# Patient Record
Sex: Female | Born: 1950
Health system: Southern US, Community
[De-identification: ages and names within clinical notes are randomized; demographics above are authoritative.]

## PROBLEM LIST (undated history)

## (undated) DIAGNOSIS — G4730 Sleep apnea, unspecified: Secondary | ICD-10-CM

## (undated) DIAGNOSIS — M1990 Unspecified osteoarthritis, unspecified site: Secondary | ICD-10-CM

## (undated) DIAGNOSIS — K760 Fatty (change of) liver, not elsewhere classified: Secondary | ICD-10-CM

## (undated) DIAGNOSIS — T1491XA Suicide attempt, initial encounter: Secondary | ICD-10-CM

## (undated) DIAGNOSIS — R55 Syncope and collapse: Secondary | ICD-10-CM

## (undated) DIAGNOSIS — E669 Obesity, unspecified: Secondary | ICD-10-CM

## (undated) DIAGNOSIS — M549 Dorsalgia, unspecified: Secondary | ICD-10-CM

## (undated) DIAGNOSIS — I10 Essential (primary) hypertension: Secondary | ICD-10-CM

## (undated) DIAGNOSIS — E119 Type 2 diabetes mellitus without complications: Secondary | ICD-10-CM

## (undated) DIAGNOSIS — C50919 Malignant neoplasm of unspecified site of unspecified female breast: Secondary | ICD-10-CM

## (undated) DIAGNOSIS — J449 Chronic obstructive pulmonary disease, unspecified: Secondary | ICD-10-CM

## (undated) DIAGNOSIS — J984 Other disorders of lung: Secondary | ICD-10-CM

## (undated) DIAGNOSIS — R7309 Other abnormal glucose: Secondary | ICD-10-CM

## (undated) DIAGNOSIS — H9209 Otalgia, unspecified ear: Secondary | ICD-10-CM

## (undated) DIAGNOSIS — C7800 Secondary malignant neoplasm of unspecified lung: Secondary | ICD-10-CM

## (undated) HISTORY — DX: Essential (primary) hypertension: I10

## (undated) HISTORY — PX: CARDIAC CATHETERIZATION: SHX172

## (undated) HISTORY — PX: CHOLECYSTECTOMY: SHX55

## (undated) HISTORY — DX: Malignant neoplasm of unspecified site of unspecified female breast: C50.919

## (undated) HISTORY — PX: TUBAL LIGATION: SHX77

## (undated) HISTORY — DX: Other abnormal glucose: R73.09

## (undated) HISTORY — DX: Suicide attempt, initial encounter: T14.91XA

## (undated) HISTORY — DX: Dorsalgia, unspecified: M54.9

## (undated) HISTORY — DX: Sleep apnea, unspecified: G47.30

## (undated) HISTORY — DX: Obesity, unspecified: E66.9

## (undated) HISTORY — DX: Other disorders of lung: J98.4

## (undated) HISTORY — DX: Fatty (change of) liver, not elsewhere classified: K76.0

## (undated) HISTORY — DX: Syncope and collapse: R55

## (undated) HISTORY — DX: Type 2 diabetes mellitus without complications: E11.9

## (undated) HISTORY — DX: Unspecified osteoarthritis, unspecified site: M19.90

---

## 1994-08-18 DIAGNOSIS — T1491XA Suicide attempt, initial encounter: Secondary | ICD-10-CM

## 1994-08-18 HISTORY — DX: Suicide attempt, initial encounter: T14.91XA

## 1997-11-16 ENCOUNTER — Encounter: Admission: RE | Admit: 1997-11-16 | Discharge: 1997-11-16 | Payer: Self-pay | Admitting: Family Medicine

## 1997-12-13 ENCOUNTER — Encounter: Admission: RE | Admit: 1997-12-13 | Discharge: 1997-12-13 | Payer: Self-pay | Admitting: Family Medicine

## 1998-01-25 ENCOUNTER — Encounter: Admission: RE | Admit: 1998-01-25 | Discharge: 1998-01-25 | Payer: Self-pay | Admitting: Sports Medicine

## 1998-02-09 ENCOUNTER — Encounter: Admission: RE | Admit: 1998-02-09 | Discharge: 1998-02-09 | Payer: Self-pay | Admitting: Family Medicine

## 1998-03-15 ENCOUNTER — Encounter: Admission: RE | Admit: 1998-03-15 | Discharge: 1998-03-15 | Payer: Self-pay | Admitting: Family Medicine

## 1998-03-16 ENCOUNTER — Other Ambulatory Visit: Admission: RE | Admit: 1998-03-16 | Discharge: 1998-03-16 | Payer: Self-pay | Admitting: *Deleted

## 1998-03-16 ENCOUNTER — Encounter: Admission: RE | Admit: 1998-03-16 | Discharge: 1998-03-16 | Payer: Self-pay | Admitting: Family Medicine

## 1998-03-30 ENCOUNTER — Encounter: Admission: RE | Admit: 1998-03-30 | Discharge: 1998-03-30 | Payer: Self-pay | Admitting: Family Medicine

## 1998-04-06 ENCOUNTER — Encounter: Admission: RE | Admit: 1998-04-06 | Discharge: 1998-04-06 | Payer: Self-pay | Admitting: Family Medicine

## 1998-04-16 ENCOUNTER — Encounter: Admission: RE | Admit: 1998-04-16 | Discharge: 1998-04-16 | Payer: Self-pay | Admitting: Family Medicine

## 1998-04-25 ENCOUNTER — Encounter: Admission: RE | Admit: 1998-04-25 | Discharge: 1998-04-25 | Payer: Self-pay | Admitting: Family Medicine

## 1998-07-11 ENCOUNTER — Encounter: Admission: RE | Admit: 1998-07-11 | Discharge: 1998-07-11 | Payer: Self-pay | Admitting: Family Medicine

## 1998-07-17 ENCOUNTER — Encounter: Admission: RE | Admit: 1998-07-17 | Discharge: 1998-07-17 | Payer: Self-pay | Admitting: Sports Medicine

## 1998-07-27 ENCOUNTER — Encounter: Admission: RE | Admit: 1998-07-27 | Discharge: 1998-07-27 | Payer: Self-pay | Admitting: Family Medicine

## 1998-11-05 ENCOUNTER — Encounter: Admission: RE | Admit: 1998-11-05 | Discharge: 1998-11-05 | Payer: Self-pay | Admitting: Sports Medicine

## 1998-11-19 ENCOUNTER — Encounter: Admission: RE | Admit: 1998-11-19 | Discharge: 1998-11-19 | Payer: Self-pay | Admitting: Family Medicine

## 1998-12-26 ENCOUNTER — Emergency Department (HOSPITAL_COMMUNITY): Admission: EM | Admit: 1998-12-26 | Discharge: 1998-12-26 | Payer: Self-pay

## 1999-03-26 ENCOUNTER — Encounter: Admission: RE | Admit: 1999-03-26 | Discharge: 1999-03-26 | Payer: Self-pay | Admitting: Family Medicine

## 1999-04-03 ENCOUNTER — Ambulatory Visit (HOSPITAL_COMMUNITY): Admission: RE | Admit: 1999-04-03 | Discharge: 1999-04-03 | Payer: Self-pay | Admitting: *Deleted

## 1999-05-03 ENCOUNTER — Emergency Department (HOSPITAL_COMMUNITY): Admission: EM | Admit: 1999-05-03 | Discharge: 1999-05-03 | Payer: Self-pay | Admitting: Emergency Medicine

## 2000-05-28 ENCOUNTER — Encounter: Payer: Self-pay | Admitting: Internal Medicine

## 2000-05-28 ENCOUNTER — Encounter: Admission: RE | Admit: 2000-05-28 | Discharge: 2000-05-28 | Payer: Self-pay | Admitting: Internal Medicine

## 2000-06-23 ENCOUNTER — Encounter: Payer: Self-pay | Admitting: Neurosurgery

## 2000-06-23 ENCOUNTER — Ambulatory Visit (HOSPITAL_COMMUNITY): Admission: RE | Admit: 2000-06-23 | Discharge: 2000-06-23 | Payer: Self-pay | Admitting: Neurosurgery

## 2000-07-30 ENCOUNTER — Ambulatory Visit (HOSPITAL_COMMUNITY): Admission: RE | Admit: 2000-07-30 | Discharge: 2000-07-30 | Payer: Self-pay | Admitting: Neurosurgery

## 2000-12-02 ENCOUNTER — Encounter: Admission: RE | Admit: 2000-12-02 | Discharge: 2001-03-02 | Payer: Self-pay | Admitting: Neurology

## 2000-12-19 ENCOUNTER — Emergency Department (HOSPITAL_COMMUNITY): Admission: EM | Admit: 2000-12-19 | Discharge: 2000-12-19 | Payer: Self-pay | Admitting: Emergency Medicine

## 2001-03-08 ENCOUNTER — Encounter: Admission: RE | Admit: 2001-03-08 | Discharge: 2001-06-06 | Payer: Self-pay | Admitting: Neurology

## 2001-03-11 ENCOUNTER — Inpatient Hospital Stay (HOSPITAL_COMMUNITY): Admission: AD | Admit: 2001-03-11 | Discharge: 2001-03-11 | Payer: Self-pay | Admitting: Obstetrics & Gynecology

## 2001-03-15 ENCOUNTER — Encounter: Payer: Self-pay | Admitting: Obstetrics & Gynecology

## 2001-03-15 ENCOUNTER — Encounter: Admission: RE | Admit: 2001-03-15 | Discharge: 2001-03-15 | Payer: Self-pay | Admitting: Obstetrics & Gynecology

## 2001-03-19 ENCOUNTER — Encounter: Admission: RE | Admit: 2001-03-19 | Discharge: 2001-03-19 | Payer: Self-pay | Admitting: General Surgery

## 2001-03-19 ENCOUNTER — Encounter: Payer: Self-pay | Admitting: General Surgery

## 2001-03-23 ENCOUNTER — Ambulatory Visit (HOSPITAL_BASED_OUTPATIENT_CLINIC_OR_DEPARTMENT_OTHER): Admission: RE | Admit: 2001-03-23 | Discharge: 2001-03-23 | Payer: Self-pay | Admitting: General Surgery

## 2001-03-23 ENCOUNTER — Encounter (INDEPENDENT_AMBULATORY_CARE_PROVIDER_SITE_OTHER): Payer: Self-pay | Admitting: *Deleted

## 2001-06-04 ENCOUNTER — Encounter: Payer: Self-pay | Admitting: Emergency Medicine

## 2001-06-04 ENCOUNTER — Emergency Department (HOSPITAL_COMMUNITY): Admission: EM | Admit: 2001-06-04 | Discharge: 2001-06-04 | Payer: Self-pay | Admitting: Emergency Medicine

## 2001-06-28 ENCOUNTER — Other Ambulatory Visit: Admission: RE | Admit: 2001-06-28 | Discharge: 2001-06-28 | Payer: Self-pay | Admitting: *Deleted

## 2001-10-05 ENCOUNTER — Emergency Department (HOSPITAL_COMMUNITY): Admission: EM | Admit: 2001-10-05 | Discharge: 2001-10-05 | Payer: Self-pay | Admitting: *Deleted

## 2001-10-05 ENCOUNTER — Encounter: Payer: Self-pay | Admitting: *Deleted

## 2001-10-11 ENCOUNTER — Encounter: Payer: Self-pay | Admitting: Internal Medicine

## 2001-10-12 ENCOUNTER — Observation Stay (HOSPITAL_COMMUNITY): Admission: RE | Admit: 2001-10-12 | Discharge: 2001-10-12 | Payer: Self-pay | Admitting: Internal Medicine

## 2001-10-14 ENCOUNTER — Inpatient Hospital Stay (HOSPITAL_COMMUNITY): Admission: EM | Admit: 2001-10-14 | Discharge: 2001-10-16 | Payer: Self-pay | Admitting: Emergency Medicine

## 2001-10-14 ENCOUNTER — Encounter: Payer: Self-pay | Admitting: Emergency Medicine

## 2001-10-21 ENCOUNTER — Encounter: Admission: RE | Admit: 2001-10-21 | Discharge: 2001-10-21 | Payer: Self-pay

## 2001-10-25 ENCOUNTER — Encounter: Admission: RE | Admit: 2001-10-25 | Discharge: 2001-10-25 | Payer: Self-pay | Admitting: Cardiology

## 2001-10-27 ENCOUNTER — Ambulatory Visit (HOSPITAL_COMMUNITY): Admission: RE | Admit: 2001-10-27 | Discharge: 2001-10-27 | Payer: Self-pay

## 2001-10-28 ENCOUNTER — Emergency Department (HOSPITAL_COMMUNITY): Admission: EM | Admit: 2001-10-28 | Discharge: 2001-10-28 | Payer: Self-pay | Admitting: Emergency Medicine

## 2001-10-29 ENCOUNTER — Ambulatory Visit (HOSPITAL_COMMUNITY): Admission: RE | Admit: 2001-10-29 | Discharge: 2001-10-29 | Payer: Self-pay | Admitting: Internal Medicine

## 2001-10-29 ENCOUNTER — Encounter: Payer: Self-pay | Admitting: Internal Medicine

## 2001-10-29 ENCOUNTER — Encounter: Admission: RE | Admit: 2001-10-29 | Discharge: 2001-10-29 | Payer: Self-pay | Admitting: Internal Medicine

## 2001-11-01 ENCOUNTER — Ambulatory Visit (HOSPITAL_BASED_OUTPATIENT_CLINIC_OR_DEPARTMENT_OTHER): Admission: RE | Admit: 2001-11-01 | Discharge: 2001-11-01 | Payer: Self-pay | Admitting: Internal Medicine

## 2001-11-03 ENCOUNTER — Encounter: Admission: RE | Admit: 2001-11-03 | Discharge: 2001-11-03 | Payer: Self-pay | Admitting: Internal Medicine

## 2001-11-08 ENCOUNTER — Encounter: Admission: RE | Admit: 2001-11-08 | Discharge: 2001-11-08 | Payer: Self-pay | Admitting: Internal Medicine

## 2001-11-15 ENCOUNTER — Encounter: Payer: Self-pay | Admitting: Emergency Medicine

## 2001-11-15 ENCOUNTER — Emergency Department (HOSPITAL_COMMUNITY): Admission: EM | Admit: 2001-11-15 | Discharge: 2001-11-16 | Payer: Self-pay | Admitting: Emergency Medicine

## 2001-11-23 ENCOUNTER — Ambulatory Visit (HOSPITAL_COMMUNITY): Admission: RE | Admit: 2001-11-23 | Discharge: 2001-11-23 | Payer: Self-pay | Admitting: Otolaryngology

## 2001-11-23 ENCOUNTER — Encounter: Payer: Self-pay | Admitting: Otolaryngology

## 2001-11-24 ENCOUNTER — Encounter: Admission: RE | Admit: 2001-11-24 | Discharge: 2001-11-24 | Payer: Self-pay

## 2001-11-29 ENCOUNTER — Encounter: Admission: RE | Admit: 2001-11-29 | Discharge: 2001-11-29 | Payer: Self-pay | Admitting: *Deleted

## 2001-12-14 ENCOUNTER — Encounter: Admission: RE | Admit: 2001-12-14 | Discharge: 2001-12-14 | Payer: Self-pay

## 2001-12-16 ENCOUNTER — Encounter: Payer: Self-pay | Admitting: Internal Medicine

## 2001-12-16 ENCOUNTER — Ambulatory Visit (HOSPITAL_COMMUNITY): Admission: RE | Admit: 2001-12-16 | Discharge: 2001-12-16 | Payer: Self-pay | Admitting: Internal Medicine

## 2002-01-16 DIAGNOSIS — K760 Fatty (change of) liver, not elsewhere classified: Secondary | ICD-10-CM

## 2002-01-16 HISTORY — DX: Fatty (change of) liver, not elsewhere classified: K76.0

## 2002-01-26 ENCOUNTER — Encounter: Admission: RE | Admit: 2002-01-26 | Discharge: 2002-01-26 | Payer: Self-pay | Admitting: Internal Medicine

## 2002-01-28 ENCOUNTER — Encounter: Payer: Self-pay | Admitting: Internal Medicine

## 2002-01-28 ENCOUNTER — Ambulatory Visit (HOSPITAL_COMMUNITY): Admission: RE | Admit: 2002-01-28 | Discharge: 2002-01-28 | Payer: Self-pay | Admitting: Internal Medicine

## 2002-02-11 ENCOUNTER — Encounter: Admission: RE | Admit: 2002-02-11 | Discharge: 2002-02-11 | Payer: Self-pay | Admitting: Family Medicine

## 2002-02-28 ENCOUNTER — Encounter: Admission: RE | Admit: 2002-02-28 | Discharge: 2002-02-28 | Payer: Self-pay | Admitting: Family Medicine

## 2002-03-17 ENCOUNTER — Encounter: Admission: RE | Admit: 2002-03-17 | Discharge: 2002-03-17 | Payer: Self-pay | Admitting: Family Medicine

## 2002-03-22 ENCOUNTER — Encounter: Admission: RE | Admit: 2002-03-22 | Discharge: 2002-03-22 | Payer: Self-pay | Admitting: Family Medicine

## 2002-04-05 ENCOUNTER — Encounter: Admission: RE | Admit: 2002-04-05 | Discharge: 2002-04-05 | Payer: Self-pay | Admitting: Family Medicine

## 2002-04-24 ENCOUNTER — Emergency Department (HOSPITAL_COMMUNITY): Admission: EM | Admit: 2002-04-24 | Discharge: 2002-04-24 | Payer: Self-pay | Admitting: Emergency Medicine

## 2002-04-26 ENCOUNTER — Encounter: Admission: RE | Admit: 2002-04-26 | Discharge: 2002-04-26 | Payer: Self-pay | Admitting: *Deleted

## 2002-04-27 ENCOUNTER — Encounter: Admission: RE | Admit: 2002-04-27 | Discharge: 2002-04-27 | Payer: Self-pay | Admitting: Family Medicine

## 2002-05-09 ENCOUNTER — Encounter (INDEPENDENT_AMBULATORY_CARE_PROVIDER_SITE_OTHER): Payer: Self-pay | Admitting: Specialist

## 2002-05-09 ENCOUNTER — Ambulatory Visit (HOSPITAL_COMMUNITY): Admission: RE | Admit: 2002-05-09 | Discharge: 2002-05-09 | Payer: Self-pay | Admitting: Obstetrics and Gynecology

## 2002-05-13 ENCOUNTER — Encounter: Admission: RE | Admit: 2002-05-13 | Discharge: 2002-05-13 | Payer: Self-pay | Admitting: Family Medicine

## 2002-05-24 ENCOUNTER — Encounter: Admission: RE | Admit: 2002-05-24 | Discharge: 2002-05-24 | Payer: Self-pay | Admitting: Sports Medicine

## 2002-05-26 ENCOUNTER — Encounter: Admission: RE | Admit: 2002-05-26 | Discharge: 2002-05-26 | Payer: Self-pay | Admitting: Obstetrics and Gynecology

## 2002-06-03 ENCOUNTER — Encounter: Admission: RE | Admit: 2002-06-03 | Discharge: 2002-06-03 | Payer: Self-pay | Admitting: Family Medicine

## 2002-07-04 ENCOUNTER — Encounter: Admission: RE | Admit: 2002-07-04 | Discharge: 2002-07-04 | Payer: Self-pay | Admitting: Family Medicine

## 2002-07-25 ENCOUNTER — Encounter: Admission: RE | Admit: 2002-07-25 | Discharge: 2002-10-23 | Payer: Self-pay

## 2002-07-31 ENCOUNTER — Encounter: Admission: RE | Admit: 2002-07-31 | Discharge: 2002-07-31 | Payer: Self-pay | Admitting: Family Medicine

## 2002-08-22 ENCOUNTER — Encounter: Admission: RE | Admit: 2002-08-22 | Discharge: 2002-08-22 | Payer: Self-pay | Admitting: Family Medicine

## 2002-10-13 ENCOUNTER — Encounter: Admission: RE | Admit: 2002-10-13 | Discharge: 2002-10-13 | Payer: Self-pay | Admitting: Family Medicine

## 2002-11-09 ENCOUNTER — Ambulatory Visit (HOSPITAL_BASED_OUTPATIENT_CLINIC_OR_DEPARTMENT_OTHER): Admission: RE | Admit: 2002-11-09 | Discharge: 2002-11-09 | Payer: Self-pay | Admitting: *Deleted

## 2002-12-07 ENCOUNTER — Encounter: Admission: RE | Admit: 2002-12-07 | Discharge: 2002-12-07 | Payer: Self-pay | Admitting: Family Medicine

## 2002-12-08 ENCOUNTER — Encounter: Admission: RE | Admit: 2002-12-08 | Discharge: 2002-12-08 | Payer: Self-pay | Admitting: Family Medicine

## 2003-01-06 ENCOUNTER — Encounter: Admission: RE | Admit: 2003-01-06 | Discharge: 2003-01-06 | Payer: Self-pay | Admitting: Family Medicine

## 2003-01-30 ENCOUNTER — Encounter: Admission: RE | Admit: 2003-01-30 | Discharge: 2003-01-30 | Payer: Self-pay | Admitting: Family Medicine

## 2003-03-02 ENCOUNTER — Encounter: Admission: RE | Admit: 2003-03-02 | Discharge: 2003-03-02 | Payer: Self-pay | Admitting: Sports Medicine

## 2003-03-02 ENCOUNTER — Encounter: Admission: RE | Admit: 2003-03-02 | Discharge: 2003-03-02 | Payer: Self-pay | Admitting: Family Medicine

## 2003-03-02 ENCOUNTER — Encounter: Payer: Self-pay | Admitting: Sports Medicine

## 2003-03-18 ENCOUNTER — Emergency Department (HOSPITAL_COMMUNITY): Admission: EM | Admit: 2003-03-18 | Discharge: 2003-03-18 | Payer: Self-pay | Admitting: Emergency Medicine

## 2003-03-30 ENCOUNTER — Encounter: Admission: RE | Admit: 2003-03-30 | Discharge: 2003-03-30 | Payer: Self-pay | Admitting: Family Medicine

## 2003-03-31 ENCOUNTER — Encounter (INDEPENDENT_AMBULATORY_CARE_PROVIDER_SITE_OTHER): Payer: Self-pay

## 2003-03-31 ENCOUNTER — Encounter (INDEPENDENT_AMBULATORY_CARE_PROVIDER_SITE_OTHER): Payer: Self-pay | Admitting: *Deleted

## 2003-03-31 ENCOUNTER — Other Ambulatory Visit: Admission: RE | Admit: 2003-03-31 | Discharge: 2003-03-31 | Payer: Self-pay | Admitting: Family Medicine

## 2003-03-31 ENCOUNTER — Encounter: Admission: RE | Admit: 2003-03-31 | Discharge: 2003-03-31 | Payer: Self-pay | Admitting: Family Medicine

## 2003-04-27 ENCOUNTER — Encounter: Admission: RE | Admit: 2003-04-27 | Discharge: 2003-04-27 | Payer: Self-pay | Admitting: Sports Medicine

## 2003-05-04 ENCOUNTER — Encounter: Admission: RE | Admit: 2003-05-04 | Discharge: 2003-05-04 | Payer: Self-pay | Admitting: Family Medicine

## 2003-06-01 ENCOUNTER — Encounter: Admission: RE | Admit: 2003-06-01 | Discharge: 2003-06-01 | Payer: Self-pay | Admitting: Sports Medicine

## 2003-06-19 ENCOUNTER — Encounter: Admission: RE | Admit: 2003-06-19 | Discharge: 2003-06-19 | Payer: Self-pay | Admitting: Family Medicine

## 2003-07-06 ENCOUNTER — Encounter: Admission: RE | Admit: 2003-07-06 | Discharge: 2003-07-06 | Payer: Self-pay | Admitting: Family Medicine

## 2003-09-02 ENCOUNTER — Emergency Department (HOSPITAL_COMMUNITY): Admission: EM | Admit: 2003-09-02 | Discharge: 2003-09-02 | Payer: Self-pay | Admitting: Emergency Medicine

## 2003-09-11 ENCOUNTER — Emergency Department (HOSPITAL_COMMUNITY): Admission: EM | Admit: 2003-09-11 | Discharge: 2003-09-12 | Payer: Self-pay | Admitting: Emergency Medicine

## 2003-09-28 ENCOUNTER — Encounter: Admission: RE | Admit: 2003-09-28 | Discharge: 2003-09-28 | Payer: Self-pay | Admitting: Sports Medicine

## 2003-10-26 ENCOUNTER — Encounter: Admission: RE | Admit: 2003-10-26 | Discharge: 2003-10-26 | Payer: Self-pay | Admitting: Family Medicine

## 2003-11-23 ENCOUNTER — Encounter: Admission: RE | Admit: 2003-11-23 | Discharge: 2003-11-23 | Payer: Self-pay | Admitting: Family Medicine

## 2003-12-28 ENCOUNTER — Encounter: Admission: RE | Admit: 2003-12-28 | Discharge: 2003-12-28 | Payer: Self-pay | Admitting: Sports Medicine

## 2004-01-29 ENCOUNTER — Emergency Department (HOSPITAL_COMMUNITY): Admission: EM | Admit: 2004-01-29 | Discharge: 2004-01-30 | Payer: Self-pay

## 2004-02-28 ENCOUNTER — Encounter: Admission: RE | Admit: 2004-02-28 | Discharge: 2004-02-28 | Payer: Self-pay | Admitting: Family Medicine

## 2004-02-28 ENCOUNTER — Ambulatory Visit (HOSPITAL_COMMUNITY): Admission: RE | Admit: 2004-02-28 | Discharge: 2004-02-28 | Payer: Self-pay | Admitting: Family Medicine

## 2004-04-12 ENCOUNTER — Encounter: Payer: Self-pay | Admitting: Cardiology

## 2004-04-18 ENCOUNTER — Ambulatory Visit: Payer: Self-pay | Admitting: Family Medicine

## 2004-06-27 ENCOUNTER — Ambulatory Visit: Payer: Self-pay | Admitting: Family Medicine

## 2004-09-26 ENCOUNTER — Ambulatory Visit: Payer: Self-pay | Admitting: Family Medicine

## 2004-10-16 ENCOUNTER — Encounter (INDEPENDENT_AMBULATORY_CARE_PROVIDER_SITE_OTHER): Payer: Self-pay | Admitting: *Deleted

## 2004-10-16 LAB — CONVERTED CEMR LAB

## 2004-10-18 ENCOUNTER — Encounter: Admission: RE | Admit: 2004-10-18 | Discharge: 2004-10-18 | Payer: Self-pay | Admitting: Family Medicine

## 2004-11-15 ENCOUNTER — Ambulatory Visit: Payer: Self-pay | Admitting: Family Medicine

## 2004-11-15 ENCOUNTER — Encounter (INDEPENDENT_AMBULATORY_CARE_PROVIDER_SITE_OTHER): Payer: Self-pay | Admitting: *Deleted

## 2004-11-21 ENCOUNTER — Ambulatory Visit: Payer: Self-pay | Admitting: Family Medicine

## 2004-11-21 ENCOUNTER — Encounter: Admission: RE | Admit: 2004-11-21 | Discharge: 2004-11-21 | Payer: Self-pay | Admitting: Family Medicine

## 2004-12-12 ENCOUNTER — Ambulatory Visit: Payer: Self-pay | Admitting: Family Medicine

## 2005-01-09 ENCOUNTER — Ambulatory Visit: Payer: Self-pay | Admitting: Family Medicine

## 2005-02-20 ENCOUNTER — Ambulatory Visit: Payer: Self-pay | Admitting: Family Medicine

## 2005-03-06 ENCOUNTER — Ambulatory Visit: Payer: Self-pay | Admitting: Family Medicine

## 2005-03-27 ENCOUNTER — Ambulatory Visit: Payer: Self-pay | Admitting: Family Medicine

## 2005-04-17 ENCOUNTER — Ambulatory Visit: Payer: Self-pay | Admitting: Family Medicine

## 2005-05-08 ENCOUNTER — Ambulatory Visit: Payer: Self-pay | Admitting: Family Medicine

## 2005-05-22 ENCOUNTER — Ambulatory Visit: Payer: Self-pay | Admitting: Family Medicine

## 2005-05-29 ENCOUNTER — Ambulatory Visit: Payer: Self-pay | Admitting: Family Medicine

## 2005-07-20 ENCOUNTER — Emergency Department (HOSPITAL_COMMUNITY): Admission: AD | Admit: 2005-07-20 | Discharge: 2005-07-20 | Payer: Self-pay | Admitting: Emergency Medicine

## 2005-09-04 ENCOUNTER — Ambulatory Visit: Payer: Self-pay | Admitting: Family Medicine

## 2005-10-16 ENCOUNTER — Ambulatory Visit: Payer: Self-pay | Admitting: Family Medicine

## 2005-11-06 ENCOUNTER — Ambulatory Visit: Payer: Self-pay | Admitting: Family Medicine

## 2005-12-02 ENCOUNTER — Ambulatory Visit: Payer: Self-pay | Admitting: Cardiology

## 2005-12-03 ENCOUNTER — Ambulatory Visit: Payer: Self-pay | Admitting: Family Medicine

## 2005-12-03 ENCOUNTER — Inpatient Hospital Stay (HOSPITAL_COMMUNITY): Admission: EM | Admit: 2005-12-03 | Discharge: 2005-12-04 | Payer: Self-pay | Admitting: Emergency Medicine

## 2005-12-03 ENCOUNTER — Encounter: Payer: Self-pay | Admitting: Cardiovascular Disease

## 2005-12-18 ENCOUNTER — Ambulatory Visit: Payer: Self-pay | Admitting: Family Medicine

## 2006-01-22 ENCOUNTER — Ambulatory Visit: Payer: Self-pay | Admitting: Family Medicine

## 2006-03-12 ENCOUNTER — Ambulatory Visit: Payer: Self-pay | Admitting: Family Medicine

## 2006-05-14 ENCOUNTER — Ambulatory Visit: Payer: Self-pay | Admitting: Family Medicine

## 2006-05-27 ENCOUNTER — Encounter: Admission: RE | Admit: 2006-05-27 | Discharge: 2006-05-27 | Payer: Self-pay | Admitting: Sports Medicine

## 2006-07-16 ENCOUNTER — Ambulatory Visit: Payer: Self-pay | Admitting: Family Medicine

## 2006-09-10 ENCOUNTER — Ambulatory Visit: Payer: Self-pay | Admitting: Family Medicine

## 2006-09-10 LAB — CONVERTED CEMR LAB
ALT: 68 units/L — ABNORMAL HIGH (ref 0–35)
AST: 33 units/L (ref 0–37)
Albumin: 4.2 g/dL (ref 3.5–5.2)
Alkaline Phosphatase: 87 units/L (ref 39–117)
BUN: 15 mg/dL (ref 6–23)
CO2: 27 meq/L (ref 19–32)
Calcium: 9.6 mg/dL (ref 8.4–10.5)
Chloride: 102 meq/L (ref 96–112)
Creatinine, Ser: 0.86 mg/dL (ref 0.40–1.20)
Glucose, Bld: 106 mg/dL — ABNORMAL HIGH (ref 70–99)
Potassium: 4.3 meq/L (ref 3.5–5.3)
Sodium: 141 meq/L (ref 135–145)
Total Bilirubin: 0.3 mg/dL (ref 0.3–1.2)
Total Protein: 7.3 g/dL (ref 6.0–8.3)

## 2006-10-15 ENCOUNTER — Ambulatory Visit: Payer: Self-pay | Admitting: Family Medicine

## 2006-10-15 DIAGNOSIS — G4730 Sleep apnea, unspecified: Secondary | ICD-10-CM | POA: Insufficient documentation

## 2006-10-15 DIAGNOSIS — F329 Major depressive disorder, single episode, unspecified: Secondary | ICD-10-CM | POA: Insufficient documentation

## 2006-10-15 DIAGNOSIS — F603 Borderline personality disorder: Secondary | ICD-10-CM | POA: Insufficient documentation

## 2006-10-15 DIAGNOSIS — M48061 Spinal stenosis, lumbar region without neurogenic claudication: Secondary | ICD-10-CM | POA: Insufficient documentation

## 2006-10-15 DIAGNOSIS — F411 Generalized anxiety disorder: Secondary | ICD-10-CM | POA: Insufficient documentation

## 2006-10-15 DIAGNOSIS — I10 Essential (primary) hypertension: Secondary | ICD-10-CM | POA: Insufficient documentation

## 2006-10-15 DIAGNOSIS — M479 Spondylosis, unspecified: Secondary | ICD-10-CM | POA: Insufficient documentation

## 2006-10-15 DIAGNOSIS — K219 Gastro-esophageal reflux disease without esophagitis: Secondary | ICD-10-CM | POA: Insufficient documentation

## 2006-10-16 ENCOUNTER — Encounter (INDEPENDENT_AMBULATORY_CARE_PROVIDER_SITE_OTHER): Payer: Self-pay | Admitting: *Deleted

## 2006-11-11 ENCOUNTER — Telehealth: Payer: Self-pay | Admitting: *Deleted

## 2006-11-16 ENCOUNTER — Telehealth (INDEPENDENT_AMBULATORY_CARE_PROVIDER_SITE_OTHER): Payer: Self-pay | Admitting: *Deleted

## 2006-12-24 ENCOUNTER — Ambulatory Visit: Payer: Self-pay | Admitting: Family Medicine

## 2006-12-24 DIAGNOSIS — R7401 Elevation of levels of liver transaminase levels: Secondary | ICD-10-CM | POA: Insufficient documentation

## 2006-12-24 DIAGNOSIS — R740 Nonspecific elevation of levels of transaminase and lactic acid dehydrogenase [LDH]: Secondary | ICD-10-CM

## 2006-12-24 DIAGNOSIS — R7402 Elevation of levels of lactic acid dehydrogenase (LDH): Secondary | ICD-10-CM | POA: Insufficient documentation

## 2007-02-04 ENCOUNTER — Encounter: Payer: Self-pay | Admitting: Family Medicine

## 2007-02-18 ENCOUNTER — Ambulatory Visit: Payer: Self-pay | Admitting: Family Medicine

## 2007-02-18 LAB — CONVERTED CEMR LAB
ALT: 47 units/L — ABNORMAL HIGH (ref 0–35)
AST: 26 units/L (ref 0–37)
Albumin: 4.4 g/dL (ref 3.5–5.2)
Alkaline Phosphatase: 96 units/L (ref 39–117)
BUN: 20 mg/dL (ref 6–23)
CO2: 27 meq/L (ref 19–32)
Calcium: 9.9 mg/dL (ref 8.4–10.5)
Chloride: 104 meq/L (ref 96–112)
Creatinine, Ser: 0.97 mg/dL (ref 0.40–1.20)
Glucose, Bld: 100 mg/dL — ABNORMAL HIGH (ref 70–99)
HCT: 46 % (ref 36.0–46.0)
Hemoglobin: 15.2 g/dL — ABNORMAL HIGH (ref 12.0–15.0)
MCHC: 33 g/dL (ref 30.0–36.0)
MCV: 88.8 fL (ref 78.0–100.0)
Platelets: 354 10*3/uL (ref 150–400)
Potassium: 4.2 meq/L (ref 3.5–5.3)
RBC: 5.18 M/uL — ABNORMAL HIGH (ref 3.87–5.11)
RDW: 14.8 % — ABNORMAL HIGH (ref 11.5–14.0)
Sodium: 139 meq/L (ref 135–145)
Total Bilirubin: 0.4 mg/dL (ref 0.3–1.2)
Total Protein: 7.7 g/dL (ref 6.0–8.3)
WBC: 7.1 10*3/uL (ref 4.0–10.5)

## 2007-03-24 ENCOUNTER — Telehealth: Payer: Self-pay | Admitting: Family Medicine

## 2007-03-25 ENCOUNTER — Telehealth: Payer: Self-pay | Admitting: *Deleted

## 2007-04-22 ENCOUNTER — Encounter: Payer: Self-pay | Admitting: Family Medicine

## 2007-04-27 ENCOUNTER — Ambulatory Visit: Payer: Self-pay | Admitting: Family Medicine

## 2007-04-28 ENCOUNTER — Encounter: Payer: Self-pay | Admitting: Family Medicine

## 2007-04-29 ENCOUNTER — Telehealth: Payer: Self-pay | Admitting: *Deleted

## 2007-05-05 ENCOUNTER — Telehealth: Payer: Self-pay | Admitting: Family Medicine

## 2007-05-27 ENCOUNTER — Ambulatory Visit: Payer: Self-pay | Admitting: Family Medicine

## 2007-05-27 LAB — CONVERTED CEMR LAB
ALT: 53 units/L — ABNORMAL HIGH (ref 0–35)
AST: 26 units/L (ref 0–37)
Albumin: 4.2 g/dL (ref 3.5–5.2)
Alkaline Phosphatase: 79 units/L (ref 39–117)
BUN: 20 mg/dL (ref 6–23)
CO2: 28 meq/L (ref 19–32)
Calcium: 9.6 mg/dL (ref 8.4–10.5)
Chloride: 102 meq/L (ref 96–112)
Creatinine, Ser: 0.83 mg/dL (ref 0.40–1.20)
Glucose, Bld: 114 mg/dL — ABNORMAL HIGH (ref 70–99)
Potassium: 4.4 meq/L (ref 3.5–5.3)
Sodium: 142 meq/L (ref 135–145)
Total Bilirubin: 0.4 mg/dL (ref 0.3–1.2)
Total Protein: 7.5 g/dL (ref 6.0–8.3)

## 2007-06-24 ENCOUNTER — Ambulatory Visit: Payer: Self-pay | Admitting: Family Medicine

## 2007-07-22 ENCOUNTER — Ambulatory Visit: Payer: Self-pay | Admitting: Family Medicine

## 2007-07-22 DIAGNOSIS — E669 Obesity, unspecified: Secondary | ICD-10-CM | POA: Insufficient documentation

## 2007-08-26 ENCOUNTER — Ambulatory Visit: Payer: Self-pay | Admitting: Family Medicine

## 2007-09-29 ENCOUNTER — Ambulatory Visit (HOSPITAL_COMMUNITY): Admission: RE | Admit: 2007-09-29 | Discharge: 2007-09-29 | Payer: Self-pay | Admitting: Family Medicine

## 2007-09-30 ENCOUNTER — Ambulatory Visit: Payer: Self-pay | Admitting: Family Medicine

## 2007-09-30 DIAGNOSIS — R55 Syncope and collapse: Secondary | ICD-10-CM | POA: Insufficient documentation

## 2007-09-30 LAB — CONVERTED CEMR LAB
ALT: 48 units/L — ABNORMAL HIGH (ref 0–35)
AST: 26 units/L (ref 0–37)
Albumin: 4.4 g/dL (ref 3.5–5.2)
Alkaline Phosphatase: 86 units/L (ref 39–117)
BUN: 16 mg/dL (ref 6–23)
CO2: 25 meq/L (ref 19–32)
Calcium: 9.6 mg/dL (ref 8.4–10.5)
Chloride: 100 meq/L (ref 96–112)
Creatinine, Ser: 0.87 mg/dL (ref 0.40–1.20)
Glucose, Bld: 104 mg/dL — ABNORMAL HIGH (ref 70–99)
HCT: 43.7 % (ref 36.0–46.0)
Hemoglobin: 14.6 g/dL (ref 12.0–15.0)
MCHC: 33.4 g/dL (ref 30.0–36.0)
MCV: 86.9 fL (ref 78.0–100.0)
Platelets: 320 10*3/uL (ref 150–400)
Potassium: 4.2 meq/L (ref 3.5–5.3)
RBC: 5.03 M/uL (ref 3.87–5.11)
RDW: 14.2 % (ref 11.5–15.5)
Sodium: 139 meq/L (ref 135–145)
Total Bilirubin: 0.5 mg/dL (ref 0.3–1.2)
Total Protein: 7.5 g/dL (ref 6.0–8.3)
WBC: 5.8 10*3/uL (ref 4.0–10.5)

## 2007-10-04 ENCOUNTER — Encounter: Payer: Self-pay | Admitting: Family Medicine

## 2007-10-06 ENCOUNTER — Encounter: Payer: Self-pay | Admitting: Family Medicine

## 2007-10-11 ENCOUNTER — Emergency Department (HOSPITAL_COMMUNITY): Admission: EM | Admit: 2007-10-11 | Discharge: 2007-10-11 | Payer: Self-pay | Admitting: Emergency Medicine

## 2007-10-28 ENCOUNTER — Ambulatory Visit: Payer: Self-pay | Admitting: Family Medicine

## 2007-10-28 LAB — CONVERTED CEMR LAB
Cholesterol: 155 mg/dL (ref 0–200)
HDL: 62 mg/dL (ref >39)
LDL Cholesterol: 72 mg/dL (ref 0–99)
Total CHOL/HDL Ratio: 2.5
Triglycerides: 104 mg/dL (ref <150)
VLDL: 21 mg/dL (ref 0–40)

## 2007-11-25 ENCOUNTER — Ambulatory Visit: Payer: Self-pay | Admitting: Family Medicine

## 2007-11-25 DIAGNOSIS — H9209 Otalgia, unspecified ear: Secondary | ICD-10-CM | POA: Insufficient documentation

## 2007-12-28 ENCOUNTER — Telehealth: Payer: Self-pay | Admitting: *Deleted

## 2007-12-30 ENCOUNTER — Encounter: Payer: Self-pay | Admitting: *Deleted

## 2008-10-30 ENCOUNTER — Encounter: Admission: RE | Admit: 2008-10-30 | Discharge: 2008-10-30 | Payer: Self-pay | Admitting: Internal Medicine

## 2008-11-24 ENCOUNTER — Observation Stay (HOSPITAL_COMMUNITY): Admission: EM | Admit: 2008-11-24 | Discharge: 2008-11-25 | Payer: Self-pay | Admitting: Emergency Medicine

## 2008-11-24 ENCOUNTER — Ambulatory Visit: Payer: Self-pay | Admitting: Cardiology

## 2009-01-29 ENCOUNTER — Encounter
Admission: RE | Admit: 2009-01-29 | Discharge: 2009-01-29 | Payer: Self-pay | Admitting: Physical Medicine & Rehabilitation

## 2009-03-05 ENCOUNTER — Ambulatory Visit: Payer: Self-pay | Admitting: Cardiology

## 2009-03-05 ENCOUNTER — Emergency Department (HOSPITAL_COMMUNITY): Admission: EM | Admit: 2009-03-05 | Discharge: 2009-03-05 | Payer: Self-pay | Admitting: Emergency Medicine

## 2010-09-17 NOTE — Miscellaneous (Signed)
Summary: dnka/ts  Clinical Lists Changes 

## 2010-09-17 NOTE — Assessment & Plan Note (Signed)
Summary: routine visit/el   Vital Signs:  Patient Profile:   60 Years Old Female Height:     64 inches (162.56 cm) Weight:      278 pounds Temp:     98.7 degrees F Pulse rate:   71 / minute BP sitting:   149 / 91  (left arm)  Pt. in pain?   yes    Location:   back and right leg    Intensity:   9    Type:       aching and sharp  Vitals Entered ByJacki Cones RN (October 28, 2007 9:49 AM)                 Last Flex Sig:  Done. (08/19/1995 12:00:00 AM) Flex Sig Next Due:  Not Indicated Last Hemoccult Result: Done. (04/18/2002 12:00:00 AM) Hemoccult Next Due:  Not Indicated   History of Present Illness: Was in ER a week or so ago for back pain.  Better now did not get any by mouth narcotics  OBESITY (ICD-278.00) continues to slowly loose weight.  Has stopped sodas and drinking water and propel.  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  No incontinence or significant change in sensation.  Using morphine two times a day to help with mobility  HYPERTENSION Disease Monitoring   Blood pressure range:Was low when went to ER for back pain last month       Chest pain: N     Dyspnea:N Medications   Compliance: stopped her enalapril because thinks makes her feel chilled.  Is better since she stopped   Lightheadedness: N     Edema:N  ROS - as above PMH - Medications reviewed and updated in medication list.  Smoking Status noted in VS form       Current Allergies: AMOXICILLIN (AMOXICILLIN) ASPIRIN (ASPIRIN) MEPERIDINE HCL (MEPERIDINE HCL)   Social History:    Has boyfriend; adopted granddaughter, [name] (born 8/02); Sisters Douglas and leigh help with angelica; Has daughter Lattie Corns ([name]'s mom-drug abuser); has daughter Engineer, manufacturing (granddaughter Shadow); Daughter Melony killed  1996.  No tobacco, no etoh   Risk Factors:  Tobacco use:  quit    Year quit:  38 years ago    Physical Exam  General:     Well-developed,well-nourished,in no acute distress; alert,appropriate and cooperative throughout examination    Impression & Recommendations:  Problem # 1:  OBESITY (ICD-278.00) Encouraged weight loss with diet as main approach.  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  Discussed weight loss is the main treatment for this.  Will improve as she carries less weight.  No evidence of nerve compromise Orders: [facility]- Est  Level 4 (99214)   Problem # 3:  HYPERTENSION, BENIGN SYSTEMIC (ICD-401.1) Will monitor her BP off enalapril.  Will likely need restart or additon of another agent Her updated medication list for this problem includes:    Hydrochlorothiazide 25 Mg Tabs (Hydrochlorothiazide) .Marland Kitchen... Take 1 tablet by mouth every morning    Enalapril Maleate 10 Mg Tabs (Enalapril maleate) ..... Not taking for now  Orders: [facility]- Est  Level 4 (09811)   Problem # 4:  Preventive Health Care (ICD-V70.0) Discussed need for screening test particularly  colonoscopy since she has rectal bleeding she understands she could have cancer  Complete Medication List: 1)  Hydrochlorothiazide 25 Mg Tabs (Hydrochlorothiazide) .... Take 1 tablet by mouth every morning 2)  Imipramine Hcl 25 Mg Tabs (Imipramine hcl) .... 3 by mouth at bedtime 3)  Omeprazole 20 Mg Cpdr (Omeprazole) .... Take 1 capsule by mouth twice a day 4)  Triamcinolone Acetonide 0.025 % Oint (Triamcinolone acetonide) .... Apply a small amount to affected area twice a day 5)  Enalapril Maleate 10 Mg Tabs (Enalapril maleate) .... Not taking for now 6)  Oramorph Sr 15 Mg Tb12 (Morphine sulfate) .Marland Kitchen.. 1 by mouth two times a day.  should last until 11/28/07  Other Orders: Lip[facility] ([id number])   Patient  Instructions: 1)  Please schedule a follow-up appointment in 1 month. 2)  ***Check your BP 3-4 times a week and bring in the readings 3)  #######You need a Mammogram a Pap Smear and a colonoscopy you could have breast cervical or colon cancer    Prescriptions: ORAMORPH SR 15 MG  TB12 (MORPHINE SULFATE) 1 by mouth two times a day.  Should last until 11/28/07  #60 x 0   Entered and Authorized by:   Pearlean Brownie MD   Signed by:   Pearlean Brownie MD on 10/28/2007   Method used:   Print then Give to Patient   RxID:   [id number]  ]

## 2010-09-17 NOTE — Progress Notes (Signed)
Summary: requesting alternate rx   Phone Note Call from Patient Call back at Home Phone [phone]   Reason for Call: Talk to Nurse Summary of Call: pt is calling re: her prior authorization for oxycotin. Explained to pt this has been sent to [facility] and it sometimes can take a few days for medicaid to approve and send back. Pt wants to know if something else can be called in? She sts shes in a lot of pain and right now shes taking tylenol but she sts its not good for her to take tylenol Initial call taken by: ERIN LEVAN,  May 05, 2007 9:25 AM    New/Updated Medications: ORAMORPH SR 15 MG  TB12 (MORPHINE SULFATE) 1 by mouth three times a day   Prescriptions: ORAMORPH SR 15 MG  TB12 (MORPHINE SULFATE) 1 by mouth three times a day  #90 x 0   Entered and Authorized by:   Pearlean Brownie MD   Signed by:   Pearlean Brownie MD on 05/05/2007   Method used:   Print then Give to Patient   RxID:   [id number]

## 2010-09-17 NOTE — Assessment & Plan Note (Signed)
Summary: fu wp    History of Present Illness: Osteoarthritis CO worse pain especially in R leg.  Will give out on her sometimes when pain is too bad.  If has pain medication will not fall.  Did not injury anything when gave out on her and she fell 2 weeks ago.  No swelling or redness or focal pain States can't exercise to lose weight because of the pain.   Has not seen Dr Jillyn Hidden her orthopedist in several years  Hypertension No cp  or shortness of breath. Does feel lightheaded when stands quickly.  Taking HCTZ without problems  Reviewed the patient's medications.  They are updated and current.   Current Allergies: AMOXICILLIN (AMOXICILLIN) ASPIRIN (ASPIRIN) MEPERIDINE HCL (MEPERIDINE HCL)      Physical Exam  General:     Well-developed,well-nourished,in no acute distress; alert,appropriate and cooperative throughout examination. Msk:     Able to get up from chair and up on exam table without apparent pain and no assistance.  FROM of both hips with mild pain on R.  No effusion either knee.  Non inflamed varicose veins biltat L > R.  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  Will ask her to go back to her orthopedist Did not loose weight as per our agreement.  Will allow one more opportunity to follow up as per patient instructions.  If not will decrease her dosing to two times a day.   Orders: [facility]- Est  Level 4 (62694)   Problem # 2:  HYPERTENSION, BENIGN SYSTEMIC (ICD-401.1) Assessment: Unchanged  Her updated medication list for this problem includes:    Hydrochlorothiazide 25 Mg Tabs (Hydrochlorothiazide) .Marland Kitchen... Take 1 tablet by mouth every morning  Orders: [facility]- Est  Level 4 (85462)   Problem # 3:  Preventive Health Care (ICD-V70.0) Told to  patient and her daughter that she needs a colonoscpy and could have a cancer that would kill her but that we could catch it in time if she had this test.  She will consider it  Complete Medication List: 1)  Hydrochlorothiazide 25 Mg Tabs (Hydrochlorothiazide) .... Take 1 tablet by mouth every morning 2)  Imipramine Hcl 25 Mg Tabs (Imipramine hcl) .... 3 by mouth at bedtime 3)  Omeprazole 20 Mg Cpdr (Omeprazole) .... Take 1 capsule by mouth twice a day 4)  Oxycontin 10 Mg Tb12 (Oxycodone hcl) .... Take 1 tablet by mouth three times a day 5)  Triamcinolone Acetonide 0.025 % Oint (Triamcinolone acetonide) .... Apply a small amount to affected area twice a day   Patient Instructions: 1)  Please schedule a follow-up appointment in 1 month. 2)  See Dr Jillyn Hidden to see if any further treatment for pain 3)  Make appointment for a Pap Smear 4)  Need to have lost 5 lbs by next visit 5)  Youu need a colonoscopy    Prescriptions: OXYCONTIN 10 MG TB12 (OXYCODONE HCL) Take 1 tablet by mouth three times a day  #90 x 0   Entered and Authorized by:   Pearlean Brownie MD   Signed by:   Pearlean Brownie MD on 04/27/2007   Method used:   Print then Give to Patient   RxID:   [id number]  ]

## 2010-09-17 NOTE — Assessment & Plan Note (Signed)
Summary: 1 month fu/el   Vital Signs:  Patient Profile:   60 Years Old Female Height:     64 inches (162.56 cm) Weight:      290.1 pounds Temp:     98.4 degrees F Pulse rate:   62 / minute BP sitting:   149 / 86  Pt. in pain?   yes    Location:   lower back, right leg, H/A    Intensity:   8  Vitals Entered By: Altamese Dilling CMA, (May 27, 2007 8:47 AM)                  Chief Complaint:  1 mo f/u.  History of Present Illness: Osteoarthritis Still pain in hips and back.  No change.  No swelling or redness or focal pain States can't exercise to lose weight because of the pain.  Has not made an appt with Dr Jillyn Hidden her orthopedist   Hypertension No cp  or shortness of breath. Does feel lightheaded when stands quickly.  Taking HCTZ without problems.    Mental Health Not seeing any psychiatrist or counselor.  Did not get along with the last one.  Feels down but no suicidal ideation.  Mostly stress with her family situation and pain.  Is planning to find another MH provider  Reviewed the patient's medications.  They are updated and current.   Current Allergies: AMOXICILLIN (AMOXICILLIN) ASPIRIN (ASPIRIN) MEPERIDINE HCL (MEPERIDINE HCL)      Physical Exam  General:     Well-developed,well-nourished,in no acute distress; alert,appropriate and cooperative throughout examination    Impression & Recommendations:  Problem # 1:  OSTEOARTHRITIS OF SPINE, NOS (ICD-721.90) No change in situation.  Is not losing weight.  Will decrease her morphine to two times a day.  Emphasized weight loss is the key to improving her pain and mobility Orders: [facility]- Est Level  3 (36644)   Problem # 2:  HYPERTENSION, BENIGN SYSTEMIC (ICD-401.1) controlled Her updated medication list for this problem includes:    Hydrochlorothiazide 25 Mg Tabs (Hydrochlorothiazide) .Marland Kitchen... Take 1 tablet by mouth every morning  Orders: [facility] ([id number]) [facility]- Est Level  3  ([id number])   Problem # 3:  DEPRESSIVE DISORDER, NOS (ICD-311) Chronic but relatively stable.  Emphasized needs a regular mental health provider.  Continue imipramine for depression and pain Her updated medication list for this problem includes:    Imipramine Hcl 25 Mg Tabs (Imipramine hcl) .Marland KitchenMarland KitchenMarland KitchenMarland Kitchen 3 by mouth at bedtime   Complete Medication List: 1)  Hydrochlorothiazide 25 Mg Tabs (Hydrochlorothiazide) .... Take 1 tablet by mouth every morning 2)  Imipramine Hcl 25 Mg Tabs (Imipramine hcl) .... 3 by mouth at bedtime 3)  Omeprazole 20 Mg Cpdr (Omeprazole) .... Take 1 capsule by mouth twice a day 4)  Triamcinolone Acetonide 0.025 % Oint (Triamcinolone acetonide) .... Apply a small amount to affected area twice a day 5)  Oramorph Sr 15 Mg Tb12 (Morphine sulfate) .Marland Kitchen.. 1 by mouth two times a day   Patient Instructions: 1)  Please schedule a follow-up appointment in 1 month. 2)  Need follow up with Dr Jillyn Hidden 3)  Find a mental health counselor 4)  I will call you if your lab is abnormal otherwise I will discuss the results during our next visit    Prescriptions: ORAMORPH SR 15 MG  TB12 (MORPHINE SULFATE) 1 by mouth two times a day  #60 x 0   Entered and Authorized by:   Pearlean Brownie MD  Signed by:   Pearlean Brownie MD on 05/27/2007   Method used:   Handwritten   RxID:   [id number]  ]

## 2010-09-17 NOTE — Miscellaneous (Signed)
  Clinical Lists Changes Imipramine refill req was for 25 mg capsules 3 by mouth at bedtime.  Medications: Changed medication from IMIPRAMINE PAMOATE 75 MG CAPS (IMIPRAMINE PAMOATE) Take 1 capsule by mouth every night to IMIPRAMINE HCL 25 MG TABS (IMIPRAMINE HCL) 3 by mouth at bedtime

## 2010-09-17 NOTE — Progress Notes (Signed)
Summary: Refill/appt  Phone Note Call from Patient Call back at Home Phone [phone]   Summary of Call: Pt had to cancel appt for this week stating she has to fly to [place] to be with her daughter (states something is going on)...but she needs a refill on morphine. Initial call taken by: Haydee Salter,  Dec 28, 2007 11:31 AM  Follow-up for Phone Call        Will forward to MD Follow-up by: ASHA BENTON LPN,  Dec 28, 2007 11:47 AM  Additional Follow-up for Phone Call Additional follow up Details #1::        Script placed up front, pt. notified, pt. states that she will be leaving in the morning and that she doesnt know if she will be back by June 12 b/c her daughter was in a car accident and she has to take care of her granddaughter. But if she ends up staying longer she will just go to the ER in there. Additional Follow-up by: ASHA BENTON LPN,  Dec 28, 2007 1:49 PM    New/Updated Medications: ORAMORPH SR 15 MG  TB12 (MORPHINE SULFATE) 1 by mouth two times a day.  Should last until 01/28/08   Prescriptions: ORAMORPH SR 15 MG  TB12 (MORPHINE SULFATE) 1 by mouth two times a day.  Should last until 01/28/08  #60 x 0   Entered and Authorized by:   Pearlean Brownie MD   Signed by:   Pearlean Brownie MD on 12/28/2007   Method used:   Handwritten   RxID:   [id number]

## 2010-09-17 NOTE — Medication Information (Signed)
Summary: Prior Authorization  Prior Authorization   Imported By: Haydee Salter 05/13/2007 15:43:14  _____________________________________________________________________  External Attachment:    Type:   Image     Comment:   External Document

## 2010-09-17 NOTE — Consult Note (Signed)
[facility]   Imported By: Haydee Salter 02/10/2007 13:58:57  _____________________________________________________________________  External Attachment:    Type:   Image     Comment:   External Document

## 2010-09-17 NOTE — Assessment & Plan Note (Signed)
Summary: [name]   Vital Signs:  Patient Profile:   60 Years Old Female Height:     64 inches (162.56 cm) Weight:      296 pounds (134.55 kg) BMI:     50.99 Temp:     98 degrees F (36.67 degrees C) Pulse rate:   68 / minute BP sitting:   144 / 80  (right arm)  Vitals Entered By: Tomasa Rand (June 24, 2007 10:57 AM)                 Chief Complaint:  F/U and meds refills.  History of Present Illness: Osteoarthritis No change in her status still pain in hips and back.  No change.  No swelling or redness or focal pain States can't exercise to lose weight because of the pain.  Has not made an appt with Dr Jillyn Hidden her orthopedist   Obesity Trying to lose weight but hard.  Blames todays weight gain on being off her hctz.  Trying to eat mostly soups and to exercise  Hypertension No cp  or shortness of breath. Did not take her hctz for 2 days - ran out.  Mental Health Not seeing any psychiatrist or counselor.  Did not get along with the last one.  Feels down but no suicidal ideation.  Mostly stress with her family situation and pain.  Has not found another counselor  Reviewed the patient's medications.  They are updated and current.   Current Allergies: AMOXICILLIN (AMOXICILLIN) ASPIRIN (ASPIRIN) MEPERIDINE HCL (MEPERIDINE HCL)      Physical Exam  General:     Well-developed,well-nourished,in no acute distress; alert,appropriate and cooperative throughout examination Msk:     Able to get up and down from exam table easily    Impression & Recommendations:  Problem # 1:  ELEVATION, TRANSAMINASE/LDH LEVELS (ICD-790.4) Assessment: Improved  Problem # 2:  GASTROESOPHAGEAL REFLUX, NO ESOPHAGITIS (ICD-530.81) recommended that she take her PPI daily  Her updated medication list for this problem includes:    Omeprazole 20 Mg Cpdr (Omeprazole) .Marland Kitchen... 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  Will follow Her updated medication list for this problem includes:    Hydrochlorothiazide 25 Mg Tabs (Hydrochlorothiazide) .Marland Kitchen... Take 1 tablet by mouth every morning  Orders: [facility]- Est  Level 4 (60454)   Complete Medication List: 1)  Hydrochlorothiazide 25 Mg Tabs (Hydrochlorothiazide) .... Take 1 tablet by mouth every morning 2)  Imipramine Hcl 25 Mg Tabs (Imipramine hcl) .... 3 by mouth at bedtime 3)  Omeprazole 20 Mg Cpdr (Omeprazole) .... Take 1 capsule by mouth twice a day 4)  Triamcinolone Acetonide 0.025 % Oint (Triamcinolone acetonide) .... Apply a small amount to affected area twice a day 5)  Oramorph Sr 15 Mg Tb12 (Morphine sulfate) .Marland Kitchen.. 1 by mouth two times a day fill after 11/8   Patient Instructions: 1)  Please schedule a follow-up appointment in 1 month. 2)  If have not lost 5 lbs by next visit will decrease your morphine 3)  Get your pap smear and mammogram    Prescriptions: ORAMORPH SR 15 MG  TB12 (MORPHINE SULFATE) 1 by mouth two times a day Fill after 11/8  #60 x 0   Entered and  Authorized by:   Pearlean Brownie MD   Signed by:   Pearlean Brownie MD on 06/24/2007   Method used:   Print then Give to Patient   RxID:   [id number]  ]

## 2010-09-17 NOTE — Assessment & Plan Note (Signed)
Summary: HTN/FU [facility]   Vital Signs:  Patient Profile:   60 Years Old Female Height:     64 inches (162.56 cm) Weight:      282 pounds (128.18 kg) BMI:     48.58 Temp:     98 degrees F (36.67 degrees C) Pulse rate:   73 / minute BP sitting:   154 / 78  (right arm)  Pt. in pain?   no  Vitals Entered By: Tomasa Rand (August 26, 2007 9:55 AM)                  Chief Complaint:  B/P f/u.  History of Present Illness: Osteoarthritis No change in her status still pain in hips and back.  No swelling or redness or focal pain.    Obesity Eating frozen veggies and soups which is helping her lose wt.   Hypertension Taking HCTZ  and now enalapril regularly.  Not measuring at home No chest pain  or shortness of breath.  No lip swelling  Reviewed the patient's medications.  They are updated and current.    Serial Vital Signs/Assessments:  Time      Position  BP       Pulse  Resp  Temp     By                     138/80                         MARSHALL CHAMBLISS MD  Current Allergies: AMOXICILLIN (AMOXICILLIN) ASPIRIN (ASPIRIN) MEPERIDINE HCL (MEPERIDINE HCL)      Physical Exam  General:     Obese ,in no acute distress; alert,appropriate and cooperative throughout examination.  Noted 3 lb weight loss.  Able to get up and down from exam table without problems or apparent pain.    Impression & Recommendations:  Problem # 1:  OSTEOARTHRITIS OF SPINE, NOS (ICD-721.90) Stable on lowered dose of narcotics Orders: [facility]- Est Level  3 (47829)   Problem # 2:  OBESITY (ICD-278.00) slowly losing weight encouraged to continue her approach Orders: [facility]- Est Level  3 (56213)   Problem # 3:  HYPERTENSION, BENIGN SYSTEMIC (ICD-401.1) Assessment: Improved  Her updated medication list for this problem includes:    Hydrochlorothiazide 25 Mg Tabs (Hydrochlorothiazide) .Marland Kitchen... Take 1 tablet by mouth every morning    Enalapril Maleate 10 Mg Tabs (Enalapril maleate) .Marland Kitchen... Take 1  tab by mouth daily  Orders: [facility]- Est Level  3 ([id number])   Complete Medication List: 1)  Hydrochlorothiazide 25 Mg Tabs (Hydrochlorothiazide) .... Take 1 tablet by mouth every morning 2)  Imipramine Hcl 25 Mg Tabs (Imipramine hcl) .... 3 by mouth at bedtime 3)  Omeprazole 20 Mg Cpdr (Omeprazole) .... Take 1 capsule by mouth twice a day 4)  Triamcinolone Acetonide 0.025 % Oint (Triamcinolone acetonide) .... Apply a small amount to affected area twice a day 5)  Enalapril Maleate 10 Mg Tabs (Enalapril maleate) .... Take 1 tab by mouth daily 6)  Oramorph Sr 15 Mg Tb12 (Morphine sulfate) .Marland Kitchen.. 1 by mouth two times a day   Patient Instructions: 1)  Please schedule a follow-up appointment in 1 month. 2)  Check BP whenever have chance  3)  Continue to work on weight loss doing very well.  Aim for 5-10 lbs next visit 4)  Mammogram and Pap smear and Colonscopy - You need all of these to make sure you  don't have cancer    Prescriptions: ORAMORPH SR 15 MG  TB12 (MORPHINE SULFATE) 1 by mouth two times a day  #60 x 0   Entered and Authorized by:   Pearlean Brownie MD   Signed by:   Pearlean Brownie MD on 08/26/2007   Method used:   Print then Give to Patient   RxID:   [id number] ENALAPRIL MALEATE 10 MG  TABS (ENALAPRIL MALEATE) Take 1 tab by mouth daily  #30 x 6   Entered and Authorized by:   Pearlean Brownie MD   Signed by:   Pearlean Brownie MD on 08/26/2007   Method used:   Electronically sent to ...       [facility]  [address] [id number]       [address]       [place], [place]  [address]       Ph: [phone] or [phone]       Fax: [phone]   RxID:   [id number]  ]

## 2010-09-17 NOTE — Assessment & Plan Note (Signed)
Summary: 2 MONTH FU WK   Vital Signs:  Patient Profile:   60 Years Old Female Height:     64 inches (162.56 cm) Weight:      285 pounds (129.55 kg) BMI:     49.10 Pulse rate:   92 / minute BP sitting:   175 / 95  Pt. in pain?   no  Vitals Entered By: Tomasa Rand (Dec 24, 2006 9:02 AM)                Chief Complaint:  F/U visit.  History of Present Illness: Osteoarthritis Continued pain in back and legs.  Is working on diet and trying to exercise.   No jt swelling or redness  Hypertension No cp  or shortness of breath. Does feel lightheaded when stands quickly.  Taking HCTZ without problems  Elevated LFTS  No RUQ pain or jaundice.  Was started on Neurontin by her psychiatrist   Reviewed the patient's medications in Dr Tiajuana Amass.  They are updated and current.     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 Alert and cooperative with normal attention span and concentration. No apparent delusions, illusions, hallucinations    Impression & Recommendations:  Problem # 1:  OSTEOARTHRITIS OF SPINE, NOS (ICD-721.90) Assessment: Unchanged Continue to promote weight loss and exercise.  Reviewed that we are using the oxycontin to promote these and if not loosing weight will decrease dose.  Dr Allyne Gee psychiatry added neurontin.   [facility]- Est  Level 4 (99214)   Problem # 2:  HYPERTENSION,  BENIGN SYSTEMIC (ICD-401.1) Assessment: Deteriorated Seems to fluctuate.  She can't take BP at drug stores because can only take in left arm that is tender.  Will follow.  If still elevated will need to check labs and add another medication Orders: [facility]- Est  Level 4 (99214)   Problem # 3:  ELEVATION, TRANSAMINASE/LDH LEVELS (ICD-790.4) Assessment: Unchanged Will recheck next visit. Not clinically active Orders: [facility]- Est  Level 4 (16109)   Problem # 4:  FAMILY SITUATION (ICD-V61.9) Grand dtr [name] doing well in [place].  Her dtr is pregnant at age 11 is not using drugs any longer.    Problem # 5:  Preventive Health Care (ICD-V70.0) Discussed cancer risks of not getting her screening tests.  Problem # 6:  DEPRESSIVE DISORDER, NOS (ICD-311) Seeing Dr Allyne Gee at [facility] q 2 months   Patient Instructions: 1)  Please schedule a follow-up appointment in 2  months. 2)  Goal is to loose 1-2 lbs a week.  If do not loose 8 lbs by next visit will decrease Oxycontin to three times a day  3)  Remember Mammogram, Pap smear, colonscopy  4)  If have chance check and write down BP

## 2010-09-17 NOTE — Progress Notes (Signed)
Summary: Requesting to speak with Dr Deirdre Priest  Phone Note Call from Patient   Summary of Call: pt is requesting to speak with Dr Deirdre Priest about oxycontin - pt is upset about rx Initial call taken by: Haydee Salter,  March 25, 2007 11:48 AM  Follow-up for Phone Call        pt is calling back, she wants [place] to [facility] to verify with them that they had the rx and that they shredded the rx. She wants to speak with MD Follow-up by: Denny Peon LEVAN,  March 25, 2007 4:31 PM  Additional Follow-up for Phone Call Additional follow up Details #1::        I spoke with Rodman [facility] at [facility] they have the prescription for oxycontin but it is very soiled.    I will write another Rx and patient may pickup tomorrow Additional Follow-up by: Pearlean Brownie MD,  March 25, 2007 5:13 PM    Additional Follow-up for Phone Call Additional follow up Details #2::    Rx has been picked up Follow-up by: Sarah Bush [name] CMA,  March 26, 2007 10:14 AM  New/Updated Medications: OXYCONTIN 10 MG TB12 (OXYCODONE HCL) Take 1 tablet by mouth three times a day

## 2010-09-17 NOTE — Assessment & Plan Note (Signed)
Summary: multiple issues wp   Vital Signs:  Patient Profile:   60 Years Old Female Height:     64 inches (162.56 cm) Weight:      280.8 pounds BMI:     48.37 Temp:     98.4 degrees F oral Pulse rate:   76 / minute BP sitting:   155 / 98  (left arm)  Pt. in pain?   yes    Location:   rt. leg    Intensity:   8  Vitals Entered By: Garen Grams LPN (September 30, 2007 9:53 AM)                  Chief Complaint:  Pain in lower back and rt. leg.  History of Present Illness: Syncope Had episode standing in line at a store.  Felt lightheadedness and had pain in her leg prior to episode.  Was awake and alert in a few moments.  Has had before.  Had cath in 2007 normal.  No increase in chest pain or shortness of breath.  No change in her meds or activity.  No tongue biting or incontinence.  Leg Pain radiates from back continues as usual. Comes and goes.  No specific weakness.  No change in bowel or bladder control.    HYPERTENSION Disease Monitoring   Blood pressure range:  does not take     Chest pain: N     Dyspnea:N Medications   Compliance: taking regularly   Lightheadedness: N     Edema:N Prevention   Exercise: trying to but hard with her pain    Salt restriction:Y  Obesity Lost 2 lbs   ROS - as above PMH - Medications reviewed and updated in medication list.  Smoking Status noted in VS form        Current Allergies: AMOXICILLIN (AMOXICILLIN) ASPIRIN (ASPIRIN) MEPERIDINE HCL (MEPERIDINE HCL)  Past Medical History:    h/o suicide attempt `96,     Mild Fatty liver on U/S 6/03,     Mild Restrictive Lung Disease on PFTs 4/03,     Nl ABIs 8/03,    EGD 8/03 : normal -      Sleep study 4/04 No Apnea -     Echocardiogram 12-03-05 Normal    Cardiac Catheritization 12/04/2005 Dr Juanda Chance - Normal      Physical Exam  General:     Well-developed,well-nourished,in no acute distress; alert,appropriate and cooperative throughout examination Neck:     No deformities,  masses, or tenderness noted. Lungs:     Normal respiratory effort, chest expands symmetrically. Lungs are clear to auscultation, no crackles or wheezes. Heart:     Normal rate and regular rhythm. S1 and S2 normal without gallop, murmur, click, rub or other extra sounds. Abdomen:     Obese.  Mild epig tenderness.  No gaurding or rebound  Neurologic:     No cranial nerve deficits noted. Station and gait are normal. . Sensory, motor and coordinative functions appear intact.    Impression & Recommendations:  Problem # 1:  SYNCOPE (ICD-780.2) Assessment: New No evidence of CV disease.  Likely vasovagal with standing and pain.  Had normal cardiac evaluation in 4/07  Will observe and check labs  Orders: EKG- [facility] (EKG) [facility]- Est  Level 4 (99214) Comp Met-[facility] (14782-95621) CBC-[facility] (30865)   Problem # 2:  HYPERTENSION, BENIGN SYSTEMIC (ICD-401.1) Assessment: Deteriorated Not optimal but will hold off changing medications until blood tests and monitoring  Her updated medication list for  this problem includes:    Hydrochlorothiazide 25 Mg Tabs (Hydrochlorothiazide) .Marland Kitchen... Take 1 tablet by mouth every morning    Enalapril Maleate 10 Mg Tabs (Enalapril maleate) .Marland Kitchen... Take 1 tab by mouth daily   Problem # 3:  OBESITY (ICD-278.00) Will not lower narcotics today but discussed will in the future if weight loss does not continue  Complete Medication List: 1)  Hydrochlorothiazide 25 Mg Tabs (Hydrochlorothiazide) .... Take 1 tablet by mouth every morning 2)  Imipramine Hcl 25 Mg Tabs (Imipramine hcl) .... 3 by mouth at bedtime 3)  Omeprazole 20 Mg Cpdr (Omeprazole) .... Take 1 capsule by mouth twice a day 4)  Triamcinolone Acetonide 0.025 % Oint (Triamcinolone acetonide) .... Apply a small amount to affected area twice a day 5)  Enalapril Maleate 10 Mg Tabs (Enalapril maleate) .... Take 1 tab by mouth daily 6)  Oramorph Sr 15 Mg Tb12 (Morphine sulfate) .Marland Kitchen.. 1 by mouth two times a day.  should last  until 10/28/07   Patient Instructions: 1)  Please schedule a follow-up appointment in 1 month. 2)  If you have chest pain or severe shortness of breath go to the ER. 3)  Check your BP 3-4 times a week and bring in the readings 4)  I will call you if your lab is abnormal otherwise I will discuss the results during our next visit 5)  NO ONE SHOULD SMOKE IN YOUR HOUSE    Prescriptions: ORAMORPH SR 15 MG  TB12 (MORPHINE SULFATE) 1 by mouth two times a day.  Should last until 10/28/07  #60 x 0   Entered and Authorized by:   Pearlean Brownie MD   Signed by:   Pearlean Brownie MD on 09/30/2007   Method used:   Print then Give to Patient   RxID:   [id number]  ]

## 2010-09-17 NOTE — Assessment & Plan Note (Signed)
Summary: FU/KH   Vital Signs:  Patient Profile:   60 Years Old Female Height:     64 inches (162.56 cm) Weight:      285.50 pounds BMI:     49.18 Temp:     99.1 degrees F Pulse rate:   74 / minute BP sitting:   164 / 79  Pt. in pain?   yes    Location:   legs and back    Intensity:   4  Vitals Entered By: Dennison Nancy RN (July 22, 2007 8:46 AM)                  Chief Complaint:  Weight and BP check.  History of Present Illness: Osteoarthritis No change in her status still pain in hips and back.  No swelling or redness or focal pain.  Pain meds are helping "some"  Obesity Eating soups which is helping her loose wt.   Hypertension Taking HCTZ regularly.  Not measuring at home No chest pain  or shortness of breath.   Mental Health Using techniques she has learned relaxation etc but does not want to see counselor.  Her daughter Melene Muller and granddtr are moving and her other daughter may move soon.  This will help with her stress.  The imipramine is helping with sleep  Reviewed the patient's medications.  They are updated and current.   Current Allergies: AMOXICILLIN (AMOXICILLIN) ASPIRIN (ASPIRIN) MEPERIDINE HCL (MEPERIDINE HCL)      Physical Exam  General:     Well-developed,well-nourished,in no acute distress; alert,appropriate and cooperative throughout examination.  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  Check Bmet next visit Her updated medication list for this problem includes:    Hydrochlorothiazide 25 Mg Tabs (Hydrochlorothiazide) .Marland Kitchen... Take 1 tablet by mouth every morning    Enalapril Maleate 5 Mg Tabs (Enalapril maleate) .Marland Kitchen... 1 by mouth once  daily  Orders: [facility]- Est  Level 4 (99214)   Problem # 3:  APNEA, SLEEP (ICD-780.57) Continue weight loss  Problem # 4:  OBESITY (ICD-278.00) Praised on weight loss  Problem # 5:  ANXIETY (ICD-300.00) Assessment: Improved Continue medication and behavioral techniques Her updated medication list for this problem includes:    Imipramine Hcl 25 Mg Tabs (Imipramine hcl) .Marland KitchenMarland KitchenMarland KitchenMarland Kitchen 3 by mouth at bedtime  Orders: [facility]- Est  Level 4 (25427)   Complete Medication List: 1)  Hydrochlorothiazide 25 Mg Tabs (Hydrochlorothiazide) .... Take 1 tablet by mouth every morning 2)  Imipramine Hcl 25 Mg Tabs (Imipramine hcl) .... 3 by mouth at bedtime 3)  Omeprazole 20 Mg Cpdr (Omeprazole) .... Take 1 capsule by mouth twice a day 4)  Triamcinolone Acetonide 0.025 % Oint (Triamcinolone acetonide) .... Apply a small amount to affected area twice a day 5)  Oramorph Sr 15 Mg Tb12 (Morphine sulfate) .Marland Kitchen.. 1 by mouth two times a day fill after 12/7 6)  Enalapril Maleate 5 Mg Tabs (Enalapril maleate) .Marland Kitchen.. 1 by mouth once daily   Patient Instructions: 1)  Please schedule a follow-up appointment in 1 month. 2)  Try NuSalt - salt substitute with potassium 3)  Start enalapril 5 mg a day.   4)  You need a Pap Smear and a Mammogram 5)  Congratulations  on thw weight  loss!!!1    Prescriptions: ORAMORPH SR 15 MG  TB12 (MORPHINE SULFATE) 1 by mouth two times a day Fill after 12/7  #60 x 0   Entered and Authorized by:   Pearlean Brownie MD   Signed by:   Pearlean Brownie MD on 07/22/2007   Method used:   Print then Give to Patient   RxID:   [id number] ENALAPRIL MALEATE 5 MG TABS (ENALAPRIL MALEATE) 1 by mouth once daily  #30 x 3   Entered and Authorized by:   Pearlean Brownie MD   Signed by:   Pearlean Brownie MD on 07/22/2007   Method used:   Print then Give to Patient   RxID:   [id number]  ]

## 2010-09-17 NOTE — Progress Notes (Signed)
Summary: Medication  Phone Note Call from Patient Call back at Home Phone [phone]   Summary of Call: pt is wanting to speak with someone about the rx she received Friday for oxycotin Initial call taken by: Haydee Salter,  November 16, 2006 9:33 AM  Follow-up for Phone Call        Spoke with pt states RX for pain med written for 10 mg two times a day instead of 10 mg QID, states unable to deal with pain unless full dose is taken, rec'd 60 pills from pharmacy for one month.  Will consult with PCP Follow-up by: Tomasa Rand,  November 16, 2006 9:56 AM  Additional Follow-up for Phone Call Additional follow up Details #1::        [facility] O[facility]contin 10 mg needed will f/u with Dr. Deirdre Priest ( see copy of previous Rx in chart) Additional Follow-up by: Tomasa Rand,  November 16, 2006 10:52 AM   Additional Follow-up for Phone Call Additional follow up Details #2::    new Rx writen by Dr. Deirdre Priest, copy in chart. Original front desk for pt to pick up Pt aware. Follow-up by: Tomasa Rand,  November 16, 2006 11:02 AM

## 2010-09-17 NOTE — Assessment & Plan Note (Signed)
Summary: check weight & blood pressure/el   Vital Signs:  Patient Profile:   60 Years Old Female Height:     64 inches (162.56 cm) Weight:      282 pounds Temp:     97.5 degrees F Pulse rate:   70 / minute BP sitting:   145 / 89  Pt. in pain?   yes    Location:   back    Intensity:   6  Vitals Entered By: Jone Baseman CMA (February 18, 2007 9:23 AM)                Chief Complaint:  F/U wt and BP.  History of Present Illness: Osteoarthritis No change in her symptoms.   Continued pain in back and legs.  Is working on diet and trying to exercise.   No joint swelling or redness.  working on diet but lots of stress  Hypertension No cp  or shortness of breath. Does feel lightheaded when stands quickly.  Taking HCTZ without problems  Elevated LFTS  Had episode of right sided abd back chest pain yesterday associated with nausea.  Now back to normal.  No persistent discomfort or jaundice   Reviewed the patient's medications in Dr Tiajuana Amass.  They are updated and current.         Physical Exam  General:     Well-developed,well-nourished,in no acute distress; alert,appropriate and cooperative throughout examination Obese Abdomen:     Bowel sounds positive,abdomen soft and without masses, organomegaly or hernias noted. No CVAT Mild epigastric tenderness No gding or rbd  Msk:     Able to get up and down from exam table rapidly without problems    Impression & Recommendations:  Problem # 1:  ELEVATION, TRANSAMINASE/LDH LEVELS (ICD-790.4) Unsure if this recent pain was related.  check CMET today  Orders: [facility]- Est  Level 4 (16109)   Problem # 2:  OSTEOARTHRITIS OF SPINE, NOS (ICD-721.90) As per our agreement will decrease her oxycontin to three times a day.   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   Shadow other grnddtr in custody battle  Her dtr is pregnant at age 4 is not using drugs any longer will have a boy    Patient Instructions: 1)  Please schedule a follow-up appointment in 2 month. 2)  Need to continue to loose weight.   Will need to have lost 6 lbs by next visit in 2 months or will decrease oxycontine to two times a day  3)  Remember you need a colonscopy and a mammogram and a PAP smear.  You could have a cancer that we are missing  4)  Use ointment twice a day Triamcinolone 0.25% two times a day on arm 5)  I will call you if your lab is abnormal otherwise I will send you a letter within 2 weeks.

## 2010-09-17 NOTE — Assessment & Plan Note (Signed)
Summary: bp wp   Vital Signs:  Patient Profile:   60 Years Old Female Height:     64 inches (162.56 cm) Weight:      277 pounds Temp:     99.0 degrees F Pulse rate:   86 / minute BP sitting:   141 / 86  (left arm)  Pt. in pain?   no  Vitals Entered By: Jacki Cones RN (November 25, 2007 11:08 AM)                  Chief Complaint:  f/u weight and bp.  History of Present Illness: EAR PAIN Left ear aches and feels full for last 3 days.  No fever or allergy symptoms of sneezing or runny nose.  Her grand daughter had strep infection  OBESITY (ICD-278.00) continues to slowly loose weight.  Working with her dtr in a contest.  Upset that her home scales showed more wt loss than ours here.   Money is an issue with food choices.   Still problems with exercise due to pain in her legs.  No abdominal pain  HYPERTENSION Disease Monitoring   Blood pressure range: Has not been able to take has old injury to left arm and that is the one that all store machines check.  No money to buy her own    Chest pain: N     Dyspnea:N Medications   Compliance: taking only HCTZ.   Lightheadedness: N     Edema:N  ROS - as above PMH - Medications reviewed and updated in medication list.  Smoking Status noted in VS form       Current Allergies: AMOXICILLIN (AMOXICILLIN) ASPIRIN (ASPIRIN) MEPERIDINE HCL (MEPERIDINE HCL)      Physical Exam  Ears:     External ear exam shows no significant lesions or deformities.  Otoscopic examination reveals clear canals, tympanic membranes are intact bilaterally without discharge or redness mild retraction on Left TM . Hearing is grossly normal bilaterally. Lungs:     Normal respiratory effort, chest expands symmetrically. Lungs are clear to auscultation, no crackles or wheezes. Heart:     Normal rate and regular rhythm. S1 and S2 normal without gallop, murmur, click, rub or other extra sounds.    Impression & Recommendations:  Problem # 1:  EAR PAIN, LEFT  (ICD-388.70) Assessment: New eustachian tube dysfunciton.  Recommended afrin nasal spray for 3 days only Orders: [facility]- Est  Level 4 (60454)   Problem # 2:  OBESITY (ICD-278.00) Assessment: Unchanged Discussed how this is the main fixable problem to address her pain issues Orders: [facility]- Est  Level 4 (09811)   Problem # 3:  HYPERTENSION, BENIGN SYSTEMIC (ICD-401.1) Assessment: Improved Continue to hold enalapril as long as BP remains controlled Her updated medication list for this problem includes:    Hydrochlorothiazide 25 Mg Tabs (Hydrochlorothiazide) .Marland Kitchen... Take 1 tablet by mouth every morning    Enalapril Maleate 10 Mg Tabs (Enalapril maleate) ..... Not taking for now  Orders: [facility]- Est  Level 4 (91478)   Complete Medication List: 1)  Hydrochlorothiazide 25 Mg Tabs (Hydrochlorothiazide) .... Take 1 tablet by mouth every morning 2)  Imipramine Hcl 25 Mg Tabs (Imipramine hcl) .... 3 by mouth at bedtime 3)  Omeprazole 20 Mg Cpdr (Omeprazole) .... Take 1 capsule by mouth twice a day 4)  Triamcinolone Acetonide 0.025 % Oint (Triamcinolone acetonide) .... Apply a small amount to affected area twice a day 5)  Enalapril Maleate 10 Mg Tabs (Enalapril maleate) .Marland KitchenMarland KitchenMarland Kitchen  Not taking for now 6)  Oramorph Sr 15 Mg Tb12 (Morphine sulfate) .Marland Kitchen.. 1 by mouth two times a day.  should last until 12/28/07   Patient Instructions: 1)  Please schedule a follow-up appointment in 1 month. 2)  Work on weight loss 3)  Try to have blood pressure checked as often as can 4)  Can come by our office and weigh and check blood pressure 5)  Need a mammogram pap smear and colonscopy    Prescriptions: ORAMORPH SR 15 MG  TB12 (MORPHINE SULFATE) 1 by mouth two times a day.  Should last until 12/28/07  #60 x 0   Entered and Authorized by:   Pearlean Brownie MD   Signed by:   Pearlean Brownie MD on 11/25/2007   Method used:   Print then Give to Patient   RxID:   [id number]  ]

## 2010-09-17 NOTE — Progress Notes (Signed)
Summary: prior authorization  Phone Note Call from Patient Call back at Home Phone [phone]   Reason for Call: Talk to Nurse Summary of Call: pt is requesting to speak with rn, she sts her pharmacy needs a prior authorization for her rx for oxycotin, [facility] Initial call taken by: ERIN LEVAN,  April 29, 2007 10:56 AM  Follow-up for Phone Call        Pt informed that we will fill out the form and fax it today.  Pt agreeable.  Additional Follow-up for Phone Call Additional follow up Details #1::        medicaid PA form in md chart box Additional Follow-up by: Golden Circle RN,  April 29, 2007 11:18 AM         Appended Document: prior authorization Checking status of oxycontin - Pt thinks she may need to go to the ER. [phone]

## 2010-09-17 NOTE — Progress Notes (Signed)
Summary: Medication  Phone Note Call from Patient Call back at Home Phone [phone]   Reason for Call: Talk to Doctor Summary of Call: pt is requesting a refill on her oxycontin Initial call taken by: ERIN LEVAN,  November 11, 2006 1:52 PM   left message that rx would be available Fri am

## 2010-09-17 NOTE — Progress Notes (Signed)
Summary: Oxycodone  Medications Added HYDROCHLOROTHIAZIDE 25 MG TABS (HYDROCHLOROTHIAZIDE) Take 1 tablet by mouth every morning IMIPRAMINE PAMOATE 75 MG CAPS (IMIPRAMINE PAMOATE) Take 1 capsule by mouth every night OMEPRAZOLE 20 MG CPDR (OMEPRAZOLE) Take 1 capsule by mouth twice a day OXYCONTIN 10 MG TB12 (OXYCODONE HCL) Take 1 tablet by mouth four times a day TRIAMCINOLONE ACETONIDE 0.025 % OINT (TRIAMCINOLONE ACETONIDE) Apply a small amount to affected area twice a day      Allergies Added: AMOXICILLIN (AMOXICILLIN) ASPIRIN (ASPIRIN) MEPERIDINE HCL (MEPERIDINE HCL)  Phone Note From Pharmacy   Caller: [facility] Summary of Call: PT BROUGHT IN SCRIPT FOR OXYCODONE 10 MG THAT HAD BEEN URINATED ON- WRITTEN7/08 PHARMACY REQUESTING NEW RX BE WRITTEN AND PT WILL COME BY OFFICE AND PICKUP Initial call taken by: Dedra Skeens CMA,,  March 24, 2007 10:37 AM  Follow-up for Phone Call        pt checking status - needs medication today Follow-up by: Haydee Salter,  March 25, 2007 9:24 AM  Additional Follow-up for Phone Call Additional follow up Details #1::        [facility] they have a note to this effect but do not have the written RX.  I will need to see the written soilded Rx or have proof the pharmacy has it before can rewrite it.  The original was written to be filled after 03/25/07.   Pls notify patient  Thanks Additional Follow-up by: Pearlean Brownie MD,  March 25, 2007 10:24 AM   New Allergies: AMOXICILLIN (AMOXICILLIN) ASPIRIN (ASPIRIN) MEPERIDINE HCL (MEPERIDINE HCL) Additional Follow-up for Phone Call Additional follow up Details #2::    Pt sts that coffee was spilled on it.  Pt sts we told them to shred it.  Advised I would talk to Dr. Deirdre Priest. Follow-up by: Jone Baseman CMA,  March 25, 2007 10:31 AM  Additional Follow-up for Phone Call Additional follow up Details #3:: Details for Additional Follow-up Action Taken: Spoke with Dr. Deirdre Priest, according to  policy we can't refill.  Called to inform pt, she was very upset and promptly hung up the phone.  MD informed Additional Follow-up by: Jone Baseman CMA,  March 25, 2007 10:34 AM  New/Updated Medications: HYDROCHLOROTHIAZIDE 25 MG TABS (HYDROCHLOROTHIAZIDE) Take 1 tablet by mouth every morning IMIPRAMINE PAMOATE 75 MG CAPS (IMIPRAMINE PAMOATE) Take 1 capsule by mouth every night OMEPRAZOLE 20 MG CPDR (OMEPRAZOLE) Take 1 capsule by mouth twice a day OXYCONTIN 10 MG TB12 (OXYCODONE HCL) Take 1 tablet by mouth four times a day TRIAMCINOLONE ACETONIDE 0.025 % OINT (TRIAMCINOLONE ACETONIDE) Apply a small amount to affected area twice a day New Allergies: AMOXICILLIN (AMOXICILLIN) ASPIRIN (ASPIRIN) MEPERIDINE HCL (MEPERIDINE HCL)

## 2010-11-24 LAB — BASIC METABOLIC PANEL
BUN: 17 mg/dL (ref 6–23)
CO2: 27 mEq/L (ref 19–32)
Calcium: 9.6 mg/dL (ref 8.4–10.5)
Chloride: 102 mEq/L (ref 96–112)
Creatinine, Ser: 0.77 mg/dL (ref 0.4–1.2)
GFR calc Af Amer: >60 mL/min (ref >60)
GFR calc non Af Amer: >60 mL/min (ref >60)
Glucose, Bld: 174 mg/dL — ABNORMAL HIGH (ref 70–99)
Potassium: 4.7 mEq/L (ref 3.5–5.1)
Sodium: 139 mEq/L (ref 135–145)

## 2010-11-24 LAB — DIFFERENTIAL
Basophils Absolute: 0 10*3/uL (ref 0.0–0.1)
Basophils Relative: 0 % (ref 0–1)
Eosinophils Absolute: 0 10*3/uL (ref 0.0–0.7)
Eosinophils Relative: 0 % (ref 0–5)
Lymphocytes Relative: 20 % (ref 12–46)
Lymphs Abs: 1.4 10*3/uL (ref 0.7–4.0)
Monocytes Absolute: 0.3 10*3/uL (ref 0.1–1.0)
Monocytes Relative: 4 % (ref 3–12)
Neutro Abs: 5.4 10*3/uL (ref 1.7–7.7)
Neutrophils Relative %: 75 % (ref 43–77)

## 2010-11-24 LAB — POCT CARDIAC MARKERS
CKMB, poc: 1 ng/mL (ref 1.0–8.0)
CKMB, poc: 1.3 ng/mL (ref 1.0–8.0)
Myoglobin, poc: 136 ng/mL (ref 12–200)
Myoglobin, poc: 174 ng/mL (ref 12–200)
Troponin i, poc: <0.05 ng/mL (ref 0.00–0.09)
Troponin i, poc: <0.05 ng/mL (ref 0.00–0.09)

## 2010-11-24 LAB — CBC
HCT: 44.7 % (ref 36.0–46.0)
Hemoglobin: 15.4 g/dL — ABNORMAL HIGH (ref 12.0–15.0)
MCHC: 34.4 g/dL (ref 30.0–36.0)
MCV: 87.5 fL (ref 78.0–100.0)
Platelets: 282 10*3/uL (ref 150–400)
RBC: 5.1 MIL/uL (ref 3.87–5.11)
RDW: 14.2 % (ref 11.5–15.5)
WBC: 7.2 10*3/uL (ref 4.0–10.5)

## 2010-11-24 LAB — URINALYSIS, ROUTINE W REFLEX MICROSCOPIC
Bilirubin Urine: NEGATIVE
Glucose, UA: NEGATIVE mg/dL
Hgb urine dipstick: NEGATIVE
Ketones, ur: NEGATIVE mg/dL
Leukocytes, UA: NEGATIVE
Nitrite: NEGATIVE
Protein, ur: 30 mg/dL — AB
Specific Gravity, Urine: 1.027 (ref 1.005–1.030)
Urobilinogen, UA: 0.2 mg/dL (ref 0.0–1.0)
pH: 6 (ref 5.0–8.0)

## 2010-11-24 LAB — URINE MICROSCOPIC-ADD ON

## 2010-11-24 LAB — APTT: aPTT: 33 seconds (ref 24–37)

## 2010-11-24 LAB — GLUCOSE, CAPILLARY: Glucose-Capillary: 173 mg/dL — ABNORMAL HIGH (ref 70–99)

## 2010-11-24 LAB — PROTIME-INR
INR: 0.9 (ref 0.00–1.49)
Prothrombin Time: 12.6 seconds (ref 11.6–15.2)

## 2010-11-27 LAB — LIPID PANEL
Cholesterol: 133 mg/dL (ref 0–200)
HDL: 50 mg/dL (ref >39)
LDL Cholesterol: 58 mg/dL (ref 0–99)
Total CHOL/HDL Ratio: 2.7 RATIO
Triglycerides: 125 mg/dL (ref <150)
VLDL: 25 mg/dL (ref 0–40)

## 2010-11-27 LAB — DIFFERENTIAL
Basophils Absolute: 0 10*3/uL (ref 0.0–0.1)
Basophils Relative: 1 % (ref 0–1)
Eosinophils Absolute: 0 10*3/uL (ref 0.0–0.7)
Eosinophils Relative: 0 % (ref 0–5)
Lymphocytes Relative: 24 % (ref 12–46)
Lymphs Abs: 1.6 10*3/uL (ref 0.7–4.0)
Monocytes Absolute: 0.4 10*3/uL (ref 0.1–1.0)
Monocytes Relative: 6 % (ref 3–12)
Neutro Abs: 4.7 10*3/uL (ref 1.7–7.7)
Neutrophils Relative %: 70 % (ref 43–77)

## 2010-11-27 LAB — POCT I-STAT, CHEM 8
BUN: 22 mg/dL (ref 6–23)
Calcium, Ion: 1.07 mmol/L — ABNORMAL LOW (ref 1.12–1.32)
Chloride: 104 mEq/L (ref 96–112)
Creatinine, Ser: 0.8 mg/dL (ref 0.4–1.2)
Glucose, Bld: 141 mg/dL — ABNORMAL HIGH (ref 70–99)
HCT: 46 % (ref 36.0–46.0)
Hemoglobin: 15.6 g/dL — ABNORMAL HIGH (ref 12.0–15.0)
Potassium: 3.9 mEq/L (ref 3.5–5.1)
Sodium: 138 mEq/L (ref 135–145)
TCO2: 26 mmol/L (ref 0–100)

## 2010-11-27 LAB — CBC
HCT: 43.1 % (ref 36.0–46.0)
Hemoglobin: 14.9 g/dL (ref 12.0–15.0)
MCHC: 34.6 g/dL (ref 30.0–36.0)
MCV: 85.7 fL (ref 78.0–100.0)
Platelets: 257 10*3/uL (ref 150–400)
RBC: 5.03 MIL/uL (ref 3.87–5.11)
RDW: 14 % (ref 11.5–15.5)
WBC: 6.8 10*3/uL (ref 4.0–10.5)

## 2010-11-27 LAB — HEMOGLOBIN A1C
Hgb A1c MFr Bld: 6.3 % — ABNORMAL HIGH (ref 4.6–6.1)
Mean Plasma Glucose: 134 mg/dL

## 2010-11-27 LAB — CARDIAC PANEL(CRET KIN+CKTOT+MB+TROPI)
CK, MB: 2.3 ng/mL (ref 0.3–4.0)
Relative Index: 1.7 (ref 0.0–2.5)
Total CK: 136 U/L (ref 7–177)
Troponin I: <0.01 ng/mL (ref 0.00–0.06)

## 2010-11-27 LAB — POCT CARDIAC MARKERS
CKMB, poc: <1 ng/mL — ABNORMAL LOW (ref 1.0–8.0)
Myoglobin, poc: 129 ng/mL (ref 12–200)
Troponin i, poc: <0.05 ng/mL (ref 0.00–0.09)

## 2010-11-27 LAB — TROPONIN I: Troponin I: <0.01 ng/mL (ref 0.00–0.06)

## 2010-11-27 LAB — CK TOTAL AND CKMB (NOT AT ARMC)
CK, MB: 2.2 ng/mL (ref 0.3–4.0)
Relative Index: 1.9 (ref 0.0–2.5)
Total CK: 116 U/L (ref 7–177)

## 2010-12-31 NOTE — Consult Note (Signed)
NAMEWHITTLEY, CARANDANG                  ACCOUNT NO.:  [id number]   MEDICAL RECORD NO.:  [id number]          PATIENT TYPE:  EMS   LOCATION:  MAJO                         FACILITY:  [facility]   PHYSICIAN:  Luis Abed, MD, FACCDATE OF BIRTH:  July 08, 1951   DATE OF CONSULTATION:  03/05/2009  DATE OF DISCHARGE:  03/05/2009                                 CONSULTATION   PRIMARY CARDIOLOGIST:  Everardo Beals. Juanda Chance, MD, [facility]   PRIMARY CARE PHYSICIAN:  Pearlean Brownie, MD, however, has been  recently changed to Dr. Toni Arthurs.   REASON FOR CONSULTATION:  Chest pain.   HISTORY OF PRESENT ILLNESS:  This is a 60 year old morbidly obese  Caucasian female admitted with recurrent chest pain, dizziness, and  headache.  The patient is supposed to put a CPAP at night, but does not  have one and states that no one ever told her she needed one, however,  she awoke in the middle the night with sharp chest discomfort,  midsternal with near syncope and shortness of breath.  She also did have  some vomiting around 7 a.m., but has been awake since 3 a.m. with a  substernal chest pain, which has been constant.  She came to primary  care, Dr. Toni Arthurs, [facility] and was seen in her office.  The  patient's doctor said she was pale and continues to have chest pain.  Therefore, she decided she would send her to the emergency room for  further evaluation.  The patient has been given 1 sublingual  nitroglycerin and has been given some morphine for pain and has gotten  complete relief.   REVIEW OF SYSTEMS:  Positive for nausea, vomiting, chest pain, shortness  of breath, dyspnea on exertion, palpitations, and presyncope.  All other  systems have been reviewed and found to be negative other than those who  had been listed.   PAST MEDICAL HISTORY:  1. Morbid obesity.  2. Hypertension.  3. Bipolar disorder.  4. Anxiety.  5. Lumbar spine stenosis.  6. GERD.  7. Sleep apnea, but the patient does not use CPAP at home.  8. Borderline diabetes.   PAST CARDIAC WORKUP:  She had normal coronary arteries per  catheterization in 2006 with a followup in admission in April 2010 for  recurrent chest pain, revealed normal cardiac enzymes.  She was to  follow up with [place] to have the patient's stress Myoview, but she did not  show.   PAST SURGICAL HISTORY:  Bilateral tubal ligation and cholecystectomy.   SOCIAL HISTORY:  She lives a [place] with her son and daughter.  She does not smoke, does not drink, does not use drugs.   FAMILY HISTORY:  Mother deceased from cancer.  Father had an MI at age  52.  Sister with a pacemaker and CABG, one sister with diabetes and one  brother with a CABG.   CURRENT MEDICATIONS AT HOME:  1. Hydrochlorothiazide 25 mg daily.  2. Lisinopril 20 mg daily.  3. Prilosec 20 mg daily.  4. Toprol-XL 25 mg daily.   ALLERGIES:  PENICILLIN, ASPIRIN, and DEMEROL.  CURRENT LABORATORY DATA:  Sodium 139, potassium 4.7, chloride 102, CO2  27, BUN 17, creatinine 0.77, and glucose 174.  Hemoglobin 15.4,  hematocrit 44.7, white blood cells 7.2, and platelets 282.  Troponin  less than 0.05.  PTT 33, PT 12.6, and INR 0.9.  Chest x-ray revealing  mild cardiomegaly, no CHF or active disease.  EKG revealing sinus  rhythm, first-degree AV block with poor R-wave progression.   PHYSICAL EXAMINATION:  VITAL SIGNS:  Blood pressure 139/76, pulse 55,  respirations 19, temperature 97.6, and O2 sat 98% on room air.  GENERAL:  She is awake, alert, and oriented in no acute distress.  HEENT:  Head is normocephalic and atraumatic.  Eyes, PERRLA.  Mucous  membranes and mouth pink and moist.  Tongue is negative.  NECK:  Supple.  There is no JVD.  No carotid bruit.  Her neck is obese.  CARDIOVASCULAR:  Distant heart sounds.  Regular rhythm.  Pulses are 2+  and equal without bruits.  LUNGS:  Clear to auscultation.  Diminished bibasilar with poor  inspiratory effort.  ABDOMEN:  Obese, nontender, 2+ bowel  sounds.  EXTREMITIES:  Without clubbing, cyanosis, or edema.  NEUROLOGIC:  Cranial nerves II through XII are grossly intact.   IMPRESSION:  1. Atypical chest pain, sharp, midsternal, lasting hours.  2. Dyspnea, probable sleep apnea, but not on continuous positive      airway pressure, would recommend this.  3. Normal coronaries per cardiac catheterization in 2006 and has been      ruled out for myocardial infarction in April 2010.  4. Morbid obesity.   PLAN:  This is a 60 year old morbidly obese Caucasian female sent from  primary care office with a sharp chest pain, dyspnea, and presyncope.  The pain has been seen and examined by myself and Dr. Willa Rough.  The  pain is noncardiac, okay to go home with outpatient followup with Dr.  Juanda Chance and with Dr. Toni Arthurs, would recommend CPAP at night.  Pulmonary  consult could be done prior to this.  The patient will need to follow up  with Dr. Juanda Chance.  We will recommend highly that she remains compliant with her medications  and followup appointment.  Dr. Myrtis Ser has left a message with Dr. Toni Arthurs  on her answering machine at the clinic.  He called [phone] as there  was no answer.      Bettey Mare. Lyman Bishop, NP      Luis Abed, MD, [facility]  Electronically Signed    KML/MEDQ  D:  03/05/2009  T:  03/06/2009  Job:  [id number]   cc:   Dr. Toni Arthurs

## 2010-12-31 NOTE — H&P (Signed)
[name], [name]                  ACCOUNT NO.:  [id number]   MEDICAL RECORD NO.:  [id number]          PATIENT TYPE:  INP   LOCATION:  2005                         FACILITY:  [facility]   PHYSICIAN:  Maisie Fus C. Wall, MD, FACCDATE OF BIRTH:  07-08-1951   DATE OF ADMISSION:  11/24/2008  DATE OF DISCHARGE:                              HISTORY & PHYSICAL   PRIMARY CARDIOLOGIST:  Has been seen in the past by Bruce R. Juanda Chance, MD,  [facility] for cardiac catheterization only.   PRIMARY CARE PHYSICIAN:  Dr. Toni Arthurs in [place], but formerly seen  by Pearlean Brownie, MD at [facility].   HISTORY OF PRESENT ILLNESS:  This is a 60 year old morbidly obese  Caucasian female who presents to the emergency room with complaints of  chest pain.  The patient had a normal cath in 2007 with a history of  hypertension, osteoarthritis, and spinal stenosis.  The patient states  the pain started yesterday after having increased blood pressure.  The  patient was started on lisinopril by Dr. Toni Arthurs, 20 mg yesterday, but  she has not had it filled yet secondary to increased blood pressure over  the last couple of weeks.  The patient has normally taken  hydrochlorothiazide 25 mg daily and this was not controlling it.  The  patient had chest pain while sitting, watching television with  associated diaphoresis and nausea and flushing.  The pain lasted  approximately 6 hours, it was constant, described as heaviness, and she  played some relaxation tapes and eventually fell asleep.  The patient  this a.m. woke up feeling dizzy with near syncope.  The chest pain  returned, but not as severe or as bad.  The patient also had complaints  of nausea and vomiting.  The patient's only medications which she took  today were hydrochlorothiazide at 25 mg.  The patient was seen in [facility] Emergency Room, and given sublingual nitroglycerin and Zofran and  has had complete relief of discomfort.   REVIEW OF  SYSTEMS:  Positive for diaphoresis, chest pain, shortness of  breath, palpitations, nausea, and vomiting.   PAST MEDICAL HISTORY:  1. Bipolar disorder.  2. Hypertension.  3. Osteoarthritis of the spine.  4. Lumbar spinal stenosis.  5. Sleep apnea, but does not use CPAP.  6. Anxiety.  7. GERD.  8. Morbid obesity.  9. Borderline diabetes.  10.She is post menopausal.   PAST CARDIAC WORKUP:  The patient did have a cardiac catheterization  performed by Dr. Charlies Constable in June 2006 revealing normal coronary  angiography and left ventricular wall motion.  The patient was continued  on medications.   PAST SURGICAL HISTORY:  BTL and cholecystectomy.   SOCIAL HISTORY:  She lives in [place] with a former boyfriend.  She  is disabled.  They have 1 daughter together.  Does not smoke.  Does not  drink.  Does not use illicit drugs.   FAMILY HISTORY:  Mother deceased with breast cancer.  Father deceased  with an MI at age 57.  She has a brother who had an MI,  a sister with a  coronary artery bypass grafting and another sister with diabetes.   CURRENT MEDICATIONS:  1. Hydrochlorothiazide 25 mg daily.  2. OxyContin 20 mg b.i.d.  3. Valium 5 mg t.i.d.   ALLERGIES:  PENICILLIN, ASPIRIN, and DEMEROL.   Hemoglobin 14.9, hematocrit 43.1, white blood cells 6.8, and platelets  257.  Sodium 138, potassium 3.9, chloride 104, CO2 26, BUN 22,  creatinine 0.8, and glucose 141.  Troponin less than 0.05.  Chest x-ray  revealing no acute cardiopulmonary abnormality.  EKG revealing sinus  rhythm, ventricular rate of 65 beats per minute.   PHYSICAL EXAMINATION:  VITAL SIGNS:  Blood pressure 135/61, pulse 68,  respirations 18, temperature 97.7, O2 sat 98% on 2 L, and weight greater  than 250 pounds  HEENT:  Head is normocephalic and atraumatic.  Eyes:  PERRLA.  Mucous  membranes and mouth are pink and moist.  Tongue is midline.  NECK:  Supple.  No JVD.  No carotid bruits appreciated.   CARDIOVASCULAR:  Regular rate and rhythm without murmurs, rubs, or  gallops.  Pulses 2+ and equal without bruits.  LUNGS:  Clear to auscultation without wheezes, rales, or rhonchi.  ABDOMEN:  Soft, nontender, 2+ bowel sounds, obese.  EXTREMITIES:  Without clubbing, cyanosis, or edema.  NEUROLOGIC:  Cranial nerves II through XII are grossly intact.   IMPRESSION:  1. Chest pain with normal catheterization in 2007.  Negative cardiac      enzymes and negative EKG.  Appears musculoskeletal, but      nitroglycerin did relieve in the ER, question esophageal spasm.  2. History of hypertension.  Difficult to control at home with recent      medication adjustment.  3. Spinal stenosis and lumbar spinal stenosis.  4. Nausea and vomiting, rule out gastritis, hiatal hernia, and      esophageal spasms.  5. Morbid obesity.   PLAN:  This is a 60 year old morbidly obese Caucasian female who  presented to [facility] Emergency Room secondary to chest pain, which  lasted 6 hours yesterday and returned today though not as severe with  associated nausea, vomiting, and dizziness.  The patient had a normal  cath in 2007.  Negative EKG and negative cardiac enzymes.   The patient was seen and examined by Dr. Valera Castle in the emergency  room.  The patient will be admitted to rule out myocardial infarction.  She has cardiovascular risk factors despite the normal cath.  It is  probably GERD or esophageal spasms; however, we will keep overnight for  observation.  If cardiac enzymes are negative, return to primary care  physician for GI work.      Bettey Mare. Lyman Bishop, NP      Jesse Sans. Daleen Squibb, MD, [facility]  Electronically Signed    KML/MEDQ  D:  11/24/2008  T:  11/25/2008  Job:  [id number]   cc:   Dr. Toni Arthurs

## 2010-12-31 NOTE — Discharge Summary (Signed)
NAMEJENIA, KLEPPER                  ACCOUNT NO.:  [id number]   MEDICAL RECORD NO.:  [id number]          PATIENT TYPE:  INP   LOCATION:  2005                         FACILITY:  [facility]   PHYSICIAN:  Jonelle Sidle, MD DATE OF BIRTH:  06-05-1951   DATE OF ADMISSION:  11/24/2008  DATE OF DISCHARGE:  11/25/2008                               DISCHARGE SUMMARY   PRIMARY CARDIOLOGIST:  Everardo Beals. Juanda Chance, MD, [facility]   PRIMARY CARE [place]:  Pearlean Brownie, MD   DISCHARGE DIAGNOSIS:  Chest pain.   SECONDARY DIAGNOSES:  1. Morbid obesity.  2. Hypertension.  3. Bipolar disorder.  4. Anxiety.  5. Lumbar spinal stenosis.  6. Gastroesophageal reflux disease.  7. Sleep apnea with continuous positive airway pressure noncompliance.  8. Osteoarthritis of spine.  9. Postmenopausal.  10.Borderline diabetes.   ALLERGIES:  1. PENICILLIN.  2. ASPIRIN.  3. DEMEROL.   PROCEDURES:  None.   HISTORY OF PRESENT ILLNESS:  A 59 year old morbidly obese female with  above problem list.  She presented to the [facility] ED on November 24, 2008, following a 1-day history of chest discomfort.  She was able to  fall asleep the night prior to admission, but when she has on the  morning of admission, she felt dizzy with near syncope and recurrent  chest discomfort.  In the ED, ECG showed no acute changes and cardiac  markers were negative x1.  She was admitted for further evaluation.   HOSPITAL COURSE:  The patient ruled out for MI.  She has had no  recurrent discomfort.  We will plan to discharge her home today in good  condition.  We will arrange for her to have a 2-day Lexiscan Myoview in  our office with subsequent follow up with Dr. Juanda Chance.  The patient is  being discharged home today in good condition.   DISCHARGE LABORATORIES:  Hemoglobin 14.9, hematocrit 43.1, WBC 6.8, and  platelets 257.  Sodium 138, potassium 3.9, chloride 104, BUN 22,  creatinine 0.8, glucose 141, hemoglobin A1c 6.3, CK 136, MB  2.3,  troponin-I less than 0.01, total cholesterol 132, triglycerides 125, HDL  50, and LDL 58.   DISPOSITION:  The patient is being discharged home today in good  condition.   FOLLOWUP PLANS AND APPOINTMENTS:  We will arrange for outpatient  Lexiscan Myoview this coming week.  She is to follow up with Dr. Juanda Chance  approximately 2 weeks after that.  She also has to follow up Dr.  Deirdre Priest as previously scheduled.   DISCHARGE MEDICATIONS:  1. Hydrochlorothiazide 25 mg daily.  2. Lisinopril 20 mg daily.  3. Prilosec OTC 20 mg daily.   OUTSTANDING LABORATORY STUDIES:  None.   DURATION OF DISCHARGE ENCOUNTER:  Thirty-five minutes including  physician time.      Nicolasa Ducking, ANP      Jonelle Sidle, MD  Electronically Signed    CB/MEDQ  D:  11/25/2008  T:  11/26/2008  Job:  [id number]   cc:   Pearlean Brownie, M.D.

## 2011-01-03 NOTE — Op Note (Signed)
[place]  Patient:    [name], [name]                         MRN: [id number] Proc. Date: 03/23/01 Adm. Date:  [id number] Attending:  Henrene Dodge CC:         Dr. Jess Barters   Operative Report  PREOPERATIVE DIAGNOSIS:  Left breast mass, probably inflammatory.  POSTOPERATIVE DIAGNOSIS:  Left breast mass, inflammatory, with a fat necrosis versus fibrocystic disease.  PROCEDURE:  Left subareolar breast biopsy.  ANESTHESIA:  General anesthesia.  SURGEON:  Anselm Pancoast. Zachery Dakins, M.D.  HISTORY:  [name] is a 60 year old overweight Caucasian female, who I saw recently with a left breast mass.  She had had an evaluation at the breast center.  Dr. Doloris Hall was thinking that this area was probably inflammatory. The patient has a positive family history of breast cancer and wanted the area removed and on physical exam, there was an obviously kind of a marble-sized firm mass in the immediate subareolar area that was slightly tender.  There was some surrounding swelling of the breast tissue, but whether it was related to the mammogram or her menstrual cycle, as she was kind of premenstrual, I could not be sure.  I recommended that we excise it with general anesthesia, thinking that most likely this would be benign.  The patient was in agreement, and preoperatively she was noted to have a low hemoglobin of 7.2, but this was an iron deficiency that has been a chronic problem, and she is scheduled for gynecologic appointment later this week.  She wanted to proceed on the surgery and Dr. Abbey Chatters approved, since this is chronic, she has been as low as 6 in the past, that we could proceed.  DESCRIPTION OF PROCEDURE:  Preoperatively she given a dose of Cipro intravenously and induction with general anesthesia with endotracheal tube, since she has a history of reflux and she is overweight.  The left breast was prepped with Betadine  surgical scrub and solution and draped in a sterile manner.  With pulling down on the skin, you could show the areola, which is a fairly large areola, and I made the incision about halfway between the areolar edge and the nipple.  I made a little curved incision and then dissected the underlying breast tissue and the central portion of the duct system over in that quadrant from the surrounding breast tissue.  There was a firm, obviously different area and plus, there were some little microcysts in the breast tissue where we were dissecting.  A couple of areas required suturing with 4-0 Vicryl, and in the smaller vessels hemostasis was determined with cautery.  We then basically, the area completely excised, was sent for pathology to examine.  Dr. _____ reviewed it and feels that it is obviously inflammatory, whether it is fat necrosis or fibrocystic disease he is not sure, but he does not see any bacteria.  The defect in the deeper area was closed with a couple of sutures of 4-0 Vicryl, then a couple of 5-0 Vicryl subcuticular, and then a few simple 5-0 nylon sutures in the skin incision.  The patient tolerated the procedure nicely.  I had anesthetized the surrounding skin with Marcaine at the completion, and she will be released after a short stay in the recovery room.  I will see her back in the office in approximately a week for suture removal. DD:  03/23/01 TD:  03/23/01 Job: [id number]

## 2011-01-03 NOTE — H&P (Signed)
[name], [name]                  ACCOUNT NO.:  [id number]   MEDICAL RECORD NO.:  [id number]          PATIENT TYPE:  INP   LOCATION:  1830                         FACILITY:  [facility]   PHYSICIAN:  Wayne A. Sheffield Slider, M.D.    DATE OF BIRTH:  [birth year]   DATE OF ADMISSION:  12/02/2005  DATE OF DISCHARGE:                                HISTORY & PHYSICAL   PRIMARY CARE PHYSICIAN:  Dr. Pearlean Brownie at the [facility].   CHIEF COMPLAINT:  Chest pain, shortness of breath.   The patient is a 60 year old female with history of hypertension who  presents with a three to four-day history of sharp left-sided chest pain  brought on and worsened with exertion and relieved with rest.  Chest pain is  associated with shortness of breath, nausea and lightheadedness and will  occasionally radiate up to her neck.  Over the past day, the chest pain has  worsened.  She is currently unable to ambulate more than a few steps without  developing the chest pain or shortness of breath.  She also states she has  been under a lot of stress lately trying to lose weight as well as dealing  with many family issues.  She denies any recent illnesses.  She does note  occasional palpitations with her chest pain.   REVIEW OF SYSTEMS:  No fevers or chills, no cough, no diarrhea, no  constipation, no changes to her hair, no heartburn symptoms, no dysuria, no  rashes.   PAST MEDICAL HISTORY:  1.  Bipolar disorder.  2.  Hypertension.  3.  Osteoarthritis of the spine.  4.  Sleep apnea.  5.  Anxiety.  6.  Lumbar spinal stenosis.  7.  GERD.  8.  Liver failure with history of fatty liver on ultrasound.   PAST SURGICAL HISTORY:  1.  Tubal ligation.  2.  Cholecystectomy.   MEDICATIONS:  1.  Hydrochlorothiazide 25 mg p.o. daily.  2.  OxyContin 20 mg in the morning and before bed and 10 mg with dinner.  3.  Toprol XL 50 mg p.o. daily.  4.  Valium 5 mg p.o. t.i.d.   ALLERGIES:  1.   PENICILLIN causes angioedema.  2.  ASPIRIN causes severe nausea and vomiting.  3.  DEMEROL causes angioedema.   FAMILY HISTORY:  Mom died of breast cancer at age 49.  Father with MI at age  48.  He underwent multiple bypasses and eventually died at age 23 of heart  disease.  Brother currently age 63, had a MI at age 4.  She also has  another sister who underwent bypass in her 85s and another sister with  diabetes.   SOCIAL HISTORY:  Lives in her house with her daughter and adopted  granddaughter.  Currently, involved in a monogamous relationship with her  boyfriend.  Denies any alcohol, tobacco or other drug use.   PHYSICAL EXAMINATION:  VITAL SIGNS:  Temperature 98.1, pulse ranges 71 to 82  in the ER, blood pressure 144-156/74-83 in the ER.  Respiratory rate 18, 98%  on room air.  GENERAL:  Well-appearing, in no acute distress, alert and oriented x3.  HEENT:  Moist mucus membranes.  Nostrils are mildly boggy.  Throat is  without erythema or exudate.  Pupils are equal, round, and reactive to light  and accommodation.  Tympanic membranes are clear bilaterally.  NECK:  No carotid bruits.  No thyromegaly or thyroid nodules.  CHEST:  Chest pain nonreproducible.  LUNGS:  Clear to auscultation bilaterally.  There are no wheezes, rales or  rhonchi.  HEART:  Regular rate and rhythm.  No murmurs, gallops, or rubs.  ABDOMEN:  Obese, positive bowel sounds.  Soft, nontender, and nondistended.  No hepatosplenomegaly.  EXTREMITIES:  No clubbing, cyanosis, or edema.  PULSES:  2+ dorsalis pedis and radial pulses bilaterally.  NEUROLOGICAL:  Cranial nerves II through XII are grossly intact.  The  patient with 5/5 upper and lower extremity strength.   LABORATORY DATA:  Chest x-ray showed borderline heart size but no acute  cardiopulmonary disease.  EKG was normal sinus rhythm at 65 beats per minute  with questionable first degree AV block.  Point of care enzymes were  negative x1.  Sodium 140,  potassium 3.9, chloride 108, bicarb 26.3, BUN 14,  creatinine 0.8, glucose 83.  White blood cell count 9.0, hemoglobin 14.6,  hematocrit 42.8, platelet count 266,000.   ASSESSMENT:  A 60 year old female with typical anginal chest pain.   PLAN:  1.  Chest pain:  The patient with symptoms consistent with unstable angina.      Other possibilities in the differential diagnoses include anxiety, GERD,      costochondritis, and PE.  Given the patient's strong family history of      heart disease and concern for coronary artery disease, we will admit the      patient to telemetry.  We will cycle cardiac enzymes.  We will repeat an      EKG in the morning.  We will also risk stratify the patient by checking      fasting lipid panels as well as a fasting CBG.  We will also check a      TSH.  We will consult cardiology in the morning for possible inpatient      ischemic workup.  We will continue the patient on Toprol XL.  If enzymes      increase, we will start heparin and Plavix as the patient has a history      of allergy to aspirin.  We will use nitroglycerin as needed for pain.      The patient is currently painfree and does not require nitroglycerin      drip.  Pulmonary embolus is in her differential, although at this point      thought to be less likely as the patient is with normal oxygen      saturation, not tachypneic or tachycardic and doubt risk factors.  2.  GI:  GERD is possible etiology and we will start Protonix.  3.  Hypertension:  We will continue HCTZ and Toprol XL and adjust doses as      necessary with blood pressure.  4.  History of anxiety disorder/bipolar:  The patient is currently stable on      Valium 5 mg p.o. t.i.d. and we will      continue this medication.  5.  Chronic back pain:  We will continue OxyContin.  6.  DVT prophylaxis:  We will start Lovenox.      Benn Moulder,  M.D.    ______________________________ Arnette Norris. Sheffield Slider, M.D.    MR/MEDQ  D:  12/03/2005   T:  12/03/2005  Job:  [id number]

## 2011-01-03 NOTE — Group Therapy Note (Signed)
[name], [name]                  ACCOUNT NO.:  [id number]   MEDICAL RECORD NO.:  [id number]          PATIENT TYPE:  WOC   LOCATION:  [facility]                   FACILITY:  [facility]   PHYSICIAN:  Tinnie Gens, MD        DATE OF BIRTH:  [birth year]   DATE OF SERVICE:  11/15/2004                                    CLINIC NOTE   CHIEF COMPLAINT:  Yearly exam.   HISTORY OF PRESENT ILLNESS:  The patient is a 60 year old gravida 4 para 3  who has previously been seen here for abnormal vaginal bleeding. She had  undergone Pap smear and endometrial biopsy in August 2004. Her biopsy was  normal. Her Pap smear was ASCUS with high-risk HPV nondetected at that time.  Since that time she has not had a period and has undergone what she believes  is menopause secondary to the amount of hot flashes she had.   The patient had also complained today of loss of urine with urge, small  voids, and not being capable of emptying her bladder well, as well as loss  of urine with coughing or sneezing or general Valsalva.   PAST MEDICAL HISTORY:  Significant for arthritis, peptic ulcer disease,  hypertension, pneumonia.   PAST SURGICAL HISTORY:  Cholecystectomy, breast surgery, tubal ligation.   MEDICATIONS:  Neurontin, diazepam, OxyContin, and hydrochlorothiazide.   ALLERGIES:  DEMEROL, PENICILLIN, ASPIRIN.   OBSTETRICAL HISTORY:  G4 P3. Three vaginal deliveries.   GYNECOLOGICAL HISTORY:  Menarche at age 52. Menopause at age 66. History of  multiple abnormal Paps. The patient's last mammogram was 1 month ago.   FAMILY HISTORY:  Diabetes, coronary artery disease, hypertension, breast and  uterine cancer.   SOCIAL HISTORY:  No tobacco, alcohol, or drug use.   REVIEW OF SYMPTOMS:  A 14-point review of systems was reviewed and is  negative except as in the HPI.   PHYSICAL EXAMINATION TODAY:  VITAL SIGNS:  The patient is morbidly obese at  292 pounds. Blood pressure is 148/84, pulse is 79.  ABDOMEN:   Soft, nontender, nondistended.  GENITOURINARY:  She has normal external female genitalia. The vagina is  pale. There appears to be good support. The uterus is markedly retroverted.  The cervix is very anterior and above the pubic symphysis. The rest of the  bimanual exam was significantly limited by body habitus.   IMPRESSION:  1.  Yearly examination with Pap smear.  2.  Urinary incontinence, probable mixed picture.   PLAN:  1.  Pap smear today.  2.  Urology referral for possible urodynamic testing and classification. The      patient may benefit from Ditropan. Pelvic organs feel like they have      good support and I do not feel a significant cystocele or rectocele even      with Valsalva.      TP/MEDQ  D:  11/15/2004  T:  11/15/2004  Job:  [id number]

## 2011-01-03 NOTE — Consult Note (Signed)
[name], [name]                            ACCOUNT NO.:  [id number]   MEDICAL RECORD NO.:  [id number]                   PATIENT TYPE:  REC   LOCATION:  [facility]                                  FACILITY:  [facility]   PHYSICIAN:  Zachary George, DO                      DATE OF BIRTH:  11-Sep-1950   DATE OF CONSULTATION:  07/29/2002  DATE OF DISCHARGE:                                   CONSULTATION   REASON FOR CONSULTATION:  The patient returns to clinic today for a trial of  lumbar epidural steroid injections for degenerative joint disease of the  lumbar spine at L5-S1 with chronic low back pain and right lower extremity  radicular symptoms in a L5-S1 distribution.  The patient was initially seen  on 07/27/02.  She does not complain of any new neurologic symptoms.  I  reviewed health and history form and 14 point review of systems.  The  patient's pain is a 9/10 on a subjective scale today.  Function and quality  of life indices remain declined.  She continues on OxyContin 20 mg b.i.d.,  and again expresses her desire to ultimately come off of the OxyContin.  She  also continues on Neurontin 3600 mg q.d.  I reviewed health and history form  and 14 point review of systems.   PHYSICAL EXAMINATION:  VITAL SIGNS:  Blood pressure 142/72, pulse 63,  respirations 14 and regular, O2 saturation is 985 on room air.   IMPRESSION:  Degenerative disk disease of the lumbar spine with right lower  extremity radicular symptoms in a L5-S1 distribution.   PLAN:  1. Lumbar epidural steroid injection.  2. Continue current medications for now.  Consider eventually weaning from     OxyContin which can be done by her primary care physician, and I could     assist with guidance in this regard, otherwise, would consider opiate     detoxification at [facility].  3. The patient is to return to clinic in two to three weeks for re-     evaluation and possible repeat lumbar epidural steroid injection as  predicated upon the patient's response and symptoms.   PROCEDURE:  Lumbar epidural steroid injection.   DESCRIPTION OF PROCEDURE:  The procedure was described to the patient in  detail, including risks, benefits, limitations, and alternatives.  Risks  include, but are not limited to bleeding, infection, allergic reaction to  medication, spinal headaches, nerve injury.  The patient wishes to proceed.  Informed consent was obtained.  The patient was brought back to the  fluoroscopy suite and placed on the table in the prone position.  Skin was  prepped and draped in the usual sterile fashion.  Skin and subcutaneous  tissues were anesthetized with 3 cc of preservative-free 1% lidocaine.  Under direct fluoroscopic guidance, an 18 gauge 3.5 inch Hustead needle was  advanced into the right paramedian L5-S1 epidural space with loss of  resistance technique.  No CSF, heme, or paresthesias were noted.  This was  then followed by the injection of 1 cc of Kenalog, 40 mg/cc, plus 3 cc of  normal saline with needle flush.  No complications.  The patient tolerated  the procedure well.  Discharge instructions given.  The patient is released  in stable condition.   The patient was educated on the above findings and recommendations and  understands.  There were no barriers to communication.                                               Zachary George, DO    JW/MEDQ  D:  07/29/2002  T:  07/30/2002  Job:  [id number]
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
## 2011-01-03 NOTE — Consult Note (Signed)
[name], [name]                  ACCOUNT NO.:  [id number]   MEDICAL RECORD NO.:  [id number]          PATIENT TYPE:  INP   LOCATION:  2017                         FACILITY:  [facility]   PHYSICIAN:  Jonelle Sidle, M.D. LHCDATE OF BIRTH:  1951/01/16   DATE OF CONSULTATION:  12/03/2005  DATE OF DISCHARGE:                                   CONSULTATION   REFERRING PHYSICIAN:  Dr. Raford Pitcher.   PRIMARY CARDIOLOGIST:  Jesse Sans. Wall, M.D.   PRIMARY CARE PHYSICIAN:  Pearlean Brownie, M.D.   REASON FOR CONSULTATION:  Recent onset of shortness of breath and chest  pain.   HISTORY OF PRESENT ILLNESS:  [name] is a morbidly obese 60 year old woman  with a history of reportedly mild obstructive sleep apnea, hypertension,  gastroesophageal reflux disease, and no clearly documented history of  coronary artery disease based on available information.  She has been  evaluated in the past with noninvasive stress testing and was seen by Dr.  Daleen Squibb in August 2005.  At that time, she had evidence of soft tissue  attenuation but no ischemia by Myoview study, and an ejection fraction of  75%.  She is now admitted to the hospital describing a 2-week history of  progressive dyspnea on exertion with mild to moderate levels of activity  which is apparently new.  She has experienced chest discomfort over the last  3 to 4 days that is typically mild but intermittent and progressively more  intense with exertion.  She describes it as fairly sharp in the left side of  her chest with some radiation into the neck and, at least on one occasion,  was associated with an episode of nausea and emesis.  On interview now, she  is denying any active chest pain.  At this point, she has ruled out for  myocardial infarction with serial cardiac markers showing a peak troponin I  level of 0.02 and a BNP level of 81.  Her chest x-ray reports borderline  heart size with no acute cardiopulmonary disease pattern, and her  electrocardiogram shows normal sinus rhythm with a borderline Q wave in lead  III and nonspecific ST-T wave changes. We have been asked to evaluate her  further.   ALLERGIES:  1.  PENICILLIN.  2.  ASPIRIN intolerance.  3.  DEMEROL.  4.  ANTI-INFLAMMATORY MEDICATIONS.   CURRENT MEDICATIONS:  1.  Hydrochlorothiazide 25 mg p.o. daily.  2.  Toprol XL 50 mg p.o. daily.  3.  Oxycodone as written.  4.  Valium 5 mg p.o. 3 times a day.  5.  Protonix 40 mg p.o. daily.  6.  Heparin infusion.   PAST MEDICAL HISTORY:  1.  Hypertension.  2.  Mild obstructive sleep apnea, presently not on CPAP therapy.  3.  Gastroesophageal reflux disease.  4.  History of migraine headaches.  5.  Degenerative joint disease.  6.  Reported borderline personality disorder with previous suicide attempt      in 1996.  7.  Status post cholecystectomy and tubal ligation.   SOCIAL HISTORY:  The patient lives in  [place].  She is divorced and  presently unemployed.  She has a prior history of tobacco use but not at  present and no significant alcohol use.   FAMILY HISTORY:  Significant for premature cardiovascular disease.  The  patient states that her father died at age 55 with myocardial infarction,  and she has recently had a sister undergo bypass surgery at age 56.   REVIEW OF SYSTEMS:  As in History of Present Illness.  She has occasional  feeling of dizziness but no palpitations or syncope.  She has had increased  stress.  She cares for a 37-year-old that she adopted and does all of her  activities of daily living.  She experiences anxiety and some postmenopausal  symptoms.   PHYSICAL EXAMINATION:  VITAL SIGNS: Temperature 97.7 degrees, heart rate 66,  respiratory rate 18, blood pressure 156/86, O2 saturation 96% on room air.  Weight is 288 pounds.  GENERAL:  This is a morbidly obese woman in no acute distress, denying any  active chest pain.  NECK: No elevated jugular venous pressure, without  carotid bruits.  No  thyromegaly is noted.  LUNGS:  Clear without labored breathing at rest.  CARDIAC: Regular rate and rhythm without loud murmur, pericardial rub, or S3  gallop.  ABDOMEN:  Obese.  There is no obvious hepatomegaly, although this was  difficult to appreciate.  No bruits noted.  Bowel sounds are present.  EXTREMITIES:  Venous stasis changes, pitting edema.   LABORATORY DATA:  Sodium 139, potassium 3.4, chloride 105, bicarb 27,  glucose 165, BUN 15, creatinine 1.  Liver function tests slightly abnormal  with AST of 48 but normal ALT of 35, total bilirubin 0.5, alkaline  phosphatase 67, albumin 3.5.  Peak troponin I level at this point is 0.02.  Peak CK 112, peak CK-MB 1.  TSH 4.4.  BNP less than 30.   IMPRESSION:  1.  Two-week history of progressive dyspnea on exertion with decreasing      levels of activity as well as a 3 to 4-day history of sharp exertional      chest pain.  Electrocardiogram at this point is nonspecific, and cardiac      markers are reassuring, although this is noted in the setting of a      morbidly obese 60 year old woman with hypertension and family history of      premature cardiovascular disease.  She has undergone previous      noninvasive testing as recently as August 2005 without evidence of      ischemic heart disease and normal ejection fraction.  Followup      echocardiography done this hospital stay reveals continued normal left      ventricular function with no focal wall motion abnormalities.  2.  Uncertain lipid status.  3.  History of psychiatric illness as noted above, apparently stable as of      late.  4.  Reportedly mild obstructive sleep apnea, presently not on CPAP.  5.  History of gastroesophageal reflux disease.   RECOMMENDATIONS:  1.  I discussed this situation in detail wit the patient.  We reviewed the     indications, risks, and benefits of noninvasive versus invasive testing      to evaluate for ischemic heart disease.   After further discussion, our      plan is to proceed with diagnostic coronary angiography tomorrow to      clearly outline the coronary anatomy given the  patient's recent onset of symptoms with progressive severity.  She is in      agreement to proceed.  2.  Will discuss with her the reported ASPIRIN allergy/intolerance in more      detail to better flesh this out.  3.  Further plans to follow.           ______________________________  Jonelle Sidle, M.D. [facility]     SGM/MEDQ  D:  12/03/2005  T:  12/03/2005  Job:  [id number]   cc:   Dr. Christena Deem C. Wall, M.D.  [address]  [address]  [place]  [place] [address]   Pearlean Brownie, M.D.  Fax: [phone]

## 2011-01-03 NOTE — Discharge Summary (Signed)
[name], [name]                  ACCOUNT NO.:  [id number]   MEDICAL RECORD NO.:  [id number]          PATIENT TYPE:  INP   LOCATION:  2017                         FACILITY:  [facility]   PHYSICIAN:  Wayne A. Sheffield Slider, M.D.    DATE OF BIRTH:  27-Jan-1951   DATE OF ADMISSION:  12/02/2005  DATE OF DISCHARGE:  12/04/2005                                 DISCHARGE SUMMARY   PRIMARY CARE PHYSICIAN:  Dr. Deirdre Priest   CONSULTS:  Cardiology, Dr. Diona Browner   DISCHARGE DIAGNOSES:  1.  Chest pain.  2.  Hypertension.  3.  Bipolar disorder.  4.  Chronic pain.  5.  Obesity.   PROCEDURES:  1.  2-D echocardiogram December 03, 2005 showed normal left ventricular      systolic function, no ventricular regional wall abnormalities with      aortic valve mildly calcified.  2.  Chest x-ray on December 03, 2005 showed borderline cardiomegaly, but no      acute cardiopulmonary disease.  3.  EKG was normal sinus rhythm at 63 beats per minute.  4.  Cardiac catheterization on December 04, 2005 was normal with no signs of      coronary artery disease with an ejection fraction of 60%.   LABORATORY DATA:  On admission white blood cell count 9, hemoglobin 14.6,  hematocrit 42.8, platelet count 266.  Sodium 140, potassium 3.9, chloride  108, bicarbonate 26.3, BUN 14, creatinine 0.8, glucose 83.  BNP less than  30.  Thyroid stimulating hormone was within normal limits.  Total  cholesterol 167, triglycerides 91, HDL 60, LDL 89.  Hemoglobin A1c 5.2.  Total bilirubin 0.5, AST 35, ALT 48, alkaline phosphatase 47.  Cardiac  enzymes were negative x3.   DISCHARGE MEDICATIONS:  1.  Hydrochlorothiazide 25 mg p.o. daily.  2.  Toprol XL 50 mg p.o. daily.  3.  Valium 5 mg p.o. t.i.d.  4.  Protonix 40 mg p.o. daily.  5.  OxyContin 20 mg by mouth b.i.d. with 10 mg by mouth with dinner.   BRIEF HISTORY OF PRESENT ILLNESS:  Patient is a 60 year old female with past  medical history of hypertension as well as significant family history  for  coronary artery disease who presented with a one-week history of increasing  shortness of breath with exertion as well as left-sided chest pain with  exertion that is relieved with rest.  Please see admission history and  physical for full details of the admission.  Patient was admitted to the  family practice teaching service for the diagnosis of angina and to rule out  MI.   #1 - CHEST PAIN:  Patient was admitted to telemetry.  There were no acute  events or arrhythmias on telemetry.  Repeat EKG was within normal limits.  Cardiac enzymes were negative x3.  Patient was started on heparin drip.  She  underwent a 2-D echocardiogram to evaluate heart function and patient had a  normal ejection fraction with no LV wall abnormalities.  Patient underwent  cardiac catheterization on December 04, 2005 which showed normal coronary  artery anatomy with  an ejection fraction of 60%.  Patient's chest pain and  dyspnea improved during her hospital course.  Patient's vital signs were  within normal limits throughout her hospital course.  She was not  tachycardic and was with normal oxygen saturations.  Patient tolerated  cardiac catheterization well and was stable for discharge after the  procedure.  Patient's risk factors were stratified with her fasting blood  sugars normal as well as with cholesterol panel as above.  Patient also has  a hemoglobin A1c of 5.2.   #2 - HYPERTENSION:  Patient was continued on hydrochlorothiazide 25 mg by  mouth once daily and Protonix 40 mg by mouth once daily.  Patient's blood  pressure on the day of discharge was 123/69.  I would continue these  medications as an outpatient.   #3 - CHRONIC PAIN:  Patient's pain was well controlled on her home regimen  of OxyContin.   #4 - OBESITY:  Discussed at length both on admission and at discharge the  importance of weight loss for the patient's overall health.  Discussed  strategies such as eating small, frequent meals.   I would recommend  continued follow-up with __________ at the family practice center to help  with weight loss.   #5 - BIPOLAR DISORDER:  Patient stable on Valium 5 mg by mouth three times  daily.  Anxiety certainly is a strong possibility for etiology of the chest  pain.   #6 - GI:  Patient started on Protonix while in the hospital as GERD is  possibility for patient's chest pain.  Patient will continue Protonix as an  outpatient.   FOLLOW-UP APPOINTMENTS:  Dr. Deirdre Priest, phone number [phone] on December 09, 2005 as previously scheduled.      Benn Moulder, M.D.    ______________________________  Arnette Norris. Sheffield Slider, M.D.    MR/MEDQ  D:  12/04/2005  T:  12/05/2005  Job:  [id number]   cc:   Jonelle Sidle, M.D. [facility]  [address]  [place]  [place] [address]   Pearlean Brownie, M.D.  Fax: [phone]

## 2011-01-03 NOTE — Op Note (Signed)
NAME:  [name], [name]                            ACCOUNT NO.:  [id number]   MEDICAL RECORD NO.:  [id number]                   PATIENT TYPE:  AMB   LOCATION:  SDC                                  [facility]   PHYSICIAN:  Phil D. Okey Dupre, M.D.                  DATE OF BIRTH:  May 26, 1951   DATE OF PROCEDURE:  05/09/2002  DATE OF DISCHARGE:                                 OPERATIVE REPORT   PROCEDURE:  Dilatation, curettage, and hysteroscopic examination.   PREOPERATIVE DIAGNOSES:  1. Thickened endometrium by ultrasound.  2. Menometrorrhagia causing secondary anemia.  3. Strong family history of endometrial cancer.   POSTOPERATIVE DIAGNOSES:  1. Thickened endometrium by ultrasound.  2. Menometrorrhagia causing secondary anemia.  3. Strong family history of endometrial cancer.  4. Uterine fibroids.  5. Pending pathology report.   PROCEDURE AS FOLLOWS:  Under satisfactory general anesthesia with the  patient in a dorsal lithotomy position, the perineum and vagina are prepped  and draped in the usual sterile manner.  Bimanual pelvic examination under  anesthesia revealed a uterus in second degree retroversion, smooth in  configuration, but could not be well outlined because of the habitus in the  patient.  Weighted speculum was placed in the posterior fourchette of the  vagina through a marital introitus.  BUS was within normal limits.  The  vagina had collapsing walls because of the obesity of the patient but it was  clean and well rugated.  The anterior lip of a scarred up hypertrophied  cervix was grasped with a single tooth tenaculum.  The cervix came down very  poorly just above mid vagina.  Could not be put down any further and would  make a vaginal hysterectomy very difficult in this patient.  Uterine cavity  was sounded to 11 cm and the cervical os dilated to a number 6 Hegar  dilator.  A hysteroscope using saline as dilating medium was inserted into  the uterine cavity and  there were multiple submucous leiomyomata noted in  the walls of the uterus, especially on the lower right side.  The uterine  cavity was then curetted with a small serrated curette followed by curettage  of the larger sharp curette and tissue sent for pathological diagnosis.  There was a minimal blood loss during the procedure.  Tolerated well.  Tenaculum and speculum removed from the vagina.  The patient transferred to  recovery room in satisfactory condition.  Once again, I reiterate that I  think vaginal hysterectomy, although could be accomplished, will be  extremely difficult in this patient and probably will have to undergo  abdominal hysterectomy.  Phil D. Okey Dupre, M.D.    PDR/MEDQ  D:  05/09/2002  T:  05/09/2002  Job:  [id number]

## 2011-01-03 NOTE — Cardiovascular Report (Signed)
[name], [name]                  ACCOUNT NO.:  [id number]   MEDICAL RECORD NO.:  [id number]          PATIENT TYPE:  INP   LOCATION:  2017                         FACILITY:  [facility]   PHYSICIAN:  Charlies Constable, M.D. [facility] DATE OF BIRTH:  13-Jun-1951   DATE OF PROCEDURE:  12/04/2005  DATE OF DISCHARGE:  12/04/2005                              CARDIAC CATHETERIZATION   CLINICAL HISTORY:  [name] is 60 years old and sees Dr. Pearlean Brownie with [facility]. She has a history of  hypertension, obstructive sleep apnea, and gastroesophageal reflux disease.  She was recently admitted to the hospital with shortness of breath and chest  discomfort. She was seen in consultation by Dr. Diona Browner and he recommended  evaluation with catheterization.   PROCEDURES:  The procedure was performed via the right femoral artery using  an arterial sheath and 6 French pre-form coronary catheters. A front wall  anterior puncture was performed and Omnipaque contrast was used. After  distal aortogram was performed to rule out  renovascular causes for  hypertension. The patient tolerated the procedure well and left the  laboratory in satisfactory condition.   RESULTS:  LEFT MAIN CORONARY ARTERY:  The left main coronary artery was free  of significant disease.   LEFT ANTERIOR DESCENDING ARTERY:  The left anterior descending artery gave  rise to 2 diagonal branches and 4 septal perforators. These and the LA  popliteal were free of significant disease.   CIRCUMFLEX ARTERY:  The circumflex artery gave rise to an atrial branch, a  marginal branch, second marginal branch, and a posterolateral branch. These  vessels were free of significant disease.   RIGHT CORONARY ARTERY:  The right coronary artery was a moderate size vessel  that gave rise to a conus branch, 2 right ventricular branches, posterior  descending branch, and 2 posterolateral branches. These vessels were free of  significant  disease.   LEFT VENTRICULOGRAM:  The left ventriculogram was performed in the RAO  projection and showed good wall motion with no evidence of hypokinesis. The  estimated ejection fraction was 60%.   A distal aortogramwas performed, which showed patent renal arteries and no  significant aortic obstruction.   CONCLUSION:  The aortic pressure was 130/74 with a mean of 97.  Left ventricular pressure was 130/27.   IMPRESSION:  Normal coronary angiography and left ventricular wall motion.   RECOMMENDATIONS:  Reassurance.           ______________________________  Charlies Constable, M.D. [facility]     BB/MEDQ  D:  12/04/2005  T:  12/05/2005  Job:  [id number]   cc:   Thomas C. Wall, M.D.  [address]  [address]  [place]  [place] [address]   Pearlean Brownie, M.D.  Fax: [phone]   Cardiopulmonary Laboratory

## 2011-01-03 NOTE — Group Therapy Note (Signed)
[name], [name]                            ACCOUNT NO.:  [id number]   MEDICAL RECORD NO.:  [id number]                   PATIENT TYPE:  OUT   LOCATION:  [facility]                           FACILITY:  [facility]   PHYSICIAN:  Tinnie Gens, MD                     DATE OF BIRTH:  05-25-1951   DATE OF SERVICE:  03/31/2003                                    CLINIC NOTE   CHIEF COMPLAINT:  Postmenopausal bleeding.   HISTORY OF PRESENT ILLNESS:  The patient is a 60 year old white female who  apparently has had bleeding since she started going through the change.  She  notes she has been going through the change secondary to hot flashes and  night sweats.  She reports this has been going on for several years.  She  has never been a full year without a period.  She previously had a D&C,  hysteroscopy which was noted to have endometrial polyps as well as  submucosal fibroids in September of last year.  She does have a very strong  family history of uterine cancer in her grandmother and sister.  Her mother  also died of breast cancer.  She is having increased bleeding with pain and  bleeding monthly with blood clots.  She has had secondary anemia secondary  to her heavy vaginal bleeding.  She is here today for question of  hysterectomy.   PHYSICAL EXAMINATION:  VITAL SIGNS:  Blood pressure 134/89, weight 267,  pulse 67.  GENERAL:  She is an obese white female in no acute distress.  PELVIC:  She has normal external female genitalia.  The cervix is rugated  with clear discharge.  Cervix is difficult to visualize, but is very parous  and mildly large.  A Pap smear is obtained.  The cervix is then cleaned with  Betadine and a Pipelle was used to sound the uterus to 10 cm and endometrial  biopsy is obtained.  On bimanual examination the uterus feels somewhat  enlarged, but not terribly.  The adnexa are benign.   IMPRESSION:  1. Dysfunctional uterine bleeding, question related to menopause.  2.  Strong family history of uterine cancer.  3. Obesity.  4. Chronic pain syndrome.   PLAN:  Pap smear, endometrial biopsy today.  She will return in two weeks to  discuss further management and plans to include hysterectomy.  This patient  probably would be a good candidate for vaginal hysterectomy in that she has  had four previous vaginal deliveries, although the uterus did not feel very  mobile on examination.  That can be assessed better at time of procedure.  Tinnie Gens, MD    TP/MEDQ  D:  03/31/2003  T:  03/31/2003  Job:  [id number]

## 2011-05-09 LAB — URINALYSIS, ROUTINE W REFLEX MICROSCOPIC
Bilirubin Urine: NEGATIVE
Glucose, UA: NEGATIVE
Hgb urine dipstick: NEGATIVE
Ketones, ur: NEGATIVE
Nitrite: NEGATIVE
Protein, ur: NEGATIVE
Specific Gravity, Urine: >1.046 — ABNORMAL HIGH
Urobilinogen, UA: 1
pH: 6

## 2011-12-10 ENCOUNTER — Ambulatory Visit (HOSPITAL_COMMUNITY)
Admission: RE | Admit: 2011-12-10 | Discharge: 2011-12-10 | Disposition: A | Discharge status: Home / Self Care | Payer: Medicare Other | Source: Ambulatory Visit | Attending: Family Medicine | Admitting: Family Medicine

## 2011-12-10 ENCOUNTER — Encounter: Payer: Self-pay | Admitting: Family Medicine

## 2011-12-10 ENCOUNTER — Ambulatory Visit (INDEPENDENT_AMBULATORY_CARE_PROVIDER_SITE_OTHER): Payer: Medicare Other | Admitting: Family Medicine

## 2011-12-10 VITALS — BP 191/91 | HR 84 | Temp 98.7°F | Ht 64.0 in | Wt 329.0 lb

## 2011-12-10 DIAGNOSIS — R55 Syncope and collapse: Secondary | ICD-10-CM | POA: Insufficient documentation

## 2011-12-10 DIAGNOSIS — R413 Other amnesia: Secondary | ICD-10-CM | POA: Insufficient documentation

## 2011-12-10 DIAGNOSIS — I10 Essential (primary) hypertension: Secondary | ICD-10-CM

## 2011-12-10 LAB — COMPREHENSIVE METABOLIC PANEL
ALT: 47 U/L — ABNORMAL HIGH (ref 0–35)
AST: 29 U/L (ref 0–37)
Albumin: 4.3 g/dL (ref 3.5–5.2)
Alkaline Phosphatase: 90 U/L (ref 39–117)
BUN: 21 mg/dL (ref 6–23)
CO2: 26 mEq/L (ref 19–32)
Calcium: 9.4 mg/dL (ref 8.4–10.5)
Chloride: 104 mEq/L (ref 96–112)
Creat: 0.77 mg/dL (ref 0.50–1.10)
Glucose, Bld: 133 mg/dL — ABNORMAL HIGH (ref 70–99)
Potassium: 4.3 mEq/L (ref 3.5–5.3)
Sodium: 142 mEq/L (ref 135–145)
Total Bilirubin: 0.4 mg/dL (ref 0.3–1.2)
Total Protein: 7.2 g/dL (ref 6.0–8.3)

## 2011-12-10 LAB — LIPID PANEL
Cholesterol: 163 mg/dL (ref 0–200)
HDL: 78 mg/dL (ref >39)
LDL Cholesterol: 63 mg/dL (ref 0–99)
Total CHOL/HDL Ratio: 2.1 Ratio
Triglycerides: 112 mg/dL (ref <150)
VLDL: 22 mg/dL (ref 0–40)

## 2011-12-10 LAB — CBC
HCT: 42.5 % (ref 36.0–46.0)
Hemoglobin: 14 g/dL (ref 12.0–15.0)
MCH: 28.6 pg (ref 26.0–34.0)
MCHC: 32.9 g/dL (ref 30.0–36.0)
MCV: 86.7 fL (ref 78.0–100.0)
Platelets: 303 10*3/uL (ref 150–400)
RBC: 4.9 MIL/uL (ref 3.87–5.11)
RDW: 14.5 % (ref 11.5–15.5)
WBC: 8.5 10*3/uL (ref 4.0–10.5)

## 2011-12-10 LAB — TSH: TSH: 1.34 u[IU]/mL (ref 0.350–4.500)

## 2011-12-10 LAB — AMMONIA: Ammonia: 33 umol/L (ref 16–53)

## 2011-12-10 MED ORDER — CARVEDILOL 6.25 MG PO TABS: 6.2500 mg | ORAL_TABLET | Freq: Two times a day (BID) | ORAL | Status: DC

## 2011-12-10 NOTE — Assessment & Plan Note (Signed)
Not well controlled.  Will start low dose beta blocker and follow closely.  She is allergic to asa

## 2011-12-10 NOTE — Assessment & Plan Note (Signed)
New problem but has listed on problem list from the past.  Unsure of cause.   No signs of focal neurologic disorder or acute coronary artery disease.   Will check labs.   Give her risk factors strongly urged her to follow up with cardiology for workup as was scheduled 2 years ago.

## 2011-12-10 NOTE — Assessment & Plan Note (Signed)
New problem.  Many possibilities.   Of note she left without giving a urine sample for drug use  (she was aware this was ordered)  Will check labs and close follow up

## 2011-12-10 NOTE — Patient Instructions (Addendum)
You need to see Cardiology - we will set up an appointment  If you have sudden chest pain or shortness of breath or either is getting steadily worse then you need to go to the ER  I will call you if your lab tests are not normal.  Otherwise we will discuss them at your next visit.  Take one Losartan/HCTZ tablet a day and the Carvedilol twice daily   Keep checking your blood pressure and pulse write down the readings bring in next visit  Do not take any other medications

## 2011-12-10 NOTE — Progress Notes (Signed)
  Subjective:    Patient ID: [name], female    DOB: 12/17/1950, 61 y.o.   MRN: [id number]  HPI  Here to reestablish care since last visit in 2009.   Has seen Tomi Bamberger NP last 1 year ago Numerous complaints as follows  Syncope Believes she passed out about 3 weeks ago but is hazy on details.  Often feels lightheadness when standing or walking.  Some nearly constant chest pain with and without exertion.   No change in chest pain walking into today to office.  Has not had any work up for this episode.  Memory Trouble remembering details for months at least.  Family feels she is distracted.  Sometimes has trouble speaking.  No focal weakness or visual changes.    HYPERTENSION Disease Monitoring Home BP Monitoring 140-200/81-105 on various bp over last week Chest pain- no, not currently has had intermittent chest pain for years     Dyspnea-  yes, with exertion  Medications Compliance: taking 2 losartan hctz pills a day since her blood pressure has been up. Lightheadedness-  yes  Edema-  no   Pain Chronic constant pain in back and knees.     ROS - See HPI  [facility] Lab Review   Potassium  Date Value Range Status  03/05/2009 4.7 MARKED HEMOLYSIS  3.5-5.1 (mEq/L) Final     Sodium  Date Value Range Status  03/05/2009 139  135-145 (mEq/L) Final     Was admitted in 2010 for chest pain scheduled for outpatient work up but never went or followed up here or with cardiology  [facility] - denies any drug use.  Takes valium every now and then that she had left over from previous providers       Review of Systems     Objective:   Physical Exam Alert no acute distress Morbidly obese Heart - Regular rate and rhythm.  No murmurs, gallops or rubs.    Lungs:  Normal respiratory effort, chest expands symmetrically. Lungs are clear to auscultation, no crackles or wheezes. Extremities:  No cyanosis, edema, or deformity noted with good range of motion of all major joints significant lipidedema.     Neurologic exam : Cn 2-7 intact Strength equal & normal in upper & lower extremities Able to walk on heels and toes.   Balance normal  Romberg normal, finger to nose  Oriented x 3  EC - SR without signs of ischemia      Assessment & Plan:

## 2011-12-11 LAB — RPR

## 2011-12-17 ENCOUNTER — Ambulatory Visit (INDEPENDENT_AMBULATORY_CARE_PROVIDER_SITE_OTHER): Payer: Medicare Other | Admitting: Family Medicine

## 2011-12-17 VITALS — BP 174/99 | HR 65 | Temp 98.3°F | Ht 64.0 in | Wt 327.8 lb

## 2011-12-17 DIAGNOSIS — R739 Hyperglycemia, unspecified: Secondary | ICD-10-CM

## 2011-12-17 DIAGNOSIS — G4730 Sleep apnea, unspecified: Secondary | ICD-10-CM

## 2011-12-17 DIAGNOSIS — E669 Obesity, unspecified: Secondary | ICD-10-CM

## 2011-12-17 DIAGNOSIS — R55 Syncope and collapse: Secondary | ICD-10-CM

## 2011-12-17 DIAGNOSIS — I10 Essential (primary) hypertension: Secondary | ICD-10-CM

## 2011-12-17 DIAGNOSIS — R7309 Other abnormal glucose: Secondary | ICD-10-CM

## 2011-12-17 LAB — POCT GLYCOSYLATED HEMOGLOBIN (HGB A1C): Hemoglobin A1C: 6.4

## 2011-12-17 MED ORDER — HYDROCHLOROTHIAZIDE 12.5 MG PO CAPS: 12.5000 mg | ORAL_CAPSULE | Freq: Every day | ORAL | Status: DC

## 2011-12-17 NOTE — Patient Instructions (Signed)
Keep your appointment with Cardiology  If you have any severe or new or persistent chest pain go to the ER  We will check on your prior Sleep study and see if we need to repeat  IN the AM take one losartan/hctz tablet + one HCTZ 12.5 + Coreg tablet With dinner take one Coreg tablet  Losing weight by eating less is the key to your health

## 2011-12-17 NOTE — Assessment & Plan Note (Signed)
High risk for diabetes.  Will check A1c - again discussed need for weight control

## 2011-12-17 NOTE — Assessment & Plan Note (Signed)
Carries this diagnosis can not find copy of sleep study. Was told needed to use CPAP but never follow up.   Very likely has significant disease given her habitus.  If cardiology evaluation goes well will set up for repeat sleep study as treating this may help with many of her symptosm

## 2011-12-17 NOTE — Assessment & Plan Note (Signed)
No recurrence.  Has cardiology eval scheduled.

## 2011-12-17 NOTE — Progress Notes (Signed)
  Subjective:    Patient ID: [name], female    DOB: May 08, 1951, 61 y.o.   MRN: [id number]  HPI  Chest Pain Continues unchanged almost constant perhaps worse when she tries to lie down which makes her feel more pressure.  No change with exertion.   Has cardiology appointment   Syncope No further episodes  Sleep Apnea Was diagnosed years ago she can't remember when or where.  Thinks was told to wear mask but never followed up.  Does awake multiple times a night, cant lie flat has to sit up and feels tired most of the day with freq episodes of falling asleep  HYPERTENSION Disease Monitoring Home BP Monitoring brings in list of tid bp readings.  Diastolic 90-100s Systolic 150-180 pulse 60-88 Chest pain- yes, see above     Dyspnea-  yes, constant but worens with exertion  Medications Compliance: was taking coreg at lunch and dinner about 5 hours apart. Lightheadedness-  no  Edema-  no   ROS - See HPI  PMH Lab Review   Potassium  Date Value Range Status  12/10/2011 4.3  3.5-5.3 (mEq/L) Final     Sodium  Date Value Range Status  12/10/2011 142  135-145 (mEq/L) Final      Review of Symptoms - see HPI  PMH - Smoking status noted.       Review of Systems     Objective:   Physical Exam  Heart - Regular rate and rhythm.  No murmurs, gallops or rubs.    Lungs:  Normal respiratory effort, chest expands symmetrically. Lungs are clear to auscultation, no crackles or wheezes. Obese with thickend neck Extremities:  No cyanosis, edema, or deformity noted with good range of motion of all major joints.         Assessment & Plan:

## 2011-12-17 NOTE — Assessment & Plan Note (Signed)
Not controlled.  Will add addnl 12,5 mg of HCTZ.  Her HR is low enough not to increase Coreg.  Addressing sleep apnea may help.

## 2011-12-17 NOTE — Assessment & Plan Note (Signed)
Unchanged.  She focuses on inability to exercise as reason.  Discussed weight control is the key ot her health problems.  Addressing sleep apnea may help

## 2012-01-01 ENCOUNTER — Encounter: Payer: Self-pay | Admitting: Cardiology

## 2012-01-01 ENCOUNTER — Ambulatory Visit (INDEPENDENT_AMBULATORY_CARE_PROVIDER_SITE_OTHER): Payer: Medicare Other | Admitting: Cardiology

## 2012-01-01 VITALS — BP 150/100 | HR 85 | Ht 64.0 in | Wt 326.1 lb

## 2012-01-01 DIAGNOSIS — E669 Obesity, unspecified: Secondary | ICD-10-CM

## 2012-01-01 DIAGNOSIS — R0789 Other chest pain: Secondary | ICD-10-CM | POA: Insufficient documentation

## 2012-01-01 DIAGNOSIS — R072 Precordial pain: Secondary | ICD-10-CM

## 2012-01-01 DIAGNOSIS — I10 Essential (primary) hypertension: Secondary | ICD-10-CM

## 2012-01-01 DIAGNOSIS — R0600 Dyspnea, unspecified: Secondary | ICD-10-CM

## 2012-01-01 MED ORDER — CARVEDILOL 12.5 MG PO TABS: 12.5000 mg | ORAL_TABLET | Freq: Two times a day (BID) | ORAL | Status: DC

## 2012-01-01 MED ORDER — AMLODIPINE BESYLATE 5 MG PO TABS: 5.0000 mg | ORAL_TABLET | Freq: Every day | ORAL | Status: DC

## 2012-01-01 NOTE — Assessment & Plan Note (Signed)
This is a severe and life limiting problem. Continue to educate about diet and exercise.

## 2012-01-01 NOTE — Assessment & Plan Note (Signed)
This is somewhat atypical but she does have risk factors. She has a strong family history. She would need a stress test but would not be a little walk. Therefore, she will have a [employer].

## 2012-01-01 NOTE — Assessment & Plan Note (Signed)
Her blood pressure is not well controlled. I would suspect sleep apnea. Diagnosis and treatment of this will be important as will be weight loss. I would not use a combination of ACE inhibitor and ARB. I will. The enalapril start Norvasc 5 mg daily and increase her carvedilol.

## 2012-01-01 NOTE — Assessment & Plan Note (Signed)
I will check an echocardiogram. This is probably multifactorial.

## 2012-01-01 NOTE — Progress Notes (Signed)
HPI The patient presents for evaluation of chest discomfort, dyspnea and difficult to control hypertension. She reports her blood pressures been poorly controlled for some time. Her past history does include a cardiac catheterization which I was able to review along with an echocardiogram in 2007. She had no coronary disease and well-preserved ejection fraction. She does have dyspnea. This has been slowly progressive. She gets dyspneic with activities such as walking on level ground. She does sleep in a recliner to improve her breathing. She was told she had sleep apnea in the past but it was mild and not treated. She gets chest discomfort. Some of this is constant. Some of it is positional with activity such as bending over. Other times she gets chest discomfort with activities. This has been for the last couple of months. She describes a midsternal discomfort. She doesn't describe radiation or associated symptoms. She did have a syncopal episode about 3 weeks ago while walking in her yard with her grandchild. She did not seek medical treatment for this and has not since had any syncope. She will occasionally get dizzy and describes some orthostatic symptoms  Allergies  Allergen Reactions  . Amoxicillin     REACTION: unspecified  . Aspirin     REACTION: unspecified  . Meperidine Hcl     REACTION: unspecified    Current Outpatient Prescriptions  Medication Sig Dispense Refill  . carvedilol (COREG) 6.25 MG tablet Take 1 tablet (6.25 mg total) by mouth 2 (two) times daily with a meal.  60 tablet  0  . diazepam (VALIUM) 5 MG tablet Take 5 mg by mouth 2 (two) times daily.      . hydrochlorothiazide (MICROZIDE) 12.5 MG capsule Take 1 capsule (12.5 mg total) by mouth daily. Take with losartan tablet every AM.  30 capsule  0  . losartan-hydrochlorothiazide (HYZAAR) 100-12.5 MG per tablet Take 1 tablet by mouth daily.      . enalapril (VASOTEC) 10 MG tablet Take 10 mg by mouth daily.      Marland Kitchen  imipramine (TOFRANIL) 25 MG tablet Take 25 mg by mouth at bedtime.      Marland Kitchen morphine (MS CONTIN) 15 MG 12 hr tablet Take 15 mg by mouth 2 (two) times daily.      Marland Kitchen omeprazole (PRILOSEC) 20 MG capsule Take 20 mg by mouth daily.        Past Medical History  Diagnosis Date  . Hypertension   . Other abnormal glucose   . Obesity, unspecified   . Unspecified sleep apnea   . Syncope and collapse   . Chest pain   . Suicide attempt 1996  . Fatty liver 6/03  . Lung disease     Past Surgical History  Procedure Date  . Cardiac catheterization   . Cholecystectomy   . Tubal ligation     No family history on file.  History   Social History  . Marital Status: Divorced    Spouse Name: N/A    Number of Children: N/A  . Years of Education: N/A   Occupational History  . Not on file.   Social History Main Topics  . Smoking status: Former Smoker    Quit date: 01/01/1992  . Smokeless tobacco: Not on file  . Alcohol Use: No  . Drug Use: Not on file  . Sexually Active: Not on file   Other Topics Concern  . Not on file   Social History Narrative  . No narrative on file  ROS:  Positive for headaches, dizziness, nausea, leg pain and numbness, asthma. Otherwise as stated in the history of present illness and negative for all other systems.  PHYSICAL EXAM BP 150/100  Pulse 85  Ht 5\' 4"  (1.626 m)  Wt 326 lb 1.9 oz (147.927 kg)  BMI 55.98 kg/m2 GENERAL:  Well appearing HEENT:  Pupils equal round and reactive, fundi not visualized, oral mucosa unremarkable, dentures NECK:  No jugular venous distention, waveform within normal limits, carotid upstroke brisk and symmetric, no bruits, no thyromegaly LYMPHATICS:  No cervical, inguinal adenopathy LUNGS:  Clear to auscultation bilaterally BACK:  No CVA tenderness CHEST:  Unremarkable HEART:  PMI not displaced or sustained,S1 and S2 within normal limits, no S3, no S4, no clicks, no rubs, no murmurs ABD:  Flat, positive bowel sounds normal  in frequency in pitch, no bruits, no rebound, no guarding, no midline pulsatile mass, no hepatomegaly, no splenomegaly, morbidly obese EXT:  2 plus pulses throughout, no edema, no cyanosis no clubbing SKIN:  No rashes no nodules NEURO:  Cranial nerves II through XII grossly intact, motor grossly intact throughout PSYCH:  Cognitively intact, oriented to person place and time  EKG:  Sinus rhythm, rate 79, axis within normal limits, intervals within normal limits, no acute ST-T wave changes. 12/10/11  ASSESSMENT AND PLAN

## 2012-01-01 NOTE — Patient Instructions (Signed)
Please stop your Enalapril Start Norvasc (amlodipine) 5 mg a day Increase you Carvedilol to 12.5 mg twice a day  Your physician has requested that you have an echocardiogram. Echocardiography is a painless test that uses sound waves to create images of your heart. It provides your doctor with information about the size and shape of your heart and how well your heart's chambers and valves are working. This procedure takes approximately one hour. There are no restrictions for this procedure.  Your physician has requested that you have a lexiscan myoview. For further information please visit [email or web address] Please follow instruction sheet, as given.  Follow up with Dr Antoine Poche after testing.

## 2012-01-08 ENCOUNTER — Ambulatory Visit (HOSPITAL_COMMUNITY): Payer: Medicare Other | Attending: Cardiology | Admitting: Radiology

## 2012-01-08 VITALS — BP 165/77 | HR 58 | Ht 64.0 in | Wt 326.0 lb

## 2012-01-08 DIAGNOSIS — R072 Precordial pain: Secondary | ICD-10-CM | POA: Insufficient documentation

## 2012-01-08 DIAGNOSIS — R0602 Shortness of breath: Secondary | ICD-10-CM

## 2012-01-08 DIAGNOSIS — R079 Chest pain, unspecified: Secondary | ICD-10-CM

## 2012-01-08 DIAGNOSIS — R55 Syncope and collapse: Secondary | ICD-10-CM

## 2012-01-08 MED ORDER — TECHNETIUM TC 99M TETROFOSMIN IV KIT
33.0000 | PACK | Freq: Once | INTRAVENOUS | Status: AC | PRN
  Administered 2012-01-08: 33 via INTRAVENOUS

## 2012-01-08 MED ORDER — REGADENOSON 0.4 MG/5ML IV SOLN
0.4000 mg | Freq: Once | INTRAVENOUS | Status: AC
  Administered 2012-01-08: 0.4 mg via INTRAVENOUS

## 2012-01-08 NOTE — Progress Notes (Signed)
[facility] SITE 3 NUCLEAR MED [address] [place] [address] [phone]  Cardiology Nuclear Med Study  [name] is a 61 y.o. female     MRN : [id number]     DOB: [birth year]  Procedure Date: 01/08/2012  Nuclear Med Background Indication for Stress Test:  Evaluation for Ischemia History:  '05 MPS:No ischemia, EF=75%; '07 Echo:Normal LVF; '07 Cath:Normal Coronaries, EF=60%; Cardiac Risk Factors: Family History - CAD, History of Smoking, Hypertension, NIDDM and Obesity  Symptoms:  Chest Pain/Pressure with and without Exertion (last episode of chest discomfort: now, 4/10), Diaphoresis, Dizziness, DOE, Nausea, Near Syncope/Syncope (last episode of syncope was 3-weeks ago) and Rapid HR    Nuclear Pre-Procedure Caffeine/Decaff Intake:  None NPO After: 7:00pm   Lungs:  Clear. O2 Sat: 97% on room air. IV 0.9% NS with Angio Cath:  22g  IV Site: R Hand  IV Started by:  Stanton Kidney, EMT-P  Chest Size (in):  60 Cup Size: DDD  Height: 5\' 4"  (1.626 m)  Weight:  326 lb (147.873 kg)  BMI:  Body mass index is 55.96 kg/(m^2). Tech Comments:  Meds were held this am, per patient.    Nuclear Med Study 1 or 2 day study: 2 day  Stress Test Type:  Lexiscan  Reading MD: Willa Rough, MD  Order Authorizing Provider:  Rollene Rotunda, MD  Resting Radionuclide: Technetium 1m Tetrofosmin  Resting Radionuclide Dose: 33.0 mCi  01/13/12  Stress Radionuclide:  Technetium 5m Tetrofosmin  Stress Radionuclide Dose: 33.0 mCi  01/08/12          Stress Protocol Rest HR: 58 Stress HR: 88  Rest BP: 165/77 Stress BP: 175/66  Exercise Time (min): n/a METS: n/a   Predicted Max HR: 159 bpm % Max HR: 55.35 bpm Rate Pressure Product: 21308   Dose of Adenosine (mg):  n/a Dose of Lexiscan: 0.4 mg  Dose of Atropine (mg): n/a Dose of Dobutamine: n/a mcg/kg/min (at max HR)  Stress Test Technologist: Smiley Houseman, CMA-N  Nuclear Technologist:  Domenic Polite, CNMT     Rest Procedure:   Myocardial perfusion imaging was performed at rest 45 minutes following the intravenous administration of Technetium 63m Tetrofosmin.  Rest ECG: No acute changes  Stress Procedure:  The patient received IV Lexiscan 0.4 mg over 15-seconds.  Technetium 6m Tetrofosmin injected at 30-seconds.  There were no significant changes with Lexiscan.  Quantitative spect images were obtained after a 45 minute delay.  Stress ECG: No significant change from baseline ECG  QPS Raw Data Images:  Normal; no motion artifact; normal heart/lung ratio. Stress Images:  Normal homogeneous uptake in all areas of the myocardium. Rest Images:  Normal homogeneous uptake in all areas of the myocardium. Subtraction (SDS):  No evidence of ischemia. Transient Ischemic Dilatation (Normal <1.22):  0.88 Lung/Heart Ratio (Normal <0.45):  0.47  Quantitative Gated Spect Images QGS EDV:  105 ml QGS ESV:  37 ml  Impression Exercise Capacity:  Lexiscan with no exercise. BP Response:  Normal blood pressure response. Clinical Symptoms:  Chest pain during and after stress. ECG Impression:  No significant ST segment change suggestive of ischemia. Comparison with Prior Nuclear Study: No images to compare  Overall Impression:  Normal stress nuclear study.  LV Ejection Fraction: 65%.  LV Wall Motion:  NL LV Function; NL Wall Motion  [employer]

## 2012-01-09 ENCOUNTER — Ambulatory Visit (HOSPITAL_COMMUNITY): Payer: Medicare Other | Attending: Cardiology

## 2012-01-09 ENCOUNTER — Other Ambulatory Visit: Payer: Self-pay

## 2012-01-09 DIAGNOSIS — R0602 Shortness of breath: Secondary | ICD-10-CM

## 2012-01-09 DIAGNOSIS — I517 Cardiomegaly: Secondary | ICD-10-CM | POA: Insufficient documentation

## 2012-01-09 DIAGNOSIS — R072 Precordial pain: Secondary | ICD-10-CM

## 2012-01-09 DIAGNOSIS — R0609 Other forms of dyspnea: Secondary | ICD-10-CM | POA: Insufficient documentation

## 2012-01-09 DIAGNOSIS — I10 Essential (primary) hypertension: Secondary | ICD-10-CM | POA: Insufficient documentation

## 2012-01-09 DIAGNOSIS — R55 Syncope and collapse: Secondary | ICD-10-CM | POA: Insufficient documentation

## 2012-01-09 DIAGNOSIS — R0989 Other specified symptoms and signs involving the circulatory and respiratory systems: Secondary | ICD-10-CM | POA: Insufficient documentation

## 2012-01-09 DIAGNOSIS — I519 Heart disease, unspecified: Secondary | ICD-10-CM | POA: Insufficient documentation

## 2012-01-13 ENCOUNTER — Ambulatory Visit (HOSPITAL_COMMUNITY): Payer: Medicare Other | Attending: Cardiology | Admitting: Radiology

## 2012-01-13 DIAGNOSIS — R0989 Other specified symptoms and signs involving the circulatory and respiratory systems: Secondary | ICD-10-CM

## 2012-01-13 MED ORDER — TECHNETIUM TC 99M TETROFOSMIN IV KIT
33.0000 | PACK | Freq: Once | INTRAVENOUS | Status: AC | PRN
  Administered 2012-01-13: 33 via INTRAVENOUS

## 2012-01-14 ENCOUNTER — Encounter: Payer: Self-pay | Admitting: Family Medicine

## 2012-01-14 ENCOUNTER — Ambulatory Visit (INDEPENDENT_AMBULATORY_CARE_PROVIDER_SITE_OTHER): Payer: Medicare Other | Admitting: Family Medicine

## 2012-01-14 VITALS — BP 172/93 | HR 72 | Temp 98.3°F | Ht 64.0 in | Wt 326.0 lb

## 2012-01-14 DIAGNOSIS — E669 Obesity, unspecified: Secondary | ICD-10-CM

## 2012-01-14 DIAGNOSIS — R55 Syncope and collapse: Secondary | ICD-10-CM

## 2012-01-14 DIAGNOSIS — G4730 Sleep apnea, unspecified: Secondary | ICD-10-CM

## 2012-01-14 DIAGNOSIS — R7309 Other abnormal glucose: Secondary | ICD-10-CM

## 2012-01-14 DIAGNOSIS — R413 Other amnesia: Secondary | ICD-10-CM

## 2012-01-14 DIAGNOSIS — I10 Essential (primary) hypertension: Secondary | ICD-10-CM

## 2012-01-14 DIAGNOSIS — R739 Hyperglycemia, unspecified: Secondary | ICD-10-CM

## 2012-01-14 MED ORDER — TRIAMCINOLONE ACETONIDE 0.1 % EX OINT: TOPICAL_OINTMENT | Freq: Two times a day (BID) | CUTANEOUS | Status: DC

## 2012-01-14 NOTE — Assessment & Plan Note (Signed)
Very likely.  Will order sleep study

## 2012-01-14 NOTE — Assessment & Plan Note (Addendum)
Not well controlled.  She was confused about her medications and was not taking hctz/lorsartan.  Asked her to start this and use a pill box.  Will follow

## 2012-01-14 NOTE — Patient Instructions (Signed)
Bring in every medication you have at home - all old bottles and pills - to see me and when you see Dr Antoine Poche  For your blood pressure you should be taking 1. Amlodipine 5 mg at night 2. Carvedilol 12.5 mg twice daily at 8 AM and 8 PM 3. Combination Losartan and HCTZ one at 8 AM  Get a pill box and load it once a week   Make an appointment to see our Health Coach -Rosalita Chessman   We will set you up an appointment for a sleep study

## 2012-01-14 NOTE — Assessment & Plan Note (Addendum)
Unchanged No abnormalities on lab testing.  No signs of CNS diease.  Likely sleep apnea.   If does not improve with treatment of this may need neuro imaging

## 2012-01-14 NOTE — Assessment & Plan Note (Signed)
Borderline A1c and is at very high risk due to weight and fhx.  Hopefully can lose weight

## 2012-01-14 NOTE — Progress Notes (Signed)
  Subjective:    Patient ID: [name], female    DOB: 1950/09/21, 61 y.o.   MRN: [id number]  HPI HYPERTENSION Disease Monitoring Home BP Monitoring 150-70s/70-90s HR 50-80 Chest pain- yes, her usual     Dyspnea-  yes, her usual mainly with exertions  Medications Compliance: only taking carvedilol and amlodipine did not think she was supposed to take lorsartan/hctz. Lightheadedness-  yes, intermittent espeically when rolls over at night  Edema-  no   ROS - See HPI  PMH Lab Review   Potassium  Date Value Range Status  12/10/2011 4.3  3.5-5.3 (mEq/L) Final     Sodium  Date Value Range Status  12/10/2011 142  135-145 (mEq/L) Final    PMH Reviewed recent echo and Myoview Stress test - no signs of coronary artery disease mild LVH and diastolic dysfunction   Sleep Apnea Has day time sleepiness, significant fatigue, forgetfulness restless sleep and snoring for years.  Definite risk factors of obesity      Review of Systems     Objective:   Physical Exam  Weight is unchanged  Heart - Regular rate and rhythm.  No murmurs, gallops or rubs.    Lungs:  Normal respiratory effort, chest expands symmetrically. Lungs are clear to auscultation, no crackles or wheezes. Extremities:  No cyanosis, edema, or deformity noted with good range of motion of all major joints.          Assessment & Plan:

## 2012-01-14 NOTE — Assessment & Plan Note (Signed)
No further episodes.  No evidence of coronary artery disease.  No signs symptoms of focal neurological disease.  Will monitor

## 2012-01-14 NOTE — Assessment & Plan Note (Signed)
Unchanged.  Test and hopefully treat for sleep apnea.  Appointment with our health coach for behavior change.  This is the key to her health

## 2012-01-19 ENCOUNTER — Ambulatory Visit (INDEPENDENT_AMBULATORY_CARE_PROVIDER_SITE_OTHER): Payer: Medicare Other | Admitting: Home Health Services

## 2012-01-19 ENCOUNTER — Encounter: Payer: Self-pay | Admitting: Home Health Services

## 2012-01-19 VITALS — BP 123/77 | HR 62 | Temp 98.5°F | Ht 64.0 in | Wt 325.4 lb

## 2012-01-19 DIAGNOSIS — Z0000 Encounter for general adult medical examination without abnormal findings: Secondary | ICD-10-CM

## 2012-01-19 NOTE — Patient Instructions (Signed)
1. Continue to work on cutting back portions to half. 2. Continue to walk around your home after every meal. 3. Consider getting your mammogram and  pap smear. 4. Continue weekly telephone calls with Rosalita Chessman, [facility], for weight loss.

## 2012-01-19 NOTE — Progress Notes (Signed)
Patient here for annual wellness visit, patient reports: Risk Factors/Conditions needing evaluation or treatment: Pt does not have any risk factors that need evaluation. Home Safety: Pt lives with friend, daughter, and 2 grandchildren in 1 story home.  Pt reports having smoke detectors and does not have adaptive equipment Other Information: Corrective lens: Pt reports not needing corrective lens.  Pt [facility] eye dr annually at [facility] Dentures: Pt has full dentures. Memory: Pt reports some memory problems. Patient's Mini Mental Score (recorded in doc. flowsheet): 28 Pt reports no hearing problems.   Balance/Gait: Pt reports pain in hips and back, which prevent her from exercising . Pt reports weakness in legs and some dizziness. Balance Abnormal Patient value  Sitting balance    Sit to stand x Uses arms  Attempts to arise    Immediate standing balance x Dizzy longer then 10 seconds  Standing balance    Nudge    Eyes closed- Romberg x dizzy  Tandem stance    Back lean x Not much range of motion  Neck Rotation x   360 degree turn x dizzy  Sitting down x Uses arms   Gait Abnormal Patient value  Initiation of gait    Step length-left    Step length-right    Step height-left    Step height-right    Step symmetry    Step continuity    Path deviation    Trunk movement    Walking stance x Wide base      Annual Wellness Visit Requirements Recorded Today In  Medical, family, social history Past Medical, Family, Social History Section  Current providers Care team  Current medications Medications  Wt, BP, Ht, BMI Vital signs  Hearing assessment (welcome visit) Notes  Tobacco, alcohol, illicit drug use History  ADL Nurse Assessment  Depression Screening Nurse Assessment  Cognitive impairment Nurse Assessment  Mini Mental Status Document Flowsheet  Fall Risk Nurse Assessment  Home Safety Progress Note  End of Life Planning (welcome visit) Social Documentation  Medicare  preventative services Progress Note  Risk factors/conditions needing evaluation/treatment Progress Note  Personalized health advice Patient Instructions, goals, letter  Diet & Exercise Social Documentation  Emergency Contact Social Documentation  Seat Belts Social Documentation  Sun exposure/protection Social Documentation    Prevention Plan:   Recommended Medicare Prevention Screenings Women over 65 Test For Frequency Date of Last- BOLD if needed  Breast Cancer 1-2 yrs discussed- pt not interested  Cervical Cancer 1-3 yrs Dicussed- pt not interested  Colorectal Cancer 1-10 yrs Dicussed-pt not interested  Osteoporosis once dicussed  Cholesterol 5 yrs 4/13  Diabetes yearly 4/13  HIV yearly declined  Influenza Shot yearly Pt declined  Pneumonia Shot once   Zostavax Shot once    I have reviewed this visit and discussed with Arlys John and agree with her documentation. CHAMBLISS,MARSHALL L

## 2012-01-22 ENCOUNTER — Encounter: Payer: Self-pay | Admitting: Home Health Services

## 2012-02-03 ENCOUNTER — Encounter (HOSPITAL_BASED_OUTPATIENT_CLINIC_OR_DEPARTMENT_OTHER): Payer: Medicare Other

## 2012-02-05 ENCOUNTER — Ambulatory Visit: Payer: Medicare Other | Admitting: Cardiology

## 2012-02-08 ENCOUNTER — Ambulatory Visit (HOSPITAL_BASED_OUTPATIENT_CLINIC_OR_DEPARTMENT_OTHER): Payer: Medicare Other | Attending: Family Medicine | Admitting: General Practice

## 2012-02-08 VITALS — Ht 64.0 in | Wt 325.0 lb

## 2012-02-08 DIAGNOSIS — R0609 Other forms of dyspnea: Secondary | ICD-10-CM | POA: Insufficient documentation

## 2012-02-08 DIAGNOSIS — R0989 Other specified symptoms and signs involving the circulatory and respiratory systems: Secondary | ICD-10-CM | POA: Insufficient documentation

## 2012-02-08 DIAGNOSIS — G4730 Sleep apnea, unspecified: Secondary | ICD-10-CM

## 2012-02-08 DIAGNOSIS — G4733 Obstructive sleep apnea (adult) (pediatric): Secondary | ICD-10-CM

## 2012-02-14 DIAGNOSIS — R0609 Other forms of dyspnea: Secondary | ICD-10-CM

## 2012-02-14 DIAGNOSIS — G4733 Obstructive sleep apnea (adult) (pediatric): Secondary | ICD-10-CM

## 2012-02-14 DIAGNOSIS — R0989 Other specified symptoms and signs involving the circulatory and respiratory systems: Secondary | ICD-10-CM

## 2012-02-14 NOTE — Procedures (Signed)
NAMEFLONNIE, [name]                  ACCOUNT NO.:  [id number]  MEDICAL RECORD NO.:  [id number]          PATIENT TYPE:  OUT  LOCATION:  SLEEP CENTER                 FACILITY:  [facility]  PHYSICIAN:  Emmilia Sowder D. Maple Hudson, MD, FCCP, FACPDATE OF BIRTH:  12/26/1950  DATE OF STUDY:  02/08/2012                           NOCTURNAL POLYSOMNOGRAM  REFERRING PHYSICIAN:  Pearlean Brownie, M.D.  INDICATION FOR STUDY:  Hypersomnia with sleep apnea.  EPWORTH SLEEPINESS SCORE:  16/24.  BMI 55.8.  Weight 325 pounds.  Height 64 inches.  Neck 18 inches.  MEDICATIONS:  Home medications are charted and reviewed.  SLEEP ARCHITECTURE:  Total sleep time 294 minutes with sleep efficiency 79.9%.  Stage I was 14.8%, stage II 46.3%, stage III 21.3%, REM 17.7% of total sleep time.  Sleep latency 1 minute.  REM latency 81 minutes. Awake after sleep onset 73 minutes.  Arousal index 6.3.  BEDTIME MEDICATION:  None.  RESPIRATORY DATA:  Apnea-hypopnea index (AHI) 6.5 per hour.  A total of 32 events was scored including 3 obstructive apneas and 29 hypopneas. Events were not positional.  REM AHI 33.5 per hour.  There were insufficient numbers of events to permit application of split protocol, CPAP titration on this study night.  OXYGEN DATA:  Moderate snoring with oxygen desaturation to a nadir of 76% and mean oxygen saturation through the study of 92.7% on room air.  CARDIAC DATA:  Sinus rhythm with rare PVC.  MOVEMENT-PARASOMNIA:  A total of 12 limb jerks were counted, of which 1 was associated with arousal or awakening for periodic limb movement with arousal index of 0.2 per hour which is of doubtful significance. Bathroom x1.  IMPRESSIONS-RECOMMENDATIONS: 1. Mild obstructive sleep apnea/hypopnea syndrome, AHI 6.5 per hour.     Non-positional events.  Moderate snoring with oxygen desaturation     to a nadir of 76% and mean oxygen saturation through the study of     92.7% on room air.  During the total recording  time, 17.6 minutes     were recorded with saturation less than 90% and 12.5 minutes     recorded with saturation less than 88% on room air.  Consider if     evaluation for home oxygen during sleep would be clinically     appropriate. 2. Scores in this range are not usually addressed with continuous     positive airway pressure unless weight loss and other conservative     management is unsuccessful.     Makinzy Cleere D. Maple Hudson, MD, [facility], FACP Diplomate, American Board of Sleep Medicine    CDY/MEDQ  D:  02/14/2012 08:41:23  T:  02/14/2012 11:04:10  Job:  [id number]

## 2012-02-24 ENCOUNTER — Encounter (HOSPITAL_BASED_OUTPATIENT_CLINIC_OR_DEPARTMENT_OTHER): Payer: Medicare Other

## 2012-03-04 ENCOUNTER — Telehealth: Payer: Self-pay | Admitting: Family Medicine

## 2012-03-04 NOTE — Telephone Encounter (Signed)
Its under the notes tab.  If this is still not what your are looking for let me know and I will call the sleep center. Fleeger, [name]

## 2012-03-04 NOTE — Telephone Encounter (Signed)
Please call Ms. Short back with the results from her sleep study.  Have not heard since her appt last month.

## 2012-03-04 NOTE — Telephone Encounter (Signed)
Sleep Study in Epic under Procedures does not have an interpretation (that I see).  Please call Sleep Center and ask them to add or tell us the interpretaion  Thanks  LC

## 2012-03-05 NOTE — Telephone Encounter (Signed)
LMOVM for pt to call back.  Fleeger, Jessica Dawn  

## 2012-03-05 NOTE — Telephone Encounter (Signed)
Pt informed. Fleeger, Jessica Dawn  

## 2012-03-05 NOTE — Telephone Encounter (Signed)
Thanks - you are very valuable!!  Please let her know it showed she did not need CPAP but did need to continue to work on weight loss to improve her breathing   LC

## 2012-04-09 ENCOUNTER — Other Ambulatory Visit: Payer: Self-pay | Admitting: Family Medicine

## 2012-04-13 ENCOUNTER — Other Ambulatory Visit: Payer: Self-pay | Admitting: Family Medicine

## 2012-04-13 DIAGNOSIS — I10 Essential (primary) hypertension: Secondary | ICD-10-CM

## 2012-04-13 MED ORDER — LOSARTAN POTASSIUM-HCTZ 100-12.5 MG PO TABS: 1.0000 | ORAL_TABLET | Freq: Every day | ORAL | Status: DC

## 2012-04-13 NOTE — Telephone Encounter (Signed)
Pt was taking    losartan-hydrochlorothiazide (HYZAAR) 100-12.5 MG   And is now out - she is asking for refill from Dr Deirdre Priest [facility] [address]  pls let patient know

## 2012-04-13 NOTE — Telephone Encounter (Signed)
Left message for patient to check with her pharmacy for med refill.  Gaylene Brooks, RN

## 2012-04-13 NOTE — Telephone Encounter (Signed)
Will  forward this message to preceptor Dr. Earnest Bailey to make sure OK to refill now.

## 2012-04-13 NOTE — Telephone Encounter (Signed)
Patient states Dr. Tomi Bamberger initially Rx'd Hyzaar, but has moved to [facility].  Patient is out of med and needs a refill.  Will route request to preceptor (Dr. Earnest Bailey) since Dr. Deirdre Priest is out of office this week.  Gaylene Brooks, RN

## 2012-04-13 NOTE — Telephone Encounter (Signed)
I have sent in refill to her pharmacy.  Please let patient know.

## 2012-05-23 ENCOUNTER — Encounter (HOSPITAL_COMMUNITY): Payer: Self-pay | Admitting: Emergency Medicine

## 2012-05-23 ENCOUNTER — Emergency Department (HOSPITAL_COMMUNITY)
Admission: EM | Admit: 2012-05-23 | Discharge: 2012-05-23 | Disposition: A | Discharge status: Home / Self Care | Payer: Medicare Other | Source: Home / Self Care | Attending: Emergency Medicine | Admitting: Emergency Medicine

## 2012-05-23 DIAGNOSIS — N390 Urinary tract infection, site not specified: Secondary | ICD-10-CM

## 2012-05-23 LAB — POCT URINALYSIS DIP (DEVICE)
Glucose, UA: 100 mg/dL — AB
Nitrite: POSITIVE — AB
Protein, ur: >=300 mg/dL — AB
Specific Gravity, Urine: 1.015 (ref 1.005–1.030)
Urobilinogen, UA: 2 mg/dL — ABNORMAL HIGH (ref 0.0–1.0)
pH: 5 (ref 5.0–8.0)

## 2012-05-23 MED ORDER — CIPROFLOXACIN HCL 250 MG PO TABS: 250.0000 mg | ORAL_TABLET | Freq: Two times a day (BID) | ORAL | Status: DC

## 2012-05-23 MED ORDER — OXYCODONE HCL 10 MG PO TABS: 10.0000 mg | ORAL_TABLET | Freq: Four times a day (QID) | ORAL | Status: DC | PRN

## 2012-05-23 NOTE — ED Provider Notes (Signed)
Chief Complaint  Patient presents with  . Urinary Frequency    History of Present Illness:   [name] is a 61 year old female with a 2 month history of urinary symptoms. They been getting worse over the past several days. She describes dysuria, frequency, urgency, bladder pressure and pain, and today she has right CVA pain. She describes chills and feeling feverish. She's felt nauseated but has not vomited. She has had urinary tract infections in the past. Most recently about 5 months ago. She denies any GYN complaints.  Review of Systems:  Other than noted above, the patient denies any of the following symptoms: General:  No fevers, chills, sweats, aches, or fatigue. GI:  No abdominal pain, back pain, nausea, vomiting, diarrhea, or constipation. GU:  No dysuria, frequency, urgency, hematuria, or incontinence. GYN:  No discharge, itching, vulvar pain or lesions, pelvic pain, or abnormal vaginal bleeding.  PMFSH:  Past medical history, family history, social history, meds, and allergies were reviewed.  Physical Exam:   Vital signs:  BP 191/76  Pulse 86  Temp 98.1 F (36.7 C) (Oral)  Resp 18  SpO2 97% Gen:  Alert, oriented, in no distress. Lungs:  Clear to auscultation, no wheezes, rales or rhonchi. Heart:  Regular rhythm, no gallop or murmer. Abdomen:  Flat and soft. There was slight suprapubic pain to palpation.  No guarding, or rebound.  No hepato-splenomegaly or mass.  Bowel sounds were normally active.  No hernia. Back:  There is CVA tenderness on the right than on the left.  Skin:  Clear, warm and dry.  Labs:   Results for orders placed during the hospital encounter of 05/23/12  POCT URINALYSIS DIP (DEVICE)      Component Value Range   Glucose, UA 100 (*) NEGATIVE mg/dL   Bilirubin Urine SMALL (*) NEGATIVE   Ketones, ur TRACE (*) NEGATIVE mg/dL   Specific Gravity, Urine 1.015  1.005 - 1.030   Hgb urine dipstick LARGE (*) NEGATIVE   pH 5.0  5.0 - 8.0   Protein, ur >=300  (*) NEGATIVE mg/dL   Urobilinogen, UA 2.0 (*) 0.0 - 1.0 mg/dL   Nitrite POSITIVE (*) NEGATIVE   Leukocytes, UA LARGE (*) NEGATIVE     Other Labs Obtained at Urgent Care Center:  A urine culture was obtained.  Results are pending at this time and we will call about any positive results.  Assessment: The encounter diagnosis was UTI (lower urinary tract infection).   She may have early acute pyelonephritis. If she has difficulty keeping the Cipro on her stomach, I suggested she return to the emergency department immediately.  Plan:   1.  The following meds were prescribed:   New Prescriptions   CIPROFLOXACIN (CIPRO) 250 MG TABLET    Take 1 tablet (250 mg total) by mouth every 12 (twelve) hours.   OXYCODONE HCL 10 MG TABS    Take 1 tablet (10 mg total) by mouth every 6 (six) hours as needed.   2.  The patient was instructed in symptomatic care and handouts were given. 3.  The patient was told to return if becoming worse in any way, if no better in 3 or 4 days, and given some red flag symptoms that would indicate earlier return. 4.  The patient was told to avoid intercourse for 10 days, get extra fluids, and return for a follow up with her primary care doctor at the completion of treatment for a repeat UA and culture.     Dineen Kid  Lorenz Coaster, MD 05/23/12 1930

## 2012-05-26 LAB — URINE CULTURE
Colony Count: >=100000
Special Requests: NORMAL

## 2012-05-27 NOTE — ED Notes (Signed)
Urine culture: >100,000 colonies Proteus Mirabilis.  Pt. adequately treated with Cipro. [name] 05/27/2012

## 2012-08-14 ENCOUNTER — Other Ambulatory Visit: Payer: Self-pay | Admitting: Family Medicine

## 2012-10-27 ENCOUNTER — Emergency Department (HOSPITAL_COMMUNITY): Payer: Medicare Other

## 2012-10-27 ENCOUNTER — Inpatient Hospital Stay (HOSPITAL_COMMUNITY): Payer: Medicare Other

## 2012-10-27 ENCOUNTER — Observation Stay (HOSPITAL_COMMUNITY)
Admission: EM | Admit: 2012-10-27 | Discharge: 2012-10-29 | DRG: 300 | Disposition: A | Discharge status: Home Health | Payer: Medicare Other | Attending: Family Medicine | Admitting: Family Medicine

## 2012-10-27 ENCOUNTER — Encounter (HOSPITAL_COMMUNITY): Payer: Self-pay | Admitting: Emergency Medicine

## 2012-10-27 DIAGNOSIS — R0789 Other chest pain: Secondary | ICD-10-CM | POA: Diagnosis present

## 2012-10-27 DIAGNOSIS — G4730 Sleep apnea, unspecified: Secondary | ICD-10-CM | POA: Diagnosis present

## 2012-10-27 DIAGNOSIS — M79609 Pain in unspecified limb: Secondary | ICD-10-CM

## 2012-10-27 DIAGNOSIS — R739 Hyperglycemia, unspecified: Secondary | ICD-10-CM

## 2012-10-27 DIAGNOSIS — I517 Cardiomegaly: Secondary | ICD-10-CM

## 2012-10-27 DIAGNOSIS — I10 Essential (primary) hypertension: Secondary | ICD-10-CM | POA: Diagnosis present

## 2012-10-27 DIAGNOSIS — M7989 Other specified soft tissue disorders: Secondary | ICD-10-CM | POA: Insufficient documentation

## 2012-10-27 DIAGNOSIS — L03114 Cellulitis of left upper limb: Secondary | ICD-10-CM

## 2012-10-27 DIAGNOSIS — Z6841 Body Mass Index (BMI) 40.0 and over, adult: Secondary | ICD-10-CM | POA: Insufficient documentation

## 2012-10-27 DIAGNOSIS — I4510 Unspecified right bundle-branch block: Secondary | ICD-10-CM | POA: Insufficient documentation

## 2012-10-27 DIAGNOSIS — R0989 Other specified symptoms and signs involving the circulatory and respiratory systems: Secondary | ICD-10-CM

## 2012-10-27 DIAGNOSIS — R0609 Other forms of dyspnea: Secondary | ICD-10-CM

## 2012-10-27 DIAGNOSIS — I82629 Acute embolism and thrombosis of deep veins of unspecified upper extremity: Principal | ICD-10-CM

## 2012-10-27 DIAGNOSIS — E669 Obesity, unspecified: Secondary | ICD-10-CM

## 2012-10-27 DIAGNOSIS — E119 Type 2 diabetes mellitus without complications: Secondary | ICD-10-CM

## 2012-10-27 DIAGNOSIS — I82622 Acute embolism and thrombosis of deep veins of left upper extremity: Secondary | ICD-10-CM

## 2012-10-27 DIAGNOSIS — H9209 Otalgia, unspecified ear: Secondary | ICD-10-CM | POA: Insufficient documentation

## 2012-10-27 DIAGNOSIS — R928 Other abnormal and inconclusive findings on diagnostic imaging of breast: Secondary | ICD-10-CM | POA: Insufficient documentation

## 2012-10-27 DIAGNOSIS — Z79899 Other long term (current) drug therapy: Secondary | ICD-10-CM | POA: Insufficient documentation

## 2012-10-27 DIAGNOSIS — R918 Other nonspecific abnormal finding of lung field: Secondary | ICD-10-CM

## 2012-10-27 DIAGNOSIS — N63 Unspecified lump in unspecified breast: Secondary | ICD-10-CM

## 2012-10-27 DIAGNOSIS — R079 Chest pain, unspecified: Secondary | ICD-10-CM

## 2012-10-27 DIAGNOSIS — H9319 Tinnitus, unspecified ear: Secondary | ICD-10-CM | POA: Insufficient documentation

## 2012-10-27 DIAGNOSIS — R0600 Dyspnea, unspecified: Secondary | ICD-10-CM

## 2012-10-27 DIAGNOSIS — G4733 Obstructive sleep apnea (adult) (pediatric): Secondary | ICD-10-CM | POA: Insufficient documentation

## 2012-10-27 LAB — POCT I-STAT, CHEM 8
BUN: 20 mg/dL (ref 6–23)
Calcium, Ion: 1.17 mmol/L (ref 1.13–1.30)
Chloride: 103 mEq/L (ref 96–112)
Creatinine, Ser: 0.8 mg/dL (ref 0.50–1.10)
Glucose, Bld: 176 mg/dL — ABNORMAL HIGH (ref 70–99)
HCT: 43 % (ref 36.0–46.0)
Hemoglobin: 14.6 g/dL (ref 12.0–15.0)
Potassium: 4.1 mEq/L (ref 3.5–5.1)
Sodium: 140 mEq/L (ref 135–145)
TCO2: 32 mmol/L (ref 0–100)

## 2012-10-27 LAB — CBC
HCT: 42.3 % (ref 36.0–46.0)
HCT: 38.7 % (ref 36.0–46.0)
Hemoglobin: 14 g/dL (ref 12.0–15.0)
Hemoglobin: 13 g/dL (ref 12.0–15.0)
MCH: 28.4 pg (ref 26.0–34.0)
MCH: 28.3 pg (ref 26.0–34.0)
MCHC: 33.1 g/dL (ref 30.0–36.0)
MCHC: 33.6 g/dL (ref 30.0–36.0)
MCV: 85.8 fL (ref 78.0–100.0)
MCV: 84.1 fL (ref 78.0–100.0)
Platelets: 302 10*3/uL (ref 150–400)
Platelets: 248 10*3/uL (ref 150–400)
RBC: 4.93 MIL/uL (ref 3.87–5.11)
RBC: 4.6 MIL/uL (ref 3.87–5.11)
RDW: 13.9 % (ref 11.5–15.5)
RDW: 14.3 % (ref 11.5–15.5)
WBC: 9.7 10*3/uL (ref 4.0–10.5)
WBC: 8.4 10*3/uL (ref 4.0–10.5)

## 2012-10-27 LAB — TROPONIN I
Troponin I: <0.3 ng/mL (ref <0.30)
Troponin I: <0.3 ng/mL (ref <0.30)
Troponin I: <0.3 ng/mL (ref <0.30)

## 2012-10-27 LAB — LIPID PANEL
Cholesterol: 140 mg/dL (ref 0–200)
HDL: 56 mg/dL (ref >39)
LDL Cholesterol: 54 mg/dL (ref 0–99)
Total CHOL/HDL Ratio: 2.5 RATIO
Triglycerides: 149 mg/dL (ref <150)
VLDL: 30 mg/dL (ref 0–40)

## 2012-10-27 LAB — CREATININE, SERUM
Creatinine, Ser: 0.75 mg/dL (ref 0.50–1.10)
GFR calc Af Amer: >90 mL/min (ref >90)
GFR calc non Af Amer: 89 mL/min — ABNORMAL LOW (ref >90)

## 2012-10-27 LAB — HEMOGLOBIN A1C
Hgb A1c MFr Bld: 7.3 % — ABNORMAL HIGH (ref <5.7)
Mean Plasma Glucose: 163 mg/dL — ABNORMAL HIGH (ref <117)

## 2012-10-27 LAB — PRO B NATRIURETIC PEPTIDE: Pro B Natriuretic peptide (BNP): 10 pg/mL (ref 0–125)

## 2012-10-27 LAB — TSH: TSH: 0.994 u[IU]/mL (ref 0.350–4.500)

## 2012-10-27 LAB — D-DIMER, QUANTITATIVE: D-Dimer, Quant: 0.72 ug/mL-FEU — ABNORMAL HIGH (ref 0.00–0.48)

## 2012-10-27 LAB — POCT I-STAT TROPONIN I: Troponin i, poc: 0.01 ng/mL (ref 0.00–0.08)

## 2012-10-27 MED ORDER — FENTANYL CITRATE 0.05 MG/ML IJ SOLN
25.0000 ug | Freq: Once | INTRAMUSCULAR | Status: AC
  Administered 2012-10-27: 25 ug via INTRAVENOUS
  Filled 2012-10-27: qty 2

## 2012-10-27 MED ORDER — MORPHINE SULFATE 2 MG/ML IJ SOLN
2.0000 mg | INTRAMUSCULAR | Status: DC | PRN
  Administered 2012-10-27 (×2): 2 mg via INTRAVENOUS
  Filled 2012-10-27 (×3): qty 1

## 2012-10-27 MED ORDER — VANCOMYCIN HCL IN DEXTROSE 1-5 GM/200ML-% IV SOLN
1000.0000 mg | Freq: Once | INTRAVENOUS | Status: AC
  Administered 2012-10-27: 1000 mg via INTRAVENOUS
  Filled 2012-10-27: qty 200

## 2012-10-27 MED ORDER — ACETAMINOPHEN 500 MG PO TABS
1000.0000 mg | ORAL_TABLET | Freq: Once | ORAL | Status: AC
  Administered 2012-10-27: 1000 mg via ORAL
  Filled 2012-10-27 (×2): qty 2

## 2012-10-27 MED ORDER — NITROGLYCERIN 0.4 MG SL SUBL: 0.4000 mg | SUBLINGUAL_TABLET | SUBLINGUAL | Status: DC | PRN

## 2012-10-27 MED ORDER — VANCOMYCIN HCL 10 G IV SOLR
1500.0000 mg | Freq: Once | INTRAVENOUS | Status: AC
  Administered 2012-10-27: 1500 mg via INTRAVENOUS
  Filled 2012-10-27: qty 1500

## 2012-10-27 MED ORDER — ENOXAPARIN SODIUM 40 MG/0.4ML ~~LOC~~ SOLN
40.0000 mg | SUBCUTANEOUS | Status: DC
  Administered 2012-10-27: 40 mg via SUBCUTANEOUS
  Filled 2012-10-27: qty 0.4

## 2012-10-27 MED ORDER — LOSARTAN POTASSIUM-HCTZ 100-12.5 MG PO TABS: 1.0000 | ORAL_TABLET | Freq: Every day | ORAL | Status: DC

## 2012-10-27 MED ORDER — SODIUM CHLORIDE 0.9 % IV SOLN: INTRAVENOUS | Status: DC

## 2012-10-27 MED ORDER — SODIUM CHLORIDE 0.9 % IJ SOLN
3.0000 mL | Freq: Two times a day (BID) | INTRAMUSCULAR | Status: DC
  Administered 2012-10-27 – 2012-10-28 (×3): 3 mL via INTRAVENOUS

## 2012-10-27 MED ORDER — ASPIRIN EC 325 MG PO TBEC
325.0000 mg | DELAYED_RELEASE_TABLET | Freq: Every day | ORAL | Status: DC
  Administered 2012-10-27: 325 mg via ORAL
  Filled 2012-10-27 (×3): qty 1

## 2012-10-27 MED ORDER — HYDROMORPHONE HCL PF 1 MG/ML IJ SOLN: 1.0000 mg | INTRAMUSCULAR | Status: DC | PRN

## 2012-10-27 MED ORDER — NITROGLYCERIN 0.4 MG SL SUBL
0.4000 mg | SUBLINGUAL_TABLET | Freq: Once | SUBLINGUAL | Status: AC
  Administered 2012-10-27: 0.4 mg via SUBLINGUAL
  Filled 2012-10-27: qty 25

## 2012-10-27 MED ORDER — SODIUM CHLORIDE 0.9 % IV SOLN
INTRAVENOUS | Status: DC
  Administered 2012-10-27: 03:00:00 via INTRAVENOUS

## 2012-10-27 MED ORDER — ONDANSETRON 4 MG PO TBDP
8.0000 mg | ORAL_TABLET | Freq: Once | ORAL | Status: AC
  Administered 2012-10-27: 8 mg via ORAL
  Filled 2012-10-27 (×2): qty 2

## 2012-10-27 MED ORDER — AMLODIPINE BESYLATE 5 MG PO TABS
5.0000 mg | ORAL_TABLET | Freq: Every day | ORAL | Status: DC
  Administered 2012-10-27 – 2012-10-29 (×3): 5 mg via ORAL
  Filled 2012-10-27 (×3): qty 1

## 2012-10-27 MED ORDER — ONDANSETRON HCL 4 MG/2ML IJ SOLN
4.0000 mg | Freq: Once | INTRAMUSCULAR | Status: AC
  Administered 2012-10-27: 4 mg via INTRAVENOUS
  Filled 2012-10-27: qty 2

## 2012-10-27 MED ORDER — HYDROCHLOROTHIAZIDE 12.5 MG PO CAPS
12.5000 mg | ORAL_CAPSULE | Freq: Every day | ORAL | Status: DC
  Administered 2012-10-27 – 2012-10-29 (×3): 12.5 mg via ORAL
  Filled 2012-10-27 (×3): qty 1

## 2012-10-27 MED ORDER — ONDANSETRON HCL 4 MG/2ML IJ SOLN
4.0000 mg | Freq: Three times a day (TID) | INTRAMUSCULAR | Status: DC | PRN
  Filled 2012-10-27: qty 2

## 2012-10-27 MED ORDER — CARVEDILOL 12.5 MG PO TABS
12.5000 mg | ORAL_TABLET | Freq: Two times a day (BID) | ORAL | Status: DC
  Administered 2012-10-27 – 2012-10-29 (×4): 12.5 mg via ORAL
  Filled 2012-10-27 (×6): qty 1

## 2012-10-27 MED ORDER — HYDROMORPHONE HCL PF 1 MG/ML IJ SOLN
INTRAMUSCULAR | Status: AC
  Filled 2012-10-27: qty 1

## 2012-10-27 MED ORDER — LOSARTAN POTASSIUM 50 MG PO TABS
100.0000 mg | ORAL_TABLET | Freq: Every day | ORAL | Status: DC
  Administered 2012-10-27 – 2012-10-29 (×3): 100 mg via ORAL
  Filled 2012-10-27 (×3): qty 2

## 2012-10-27 MED ORDER — SODIUM CHLORIDE 0.9 % IV SOLN
1250.0000 mg | Freq: Two times a day (BID) | INTRAVENOUS | Status: DC
  Administered 2012-10-28 (×2): 1250 mg via INTRAVENOUS
  Filled 2012-10-27 (×3): qty 1250

## 2012-10-27 MED ORDER — FUROSEMIDE 10 MG/ML IJ SOLN
40.0000 mg | Freq: Once | INTRAMUSCULAR | Status: AC
  Administered 2012-10-27: 17:00:00 via INTRAVENOUS
  Filled 2012-10-27: qty 4

## 2012-10-27 MED ORDER — ONDANSETRON HCL 4 MG/2ML IJ SOLN
INTRAMUSCULAR | Status: AC
  Filled 2012-10-27: qty 2

## 2012-10-27 MED ORDER — ENOXAPARIN SODIUM 150 MG/ML ~~LOC~~ SOLN
1.0000 mg/kg | Freq: Two times a day (BID) | SUBCUTANEOUS | Status: DC
  Filled 2012-10-27 (×2): qty 1

## 2012-10-27 MED ORDER — RIVAROXABAN 15 MG PO TABS
15.0000 mg | ORAL_TABLET | Freq: Two times a day (BID) | ORAL | Status: DC
  Administered 2012-10-27: 15 mg via ORAL
  Filled 2012-10-27 (×3): qty 1
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
## 2012-10-27 NOTE — Progress Notes (Signed)
*  Preliminary Results* Left upper extremity venous duplex completed. Left upper extremity is positive for deep vein thrombosis involving the left ulnar veins. Preliminary results discussed with Doree Fudge, RN.  10/27/2012 4:26 PM Gertie Fey, RDMS, RDCS

## 2012-10-27 NOTE — Progress Notes (Signed)
IV team couldn't start another IV on pt. For CT proposes.MD. Has been notified

## 2012-10-27 NOTE — ED Provider Notes (Signed)
History     CSN: [id number]  Arrival date & time 10/27/12  0010   First MD Initiated Contact with Patient 10/27/12 0017      Chief Complaint  Patient presents with  . Arm Pain    (Consider location/radiation/quality/duration/timing/severity/associated sxs/prior treatment) HPI Hx per PT - Left arm pain, hurts to move, feels swollen and red.  Has associated CP no SOB, no F/C, Has remote h/o skin infection and PT worried about this as well. No h/o DVT or PE.  Symptoms MOD to severe. No known alleviating factors Past Medical History  Diagnosis Date  . Hypertension   . Other abnormal glucose   . Obesity, unspecified   . Unspecified sleep apnea   . Syncope and collapse   . Chest pain   . Suicide attempt 1996  . Fatty liver 6/03  . Lung disease     Past Surgical History  Procedure Laterality Date  . Cardiac catheterization      2007  . Cholecystectomy    . Tubal ligation      Family History  Problem Relation Age of Onset  . Coronary artery disease Father 74  . Diabetes Father   . Heart disease Father   . Breast cancer Mother 83  . Cancer Mother     breast  . Coronary artery disease Sister 86  . Coronary artery disease Brother 63    History  Substance Use Topics  . Smoking status: Former Smoker    Quit date: 01/01/1992  . Smokeless tobacco: Not on file  . Alcohol Use: No    OB History   Grav Para Term Preterm Abortions TAB SAB Ect Mult Living                  Review of Systems  Constitutional: Negative for fever and chills.  HENT: Negative for neck pain and neck stiffness.   Eyes: Negative for pain.  Respiratory: Negative for shortness of breath.   Cardiovascular: Positive for chest pain.  Gastrointestinal: Negative for vomiting and abdominal pain.  Genitourinary: Negative for dysuria.  Musculoskeletal: Negative for back pain.  Skin: Negative for wound.  Neurological: Negative for headaches.  All other systems reviewed and are  negative.    Allergies  Amoxicillin; Aspirin; Meperidine hcl; Penicillins; and Percocet  Home Medications   Current Outpatient Rx  Name  Route  Sig  Dispense  Refill  . amLODipine (NORVASC) 5 MG tablet   Oral   Take 1 tablet (5 mg total) by mouth daily.   30 tablet   11   . carvedilol (COREG) 12.5 MG tablet   Oral   Take 1 tablet (12.5 mg total) by mouth 2 (two) times daily with a meal.   60 tablet   11   . losartan-hydrochlorothiazide (HYZAAR) 100-12.5 MG per tablet      TAKE 1 TABLET BY MOUTH DAILY.   30 tablet   2   . triamcinolone ointment (KENALOG) 0.1 %   Topical   Apply topically 2 (two) times daily.   30 g   0     BP 121/72  Pulse 81  Temp(Src) 98.1 F (36.7 C) (Oral)  Resp 16  SpO2 94%  Physical Exam  Constitutional: She is oriented to person, place, and time. She appears well-developed and well-nourished.  HENT:  Head: Normocephalic and atraumatic.  Eyes: EOM are normal. Pupils are equal, round, and reactive to light.  Neck: Neck supple.  Cardiovascular: Normal rate, regular rhythm and intact distal  pulses.   Pulmonary/Chest: Effort normal and breath sounds normal. No respiratory distress. She has no wheezes. She has no rales. She exhibits no tenderness.  Abdominal: Soft. Bowel sounds are normal. She exhibits no distension. There is no tenderness.  Musculoskeletal:  LUE: localizes pain mostly to bicep region  - is tender throughout with mild inc warmth to touch - no crepitus or erythema. Distal n/v intact, dec ROM secondary to pain.   Neurological: She is alert and oriented to person, place, and time.  Skin: Skin is warm and dry.    ED Course  Procedures (including critical care time)  Results for orders placed during the hospital encounter of 10/27/12  CBC      Result Value Range   WBC 9.7  4.0 - 10.5 K/uL   RBC 4.93  3.87 - 5.11 MIL/uL   Hemoglobin 14.0  12.0 - 15.0 g/dL   HCT 16.1  09.6 - 04.5 %   MCV 85.8  78.0 - 100.0 fL   MCH 28.4   26.0 - 34.0 pg   MCHC 33.1  30.0 - 36.0 g/dL   RDW 40.9  81.1 - 91.4 %   Platelets 302  150 - 400 K/uL  POCT I-STAT, CHEM 8      Result Value Range   Sodium 140  135 - 145 mEq/L   Potassium 4.1  3.5 - 5.1 mEq/L   Chloride 103  96 - 112 mEq/L   BUN 20  6 - 23 mg/dL   Creatinine, Ser 7.82  0.50 - 1.10 mg/dL   Glucose, Bld 956 (*) 70 - 99 mg/dL   Calcium, Ion 2.13  0.86 - 1.30 mmol/L   TCO2 32  0 - 100 mmol/L   Hemoglobin 14.6  12.0 - 15.0 g/dL   HCT 57.8  46.9 - 62.9 %  POCT I-STAT TROPONIN I      Result Value Range   Troponin i, poc 0.01  0.00 - 0.08 ng/mL   Comment 3            Dg Chest Portable 1 View  10/27/2012  *RADIOLOGY REPORT*  Clinical Data: Chest pain  PORTABLE CHEST - 1 VIEW  Comparison: 03/05/2009  Findings: Hypoaeration.  Interstitial and vascular crowding. Allowing for this, there is reticulonodular interstitial prominence.  Lung bases are obscured by the hemidiaphragms. Heart size mildly enlarged.  Mild aortic tortuosity.  No pneumothorax. Small effusions not excluded.  Multilevel degenerative change.  IMPRESSION: Interstitial prominence may reflect interstitial edema or atypical infection.  Consider PA and lateral radiographs when the patient can tolerate for better characterization.   Original Report Authenticated By: Jearld Lesch, M.D.    Dg Shoulder Left  10/27/2012  *RADIOLOGY REPORT*  Clinical Data: Chest and left shoulder pain for 24 hours, shortness of breath  LEFT SHOULDER - 2+ VIEW  Comparison: None  Findings: Examination limited by body habitus. Mild bony demineralization. AC joint alignment grossly normal. No definite acute fracture, dislocation, or bone destruction identified. Visualized left ribs appear grossly intact.  IMPRESSION: No definite acute left shoulder abnormalities.   Original Report Authenticated By: Ulyses Southward, M.D.        Date: 10/27/2012  Rate: 83  Rhythm: normal sinus rhythm  QRS Axis: left  Intervals: PR prolonged  ST/T Wave  abnormalities: nonspecific ST/T changes  Conduction Disutrbances:first-degree A-V block  and right bundle branch block  Narrative Interpretation: Sinus rhythm with new first degree AV block and new right bundle branch block versus previous EKG  dated 12/09/2011  Old EKG Reviewed: changes noted   Medicine consult. Discussed with family practice resident - plan transfer admission to [facility] for left arm pain, increased warmth and swelling.  MDM  Arm pain and swelling with associated CP  ECG, CXR, labs  IV Dilaudid, Vanc        Sunnie Nielsen, MD 10/27/12 [phone]

## 2012-10-27 NOTE — Progress Notes (Signed)
MD. Was notify of the vascular lab result.

## 2012-10-27 NOTE — H&P (Signed)
FMTS Attending Admission Note: Renold Don MD Personal pager:  [phone] FPTS Service Pager:  [phone]  I  have seen and examined this patient, reviewed their chart. I have discussed this patient with the resident. I agree with the resident's findings, assessment and care plan.  Please see my separate admit note for details.
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
## 2012-10-27 NOTE — Progress Notes (Signed)
ANTIBIOTIC CONSULT NOTE - INITIAL  Pharmacy Consult for Vancomycin Indication: LUE skin cellulitis  Allergies  Allergen Reactions  . Amoxicillin     REACTION: unspecified  . Aspirin     REACTION: unspecified  . Meperidine Hcl     REACTION: unspecified  . Penicillins   . Percocet (Oxycodone-Acetaminophen)     Patient Measurements: Height: 5\' 5"  (165.1 cm) Weight: 315 lb (142.883 kg) IBW/kg (Calculated) : 57  Vital Signs: Temp: 98.6 F (37 C) (03/12 0700) Temp src: Oral (03/12 0700) BP: 145/79 mmHg (03/12 0700) Pulse Rate: 89 (03/12 0700) Intake/Output from previous day:   Intake/Output from this shift:    Labs:  Recent Labs  10/27/12 0300 10/27/12 0323  WBC 9.7  --   HGB 14.0 14.6  PLT 302  --   CREATININE  --  0.80   Estimated Creatinine Clearance: 105.2 ml/min (by C-G formula based on Cr of 0.8). No results found for this basename: VANCOTROUGH, VANCOPEAK, VANCORANDOM, GENTTROUGH, GENTPEAK, GENTRANDOM, TOBRATROUGH, TOBRAPEAK, TOBRARND, AMIKACINPEAK, AMIKACINTROU, AMIKACIN,  in the last 72 hours   Microbiology: No results found for this or any previous visit (from the past 720 hour(s)).  Medical History: Past Medical History  Diagnosis Date  . Hypertension   . Other abnormal glucose   . Obesity, unspecified   . Unspecified sleep apnea   . Syncope and collapse   . Chest pain   . Suicide attempt 1996  . Fatty liver 6/03  . Lung disease     Assessment: 62 y.o. F who presented to the [facility] on 3/12 complaining of left arm pain which was found to be swollen and tender. Pharmacy was consulted to start Vancomycin for empiric cellulitis coverage. The patient received Vancomycin 1g in the [facility] around 0500 this a.m -- will complete load and start regular maintenance doses. SCr 0.8, CrCl~65-70 ml/min (normalized).   Goal of Therapy:  Vancomycin trough level 10-15 mcg/ml  Plan:  1. Vancomycin 1500 mg IV x 1 dose now (to make total LD of 2500 mg) 2. Start  Vancomycin 1250 mg IV every 12 hours this evening 3. Will continue to follow renal function, culture results, LOT, and antibiotic de-escalation plans   Georgina Pillion, PharmD, BCPS Clinical Pharmacist Pager: [phone] 10/27/2012 8:54 AM
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
## 2012-10-27 NOTE — Progress Notes (Signed)
Utilization review completed.  

## 2012-10-27 NOTE — Progress Notes (Signed)
  Echocardiogram 2D Echocardiogram has been performed.  [name], [name] 10/27/2012, 6:41 PM

## 2012-10-28 ENCOUNTER — Inpatient Hospital Stay (HOSPITAL_COMMUNITY): Payer: Medicare Other

## 2012-10-28 DIAGNOSIS — IMO0002 Reserved for concepts with insufficient information to code with codable children: Secondary | ICD-10-CM

## 2012-10-28 DIAGNOSIS — I82629 Acute embolism and thrombosis of deep veins of unspecified upper extremity: Secondary | ICD-10-CM

## 2012-10-28 DIAGNOSIS — R0609 Other forms of dyspnea: Secondary | ICD-10-CM

## 2012-10-28 DIAGNOSIS — R0989 Other specified symptoms and signs involving the circulatory and respiratory systems: Secondary | ICD-10-CM

## 2012-10-28 LAB — BASIC METABOLIC PANEL
BUN: 26 mg/dL — ABNORMAL HIGH (ref 6–23)
CO2: 33 mEq/L — ABNORMAL HIGH (ref 19–32)
Calcium: 9.2 mg/dL (ref 8.4–10.5)
Chloride: 100 mEq/L (ref 96–112)
Creatinine, Ser: 0.86 mg/dL (ref 0.50–1.10)
GFR calc Af Amer: 82 mL/min — ABNORMAL LOW (ref >90)
GFR calc non Af Amer: 71 mL/min — ABNORMAL LOW (ref >90)
Glucose, Bld: 195 mg/dL — ABNORMAL HIGH (ref 70–99)
Potassium: 3.8 mEq/L (ref 3.5–5.1)
Sodium: 141 mEq/L (ref 135–145)

## 2012-10-28 LAB — GLUCOSE, CAPILLARY
Glucose-Capillary: 121 mg/dL — ABNORMAL HIGH (ref 70–99)
Glucose-Capillary: 156 mg/dL — ABNORMAL HIGH (ref 70–99)

## 2012-10-28 LAB — CBC
HCT: 39.9 % (ref 36.0–46.0)
Hemoglobin: 13.3 g/dL (ref 12.0–15.0)
MCH: 28.9 pg (ref 26.0–34.0)
MCHC: 33.3 g/dL (ref 30.0–36.0)
MCV: 86.7 fL (ref 78.0–100.0)
Platelets: 257 10*3/uL (ref 150–400)
RBC: 4.6 MIL/uL (ref 3.87–5.11)
RDW: 14.4 % (ref 11.5–15.5)
WBC: 8.9 10*3/uL (ref 4.0–10.5)

## 2012-10-28 MED ORDER — ASPIRIN EC 81 MG PO TBEC: 81.0000 mg | DELAYED_RELEASE_TABLET | Freq: Every day | ORAL | Status: DC

## 2012-10-28 MED ORDER — ONDANSETRON 8 MG PO TBDP
8.0000 mg | ORAL_TABLET | Freq: Every day | ORAL | Status: DC
  Administered 2012-10-28 – 2012-10-29 (×2): 8 mg via ORAL
  Filled 2012-10-28 (×2): qty 1

## 2012-10-28 MED ORDER — RIVAROXABAN 15 MG PO TABS
15.0000 mg | ORAL_TABLET | Freq: Two times a day (BID) | ORAL | Status: DC
  Administered 2012-10-28 – 2012-10-29 (×3): 15 mg via ORAL
  Filled 2012-10-28 (×6): qty 1

## 2012-10-28 MED ORDER — ACETAMINOPHEN 325 MG PO TABS
650.0000 mg | ORAL_TABLET | Freq: Four times a day (QID) | ORAL | Status: DC | PRN
  Administered 2012-10-28 (×2): 650 mg via ORAL
  Filled 2012-10-28 (×2): qty 2

## 2012-10-28 MED ORDER — IOHEXOL 350 MG/ML SOLN
100.0000 mL | Freq: Once | INTRAVENOUS | Status: AC | PRN
  Administered 2012-10-28: 100 mL via INTRAVENOUS

## 2012-10-28 MED ORDER — INSULIN ASPART 100 UNIT/ML ~~LOC~~ SOLN
0.0000 [IU] | Freq: Three times a day (TID) | SUBCUTANEOUS | Status: DC
  Administered 2012-10-28 – 2012-10-29 (×2): 1 [IU] via SUBCUTANEOUS

## 2012-10-28 MED ORDER — LIVING WELL WITH DIABETES BOOK
Freq: Once | Status: AC
  Administered 2012-10-28: 14:00:00
  Filled 2012-10-28: qty 1

## 2012-10-28 MED ORDER — ONDANSETRON 8 MG PO TBDP
8.0000 mg | ORAL_TABLET | Freq: Once | ORAL | Status: DC
  Filled 2012-10-28: qty 1

## 2012-10-28 MED ORDER — ASPIRIN EC 81 MG PO TBEC
81.0000 mg | DELAYED_RELEASE_TABLET | Freq: Every day | ORAL | Status: DC
  Administered 2012-10-28 – 2012-10-29 (×2): 81 mg via ORAL
  Filled 2012-10-28 (×2): qty 1

## 2012-10-28 MED ORDER — MECLIZINE HCL 25 MG PO TABS
25.0000 mg | ORAL_TABLET | Freq: Three times a day (TID) | ORAL | Status: DC | PRN
  Administered 2012-10-28: 25 mg via ORAL
  Filled 2012-10-28: qty 1

## 2012-10-28 MED ORDER — ASPIRIN EC 81 MG PO TBEC
81.0000 mg | DELAYED_RELEASE_TABLET | Freq: Every day | ORAL | Status: DC
  Filled 2012-10-28: qty 1

## 2012-10-28 NOTE — Progress Notes (Signed)
   SUBJECTIVE:  No further arm pain.  No chest pain.     PHYSICAL EXAM Filed Vitals:   10/28/12 0439 10/28/12 0815 10/28/12 1058 10/28/12 1325  BP: 142/64 135/59 145/61 135/64  Pulse: 65 61  64  Temp: 98.1 F (36.7 C) 98.1 F (36.7 C)  98.4 F (36.9 C)  TempSrc: Oral Oral  Oral  Resp: 20 20  20   Height:      Weight:      SpO2: 95% 96%  97%   General:  No distress Lungs:  Clear Heart:  RRR Abdomen:  Positive bowel sounds, no rebound no guarding Extremities:  No edema  LABS: Lab Results  Component Value Date   CKTOTAL 136 11/25/2008   CKMB 2.3 11/25/2008   TROPONINI <0.30 10/27/2012   Results for orders placed during the hospital encounter of 10/27/12 (from the past 24 hour(s))  TROPONIN I     Status: None   Collection Time    10/27/12  8:54 PM      Result Value Range   Troponin I <0.30  <0.30 ng/mL  CBC     Status: None   Collection Time    10/28/12  4:50 AM      Result Value Range   WBC 8.9  4.0 - 10.5 K/uL   RBC 4.60  3.87 - 5.11 MIL/uL   Hemoglobin 13.3  12.0 - 15.0 g/dL   HCT 40.9  81.1 - 91.4 %   MCV 86.7  78.0 - 100.0 fL   MCH 28.9  26.0 - 34.0 pg   MCHC 33.3  30.0 - 36.0 g/dL   RDW 78.2  95.6 - 21.3 %   Platelets 257  150 - 400 K/uL  BASIC METABOLIC PANEL     Status: Abnormal   Collection Time    10/28/12  4:50 AM      Result Value Range   Sodium 141  135 - 145 mEq/L   Potassium 3.8  3.5 - 5.1 mEq/L   Chloride 100  96 - 112 mEq/L   CO2 33 (*) 19 - 32 mEq/L   Glucose, Bld 195 (*) 70 - 99 mg/dL   BUN 26 (*) 6 - 23 mg/dL   Creatinine, Ser 0.86  0.50 - 1.10 mg/dL   Calcium 9.2  8.4 - 57.8 mg/dL   GFR calc non Af Amer 71 (*) >90 mL/min   GFR calc Af Amer 82 (*) >90 mL/min    Intake/Output Summary (Last 24 hours) at 10/28/12 1755 Last data filed at 10/28/12 1400  Gross per 24 hour  Intake    120 ml  Output      1 ml  Net    119 ml    EKG:  NSR, rate 56, RBBB.  No acute ST T wave changes.   10/28/2012   ASSESSMENT AND PLAN:  Chest pain:   Atypical.  No objective evidence of ischemia.  Echo with poor windows but no evidence of LV or RV dysfunction.  No further work up.  Please call with further questions.  2  Rollene Rotunda 10/28/2012 5:55 PM

## 2012-10-28 NOTE — Evaluation (Signed)
Physical Therapy Evaluation Patient Details Name: [name] MRN: [id number] DOB: Apr 16, 1951 Today's Date: 10/28/2012 Time: 5621-3086 PT Time Calculation (min): 39 min  PT Assessment / Plan / Recommendation Clinical Impression  62 y/o female adm. for LUE DVT. Presents to PT with below impairments affecting her independence and quality of life. Will benefit physical therapy in the acute setting to maximize functional independence and activity tolerance for safe d/c home. Rec. HHPT with progression to OPPT for strengthening, conditioning and pain control. Patient educated on HEP to continue while here.     PT Assessment  Patient needs continued PT services    Follow Up Recommendations  Home health PT    Does the patient have the potential to tolerate intense rehabilitation      Barriers to Discharge        Equipment Recommendations   (rollator)    Recommendations for Other Services     Frequency Min 3X/week    Precautions / Restrictions Precautions Precautions: Fall Restrictions Weight Bearing Restrictions: No   Pertinent Vitals/Pain Chronic lower back pain, patient also with complaints of a headache and ear ache which is associated with vestibular type symptoms, reports this happens only when she gets an ear ache and that after she take ear drops it clears up      Mobility  Bed Mobility Bed Mobility: Rolling Right;Right Sidelying to Sit Rolling Right: 6: Modified independent (Device/Increase time) Right Sidelying to Sit: 6: Modified independent (Device/Increase time) Transfers Transfers: Sit to Stand;Stand to Sit;Stand Pivot Transfers Sit to Stand: 5: Supervision;With upper extremity assist Stand to Sit: 5: Supervision;With upper extremity assist Stand Pivot Transfers: 5: Supervision;With armrests (bed<>recliner) Details for Transfer Assistance: definitely needs upper extremity support to stand Ambulation/Gait Ambulation/Gait Assistance: 5: Supervision Ambulation  Distance (Feet): 40 Feet Assistive device:  (pushing IV pole) Ambulation/Gait Assistance Details: limited distance by back pain and increasing SOB, as she fatigues she tends to flex forward more; educated pt on possiblity of using rollator vs RW for back support as well as somewhere to sit when she gets fatigued and painful Gait Pattern: Step-through pattern;Trunk flexed;Step-to pattern    Exercises General Exercises - Lower Extremity Long Arc Quad: AROM;Both;10 reps;Seated Toe Raises: AROM;Both;10 reps;Seated Heel Raises: AROM;Both;10 reps;Seated   PT Diagnosis: Difficulty walking;Abnormality of gait;Generalized weakness;Acute pain  PT Problem List: Decreased strength;Decreased activity tolerance;Decreased balance;Decreased range of motion;Decreased mobility;Obesity;Pain;Cardiopulmonary status limiting activity;Decreased knowledge of use of DME PT Treatment Interventions: DME instruction   PT Goals Acute Rehab PT Goals PT Goal Formulation: With patient Time For Goal Achievement: 11/04/12 Potential to Achieve Goals: Good Pt will go Sit to Stand: with modified independence PT Goal: Sit to Stand - Progress: Goal set today Pt will go Stand to Sit: with modified independence PT Goal: Stand to Sit - Progress: Goal set today Pt will Transfer Bed to Chair/Chair to Bed: with modified independence PT Transfer Goal: Bed to Chair/Chair to Bed - Progress: Goal set today Pt will Ambulate: 51 - 150 feet;with least restrictive assistive device;with modified independence PT Goal: Ambulate - Progress: Goal set today Pt will Go Up / Down Stairs: 1-2 stairs;with modified independence;with rail(s) PT Goal: Up/Down Stairs - Progress: Goal set today Pt will Perform Home Exercise Program: Independently PT Goal: Perform Home Exercise Program - Progress: Goal set today  Visit Information  Last PT Received On: 10/28/12 Assistance Needed: +1    Subjective Data  Subjective: Im gonna call DSS on my  daughter. Patient Stated Goal: to walk without pain  and getting too short of breath   Prior Functioning  Home Living Lives With: Significant other;Family Available Help at Discharge: Family;Available PRN/intermittently Type of Home: House Home Access: Stairs to enter [employer] of Steps: 1 Entrance Stairs-Rails: Right;Left Home Layout: One level Bathroom Shower/Tub: Engineer, manufacturing systems chair with back Additional Comments: has difficulty stepping into her tub Prior Function Level of Independence: Needs assistance Needs Assistance: Meal Prep;Light Housekeeping Meal Prep: Maximal Light Housekeeping: Maximal Able to Take Stairs?:  (minimal stairs) Driving: Yes Vocation: On disability Comments: goes out in the community but cannot walk longer than 3 minutes before her back starts hurting or she gets out of breath; uses motorized carts at the grocery store Communication Communication: No difficulties    Cognition  Cognition Overall Cognitive Status: Appears within functional limits for tasks assessed/performed Arousal/Alertness: Awake/alert Orientation Level: Appears intact for tasks assessed Behavior During Session: [facility] for tasks performed    Extremity/Trunk Assessment Right Upper Extremity Assessment RUE ROM/Strength/Tone: [facility] for tasks assessed RUE Sensation: WFL - Light Touch Left Upper Extremity Assessment LUE ROM/Strength/Tone: Deficits LUE ROM/Strength/Tone Deficits: reports old rc injury, difficulty lifting above shoulder height, this is also the arm she has a DVT so I didn't fully test this LUE Sensation: WFL - Light Touch Right Lower Extremity Assessment RLE ROM/Strength/Tone: Deficits RLE ROM/Strength/Tone Deficits: generally weak, grossly 4/5; limited mostly by endurance Left Lower Extremity Assessment LLE ROM/Strength/Tone: Deficits LLE ROM/Strength/Tone Deficits: generally weak grossly 4/5; limited mostly by  endurance Trunk Assessment Trunk Assessment: Kyphotic;Other exceptions Trunk Exceptions: obese with heavy mid section    Balance    End of Session PT - End of Session Equipment Utilized During Treatment: Gait belt Activity Tolerance: Patient tolerated treatment well Patient left: in chair;with call bell/phone within reach Nurse Communication: Mobility status  GP     [facility] HELEN 10/28/2012, 4:04 PM

## 2012-10-28 NOTE — Progress Notes (Signed)
FMTS Attending Note  Patient seen and examined by me, discussed with resident team and I agree with the assessment and plan in Dr Patsey Berthold note for today.  Patient reports that her right arm is much improved with regard to swelling and discomfort.  She also reports marked improvement in her chest pressure today.  She has a new complaint of headache and left ear pain; reports prior diagnoses of "ear infections" and feels like she is beginning with this again today.  CT angio unable to be performed due to loss of iv access for dye infusion.  In reviewing cancer screening history and family history for cancer, patient reports that she is not up to date on breast or colon cancer screening. Mother with breast cancer history, maternal aunt also with breast cancer, maternal grandmother with ovarian cancer.  Patient anticoagulated on Xarelto.  Results of ECHO reviewed, not suggestive of R heart strain.  Paula Compton, MD

## 2012-10-28 NOTE — Progress Notes (Signed)
Family Medicine Teaching Service Daily Progress Note Service Page: [phone]  Subjective:  Feeling okay and is eager to go home. Does report pain associated with infiltration of iv (R shoulder)  Objective: Temp:  [97.6 F (36.4 C)-98.6 F (37 C)] 98.1 F (36.7 C) (03/13 0439) Pulse Rate:  [59-84] 65 (03/13 0439) Resp:  [20] 20 (03/13 0439) BP: (120-142)/(53-64) 142/64 mmHg (03/13 0439) SpO2:  [95 %-99 %] 95 % (03/13 0439) Weight:  [329 lb 12.9 oz (149.6 kg)] 329 lb 12.9 oz (149.6 kg) (03/12 2256)  Exam: General: sitting up in chair, NAD. Cardiovascular: RRR. No murmurs, rubs, or gallops. Respiratory: CTAB. No rales, rhonchi, or wheeze appreciated. Abdomen: obese, soft, nontender. Extremities: warm, well perfused. Trace LE edema.  Left antecubital fossa erythematous, likely cellulitis. LUE -  No edema appreciated, but difficult to assess due to morbid obesity.   CBC BMET   Recent Labs Lab 10/27/12 0300 10/27/12 0323 10/27/12 1552 10/28/12 0450  WBC 9.7  --  8.4 8.9  HGB 14.0 14.6 13.0 13.3  HCT 42.3 43.0 38.7 39.9  PLT 302  --  248 257    Recent Labs Lab 10/27/12 0323 10/27/12 1552 10/28/12 0450  NA 140  --  141  K 4.1  --  3.8  CL 103  --  100  CO2  --   --  33*  BUN 20  --  26*  CREATININE 0.80 0.75 0.86  GLUCOSE 176*  --  195*  CALCIUM  --   --  9.2     Cardiac Panel (last 3 results)  Recent Labs  10/27/12 0928 10/27/12 1552 10/27/12 2054  TROPONINI <0.30 <0.30 <0.30   BNP    Component Value Date/Time   PROBNP 10.0 10/27/2012 4540   Imaging/Diagnostic Tests:  Echo: Study Conclusions  - Left ventricle: The cavity size was normal. Wall thickness was increased in a pattern of mild LVH. Systolic function was normal. The estimated ejection fraction was in the range of 60% to 65%. Although no diagnostic regional wall motion abnormality was identified, this possibility cannot be completely excluded on the basis of this study. Doppler parameters  are consistent with abnormal left ventricular relaxation (grade 1 diastolic dysfunction). - Aortic valve: There was no stenosis. - Mitral valve: No significant regurgitation. - Right ventricle: The cavity size was normal. Systolic function was normal. - Pulmonary arteries: No complete TR doppler jet so unable to estimate PA systolic pressure. - Inferior vena cava: The vessel was normal in size; the respirophasic diameter changes were in the normal range (= 50%); findings are consistent with normal central venous pressure. Impressions: - Normal LV size with mild LV hypertrophy. EF 60-65%. Normal RV size and systolic function. No significant valvular abnormalities.  Dg Chest 2 View 10/27/2012 IMPRESSION: Low volume chest.  Diffuse interstitial prominence is present which could be due to the low lung volumes, interstitial pulmonary edema or atypical infection such as mycoplasma.    Dg Chest Portable 1 View 10/27/2012  IMPRESSION: Interstitial prominence may reflect interstitial edema or atypical infection.  Consider PA and lateral radiographs when the patient can tolerate for better characterization.   Dg Shoulder Left 10/27/2012  IMPRESSION: No definite acute left shoulder abnormalities.    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  # Chest pain, atypical - EKG revealed new RBBB concerning for ischemia. Pro BNP of 10. - Cardiology consulted and is now following. We greatly appreciate their help. - ACS ruled out - troponin negative x 3. - Likely OSA and HTN contributing to EKG abnormalities - Echo revealed Normal LV size with mild LV hypertrophy. EF 60-65%. Normal RV size and systolic function. No significant valvular abnormalities.  # L upper  extremity DVT and cellulitis - DVT likely secondary to immobility and morbid obesity.  Patient denies prior malignancy, but does report a significant Family history of cancer (mother had breast CA, and MGM had Ovarian cancer). - D dimer elevated.  CTA was not able to be obtained today as IV blew and infiltrated. Will continue Xarelto.   - Will continue empiric Vancomycin at this time for cellulitis as it has not significantly improved.  # HTN - Continue Coreg 12.5 BID, Amlodipine 5 mg, Losartan 100 mg daily, and HCTZ 12.5 mg daily  # Type 2 Diabetes, new diagnosis - A1C obtained and was elevated at 7.3. - CBG's TID AC and QHS. SSI during admission.  FEN/GI: KVO fluids. Heart Healthy diet. PPx: Xarelto Dispo: Pending clinical improvement. PT and OT ordered as patient has significant mobility issues secondary to pain and morbid obesity. Code Status: Full  Everlene Other, DO 10/28/2012, 8:19 AM

## 2012-10-28 NOTE — Progress Notes (Signed)
PGY-2 Progress Note:  Pt seen and examined with Dr. Mauricio Po. Overall, she states her arm is feeling better. She continues to complain of left ear pain, ringing in ears, mildly decreased hearing and dizziness. She states it causes her to have a headache. She has had symptoms like this in the past. Could be consistent with Meniere's Disease.   Plan: - Continue Tylenol for headache, add Meclizine for dizziness - D/c Vanc and skin does not appear consistent with cellulitis at this time. Monitor for worsening of pain or redness of antibiotics - Continue Xarelto. Patient informed how important it is to take medication - Order Case Management for HHPT   Anticipate d/c in next 24 hours if patient continues to do well. Patient updated at bedside on plan.  Amber M. Hairford, M.D. 10/28/2012 5:50 PM

## 2012-10-28 NOTE — Progress Notes (Signed)
This patient has received 64 ml's of IV omni350  contrast extravasation into rt shoulder during a cta chest exam.  The exam was performed on 10/28/2012  Site / affected area assessed by Dr.Blietz

## 2012-10-28 NOTE — Plan of Care (Signed)
Problem: Food- and Nutrition-Related Knowledge Deficit (NB-1.1) Goal: Nutrition education Formal process to instruct or train a patient/client in a skill or to impart knowledge to help patients/clients voluntarily manage or modify food choices and eating behavior to maintain or improve health. Outcome: Completed/Met Date Met:  10/28/12  RD consulted for nutrition education regarding diabetes.     Lab Results  Component Value Date    HGBA1C 7.3* 10/27/2012    Upon RD visitation, patient questioned "Oh, do I have diabetes?"  RN notified.  RD provided "Carbohydrate Counting for People with Diabetes" handout from the Academy of Nutrition and Dietetics. Discussed different food groups and their effects on blood sugar, emphasizing carbohydrate-containing foods. Provided list of carbohydrates and recommended serving sizes of common foods.  Discussed importance of controlled and consistent carbohydrate intake throughout the day. Provided examples of ways to balance meals/snacks and encouraged intake of high-fiber, whole grain complex carbohydrates. Teach back method used.  Expect fair compliance.  Body mass index is 54.88 kg/(m^2). Pt meets criteria for Obesity Class II based on current BMI.  Current diet order is Heart Healthy, patient is consuming approximately 100% of meals at this time. Labs and medications reviewed. No further nutrition interventions warranted at this time. If additional nutrition issues arise, please re-consult RD.  Maureen Chatters, RD, LDN Pager #: [phone] After-Hours Pager #: [phone]

## 2012-10-29 ENCOUNTER — Other Ambulatory Visit: Payer: Self-pay | Admitting: Family Medicine

## 2012-10-29 DIAGNOSIS — I82629 Acute embolism and thrombosis of deep veins of unspecified upper extremity: Secondary | ICD-10-CM

## 2012-10-29 DIAGNOSIS — N6320 Unspecified lump in the left breast, unspecified quadrant: Secondary | ICD-10-CM

## 2012-10-29 DIAGNOSIS — IMO0002 Reserved for concepts with insufficient information to code with codable children: Secondary | ICD-10-CM

## 2012-10-29 DIAGNOSIS — N63 Unspecified lump in unspecified breast: Secondary | ICD-10-CM

## 2012-10-29 DIAGNOSIS — R918 Other nonspecific abnormal finding of lung field: Secondary | ICD-10-CM

## 2012-10-29 LAB — GLUCOSE, CAPILLARY
Glucose-Capillary: 145 mg/dL — ABNORMAL HIGH (ref 70–99)
Glucose-Capillary: 116 mg/dL — ABNORMAL HIGH (ref 70–99)

## 2012-10-29 MED ORDER — MECLIZINE HCL 25 MG PO TABS: 25.0000 mg | ORAL_TABLET | Freq: Three times a day (TID) | ORAL | Status: DC | PRN

## 2012-10-29 MED ORDER — RIVAROXABAN 15 MG PO TABS: 15.0000 mg | ORAL_TABLET | Freq: Two times a day (BID) | ORAL | Status: DC

## 2012-10-29 MED ORDER — ASPIRIN 81 MG PO TBEC: 81.0000 mg | DELAYED_RELEASE_TABLET | Freq: Every day | ORAL | Status: DC

## 2012-10-29 MED ORDER — RIVAROXABAN 20 MG PO TABS: 20.0000 mg | ORAL_TABLET | Freq: Every day | ORAL | Status: DC

## 2012-10-29 MED ORDER — LORAZEPAM 0.5 MG PO TABS: 0.5000 mg | ORAL_TABLET | ORAL | Status: DC | PRN

## 2012-10-29 MED ORDER — ONDANSETRON 8 MG PO TBDP: 8.0000 mg | ORAL_TABLET | Freq: Every day | ORAL | Status: DC

## 2012-10-29 NOTE — Progress Notes (Signed)
Family Medicine Teaching Service Hospital Discharge Summary  Patient name: [name] Medical record number: [id number] Date of birth: 05/15/1951 Age: 62 y.o. 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  Patient also later reported substernal chest pain.  1) DVT of Upper Extremity - Left - Given acute swelling and pain and limited mobility secondary to morbid obesity, there was concern for DVT - US was obtained and confirmed DVT of the ulnar vein - Patient was started on Xarelto during admission.  2) Atypical chest pain - EKG was obtained and revealed new RBBB concerning for ischemia. - Given EKG findings, cardiology was consulted. - Troponin was negative x 3 and Echo revealed EF of 60-65% with LVH.  No wall motion abnormalities were appreciated. - Given negative troponin, echo findings, and atypical nature of chest pain no further work up was recommended by cardiology.  3) Breast nodule and multiple pulmonary nodules - CT findings suggestive of metastatic breast cancer - Given DVT and chest pain, there was concern for PE. - CTA was ordered.  Patient went for study and IV infiltrated preventing complete evaluation.  CT chest, however, was obtained. - CT chest revealed numerous noncalcified pulmonary nodules throughout both lungs  and 14 mm diameter nodular density left breast suggestive of metastatic disease. - Findings were discussed with patient and patient was setup [facility] for diagnostic mammogram, US, and likely biopsy.  4) Sleep apnea - Patient was placed on CPAP during admission.  5) HTN - Continued Coreg 12.5 BID, Amlodipine 5 mg, Losartan 100 mg daily, and HCTZ 12.5 mg daily.  Significant Labs and Imaging:   CBC BMET   Recent Labs Lab 10/27/12 0300 10/27/12 0323 10/27/12 1552 10/28/12 0450  WBC 9.7  --  8.4 8.9  HGB 14.0 14.6 13.0 13.3  HCT 42.3 43.0 38.7 39.9  PLT 302  --  248 257    Recent Labs Lab 10/27/12 0323 10/27/12 1552 10/28/12 0450  NA 140  --  141  K 4.1  --  3.8  CL 103  --  100  CO2  --   --  33*  BUN 20  --  26*  CREATININE 0.80 0.75 0.86  GLUCOSE 176*  --  195*  CALCIUM  --   --  9.2     Cardiac Panel (last 3 results)  Recent Labs  10/27/12 0928 10/27/12 1552 10/27/12 2054  TROPONINI <0.30 <0.30 <0.30   Ct Chest Wo Contrast  10/28/2012 IMPRESSION: Unable to perform contrast enhanced exam due to IV infiltration and lack of access. Minimal infiltration of fat adjacent to the left subclavian vein, nonspecific, can be seen with inflammatory processes, edema and DVT. Numerous noncalcified pulmonary nodules throughout both lungs, up to 11 mm in greatest size. Primary consideration is metastatic disease. 14   mm diameter nodular density left breast, recommend correlation with physical exam and mammography to exclude breast cancer.   Upper extremity Duplex (Left) - Summary: Findings consistent with acute vein thrombosis involving the left ulnar vein.  Echo:  Study Conclusions  - Left ventricle: The cavity size was normal. Wall thickness was increased in a pattern of mild LVH. Systolic function was normal. The estimated ejection fraction was in the range of 60% to 65%. Although no diagnostic regional wall motion abnormality was identified,  this possibility cannot be completely excluded on the basis of this study. Doppler parameters are consistent with abnormal left ventricular relaxation (grade 1 diastolic dysfunction). - Aortic valve: There was no stenosis. - Mitral valve: No significant regurgitation. - Right ventricle: The cavity size was normal. Systolic function was normal. - Pulmonary arteries: No complete TR doppler jet so unable to estimate PA systolic pressure. - Inferior vena cava: The vessel was normal in size; the respirophasic diameter changes were in the normal range (= 50%); findings are consistent with normal central venous pressure. Impressions: - Normal LV size with mild LV hypertrophy. EF 60-65%. Normal RV size and systolic function. No significant valvular abnormalities.  Dg Chest 2 View  10/27/2012 IMPRESSION: Low volume chest. Diffuse interstitial prominence is present which could be due to the low lung volumes, interstitial pulmonary edema or atypical infection such as mycoplasma.   Dg Chest Portable 1 View  10/27/2012 IMPRESSION: Interstitial prominence may reflect interstitial edema or atypical infection. Consider PA and lateral radiographs when the patient can tolerate for better characterization.   Dg Shoulder Left  10/27/2012 IMPRESSION: No definite acute left shoulder abnormalities.   Procedures: None  Consultations: Cardiology, Dr. Brackbill  Discharge Medications:    Medication List    TAKE these medications       amLODipine 5 MG tablet  Commonly known as:  NORVASC  Take 1 tablet (5 mg total) by mouth daily.     aspirin 81 MG EC tablet  Take 1 tablet (81 mg total) by mouth daily.     carvedilol 12.5 MG tablet  Commonly known as:  COREG  Take 1 tablet (12.5 mg total) by mouth 2 (two) times daily with a meal.     losartan-hydrochlorothiazide 100-12.5 MG per tablet  Commonly known as:  HYZAAR  TAKE 1 TABLET BY MOUTH DAILY.     meclizine 25 MG tablet  Commonly known as:  ANTIVERT   Take 1 tablet (25 mg total) by mouth 3 (three) times daily as needed for dizziness or nausea.     ondansetron 8 MG disintegrating tablet  Commonly known as:  ZOFRAN-ODT  Take 1 tablet (8 mg total) by mouth daily. Administration with aspirin given N/V associated with aspirin     Rivaroxaban 15 MG Tabs tablet  Commonly known as:  XARELTO  Take 1 tablet (15 mg total) by mouth 2 (two) times daily with a meal.  Start taking on:  11/09/2012     Rivaroxaban 20 MG Tabs  Commonly known as:  XARELTO  Take 1 tablet (20 mg total) by mouth daily. Patient needs to start 20 mg daily on 11/19/12.  Start taking on:  11/19/2012     triamcinolone ointment 0.1 %  Commonly known as:  KENALOG  Apply topically 2 (two) times daily.       Issues for Follow Up:  1) Patient diagnosed with diabetes during admission. Recommend starting oral agent for diabetes given A1C. 2) Follow up results of breast mammography, US, and   biopsy 3) Ensure compliance with Xarelto. Patient to start Xarelto 20 mg daily on April 4th. 4) Recommend follow up regarding ear pain and associated hearing loss (patient reported this on the day prior to discharge and was started on meclizine.) 5) Patient needs CPAP  Outstanding Results: None  Discharge Instructions: Patient was counseled important signs and symptoms that should prompt return to medical care, changes in medications, dietary instructions, activity restrictions, and follow up appointments.       Follow-up Information   Follow up with CHAMBLISS,MARSHALL L, MD On 11/03/2012. (830am)    Contact information:   [address] [place] [address] [phone]       [facility] with [facility] On 11/01/2012. (8:00 am)    Contact information:   [address], [place], [place] [address] [phone]      Discharge Condition: Stable. Discharged home.  Cook, Jayce, DO 10/29/2012, 1:51 PM 
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
## 2012-10-29 NOTE — Care Management Note (Signed)
CM has a card for a 10 day free trial of Xarelto if needed for this pt. This requires the MD to write a prescription for 10 days and a second prescription for ongoing needs. Please notify case manager if this is appropriate.  Johny Shock RN MPH Case Manager [phone]
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
## 2012-11-01 ENCOUNTER — Ambulatory Visit
Admit: 2012-11-01 | Discharge: 2012-11-01 | Disposition: A | Discharge status: Home / Self Care | Payer: Medicare Other | Attending: Family Medicine | Admitting: Family Medicine

## 2012-11-01 ENCOUNTER — Telehealth: Payer: Self-pay | Admitting: *Deleted

## 2012-11-01 ENCOUNTER — Other Ambulatory Visit: Payer: Self-pay | Admitting: Family Medicine

## 2012-11-01 DIAGNOSIS — N63 Unspecified lump in unspecified breast: Secondary | ICD-10-CM

## 2012-11-01 MED ORDER — ONDANSETRON 8 MG PO TBDP: 8.0000 mg | ORAL_TABLET | Freq: Every day | ORAL | Status: DC

## 2012-11-01 MED ORDER — ONDANSETRON HCL 4 MG PO TABS: 4.0000 mg | ORAL_TABLET | Freq: Three times a day (TID) | ORAL | Status: DC | PRN

## 2012-11-01 NOTE — Telephone Encounter (Signed)
Message left on voicemail  about this message regarding  medication.

## 2012-11-01 NOTE — Telephone Encounter (Signed)
Does not qualityf for the disintegrating tablets until has tried and failed regular zofran.  I sent in an eRx for that Please notify her  Thanks LC

## 2012-11-01 NOTE — Telephone Encounter (Signed)
PA required for ondansetron.  Form given to Dr. Deirdre Priest.

## 2012-11-03 ENCOUNTER — Encounter: Payer: Self-pay | Admitting: Family Medicine

## 2012-11-03 ENCOUNTER — Ambulatory Visit (INDEPENDENT_AMBULATORY_CARE_PROVIDER_SITE_OTHER): Payer: Medicare Other | Admitting: Family Medicine

## 2012-11-03 VITALS — BP 164/94 | HR 76 | Temp 97.9°F | Ht 65.0 in | Wt 330.0 lb

## 2012-11-03 DIAGNOSIS — N63 Unspecified lump in unspecified breast: Secondary | ICD-10-CM

## 2012-11-03 DIAGNOSIS — I82629 Acute embolism and thrombosis of deep veins of unspecified upper extremity: Secondary | ICD-10-CM

## 2012-11-03 DIAGNOSIS — R928 Other abnormal and inconclusive findings on diagnostic imaging of breast: Secondary | ICD-10-CM

## 2012-11-03 DIAGNOSIS — I82622 Acute embolism and thrombosis of deep veins of left upper extremity: Secondary | ICD-10-CM

## 2012-11-03 DIAGNOSIS — R7309 Other abnormal glucose: Secondary | ICD-10-CM

## 2012-11-03 DIAGNOSIS — R739 Hyperglycemia, unspecified: Secondary | ICD-10-CM

## 2012-11-03 MED ORDER — METFORMIN HCL 500 MG PO TABS: 500.0000 mg | ORAL_TABLET | Freq: Two times a day (BID) | ORAL | Status: DC

## 2012-11-03 NOTE — Progress Notes (Signed)
  Subjective:    Patient ID: [name], female    DOB: [birth year], 62 y.o.   MRN: [id number]  HPI  Upper Ext DVT She feels this is improving - less swelling and pain.  No shortness of breath or bleeding.  Taking xarelo twice daily  Breast and Lung Lesions Had [place] and visit at breast center.  Bx scheduled for Friday.  No pain or shortness of breath  Diabetes Diagnosed in hospital.  A1c 7.  Not on any medications.  No polyuria or dipsia.  Would like to lose weight but perhaps now not best time.  No feet problems  Review of Symptoms - see HPI  PMH - Smoking status noted.     Review of Systems     Objective:   Physical Exam Alert no acute distress left Arm - diffuse swelling compared to the right.  Palpable radial pulse.  No skin breakdown but chronic excematous changes both on left and right        Assessment & Plan:

## 2012-11-03 NOTE — Assessment & Plan Note (Signed)
Had [place] and bx for Friday.  Strong fhx of breast cancer.  She seems to be dealing with it well

## 2012-11-03 NOTE — Patient Instructions (Addendum)
I will check about if insurance will pay for your CPAP   Bring in ALL your Medication BOTTLES  Take your blood pressure medications at night NOT in the AM  I will let you know about your biopsy if I get the results  Take the new diabetes medicine Metformin 1/2 tablet once a day for 2 days then 1 tablet once a day for 2 days then one tablet twice daily   Come back in 1 month to check the diabetes and blood work

## 2012-11-03 NOTE — Assessment & Plan Note (Signed)
Seems to be improving by her account.  Continue anticoagulation.  Very likely due to possible metastatic breast cancer.  Await bx results

## 2012-11-03 NOTE — Assessment & Plan Note (Signed)
Likely diabetes.  Do not want to run risk of hypoglycemia but want to avoid very high blood sugars.  Will start low dose metformin and monitor

## 2012-11-05 ENCOUNTER — Ambulatory Visit
Admission: RE | Admit: 2012-11-05 | Discharge: 2012-11-05 | Disposition: A | Discharge status: Home / Self Care | Payer: Medicare Other | Source: Ambulatory Visit | Attending: Family Medicine | Admitting: Family Medicine

## 2012-11-05 ENCOUNTER — Other Ambulatory Visit: Payer: Self-pay | Admitting: Family Medicine

## 2012-11-05 DIAGNOSIS — N6320 Unspecified lump in the left breast, unspecified quadrant: Secondary | ICD-10-CM

## 2012-11-08 ENCOUNTER — Other Ambulatory Visit: Payer: Self-pay | Admitting: Family Medicine

## 2012-11-08 DIAGNOSIS — C50912 Malignant neoplasm of unspecified site of left female breast: Secondary | ICD-10-CM

## 2012-11-11 ENCOUNTER — Telehealth: Payer: Self-pay | Admitting: *Deleted

## 2012-11-11 DIAGNOSIS — C50212 Malignant neoplasm of upper-inner quadrant of left female breast: Secondary | ICD-10-CM

## 2012-11-11 DIAGNOSIS — C50219 Malignant neoplasm of upper-inner quadrant of unspecified female breast: Secondary | ICD-10-CM | POA: Insufficient documentation

## 2012-11-11 NOTE — Telephone Encounter (Signed)
Confirmed [facility] for 11/17/12 at 0800.  Instructions and contact information given.

## 2012-11-13 ENCOUNTER — Other Ambulatory Visit: Payer: Medicare Other

## 2012-11-17 ENCOUNTER — Ambulatory Visit (HOSPITAL_BASED_OUTPATIENT_CLINIC_OR_DEPARTMENT_OTHER): Payer: Medicare Other | Admitting: Surgery

## 2012-11-17 ENCOUNTER — Encounter: Payer: Self-pay | Admitting: *Deleted

## 2012-11-17 ENCOUNTER — Encounter (INDEPENDENT_AMBULATORY_CARE_PROVIDER_SITE_OTHER): Payer: Self-pay | Admitting: Surgery

## 2012-11-17 ENCOUNTER — Telehealth: Payer: Self-pay | Admitting: *Deleted

## 2012-11-17 ENCOUNTER — Ambulatory Visit
Admission: RE | Admit: 2012-11-17 | Discharge: 2012-11-17 | Disposition: A | Discharge status: Home / Self Care | Payer: Medicare Other | Source: Ambulatory Visit | Attending: Radiation Oncology | Admitting: Radiation Oncology

## 2012-11-17 ENCOUNTER — Ambulatory Visit (HOSPITAL_BASED_OUTPATIENT_CLINIC_OR_DEPARTMENT_OTHER): Payer: Medicare Other | Admitting: Oncology

## 2012-11-17 ENCOUNTER — Ambulatory Visit: Payer: Medicare Other

## 2012-11-17 ENCOUNTER — Ambulatory Visit (HOSPITAL_BASED_OUTPATIENT_CLINIC_OR_DEPARTMENT_OTHER): Payer: Medicare Other | Admitting: Lab

## 2012-11-17 ENCOUNTER — Encounter: Payer: Self-pay | Admitting: Oncology

## 2012-11-17 VITALS — BP 163/78 | HR 63 | Temp 97.8°F | Resp 20 | Ht 65.0 in | Wt 332.7 lb

## 2012-11-17 DIAGNOSIS — C50919 Malignant neoplasm of unspecified site of unspecified female breast: Secondary | ICD-10-CM

## 2012-11-17 DIAGNOSIS — C50212 Malignant neoplasm of upper-inner quadrant of left female breast: Secondary | ICD-10-CM

## 2012-11-17 DIAGNOSIS — C50219 Malignant neoplasm of upper-inner quadrant of unspecified female breast: Secondary | ICD-10-CM

## 2012-11-17 DIAGNOSIS — C50912 Malignant neoplasm of unspecified site of left female breast: Secondary | ICD-10-CM

## 2012-11-17 DIAGNOSIS — I82629 Acute embolism and thrombosis of deep veins of unspecified upper extremity: Secondary | ICD-10-CM

## 2012-11-17 DIAGNOSIS — Z171 Estrogen receptor negative status [ER-]: Secondary | ICD-10-CM

## 2012-11-17 DIAGNOSIS — R222 Localized swelling, mass and lump, trunk: Secondary | ICD-10-CM

## 2012-11-17 LAB — CBC WITH DIFFERENTIAL/PLATELET
BASO%: 0.6 % (ref 0.0–2.0)
Basophils Absolute: 0 10*3/uL (ref 0.0–0.1)
EOS%: 3.4 % (ref 0.0–7.0)
Eosinophils Absolute: 0.2 10*3/uL (ref 0.0–0.5)
HCT: 42.5 % (ref 34.8–46.6)
HGB: 14.2 g/dL (ref 11.6–15.9)
LYMPH%: 30.6 % (ref 14.0–49.7)
MCH: 28.4 pg (ref 25.1–34.0)
MCHC: 33.4 g/dL (ref 31.5–36.0)
MCV: 85 fL (ref 79.5–101.0)
MONO#: 0.4 10*3/uL (ref 0.1–0.9)
MONO%: 6.2 % (ref 0.0–14.0)
NEUT#: 4.2 10*3/uL (ref 1.5–6.5)
NEUT%: 59.2 % (ref 38.4–76.8)
Platelets: 279 10*3/uL (ref 145–400)
RBC: 5 10*6/uL (ref 3.70–5.45)
RDW: 14.3 % (ref 11.2–14.5)
WBC: 7.1 10*3/uL (ref 3.9–10.3)
lymph#: 2.2 10*3/uL (ref 0.9–3.3)
nRBC: 0 % (ref 0–0)

## 2012-11-17 LAB — COMPREHENSIVE METABOLIC PANEL (CC13)
ALT: 48 U/L (ref 0–55)
AST: 22 U/L (ref 5–34)
Albumin: 3.1 g/dL — ABNORMAL LOW (ref 3.5–5.0)
Alkaline Phosphatase: 117 U/L (ref 40–150)
BUN: 22.5 mg/dL (ref 7.0–26.0)
CO2: 28 mEq/L (ref 22–29)
Calcium: 9.3 mg/dL (ref 8.4–10.4)
Chloride: 103 mEq/L (ref 98–107)
Creatinine: 0.8 mg/dL (ref 0.6–1.1)
Glucose: 141 mg/dl — ABNORMAL HIGH (ref 70–99)
Potassium: 4.2 mEq/L (ref 3.5–5.1)
Sodium: 140 mEq/L (ref 136–145)
Total Bilirubin: 0.29 mg/dL (ref 0.20–1.20)
Total Protein: 7.1 g/dL (ref 6.4–8.3)

## 2012-11-17 NOTE — Progress Notes (Signed)
Patient ID: [name], female   DOB: [birth year], 62 y.o.   MRN: [id number]  Chief Complaint  Patient presents with  . Other    left breast cancer    HPI [name] is a 62 y.o. female.   HPI This is a pleasant female referred by Dr. Deirdre Priest.  She was found to have a left breast mass with possible metastatic disease after a CAT scan was performed following a DVT in the left arm. She has had no previous cough regarding her breast. She has an extensive past history and has sleep apnea. Past Medical History  Diagnosis Date  . Hypertension   . Other abnormal glucose   . Obesity, unspecified   . Unspecified sleep apnea   . Syncope and collapse   . Chest pain   . Suicide attempt 1996  . Fatty liver 6/03  . Lung disease   . Breast cancer   . Diabetes mellitus without complication   . Arthritis   . Back pain     Past Surgical History  Procedure Laterality Date  . Cardiac catheterization      2007  . Cholecystectomy    . Tubal ligation      Family History  Problem Relation Age of Onset  . Coronary artery disease Father 82  . Diabetes Father   . Heart disease Father   . Breast cancer Mother 71  . Cancer Mother     breast  . Coronary artery disease Sister 52  . Coronary artery disease Brother 8    Social History History  Substance Use Topics  . Smoking status: Former Smoker    Quit date: 01/01/1992  . Smokeless tobacco: Not on file  . Alcohol Use: No    Allergies  Allergen Reactions  . Amoxicillin     REACTION: unspecified  . Aspirin Nausea And Vomiting    REACTION: unspecified  . Meperidine Hcl     REACTION: unspecified  . Penicillins   . Percocet (Oxycodone-Acetaminophen)     Current Outpatient Prescriptions  Medication Sig Dispense Refill  . amLODipine (NORVASC) 5 MG tablet Take 1 tablet (5 mg total) by mouth daily.  30 tablet  11  . aspirin EC 81 MG EC tablet Take 1 tablet (81 mg total) by mouth daily.  30 tablet  3  . carvedilol (COREG) 12.5 MG  tablet Take 1 tablet (12.5 mg total) by mouth 2 (two) times daily with a meal.  60 tablet  11  . losartan-hydrochlorothiazide (HYZAAR) 100-12.5 MG per tablet TAKE 1 TABLET BY MOUTH DAILY.  30 tablet  2  . meclizine (ANTIVERT) 25 MG tablet Take 1 tablet (25 mg total) by mouth 3 (three) times daily as needed for dizziness or nausea.  90 tablet  1  . metFORMIN (GLUCOPHAGE) 500 MG tablet Take 1 tablet (500 mg total) by mouth 2 (two) times daily with a meal.  60 tablet  1  . ondansetron (ZOFRAN) 4 MG tablet Take 1 tablet (4 mg total) by mouth every 8 (eight) hours as needed for nausea.  20 tablet  0  . Rivaroxaban (XARELTO) 15 MG TABS tablet Take 1 tablet (15 mg total) by mouth 2 (two) times daily with a meal.  20 tablet  0  . [START ON 11/19/2012] Rivaroxaban (XARELTO) 20 MG TABS Take 1 tablet (20 mg total) by mouth daily. Patient needs to start 20 mg daily on 11/19/12.  30 tablet  3  . triamcinolone ointment (KENALOG) 0.1 % Apply  topically 2 (two) times daily.  30 g  0   No current facility-administered medications for this visit.    Review of Systems Review of Systems  Constitutional: Positive for fatigue. Negative for fever, chills and unexpected weight change.  HENT: Negative for hearing loss, congestion, sore throat, trouble swallowing and voice change.   Eyes: Negative for visual disturbance.  Respiratory: Positive for shortness of breath. Negative for cough and wheezing.   Cardiovascular: Positive for palpitations. Negative for chest pain and leg swelling.  Gastrointestinal: Negative for nausea, vomiting, abdominal pain, diarrhea, constipation, blood in stool, abdominal distention and anal bleeding.  Genitourinary: Negative for hematuria, vaginal bleeding and difficulty urinating.  Musculoskeletal: Negative for arthralgias.  Skin: Negative for rash and wound.  Neurological: Negative for seizures, syncope and headaches.  Hematological: Negative for adenopathy. Does not bruise/bleed easily.   Psychiatric/Behavioral: Negative for confusion.    There were no vitals taken for this visit.  Physical Exam Physical Exam  Constitutional: She is oriented to person, place, and time.  Morbidly obese female in no acute distress  HENT:  Head: Normocephalic and atraumatic.  Right Ear: External ear normal.  Left Ear: External ear normal.  Nose: Nose normal.  Mouth/Throat: Oropharynx is clear and moist.  Eyes: Conjunctivae are normal. Pupils are equal, round, and reactive to light.  Neck: Normal range of motion. Neck supple. No tracheal deviation present. No thyromegaly present.  Cardiovascular: Normal rate, regular rhythm, normal heart sounds and intact distal pulses.   No murmur heard. Pulmonary/Chest: Effort normal and breath sounds normal. No respiratory distress. She has no wheezes.  Neurological: She is alert and oriented to person, place, and time.  Psychiatric: Her behavior is normal. Judgment normal.    Data Reviewed I reviewed her x-ray data biopsy results  Assessment    Left breast cancer with possible metastatic disease to the lungs     Plan    She is being scheduled for biopsy of the lung lesions. I will be scheduling her for Port-A-Cath insertion for intravenous chemotherapy. She will need to stop her anticoagulation medication preoperatively. I will schedule this after lung biopsy. I discussed the risks of surgery which includes but is not limited to bleeding, infection, pneumothorax, etc. She is in understanding.        Kyrielle Urbanski A 11/17/2012, 10:35 AM

## 2012-11-17 NOTE — Progress Notes (Signed)
Checked in new pt with financial concerns.  Pt has ins but is living on disability raising an 63 year old child.  I gave her an EPP application and will process it when she returns it with the needed info.
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
## 2012-11-17 NOTE — Telephone Encounter (Signed)
appt made and printed 

## 2012-11-18 ENCOUNTER — Encounter: Payer: Self-pay | Admitting: *Deleted

## 2012-11-18 NOTE — Progress Notes (Signed)
[facility] Psychosocial Distress Screening Clinical Social Work  Patient completed distress screening protocol and scored a 3 on the Psychosocial Distress Thermometer which indicates mild distress. Clinical Child psychotherapist met with pt and pt's family in [facility] to assess for distress and other psychosocial needs.  Pt reported some anger and fear associated with her diagnosis, but stated she felt "better" after speaking with the healthcare team.  CSW and pt explored her anger and the importance of emotional support.  CSW informed pt of the support team and support services at [facility], but pt did not express much interest; stating "i am a private person and like to keep things to myself."  CSw and pt also discussed financial stressors and resources available.  Pt is also raising her 65 and 27 year old grandchildren.  CSW and pt discussed [facility], and pt plans to get her grandchildren involved.  CSW offered pt additional support and encouraged her to call with any questions or concerns.  Tamala Julian, MSW, LCSW Clinical Social Worker [facility] [phone]

## 2012-11-22 ENCOUNTER — Ambulatory Visit (HOSPITAL_BASED_OUTPATIENT_CLINIC_OR_DEPARTMENT_OTHER): Payer: Medicare Other | Admitting: Genetic Counselor

## 2012-11-22 ENCOUNTER — Other Ambulatory Visit: Payer: Medicare Other | Admitting: Lab

## 2012-11-22 DIAGNOSIS — C7800 Secondary malignant neoplasm of unspecified lung: Secondary | ICD-10-CM

## 2012-11-22 DIAGNOSIS — C50919 Malignant neoplasm of unspecified site of unspecified female breast: Secondary | ICD-10-CM

## 2012-11-22 DIAGNOSIS — Z809 Family history of malignant neoplasm, unspecified: Secondary | ICD-10-CM

## 2012-11-22 DIAGNOSIS — C50219 Malignant neoplasm of upper-inner quadrant of unspecified female breast: Secondary | ICD-10-CM

## 2012-11-22 DIAGNOSIS — IMO0002 Reserved for concepts with insufficient information to code with codable children: Secondary | ICD-10-CM

## 2012-11-22 NOTE — Progress Notes (Signed)
Dr. Darnelle Catalan requested a consultation for genetic counseling and risk assessment for [name], a 62 y.o. female, for discussion of her personal and family history of cancer. She presents to clinic today to discuss the possibility of a genetic predisposition to cancer, and to further clarify her risks, as well as her family members' risks for cancer.   HISTORY OF PRESENT ILLNESS: In 2014, at the age of 62, [name] was diagnosed with left breast cancer. Treatment decisions are pending per her report.     Past Medical History  Diagnosis Date   Hypertension    Other abnormal glucose    Obesity, unspecified    Unspecified sleep apnea    Syncope and collapse    Chest pain    Suicide attempt 1996   Fatty liver 6/03   Lung disease    Breast cancer    Diabetes mellitus without complication    Arthritis    Back pain     Past Surgical History  Procedure Laterality Date   Cardiac catheterization      2007   Cholecystectomy     Tubal ligation      History  Substance Use Topics   Smoking status: Former Smoker    Quit date: 01/01/1992   Smokeless tobacco: Not on file   Alcohol Use: No    REPRODUCTIVE HISTORY AND PERSONAL RISK ASSESSMENT FACTORS: Menarche was at age 60.   Postmenopausal Uterus Intact: no Ovaries Intact: yes [id number] , She not previously undergone treatment for infertility.        FAMILY HISTORY:  We obtained a detailed, 4-generation family history.  Significant diagnoses are listed below: Family History  Problem Relation Age of Onset   Coronary artery disease Father 51   Diabetes Father    Heart disease Father    Breast cancer Mother 77   Cancer Mother     breast   Coronary artery disease Sister 17   Coronary artery disease Brother 25    Patient's ancestors are of [place] descent. There is no reported Ashkenazi Jewish ancestry. There is no  known consanguinity.   GENETIC COUNSELING RISK ASSESSMENT,  DISCUSSION, AND SUGGESTED FOLLOW UP:  We reviewed the natural history and genetic etiology of sporadic, familial and hereditary cancer syndromes.   The patient's personal and family history of cancer is suggestive of the following possible diagnosis: Hereditary Breast and Ovarian Cancer syndrome.   We discussed that identification of a hereditary cancer syndrome may help her care providers tailor the patients medical management. If a mutation indicating the above condition is detected in this case, the [employer] recommendations would include following management recommendations for this condition. If a mutation is detected, the patient will be referred back to the referring provider and to any additional appropriate care providers to discuss the relevant options.   If a mutation is not found in the patient, cancer surveillance options would be discussed for the patient according to the appropriate standard National Comprehensive Cancer Network and American Cancer Society guidelines, with consideration of their personal and family history risk factors. In this case, the patient will be referred back to their care providers for discussions of management.   After considering the risks, benefits, and limitations, the patient provided informed consent for the following testing:  BRCA1 and BRCA2 gene testing with reflex testing to the Comprehensive Gene Panel through [facility]. We have asked the lab to rush her BRCA test so that we may  have a result for these two genes in order for her to make treatment decisions. If BRCA testing is negative, the reflex panel results will take approximately 8-12 weeks  and we will call her at that time.   Per the patient's request, we will contact her by telephone to discuss these results. A follow up genetic counseling visit will be scheduled if indicated.   The patient was seen for a total of 60 minutes, greater than 50% of which was spent  face-to-face counseling. This plan is being carried out per Dr. Milta Deiters recommendations. This note will also be sent to the referring provider via the electronic medical record. The patient will be supplied with a summary of this genetic counseling discussion as well as educational information on the discussed hereditary cancer syndromes following the conclusion of their visit.   Patient was discussed with Dr. Drue Second.   Patient was seen for 50 minutes.     EPIC CC: Dr. Darnelle Catalan     _______________________________________________________________________ For Office Staff:  Number of people involved in session: 3 Was an Intern/ student involved with case: yes }

## 2012-11-23 ENCOUNTER — Telehealth: Payer: Self-pay | Admitting: *Deleted

## 2012-11-23 ENCOUNTER — Encounter (HOSPITAL_COMMUNITY): Payer: Self-pay | Admitting: Pharmacy Technician

## 2012-11-23 ENCOUNTER — Other Ambulatory Visit: Payer: Self-pay | Admitting: *Deleted

## 2012-11-23 ENCOUNTER — Encounter: Payer: Self-pay | Admitting: *Deleted

## 2012-11-23 DIAGNOSIS — C50212 Malignant neoplasm of upper-inner quadrant of left female breast: Secondary | ICD-10-CM

## 2012-11-23 NOTE — Telephone Encounter (Signed)
Spoke to pt concerning [facility] on 11/17/12.  Pt denies questions or concerns regarding dx or treatment care plan.  D/T Dr. Magnus Ivan out of town we will have IR place port.  Pt made aware.  Confirmed PET/CT chest and f/u with Dr. Darnelle Catalan.  Informed pt that we will schedule lung bx based on results on PET scan on 4/9.  Received verbal understanding.  Pt denies further needs at this time.  Encourage pt to call with any needs, questions or concerns.  Contact information given.

## 2012-11-24 ENCOUNTER — Telehealth: Payer: Self-pay | Admitting: *Deleted

## 2012-11-24 ENCOUNTER — Other Ambulatory Visit: Payer: Self-pay | Admitting: Radiology

## 2012-11-24 ENCOUNTER — Encounter (HOSPITAL_COMMUNITY)
Admission: RE | Admit: 2012-11-24 | Discharge: 2012-11-24 | Disposition: A | Discharge status: Home / Self Care | Payer: Medicare Other | Source: Ambulatory Visit | Attending: Oncology | Admitting: Oncology

## 2012-11-24 ENCOUNTER — Encounter (HOSPITAL_COMMUNITY): Payer: Self-pay

## 2012-11-24 ENCOUNTER — Ambulatory Visit (HOSPITAL_COMMUNITY)
Admission: RE | Admit: 2012-11-24 | Discharge: 2012-11-24 | Disposition: A | Discharge status: Home / Self Care | Payer: Medicare Other | Source: Ambulatory Visit | Attending: Interventional Radiology | Admitting: Interventional Radiology

## 2012-11-24 DIAGNOSIS — C50919 Malignant neoplasm of unspecified site of unspecified female breast: Secondary | ICD-10-CM | POA: Insufficient documentation

## 2012-11-24 DIAGNOSIS — Q519 Congenital malformation of uterus and cervix, unspecified: Secondary | ICD-10-CM | POA: Insufficient documentation

## 2012-11-24 DIAGNOSIS — R634 Abnormal weight loss: Secondary | ICD-10-CM | POA: Insufficient documentation

## 2012-11-24 DIAGNOSIS — Z9089 Acquired absence of other organs: Secondary | ICD-10-CM | POA: Insufficient documentation

## 2012-11-24 DIAGNOSIS — C7800 Secondary malignant neoplasm of unspecified lung: Secondary | ICD-10-CM | POA: Insufficient documentation

## 2012-11-24 DIAGNOSIS — K5900 Constipation, unspecified: Secondary | ICD-10-CM | POA: Insufficient documentation

## 2012-11-24 DIAGNOSIS — C50212 Malignant neoplasm of upper-inner quadrant of left female breast: Secondary | ICD-10-CM

## 2012-11-24 DIAGNOSIS — K7689 Other specified diseases of liver: Secondary | ICD-10-CM | POA: Insufficient documentation

## 2012-11-24 DIAGNOSIS — N852 Hypertrophy of uterus: Secondary | ICD-10-CM | POA: Insufficient documentation

## 2012-11-24 DIAGNOSIS — R630 Anorexia: Secondary | ICD-10-CM | POA: Insufficient documentation

## 2012-11-24 DIAGNOSIS — D259 Leiomyoma of uterus, unspecified: Secondary | ICD-10-CM | POA: Insufficient documentation

## 2012-11-24 LAB — GLUCOSE, CAPILLARY: Glucose-Capillary: 130 mg/dL — ABNORMAL HIGH (ref 70–99)

## 2012-11-24 MED ORDER — FLUDEOXYGLUCOSE F - 18 (FDG) INJECTION
16.2000 | Freq: Once | INTRAVENOUS | Status: AC | PRN
  Administered 2012-11-24: 16.2 via INTRAVENOUS

## 2012-11-24 MED ORDER — IOHEXOL 300 MG/ML  SOLN
125.0000 mL | Freq: Once | INTRAMUSCULAR | Status: AC | PRN
  Administered 2012-11-24: 125 mL via INTRAVENOUS

## 2012-11-24 NOTE — Telephone Encounter (Signed)
Lm gv appt for a chemo edu class. Also made pt aware that i will mail her this appt as well.

## 2012-11-25 ENCOUNTER — Other Ambulatory Visit: Payer: Self-pay | Admitting: *Deleted

## 2012-11-26 ENCOUNTER — Ambulatory Visit (INDEPENDENT_AMBULATORY_CARE_PROVIDER_SITE_OTHER): Payer: Medicare Other | Admitting: Family Medicine

## 2012-11-26 ENCOUNTER — Encounter: Payer: Self-pay | Admitting: Family Medicine

## 2012-11-26 VITALS — BP 120/70 | HR 74 | Temp 98.6°F | Ht 65.0 in | Wt 328.0 lb

## 2012-11-26 DIAGNOSIS — J209 Acute bronchitis, unspecified: Secondary | ICD-10-CM | POA: Insufficient documentation

## 2012-11-26 MED ORDER — BENZONATATE 200 MG PO CAPS: 200.0000 mg | ORAL_CAPSULE | Freq: Three times a day (TID) | ORAL | Status: DC | PRN

## 2012-11-26 MED ORDER — AZITHROMYCIN 500 MG PO TABS: 500.0000 mg | ORAL_TABLET | Freq: Every day | ORAL | Status: DC

## 2012-11-26 MED ORDER — ALBUTEROL SULFATE HFA 108 (90 BASE) MCG/ACT IN AERS: 2.0000 | INHALATION_SPRAY | Freq: Four times a day (QID) | RESPIRATORY_TRACT | Status: DC | PRN

## 2012-11-26 NOTE — Assessment & Plan Note (Signed)
Wheezing, cough, SOB x 4 days likely secondary to acute bronchitis.  Azithromycin, Tessalon perles, and Albuterol inhaler sent to pharmacy.  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  She stopped taking Xarelto 5 days ago prior to upcoming biopsy.  Discussed patient's high risk for pulmonary embolism.  She had a PET scan 2 days ago that did not specifically comment on PE, but did appreciate pulmonary mets which could be contributing to SOB.  Breathing treatments done in clinic with minimal relief.  Pulse ox 93%.  Pulse 74.  Even though I believe she has bronchitis, I advised patient to go to ER to rule out PE.  Patient would like to defer at this time since she just had tests done 2 days ago.  I reviewed red flags with patient and she agreed to go ER if symptoms worsen.  Follow up with me in 1-2 weeks.

## 2012-11-26 NOTE — Patient Instructions (Addendum)
For bronchitis, take Azithromycin x 5 days and Tessalon perles as needed for cough and Albuterol inhaler as needed for wheezing or SOB. You are at higher risk for developing a clot in your lungs.   IF YOU DEVELOP WORSENING SHORTNESS OF BREATH, CHEST PAIN, INCREASE IN HEART RATE, PLEASE TO GO ER. Otherwise, schedule a follow up appointment with me in 1-2 weeks to make sure you are doing better.

## 2012-11-26 NOTE — Progress Notes (Signed)
  Subjective:    Patient ID: [name], female    DOB: 17-Aug-1951, 62 y.o.   MRN: [id number]  HPI  Patient presents to same day clinic for symptoms of URI.   Symptoms include cough (productive - green), SOB, wheezing worse with exertion, and sore throat. She does endorse CP after coughing spells, but denies assoicated any N/V or diaphoresis. Symptoms started 4 days, gradually improving.  Denies any fever or muscle aches. Former smoker - remote hx of asthma as a child.  Of note, she was recently diagnosed with breast cancer. Additionally, she was diagnosed with DVT of LT arm on 3/12.  She was on Xarelto until 5 days ago to prepare for upcoming biopsy.  Review of Systems Per HPI    Objective:   Physical Exam  Vitals reviewed. Constitutional: No distress.  Non-toxic appearing  Cardiovascular: Normal rate and regular rhythm.   No murmur heard. Pulmonary/Chest:  Appears to be working hard to breathe with conversation, but no use of accessory muscles; scattered wheezing worse RLQ; BS diminished diffusely due to body habitus      Assessment & Plan:

## 2012-11-29 ENCOUNTER — Other Ambulatory Visit: Payer: Self-pay | Admitting: Radiology

## 2012-11-29 ENCOUNTER — Encounter (HOSPITAL_COMMUNITY): Payer: Self-pay

## 2012-11-29 ENCOUNTER — Ambulatory Visit (HOSPITAL_COMMUNITY)
Admission: RE | Admit: 2012-11-29 | Discharge: 2012-11-29 | Disposition: A | Discharge status: Home / Self Care | Payer: Medicare Other | Source: Ambulatory Visit | Attending: Oncology | Admitting: Oncology

## 2012-11-29 ENCOUNTER — Other Ambulatory Visit: Payer: Self-pay | Admitting: Oncology

## 2012-11-29 DIAGNOSIS — I10 Essential (primary) hypertension: Secondary | ICD-10-CM | POA: Insufficient documentation

## 2012-11-29 DIAGNOSIS — E119 Type 2 diabetes mellitus without complications: Secondary | ICD-10-CM | POA: Insufficient documentation

## 2012-11-29 DIAGNOSIS — C50212 Malignant neoplasm of upper-inner quadrant of left female breast: Secondary | ICD-10-CM

## 2012-11-29 DIAGNOSIS — Z79899 Other long term (current) drug therapy: Secondary | ICD-10-CM | POA: Insufficient documentation

## 2012-11-29 DIAGNOSIS — C50919 Malignant neoplasm of unspecified site of unspecified female breast: Secondary | ICD-10-CM | POA: Insufficient documentation

## 2012-11-29 DIAGNOSIS — E669 Obesity, unspecified: Secondary | ICD-10-CM | POA: Insufficient documentation

## 2012-11-29 DIAGNOSIS — R918 Other nonspecific abnormal finding of lung field: Secondary | ICD-10-CM | POA: Insufficient documentation

## 2012-11-29 DIAGNOSIS — G4730 Sleep apnea, unspecified: Secondary | ICD-10-CM | POA: Insufficient documentation

## 2012-11-29 DIAGNOSIS — Z7901 Long term (current) use of anticoagulants: Secondary | ICD-10-CM | POA: Insufficient documentation

## 2012-11-29 LAB — BASIC METABOLIC PANEL
BUN: 15 mg/dL (ref 6–23)
CO2: 30 mEq/L (ref 19–32)
Calcium: 9.2 mg/dL (ref 8.4–10.5)
Chloride: 98 mEq/L (ref 96–112)
Creatinine, Ser: 0.75 mg/dL (ref 0.50–1.10)
GFR calc Af Amer: >90 mL/min (ref >90)
GFR calc non Af Amer: 89 mL/min — ABNORMAL LOW (ref >90)
Glucose, Bld: 169 mg/dL — ABNORMAL HIGH (ref 70–99)
Potassium: 3.5 mEq/L (ref 3.5–5.1)
Sodium: 139 mEq/L (ref 135–145)

## 2012-11-29 LAB — PROTIME-INR
INR: 0.98 (ref 0.00–1.49)
Prothrombin Time: 12.9 seconds (ref 11.6–15.2)

## 2012-11-29 LAB — CBC
HCT: 45 % (ref 36.0–46.0)
Hemoglobin: 15.1 g/dL — ABNORMAL HIGH (ref 12.0–15.0)
MCH: 28.4 pg (ref 26.0–34.0)
MCHC: 33.6 g/dL (ref 30.0–36.0)
MCV: 84.7 fL (ref 78.0–100.0)
Platelets: 299 10*3/uL (ref 150–400)
RBC: 5.31 MIL/uL — ABNORMAL HIGH (ref 3.87–5.11)
RDW: 13.9 % (ref 11.5–15.5)
WBC: 6.6 10*3/uL (ref 4.0–10.5)

## 2012-11-29 LAB — APTT: aPTT: 33 seconds (ref 24–37)

## 2012-11-29 MED ORDER — HEPARIN SOD (PORK) LOCK FLUSH 100 UNIT/ML IV SOLN
500.0000 [IU] | Freq: Once | INTRAVENOUS | Status: AC
  Administered 2012-11-29: 500 [IU] via INTRAVENOUS

## 2012-11-29 MED ORDER — MIDAZOLAM HCL 2 MG/2ML IJ SOLN
INTRAMUSCULAR | Status: AC
  Filled 2012-11-29: qty 6

## 2012-11-29 MED ORDER — LIDOCAINE HCL 1 % IJ SOLN
INTRAMUSCULAR | Status: AC
  Filled 2012-11-29: qty 20

## 2012-11-29 MED ORDER — SODIUM CHLORIDE 0.9 % IV SOLN
Freq: Once | INTRAVENOUS | Status: AC
  Administered 2012-11-29: 08:00:00 via INTRAVENOUS

## 2012-11-29 MED ORDER — FENTANYL CITRATE 0.05 MG/ML IJ SOLN
INTRAMUSCULAR | Status: AC | PRN
  Administered 2012-11-29: 100 ug via INTRAVENOUS

## 2012-11-29 MED ORDER — MIDAZOLAM HCL 2 MG/2ML IJ SOLN
INTRAMUSCULAR | Status: AC | PRN
  Administered 2012-11-29: 2 mg via INTRAVENOUS

## 2012-11-29 MED ORDER — FENTANYL CITRATE 0.05 MG/ML IJ SOLN
INTRAMUSCULAR | Status: AC
  Filled 2012-11-29: qty 6

## 2012-11-29 MED ORDER — VANCOMYCIN HCL 10 G IV SOLR
1500.0000 mg | Freq: Once | INTRAVENOUS | Status: AC
  Administered 2012-11-29: 1500 mg via INTRAVENOUS
  Filled 2012-11-29: qty 1500

## 2012-11-29 NOTE — H&P (Signed)
Chief Complaint: "I'm here for a port" Referring Physician:Magrinat HPI: [name] is an 62 y.o. female with newly diagnosed breast cancer. She is to begin chemotherapy soon and needs portacath. PMHx and meds reviewed. Pt has been on Xarelto for (L)UE DVT, but she has held it for the past several days. She is also scheduled for lung biopsy later this week. She otherwise feels well, no recent illness or fevers.  Past Medical History:  Past Medical History  Diagnosis Date  . Hypertension   . Other abnormal glucose   . Obesity, unspecified   . Unspecified sleep apnea   . Syncope and collapse   . Chest pain   . Suicide attempt 1996  . Fatty liver 6/03  . Lung disease   . Diabetes mellitus without complication   . Arthritis   . Back pain   . Breast cancer dx'd 11/2012    left    Past Surgical History:  Past Surgical History  Procedure Laterality Date  . Cardiac catheterization      2007  . Cholecystectomy    . Tubal ligation      Family History:  Family History  Problem Relation Age of Onset  . Coronary artery disease Father 84  . Diabetes Father   . Heart disease Father   . Breast cancer Mother 13  . Cancer Mother 47    breast  . Coronary artery disease Sister 10  . Coronary artery disease Brother 51  . Cancer Maternal Aunt 40    ovarian  . Cancer Maternal Grandmother 55    ovarian  . Cancer Paternal Aunt 33    ovarian/breast/breast    Social History:  reports that she quit smoking about 20 years ago. She does not have any smokeless tobacco history on file. She reports that she does not drink alcohol or use illicit drugs.  Allergies:  Allergies  Allergen Reactions  . Amoxicillin     REACTION: unspecified  . Aspirin Nausea And Vomiting    REACTION: unspecified  . Meperidine Hcl     REACTION: unspecified  . Penicillins   . Percocet (Oxycodone-Acetaminophen)     Medications: amLODipine (NORVASC) 5 MG tablet Sig - Route: Take 5 mg by mouth every  morning. - Oral Class: Historical Med Number of times this order has been changed since signing: 1 Order [address] aspirin EC 81 MG EC tablet 30 tablet 3 10/29/2012 Sig - Route: Take 1 tablet (81 mg total) by mouth daily. - Oral Number of times this order has been changed since signing: 1 Order [address] carvedilol (COREG) 12.5 MG tablet 60 tablet 11 01/01/2012 12/31/2012 Sig - Route: Take 1 tablet (12.5 mg total) by mouth 2 (two) times daily with a meal. - Oral Number of times this order has been changed since signing: 1 Order [address] losartan-hydrochlorothiazide (HYZAAR) 100-12.5 MG per tablet Sig - Route: Take 1 tablet by mouth every morning. - Oral Class: Historical Med Number of times this order has been changed since signing: 1 Order [address] metFORMIN (GLUCOPHAGE) 500 MG tablet 60 tablet 1 11/03/2012 Sig - Route: Take 1 tablet (500 mg total) by mouth 2 (two) times daily with a meal. - Oral Number of times this order has been changed since signing: 1 Order [address] ondansetron (ZOFRAN) 4 MG tablet 20 tablet 0 11/01/2012 Sig - Route: Take 1 tablet (4 mg total) by mouth every 8 (eight) hours as needed for nausea. - Oral Number of times this order  has been changed since signing: 1 Order [address] Rivaroxaban (XARELTO) 15 MG TABS tablet 20 tablet 0 11/09/2012 11/23/2012 Sig - Route: Take 1 tablet (15 mg total) by mouth 2 (two) times daily with a meal. - Oral Comment: Patient received free 10 days of Xarelto (via hospital). Patient to continue xarelto 15 mg BID until 4/3 at which time dose increases. Number of times this order has been changed since signing: 2 Order [address] Rivaroxaban (XARELTO) 20 MG TABS 30 tablet 3 11/19/2012 Sig - Route: Take 1 tablet (20 mg total) by mouth daily. Patient needs to start 20 mg daily on 11/19/12. - Oral Number of times this order has been changed since signing: 1 Order [address] triamcinolone ointment (KENALOG) 0.1 % Sig - Route: Apply 1 application topically 2 (two)  times daily as needed (for itching   Please HPI for pertinent positives, otherwise complete 10 system ROS negative.  Physical Exam: Blood pressure 154/66, pulse 74, temperature 98.4 F (36.9 C), temperature source Oral, resp. rate 18, height 5\' 3"  (1.6 m), weight 328 lb (148.78 kg), SpO2 96.00%. Body mass index is 58.12 kg/(m^2).   General Appearance:  Alert, cooperative, no distress, appears stated age  Head:  Normocephalic, without obvious abnormality, atraumatic  ENT: Unremarkable  Neck: Supple, symmetrical, trachea midline  Lungs:   Clear to auscultation bilaterally, no w/r/r, respirations unlabored without use of accessory muscles.  Chest Wall:  No tenderness or deformity  Heart:  Regular rate and rhythm, S1, S2 normal, no murmur, rub or gallop.   Neurologic: Normal affect, no gross deficits.   Results for orders placed during the hospital encounter of 11/29/12 (from the past 48 hour(s))  APTT     Status: None   Collection Time    11/29/12  8:00 AM      Result Value Range   aPTT 33  24 - 37 seconds  BASIC METABOLIC PANEL     Status: Abnormal   Collection Time    11/29/12  8:00 AM      Result Value Range   Sodium 139  135 - 145 mEq/L   Potassium 3.5  3.5 - 5.1 mEq/L   Chloride 98  96 - 112 mEq/L   CO2 30  19 - 32 mEq/L   Glucose, Bld 169 (*) 70 - 99 mg/dL   BUN 15  6 - 23 mg/dL   Creatinine, Ser 1.61  0.50 - 1.10 mg/dL   Calcium 9.2  8.4 - 09.6 mg/dL   GFR calc non Af Amer 89 (*) >90 mL/min   GFR calc Af Amer >90  >90 mL/min   Comment:            The eGFR has been calculated     using the CKD EPI equation.     This calculation has not been     validated in all clinical     situations.     eGFR's persistently     <90 mL/min signify     possible Chronic Kidney Disease.  CBC     Status: Abnormal   Collection Time    11/29/12  8:00 AM      Result Value Range   WBC 6.6  4.0 - 10.5 K/uL   RBC 5.31 (*) 3.87 - 5.11 MIL/uL   Hemoglobin 15.1 (*) 12.0 - 15.0 g/dL   HCT  04.5  40.9 - 81.1 %   MCV 84.7  78.0 - 100.0 fL   MCH 28.4  26.0 -  34.0 pg   MCHC 33.6  30.0 - 36.0 g/dL   RDW 78.2  95.6 - 21.3 %   Platelets 299  150 - 400 K/uL  PROTIME-INR     Status: None   Collection Time    11/29/12  8:00 AM      Result Value Range   Prothrombin Time 12.9  11.6 - 15.2 seconds   INR 0.98  0.00 - 1.49   No results found.  Assessment/Plan Breast cancer with possible met to lung. For port placement today, discussed procedure, risks, complications, use of sedation. Labs reviewed, ok. Consent signed in chart  Brayton El PA-C 11/29/2012, 8:42 AM

## 2012-11-29 NOTE — Procedures (Signed)
Interventional Radiology Procedure Note  Procedure: Placement of a right IJ approach single lumen PowerPort.  Tip is positioned at the superior cavoatrial junction and catheter is ready for immediate use.  Complications: No immediate Recommendations:  - Ok to shower tomorrow - Do not submerge for 7 days - Routine line care   Signed,  Sharif Rendell K. [place] Monds, MD Vascular & Interventional Radiologist [facility]  

## 2012-11-29 NOTE — H&P (Signed)
Agree with PA note.  Will proceed with placement of a RIJ approach portacatheter.   Signed,  Sterling Big, MD Vascular & Interventional Radiologist [facility]

## 2012-11-30 ENCOUNTER — Other Ambulatory Visit: Payer: Self-pay | Admitting: Radiology

## 2012-11-30 ENCOUNTER — Telehealth: Payer: Self-pay | Admitting: Oncology

## 2012-11-30 ENCOUNTER — Telehealth: Payer: Self-pay | Admitting: *Deleted

## 2012-11-30 ENCOUNTER — Other Ambulatory Visit: Payer: Medicare Other

## 2012-11-30 ENCOUNTER — Encounter: Payer: Self-pay | Admitting: *Deleted

## 2012-11-30 ENCOUNTER — Ambulatory Visit (HOSPITAL_BASED_OUTPATIENT_CLINIC_OR_DEPARTMENT_OTHER): Payer: Medicare Other | Admitting: Oncology

## 2012-11-30 VITALS — BP 177/73 | HR 87 | Temp 98.4°F | Resp 20 | Ht 63.0 in | Wt 330.3 lb

## 2012-11-30 DIAGNOSIS — R911 Solitary pulmonary nodule: Secondary | ICD-10-CM

## 2012-11-30 DIAGNOSIS — Z171 Estrogen receptor negative status [ER-]: Secondary | ICD-10-CM

## 2012-11-30 DIAGNOSIS — C50219 Malignant neoplasm of upper-inner quadrant of unspecified female breast: Secondary | ICD-10-CM

## 2012-11-30 DIAGNOSIS — C50212 Malignant neoplasm of upper-inner quadrant of left female breast: Secondary | ICD-10-CM

## 2012-11-30 DIAGNOSIS — I82629 Acute embolism and thrombosis of deep veins of unspecified upper extremity: Secondary | ICD-10-CM

## 2012-11-30 LAB — GLUCOSE, CAPILLARY: Glucose-Capillary: 155 mg/dL — ABNORMAL HIGH (ref 70–99)

## 2012-11-30 MED ORDER — DEXAMETHASONE 4 MG PO TABS: ORAL_TABLET | ORAL | Status: DC

## 2012-11-30 MED ORDER — ONDANSETRON HCL 8 MG PO TABS: ORAL_TABLET | ORAL | Status: DC

## 2012-11-30 MED ORDER — PROCHLORPERAZINE MALEATE 10 MG PO TABS: 10.0000 mg | ORAL_TABLET | Freq: Four times a day (QID) | ORAL | Status: DC | PRN

## 2012-11-30 NOTE — Telephone Encounter (Signed)
Per staff phone call and POF I have schedueld appts.  JMW  
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
## 2012-12-01 ENCOUNTER — Ambulatory Visit: Payer: Medicare Other | Admitting: Family Medicine

## 2012-12-01 ENCOUNTER — Encounter: Payer: Self-pay | Admitting: Oncology

## 2012-12-01 ENCOUNTER — Encounter (HOSPITAL_COMMUNITY): Payer: Self-pay

## 2012-12-01 ENCOUNTER — Ambulatory Visit (HOSPITAL_COMMUNITY)
Admission: RE | Admit: 2012-12-01 | Discharge: 2012-12-01 | Disposition: A | Discharge status: Home / Self Care | Payer: Medicare Other | Source: Ambulatory Visit | Attending: Oncology | Admitting: Oncology

## 2012-12-01 DIAGNOSIS — C50212 Malignant neoplasm of upper-inner quadrant of left female breast: Secondary | ICD-10-CM

## 2012-12-01 DIAGNOSIS — R918 Other nonspecific abnormal finding of lung field: Secondary | ICD-10-CM | POA: Insufficient documentation

## 2012-12-01 DIAGNOSIS — C50919 Malignant neoplasm of unspecified site of unspecified female breast: Secondary | ICD-10-CM | POA: Insufficient documentation

## 2012-12-01 DIAGNOSIS — C7800 Secondary malignant neoplasm of unspecified lung: Secondary | ICD-10-CM | POA: Insufficient documentation

## 2012-12-01 LAB — GLUCOSE, CAPILLARY: Glucose-Capillary: 135 mg/dL — ABNORMAL HIGH (ref 70–99)

## 2012-12-01 MED ORDER — ALBUTEROL SULFATE (5 MG/ML) 0.5% IN NEBU
2.5000 mg | INHALATION_SOLUTION | RESPIRATORY_TRACT | Status: DC | PRN
  Administered 2012-12-01: 2.5 mg via RESPIRATORY_TRACT
  Filled 2012-12-01: qty 0.5

## 2012-12-01 MED ORDER — HEPARIN SOD (PORK) LOCK FLUSH 100 UNIT/ML IV SOLN: 500.0000 [IU] | INTRAVENOUS | Status: DC | PRN

## 2012-12-01 MED ORDER — MIDAZOLAM HCL 2 MG/2ML IJ SOLN
INTRAMUSCULAR | Status: AC | PRN
  Administered 2012-12-01: 1 mg via INTRAVENOUS
  Administered 2012-12-01: 0.5 mg via INTRAVENOUS

## 2012-12-01 MED ORDER — SODIUM CHLORIDE 0.9 % IV SOLN
INTRAVENOUS | Status: DC
  Administered 2012-12-01: 12:00:00 via INTRAVENOUS

## 2012-12-01 MED ORDER — GUAIFENESIN-CODEINE 100-10 MG/5ML PO SOLN
10.0000 mL | ORAL | Status: DC | PRN
  Administered 2012-12-01: 10 mL via ORAL
  Filled 2012-12-01 (×2): qty 10

## 2012-12-01 MED ORDER — ONDANSETRON HCL 4 MG/2ML IJ SOLN
4.0000 mg | Freq: Four times a day (QID) | INTRAMUSCULAR | Status: DC | PRN
  Administered 2012-12-01: 4 mg via INTRAVENOUS
  Filled 2012-12-01 (×2): qty 2

## 2012-12-01 MED ORDER — HEPARIN SOD (PORK) LOCK FLUSH 100 UNIT/ML IV SOLN: 500.0000 [IU] | INTRAVENOUS | Status: DC

## 2012-12-01 MED ORDER — FENTANYL CITRATE 0.05 MG/ML IJ SOLN
INTRAMUSCULAR | Status: AC | PRN
  Administered 2012-12-01: 100 ug via INTRAVENOUS
  Administered 2012-12-01: 50 ug via INTRAVENOUS

## 2012-12-01 MED ORDER — MIDAZOLAM HCL 2 MG/2ML IJ SOLN
INTRAMUSCULAR | Status: AC
  Filled 2012-12-01: qty 6

## 2012-12-01 MED ORDER — HEPARIN SOD (PORK) LOCK FLUSH 100 UNIT/ML IV SOLN
500.0000 [IU] | INTRAVENOUS | Status: AC | PRN
  Administered 2012-12-01: 500 [IU]
  Filled 2012-12-01: qty 5

## 2012-12-01 MED ORDER — FENTANYL CITRATE 0.05 MG/ML IJ SOLN
INTRAMUSCULAR | Status: AC
  Filled 2012-12-01: qty 6

## 2012-12-01 NOTE — H&P (Signed)
For lung biopsy today.

## 2012-12-01 NOTE — H&P (Signed)
Chief Complaint: "I'm here for a lung biopsy" Referring Physician:Magrinat HPI: [name] is an 62 y.o. female with newly diagnosed breast cancer. She is to begin chemotherapy soon and had a port placed 2 days ago. She is doing well from that. PMHx and meds reviewed. Pt has been on Xarelto for (L)UE DVT, but she has held it for the past several days. She is also scheduled for lung biopsy today. She otherwise feels well, no recent illness or fevers.  Past Medical History:  Past Medical History  Diagnosis Date  . Hypertension   . Other abnormal glucose   . Obesity, unspecified   . Unspecified sleep apnea   . Syncope and collapse   . Chest pain   . Suicide attempt 1996  . Fatty liver 6/03  . Lung disease   . Diabetes mellitus without complication   . Arthritis   . Back pain   . Breast cancer dx'd 11/2012    left    Past Surgical History:  Past Surgical History  Procedure Laterality Date  . Cardiac catheterization      2007  . Cholecystectomy    . Tubal ligation      Family History:  Family History  Problem Relation Age of Onset  . Coronary artery disease Father 52  . Diabetes Father   . Heart disease Father   . Breast cancer Mother 9  . Cancer Mother 71    breast  . Coronary artery disease Sister 67  . Coronary artery disease Brother 27  . Cancer Maternal Aunt 40    ovarian  . Cancer Maternal Grandmother 55    ovarian  . Cancer Paternal Aunt 62    ovarian/breast/breast    Social History:  reports that she quit smoking about 20 years ago. She does not have any smokeless tobacco history on file. She reports that she does not drink alcohol or use illicit drugs.  Allergies:  Allergies  Allergen Reactions  . Amoxicillin     REACTION: unspecified  . Aspirin Nausea And Vomiting    REACTION: unspecified  . Meperidine Hcl     REACTION: unspecified  . Penicillins   . Percocet (Oxycodone-Acetaminophen)     Medications: amLODipine (NORVASC) 5 MG tablet Sig  - Route: Take 5 mg by mouth every morning. - Oral Class: Historical Med Number of times this order has been changed since signing: 1 Order [address] aspirin EC 81 MG EC tablet 30 tablet 3 10/29/2012 Sig - Route: Take 1 tablet (81 mg total) by mouth daily. - Oral Number of times this order has been changed since signing: 1 Order [address] carvedilol (COREG) 12.5 MG tablet 60 tablet 11 01/01/2012 12/31/2012 Sig - Route: Take 1 tablet (12.5 mg total) by mouth 2 (two) times daily with a meal. - Oral Number of times this order has been changed since signing: 1 Order [address] losartan-hydrochlorothiazide (HYZAAR) 100-12.5 MG per tablet Sig - Route: Take 1 tablet by mouth every morning. - Oral Class: Historical Med Number of times this order has been changed since signing: 1 Order [address] metFORMIN (GLUCOPHAGE) 500 MG tablet 60 tablet 1 11/03/2012 Sig - Route: Take 1 tablet (500 mg total) by mouth 2 (two) times daily with a meal. - Oral Number of times this order has been changed since signing: 1 Order [address] ondansetron (ZOFRAN) 4 MG tablet 20 tablet 0 11/01/2012 Sig - Route: Take 1 tablet (4 mg total) by mouth every 8 (eight) hours as  needed for nausea. - Oral Number of times this order has been changed since signing: 1 Order [address] Rivaroxaban (XARELTO) 15 MG TABS tablet 20 tablet 0 11/09/2012 11/23/2012 Sig - Route: Take 1 tablet (15 mg total) by mouth 2 (two) times daily with a meal. - Oral Comment: Patient received free 10 days of Xarelto (via hospital). Patient to continue xarelto 15 mg BID until 4/3 at which time dose increases. Number of times this order has been changed since signing: 2 Order [address] Rivaroxaban (XARELTO) 20 MG TABS 30 tablet 3 11/19/2012 Sig - Route: Take 1 tablet (20 mg total) by mouth daily. Patient needs to start 20 mg daily on 11/19/12. - Oral Number of times this order has been changed since signing: 1 Order [address] triamcinolone ointment (KENALOG) 0.1 % Sig - Route: Apply  1 application topically 2 (two) times daily as needed (for itching   Please HPI for pertinent positives, otherwise complete 10 system ROS negative.  Physical Exam: Temp: 98.1, HR: 66, RR: 18, BP: 157/79, O2: 96%   General Appearance:  Alert, cooperative, no distress, appears stated age  Head:  Normocephalic, without obvious abnormality, atraumatic  ENT: Unremarkable  Neck: Supple, symmetrical, trachea midline  Lungs:   Clear to auscultation bilaterally, no w/r/r, respirations unlabored without use of accessory muscles.  Chest Wall:  Rt port site clean, no hematoma. Mildly tender.  Heart:  Regular rate and rhythm, S1, S2 normal, no murmur, rub or gallop.   Neurologic: Normal affect, no gross deficits.   CBC    Component Value Date/Time   WBC 6.6 11/29/2012 0800   WBC 7.1 11/17/2012 0819   RBC 5.31* 11/29/2012 0800   RBC 5.00 11/17/2012 0819   HGB 15.1* 11/29/2012 0800   HGB 14.2 11/17/2012 0819   HCT 45.0 11/29/2012 0800   HCT 42.5 11/17/2012 0819   PLT 299 11/29/2012 0800    Prothrombin Time/INR 12.9/0.98  Assessment/Plan Breast cancer with possible met to lung. For CT guided lung lesion biopsy today.  Discussed procedure, risks, complications, use of sedation. Labs reviewed, ok. Consent signed in chart  Brayton El PA-C 12/01/2012, 12:04 PM

## 2012-12-01 NOTE — Procedures (Signed)
Procedure:  CT guided core biopsy of lung 10 mm LLL lung nodule sampled via 17 G needle with 18 G cores x 3.   Minimal hemorrhage.  No PTX.

## 2012-12-02 ENCOUNTER — Encounter: Payer: Self-pay | Admitting: Family Medicine

## 2012-12-02 ENCOUNTER — Other Ambulatory Visit: Payer: Self-pay | Admitting: Oncology

## 2012-12-02 ENCOUNTER — Other Ambulatory Visit: Payer: Self-pay | Admitting: *Deleted

## 2012-12-02 ENCOUNTER — Other Ambulatory Visit (HOSPITAL_COMMUNITY): Payer: Medicare Other

## 2012-12-02 ENCOUNTER — Other Ambulatory Visit: Payer: Self-pay | Admitting: Family Medicine

## 2012-12-02 MED ORDER — LIDOCAINE-PRILOCAINE 2.5-2.5 % EX CREA: TOPICAL_CREAM | CUTANEOUS | Status: DC | PRN

## 2012-12-03 ENCOUNTER — Telehealth: Payer: Self-pay | Admitting: *Deleted

## 2012-12-03 NOTE — Telephone Encounter (Signed)
Discussed application of EMLA cream and when to place.  Received verbal understanding.  Pt denies further needs at this time.

## 2012-12-06 ENCOUNTER — Other Ambulatory Visit: Payer: Self-pay | Admitting: Oncology

## 2012-12-06 ENCOUNTER — Other Ambulatory Visit (HOSPITAL_BASED_OUTPATIENT_CLINIC_OR_DEPARTMENT_OTHER): Payer: Medicare Other | Admitting: Lab

## 2012-12-06 ENCOUNTER — Encounter: Payer: Self-pay | Admitting: Oncology

## 2012-12-06 ENCOUNTER — Other Ambulatory Visit: Payer: Self-pay | Admitting: *Deleted

## 2012-12-06 ENCOUNTER — Ambulatory Visit (HOSPITAL_BASED_OUTPATIENT_CLINIC_OR_DEPARTMENT_OTHER): Payer: Medicare Other

## 2012-12-06 VITALS — BP 110/65 | HR 92 | Temp 98.6°F | Resp 20

## 2012-12-06 DIAGNOSIS — C50219 Malignant neoplasm of upper-inner quadrant of unspecified female breast: Secondary | ICD-10-CM

## 2012-12-06 DIAGNOSIS — C50212 Malignant neoplasm of upper-inner quadrant of left female breast: Secondary | ICD-10-CM

## 2012-12-06 DIAGNOSIS — Z5111 Encounter for antineoplastic chemotherapy: Secondary | ICD-10-CM

## 2012-12-06 DIAGNOSIS — Z5112 Encounter for antineoplastic immunotherapy: Secondary | ICD-10-CM

## 2012-12-06 LAB — CBC WITH DIFFERENTIAL/PLATELET
BASO%: 0.4 % (ref 0.0–2.0)
Basophils Absolute: 0 10*3/uL (ref 0.0–0.1)
EOS%: 2.6 % (ref 0.0–7.0)
Eosinophils Absolute: 0.1 10*3/uL (ref 0.0–0.5)
HCT: 44 % (ref 34.8–46.6)
HGB: 14.5 g/dL (ref 11.6–15.9)
LYMPH%: 25.5 % (ref 14.0–49.7)
MCH: 28.2 pg (ref 25.1–34.0)
MCHC: 33 g/dL (ref 31.5–36.0)
MCV: 85.6 fL (ref 79.5–101.0)
MONO#: 0.4 10*3/uL (ref 0.1–0.9)
MONO%: 6.8 % (ref 0.0–14.0)
NEUT#: 3.4 10*3/uL (ref 1.5–6.5)
NEUT%: 64.7 % (ref 38.4–76.8)
Platelets: 272 10*3/uL (ref 145–400)
RBC: 5.14 10*6/uL (ref 3.70–5.45)
RDW: 14.2 % (ref 11.2–14.5)
WBC: 5.3 10*3/uL (ref 3.9–10.3)
lymph#: 1.4 10*3/uL (ref 0.9–3.3)

## 2012-12-06 MED ORDER — SODIUM CHLORIDE 0.9 % IV SOLN
Freq: Once | INTRAVENOUS | Status: AC
  Administered 2012-12-06: 11:00:00 via INTRAVENOUS

## 2012-12-06 MED ORDER — DIPHENHYDRAMINE HCL 25 MG PO CAPS
25.0000 mg | ORAL_CAPSULE | Freq: Once | ORAL | Status: AC
  Administered 2012-12-06: 25 mg via ORAL

## 2012-12-06 MED ORDER — DEXAMETHASONE SODIUM PHOSPHATE 10 MG/ML IJ SOLN
10.0000 mg | Freq: Once | INTRAMUSCULAR | Status: AC
  Administered 2012-12-06: 10 mg via INTRAVENOUS

## 2012-12-06 MED ORDER — SODIUM CHLORIDE 0.9 % IV SOLN
8.0000 mg/kg | Freq: Once | INTRAVENOUS | Status: AC
  Administered 2012-12-06: 1197 mg via INTRAVENOUS
  Filled 2012-12-06: qty 57

## 2012-12-06 MED ORDER — ACETAMINOPHEN 325 MG PO TABS
650.0000 mg | ORAL_TABLET | Freq: Once | ORAL | Status: AC
  Administered 2012-12-06: 650 mg via ORAL

## 2012-12-06 MED ORDER — ONDANSETRON 8 MG/50ML IVPB (CHCC)
8.0000 mg | Freq: Once | INTRAVENOUS | Status: AC
  Administered 2012-12-06: 8 mg via INTRAVENOUS

## 2012-12-06 MED ORDER — SODIUM CHLORIDE 0.9 % IJ SOLN
10.0000 mL | INTRAMUSCULAR | Status: DC | PRN
  Administered 2012-12-06: 10 mL
  Filled 2012-12-06: qty 10

## 2012-12-06 MED ORDER — DOCETAXEL CHEMO INJECTION 160 MG/16ML
75.0000 mg/m2 | Freq: Once | INTRAVENOUS | Status: AC
  Administered 2012-12-06: 190 mg via INTRAVENOUS
  Filled 2012-12-06: qty 19

## 2012-12-06 MED ORDER — HEPARIN SOD (PORK) LOCK FLUSH 100 UNIT/ML IV SOLN
500.0000 [IU] | Freq: Once | INTRAVENOUS | Status: AC | PRN
  Administered 2012-12-06: 500 [IU]
  Filled 2012-12-06: qty 5

## 2012-12-06 MED ORDER — SODIUM CHLORIDE 0.9 % IV SOLN
840.0000 mg | Freq: Once | INTRAVENOUS | Status: AC
  Administered 2012-12-06: 840 mg via INTRAVENOUS
  Filled 2012-12-06: qty 28

## 2012-12-06 NOTE — Progress Notes (Signed)
100%ind 12/06/12-06/07/13. I will send the card and letter to the patient.

## 2012-12-06 NOTE — Patient Instructions (Signed)
[facility] Discharge Instructions for Patients Receiving Chemotherapy  Today you received the following chemotherapy agents Herceptin, Perjeta, Taxotere  To help prevent nausea and vomiting after your treatment, we encourage you to take your nausea medication Zofran Begin taking it at 9:00pm and take it as often as prescribed for the next 24-36 hours.   If you develop nausea and vomiting that is not controlled by your nausea medication, call the clinic. If it is after clinic hours your family physician or the after hours number for the clinic or go to the Emergency Department.   BELOW ARE SYMPTOMS THAT SHOULD BE REPORTED IMMEDIATELY:  *FEVER GREATER THAN 100.5 F  *CHILLS WITH OR WITHOUT FEVER  NAUSEA AND VOMITING THAT IS NOT CONTROLLED WITH YOUR NAUSEA MEDICATION  *UNUSUAL SHORTNESS OF BREATH  *UNUSUAL BRUISING OR BLEEDING  TENDERNESS IN MOUTH AND THROAT WITH OR WITHOUT PRESENCE OF ULCERS  *URINARY PROBLEMS  *BOWEL PROBLEMS  UNUSUAL RASH Items with * indicate a potential emergency and should be followed up as soon as possible.  One of the nurses will contact you 24 hours after your treatment. Please let the nurse know about any problems that you may have experienced. Feel free to call the clinic you have any questions or concerns. The clinic phone number is [phone].   I have been informed and understand all the instructions given to me. I know to contact the clinic, my physician, or go to the Emergency Department if any problems should occur. I do not have any questions at this time, but understand that I may call the clinic during office hours   should I have any questions or need assistance in obtaining follow up care.   Prochlorperazine tablets (Compazine) What is this medicine? PROCHLORPERAZINE (proe klor PER a zeen) helps to control severe nausea and vomiting. This medicine is also used to treat schizophrenia. It can also help patients who experience  anxiety that is not due to psychological illness. This medicine may be used for other purposes; ask your health care provider or pharmacist if you have questions. What should I tell my health care provider before I take this medicine? They need to know if you have any of these conditions: -blood disorders or disease -dementia -liver disease or jaundice -Parkinson's disease -uncontrollable movement disorder -an unusual or allergic reaction to prochlorperazine, other medicines, foods, dyes, or preservatives -pregnant or trying to get pregnant -breast-feeding How should I use this medicine? Take this medicine by mouth with a glass of water. Follow the directions on the prescription label. Take your doses at regular intervals. Do not take your medicine more often than directed. Do not stop taking this medicine suddenly. This can cause nausea, vomiting, and dizziness. Ask your doctor or health care professional for advice. Talk to your pediatrician regarding the use of this medicine in children. Special care may be needed. While this drug may be prescribed for children as young as 2 years for selected conditions, precautions do apply. Overdosage: If you think you have taken too much of this medicine contact a poison control center or emergency room at once. NOTE: This medicine is only for you. Do not share this medicine with others. What if I miss a dose? If you miss a dose, take it as soon as you can. If it is almost time for your next dose, take only that dose. Do not take double or extra doses. What may interact with this medicine? Do not take this medicine with any  of the following medications: -amoxapine -antidepressants like citalopram, escitalopram, fluoxetine, paroxetine, and sertraline -deferoxamine -dofetilide -maprotiline -tricyclic antidepressants like amitriptyline, clomipramine, imipramine, nortiptyline and others This medicine may also interact with the following  medications: -lithium -medicines for pain -phenytoin -propranolol -warfarin This list may not describe all possible interactions. Give your health care provider a list of all the medicines, herbs, non-prescription drugs, or dietary supplements you use. Also tell them if you smoke, drink alcohol, or use illegal drugs. Some items may interact with your medicine. What should I watch for while using this medicine? Visit your doctor or health care professional for regular checks on your progress. You may get drowsy or dizzy. Do not drive, use machinery, or do anything that needs mental alertness until you know how this medicine affects you. Do not stand or sit up quickly, especially if you are an older patient. This reduces the risk of dizzy or fainting spells. Alcohol may interfere with the effect of this medicine. Avoid alcoholic drinks. This medicine can reduce the response of your body to heat or cold. Try not to get overheated. Avoid temperature extremes, such as saunas, hot tubs, or very hot or cold baths or showers. Dress warmly in cold weather. This medicine can make you more sensitive to the sun. Keep out of the sun. If you cannot avoid being in the sun, wear protective clothing and use sunscreen. Do not use sun lamps or tanning beds/booths. Your mouth may get dry. Chewing sugarless gum or sucking hard candy, and drinking plenty of water may help. Contact your doctor if the problem does not go away or is severe. What side effects may I notice from receiving this medicine? Side effects that you should report to your doctor or health care professional as soon as possible: -blurred vision -breast enlargement in men or women -breast milk in women who are not breast-feeding -chest pain, fast or irregular heartbeat -confusion, restlessness -dark yellow or brown urine -difficulty breathing or swallowing -dizziness or fainting spells -drooling, shaking, movement difficulty (shuffling walk) or  rigidity -fever, chills, sore throat -involuntary or uncontrollable movements of the eyes, mouth, head, arms, and legs -seizures -stomach area pain -unusually weak or tired -unusual bleeding or bruising -yellowing of skin or eyes Side effects that usually do not require medical attention (report to your doctor or health care professional if they continue or are bothersome): -difficulty passing urine -difficulty sleeping -headache -sexual dysfunction -skin rash, or itching This list may not describe all possible side effects. Call your doctor for medical advice about side effects. You may report side effects to FDA at 1-800-FDA-1088. Where should I keep my medicine? Keep out of the reach of children. Store at room temperature between 15 and 30 degrees C (59 and 86 degrees F). Protect from light. Throw away any unused medicine after the expiration date. NOTE: This sheet is a summary. It may not cover all possible information. If you have questions about this medicine, talk to your doctor, pharmacist, or health care provider.  2012, [employer] Standard. (03/06/2008 4:26:50 PM)   Ondansetron injection (Zofran) What is this medicine? ONDANSETRON (on DAN se tron) is used to treat nausea and vomiting caused by chemotherapy. It is also used to prevent or treat nausea and vomiting after surgery. This medicine may be used for other purposes; ask your health care provider or pharmacist if you have questions. What should I tell my health care provider before I take this medicine? They need to know if you have any  of these conditions: -heart disease -history of irregular heartbeat -liver disease -low levels of magnesium or potassium in the blood -an unusual or allergic reaction to ondansetron, granisetron, other medicines, foods, dyes, or preservatives -pregnant or trying to get pregnant -breast-feeding How should I use this medicine? This medicine is for infusion into a vein. It is given by  a health care professional in a hospital or clinic setting. Talk to your pediatrician regarding the use of this medicine in children. Special care may be needed. Overdosage: If you think you have taken too much of this medicine contact a poison control center or emergency room at once. NOTE: This medicine is only for you. Do not share this medicine with others. What if I miss a dose? This does not apply. What may interact with this medicine? Do not take this medicine with any of the following medications: -apomorphine  This medicine may also interact with the following medications: -carbamazepine -phenytoin -rifampicin -tramadol This list may not describe all possible interactions. Give your health care provider a list of all the medicines, herbs, non-prescription drugs, or dietary supplements you use. Also tell them if you smoke, drink alcohol, or use illegal drugs. Some items may interact with your medicine. What should I watch for while using this medicine? Your condition will be monitored carefully while you are receiving this medicine. What side effects may I notice from receiving this medicine? Side effects that you should report to your doctor or health care professional as soon as possible: -allergic reactions like skin rash, itching or hives, swelling of the face, lips, or tongue -breathing problems -dizziness -fast or irregular heartbeat -feeling faint or lightheaded, falls -fever and chills -swelling of the hands and feet -tightness in the chest Side effects that usually do not require medical attention (report to your doctor or health care professional if they continue or are bothersome): -constipation or diarrhea -headache This list may not describe all possible side effects. Call your doctor for medical advice about side effects. You may report side effects to FDA at 1-800-FDA-1088. Where should I keep my medicine? This drug is given in a hospital or clinic and will not  be stored at home. NOTE: This sheet is a summary. It may not cover all possible information. If you have questions about this medicine, talk to your doctor, pharmacist, or health care provider.  2012, [employer] Standard. (05/08/2010 11:37:58 AM)  Trastuzumab injection for infusion (Herceptin) What is this medicine? TRASTUZUMAB (tras TOO zoo mab) is a monoclonal antibody. It targets a protein called HER2. This protein is found in some stomach and breast cancers. This medicine can stop cancer cell growth. This medicine may be used with other cancer treatments. This medicine may be used for other purposes; ask your health care provider or pharmacist if you have questions. What should I tell my health care provider before I take this medicine? They need to know if you have any of these conditions: -heart disease -heart failure -infection (especially a virus infection such as chickenpox, cold sores, or herpes) -lung or breathing disease, like asthma -recent or ongoing radiation therapy -an unusual or allergic reaction to trastuzumab, benzyl alcohol, or other medications, foods, dyes, or preservatives -pregnant or trying to get pregnant -breast-feeding How should I use this medicine? This drug is given as an infusion into a vein. It is administered in a hospital or clinic by a specially trained health care professional. Talk to your pediatrician regarding the use of this medicine in children. This  medicine is not approved for use in children. Overdosage: If you think you have taken too much of this medicine contact a poison control center or emergency room at once. NOTE: This medicine is only for you. Do not share this medicine with others. What if I miss a dose? It is important not to miss a dose. Call your doctor or health care professional if you are unable to keep an appointment. What may interact with this medicine? -cyclophosphamide -doxorubicin -warfarin This list may not describe  all possible interactions. Give your health care provider a list of all the medicines, herbs, non-prescription drugs, or dietary supplements you use. Also tell them if you smoke, drink alcohol, or use illegal drugs. Some items may interact with your medicine. What should I watch for while using this medicine? Visit your doctor for checks on your progress. Report any side effects. Continue your course of treatment even though you feel ill unless your doctor tells you to stop. Call your doctor or health care professional for advice if you get a fever, chills or sore throat, or other symptoms of a cold or flu. Do not treat yourself. Try to avoid being around people who are sick. You may experience fever, chills and shaking during your first infusion. These effects are usually mild and can be treated with other medicines. Report any side effects during the infusion to your health care professional. Fever and chills usually do not happen with later infusions. What side effects may I notice from receiving this medicine? Side effects that you should report to your doctor or other health care professional as soon as possible: -breathing difficulties -chest pain or palpitations -cough -dizziness or fainting -fever or chills, sore throat -skin rash, itching or hives -swelling of the legs or ankles -unusually weak or tired Side effects that usually do not require medical attention (report to your doctor or other health care professional if they continue or are bothersome): -loss of appetite -headache -muscle aches -nausea This list may not describe all possible side effects. Call your doctor for medical advice about side effects. You may report side effects to FDA at 1-800-FDA-1088. Where should I keep my medicine? This drug is given in a hospital or clinic and will not be stored at home. NOTE: This sheet is a summary. It may not cover all possible information. If you have questions about this medicine,  talk to your doctor, pharmacist, or health care provider.  2012, [employer] Standard. (06/08/2009 1:43:15 PM)  Pertuzumab injection What is this medicine? PERTUZUMAB  (Perjeta) is a monoclonal antibody that targets a protein called HER2. HER2 is found in some breast cancers. This medicine can stop cancer cell growth. This medicine is used with other cancer treatments. This medicine may be used for other purposes; ask your health care provider or pharmacist if you have questions. What should I tell my health care provider before I take this medicine? They need to know if you have any of these conditions:  -heart disease -heart failure -high blood pressure -history of irregular heart beat -recent or ongoing radiation therapy -an unusual or allergic reaction to pertuzumab, other medicines, foods, dyes, or preservatives -pregnant or trying to get pregnant -breast-feeding How should I use this medicine? This medicine is for infusion into a vein. It is given by a health care professional in a hospital or clinic setting. Talk to your pediatrician regarding the use of this medicine in children. Special care may be needed. Overdosage: If you think you've taken  too much of this medicine contact a poison control center or emergency room at once. Overdosage: If you think you have taken too much of this medicine contact a poison control center or emergency room at once. NOTE: This medicine is only for you. Do not share this medicine with others. What if I miss a dose? It is important not to miss your dose. Call your doctor or health care professional if you are unable to keep an appointment. What may interact with this medicine? Interactions are not expected. Give your health care provider a list of all the medicines, herbs, non-prescription drugs, or dietary supplements you use. Also tell them if you smoke, drink alcohol, or use illegal drugs. Some items may interact with your medicine. This list  may not describe all possible interactions. Give your health care provider a list of all the medicines, herbs, non-prescription drugs, or dietary supplements you use. Also tell them if you smoke, drink alcohol, or use illegal drugs. Some items may interact with your medicine. What should I watch for while using this medicine? Your condition will be monitored carefully while you are receiving this medicine. Report any side effects. Continue your course of treatment even though you feel ill unless your doctor tells you to stop. Do not become pregnant while taking this medicine. Women should inform their doctor if they wish to become pregnant or think they might be pregnant. There is a potential for serious side effects to an unborn child. Talk to your health care professional or pharmacist for more information. Do not breast-feed an infant while taking this medicine. Call your doctor or health care professional for advice if you get a fever, chills or sore throat, or other symptoms of a cold or flu. Do not treat yourself. Try to avoid being around people who are sick. You may experience fever, chills, and headache during the infusion. Report any side effects during the infusion to your health care professional. What side effects may I notice from receiving this medicine? Side effects that you should report to your doctor or health care professional as soon as possible: -breathing problems -chest pain or palpitations -dizziness -feeling faint or lightheaded -fever or chills -skin rash, itching or hives -sore throat -swelling of the face, lips, or tongue -swelling of the legs or ankles -unusually weak or tired  Side effects that usually do not require medical attention (Report these to your doctor or health care professional if they continue or are bothersome.): -diarrhea -hair loss -nausea, vomiting -tiredness This list may not describe all possible side effects. Call your doctor for medical  advice about side effects. You may report side effects to FDA at 1-800-FDA-1088. Where should I keep my medicine? This drug is given in a hospital or clinic and will not be stored at home. NOTE: This sheet is a summary. It may not cover all possible information. If you have questions about this medicine, talk to your doctor, pharmacist, or health care provider.  2013, [employer]vier/Gold Standard. (01/29/2011 4:45:56 PM)  Docetaxel injection What is this medicine? DOCETAXEL (Taxotere) (doe se TAX el) is a chemotherapy drug. It targets fast dividing cells, like cancer cells, and causes these cells to die. This medicine is used to treat many types of cancers like breast cancer, certain stomach cancers, head and neck cancer, lung cancer, and prostate cancer. This medicine may be used for other purposes; ask your health care provider or pharmacist if you have questions. What should I tell my health care provider  before I take this medicine? They need to know if you have any of these conditions: -infection (especially a virus infection such as chickenpox, cold sores, or herpes) -liver disease -low blood counts, like low white cell, platelet, or red cell counts -an unusual or allergic reaction to docetaxel, polysorbate 80, other chemotherapy agents, other medicines, foods, dyes, or preservatives -pregnant or trying to get pregnant -breast-feeding How should I use this medicine? This drug is given as an infusion into a vein. It is administered in a hospital or clinic by a specially trained health care professional. Talk to your pediatrician regarding the use of this medicine in children. Special care may be needed. Overdosage: If you think you have taken too much of this medicine contact a poison control center or emergency room at once. NOTE: This medicine is only for you. Do not share this medicine with others. What if I miss a dose? It is important not to miss your dose. Call your doctor or health care  professional if you are unable to keep an appointment. What may interact with this medicine? -cyclosporine -erythromycin -ketoconazole -medicines to increase blood counts like filgrastim, pegfilgrastim, sargramostim -vaccines Talk to your doctor or health care professional before taking any of these medicines: -acetaminophen -aspirin -ibuprofen -ketoprofen -naproxen This list may not describe all possible interactions. Give your health care provider a list of all the medicines, herbs, non-prescription drugs, or dietary supplements you use. Also tell them if you smoke, drink alcohol, or use illegal drugs. Some items may interact with your medicine. What should I watch for while using this medicine? Your condition will be monitored carefully while you are receiving this medicine. You will need important blood work done while you are taking this medicine. This drug may make you feel generally unwell. This is not uncommon, as chemotherapy can affect healthy cells as well as cancer cells. Report any side effects. Continue your course of treatment even though you feel ill unless your doctor tells you to stop. In some cases, you may be given additional medicines to help with side effects. Follow all directions for their use. Call your doctor or health care professional for advice if you get a fever, chills or sore throat, or other symptoms of a cold or flu. Do not treat yourself. This drug decreases your body's ability to fight infections. Try to avoid being around people who are sick. This medicine may increase your risk to bruise or bleed. Call your doctor or health care professional if you notice any unusual bleeding. Be careful brushing and flossing your teeth or using a toothpick because you may get an infection or bleed more easily. If you have any dental work done, tell your dentist you are receiving this medicine. Avoid taking products that contain aspirin, acetaminophen, ibuprofen, naproxen,  or ketoprofen unless instructed by your doctor. These medicines may hide a fever. Do not become pregnant while taking this medicine. Women should inform their doctor if they wish to become pregnant or think they might be pregnant. There is a potential for serious side effects to an unborn child. Talk to your health care professional or pharmacist for more information. Do not breast-feed an infant while taking this medicine. What side effects may I notice from receiving this medicine? Side effects that you should report to your doctor or health care professional as soon as possible: -allergic reactions like skin rash, itching or hives, swelling of the face, lips, or tongue -low blood counts - This drug may decrease  the number of white blood cells, red blood cells and platelets. You may be at increased risk for infections and bleeding. -signs of infection - fever or chills, cough, sore throat, pain or difficulty passing urine -signs of decreased platelets or bleeding - bruising, pinpoint red spots on the skin, black, tarry stools, nosebleeds -signs of decreased red blood cells - unusually weak or tired, fainting spells, lightheadedness -breathing problems -fast or irregular heartbeat -low blood pressure -mouth sores -nausea and vomiting -pain, swelling, redness or irritation at the injection site -pain, tingling, numbness in the hands or feet -swelling of the ankle, feet, hands -weight gain Side effects that usually do not require medical attention (report to your prescriber or health care professional if they continue or are bothersome): -bone pain -complete hair loss including hair on your head, underarms, pubic hair, eyebrows, and eyelashes -diarrhea -excessive tearing -changes in the color of fingernails -loosening of the fingernails -nausea -muscle pain -red flush to skin -sweating -weak or tired This list may not describe all possible side effects. Call your doctor for medical  advice about side effects. You may report side effects to FDA at 1-800-FDA-1088. Where should I keep my medicine? This drug is given in a hospital or clinic and will not be stored at home. NOTE: This sheet is a summary. It may not cover all possible information. If you have questions about this medicine, talk to your doctor, pharmacist, or health care provider.  2013, [employer] Standard. (07/17/2008 11:52:10 AM)

## 2012-12-07 ENCOUNTER — Other Ambulatory Visit: Payer: Self-pay | Admitting: Certified Registered Nurse Anesthetist

## 2012-12-07 ENCOUNTER — Telehealth: Payer: Self-pay | Admitting: *Deleted

## 2012-12-07 NOTE — Telephone Encounter (Signed)
Message copied by Augusto Garbe on Tue Dec 07, 2012  1:07 PM ------      Message from: Kallie Locks      Created: Mon Dec 06, 2012  5:07 PM      Regarding: "1st time Chemotherapy"      Contact: [phone]       Patient received Perjeta, Herceptin, Taxtotere 1st time today, per Dr. Darnelle Catalan; tolerated tx well. ------

## 2012-12-07 NOTE — Telephone Encounter (Signed)
Left message requesting return call for chemotherapy f/u.  Awaiting return call.

## 2012-12-08 ENCOUNTER — Ambulatory Visit (HOSPITAL_COMMUNITY): Admission: RE | Admit: 2012-12-08 | Payer: Medicare Other | Source: Ambulatory Visit | Admitting: Surgery

## 2012-12-08 ENCOUNTER — Encounter (HOSPITAL_COMMUNITY): Admission: RE | Payer: Self-pay | Source: Ambulatory Visit

## 2012-12-08 ENCOUNTER — Encounter: Payer: Self-pay | Admitting: Genetic Counselor

## 2012-12-08 SURGERY — INSERTION, TUNNELED CENTRAL VENOUS DEVICE, WITH PORT
Anesthesia: Choice

## 2012-12-09 ENCOUNTER — Other Ambulatory Visit: Payer: Self-pay | Admitting: *Deleted

## 2012-12-09 ENCOUNTER — Telehealth: Payer: Self-pay | Admitting: *Deleted

## 2012-12-09 MED ORDER — OMEPRAZOLE 40 MG PO CPDR: 40.0000 mg | DELAYED_RELEASE_CAPSULE | Freq: Every day | ORAL | Status: DC

## 2012-12-09 NOTE — Telephone Encounter (Signed)
Pt called to state onset of " stomach cramps and diarrhea ".  Per further inquiry pt states pain is below the breast above the umbilicus. " like bad indigestion and then I am gassy ". Pt states loose stools , not watery, but occuring " about everytime I try to eat something "  Verified pt's pharmacy and will review above with MD for recommendations.

## 2012-12-12 ENCOUNTER — Emergency Department (HOSPITAL_COMMUNITY)
Admission: EM | Admit: 2012-12-12 | Discharge: 2012-12-12 | Disposition: A | Discharge status: Home / Self Care | Payer: Medicare Other | Attending: Emergency Medicine | Admitting: Emergency Medicine

## 2012-12-12 ENCOUNTER — Telehealth: Payer: Self-pay | Admitting: Oncology

## 2012-12-12 ENCOUNTER — Encounter (HOSPITAL_COMMUNITY): Payer: Self-pay | Admitting: Emergency Medicine

## 2012-12-12 DIAGNOSIS — R197 Diarrhea, unspecified: Secondary | ICD-10-CM

## 2012-12-12 DIAGNOSIS — Z880 Allergy status to penicillin: Secondary | ICD-10-CM | POA: Insufficient documentation

## 2012-12-12 DIAGNOSIS — Z8739 Personal history of other diseases of the musculoskeletal system and connective tissue: Secondary | ICD-10-CM | POA: Insufficient documentation

## 2012-12-12 DIAGNOSIS — Z87891 Personal history of nicotine dependence: Secondary | ICD-10-CM | POA: Insufficient documentation

## 2012-12-12 DIAGNOSIS — E669 Obesity, unspecified: Secondary | ICD-10-CM | POA: Insufficient documentation

## 2012-12-12 DIAGNOSIS — Z8709 Personal history of other diseases of the respiratory system: Secondary | ICD-10-CM | POA: Insufficient documentation

## 2012-12-12 DIAGNOSIS — R110 Nausea: Secondary | ICD-10-CM | POA: Insufficient documentation

## 2012-12-12 DIAGNOSIS — C50919 Malignant neoplasm of unspecified site of unspecified female breast: Secondary | ICD-10-CM

## 2012-12-12 DIAGNOSIS — I10 Essential (primary) hypertension: Secondary | ICD-10-CM | POA: Insufficient documentation

## 2012-12-12 DIAGNOSIS — Z8719 Personal history of other diseases of the digestive system: Secondary | ICD-10-CM | POA: Insufficient documentation

## 2012-12-12 DIAGNOSIS — Z79899 Other long term (current) drug therapy: Secondary | ICD-10-CM | POA: Insufficient documentation

## 2012-12-12 DIAGNOSIS — E119 Type 2 diabetes mellitus without complications: Secondary | ICD-10-CM | POA: Insufficient documentation

## 2012-12-12 DIAGNOSIS — R109 Unspecified abdominal pain: Secondary | ICD-10-CM | POA: Insufficient documentation

## 2012-12-12 DIAGNOSIS — Z7982 Long term (current) use of aspirin: Secondary | ICD-10-CM | POA: Insufficient documentation

## 2012-12-12 LAB — CBC
HCT: 39.2 % (ref 36.0–46.0)
Hemoglobin: 13.4 g/dL (ref 12.0–15.0)
MCH: 28.5 pg (ref 26.0–34.0)
MCHC: 34.2 g/dL (ref 30.0–36.0)
MCV: 83.4 fL (ref 78.0–100.0)
Platelets: 262 10*3/uL (ref 150–400)
RBC: 4.7 MIL/uL (ref 3.87–5.11)
RDW: 13.7 % (ref 11.5–15.5)
WBC: 2.7 10*3/uL — ABNORMAL LOW (ref 4.0–10.5)

## 2012-12-12 LAB — COMPREHENSIVE METABOLIC PANEL
ALT: 109 U/L — ABNORMAL HIGH (ref 0–35)
AST: 58 U/L — ABNORMAL HIGH (ref 0–37)
Albumin: 3.2 g/dL — ABNORMAL LOW (ref 3.5–5.2)
Alkaline Phosphatase: 105 U/L (ref 39–117)
BUN: 19 mg/dL (ref 6–23)
CO2: 27 mEq/L (ref 19–32)
Calcium: 9.1 mg/dL (ref 8.4–10.5)
Chloride: 101 mEq/L (ref 96–112)
Creatinine, Ser: 0.85 mg/dL (ref 0.50–1.10)
GFR calc Af Amer: 83 mL/min — ABNORMAL LOW (ref >90)
GFR calc non Af Amer: 72 mL/min — ABNORMAL LOW (ref >90)
Glucose, Bld: 151 mg/dL — ABNORMAL HIGH (ref 70–99)
Potassium: 3.8 mEq/L (ref 3.5–5.1)
Sodium: 137 mEq/L (ref 135–145)
Total Bilirubin: 0.3 mg/dL (ref 0.3–1.2)
Total Protein: 7.1 g/dL (ref 6.0–8.3)

## 2012-12-12 LAB — URINALYSIS, ROUTINE W REFLEX MICROSCOPIC
Bilirubin Urine: NEGATIVE
Glucose, UA: NEGATIVE mg/dL
Hgb urine dipstick: NEGATIVE
Ketones, ur: NEGATIVE mg/dL
Leukocytes, UA: NEGATIVE
Nitrite: NEGATIVE
Protein, ur: NEGATIVE mg/dL
Specific Gravity, Urine: 1.015 (ref 1.005–1.030)
Urobilinogen, UA: 0.2 mg/dL (ref 0.0–1.0)
pH: 6.5 (ref 5.0–8.0)

## 2012-12-12 LAB — LIPASE, BLOOD: Lipase: 24 U/L (ref 11–59)

## 2012-12-12 MED ORDER — SODIUM CHLORIDE 0.9 % IV BOLUS (SEPSIS)
1000.0000 mL | Freq: Once | INTRAVENOUS | Status: AC
Start: 2012-12-12 — End: 2012-12-12
  Administered 2012-12-12: 1000 mL via INTRAVENOUS

## 2012-12-12 MED ORDER — SODIUM CHLORIDE 0.9 % IV BOLUS (SEPSIS)
1000.0000 mL | Freq: Once | INTRAVENOUS | Status: AC
  Administered 2012-12-12: 1000 mL via INTRAVENOUS

## 2012-12-12 MED ORDER — HEPARIN SOD (PORK) LOCK FLUSH 100 UNIT/ML IV SOLN
INTRAVENOUS | Status: AC
  Filled 2012-12-12: qty 5

## 2012-12-12 MED ORDER — LIDOCAINE-EPINEPHRINE-TETRACAINE (LET) SOLUTION
3.0000 mL | Freq: Once | NASAL | Status: AC
  Administered 2012-12-12: 3 mL via TOPICAL
  Filled 2012-12-12: qty 3

## 2012-12-12 MED ORDER — ONDANSETRON HCL 4 MG PO TABS: 4.0000 mg | ORAL_TABLET | Freq: Four times a day (QID) | ORAL | Status: DC

## 2012-12-12 NOTE — ED Provider Notes (Signed)
History     CSN: [id number]  Arrival date & time 12/12/12  1638   First MD Initiated Contact with Patient 12/12/12 1830      Chief Complaint  Patient presents with  . Diarrhea    since last chemo Rx on 12/02/12    (Consider location/radiation/quality/duration/timing/severity/associated sxs/prior treatment) HPI Pt presenting with c/o watery stools, lower abdominal cramping and weight loss over the past week since receiving chemotherapy.  She recently started chemo for breast cancer.  No vomiting, but has had nausea which has caused her to have decreased appetite.  No fever.  Pt has tried compazine for her nausea without much relief.  No abdominal pain.  Denies blood in stools. There are no other associated systemic symptoms, there are no other alleviating or modifying factors.   Past Medical History  Diagnosis Date  . Hypertension   . Other abnormal glucose   . Obesity, unspecified   . Unspecified sleep apnea   . Syncope and collapse   . Chest pain   . Suicide attempt 1996  . Fatty liver 6/03  . Lung disease   . Diabetes mellitus without complication   . Arthritis   . Back pain   . Breast cancer dx'd 11/2012    left    Past Surgical History  Procedure Laterality Date  . Cardiac catheterization      2007  . Cholecystectomy    . Tubal ligation      Family History  Problem Relation Age of Onset  . Coronary artery disease Father 71  . Diabetes Father   . Heart disease Father   . Breast cancer Mother 59  . Cancer Mother 30    breast  . Coronary artery disease Sister 71  . Coronary artery disease Brother 12  . Cancer Maternal Aunt 40    ovarian  . Cancer Maternal Grandmother 55    ovarian  . Cancer Paternal Aunt 37    ovarian/breast/breast    History  Substance Use Topics  . Smoking status: Former Smoker    Quit date: 01/01/1992  . Smokeless tobacco: Never Used  . Alcohol Use: No    OB History   Grav Para Term Preterm Abortions TAB SAB Ect Mult Living                  Review of Systems ROS reviewed and all otherwise negative except for mentioned in HPI  Allergies  Amoxicillin; Aspirin; Meperidine hcl; Penicillins; and Percocet  Home Medications   Current Outpatient Rx  Name  Route  Sig  Dispense  Refill  . albuterol (PROVENTIL HFA;VENTOLIN HFA) 108 (90 BASE) MCG/ACT inhaler   Inhalation   Inhale 2 puffs into the lungs every 6 (six) hours as needed for wheezing.   8.5 g   1     Please give a spacer.   Marland Kitchen amLODipine (NORVASC) 5 MG tablet   Oral   Take 5 mg by mouth every morning.         Marland Kitchen aspirin EC 81 MG EC tablet   Oral   Take 1 tablet (81 mg total) by mouth daily.   30 tablet   3   . carvedilol (COREG) 12.5 MG tablet   Oral   Take 1 tablet (12.5 mg total) by mouth 2 (two) times daily with a meal.   60 tablet   11   . lidocaine-prilocaine (EMLA) cream   Topical   Apply topically as needed.   30 g  1   . losartan-hydrochlorothiazide (HYZAAR) 100-12.5 MG per tablet   Oral   Take 1 tablet by mouth daily.         . metFORMIN (GLUCOPHAGE) 500 MG tablet   Oral   Take 1 tablet (500 mg total) by mouth 2 (two) times daily with a meal.   60 tablet   1   . omeprazole (PRILOSEC) 40 MG capsule   Oral   Take 1 capsule (40 mg total) by mouth daily.   30 capsule   1   . prochlorperazine (COMPAZINE) 10 MG tablet   Oral   Take 1 tablet (10 mg total) by mouth every 6 (six) hours as needed (Nausea or vomiting).   30 tablet   3   . Rivaroxaban (XARELTO) 20 MG TABS   Oral   Take 1 tablet (20 mg total) by mouth daily. Patient needs to start 20 mg daily on 11/19/12.   30 tablet   3   . ondansetron (ZOFRAN) 4 MG tablet   Oral   Take 1 tablet (4 mg total) by mouth every 6 (six) hours.   12 tablet   0   . EXPIRED: Rivaroxaban (XARELTO) 15 MG TABS tablet   Oral   Take 1 tablet (15 mg total) by mouth 2 (two) times daily with a meal.   20 tablet   0     Patient received free 10 days of Xarelto (via hosp ...      BP 139/91  Pulse 77  Temp(Src) 98.9 F (37.2 C) (Oral)  Resp 18  Wt 323 lb 4 oz (146.625 kg)  BMI 57.28 kg/m2  SpO2 94% Vitals reviewed Physical Exam Physical Examination: General appearance - alert, well appearing, and in no distress Mental status - alert, oriented to person, place, and time Eyes - no conjunctival injection, no scleral icterus Mouth - mucous membranes moist, pharynx normal without lesions Chest - clear to auscultation, no wheezes, rales or rhonchi, symmetric air entry Heart - normal rate, regular rhythm, normal S1, S2, no murmurs, rubs, clicks or gallops Abdomen - soft, nontender, nondistended, no masses or organomegaly, nabs Extremities - peripheral pulses normal, no pedal edema, no clubbing or cyanosis Skin - normal coloration and turgor, no rashes  ED Course  Procedures (including critical care time)  Labs Reviewed  CBC - Abnormal; Notable for the following:    WBC 2.7 (*)    All other components within normal limits  COMPREHENSIVE METABOLIC PANEL - Abnormal; Notable for the following:    Glucose, Bld 151 (*)    Albumin 3.2 (*)    AST 58 (*)    ALT 109 (*)    GFR calc non Af Amer 72 (*)    GFR calc Af Amer 83 (*)    All other components within normal limits  LIPASE, BLOOD  URINALYSIS, ROUTINE W REFLEX MICROSCOPIC   No results found.   1. Diarrhea   2. Breast cancer, unspecified laterality       MDM  Pt presenting with diarrhea associated with recent chemo treatment.  Also nausea and weight loss.  She has mild leukopenia but no fever or other suggestion of infection.  Pt has received IV hydration, feels improved and is requesting discharge.  Will give rx for zofran, encouraged to increase hydration and close f/u with Dr. Darnelle Catalan.  Discharged with strict return precautions.  Pt agreeable with plan.        Ethelda Chick, MD 12/12/12 2233

## 2012-12-12 NOTE — ED Notes (Signed)
Per patient request labs to be drawn by her Port.

## 2012-12-12 NOTE — Telephone Encounter (Signed)
Medical Oncology  On call for Dr Darnelle Catalan: patient calling with diarrhea ongoing since 4-22 after first chemo with pertuzumab/ herceptin/ taxotere. Initially this was loose stools, but now "just water" x3 today with last BM large amount, associated with abdominal cramping, no fever. She is not able to eat and has not been able to drink fluids today. She is using some pain medication, nothing otherwise for diarrhea. No one sick at home. I told patient that she needs to be evaluated at ED as she may be dehydrated or have electrolyte abnormalities now. She will get transportation to ED.  Jenna Mcgill, MD

## 2012-12-12 NOTE — ED Notes (Signed)
[id number]<AV> Expected date:<BR> Expected time:<BR> Means of arrival:<BR> Comments:<BR> Triage Marvis Moeller

## 2012-12-12 NOTE — ED Notes (Signed)
Pt presents w/ diarrhea and weight loss of 10 lbs since last chemo Rx on 11/29/12. Also reports lack of appetite and inability to eat d/t nausea, cramping and diarrhea

## 2012-12-13 ENCOUNTER — Telehealth: Payer: Self-pay | Admitting: *Deleted

## 2012-12-13 ENCOUNTER — Other Ambulatory Visit: Payer: Self-pay | Admitting: Oncology

## 2012-12-13 ENCOUNTER — Other Ambulatory Visit: Payer: Self-pay | Admitting: *Deleted

## 2012-12-13 DIAGNOSIS — C50219 Malignant neoplasm of upper-inner quadrant of unspecified female breast: Secondary | ICD-10-CM

## 2012-12-13 MED ORDER — ALBUTEROL SULFATE (5 MG/ML) 0.5% IN NEBU
INHALATION_SOLUTION | RESPIRATORY_TRACT | Status: AC
  Filled 2012-12-13: qty 0.5

## 2012-12-13 NOTE — Telephone Encounter (Signed)
This RN called pt per MD request to follow up post ER visit over the WE. Obtained verified VM- message left for pt to call this RN.

## 2012-12-14 ENCOUNTER — Encounter: Payer: Self-pay | Admitting: *Deleted

## 2012-12-14 ENCOUNTER — Encounter: Payer: Self-pay | Admitting: Physician Assistant

## 2012-12-14 ENCOUNTER — Other Ambulatory Visit: Payer: Self-pay | Admitting: *Deleted

## 2012-12-14 ENCOUNTER — Ambulatory Visit (HOSPITAL_BASED_OUTPATIENT_CLINIC_OR_DEPARTMENT_OTHER): Payer: Medicare Other | Admitting: Physician Assistant

## 2012-12-14 ENCOUNTER — Other Ambulatory Visit (HOSPITAL_BASED_OUTPATIENT_CLINIC_OR_DEPARTMENT_OTHER): Payer: Medicare Other | Admitting: Lab

## 2012-12-14 ENCOUNTER — Telehealth: Payer: Self-pay | Admitting: *Deleted

## 2012-12-14 VITALS — BP 114/71 | HR 73 | Temp 97.7°F | Resp 20 | Ht 63.0 in | Wt 326.0 lb

## 2012-12-14 DIAGNOSIS — C50919 Malignant neoplasm of unspecified site of unspecified female breast: Secondary | ICD-10-CM | POA: Insufficient documentation

## 2012-12-14 DIAGNOSIS — C7800 Secondary malignant neoplasm of unspecified lung: Secondary | ICD-10-CM

## 2012-12-14 DIAGNOSIS — R197 Diarrhea, unspecified: Secondary | ICD-10-CM

## 2012-12-14 DIAGNOSIS — C50219 Malignant neoplasm of upper-inner quadrant of unspecified female breast: Secondary | ICD-10-CM

## 2012-12-14 DIAGNOSIS — C50212 Malignant neoplasm of upper-inner quadrant of left female breast: Secondary | ICD-10-CM

## 2012-12-14 DIAGNOSIS — C7802 Secondary malignant neoplasm of left lung: Secondary | ICD-10-CM

## 2012-12-14 DIAGNOSIS — C50912 Malignant neoplasm of unspecified site of left female breast: Secondary | ICD-10-CM

## 2012-12-14 DIAGNOSIS — Z86718 Personal history of other venous thrombosis and embolism: Secondary | ICD-10-CM

## 2012-12-14 LAB — CBC WITH DIFFERENTIAL/PLATELET
BASO%: 0.6 % (ref 0.0–2.0)
Basophils Absolute: 0 10*3/uL (ref 0.0–0.1)
EOS%: 3.2 % (ref 0.0–7.0)
Eosinophils Absolute: 0.1 10*3/uL (ref 0.0–0.5)
HCT: 38.5 % (ref 34.8–46.6)
HGB: 12.9 g/dL (ref 11.6–15.9)
LYMPH%: 59.7 % — ABNORMAL HIGH (ref 14.0–49.7)
MCH: 28.3 pg (ref 25.1–34.0)
MCHC: 33.5 g/dL (ref 31.5–36.0)
MCV: 84.4 fL (ref 79.5–101.0)
MONO#: 0.1 10*3/uL (ref 0.1–0.9)
MONO%: 7.8 % (ref 0.0–14.0)
NEUT#: 0.4 10*3/uL — CL (ref 1.5–6.5)
NEUT%: 28.7 % — ABNORMAL LOW (ref 38.4–76.8)
Platelets: 281 10*3/uL (ref 145–400)
RBC: 4.56 10*6/uL (ref 3.70–5.45)
RDW: 14.1 % (ref 11.2–14.5)
WBC: 1.5 10*3/uL — ABNORMAL LOW (ref 3.9–10.3)
lymph#: 0.9 10*3/uL (ref 0.9–3.3)

## 2012-12-14 MED ORDER — CIPROFLOXACIN HCL 500 MG PO TABS: 500.0000 mg | ORAL_TABLET | Freq: Two times a day (BID) | ORAL | Status: DC

## 2012-12-14 MED ORDER — CHOLESTYRAMINE 4 G PO PACK: 1.0000 | PACK | Freq: Two times a day (BID) | ORAL | Status: DC

## 2012-12-14 NOTE — Progress Notes (Signed)
Mailed after appt letter to pt. 
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
## 2012-12-14 NOTE — Telephone Encounter (Signed)
appts made and printed.the patient is aware that her tx time will be adjusted for 01/17/13...td

## 2012-12-21 ENCOUNTER — Other Ambulatory Visit (HOSPITAL_BASED_OUTPATIENT_CLINIC_OR_DEPARTMENT_OTHER): Payer: Medicare Other | Admitting: Lab

## 2012-12-21 DIAGNOSIS — C50219 Malignant neoplasm of upper-inner quadrant of unspecified female breast: Secondary | ICD-10-CM

## 2012-12-21 DIAGNOSIS — C50912 Malignant neoplasm of unspecified site of left female breast: Secondary | ICD-10-CM

## 2012-12-21 DIAGNOSIS — C7802 Secondary malignant neoplasm of left lung: Secondary | ICD-10-CM

## 2012-12-21 DIAGNOSIS — C50212 Malignant neoplasm of upper-inner quadrant of left female breast: Secondary | ICD-10-CM

## 2012-12-21 LAB — CBC WITH DIFFERENTIAL/PLATELET
BASO%: 0.5 % (ref 0.0–2.0)
Basophils Absolute: 0 10*3/uL (ref 0.0–0.1)
EOS%: 0.2 % (ref 0.0–7.0)
Eosinophils Absolute: 0 10*3/uL (ref 0.0–0.5)
HCT: 38.5 % (ref 34.8–46.6)
HGB: 12.9 g/dL (ref 11.6–15.9)
LYMPH%: 37.3 % (ref 14.0–49.7)
MCH: 28 pg (ref 25.1–34.0)
MCHC: 33.5 g/dL (ref 31.5–36.0)
MCV: 83.7 fL (ref 79.5–101.0)
MONO#: 0.6 10*3/uL (ref 0.1–0.9)
MONO%: 14.6 % — ABNORMAL HIGH (ref 0.0–14.0)
NEUT#: 2.1 10*3/uL (ref 1.5–6.5)
NEUT%: 47.4 % (ref 38.4–76.8)
Platelets: 235 10*3/uL (ref 145–400)
RBC: 4.6 10*6/uL (ref 3.70–5.45)
RDW: 14.2 % (ref 11.2–14.5)
WBC: 4.4 10*3/uL (ref 3.9–10.3)
lymph#: 1.6 10*3/uL (ref 0.9–3.3)
nRBC: 0 % (ref 0–0)

## 2012-12-27 ENCOUNTER — Other Ambulatory Visit (HOSPITAL_BASED_OUTPATIENT_CLINIC_OR_DEPARTMENT_OTHER): Payer: Medicare Other | Admitting: Lab

## 2012-12-27 ENCOUNTER — Other Ambulatory Visit: Payer: Self-pay | Admitting: Oncology

## 2012-12-27 ENCOUNTER — Ambulatory Visit (HOSPITAL_BASED_OUTPATIENT_CLINIC_OR_DEPARTMENT_OTHER): Payer: Medicare Other

## 2012-12-27 ENCOUNTER — Encounter: Payer: Self-pay | Admitting: Physician Assistant

## 2012-12-27 ENCOUNTER — Telehealth: Payer: Self-pay | Admitting: *Deleted

## 2012-12-27 ENCOUNTER — Ambulatory Visit (HOSPITAL_BASED_OUTPATIENT_CLINIC_OR_DEPARTMENT_OTHER): Payer: Medicare Other | Admitting: Physician Assistant

## 2012-12-27 VITALS — BP 133/73 | HR 71 | Temp 98.5°F | Resp 20 | Ht 63.0 in | Wt 325.4 lb

## 2012-12-27 DIAGNOSIS — Z5112 Encounter for antineoplastic immunotherapy: Secondary | ICD-10-CM

## 2012-12-27 DIAGNOSIS — C50212 Malignant neoplasm of upper-inner quadrant of left female breast: Secondary | ICD-10-CM

## 2012-12-27 DIAGNOSIS — C50219 Malignant neoplasm of upper-inner quadrant of unspecified female breast: Secondary | ICD-10-CM

## 2012-12-27 DIAGNOSIS — Z5111 Encounter for antineoplastic chemotherapy: Secondary | ICD-10-CM

## 2012-12-27 DIAGNOSIS — C7800 Secondary malignant neoplasm of unspecified lung: Secondary | ICD-10-CM

## 2012-12-27 DIAGNOSIS — I82629 Acute embolism and thrombosis of deep veins of unspecified upper extremity: Secondary | ICD-10-CM

## 2012-12-27 DIAGNOSIS — C7802 Secondary malignant neoplasm of left lung: Secondary | ICD-10-CM

## 2012-12-27 DIAGNOSIS — F411 Generalized anxiety disorder: Secondary | ICD-10-CM

## 2012-12-27 DIAGNOSIS — C50912 Malignant neoplasm of unspecified site of left female breast: Secondary | ICD-10-CM

## 2012-12-27 DIAGNOSIS — F419 Anxiety disorder, unspecified: Secondary | ICD-10-CM

## 2012-12-27 LAB — COMPREHENSIVE METABOLIC PANEL (CC13)
ALT: 79 U/L — ABNORMAL HIGH (ref 0–55)
AST: 33 U/L (ref 5–34)
Albumin: 3.2 g/dL — ABNORMAL LOW (ref 3.5–5.0)
Alkaline Phosphatase: 159 U/L — ABNORMAL HIGH (ref 40–150)
BUN: 17.5 mg/dL (ref 7.0–26.0)
CO2: 28 mEq/L (ref 22–29)
Calcium: 9.5 mg/dL (ref 8.4–10.4)
Chloride: 102 mEq/L (ref 98–107)
Creatinine: 0.8 mg/dL (ref 0.6–1.1)
Glucose: 118 mg/dl — ABNORMAL HIGH (ref 70–99)
Potassium: 4.3 mEq/L (ref 3.5–5.1)
Sodium: 140 mEq/L (ref 136–145)
Total Bilirubin: 0.32 mg/dL (ref 0.20–1.20)
Total Protein: 7.5 g/dL (ref 6.4–8.3)

## 2012-12-27 LAB — CBC WITH DIFFERENTIAL/PLATELET
BASO%: 0.2 % (ref 0.0–2.0)
Basophils Absolute: 0 10*3/uL (ref 0.0–0.1)
EOS%: 0.1 % (ref 0.0–7.0)
Eosinophils Absolute: 0 10*3/uL (ref 0.0–0.5)
HCT: 41.5 % (ref 34.8–46.6)
HGB: 13.8 g/dL (ref 11.6–15.9)
LYMPH%: 18.8 % (ref 14.0–49.7)
MCH: 27.8 pg (ref 25.1–34.0)
MCHC: 33.3 g/dL (ref 31.5–36.0)
MCV: 83.5 fL (ref 79.5–101.0)
MONO#: 0.7 10*3/uL (ref 0.1–0.9)
MONO%: 6.2 % (ref 0.0–14.0)
NEUT#: 8.5 10*3/uL — ABNORMAL HIGH (ref 1.5–6.5)
NEUT%: 74.7 % (ref 38.4–76.8)
Platelets: 256 10*3/uL (ref 145–400)
RBC: 4.97 10*6/uL (ref 3.70–5.45)
RDW: 14.5 % (ref 11.2–14.5)
WBC: 11.3 10*3/uL — ABNORMAL HIGH (ref 3.9–10.3)
lymph#: 2.1 10*3/uL (ref 0.9–3.3)
nRBC: 0 % (ref 0–0)

## 2012-12-27 MED ORDER — ACETAMINOPHEN 325 MG PO TABS
650.0000 mg | ORAL_TABLET | Freq: Once | ORAL | Status: AC
  Administered 2012-12-27: 650 mg via ORAL

## 2012-12-27 MED ORDER — PERTUZUMAB CHEMO INJECTION 420 MG/14ML
420.0000 mg | Freq: Once | INTRAVENOUS | Status: AC
  Administered 2012-12-27: 420 mg via INTRAVENOUS
  Filled 2012-12-27: qty 14

## 2012-12-27 MED ORDER — LORAZEPAM 0.5 MG PO TABS: ORAL_TABLET | ORAL | Status: DC

## 2012-12-27 MED ORDER — SODIUM CHLORIDE 0.9 % IV SOLN
190.0000 mg | Freq: Once | INTRAVENOUS | Status: AC
  Administered 2012-12-27: 190 mg via INTRAVENOUS
  Filled 2012-12-27: qty 19

## 2012-12-27 MED ORDER — HEPARIN SOD (PORK) LOCK FLUSH 100 UNIT/ML IV SOLN
500.0000 [IU] | Freq: Once | INTRAVENOUS | Status: AC | PRN
  Administered 2012-12-27: 500 [IU]
  Filled 2012-12-27: qty 5

## 2012-12-27 MED ORDER — DOCETAXEL CHEMO INJECTION 160 MG/16ML: 75.0000 mg/m2 | Freq: Once | INTRAVENOUS | Status: DC

## 2012-12-27 MED ORDER — ONDANSETRON 8 MG/50ML IVPB (CHCC)
8.0000 mg | Freq: Once | INTRAVENOUS | Status: AC
  Administered 2012-12-27: 8 mg via INTRAVENOUS

## 2012-12-27 MED ORDER — TRASTUZUMAB CHEMO INJECTION 440 MG
6.0000 mg/kg | Freq: Once | INTRAVENOUS | Status: AC
  Administered 2012-12-27: 903 mg via INTRAVENOUS
  Filled 2012-12-27: qty 43

## 2012-12-27 MED ORDER — DEXAMETHASONE SODIUM PHOSPHATE 10 MG/ML IJ SOLN
10.0000 mg | Freq: Once | INTRAMUSCULAR | Status: AC
  Administered 2012-12-27: 10 mg via INTRAVENOUS

## 2012-12-27 MED ORDER — SODIUM CHLORIDE 0.9 % IJ SOLN
10.0000 mL | INTRAMUSCULAR | Status: DC | PRN
  Administered 2012-12-27: 10 mL
  Filled 2012-12-27: qty 10

## 2012-12-27 MED ORDER — DIPHENHYDRAMINE HCL 25 MG PO CAPS
25.0000 mg | ORAL_CAPSULE | Freq: Once | ORAL | Status: AC
  Administered 2012-12-27: 25 mg via ORAL

## 2012-12-27 MED ORDER — SODIUM CHLORIDE 0.9 % IV SOLN
Freq: Once | INTRAVENOUS | Status: AC
  Administered 2012-12-27: 12:00:00 via INTRAVENOUS

## 2012-12-27 NOTE — Telephone Encounter (Signed)
appts made and printed. Pt is aware that cs will call her with an appt d/t for her CT scan...td
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
## 2012-12-27 NOTE — Patient Instructions (Signed)
[facility] Discharge Instructions for Patients Receiving Chemotherapy  Today you received the following chemotherapy agents Taxotere/Herceptin/Perjeta To help prevent nausea and vomiting after your treatment, we encourage you to take your nausea medication as prescribed.If you develop nausea and vomiting that is not controlled by your nausea medication, call the clinic. If it is after clinic hours your family physician or the after hours number for the clinic or go to the Emergency Department.   BELOW ARE SYMPTOMS THAT SHOULD BE REPORTED IMMEDIATELY:  *FEVER GREATER THAN 100.5 F  *CHILLS WITH OR WITHOUT FEVER  NAUSEA AND VOMITING THAT IS NOT CONTROLLED WITH YOUR NAUSEA MEDICATION  *UNUSUAL SHORTNESS OF BREATH  *UNUSUAL BRUISING OR BLEEDING  TENDERNESS IN MOUTH AND THROAT WITH OR WITHOUT PRESENCE OF ULCERS  *URINARY PROBLEMS  *BOWEL PROBLEMS  UNUSUAL RASH Items with * indicate a potential emergency and should be followed up as soon as possible.  One of the nurses will contact you 24 hours after your treatment. Please let the nurse know about any problems that you may have experienced. Feel free to call the clinic you have any questions or concerns. The clinic phone number is [phone].   I have been informed and understand all the instructions given to me. I know to contact the clinic, my physician, or go to the Emergency Department if any problems should occur. I do not have any questions at this time, but understand that I may call the clinic during office hours   should I have any questions or need assistance in obtaining follow up care.    __________________________________________  _____________  __________ Signature of Patient or Authorized Representative            Date                   Time    __________________________________________ Nurse's Signature    

## 2012-12-27 NOTE — Telephone Encounter (Signed)
Per staff message and POF I have scheduled appts.  JMW  

## 2012-12-28 ENCOUNTER — Ambulatory Visit (HOSPITAL_BASED_OUTPATIENT_CLINIC_OR_DEPARTMENT_OTHER): Payer: Medicare Other

## 2012-12-28 VITALS — BP 146/71 | HR 83 | Temp 97.8°F | Resp 24

## 2012-12-28 DIAGNOSIS — C50219 Malignant neoplasm of upper-inner quadrant of unspecified female breast: Secondary | ICD-10-CM

## 2012-12-28 MED ORDER — PEGFILGRASTIM INJECTION 6 MG/0.6ML
6.0000 mg | Freq: Once | SUBCUTANEOUS | Status: AC
  Administered 2012-12-28: 6 mg via SUBCUTANEOUS
  Filled 2012-12-28: qty 0.6

## 2012-12-29 ENCOUNTER — Telehealth: Payer: Self-pay | Admitting: Oncology

## 2012-12-29 ENCOUNTER — Encounter: Payer: Self-pay | Admitting: Genetic Counselor

## 2012-12-29 ENCOUNTER — Other Ambulatory Visit: Payer: Self-pay | Admitting: Certified Registered Nurse Anesthetist

## 2012-12-30 ENCOUNTER — Other Ambulatory Visit: Payer: Self-pay | Admitting: Oncology

## 2012-12-30 NOTE — Progress Notes (Signed)
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

## 2013-01-03 ENCOUNTER — Encounter: Payer: Self-pay | Admitting: Physician Assistant

## 2013-01-03 ENCOUNTER — Encounter: Payer: Self-pay | Admitting: *Deleted

## 2013-01-03 ENCOUNTER — Ambulatory Visit (HOSPITAL_BASED_OUTPATIENT_CLINIC_OR_DEPARTMENT_OTHER): Payer: Medicare Other | Admitting: Physician Assistant

## 2013-01-03 ENCOUNTER — Other Ambulatory Visit: Payer: Self-pay | Admitting: Cardiology

## 2013-01-03 ENCOUNTER — Other Ambulatory Visit (HOSPITAL_BASED_OUTPATIENT_CLINIC_OR_DEPARTMENT_OTHER): Payer: Medicare Other | Admitting: Lab

## 2013-01-03 VITALS — BP 147/79 | HR 82 | Temp 98.0°F | Resp 20 | Ht 63.0 in | Wt 323.2 lb

## 2013-01-03 DIAGNOSIS — C7802 Secondary malignant neoplasm of left lung: Secondary | ICD-10-CM

## 2013-01-03 DIAGNOSIS — C50219 Malignant neoplasm of upper-inner quadrant of unspecified female breast: Secondary | ICD-10-CM

## 2013-01-03 DIAGNOSIS — C50912 Malignant neoplasm of unspecified site of left female breast: Secondary | ICD-10-CM

## 2013-01-03 DIAGNOSIS — C50212 Malignant neoplasm of upper-inner quadrant of left female breast: Secondary | ICD-10-CM

## 2013-01-03 DIAGNOSIS — I82629 Acute embolism and thrombosis of deep veins of unspecified upper extremity: Secondary | ICD-10-CM

## 2013-01-03 DIAGNOSIS — B379 Candidiasis, unspecified: Secondary | ICD-10-CM

## 2013-01-03 DIAGNOSIS — C7800 Secondary malignant neoplasm of unspecified lung: Secondary | ICD-10-CM

## 2013-01-03 DIAGNOSIS — R197 Diarrhea, unspecified: Secondary | ICD-10-CM

## 2013-01-03 DIAGNOSIS — F411 Generalized anxiety disorder: Secondary | ICD-10-CM

## 2013-01-03 LAB — CBC WITH DIFFERENTIAL/PLATELET
BASO%: 0.6 % (ref 0.0–2.0)
Basophils Absolute: 0.1 10*3/uL (ref 0.0–0.1)
EOS%: 1.1 % (ref 0.0–7.0)
Eosinophils Absolute: 0.1 10*3/uL (ref 0.0–0.5)
HCT: 38.5 % (ref 34.8–46.6)
HGB: 12.9 g/dL (ref 11.6–15.9)
LYMPH%: 21.3 % (ref 14.0–49.7)
MCH: 27.6 pg (ref 25.1–34.0)
MCHC: 33.5 g/dL (ref 31.5–36.0)
MCV: 82.3 fL (ref 79.5–101.0)
MONO#: 1.2 10*3/uL — ABNORMAL HIGH (ref 0.1–0.9)
MONO%: 10.1 % (ref 0.0–14.0)
NEUT#: 7.7 10*3/uL — ABNORMAL HIGH (ref 1.5–6.5)
NEUT%: 66.9 % (ref 38.4–76.8)
Platelets: 257 10*3/uL (ref 145–400)
RBC: 4.68 10*6/uL (ref 3.70–5.45)
RDW: 14.8 % — ABNORMAL HIGH (ref 11.2–14.5)
WBC: 11.5 10*3/uL — ABNORMAL HIGH (ref 3.9–10.3)
lymph#: 2.4 10*3/uL (ref 0.9–3.3)

## 2013-01-03 MED ORDER — NYSTATIN 100000 UNIT/GM EX POWD: Freq: Three times a day (TID) | CUTANEOUS | Status: DC

## 2013-01-03 MED ORDER — FLUCONAZOLE 100 MG PO TABS: 100.0000 mg | ORAL_TABLET | Freq: Every day | ORAL | Status: DC
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
## 2013-01-17 ENCOUNTER — Other Ambulatory Visit: Payer: Medicare Other | Admitting: Lab

## 2013-01-17 ENCOUNTER — Ambulatory Visit (HOSPITAL_BASED_OUTPATIENT_CLINIC_OR_DEPARTMENT_OTHER): Payer: Medicare Other

## 2013-01-17 ENCOUNTER — Ambulatory Visit (HOSPITAL_BASED_OUTPATIENT_CLINIC_OR_DEPARTMENT_OTHER): Payer: Medicare Other | Admitting: Oncology

## 2013-01-17 ENCOUNTER — Other Ambulatory Visit (HOSPITAL_BASED_OUTPATIENT_CLINIC_OR_DEPARTMENT_OTHER): Payer: Medicare Other | Admitting: Lab

## 2013-01-17 VITALS — BP 120/66 | HR 74 | Temp 98.9°F | Resp 20 | Ht 63.0 in | Wt 326.0 lb

## 2013-01-17 DIAGNOSIS — C7800 Secondary malignant neoplasm of unspecified lung: Secondary | ICD-10-CM

## 2013-01-17 DIAGNOSIS — C50219 Malignant neoplasm of upper-inner quadrant of unspecified female breast: Secondary | ICD-10-CM

## 2013-01-17 DIAGNOSIS — C50212 Malignant neoplasm of upper-inner quadrant of left female breast: Secondary | ICD-10-CM

## 2013-01-17 DIAGNOSIS — B379 Candidiasis, unspecified: Secondary | ICD-10-CM

## 2013-01-17 DIAGNOSIS — C7802 Secondary malignant neoplasm of left lung: Secondary | ICD-10-CM

## 2013-01-17 DIAGNOSIS — Z5112 Encounter for antineoplastic immunotherapy: Secondary | ICD-10-CM

## 2013-01-17 DIAGNOSIS — I82629 Acute embolism and thrombosis of deep veins of unspecified upper extremity: Secondary | ICD-10-CM

## 2013-01-17 DIAGNOSIS — Z5111 Encounter for antineoplastic chemotherapy: Secondary | ICD-10-CM

## 2013-01-17 DIAGNOSIS — C50912 Malignant neoplasm of unspecified site of left female breast: Secondary | ICD-10-CM

## 2013-01-17 LAB — COMPREHENSIVE METABOLIC PANEL (CC13)
ALT: 78 U/L — ABNORMAL HIGH (ref 0–55)
AST: 38 U/L — ABNORMAL HIGH (ref 5–34)
Albumin: 3.1 g/dL — ABNORMAL LOW (ref 3.5–5.0)
Alkaline Phosphatase: 115 U/L (ref 40–150)
BUN: 17.2 mg/dL (ref 7.0–26.0)
CO2: 27 meq/L (ref 22–29)
Calcium: 9.3 mg/dL (ref 8.4–10.4)
Chloride: 103 meq/L (ref 98–107)
Creatinine: 0.8 mg/dL (ref 0.6–1.1)
Glucose: 133 mg/dL — ABNORMAL HIGH (ref 70–99)
Potassium: 4.1 meq/L (ref 3.5–5.1)
Sodium: 140 meq/L (ref 136–145)
Total Bilirubin: 0.33 mg/dL (ref 0.20–1.20)
Total Protein: 7 g/dL (ref 6.4–8.3)

## 2013-01-17 LAB — CBC WITH DIFFERENTIAL/PLATELET
BASO%: 0.3 % (ref 0.0–2.0)
Basophils Absolute: 0 10e3/uL (ref 0.0–0.1)
EOS%: 0.1 % (ref 0.0–7.0)
Eosinophils Absolute: 0 10e3/uL (ref 0.0–0.5)
HCT: 37.5 % (ref 34.8–46.6)
HGB: 12.5 g/dL (ref 11.6–15.9)
LYMPH%: 24.8 % (ref 14.0–49.7)
MCH: 28 pg (ref 25.1–34.0)
MCHC: 33.3 g/dL (ref 31.5–36.0)
MCV: 83.9 fL (ref 79.5–101.0)
MONO#: 0.6 10e3/uL (ref 0.1–0.9)
MONO%: 8.8 % (ref 0.0–14.0)
NEUT#: 4.6 10e3/uL (ref 1.5–6.5)
NEUT%: 66 % (ref 38.4–76.8)
Platelets: 344 10e3/uL (ref 145–400)
RBC: 4.47 10e6/uL (ref 3.70–5.45)
RDW: 16.5 % — ABNORMAL HIGH (ref 11.2–14.5)
WBC: 6.9 10e3/uL (ref 3.9–10.3)
lymph#: 1.7 10e3/uL (ref 0.9–3.3)
nRBC: 0 % (ref 0–0)

## 2013-01-17 MED ORDER — DIPHENHYDRAMINE HCL 25 MG PO CAPS
25.0000 mg | ORAL_CAPSULE | Freq: Once | ORAL | Status: AC
  Administered 2013-01-17: 25 mg via ORAL

## 2013-01-17 MED ORDER — DEXAMETHASONE SODIUM PHOSPHATE 10 MG/ML IJ SOLN
10.0000 mg | Freq: Once | INTRAMUSCULAR | Status: AC
  Administered 2013-01-17: 10 mg via INTRAVENOUS

## 2013-01-17 MED ORDER — SODIUM CHLORIDE 0.9 % IV SOLN
420.0000 mg | Freq: Once | INTRAVENOUS | Status: AC
  Administered 2013-01-17: 420 mg via INTRAVENOUS
  Filled 2013-01-17: qty 14

## 2013-01-17 MED ORDER — SODIUM CHLORIDE 0.9 % IV SOLN
75.0000 mg/m2 | Freq: Once | INTRAVENOUS | Status: AC
  Administered 2013-01-17: 190 mg via INTRAVENOUS
  Filled 2013-01-17: qty 19

## 2013-01-17 MED ORDER — ONDANSETRON 8 MG/50ML IVPB (CHCC)
8.0000 mg | Freq: Once | INTRAVENOUS | Status: AC
  Administered 2013-01-17: 8 mg via INTRAVENOUS

## 2013-01-17 MED ORDER — SODIUM CHLORIDE 0.9 % IV SOLN
Freq: Once | INTRAVENOUS | Status: AC
  Administered 2013-01-17: 12:00:00 via INTRAVENOUS

## 2013-01-17 MED ORDER — HEPARIN SOD (PORK) LOCK FLUSH 100 UNIT/ML IV SOLN
500.0000 [IU] | Freq: Once | INTRAVENOUS | Status: AC | PRN
  Administered 2013-01-17: 500 [IU]
  Filled 2013-01-17: qty 5

## 2013-01-17 MED ORDER — SODIUM CHLORIDE 0.9 % IV SOLN
6.0000 mg/kg | Freq: Once | INTRAVENOUS | Status: AC
  Administered 2013-01-17: 903 mg via INTRAVENOUS
  Filled 2013-01-17: qty 43

## 2013-01-17 MED ORDER — ACETAMINOPHEN 325 MG PO TABS
650.0000 mg | ORAL_TABLET | Freq: Once | ORAL | Status: AC
  Administered 2013-01-17: 650 mg via ORAL

## 2013-01-17 MED ORDER — SODIUM CHLORIDE 0.9 % IJ SOLN
10.0000 mL | INTRAMUSCULAR | Status: DC | PRN
  Administered 2013-01-17: 10 mL
  Filled 2013-01-17: qty 10
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
## 2013-01-17 NOTE — Patient Instructions (Addendum)
[facility] Discharge Instructions for Patients Receiving Chemotherapy  Today you received the following chemotherapy agents Taxotere, Herceptin and Perjeta.  To help prevent nausea and vomiting after your treatment, we encourage you to take your nausea medication.   If you develop nausea and vomiting that is not controlled by your nausea medication, call the clinic. If it is after clinic hours your family physician or the after hours number for the clinic or go to the Emergency Department.   BELOW ARE SYMPTOMS THAT SHOULD BE REPORTED IMMEDIATELY:  *FEVER GREATER THAN 100.5 F  *CHILLS WITH OR WITHOUT FEVER  NAUSEA AND VOMITING THAT IS NOT CONTROLLED WITH YOUR NAUSEA MEDICATION  *UNUSUAL SHORTNESS OF BREATH  *UNUSUAL BRUISING OR BLEEDING  TENDERNESS IN MOUTH AND THROAT WITH OR WITHOUT PRESENCE OF ULCERS  *URINARY PROBLEMS  *BOWEL PROBLEMS  UNUSUAL RASH Items with * indicate a potential emergency and should be followed up as soon as possible.  One of the nurses will contact you 24 hours after your treatment. Please let the nurse know about any problems that you may have experienced. Feel free to call the clinic you have any questions or concerns. The clinic phone number is [phone].   I have been informed and understand all the instructions given to me. I know to contact the clinic, my physician, or go to the Emergency Department if any problems should occur. I do not have any questions at this time, but understand that I may call the clinic during office hours   should I have any questions or need assistance in obtaining follow up care.    __________________________________________  _____________  __________ Signature of Patient or Authorized Representative            Date                   Time    __________________________________________ Nurse's Signature

## 2013-01-18 ENCOUNTER — Ambulatory Visit (HOSPITAL_BASED_OUTPATIENT_CLINIC_OR_DEPARTMENT_OTHER): Payer: Medicare Other

## 2013-01-18 VITALS — BP 131/65 | HR 78 | Temp 97.5°F

## 2013-01-18 DIAGNOSIS — C50219 Malignant neoplasm of upper-inner quadrant of unspecified female breast: Secondary | ICD-10-CM

## 2013-01-18 DIAGNOSIS — C7800 Secondary malignant neoplasm of unspecified lung: Secondary | ICD-10-CM

## 2013-01-18 DIAGNOSIS — Z5189 Encounter for other specified aftercare: Secondary | ICD-10-CM

## 2013-01-18 MED ORDER — PEGFILGRASTIM INJECTION 6 MG/0.6ML
6.0000 mg | Freq: Once | SUBCUTANEOUS | Status: AC
  Administered 2013-01-18: 6 mg via SUBCUTANEOUS
  Filled 2013-01-18: qty 0.6

## 2013-01-24 ENCOUNTER — Ambulatory Visit (HOSPITAL_BASED_OUTPATIENT_CLINIC_OR_DEPARTMENT_OTHER): Payer: Medicare Other

## 2013-01-24 ENCOUNTER — Other Ambulatory Visit (HOSPITAL_BASED_OUTPATIENT_CLINIC_OR_DEPARTMENT_OTHER): Payer: Medicare Other | Admitting: Lab

## 2013-01-24 ENCOUNTER — Ambulatory Visit (HOSPITAL_BASED_OUTPATIENT_CLINIC_OR_DEPARTMENT_OTHER): Payer: Medicare Other | Admitting: Physician Assistant

## 2013-01-24 ENCOUNTER — Encounter: Payer: Self-pay | Admitting: Physician Assistant

## 2013-01-24 ENCOUNTER — Telehealth: Payer: Self-pay | Admitting: Oncology

## 2013-01-24 VITALS — BP 112/64 | HR 70 | Temp 98.4°F | Resp 20 | Ht 63.0 in | Wt 322.9 lb

## 2013-01-24 DIAGNOSIS — C50912 Malignant neoplasm of unspecified site of left female breast: Secondary | ICD-10-CM

## 2013-01-24 DIAGNOSIS — C50919 Malignant neoplasm of unspecified site of unspecified female breast: Secondary | ICD-10-CM

## 2013-01-24 DIAGNOSIS — E860 Dehydration: Secondary | ICD-10-CM

## 2013-01-24 DIAGNOSIS — C50212 Malignant neoplasm of upper-inner quadrant of left female breast: Secondary | ICD-10-CM

## 2013-01-24 DIAGNOSIS — C50219 Malignant neoplasm of upper-inner quadrant of unspecified female breast: Secondary | ICD-10-CM

## 2013-01-24 DIAGNOSIS — B370 Candidal stomatitis: Secondary | ICD-10-CM

## 2013-01-24 DIAGNOSIS — I82622 Acute embolism and thrombosis of deep veins of left upper extremity: Secondary | ICD-10-CM

## 2013-01-24 DIAGNOSIS — C7800 Secondary malignant neoplasm of unspecified lung: Secondary | ICD-10-CM

## 2013-01-24 LAB — CBC WITH DIFFERENTIAL/PLATELET
BASO%: 1.1 % (ref 0.0–2.0)
Basophils Absolute: 0.2 10*3/uL — ABNORMAL HIGH (ref 0.0–0.1)
EOS%: 0.8 % (ref 0.0–7.0)
Eosinophils Absolute: 0.2 10*3/uL (ref 0.0–0.5)
HCT: 36.9 % (ref 34.8–46.6)
HGB: 12.2 g/dL (ref 11.6–15.9)
LYMPH%: 16.1 % (ref 14.0–49.7)
MCH: 27.7 pg (ref 25.1–34.0)
MCHC: 33.1 g/dL (ref 31.5–36.0)
MCV: 83.9 fL (ref 79.5–101.0)
MONO#: 2.1 10*3/uL — ABNORMAL HIGH (ref 0.1–0.9)
MONO%: 10.6 % (ref 0.0–14.0)
NEUT#: 14.2 10*3/uL — ABNORMAL HIGH (ref 1.5–6.5)
NEUT%: 71.4 % (ref 38.4–76.8)
Platelets: 292 10*3/uL (ref 145–400)
RBC: 4.4 10*6/uL (ref 3.70–5.45)
RDW: 16.4 % — ABNORMAL HIGH (ref 11.2–14.5)
WBC: 19.9 10*3/uL — ABNORMAL HIGH (ref 3.9–10.3)
lymph#: 3.2 10*3/uL (ref 0.9–3.3)
nRBC: 2 % — ABNORMAL HIGH (ref 0–0)

## 2013-01-24 MED ORDER — SODIUM CHLORIDE 0.9 % IV SOLN
INTRAVENOUS | Status: DC
  Administered 2013-01-24: 13:00:00 via INTRAVENOUS

## 2013-01-24 MED ORDER — SODIUM CHLORIDE 0.9 % IJ SOLN
10.0000 mL | INTRAMUSCULAR | Status: DC | PRN
  Administered 2013-01-24: 10 mL via INTRAVENOUS
  Filled 2013-01-24: qty 10

## 2013-01-24 MED ORDER — HEPARIN SOD (PORK) LOCK FLUSH 100 UNIT/ML IV SOLN
500.0000 [IU] | Freq: Once | INTRAVENOUS | Status: AC
  Administered 2013-01-24: 500 [IU] via INTRAVENOUS
  Filled 2013-01-24: qty 5

## 2013-01-24 MED ORDER — MAGIC MOUTHWASH: 5.0000 mL | Freq: Four times a day (QID) | ORAL | Status: DC

## 2013-01-24 NOTE — Patient Instructions (Addendum)
Dehydration, Adult  Dehydration means your body does not have as much fluid as it needs. Your kidneys, brain, and heart will not work properly without the right amount of fluids and salt.   HOME CARE   Ask your doctor how to replace body fluid losses (rehydrate).   Drink enough fluids to keep your pee (urine) clear or pale yellow.   Drink small amounts of fluids often if you feel sick to your stomach (nauseous) or throw up (vomit).   Eat like you normally do.   Avoid:   Foods or drinks high in sugar.   Bubbly (carbonated) drinks.   Juice.   Very hot or cold fluids.   Drinks with caffeine.   Fatty, greasy foods.   Alcohol.   Tobacco.   Eating too much.   Gelatin desserts.   Wash your hands to avoid spreading germs (bacteria, viruses).   Only take medicine as told by your doctor.   Keep all doctor visits as told.  GET HELP RIGHT AWAY IF:    You cannot drink something without throwing up.   You get worse even with treatment.   Your vomit has blood in it or looks greenish.   Your poop (stool) has blood in it or looks black and tarry.   You have not peed in 6 to 8 hours.   You pee a small amount of very dark pee.   You have a fever.   You pass out (faint).   You have belly (abdominal) pain that gets worse or stays in one spot (localizes).   You have a rash, stiff neck, or bad headache.   You get easily annoyed, sleepy, or are hard to wake up.   You feel weak, dizzy, or very thirsty.  MAKE SURE YOU:    Understand these instructions.   Will watch your condition.   Will get help right away if you are not doing well or get worse.  Document Released: 05/31/2009 Document Revised: 10/27/2011 Document Reviewed: 03/24/2011  [employer] Patient Information 2014 [employer].
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
## 2013-01-25 ENCOUNTER — Telehealth: Payer: Self-pay | Admitting: *Deleted

## 2013-01-25 DIAGNOSIS — C50919 Malignant neoplasm of unspecified site of unspecified female breast: Secondary | ICD-10-CM

## 2013-01-25 MED ORDER — NYSTATIN 100000 UNIT/ML MT SUSP: 500000.0000 [IU] | Freq: Four times a day (QID) | OROMUCOSAL | Status: DC

## 2013-01-25 NOTE — Telephone Encounter (Signed)
MMW not covered for oral thrush per Medicare and patient cost of $35 too high for her to manage. Per Zollie Scale, PA, will change to Nystatin oral suspension, which is covered.

## 2013-01-27 ENCOUNTER — Ambulatory Visit (HOSPITAL_COMMUNITY): Payer: Medicare Other

## 2013-02-01 ENCOUNTER — Other Ambulatory Visit: Payer: Self-pay | Admitting: Physician Assistant

## 2013-02-01 DIAGNOSIS — C50212 Malignant neoplasm of upper-inner quadrant of left female breast: Secondary | ICD-10-CM

## 2013-02-04 ENCOUNTER — Other Ambulatory Visit: Payer: Self-pay | Admitting: *Deleted

## 2013-02-04 DIAGNOSIS — C50919 Malignant neoplasm of unspecified site of unspecified female breast: Secondary | ICD-10-CM

## 2013-02-07 ENCOUNTER — Ambulatory Visit (HOSPITAL_BASED_OUTPATIENT_CLINIC_OR_DEPARTMENT_OTHER): Payer: Medicare Other | Admitting: Physician Assistant

## 2013-02-07 ENCOUNTER — Ambulatory Visit (HOSPITAL_BASED_OUTPATIENT_CLINIC_OR_DEPARTMENT_OTHER): Payer: Medicare Other

## 2013-02-07 ENCOUNTER — Telehealth: Payer: Self-pay | Admitting: *Deleted

## 2013-02-07 ENCOUNTER — Encounter: Payer: Self-pay | Admitting: Physician Assistant

## 2013-02-07 ENCOUNTER — Other Ambulatory Visit (HOSPITAL_BASED_OUTPATIENT_CLINIC_OR_DEPARTMENT_OTHER): Payer: Medicare Other | Admitting: Lab

## 2013-02-07 VITALS — BP 123/77 | HR 73 | Temp 98.7°F | Resp 10 | Ht 63.0 in | Wt 326.3 lb

## 2013-02-07 DIAGNOSIS — I82629 Acute embolism and thrombosis of deep veins of unspecified upper extremity: Secondary | ICD-10-CM

## 2013-02-07 DIAGNOSIS — C7800 Secondary malignant neoplasm of unspecified lung: Secondary | ICD-10-CM

## 2013-02-07 DIAGNOSIS — C50212 Malignant neoplasm of upper-inner quadrant of left female breast: Secondary | ICD-10-CM

## 2013-02-07 DIAGNOSIS — C50912 Malignant neoplasm of unspecified site of left female breast: Secondary | ICD-10-CM

## 2013-02-07 DIAGNOSIS — Z5112 Encounter for antineoplastic immunotherapy: Secondary | ICD-10-CM

## 2013-02-07 DIAGNOSIS — E860 Dehydration: Secondary | ICD-10-CM

## 2013-02-07 DIAGNOSIS — C50219 Malignant neoplasm of upper-inner quadrant of unspecified female breast: Secondary | ICD-10-CM

## 2013-02-07 DIAGNOSIS — H04209 Unspecified epiphora, unspecified lacrimal gland: Secondary | ICD-10-CM

## 2013-02-07 DIAGNOSIS — C50919 Malignant neoplasm of unspecified site of unspecified female breast: Secondary | ICD-10-CM

## 2013-02-07 DIAGNOSIS — R197 Diarrhea, unspecified: Secondary | ICD-10-CM

## 2013-02-07 DIAGNOSIS — F411 Generalized anxiety disorder: Secondary | ICD-10-CM

## 2013-02-07 DIAGNOSIS — C7802 Secondary malignant neoplasm of left lung: Secondary | ICD-10-CM

## 2013-02-07 DIAGNOSIS — Z5111 Encounter for antineoplastic chemotherapy: Secondary | ICD-10-CM

## 2013-02-07 DIAGNOSIS — H04203 Unspecified epiphora, bilateral lacrimal glands: Secondary | ICD-10-CM

## 2013-02-07 LAB — CBC WITH DIFFERENTIAL/PLATELET
BASO%: 0.3 % (ref 0.0–2.0)
Basophils Absolute: 0 10*3/uL (ref 0.0–0.1)
EOS%: 0.3 % (ref 0.0–7.0)
Eosinophils Absolute: 0 10*3/uL (ref 0.0–0.5)
HCT: 36.9 % (ref 34.8–46.6)
HGB: 11.9 g/dL (ref 11.6–15.9)
LYMPH%: 24.3 % (ref 14.0–49.7)
MCH: 27.9 pg (ref 25.1–34.0)
MCHC: 32.2 g/dL (ref 31.5–36.0)
MCV: 86.4 fL (ref 79.5–101.0)
MONO#: 0.7 10*3/uL (ref 0.1–0.9)
MONO%: 9.1 % (ref 0.0–14.0)
NEUT#: 5 10*3/uL (ref 1.5–6.5)
NEUT%: 66 % (ref 38.4–76.8)
Platelets: 305 10*3/uL (ref 145–400)
RBC: 4.27 10*6/uL (ref 3.70–5.45)
RDW: 18.1 % — ABNORMAL HIGH (ref 11.2–14.5)
WBC: 7.5 10*3/uL (ref 3.9–10.3)
lymph#: 1.8 10*3/uL (ref 0.9–3.3)
nRBC: 0 % (ref 0–0)

## 2013-02-07 LAB — COMPREHENSIVE METABOLIC PANEL (CC13)
ALT: 54 U/L (ref 0–55)
AST: 34 U/L (ref 5–34)
Albumin: 3 g/dL — ABNORMAL LOW (ref 3.5–5.0)
Alkaline Phosphatase: 102 U/L (ref 40–150)
BUN: 13.4 mg/dL (ref 7.0–26.0)
CO2: 26 mEq/L (ref 22–29)
Calcium: 9.1 mg/dL (ref 8.4–10.4)
Chloride: 101 mEq/L (ref 98–107)
Creatinine: 0.9 mg/dL (ref 0.6–1.1)
Glucose: 193 mg/dl — ABNORMAL HIGH (ref 70–99)
Potassium: 3.9 mEq/L (ref 3.5–5.1)
Sodium: 140 mEq/L (ref 136–145)
Total Bilirubin: 0.39 mg/dL (ref 0.20–1.20)
Total Protein: 6.9 g/dL (ref 6.4–8.3)

## 2013-02-07 MED ORDER — SODIUM CHLORIDE 0.9 % IJ SOLN
10.0000 mL | INTRAMUSCULAR | Status: DC | PRN
  Administered 2013-02-07: 10 mL
  Filled 2013-02-07: qty 10

## 2013-02-07 MED ORDER — DEXAMETHASONE SODIUM PHOSPHATE 10 MG/ML IJ SOLN
10.0000 mg | Freq: Once | INTRAMUSCULAR | Status: AC
  Administered 2013-02-07: 10 mg via INTRAVENOUS

## 2013-02-07 MED ORDER — DIPHENHYDRAMINE HCL 25 MG PO CAPS
25.0000 mg | ORAL_CAPSULE | Freq: Once | ORAL | Status: AC
  Administered 2013-02-07: 25 mg via ORAL

## 2013-02-07 MED ORDER — SODIUM CHLORIDE 0.9 % IV SOLN
Freq: Once | INTRAVENOUS | Status: AC
  Administered 2013-02-07: 13:00:00 via INTRAVENOUS

## 2013-02-07 MED ORDER — SODIUM CHLORIDE 0.9 % IV SOLN
420.0000 mg | Freq: Once | INTRAVENOUS | Status: AC
  Administered 2013-02-07: 420 mg via INTRAVENOUS
  Filled 2013-02-07: qty 14

## 2013-02-07 MED ORDER — HEPARIN SOD (PORK) LOCK FLUSH 100 UNIT/ML IV SOLN
500.0000 [IU] | Freq: Once | INTRAVENOUS | Status: AC | PRN
  Administered 2013-02-07: 500 [IU]
  Filled 2013-02-07: qty 5

## 2013-02-07 MED ORDER — ACETAMINOPHEN 325 MG PO TABS
650.0000 mg | ORAL_TABLET | Freq: Once | ORAL | Status: AC
  Administered 2013-02-07: 650 mg via ORAL

## 2013-02-07 MED ORDER — ONDANSETRON 8 MG/50ML IVPB (CHCC)
8.0000 mg | Freq: Once | INTRAVENOUS | Status: AC
  Administered 2013-02-07: 8 mg via INTRAVENOUS

## 2013-02-07 MED ORDER — TRASTUZUMAB CHEMO INJECTION 440 MG
6.0000 mg/kg | Freq: Once | INTRAVENOUS | Status: AC
  Administered 2013-02-07: 903 mg via INTRAVENOUS
  Filled 2013-02-07: qty 43

## 2013-02-07 MED ORDER — TOBRAMYCIN-DEXAMETHASONE 0.3-0.1 % OP SUSP: 1.0000 [drp] | Freq: Two times a day (BID) | OPHTHALMIC | Status: DC

## 2013-02-07 MED ORDER — SODIUM CHLORIDE 0.9 % IV SOLN
75.0000 mg/m2 | Freq: Once | INTRAVENOUS | Status: AC
  Administered 2013-02-07: 190 mg via INTRAVENOUS
  Filled 2013-02-07: qty 19

## 2013-02-07 NOTE — Patient Instructions (Signed)
[facility] Discharge Instructions for Patients Receiving Chemotherapy  Today you received the following chemotherapy agents Taxotere/Perjeta/Herceptin   To help prevent nausea and vomiting after your treatment, we encourage you to take your nausea medication as needed   If you develop nausea and vomiting that is not controlled by your nausea medication, call the clinic.   BELOW ARE SYMPTOMS THAT SHOULD BE REPORTED IMMEDIATELY:  *FEVER GREATER THAN 100.5 F  *CHILLS WITH OR WITHOUT FEVER  NAUSEA AND VOMITING THAT IS NOT CONTROLLED WITH YOUR NAUSEA MEDICATION  *UNUSUAL SHORTNESS OF BREATH  *UNUSUAL BRUISING OR BLEEDING  TENDERNESS IN MOUTH AND THROAT WITH OR WITHOUT PRESENCE OF ULCERS  *URINARY PROBLEMS  *BOWEL PROBLEMS  UNUSUAL RASH Items with * indicate a potential emergency and should be followed up as soon as possible.  Feel free to call the clinic you have any questions or concerns. The clinic phone number is [phone].

## 2013-02-07 NOTE — Telephone Encounter (Signed)
Per staff phone call and POF I have schedueld appts.  JMW  
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
## 2013-02-07 NOTE — Telephone Encounter (Signed)
appts made and printed...td 

## 2013-02-08 ENCOUNTER — Ambulatory Visit (HOSPITAL_BASED_OUTPATIENT_CLINIC_OR_DEPARTMENT_OTHER): Payer: Medicare Other

## 2013-02-08 ENCOUNTER — Encounter: Payer: Self-pay | Admitting: Oncology

## 2013-02-08 VITALS — BP 142/71 | HR 87 | Temp 98.7°F

## 2013-02-08 DIAGNOSIS — C7800 Secondary malignant neoplasm of unspecified lung: Secondary | ICD-10-CM

## 2013-02-08 DIAGNOSIS — C50219 Malignant neoplasm of upper-inner quadrant of unspecified female breast: Secondary | ICD-10-CM

## 2013-02-08 DIAGNOSIS — C50212 Malignant neoplasm of upper-inner quadrant of left female breast: Secondary | ICD-10-CM

## 2013-02-08 DIAGNOSIS — Z5189 Encounter for other specified aftercare: Secondary | ICD-10-CM

## 2013-02-08 DIAGNOSIS — E860 Dehydration: Secondary | ICD-10-CM

## 2013-02-08 MED ORDER — SODIUM CHLORIDE 0.9 % IV SOLN
INTRAVENOUS | Status: DC
  Administered 2013-02-08: 12:00:00 via INTRAVENOUS

## 2013-02-08 MED ORDER — PEGFILGRASTIM INJECTION 6 MG/0.6ML
6.0000 mg | Freq: Once | SUBCUTANEOUS | Status: AC
  Administered 2013-02-08: 6 mg via SUBCUTANEOUS
  Filled 2013-02-08: qty 0.6

## 2013-02-08 MED ORDER — HEPARIN SOD (PORK) LOCK FLUSH 100 UNIT/ML IV SOLN
500.0000 [IU] | Freq: Once | INTRAVENOUS | Status: AC
  Administered 2013-02-08: 500 [IU] via INTRAVENOUS
  Filled 2013-02-08: qty 5

## 2013-02-08 MED ORDER — SODIUM CHLORIDE 0.9 % IJ SOLN
10.0000 mL | INTRAMUSCULAR | Status: DC | PRN
  Administered 2013-02-08: 10 mL via INTRAVENOUS
  Filled 2013-02-08: qty 10

## 2013-02-08 NOTE — Patient Instructions (Signed)
Dehydration, Adult °Dehydration is when you lose more fluids from the body than you take in. Vital organs like the kidneys, brain, and heart cannot function without a proper amount of fluids and salt. Any loss of fluids from the body can cause dehydration.  °CAUSES  °· Vomiting. °· Diarrhea. °· Excessive sweating. °· Excessive urine output. °· Fever. °SYMPTOMS  °Mild dehydration °· Thirst. °· Dry lips. °· Slightly dry mouth. °Moderate dehydration °· Very dry mouth. °· Sunken eyes. °· Skin does not bounce back quickly when lightly pinched and released. °· Dark urine and decreased urine production. °· Decreased tear production. °· Headache. °Severe dehydration °· Very dry mouth. °· Extreme thirst. °· Rapid, weak pulse (more than 100 beats per minute at rest). °· Cold hands and feet. °· Not able to sweat in spite of heat and temperature. °· Rapid breathing. °· Blue lips. °· Confusion and lethargy. °· Difficulty being awakened. °· Minimal urine production. °· No tears. °DIAGNOSIS  °Your caregiver will diagnose dehydration based on your symptoms and your exam. Blood and urine tests will help confirm the diagnosis. The diagnostic evaluation should also identify the cause of dehydration. °TREATMENT  °Treatment of mild or moderate dehydration can often be done at home by increasing the amount of fluids that you drink. It is best to drink small amounts of fluid more often. Drinking too much at one time can make vomiting worse. Refer to the home care instructions below. °Severe dehydration needs to be treated at the hospital where you will probably be given intravenous (IV) fluids that contain water and electrolytes. °HOME CARE INSTRUCTIONS  °· Ask your caregiver about specific rehydration instructions. °· Drink enough fluids to keep your urine clear or pale yellow. °· Drink small amounts frequently if you have nausea and vomiting. °· Eat as you normally do. °· Avoid: °· Foods or drinks high in sugar. °· Carbonated  drinks. °· Juice. °· Extremely hot or cold fluids. °· Drinks with caffeine. °· Fatty, greasy foods. °· Alcohol. °· Tobacco. °· Overeating. °· Gelatin desserts. °· Wash your hands well to avoid spreading bacteria and viruses. °· Only take over-the-counter or prescription medicines for pain, discomfort, or fever as directed by your caregiver. °· Ask your caregiver if you should continue all prescribed and over-the-counter medicines. °· Keep all follow-up appointments with your caregiver. °SEEK MEDICAL CARE IF: °· You have abdominal pain and it increases or stays in one area (localizes). °· You have a rash, stiff neck, or severe headache. °· You are irritable, sleepy, or difficult to awaken. °· You are weak, dizzy, or extremely thirsty. °SEEK IMMEDIATE MEDICAL CARE IF:  °· You are unable to keep fluids down or you get worse despite treatment. °· You have frequent episodes of vomiting or diarrhea. °· You have blood or green matter (bile) in your vomit. °· You have blood in your stool or your stool looks black and tarry. °· You have not urinated in 6 to 8 hours, or you have only urinated a small amount of very dark urine. °· You have a fever. °· You faint. °MAKE SURE YOU:  °· Understand these instructions. °· Will watch your condition. °· Will get help right away if you are not doing well or get worse. °Document Released: 08/04/2005 Document Revised: 10/27/2011 Document Reviewed: 03/24/2011 °ExitCare® Patient Information ©2014 [employer]. ° °Diarrhea °Diarrhea is frequent loose and watery bowel movements. It can cause you to feel weak and dehydrated. Dehydration can cause you to become tired and thirsty,   have a dry mouth, and have decreased urination that often is dark yellow. Diarrhea is a sign of another problem, most often an infection that will not last long. In most cases, diarrhea typically lasts 2 3 days. However, it can last longer if it is a sign of something more serious. It is important to treat your  diarrhea as directed by your caregive to lessen or prevent future episodes of diarrhea. °CAUSES  °Some common causes include: °· Gastrointestinal infections caused by viruses, bacteria, or parasites. °· Food poisoning or food allergies. °· Certain medicines, such as antibiotics, chemotherapy, and laxatives. °· Artificial sweeteners and fructose. °· Digestive disorders. °HOME CARE INSTRUCTIONS °· Ensure adequate fluid intake (hydration): have 1 cup (8 oz) of fluid for each diarrhea episode. Avoid fluids that contain simple sugars or sports drinks, fruit juices, whole milk products, and sodas. Your urine should be clear or pale yellow if you are drinking enough fluids. Hydrate with an oral rehydration solution that you can purchase at pharmacies, retail stores, and online. You can prepare an oral rehydration solution at home by mixing the following ingredients together: °·   tsp table salt. °· ¾ tsp baking soda. °·  tsp salt substitute containing potassium chloride. °· 1  tablespoons sugar. °· 1 L (34 oz) of water. °· Certain foods and beverages may increase the speed at which food moves through the gastrointestinal (GI) tract. These foods and beverages should be avoided and include: °· Caffeinated and alcoholic beverages. °· High-fiber foods, such as raw fruits and vegetables, nuts, seeds, and whole grain breads and cereals. °· Foods and beverages sweetened with sugar alcohols, such as xylitol, sorbitol, and mannitol. °· Some foods may be well tolerated and may help thicken stool including: °· Starchy foods, such as rice, toast, pasta, low-sugar cereal, oatmeal, grits, baked potatoes, crackers, and bagels. °· Bananas. °· Applesauce. °· Add probiotic-rich foods to help increase healthy bacteria in the GI tract, such as yogurt and fermented milk products. °· Wash your hands well after each diarrhea episode. °· Only take over-the-counter or prescription medicines as directed by your caregiver. °· Take a warm bath to  relieve any burning or pain from frequent diarrhea episodes. °SEEK IMMEDIATE MEDICAL CARE IF:  °· You are unable to keep fluids down. °· You have persistent vomiting. °· You have blood in your stool, or your stools are black and tarry. °· You do not urinate in 6 8 hours, or there is only a small amount of very dark urine. °· You have abdominal pain that increases or localizes. °· You have weakness, dizziness, confusion, or lightheadedness. °· You have a severe headache. °· Your diarrhea gets worse or does not get better. °· You have a fever or persistent symptoms for more than 2 3 days. °· You have a fever and your symptoms suddenly get worse. °MAKE SURE YOU:  °· Understand these instructions. °· Will watch your condition. °· Will get help right away if you are not doing well or get worse. °Document Released: 07/25/2002 Document Revised: 07/21/2012 Document Reviewed: 04/11/2012 °ExitCare® Patient Information ©2014 [employer]. ° °

## 2013-02-11 ENCOUNTER — Ambulatory Visit (HOSPITAL_COMMUNITY)
Admission: RE | Admit: 2013-02-11 | Discharge: 2013-02-11 | Disposition: A | Discharge status: Home / Self Care | Payer: Medicare Other | Source: Ambulatory Visit | Attending: Physician Assistant | Admitting: Physician Assistant

## 2013-02-11 ENCOUNTER — Encounter (HOSPITAL_COMMUNITY): Payer: Self-pay

## 2013-02-11 DIAGNOSIS — Z9221 Personal history of antineoplastic chemotherapy: Secondary | ICD-10-CM | POA: Insufficient documentation

## 2013-02-11 DIAGNOSIS — C50912 Malignant neoplasm of unspecified site of left female breast: Secondary | ICD-10-CM

## 2013-02-11 DIAGNOSIS — C50212 Malignant neoplasm of upper-inner quadrant of left female breast: Secondary | ICD-10-CM

## 2013-02-11 DIAGNOSIS — C50219 Malignant neoplasm of upper-inner quadrant of unspecified female breast: Secondary | ICD-10-CM | POA: Insufficient documentation

## 2013-02-11 DIAGNOSIS — K7689 Other specified diseases of liver: Secondary | ICD-10-CM | POA: Insufficient documentation

## 2013-02-11 DIAGNOSIS — R918 Other nonspecific abnormal finding of lung field: Secondary | ICD-10-CM | POA: Insufficient documentation

## 2013-02-11 DIAGNOSIS — C7802 Secondary malignant neoplasm of left lung: Secondary | ICD-10-CM

## 2013-02-11 MED ORDER — IOHEXOL 300 MG/ML  SOLN
80.0000 mL | Freq: Once | INTRAMUSCULAR | Status: AC | PRN
  Administered 2013-02-11: 80 mL via INTRAVENOUS

## 2013-02-14 ENCOUNTER — Other Ambulatory Visit (HOSPITAL_BASED_OUTPATIENT_CLINIC_OR_DEPARTMENT_OTHER): Payer: Medicare Other

## 2013-02-14 ENCOUNTER — Telehealth: Payer: Self-pay | Admitting: *Deleted

## 2013-02-14 ENCOUNTER — Ambulatory Visit (HOSPITAL_BASED_OUTPATIENT_CLINIC_OR_DEPARTMENT_OTHER): Payer: Medicare Other | Admitting: Oncology

## 2013-02-14 VITALS — BP 127/78 | HR 78 | Temp 99.5°F | Resp 20 | Ht 63.0 in | Wt 322.2 lb

## 2013-02-14 DIAGNOSIS — C50919 Malignant neoplasm of unspecified site of unspecified female breast: Secondary | ICD-10-CM

## 2013-02-14 DIAGNOSIS — C7800 Secondary malignant neoplasm of unspecified lung: Secondary | ICD-10-CM

## 2013-02-14 DIAGNOSIS — C50212 Malignant neoplasm of upper-inner quadrant of left female breast: Secondary | ICD-10-CM

## 2013-02-14 DIAGNOSIS — C50219 Malignant neoplasm of upper-inner quadrant of unspecified female breast: Secondary | ICD-10-CM

## 2013-02-14 DIAGNOSIS — C50912 Malignant neoplasm of unspecified site of left female breast: Secondary | ICD-10-CM

## 2013-02-14 LAB — COMPREHENSIVE METABOLIC PANEL (CC13)
ALT: 55 U/L (ref 0–55)
AST: 33 U/L (ref 5–34)
Albumin: 2.9 g/dL — ABNORMAL LOW (ref 3.5–5.0)
Alkaline Phosphatase: 141 U/L (ref 40–150)
BUN: 11.2 mg/dL (ref 7.0–26.0)
CO2: 30 mEq/L — ABNORMAL HIGH (ref 22–29)
Calcium: 9.7 mg/dL (ref 8.4–10.4)
Chloride: 103 mEq/L (ref 98–109)
Creatinine: 0.8 mg/dL (ref 0.6–1.1)
Glucose: 127 mg/dl (ref 70–140)
Potassium: 3.8 mEq/L (ref 3.5–5.1)
Sodium: 140 mEq/L (ref 136–145)
Total Bilirubin: 0.26 mg/dL (ref 0.20–1.20)
Total Protein: 6.7 g/dL (ref 6.4–8.3)

## 2013-02-14 LAB — CBC WITH DIFFERENTIAL/PLATELET
BASO%: 1.1 % (ref 0.0–2.0)
Basophils Absolute: 0.2 10*3/uL — ABNORMAL HIGH (ref 0.0–0.1)
EOS%: 0.9 % (ref 0.0–7.0)
Eosinophils Absolute: 0.2 10*3/uL (ref 0.0–0.5)
HCT: 34.7 % — ABNORMAL LOW (ref 34.8–46.6)
HGB: 11.6 g/dL (ref 11.6–15.9)
LYMPH%: 15.7 % (ref 14.0–49.7)
MCH: 28 pg (ref 25.1–34.0)
MCHC: 33.5 g/dL (ref 31.5–36.0)
MCV: 83.7 fL (ref 79.5–101.0)
MONO#: 1.6 10*3/uL — ABNORMAL HIGH (ref 0.1–0.9)
MONO%: 9 % (ref 0.0–14.0)
NEUT#: 13.3 10*3/uL — ABNORMAL HIGH (ref 1.5–6.5)
NEUT%: 73.3 % (ref 38.4–76.8)
Platelets: 269 10*3/uL (ref 145–400)
RBC: 4.15 10*6/uL (ref 3.70–5.45)
RDW: 18.6 % — ABNORMAL HIGH (ref 11.2–14.5)
WBC: 18.1 10*3/uL — ABNORMAL HIGH (ref 3.9–10.3)
lymph#: 2.8 10*3/uL (ref 0.9–3.3)

## 2013-02-14 MED ORDER — ANASTROZOLE 1 MG PO TABS: 1.0000 mg | ORAL_TABLET | Freq: Every day | ORAL | Status: DC
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
## 2013-02-14 NOTE — Telephone Encounter (Signed)
appts made and printed. Pt is aware that her tx will be added at a later time. i emailed MW to add her tx...td

## 2013-02-15 ENCOUNTER — Telehealth: Payer: Self-pay | Admitting: *Deleted

## 2013-02-15 ENCOUNTER — Other Ambulatory Visit: Payer: Self-pay | Admitting: *Deleted

## 2013-02-15 NOTE — Telephone Encounter (Signed)
Per staff phone call and POF I have schedueld appts.  JMW  

## 2013-02-22 ENCOUNTER — Other Ambulatory Visit: Payer: Self-pay | Admitting: Oncology

## 2013-02-22 ENCOUNTER — Ambulatory Visit (HOSPITAL_COMMUNITY)
Admission: RE | Admit: 2013-02-22 | Discharge: 2013-02-22 | Disposition: A | Discharge status: Home / Self Care | Payer: Medicare Other | Source: Ambulatory Visit | Attending: Internal Medicine | Admitting: Internal Medicine

## 2013-02-22 ENCOUNTER — Ambulatory Visit (HOSPITAL_BASED_OUTPATIENT_CLINIC_OR_DEPARTMENT_OTHER)
Admission: RE | Admit: 2013-02-22 | Discharge: 2013-02-22 | Disposition: A | Discharge status: Home / Self Care | Payer: Medicare Other | Source: Ambulatory Visit | Attending: Internal Medicine | Admitting: Internal Medicine

## 2013-02-22 VITALS — BP 150/78 | HR 85 | Wt 323.0 lb

## 2013-02-22 DIAGNOSIS — R0609 Other forms of dyspnea: Secondary | ICD-10-CM | POA: Insufficient documentation

## 2013-02-22 DIAGNOSIS — I10 Essential (primary) hypertension: Secondary | ICD-10-CM | POA: Insufficient documentation

## 2013-02-22 DIAGNOSIS — C50219 Malignant neoplasm of upper-inner quadrant of unspecified female breast: Secondary | ICD-10-CM

## 2013-02-22 DIAGNOSIS — R55 Syncope and collapse: Secondary | ICD-10-CM | POA: Insufficient documentation

## 2013-02-22 DIAGNOSIS — C7800 Secondary malignant neoplasm of unspecified lung: Secondary | ICD-10-CM

## 2013-02-22 DIAGNOSIS — C50912 Malignant neoplasm of unspecified site of left female breast: Secondary | ICD-10-CM

## 2013-02-22 DIAGNOSIS — R0989 Other specified symptoms and signs involving the circulatory and respiratory systems: Secondary | ICD-10-CM | POA: Insufficient documentation

## 2013-02-22 DIAGNOSIS — C50919 Malignant neoplasm of unspecified site of unspecified female breast: Secondary | ICD-10-CM | POA: Insufficient documentation

## 2013-02-22 DIAGNOSIS — Z09 Encounter for follow-up examination after completed treatment for conditions other than malignant neoplasm: Secondary | ICD-10-CM

## 2013-02-22 DIAGNOSIS — E669 Obesity, unspecified: Secondary | ICD-10-CM | POA: Insufficient documentation

## 2013-02-22 DIAGNOSIS — C7802 Secondary malignant neoplasm of left lung: Secondary | ICD-10-CM

## 2013-02-22 DIAGNOSIS — Z87891 Personal history of nicotine dependence: Secondary | ICD-10-CM | POA: Insufficient documentation

## 2013-02-22 DIAGNOSIS — C50212 Malignant neoplasm of upper-inner quadrant of left female breast: Secondary | ICD-10-CM

## 2013-02-22 NOTE — Progress Notes (Signed)
Patient ID: CAMBREA KIRT, female   DOB: 05/07/1951, 62 y.o.   MRN: [id number] PCP; Oncologist: Dr Darnelle Catalan General Surgeon: Dr Magnus Ivan Cardiologist: Dr Antoine Poche   HPI: Mrs Wallenstein is referred to cardio-onc clinic by Dr Darnelle Catalan.   Brent is a 62 year old woman with a history of  L breast cancer with metastatic lung disease 11/05/2012. Clinical T1c NX M1, stage IV invasive ductal carcinoma, grade 3, estrogen and progesterone receptor negative, with an MIB-1 of 77%, and HER-2 + Remainder of medical hx notable for HTN, obesity, sleep apnea had sleep study a year ago but does not need CPAP, depression with suicide attempt 1996, left ulnar vein DVT documented 10/27/2012, on Xarelto. She had a cardiac cath in 2007 for CP which showed normal coronaries.   She has completed docetaxel / trastuzumab/ pertuzumab x4 on 02/07/2013.Plan to continue on  trastuzumab/ pertuzumab continued every 3 weeks.   ECHO  10/27/12 EF 60-65% 02/22/13 EF 60% lateral s' 11.0  Complains of fatigue and nausea. She has not had her medications today. Says she has had low blood pressure at home.Mild DOE. No edema, orthopnea or PND.   Review of Systems:     Cardiac Review of Systems: {Y] = yes [ ]  = no  Chest Pain [    ]  Resting SOB [   ] Exertional SOB  [ Y ]  Orthopnea [  ]   Pedal Edema [   ]    Palpitations [  ] Syncope  [  ]   Presyncope [   ]  General Review of Systems: [Y] = yes [  ]=no Constitional: recent weight change [  ]; anorexia [  ]; fatigue [Y  ]; nausea [ Y ]; night sweats [  ]; fever [  ]; or chills [  ];                                                                                                                                          Dental: poor dentition[  ]; Last Dentist visit:   Eye : blurred vision [  ]; diplopia [   ]; vision changes [  ];  Amaurosis fugax[  ]; Resp: cough [  ];  wheezing[  ];  hemoptysis[  ]; shortness of breath[ Y ]; paroxysmal nocturnal dyspnea[  ]; dyspnea on exertion[ Y ]; or  orthopnea[  ];  GI:  gallstones[  ], vomiting[  ];  dysphagia[  ]; melena[  ];  hematochezia [  ]; heartburn[  ];   GU: kidney stones [  ]; hematuria[  ];   dysuria [  ];  nocturia[  ];  history of     obstruction [  ];                 Skin: rash, swelling[  ];, hair loss[  ];  peripheral edema[  ];  or itching[  ]; Musculosketetal: myalgias[  ];  joint swelling[  ];  joint erythema[  ];  joint pain[ Y ];  back pain[  ];  Heme/Lymph: bruising[  ];  bleeding[  ];  anemia[  ];  Neuro: TIA[  ];  headaches[  ];  stroke[  ];  vertigo[  ];  seizures[  ];   paresthesias[  ];  difficulty walking[  ];  Psych:depression[  ]; anxiety[  ];  Endocrine: diabetes[  ];  thyroid dysfunction[  ];  Immunizations: Flu [  ]; Pneumococcal[  ];  Other:    Past Medical History  Diagnosis Date  . Hypertension   . Other abnormal glucose   . Obesity, unspecified   . Unspecified sleep apnea   . Syncope and collapse   . Chest pain   . Suicide attempt 1996  . Fatty liver 6/03  . Lung disease   . Arthritis   . Back pain   . Breast cancer dx'd 11/2012    left    Current Outpatient Prescriptions  Medication Sig Dispense Refill  . albuterol (PROVENTIL HFA;VENTOLIN HFA) 108 (90 BASE) MCG/ACT inhaler Inhale 2 puffs into the lungs every 6 (six) hours as needed for wheezing.  8.5 g  1  . Alum & Mag Hydroxide-Simeth (MAGIC MOUTHWASH) SOLN Take 5 mLs by mouth 4 (four) times daily. X 7 days  300 mL  0  . amLODipine (NORVASC) 5 MG tablet TAKE 1 TABLET (5 MG TOTAL) BY MOUTH DAILY.  30 tablet  9  . anastrozole (ARIMIDEX) 1 MG tablet Take 1 tablet (1 mg total) by mouth daily.  90 tablet  12  . aspirin EC 81 MG EC tablet Take 1 tablet (81 mg total) by mouth daily.  30 tablet  3  . cholestyramine (QUESTRAN) 4 G packet Take 1 packet by mouth 2 (two) times daily with a meal.  30 each  1  . fluconazole (DIFLUCAN) 100 MG tablet Take 1 tablet (100 mg total) by mouth daily.  30 tablet  3  . lidocaine-prilocaine (EMLA) cream Apply  topically as needed.  30 g  1  . LORazepam (ATIVAN) 0.5 MG tablet TAKE 1/2 TO 1 TABLET BY MOUTH UP TO TWICE DAILY FOR ANXIETY AND OR INSOMNIA  15 tablet  0  . losartan-hydrochlorothiazide (HYZAAR) 100-12.5 MG per tablet Take 1 tablet by mouth daily.      Marland Kitchen nystatin (MYCOSTATIN) 100000 UNIT/ML suspension Take 5 mLs (500,000 Units total) by mouth 4 (four) times daily. Swish in mouth and spit or swallow  240 mL  1  . nystatin (MYCOSTATIN) powder Apply topically 3 (three) times daily.  60 g  2  . omeprazole (PRILOSEC) 40 MG capsule Take 1 capsule (40 mg total) by mouth daily.  30 capsule  1  . Rivaroxaban (XARELTO) 20 MG TABS Take 1 tablet (20 mg total) by mouth daily. Patient needs to start 20 mg daily on 11/19/12.  30 tablet  3  . carvedilol (COREG) 12.5 MG tablet Take 1 tablet (12.5 mg total) by mouth 2 (two) times daily with a meal.  60 tablet  11   No current facility-administered medications for this encounter.     Allergies  Allergen Reactions  . Penicillins Anaphylaxis  . Amoxicillin     REACTION: unspecified  . Aspirin Nausea And Vomiting    REACTION: unspecified  . Meperidine Hcl     REACTION: unspecified  . Percocet (Oxycodone-Acetaminophen)     History   Social  History  . Marital Status: Divorced    Spouse Name: N/A    Number of Children: 4  . Years of Education: some colle   Occupational History  . Disability   . retired-daycare    Social History Main Topics  . Smoking status: Former Smoker    Quit date: 01/01/1992  . Smokeless tobacco: Never Used  . Alcohol Use: No  . Drug Use: No  . Sexually Active: Not Currently   Other Topics Concern  . Not on file   Social History Narrative   Health Care POA:    Emergency Contact: Amador Cunas [phone]   End of Life Plan:    Who lives with you: two grandchildren, friend, daughter   Any pets: 3 poodles   Diet: Pt has a variety of protein, starch, vegetables.  Pt is currently working on cutting back on portions for  weight loss.   Exercise: Pt has not regular exercise routine.  Occasionally walks around home.   Seatbelts: Pt reports wearing seatbelt occasionally.   Sun Exposure/Protection: Pt does not use sun protection   Hobbies: reading, playing on kindle, [employer]             Family History  Problem Relation Age of Onset  . Coronary artery disease Father 15  . Diabetes Father   . Heart disease Father   . Breast cancer Mother 45  . Cancer Mother 64    breast  . Coronary artery disease Sister 72  . Coronary artery disease Brother 51  . Cancer Maternal Aunt 40    ovarian  . Cancer Maternal Grandmother 55    ovarian  . Cancer Paternal Aunt 36    ovarian/breast/breast    PHYSICAL EXAM: Filed Vitals:   02/22/13 1146  BP: 150/78  Pulse: 85   General:  Chronically ill appearing. No respiratory difficulty HEENT: normal Neck: supple. no JVD. Carotids 2+ bilat; no bruits. No lymphadenopathy or thryomegaly appreciated. Cor: PMI nondisplaced. Regular rate & rhythm. No rubs, gallops or murmurs. Lungs: clear Abdomen: obese, soft, nontender, nondistended. No hepatosplenomegaly. No bruits or masses. Good bowel sounds. Extremities: no cyanosis, clubbing, rash, edema Neuro: alert & oriented x 3, cranial nerves grossly intact. moves all 4 extremities w/o difficulty. Affect pleasant.    No results found for this or any previous visit (from the past 24 hour(s)). No results found.   ASSESSMENT & PLAN:

## 2013-02-22 NOTE — Assessment & Plan Note (Addendum)
Explained the purpose of the cardio-onc clinic. Discussed  that there is ~10% chance of cardio-toxicity with Herceptin. Dr Gala Romney discussed and reviewed ECHO. Follow up in 3 months with an ECHO.   Patient seen and examined with Tonye Becket, NP. We discussed all aspects of the encounter. I agree with the assessment and plan as stated above. Explained incidence of Herceptin cardiotoxicity and role of Cardio-oncology clinic at length. Echo images reviewed personally. All parameters stable. Reviewed signs and symptoms of HF to look for. Continue Herceptin. Follow-up with echo in 3 months.

## 2013-02-22 NOTE — Patient Instructions (Addendum)
Follow up in 3 months with an ECHO 

## 2013-02-22 NOTE — Progress Notes (Signed)
  Echocardiogram 2D Echocardiogram limited has been performed.  [name] FRANCES 02/22/2013, 12:23 PM

## 2013-02-28 ENCOUNTER — Other Ambulatory Visit (HOSPITAL_BASED_OUTPATIENT_CLINIC_OR_DEPARTMENT_OTHER): Payer: Medicare Other | Admitting: Lab

## 2013-02-28 ENCOUNTER — Ambulatory Visit (HOSPITAL_BASED_OUTPATIENT_CLINIC_OR_DEPARTMENT_OTHER): Payer: Medicare Other

## 2013-02-28 VITALS — BP 117/69 | HR 66 | Temp 97.6°F

## 2013-02-28 DIAGNOSIS — C7802 Secondary malignant neoplasm of left lung: Secondary | ICD-10-CM

## 2013-02-28 DIAGNOSIS — C50912 Malignant neoplasm of unspecified site of left female breast: Secondary | ICD-10-CM

## 2013-02-28 DIAGNOSIS — Z5112 Encounter for antineoplastic immunotherapy: Secondary | ICD-10-CM

## 2013-02-28 DIAGNOSIS — C50219 Malignant neoplasm of upper-inner quadrant of unspecified female breast: Secondary | ICD-10-CM

## 2013-02-28 DIAGNOSIS — C50212 Malignant neoplasm of upper-inner quadrant of left female breast: Secondary | ICD-10-CM

## 2013-02-28 LAB — COMPREHENSIVE METABOLIC PANEL (CC13)
ALT: 70 U/L — ABNORMAL HIGH (ref 0–55)
AST: 46 U/L — ABNORMAL HIGH (ref 5–34)
Albumin: 2.9 g/dL — ABNORMAL LOW (ref 3.5–5.0)
Alkaline Phosphatase: 109 U/L (ref 40–150)
BUN: 17.7 mg/dL (ref 7.0–26.0)
CO2: 26 mEq/L (ref 22–29)
Calcium: 9.2 mg/dL (ref 8.4–10.4)
Chloride: 104 mEq/L (ref 98–109)
Creatinine: 0.8 mg/dL (ref 0.6–1.1)
Glucose: 211 mg/dl — ABNORMAL HIGH (ref 70–140)
Potassium: 3.9 mEq/L (ref 3.5–5.1)
Sodium: 139 mEq/L (ref 136–145)
Total Bilirubin: 0.34 mg/dL (ref 0.20–1.20)
Total Protein: 6.4 g/dL (ref 6.4–8.3)

## 2013-02-28 LAB — CBC WITH DIFFERENTIAL/PLATELET
BASO%: 0.3 % (ref 0.0–2.0)
Basophils Absolute: 0 10*3/uL (ref 0.0–0.1)
EOS%: 0.3 % (ref 0.0–7.0)
Eosinophils Absolute: 0 10*3/uL (ref 0.0–0.5)
HCT: 36.9 % (ref 34.8–46.6)
HGB: 11.5 g/dL — ABNORMAL LOW (ref 11.6–15.9)
LYMPH%: 25.5 % (ref 14.0–49.7)
MCH: 27.6 pg (ref 25.1–34.0)
MCHC: 31.2 g/dL — ABNORMAL LOW (ref 31.5–36.0)
MCV: 88.7 fL (ref 79.5–101.0)
MONO#: 0.7 10*3/uL (ref 0.1–0.9)
MONO%: 11.4 % (ref 0.0–14.0)
NEUT#: 3.9 10*3/uL (ref 1.5–6.5)
NEUT%: 62.5 % (ref 38.4–76.8)
Platelets: 302 10*3/uL (ref 145–400)
RBC: 4.16 10*6/uL (ref 3.70–5.45)
RDW: 19.2 % — ABNORMAL HIGH (ref 11.2–14.5)
WBC: 6.3 10*3/uL (ref 3.9–10.3)
lymph#: 1.6 10*3/uL (ref 0.9–3.3)

## 2013-02-28 MED ORDER — HEPARIN SOD (PORK) LOCK FLUSH 100 UNIT/ML IV SOLN
500.0000 [IU] | Freq: Once | INTRAVENOUS | Status: AC | PRN
  Administered 2013-02-28: 500 [IU]
  Filled 2013-02-28: qty 5

## 2013-02-28 MED ORDER — SODIUM CHLORIDE 0.9 % IJ SOLN
10.0000 mL | INTRAMUSCULAR | Status: DC | PRN
  Administered 2013-02-28: 10 mL
  Filled 2013-02-28: qty 10

## 2013-02-28 MED ORDER — SODIUM CHLORIDE 0.9 % IV SOLN
420.0000 mg | Freq: Once | INTRAVENOUS | Status: AC
  Administered 2013-02-28: 420 mg via INTRAVENOUS
  Filled 2013-02-28: qty 14

## 2013-02-28 MED ORDER — ACETAMINOPHEN 325 MG PO TABS
650.0000 mg | ORAL_TABLET | Freq: Once | ORAL | Status: AC
  Administered 2013-02-28: 650 mg via ORAL

## 2013-02-28 MED ORDER — TRASTUZUMAB CHEMO INJECTION 440 MG
6.0000 mg/kg | Freq: Once | INTRAVENOUS | Status: AC
  Administered 2013-02-28: 903 mg via INTRAVENOUS
  Filled 2013-02-28: qty 43

## 2013-02-28 MED ORDER — DIPHENHYDRAMINE HCL 25 MG PO CAPS
25.0000 mg | ORAL_CAPSULE | Freq: Once | ORAL | Status: AC
  Administered 2013-02-28: 25 mg via ORAL

## 2013-02-28 MED ORDER — SODIUM CHLORIDE 0.9 % IV SOLN
Freq: Once | INTRAVENOUS | Status: AC
  Administered 2013-02-28: 09:00:00 via INTRAVENOUS

## 2013-02-28 NOTE — Patient Instructions (Addendum)
[facility] Discharge Instructions for Patients Receiving Chemotherapy  Today you received the following chemotherapy agents Perjeta/Herceptin.  To help prevent nausea and vomiting after your treatment, we encourage you to take your nausea medication if needed.   If you develop nausea and vomiting that is not controlled by your nausea medication, call the clinic.   BELOW ARE SYMPTOMS THAT SHOULD BE REPORTED IMMEDIATELY:  *FEVER GREATER THAN 100.5 F  *CHILLS WITH OR WITHOUT FEVER  NAUSEA AND VOMITING THAT IS NOT CONTROLLED WITH YOUR NAUSEA MEDICATION  *UNUSUAL SHORTNESS OF BREATH  *UNUSUAL BRUISING OR BLEEDING  TENDERNESS IN MOUTH AND THROAT WITH OR WITHOUT PRESENCE OF ULCERS  *URINARY PROBLEMS  *BOWEL PROBLEMS  UNUSUAL RASH Items with * indicate a potential emergency and should be followed up as soon as possible.  Feel free to call the clinic you have any questions or concerns. The clinic phone number is [phone].

## 2013-02-28 NOTE — Progress Notes (Signed)
First set of vitals signs noted, patient asymptomatic of hypotension. Utilized different machine with more accurate reading. Patient held for post-Perjeta for observation.

## 2013-03-02 ENCOUNTER — Other Ambulatory Visit (HOSPITAL_COMMUNITY): Payer: Self-pay | Admitting: Family Medicine

## 2013-03-02 ENCOUNTER — Other Ambulatory Visit: Payer: Self-pay | Admitting: Physician Assistant

## 2013-03-02 ENCOUNTER — Other Ambulatory Visit: Payer: Self-pay | Admitting: Oncology

## 2013-03-02 ENCOUNTER — Other Ambulatory Visit: Payer: Self-pay | Admitting: Cardiology

## 2013-03-02 DIAGNOSIS — C50919 Malignant neoplasm of unspecified site of unspecified female breast: Secondary | ICD-10-CM

## 2013-03-02 NOTE — Telephone Encounter (Signed)
Refill approved per Zollie Scale, PA

## 2013-03-07 ENCOUNTER — Encounter: Payer: Self-pay | Admitting: Home Health Services

## 2013-03-15 ENCOUNTER — Other Ambulatory Visit: Payer: Medicare Other | Admitting: Lab

## 2013-03-15 ENCOUNTER — Ambulatory Visit: Payer: Medicare Other | Admitting: Oncology

## 2013-03-15 ENCOUNTER — Telehealth: Payer: Self-pay | Admitting: *Deleted

## 2013-03-15 NOTE — Telephone Encounter (Signed)
Pt called to cancel her appts for 03/15/13...td

## 2013-03-21 ENCOUNTER — Ambulatory Visit (HOSPITAL_BASED_OUTPATIENT_CLINIC_OR_DEPARTMENT_OTHER): Payer: Medicare Other

## 2013-03-21 ENCOUNTER — Other Ambulatory Visit (HOSPITAL_BASED_OUTPATIENT_CLINIC_OR_DEPARTMENT_OTHER): Payer: Medicare Other

## 2013-03-21 VITALS — BP 142/72 | HR 78 | Temp 98.3°F

## 2013-03-21 DIAGNOSIS — C50212 Malignant neoplasm of upper-inner quadrant of left female breast: Secondary | ICD-10-CM

## 2013-03-21 DIAGNOSIS — C50219 Malignant neoplasm of upper-inner quadrant of unspecified female breast: Secondary | ICD-10-CM

## 2013-03-21 DIAGNOSIS — Z5112 Encounter for antineoplastic immunotherapy: Secondary | ICD-10-CM

## 2013-03-21 DIAGNOSIS — C50912 Malignant neoplasm of unspecified site of left female breast: Secondary | ICD-10-CM

## 2013-03-21 LAB — COMPREHENSIVE METABOLIC PANEL (CC13)
ALT: 52 U/L (ref 0–55)
AST: 31 U/L (ref 5–34)
Albumin: 3.1 g/dL — ABNORMAL LOW (ref 3.5–5.0)
Alkaline Phosphatase: 97 U/L (ref 40–150)
BUN: 16.3 mg/dL (ref 7.0–26.0)
CO2: 30 mEq/L — ABNORMAL HIGH (ref 22–29)
Calcium: 9.7 mg/dL (ref 8.4–10.4)
Chloride: 102 mEq/L (ref 98–109)
Creatinine: 0.8 mg/dL (ref 0.6–1.1)
Glucose: 151 mg/dl — ABNORMAL HIGH (ref 70–140)
Potassium: 4.1 mEq/L (ref 3.5–5.1)
Sodium: 142 mEq/L (ref 136–145)
Total Bilirubin: 0.31 mg/dL (ref 0.20–1.20)
Total Protein: 7.2 g/dL (ref 6.4–8.3)

## 2013-03-21 LAB — CBC WITH DIFFERENTIAL/PLATELET
BASO%: 0.5 % (ref 0.0–2.0)
Basophils Absolute: 0 10*3/uL (ref 0.0–0.1)
EOS%: 3 % (ref 0.0–7.0)
Eosinophils Absolute: 0.2 10*3/uL (ref 0.0–0.5)
HCT: 40.4 % (ref 34.8–46.6)
HGB: 12.9 g/dL (ref 11.6–15.9)
LYMPH%: 34.7 % (ref 14.0–49.7)
MCH: 27.8 pg (ref 25.1–34.0)
MCHC: 31.9 g/dL (ref 31.5–36.0)
MCV: 87.1 fL (ref 79.5–101.0)
MONO#: 0.4 10*3/uL (ref 0.1–0.9)
MONO%: 7.4 % (ref 0.0–14.0)
NEUT#: 3.3 10*3/uL (ref 1.5–6.5)
NEUT%: 54.4 % (ref 38.4–76.8)
Platelets: 234 10*3/uL (ref 145–400)
RBC: 4.64 10*6/uL (ref 3.70–5.45)
RDW: 15.8 % — ABNORMAL HIGH (ref 11.2–14.5)
WBC: 6 10*3/uL (ref 3.9–10.3)
lymph#: 2.1 10*3/uL (ref 0.9–3.3)
nRBC: 0 % (ref 0–0)

## 2013-03-21 MED ORDER — SODIUM CHLORIDE 0.9 % IV SOLN
420.0000 mg | Freq: Once | INTRAVENOUS | Status: AC
  Administered 2013-03-21: 420 mg via INTRAVENOUS
  Filled 2013-03-21: qty 14

## 2013-03-21 MED ORDER — DIPHENHYDRAMINE HCL 25 MG PO CAPS
25.0000 mg | ORAL_CAPSULE | Freq: Once | ORAL | Status: AC
  Administered 2013-03-21: 25 mg via ORAL

## 2013-03-21 MED ORDER — SODIUM CHLORIDE 0.9 % IV SOLN
6.0000 mg/kg | Freq: Once | INTRAVENOUS | Status: AC
  Administered 2013-03-21: 903 mg via INTRAVENOUS
  Filled 2013-03-21: qty 43

## 2013-03-21 MED ORDER — ACETAMINOPHEN 325 MG PO TABS
650.0000 mg | ORAL_TABLET | Freq: Once | ORAL | Status: AC
  Administered 2013-03-21: 650 mg via ORAL

## 2013-03-21 MED ORDER — HEPARIN SOD (PORK) LOCK FLUSH 100 UNIT/ML IV SOLN
500.0000 [IU] | Freq: Once | INTRAVENOUS | Status: AC | PRN
  Administered 2013-03-21: 500 [IU]
  Filled 2013-03-21: qty 5

## 2013-03-21 MED ORDER — SODIUM CHLORIDE 0.9 % IJ SOLN
10.0000 mL | INTRAMUSCULAR | Status: DC | PRN
  Administered 2013-03-21: 10 mL
  Filled 2013-03-21: qty 10

## 2013-03-21 MED ORDER — SODIUM CHLORIDE 0.9 % IV SOLN
Freq: Once | INTRAVENOUS | Status: AC
  Administered 2013-03-21: 09:00:00 via INTRAVENOUS

## 2013-03-21 NOTE — Patient Instructions (Addendum)
[facility]cer Center Discharge Instructions for Patients Receiving Chemotherapy  Today you received the following chemotherapy agents Herceptin/Perjeta.  To help prevent nausea and vomiting after your treatment, we encourage you to take your nausea medication as prescribed.   If you develop nausea and vomiting that is not controlled by your nausea medication, call the clinic.   BELOW ARE SYMPTOMS THAT SHOULD BE REPORTED IMMEDIATELY:  *FEVER GREATER THAN 100.5 F  *CHILLS WITH OR WITHOUT FEVER  NAUSEA AND VOMITING THAT IS NOT CONTROLLED WITH YOUR NAUSEA MEDICATION  *UNUSUAL SHORTNESS OF BREATH  *UNUSUAL BRUISING OR BLEEDING  TENDERNESS IN MOUTH AND THROAT WITH OR WITHOUT PRESENCE OF ULCERS  *URINARY PROBLEMS  *BOWEL PROBLEMS  UNUSUAL RASH Items with * indicate a potential emergency and should be followed up as soon as possible.  Feel free to call the clinic you have any questions or concerns. The clinic phone number is [phone].    

## 2013-03-31 ENCOUNTER — Other Ambulatory Visit (HOSPITAL_COMMUNITY): Payer: Self-pay | Admitting: Family Medicine

## 2013-03-31 ENCOUNTER — Other Ambulatory Visit: Payer: Self-pay | Admitting: Physician Assistant

## 2013-03-31 ENCOUNTER — Other Ambulatory Visit: Payer: Self-pay | Admitting: *Deleted

## 2013-04-11 ENCOUNTER — Encounter: Payer: Self-pay | Admitting: Physician Assistant

## 2013-04-11 ENCOUNTER — Ambulatory Visit (HOSPITAL_BASED_OUTPATIENT_CLINIC_OR_DEPARTMENT_OTHER): Payer: Medicare Other | Admitting: Physician Assistant

## 2013-04-11 ENCOUNTER — Other Ambulatory Visit (HOSPITAL_BASED_OUTPATIENT_CLINIC_OR_DEPARTMENT_OTHER): Payer: Medicare Other | Admitting: Lab

## 2013-04-11 ENCOUNTER — Ambulatory Visit (HOSPITAL_BASED_OUTPATIENT_CLINIC_OR_DEPARTMENT_OTHER): Payer: Medicare Other

## 2013-04-11 ENCOUNTER — Encounter: Payer: Self-pay | Admitting: *Deleted

## 2013-04-11 VITALS — BP 164/83 | HR 71 | Temp 98.6°F | Resp 20 | Ht 63.0 in | Wt 325.0 lb

## 2013-04-11 DIAGNOSIS — Z5112 Encounter for antineoplastic immunotherapy: Secondary | ICD-10-CM

## 2013-04-11 DIAGNOSIS — K219 Gastro-esophageal reflux disease without esophagitis: Secondary | ICD-10-CM

## 2013-04-11 DIAGNOSIS — C50219 Malignant neoplasm of upper-inner quadrant of unspecified female breast: Secondary | ICD-10-CM

## 2013-04-11 DIAGNOSIS — F411 Generalized anxiety disorder: Secondary | ICD-10-CM

## 2013-04-11 DIAGNOSIS — C50212 Malignant neoplasm of upper-inner quadrant of left female breast: Secondary | ICD-10-CM

## 2013-04-11 DIAGNOSIS — L0390 Cellulitis, unspecified: Secondary | ICD-10-CM

## 2013-04-11 DIAGNOSIS — C50912 Malignant neoplasm of unspecified site of left female breast: Secondary | ICD-10-CM

## 2013-04-11 DIAGNOSIS — L02219 Cutaneous abscess of trunk, unspecified: Secondary | ICD-10-CM

## 2013-04-11 DIAGNOSIS — I82629 Acute embolism and thrombosis of deep veins of unspecified upper extremity: Secondary | ICD-10-CM

## 2013-04-11 DIAGNOSIS — Z171 Estrogen receptor negative status [ER-]: Secondary | ICD-10-CM

## 2013-04-11 DIAGNOSIS — I82622 Acute embolism and thrombosis of deep veins of left upper extremity: Secondary | ICD-10-CM

## 2013-04-11 LAB — COMPREHENSIVE METABOLIC PANEL (CC13)
ALT: 59 U/L — ABNORMAL HIGH (ref 0–55)
AST: 36 U/L — ABNORMAL HIGH (ref 5–34)
Albumin: 3.2 g/dL — ABNORMAL LOW (ref 3.5–5.0)
Alkaline Phosphatase: 97 U/L (ref 40–150)
BUN: 13 mg/dL (ref 7.0–26.0)
CO2: 30 mEq/L — ABNORMAL HIGH (ref 22–29)
Calcium: 9.7 mg/dL (ref 8.4–10.4)
Chloride: 101 mEq/L (ref 98–109)
Creatinine: 0.7 mg/dL (ref 0.6–1.1)
Glucose: 131 mg/dl (ref 70–140)
Potassium: 4.1 mEq/L (ref 3.5–5.1)
Sodium: 141 mEq/L (ref 136–145)
Total Bilirubin: 0.32 mg/dL (ref 0.20–1.20)
Total Protein: 7.4 g/dL (ref 6.4–8.3)

## 2013-04-11 LAB — CBC WITH DIFFERENTIAL/PLATELET
BASO%: 0.5 % (ref 0.0–2.0)
Basophils Absolute: 0 10*3/uL (ref 0.0–0.1)
EOS%: 3.6 % (ref 0.0–7.0)
Eosinophils Absolute: 0.3 10*3/uL (ref 0.0–0.5)
HCT: 40.3 % (ref 34.8–46.6)
HGB: 13 g/dL (ref 11.6–15.9)
LYMPH%: 33.6 % (ref 14.0–49.7)
MCH: 27.4 pg (ref 25.1–34.0)
MCHC: 32.3 g/dL (ref 31.5–36.0)
MCV: 84.8 fL (ref 79.5–101.0)
MONO#: 0.6 10*3/uL (ref 0.1–0.9)
MONO%: 7.5 % (ref 0.0–14.0)
NEUT#: 4.1 10*3/uL (ref 1.5–6.5)
NEUT%: 54.8 % (ref 38.4–76.8)
Platelets: 264 10*3/uL (ref 145–400)
RBC: 4.75 10*6/uL (ref 3.70–5.45)
RDW: 14.6 % — ABNORMAL HIGH (ref 11.2–14.5)
WBC: 7.5 10*3/uL (ref 3.9–10.3)
lymph#: 2.5 10*3/uL (ref 0.9–3.3)
nRBC: 0 % (ref 0–0)

## 2013-04-11 MED ORDER — SODIUM CHLORIDE 0.9 % IV SOLN
Freq: Once | INTRAVENOUS | Status: AC
  Administered 2013-04-11: 11:00:00 via INTRAVENOUS

## 2013-04-11 MED ORDER — HEPARIN SOD (PORK) LOCK FLUSH 100 UNIT/ML IV SOLN
500.0000 [IU] | Freq: Once | INTRAVENOUS | Status: AC | PRN
  Administered 2013-04-11: 500 [IU]
  Filled 2013-04-11: qty 5

## 2013-04-11 MED ORDER — ACETAMINOPHEN 325 MG PO TABS
650.0000 mg | ORAL_TABLET | Freq: Once | ORAL | Status: AC
  Administered 2013-04-11: 650 mg via ORAL

## 2013-04-11 MED ORDER — SODIUM CHLORIDE 0.9 % IV SOLN
420.0000 mg | Freq: Once | INTRAVENOUS | Status: AC
  Administered 2013-04-11: 420 mg via INTRAVENOUS
  Filled 2013-04-11: qty 14

## 2013-04-11 MED ORDER — SODIUM CHLORIDE 0.9 % IJ SOLN
10.0000 mL | INTRAMUSCULAR | Status: DC | PRN
  Administered 2013-04-11: 10 mL
  Filled 2013-04-11: qty 10

## 2013-04-11 MED ORDER — DOXYCYCLINE HYCLATE 100 MG PO TABS: 100.0000 mg | ORAL_TABLET | Freq: Two times a day (BID) | ORAL | Status: DC

## 2013-04-11 MED ORDER — TRASTUZUMAB CHEMO INJECTION 440 MG
6.0000 mg/kg | Freq: Once | INTRAVENOUS | Status: AC
  Administered 2013-04-11: 903 mg via INTRAVENOUS
  Filled 2013-04-11: qty 43

## 2013-04-11 MED ORDER — PANTOPRAZOLE SODIUM 40 MG PO TBEC: 40.0000 mg | DELAYED_RELEASE_TABLET | Freq: Every day | ORAL | Status: DC

## 2013-04-11 MED ORDER — DIPHENHYDRAMINE HCL 25 MG PO CAPS
25.0000 mg | ORAL_CAPSULE | Freq: Once | ORAL | Status: AC
  Administered 2013-04-11: 25 mg via ORAL

## 2013-04-11 NOTE — Patient Instructions (Addendum)
[facility] Discharge Instructions for Patients Receiving Chemotherapy  Today you received the following chemotherapy agents: herceptin, perjeta  To help prevent nausea and vomiting after your treatment, we encourage you to take your nausea medication.  Take it as often as prescribed.     If you develop nausea and vomiting that is not controlled by your nausea medication, call the clinic. If it is after clinic hours your family physician or the after hours number for the clinic or go to the Emergency Department.   BELOW ARE SYMPTOMS THAT SHOULD BE REPORTED IMMEDIATELY:  *FEVER GREATER THAN 100.5 F  *CHILLS WITH OR WITHOUT FEVER  NAUSEA AND VOMITING THAT IS NOT CONTROLLED WITH YOUR NAUSEA MEDICATION  *UNUSUAL SHORTNESS OF BREATH  *UNUSUAL BRUISING OR BLEEDING  TENDERNESS IN MOUTH AND THROAT WITH OR WITHOUT PRESENCE OF ULCERS  *URINARY PROBLEMS  *BOWEL PROBLEMS  UNUSUAL RASH Items with * indicate a potential emergency and should be followed up as soon as possible.  Feel free to call the clinic you have any questions or concerns. The clinic phone number is [phone].   I have been informed and understand all the instructions given to me. I know to contact the clinic, my physician, or go to the Emergency Department if any problems should occur. I do not have any questions at this time, but understand that I may call the clinic during office hours   should I have any questions or need assistance in obtaining follow up care.    __________________________________________  _____________  __________ Signature of Patient or Authorized Representative            Date                   Time    __________________________________________ Nurse's Signature    

## 2013-04-11 NOTE — Progress Notes (Signed)
04/11/13 [name] is in the cancer center today for lab work, physical exam, and treatment. She completed the first quarter PROs using the Ipad before her physical exam. She was given a copy of the Patient newsletter, Volume 1, Issue 2, dated March 2014.
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
## 2013-04-12 ENCOUNTER — Telehealth: Payer: Self-pay | Admitting: *Deleted

## 2013-04-12 NOTE — Telephone Encounter (Signed)
Lm @ [facility] waiting for someone to return my call so that the pt can get scheduled...td

## 2013-04-12 NOTE — Telephone Encounter (Signed)
Tired to inform the pt that [name] form [facility] will call her closer to Oct. To schedule her for an echo/ [facility]...td

## 2013-04-29 ENCOUNTER — Other Ambulatory Visit: Payer: Self-pay | Admitting: Cardiology

## 2013-05-02 ENCOUNTER — Other Ambulatory Visit (HOSPITAL_BASED_OUTPATIENT_CLINIC_OR_DEPARTMENT_OTHER): Payer: Medicare Other | Admitting: Lab

## 2013-05-02 ENCOUNTER — Ambulatory Visit (HOSPITAL_BASED_OUTPATIENT_CLINIC_OR_DEPARTMENT_OTHER): Payer: Medicare Other

## 2013-05-02 VITALS — BP 117/66 | HR 66 | Temp 97.1°F | Resp 20

## 2013-05-02 DIAGNOSIS — C50219 Malignant neoplasm of upper-inner quadrant of unspecified female breast: Secondary | ICD-10-CM

## 2013-05-02 DIAGNOSIS — C50212 Malignant neoplasm of upper-inner quadrant of left female breast: Secondary | ICD-10-CM

## 2013-05-02 DIAGNOSIS — Z5112 Encounter for antineoplastic immunotherapy: Secondary | ICD-10-CM

## 2013-05-02 DIAGNOSIS — C50912 Malignant neoplasm of unspecified site of left female breast: Secondary | ICD-10-CM

## 2013-05-02 LAB — CBC WITH DIFFERENTIAL/PLATELET
BASO%: 0.2 % (ref 0.0–2.0)
Basophils Absolute: 0 10*3/uL (ref 0.0–0.1)
EOS%: 3 % (ref 0.0–7.0)
Eosinophils Absolute: 0.2 10*3/uL (ref 0.0–0.5)
HCT: 40.1 % (ref 34.8–46.6)
HGB: 12.9 g/dL (ref 11.6–15.9)
LYMPH%: 34 % (ref 14.0–49.7)
MCH: 27 pg (ref 25.1–34.0)
MCHC: 32.2 g/dL (ref 31.5–36.0)
MCV: 84.1 fL (ref 79.5–101.0)
MONO#: 0.3 10*3/uL (ref 0.1–0.9)
MONO%: 6.2 % (ref 0.0–14.0)
NEUT#: 3 10*3/uL (ref 1.5–6.5)
NEUT%: 56.6 % (ref 38.4–76.8)
Platelets: 261 10*3/uL (ref 145–400)
RBC: 4.77 10*6/uL (ref 3.70–5.45)
RDW: 14.4 % (ref 11.2–14.5)
WBC: 5.4 10*3/uL (ref 3.9–10.3)
lymph#: 1.8 10*3/uL (ref 0.9–3.3)

## 2013-05-02 LAB — COMPREHENSIVE METABOLIC PANEL (CC13)
ALT: 59 U/L — ABNORMAL HIGH (ref 0–55)
AST: 34 U/L (ref 5–34)
Albumin: 3 g/dL — ABNORMAL LOW (ref 3.5–5.0)
Alkaline Phosphatase: 89 U/L (ref 40–150)
BUN: 17.5 mg/dL (ref 7.0–26.0)
CO2: 30 mEq/L — ABNORMAL HIGH (ref 22–29)
Calcium: 9.7 mg/dL (ref 8.4–10.4)
Chloride: 103 mEq/L (ref 98–109)
Creatinine: 0.8 mg/dL (ref 0.6–1.1)
Glucose: 136 mg/dl (ref 70–140)
Potassium: 4.3 mEq/L (ref 3.5–5.1)
Sodium: 141 mEq/L (ref 136–145)
Total Bilirubin: 0.2 mg/dL (ref 0.20–1.20)
Total Protein: 7.2 g/dL (ref 6.4–8.3)

## 2013-05-02 MED ORDER — ACETAMINOPHEN 325 MG PO TABS
650.0000 mg | ORAL_TABLET | Freq: Once | ORAL | Status: AC
  Administered 2013-05-02: 650 mg via ORAL

## 2013-05-02 MED ORDER — DIPHENHYDRAMINE HCL 25 MG PO CAPS
25.0000 mg | ORAL_CAPSULE | Freq: Once | ORAL | Status: AC
  Administered 2013-05-02: 25 mg via ORAL

## 2013-05-02 MED ORDER — DIPHENHYDRAMINE HCL 25 MG PO CAPS
ORAL_CAPSULE | ORAL | Status: AC
  Filled 2013-05-02: qty 1

## 2013-05-02 MED ORDER — SODIUM CHLORIDE 0.9 % IJ SOLN
10.0000 mL | INTRAMUSCULAR | Status: DC | PRN
  Administered 2013-05-02: 10 mL
  Filled 2013-05-02: qty 10

## 2013-05-02 MED ORDER — ACETAMINOPHEN 325 MG PO TABS
ORAL_TABLET | ORAL | Status: AC
  Filled 2013-05-02: qty 2

## 2013-05-02 MED ORDER — SODIUM CHLORIDE 0.9 % IV SOLN
Freq: Once | INTRAVENOUS | Status: AC
  Administered 2013-05-02: 12:00:00 via INTRAVENOUS

## 2013-05-02 MED ORDER — SODIUM CHLORIDE 0.9 % IV SOLN
420.0000 mg | Freq: Once | INTRAVENOUS | Status: AC
  Administered 2013-05-02: 420 mg via INTRAVENOUS
  Filled 2013-05-02: qty 14

## 2013-05-02 MED ORDER — HEPARIN SOD (PORK) LOCK FLUSH 100 UNIT/ML IV SOLN
500.0000 [IU] | Freq: Once | INTRAVENOUS | Status: AC | PRN
  Administered 2013-05-02: 500 [IU]
  Filled 2013-05-02: qty 5

## 2013-05-02 MED ORDER — TRASTUZUMAB CHEMO INJECTION 440 MG
6.0000 mg/kg | Freq: Once | INTRAVENOUS | Status: AC
  Administered 2013-05-02: 903 mg via INTRAVENOUS
  Filled 2013-05-02: qty 43

## 2013-05-02 NOTE — Patient Instructions (Addendum)
[facility] Discharge Instructions for Patients Receiving Chemotherapy  Today you received the following chemotherapy agents Herceptin and Perjeta.  To help prevent nausea and vomiting after your treatment, we encourage you to take your nausea medication as prescribed.   If you develop nausea and vomiting that is not controlled by your nausea medication, call the clinic.   BELOW ARE SYMPTOMS THAT SHOULD BE REPORTED IMMEDIATELY:  *FEVER GREATER THAN 100.5 F  *CHILLS WITH OR WITHOUT FEVER  NAUSEA AND VOMITING THAT IS NOT CONTROLLED WITH YOUR NAUSEA MEDICATION  *UNUSUAL SHORTNESS OF BREATH  *UNUSUAL BRUISING OR BLEEDING  TENDERNESS IN MOUTH AND THROAT WITH OR WITHOUT PRESENCE OF ULCERS  *URINARY PROBLEMS  *BOWEL PROBLEMS  UNUSUAL RASH Items with * indicate a potential emergency and should be followed up as soon as possible.  Feel free to call the clinic you have any questions or concerns. The clinic phone number is [phone].    

## 2013-05-08 ENCOUNTER — Other Ambulatory Visit: Payer: Self-pay | Admitting: Oncology

## 2013-05-08 ENCOUNTER — Other Ambulatory Visit: Payer: Self-pay | Admitting: Physician Assistant

## 2013-05-08 DIAGNOSIS — C50212 Malignant neoplasm of upper-inner quadrant of left female breast: Secondary | ICD-10-CM

## 2013-05-08 DIAGNOSIS — F411 Generalized anxiety disorder: Secondary | ICD-10-CM

## 2013-05-08 DIAGNOSIS — C7800 Secondary malignant neoplasm of unspecified lung: Secondary | ICD-10-CM

## 2013-05-08 DIAGNOSIS — C50919 Malignant neoplasm of unspecified site of unspecified female breast: Secondary | ICD-10-CM

## 2013-05-19 ENCOUNTER — Ambulatory Visit (HOSPITAL_COMMUNITY)
Admission: RE | Admit: 2013-05-19 | Discharge: 2013-05-19 | Disposition: A | Discharge status: Home / Self Care | Payer: Medicare Other | Source: Ambulatory Visit | Attending: Oncology | Admitting: Oncology

## 2013-05-19 DIAGNOSIS — C50212 Malignant neoplasm of upper-inner quadrant of left female breast: Secondary | ICD-10-CM

## 2013-05-19 DIAGNOSIS — K7689 Other specified diseases of liver: Secondary | ICD-10-CM | POA: Insufficient documentation

## 2013-05-19 DIAGNOSIS — R918 Other nonspecific abnormal finding of lung field: Secondary | ICD-10-CM | POA: Insufficient documentation

## 2013-05-19 DIAGNOSIS — C50919 Malignant neoplasm of unspecified site of unspecified female breast: Secondary | ICD-10-CM | POA: Insufficient documentation

## 2013-05-19 MED ORDER — IOHEXOL 300 MG/ML  SOLN
100.0000 mL | Freq: Once | INTRAMUSCULAR | Status: AC | PRN
  Administered 2013-05-19: 100 mL via INTRAVENOUS

## 2013-05-23 ENCOUNTER — Other Ambulatory Visit (HOSPITAL_BASED_OUTPATIENT_CLINIC_OR_DEPARTMENT_OTHER): Payer: Medicare Other | Admitting: Lab

## 2013-05-23 ENCOUNTER — Telehealth: Payer: Self-pay | Admitting: *Deleted

## 2013-05-23 ENCOUNTER — Encounter: Payer: Self-pay | Admitting: *Deleted

## 2013-05-23 ENCOUNTER — Ambulatory Visit (HOSPITAL_BASED_OUTPATIENT_CLINIC_OR_DEPARTMENT_OTHER): Payer: Medicare Other

## 2013-05-23 ENCOUNTER — Ambulatory Visit (HOSPITAL_BASED_OUTPATIENT_CLINIC_OR_DEPARTMENT_OTHER): Payer: Medicare Other | Admitting: Oncology

## 2013-05-23 VITALS — BP 159/88 | HR 73 | Temp 98.6°F | Resp 20 | Ht 63.0 in | Wt 325.5 lb

## 2013-05-23 DIAGNOSIS — C50219 Malignant neoplasm of upper-inner quadrant of unspecified female breast: Secondary | ICD-10-CM

## 2013-05-23 DIAGNOSIS — C7800 Secondary malignant neoplasm of unspecified lung: Secondary | ICD-10-CM

## 2013-05-23 DIAGNOSIS — Z86718 Personal history of other venous thrombosis and embolism: Secondary | ICD-10-CM

## 2013-05-23 DIAGNOSIS — Z170 Estrogen receptor positive status [ER+]: Secondary | ICD-10-CM | POA: Insufficient documentation

## 2013-05-23 DIAGNOSIS — C50212 Malignant neoplasm of upper-inner quadrant of left female breast: Secondary | ICD-10-CM

## 2013-05-23 DIAGNOSIS — C50912 Malignant neoplasm of unspecified site of left female breast: Secondary | ICD-10-CM

## 2013-05-23 DIAGNOSIS — Z7901 Long term (current) use of anticoagulants: Secondary | ICD-10-CM

## 2013-05-23 DIAGNOSIS — B372 Candidiasis of skin and nail: Secondary | ICD-10-CM

## 2013-05-23 DIAGNOSIS — Z5112 Encounter for antineoplastic immunotherapy: Secondary | ICD-10-CM

## 2013-05-23 LAB — CBC WITH DIFFERENTIAL/PLATELET
BASO%: 0.2 % (ref 0.0–2.0)
Basophils Absolute: 0 10*3/uL (ref 0.0–0.1)
EOS%: 2.9 % (ref 0.0–7.0)
Eosinophils Absolute: 0.2 10*3/uL (ref 0.0–0.5)
HCT: 40.8 % (ref 34.8–46.6)
HGB: 13.4 g/dL (ref 11.6–15.9)
LYMPH%: 33.9 % (ref 14.0–49.7)
MCH: 26.8 pg (ref 25.1–34.0)
MCHC: 32.8 g/dL (ref 31.5–36.0)
MCV: 81.6 fL (ref 79.5–101.0)
MONO#: 0.4 10*3/uL (ref 0.1–0.9)
MONO%: 7.1 % (ref 0.0–14.0)
NEUT#: 3.5 10*3/uL (ref 1.5–6.5)
NEUT%: 55.9 % (ref 38.4–76.8)
Platelets: 249 10*3/uL (ref 145–400)
RBC: 5 10*6/uL (ref 3.70–5.45)
RDW: 14.8 % — ABNORMAL HIGH (ref 11.2–14.5)
WBC: 6.2 10*3/uL (ref 3.9–10.3)
lymph#: 2.1 10*3/uL (ref 0.9–3.3)

## 2013-05-23 MED ORDER — ACETAMINOPHEN 325 MG PO TABS
650.0000 mg | ORAL_TABLET | Freq: Once | ORAL | Status: AC
  Administered 2013-05-23: 650 mg via ORAL

## 2013-05-23 MED ORDER — TRASTUZUMAB CHEMO INJECTION 440 MG
6.0000 mg/kg | Freq: Once | INTRAVENOUS | Status: AC
  Administered 2013-05-23: 903 mg via INTRAVENOUS
  Filled 2013-05-23: qty 43

## 2013-05-23 MED ORDER — SODIUM CHLORIDE 0.9 % IV SOLN
Freq: Once | INTRAVENOUS | Status: AC
  Administered 2013-05-23: 11:00:00 via INTRAVENOUS

## 2013-05-23 MED ORDER — METOCLOPRAMIDE HCL 10 MG PO TABS: 5.0000 mg | ORAL_TABLET | Freq: Three times a day (TID) | ORAL | Status: DC

## 2013-05-23 MED ORDER — ACETAMINOPHEN 325 MG PO TABS
ORAL_TABLET | ORAL | Status: AC
  Filled 2013-05-23: qty 2

## 2013-05-23 MED ORDER — SODIUM CHLORIDE 0.9 % IJ SOLN
10.0000 mL | INTRAMUSCULAR | Status: DC | PRN
  Administered 2013-05-23: 10 mL
  Filled 2013-05-23: qty 10

## 2013-05-23 MED ORDER — DIPHENHYDRAMINE HCL 25 MG PO CAPS
25.0000 mg | ORAL_CAPSULE | Freq: Once | ORAL | Status: AC
  Administered 2013-05-23: 25 mg via ORAL

## 2013-05-23 MED ORDER — HEPARIN SOD (PORK) LOCK FLUSH 100 UNIT/ML IV SOLN
500.0000 [IU] | Freq: Once | INTRAVENOUS | Status: AC | PRN
  Administered 2013-05-23: 500 [IU]
  Filled 2013-05-23: qty 5

## 2013-05-23 MED ORDER — SODIUM CHLORIDE 0.9 % IV SOLN
420.0000 mg | Freq: Once | INTRAVENOUS | Status: AC
  Administered 2013-05-23: 420 mg via INTRAVENOUS
  Filled 2013-05-23: qty 14

## 2013-05-23 MED ORDER — DIPHENHYDRAMINE HCL 25 MG PO CAPS
ORAL_CAPSULE | ORAL | Status: AC
  Filled 2013-05-23: qty 1
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
## 2013-05-23 NOTE — Telephone Encounter (Signed)
Per staff message and POF I have scheduled appts.  JMW  

## 2013-05-23 NOTE — Progress Notes (Signed)
Patient monitored x30 minutes post perjeta and discharged to home in no apparent distress

## 2013-05-23 NOTE — Addendum Note (Signed)
Addended by: Billey Co on: 05/23/2013 05:11 PM   Modules accepted: Orders, Medications

## 2013-05-23 NOTE — Telephone Encounter (Signed)
appts made and printed. Pt is aware that tx will be added. i emailed MW to add the tx. Pt is aware of her xray for 08/23/13. Pt is aware that i will call w/ an echo appt. gv order to [place]...td

## 2013-05-23 NOTE — Patient Instructions (Addendum)
[facility] Discharge Instructions for Patients Receiving Chemotherapy  Today you received the following chemotherapy agents: Herceptin, Perjeta  To help prevent nausea and vomiting after your treatment, we encourage you to take your nausea medication as prescribed.    If you develop nausea and vomiting that is not controlled by your nausea medication, call the clinic.   BELOW ARE SYMPTOMS THAT SHOULD BE REPORTED IMMEDIATELY:  *FEVER GREATER THAN 100.5 F  *CHILLS WITH OR WITHOUT FEVER  NAUSEA AND VOMITING THAT IS NOT CONTROLLED WITH YOUR NAUSEA MEDICATION  *UNUSUAL SHORTNESS OF BREATH  *UNUSUAL BRUISING OR BLEEDING  TENDERNESS IN MOUTH AND THROAT WITH OR WITHOUT PRESENCE OF ULCERS  *URINARY PROBLEMS  *BOWEL PROBLEMS  UNUSUAL RASH Items with * indicate a potential emergency and should be followed up as soon as possible.  Feel free to call the clinic you have any questions or concerns. The clinic phone number is [phone].    

## 2013-05-23 NOTE — Progress Notes (Signed)
05/23/2013 Met with patient briefly in infusion area today to introduce myself as the research nurse covering during [employer] absence. We discussed the upcoming PRO expected to be completed between mid-November and mid-December. Patient states that she hopes she will be able to remember her password this time. Explained to patient that I would be following her schedule to determine the optimal time for her to complete the PRO questionnaires in the clinic during the specified time frame. Patient was given one of my business cards so that she may contact me for any questions related to the study. Cindy S. Clelia Croft BSN, RN, CCRP 05/23/2013 11:46 AM

## 2013-05-23 NOTE — Telephone Encounter (Signed)
Echo and bensimhon is already scheduled....td

## 2013-06-08 ENCOUNTER — Ambulatory Visit (HOSPITAL_COMMUNITY): Payer: Medicare Other

## 2013-06-08 ENCOUNTER — Encounter (HOSPITAL_COMMUNITY): Payer: Medicare Other

## 2013-06-13 ENCOUNTER — Ambulatory Visit (HOSPITAL_BASED_OUTPATIENT_CLINIC_OR_DEPARTMENT_OTHER): Payer: Medicare Other

## 2013-06-13 ENCOUNTER — Encounter (INDEPENDENT_AMBULATORY_CARE_PROVIDER_SITE_OTHER): Payer: Self-pay

## 2013-06-13 ENCOUNTER — Other Ambulatory Visit (HOSPITAL_BASED_OUTPATIENT_CLINIC_OR_DEPARTMENT_OTHER): Payer: Medicare Other | Admitting: Lab

## 2013-06-13 VITALS — BP 148/76 | HR 73 | Temp 98.2°F

## 2013-06-13 DIAGNOSIS — Z5112 Encounter for antineoplastic immunotherapy: Secondary | ICD-10-CM

## 2013-06-13 DIAGNOSIS — C50212 Malignant neoplasm of upper-inner quadrant of left female breast: Secondary | ICD-10-CM

## 2013-06-13 DIAGNOSIS — C50912 Malignant neoplasm of unspecified site of left female breast: Secondary | ICD-10-CM

## 2013-06-13 DIAGNOSIS — C50219 Malignant neoplasm of upper-inner quadrant of unspecified female breast: Secondary | ICD-10-CM

## 2013-06-13 LAB — CBC WITH DIFFERENTIAL/PLATELET
BASO%: 0.2 % (ref 0.0–2.0)
Basophils Absolute: 0 10*3/uL (ref 0.0–0.1)
EOS%: 2.4 % (ref 0.0–7.0)
Eosinophils Absolute: 0.2 10*3/uL (ref 0.0–0.5)
HCT: 41.1 % (ref 34.8–46.6)
HGB: 13.5 g/dL (ref 11.6–15.9)
LYMPH%: 34.4 % (ref 14.0–49.7)
MCH: 26.4 pg (ref 25.1–34.0)
MCHC: 32.8 g/dL (ref 31.5–36.0)
MCV: 80.3 fL (ref 79.5–101.0)
MONO#: 0.3 10*3/uL (ref 0.1–0.9)
MONO%: 5.1 % (ref 0.0–14.0)
NEUT#: 3.8 10*3/uL (ref 1.5–6.5)
NEUT%: 57.9 % (ref 38.4–76.8)
Platelets: 249 10*3/uL (ref 145–400)
RBC: 5.12 10*6/uL (ref 3.70–5.45)
RDW: 15.1 % — ABNORMAL HIGH (ref 11.2–14.5)
WBC: 6.6 10*3/uL (ref 3.9–10.3)
lymph#: 2.3 10*3/uL (ref 0.9–3.3)
nRBC: 0 % (ref 0–0)

## 2013-06-13 MED ORDER — SODIUM CHLORIDE 0.9 % IV SOLN
Freq: Once | INTRAVENOUS | Status: AC
  Administered 2013-06-13: 10:00:00 via INTRAVENOUS

## 2013-06-13 MED ORDER — SODIUM CHLORIDE 0.9 % IV SOLN
6.0000 mg/kg | Freq: Once | INTRAVENOUS | Status: AC
  Administered 2013-06-13: 903 mg via INTRAVENOUS
  Filled 2013-06-13: qty 43

## 2013-06-13 MED ORDER — ACETAMINOPHEN 325 MG PO TABS
650.0000 mg | ORAL_TABLET | Freq: Once | ORAL | Status: AC
  Administered 2013-06-13: 650 mg via ORAL

## 2013-06-13 MED ORDER — DIPHENHYDRAMINE HCL 25 MG PO CAPS
25.0000 mg | ORAL_CAPSULE | Freq: Once | ORAL | Status: AC
  Administered 2013-06-13: 25 mg via ORAL

## 2013-06-13 MED ORDER — PERTUZUMAB CHEMO INJECTION 420 MG/14ML
420.0000 mg | Freq: Once | INTRAVENOUS | Status: AC
  Administered 2013-06-13: 420 mg via INTRAVENOUS
  Filled 2013-06-13: qty 14

## 2013-06-13 MED ORDER — ACETAMINOPHEN 325 MG PO TABS
ORAL_TABLET | ORAL | Status: AC
  Filled 2013-06-13: qty 2

## 2013-06-13 MED ORDER — HEPARIN SOD (PORK) LOCK FLUSH 100 UNIT/ML IV SOLN
500.0000 [IU] | Freq: Once | INTRAVENOUS | Status: AC | PRN
  Administered 2013-06-13: 500 [IU]
  Filled 2013-06-13: qty 5

## 2013-06-13 MED ORDER — DIPHENHYDRAMINE HCL 25 MG PO CAPS
ORAL_CAPSULE | ORAL | Status: AC
  Filled 2013-06-13: qty 1

## 2013-06-13 MED ORDER — SODIUM CHLORIDE 0.9 % IJ SOLN
10.0000 mL | INTRAMUSCULAR | Status: DC | PRN
  Administered 2013-06-13: 10 mL
  Filled 2013-06-13: qty 10

## 2013-06-13 NOTE — Patient Instructions (Signed)
[facility] Discharge Instructions for Patients Receiving Chemotherapy  Today you received the following chemotherapy agents:  Herceptin and Perjeta  To help prevent nausea and vomiting after your treatment, we encourage you to take your nausea medication as ordered per MD.   If you develop nausea and vomiting that is not controlled by your nausea medication, call the clinic.   BELOW ARE SYMPTOMS THAT SHOULD BE REPORTED IMMEDIATELY:  *FEVER GREATER THAN 100.5 F  *CHILLS WITH OR WITHOUT FEVER  NAUSEA AND VOMITING THAT IS NOT CONTROLLED WITH YOUR NAUSEA MEDICATION  *UNUSUAL SHORTNESS OF BREATH  *UNUSUAL BRUISING OR BLEEDING  TENDERNESS IN MOUTH AND THROAT WITH OR WITHOUT PRESENCE OF ULCERS  *URINARY PROBLEMS  *BOWEL PROBLEMS  UNUSUAL RASH Items with * indicate a potential emergency and should be followed up as soon as possible.  Feel free to call the clinic you have any questions or concerns. The clinic phone number is [phone].    

## 2013-06-13 NOTE — Progress Notes (Signed)
0940-No need for CMET today per Dr. Darnelle Catalan  1220-Pt discharged to home after 30 minute observation post Perjeta.  Pt has no complaints at this time.

## 2013-06-14 ENCOUNTER — Encounter (HOSPITAL_COMMUNITY): Payer: Self-pay | Admitting: Cardiology

## 2013-06-14 ENCOUNTER — Telehealth (HOSPITAL_COMMUNITY): Payer: Self-pay | Admitting: Cardiology

## 2013-06-14 NOTE — Telephone Encounter (Signed)
ATTEMPTING TO SCHEDULE 3 MONTH FOLLOW UP WITH ECHO I have been unable to reach this patient by phone.  A letter is being sent to the last known home address.

## 2013-07-04 ENCOUNTER — Encounter: Payer: Self-pay | Admitting: Oncology

## 2013-07-04 ENCOUNTER — Telehealth: Payer: Self-pay | Admitting: Oncology

## 2013-07-04 ENCOUNTER — Other Ambulatory Visit: Payer: Self-pay | Admitting: Physician Assistant

## 2013-07-04 ENCOUNTER — Ambulatory Visit (HOSPITAL_BASED_OUTPATIENT_CLINIC_OR_DEPARTMENT_OTHER): Payer: Medicare Other

## 2013-07-04 ENCOUNTER — Other Ambulatory Visit (HOSPITAL_BASED_OUTPATIENT_CLINIC_OR_DEPARTMENT_OTHER): Payer: Medicare Other | Admitting: Lab

## 2013-07-04 VITALS — BP 162/82 | HR 70 | Temp 97.7°F | Resp 20

## 2013-07-04 DIAGNOSIS — C50919 Malignant neoplasm of unspecified site of unspecified female breast: Secondary | ICD-10-CM

## 2013-07-04 DIAGNOSIS — C50219 Malignant neoplasm of upper-inner quadrant of unspecified female breast: Secondary | ICD-10-CM

## 2013-07-04 DIAGNOSIS — C7800 Secondary malignant neoplasm of unspecified lung: Secondary | ICD-10-CM

## 2013-07-04 DIAGNOSIS — Z5112 Encounter for antineoplastic immunotherapy: Secondary | ICD-10-CM

## 2013-07-04 DIAGNOSIS — C50912 Malignant neoplasm of unspecified site of left female breast: Secondary | ICD-10-CM

## 2013-07-04 DIAGNOSIS — C50212 Malignant neoplasm of upper-inner quadrant of left female breast: Secondary | ICD-10-CM

## 2013-07-04 LAB — CBC WITH DIFFERENTIAL/PLATELET
BASO%: 0.2 % (ref 0.0–2.0)
Basophils Absolute: 0 10*3/uL (ref 0.0–0.1)
EOS%: 2.8 % (ref 0.0–7.0)
Eosinophils Absolute: 0.2 10*3/uL (ref 0.0–0.5)
HCT: 41.4 % (ref 34.8–46.6)
HGB: 13.4 g/dL (ref 11.6–15.9)
LYMPH%: 35.5 % (ref 14.0–49.7)
MCH: 26.4 pg (ref 25.1–34.0)
MCHC: 32.4 g/dL (ref 31.5–36.0)
MCV: 81.7 fL (ref 79.5–101.0)
MONO#: 0.4 10*3/uL (ref 0.1–0.9)
MONO%: 6.2 % (ref 0.0–14.0)
NEUT#: 3.1 10*3/uL (ref 1.5–6.5)
NEUT%: 55.3 % (ref 38.4–76.8)
Platelets: 255 10*3/uL (ref 145–400)
RBC: 5.07 10*6/uL (ref 3.70–5.45)
RDW: 16.1 % — ABNORMAL HIGH (ref 11.2–14.5)
WBC: 5.6 10*3/uL (ref 3.9–10.3)
lymph#: 2 10*3/uL (ref 0.9–3.3)
nRBC: 0 % (ref 0–0)

## 2013-07-04 MED ORDER — ACETAMINOPHEN 325 MG PO TABS
650.0000 mg | ORAL_TABLET | Freq: Once | ORAL | Status: AC
  Administered 2013-07-04: 650 mg via ORAL

## 2013-07-04 MED ORDER — DIPHENHYDRAMINE HCL 25 MG PO CAPS
25.0000 mg | ORAL_CAPSULE | Freq: Once | ORAL | Status: AC
  Administered 2013-07-04: 25 mg via ORAL

## 2013-07-04 MED ORDER — TRASTUZUMAB CHEMO INJECTION 440 MG
6.0000 mg/kg | Freq: Once | INTRAVENOUS | Status: AC
  Administered 2013-07-04: 903 mg via INTRAVENOUS
  Filled 2013-07-04: qty 43

## 2013-07-04 MED ORDER — SODIUM CHLORIDE 0.9 % IV SOLN
Freq: Once | INTRAVENOUS | Status: AC
  Administered 2013-07-04: 10:00:00 via INTRAVENOUS

## 2013-07-04 MED ORDER — SODIUM CHLORIDE 0.9 % IJ SOLN
10.0000 mL | INTRAMUSCULAR | Status: DC | PRN
  Administered 2013-07-04: 10 mL
  Filled 2013-07-04: qty 10

## 2013-07-04 MED ORDER — HEPARIN SOD (PORK) LOCK FLUSH 100 UNIT/ML IV SOLN
500.0000 [IU] | Freq: Once | INTRAVENOUS | Status: AC | PRN
  Administered 2013-07-04: 500 [IU]
  Filled 2013-07-04: qty 5

## 2013-07-04 MED ORDER — ACETAMINOPHEN 325 MG PO TABS
ORAL_TABLET | ORAL | Status: AC
  Filled 2013-07-04: qty 2

## 2013-07-04 MED ORDER — DIPHENHYDRAMINE HCL 25 MG PO CAPS
ORAL_CAPSULE | ORAL | Status: AC
  Filled 2013-07-04: qty 1

## 2013-07-04 MED ORDER — SODIUM CHLORIDE 0.9 % IV SOLN
420.0000 mg | Freq: Once | INTRAVENOUS | Status: AC
  Administered 2013-07-04: 420 mg via INTRAVENOUS
  Filled 2013-07-04: qty 14

## 2013-07-04 NOTE — Patient Instructions (Signed)
[facility] Discharge Instructions for Patients Receiving Chemotherapy  Today you received the following chemotherapy/antibody agents: Herceptin (trastuzumab) & Perjeta (pertzumab)  To help prevent nausea and vomiting after your treatment, we encourage you to take your nausea medications as ordered:  Zofran 8 mg every 12 hours as needed for nausea  Compazine 10 mg every 6 hours as needed for nausea   If you develop nausea and vomiting that is not controlled by your nausea medication, call the clinic.   BELOW ARE SYMPTOMS THAT SHOULD BE REPORTED IMMEDIATELY:  *FEVER GREATER THAN 100.5 F  *CHILLS WITH OR WITHOUT FEVER  NAUSEA AND VOMITING THAT IS NOT CONTROLLED WITH YOUR NAUSEA MEDICATION  *UNUSUAL SHORTNESS OF BREATH  *UNUSUAL BRUISING OR BLEEDING  TENDERNESS IN MOUTH AND THROAT WITH OR WITHOUT PRESENCE OF ULCERS  *URINARY PROBLEMS  *BOWEL PROBLEMS  UNUSUAL RASH Items with * indicate a potential emergency and should be followed up as soon as possible.  Feel free to call the clinic you have any questions or concerns. The clinic phone number is [phone].  It has been a pleasure to serve you today !

## 2013-07-04 NOTE — Progress Notes (Signed)
07/04/2013 SystHER's [id number] - Patient is in the cancer center today for lab work and treatment. She completed the PROs using the Ipad during her infusion appt without any assistance, technical or otherwise. I thanked the patient for her continued participation in the study and her willingness to meet with me.

## 2013-07-04 NOTE — Progress Notes (Signed)
@  1240-OK to discharge home-no adverse event from pertuzumab.

## 2013-07-04 NOTE — Telephone Encounter (Signed)
, °

## 2013-07-06 ENCOUNTER — Other Ambulatory Visit (HOSPITAL_COMMUNITY): Payer: Self-pay | Admitting: Family Medicine

## 2013-07-12 IMAGING — XA IR FLUORO GUIDE CV LINE*R*
1 series · 1 of 1 positions shown · non-contrast
Comparison: none

TUNNELED CENTRAL VENOUS CATHETER WITH SUBCUTANEOUS RESERVOIR
(PORTACATH) PLACEMENT WITH ULTRASOUND AND FLUOROSCOPIC  GUIDANCE

Date: 11/29/2012
CLINICAL HISTORY: 51-year-old female with recently diagnosed left
breast cancer and multiple pulmonary nodules concerning for
metastatic disease.  She requires durable central venous access for
planned chemotherapy.

[Series 300: line placements · 1 of 1 slices shown]
[im 1/1]
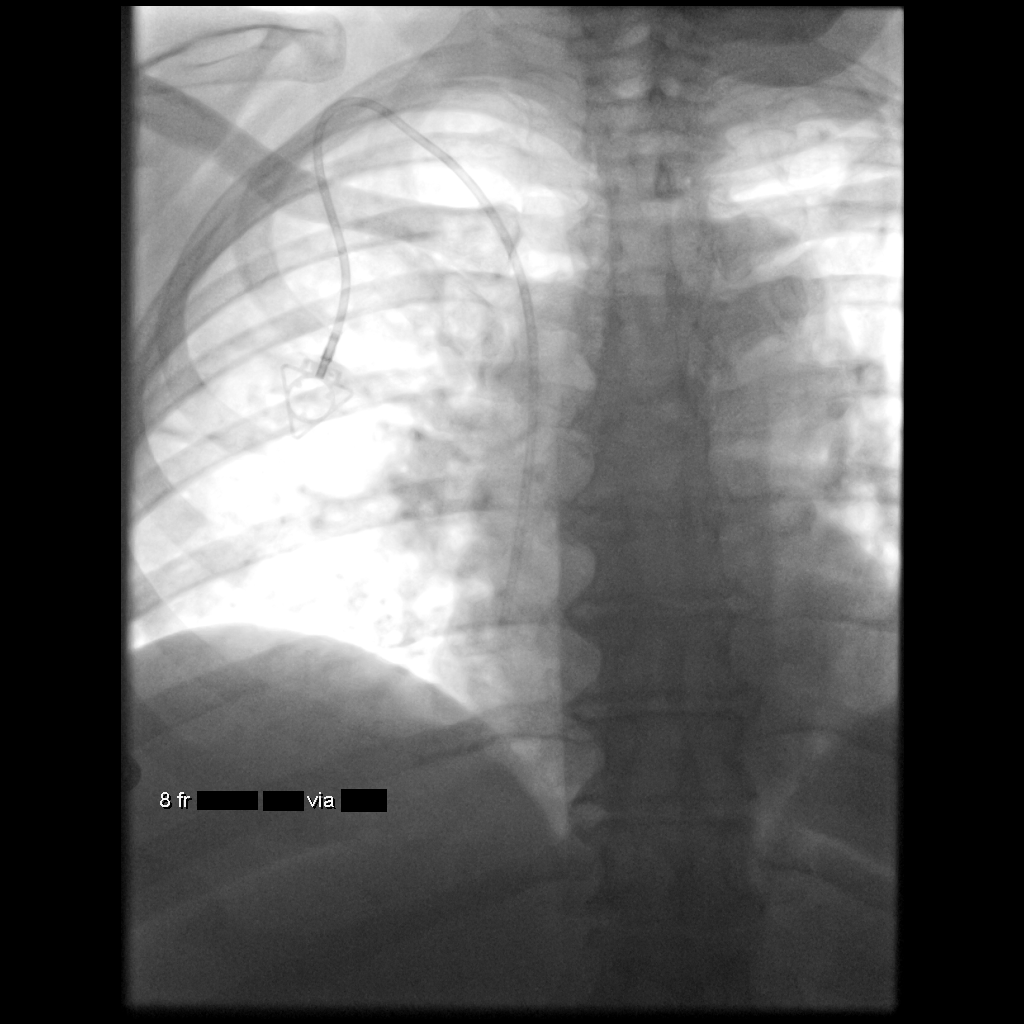

[1 of 1 positions shown; findings below may reference images not displayed]

Sedation: Moderate (conscious) sedation was administered during
this procedure.  A total of twomg Versed and 100mg Fentanyl were
administered intravenously.  The patient's vital signs were
monitored continuously by radiology nursing throughout the course
of the procedure.

Total sedation time: 35 minutes

Fluoroscopy Time: 0.3 minutes.

Procedure:

The right neck and chest was prepped with chlorhexidine, and draped
in the usual sterile fashion using maximum barrier technique (cap
and mask, sterile gown, sterile gloves, large sterile sheet, hand
hygiene and cutaneous antiseptic).  Antibiotic prophylaxis was
provided with 1.5g vancomycin administered IV one hour prior to
skin incision.  Local anesthesia was attained by infiltration with
1% lidocaine with epinephrine.

Ultrasound demonstrated patency of the right internal jugular vein,
and this was documented with an image.  Under real-time ultrasound
guidance, this vein was accessed with a 21 gauge micropuncture
needle and image documentation was performed.  A small dermatotomy
was made at the access site with an 11 scalpel.  A 0.018" wire was
advanced into the SVC and the access needle exchanged for a 4F
micropuncture vascular sheath.  The 0.018" wire was then removed
and a 0.035" wire advanced into the IVC.



The venous access site was then serially dilated and a peel away
vascular sheath placed over the wire.  The wire was removed and the
port catheter advanced into position under fluoroscopic guidance.
The catheter tip is positioned in the superior cavoatrial junction.
This was documented with a spot image. The portacatheter was then
tested and found to flush and aspirate well.  The port was flushed
with saline followed by 100 units/mL heparinized saline.

The pocket was then closed in two layers using first subdermal
inverted interrupted absorbable sutures followed by a running
subcuticular suture.  The epidermis was then sealed with Dermabond.
The dermatotomy at the venous access site was also closed with a
single inverted subdermal suture and the epidermis sealed with
Dermabond.

Complications:  None.  The patient tolerated the procedure well.
IMPRESSION: Successful placement of a right IJapproach PowerPort with
ultrasound and fluoroscopic guidance.  The catheter is ready for
use.

[REDACTED]

## 2013-07-25 ENCOUNTER — Ambulatory Visit (HOSPITAL_BASED_OUTPATIENT_CLINIC_OR_DEPARTMENT_OTHER): Payer: Medicare Other

## 2013-07-25 ENCOUNTER — Other Ambulatory Visit (HOSPITAL_BASED_OUTPATIENT_CLINIC_OR_DEPARTMENT_OTHER): Payer: Medicare Other | Admitting: Lab

## 2013-07-25 ENCOUNTER — Encounter (INDEPENDENT_AMBULATORY_CARE_PROVIDER_SITE_OTHER): Payer: Self-pay

## 2013-07-25 VITALS — BP 130/62 | HR 64 | Temp 98.4°F

## 2013-07-25 DIAGNOSIS — Z5112 Encounter for antineoplastic immunotherapy: Secondary | ICD-10-CM

## 2013-07-25 DIAGNOSIS — C50219 Malignant neoplasm of upper-inner quadrant of unspecified female breast: Secondary | ICD-10-CM

## 2013-07-25 DIAGNOSIS — C50212 Malignant neoplasm of upper-inner quadrant of left female breast: Secondary | ICD-10-CM

## 2013-07-25 DIAGNOSIS — C50919 Malignant neoplasm of unspecified site of unspecified female breast: Secondary | ICD-10-CM

## 2013-07-25 DIAGNOSIS — C7800 Secondary malignant neoplasm of unspecified lung: Secondary | ICD-10-CM

## 2013-07-25 DIAGNOSIS — C50912 Malignant neoplasm of unspecified site of left female breast: Secondary | ICD-10-CM

## 2013-07-25 LAB — CBC WITH DIFFERENTIAL/PLATELET
BASO%: 0.3 % (ref 0.0–2.0)
Basophils Absolute: 0 10*3/uL (ref 0.0–0.1)
EOS%: 2.5 % (ref 0.0–7.0)
Eosinophils Absolute: 0.2 10*3/uL (ref 0.0–0.5)
HCT: 39.6 % (ref 34.8–46.6)
HGB: 12.7 g/dL (ref 11.6–15.9)
LYMPH%: 35.7 % (ref 14.0–49.7)
MCH: 26.6 pg (ref 25.1–34.0)
MCHC: 32.1 g/dL (ref 31.5–36.0)
MCV: 82.8 fL (ref 79.5–101.0)
MONO#: 0.3 10*3/uL (ref 0.1–0.9)
MONO%: 4.7 % (ref 0.0–14.0)
NEUT#: 3.4 10*3/uL (ref 1.5–6.5)
NEUT%: 56.8 % (ref 38.4–76.8)
Platelets: 241 10*3/uL (ref 145–400)
RBC: 4.78 10*6/uL (ref 3.70–5.45)
RDW: 16 % — ABNORMAL HIGH (ref 11.2–14.5)
WBC: 6 10*3/uL (ref 3.9–10.3)
lymph#: 2.1 10*3/uL (ref 0.9–3.3)
nRBC: 0 % (ref 0–0)

## 2013-07-25 MED ORDER — FAMOTIDINE IN NACL 20-0.9 MG/50ML-% IV SOLN
20.0000 mg | Freq: Once | INTRAVENOUS | Status: AC
  Administered 2013-07-25: 20 mg via INTRAVENOUS

## 2013-07-25 MED ORDER — SODIUM CHLORIDE 0.9 % IV SOLN
Freq: Once | INTRAVENOUS | Status: AC
  Administered 2013-07-25: 10:00:00 via INTRAVENOUS

## 2013-07-25 MED ORDER — TRASTUZUMAB CHEMO INJECTION 440 MG
6.0000 mg/kg | Freq: Once | INTRAVENOUS | Status: AC
  Administered 2013-07-25: 903 mg via INTRAVENOUS
  Filled 2013-07-25: qty 43

## 2013-07-25 MED ORDER — SODIUM CHLORIDE 0.9 % IV SOLN
420.0000 mg | Freq: Once | INTRAVENOUS | Status: AC
  Administered 2013-07-25: 420 mg via INTRAVENOUS
  Filled 2013-07-25: qty 14

## 2013-07-25 MED ORDER — FAMOTIDINE IN NACL 20-0.9 MG/50ML-% IV SOLN
INTRAVENOUS | Status: AC
  Filled 2013-07-25: qty 50

## 2013-07-25 MED ORDER — ACETAMINOPHEN 325 MG PO TABS
650.0000 mg | ORAL_TABLET | Freq: Once | ORAL | Status: AC
  Administered 2013-07-25: 650 mg via ORAL

## 2013-07-25 MED ORDER — HEPARIN SOD (PORK) LOCK FLUSH 100 UNIT/ML IV SOLN
500.0000 [IU] | Freq: Once | INTRAVENOUS | Status: AC | PRN
  Administered 2013-07-25: 500 [IU]
  Filled 2013-07-25: qty 5

## 2013-07-25 MED ORDER — DIPHENHYDRAMINE HCL 25 MG PO CAPS
ORAL_CAPSULE | ORAL | Status: AC
  Filled 2013-07-25: qty 1

## 2013-07-25 MED ORDER — SODIUM CHLORIDE 0.9 % IJ SOLN
10.0000 mL | INTRAMUSCULAR | Status: DC | PRN
  Administered 2013-07-25: 10 mL
  Filled 2013-07-25: qty 10

## 2013-07-25 MED ORDER — ACETAMINOPHEN 325 MG PO TABS
ORAL_TABLET | ORAL | Status: AC
  Filled 2013-07-25: qty 2

## 2013-07-25 MED ORDER — DIPHENHYDRAMINE HCL 25 MG PO CAPS
25.0000 mg | ORAL_CAPSULE | Freq: Once | ORAL | Status: AC
  Administered 2013-07-25: 25 mg via ORAL

## 2013-07-25 NOTE — Patient Instructions (Signed)
[facility] Discharge Instructions for Patients Receiving Chemotherapy  Today you received the following chemotherapy agents ; Perjeta and Herceptin.   To help prevent nausea and vomiting after your treatment, we encourage you to take your nausea medication as directed.  Start Metoclopramide with meals as prescribed.    If you develop nausea and vomiting that is not controlled by your nausea medication, call the clinic.   BELOW ARE SYMPTOMS THAT SHOULD BE REPORTED IMMEDIATELY:  *FEVER GREATER THAN 100.5 F  *CHILLS WITH OR WITHOUT FEVER  NAUSEA AND VOMITING THAT IS NOT CONTROLLED WITH YOUR NAUSEA MEDICATION  *UNUSUAL SHORTNESS OF BREATH  *UNUSUAL BRUISING OR BLEEDING  TENDERNESS IN MOUTH AND THROAT WITH OR WITHOUT PRESENCE OF ULCERS  *URINARY PROBLEMS  *BOWEL PROBLEMS  UNUSUAL RASH Items with * indicate a potential emergency and should be followed up as soon as possible.  Feel free to call the clinic you have any questions or concerns. The clinic phone number is [phone].

## 2013-07-25 NOTE — Progress Notes (Signed)
Pt reports ongoing nausea at home and this morning.  States vomited on way home after last chemo treatment.  Took zofran 8mg  at home this morning and continues to c/o nausea and also reflux.  Reviewed pt's medication list.  She has not gotten Metoclopramide filled as ordered. States was unaware of new med.  Printed list and instructed on taking three times daily w/ meals.  [facility].  Pt verbalized understanding.   Also received verbal order from Dr. Darnelle Catalan to give Pepcid 20 mg IV prior to chemo today.   Pt states nausea relieved 30 minutes after Pepcid.

## 2013-08-14 ENCOUNTER — Other Ambulatory Visit: Payer: Self-pay | Admitting: Oncology

## 2013-08-15 ENCOUNTER — Ambulatory Visit (HOSPITAL_BASED_OUTPATIENT_CLINIC_OR_DEPARTMENT_OTHER): Payer: Medicare Other

## 2013-08-15 ENCOUNTER — Encounter (INDEPENDENT_AMBULATORY_CARE_PROVIDER_SITE_OTHER): Payer: Self-pay

## 2013-08-15 ENCOUNTER — Telehealth: Payer: Self-pay | Admitting: Oncology

## 2013-08-15 ENCOUNTER — Other Ambulatory Visit (HOSPITAL_BASED_OUTPATIENT_CLINIC_OR_DEPARTMENT_OTHER): Payer: Medicare Other

## 2013-08-15 VITALS — BP 153/79 | HR 77 | Temp 97.6°F

## 2013-08-15 DIAGNOSIS — C50219 Malignant neoplasm of upper-inner quadrant of unspecified female breast: Secondary | ICD-10-CM

## 2013-08-15 DIAGNOSIS — C50212 Malignant neoplasm of upper-inner quadrant of left female breast: Secondary | ICD-10-CM

## 2013-08-15 DIAGNOSIS — Z5112 Encounter for antineoplastic immunotherapy: Secondary | ICD-10-CM

## 2013-08-15 DIAGNOSIS — C7800 Secondary malignant neoplasm of unspecified lung: Secondary | ICD-10-CM

## 2013-08-15 DIAGNOSIS — C50912 Malignant neoplasm of unspecified site of left female breast: Secondary | ICD-10-CM

## 2013-08-15 LAB — CBC WITH DIFFERENTIAL/PLATELET
BASO%: 0.3 % (ref 0.0–2.0)
Basophils Absolute: 0 10*3/uL (ref 0.0–0.1)
EOS%: 1.8 % (ref 0.0–7.0)
Eosinophils Absolute: 0.1 10*3/uL (ref 0.0–0.5)
HCT: 42.7 % (ref 34.8–46.6)
HGB: 14.1 g/dL (ref 11.6–15.9)
LYMPH%: 32.4 % (ref 14.0–49.7)
MCH: 27.5 pg (ref 25.1–34.0)
MCHC: 33 g/dL (ref 31.5–36.0)
MCV: 83.4 fL (ref 79.5–101.0)
MONO#: 0.4 10*3/uL (ref 0.1–0.9)
MONO%: 5.2 % (ref 0.0–14.0)
NEUT#: 4.1 10*3/uL (ref 1.5–6.5)
NEUT%: 60.3 % (ref 38.4–76.8)
Platelets: 265 10*3/uL (ref 145–400)
RBC: 5.12 10*6/uL (ref 3.70–5.45)
RDW: 15.3 % — ABNORMAL HIGH (ref 11.2–14.5)
WBC: 6.7 10*3/uL (ref 3.9–10.3)
lymph#: 2.2 10*3/uL (ref 0.9–3.3)

## 2013-08-15 MED ORDER — SODIUM CHLORIDE 0.9 % IJ SOLN
10.0000 mL | INTRAMUSCULAR | Status: DC | PRN
  Administered 2013-08-15: 10 mL
  Filled 2013-08-15: qty 10

## 2013-08-15 MED ORDER — HEPARIN SOD (PORK) LOCK FLUSH 100 UNIT/ML IV SOLN
500.0000 [IU] | Freq: Once | INTRAVENOUS | Status: AC | PRN
  Administered 2013-08-15: 500 [IU]
  Filled 2013-08-15: qty 5

## 2013-08-15 MED ORDER — DIPHENHYDRAMINE HCL 25 MG PO CAPS
ORAL_CAPSULE | ORAL | Status: AC
  Filled 2013-08-15: qty 1

## 2013-08-15 MED ORDER — TRASTUZUMAB CHEMO INJECTION 440 MG
6.0000 mg/kg | Freq: Once | INTRAVENOUS | Status: AC
  Administered 2013-08-15: 903 mg via INTRAVENOUS
  Filled 2013-08-15: qty 43

## 2013-08-15 MED ORDER — DIPHENHYDRAMINE HCL 25 MG PO CAPS
25.0000 mg | ORAL_CAPSULE | Freq: Once | ORAL | Status: AC
  Administered 2013-08-15: 25 mg via ORAL

## 2013-08-15 MED ORDER — SODIUM CHLORIDE 0.9 % IV SOLN
420.0000 mg | Freq: Once | INTRAVENOUS | Status: AC
  Administered 2013-08-15: 420 mg via INTRAVENOUS
  Filled 2013-08-15: qty 14

## 2013-08-15 MED ORDER — ACETAMINOPHEN 325 MG PO TABS
650.0000 mg | ORAL_TABLET | Freq: Once | ORAL | Status: AC
  Administered 2013-08-15: 650 mg via ORAL

## 2013-08-15 MED ORDER — ACETAMINOPHEN 325 MG PO TABS
ORAL_TABLET | ORAL | Status: AC
  Filled 2013-08-15: qty 2

## 2013-08-15 MED ORDER — SODIUM CHLORIDE 0.9 % IV SOLN
Freq: Once | INTRAVENOUS | Status: AC
  Administered 2013-08-15: 11:00:00 via INTRAVENOUS

## 2013-08-15 NOTE — Patient Instructions (Signed)
[facility] Discharge Instructions for Patients Receiving Chemotherapy  Today you received the following chemotherapy agents Herceptin/Perjeta.  To help prevent nausea and vomiting after your treatment, we encourage you to take your nausea medication as prescribed.   If you develop nausea and vomiting that is not controlled by your nausea medication, call the clinic.   BELOW ARE SYMPTOMS THAT SHOULD BE REPORTED IMMEDIATELY:  *FEVER GREATER THAN 100.5 F  *CHILLS WITH OR WITHOUT FEVER  NAUSEA AND VOMITING THAT IS NOT CONTROLLED WITH YOUR NAUSEA MEDICATION  *UNUSUAL SHORTNESS OF BREATH  *UNUSUAL BRUISING OR BLEEDING  TENDERNESS IN MOUTH AND THROAT WITH OR WITHOUT PRESENCE OF ULCERS  *URINARY PROBLEMS  *BOWEL PROBLEMS  UNUSUAL RASH Items with * indicate a potential emergency and should be followed up as soon as possible.  Feel free to call the clinic you have any questions or concerns. The clinic phone number is [phone].    

## 2013-08-15 NOTE — Telephone Encounter (Signed)
, °

## 2013-08-17 ENCOUNTER — Telehealth: Payer: Self-pay | Admitting: Oncology

## 2013-08-23 ENCOUNTER — Other Ambulatory Visit (HOSPITAL_BASED_OUTPATIENT_CLINIC_OR_DEPARTMENT_OTHER): Payer: Medicare Other

## 2013-08-23 ENCOUNTER — Encounter: Payer: Self-pay | Admitting: Physician Assistant

## 2013-08-23 ENCOUNTER — Ambulatory Visit (HOSPITAL_BASED_OUTPATIENT_CLINIC_OR_DEPARTMENT_OTHER): Payer: Medicare Other | Admitting: Physician Assistant

## 2013-08-23 VITALS — BP 139/85 | HR 70 | Temp 97.8°F | Resp 18 | Ht 63.0 in | Wt 324.0 lb

## 2013-08-23 DIAGNOSIS — C50212 Malignant neoplasm of upper-inner quadrant of left female breast: Secondary | ICD-10-CM

## 2013-08-23 DIAGNOSIS — C7802 Secondary malignant neoplasm of left lung: Secondary | ICD-10-CM

## 2013-08-23 DIAGNOSIS — R0600 Dyspnea, unspecified: Secondary | ICD-10-CM

## 2013-08-23 DIAGNOSIS — C7800 Secondary malignant neoplasm of unspecified lung: Secondary | ICD-10-CM

## 2013-08-23 DIAGNOSIS — R110 Nausea: Secondary | ICD-10-CM

## 2013-08-23 DIAGNOSIS — R51 Headache: Secondary | ICD-10-CM

## 2013-08-23 DIAGNOSIS — C50919 Malignant neoplasm of unspecified site of unspecified female breast: Secondary | ICD-10-CM

## 2013-08-23 DIAGNOSIS — R42 Dizziness and giddiness: Secondary | ICD-10-CM

## 2013-08-23 DIAGNOSIS — C50912 Malignant neoplasm of unspecified site of left female breast: Secondary | ICD-10-CM

## 2013-08-23 DIAGNOSIS — R918 Other nonspecific abnormal finding of lung field: Secondary | ICD-10-CM

## 2013-08-23 DIAGNOSIS — Z86718 Personal history of other venous thrombosis and embolism: Secondary | ICD-10-CM

## 2013-08-23 DIAGNOSIS — F411 Generalized anxiety disorder: Secondary | ICD-10-CM

## 2013-08-23 LAB — CBC WITH DIFFERENTIAL/PLATELET
BASO%: 0.4 % (ref 0.0–2.0)
Basophils Absolute: 0 10*3/uL (ref 0.0–0.1)
EOS%: 1.6 % (ref 0.0–7.0)
Eosinophils Absolute: 0.1 10*3/uL (ref 0.0–0.5)
HCT: 43.4 % (ref 34.8–46.6)
HGB: 14 g/dL (ref 11.6–15.9)
LYMPH%: 37.2 % (ref 14.0–49.7)
MCH: 27.1 pg (ref 25.1–34.0)
MCHC: 32.3 g/dL (ref 31.5–36.0)
MCV: 84.1 fL (ref 79.5–101.0)
MONO#: 0.4 10*3/uL (ref 0.1–0.9)
MONO%: 5.7 % (ref 0.0–14.0)
NEUT#: 3.7 10*3/uL (ref 1.5–6.5)
NEUT%: 55.1 % (ref 38.4–76.8)
Platelets: 254 10*3/uL (ref 145–400)
RBC: 5.16 10*6/uL (ref 3.70–5.45)
RDW: 15.4 % — ABNORMAL HIGH (ref 11.2–14.5)
WBC: 6.7 10*3/uL (ref 3.9–10.3)
lymph#: 2.5 10*3/uL (ref 0.9–3.3)

## 2013-08-23 MED ORDER — METOCLOPRAMIDE HCL 5 MG PO TABS: 10.0000 mg | ORAL_TABLET | Freq: Three times a day (TID) | ORAL | Status: DC
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
## 2013-08-24 ENCOUNTER — Telehealth: Payer: Self-pay | Admitting: *Deleted

## 2013-08-24 ENCOUNTER — Encounter: Payer: Self-pay | Admitting: Oncology

## 2013-08-24 NOTE — Telephone Encounter (Signed)
Called pt to inquire  about yesterday's appt.No answer. Left a detailed message that we were concerned and wanted to make sure she was okay. Asked her to call me back so we can R/S her appts. I will continue throughout the day to call her until I can talk with her.Message forwarded to Micah Flesher, PA-C.

## 2013-08-25 ENCOUNTER — Other Ambulatory Visit: Payer: Self-pay | Admitting: Physician Assistant

## 2013-08-25 ENCOUNTER — Telehealth: Payer: Self-pay | Admitting: *Deleted

## 2013-08-25 NOTE — Telephone Encounter (Signed)
Per staff message and POF I have scheduled appts.  JMW  

## 2013-08-25 NOTE — Telephone Encounter (Signed)
Lm gv appt for 09/05/13 w/ labs@ 1:45p, ov@ 2:15p, and tx to follow. i also made her aware of the rest of her labs and tx's on 3/2, 3/23, and 4/13. i emailed MW to adjust time for 1/19 and to add the other tx's...td

## 2013-08-29 ENCOUNTER — Telehealth: Payer: Self-pay | Admitting: *Deleted

## 2013-08-29 ENCOUNTER — Ambulatory Visit
Admission: RE | Admit: 2013-08-29 | Discharge: 2013-08-29 | Disposition: A | Discharge status: Home / Self Care | Payer: Medicare Other | Source: Ambulatory Visit | Attending: Physician Assistant | Admitting: Physician Assistant

## 2013-08-29 DIAGNOSIS — R110 Nausea: Secondary | ICD-10-CM

## 2013-08-29 DIAGNOSIS — R42 Dizziness and giddiness: Secondary | ICD-10-CM

## 2013-08-29 DIAGNOSIS — C7800 Secondary malignant neoplasm of unspecified lung: Principal | ICD-10-CM

## 2013-08-29 DIAGNOSIS — C50919 Malignant neoplasm of unspecified site of unspecified female breast: Secondary | ICD-10-CM

## 2013-08-29 MED ORDER — GADOBENATE DIMEGLUMINE 529 MG/ML IV SOLN
20.0000 mL | Freq: Once | INTRAVENOUS | Status: AC | PRN
  Administered 2013-08-29: 20 mL via INTRAVENOUS

## 2013-08-29 NOTE — Telephone Encounter (Signed)
Per staff message I have adjusted appts

## 2013-08-30 ENCOUNTER — Ambulatory Visit (HOSPITAL_COMMUNITY)
Admission: RE | Admit: 2013-08-30 | Discharge: 2013-08-30 | Disposition: A | Discharge status: Home / Self Care | Payer: Medicare Other | Source: Ambulatory Visit | Attending: Physician Assistant | Admitting: Physician Assistant

## 2013-08-30 ENCOUNTER — Ambulatory Visit (HOSPITAL_COMMUNITY): Admission: RE | Admit: 2013-08-30 | Payer: Medicare Other | Source: Ambulatory Visit

## 2013-08-30 DIAGNOSIS — F3289 Other specified depressive episodes: Secondary | ICD-10-CM | POA: Insufficient documentation

## 2013-08-30 DIAGNOSIS — C50919 Malignant neoplasm of unspecified site of unspecified female breast: Secondary | ICD-10-CM | POA: Insufficient documentation

## 2013-08-30 DIAGNOSIS — E119 Type 2 diabetes mellitus without complications: Secondary | ICD-10-CM | POA: Insufficient documentation

## 2013-08-30 DIAGNOSIS — G4730 Sleep apnea, unspecified: Secondary | ICD-10-CM | POA: Insufficient documentation

## 2013-08-30 DIAGNOSIS — F411 Generalized anxiety disorder: Secondary | ICD-10-CM | POA: Insufficient documentation

## 2013-08-30 DIAGNOSIS — F329 Major depressive disorder, single episode, unspecified: Secondary | ICD-10-CM | POA: Insufficient documentation

## 2013-08-30 DIAGNOSIS — I10 Essential (primary) hypertension: Secondary | ICD-10-CM | POA: Insufficient documentation

## 2013-08-30 DIAGNOSIS — C50912 Malignant neoplasm of unspecified site of left female breast: Secondary | ICD-10-CM

## 2013-08-30 DIAGNOSIS — E669 Obesity, unspecified: Secondary | ICD-10-CM | POA: Insufficient documentation

## 2013-08-30 DIAGNOSIS — C7800 Secondary malignant neoplasm of unspecified lung: Secondary | ICD-10-CM | POA: Insufficient documentation

## 2013-08-30 DIAGNOSIS — F603 Borderline personality disorder: Secondary | ICD-10-CM | POA: Insufficient documentation

## 2013-08-30 DIAGNOSIS — C7802 Secondary malignant neoplasm of left lung: Secondary | ICD-10-CM

## 2013-08-30 DIAGNOSIS — Z09 Encounter for follow-up examination after completed treatment for conditions other than malignant neoplasm: Secondary | ICD-10-CM

## 2013-08-30 NOTE — Progress Notes (Signed)
  Echocardiogram 2D Echocardiogram has been performed.  [name] 08/30/2013, 10:28 AM

## 2013-09-05 ENCOUNTER — Ambulatory Visit (HOSPITAL_BASED_OUTPATIENT_CLINIC_OR_DEPARTMENT_OTHER): Payer: Medicare Other | Admitting: Physician Assistant

## 2013-09-05 ENCOUNTER — Telehealth: Payer: Self-pay | Admitting: Oncology

## 2013-09-05 ENCOUNTER — Other Ambulatory Visit (HOSPITAL_BASED_OUTPATIENT_CLINIC_OR_DEPARTMENT_OTHER): Payer: Medicare Other

## 2013-09-05 ENCOUNTER — Encounter: Payer: Self-pay | Admitting: Physician Assistant

## 2013-09-05 ENCOUNTER — Ambulatory Visit (HOSPITAL_BASED_OUTPATIENT_CLINIC_OR_DEPARTMENT_OTHER): Payer: Medicare Other

## 2013-09-05 VITALS — BP 181/99 | HR 78 | Temp 98.1°F | Resp 18 | Ht 63.0 in | Wt 324.9 lb

## 2013-09-05 DIAGNOSIS — C50919 Malignant neoplasm of unspecified site of unspecified female breast: Secondary | ICD-10-CM

## 2013-09-05 DIAGNOSIS — F411 Generalized anxiety disorder: Secondary | ICD-10-CM

## 2013-09-05 DIAGNOSIS — C50219 Malignant neoplasm of upper-inner quadrant of unspecified female breast: Secondary | ICD-10-CM

## 2013-09-05 DIAGNOSIS — R918 Other nonspecific abnormal finding of lung field: Secondary | ICD-10-CM

## 2013-09-05 DIAGNOSIS — R110 Nausea: Secondary | ICD-10-CM

## 2013-09-05 DIAGNOSIS — M48061 Spinal stenosis, lumbar region without neurogenic claudication: Secondary | ICD-10-CM

## 2013-09-05 DIAGNOSIS — C50212 Malignant neoplasm of upper-inner quadrant of left female breast: Secondary | ICD-10-CM

## 2013-09-05 DIAGNOSIS — C7800 Secondary malignant neoplasm of unspecified lung: Secondary | ICD-10-CM

## 2013-09-05 DIAGNOSIS — Z5112 Encounter for antineoplastic immunotherapy: Secondary | ICD-10-CM

## 2013-09-05 DIAGNOSIS — R0600 Dyspnea, unspecified: Secondary | ICD-10-CM

## 2013-09-05 DIAGNOSIS — I82629 Acute embolism and thrombosis of deep veins of unspecified upper extremity: Secondary | ICD-10-CM

## 2013-09-05 LAB — COMPREHENSIVE METABOLIC PANEL (CC13)
ALT: 53 U/L (ref 0–55)
AST: 30 U/L (ref 5–34)
Albumin: 3.3 g/dL — ABNORMAL LOW (ref 3.5–5.0)
Alkaline Phosphatase: 123 U/L (ref 40–150)
Anion Gap: 12 mEq/L — ABNORMAL HIGH (ref 3–11)
BUN: 18.9 mg/dL (ref 7.0–26.0)
CO2: 27 mEq/L (ref 22–29)
Calcium: 9.8 mg/dL (ref 8.4–10.4)
Chloride: 101 mEq/L (ref 98–109)
Creatinine: 0.8 mg/dL (ref 0.6–1.1)
Glucose: 123 mg/dl (ref 70–140)
Potassium: 4.3 mEq/L (ref 3.5–5.1)
Sodium: 141 mEq/L (ref 136–145)
Total Bilirubin: 0.27 mg/dL (ref 0.20–1.20)
Total Protein: 7.6 g/dL (ref 6.4–8.3)

## 2013-09-05 LAB — CBC WITH DIFFERENTIAL/PLATELET
BASO%: 0.5 % (ref 0.0–2.0)
Basophils Absolute: 0 10*3/uL (ref 0.0–0.1)
EOS%: 1.3 % (ref 0.0–7.0)
Eosinophils Absolute: 0.1 10*3/uL (ref 0.0–0.5)
HCT: 42.9 % (ref 34.8–46.6)
HGB: 14.2 g/dL (ref 11.6–15.9)
LYMPH%: 35.2 % (ref 14.0–49.7)
MCH: 27.6 pg (ref 25.1–34.0)
MCHC: 33.1 g/dL (ref 31.5–36.0)
MCV: 83.3 fL (ref 79.5–101.0)
MONO#: 0.6 10*3/uL (ref 0.1–0.9)
MONO%: 7.3 % (ref 0.0–14.0)
NEUT#: 4.5 10*3/uL (ref 1.5–6.5)
NEUT%: 55.7 % (ref 38.4–76.8)
Platelets: 315 10*3/uL (ref 145–400)
RBC: 5.15 10*6/uL (ref 3.70–5.45)
RDW: 14.8 % — ABNORMAL HIGH (ref 11.2–14.5)
WBC: 8 10*3/uL (ref 3.9–10.3)
lymph#: 2.8 10*3/uL (ref 0.9–3.3)
nRBC: 0 % (ref 0–0)

## 2013-09-05 MED ORDER — DIPHENHYDRAMINE HCL 25 MG PO CAPS
ORAL_CAPSULE | ORAL | Status: AC
  Filled 2013-09-05: qty 1

## 2013-09-05 MED ORDER — SODIUM CHLORIDE 0.9 % IV SOLN
Freq: Once | INTRAVENOUS | Status: AC
Start: 2013-09-05 — End: 2013-09-05
  Administered 2013-09-05: 16:00:00 via INTRAVENOUS

## 2013-09-05 MED ORDER — ACETAMINOPHEN 325 MG PO TABS
650.0000 mg | ORAL_TABLET | Freq: Once | ORAL | Status: AC
  Administered 2013-09-05: 650 mg via ORAL

## 2013-09-05 MED ORDER — METOCLOPRAMIDE HCL 5 MG/ML IJ SOLN
INTRAMUSCULAR | Status: AC
  Filled 2013-09-05: qty 2

## 2013-09-05 MED ORDER — LORAZEPAM 2 MG/ML IJ SOLN
INTRAMUSCULAR | Status: AC
  Filled 2013-09-05: qty 1

## 2013-09-05 MED ORDER — ACETAMINOPHEN 325 MG PO TABS
ORAL_TABLET | ORAL | Status: AC
  Filled 2013-09-05: qty 2

## 2013-09-05 MED ORDER — LORAZEPAM 2 MG/ML IJ SOLN
0.5000 mg | Freq: Once | INTRAMUSCULAR | Status: AC
  Administered 2013-09-05: 0.5 mg via INTRAVENOUS

## 2013-09-05 MED ORDER — DIPHENHYDRAMINE HCL 25 MG PO CAPS
25.0000 mg | ORAL_CAPSULE | Freq: Once | ORAL | Status: AC
  Administered 2013-09-05: 25 mg via ORAL

## 2013-09-05 MED ORDER — SODIUM CHLORIDE 0.9 % IV SOLN
420.0000 mg | Freq: Once | INTRAVENOUS | Status: AC
  Administered 2013-09-05: 420 mg via INTRAVENOUS
  Filled 2013-09-05: qty 14

## 2013-09-05 MED ORDER — HEPARIN SOD (PORK) LOCK FLUSH 100 UNIT/ML IV SOLN
500.0000 [IU] | Freq: Once | INTRAVENOUS | Status: AC | PRN
  Administered 2013-09-05: 500 [IU]
  Filled 2013-09-05: qty 5

## 2013-09-05 MED ORDER — SODIUM CHLORIDE 0.9 % IV SOLN
6.0000 mg/kg | Freq: Once | INTRAVENOUS | Status: AC
  Administered 2013-09-05: 903 mg via INTRAVENOUS
  Filled 2013-09-05: qty 43

## 2013-09-05 MED ORDER — SODIUM CHLORIDE 0.9 % IJ SOLN
10.0000 mL | INTRAMUSCULAR | Status: DC | PRN
  Administered 2013-09-05: 10 mL
  Filled 2013-09-05: qty 10

## 2013-09-05 MED ORDER — METOCLOPRAMIDE HCL 5 MG/ML IJ SOLN
10.0000 mg | Freq: Once | INTRAMUSCULAR | Status: AC
  Administered 2013-09-05: 10 mg via INTRAVENOUS
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
## 2013-09-05 NOTE — Patient Instructions (Signed)
[facility] Discharge Instructions for Patients Receiving Chemotherapy  Today you received the following chemotherapy agents perjeta, herceptin  To help prevent nausea and vomiting after your treatment, we encourage you to take your nausea medication as needed   If you develop nausea and vomiting that is not controlled by your nausea medication, call the clinic.   BELOW ARE SYMPTOMS THAT SHOULD BE REPORTED IMMEDIATELY:  *FEVER GREATER THAN 100.5 F  *CHILLS WITH OR WITHOUT FEVER  NAUSEA AND VOMITING THAT IS NOT CONTROLLED WITH YOUR NAUSEA MEDICATION  *UNUSUAL SHORTNESS OF BREATH  *UNUSUAL BRUISING OR BLEEDING  TENDERNESS IN MOUTH AND THROAT WITH OR WITHOUT PRESENCE OF ULCERS  *URINARY PROBLEMS  *BOWEL PROBLEMS  UNUSUAL RASH Items with * indicate a potential emergency and should be followed up as soon as possible.  Feel free to call the clinic you have any questions or concerns. The clinic phone number is [phone].

## 2013-09-06 ENCOUNTER — Telehealth: Payer: Self-pay | Admitting: *Deleted

## 2013-09-06 ENCOUNTER — Other Ambulatory Visit: Payer: Self-pay | Admitting: *Deleted

## 2013-09-06 NOTE — Telephone Encounter (Signed)
Per staff message and POF I have scheduled appts.  JMW  

## 2013-09-07 ENCOUNTER — Other Ambulatory Visit: Payer: Self-pay | Admitting: Physician Assistant

## 2013-09-07 ENCOUNTER — Telehealth: Payer: Self-pay | Admitting: Oncology

## 2013-09-07 DIAGNOSIS — C7800 Secondary malignant neoplasm of unspecified lung: Secondary | ICD-10-CM

## 2013-09-07 DIAGNOSIS — C50212 Malignant neoplasm of upper-inner quadrant of left female breast: Secondary | ICD-10-CM

## 2013-09-07 DIAGNOSIS — C50919 Malignant neoplasm of unspecified site of unspecified female breast: Secondary | ICD-10-CM

## 2013-09-07 NOTE — Telephone Encounter (Signed)
lmonvm for pt re appts for ct/mri/ech/bensimhon but then called back re change in ct location and was able to s/w pt directly. pt aware ct location is changed from what I left on vm. pt given ct for 4/1 @ 9:30am @ [facility] [address] pt aware she wil l need to call for mri (screening) and was given #. pt also aware heart clinic will call re echo/bensimhon. lmonvm for shantel re f/u echo/bensimhon for april. clinic will call pt. pt can get new schedule when she comes in 2/9. pt also has ct inst

## 2013-09-09 ENCOUNTER — Ambulatory Visit (HOSPITAL_COMMUNITY)
Admission: RE | Admit: 2013-09-09 | Discharge: 2013-09-09 | Disposition: A | Discharge status: Home / Self Care | Payer: Medicare Other | Source: Ambulatory Visit | Attending: Physician Assistant | Admitting: Physician Assistant

## 2013-09-09 DIAGNOSIS — R918 Other nonspecific abnormal finding of lung field: Secondary | ICD-10-CM | POA: Insufficient documentation

## 2013-09-09 DIAGNOSIS — C50212 Malignant neoplasm of upper-inner quadrant of left female breast: Secondary | ICD-10-CM

## 2013-09-09 DIAGNOSIS — C7800 Secondary malignant neoplasm of unspecified lung: Secondary | ICD-10-CM

## 2013-09-09 DIAGNOSIS — M47814 Spondylosis without myelopathy or radiculopathy, thoracic region: Secondary | ICD-10-CM | POA: Insufficient documentation

## 2013-09-09 DIAGNOSIS — C50919 Malignant neoplasm of unspecified site of unspecified female breast: Secondary | ICD-10-CM | POA: Insufficient documentation

## 2013-09-19 ENCOUNTER — Other Ambulatory Visit: Payer: Medicare Other

## 2013-09-24 ENCOUNTER — Other Ambulatory Visit: Payer: Self-pay | Admitting: Family Medicine

## 2013-09-26 ENCOUNTER — Other Ambulatory Visit (HOSPITAL_BASED_OUTPATIENT_CLINIC_OR_DEPARTMENT_OTHER): Payer: Medicare Other

## 2013-09-26 ENCOUNTER — Encounter (INDEPENDENT_AMBULATORY_CARE_PROVIDER_SITE_OTHER): Payer: Self-pay

## 2013-09-26 ENCOUNTER — Other Ambulatory Visit: Payer: Self-pay | Admitting: Cardiology

## 2013-09-26 ENCOUNTER — Ambulatory Visit (HOSPITAL_BASED_OUTPATIENT_CLINIC_OR_DEPARTMENT_OTHER): Payer: Medicare Other

## 2013-09-26 VITALS — BP 169/84 | HR 78 | Temp 98.0°F | Resp 20

## 2013-09-26 DIAGNOSIS — C50219 Malignant neoplasm of upper-inner quadrant of unspecified female breast: Secondary | ICD-10-CM

## 2013-09-26 DIAGNOSIS — C7800 Secondary malignant neoplasm of unspecified lung: Secondary | ICD-10-CM

## 2013-09-26 DIAGNOSIS — Z5112 Encounter for antineoplastic immunotherapy: Secondary | ICD-10-CM

## 2013-09-26 DIAGNOSIS — C50919 Malignant neoplasm of unspecified site of unspecified female breast: Secondary | ICD-10-CM

## 2013-09-26 DIAGNOSIS — C50212 Malignant neoplasm of upper-inner quadrant of left female breast: Secondary | ICD-10-CM

## 2013-09-26 LAB — COMPREHENSIVE METABOLIC PANEL (CC13)
ALT: 40 U/L (ref 0–55)
AST: 21 U/L (ref 5–34)
Albumin: 3.1 g/dL — ABNORMAL LOW (ref 3.5–5.0)
Alkaline Phosphatase: 108 U/L (ref 40–150)
Anion Gap: 8 mEq/L (ref 3–11)
BUN: 19.3 mg/dL (ref 7.0–26.0)
CO2: 30 mEq/L — ABNORMAL HIGH (ref 22–29)
Calcium: 9.8 mg/dL (ref 8.4–10.4)
Chloride: 103 mEq/L (ref 98–109)
Creatinine: 0.8 mg/dL (ref 0.6–1.1)
Glucose: 203 mg/dl — ABNORMAL HIGH (ref 70–140)
Potassium: 4.2 mEq/L (ref 3.5–5.1)
Sodium: 141 mEq/L (ref 136–145)
Total Bilirubin: 0.2 mg/dL (ref 0.20–1.20)
Total Protein: 7.1 g/dL (ref 6.4–8.3)

## 2013-09-26 LAB — CBC WITH DIFFERENTIAL/PLATELET
BASO%: 0.3 % (ref 0.0–2.0)
Basophils Absolute: 0 10*3/uL (ref 0.0–0.1)
EOS%: 1.9 % (ref 0.0–7.0)
Eosinophils Absolute: 0.1 10*3/uL (ref 0.0–0.5)
HCT: 41 % (ref 34.8–46.6)
HGB: 13.5 g/dL (ref 11.6–15.9)
LYMPH%: 36 % (ref 14.0–49.7)
MCH: 27.4 pg (ref 25.1–34.0)
MCHC: 32.9 g/dL (ref 31.5–36.0)
MCV: 83.2 fL (ref 79.5–101.0)
MONO#: 0.4 10*3/uL (ref 0.1–0.9)
MONO%: 6.4 % (ref 0.0–14.0)
NEUT#: 3.5 10*3/uL (ref 1.5–6.5)
NEUT%: 55.4 % (ref 38.4–76.8)
Platelets: 255 10*3/uL (ref 145–400)
RBC: 4.93 10*6/uL (ref 3.70–5.45)
RDW: 14.7 % — ABNORMAL HIGH (ref 11.2–14.5)
WBC: 6.3 10*3/uL (ref 3.9–10.3)
lymph#: 2.3 10*3/uL (ref 0.9–3.3)
nRBC: 0 % (ref 0–0)

## 2013-09-26 MED ORDER — DIPHENHYDRAMINE HCL 25 MG PO CAPS
25.0000 mg | ORAL_CAPSULE | Freq: Once | ORAL | Status: AC
  Administered 2013-09-26: 25 mg via ORAL

## 2013-09-26 MED ORDER — LORAZEPAM 2 MG/ML IJ SOLN
0.5000 mg | Freq: Once | INTRAMUSCULAR | Status: AC
  Administered 2013-09-26: 0.5 mg via INTRAVENOUS

## 2013-09-26 MED ORDER — TRASTUZUMAB CHEMO INJECTION 440 MG
6.0000 mg/kg | Freq: Once | INTRAVENOUS | Status: AC
  Administered 2013-09-26: 903 mg via INTRAVENOUS
  Filled 2013-09-26: qty 43

## 2013-09-26 MED ORDER — SODIUM CHLORIDE 0.9 % IV SOLN
Freq: Once | INTRAVENOUS | Status: AC
Start: 2013-09-26 — End: 2013-09-26
  Administered 2013-09-26: 10:00:00 via INTRAVENOUS

## 2013-09-26 MED ORDER — HEPARIN SOD (PORK) LOCK FLUSH 100 UNIT/ML IV SOLN
500.0000 [IU] | Freq: Once | INTRAVENOUS | Status: AC | PRN
  Administered 2013-09-26: 500 [IU]
  Filled 2013-09-26: qty 5

## 2013-09-26 MED ORDER — ACETAMINOPHEN 325 MG PO TABS
650.0000 mg | ORAL_TABLET | Freq: Once | ORAL | Status: AC
  Administered 2013-09-26: 650 mg via ORAL

## 2013-09-26 MED ORDER — DIPHENHYDRAMINE HCL 25 MG PO CAPS
ORAL_CAPSULE | ORAL | Status: AC
  Filled 2013-09-26: qty 1

## 2013-09-26 MED ORDER — SODIUM CHLORIDE 0.9 % IJ SOLN
10.0000 mL | INTRAMUSCULAR | Status: DC | PRN
  Administered 2013-09-26: 10 mL
  Filled 2013-09-26: qty 10

## 2013-09-26 MED ORDER — ACETAMINOPHEN 325 MG PO TABS
ORAL_TABLET | ORAL | Status: AC
  Filled 2013-09-26: qty 2

## 2013-09-26 MED ORDER — LORAZEPAM 2 MG/ML IJ SOLN
INTRAMUSCULAR | Status: AC
  Filled 2013-09-26: qty 1

## 2013-09-26 MED ORDER — SODIUM CHLORIDE 0.9 % IV SOLN
420.0000 mg | Freq: Once | INTRAVENOUS | Status: AC
  Administered 2013-09-26: 420 mg via INTRAVENOUS
  Filled 2013-09-26: qty 14

## 2013-09-26 NOTE — Patient Instructions (Signed)
Trastuzumab injection for infusion What is this medicine? TRASTUZUMAB (tras TOO zoo mab) is a monoclonal antibody. It targets a protein called HER2. This protein is found in some stomach and breast cancers. This medicine can stop cancer cell growth. This medicine may be used with other cancer treatments. This medicine may be used for other purposes; ask your health care provider or pharmacist if you have questions. COMMON BRAND NAME(S): Herceptin What should I tell my health care provider before I take this medicine? They need to know if you have any of these conditions: -heart disease -heart failure -infection (especially a virus infection such as chickenpox, cold sores, or herpes) -lung or breathing disease, like asthma -recent or ongoing radiation therapy -an unusual or allergic reaction to trastuzumab, benzyl alcohol, or other medications, foods, dyes, or preservatives -pregnant or trying to get pregnant -breast-feeding How should I use this medicine? This drug is given as an infusion into a vein. It is administered in a hospital or clinic by a specially trained health care professional. Talk to your pediatrician regarding the use of this medicine in children. This medicine is not approved for use in children. Overdosage: If you think you have taken too much of this medicine contact a poison control center or emergency room at once. NOTE: This medicine is only for you. Do not share this medicine with others. What if I miss a dose? It is important not to miss a dose. Call your doctor or health care professional if you are unable to keep an appointment. What may interact with this medicine? -cyclophosphamide -doxorubicin -warfarin This list may not describe all possible interactions. Give your health care provider a list of all the medicines, herbs, non-prescription drugs, or dietary supplements you use. Also tell them if you smoke, drink alcohol, or use illegal drugs. Some items may  interact with your medicine. What should I watch for while using this medicine? Visit your doctor for checks on your progress. Report any side effects. Continue your course of treatment even though you feel ill unless your doctor tells you to stop. Call your doctor or health care professional for advice if you get a fever, chills or sore throat, or other symptoms of a cold or flu. Do not treat yourself. Try to avoid being around people who are sick. You may experience fever, chills and shaking during your first infusion. These effects are usually mild and can be treated with other medicines. Report any side effects during the infusion to your health care professional. Fever and chills usually do not happen with later infusions. What side effects may I notice from receiving this medicine? Side effects that you should report to your doctor or other health care professional as soon as possible: -breathing difficulties -chest pain or palpitations -cough -dizziness or fainting -fever or chills, sore throat -skin rash, itching or hives -swelling of the legs or ankles -unusually weak or tired Side effects that usually do not require medical attention (report to your doctor or other health care professional if they continue or are bothersome): -loss of appetite -headache -muscle aches -nausea This list may not describe all possible side effects. Call your doctor for medical advice about side effects. You may report side effects to FDA at 1-800-FDA-1088. Where should I keep my medicine? This drug is given in a hospital or clinic and will not be stored at home. NOTE: This sheet is a summary. It may not cover all possible information. If you have questions about this medicine, talk   to your doctor, pharmacist, or health care provider.  2014, Elsevier/Gold Standard. (2009-06-08 13:43:15) Pertuzumab injection What is this medicine? PERTUZUMAB (per TOOZ ue mab) is a monoclonal antibody that targets a  protein called HER2. HER2 is found in some breast cancers. This medicine can stop cancer cell growth. This medicine is used with other cancer treatments. This medicine may be used for other purposes; ask your health care provider or pharmacist if you have questions. COMMON BRAND NAME(S): PERJETA What should I tell my health care provider before I take this medicine? They need to know if you have any of these conditions: -heart disease -heart failure -high blood pressure -history of irregular heart beat -recent or ongoing radiation therapy -an unusual or allergic reaction to pertuzumab, other medicines, foods, dyes, or preservatives -pregnant or trying to get pregnant -breast-feeding How should I use this medicine? This medicine is for infusion into a vein. It is given by a health care professional in a hospital or clinic setting. Talk to your pediatrician regarding the use of this medicine in children. Special care may be needed. Overdosage: If you think you've taken too much of this medicine contact a poison control center or emergency room at once. Overdosage: If you think you have taken too much of this medicine contact a poison control center or emergency room at once. NOTE: This medicine is only for you. Do not share this medicine with others. What if I miss a dose? It is important not to miss your dose. Call your doctor or health care professional if you are unable to keep an appointment. What may interact with this medicine? Interactions are not expected. Give your health care provider a list of all the medicines, herbs, non-prescription drugs, or dietary supplements you use. Also tell them if you smoke, drink alcohol, or use illegal drugs. Some items may interact with your medicine. This list may not describe all possible interactions. Give your health care provider a list of all the medicines, herbs, non-prescription drugs, or dietary supplements you use. Also tell them if you smoke,  drink alcohol, or use illegal drugs. Some items may interact with your medicine. What should I watch for while using this medicine? Your condition will be monitored carefully while you are receiving this medicine. Report any side effects. Continue your course of treatment even though you feel ill unless your doctor tells you to stop. Do not become pregnant while taking this medicine. Women should inform their doctor if they wish to become pregnant or think they might be pregnant. There is a potential for serious side effects to an unborn child. Talk to your health care professional or pharmacist for more information. Do not breast-feed an infant while taking this medicine. Call your doctor or health care professional for advice if you get a fever, chills or sore throat, or other symptoms of a cold or flu. Do not treat yourself. Try to avoid being around people who are sick. You may experience fever, chills, and headache during the infusion. Report any side effects during the infusion to your health care professional. What side effects may I notice from receiving this medicine? Side effects that you should report to your doctor or health care professional as soon as possible: -breathing problems -chest pain or palpitations -dizziness -feeling faint or lightheaded -fever or chills -skin rash, itching or hives -sore throat -swelling of the face, lips, or tongue -swelling of the legs or ankles -unusually weak or tired  Side effects that usually do not  require medical attention (Report these to your doctor or health care professional if they continue or are bothersome.): -diarrhea -hair loss -nausea, vomiting -tiredness This list may not describe all possible side effects. Call your doctor for medical advice about side effects. You may report side effects to FDA at 1-800-FDA-1088. Where should I keep my medicine? This drug is given in a hospital or clinic and will not be stored at home. NOTE:  This sheet is a summary. It may not cover all possible information. If you have questions about this medicine, talk to your doctor, pharmacist, or health care provider.  2014, Elsevier/Gold Standard. (2012-06-02 16:54:15)

## 2013-10-17 ENCOUNTER — Other Ambulatory Visit (HOSPITAL_BASED_OUTPATIENT_CLINIC_OR_DEPARTMENT_OTHER): Payer: Medicare Other

## 2013-10-17 ENCOUNTER — Ambulatory Visit (HOSPITAL_BASED_OUTPATIENT_CLINIC_OR_DEPARTMENT_OTHER): Payer: Medicare Other

## 2013-10-17 ENCOUNTER — Encounter (INDEPENDENT_AMBULATORY_CARE_PROVIDER_SITE_OTHER): Payer: Self-pay

## 2013-10-17 VITALS — BP 172/81 | HR 81 | Temp 98.0°F | Resp 20

## 2013-10-17 DIAGNOSIS — C7800 Secondary malignant neoplasm of unspecified lung: Secondary | ICD-10-CM

## 2013-10-17 DIAGNOSIS — C50919 Malignant neoplasm of unspecified site of unspecified female breast: Secondary | ICD-10-CM

## 2013-10-17 DIAGNOSIS — C50219 Malignant neoplasm of upper-inner quadrant of unspecified female breast: Secondary | ICD-10-CM

## 2013-10-17 DIAGNOSIS — C50212 Malignant neoplasm of upper-inner quadrant of left female breast: Secondary | ICD-10-CM

## 2013-10-17 DIAGNOSIS — Z5112 Encounter for antineoplastic immunotherapy: Secondary | ICD-10-CM

## 2013-10-17 LAB — COMPREHENSIVE METABOLIC PANEL (CC13)
ALT: 45 U/L (ref 0–55)
AST: 24 U/L (ref 5–34)
Albumin: 2.9 g/dL — ABNORMAL LOW (ref 3.5–5.0)
Alkaline Phosphatase: 106 U/L (ref 40–150)
Anion Gap: 7 mEq/L (ref 3–11)
BUN: 16.3 mg/dL (ref 7.0–26.0)
CO2: 28 mEq/L (ref 22–29)
Calcium: 9.5 mg/dL (ref 8.4–10.4)
Chloride: 105 mEq/L (ref 98–109)
Creatinine: 0.8 mg/dL (ref 0.6–1.1)
Glucose: 229 mg/dl — ABNORMAL HIGH (ref 70–140)
Potassium: 3.9 mEq/L (ref 3.5–5.1)
Sodium: 140 mEq/L (ref 136–145)
Total Bilirubin: 0.26 mg/dL (ref 0.20–1.20)
Total Protein: 7 g/dL (ref 6.4–8.3)

## 2013-10-17 LAB — CBC WITH DIFFERENTIAL/PLATELET
BASO%: 0.6 % (ref 0.0–2.0)
Basophils Absolute: 0 10*3/uL (ref 0.0–0.1)
EOS%: 1.2 % (ref 0.0–7.0)
Eosinophils Absolute: 0.1 10*3/uL (ref 0.0–0.5)
HCT: 40.9 % (ref 34.8–46.6)
HGB: 13.4 g/dL (ref 11.6–15.9)
LYMPH%: 32.4 % (ref 14.0–49.7)
MCH: 27.2 pg (ref 25.1–34.0)
MCHC: 32.8 g/dL (ref 31.5–36.0)
MCV: 83.1 fL (ref 79.5–101.0)
MONO#: 0.4 10*3/uL (ref 0.1–0.9)
MONO%: 5.9 % (ref 0.0–14.0)
NEUT#: 3.9 10*3/uL (ref 1.5–6.5)
NEUT%: 59.9 % (ref 38.4–76.8)
Platelets: 257 10*3/uL (ref 145–400)
RBC: 4.92 10*6/uL (ref 3.70–5.45)
RDW: 14.5 % (ref 11.2–14.5)
WBC: 6.5 10*3/uL (ref 3.9–10.3)
lymph#: 2.1 10*3/uL (ref 0.9–3.3)
nRBC: 0 % (ref 0–0)

## 2013-10-17 MED ORDER — ACETAMINOPHEN 325 MG PO TABS
ORAL_TABLET | ORAL | Status: AC
  Filled 2013-10-17: qty 2

## 2013-10-17 MED ORDER — SODIUM CHLORIDE 0.9 % IV SOLN
420.0000 mg | Freq: Once | INTRAVENOUS | Status: AC
  Administered 2013-10-17: 420 mg via INTRAVENOUS
  Filled 2013-10-17: qty 14

## 2013-10-17 MED ORDER — LORAZEPAM 2 MG/ML IJ SOLN
INTRAMUSCULAR | Status: AC
  Filled 2013-10-17: qty 1

## 2013-10-17 MED ORDER — SODIUM CHLORIDE 0.9 % IJ SOLN
10.0000 mL | INTRAMUSCULAR | Status: DC | PRN
  Administered 2013-10-17: 10 mL
  Filled 2013-10-17: qty 10

## 2013-10-17 MED ORDER — LORAZEPAM 2 MG/ML IJ SOLN
0.5000 mg | Freq: Once | INTRAMUSCULAR | Status: AC
  Administered 2013-10-17: 0.5 mg via INTRAVENOUS

## 2013-10-17 MED ORDER — ACETAMINOPHEN 325 MG PO TABS
650.0000 mg | ORAL_TABLET | Freq: Once | ORAL | Status: AC
Start: 2013-10-17 — End: 2013-10-17
  Administered 2013-10-17: 650 mg via ORAL

## 2013-10-17 MED ORDER — DIPHENHYDRAMINE HCL 25 MG PO CAPS
ORAL_CAPSULE | ORAL | Status: AC
  Filled 2013-10-17: qty 1

## 2013-10-17 MED ORDER — SODIUM CHLORIDE 0.9 % IV SOLN
Freq: Once | INTRAVENOUS | Status: AC
  Administered 2013-10-17: 10:00:00 via INTRAVENOUS

## 2013-10-17 MED ORDER — HEPARIN SOD (PORK) LOCK FLUSH 100 UNIT/ML IV SOLN
500.0000 [IU] | Freq: Once | INTRAVENOUS | Status: AC | PRN
  Administered 2013-10-17: 500 [IU]
  Filled 2013-10-17: qty 5

## 2013-10-17 MED ORDER — DIPHENHYDRAMINE HCL 25 MG PO CAPS
25.0000 mg | ORAL_CAPSULE | Freq: Once | ORAL | Status: AC
  Administered 2013-10-17: 25 mg via ORAL

## 2013-10-17 MED ORDER — TRASTUZUMAB CHEMO INJECTION 440 MG
6.0000 mg/kg | Freq: Once | INTRAVENOUS | Status: AC
  Administered 2013-10-17: 903 mg via INTRAVENOUS
  Filled 2013-10-17: qty 43

## 2013-10-17 NOTE — Patient Instructions (Signed)
[facility] Discharge Instructions for Patients Receiving Chemotherapy  Today you received the following chemotherapy agents herceptin/perjeta.   To help prevent nausea and vomiting after your treatment, we encourage you to take your nausea medication as directed.     If you develop nausea and vomiting that is not controlled by your nausea medication, call the clinic.   BELOW ARE SYMPTOMS THAT SHOULD BE REPORTED IMMEDIATELY:  *FEVER GREATER THAN 100.5 F  *CHILLS WITH OR WITHOUT FEVER  NAUSEA AND VOMITING THAT IS NOT CONTROLLED WITH YOUR NAUSEA MEDICATION  *UNUSUAL SHORTNESS OF BREATH  *UNUSUAL BRUISING OR BLEEDING  TENDERNESS IN MOUTH AND THROAT WITH OR WITHOUT PRESENCE OF ULCERS  *URINARY PROBLEMS  *BOWEL PROBLEMS  UNUSUAL RASH Items with * indicate a potential emergency and should be followed up as soon as possible.  Feel free to call the clinic you have any questions or concerns. The clinic phone number is [phone].

## 2013-11-07 ENCOUNTER — Other Ambulatory Visit: Payer: Self-pay | Admitting: Oncology

## 2013-11-07 ENCOUNTER — Other Ambulatory Visit (HOSPITAL_BASED_OUTPATIENT_CLINIC_OR_DEPARTMENT_OTHER): Payer: Medicare Other

## 2013-11-07 ENCOUNTER — Ambulatory Visit (HOSPITAL_BASED_OUTPATIENT_CLINIC_OR_DEPARTMENT_OTHER): Payer: Medicare Other

## 2013-11-07 VITALS — BP 162/81 | HR 77 | Temp 97.2°F | Resp 20

## 2013-11-07 DIAGNOSIS — C50919 Malignant neoplasm of unspecified site of unspecified female breast: Secondary | ICD-10-CM

## 2013-11-07 DIAGNOSIS — C50212 Malignant neoplasm of upper-inner quadrant of left female breast: Secondary | ICD-10-CM

## 2013-11-07 DIAGNOSIS — C50219 Malignant neoplasm of upper-inner quadrant of unspecified female breast: Secondary | ICD-10-CM

## 2013-11-07 DIAGNOSIS — I82629 Acute embolism and thrombosis of deep veins of unspecified upper extremity: Secondary | ICD-10-CM

## 2013-11-07 DIAGNOSIS — C7800 Secondary malignant neoplasm of unspecified lung: Secondary | ICD-10-CM

## 2013-11-07 DIAGNOSIS — Z5112 Encounter for antineoplastic immunotherapy: Secondary | ICD-10-CM

## 2013-11-07 LAB — CBC WITH DIFFERENTIAL/PLATELET
BASO%: 0.1 % (ref 0.0–2.0)
Basophils Absolute: 0 10*3/uL (ref 0.0–0.1)
EOS%: 1 % (ref 0.0–7.0)
Eosinophils Absolute: 0.1 10*3/uL (ref 0.0–0.5)
HCT: 41.8 % (ref 34.8–46.6)
HGB: 13.8 g/dL (ref 11.6–15.9)
LYMPH%: 36 % (ref 14.0–49.7)
MCH: 27.2 pg (ref 25.1–34.0)
MCHC: 33 g/dL (ref 31.5–36.0)
MCV: 82.4 fL (ref 79.5–101.0)
MONO#: 0.4 10*3/uL (ref 0.1–0.9)
MONO%: 6.2 % (ref 0.0–14.0)
NEUT#: 3.9 10*3/uL (ref 1.5–6.5)
NEUT%: 56.7 % (ref 38.4–76.8)
Platelets: 258 10*3/uL (ref 145–400)
RBC: 5.07 10*6/uL (ref 3.70–5.45)
RDW: 14.9 % — ABNORMAL HIGH (ref 11.2–14.5)
WBC: 6.8 10*3/uL (ref 3.9–10.3)
lymph#: 2.5 10*3/uL (ref 0.9–3.3)
nRBC: 0 % (ref 0–0)

## 2013-11-07 LAB — COMPREHENSIVE METABOLIC PANEL (CC13)
ALT: 49 U/L (ref 0–55)
AST: 25 U/L (ref 5–34)
Albumin: 3.1 g/dL — ABNORMAL LOW (ref 3.5–5.0)
Alkaline Phosphatase: 115 U/L (ref 40–150)
Anion Gap: 11 mEq/L (ref 3–11)
BUN: 18.7 mg/dL (ref 7.0–26.0)
CO2: 27 mEq/L (ref 22–29)
Calcium: 9.8 mg/dL (ref 8.4–10.4)
Chloride: 103 mEq/L (ref 98–109)
Creatinine: 0.8 mg/dL (ref 0.6–1.1)
Glucose: 206 mg/dl — ABNORMAL HIGH (ref 70–140)
Potassium: 4 mEq/L (ref 3.5–5.1)
Sodium: 141 mEq/L (ref 136–145)
Total Bilirubin: 0.26 mg/dL (ref 0.20–1.20)
Total Protein: 7.3 g/dL (ref 6.4–8.3)

## 2013-11-07 MED ORDER — DIPHENHYDRAMINE HCL 25 MG PO CAPS
25.0000 mg | ORAL_CAPSULE | Freq: Once | ORAL | Status: AC
  Administered 2013-11-07: 25 mg via ORAL

## 2013-11-07 MED ORDER — SODIUM CHLORIDE 0.9 % IV SOLN
Freq: Once | INTRAVENOUS | Status: AC
  Administered 2013-11-07: 10:00:00 via INTRAVENOUS

## 2013-11-07 MED ORDER — LORAZEPAM 2 MG/ML IJ SOLN
INTRAMUSCULAR | Status: AC
  Filled 2013-11-07: qty 1

## 2013-11-07 MED ORDER — DIPHENHYDRAMINE HCL 25 MG PO CAPS
ORAL_CAPSULE | ORAL | Status: AC
  Filled 2013-11-07: qty 1

## 2013-11-07 MED ORDER — SODIUM CHLORIDE 0.9 % IV SOLN
420.0000 mg | Freq: Once | INTRAVENOUS | Status: AC
  Administered 2013-11-07: 420 mg via INTRAVENOUS
  Filled 2013-11-07: qty 14

## 2013-11-07 MED ORDER — LORAZEPAM 2 MG/ML IJ SOLN
0.5000 mg | Freq: Once | INTRAMUSCULAR | Status: AC
  Administered 2013-11-07: 0.5 mg via INTRAVENOUS

## 2013-11-07 MED ORDER — TRASTUZUMAB CHEMO INJECTION 440 MG
6.0000 mg/kg | Freq: Once | INTRAVENOUS | Status: AC
  Administered 2013-11-07: 903 mg via INTRAVENOUS
  Filled 2013-11-07: qty 43

## 2013-11-07 MED ORDER — SODIUM CHLORIDE 0.9 % IJ SOLN
10.0000 mL | INTRAMUSCULAR | Status: DC | PRN
  Administered 2013-11-07: 10 mL
  Filled 2013-11-07: qty 10

## 2013-11-07 MED ORDER — ACETAMINOPHEN 325 MG PO TABS
650.0000 mg | ORAL_TABLET | Freq: Once | ORAL | Status: AC
  Administered 2013-11-07: 650 mg via ORAL

## 2013-11-07 MED ORDER — ACETAMINOPHEN 325 MG PO TABS
ORAL_TABLET | ORAL | Status: AC
  Filled 2013-11-07: qty 2

## 2013-11-07 MED ORDER — HEPARIN SOD (PORK) LOCK FLUSH 100 UNIT/ML IV SOLN
500.0000 [IU] | Freq: Once | INTRAVENOUS | Status: AC | PRN
  Administered 2013-11-07: 500 [IU]
  Filled 2013-11-07: qty 5

## 2013-11-07 NOTE — Patient Instructions (Signed)
[facility] Discharge Instructions for Patients Receiving Chemotherapy  Today you received the following chemotherapy agents: Herceptin, Perjeta  To help prevent nausea and vomiting after your treatment, we encourage you to take your nausea medication: Zofran 8 mg every 12 hrs as needed.    If you develop nausea and vomiting that is not controlled by your nausea medication, call the clinic.   BELOW ARE SYMPTOMS THAT SHOULD BE REPORTED IMMEDIATELY:  *FEVER GREATER THAN 100.5 F  *CHILLS WITH OR WITHOUT FEVER  NAUSEA AND VOMITING THAT IS NOT CONTROLLED WITH YOUR NAUSEA MEDICATION  *UNUSUAL SHORTNESS OF BREATH  *UNUSUAL BRUISING OR BLEEDING  TENDERNESS IN MOUTH AND THROAT WITH OR WITHOUT PRESENCE OF ULCERS  *URINARY PROBLEMS  *BOWEL PROBLEMS  UNUSUAL RASH Items with * indicate a potential emergency and should be followed up as soon as possible.  Feel free to call the clinic you have any questions or concerns. The clinic phone number is [phone].

## 2013-11-08 ENCOUNTER — Other Ambulatory Visit: Payer: Self-pay

## 2013-11-08 MED ORDER — CARVEDILOL 12.5 MG PO TABS: ORAL_TABLET | ORAL | Status: DC

## 2013-11-11 ENCOUNTER — Encounter: Payer: Self-pay | Admitting: *Deleted

## 2013-11-11 NOTE — Progress Notes (Signed)
11/11/13 Late entry for 3rd quarter PROs Mrs. Allbright was in the cancer center on 10/17/13 for infusion therapy. Before therapy, She independently completed the PROs for the SystHER trial on the iPad provided for the study.  She is aware I will see her again in 3 months to complete another set of questionnaires.

## 2013-11-16 ENCOUNTER — Ambulatory Visit
Admission: RE | Admit: 2013-11-16 | Discharge: 2013-11-16 | Disposition: A | Discharge status: Home / Self Care | Payer: Medicare Other | Source: Ambulatory Visit | Attending: Physician Assistant | Admitting: Physician Assistant

## 2013-11-16 ENCOUNTER — Other Ambulatory Visit: Payer: Medicare Other

## 2013-11-16 DIAGNOSIS — C50919 Malignant neoplasm of unspecified site of unspecified female breast: Secondary | ICD-10-CM

## 2013-11-16 DIAGNOSIS — C50212 Malignant neoplasm of upper-inner quadrant of left female breast: Secondary | ICD-10-CM

## 2013-11-16 DIAGNOSIS — C7800 Secondary malignant neoplasm of unspecified lung: Secondary | ICD-10-CM

## 2013-11-16 LAB — HM MAMMOGRAPHY

## 2013-11-16 MED ORDER — GADOBENATE DIMEGLUMINE 529 MG/ML IV SOLN
20.0000 mL | Freq: Once | INTRAVENOUS | Status: AC | PRN
  Administered 2013-11-16: 20 mL via INTRAVENOUS

## 2013-11-17 ENCOUNTER — Telehealth: Payer: Self-pay | Admitting: Oncology

## 2013-11-17 NOTE — Telephone Encounter (Signed)
, °

## 2013-11-18 ENCOUNTER — Other Ambulatory Visit: Payer: Self-pay | Admitting: *Deleted

## 2013-11-18 ENCOUNTER — Telehealth: Payer: Self-pay | Admitting: *Deleted

## 2013-11-18 NOTE — Telephone Encounter (Signed)
Returned pt's phone call concerning her need to reschedule the CT scan that was ordered. She will call [phone] to set scan up to be done at [facility] instead of [facility]. I have cancelled her appt with Dr. Jana Hakim on 11/22/13 because he will not have any results to be reviewed with pt. Pt is aware of this. I told her to call us once she has  the scan so we can r/s her appt to be seen here in the office.Pt verbalized understanding.

## 2013-11-21 ENCOUNTER — Other Ambulatory Visit: Payer: Self-pay | Admitting: Physician Assistant

## 2013-11-21 ENCOUNTER — Other Ambulatory Visit: Payer: Self-pay | Admitting: Oncology

## 2013-11-21 DIAGNOSIS — C50919 Malignant neoplasm of unspecified site of unspecified female breast: Secondary | ICD-10-CM

## 2013-11-21 DIAGNOSIS — C7800 Secondary malignant neoplasm of unspecified lung: Principal | ICD-10-CM

## 2013-11-22 ENCOUNTER — Telehealth: Payer: Self-pay | Admitting: Physician Assistant

## 2013-11-22 ENCOUNTER — Ambulatory Visit: Payer: Medicare Other | Admitting: Oncology

## 2013-11-22 ENCOUNTER — Other Ambulatory Visit: Payer: Medicare Other

## 2013-11-22 NOTE — Telephone Encounter (Signed)
, °

## 2013-11-23 ENCOUNTER — Other Ambulatory Visit: Payer: Self-pay

## 2013-11-23 ENCOUNTER — Telehealth: Payer: Self-pay | Admitting: *Deleted

## 2013-11-23 NOTE — Telephone Encounter (Signed)
I ave adjusted appt

## 2013-11-24 ENCOUNTER — Telehealth: Payer: Self-pay | Admitting: Physician Assistant

## 2013-11-24 NOTE — Telephone Encounter (Signed)
, °

## 2013-11-25 ENCOUNTER — Telehealth: Payer: Self-pay | Admitting: Physician Assistant

## 2013-11-28 ENCOUNTER — Ambulatory Visit (HOSPITAL_BASED_OUTPATIENT_CLINIC_OR_DEPARTMENT_OTHER): Payer: Medicare Other | Admitting: *Deleted

## 2013-11-28 ENCOUNTER — Telehealth (HOSPITAL_COMMUNITY): Payer: Self-pay | Admitting: Cardiology

## 2013-11-28 ENCOUNTER — Ambulatory Visit: Payer: Medicare Other | Admitting: Physician Assistant

## 2013-11-28 ENCOUNTER — Other Ambulatory Visit: Payer: Medicare Other

## 2013-11-28 ENCOUNTER — Ambulatory Visit (HOSPITAL_BASED_OUTPATIENT_CLINIC_OR_DEPARTMENT_OTHER): Payer: Medicare Other

## 2013-11-28 VITALS — BP 126/84 | HR 71 | Temp 98.5°F | Resp 20

## 2013-11-28 DIAGNOSIS — C50919 Malignant neoplasm of unspecified site of unspecified female breast: Secondary | ICD-10-CM

## 2013-11-28 DIAGNOSIS — Z5112 Encounter for antineoplastic immunotherapy: Secondary | ICD-10-CM

## 2013-11-28 DIAGNOSIS — C7800 Secondary malignant neoplasm of unspecified lung: Principal | ICD-10-CM

## 2013-11-28 DIAGNOSIS — C50212 Malignant neoplasm of upper-inner quadrant of left female breast: Secondary | ICD-10-CM

## 2013-11-28 DIAGNOSIS — C50219 Malignant neoplasm of upper-inner quadrant of unspecified female breast: Secondary | ICD-10-CM

## 2013-11-28 LAB — CBC WITH DIFFERENTIAL/PLATELET
BASO%: 0.8 % (ref 0.0–2.0)
Basophils Absolute: 0.1 10*3/uL (ref 0.0–0.1)
EOS%: 1.7 % (ref 0.0–7.0)
Eosinophils Absolute: 0.1 10*3/uL (ref 0.0–0.5)
HCT: 41 % (ref 34.8–46.6)
HGB: 13.5 g/dL (ref 11.6–15.9)
LYMPH%: 31.3 % (ref 14.0–49.7)
MCH: 27.3 pg (ref 25.1–34.0)
MCHC: 32.9 g/dL (ref 31.5–36.0)
MCV: 83 fL (ref 79.5–101.0)
MONO#: 0.4 10*3/uL (ref 0.1–0.9)
MONO%: 5.6 % (ref 0.0–14.0)
NEUT#: 4.3 10*3/uL (ref 1.5–6.5)
NEUT%: 60.6 % (ref 38.4–76.8)
Platelets: 255 10*3/uL (ref 145–400)
RBC: 4.93 10*6/uL (ref 3.70–5.45)
RDW: 15.9 % — ABNORMAL HIGH (ref 11.2–14.5)
WBC: 7 10*3/uL (ref 3.9–10.3)
lymph#: 2.2 10*3/uL (ref 0.9–3.3)

## 2013-11-28 LAB — COMPREHENSIVE METABOLIC PANEL (CC13)
ALT: 50 U/L (ref 0–55)
AST: 30 U/L (ref 5–34)
Albumin: 3.1 g/dL — ABNORMAL LOW (ref 3.5–5.0)
Alkaline Phosphatase: 117 U/L (ref 40–150)
Anion Gap: 10 mEq/L (ref 3–11)
BUN: 16.6 mg/dL (ref 7.0–26.0)
CO2: 27 mEq/L (ref 22–29)
Calcium: 9.5 mg/dL (ref 8.4–10.4)
Chloride: 104 mEq/L (ref 98–109)
Creatinine: 0.8 mg/dL (ref 0.6–1.1)
Glucose: 209 mg/dl — ABNORMAL HIGH (ref 70–140)
Potassium: 3.9 mEq/L (ref 3.5–5.1)
Sodium: 141 mEq/L (ref 136–145)
Total Bilirubin: 0.3 mg/dL (ref 0.20–1.20)
Total Protein: 7.2 g/dL (ref 6.4–8.3)

## 2013-11-28 MED ORDER — DIPHENHYDRAMINE HCL 25 MG PO CAPS
25.0000 mg | ORAL_CAPSULE | Freq: Once | ORAL | Status: AC
  Administered 2013-11-28: 25 mg via ORAL

## 2013-11-28 MED ORDER — SODIUM CHLORIDE 0.9 % IV SOLN
420.0000 mg | Freq: Once | INTRAVENOUS | Status: AC
  Administered 2013-11-28: 420 mg via INTRAVENOUS
  Filled 2013-11-28: qty 14

## 2013-11-28 MED ORDER — DIPHENHYDRAMINE HCL 25 MG PO CAPS
ORAL_CAPSULE | ORAL | Status: AC
  Filled 2013-11-28: qty 1

## 2013-11-28 MED ORDER — LORAZEPAM 2 MG/ML IJ SOLN
INTRAMUSCULAR | Status: AC
  Filled 2013-11-28: qty 1

## 2013-11-28 MED ORDER — TRASTUZUMAB CHEMO INJECTION 440 MG
6.0000 mg/kg | Freq: Once | INTRAVENOUS | Status: AC
  Administered 2013-11-28: 903 mg via INTRAVENOUS
  Filled 2013-11-28: qty 43

## 2013-11-28 MED ORDER — ACETAMINOPHEN 325 MG PO TABS
650.0000 mg | ORAL_TABLET | Freq: Once | ORAL | Status: AC
  Administered 2013-11-28: 650 mg via ORAL

## 2013-11-28 MED ORDER — SODIUM CHLORIDE 0.9 % IV SOLN
Freq: Once | INTRAVENOUS | Status: AC
  Administered 2013-11-28: 13:00:00 via INTRAVENOUS

## 2013-11-28 MED ORDER — HEPARIN SOD (PORK) LOCK FLUSH 100 UNIT/ML IV SOLN
500.0000 [IU] | Freq: Once | INTRAVENOUS | Status: AC | PRN
  Administered 2013-11-28: 500 [IU]
  Filled 2013-11-28: qty 5

## 2013-11-28 MED ORDER — ACETAMINOPHEN 325 MG PO TABS
ORAL_TABLET | ORAL | Status: AC
  Filled 2013-11-28: qty 2

## 2013-11-28 MED ORDER — LORAZEPAM 2 MG/ML IJ SOLN
0.5000 mg | Freq: Once | INTRAMUSCULAR | Status: AC
  Administered 2013-11-28: 0.5 mg via INTRAVENOUS

## 2013-11-28 MED ORDER — SODIUM CHLORIDE 0.9 % IJ SOLN
10.0000 mL | INTRAMUSCULAR | Status: DC | PRN
  Administered 2013-11-28: 10 mL
  Filled 2013-11-28: qty 10

## 2013-11-28 NOTE — Patient Instructions (Signed)
[facility] Discharge Instructions for Patients Receiving Chemotherapy  Today you received the following chemotherapy agents:  Herceptin and Perjeta  To help prevent nausea and vomiting after your treatment, we encourage you to take your nausea medication as ordered per MD.   If you develop nausea and vomiting that is not controlled by your nausea medication, call the clinic.   BELOW ARE SYMPTOMS THAT SHOULD BE REPORTED IMMEDIATELY:  *FEVER GREATER THAN 100.5 F  *CHILLS WITH OR WITHOUT FEVER  NAUSEA AND VOMITING THAT IS NOT CONTROLLED WITH YOUR NAUSEA MEDICATION  *UNUSUAL SHORTNESS OF BREATH  *UNUSUAL BRUISING OR BLEEDING  TENDERNESS IN MOUTH AND THROAT WITH OR WITHOUT PRESENCE OF ULCERS  *URINARY PROBLEMS  *BOWEL PROBLEMS  UNUSUAL RASH Items with * indicate a potential emergency and should be followed up as soon as possible.  Feel free to call the clinic you have any questions or concerns. The clinic phone number is [phone].

## 2013-11-28 NOTE — Telephone Encounter (Signed)
Attempting to schedule follow up with ECHO

## 2013-11-29 ENCOUNTER — Ambulatory Visit: Payer: Medicare Other | Admitting: Oncology

## 2013-11-29 ENCOUNTER — Ambulatory Visit (HOSPITAL_COMMUNITY)
Admission: RE | Admit: 2013-11-29 | Discharge: 2013-11-29 | Disposition: A | Discharge status: Home / Self Care | Payer: Medicare Other | Source: Ambulatory Visit | Attending: Physician Assistant | Admitting: Physician Assistant

## 2013-11-29 ENCOUNTER — Telehealth: Payer: Self-pay | Admitting: *Deleted

## 2013-11-29 ENCOUNTER — Other Ambulatory Visit: Payer: Self-pay | Admitting: Physician Assistant

## 2013-11-29 DIAGNOSIS — M47814 Spondylosis without myelopathy or radiculopathy, thoracic region: Secondary | ICD-10-CM | POA: Insufficient documentation

## 2013-11-29 DIAGNOSIS — R918 Other nonspecific abnormal finding of lung field: Secondary | ICD-10-CM | POA: Insufficient documentation

## 2013-11-29 DIAGNOSIS — C7800 Secondary malignant neoplasm of unspecified lung: Secondary | ICD-10-CM

## 2013-11-29 DIAGNOSIS — C50919 Malignant neoplasm of unspecified site of unspecified female breast: Secondary | ICD-10-CM | POA: Insufficient documentation

## 2013-11-29 DIAGNOSIS — C50212 Malignant neoplasm of upper-inner quadrant of left female breast: Secondary | ICD-10-CM

## 2013-11-29 MED ORDER — IOHEXOL 300 MG/ML  SOLN
80.0000 mL | Freq: Once | INTRAMUSCULAR | Status: AC | PRN
  Administered 2013-11-29: 80 mL via INTRAVENOUS

## 2013-11-29 NOTE — Telephone Encounter (Signed)
Called pt to inform her of results. No answer. I will continue to call pt until I can reach her. Message to be forwarded to [employer], PA-C.

## 2013-11-29 NOTE — Telephone Encounter (Signed)
Called pt to inform her of results of CT. Pt was pleased with the results. I also told her the appt for 4/15 has been cancelled and she will return on 12/19/13 for labs , Dr. Jana Hakim and tx. I told her schedulers will be calling her with exact time. She has her appt for Echo this month on 4/30 @ 11:00  and will followup with Dr. Ninfa Linden in May. Pt verbalized understanding. Message to be forwarded to [employer], PA-C.

## 2013-11-30 ENCOUNTER — Other Ambulatory Visit: Payer: Self-pay | Admitting: Physician Assistant

## 2013-11-30 ENCOUNTER — Ambulatory Visit: Payer: Medicare Other | Admitting: Physician Assistant

## 2013-12-01 ENCOUNTER — Encounter: Payer: Self-pay | Admitting: Oncology

## 2013-12-01 ENCOUNTER — Telehealth: Payer: Self-pay | Admitting: Oncology

## 2013-12-01 ENCOUNTER — Other Ambulatory Visit: Payer: Self-pay | Admitting: Physician Assistant

## 2013-12-01 NOTE — Telephone Encounter (Signed)
LMONVM FOR RE APPTS FOR 5/4, 5/21 AND 5/26. ALSO CONFIRMED APPTS ALREADY ON SCHEDULE FOR ECHO/BENSIMHON 4/30 AND DR Rush Farmer 5/8. SCHEDULE MAILED INCLUDING ALL APPTS. PER COMMIT IN ECHO APPT PT WILL HAVE ECHO AND THEN CLINIC APPT.

## 2013-12-02 ENCOUNTER — Other Ambulatory Visit: Payer: Self-pay | Admitting: *Deleted

## 2013-12-04 ENCOUNTER — Other Ambulatory Visit: Payer: Self-pay | Admitting: Oncology

## 2013-12-15 ENCOUNTER — Ambulatory Visit (HOSPITAL_COMMUNITY)
Admission: RE | Admit: 2013-12-15 | Discharge: 2013-12-15 | Disposition: A | Discharge status: Home / Self Care | Payer: Medicare Other | Source: Ambulatory Visit | Attending: Physician Assistant | Admitting: Physician Assistant

## 2013-12-15 DIAGNOSIS — C50212 Malignant neoplasm of upper-inner quadrant of left female breast: Secondary | ICD-10-CM

## 2013-12-15 DIAGNOSIS — C50919 Malignant neoplasm of unspecified site of unspecified female breast: Secondary | ICD-10-CM | POA: Insufficient documentation

## 2013-12-15 DIAGNOSIS — I519 Heart disease, unspecified: Secondary | ICD-10-CM

## 2013-12-15 DIAGNOSIS — C7800 Secondary malignant neoplasm of unspecified lung: Secondary | ICD-10-CM

## 2013-12-15 NOTE — Progress Notes (Signed)
  Echocardiogram 2D Echocardiogram has been performed.  Carney Corners 12/15/2013, 12:34 PM

## 2013-12-15 NOTE — Progress Notes (Signed)
Pt did have ECHO done today, however did not stay to see the provider as she was c/o N/V Pt requested to call with ECHo results later.

## 2013-12-18 ENCOUNTER — Other Ambulatory Visit (HOSPITAL_COMMUNITY): Payer: Self-pay | Admitting: Family Medicine

## 2013-12-19 ENCOUNTER — Ambulatory Visit (HOSPITAL_BASED_OUTPATIENT_CLINIC_OR_DEPARTMENT_OTHER): Payer: Medicare Other

## 2013-12-19 ENCOUNTER — Other Ambulatory Visit (HOSPITAL_BASED_OUTPATIENT_CLINIC_OR_DEPARTMENT_OTHER): Payer: Medicare Other

## 2013-12-19 VITALS — BP 145/83 | HR 91 | Temp 98.0°F | Resp 20

## 2013-12-19 DIAGNOSIS — C50219 Malignant neoplasm of upper-inner quadrant of unspecified female breast: Secondary | ICD-10-CM

## 2013-12-19 DIAGNOSIS — C7800 Secondary malignant neoplasm of unspecified lung: Principal | ICD-10-CM

## 2013-12-19 DIAGNOSIS — C50919 Malignant neoplasm of unspecified site of unspecified female breast: Secondary | ICD-10-CM

## 2013-12-19 DIAGNOSIS — Z5112 Encounter for antineoplastic immunotherapy: Secondary | ICD-10-CM

## 2013-12-19 DIAGNOSIS — C50212 Malignant neoplasm of upper-inner quadrant of left female breast: Secondary | ICD-10-CM

## 2013-12-19 LAB — COMPREHENSIVE METABOLIC PANEL (CC13)
ALT: 62 U/L — ABNORMAL HIGH (ref 0–55)
AST: 41 U/L — ABNORMAL HIGH (ref 5–34)
Albumin: 3.2 g/dL — ABNORMAL LOW (ref 3.5–5.0)
Alkaline Phosphatase: 122 U/L (ref 40–150)
Anion Gap: 11 mEq/L (ref 3–11)
BUN: 14.8 mg/dL (ref 7.0–26.0)
CO2: 26 mEq/L (ref 22–29)
Calcium: 10.1 mg/dL (ref 8.4–10.4)
Chloride: 104 mEq/L (ref 98–109)
Creatinine: 0.8 mg/dL (ref 0.6–1.1)
Glucose: 156 mg/dl — ABNORMAL HIGH (ref 70–140)
Potassium: 4.1 mEq/L (ref 3.5–5.1)
Sodium: 141 mEq/L (ref 136–145)
Total Bilirubin: 0.23 mg/dL (ref 0.20–1.20)
Total Protein: 7.4 g/dL (ref 6.4–8.3)

## 2013-12-19 LAB — CBC WITH DIFFERENTIAL/PLATELET
BASO%: 0.3 % (ref 0.0–2.0)
Basophils Absolute: 0 10*3/uL (ref 0.0–0.1)
EOS%: 1.3 % (ref 0.0–7.0)
Eosinophils Absolute: 0.1 10*3/uL (ref 0.0–0.5)
HCT: 44 % (ref 34.8–46.6)
HGB: 14.4 g/dL (ref 11.6–15.9)
LYMPH%: 26.6 % (ref 14.0–49.7)
MCH: 27.2 pg (ref 25.1–34.0)
MCHC: 32.8 g/dL (ref 31.5–36.0)
MCV: 83 fL (ref 79.5–101.0)
MONO#: 0.7 10*3/uL (ref 0.1–0.9)
MONO%: 8.1 % (ref 0.0–14.0)
NEUT#: 5.4 10*3/uL (ref 1.5–6.5)
NEUT%: 63.7 % (ref 38.4–76.8)
Platelets: 289 10*3/uL (ref 145–400)
RBC: 5.31 10*6/uL (ref 3.70–5.45)
RDW: 15.5 % — ABNORMAL HIGH (ref 11.2–14.5)
WBC: 8.4 10*3/uL (ref 3.9–10.3)
lymph#: 2.2 10*3/uL (ref 0.9–3.3)

## 2013-12-19 MED ORDER — DIPHENHYDRAMINE HCL 25 MG PO CAPS
25.0000 mg | ORAL_CAPSULE | Freq: Once | ORAL | Status: AC
  Administered 2013-12-19: 25 mg via ORAL

## 2013-12-19 MED ORDER — HEPARIN SOD (PORK) LOCK FLUSH 100 UNIT/ML IV SOLN
500.0000 [IU] | Freq: Once | INTRAVENOUS | Status: AC | PRN
  Administered 2013-12-19: 500 [IU]
  Filled 2013-12-19: qty 5

## 2013-12-19 MED ORDER — SODIUM CHLORIDE 0.9 % IJ SOLN
10.0000 mL | INTRAMUSCULAR | Status: DC | PRN
  Administered 2013-12-19: 10 mL
  Filled 2013-12-19: qty 10

## 2013-12-19 MED ORDER — DIPHENHYDRAMINE HCL 25 MG PO CAPS
ORAL_CAPSULE | ORAL | Status: AC
  Filled 2013-12-19: qty 1

## 2013-12-19 MED ORDER — SODIUM CHLORIDE 0.9 % IV SOLN
Freq: Once | INTRAVENOUS | Status: AC
  Administered 2013-12-19: 15:00:00 via INTRAVENOUS

## 2013-12-19 MED ORDER — TRASTUZUMAB CHEMO INJECTION 440 MG
6.0000 mg/kg | Freq: Once | INTRAVENOUS | Status: AC
  Administered 2013-12-19: 903 mg via INTRAVENOUS
  Filled 2013-12-19: qty 43

## 2013-12-19 MED ORDER — ACETAMINOPHEN 325 MG PO TABS
ORAL_TABLET | ORAL | Status: AC
  Filled 2013-12-19: qty 2

## 2013-12-19 MED ORDER — LORAZEPAM 2 MG/ML IJ SOLN
INTRAMUSCULAR | Status: AC
  Filled 2013-12-19: qty 1

## 2013-12-19 MED ORDER — LORAZEPAM 2 MG/ML IJ SOLN
0.5000 mg | Freq: Once | INTRAMUSCULAR | Status: AC
  Administered 2013-12-19: 0.5 mg via INTRAVENOUS

## 2013-12-19 MED ORDER — ACETAMINOPHEN 325 MG PO TABS
650.0000 mg | ORAL_TABLET | Freq: Once | ORAL | Status: AC
  Administered 2013-12-19: 650 mg via ORAL

## 2013-12-19 MED ORDER — SODIUM CHLORIDE 0.9 % IV SOLN
420.0000 mg | Freq: Once | INTRAVENOUS | Status: AC
  Administered 2013-12-19: 420 mg via INTRAVENOUS
  Filled 2013-12-19: qty 14

## 2013-12-19 NOTE — Patient Instructions (Signed)
[facility] Discharge Instructions for Patients Receiving Chemotherapy  Today you received the following chemotherapy agents: Herceptin, Perjeta  To help prevent nausea and vomiting after your treatment, we encourage you to take your nausea medication: Zofran 8 mg every 12 hrs as needed.    If you develop nausea and vomiting that is not controlled by your nausea medication, call the clinic.   BELOW ARE SYMPTOMS THAT SHOULD BE REPORTED IMMEDIATELY:  *FEVER GREATER THAN 100.5 F  *CHILLS WITH OR WITHOUT FEVER  NAUSEA AND VOMITING THAT IS NOT CONTROLLED WITH YOUR NAUSEA MEDICATION  *UNUSUAL SHORTNESS OF BREATH  *UNUSUAL BRUISING OR BLEEDING  TENDERNESS IN MOUTH AND THROAT WITH OR WITHOUT PRESENCE OF ULCERS  *URINARY PROBLEMS  *BOWEL PROBLEMS  UNUSUAL RASH Items with * indicate a potential emergency and should be followed up as soon as possible.  Feel free to call the clinic you have any questions or concerns. The clinic phone number is [phone].

## 2013-12-20 ENCOUNTER — Other Ambulatory Visit: Payer: Self-pay | Admitting: Oncology

## 2013-12-21 ENCOUNTER — Telehealth: Payer: Self-pay

## 2013-12-21 NOTE — Telephone Encounter (Signed)
Per Dr. Jana Hakim - Let pt know no new or progressive disease identified on CT.  Pt voiced understanding.

## 2013-12-23 ENCOUNTER — Ambulatory Visit (INDEPENDENT_AMBULATORY_CARE_PROVIDER_SITE_OTHER): Payer: Medicare Other | Admitting: Surgery

## 2013-12-23 ENCOUNTER — Encounter: Payer: Self-pay | Admitting: Oncology

## 2013-12-23 NOTE — Progress Notes (Signed)
Called [name] with [employer] to see if asst available for Hercp and Perj. She was approved for 7500.00  09/24/13-12/23/14. Patient VG#[id number]. The patient will be notified and I will get copy of approval note.

## 2013-12-30 ENCOUNTER — Encounter (HOSPITAL_COMMUNITY): Payer: Self-pay | Admitting: Cardiology

## 2013-12-30 ENCOUNTER — Telehealth (HOSPITAL_COMMUNITY): Payer: Self-pay | Admitting: Cardiology

## 2013-12-30 NOTE — Telephone Encounter (Signed)
Attempting to contact pt to schedule 3 month follow up with ECHO I have been unable to reach this patient by phone.  A letter is being sent to the last known home address.

## 2014-01-05 ENCOUNTER — Encounter: Payer: Self-pay | Admitting: Oncology

## 2014-01-05 ENCOUNTER — Other Ambulatory Visit (HOSPITAL_BASED_OUTPATIENT_CLINIC_OR_DEPARTMENT_OTHER): Payer: Medicare Other

## 2014-01-05 ENCOUNTER — Ambulatory Visit (HOSPITAL_BASED_OUTPATIENT_CLINIC_OR_DEPARTMENT_OTHER): Payer: Medicare Other | Admitting: Oncology

## 2014-01-05 ENCOUNTER — Telehealth: Payer: Self-pay | Admitting: Oncology

## 2014-01-05 VITALS — BP 166/87 | HR 68 | Temp 98.1°F | Resp 18 | Ht 63.0 in | Wt 330.3 lb

## 2014-01-05 DIAGNOSIS — C50219 Malignant neoplasm of upper-inner quadrant of unspecified female breast: Secondary | ICD-10-CM

## 2014-01-05 DIAGNOSIS — C7800 Secondary malignant neoplasm of unspecified lung: Secondary | ICD-10-CM

## 2014-01-05 DIAGNOSIS — Z171 Estrogen receptor negative status [ER-]: Secondary | ICD-10-CM

## 2014-01-05 DIAGNOSIS — C50919 Malignant neoplasm of unspecified site of unspecified female breast: Secondary | ICD-10-CM

## 2014-01-05 DIAGNOSIS — C50212 Malignant neoplasm of upper-inner quadrant of left female breast: Secondary | ICD-10-CM

## 2014-01-05 DIAGNOSIS — Z86718 Personal history of other venous thrombosis and embolism: Secondary | ICD-10-CM

## 2014-01-05 LAB — CBC WITH DIFFERENTIAL/PLATELET
BASO%: 0.3 % (ref 0.0–2.0)
Basophils Absolute: 0 10*3/uL (ref 0.0–0.1)
EOS%: 2.2 % (ref 0.0–7.0)
Eosinophils Absolute: 0.2 10*3/uL (ref 0.0–0.5)
HCT: 42 % (ref 34.8–46.6)
HGB: 13.9 g/dL (ref 11.6–15.9)
LYMPH%: 36.5 % (ref 14.0–49.7)
MCH: 27.7 pg (ref 25.1–34.0)
MCHC: 33.1 g/dL (ref 31.5–36.0)
MCV: 83.7 fL (ref 79.5–101.0)
MONO#: 0.4 10*3/uL (ref 0.1–0.9)
MONO%: 6.1 % (ref 0.0–14.0)
NEUT#: 3.8 10*3/uL (ref 1.5–6.5)
NEUT%: 54.9 % (ref 38.4–76.8)
Platelets: 266 10*3/uL (ref 145–400)
RBC: 5.02 10*6/uL (ref 3.70–5.45)
RDW: 14.8 % — ABNORMAL HIGH (ref 11.2–14.5)
WBC: 6.9 10*3/uL (ref 3.9–10.3)
lymph#: 2.5 10*3/uL (ref 0.9–3.3)

## 2014-01-05 MED ORDER — LETROZOLE 2.5 MG PO TABS: 2.5000 mg | ORAL_TABLET | Freq: Every day | ORAL | Status: DC

## 2014-01-05 NOTE — Progress Notes (Signed)
01/05/14 - Met with patient in the lobby this morning and gave her the ePRO's to complete.  Patient completed the ePRO's for the [id number] (SysTHers study). I thanked the patient for her continued participation in this clinical trial.

## 2014-01-05 NOTE — Addendum Note (Signed)
Addended by: Laureen Abrahams on: 01/05/2014 06:37 PM   Modules accepted: Orders
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
## 2014-01-05 NOTE — Telephone Encounter (Signed)
per pof to sch trmt 6/10-gave pt copy of sch

## 2014-01-10 ENCOUNTER — Ambulatory Visit (HOSPITAL_BASED_OUTPATIENT_CLINIC_OR_DEPARTMENT_OTHER): Payer: Medicare Other

## 2014-01-10 ENCOUNTER — Ambulatory Visit (HOSPITAL_BASED_OUTPATIENT_CLINIC_OR_DEPARTMENT_OTHER): Payer: Medicare Other | Admitting: *Deleted

## 2014-01-10 ENCOUNTER — Other Ambulatory Visit: Payer: Self-pay | Admitting: Oncology

## 2014-01-10 ENCOUNTER — Other Ambulatory Visit: Payer: Self-pay

## 2014-01-10 ENCOUNTER — Other Ambulatory Visit: Payer: Self-pay | Admitting: Physician Assistant

## 2014-01-10 DIAGNOSIS — C7800 Secondary malignant neoplasm of unspecified lung: Secondary | ICD-10-CM

## 2014-01-10 DIAGNOSIS — Z95828 Presence of other vascular implants and grafts: Secondary | ICD-10-CM

## 2014-01-10 DIAGNOSIS — C50219 Malignant neoplasm of upper-inner quadrant of unspecified female breast: Secondary | ICD-10-CM

## 2014-01-10 DIAGNOSIS — Z5112 Encounter for antineoplastic immunotherapy: Secondary | ICD-10-CM

## 2014-01-10 DIAGNOSIS — C50919 Malignant neoplasm of unspecified site of unspecified female breast: Secondary | ICD-10-CM

## 2014-01-10 DIAGNOSIS — C50212 Malignant neoplasm of upper-inner quadrant of left female breast: Secondary | ICD-10-CM

## 2014-01-10 LAB — CBC WITH DIFFERENTIAL/PLATELET
BASO%: 0.4 % (ref 0.0–2.0)
Basophils Absolute: 0 10*3/uL (ref 0.0–0.1)
EOS%: 1.4 % (ref 0.0–7.0)
Eosinophils Absolute: 0.1 10*3/uL (ref 0.0–0.5)
HCT: 43.4 % (ref 34.8–46.6)
HGB: 14 g/dL (ref 11.6–15.9)
LYMPH%: 33.8 % (ref 14.0–49.7)
MCH: 27.1 pg (ref 25.1–34.0)
MCHC: 32.1 g/dL (ref 31.5–36.0)
MCV: 84.4 fL (ref 79.5–101.0)
MONO#: 0.7 10*3/uL (ref 0.1–0.9)
MONO%: 7.6 % (ref 0.0–14.0)
NEUT#: 5.1 10*3/uL (ref 1.5–6.5)
NEUT%: 56.8 % (ref 38.4–76.8)
Platelets: 270 10*3/uL (ref 145–400)
RBC: 5.14 10*6/uL (ref 3.70–5.45)
RDW: 15.8 % — ABNORMAL HIGH (ref 11.2–14.5)
WBC: 9 10*3/uL (ref 3.9–10.3)
lymph#: 3 10*3/uL (ref 0.9–3.3)

## 2014-01-10 LAB — COMPREHENSIVE METABOLIC PANEL (CC13)
ALT: 62 U/L — ABNORMAL HIGH (ref 0–55)
AST: 40 U/L — ABNORMAL HIGH (ref 5–34)
Albumin: 3.2 g/dL — ABNORMAL LOW (ref 3.5–5.0)
Alkaline Phosphatase: 111 U/L (ref 40–150)
Anion Gap: 12 mEq/L — ABNORMAL HIGH (ref 3–11)
BUN: 17.7 mg/dL (ref 7.0–26.0)
CO2: 30 mEq/L — ABNORMAL HIGH (ref 22–29)
Calcium: 9.6 mg/dL (ref 8.4–10.4)
Chloride: 103 mEq/L (ref 98–109)
Creatinine: 0.9 mg/dL (ref 0.6–1.1)
Glucose: 167 mg/dl — ABNORMAL HIGH (ref 70–140)
Potassium: 4.3 mEq/L (ref 3.5–5.1)
Sodium: 144 mEq/L (ref 136–145)
Total Bilirubin: 0.27 mg/dL (ref 0.20–1.20)
Total Protein: 7.2 g/dL (ref 6.4–8.3)

## 2014-01-10 MED ORDER — SODIUM CHLORIDE 0.9 % IV SOLN
420.0000 mg | Freq: Once | INTRAVENOUS | Status: AC
  Administered 2014-01-10: 420 mg via INTRAVENOUS
  Filled 2014-01-10: qty 14

## 2014-01-10 MED ORDER — SODIUM CHLORIDE 0.9 % IJ SOLN
10.0000 mL | INTRAMUSCULAR | Status: DC | PRN
  Administered 2014-01-10: 10 mL via INTRAVENOUS
  Filled 2014-01-10: qty 10

## 2014-01-10 MED ORDER — SODIUM CHLORIDE 0.9 % IV SOLN
Freq: Once | INTRAVENOUS | Status: AC
  Administered 2014-01-10: 15:00:00 via INTRAVENOUS

## 2014-01-10 MED ORDER — LORAZEPAM 2 MG/ML IJ SOLN
INTRAMUSCULAR | Status: AC
  Filled 2014-01-10: qty 1

## 2014-01-10 MED ORDER — DIPHENHYDRAMINE HCL 25 MG PO CAPS
ORAL_CAPSULE | ORAL | Status: AC
  Filled 2014-01-10: qty 1

## 2014-01-10 MED ORDER — HEPARIN SOD (PORK) LOCK FLUSH 100 UNIT/ML IV SOLN
500.0000 [IU] | Freq: Once | INTRAVENOUS | Status: AC | PRN
  Administered 2014-01-10: 500 [IU]
  Filled 2014-01-10: qty 5

## 2014-01-10 MED ORDER — TRASTUZUMAB CHEMO INJECTION 440 MG
6.0000 mg/kg | Freq: Once | INTRAVENOUS | Status: AC
  Administered 2014-01-10: 903 mg via INTRAVENOUS
  Filled 2014-01-10: qty 43

## 2014-01-10 MED ORDER — DIPHENHYDRAMINE HCL 25 MG PO CAPS
25.0000 mg | ORAL_CAPSULE | Freq: Once | ORAL | Status: AC
  Administered 2014-01-10: 25 mg via ORAL

## 2014-01-10 MED ORDER — LORAZEPAM 2 MG/ML IJ SOLN
0.5000 mg | Freq: Once | INTRAMUSCULAR | Status: AC
  Administered 2014-01-10: 0.5 mg via INTRAVENOUS

## 2014-01-10 MED ORDER — ACETAMINOPHEN 325 MG PO TABS
ORAL_TABLET | ORAL | Status: AC
  Filled 2014-01-10: qty 2

## 2014-01-10 MED ORDER — ACETAMINOPHEN 325 MG PO TABS
650.0000 mg | ORAL_TABLET | Freq: Once | ORAL | Status: AC
  Administered 2014-01-10: 650 mg via ORAL

## 2014-01-10 MED ORDER — SODIUM CHLORIDE 0.9 % IJ SOLN
10.0000 mL | INTRAMUSCULAR | Status: DC | PRN
  Administered 2014-01-10: 10 mL
  Filled 2014-01-10: qty 10

## 2014-01-10 NOTE — Patient Instructions (Signed)
[name] Discharge Instructions for Patients Receiving Chemotherapy  Today you received Herceptin and Perjeta.   If you develop nausea and vomiting that is not controlled by your nausea medication, call the clinic.   BELOW ARE SYMPTOMS THAT SHOULD BE REPORTED IMMEDIATELY:  *FEVER GREATER THAN 100.5 F  *CHILLS WITH OR WITHOUT FEVER  NAUSEA AND VOMITING THAT IS NOT CONTROLLED WITH YOUR NAUSEA MEDICATION  *UNUSUAL SHORTNESS OF BREATH  *UNUSUAL BRUISING OR BLEEDING  TENDERNESS IN MOUTH AND THROAT WITH OR WITHOUT PRESENCE OF ULCERS  *URINARY PROBLEMS  *BOWEL PROBLEMS  UNUSUAL RASH Items with * indicate a potential emergency and should be followed up as soon as possible.  Feel free to call the clinic you have any questions or concerns. The clinic phone number is [phone].

## 2014-01-10 NOTE — Patient Instructions (Signed)

## 2014-01-12 ENCOUNTER — Telehealth: Payer: Self-pay | Admitting: Oncology

## 2014-01-12 NOTE — Telephone Encounter (Signed)
, °

## 2014-01-13 ENCOUNTER — Telehealth: Payer: Self-pay | Admitting: *Deleted

## 2014-01-13 ENCOUNTER — Ambulatory Visit (INDEPENDENT_AMBULATORY_CARE_PROVIDER_SITE_OTHER): Payer: PRIVATE HEALTH INSURANCE | Admitting: Surgery

## 2014-01-13 NOTE — Telephone Encounter (Signed)
Per staff message and POF I have scheduled appts.  JMW  

## 2014-01-16 ENCOUNTER — Telehealth: Payer: Self-pay | Admitting: *Deleted

## 2014-01-16 NOTE — Telephone Encounter (Signed)
I have adjusted 6/16 appt

## 2014-01-17 ENCOUNTER — Other Ambulatory Visit: Payer: Self-pay | Admitting: *Deleted

## 2014-01-26 ENCOUNTER — Other Ambulatory Visit: Payer: Self-pay | Admitting: *Deleted

## 2014-01-31 ENCOUNTER — Ambulatory Visit (HOSPITAL_BASED_OUTPATIENT_CLINIC_OR_DEPARTMENT_OTHER): Payer: Medicare Other | Admitting: Oncology

## 2014-01-31 ENCOUNTER — Other Ambulatory Visit: Payer: Medicare Other

## 2014-01-31 ENCOUNTER — Telehealth: Payer: Self-pay | Admitting: *Deleted

## 2014-01-31 ENCOUNTER — Ambulatory Visit (HOSPITAL_BASED_OUTPATIENT_CLINIC_OR_DEPARTMENT_OTHER): Payer: Medicare Other

## 2014-01-31 ENCOUNTER — Other Ambulatory Visit (HOSPITAL_BASED_OUTPATIENT_CLINIC_OR_DEPARTMENT_OTHER): Payer: Medicare Other

## 2014-01-31 ENCOUNTER — Telehealth: Payer: Self-pay | Admitting: Oncology

## 2014-01-31 VITALS — BP 135/80 | HR 66 | Temp 98.4°F | Resp 20 | Ht 63.0 in | Wt 329.3 lb

## 2014-01-31 DIAGNOSIS — I82629 Acute embolism and thrombosis of deep veins of unspecified upper extremity: Secondary | ICD-10-CM

## 2014-01-31 DIAGNOSIS — Z5112 Encounter for antineoplastic immunotherapy: Secondary | ICD-10-CM

## 2014-01-31 DIAGNOSIS — C50219 Malignant neoplasm of upper-inner quadrant of unspecified female breast: Secondary | ICD-10-CM

## 2014-01-31 DIAGNOSIS — C7800 Secondary malignant neoplasm of unspecified lung: Secondary | ICD-10-CM

## 2014-01-31 DIAGNOSIS — C50919 Malignant neoplasm of unspecified site of unspecified female breast: Secondary | ICD-10-CM

## 2014-01-31 DIAGNOSIS — I10 Essential (primary) hypertension: Secondary | ICD-10-CM

## 2014-01-31 DIAGNOSIS — C50212 Malignant neoplasm of upper-inner quadrant of left female breast: Secondary | ICD-10-CM

## 2014-01-31 LAB — COMPREHENSIVE METABOLIC PANEL (CC13)
ALT: 53 U/L (ref 0–55)
AST: 29 U/L (ref 5–34)
Albumin: 3 g/dL — ABNORMAL LOW (ref 3.5–5.0)
Alkaline Phosphatase: 107 U/L (ref 40–150)
Anion Gap: 10 mEq/L (ref 3–11)
BUN: 14.6 mg/dL (ref 7.0–26.0)
CO2: 26 mEq/L (ref 22–29)
Calcium: 9.3 mg/dL (ref 8.4–10.4)
Chloride: 103 mEq/L (ref 98–109)
Creatinine: 0.9 mg/dL (ref 0.6–1.1)
Glucose: 280 mg/dl — ABNORMAL HIGH (ref 70–140)
Potassium: 3.9 mEq/L (ref 3.5–5.1)
Sodium: 139 mEq/L (ref 136–145)
Total Bilirubin: 0.29 mg/dL (ref 0.20–1.20)
Total Protein: 7 g/dL (ref 6.4–8.3)

## 2014-01-31 LAB — CBC WITH DIFFERENTIAL/PLATELET
BASO%: 1.1 % (ref 0.0–2.0)
Basophils Absolute: 0.1 10*3/uL (ref 0.0–0.1)
EOS%: 1.9 % (ref 0.0–7.0)
Eosinophils Absolute: 0.1 10*3/uL (ref 0.0–0.5)
HCT: 41.8 % (ref 34.8–46.6)
HGB: 13.5 g/dL (ref 11.6–15.9)
LYMPH%: 35.2 % (ref 14.0–49.7)
MCH: 27.3 pg (ref 25.1–34.0)
MCHC: 32.3 g/dL (ref 31.5–36.0)
MCV: 84.6 fL (ref 79.5–101.0)
MONO#: 0.4 10*3/uL (ref 0.1–0.9)
MONO%: 5.8 % (ref 0.0–14.0)
NEUT#: 3.6 10*3/uL (ref 1.5–6.5)
NEUT%: 56 % (ref 38.4–76.8)
Platelets: 263 10*3/uL (ref 145–400)
RBC: 4.95 10*6/uL (ref 3.70–5.45)
RDW: 15.1 % — ABNORMAL HIGH (ref 11.2–14.5)
WBC: 6.5 10*3/uL (ref 3.9–10.3)
lymph#: 2.3 10*3/uL (ref 0.9–3.3)

## 2014-01-31 MED ORDER — HEPARIN SOD (PORK) LOCK FLUSH 100 UNIT/ML IV SOLN
500.0000 [IU] | Freq: Once | INTRAVENOUS | Status: AC | PRN
  Administered 2014-01-31: 500 [IU]
  Filled 2014-01-31: qty 5

## 2014-01-31 MED ORDER — LORAZEPAM 2 MG/ML IJ SOLN
0.5000 mg | Freq: Once | INTRAMUSCULAR | Status: AC
  Administered 2014-01-31: 0.5 mg via INTRAVENOUS

## 2014-01-31 MED ORDER — LORAZEPAM 2 MG/ML IJ SOLN
INTRAMUSCULAR | Status: AC
  Filled 2014-01-31: qty 1

## 2014-01-31 MED ORDER — ACETAMINOPHEN 325 MG PO TABS
650.0000 mg | ORAL_TABLET | Freq: Once | ORAL | Status: AC
  Administered 2014-01-31: 650 mg via ORAL

## 2014-01-31 MED ORDER — SODIUM CHLORIDE 0.9 % IV SOLN
6.0000 mg/kg | Freq: Once | INTRAVENOUS | Status: AC
  Administered 2014-01-31: 903 mg via INTRAVENOUS
  Filled 2014-01-31: qty 43

## 2014-01-31 MED ORDER — ACETAMINOPHEN 325 MG PO TABS
ORAL_TABLET | ORAL | Status: AC
  Filled 2014-01-31: qty 2

## 2014-01-31 MED ORDER — SODIUM CHLORIDE 0.9 % IJ SOLN
10.0000 mL | INTRAMUSCULAR | Status: DC | PRN
  Administered 2014-01-31: 10 mL
  Filled 2014-01-31: qty 10

## 2014-01-31 MED ORDER — SODIUM CHLORIDE 0.9 % IV SOLN
Freq: Once | INTRAVENOUS | Status: AC
  Administered 2014-01-31: 11:00:00 via INTRAVENOUS

## 2014-01-31 MED ORDER — DIPHENHYDRAMINE HCL 25 MG PO CAPS
ORAL_CAPSULE | ORAL | Status: AC
  Filled 2014-01-31: qty 1

## 2014-01-31 MED ORDER — DIPHENHYDRAMINE HCL 25 MG PO CAPS
25.0000 mg | ORAL_CAPSULE | Freq: Once | ORAL | Status: AC
Start: 2014-01-31 — End: 2014-01-31
  Administered 2014-01-31: 25 mg via ORAL

## 2014-01-31 MED ORDER — METOCLOPRAMIDE HCL 10 MG PO TABS: 10.0000 mg | ORAL_TABLET | Freq: Four times a day (QID) | ORAL | Status: DC | PRN

## 2014-01-31 MED ORDER — SODIUM CHLORIDE 0.9 % IV SOLN
420.0000 mg | Freq: Once | INTRAVENOUS | Status: AC
  Administered 2014-01-31: 420 mg via INTRAVENOUS
  Filled 2014-01-31: qty 14
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
## 2014-01-31 NOTE — Patient Instructions (Signed)
[facility] Discharge Instructions for Patients Receiving Chemotherapy  Today you received the following chemotherapy agents Herceptin/Perjeta.  To help prevent nausea and vomiting after your treatment, we encourage you to take your nausea medication as prescribed.   If you develop nausea and vomiting that is not controlled by your nausea medication, call the clinic.   BELOW ARE SYMPTOMS THAT SHOULD BE REPORTED IMMEDIATELY:  *FEVER GREATER THAN 100.5 F  *CHILLS WITH OR WITHOUT FEVER  NAUSEA AND VOMITING THAT IS NOT CONTROLLED WITH YOUR NAUSEA MEDICATION  *UNUSUAL SHORTNESS OF BREATH  *UNUSUAL BRUISING OR BLEEDING  TENDERNESS IN MOUTH AND THROAT WITH OR WITHOUT PRESENCE OF ULCERS  *URINARY PROBLEMS  *BOWEL PROBLEMS  UNUSUAL RASH Items with * indicate a potential emergency and should be followed up as soon as possible.  Feel free to call the clinic you have any questions or concerns. The clinic phone number is [phone].

## 2014-01-31 NOTE — Telephone Encounter (Signed)
Per staff message and POF I have scheduled appts. Advised scheduler of appts. Advise scheduler to move [email or web address]

## 2014-01-31 NOTE — Telephone Encounter (Signed)
pt wanted to chge trmt times to am hrs. Adv sent email to MW. Adv will wait for reply and call with answer. Pt understood

## 2014-02-01 ENCOUNTER — Telehealth: Payer: Self-pay | Admitting: Oncology

## 2014-02-01 NOTE — Addendum Note (Signed)
Addended by: Prentiss Bells on: 02/01/2014 02:56 PM   Modules accepted: Orders, Medications

## 2014-02-01 NOTE — Telephone Encounter (Signed)
per pof to sch pt appts-sch-order for [facility] send to [facility] for pre cert and sch-will call pt once sch is complete

## 2014-02-07 ENCOUNTER — Telehealth: Payer: Self-pay | Admitting: Oncology

## 2014-02-07 ENCOUNTER — Encounter (HOSPITAL_COMMUNITY): Payer: Self-pay

## 2014-02-07 ENCOUNTER — Ambulatory Visit (HOSPITAL_COMMUNITY): Payer: Medicare Other

## 2014-02-07 ENCOUNTER — Ambulatory Visit (HOSPITAL_COMMUNITY)
Admission: RE | Admit: 2014-02-07 | Discharge: 2014-02-07 | Disposition: A | Discharge status: Home / Self Care | Payer: Medicare Other | Source: Ambulatory Visit | Attending: Oncology | Admitting: Oncology

## 2014-02-07 DIAGNOSIS — R0602 Shortness of breath: Secondary | ICD-10-CM | POA: Insufficient documentation

## 2014-02-07 DIAGNOSIS — R059 Cough, unspecified: Secondary | ICD-10-CM | POA: Insufficient documentation

## 2014-02-07 DIAGNOSIS — C50212 Malignant neoplasm of upper-inner quadrant of left female breast: Secondary | ICD-10-CM

## 2014-02-07 DIAGNOSIS — Z9221 Personal history of antineoplastic chemotherapy: Secondary | ICD-10-CM | POA: Insufficient documentation

## 2014-02-07 DIAGNOSIS — C50919 Malignant neoplasm of unspecified site of unspecified female breast: Secondary | ICD-10-CM | POA: Insufficient documentation

## 2014-02-07 DIAGNOSIS — R05 Cough: Secondary | ICD-10-CM | POA: Insufficient documentation

## 2014-02-07 DIAGNOSIS — C7800 Secondary malignant neoplasm of unspecified lung: Secondary | ICD-10-CM

## 2014-02-07 DIAGNOSIS — R918 Other nonspecific abnormal finding of lung field: Secondary | ICD-10-CM | POA: Insufficient documentation

## 2014-02-07 MED ORDER — IOHEXOL 300 MG/ML  SOLN
80.0000 mL | Freq: Once | INTRAMUSCULAR | Status: AC | PRN
  Administered 2014-02-07: 80 mL via INTRAVENOUS

## 2014-02-07 NOTE — Telephone Encounter (Signed)
per pof to sch ECHO-per LD no pre-cert needed-sch @ [facility] 6/26 @10 -cld & left pt a message for time & date

## 2014-02-08 ENCOUNTER — Telehealth: Payer: Self-pay | Admitting: *Deleted

## 2014-02-08 NOTE — Telephone Encounter (Signed)
Called to inform pt of results concerning CT/Chest. Communicated with pt that everything appears stable, no new metastatic disease or progression. I also reminded pt of upcoming appts and dates. Pt verbalized understanding. No further concerns. Message to be forwarded to [employer], PA-C.

## 2014-02-10 ENCOUNTER — Other Ambulatory Visit: Payer: Self-pay

## 2014-02-10 ENCOUNTER — Ambulatory Visit (HOSPITAL_COMMUNITY)
Admission: RE | Admit: 2014-02-10 | Discharge: 2014-02-10 | Disposition: A | Discharge status: Home / Self Care | Payer: Medicare Other | Source: Ambulatory Visit | Attending: Oncology | Admitting: Oncology

## 2014-02-10 DIAGNOSIS — R112 Nausea with vomiting, unspecified: Secondary | ICD-10-CM | POA: Insufficient documentation

## 2014-02-10 DIAGNOSIS — C50912 Malignant neoplasm of unspecified site of left female breast: Secondary | ICD-10-CM

## 2014-02-10 DIAGNOSIS — C7802 Secondary malignant neoplasm of left lung: Secondary | ICD-10-CM

## 2014-02-10 DIAGNOSIS — E669 Obesity, unspecified: Secondary | ICD-10-CM | POA: Insufficient documentation

## 2014-02-10 DIAGNOSIS — F3289 Other specified depressive episodes: Secondary | ICD-10-CM | POA: Insufficient documentation

## 2014-02-10 DIAGNOSIS — C50919 Malignant neoplasm of unspecified site of unspecified female breast: Secondary | ICD-10-CM | POA: Insufficient documentation

## 2014-02-10 DIAGNOSIS — Z86718 Personal history of other venous thrombosis and embolism: Secondary | ICD-10-CM | POA: Insufficient documentation

## 2014-02-10 DIAGNOSIS — M48061 Spinal stenosis, lumbar region without neurogenic claudication: Secondary | ICD-10-CM | POA: Insufficient documentation

## 2014-02-10 DIAGNOSIS — Z6841 Body Mass Index (BMI) 40.0 and over, adult: Secondary | ICD-10-CM | POA: Insufficient documentation

## 2014-02-10 DIAGNOSIS — R55 Syncope and collapse: Secondary | ICD-10-CM | POA: Insufficient documentation

## 2014-02-10 DIAGNOSIS — Z87891 Personal history of nicotine dependence: Secondary | ICD-10-CM | POA: Insufficient documentation

## 2014-02-10 DIAGNOSIS — G4730 Sleep apnea, unspecified: Secondary | ICD-10-CM | POA: Insufficient documentation

## 2014-02-10 DIAGNOSIS — M1990 Unspecified osteoarthritis, unspecified site: Secondary | ICD-10-CM | POA: Insufficient documentation

## 2014-02-10 DIAGNOSIS — F329 Major depressive disorder, single episode, unspecified: Secondary | ICD-10-CM | POA: Insufficient documentation

## 2014-02-10 DIAGNOSIS — I517 Cardiomegaly: Secondary | ICD-10-CM

## 2014-02-10 DIAGNOSIS — R197 Diarrhea, unspecified: Secondary | ICD-10-CM | POA: Insufficient documentation

## 2014-02-10 DIAGNOSIS — I10 Essential (primary) hypertension: Secondary | ICD-10-CM | POA: Insufficient documentation

## 2014-02-10 DIAGNOSIS — F411 Generalized anxiety disorder: Secondary | ICD-10-CM | POA: Insufficient documentation

## 2014-02-10 MED ORDER — AMLODIPINE BESYLATE 5 MG PO TABS: ORAL_TABLET | ORAL | Status: DC

## 2014-02-10 NOTE — Progress Notes (Signed)
  Echocardiogram 2D Echocardiogram has been performed.  [place], [place] 02/10/2014, 11:22 AM

## 2014-02-13 ENCOUNTER — Telehealth: Payer: Self-pay | Admitting: *Deleted

## 2014-02-13 NOTE — Telephone Encounter (Signed)
Called pt to inform her of latest Echo results. Pt was not home but left message on VM that her echo was good. Told pt if she has any questions she can call this nurse back [phone]. Message to be forwarded to [place].

## 2014-02-20 ENCOUNTER — Telehealth: Payer: Self-pay

## 2014-02-20 ENCOUNTER — Telehealth: Payer: Self-pay | Admitting: *Deleted

## 2014-02-20 MED ORDER — BENZONATATE 200 MG PO CAPS: 200.0000 mg | ORAL_CAPSULE | Freq: Three times a day (TID) | ORAL | Status: DC | PRN

## 2014-02-20 NOTE — Telephone Encounter (Signed)
Returned pt call re: cough and treatment tomorrow.  Pt reports cough for last week.  Has been through one bottle of Robitussin, can barely talk, and coughs so hard it makes her throw up.  Pt states she has asthma and allergies and has done this before.  She could only get it cleared up with prescription cough med.  She does not believe she has an infectious illness.  Per pt, she does not remember what the medicine was.  [facility] - pt took in April of 2014 Tessalon pearls.  [facility] to [facility] [place] - receipt confirmed.  Let pt know prescription sent and to be at clinic at 1 pm to register.  Pt voiced understanding.     Pt to be seen prior to chemo appt tomorrow.  Per Val - place on Norco Curcio's schedule at 145.  Lab needs to be moved up prior to APP appt.  pof sent and note given to scheduling.

## 2014-02-21 ENCOUNTER — Encounter: Payer: Self-pay | Admitting: Oncology

## 2014-02-21 ENCOUNTER — Other Ambulatory Visit (HOSPITAL_BASED_OUTPATIENT_CLINIC_OR_DEPARTMENT_OTHER): Payer: Medicare Other

## 2014-02-21 ENCOUNTER — Other Ambulatory Visit: Payer: Medicare Other

## 2014-02-21 ENCOUNTER — Ambulatory Visit (HOSPITAL_BASED_OUTPATIENT_CLINIC_OR_DEPARTMENT_OTHER): Payer: Medicare Other

## 2014-02-21 ENCOUNTER — Ambulatory Visit (HOSPITAL_BASED_OUTPATIENT_CLINIC_OR_DEPARTMENT_OTHER): Payer: Medicare Other | Admitting: Oncology

## 2014-02-21 VITALS — BP 142/74 | HR 73 | Temp 96.8°F | Resp 20 | Ht 63.0 in | Wt 325.9 lb

## 2014-02-21 DIAGNOSIS — C7800 Secondary malignant neoplasm of unspecified lung: Principal | ICD-10-CM

## 2014-02-21 DIAGNOSIS — C50212 Malignant neoplasm of upper-inner quadrant of left female breast: Secondary | ICD-10-CM

## 2014-02-21 DIAGNOSIS — C50219 Malignant neoplasm of upper-inner quadrant of unspecified female breast: Secondary | ICD-10-CM

## 2014-02-21 DIAGNOSIS — J329 Chronic sinusitis, unspecified: Secondary | ICD-10-CM

## 2014-02-21 DIAGNOSIS — Z5112 Encounter for antineoplastic immunotherapy: Secondary | ICD-10-CM

## 2014-02-21 DIAGNOSIS — E860 Dehydration: Secondary | ICD-10-CM

## 2014-02-21 DIAGNOSIS — I82629 Acute embolism and thrombosis of deep veins of unspecified upper extremity: Secondary | ICD-10-CM

## 2014-02-21 DIAGNOSIS — R059 Cough, unspecified: Secondary | ICD-10-CM

## 2014-02-21 DIAGNOSIS — R05 Cough: Secondary | ICD-10-CM

## 2014-02-21 DIAGNOSIS — C50919 Malignant neoplasm of unspecified site of unspecified female breast: Secondary | ICD-10-CM

## 2014-02-21 DIAGNOSIS — Z171 Estrogen receptor negative status [ER-]: Secondary | ICD-10-CM

## 2014-02-21 LAB — COMPREHENSIVE METABOLIC PANEL (CC13)
ALT: 40 U/L (ref 0–55)
AST: 29 U/L (ref 5–34)
Albumin: 2.7 g/dL — ABNORMAL LOW (ref 3.5–5.0)
Alkaline Phosphatase: 117 U/L (ref 40–150)
Anion Gap: 10 mEq/L (ref 3–11)
BUN: 11.8 mg/dL (ref 7.0–26.0)
CO2: 30 mEq/L — ABNORMAL HIGH (ref 22–29)
Calcium: 9.6 mg/dL (ref 8.4–10.4)
Chloride: 98 mEq/L (ref 98–109)
Creatinine: 0.9 mg/dL (ref 0.6–1.1)
Glucose: 201 mg/dl — ABNORMAL HIGH (ref 70–140)
Potassium: 3.9 mEq/L (ref 3.5–5.1)
Sodium: 137 mEq/L (ref 136–145)
Total Bilirubin: 0.35 mg/dL (ref 0.20–1.20)
Total Protein: 7.2 g/dL (ref 6.4–8.3)

## 2014-02-21 LAB — CBC WITH DIFFERENTIAL/PLATELET
BASO%: 0.6 % (ref 0.0–2.0)
Basophils Absolute: 0.1 10*3/uL (ref 0.0–0.1)
EOS%: 2.6 % (ref 0.0–7.0)
Eosinophils Absolute: 0.2 10*3/uL (ref 0.0–0.5)
HCT: 40.4 % (ref 34.8–46.6)
HGB: 13.2 g/dL (ref 11.6–15.9)
LYMPH%: 26.2 % (ref 14.0–49.7)
MCH: 27.5 pg (ref 25.1–34.0)
MCHC: 32.7 g/dL (ref 31.5–36.0)
MCV: 84.2 fL (ref 79.5–101.0)
MONO#: 0.7 10*3/uL (ref 0.1–0.9)
MONO%: 8.4 % (ref 0.0–14.0)
NEUT#: 5.3 10*3/uL (ref 1.5–6.5)
NEUT%: 62.2 % (ref 38.4–76.8)
Platelets: 305 10*3/uL (ref 145–400)
RBC: 4.8 10*6/uL (ref 3.70–5.45)
RDW: 14.4 % (ref 11.2–14.5)
WBC: 8.5 10*3/uL (ref 3.9–10.3)
lymph#: 2.2 10*3/uL (ref 0.9–3.3)

## 2014-02-21 MED ORDER — SODIUM CHLORIDE 0.9 % IV SOLN
Freq: Once | INTRAVENOUS | Status: AC
  Administered 2014-02-21: 15:00:00 via INTRAVENOUS

## 2014-02-21 MED ORDER — DIPHENHYDRAMINE HCL 25 MG PO CAPS
ORAL_CAPSULE | ORAL | Status: AC
  Filled 2014-02-21: qty 1

## 2014-02-21 MED ORDER — TRASTUZUMAB CHEMO INJECTION 440 MG
6.0000 mg/kg | Freq: Once | INTRAVENOUS | Status: AC
  Administered 2014-02-21: 903 mg via INTRAVENOUS
  Filled 2014-02-21: qty 43

## 2014-02-21 MED ORDER — LORAZEPAM 2 MG/ML IJ SOLN
0.5000 mg | Freq: Once | INTRAMUSCULAR | Status: AC
  Administered 2014-02-21: 0.5 mg via INTRAVENOUS

## 2014-02-21 MED ORDER — SODIUM CHLORIDE 0.9 % IJ SOLN
10.0000 mL | INTRAMUSCULAR | Status: DC | PRN
  Administered 2014-02-21: 10 mL
  Filled 2014-02-21: qty 10

## 2014-02-21 MED ORDER — ACETAMINOPHEN 325 MG PO TABS
650.0000 mg | ORAL_TABLET | Freq: Once | ORAL | Status: AC
  Administered 2014-02-21: 650 mg via ORAL

## 2014-02-21 MED ORDER — ACETAMINOPHEN 325 MG PO TABS
ORAL_TABLET | ORAL | Status: AC
  Filled 2014-02-21: qty 2

## 2014-02-21 MED ORDER — HEPARIN SOD (PORK) LOCK FLUSH 100 UNIT/ML IV SOLN
500.0000 [IU] | Freq: Once | INTRAVENOUS | Status: AC | PRN
  Administered 2014-02-21: 500 [IU]
  Filled 2014-02-21: qty 5

## 2014-02-21 MED ORDER — SODIUM CHLORIDE 0.9 % IV SOLN
420.0000 mg | Freq: Once | INTRAVENOUS | Status: AC
  Administered 2014-02-21: 420 mg via INTRAVENOUS
  Filled 2014-02-21: qty 14

## 2014-02-21 MED ORDER — LORAZEPAM 2 MG/ML IJ SOLN
INTRAMUSCULAR | Status: AC
  Filled 2014-02-21: qty 1

## 2014-02-21 MED ORDER — HYDROCODONE-HOMATROPINE 5-1.5 MG/5ML PO SYRP: 5.0000 mL | ORAL_SOLUTION | Freq: Four times a day (QID) | ORAL | Status: DC | PRN

## 2014-02-21 MED ORDER — LEVOFLOXACIN 500 MG PO TABS: 500.0000 mg | ORAL_TABLET | Freq: Every day | ORAL | Status: DC

## 2014-02-21 MED ORDER — DIPHENHYDRAMINE HCL 25 MG PO CAPS
25.0000 mg | ORAL_CAPSULE | Freq: Once | ORAL | Status: AC
  Administered 2014-02-21: 25 mg via ORAL
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
## 2014-02-21 NOTE — Patient Instructions (Addendum)
[facility] Discharge Instructions for Patients Receiving Chemotherapy  Today you received the following chemotherapy agents: Herceptin and Perjeta  To help prevent nausea and vomiting after your treatment, we encourage you to take your nausea medication as prescribed by your physician.    If you develop nausea and vomiting that is not controlled by your nausea medication, call the clinic.   BELOW ARE SYMPTOMS THAT SHOULD BE REPORTED IMMEDIATELY:  *FEVER GREATER THAN 100.5 F  *CHILLS WITH OR WITHOUT FEVER  NAUSEA AND VOMITING THAT IS NOT CONTROLLED WITH YOUR NAUSEA MEDICATION  *UNUSUAL SHORTNESS OF BREATH  *UNUSUAL BRUISING OR BLEEDING  TENDERNESS IN MOUTH AND THROAT WITH OR WITHOUT PRESENCE OF ULCERS  *URINARY PROBLEMS  *BOWEL PROBLEMS  UNUSUAL RASH Items with * indicate a potential emergency and should be followed up as soon as possible.  Feel free to call the clinic you have any questions or concerns. The clinic phone number is [phone].

## 2014-02-22 ENCOUNTER — Telehealth: Payer: Self-pay | Admitting: *Deleted

## 2014-02-22 ENCOUNTER — Telehealth: Payer: Self-pay | Admitting: Oncology

## 2014-02-22 NOTE — Telephone Encounter (Signed)
Msg Sharyn Lull to add apt for Herceptin and Perjeta to labs/ov 03/14/14 per 02/21/14 POF, no other orders .Marland Kitchen..KJ

## 2014-02-22 NOTE — Telephone Encounter (Signed)
Per staff message and POF I have scheduled appts. Advised scheduler of appts. JMW  

## 2014-03-01 ENCOUNTER — Telehealth: Payer: Self-pay | Admitting: Oncology

## 2014-03-01 NOTE — Telephone Encounter (Signed)
r/s appt-cld pt to adv of update sch time & date-adv that ECHO had been sch-adv pt I would mil copy of sch

## 2014-03-13 ENCOUNTER — Other Ambulatory Visit: Payer: Self-pay | Admitting: *Deleted

## 2014-03-13 DIAGNOSIS — C7800 Secondary malignant neoplasm of unspecified lung: Principal | ICD-10-CM

## 2014-03-13 DIAGNOSIS — C50919 Malignant neoplasm of unspecified site of unspecified female breast: Secondary | ICD-10-CM

## 2014-03-14 ENCOUNTER — Ambulatory Visit (HOSPITAL_BASED_OUTPATIENT_CLINIC_OR_DEPARTMENT_OTHER): Payer: Medicare Other | Admitting: Oncology

## 2014-03-14 ENCOUNTER — Ambulatory Visit: Payer: Medicare Other

## 2014-03-14 ENCOUNTER — Telehealth: Payer: Self-pay | Admitting: Oncology

## 2014-03-14 ENCOUNTER — Other Ambulatory Visit (HOSPITAL_BASED_OUTPATIENT_CLINIC_OR_DEPARTMENT_OTHER): Payer: Medicare Other

## 2014-03-14 ENCOUNTER — Ambulatory Visit (HOSPITAL_BASED_OUTPATIENT_CLINIC_OR_DEPARTMENT_OTHER): Payer: Medicare Other

## 2014-03-14 ENCOUNTER — Other Ambulatory Visit: Payer: Medicare Other

## 2014-03-14 ENCOUNTER — Telehealth: Payer: Self-pay | Admitting: *Deleted

## 2014-03-14 VITALS — BP 174/81 | HR 74 | Temp 97.7°F | Resp 18 | Ht 63.0 in | Wt 324.9 lb

## 2014-03-14 DIAGNOSIS — C7802 Secondary malignant neoplasm of left lung: Principal | ICD-10-CM

## 2014-03-14 DIAGNOSIS — C7800 Secondary malignant neoplasm of unspecified lung: Secondary | ICD-10-CM

## 2014-03-14 DIAGNOSIS — C50919 Malignant neoplasm of unspecified site of unspecified female breast: Secondary | ICD-10-CM

## 2014-03-14 DIAGNOSIS — G8929 Other chronic pain: Secondary | ICD-10-CM

## 2014-03-14 DIAGNOSIS — Z86718 Personal history of other venous thrombosis and embolism: Secondary | ICD-10-CM

## 2014-03-14 DIAGNOSIS — C50219 Malignant neoplasm of upper-inner quadrant of unspecified female breast: Secondary | ICD-10-CM

## 2014-03-14 DIAGNOSIS — C50212 Malignant neoplasm of upper-inner quadrant of left female breast: Secondary | ICD-10-CM

## 2014-03-14 DIAGNOSIS — Z5112 Encounter for antineoplastic immunotherapy: Secondary | ICD-10-CM

## 2014-03-14 DIAGNOSIS — C50912 Malignant neoplasm of unspecified site of left female breast: Secondary | ICD-10-CM

## 2014-03-14 DIAGNOSIS — Z171 Estrogen receptor negative status [ER-]: Secondary | ICD-10-CM

## 2014-03-14 LAB — COMPREHENSIVE METABOLIC PANEL (CC13)
ALT: 56 U/L — ABNORMAL HIGH (ref 0–55)
AST: 33 U/L (ref 5–34)
Albumin: 3 g/dL — ABNORMAL LOW (ref 3.5–5.0)
Alkaline Phosphatase: 100 U/L (ref 40–150)
Anion Gap: 8 mEq/L (ref 3–11)
BUN: 18.4 mg/dL (ref 7.0–26.0)
CO2: 33 mEq/L — ABNORMAL HIGH (ref 22–29)
Calcium: 9.4 mg/dL (ref 8.4–10.4)
Chloride: 100 mEq/L (ref 98–109)
Creatinine: 0.9 mg/dL (ref 0.6–1.1)
Glucose: 281 mg/dl — ABNORMAL HIGH (ref 70–140)
Potassium: 3.9 mEq/L (ref 3.5–5.1)
Sodium: 141 mEq/L (ref 136–145)
Total Bilirubin: 0.29 mg/dL (ref 0.20–1.20)
Total Protein: 7.1 g/dL (ref 6.4–8.3)

## 2014-03-14 LAB — CBC WITH DIFFERENTIAL/PLATELET
BASO%: 0.8 % (ref 0.0–2.0)
Basophils Absolute: 0.1 10*3/uL (ref 0.0–0.1)
EOS%: 2.3 % (ref 0.0–7.0)
Eosinophils Absolute: 0.2 10*3/uL (ref 0.0–0.5)
HCT: 42.8 % (ref 34.8–46.6)
HGB: 13.7 g/dL (ref 11.6–15.9)
LYMPH%: 30.9 % (ref 14.0–49.7)
MCH: 27.5 pg (ref 25.1–34.0)
MCHC: 32.1 g/dL (ref 31.5–36.0)
MCV: 85.5 fL (ref 79.5–101.0)
MONO#: 0.4 10*3/uL (ref 0.1–0.9)
MONO%: 6.6 % (ref 0.0–14.0)
NEUT#: 4 10*3/uL (ref 1.5–6.5)
NEUT%: 59.4 % (ref 38.4–76.8)
Platelets: 265 10*3/uL (ref 145–400)
RBC: 5.01 10*6/uL (ref 3.70–5.45)
RDW: 15.3 % — ABNORMAL HIGH (ref 11.2–14.5)
WBC: 6.7 10*3/uL (ref 3.9–10.3)
lymph#: 2.1 10*3/uL (ref 0.9–3.3)

## 2014-03-14 MED ORDER — ACETAMINOPHEN 325 MG PO TABS
ORAL_TABLET | ORAL | Status: AC
  Filled 2014-03-14: qty 2

## 2014-03-14 MED ORDER — LORAZEPAM 2 MG/ML IJ SOLN
INTRAMUSCULAR | Status: AC
  Filled 2014-03-14: qty 1

## 2014-03-14 MED ORDER — ACETAMINOPHEN 325 MG PO TABS
650.0000 mg | ORAL_TABLET | Freq: Once | ORAL | Status: AC
  Administered 2014-03-14: 650 mg via ORAL

## 2014-03-14 MED ORDER — SODIUM CHLORIDE 0.9 % IV SOLN
Freq: Once | INTRAVENOUS | Status: AC
  Administered 2014-03-14: 12:00:00 via INTRAVENOUS

## 2014-03-14 MED ORDER — LORAZEPAM 2 MG/ML IJ SOLN
0.5000 mg | Freq: Once | INTRAMUSCULAR | Status: AC
  Administered 2014-03-14: 0.5 mg via INTRAVENOUS

## 2014-03-14 MED ORDER — SODIUM CHLORIDE 0.9 % IJ SOLN
10.0000 mL | INTRAMUSCULAR | Status: DC | PRN
  Administered 2014-03-14: 10 mL via INTRAVENOUS
  Filled 2014-03-14: qty 10

## 2014-03-14 MED ORDER — HEPARIN SOD (PORK) LOCK FLUSH 100 UNIT/ML IV SOLN
500.0000 [IU] | Freq: Once | INTRAVENOUS | Status: AC | PRN
  Administered 2014-03-14: 500 [IU]
  Filled 2014-03-14: qty 5

## 2014-03-14 MED ORDER — TRASTUZUMAB CHEMO INJECTION 440 MG
6.0000 mg/kg | Freq: Once | INTRAVENOUS | Status: AC
  Administered 2014-03-14: 903 mg via INTRAVENOUS
  Filled 2014-03-14: qty 43

## 2014-03-14 MED ORDER — SODIUM CHLORIDE 0.9 % IJ SOLN
10.0000 mL | INTRAMUSCULAR | Status: DC | PRN
  Administered 2014-03-14: 10 mL
  Filled 2014-03-14: qty 10

## 2014-03-14 MED ORDER — DIPHENHYDRAMINE HCL 25 MG PO CAPS
25.0000 mg | ORAL_CAPSULE | Freq: Once | ORAL | Status: AC
  Administered 2014-03-14: 25 mg via ORAL

## 2014-03-14 MED ORDER — DIPHENHYDRAMINE HCL 25 MG PO CAPS
ORAL_CAPSULE | ORAL | Status: AC
  Filled 2014-03-14: qty 1

## 2014-03-14 MED ORDER — SODIUM CHLORIDE 0.9 % IV SOLN
420.0000 mg | Freq: Once | INTRAVENOUS | Status: AC
  Administered 2014-03-14: 420 mg via INTRAVENOUS
  Filled 2014-03-14: qty 14

## 2014-03-14 NOTE — Telephone Encounter (Signed)
Per POF staff phone call scheduled appts. Advised schedulers
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
## 2014-03-14 NOTE — Telephone Encounter (Signed)
per pof to sch pt appts-[facility] sch trmts-pt to get updated copy of sch in trmt room

## 2014-03-14 NOTE — Patient Instructions (Signed)
[facility] Discharge Instructions for Patients Receiving Chemotherapy  Today you received the following chemotherapy agents Herceptin/Perjeta.  To help prevent nausea and vomiting after your treatment, we encourage you to take your nausea medication as prescribed.   If you develop nausea and vomiting that is not controlled by your nausea medication, call the clinic.   BELOW ARE SYMPTOMS THAT SHOULD BE REPORTED IMMEDIATELY:  *FEVER GREATER THAN 100.5 F  *CHILLS WITH OR WITHOUT FEVER  NAUSEA AND VOMITING THAT IS NOT CONTROLLED WITH YOUR NAUSEA MEDICATION  *UNUSUAL SHORTNESS OF BREATH  *UNUSUAL BRUISING OR BLEEDING  TENDERNESS IN MOUTH AND THROAT WITH OR WITHOUT PRESENCE OF ULCERS  *URINARY PROBLEMS  *BOWEL PROBLEMS  UNUSUAL RASH Items with * indicate a potential emergency and should be followed up as soon as possible.  Feel free to call the clinic you have any questions or concerns. The clinic phone number is [phone].

## 2014-03-20 ENCOUNTER — Other Ambulatory Visit: Payer: Medicare Other

## 2014-03-22 ENCOUNTER — Encounter: Payer: Self-pay | Admitting: Oncology

## 2014-03-22 NOTE — Progress Notes (Signed)
PAN asst 09/24/13-12/23/14  7500.00.= [id number]  Received denial for date of service 09/05/13. Patient will be responsible.

## 2014-03-24 ENCOUNTER — Other Ambulatory Visit: Payer: Medicare Other

## 2014-03-24 ENCOUNTER — Ambulatory Visit: Payer: Medicare Other

## 2014-03-29 ENCOUNTER — Other Ambulatory Visit: Payer: Medicare Other

## 2014-03-29 ENCOUNTER — Encounter: Payer: Medicare Other | Admitting: Oncology

## 2014-03-29 ENCOUNTER — Ambulatory Visit: Payer: Medicare Other

## 2014-03-30 ENCOUNTER — Other Ambulatory Visit: Payer: Self-pay | Admitting: *Deleted

## 2014-03-30 ENCOUNTER — Encounter: Payer: Self-pay | Admitting: Oncology

## 2014-03-30 ENCOUNTER — Encounter: Payer: Self-pay | Admitting: *Deleted

## 2014-03-30 DIAGNOSIS — C50212 Malignant neoplasm of upper-inner quadrant of left female breast: Secondary | ICD-10-CM

## 2014-03-30 DIAGNOSIS — C50919 Malignant neoplasm of unspecified site of unspecified female breast: Secondary | ICD-10-CM

## 2014-03-30 MED ORDER — ONDANSETRON HCL 8 MG PO TABS: ORAL_TABLET | ORAL | Status: DC

## 2014-03-30 NOTE — Progress Notes (Signed)
RECEIVED A FAX FROM [facility] CONCERNING A PRIOR AUTHORIZATION FOR ONDANSETRON. THIS REQUEST WAS PLACED IN THE MANAGED CARE BIN.

## 2014-03-30 NOTE — Progress Notes (Signed)
[name], [id number], approved ondansetron 8mg  from 03/30/14-08/17/14

## 2014-04-02 ENCOUNTER — Other Ambulatory Visit: Payer: Self-pay | Admitting: Oncology

## 2014-04-03 ENCOUNTER — Other Ambulatory Visit: Payer: Self-pay | Admitting: *Deleted

## 2014-04-03 DIAGNOSIS — C50212 Malignant neoplasm of upper-inner quadrant of left female breast: Secondary | ICD-10-CM

## 2014-04-04 ENCOUNTER — Ambulatory Visit: Payer: Medicare Other

## 2014-04-04 ENCOUNTER — Encounter: Payer: Self-pay | Admitting: Oncology

## 2014-04-04 ENCOUNTER — Ambulatory Visit (HOSPITAL_BASED_OUTPATIENT_CLINIC_OR_DEPARTMENT_OTHER): Payer: Medicare Other | Admitting: Nurse Practitioner

## 2014-04-04 ENCOUNTER — Other Ambulatory Visit (HOSPITAL_BASED_OUTPATIENT_CLINIC_OR_DEPARTMENT_OTHER): Payer: Medicare Other

## 2014-04-04 ENCOUNTER — Other Ambulatory Visit: Payer: Self-pay | Admitting: Oncology

## 2014-04-04 ENCOUNTER — Encounter: Payer: Self-pay | Admitting: Nurse Practitioner

## 2014-04-04 ENCOUNTER — Ambulatory Visit (HOSPITAL_BASED_OUTPATIENT_CLINIC_OR_DEPARTMENT_OTHER): Payer: Medicare Other

## 2014-04-04 ENCOUNTER — Other Ambulatory Visit: Payer: Self-pay | Admitting: Medical Oncology

## 2014-04-04 VITALS — BP 142/74 | HR 75 | Temp 98.4°F | Resp 21 | Ht 63.0 in | Wt 324.1 lb

## 2014-04-04 DIAGNOSIS — R5383 Other fatigue: Secondary | ICD-10-CM | POA: Insufficient documentation

## 2014-04-04 DIAGNOSIS — C7800 Secondary malignant neoplasm of unspecified lung: Secondary | ICD-10-CM

## 2014-04-04 DIAGNOSIS — R5382 Chronic fatigue, unspecified: Secondary | ICD-10-CM

## 2014-04-04 DIAGNOSIS — R110 Nausea: Secondary | ICD-10-CM

## 2014-04-04 DIAGNOSIS — C50212 Malignant neoplasm of upper-inner quadrant of left female breast: Secondary | ICD-10-CM

## 2014-04-04 DIAGNOSIS — C50219 Malignant neoplasm of upper-inner quadrant of unspecified female breast: Secondary | ICD-10-CM

## 2014-04-04 DIAGNOSIS — R5381 Other malaise: Secondary | ICD-10-CM

## 2014-04-04 DIAGNOSIS — C50912 Malignant neoplasm of unspecified site of left female breast: Secondary | ICD-10-CM

## 2014-04-04 DIAGNOSIS — C7802 Secondary malignant neoplasm of left lung: Secondary | ICD-10-CM

## 2014-04-04 DIAGNOSIS — Z5112 Encounter for antineoplastic immunotherapy: Secondary | ICD-10-CM

## 2014-04-04 DIAGNOSIS — Z95828 Presence of other vascular implants and grafts: Secondary | ICD-10-CM

## 2014-04-04 DIAGNOSIS — Z171 Estrogen receptor negative status [ER-]: Secondary | ICD-10-CM

## 2014-04-04 LAB — CBC WITH DIFFERENTIAL/PLATELET
BASO%: 0.4 % (ref 0.0–2.0)
Basophils Absolute: 0 10*3/uL (ref 0.0–0.1)
EOS%: 1.7 % (ref 0.0–7.0)
Eosinophils Absolute: 0.1 10*3/uL (ref 0.0–0.5)
HCT: 43.7 % (ref 34.8–46.6)
HGB: 14.1 g/dL (ref 11.6–15.9)
LYMPH%: 29.6 % (ref 14.0–49.7)
MCH: 27.6 pg (ref 25.1–34.0)
MCHC: 32.3 g/dL (ref 31.5–36.0)
MCV: 85.5 fL (ref 79.5–101.0)
MONO#: 0.5 10*3/uL (ref 0.1–0.9)
MONO%: 6.5 % (ref 0.0–14.0)
NEUT#: 4.5 10*3/uL (ref 1.5–6.5)
NEUT%: 61.8 % (ref 38.4–76.8)
Platelets: 282 10*3/uL (ref 145–400)
RBC: 5.11 10*6/uL (ref 3.70–5.45)
RDW: 15.1 % — ABNORMAL HIGH (ref 11.2–14.5)
WBC: 7.3 10*3/uL (ref 3.9–10.3)
lymph#: 2.2 10*3/uL (ref 0.9–3.3)

## 2014-04-04 LAB — COMPREHENSIVE METABOLIC PANEL (CC13)
ALT: 55 U/L (ref 0–55)
AST: 31 U/L (ref 5–34)
Albumin: 3.1 g/dL — ABNORMAL LOW (ref 3.5–5.0)
Alkaline Phosphatase: 116 U/L (ref 40–150)
Anion Gap: 9 mEq/L (ref 3–11)
BUN: 14.4 mg/dL (ref 7.0–26.0)
CO2: 29 mEq/L (ref 22–29)
Calcium: 9.7 mg/dL (ref 8.4–10.4)
Chloride: 103 mEq/L (ref 98–109)
Creatinine: 0.9 mg/dL (ref 0.6–1.1)
Glucose: 248 mg/dl — ABNORMAL HIGH (ref 70–140)
Potassium: 3.8 mEq/L (ref 3.5–5.1)
Sodium: 141 mEq/L (ref 136–145)
Total Bilirubin: 0.27 mg/dL (ref 0.20–1.20)
Total Protein: 7.4 g/dL (ref 6.4–8.3)

## 2014-04-04 MED ORDER — ACETAMINOPHEN 325 MG PO TABS
650.0000 mg | ORAL_TABLET | Freq: Once | ORAL | Status: AC
  Administered 2014-04-04: 650 mg via ORAL

## 2014-04-04 MED ORDER — SODIUM CHLORIDE 0.9 % IV SOLN
420.0000 mg | Freq: Once | INTRAVENOUS | Status: AC
  Administered 2014-04-04: 420 mg via INTRAVENOUS
  Filled 2014-04-04: qty 14

## 2014-04-04 MED ORDER — SODIUM CHLORIDE 0.9 % IJ SOLN
10.0000 mL | INTRAMUSCULAR | Status: DC | PRN
  Administered 2014-04-04: 10 mL via INTRAVENOUS
  Filled 2014-04-04: qty 10

## 2014-04-04 MED ORDER — ALTEPLASE 2 MG IJ SOLR
2.0000 mg | Freq: Once | INTRAMUSCULAR | Status: AC
  Administered 2014-04-04: 2 mg
  Filled 2014-04-04: qty 2

## 2014-04-04 MED ORDER — HEPARIN SOD (PORK) LOCK FLUSH 100 UNIT/ML IV SOLN
500.0000 [IU] | Freq: Once | INTRAVENOUS | Status: AC | PRN
  Administered 2014-04-04: 500 [IU]
  Filled 2014-04-04: qty 5

## 2014-04-04 MED ORDER — ACETAMINOPHEN 325 MG PO TABS
ORAL_TABLET | ORAL | Status: AC
  Filled 2014-04-04: qty 2

## 2014-04-04 MED ORDER — LORAZEPAM 2 MG/ML IJ SOLN
INTRAMUSCULAR | Status: AC
  Filled 2014-04-04: qty 1

## 2014-04-04 MED ORDER — SODIUM CHLORIDE 0.9 % IJ SOLN
10.0000 mL | INTRAMUSCULAR | Status: DC | PRN
  Administered 2014-04-04: 10 mL
  Filled 2014-04-04: qty 10

## 2014-04-04 MED ORDER — SODIUM CHLORIDE 0.9 % IV SOLN
6.0000 mg/kg | Freq: Once | INTRAVENOUS | Status: AC
  Administered 2014-04-04: 903 mg via INTRAVENOUS
  Filled 2014-04-04: qty 43

## 2014-04-04 MED ORDER — DIPHENHYDRAMINE HCL 25 MG PO CAPS
ORAL_CAPSULE | ORAL | Status: AC
  Filled 2014-04-04: qty 1

## 2014-04-04 MED ORDER — LORAZEPAM 2 MG/ML IJ SOLN
0.5000 mg | Freq: Once | INTRAMUSCULAR | Status: AC
  Administered 2014-04-04: 0.5 mg via INTRAVENOUS

## 2014-04-04 MED ORDER — SODIUM CHLORIDE 0.9 % IV SOLN
Freq: Once | INTRAVENOUS | Status: AC
  Administered 2014-04-04: 12:00:00 via INTRAVENOUS

## 2014-04-04 MED ORDER — DIPHENHYDRAMINE HCL 25 MG PO CAPS
25.0000 mg | ORAL_CAPSULE | Freq: Once | ORAL | Status: AC
  Administered 2014-04-04: 25 mg via ORAL

## 2014-04-04 NOTE — Patient Instructions (Signed)
[facility] Discharge Instructions for Patients Receiving Chemotherapy  Today you received the following chemotherapy agents: Herceptin and Perjeta  To help prevent nausea and vomiting after your treatment, we encourage you to take your nausea medication: Zofran 8 g every 8 hours.   If you develop nausea and vomiting that is not controlled by your nausea medication, call the clinic.   BELOW ARE SYMPTOMS THAT SHOULD BE REPORTED IMMEDIATELY:  *FEVER GREATER THAN 100.5 F  *CHILLS WITH OR WITHOUT FEVER  NAUSEA AND VOMITING THAT IS NOT CONTROLLED WITH YOUR NAUSEA MEDICATION  *UNUSUAL SHORTNESS OF BREATH  *UNUSUAL BRUISING OR BLEEDING  TENDERNESS IN MOUTH AND THROAT WITH OR WITHOUT PRESENCE OF ULCERS  *URINARY PROBLEMS  *BOWEL PROBLEMS  UNUSUAL RASH Items with * indicate a potential emergency and should be followed up as soon as possible.  Feel free to call the clinic you have any questions or concerns. The clinic phone number is [phone].
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
## 2014-04-04 NOTE — Patient Instructions (Signed)

## 2014-04-04 NOTE — Progress Notes (Signed)
04/04/14 - [facility] study - Patient into the cancer center today for her routine visit.  She completed her PRO's in the infusion area. I thanked the patient for her continued support of this clinical trial.

## 2014-04-07 ENCOUNTER — Telehealth: Payer: Self-pay | Admitting: Oncology

## 2014-04-07 NOTE — Telephone Encounter (Signed)
, °

## 2014-04-14 ENCOUNTER — Ambulatory Visit: Payer: Medicare Other

## 2014-04-14 ENCOUNTER — Other Ambulatory Visit: Payer: Medicare Other

## 2014-04-25 ENCOUNTER — Other Ambulatory Visit (HOSPITAL_BASED_OUTPATIENT_CLINIC_OR_DEPARTMENT_OTHER): Payer: Medicare Other

## 2014-04-25 ENCOUNTER — Ambulatory Visit (HOSPITAL_BASED_OUTPATIENT_CLINIC_OR_DEPARTMENT_OTHER): Payer: Medicare Other

## 2014-04-25 ENCOUNTER — Other Ambulatory Visit: Payer: Self-pay | Admitting: Oncology

## 2014-04-25 ENCOUNTER — Other Ambulatory Visit: Payer: Self-pay | Admitting: *Deleted

## 2014-04-25 DIAGNOSIS — C50219 Malignant neoplasm of upper-inner quadrant of unspecified female breast: Secondary | ICD-10-CM

## 2014-04-25 DIAGNOSIS — C7800 Secondary malignant neoplasm of unspecified lung: Principal | ICD-10-CM

## 2014-04-25 DIAGNOSIS — C50212 Malignant neoplasm of upper-inner quadrant of left female breast: Secondary | ICD-10-CM

## 2014-04-25 DIAGNOSIS — C50919 Malignant neoplasm of unspecified site of unspecified female breast: Secondary | ICD-10-CM

## 2014-04-25 DIAGNOSIS — Z5112 Encounter for antineoplastic immunotherapy: Secondary | ICD-10-CM

## 2014-04-25 LAB — COMPREHENSIVE METABOLIC PANEL (CC13)
ALT: 64 U/L — ABNORMAL HIGH (ref 0–55)
AST: 41 U/L — ABNORMAL HIGH (ref 5–34)
Albumin: 3.2 g/dL — ABNORMAL LOW (ref 3.5–5.0)
Alkaline Phosphatase: 123 U/L (ref 40–150)
Anion Gap: 11 mEq/L (ref 3–11)
BUN: 15.2 mg/dL (ref 7.0–26.0)
CO2: 26 mEq/L (ref 22–29)
Calcium: 10 mg/dL (ref 8.4–10.4)
Chloride: 101 mEq/L (ref 98–109)
Creatinine: 1 mg/dL (ref 0.6–1.1)
Glucose: 242 mg/dl — ABNORMAL HIGH (ref 70–140)
Potassium: 4.2 mEq/L (ref 3.5–5.1)
Sodium: 138 mEq/L (ref 136–145)
Total Bilirubin: 0.42 mg/dL (ref 0.20–1.20)
Total Protein: 7.9 g/dL (ref 6.4–8.3)

## 2014-04-25 LAB — CBC WITH DIFFERENTIAL/PLATELET
BASO%: 0.3 % (ref 0.0–2.0)
Basophils Absolute: 0 10*3/uL (ref 0.0–0.1)
EOS%: 2.2 % (ref 0.0–7.0)
Eosinophils Absolute: 0.1 10*3/uL (ref 0.0–0.5)
HCT: 44.8 % (ref 34.8–46.6)
HGB: 14.9 g/dL (ref 11.6–15.9)
LYMPH%: 35.1 % (ref 14.0–49.7)
MCH: 28.5 pg (ref 25.1–34.0)
MCHC: 33.3 g/dL (ref 31.5–36.0)
MCV: 85.7 fL (ref 79.5–101.0)
MONO#: 0.4 10*3/uL (ref 0.1–0.9)
MONO%: 5.7 % (ref 0.0–14.0)
NEUT#: 3.7 10*3/uL (ref 1.5–6.5)
NEUT%: 56.7 % (ref 38.4–76.8)
Platelets: 273 10*3/uL (ref 145–400)
RBC: 5.23 10*6/uL (ref 3.70–5.45)
RDW: 14.5 % (ref 11.2–14.5)
WBC: 6.5 10*3/uL (ref 3.9–10.3)
lymph#: 2.3 10*3/uL (ref 0.9–3.3)

## 2014-04-25 MED ORDER — DIPHENHYDRAMINE HCL 25 MG PO CAPS
ORAL_CAPSULE | ORAL | Status: AC
  Filled 2014-04-25: qty 1

## 2014-04-25 MED ORDER — LORAZEPAM 2 MG/ML IJ SOLN
0.5000 mg | Freq: Once | INTRAMUSCULAR | Status: AC
  Administered 2014-04-25: 0.5 mg via INTRAVENOUS

## 2014-04-25 MED ORDER — SODIUM CHLORIDE 0.9 % IJ SOLN
10.0000 mL | INTRAMUSCULAR | Status: DC | PRN
  Administered 2014-04-25: 10 mL
  Filled 2014-04-25: qty 10

## 2014-04-25 MED ORDER — TRASTUZUMAB CHEMO INJECTION 440 MG
6.0000 mg/kg | Freq: Once | INTRAVENOUS | Status: AC
  Administered 2014-04-25: 903 mg via INTRAVENOUS
  Filled 2014-04-25: qty 43

## 2014-04-25 MED ORDER — HEPARIN SOD (PORK) LOCK FLUSH 100 UNIT/ML IV SOLN
500.0000 [IU] | Freq: Once | INTRAVENOUS | Status: AC | PRN
  Administered 2014-04-25: 500 [IU]
  Filled 2014-04-25: qty 5

## 2014-04-25 MED ORDER — ACETAMINOPHEN 325 MG PO TABS
ORAL_TABLET | ORAL | Status: AC
  Filled 2014-04-25: qty 2

## 2014-04-25 MED ORDER — DIPHENHYDRAMINE HCL 25 MG PO CAPS
25.0000 mg | ORAL_CAPSULE | Freq: Once | ORAL | Status: AC
  Administered 2014-04-25: 25 mg via ORAL

## 2014-04-25 MED ORDER — SODIUM CHLORIDE 0.9 % IV SOLN
Freq: Once | INTRAVENOUS | Status: AC
  Administered 2014-04-25: 11:00:00 via INTRAVENOUS

## 2014-04-25 MED ORDER — ACETAMINOPHEN 325 MG PO TABS
650.0000 mg | ORAL_TABLET | Freq: Once | ORAL | Status: AC
  Administered 2014-04-25: 650 mg via ORAL

## 2014-04-25 MED ORDER — SODIUM CHLORIDE 0.9 % IV SOLN
420.0000 mg | Freq: Once | INTRAVENOUS | Status: AC
  Administered 2014-04-25: 420 mg via INTRAVENOUS
  Filled 2014-04-25: qty 14

## 2014-04-25 MED ORDER — LORAZEPAM 2 MG/ML IJ SOLN
INTRAMUSCULAR | Status: AC
  Filled 2014-04-25: qty 1

## 2014-04-25 NOTE — Patient Instructions (Signed)
[facility] Discharge Instructions for Patients Receiving Chemotherapy  Today you received the following chemotherapy agents Herceptin and Perjeta.  To help prevent nausea and vomiting after your treatment, we encourage you to take your nausea medication Zofran 8 mg every 12 hours or Reglan 10 mg every 6 hours as needed.   If you develop nausea and vomiting that is not controlled by your nausea medication, call the clinic.   BELOW ARE SYMPTOMS THAT SHOULD BE REPORTED IMMEDIATELY:  *FEVER GREATER THAN 100.5 F  *CHILLS WITH OR WITHOUT FEVER  NAUSEA AND VOMITING THAT IS NOT CONTROLLED WITH YOUR NAUSEA MEDICATION  *UNUSUAL SHORTNESS OF BREATH  *UNUSUAL BRUISING OR BLEEDING  TENDERNESS IN MOUTH AND THROAT WITH OR WITHOUT PRESENCE OF ULCERS  *URINARY PROBLEMS  *BOWEL PROBLEMS  UNUSUAL RASH Items with * indicate a potential emergency and should be followed up as soon as possible.  Feel free to call the clinic you have any questions or concerns. The clinic phone number is [phone].

## 2014-05-09 ENCOUNTER — Telehealth: Payer: Self-pay | Admitting: *Deleted

## 2014-05-09 NOTE — Telephone Encounter (Signed)
This RN received and returned call from pt per her request to cancel appointments on 9/28 for lab and MD-  Per pt's message she states multiple appointments occuring " and just too long of a wait from my MD to the ECHO ".  This RN upon 3rd return call left message on number identified VM stating need to maintain above appointment due to need for MD to receive pt's status for ongoing therapy.

## 2014-05-09 NOTE — Telephone Encounter (Signed)
This RN was able to speak with pt directly per her appointment concerns.  Her primary concern is that the ECHO is scheduled after her visit with the MD.  Per discussion of above this RN rescheduled lab and follow up to following day with midlevel prior to chemo appointment.

## 2014-05-15 ENCOUNTER — Ambulatory Visit: Payer: Medicare Other | Admitting: Oncology

## 2014-05-15 ENCOUNTER — Other Ambulatory Visit: Payer: Medicare Other

## 2014-05-15 ENCOUNTER — Other Ambulatory Visit: Payer: Self-pay | Admitting: *Deleted

## 2014-05-15 ENCOUNTER — Ambulatory Visit (HOSPITAL_COMMUNITY)
Admission: RE | Admit: 2014-05-15 | Discharge: 2014-05-15 | Disposition: A | Discharge status: Home / Self Care | Payer: Medicare Other | Source: Ambulatory Visit | Attending: Oncology | Admitting: Oncology

## 2014-05-15 DIAGNOSIS — C50919 Malignant neoplasm of unspecified site of unspecified female breast: Secondary | ICD-10-CM

## 2014-05-15 DIAGNOSIS — Z6841 Body Mass Index (BMI) 40.0 and over, adult: Secondary | ICD-10-CM | POA: Insufficient documentation

## 2014-05-15 DIAGNOSIS — I369 Nonrheumatic tricuspid valve disorder, unspecified: Secondary | ICD-10-CM

## 2014-05-15 DIAGNOSIS — E119 Type 2 diabetes mellitus without complications: Secondary | ICD-10-CM | POA: Diagnosis not present

## 2014-05-15 DIAGNOSIS — I10 Essential (primary) hypertension: Secondary | ICD-10-CM | POA: Diagnosis not present

## 2014-05-15 DIAGNOSIS — Z79899 Other long term (current) drug therapy: Secondary | ICD-10-CM | POA: Insufficient documentation

## 2014-05-15 DIAGNOSIS — C7800 Secondary malignant neoplasm of unspecified lung: Principal | ICD-10-CM

## 2014-05-15 DIAGNOSIS — C50219 Malignant neoplasm of upper-inner quadrant of unspecified female breast: Secondary | ICD-10-CM | POA: Diagnosis not present

## 2014-05-16 ENCOUNTER — Encounter: Payer: Self-pay | Admitting: Adult Health

## 2014-05-16 ENCOUNTER — Ambulatory Visit (HOSPITAL_BASED_OUTPATIENT_CLINIC_OR_DEPARTMENT_OTHER): Payer: Medicare Other

## 2014-05-16 ENCOUNTER — Other Ambulatory Visit (HOSPITAL_BASED_OUTPATIENT_CLINIC_OR_DEPARTMENT_OTHER): Payer: Medicare Other

## 2014-05-16 ENCOUNTER — Ambulatory Visit (HOSPITAL_BASED_OUTPATIENT_CLINIC_OR_DEPARTMENT_OTHER): Payer: Medicare Other | Admitting: Adult Health

## 2014-05-16 ENCOUNTER — Other Ambulatory Visit: Payer: Medicare Other

## 2014-05-16 VITALS — BP 142/74 | HR 69 | Temp 98.2°F | Resp 18 | Ht 63.0 in | Wt 323.4 lb

## 2014-05-16 DIAGNOSIS — C50212 Malignant neoplasm of upper-inner quadrant of left female breast: Secondary | ICD-10-CM

## 2014-05-16 DIAGNOSIS — Z5112 Encounter for antineoplastic immunotherapy: Secondary | ICD-10-CM

## 2014-05-16 DIAGNOSIS — C7800 Secondary malignant neoplasm of unspecified lung: Secondary | ICD-10-CM

## 2014-05-16 DIAGNOSIS — C50919 Malignant neoplasm of unspecified site of unspecified female breast: Secondary | ICD-10-CM

## 2014-05-16 DIAGNOSIS — C50219 Malignant neoplasm of upper-inner quadrant of unspecified female breast: Secondary | ICD-10-CM

## 2014-05-16 DIAGNOSIS — Z171 Estrogen receptor negative status [ER-]: Secondary | ICD-10-CM

## 2014-05-16 LAB — CBC WITH DIFFERENTIAL/PLATELET
BASO%: 0.2 % (ref 0.0–2.0)
Basophils Absolute: 0 10*3/uL (ref 0.0–0.1)
EOS%: 2 % (ref 0.0–7.0)
Eosinophils Absolute: 0.1 10*3/uL (ref 0.0–0.5)
HCT: 43 % (ref 34.8–46.6)
HGB: 13.9 g/dL (ref 11.6–15.9)
LYMPH%: 35 % (ref 14.0–49.7)
MCH: 28.1 pg (ref 25.1–34.0)
MCHC: 32.3 g/dL (ref 31.5–36.0)
MCV: 87 fL (ref 79.5–101.0)
MONO#: 0.4 10*3/uL (ref 0.1–0.9)
MONO%: 6.2 % (ref 0.0–14.0)
NEUT#: 3.6 10*3/uL (ref 1.5–6.5)
NEUT%: 56.6 % (ref 38.4–76.8)
Platelets: 258 10*3/uL (ref 145–400)
RBC: 4.94 10*6/uL (ref 3.70–5.45)
RDW: 14.3 % (ref 11.2–14.5)
WBC: 6.4 10*3/uL (ref 3.9–10.3)
lymph#: 2.3 10*3/uL (ref 0.9–3.3)

## 2014-05-16 LAB — COMPREHENSIVE METABOLIC PANEL (CC13)
ALT: 56 U/L — ABNORMAL HIGH (ref 0–55)
AST: 34 U/L (ref 5–34)
Albumin: 3.2 g/dL — ABNORMAL LOW (ref 3.5–5.0)
Alkaline Phosphatase: 123 U/L (ref 40–150)
Anion Gap: 8 mEq/L (ref 3–11)
BUN: 16.4 mg/dL (ref 7.0–26.0)
CO2: 34 mEq/L — ABNORMAL HIGH (ref 22–29)
Calcium: 10 mg/dL (ref 8.4–10.4)
Chloride: 101 mEq/L (ref 98–109)
Creatinine: 0.8 mg/dL (ref 0.6–1.1)
Glucose: 176 mg/dl — ABNORMAL HIGH (ref 70–140)
Potassium: 4.1 mEq/L (ref 3.5–5.1)
Sodium: 142 mEq/L (ref 136–145)
Total Bilirubin: 0.35 mg/dL (ref 0.20–1.20)
Total Protein: 7.7 g/dL (ref 6.4–8.3)

## 2014-05-16 MED ORDER — SODIUM CHLORIDE 0.9 % IV SOLN
Freq: Once | INTRAVENOUS | Status: AC
  Administered 2014-05-16: 12:00:00 via INTRAVENOUS

## 2014-05-16 MED ORDER — DIPHENHYDRAMINE HCL 25 MG PO CAPS
ORAL_CAPSULE | ORAL | Status: AC
  Filled 2014-05-16: qty 1

## 2014-05-16 MED ORDER — ACETAMINOPHEN 325 MG PO TABS
650.0000 mg | ORAL_TABLET | Freq: Once | ORAL | Status: AC
  Administered 2014-05-16: 650 mg via ORAL

## 2014-05-16 MED ORDER — ACETAMINOPHEN 325 MG PO TABS
ORAL_TABLET | ORAL | Status: AC
  Filled 2014-05-16: qty 2

## 2014-05-16 MED ORDER — SODIUM CHLORIDE 0.9 % IV SOLN
420.0000 mg | Freq: Once | INTRAVENOUS | Status: AC
  Administered 2014-05-16: 420 mg via INTRAVENOUS
  Filled 2014-05-16: qty 14

## 2014-05-16 MED ORDER — LORAZEPAM 2 MG/ML IJ SOLN
INTRAMUSCULAR | Status: AC
  Filled 2014-05-16: qty 1

## 2014-05-16 MED ORDER — HEPARIN SOD (PORK) LOCK FLUSH 100 UNIT/ML IV SOLN
500.0000 [IU] | Freq: Once | INTRAVENOUS | Status: AC | PRN
  Administered 2014-05-16: 500 [IU]
  Filled 2014-05-16: qty 5

## 2014-05-16 MED ORDER — DIPHENHYDRAMINE HCL 25 MG PO CAPS
25.0000 mg | ORAL_CAPSULE | Freq: Once | ORAL | Status: AC
  Administered 2014-05-16: 25 mg via ORAL

## 2014-05-16 MED ORDER — LORAZEPAM 2 MG/ML IJ SOLN
0.5000 mg | Freq: Once | INTRAMUSCULAR | Status: AC
  Administered 2014-05-16: 0.5 mg via INTRAVENOUS

## 2014-05-16 MED ORDER — TRASTUZUMAB CHEMO INJECTION 440 MG
6.0000 mg/kg | Freq: Once | INTRAVENOUS | Status: AC
  Administered 2014-05-16: 903 mg via INTRAVENOUS
  Filled 2014-05-16: qty 43

## 2014-05-16 MED ORDER — SODIUM CHLORIDE 0.9 % IJ SOLN
10.0000 mL | INTRAMUSCULAR | Status: DC | PRN
  Administered 2014-05-16: 10 mL
  Filled 2014-05-16: qty 10

## 2014-05-16 NOTE — Addendum Note (Signed)
Addended by: Minette Headland on: 05/16/2014 10:22 PM   Modules accepted: Orders
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
## 2014-05-17 ENCOUNTER — Encounter: Payer: Self-pay | Admitting: Oncology

## 2014-05-17 NOTE — Progress Notes (Signed)
Per Mendel Ryder check with patient about possible asst. I called and had to leave a message for patient to call me back.

## 2014-05-18 ENCOUNTER — Encounter: Payer: Self-pay | Admitting: Oncology

## 2014-05-18 NOTE — Progress Notes (Signed)
Called and left a message for the patient again. She has asst with [facility] and has insurance- not sure what asst she is needing per Ria Comment.

## 2014-05-31 ENCOUNTER — Other Ambulatory Visit: Payer: Self-pay | Admitting: Emergency Medicine

## 2014-06-01 ENCOUNTER — Encounter: Payer: Self-pay | Admitting: *Deleted

## 2014-06-01 ENCOUNTER — Telehealth: Payer: Self-pay | Admitting: *Deleted

## 2014-06-01 ENCOUNTER — Other Ambulatory Visit: Payer: Self-pay | Admitting: *Deleted

## 2014-06-01 ENCOUNTER — Other Ambulatory Visit: Payer: Self-pay | Admitting: Oncology

## 2014-06-01 NOTE — Telephone Encounter (Signed)
Per staff message I have scheduled appts

## 2014-06-02 ENCOUNTER — Telehealth: Payer: Self-pay | Admitting: Oncology

## 2014-06-02 ENCOUNTER — Telehealth: Payer: Self-pay | Admitting: *Deleted

## 2014-06-02 NOTE — Telephone Encounter (Signed)
, °

## 2014-06-02 NOTE — Telephone Encounter (Signed)
Per staff message I have adjsuted appt

## 2014-06-05 ENCOUNTER — Other Ambulatory Visit: Payer: Self-pay | Admitting: *Deleted

## 2014-06-05 DIAGNOSIS — C7800 Secondary malignant neoplasm of unspecified lung: Principal | ICD-10-CM

## 2014-06-05 DIAGNOSIS — C50919 Malignant neoplasm of unspecified site of unspecified female breast: Secondary | ICD-10-CM

## 2014-06-05 MED ORDER — ONDANSETRON HCL 8 MG PO TABS: 8.0000 mg | ORAL_TABLET | Freq: Two times a day (BID) | ORAL | Status: DC | PRN

## 2014-06-06 ENCOUNTER — Other Ambulatory Visit (HOSPITAL_BASED_OUTPATIENT_CLINIC_OR_DEPARTMENT_OTHER): Payer: Medicare Other

## 2014-06-06 ENCOUNTER — Ambulatory Visit (HOSPITAL_BASED_OUTPATIENT_CLINIC_OR_DEPARTMENT_OTHER): Payer: Medicare Other

## 2014-06-06 VITALS — BP 141/75 | HR 65 | Temp 98.1°F | Resp 19

## 2014-06-06 DIAGNOSIS — Z5112 Encounter for antineoplastic immunotherapy: Secondary | ICD-10-CM

## 2014-06-06 DIAGNOSIS — C7800 Secondary malignant neoplasm of unspecified lung: Secondary | ICD-10-CM

## 2014-06-06 DIAGNOSIS — C50212 Malignant neoplasm of upper-inner quadrant of left female breast: Secondary | ICD-10-CM

## 2014-06-06 LAB — CBC WITH DIFFERENTIAL/PLATELET
BASO%: 0.1 % (ref 0.0–2.0)
Basophils Absolute: 0 10*3/uL (ref 0.0–0.1)
EOS%: 2.3 % (ref 0.0–7.0)
Eosinophils Absolute: 0.2 10*3/uL (ref 0.0–0.5)
HCT: 41.7 % (ref 34.8–46.6)
HGB: 13.7 g/dL (ref 11.6–15.9)
LYMPH%: 32.3 % (ref 14.0–49.7)
MCH: 28.3 pg (ref 25.1–34.0)
MCHC: 32.9 g/dL (ref 31.5–36.0)
MCV: 86.2 fL (ref 79.5–101.0)
MONO#: 0.4 10*3/uL (ref 0.1–0.9)
MONO%: 5.3 % (ref 0.0–14.0)
NEUT#: 4.4 10*3/uL (ref 1.5–6.5)
NEUT%: 60 % (ref 38.4–76.8)
Platelets: 244 10*3/uL (ref 145–400)
RBC: 4.84 10*6/uL (ref 3.70–5.45)
RDW: 14.1 % (ref 11.2–14.5)
WBC: 7.4 10*3/uL (ref 3.9–10.3)
lymph#: 2.4 10*3/uL (ref 0.9–3.3)

## 2014-06-06 LAB — COMPREHENSIVE METABOLIC PANEL (CC13)
ALT: 60 U/L — ABNORMAL HIGH (ref 0–55)
AST: 39 U/L — ABNORMAL HIGH (ref 5–34)
Albumin: 3.1 g/dL — ABNORMAL LOW (ref 3.5–5.0)
Alkaline Phosphatase: 126 U/L (ref 40–150)
Anion Gap: 9 mEq/L (ref 3–11)
BUN: 15 mg/dL (ref 7.0–26.0)
CO2: 30 mEq/L — ABNORMAL HIGH (ref 22–29)
Calcium: 10 mg/dL (ref 8.4–10.4)
Chloride: 103 mEq/L (ref 98–109)
Creatinine: 0.9 mg/dL (ref 0.6–1.1)
Glucose: 171 mg/dl — ABNORMAL HIGH (ref 70–140)
Potassium: 4.4 mEq/L (ref 3.5–5.1)
Sodium: 142 mEq/L (ref 136–145)
Total Bilirubin: 0.3 mg/dL (ref 0.20–1.20)
Total Protein: 7.3 g/dL (ref 6.4–8.3)

## 2014-06-06 MED ORDER — DIPHENHYDRAMINE HCL 25 MG PO CAPS
25.0000 mg | ORAL_CAPSULE | Freq: Once | ORAL | Status: AC
  Administered 2014-06-06: 25 mg via ORAL

## 2014-06-06 MED ORDER — SODIUM CHLORIDE 0.9 % IV SOLN
420.0000 mg | Freq: Once | INTRAVENOUS | Status: AC
  Administered 2014-06-06: 420 mg via INTRAVENOUS
  Filled 2014-06-06: qty 14

## 2014-06-06 MED ORDER — ACETAMINOPHEN 325 MG PO TABS
650.0000 mg | ORAL_TABLET | Freq: Once | ORAL | Status: AC
  Administered 2014-06-06: 650 mg via ORAL

## 2014-06-06 MED ORDER — LORAZEPAM 2 MG/ML IJ SOLN
INTRAMUSCULAR | Status: AC
  Filled 2014-06-06: qty 1

## 2014-06-06 MED ORDER — HEPARIN SOD (PORK) LOCK FLUSH 100 UNIT/ML IV SOLN
500.0000 [IU] | Freq: Once | INTRAVENOUS | Status: DC | PRN
  Filled 2014-06-06: qty 5

## 2014-06-06 MED ORDER — LORAZEPAM 2 MG/ML IJ SOLN
0.5000 mg | Freq: Once | INTRAMUSCULAR | Status: AC
  Administered 2014-06-06: 0.5 mg via INTRAVENOUS

## 2014-06-06 MED ORDER — SODIUM CHLORIDE 0.9 % IV SOLN
Freq: Once | INTRAVENOUS | Status: AC
  Administered 2014-06-06: 14:00:00 via INTRAVENOUS

## 2014-06-06 MED ORDER — ACETAMINOPHEN 325 MG PO TABS
ORAL_TABLET | ORAL | Status: AC
  Filled 2014-06-06: qty 2

## 2014-06-06 MED ORDER — TRASTUZUMAB CHEMO INJECTION 440 MG
6.0000 mg/kg | Freq: Once | INTRAVENOUS | Status: AC
  Administered 2014-06-06: 903 mg via INTRAVENOUS
  Filled 2014-06-06: qty 43

## 2014-06-06 MED ORDER — SODIUM CHLORIDE 0.9 % IJ SOLN
10.0000 mL | INTRAMUSCULAR | Status: DC | PRN
  Filled 2014-06-06: qty 10

## 2014-06-06 MED ORDER — DIPHENHYDRAMINE HCL 25 MG PO CAPS
ORAL_CAPSULE | ORAL | Status: AC
  Filled 2014-06-06: qty 1

## 2014-06-06 NOTE — Patient Instructions (Signed)
[facility] Discharge Instructions for Patients Receiving Chemotherapy  Today you received the following chemotherapy agents Herceptin/Perjeta  To help prevent nausea and vomiting after your treatment, we encourage you to take your nausea medication     If you develop nausea and vomiting that is not controlled by your nausea medication, call the clinic.   BELOW ARE SYMPTOMS THAT SHOULD BE REPORTED IMMEDIATELY:  *FEVER GREATER THAN 100.5 F  *CHILLS WITH OR WITHOUT FEVER  NAUSEA AND VOMITING THAT IS NOT CONTROLLED WITH YOUR NAUSEA MEDICATION  *UNUSUAL SHORTNESS OF BREATH  *UNUSUAL BRUISING OR BLEEDING  TENDERNESS IN MOUTH AND THROAT WITH OR WITHOUT PRESENCE OF ULCERS  *URINARY PROBLEMS  *BOWEL PROBLEMS  UNUSUAL RASH Items with * indicate a potential emergency and should be followed up as soon as possible.  Feel free to call the clinic you have any questions or concerns. The clinic phone number is [phone].

## 2014-06-08 ENCOUNTER — Other Ambulatory Visit: Payer: Self-pay | Admitting: *Deleted

## 2014-06-08 ENCOUNTER — Telehealth: Payer: Self-pay | Admitting: *Deleted

## 2014-06-08 MED ORDER — CIPROFLOXACIN HCL 500 MG PO TABS: 500.0000 mg | ORAL_TABLET | Freq: Two times a day (BID) | ORAL | Status: DC

## 2014-06-08 NOTE — Telephone Encounter (Signed)
[name] left message stating she has onset of " earache and would Dr Jannifer Rodney to order me an antibiotic which has has done for me in the past with this same problem ".  Return call number given as [phone].  This RN returned call to pt and inquired further.  Per Terrence Dupont she states " I have had swimmers ear problems for years and I always try to get the fluid out "  " I can tell when it goes into an infection - which last night the discomfort was worse - going from one ear to the other "  Per inquiry Christal denies any fever.  [facility] [address].  This note will be given to MD for review.

## 2014-06-20 ENCOUNTER — Ambulatory Visit (HOSPITAL_COMMUNITY)
Admission: RE | Admit: 2014-06-20 | Discharge: 2014-06-20 | Disposition: A | Discharge status: Home / Self Care | Payer: Medicare Other | Source: Ambulatory Visit | Attending: Diagnostic Radiology | Admitting: Diagnostic Radiology

## 2014-06-20 ENCOUNTER — Encounter (HOSPITAL_COMMUNITY): Payer: Self-pay

## 2014-06-20 DIAGNOSIS — R918 Other nonspecific abnormal finding of lung field: Secondary | ICD-10-CM | POA: Insufficient documentation

## 2014-06-20 DIAGNOSIS — C50212 Malignant neoplasm of upper-inner quadrant of left female breast: Secondary | ICD-10-CM | POA: Diagnosis present

## 2014-06-20 MED ORDER — IOHEXOL 300 MG/ML  SOLN
80.0000 mL | Freq: Once | INTRAMUSCULAR | Status: AC | PRN
Start: 2014-06-20 — End: 2014-06-20
  Administered 2014-06-20: 80 mL via INTRAVENOUS

## 2014-06-27 ENCOUNTER — Other Ambulatory Visit (HOSPITAL_BASED_OUTPATIENT_CLINIC_OR_DEPARTMENT_OTHER): Payer: Medicare Other

## 2014-06-27 ENCOUNTER — Other Ambulatory Visit: Payer: Medicare Other

## 2014-06-27 ENCOUNTER — Other Ambulatory Visit: Payer: Self-pay | Admitting: Oncology

## 2014-06-27 ENCOUNTER — Encounter: Payer: Self-pay | Admitting: *Deleted

## 2014-06-27 ENCOUNTER — Ambulatory Visit (HOSPITAL_BASED_OUTPATIENT_CLINIC_OR_DEPARTMENT_OTHER): Payer: Medicare Other | Admitting: Adult Health

## 2014-06-27 ENCOUNTER — Encounter: Payer: Self-pay | Admitting: Adult Health

## 2014-06-27 ENCOUNTER — Ambulatory Visit (HOSPITAL_BASED_OUTPATIENT_CLINIC_OR_DEPARTMENT_OTHER): Payer: Medicare Other

## 2014-06-27 VITALS — BP 132/87 | HR 79 | Temp 98.8°F | Resp 18 | Ht 63.0 in | Wt 319.4 lb

## 2014-06-27 DIAGNOSIS — Z5112 Encounter for antineoplastic immunotherapy: Secondary | ICD-10-CM

## 2014-06-27 DIAGNOSIS — I82622 Acute embolism and thrombosis of deep veins of left upper extremity: Secondary | ICD-10-CM

## 2014-06-27 DIAGNOSIS — Z171 Estrogen receptor negative status [ER-]: Secondary | ICD-10-CM

## 2014-06-27 DIAGNOSIS — C7800 Secondary malignant neoplasm of unspecified lung: Secondary | ICD-10-CM

## 2014-06-27 DIAGNOSIS — C50212 Malignant neoplasm of upper-inner quadrant of left female breast: Secondary | ICD-10-CM

## 2014-06-27 DIAGNOSIS — C7802 Secondary malignant neoplasm of left lung: Secondary | ICD-10-CM

## 2014-06-27 DIAGNOSIS — C50412 Malignant neoplasm of upper-outer quadrant of left female breast: Secondary | ICD-10-CM

## 2014-06-27 LAB — COMPREHENSIVE METABOLIC PANEL (CC13)
ALT: 53 U/L (ref 0–55)
AST: 33 U/L (ref 5–34)
Albumin: 3.2 g/dL — ABNORMAL LOW (ref 3.5–5.0)
Alkaline Phosphatase: 135 U/L (ref 40–150)
Anion Gap: 8 mEq/L (ref 3–11)
BUN: 15.6 mg/dL (ref 7.0–26.0)
CO2: 30 mEq/L — ABNORMAL HIGH (ref 22–29)
Calcium: 10 mg/dL (ref 8.4–10.4)
Chloride: 101 mEq/L (ref 98–109)
Creatinine: 0.9 mg/dL (ref 0.6–1.1)
Glucose: 240 mg/dl — ABNORMAL HIGH (ref 70–140)
Potassium: 4.2 mEq/L (ref 3.5–5.1)
Sodium: 139 mEq/L (ref 136–145)
Total Bilirubin: 0.41 mg/dL (ref 0.20–1.20)
Total Protein: 7.6 g/dL (ref 6.4–8.3)

## 2014-06-27 LAB — CBC WITH DIFFERENTIAL/PLATELET
BASO%: 0.4 % (ref 0.0–2.0)
Basophils Absolute: 0 10*3/uL (ref 0.0–0.1)
EOS%: 1.9 % (ref 0.0–7.0)
Eosinophils Absolute: 0.1 10*3/uL (ref 0.0–0.5)
HCT: 45.1 % (ref 34.8–46.6)
HGB: 14.6 g/dL (ref 11.6–15.9)
LYMPH%: 31 % (ref 14.0–49.7)
MCH: 27.7 pg (ref 25.1–34.0)
MCHC: 32.4 g/dL (ref 31.5–36.0)
MCV: 85.3 fL (ref 79.5–101.0)
MONO#: 0.3 10*3/uL (ref 0.1–0.9)
MONO%: 4.5 % (ref 0.0–14.0)
NEUT#: 4.6 10*3/uL (ref 1.5–6.5)
NEUT%: 62.2 % (ref 38.4–76.8)
Platelets: 266 10*3/uL (ref 145–400)
RBC: 5.28 10*6/uL (ref 3.70–5.45)
RDW: 14.5 % (ref 11.2–14.5)
WBC: 7.5 10*3/uL (ref 3.9–10.3)
lymph#: 2.3 10*3/uL (ref 0.9–3.3)

## 2014-06-27 MED ORDER — TRASTUZUMAB CHEMO INJECTION 440 MG
6.0000 mg/kg | Freq: Once | INTRAVENOUS | Status: AC
  Administered 2014-06-27: 903 mg via INTRAVENOUS
  Filled 2014-06-27: qty 43

## 2014-06-27 MED ORDER — ACETAMINOPHEN 325 MG PO TABS
ORAL_TABLET | ORAL | Status: AC
  Filled 2014-06-27: qty 2

## 2014-06-27 MED ORDER — ACETAMINOPHEN 325 MG PO TABS
650.0000 mg | ORAL_TABLET | Freq: Once | ORAL | Status: AC
  Administered 2014-06-27: 650 mg via ORAL

## 2014-06-27 MED ORDER — SODIUM CHLORIDE 0.9 % IV SOLN
Freq: Once | INTRAVENOUS | Status: AC
  Administered 2014-06-27: 14:00:00 via INTRAVENOUS

## 2014-06-27 MED ORDER — SODIUM CHLORIDE 0.9 % IJ SOLN
10.0000 mL | INTRAMUSCULAR | Status: DC | PRN
  Administered 2014-06-27: 10 mL
  Filled 2014-06-27: qty 10

## 2014-06-27 MED ORDER — DIPHENHYDRAMINE HCL 25 MG PO CAPS
ORAL_CAPSULE | ORAL | Status: AC
  Filled 2014-06-27: qty 1

## 2014-06-27 MED ORDER — LORAZEPAM 2 MG/ML IJ SOLN
0.5000 mg | Freq: Once | INTRAMUSCULAR | Status: AC
  Administered 2014-06-27: 0.5 mg via INTRAVENOUS

## 2014-06-27 MED ORDER — LORAZEPAM 2 MG/ML IJ SOLN
INTRAMUSCULAR | Status: AC
  Filled 2014-06-27: qty 1

## 2014-06-27 MED ORDER — DIPHENHYDRAMINE HCL 25 MG PO CAPS
25.0000 mg | ORAL_CAPSULE | Freq: Once | ORAL | Status: AC
  Administered 2014-06-27: 25 mg via ORAL

## 2014-06-27 MED ORDER — SODIUM CHLORIDE 0.9 % IV SOLN
420.0000 mg | Freq: Once | INTRAVENOUS | Status: AC
  Administered 2014-06-27: 420 mg via INTRAVENOUS
  Filled 2014-06-27: qty 14

## 2014-06-27 MED ORDER — HEPARIN SOD (PORK) LOCK FLUSH 100 UNIT/ML IV SOLN
500.0000 [IU] | Freq: Once | INTRAVENOUS | Status: AC | PRN
  Administered 2014-06-27: 500 [IU]
  Filled 2014-06-27: qty 5

## 2014-06-27 NOTE — Patient Instructions (Signed)
[facility] Discharge Instructions for Patients Receiving Chemotherapy  Today you received the following chemotherapy agents Herceptin and Perjeta  To help prevent nausea and vomiting after your treatment, we encourage you to take your nausea medication as prescribed   If you develop nausea and vomiting that is not controlled by your nausea medication, call the clinic.   BELOW ARE SYMPTOMS THAT SHOULD BE REPORTED IMMEDIATELY:  *FEVER GREATER THAN 100.5 F  *CHILLS WITH OR WITHOUT FEVER  NAUSEA AND VOMITING THAT IS NOT CONTROLLED WITH YOUR NAUSEA MEDICATION  *UNUSUAL SHORTNESS OF BREATH  *UNUSUAL BRUISING OR BLEEDING  TENDERNESS IN MOUTH AND THROAT WITH OR WITHOUT PRESENCE OF ULCERS  *URINARY PROBLEMS  *BOWEL PROBLEMS  UNUSUAL RASH Items with * indicate a potential emergency and should be followed up as soon as possible.  Feel free to call the clinic you have any questions or concerns. The clinic phone number is [phone].

## 2014-06-27 NOTE — Progress Notes (Signed)
[name] [id number] study 06/27/2014  1230 Met with patient and introduced self to patient, explained this RN was taking previous research RN's place. This RN, along with Remer Macho, Research Assistant, assisted patient with completing her ePRO's.  Patient had to re-set her password and was able to successfully complete questionnaires.  This RN thanked patient for her continued participation on the study.
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
## 2014-06-29 ENCOUNTER — Telehealth: Payer: Self-pay | Admitting: *Deleted

## 2014-06-29 NOTE — Telephone Encounter (Signed)
Per staff message and POF I have scheduled appts. Advised scheduler of appts. JMW  

## 2014-07-02 ENCOUNTER — Other Ambulatory Visit: Payer: Self-pay | Admitting: Family Medicine

## 2014-07-03 ENCOUNTER — Telehealth: Payer: Self-pay | Admitting: Oncology

## 2014-07-03 ENCOUNTER — Other Ambulatory Visit: Payer: Self-pay | Admitting: Oncology

## 2014-07-03 NOTE — Telephone Encounter (Signed)
, °

## 2014-07-18 ENCOUNTER — Ambulatory Visit (HOSPITAL_BASED_OUTPATIENT_CLINIC_OR_DEPARTMENT_OTHER): Payer: Medicare Other

## 2014-07-18 ENCOUNTER — Other Ambulatory Visit (HOSPITAL_BASED_OUTPATIENT_CLINIC_OR_DEPARTMENT_OTHER): Payer: Medicare Other

## 2014-07-18 DIAGNOSIS — C50212 Malignant neoplasm of upper-inner quadrant of left female breast: Secondary | ICD-10-CM

## 2014-07-18 DIAGNOSIS — Z5112 Encounter for antineoplastic immunotherapy: Secondary | ICD-10-CM

## 2014-07-18 DIAGNOSIS — C7802 Secondary malignant neoplasm of left lung: Secondary | ICD-10-CM

## 2014-07-18 LAB — COMPREHENSIVE METABOLIC PANEL (CC13)
ALT: 66 U/L — ABNORMAL HIGH (ref 0–55)
AST: 40 U/L — ABNORMAL HIGH (ref 5–34)
Albumin: 3.3 g/dL — ABNORMAL LOW (ref 3.5–5.0)
Alkaline Phosphatase: 148 U/L (ref 40–150)
Anion Gap: 12 mEq/L — ABNORMAL HIGH (ref 3–11)
BUN: 14.4 mg/dL (ref 7.0–26.0)
CO2: 30 mEq/L — ABNORMAL HIGH (ref 22–29)
Calcium: 10.4 mg/dL (ref 8.4–10.4)
Chloride: 97 mEq/L — ABNORMAL LOW (ref 98–109)
Creatinine: 0.9 mg/dL (ref 0.6–1.1)
Glucose: 229 mg/dl — ABNORMAL HIGH (ref 70–140)
Potassium: 4.6 mEq/L (ref 3.5–5.1)
Sodium: 140 mEq/L (ref 136–145)
Total Bilirubin: 0.42 mg/dL (ref 0.20–1.20)
Total Protein: 7.7 g/dL (ref 6.4–8.3)

## 2014-07-18 LAB — CBC WITH DIFFERENTIAL/PLATELET
BASO%: 0.3 % (ref 0.0–2.0)
Basophils Absolute: 0 10*3/uL (ref 0.0–0.1)
EOS%: 1.4 % (ref 0.0–7.0)
Eosinophils Absolute: 0.1 10*3/uL (ref 0.0–0.5)
HCT: 46.4 % (ref 34.8–46.6)
HGB: 15.2 g/dL (ref 11.6–15.9)
LYMPH%: 32 % (ref 14.0–49.7)
MCH: 28.3 pg (ref 25.1–34.0)
MCHC: 32.8 g/dL (ref 31.5–36.0)
MCV: 86.4 fL (ref 79.5–101.0)
MONO#: 0.4 10*3/uL (ref 0.1–0.9)
MONO%: 5.9 % (ref 0.0–14.0)
NEUT#: 3.8 10*3/uL (ref 1.5–6.5)
NEUT%: 60.4 % (ref 38.4–76.8)
Platelets: 264 10*3/uL (ref 145–400)
RBC: 5.37 10*6/uL (ref 3.70–5.45)
RDW: 14 % (ref 11.2–14.5)
WBC: 6.3 10*3/uL (ref 3.9–10.3)
lymph#: 2 10*3/uL (ref 0.9–3.3)
nRBC: 0 % (ref 0–0)

## 2014-07-18 MED ORDER — HEPARIN SOD (PORK) LOCK FLUSH 100 UNIT/ML IV SOLN
500.0000 [IU] | Freq: Once | INTRAVENOUS | Status: AC | PRN
  Administered 2014-07-18: 500 [IU]
  Filled 2014-07-18: qty 5

## 2014-07-18 MED ORDER — SODIUM CHLORIDE 0.9 % IV SOLN
Freq: Once | INTRAVENOUS | Status: AC
  Administered 2014-07-18: 12:00:00 via INTRAVENOUS

## 2014-07-18 MED ORDER — DIPHENHYDRAMINE HCL 25 MG PO CAPS
ORAL_CAPSULE | ORAL | Status: AC
  Filled 2014-07-18: qty 1

## 2014-07-18 MED ORDER — LORAZEPAM 2 MG/ML IJ SOLN
0.5000 mg | Freq: Once | INTRAMUSCULAR | Status: AC
  Administered 2014-07-18: 0.5 mg via INTRAVENOUS

## 2014-07-18 MED ORDER — TRASTUZUMAB CHEMO INJECTION 440 MG
6.0000 mg/kg | Freq: Once | INTRAVENOUS | Status: AC
  Administered 2014-07-18: 903 mg via INTRAVENOUS
  Filled 2014-07-18: qty 43

## 2014-07-18 MED ORDER — SODIUM CHLORIDE 0.9 % IJ SOLN
10.0000 mL | INTRAMUSCULAR | Status: DC | PRN
  Administered 2014-07-18: 10 mL
  Filled 2014-07-18: qty 10

## 2014-07-18 MED ORDER — DIPHENHYDRAMINE HCL 25 MG PO CAPS
25.0000 mg | ORAL_CAPSULE | Freq: Once | ORAL | Status: AC
  Administered 2014-07-18: 25 mg via ORAL

## 2014-07-18 MED ORDER — SODIUM CHLORIDE 0.9 % IV SOLN
420.0000 mg | Freq: Once | INTRAVENOUS | Status: AC
  Administered 2014-07-18: 420 mg via INTRAVENOUS
  Filled 2014-07-18: qty 14

## 2014-07-18 MED ORDER — ACETAMINOPHEN 325 MG PO TABS
ORAL_TABLET | ORAL | Status: AC
  Filled 2014-07-18: qty 2

## 2014-07-18 MED ORDER — LORAZEPAM 2 MG/ML IJ SOLN
INTRAMUSCULAR | Status: AC
  Filled 2014-07-18: qty 1

## 2014-07-18 MED ORDER — ACETAMINOPHEN 325 MG PO TABS
650.0000 mg | ORAL_TABLET | Freq: Once | ORAL | Status: AC
  Administered 2014-07-18: 650 mg via ORAL

## 2014-07-18 NOTE — Patient Instructions (Signed)
[facility] Discharge Instructions for Patients Receiving Chemotherapy  Today you received the following chemotherapy agents herceptin/perjeta  To help prevent nausea and vomiting after your treatment, we encourage you to take your nausea medication as directed   If you develop nausea and vomiting that is not controlled by your nausea medication, call the clinic.   BELOW ARE SYMPTOMS THAT SHOULD BE REPORTED IMMEDIATELY:  *FEVER GREATER THAN 100.5 F  *CHILLS WITH OR WITHOUT FEVER  NAUSEA AND VOMITING THAT IS NOT CONTROLLED WITH YOUR NAUSEA MEDICATION  *UNUSUAL SHORTNESS OF BREATH  *UNUSUAL BRUISING OR BLEEDING  TENDERNESS IN MOUTH AND THROAT WITH OR WITHOUT PRESENCE OF ULCERS  *URINARY PROBLEMS  *BOWEL PROBLEMS  UNUSUAL RASH Items with * indicate a potential emergency and should be followed up as soon as possible.  Feel free to call the clinic you have any questions or concerns. The clinic phone number is [phone].

## 2014-07-21 ENCOUNTER — Telehealth: Payer: Self-pay | Admitting: *Deleted

## 2014-07-21 MED ORDER — SULFAMETHOXAZOLE-TRIMETHOPRIM 800-160 MG PO TABS: 1.0000 | ORAL_TABLET | Freq: Two times a day (BID) | ORAL | Status: DC

## 2014-07-21 NOTE — Telephone Encounter (Signed)
Per MD antibiotic prescription sent to pharmacy.

## 2014-07-21 NOTE — Telephone Encounter (Signed)
Pt called stating onset of " another ear infection ".  Per call Nayelli states she is prone to eat infections recurring during the winter.  Per call Laverta states noted left ear discomfort " with a swollen area behing the left ear " " I am having increased pain especially with swallowing"  Pt denies any fever or discharge.  Ranetta is requesting an antibiotic " and something for pain if possible"  Return call number given as [phone].

## 2014-07-25 ENCOUNTER — Other Ambulatory Visit: Payer: Self-pay | Admitting: Family Medicine

## 2014-07-31 ENCOUNTER — Other Ambulatory Visit: Payer: Self-pay | Admitting: Family Medicine

## 2014-08-08 ENCOUNTER — Ambulatory Visit (HOSPITAL_BASED_OUTPATIENT_CLINIC_OR_DEPARTMENT_OTHER): Payer: Medicare Other

## 2014-08-08 ENCOUNTER — Other Ambulatory Visit: Payer: Self-pay | Admitting: Oncology

## 2014-08-08 DIAGNOSIS — Z5112 Encounter for antineoplastic immunotherapy: Secondary | ICD-10-CM

## 2014-08-08 DIAGNOSIS — C50212 Malignant neoplasm of upper-inner quadrant of left female breast: Secondary | ICD-10-CM

## 2014-08-08 LAB — COMPREHENSIVE METABOLIC PANEL (CC13)
ALT: 62 U/L — ABNORMAL HIGH (ref 0–55)
AST: 30 U/L (ref 5–34)
Albumin: 3.1 g/dL — ABNORMAL LOW (ref 3.5–5.0)
Alkaline Phosphatase: 131 U/L (ref 40–150)
Anion Gap: 9 mEq/L (ref 3–11)
BUN: 14.5 mg/dL (ref 7.0–26.0)
CO2: 31 mEq/L — ABNORMAL HIGH (ref 22–29)
Calcium: 9.8 mg/dL (ref 8.4–10.4)
Chloride: 99 mEq/L (ref 98–109)
Creatinine: 0.8 mg/dL (ref 0.6–1.1)
EGFR: 73 mL/min/{1.73_m2} — ABNORMAL LOW (ref >90)
Glucose: 252 mg/dl — ABNORMAL HIGH (ref 70–140)
Potassium: 4 mEq/L (ref 3.5–5.1)
Sodium: 140 mEq/L (ref 136–145)
Total Bilirubin: 0.28 mg/dL (ref 0.20–1.20)
Total Protein: 7.2 g/dL (ref 6.4–8.3)

## 2014-08-08 LAB — CBC WITH DIFFERENTIAL/PLATELET
BASO%: 0.6 % (ref 0.0–2.0)
Basophils Absolute: 0 10*3/uL (ref 0.0–0.1)
EOS%: 2.3 % (ref 0.0–7.0)
Eosinophils Absolute: 0.2 10*3/uL (ref 0.0–0.5)
HCT: 43.7 % (ref 34.8–46.6)
HGB: 14.3 g/dL (ref 11.6–15.9)
LYMPH%: 33.6 % (ref 14.0–49.7)
MCH: 28.1 pg (ref 25.1–34.0)
MCHC: 32.7 g/dL (ref 31.5–36.0)
MCV: 85.9 fL (ref 79.5–101.0)
MONO#: 0.4 10*3/uL (ref 0.1–0.9)
MONO%: 5.4 % (ref 0.0–14.0)
NEUT#: 4.4 10*3/uL (ref 1.5–6.5)
NEUT%: 58.1 % (ref 38.4–76.8)
Platelets: 246 10*3/uL (ref 145–400)
RBC: 5.09 10*6/uL (ref 3.70–5.45)
RDW: 14.4 % (ref 11.2–14.5)
WBC: 7.5 10*3/uL (ref 3.9–10.3)
lymph#: 2.5 10*3/uL (ref 0.9–3.3)

## 2014-08-08 MED ORDER — LORAZEPAM 2 MG/ML IJ SOLN
INTRAMUSCULAR | Status: AC
  Filled 2014-08-08: qty 1

## 2014-08-08 MED ORDER — ACETAMINOPHEN 325 MG PO TABS
650.0000 mg | ORAL_TABLET | Freq: Once | ORAL | Status: AC
  Administered 2014-08-08: 650 mg via ORAL

## 2014-08-08 MED ORDER — SODIUM CHLORIDE 0.9 % IV SOLN
420.0000 mg | Freq: Once | INTRAVENOUS | Status: AC
  Administered 2014-08-08: 420 mg via INTRAVENOUS
  Filled 2014-08-08: qty 14

## 2014-08-08 MED ORDER — DIPHENHYDRAMINE HCL 25 MG PO CAPS
25.0000 mg | ORAL_CAPSULE | Freq: Once | ORAL | Status: AC
  Administered 2014-08-08: 25 mg via ORAL

## 2014-08-08 MED ORDER — SODIUM CHLORIDE 0.9 % IJ SOLN
10.0000 mL | INTRAMUSCULAR | Status: DC | PRN
  Administered 2014-08-08: 10 mL
  Filled 2014-08-08: qty 10

## 2014-08-08 MED ORDER — ACETAMINOPHEN 325 MG PO TABS
ORAL_TABLET | ORAL | Status: AC
  Filled 2014-08-08: qty 2

## 2014-08-08 MED ORDER — SODIUM CHLORIDE 0.9 % IV SOLN
Freq: Once | INTRAVENOUS | Status: AC
  Administered 2014-08-08: 10:00:00 via INTRAVENOUS

## 2014-08-08 MED ORDER — HEPARIN SOD (PORK) LOCK FLUSH 100 UNIT/ML IV SOLN
500.0000 [IU] | Freq: Once | INTRAVENOUS | Status: AC | PRN
  Administered 2014-08-08: 500 [IU]
  Filled 2014-08-08: qty 5

## 2014-08-08 MED ORDER — LORAZEPAM 2 MG/ML IJ SOLN
0.5000 mg | Freq: Once | INTRAMUSCULAR | Status: AC
  Administered 2014-08-08: 0.5 mg via INTRAVENOUS

## 2014-08-08 MED ORDER — DIPHENHYDRAMINE HCL 25 MG PO CAPS
ORAL_CAPSULE | ORAL | Status: AC
  Filled 2014-08-08: qty 1

## 2014-08-08 MED ORDER — TRASTUZUMAB CHEMO INJECTION 440 MG
6.0000 mg/kg | Freq: Once | INTRAVENOUS | Status: AC
  Administered 2014-08-08: 903 mg via INTRAVENOUS
  Filled 2014-08-08: qty 43

## 2014-08-08 NOTE — Patient Instructions (Signed)
[facility] Discharge Instructions for Patients Receiving Chemotherapy  Today you received the following chemotherapy agents: herceptin, perjeta  To help prevent nausea and vomiting after your treatment, we encourage you to take your nausea medication.  Take it as often as prescribed.     If you develop nausea and vomiting that is not controlled by your nausea medication, call the clinic. If it is after clinic hours your family physician or the after hours number for the clinic or go to the Emergency Department.   BELOW ARE SYMPTOMS THAT SHOULD BE REPORTED IMMEDIATELY:  *FEVER GREATER THAN 100.5 F  *CHILLS WITH OR WITHOUT FEVER  NAUSEA AND VOMITING THAT IS NOT CONTROLLED WITH YOUR NAUSEA MEDICATION  *UNUSUAL SHORTNESS OF BREATH  *UNUSUAL BRUISING OR BLEEDING  TENDERNESS IN MOUTH AND THROAT WITH OR WITHOUT PRESENCE OF ULCERS  *URINARY PROBLEMS  *BOWEL PROBLEMS  UNUSUAL RASH Items with * indicate a potential emergency and should be followed up as soon as possible.  Feel free to call the clinic you have any questions or concerns. The clinic phone number is [phone].   I have been informed and understand all the instructions given to me. I know to contact the clinic, my physician, or go to the Emergency Department if any problems should occur. I do not have any questions at this time, but understand that I may call the clinic during office hours   should I have any questions or need assistance in obtaining follow up care.    __________________________________________  _____________  __________ Signature of Patient or Authorized Representative            Date                   Time    __________________________________________ Nurse's Signature

## 2014-08-15 ENCOUNTER — Ambulatory Visit (HOSPITAL_COMMUNITY)
Admission: RE | Admit: 2014-08-15 | Discharge: 2014-08-15 | Disposition: A | Discharge status: Home / Self Care | Payer: Medicare Other | Source: Ambulatory Visit | Attending: Adult Health | Admitting: Adult Health

## 2014-08-15 DIAGNOSIS — C50212 Malignant neoplasm of upper-inner quadrant of left female breast: Secondary | ICD-10-CM | POA: Diagnosis not present

## 2014-08-15 DIAGNOSIS — C50919 Malignant neoplasm of unspecified site of unspecified female breast: Secondary | ICD-10-CM

## 2014-08-15 DIAGNOSIS — I10 Essential (primary) hypertension: Secondary | ICD-10-CM | POA: Insufficient documentation

## 2014-08-15 NOTE — Progress Notes (Signed)
Echocardiogram 2D Echocardiogram limited has been performed.  Grenda Lora 08/15/2014, 10:47 AM

## 2014-08-29 ENCOUNTER — Other Ambulatory Visit (HOSPITAL_BASED_OUTPATIENT_CLINIC_OR_DEPARTMENT_OTHER): Payer: Medicare Other

## 2014-08-29 ENCOUNTER — Telehealth: Payer: Self-pay | Admitting: Emergency Medicine

## 2014-08-29 ENCOUNTER — Ambulatory Visit (HOSPITAL_BASED_OUTPATIENT_CLINIC_OR_DEPARTMENT_OTHER): Payer: Medicare Other

## 2014-08-29 ENCOUNTER — Encounter: Payer: Self-pay | Admitting: *Deleted

## 2014-08-29 ENCOUNTER — Other Ambulatory Visit: Payer: Self-pay | Admitting: Family Medicine

## 2014-08-29 DIAGNOSIS — Z5112 Encounter for antineoplastic immunotherapy: Secondary | ICD-10-CM

## 2014-08-29 DIAGNOSIS — C50212 Malignant neoplasm of upper-inner quadrant of left female breast: Secondary | ICD-10-CM

## 2014-08-29 LAB — CBC WITH DIFFERENTIAL/PLATELET
BASO%: 0.1 % (ref 0.0–2.0)
Basophils Absolute: 0 10*3/uL (ref 0.0–0.1)
EOS%: 1.8 % (ref 0.0–7.0)
Eosinophils Absolute: 0.1 10*3/uL (ref 0.0–0.5)
HCT: 43.9 % (ref 34.8–46.6)
HGB: 14.2 g/dL (ref 11.6–15.9)
LYMPH%: 32.5 % (ref 14.0–49.7)
MCH: 28.3 pg (ref 25.1–34.0)
MCHC: 32.3 g/dL (ref 31.5–36.0)
MCV: 87.5 fL (ref 79.5–101.0)
MONO#: 0.4 10*3/uL (ref 0.1–0.9)
MONO%: 5.4 % (ref 0.0–14.0)
NEUT#: 4.1 10*3/uL (ref 1.5–6.5)
NEUT%: 60.2 % (ref 38.4–76.8)
Platelets: 250 10*3/uL (ref 145–400)
RBC: 5.02 10*6/uL (ref 3.70–5.45)
RDW: 14.3 % (ref 11.2–14.5)
WBC: 6.8 10*3/uL (ref 3.9–10.3)
lymph#: 2.2 10*3/uL (ref 0.9–3.3)

## 2014-08-29 LAB — COMPREHENSIVE METABOLIC PANEL (CC13)
ALT: 68 U/L — ABNORMAL HIGH (ref 0–55)
AST: 39 U/L — ABNORMAL HIGH (ref 5–34)
Albumin: 3.2 g/dL — ABNORMAL LOW (ref 3.5–5.0)
Alkaline Phosphatase: 129 U/L (ref 40–150)
Anion Gap: 7 mEq/L (ref 3–11)
BUN: 17.5 mg/dL (ref 7.0–26.0)
CO2: 33 mEq/L — ABNORMAL HIGH (ref 22–29)
Calcium: 9.5 mg/dL (ref 8.4–10.4)
Chloride: 100 mEq/L (ref 98–109)
Creatinine: 1 mg/dL (ref 0.6–1.1)
EGFR: 56 mL/min/{1.73_m2} — ABNORMAL LOW (ref >90)
Glucose: 329 mg/dl — ABNORMAL HIGH (ref 70–140)
Potassium: 4.5 mEq/L (ref 3.5–5.1)
Sodium: 139 mEq/L (ref 136–145)
Total Bilirubin: 0.37 mg/dL (ref 0.20–1.20)
Total Protein: 7.3 g/dL (ref 6.4–8.3)

## 2014-08-29 MED ORDER — LORAZEPAM 2 MG/ML IJ SOLN
0.5000 mg | Freq: Once | INTRAMUSCULAR | Status: AC
  Administered 2014-08-29: 0.5 mg via INTRAVENOUS

## 2014-08-29 MED ORDER — SODIUM CHLORIDE 0.9 % IV SOLN
420.0000 mg | Freq: Once | INTRAVENOUS | Status: AC
  Administered 2014-08-29: 420 mg via INTRAVENOUS
  Filled 2014-08-29: qty 14

## 2014-08-29 MED ORDER — LORAZEPAM 2 MG/ML IJ SOLN
INTRAMUSCULAR | Status: AC
  Filled 2014-08-29: qty 1

## 2014-08-29 MED ORDER — ACETAMINOPHEN 325 MG PO TABS
ORAL_TABLET | ORAL | Status: AC
  Filled 2014-08-29: qty 2

## 2014-08-29 MED ORDER — SODIUM CHLORIDE 0.9 % IJ SOLN
10.0000 mL | INTRAMUSCULAR | Status: DC | PRN
  Administered 2014-08-29: 10 mL
  Filled 2014-08-29: qty 10

## 2014-08-29 MED ORDER — ACETAMINOPHEN 325 MG PO TABS
650.0000 mg | ORAL_TABLET | Freq: Once | ORAL | Status: AC
  Administered 2014-08-29: 650 mg via ORAL

## 2014-08-29 MED ORDER — SODIUM CHLORIDE 0.9 % IV SOLN
Freq: Once | INTRAVENOUS | Status: AC
  Administered 2014-08-29: 10:00:00 via INTRAVENOUS

## 2014-08-29 MED ORDER — HEPARIN SOD (PORK) LOCK FLUSH 100 UNIT/ML IV SOLN
500.0000 [IU] | Freq: Once | INTRAVENOUS | Status: AC | PRN
  Administered 2014-08-29: 500 [IU]
  Filled 2014-08-29: qty 5

## 2014-08-29 MED ORDER — DIPHENHYDRAMINE HCL 25 MG PO CAPS
ORAL_CAPSULE | ORAL | Status: AC
  Filled 2014-08-29: qty 1

## 2014-08-29 MED ORDER — DIPHENHYDRAMINE HCL 25 MG PO CAPS
25.0000 mg | ORAL_CAPSULE | Freq: Once | ORAL | Status: AC
  Administered 2014-08-29: 25 mg via ORAL

## 2014-08-29 MED ORDER — TRASTUZUMAB CHEMO INJECTION 440 MG
6.0000 mg/kg | Freq: Once | INTRAVENOUS | Status: AC
  Administered 2014-08-29: 903 mg via INTRAVENOUS
  Filled 2014-08-29: qty 43

## 2014-08-29 NOTE — Telephone Encounter (Signed)
Patient being seen today in infusion room for Herceptin infusion.  Patient complaining of UTI symptoms to the nurse, Royce Macadamia RN, and patient is requesting an antibiotic.   Will ask Dr Jana Hakim for further direction and advise patient with those instructions.

## 2014-08-29 NOTE — Progress Notes (Signed)
Patient observed 30 mins post perjeta. Discharged with niece via wheelchair, no acute distress, VSS.

## 2014-08-29 NOTE — Progress Notes (Signed)
Patient states she weighed herself at home 3-4 weeks ago and her weight was 320.  Patient feels like she has a urinary tract infection.  She has not had much to drink and doesn't feel like she can give [place] a specimen.  She does drink cranberry juice and is taking "azostatin" which she normally takes for the frequent urinary tract infections.   She would like Dr. Jana Hakim to call her in an antibiotic to [facility].  Spoke with Ailene Ravel RN with Dr. Jana Hakim.

## 2014-08-29 NOTE — Patient Instructions (Signed)
[facility] Discharge Instructions for Patients Receiving Chemotherapy  Today you received the following chemotherapy agents: herceptin, perjeta  To help prevent nausea and vomiting after your treatment, we encourage you to take your nausea medication.  Take it as often as prescribed.     If you develop nausea and vomiting that is not controlled by your nausea medication, call the clinic. If it is after clinic hours your family physician or the after hours number for the clinic or go to the Emergency Department.   BELOW ARE SYMPTOMS THAT SHOULD BE REPORTED IMMEDIATELY:  *FEVER GREATER THAN 100.5 F  *CHILLS WITH OR WITHOUT FEVER  NAUSEA AND VOMITING THAT IS NOT CONTROLLED WITH YOUR NAUSEA MEDICATION  *UNUSUAL SHORTNESS OF BREATH  *UNUSUAL BRUISING OR BLEEDING  TENDERNESS IN MOUTH AND THROAT WITH OR WITHOUT PRESENCE OF ULCERS  *URINARY PROBLEMS  *BOWEL PROBLEMS  UNUSUAL RASH Items with * indicate a potential emergency and should be followed up as soon as possible.  Feel free to call the clinic you have any questions or concerns. The clinic phone number is [phone].   I have been informed and understand all the instructions given to me. I know to contact the clinic, my physician, or go to the Emergency Department if any problems should occur. I do not have any questions at this time, but understand that I may call the clinic during office hours   should I have any questions or need assistance in obtaining follow up care.    __________________________________________  _____________  __________ Signature of Patient or Authorized Representative            Date                   Time    __________________________________________ Nurse's Signature

## 2014-08-30 ENCOUNTER — Other Ambulatory Visit: Payer: Self-pay | Admitting: *Deleted

## 2014-08-30 MED ORDER — NITROFURANTOIN MONOHYD MACRO 100 MG PO CAPS: 100.0000 mg | ORAL_CAPSULE | Freq: Two times a day (BID) | ORAL | Status: DC

## 2014-09-11 ENCOUNTER — Telehealth: Payer: Self-pay | Admitting: *Deleted

## 2014-09-11 MED ORDER — BENZONATATE 200 MG PO CAPS: 200.0000 mg | ORAL_CAPSULE | Freq: Three times a day (TID) | ORAL | Status: DC | PRN

## 2014-09-11 NOTE — Telephone Encounter (Signed)
This RN received VM from pt stating ongoing cough and hoarseness ( evident per sound of her voice0.  Pt is requesting refill on cough medication.  This RN spoke with pt who denies any fevers.  Cough is " almost in spasms that then I heave and almost vomit "  Pt states overall cough is non productive when there is sputum it is green.  Refill given for tessalon pt has used previously.   This note will be sent to Susanne Borders NP for review and any additional concerns or recommendations.

## 2014-09-15 ENCOUNTER — Other Ambulatory Visit: Payer: Self-pay | Admitting: Family Medicine

## 2014-09-18 ENCOUNTER — Other Ambulatory Visit: Payer: Self-pay | Admitting: *Deleted

## 2014-09-18 DIAGNOSIS — C50212 Malignant neoplasm of upper-inner quadrant of left female breast: Secondary | ICD-10-CM

## 2014-09-19 ENCOUNTER — Ambulatory Visit (HOSPITAL_BASED_OUTPATIENT_CLINIC_OR_DEPARTMENT_OTHER): Payer: Medicare Other

## 2014-09-19 ENCOUNTER — Other Ambulatory Visit (HOSPITAL_BASED_OUTPATIENT_CLINIC_OR_DEPARTMENT_OTHER): Payer: Medicare Other

## 2014-09-19 DIAGNOSIS — C50212 Malignant neoplasm of upper-inner quadrant of left female breast: Secondary | ICD-10-CM

## 2014-09-19 DIAGNOSIS — Z5112 Encounter for antineoplastic immunotherapy: Secondary | ICD-10-CM

## 2014-09-19 LAB — CBC WITH DIFFERENTIAL/PLATELET
BASO%: 0.2 % (ref 0.0–2.0)
Basophils Absolute: 0 10*3/uL (ref 0.0–0.1)
EOS%: 2.2 % (ref 0.0–7.0)
Eosinophils Absolute: 0.2 10*3/uL (ref 0.0–0.5)
HCT: 44.1 % (ref 34.8–46.6)
HGB: 14.5 g/dL (ref 11.6–15.9)
LYMPH%: 28.2 % (ref 14.0–49.7)
MCH: 28.8 pg (ref 25.1–34.0)
MCHC: 32.9 g/dL (ref 31.5–36.0)
MCV: 87.7 fL (ref 79.5–101.0)
MONO#: 0.5 10*3/uL (ref 0.1–0.9)
MONO%: 5.3 % (ref 0.0–14.0)
NEUT#: 5.4 10*3/uL (ref 1.5–6.5)
NEUT%: 64.1 % (ref 38.4–76.8)
Platelets: 278 10*3/uL (ref 145–400)
RBC: 5.03 10*6/uL (ref 3.70–5.45)
RDW: 14.1 % (ref 11.2–14.5)
WBC: 8.5 10*3/uL (ref 3.9–10.3)
lymph#: 2.4 10*3/uL (ref 0.9–3.3)

## 2014-09-19 LAB — COMPREHENSIVE METABOLIC PANEL (CC13)
ALT: 46 U/L (ref 0–55)
AST: 30 U/L (ref 5–34)
Albumin: 3 g/dL — ABNORMAL LOW (ref 3.5–5.0)
Alkaline Phosphatase: 144 U/L (ref 40–150)
Anion Gap: 10 mEq/L (ref 3–11)
BUN: 11.6 mg/dL (ref 7.0–26.0)
CO2: 33 mEq/L — ABNORMAL HIGH (ref 22–29)
Calcium: 9.4 mg/dL (ref 8.4–10.4)
Chloride: 96 mEq/L — ABNORMAL LOW (ref 98–109)
Creatinine: 0.9 mg/dL (ref 0.6–1.1)
EGFR: 70 mL/min/{1.73_m2} — ABNORMAL LOW (ref >90)
Glucose: 287 mg/dl — ABNORMAL HIGH (ref 70–140)
Potassium: 4.3 mEq/L (ref 3.5–5.1)
Sodium: 139 mEq/L (ref 136–145)
Total Bilirubin: 0.29 mg/dL (ref 0.20–1.20)
Total Protein: 7.4 g/dL (ref 6.4–8.3)

## 2014-09-19 MED ORDER — LORAZEPAM 2 MG/ML IJ SOLN
0.5000 mg | Freq: Once | INTRAMUSCULAR | Status: AC
  Administered 2014-09-19: 0.5 mg via INTRAVENOUS

## 2014-09-19 MED ORDER — HEPARIN SOD (PORK) LOCK FLUSH 100 UNIT/ML IV SOLN
500.0000 [IU] | Freq: Once | INTRAVENOUS | Status: AC | PRN
  Administered 2014-09-19: 500 [IU]
  Filled 2014-09-19: qty 5

## 2014-09-19 MED ORDER — ACETAMINOPHEN 325 MG PO TABS
ORAL_TABLET | ORAL | Status: AC
  Filled 2014-09-19: qty 2

## 2014-09-19 MED ORDER — LORAZEPAM 2 MG/ML IJ SOLN
INTRAMUSCULAR | Status: AC
  Filled 2014-09-19: qty 1

## 2014-09-19 MED ORDER — SODIUM CHLORIDE 0.9 % IJ SOLN
10.0000 mL | INTRAMUSCULAR | Status: DC | PRN
  Administered 2014-09-19: 10 mL
  Filled 2014-09-19: qty 10

## 2014-09-19 MED ORDER — SODIUM CHLORIDE 0.9 % IV SOLN
420.0000 mg | Freq: Once | INTRAVENOUS | Status: AC
  Administered 2014-09-19: 420 mg via INTRAVENOUS
  Filled 2014-09-19: qty 14

## 2014-09-19 MED ORDER — DIPHENHYDRAMINE HCL 25 MG PO CAPS
ORAL_CAPSULE | ORAL | Status: AC
  Filled 2014-09-19: qty 1

## 2014-09-19 MED ORDER — SODIUM CHLORIDE 0.9 % IV SOLN
Freq: Once | INTRAVENOUS | Status: AC
  Administered 2014-09-19: 10:00:00 via INTRAVENOUS

## 2014-09-19 MED ORDER — TRASTUZUMAB CHEMO INJECTION 440 MG
6.0000 mg/kg | Freq: Once | INTRAVENOUS | Status: AC
  Administered 2014-09-19: 903 mg via INTRAVENOUS
  Filled 2014-09-19: qty 43

## 2014-09-19 MED ORDER — DIPHENHYDRAMINE HCL 25 MG PO CAPS
25.0000 mg | ORAL_CAPSULE | Freq: Once | ORAL | Status: AC
  Administered 2014-09-19: 25 mg via ORAL

## 2014-09-19 MED ORDER — ACETAMINOPHEN 325 MG PO TABS
650.0000 mg | ORAL_TABLET | Freq: Once | ORAL | Status: AC
  Administered 2014-09-19: 650 mg via ORAL

## 2014-09-19 NOTE — Patient Instructions (Signed)
[facility] Discharge Instructions for Patients Receiving Chemotherapy  Today you received the following chemotherapy agents: herceptin, perjeta  To help prevent nausea and vomiting after your treatment, we encourage you to take your nausea medication.  Take it as often as prescribed.     If you develop nausea and vomiting that is not controlled by your nausea medication, call the clinic. If it is after clinic hours your family physician or the after hours number for the clinic or go to the Emergency Department.   BELOW ARE SYMPTOMS THAT SHOULD BE REPORTED IMMEDIATELY:  *FEVER GREATER THAN 100.5 F  *CHILLS WITH OR WITHOUT FEVER  NAUSEA AND VOMITING THAT IS NOT CONTROLLED WITH YOUR NAUSEA MEDICATION  *UNUSUAL SHORTNESS OF BREATH  *UNUSUAL BRUISING OR BLEEDING  TENDERNESS IN MOUTH AND THROAT WITH OR WITHOUT PRESENCE OF ULCERS  *URINARY PROBLEMS  *BOWEL PROBLEMS  UNUSUAL RASH Items with * indicate a potential emergency and should be followed up as soon as possible.  Feel free to call the clinic you have any questions or concerns. The clinic phone number is [phone].   I have been informed and understand all the instructions given to me. I know to contact the clinic, my physician, or go to the Emergency Department if any problems should occur. I do not have any questions at this time, but understand that I may call the clinic during office hours   should I have any questions or need assistance in obtaining follow up care.    __________________________________________  _____________  __________ Signature of Patient or Authorized Representative            Date                   Time    __________________________________________ Nurse's Signature

## 2014-09-27 ENCOUNTER — Telehealth: Payer: Self-pay | Admitting: *Deleted

## 2014-09-27 ENCOUNTER — Other Ambulatory Visit: Payer: Self-pay | Admitting: *Deleted

## 2014-09-27 DIAGNOSIS — C7802 Secondary malignant neoplasm of left lung: Principal | ICD-10-CM

## 2014-09-27 DIAGNOSIS — C50912 Malignant neoplasm of unspecified site of left female breast: Secondary | ICD-10-CM

## 2014-09-27 MED ORDER — BENZONATATE 200 MG PO CAPS: 200.0000 mg | ORAL_CAPSULE | Freq: Three times a day (TID) | ORAL | Status: DC | PRN

## 2014-09-27 MED ORDER — HYDROCODONE-HOMATROPINE 5-1.5 MG/5ML PO SYRP: 5.0000 mL | ORAL_SOLUTION | Freq: Four times a day (QID) | ORAL | Status: DC | PRN

## 2014-09-27 NOTE — Telephone Encounter (Signed)
Called patient to pick up prescription for Hycodan syrup.  Transportation will need to be arranged or someone to pick up the prescription for her.  Aware prescription is ready for pick up Monday through Friday 8:15 - 4:15 pm.  Again reviewed symptoms to call for or report to ER.

## 2014-09-27 NOTE — Telephone Encounter (Signed)
Worsening of bronchitis, with coughing.  Taking Robitussin, doesn't like the way it make her feel.  Reports she needs and antibiotic and cough medication.

## 2014-09-27 NOTE — Telephone Encounter (Signed)
Message received from patient with c/o worsening cough, request antibiotic or cough medication.  (See 09-11-2014 phone note addendum)  Verbal order receicvd and read back from Drue Second NP for 2-view CXR and visit.   Called patient who says "no, I do not have a ride today.  This is the third week of this bronchitis asthma.  I ws on an antibiotic for a urinary track infection so they did not give me another antibiotic.  I've had this lots of times and it will clear up in a few weeks."   Denies fever and denies having anymore Hycodan and tessalon.  Admits to using [employer] as ordered.  "Phlegm is yellow or clear.  It was green but not any more.  When I ran out of the tessalon my cough returned.  I cough so much I either throw up food or more phlegm."  Asked if she can have transportation for tomorrow because she needs a cxr.  "I don't need a cxr.  I think I'll be okay.  I've been through this before."   Informed her to keep checking to make sure no fever.  To call if fever, coughing worsens, phlegm color changes or blood with cough, chest pain or trouble breathing and to report to ER for distress.  Verbal order received and read back from [employer] NT to refill Hycodan and Tessalon.  Called patient who will have her niece pick these up after work.

## 2014-09-28 ENCOUNTER — Ambulatory Visit (HOSPITAL_COMMUNITY): Admission: RE | Admit: 2014-09-28 | Payer: Medicare Other | Source: Ambulatory Visit

## 2014-09-28 ENCOUNTER — Inpatient Hospital Stay (HOSPITAL_COMMUNITY)
Admission: EM | Admit: 2014-09-28 | Discharge: 2014-10-01 | DRG: 193 | Disposition: A | Discharge status: Home Health | Payer: Medicare Other | Attending: Internal Medicine | Admitting: Internal Medicine

## 2014-09-28 ENCOUNTER — Emergency Department (HOSPITAL_COMMUNITY): Payer: Medicare Other

## 2014-09-28 ENCOUNTER — Encounter (HOSPITAL_COMMUNITY): Payer: Self-pay | Admitting: Emergency Medicine

## 2014-09-28 DIAGNOSIS — Z881 Allergy status to other antibiotic agents status: Secondary | ICD-10-CM

## 2014-09-28 DIAGNOSIS — E1165 Type 2 diabetes mellitus with hyperglycemia: Secondary | ICD-10-CM

## 2014-09-28 DIAGNOSIS — Z8249 Family history of ischemic heart disease and other diseases of the circulatory system: Secondary | ICD-10-CM | POA: Diagnosis not present

## 2014-09-28 DIAGNOSIS — Z87891 Personal history of nicotine dependence: Secondary | ICD-10-CM

## 2014-09-28 DIAGNOSIS — C50219 Malignant neoplasm of upper-inner quadrant of unspecified female breast: Secondary | ICD-10-CM | POA: Diagnosis present

## 2014-09-28 DIAGNOSIS — Z880 Allergy status to penicillin: Secondary | ICD-10-CM

## 2014-09-28 DIAGNOSIS — R0602 Shortness of breath: Secondary | ICD-10-CM | POA: Diagnosis present

## 2014-09-28 DIAGNOSIS — M1990 Unspecified osteoarthritis, unspecified site: Secondary | ICD-10-CM | POA: Diagnosis present

## 2014-09-28 DIAGNOSIS — E119 Type 2 diabetes mellitus without complications: Secondary | ICD-10-CM

## 2014-09-28 DIAGNOSIS — R918 Other nonspecific abnormal finding of lung field: Secondary | ICD-10-CM | POA: Diagnosis present

## 2014-09-28 DIAGNOSIS — Z888 Allergy status to other drugs, medicaments and biological substances status: Secondary | ICD-10-CM | POA: Diagnosis not present

## 2014-09-28 DIAGNOSIS — Z886 Allergy status to analgesic agent status: Secondary | ICD-10-CM | POA: Diagnosis not present

## 2014-09-28 DIAGNOSIS — Z170 Estrogen receptor positive status [ER+]: Secondary | ICD-10-CM | POA: Diagnosis present

## 2014-09-28 DIAGNOSIS — G4730 Sleep apnea, unspecified: Secondary | ICD-10-CM | POA: Diagnosis present

## 2014-09-28 DIAGNOSIS — Z803 Family history of malignant neoplasm of breast: Secondary | ICD-10-CM

## 2014-09-28 DIAGNOSIS — C7800 Secondary malignant neoplasm of unspecified lung: Secondary | ICD-10-CM | POA: Diagnosis present

## 2014-09-28 DIAGNOSIS — C7801 Secondary malignant neoplasm of right lung: Secondary | ICD-10-CM

## 2014-09-28 DIAGNOSIS — C50911 Malignant neoplasm of unspecified site of right female breast: Secondary | ICD-10-CM

## 2014-09-28 DIAGNOSIS — J9602 Acute respiratory failure with hypercapnia: Secondary | ICD-10-CM | POA: Diagnosis present

## 2014-09-28 DIAGNOSIS — Z79891 Long term (current) use of opiate analgesic: Secondary | ICD-10-CM | POA: Diagnosis not present

## 2014-09-28 DIAGNOSIS — I10 Essential (primary) hypertension: Secondary | ICD-10-CM | POA: Diagnosis present

## 2014-09-28 DIAGNOSIS — J9811 Atelectasis: Secondary | ICD-10-CM | POA: Diagnosis present

## 2014-09-28 DIAGNOSIS — J9601 Acute respiratory failure with hypoxia: Secondary | ICD-10-CM

## 2014-09-28 DIAGNOSIS — Z86718 Personal history of other venous thrombosis and embolism: Secondary | ICD-10-CM

## 2014-09-28 DIAGNOSIS — I82629 Acute embolism and thrombosis of deep veins of unspecified upper extremity: Secondary | ICD-10-CM | POA: Diagnosis present

## 2014-09-28 DIAGNOSIS — Z79899 Other long term (current) drug therapy: Secondary | ICD-10-CM | POA: Diagnosis not present

## 2014-09-28 DIAGNOSIS — J189 Pneumonia, unspecified organism: Secondary | ICD-10-CM | POA: Diagnosis present

## 2014-09-28 DIAGNOSIS — R0902 Hypoxemia: Secondary | ICD-10-CM

## 2014-09-28 DIAGNOSIS — K760 Fatty (change of) liver, not elsewhere classified: Secondary | ICD-10-CM | POA: Diagnosis present

## 2014-09-28 DIAGNOSIS — Z6841 Body Mass Index (BMI) 40.0 and over, adult: Secondary | ICD-10-CM | POA: Diagnosis not present

## 2014-09-28 DIAGNOSIS — Z7982 Long term (current) use of aspirin: Secondary | ICD-10-CM | POA: Diagnosis not present

## 2014-09-28 DIAGNOSIS — C50919 Malignant neoplasm of unspecified site of unspecified female breast: Secondary | ICD-10-CM | POA: Diagnosis present

## 2014-09-28 DIAGNOSIS — Z833 Family history of diabetes mellitus: Secondary | ICD-10-CM | POA: Diagnosis not present

## 2014-09-28 DIAGNOSIS — C50212 Malignant neoplasm of upper-inner quadrant of left female breast: Secondary | ICD-10-CM | POA: Diagnosis present

## 2014-09-28 DIAGNOSIS — Z885 Allergy status to narcotic agent status: Secondary | ICD-10-CM

## 2014-09-28 DIAGNOSIS — E1149 Type 2 diabetes mellitus with other diabetic neurological complication: Secondary | ICD-10-CM

## 2014-09-28 DIAGNOSIS — J45901 Unspecified asthma with (acute) exacerbation: Secondary | ICD-10-CM | POA: Diagnosis present

## 2014-09-28 HISTORY — DX: Type 2 diabetes mellitus without complications: E11.9

## 2014-09-28 LAB — CBC
HCT: 45.6 % (ref 36.0–46.0)
Hemoglobin: 14.8 g/dL (ref 12.0–15.0)
MCH: 29 pg (ref 26.0–34.0)
MCHC: 32.5 g/dL (ref 30.0–36.0)
MCV: 89.4 fL (ref 78.0–100.0)
Platelets: 315 10*3/uL (ref 150–400)
RBC: 5.1 MIL/uL (ref 3.87–5.11)
RDW: 14.2 % (ref 11.5–15.5)
WBC: 6.8 10*3/uL (ref 4.0–10.5)

## 2014-09-28 LAB — BASIC METABOLIC PANEL
Anion gap: 8 (ref 5–15)
BUN: 13 mg/dL (ref 6–23)
CO2: 34 mmol/L — ABNORMAL HIGH (ref 19–32)
Calcium: 9 mg/dL (ref 8.4–10.5)
Chloride: 91 mmol/L — ABNORMAL LOW (ref 96–112)
Creatinine, Ser: 0.79 mg/dL (ref 0.50–1.10)
GFR calc Af Amer: >90 mL/min (ref >90)
GFR calc non Af Amer: 86 mL/min — ABNORMAL LOW (ref >90)
Glucose, Bld: 248 mg/dL — ABNORMAL HIGH (ref 70–99)
Potassium: 3.9 mmol/L (ref 3.5–5.1)
Sodium: 133 mmol/L — ABNORMAL LOW (ref 135–145)

## 2014-09-28 LAB — DIFFERENTIAL
Basophils Absolute: 0 10*3/uL (ref 0.0–0.1)
Basophils Relative: 0 % (ref 0–1)
Eosinophils Absolute: 0 10*3/uL (ref 0.0–0.7)
Eosinophils Relative: 0 % (ref 0–5)
Lymphocytes Relative: 19 % (ref 12–46)
Lymphs Abs: 1.3 10*3/uL (ref 0.7–4.0)
Monocytes Absolute: 0.7 10*3/uL (ref 0.1–1.0)
Monocytes Relative: 11 % (ref 3–12)
Neutro Abs: 4.7 10*3/uL (ref 1.7–7.7)
Neutrophils Relative %: 70 % (ref 43–77)

## 2014-09-28 LAB — BLOOD GAS, ARTERIAL
Acid-Base Excess: 6.1 mmol/L — ABNORMAL HIGH (ref 0.0–2.0)
Bicarbonate: 35.4 mEq/L — ABNORMAL HIGH (ref 20.0–24.0)
Drawn by: 232811
FIO2: 1 %
O2 Saturation: 98.9 %
Patient temperature: 98.6
TCO2: 31.6 mmol/L (ref 0–100)
pCO2 arterial: 75.6 mmHg (ref 35.0–45.0)
pH, Arterial: 7.292 — ABNORMAL LOW (ref 7.350–7.450)
pO2, Arterial: 223 mmHg — ABNORMAL HIGH (ref 80.0–100.0)

## 2014-09-28 LAB — I-STAT TROPONIN, ED: Troponin i, poc: 0 ng/mL (ref 0.00–0.08)

## 2014-09-28 LAB — BRAIN NATRIURETIC PEPTIDE: B Natriuretic Peptide: 34.9 pg/mL (ref 0.0–100.0)

## 2014-09-28 MED ORDER — IPRATROPIUM-ALBUTEROL 0.5-2.5 (3) MG/3ML IN SOLN
3.0000 mL | Freq: Once | RESPIRATORY_TRACT | Status: AC
  Administered 2014-09-28: 3 mL via RESPIRATORY_TRACT
  Filled 2014-09-28: qty 3

## 2014-09-28 MED ORDER — METHYLPREDNISOLONE SODIUM SUCC 125 MG IJ SOLR
125.0000 mg | Freq: Once | INTRAMUSCULAR | Status: AC
Start: 2014-09-28 — End: 2014-09-28
  Administered 2014-09-28: 125 mg via INTRAVENOUS
  Filled 2014-09-28: qty 2

## 2014-09-28 MED ORDER — ALBUTEROL (5 MG/ML) CONTINUOUS INHALATION SOLN
10.0000 mg/h | INHALATION_SOLUTION | Freq: Once | RESPIRATORY_TRACT | Status: AC
  Administered 2014-09-28: 10 mg/h via RESPIRATORY_TRACT
  Filled 2014-09-28: qty 20

## 2014-09-28 MED ORDER — LEVOFLOXACIN IN D5W 750 MG/150ML IV SOLN
750.0000 mg | Freq: Once | INTRAVENOUS | Status: AC
  Administered 2014-09-28: 750 mg via INTRAVENOUS
  Filled 2014-09-28: qty 150

## 2014-09-28 NOTE — Progress Notes (Signed)
Peak flow was not done.  Pt requiring bipap.

## 2014-09-28 NOTE — ED Provider Notes (Signed)
CSN: [id number]     Arrival date & time 09/28/14  1856 History   First MD Initiated Contact with Patient 09/28/14 1906     Chief Complaint  Patient presents with  . Shortness of Breath     (Consider location/radiation/quality/duration/timing/severity/associated sxs/prior Treatment) HPI  64 year old female with a history of breast cancer and lung cancer presents with cough for the past 3 weeks. Over last couple days she's become progressively more short of breath and had chest pain on the left side. She states she is coughing multiple times and severely and ended up passing out while coughing. Has felt a headache and dizziness over the last couple days as well. No focal weakness but states she has been generically week. Has not noted any leg swelling. Patient has had chills without fever. Has taken her albuterol without any relief.  Past Medical History  Diagnosis Date  . Hypertension   . Other abnormal glucose   . Obesity, unspecified   . Unspecified sleep apnea   . Syncope and collapse   . Chest pain   . Suicide attempt 1996  . Fatty liver 6/03  . Lung disease   . Arthritis   . Back pain   . Breast cancer dx'd 11/2012    left   Past Surgical History  Procedure Laterality Date  . Cardiac catheterization      2007  . Cholecystectomy    . Tubal ligation     Family History  Problem Relation Age of Onset  . Coronary artery disease Father 10  . Diabetes Father   . Heart disease Father   . Breast cancer Mother 67  . Cancer Mother 33    breast  . Coronary artery disease Sister 58  . Coronary artery disease Brother 70  . Cancer Maternal Aunt 40    ovarian  . Cancer Maternal Grandmother 55    ovarian  . Cancer Paternal Aunt 66    ovarian/breast/breast   History  Substance Use Topics  . Smoking status: Former Smoker    Quit date: 01/01/1992  . Smokeless tobacco: Never Used  . Alcohol Use: No   OB History    No data available     Review of Systems  Constitutional:  Positive for chills. Negative for fever.  Respiratory: Positive for cough, shortness of breath and wheezing.   Cardiovascular: Positive for chest pain. Negative for leg swelling.  Gastrointestinal: Negative for vomiting.  Neurological: Positive for dizziness and headaches.  All other systems reviewed and are negative.     Allergies  Meperidine hcl; Penicillins; Amoxicillin; Aspirin; and Percocet  Home Medications   Prior to Admission medications   Medication Sig Start Date End Date Taking? Authorizing Provider  amLODipine (NORVASC) 5 MG tablet TAKE 1 TABLET (5 MG TOTAL) BY MOUTH DAILY. 02/10/14   [name], MD  aspirin 81 MG EC tablet TAKE 1 TABLET BY MOUTH DAILY 07/26/14   [name], MD  benzonatate (TESSALON) 200 MG capsule Take 1 capsule (200 mg total) by mouth 3 (three) times daily as needed for cough. 09/27/14   Drue Second, NP  carvedilol (COREG) 12.5 MG tablet TAKE 1 TABLET (12.5 MG TOTAL) BY MOUTH 2 (TWO) TIMES DAILY WITH A MEAL. 11/08/13   Jolaine Artist, MD  HYDROcodone-homatropine [facility]) 5-1.5 MG/5ML syrup Take 5 mLs by mouth every 6 (six) hours as needed for cough. 09/27/14   Drue Second, NP  letrozole [facility]) 2.5 MG tablet Take 1 tablet (2.5 mg total) by mouth  daily. 01/05/14   Chauncey Cruel, MD  lidocaine-prilocaine (EMLA) cream APPLY TOPICALLY AS NEEDED. 05/08/13   Chauncey Cruel, MD  LORazepam (ATIVAN) 0.5 MG tablet TAKE 1/2 TO 1 TABLET BY MOUTH TWICE A DAY AS NEEDED FOR ANXIETY/INSOMNIA 11/07/13   Chauncey Cruel, MD  losartan-hydrochlorothiazide (HYZAAR) 100-12.5 MG per tablet TAKE 1 TABLET BY MOUTH DAILY. 08/29/14   [name], MD  metoCLOPramide (REGLAN) 10 MG tablet Take 1 tablet (10 mg total) by mouth every 6 (six) hours as needed for nausea. 01/31/14   Chauncey Cruel, MD  nitrofurantoin, macrocrystal-monohydrate, (MACROBID) 100 MG capsule Take 1 capsule (100 mg total) by mouth 2 (two) times daily. 08/30/14   Chauncey Cruel,  MD  ondansetron (ZOFRAN) 8 MG tablet Take 1 tablet (8 mg total) by mouth every 12 (twelve) hours as needed for nausea or vomiting. 06/05/14   Chauncey Cruel, MD  PROAIR HFA 108 (90 BASE) MCG/ACT inhaler INHALE 2 PUFFS INTO THE LUNGS EVERY 6 (SIX) HOURS AS NEEDED FOR WHEEZING. 09/19/14   [name], MD   BP 139/69 mmHg  Pulse 91  Resp 16  SpO2 96% Physical Exam  Constitutional: She is oriented to person, place, and time. She appears well-developed and well-nourished.  Morbidly obese  HENT:  Head: Normocephalic and atraumatic.  Right Ear: External ear normal.  Left Ear: External ear normal.  Nose: Nose normal.  Eyes: Right eye exhibits no discharge. Left eye exhibits no discharge.  Cardiovascular: Normal rate, regular rhythm and normal heart sounds.   Pulmonary/Chest: Effort normal. No accessory muscle usage. Tachypnea noted. She has decreased breath sounds. She has wheezes.  Abdominal: Soft. There is no tenderness.  Musculoskeletal: She exhibits no edema.  Overall large lower extremities bilaterally but no obvious edema  Neurological: She is alert and oriented to person, place, and time.  Skin: Skin is warm and dry.  Nursing note and vitals reviewed.   ED Course  Procedures (including critical care time) Labs Review Labs Reviewed  BASIC METABOLIC PANEL - Abnormal; Notable for the following:    Sodium 133 (*)    Chloride 91 (*)    CO2 34 (*)    Glucose, Bld 248 (*)    GFR calc non Af Amer 86 (*)    All other components within normal limits  BLOOD GAS, ARTERIAL - Abnormal; Notable for the following:    pH, Arterial 7.292 (*)    pCO2 arterial 75.6 (*)    pO2, Arterial 223.0 (*)    Bicarbonate 35.4 (*)    Acid-Base Excess 6.1 (*)    All other components within normal limits  CBC  BRAIN NATRIURETIC PEPTIDE  DIFFERENTIAL  D-DIMER, QUANTITATIVE  Randolm Idol, ED    Imaging Review Dg Chest Port 1 View  09/28/2014   CLINICAL DATA:  Shortness of breath, chest  pain, history of breast cancer  EXAM: PORTABLE CHEST - 1 VIEW  COMPARISON:  CT chest dated 06/20/2014  FINDINGS: Low lung volumes with vascular crowding. Left basilar atelectasis. No pleural effusion or pneumothorax.  The heart is normal in size.  Right chest power port terminates at the cavoatrial junction.  IMPRESSION: Low lung volumes with left basilar atelectasis.   Electronically Signed   By: Julian Hy M.D.   On: 09/28/2014 20:56     EKG Interpretation None      MDM   Final diagnoses:  Acute respiratory failure with hypercapnia  Hypoxia    Patient's work of breathing dramatically  improved with oxygen by nonrebreather. She has diffusely diminished lung sounds with mild wheezes, lung sounds gradually improved with multiple albuterol treatments. Given her hypercarbic respiratory acidosis she was started on BiPAP with good improvement in her work of breathing. She feels much better but will still require close monitoring and admission to the stepdown unit. Her initial hypoxia I believe was due to the severe respiratory disease, pulmonary embolism is in the differential but given her response to BiPAP and albuterol and feel COPD/asthma exacerbation is much more likely.    Ephraim Hamburger, MD 09/28/14 [phone]

## 2014-09-28 NOTE — H&P (Addendum)
[facility] History and Physical  [name] XWR:[id number] DOB: 06-22-1951 DOA: 09/28/2014  Referring physician: ED physician PCP: [name], MD  Specialists:   Chief Complaint: Dry cough, mild chest pain and shortness of breath  HPI: [name] is a 64 y.o. 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  Patient reports that she has been having dry cough, shortness of breath and mild chest pain in the past 3 weeks. Her symptoms have been progressively getting worse. In the past a few days, her cough is getting more severe, in several times she almost vomited because of severe coughing. Patient does not have fever, but has chills. She reports that she had bilateral calf pain 3 weeks ago, which has resolved spontaneously.   Patient denies abdominal pain, diarrhea, dysuria, urgency, frequency, hematuria, skin rashes, joint pain or leg swelling. No unilateral weakness, numbness or tingling sensations. No vision change or hearing loss.  In ED, patient was found to have negative troponin, BNP 34, no leukocytosis, electrolytes okay. Chest x-ray showed vascular crowding. ABG showed a pH 7.292, PCO2 75.6, PaO2 223. Patient is admitted to inpatient for further evaluation treatment.  Review of Systems: As presented in the history of presenting illness, rest negative.  Where does patient live?  At home Can patient participate in ADLs? little  Allergy:  Allergies  Allergen Reactions  . Meperidine Hcl Anaphylaxis  . Penicillins Anaphylaxis  . Amoxicillin     REACTION: unspecified  . Aspirin Nausea And Vomiting    REACTION: unspecified  . Percocet [Oxycodone-Acetaminophen]     Past Medical History  Diagnosis Date  . Hypertension   . Other abnormal glucose   . Obesity, unspecified   .  Unspecified sleep apnea   . Syncope and collapse   . Chest pain   . Suicide attempt 1996  . Fatty liver 6/03  . Lung disease   . Arthritis   . Back pain   . Breast cancer dx'd 11/2012    left    Past Surgical History  Procedure Laterality Date  . Cardiac catheterization      2007  . Cholecystectomy    . Tubal ligation      Social History:  reports that she quit smoking about 22 years ago. She has never used smokeless tobacco. She reports that she does not drink alcohol or use illicit drugs.  Family History:  Family History  Problem Relation Age of Onset  . Coronary artery disease Father 18  . Diabetes Father   . Heart disease Father   . Breast cancer Mother 3  . Cancer Mother 59    breast  . Coronary artery disease Sister 67  . Coronary artery disease Brother 63  . Cancer Maternal Aunt 40    ovarian  . Cancer Maternal Grandmother 55    ovarian  . Cancer Paternal Aunt 28    ovarian/breast/breast     Prior to Admission medications   Medication Sig Start Date End Date Taking? Authorizing Provider  amLODipine (NORVASC) 5 MG tablet TAKE 1 TABLET (5 MG TOTAL) BY MOUTH DAILY. 02/10/14   [name], MD  aspirin 81 MG EC tablet TAKE 1 TABLET BY MOUTH DAILY 07/26/14   [name], MD  benzonatate (TESSALON) 200 MG capsule Take 1 capsule (200 mg total) by mouth 3 (three) times  daily as needed for cough. 09/27/14   Drue Second, NP  carvedilol (COREG) 12.5 MG tablet TAKE 1 TABLET (12.5 MG TOTAL) BY MOUTH 2 (TWO) TIMES DAILY WITH A MEAL. 11/08/13   Jolaine Artist, MD  HYDROcodone-homatropine [facility]) 5-1.5 MG/5ML syrup Take 5 mLs by mouth every 6 (six) hours as needed for cough. 09/27/14   Drue Second, NP  letrozole [facility]) 2.5 MG tablet Take 1 tablet (2.5 mg total) by mouth daily. 01/05/14   Chauncey Cruel, MD  lidocaine-prilocaine (EMLA) cream APPLY TOPICALLY AS NEEDED. 05/08/13   Chauncey Cruel, MD  LORazepam (ATIVAN) 0.5 MG tablet TAKE 1/2 TO 1 TABLET BY  MOUTH TWICE A DAY AS NEEDED FOR ANXIETY/INSOMNIA 11/07/13   Chauncey Cruel, MD  losartan-hydrochlorothiazide (HYZAAR) 100-12.5 MG per tablet TAKE 1 TABLET BY MOUTH DAILY. 08/29/14   [name], MD  metoCLOPramide (REGLAN) 10 MG tablet Take 1 tablet (10 mg total) by mouth every 6 (six) hours as needed for nausea. 01/31/14   Chauncey Cruel, MD  nitrofurantoin, macrocrystal-monohydrate, (MACROBID) 100 MG capsule Take 1 capsule (100 mg total) by mouth 2 (two) times daily. 08/30/14   Chauncey Cruel, MD  ondansetron (ZOFRAN) 8 MG tablet Take 1 tablet (8 mg total) by mouth every 12 (twelve) hours as needed for nausea or vomiting. 06/05/14   Chauncey Cruel, MD  PROAIR HFA 108 [phone] BASE) MCG/ACT inhaler INHALE 2 PUFFS INTO THE LUNGS EVERY 6 (SIX) HOURS AS NEEDED FOR WHEEZING. 09/19/14   [name], MD    Physical Exam: Filed Vitals:   09/28/14 1956 09/28/14 2043 09/28/14 2045 09/28/14 2217  BP:    141/64  Pulse:  80  78  Temp:    98.9 F (37.2 C)  TempSrc:    Oral  Resp:  21  20  SpO2: 98% 96% 95% 94%   General: Not in acute distress HEENT:       Eyes: PERRL, EOMI, no scleral icterus       ENT: No discharge from the ears and nose, no pharynx injection, no tonsillar enlargement.        Neck: No JVD, no bruit, no mass felt. Cardiac: S1/S2, RRR, No murmurs, No gallops or rubs Pulm: mild wheezing bilaterally, no rales or rubs. Abd: Soft, nondistended, nontender, no rebound pain, no organomegaly, BS present Ext: No edema bilaterally. 2+DP/PT pulse bilaterally Musculoskeletal: No joint deformities, erythema, or stiffness, ROM full Skin: No rashes.  Neuro: Alert and oriented X3, cranial nerves II-XII grossly intact, muscle strength 5/5 in all extremeties, sensation to light touch intact.  Psych: Patient is not psychotic, no suicidal or hemocidal ideation.  Labs on Admission:  Basic Metabolic Panel:  Recent Labs Lab 09/28/14 1918  NA 133*  K 3.9  CL 91*  CO2 34*   GLUCOSE 248*  BUN 13  CREATININE 0.79  CALCIUM 9.0   Liver Function Tests: No results for input(s): AST, ALT, ALKPHOS, BILITOT, PROT, ALBUMIN in the last 168 hours. No results for input(s): LIPASE, AMYLASE in the last 168 hours. No results for input(s): AMMONIA in the last 168 hours. CBC:  Recent Labs Lab 09/28/14 1918  WBC 6.8  NEUTROABS 4.7  HGB 14.8  HCT 45.6  MCV 89.4  PLT 315   Cardiac Enzymes: No results for input(s): CKTOTAL, CKMB, CKMBINDEX, TROPONINI in the last 168 hours.  BNP (last 3 results)  Recent Labs  09/28/14 1920  BNP 34.9    ProBNP (last 3 results) No results  for input(s): PROBNP in the last 8760 hours.  CBG: No results for input(s): GLUCAP in the last 168 hours.  Radiological Exams on Admission: Dg Chest Port 1 View  09/28/2014   CLINICAL DATA:  Shortness of breath, chest pain, history of breast cancer  EXAM: PORTABLE CHEST - 1 VIEW  COMPARISON:  CT chest dated 06/20/2014  FINDINGS: Low lung volumes with vascular crowding. Left basilar atelectasis. No pleural effusion or pneumothorax.  The heart is normal in size.  Right chest power port terminates at the cavoatrial junction.  IMPRESSION: Low lung volumes with left basilar atelectasis.   Electronically Signed   By: Julian Hy M.D.   On: 09/28/2014 20:56    EKG: Independently reviewed.   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 Patient reports that several years ago she had a history of asthma, which was treated with breathing treatment. Currently she is not taking any  medications for asthma at home. She has mild wheezing on auscultation, which is consistent with asthma exacerbation. Another potential differential diagnosis is pulmonary embolism, given that patient is at high risk of getting clot due to history of left arm DVT and current metastasized breast cancer.   -will admit to sdu - hold abx - Albuterol Nebulizer prn for SOB, scheduled DuoNeb nebulizer, Solu-Medrol 80 mg every 8 hours -Mucinex for cough. Patient is also on hydrocodone-acetaminophen - Urine legionella and S. pneumococcal antigen and respiratory virus panel - Follow up blood culture x2, sputum culture - Check d-dimer stat. 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 Vinie Sill. Last seen was on last Monday. Patient is getting chemotherapy every 3 weeks. She is also taking letrozole currently. -Follow up with her oncologist -continue letrozole  Hypertension: -Continue home medications: Coreg, Hyzaar  Hx of Left arm DVT: Complicated anticoagulation treatment 2 years ago. No new issue   DVT ppx: SQ Heparin     Code Status: Full code Family Communication:  Yes, patient's daughter    at bed side Disposition Plan: Admit to inpatient   Date of Service 09/28/2014    Ivor Costa [facility] Pager [phone]  If 7PM-7AM, please contact night-coverage [email or web address] Password [facility] 09/28/2014, 10:25 PM

## 2014-09-28 NOTE — Progress Notes (Addendum)
Arrived at pt bedside, pt demanding bipap mask be taken off.  Pt stated, " I can't stand it."  Pt also stated that she won't be able to sleep with it.  Mask removed per pt request and placed on 4lnc.  HR81, spo2=93%.

## 2014-09-28 NOTE — ED Notes (Addendum)
Pt with Hx of breast cancer, last chemo on Monday, c/o SOB, chest pain. Pt states today she vomited so hard that she passed out. C/o headache and dizziness. Labored breathing, auditory wheezing without use of stethoscope. O2 saturation 72 % on room air.

## 2014-09-29 ENCOUNTER — Inpatient Hospital Stay (HOSPITAL_COMMUNITY): Payer: Medicare Other

## 2014-09-29 ENCOUNTER — Encounter (HOSPITAL_COMMUNITY): Payer: Self-pay | Admitting: *Deleted

## 2014-09-29 DIAGNOSIS — E119 Type 2 diabetes mellitus without complications: Secondary | ICD-10-CM

## 2014-09-29 DIAGNOSIS — J9621 Acute and chronic respiratory failure with hypoxia: Secondary | ICD-10-CM

## 2014-09-29 DIAGNOSIS — I82622 Acute embolism and thrombosis of deep veins of left upper extremity: Secondary | ICD-10-CM

## 2014-09-29 DIAGNOSIS — C50212 Malignant neoplasm of upper-inner quadrant of left female breast: Secondary | ICD-10-CM

## 2014-09-29 LAB — PROTIME-INR
INR: 1.13 (ref 0.00–1.49)
Prothrombin Time: 14.6 seconds (ref 11.6–15.2)

## 2014-09-29 LAB — GLUCOSE, CAPILLARY
Glucose-Capillary: 276 mg/dL — ABNORMAL HIGH (ref 70–99)
Glucose-Capillary: 368 mg/dL — ABNORMAL HIGH (ref 70–99)
Glucose-Capillary: 338 mg/dL — ABNORMAL HIGH (ref 70–99)
Glucose-Capillary: 306 mg/dL — ABNORMAL HIGH (ref 70–99)

## 2014-09-29 LAB — MRSA PCR SCREENING: MRSA by PCR: NEGATIVE

## 2014-09-29 LAB — D-DIMER, QUANTITATIVE: D-Dimer, Quant: 0.58 ug/mL-FEU — ABNORMAL HIGH (ref 0.00–0.48)

## 2014-09-29 LAB — LACTIC ACID, PLASMA: Lactic Acid, Venous: 1.1 mmol/L (ref 0.5–2.0)

## 2014-09-29 LAB — STREP PNEUMONIAE URINARY ANTIGEN: Strep Pneumo Urinary Antigen: NEGATIVE

## 2014-09-29 MED ORDER — KETOROLAC TROMETHAMINE 30 MG/ML IJ SOLN
30.0000 mg | Freq: Once | INTRAMUSCULAR | Status: AC
  Administered 2014-09-29: 30 mg via INTRAVENOUS
  Filled 2014-09-29: qty 1

## 2014-09-29 MED ORDER — AMLODIPINE BESYLATE 5 MG PO TABS
5.0000 mg | ORAL_TABLET | Freq: Every day | ORAL | Status: DC
  Administered 2014-09-29 – 2014-10-01 (×3): 5 mg via ORAL
  Filled 2014-09-29 (×3): qty 1

## 2014-09-29 MED ORDER — IPRATROPIUM-ALBUTEROL 0.5-2.5 (3) MG/3ML IN SOLN
3.0000 mL | Freq: Four times a day (QID) | RESPIRATORY_TRACT | Status: DC
  Administered 2014-09-29 – 2014-10-01 (×7): 3 mL via RESPIRATORY_TRACT
  Filled 2014-09-29 (×7): qty 3

## 2014-09-29 MED ORDER — METOCLOPRAMIDE HCL 10 MG PO TABS: 10.0000 mg | ORAL_TABLET | Freq: Four times a day (QID) | ORAL | Status: DC | PRN

## 2014-09-29 MED ORDER — INSULIN ASPART 100 UNIT/ML ~~LOC~~ SOLN
0.0000 [IU] | Freq: Three times a day (TID) | SUBCUTANEOUS | Status: DC
  Administered 2014-09-29: 5 [IU] via SUBCUTANEOUS

## 2014-09-29 MED ORDER — IPRATROPIUM-ALBUTEROL 0.5-2.5 (3) MG/3ML IN SOLN: 3.0000 mL | RESPIRATORY_TRACT | Status: DC

## 2014-09-29 MED ORDER — ONDANSETRON HCL 4 MG PO TABS: 8.0000 mg | ORAL_TABLET | Freq: Two times a day (BID) | ORAL | Status: DC | PRN

## 2014-09-29 MED ORDER — IPRATROPIUM-ALBUTEROL 0.5-2.5 (3) MG/3ML IN SOLN
3.0000 mL | RESPIRATORY_TRACT | Status: DC
  Administered 2014-09-29: 3 mL via RESPIRATORY_TRACT
  Filled 2014-09-29: qty 3

## 2014-09-29 MED ORDER — HYDROCODONE-HOMATROPINE 5-1.5 MG/5ML PO SYRP
5.0000 mL | ORAL_SOLUTION | Freq: Four times a day (QID) | ORAL | Status: DC | PRN
  Administered 2014-09-29 (×2): 5 mL via ORAL
  Filled 2014-09-29 (×2): qty 5

## 2014-09-29 MED ORDER — CARVEDILOL 12.5 MG PO TABS
12.5000 mg | ORAL_TABLET | Freq: Every day | ORAL | Status: DC
  Administered 2014-09-29 – 2014-10-01 (×3): 12.5 mg via ORAL
  Filled 2014-09-29 (×3): qty 1

## 2014-09-29 MED ORDER — HEPARIN SODIUM (PORCINE) 5000 UNIT/ML IJ SOLN
5000.0000 [IU] | Freq: Three times a day (TID) | INTRAMUSCULAR | Status: DC
  Administered 2014-09-29 – 2014-10-01 (×8): 5000 [IU] via SUBCUTANEOUS
  Filled 2014-09-29 (×11): qty 1

## 2014-09-29 MED ORDER — IPRATROPIUM-ALBUTEROL 0.5-2.5 (3) MG/3ML IN SOLN
3.0000 mL | Freq: Four times a day (QID) | RESPIRATORY_TRACT | Status: DC
  Administered 2014-09-29: 3 mL via RESPIRATORY_TRACT
  Filled 2014-09-29: qty 3

## 2014-09-29 MED ORDER — IOHEXOL 350 MG/ML SOLN
100.0000 mL | Freq: Once | INTRAVENOUS | Status: AC | PRN
Start: 2014-09-29 — End: 2014-09-29
  Administered 2014-09-29: 100 mL via INTRAVENOUS

## 2014-09-29 MED ORDER — MORPHINE SULFATE 2 MG/ML IJ SOLN
2.0000 mg | Freq: Once | INTRAMUSCULAR | Status: AC
  Administered 2014-09-29: 2 mg via INTRAVENOUS
  Filled 2014-09-29: qty 1

## 2014-09-29 MED ORDER — LETROZOLE 2.5 MG PO TABS
2.5000 mg | ORAL_TABLET | Freq: Every day | ORAL | Status: DC
  Administered 2014-09-29 – 2014-10-01 (×3): 2.5 mg via ORAL
  Filled 2014-09-29 (×5): qty 1

## 2014-09-29 MED ORDER — SODIUM CHLORIDE 0.9 % IV SOLN
INTRAVENOUS | Status: DC
  Administered 2014-09-29: 01:00:00 via INTRAVENOUS

## 2014-09-29 MED ORDER — INSULIN ASPART 100 UNIT/ML ~~LOC~~ SOLN
0.0000 [IU] | Freq: Three times a day (TID) | SUBCUTANEOUS | Status: DC
  Administered 2014-09-29: 11 [IU] via SUBCUTANEOUS
  Administered 2014-09-29: 15 [IU] via SUBCUTANEOUS
  Administered 2014-09-30: 11 [IU] via SUBCUTANEOUS

## 2014-09-29 MED ORDER — ALBUTEROL SULFATE (2.5 MG/3ML) 0.083% IN NEBU
2.5000 mg/h | INHALATION_SOLUTION | RESPIRATORY_TRACT | Status: DC | PRN
  Filled 2014-09-29: qty 20

## 2014-09-29 MED ORDER — DM-GUAIFENESIN ER 30-600 MG PO TB12
1.0000 | ORAL_TABLET | Freq: Two times a day (BID) | ORAL | Status: DC
  Administered 2014-09-29 – 2014-10-01 (×5): 1 via ORAL
  Filled 2014-09-29 (×7): qty 1

## 2014-09-29 MED ORDER — METHYLPREDNISOLONE SODIUM SUCC 125 MG IJ SOLR
80.0000 mg | Freq: Three times a day (TID) | INTRAMUSCULAR | Status: DC
  Administered 2014-09-29 – 2014-10-01 (×8): 80 mg via INTRAVENOUS
  Filled 2014-09-29 (×2): qty 1.28
  Filled 2014-09-29: qty 2
  Filled 2014-09-29 (×7): qty 1.28

## 2014-09-29 MED ORDER — IPRATROPIUM-ALBUTEROL 0.5-2.5 (3) MG/3ML IN SOLN
3.0000 mL | RESPIRATORY_TRACT | Status: DC | PRN
  Filled 2014-09-29: qty 3

## 2014-09-29 MED ORDER — LORAZEPAM 0.5 MG PO TABS
0.5000 mg | ORAL_TABLET | Freq: Two times a day (BID) | ORAL | Status: DC | PRN
  Administered 2014-09-29 (×2): 0.5 mg via ORAL
  Filled 2014-09-29 (×2): qty 1

## 2014-09-29 MED ORDER — ASPIRIN EC 81 MG PO TBEC
81.0000 mg | DELAYED_RELEASE_TABLET | Freq: Every day | ORAL | Status: DC
  Administered 2014-09-29 – 2014-10-01 (×3): 81 mg via ORAL
  Filled 2014-09-29 (×4): qty 1

## 2014-09-29 MED ORDER — LEVOFLOXACIN IN D5W 750 MG/150ML IV SOLN
750.0000 mg | INTRAVENOUS | Status: DC
  Administered 2014-09-29 – 2014-09-30 (×2): 750 mg via INTRAVENOUS
  Filled 2014-09-29 (×3): qty 150

## 2014-09-29 NOTE — Progress Notes (Signed)
Patient ID: [name], female   DOB: 05-Sep-1950, 64 y.o.   MRN: [id number] [facility] PROGRESS NOTE  [name] [id number]:[id number] DOB: April 20, 1951 DOA: 09/28/2014 PCP: [name], MD  Brief narrative:    64 year old female with past medical history of stage IV breast cancer on chemotherapy with trastuzumab / pertuzumab (last infusion 09/19/2014) under Dr. Delight Stare care, history of asthma, hypertension, left ulnar vein DVT in 2014, has completed anticoagulation with xarelto for 1 year (completed 12/2013) who presented to [facility] ED with worsening shortness of breath and dry cough for the past couple of days prior to this admission but getting worse in last 24 hours. Patient reported feeling shortness of breath at rest and with exertion. On admission, blood pressure was 100/41, RR 12-25, oxygen saturation is low as 71% on room air but has improved to 98% with nasal cannula oxygen support. Blood work was essentially unremarkable. Chest x-ray showed basilar atelectasis, no acute cardiopulmonary findings. CT angiogram of the chest showed no definite evidence of pulmonary embolism.  Assessment/Plan:    Principal Problem: Acute respiratory failure with hypoxia / community-acquired pneumonia / asthma exacerbation - Hypoxia likely secondary to possible pneumonia and/or asthma exacerbation. Patient reports she does have history of asthma and she tried using inhalers at home with no significant improvement. - Respiratory status is stable at this time. She is using nasal cannula for oxygen support. - Patient was started empirically on Levaquin for possible community-acquired pneumonia. Strep pneumonia, Legionella and blood cultures are pending at this time. - We will continue nebulizer treatment, DuoNeb every 4 hours scheduled and every 4 hours as needed for shortness of breath or wheezing. - We will continue Solu-Medrol but will taper it down to 60 mg IV every 12 hours instead of 80 mg IV every 8  hours.  - Patient is stable for transfer to medical floor today. Order placed.  Active problems:  History of left ulnar vein DVT in 2014  - Patient was on anticoagulation from 2014 through 2015 (completed 12/2013 after being on xarelto for 1 year)  Stage IV breast cancer  - Follows with Dr. Jana Hakim - Last infusion with trastuzumab / pertuzumab 09/19/2014 - Continue Letrozole   Diabetes mellitus without complications - Obtained A1c - Patient is currently on sliding scale insulin - Monitor CBG's  Essential hypertension - Continue Norvasc 5 mg daily and Coreg 12.5 mg by mouth twice a day  Morbid obesity - Nutrition consulted   DVT Prophylaxis  - Heparin subcutaneous ordered   Code Status: Full.  Family Communication:  plan of care discussed with the patient Disposition Plan: transfer to medical floor today. If she continues to feel better likely discharge in next 24 hours.  IV access:  PAC  Procedures and diagnostic studies:    Ct Angio Chest Pe W/cm &/or Wo Cm 09/29/2014  1. No definite evidence of central pulmonary embolus. Evaluation is significantly suboptimal due to motion artifact and artifact from the patient's habitus. 2. Bibasilar atelectasis noted; lungs otherwise essentially clear.   Electronically Signed   By: Garald Balding M.D.   On: 09/29/2014 03:57   Dg Chest Port 1 View 09/28/2014  Low lung volumes with left basilar atelectasis.   Electronically Signed   By: Julian Hy M.D.   On: 09/28/2014 20:56   Medical Consultants:  None   Other Consultants:  Physical therapy  IAnti-Infectives:   Levaquin 09/28/2014 -->   Leisa Lenz, MD  [facility] Pager [phone]  If 7PM-7AM, please  contact night-coverage [email or web address] Password [email or web address] 09/29/2014, 8:01 AM   LOS: 1 day    HPI/Subjective: No acute overnight events.  Objective: Filed Vitals:   09/29/14 0400 09/29/14 0500 09/29/14 0600 09/29/14 0751  BP: 124/50 100/41 117/57   Pulse: 73 65  59   Temp:      TempSrc:      Resp: 13 12 12    Height:      Weight:      SpO2: 95% 95% 95% 95%    Intake/Output Summary (Last 24 hours) at 09/29/14 0801 Last data filed at 09/29/14 0600  Gross per 24 hour  Intake 244.16 ml  Output    350 ml  Net -105.84 ml    Exam:   General:  Pt is alert, follows commands appropriately, not in acute distress  Cardiovascular: Regular rate and rhythm, S1/S2, no murmurs  Respiratory: Diminished breath sounds, bilateral air entry  Abdomen: Soft, non tender, non distended, bowel sounds present  Extremities: Trace pitting lower extremity edema, pulses DP and PT palpable bilaterally  Neuro: Grossly nonfocal  Data Reviewed: Basic Metabolic Panel:  Recent Labs Lab 09/28/14 1918  NA 133*  K 3.9  CL 91*  CO2 34*  GLUCOSE 248*  BUN 13  CREATININE 0.79  CALCIUM 9.0   Liver Function Tests: No results for input(s): AST, ALT, ALKPHOS, BILITOT, PROT, ALBUMIN in the last 168 hours. No results for input(s): LIPASE, AMYLASE in the last 168 hours. No results for input(s): AMMONIA in the last 168 hours. CBC:  Recent Labs Lab 09/28/14 1918  WBC 6.8  NEUTROABS 4.7  HGB 14.8  HCT 45.6  MCV 89.4  PLT 315   Cardiac Enzymes: No results for input(s): CKTOTAL, CKMB, CKMBINDEX, TROPONINI in the last 168 hours. BNP: Invalid input(s): POCBNP CBG: No results for input(s): GLUCAP in the last 168 hours.  Recent Results (from the past 240 hour(s))  MRSA PCR Screening     Status: None   Collection Time: 09/29/14  1:41 AM  Result Value Ref Range Status   MRSA by PCR NEGATIVE NEGATIVE Final     Scheduled Meds: . amLODipine  5 mg Oral Daily  . aspirin EC  81 mg Oral Daily  . carvedilol  12.5 mg Oral Daily  . dextromethorphan-guaiFENesin  1 tablet Oral BID  . heparin  5,000 Units Subcutaneous 3 times per day  . insulin aspart  0-9 Units Subcutaneous TID WC  . ipratropium-albuterol  3 mL Nebulization Q6H  . letrozole  2.5 mg Oral Daily  .  methylPREDNISolone (SOLU-MEDROL) injection  80 mg Intravenous Q8H   Continuous Infusions: . sodium chloride 50 mL/hr at 09/29/14 0107

## 2014-09-29 NOTE — Plan of Care (Signed)
Problem: Food- and Nutrition-Related Knowledge Deficit (NB-1.1) Goal: Nutrition education Formal process to instruct or train a patient/client in a skill or to impart knowledge to help patients/clients voluntarily manage or modify food choices and eating behavior to maintain or improve health. Outcome: Completed/Met Date Met:  09/29/14 Nutrition Education Note  RD consulted for nutrition education regarding new onset CHF.  RD provided "Low Sodium Nutrition Therapy" handout from the Academy of [employer]. Reviewed patient's dietary recall. Provided examples on ways to decrease sodium intake in diet. Discouraged intake of processed foods and use of salt shaker. Encouraged fresh fruits and vegetables as well as whole grain sources of carbohydrates to maximize fiber intake.   RD discussed why it is important for patient to adhere to diet recommendations, and emphasized the role of fluids, foods to avoid, and importance of weighing self daily. Teach back method used.  Expect good compliance.  Body mass index is 52.61 kg/(m^2). Pt meets criteria for morbid obesity based on current BMI.  Current diet order is carbohydrate modified, patient is consuming approximately 100% of meals at this time. Labs and medications reviewed. No further nutrition interventions warranted at this time. RD contact information provided. If additional nutrition issues arise, please re-consult RD.   Laurette Schimke MS, RD, LDN

## 2014-09-29 NOTE — Progress Notes (Signed)
Pt c/o chronic back pain. Called midlevel to get pain mediation.

## 2014-09-29 NOTE — Evaluation (Signed)
Physical Therapy Evaluation Patient Details Name: ALLETTA MATTOS MRN: [id number] DOB: 1950-12-31 Today's Date: 09/29/2014   History of Present Illness  HPI: LATERIA ALDERMAN is a 64 y.o. 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 Negative for PE,   Clinical Impression  Patient is mobilizing well, feels that she is near her baseline, ambulating to bathroom, in room with PT. Patient will benefit from PT to Address problems listed in note below. Sats > 92 % on 2 l.    Follow Up Recommendations No PT follow up    Equipment Recommendations  None recommended by PT    Recommendations for Other Services       Precautions / Restrictions Precautions Precaution Comments: on home O, limited mobility PTA      Mobility  Bed Mobility Overal bed mobility: Independent                Transfers Overall transfer level: Modified independent                  Ambulation/Gait Ambulation/Gait assistance: Supervision Ambulation Distance (Feet): 20 Feet Assistive device:  (held onto IV pole) Gait Pattern/deviations: WFL(Within Functional Limits) Gait velocity: slow   General Gait Details: pt tends to hold to bed , IV pole, states she cruises at home  Stairs            Wheelchair Mobility    Modified Rankin (Stroke Patients Only)       Balance Overall balance assessment: Needs assistance         Standing balance support: During functional activity;No upper extremity supported Standing balance-Leahy Scale: Fair                               Pertinent Vitals/Pain Pain Assessment: 0-10 Pain Score: 4  Pain Location: back. feet Pain Descriptors / Indicators: Aching;Discomfort Pain Intervention(s): Monitored during session;Limited activity within patient's tolerance    Home Living  Family/patient expects to be discharged to:: Private residence Living Arrangements: Spouse/significant other Available Help at Discharge: Family Type of Home: House Home Access: Stairs to enter   Technical brewer of Steps: 1 Home Layout: One level        Prior Function           Comments: limited to 3 minutes walking due to back pain     Hand Dominance        Extremity/Trunk Assessment   Upper Extremity Assessment: Overall WFL for tasks assessed           Lower Extremity Assessment: Overall WFL for tasks assessed         Communication   Communication: No difficulties  Cognition Arousal/Alertness: Awake/alert Behavior During Therapy: WFL for tasks assessed/performed Overall Cognitive Status: Within Functional Limits for tasks assessed                      General Comments      Exercises        Assessment/Plan    PT Assessment Patient needs continued PT services  PT Diagnosis Generalized weakness;Difficulty walking   PT Problem List Decreased strength;Decreased activity tolerance;Decreased mobility;Decreased knowledge of precautions;Decreased safety awareness;Decreased knowledge of use of DME;Pain  PT Treatment Interventions DME instruction;Gait training;Functional mobility training;Therapeutic activities;Therapeutic exercise;Patient/family education   PT Goals (Current  goals can be found in the Care Plan section) Acute Rehab PT Goals Patient Stated Goal: to go home tomorrow. PT Goal Formulation: With patient Time For Goal Achievement: 10/13/14 Potential to Achieve Goals: Good    Frequency Min 3X/week   Barriers to discharge        Co-evaluation               End of Session   Activity Tolerance: Patient tolerated treatment well Patient left: in chair;with call bell/phone within reach Nurse Communication: Mobility status         Time: 8978-4784 PT Time Calculation (min) (ACUTE ONLY): 19 min   Charges:   PT  Evaluation $Initial PT Evaluation Tier I: 1 Procedure     PT G CodesClaretha Cooper 09/29/2014, 3:31 PM Tresa Endo PT [phone]

## 2014-09-29 NOTE — Progress Notes (Signed)
UR complete 

## 2014-09-29 NOTE — Procedures (Signed)
Bipap placed on pt without complications.  Settings auto flex with 4L of oxygen and full face mask use. Pt resting comfortably at this time.

## 2014-09-30 DIAGNOSIS — J9601 Acute respiratory failure with hypoxia: Secondary | ICD-10-CM

## 2014-09-30 DIAGNOSIS — R918 Other nonspecific abnormal finding of lung field: Secondary | ICD-10-CM

## 2014-09-30 LAB — GLUCOSE, CAPILLARY
Glucose-Capillary: 303 mg/dL — ABNORMAL HIGH (ref 70–99)
Glucose-Capillary: 304 mg/dL — ABNORMAL HIGH (ref 70–99)
Glucose-Capillary: 362 mg/dL — ABNORMAL HIGH (ref 70–99)

## 2014-09-30 LAB — HEMOGLOBIN A1C
Hgb A1c MFr Bld: 9 % — ABNORMAL HIGH (ref 4.8–5.6)
Mean Plasma Glucose: 212 mg/dL

## 2014-09-30 MED ORDER — INSULIN ASPART 100 UNIT/ML ~~LOC~~ SOLN
0.0000 [IU] | Freq: Every day | SUBCUTANEOUS | Status: DC
  Administered 2014-09-30: 3 [IU] via SUBCUTANEOUS

## 2014-09-30 MED ORDER — INSULIN GLARGINE 100 UNIT/ML ~~LOC~~ SOLN
10.0000 [IU] | Freq: Every day | SUBCUTANEOUS | Status: DC
  Administered 2014-09-30: 10 [IU] via SUBCUTANEOUS
  Filled 2014-09-30: qty 0.1

## 2014-09-30 MED ORDER — INSULIN ASPART 100 UNIT/ML ~~LOC~~ SOLN
6.0000 [IU] | Freq: Three times a day (TID) | SUBCUTANEOUS | Status: DC
Start: 2014-09-30 — End: 2014-10-01
  Administered 2014-09-30 (×2): 6 [IU] via SUBCUTANEOUS

## 2014-09-30 MED ORDER — INSULIN ASPART 100 UNIT/ML ~~LOC~~ SOLN
0.0000 [IU] | Freq: Three times a day (TID) | SUBCUTANEOUS | Status: DC
  Administered 2014-09-30: 15 [IU] via SUBCUTANEOUS
  Administered 2014-09-30: 20 [IU] via SUBCUTANEOUS
  Administered 2014-10-01: 11 [IU] via SUBCUTANEOUS
  Administered 2014-10-01: 20 [IU] via SUBCUTANEOUS
  Administered 2014-10-01: 11 [IU] via SUBCUTANEOUS

## 2014-09-30 NOTE — Progress Notes (Signed)
Pt stated that she will call when she is ready for her CPAP. RT will monitor.

## 2014-09-30 NOTE — Progress Notes (Signed)
Inpatient Diabetes Program Recommendations  AACE/ADA: New Consensus Statement on Inpatient Glycemic Control (2013)  Target Ranges:  Prepandial:   less than 140 mg/dL      Peak postprandial:   less than 180 mg/dL (1-2 hours)      Critically ill patients:  140 - 180 mg/dL   Agree with current orders. Will follow. Thank you  Raoul Pitch BSN, RN,CDE Inpatient Diabetes Coordinator [phone] (team pager)

## 2014-09-30 NOTE — Progress Notes (Signed)
Progress Note   XEE HOLLMAN MKL:[id number] DOB: 1950/09/26 DOA: 09/28/2014 PCP: [name], MD   Brief Narrative:   [name] is an 64 y.o. female with a PMH of stage IV breast cancer on chemotherapy with trastuzumab / pertuzumab (last infusion 09/19/2014) under Dr. Delight Stare care, history of asthma, hypertension, left ulnar vein DVT in 2014, has completed anticoagulation with xarelto for 1 year (completed 12/2013) who presented to [facility] ED with a 2 day history of worsening shortness of breath and dry cough. On initial evaluation, blood pressure was 100/41, RR 12-25, oxygen saturation is low as 71% on room air but improved to 98% with nasal cannula oxygen support. Blood work was essentially unremarkable. Chest x-ray showed basilar atelectasis, no acute cardiopulmonary findings. CT angiogram of the chest showed no definite evidence of pulmonary embolism.  Assessment/Plan:   Principal Problem:   Acute on chronic respiratory failure with hypoxia / probable community-acquired pneumonia / asthma exacerbation / shortness of breath - Hypoxia likely secondary to community-acquired pneumonia with asthma exacerbation.  - Continue supplemental oxygen to maintain oxygen saturation. Continue BiPAP daily at bedtime. -Continue Levaquin for probable community-acquired pneumonia. Strep pneumonia negative, Legionella and blood cultures are pending at this time. - Continue nebulizer treatment, DuoNeb every 4 hours scheduled and every 4 hours as needed for shortness of breath or wheezing. - Continue Solu-Medrol, wean as tolerated.   Active problems:  History of left ulnar vein DVT in 2014  - Patient was on anticoagulation from 2014 through 2015 (completed 12/2013 after being on xarelto for 1 year).  Stage IV breast cancer  - Follows with Dr. Jana Hakim. - Last infusion with trastuzumab / pertuzumab 09/19/2014. - Continue Letrozole.   Diabetes mellitus without complications - Hemoglobin A1c  9%, indicating poor outpatient control with an average glucose of 212. - Diabetes coordinator consultation requested. -CBGs [id number], likely reflective of treatment with steroids. -Add Lantus 10 units daily, change SSI to insulin resistant scale and add 6 units of meal coverage every before meals.  Essential hypertension - Continue Norvasc 5 mg daily and Coreg 12.5 mg by mouth twice a day.  Morbid obesity - BMI 52.7. Nutrition consulted.   DVT Prophylaxis  - Heparin subcutaneous ordered  Code Status: Full. Family Communication: No family at the bedside. Disposition Plan: Home when stable and oxygen saturations improved.  Lives with son and Truman Hayward (her significant other) who helps provide care.     IV Access:    Port-A-Cath   Procedures and diagnostic studies:   Ct Angio Chest Pe W/cm &/or Wo Cm 09/29/2014: 1. No definite evidence of central pulmonary embolus. Evaluation is significantly suboptimal due to motion artifact and artifact from the patient's habitus. 2. Bibasilar atelectasis noted; lungs otherwise essentially clear.     Dg Chest Port 1 View 09/28/2014: Low lung volumes with left basilar atelectasis.     Medical Consultants:    None.  Anti-Infectives:    Levaquin 09/28/14--->  Subjective:   [name] continues to be short of breath with wheezing.  Has a cough productive of yellow sputum.  Appetite okay.  Nausea improved.  Bowels moved yesterday.  Objective:    Filed Vitals:   09/29/14 2000 09/29/14 2200 09/30/14 0200 09/30/14 0600  BP:  127/61  126/69  Pulse:  71  74  Temp:  97.6 F (36.4 C)  98.4 F (36.9 C)  TempSrc:  Oral  Oral  Resp:  16  16  Height:  Weight:      SpO2: 93% 94% 94% 92%    Intake/Output Summary (Last 24 hours) at 09/30/14 0831 Last data filed at 09/29/14 0900  Gross per 24 hour  Intake    100 ml  Output      0 ml  Net    100 ml    Exam: Gen:  NAD Cardiovascular:  RRR, No M/R/G Respiratory:  Lungs with expiratory  wheezes, diminished throughout Gastrointestinal:  Abdomen soft, NT/ND, + BS Extremities: 1+ edema   Data Reviewed:    Labs: Basic Metabolic Panel:  Recent Labs Lab 09/28/14 1918  NA 133*  K 3.9  CL 91*  CO2 34*  GLUCOSE 248*  BUN 13  CREATININE 0.79  CALCIUM 9.0   GFR Estimated Creatinine Clearance: 102.7 mL/min (by C-G formula based on Cr of 0.79).   Recent Labs Lab 09/29/14 0104  INR 1.13    CBC:  Recent Labs Lab 09/28/14 1918  WBC 6.8  NEUTROABS 4.7  HGB 14.8  HCT 45.6  MCV 89.4  PLT 315   CBG:  Recent Labs Lab 09/29/14 0757 09/29/14 1138 09/29/14 1649 09/29/14 2150 09/30/14 0751  GLUCAP 276* 368* 338* 306* 303*   D-Dimer:  Recent Labs  09/28/14 2242  DDIMER 0.58*   Hgb A1c:  Recent Labs  09/29/14 1003  HGBA1C 9.0*   Sepsis Labs:  Recent Labs Lab 09/28/14 1918 09/29/14 0208  WBC 6.8  --   LATICACIDVEN  --  1.1   Microbiology Recent Results (from the past 240 hour(s))  MRSA PCR Screening     Status: None   Collection Time: 09/29/14  1:41 AM  Result Value Ref Range Status   MRSA by PCR NEGATIVE NEGATIVE Final    Comment:        The GeneXpert MRSA Assay (FDA approved for NASAL specimens only), is one component of a comprehensive MRSA colonization surveillance program. It is not intended to diagnose MRSA infection nor to guide or monitor treatment for MRSA infections.      Medications:   . amLODipine  5 mg Oral Daily  . aspirin EC  81 mg Oral Daily  . carvedilol  12.5 mg Oral Daily  . dextromethorphan-guaiFENesin  1 tablet Oral BID  . heparin  5,000 Units Subcutaneous 3 times per day  . insulin aspart  0-15 Units Subcutaneous TID WC  . ipratropium-albuterol  3 mL Nebulization Q6H  . letrozole  2.5 mg Oral Daily  . levofloxacin (LEVAQUIN) IV  750 mg Intravenous Q24H  . methylPREDNISolone (SOLU-MEDROL) injection  80 mg Intravenous Q8H   Continuous Infusions: . sodium chloride 50 mL/hr at 09/29/14 0700     Time spent: 35 minutes with > 50% of time discussing current diagnostic test results, clinical impression and plan of care.    LOS: 2 days   Kayleena Eke  [facility] Pager [phone]. If unable to reach me by pager, please call my cell phone at [phone].  *Please refer to [email or web address], password [email or web address] to get updated schedule on who will round on this patient, as hospitalists switch teams weekly. If 7PM-7AM, please contact night-coverage at [email or web address], password [email or web address] for any overnight needs.  09/30/2014, 8:31 AM

## 2014-10-01 LAB — CBC
HCT: 42.5 % (ref 36.0–46.0)
Hemoglobin: 13.3 g/dL (ref 12.0–15.0)
MCH: 28.3 pg (ref 26.0–34.0)
MCHC: 31.3 g/dL (ref 30.0–36.0)
MCV: 90.4 fL (ref 78.0–100.0)
Platelets: 256 10*3/uL (ref 150–400)
RBC: 4.7 MIL/uL (ref 3.87–5.11)
RDW: 14.3 % (ref 11.5–15.5)
WBC: 7.5 10*3/uL (ref 4.0–10.5)

## 2014-10-01 LAB — BASIC METABOLIC PANEL
Anion gap: 8 (ref 5–15)
BUN: 29 mg/dL — ABNORMAL HIGH (ref 6–23)
CO2: 32 mmol/L (ref 19–32)
Calcium: 8.8 mg/dL (ref 8.4–10.5)
Chloride: 98 mmol/L (ref 96–112)
Creatinine, Ser: 0.71 mg/dL (ref 0.50–1.10)
GFR calc Af Amer: >90 mL/min (ref >90)
GFR calc non Af Amer: 89 mL/min — ABNORMAL LOW (ref >90)
Glucose, Bld: 354 mg/dL — ABNORMAL HIGH (ref 70–99)
Potassium: 4.5 mmol/L (ref 3.5–5.1)
Sodium: 138 mmol/L (ref 135–145)

## 2014-10-01 LAB — GLUCOSE, CAPILLARY
Glucose-Capillary: 278 mg/dL — ABNORMAL HIGH (ref 70–99)
Glucose-Capillary: 371 mg/dL — ABNORMAL HIGH (ref 70–99)
Glucose-Capillary: 297 mg/dL — ABNORMAL HIGH (ref 70–99)
Glucose-Capillary: 263 mg/dL — ABNORMAL HIGH (ref 70–99)

## 2014-10-01 MED ORDER — INSULIN GLARGINE 100 UNIT/ML ~~LOC~~ SOLN
20.0000 [IU] | Freq: Every day | SUBCUTANEOUS | Status: DC
  Filled 2014-10-01: qty 0.2

## 2014-10-01 MED ORDER — BD LATITUDE DIABETES SYSTEM KIT
1.0000 | PACK | Freq: Once | Status: AC
  Administered 2014-10-01: 1
  Filled 2014-10-01: qty 1

## 2014-10-01 MED ORDER — DM-GUAIFENESIN ER 30-600 MG PO TB12: 1.0000 | ORAL_TABLET | Freq: Two times a day (BID) | ORAL | Status: DC

## 2014-10-01 MED ORDER — INSULIN GLARGINE 100 UNIT/ML SOLOSTAR PEN: 20.0000 [IU] | PEN_INJECTOR | Freq: Every day | SUBCUTANEOUS | Status: DC

## 2014-10-01 MED ORDER — IPRATROPIUM-ALBUTEROL 0.5-2.5 (3) MG/3ML IN SOLN: 3.0000 mL | Freq: Four times a day (QID) | RESPIRATORY_TRACT | Status: DC

## 2014-10-01 MED ORDER — PREDNISONE (PAK) 10 MG PO TABS: ORAL_TABLET | ORAL | Status: DC

## 2014-10-01 MED ORDER — INSULIN ASPART 100 UNIT/ML ~~LOC~~ SOLN
8.0000 [IU] | Freq: Three times a day (TID) | SUBCUTANEOUS | Status: DC
Start: 2014-10-01 — End: 2014-10-01
  Administered 2014-10-01 (×3): 8 [IU] via SUBCUTANEOUS

## 2014-10-01 MED ORDER — INSULIN ASPART 100 UNIT/ML FLEXPEN: 8.0000 [IU] | PEN_INJECTOR | Freq: Three times a day (TID) | SUBCUTANEOUS | Status: DC

## 2014-10-01 MED ORDER — LEVOFLOXACIN 750 MG PO TABS: 750.0000 mg | ORAL_TABLET | Freq: Every day | ORAL | Status: DC

## 2014-10-01 MED ORDER — LIVING WELL WITH DIABETES BOOK
Freq: Once | Status: AC
  Administered 2014-10-01: 12:00:00
  Filled 2014-10-01: qty 1

## 2014-10-01 MED ORDER — ALBUTEROL SULFATE (2.5 MG/3ML) 0.083% IN NEBU: 2.5000 mg | INHALATION_SOLUTION | RESPIRATORY_TRACT | Status: DC | PRN

## 2014-10-01 NOTE — Progress Notes (Signed)
Pt d/c home at this time with her daughter and daughter's fiancee. Celesta Gentile a previous EMT and aware of how to give insulin injections and states that "they" do not need further instruction. Instruction given to pt, as well as Diabetes booklet, start kit, and diabetes videos watched.  Discharge instructions/prescription given/explained with pt and family verbalizing understanding. Followup appointments noted. Pt says she will "finish PO steriods and probably leave the diabetes alone, cause that is what I did before". Difficult discharge as Probation officer feels that pt will not be compliant with her DM. Pt states she will talk with her Oncologist about her diabetes and see if she will need her PCP. Oxygen delivered by [facility] and pt left with full tank and nasal cannula to 2 l/min O2. Hospital bed to be delivered this week; Oxygen concentrator and Neb machine to be delivered today by [facility], per CM. Pt alert, oriented, and without c/o upon d/c.

## 2014-10-01 NOTE — Progress Notes (Signed)
Occupational Therapy Evaluation Patient Details Name: KARISSA MEENAN MRN: [id number] DOB: 1951-06-04 Today's Date: 10/01/2014    History of Present Illness HPI: MARISABEL MACPHERSON is a 64 y.o. 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 Negative for OE.    Clinical Impression   Completed education with pt/family regarding use of AE for ADL. Pt would benefit from a hospital bed to increase independence with bed mobility and improve respiratory status - Pt unable to sleep lying down. Pt asking questions about managing her diabetes - notified nursing. Anticipate D/C this pm.     Follow Up Recommendations  No OT follow up;Supervision - Intermittent    Equipment Recommendations  Hospital bed    Recommendations for Other Services       Precautions / Restrictions Precautions Precaution Comments: on home O, limited mobility PTA Restrictions Weight Bearing Restrictions: No      Mobility Bed Mobility               General bed mobility comments: pt up in chair. Pt unable to lie down in bed due to difficulty breathing. Bed rails assist pt with mobility.Must sleep with increased HOB.  Transfers Overall transfer level: Modified independent                    Balance Overall balance assessment: Needs assistance         Standing balance support: During functional activity Standing balance-Leahy Scale: Fair                              ADL Overall ADL's : Needs assistance/impaired     Grooming: Set up   Upper Body Bathing: Set up   Lower Body Bathing: Minimal assistance;Sit to/from stand   Upper Body Dressing : Set up   Lower Body Dressing: Minimal assistance;Sit to/from stand   Toilet Transfer: [place] and Hygiene:  Minimal assistance;Sit to/from stand Toileting - Clothing Manipulation Details (indicate cue type and reason): difficulty reaching due to body habitus     Functional mobility during ADLs: Modified independent General ADL Comments: Pt issued AE to assist with LB bathing and dressing; toilet aid to assist with hygiene after toileting. Pt able to return demonstrate. Educated on E conservation. Pt very appreciative of AE. Pt able to be at mod I level with AE. Family member also instructed.                     Pertinent Vitals/Pain Pain Assessment: 0-10 Pain Score: 3  Pain Location: back Pain Descriptors / Indicators: Aching Pain Intervention(s): Limited activity within patient's tolerance;Monitored during session     Hand Dominance Right   Extremity/Trunk Assessment Upper Extremity Assessment Upper Extremity Assessment: Overall WFL for tasks assessed   Lower Extremity Assessment Lower Extremity Assessment: Overall WFL for tasks assessed   Cervical / Trunk Assessment Cervical / Trunk Assessment: Normal   Communication Communication Communication: No difficulties   Cognition Arousal/Alertness: Awake/alert Behavior During Therapy: WFL for tasks assessed/performed Overall Cognitive Status: Within Functional Limits for tasks assessed  Home Living Family/patient expects to be discharged to:: Private residence Living Arrangements: Spouse/significant other Available Help at Discharge: Family Type of Home: House Home Access: Stairs to enter Technical brewer of Steps: 1   [place]: One level     Bathroom Shower/Tub: Tub/shower unit Shower/tub characteristics: Architectural technologist: Standard Bathroom Accessibility: Yes How Accessible: Accessible via walker Home Equipment: Gilford Rile - 2 wheels;Shower seat          Prior Functioning/Environment          Comments: difficulty with LB ADL and hygiene after  toileting    OT Diagnosis: Generalized weakness   OT Problem List: Decreased knowledge of use of DME or AE;Obesity;Decreased activity tolerance   OT Treatment/Interventions:      OT Goals(Current goals can be found in the care plan section) Acute Rehab OT Goals Patient Stated Goal: to go home tomorrow. OT Goal Formulation: All assessment and education complete, DC therapy  OT Frequency:     Barriers to D/C:            Co-evaluation              End of Session Equipment Utilized During Treatment: Oxygen (2L) Nurse Communication: Mobility status;Other (comment) (D/C needs)  Activity Tolerance: Patient tolerated treatment well Patient left: in chair;with call bell/phone within reach;with family/visitor present   Time: 1330-1400 OT Time Calculation (min): 30 min Charges:  OT General Charges $OT Visit: 1 Procedure OT Evaluation $Initial OT Evaluation Tier I: 1 Procedure OT Treatments $Self Care/Home Management : 8-22 mins G-Codes:    Solenne Manwarren,HILLARY 10-25-14, 2:27 PM   [facility], OTR/L  [phone] 10/25/14

## 2014-10-01 NOTE — Progress Notes (Signed)
Writer informed by Diabetes Coordinator that they do not come to hospital on the weekends.  Will show pt videos and call for Dietitian. Living Well with Diabetes book to be given.

## 2014-10-01 NOTE — Progress Notes (Signed)
SATURATION QUALIFICATIONS: (This note is used to comply with regulatory documentation for home oxygen)  Patient Saturations on Room Air at Rest = 93  Patient Saturations on Room Air while Ambulating = 88  Patient Saturations on 2 Liters of oxygen while Ambulating = 94  Please briefly explain why patient needs home oxygen: decrease in O2 sats when ambulating short distances

## 2014-10-01 NOTE — Progress Notes (Signed)
NCM ordered oxygen and neb machine with [facility]. Jonnie Finner RN CCM Case Mgmt phone [phone]

## 2014-10-01 NOTE — Discharge Instructions (Signed)
Blood Glucose Monitoring °Monitoring your blood glucose (also know as blood sugar) helps you to manage your diabetes. It also helps you and your health care provider monitor your diabetes and determine how well your treatment plan is working. °WHY SHOULD YOU MONITOR YOUR BLOOD GLUCOSE? °· It can help you understand how food, exercise, and medicine affect your blood glucose. °· It allows you to know what your blood glucose is at any given moment. You can quickly tell if you are having low blood glucose (hypoglycemia) or high blood glucose (hyperglycemia). °· It can help you and your health care provider know how to adjust your medicines. °· It can help you understand how to manage an illness or adjust medicine for exercise. °WHEN SHOULD YOU TEST? °Your health care provider will help you decide how often you should check your blood glucose. This may depend on the type of diabetes you have, your diabetes control, or the types of medicines you are taking. Be sure to write down all of your blood glucose readings so that this information can be reviewed with your health care provider. See below for examples of testing times that your health care provider may suggest. °Type 1 Diabetes °· Test 4 times a day if you are in good control, using an insulin pump, or perform multiple daily injections. °· If your diabetes is not well controlled or if you are sick, you may need to monitor more often. °· It is a good idea to also monitor: °· Before and after exercise. °· Between meals and 2 hours after a meal. °· Occasionally between 2:00 a.m. and 3:00 a.m. °Type 2 Diabetes °· It can vary with each person, but generally, if you are on insulin, test 4 times a day. °· If you take medicines by mouth (orally), test 2 times a day. °· If you are on a controlled diet, test once a day. °· If your diabetes is not well controlled or if you are sick, you may need to monitor more often. °HOW TO MONITOR YOUR BLOOD GLUCOSE °Supplies  Needed °· Blood glucose meter. °· Test strips for your meter. Each meter has its own strips. You must use the strips that go with your own meter. °· A pricking needle (lancet). °· A device that holds the lancet (lancing device). °· A journal or log book to write down your results. °Procedure °· Wash your hands with soap and water. Alcohol is not preferred. °· Prick the side of your finger (not the tip) with the lancet. °· Gently milk the finger until a small drop of blood appears. °· Follow the instructions that come with your meter for inserting the test strip, applying blood to the strip, and using your blood glucose meter. °Other Areas to Get Blood for Testing °Some meters allow you to use other areas of your body (other than your finger) to test your blood. These areas are called alternative sites. The most common alternative sites are: °· The forearm. °· The thigh. °· The back area of the lower leg. °· The palm of the hand. °The blood flow in these areas is slower. Therefore, the blood glucose values you get may be delayed, and the numbers are different from what you would get from your fingers. Do not use alternative sites if you think you are having hypoglycemia. Your reading will not be accurate. Always use a finger if you are having hypoglycemia. Also, if you cannot feel your lows (hypoglycemia unawareness), always use your fingers for your   blood glucose checks. ADDITIONAL TIPS FOR GLUCOSE MONITORING  Do not reuse lancets.  Always carry your supplies with you.  All blood glucose meters have a 24-hour "hotline" number to call if you have questions or need help.  Adjust (calibrate) your blood glucose meter with a control solution after finishing a few boxes of strips. BLOOD GLUCOSE RECORD KEEPING It is a good idea to keep a daily record or log of your blood glucose readings. Most glucose meters, if not all, keep your glucose records stored in the meter. Some meters come with the ability to download  your records to your home computer. Keeping a record of your blood glucose readings is especially helpful if you are wanting to look for patterns. Make notes to go along with the blood glucose readings because you might forget what happened at that exact time. Keeping good records helps you and your health care provider to work together to achieve good diabetes management.  Document Released: 08/07/2003 Document Revised: 12/19/2013 Document Reviewed: 12/27/2012 [facility] Patient Information 2015 [place], [name]. This information is not intended to replace advice given to you by your health care provider. Make sure you discuss any questions you have with your health care provider.  How and Where to Give Subcutaneous Insulin Injections, Adult People with type 1 diabetes must take insulin since their bodies do not make it. People with type 2 diabetes may require insulin. There are many different types of insulin as well as other injectable diabetes medicines that are meant to be injected into the fat layer under your skin. The type of insulin or injectable diabetes medicine you take may determine how many injections you give yourself and when to take the injections.  CHOOSING A SITE FOR INJECTION Insulin absorption varies from site to site. As with any injectable medication it is best for the insulin to be injected within the same body region. However, do not inject the insulin in the same spot each time. Rotating the spots you give your injections will prevent inflammation or tissue breakdown. There are four main regions that can be used for injections. The regions include the:  Abdomen (preferred region, especially for non-insulin injectable diabetes medicine).  Front and upper outer sides of thighs.  Back of upper arm.  Buttocks. USING A SYRINGE AND VIAL Drawing up insulin: single insulin dose  Wash your hands with soap and water.  Gently roll the insulin bottle (vial) between your hands to mix  it. Do not shake the vial.  Clean the top rubber part of the vial with an alcohol wipe. Be sure that the plastic pop-top has been removed on newer vials.  Remove the plastic cover from the needle on the syringe. Do not let the needle touch anything.  Pull the plunger back to draw air into the syringe. The air should be the same amount as the insulin dose.  Push the needle through the rubber on the top of the vial. Do not turn the vial over.  Push the plunger in all the way to put the air into the vial.  Leave the needle in the vial and turn the vial and syringe upside down.  Pull down slowly on the plunger, drawing the amount of insulin you need into the syringe.  Look for air bubbles in the syringe. You may need to push the plunger up and down 2 to 3 times to slowly get rid of any air bubbles in the syringe.  Pull back the plunger to get your  correct dose.  Remove the needle from the vial.  Use an alcohol wipe to clean the area of the body to be injected.  Pinch up 1 inch of skin and hold it.  Put the needle straight into the skin (90-degree angle). Put the needle in as far as it will go (to the hub). The needle may need to be injected at a 45-degree angle in small adults with little fat.  When the needle is in, you can let go of your skin.  Push the plunger down all the way to inject the insulin.  Pull the needle straight out of the skin.  Press the alcohol wipe over the spot where you gave your injection. Keep it there for a few seconds. Do not rub the area.  Do not put the plastic cover back on the needle. Drawing up insulin: mixing 2 insulins  Wash your hands with soap and water.  Gently roll the vial of "cloudy" insulin between your hands or rotate the vial from top to bottom to mix.  Clean the top of both vials with an alcohol wipe. Be sure that the plastic pop-top lid has been removed on newer vials.  Pull air into the syringe to equal the dose of "cloudy"  insulin.  Stick the needle into the "cloudy" insulin vial and inject the air. Be sure to keep the vial upright.  Remove the needle from the "cloudy" insulin vial.  Pull air into the syringe to equal the dose of "clear" insulin.  Stick the needle into the "clear" insulin vial and inject the air.  Leave the needle in the "clear" insulin vial and turn the vial upside down.  Pull down on the plunger and slowly draw into the syringe the number of units of "clear" insulin desired.  Look for air bubbles in the syringe. You may need to push the plunger up and down 2 to 3 times to slowly get rid of any air bubbles in the syringe.  Remove the needle from the "clear" insulin vial.  Stick the needle into the "cloudy" insulin vial. Do not inject any of the "clear" insulin into the "cloudy" vial.  Turn the "cloudy" vial upside down and pull the plunger down to the number of units that equals the total number of units of "clear" and "cloudy" insulins.  Remove the needle from the "cloudy" insulin vial.  Use an alcohol wipe to clean the area of the body to be injected.  Put the needle straight into the skin (90-degree angle). Put the needle in as far as it will go (to the hub). The needle may need to be injected at a 45-degree angle in small adults with little fat.  When the needle is in, you can let go of your skin.  Push the plunger down all the way to inject the insulin.  Pull the needle straight out of the skin.  Press the alcohol wipe over the spot where you gave your injection. Keep it there for a few seconds. Do not rub the area.  Do not put the plastic cover back on the needle. USING INSULIN PENS  Wash your hands with soap and water.  If you are using the "cloudy" insulin, roll the pen between your palms several times or rotate the pen top to bottom several times.  Remove the insulin pen cap.  Clean the rubber stopper of the cartridge with an alcohol wipe.  Remove the protective  paper tab from the disposable needle.  Screw  the needle onto the pen.  Remove the outer plastic needle cover.  Remove the inner plastic needle cover.  Prime the insulin pen by turning the button (dial) to 2 units. Hold the pen with the needle pointing up, and push the dial on the opposite end until a drop of insulin appears at the needle tip. If no insulin appears, repeat this step.  Dial the number of units of insulin you will inject.  Use an alcohol wipe to clean the area of the body to be injected.  Pinch up 1 inch of skin and hold it.  Put the needle straight into the skin (90-degree angle).  Push the dial down to push the insulin into the fat tissue.  Count to 10 slowly. Then, remove the needle from the fat tissue.  Carefully replace the larger outer plastic needle cover over the needle and unscrew the capped needle. THROWING AWAY SUPPLIES  Discard used needles in a puncture proof sharps disposal container. Follow disposal regulations for the area where you live.  Vials and empty disposable pens may be thrown away in the regular trash. Document Released: 10/25/2003 Document Revised: 12/19/2013 Document Reviewed: 01/11/2013 [facility] Patient Information 2015 [place], [name]. This information is not intended to replace advice given to you by your health care provider. Make sure you discuss any questions you have with your health care provider.  Insulin Treatment for Diabetes Diabetes is a disease that does not go away (chronic). It occurs when the body does not properly use the sugar (glucose) that is released from food after it is digested. Glucose levels are controlled by a hormone called insulin, which is made by your pancreas. Depending on the type of diabetes you have, either of the following will apply:   The pancreas does not make any insulin (type 1 diabetes).  The pancreas makes too little insulin, and the body cannot respond normally to the insulin that is made (type 2  diabetes). Without insulin, death can occur. However, with the addition of insulin, blood sugar monitoring, and treatment, someone with diabetes can live a full and productive life. This document will discuss the role of insulin in your treatment and provide information about its use.  HOW IS INSULIN GIVEN? Insulin is a medicine that can only be given by injection. Taking it by mouth makes it inactive because of the acid in your stomach. Insulin is injected under the skin by a syringe and needle, an insulin pen, a pump, or a jet injector. Your dose will be determined by your health care provider based on your individual needs. You will also be given guidance on which method of giving insulin is right for you. Remember that if you give insulin with a needle and syringe, you must do so using only a special insulin syringe made for this purpose. WHERE ON THE BODY SHOULD INSULIN BE INJECTED? Insulin is injected into the fatty layer of tissue just under your skin. Good places to inject insulin include the upper arm, the front and outer area of the thigh, the hips, and the abdomen. Giving your insulin in the abdomen is preferred because this provides the most rapid and consistent absorption. Avoid the area 2 inches (5 cm) around the navel and avoid injecting into areas on your body with scar tissue. In addition, it is important to rotate your injection sites with every shot to prevent irritation and improve absorption. WHAT ARE THE DIFFERENT TYPES OF INSULIN?  If you have type 1 diabetes, you must  take insulin to stay alive. Your body does not produce it. If you have type 2 diabetes, you might require insulin in addition to, or instead of, other medicines. In either case, proper use of insulin is critical to control your diabetes.  There are a number of different types of insulin. Usually, you will give yourself injections, though others can be trained to give them to you. Some people have an insulin pump that  delivers insulin continuously through a tube (cannula) that is placed under the skin. Using insulin requires that you check your blood sugar several times a day. The exact number of times and time of day to check will vary depending on your type of diabetes, your type of insulin, and treatment goals. Your health care provider will direct you.  Generally, different insulins have different properties. The following is a general guide. Specifics will vary by product, and new products are introduced periodically.   Rapid-acting insulin starts working quickly (in as little as 5 minutes) and wears off in 4 to 6 hours (sometimes longer). This type of insulin works well when taken just before a meal to bring your blood sugar quickly back to normal.   Short-acting insulin starts working in about 30 minutes and can last 6 to 10 hours. This type of insulin should be taken about 30 minutes before you start eating a meal.  Intermediate-acting insulin starts working in 1-2 hours and wears off after about 10 to 18 hours. This insulin will lower your blood sugar for a longer period of time, but it will not be as effective in lowering your blood sugar right after a meal.   Long-acting insulin mimics the small amount of insulin that your pancreas usually produces throughout the day. You need to have some insulin present at all times. It is crucial to the metabolism of brain cells and other cells. Long-acting insulin is meant to be used either once or twice a day. It is usually used in combination with other types of insulin, or in combination with other diabetes medicines.  Discuss the type of insulin you are taking with your health care provider or pharmacist. You will then be aware of when the insulin can be expected to peak and when it will wear off. This is important to know so you can plan for meal times and periods of exercise.  Your health care provider will usually have a strategy in mind when treating you with  insulin. This will vary with your type of diabetes, your diabetes treatment goals, and your health history. It is important that you understand this strategy so you can be an active partner in treating your diabetes. Here are some terms you might hear:   Basal insulin. This refers to the small amount of insulin that needs to be present in your blood at all times. Sometimes oral medicines will be enough. For other people, and especially for people with type 1 diabetes, insulin is needed. Usually, intermediate-acting or long-acting insulin is used once or twice a day to accomplish this.   Prandial (meal-related) insulin. Your blood sugar will rise rapidly after a meal. Rapid-acting or short-acting insulin can be used right before the meal to bring your blood sugar back to normal quickly. You might be instructed to adjust the amount of insulin depending on how much carbohydrate (starch) is in your meal.   Corrective insulin. You might be instructed to check your blood sugar at certain times of the day. You then might use  a small amount of rapid-acting or short-acting insulin to bring the blood sugar down to normal if it is elevated.   Tight control (also called intensive therapy). Tight control means keeping your blood sugar as close to your target as possible and keeping it from going too high after meals. People with tight control of their diabetes are shown to have fewer long-term problems from their diabetes.   Glycohemoglobin (also called glyco, glycosylated hemoglobin, hemoglobin A1c, or A1c) level. This measures how well your blood sugar has been controlled during the past 1 to 3 months. It helps your health care provider see how effective your treatment is and decide if any changes are needed. Your health care provider will discuss your target glycohemoglobin level with you.  Insulin treatment requires your careful attention. Treatment plans will be different for different people. Some people do  well with a simple program. Others require more complicated programs, with multiple insulin injections daily. You will work with your health care provider to develop the best program for you. Regardless of your insulin treatment plan, you must also do your best on weight control, diet and food choices, exercise, blood pressure control, cholesterol control, and stress levels.  WHAT ARE THE SIDE EFFECTS OF INSULIN? Although insulin treatment is important, it does have some side effects, such as:   Insulin can cause your blood sugar to go too low (hypoglycemia).   Weight gain can occur.   Improper injection technique can cause hypoglycemia, blood sugar to go too high (hyperglycemia), skin injury or irritation, or other problems. You must learn to inject insulin properly. Document Released: 10/31/2008 Document Revised: 12/19/2013 Document Reviewed: 01/16/2013 [facility] Patient Information 2015 [place], [name]. This information is not intended to replace advice given to  Acute Respiratory Failure Respiratory failure is when your lungs are not working well and your breathing (respiratory) system fails. When respiratory failure occurs, it is difficult for your lungs to get enough oxygen, get rid of carbon dioxide, or both. Respiratory failure can be life threatening.  Respiratory failure can be acute or chronic. Acute respiratory failure is sudden, severe, and requires emergency medical treatment. Chronic respiratory failure is less severe, happens over time, and requires ongoing treatment.  WHAT ARE THE CAUSES OF ACUTE RESPIRATORY FAILURE?  Any problem affecting the heart or lungs can cause acute respiratory failure. Some of these causes include the following:  Chronic bronchitis and emphysema (COPD).   Blood clot going to a lung (pulmonary embolism).   Having water in the lungs caused by heart failure, lung injury, or infection (pulmonary edema).   Collapsed lung (pneumothorax).   Pneumonia.    Pulmonary fibrosis.   Obesity.   Asthma.   Heart failure.   Any type of trauma to the chest that can make breathing difficult.   Nerve or muscle diseases making chest movements difficult. WHAT SYMPTOMS SHOULD YOU WATCH FOR?  If you have any of these signs or symptoms, you should seek immediate medical care:   You have shortness of breath (dyspnea) with or without activity.   You have rapid, fast breathing (tachypnea).   You are wheezing.  You are unable to say more than a few words without having to catch your breath.  You find it very difficult to function normally.  You have a fast heart rate.   You have a bluish color to your finger or toe nail beds.   You have confusion or drowsiness or both.  HOW WILL MY ACUTE RESPIRATORY FAILURE BE TREATED?  Treatment of acute respiratory failure depends on the cause of the respiratory failure. Usually, you will stay in the intensive care unit so your breathing can be watched closely. Treatment can include the following:  Oxygen. Oxygen can be delivered through the following:  Nasal cannula. This is small tubing that goes in your nose to give you oxygen.  Face mask. A face mask covers your nose and mouth to give you oxygen.  Medicine. Different medicines can be given to help with breathing. These can include:  Nebulizers. Nebulizers deliver medicines to open the air passages (bronchodilators). These medicines help to open or relax the airways in the lungs so you can breathe better. They can also help loosen mucus from your lungs.  Diuretics. Diuretic medicines can help you breathe better by getting rid of extra water in your body.  Steroids. Steroid medicines can help decrease swelling (inflammation) in your lungs.  Antibiotics.  Chest tube. If you have a collapsed lung (pneumothorax), a chest tube is placed to help reinflate the lung.  Non-invasive positive pressure ventilation (NPPV). This is a tight-fitting  mask that goes over your nose and mouth. The mask has tubing that is attached to a machine. The machine blows air into the tubing, which helps to keep the tiny air sacs (alveoli) in your lungs open. This machine allows you to breathe on your own.  Ventilator. A ventilator is a breathing machine. When on a ventilator, a breathing tube is put into the lungs. A ventilator is used when you can no longer breathe well enough on your own. You may have low oxygen levels or high carbon dioxide (CO2) levels in your blood. When you are on a ventilator, sedation and pain medicines are given to make you sleep so your lungs can heal. Document Released: 08/09/2013 Document Revised: 12/19/2013 Document Reviewed: 08/09/2013 [facility] Patient Information 2015 [place], [place]. This information is not intended to replace advice given to you by your health care provider. Make sure you discuss any questions you have with your health care provider. you by your health care provider. Make sure you discuss any questions you have with your health care provider.

## 2014-10-01 NOTE — Progress Notes (Signed)
CARE MANAGEMENT NOTE 10/01/2014  Patient:  [name], [name]   Account Number:  [id number]  Date Initiated:  10/01/2014  Documentation initiated by:  [facility]  Subjective/Objective Assessment:   asthma, breast cancer, lung cancer,     Action/Plan:   Anticipated DC Date:  10/01/2014   Anticipated DC Plan:  [place]  CM consult      [facility] Choice  HOME HEALTH   Choice offered to / List presented to:  C-1 Patient   DME arranged  [place] [facility]      DME agency  [place] arranged  HH-1 RN      [place].   Status of service:  Completed, signed off Medicare Important Message given?  YES (If response is "NO", the following Medicare IM given date fields will be blank) Date Medicare IM given:  10/01/2014 Medicare IM given by:  [facility] Date Additional Medicare IM given:   Additional Medicare IM given by:    Discharge Disposition:  [place]  Per UR Regulation:    If discussed at Long Length of Stay Meetings, dates discussed:    Comments:  10/01/2014 1200 NCM spoke to pt and offered choice for [facility]. Contacted [facility] for [facility] and DME for home. Pt wanted information on how to get a will for her young children at home. NCM explained she will have to speak to an attorney to help with getting a legal will.  Jonnie Finner RN CCM Case Mgmt phone [phone]

## 2014-10-01 NOTE — Progress Notes (Signed)
Ambulating O2 sat room air 88% -- approximately 20 feet.

## 2014-10-01 NOTE — Discharge Summary (Signed)
Physician Discharge Summary  [name] ZSW:[id number] DOB: 07-Jan-1951 DOA: 09/28/2014  PCP: [name], MD  Admit date: 09/28/2014 Discharge date: 10/01/2014   Recommendations for Outpatient Follow-Up:   1. The patient is being started on insulin therapy (hemoglobin A1c 9%, refuses oral therapy).  She will need close follow up of her glycemic control, especially as steroids are weaned. 2. Home health RN set up to assist with disease teaching/management. 3. DME set up: Nebulizer machine, home oxygen therapy. 4. PCP: Please follow up on final blood cultures and Legionella urinary antigen testing.   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  Diet recommendation: Carbohydrate-modified.     History of Present Illness:   [name] is an 64 y.o. female with a PMH of stage IV breast cancer on chemotherapy with trastuzumab / pertuzumab (last infusion 09/19/2014) under Dr. Delight Stare care, history of asthma, hypertension, left ulnar vein DVT in 2014, has completed anticoagulation with xarelto for 1 year (completed 12/2013) who presented to [facility] ED with a 2 day history of worsening shortness of breath and dry cough. On initial evaluation, blood pressure was 100/41, RR 12-25, oxygen saturation is low as 71% on room air but improved to 98% with nasal cannula oxygen support. Blood work was essentially unremarkable. Chest x-ray showed basilar atelectasis, no acute cardiopulmonary findings. CT angiogram of the chest showed no definite evidence of pulmonary embolism.   Hospital Course by Problem:    Principal Problem:  Acute on chronic respiratory failure with hypoxia / probable community-acquired pneumonia / asthma exacerbation / shortness of breath - Hypoxia likely secondary to community-acquired pneumonia with asthma exacerbation.  - Continue supplemental oxygen to maintain oxygen saturation. Continue BiPAP daily at bedtime.  Home oxygen therapy set up due to ongoing hypoxia. -Continue Levaquin x 4 more days for probable community-acquired pneumonia. Strep pneumonia negative, Legionella and blood cultures are pending at this time. - Continue nebulizer treatment, DuoNeb every 4 hours scheduled and every 4 hours as needed for shortness of breath or wheezing. - Prednisone taper at discharge, treated with Solu-Medrol while in the hospital. - Have encouraged the patient to remain in the hospital for at least another day, she is adamant about discharge today due to child care issues and feeling like she would recuperate better at home.  Will set up appropriate DME, RN support given high risk of re-hospitalization.  Active problems:  History of left ulnar vein DVT in 2014  - Patient was on anticoagulation from 2014 through 2015 (completed 12/2013 after being on xarelto for 1 year).  Stage IV breast cancer  - Last infusion with trastuzumab / pertuzumab 09/19/2014. - Continue Letrozole.  - F/U with Dr. Jana Hakim.  Diabetes mellitus without complications - Hemoglobin A1c 9%, indicating poor outpatient control with an average glucose of 212. - Diabetes coordinator consultation requested to assist with teaching, will need insulin at discharge (refused oral therapy).  -CBGs [id number], likely reflective of treatment with steroids. -Increase Lantus to 20 units daily, continue SSI/insulin resistant scale and increase meal coverage to 8 units before meals.  Essential hypertension -  Continue Norvasc 5 mg daily and Coreg 12.5 mg by mouth twice a day.  Morbid obesity - BMI 52.7. Nutrition  consulted.    Medical Consultants:    None.   Discharge Exam:   Filed Vitals:   10/01/14 0526  BP: 151/69  Pulse: 75  Temp:   Resp: 20   Filed Vitals:   09/30/14 2035 09/30/14 2129 10/01/14 0526 10/01/14 0925  BP:  142/68 151/69   Pulse: 82 67 75   Temp:  97.8 F (36.6 C)    TempSrc:  Oral Other (Comment)   Resp: 20 20 20    Height:      Weight:      SpO2: 95% 94% 95% 94%    Gen:  NAD Cardiovascular:  RRR, No M/R/G Respiratory: Lungs still with expiratory wheezes Gastrointestinal: Abdomen soft, NT/ND with normal active bowel sounds. Extremities: No C/E/C   The results of significant diagnostics from this hospitalization (including imaging, microbiology, ancillary and laboratory) are listed below for reference.     Procedures and Diagnostic Studies:   Ct Angio Chest Pe W/cm &/or Wo Cm 09/29/2014: 1. No definite evidence of central pulmonary embolus. Evaluation is significantly suboptimal due to motion artifact and artifact from the patient's habitus. 2. Bibasilar atelectasis noted; lungs otherwise essentially clear.   Dg Chest Port 1 View 09/28/2014: Low lung volumes with left basilar atelectasis.   Labs:   Basic Metabolic Panel:  Recent Labs Lab 09/28/14 1918 10/01/14 0534  NA 133* 138  K 3.9 4.5  CL 91* 98  CO2 34* 32  GLUCOSE 248* 354*  BUN 13 29*  CREATININE 0.79 0.71  CALCIUM 9.0 8.8   GFR Estimated Creatinine Clearance: 102.7 mL/min (by C-G formula based on Cr of 0.71).  Coagulation profile  Recent Labs Lab 09/29/14 0104  INR 1.13    CBC:  Recent Labs Lab 09/28/14 1918 10/01/14 0534  WBC 6.8 7.5  NEUTROABS 4.7  --   HGB 14.8 13.3  HCT 45.6 42.5  MCV 89.4 90.4  PLT 315 256   CBG:  Recent Labs Lab 09/29/14 2150 09/30/14 0751 09/30/14 1140 09/30/14 1633 09/30/14 2132  GLUCAP 306* 303* 304* 362* 278*   D-Dimer  Recent Labs  09/28/14 2242  DDIMER 0.58*   Hgb A1c  Recent Labs  09/29/14 1003  HGBA1C 9.0*     Microbiology Recent Results (from the past 240 hour(s))  MRSA PCR Screening     Status: None   Collection Time: 09/29/14  1:41 AM  Result Value Ref Range Status   MRSA by PCR NEGATIVE NEGATIVE Final    Comment:        The GeneXpert MRSA Assay (FDA approved for NASAL specimens only), is one component of a comprehensive MRSA colonization surveillance program. It is not intended to diagnose MRSA infection nor to guide or monitor treatment for MRSA infections.      Discharge Instructions:   Discharge Instructions    Call MD for:  difficulty breathing, headache or visual disturbances    Complete by:  As directed      Call MD for:  extreme fatigue    Complete by:  As directed      Call MD for:  persistant dizziness or light-headedness    Complete by:  As directed      Call MD for:  temperature >100.4    Complete by:  As directed      DME Nebulizer/meds    Complete by:  As directed  Diet Carb Modified    Complete by:  As directed      Discharge instructions    Complete by:  As directed   You were treated for pneumonia while you were in the hospital.  It is important that you see your PCP in follow up, and have him/her order a follow up chest x-ray in 4-6 weeks to ensure resolution of the pneumonia and to exclude any underlying pathology.  Take all of your antibiotics, as prescribed, even if you feel better.  Do not discontinue antibiotics prematurely.     Discharge instructions    Complete by:  As directed   It is VERY IMPORTANT that you follow up with a PCP on a regular basis.  Check your blood glucoses before each meal and at bedtime and maintain a log of your readings.  Bring this log with you when you follow up with your PCP so that he or she can adjust your insulin at your follow up visit.  If your sugars start running low, decrease the Lantus to 10 units at bedtime and the NovoLog to 6 units before each meal.     Face-to-face encounter (required for  Medicare/Medicaid patients)    Complete by:  As directed   I RAMA,CHRISTINA certify that this patient is under my care and that I, or a nurse practitioner or physician's assistant working with me, had a face-to-face encounter that meets the physician face-to-face encounter requirements with this patient on 10/01/2014. The encounter with the patient was in whole, or in part for the following medical condition(s) which is the primary reason for home health care (List medical condition): Asthma, needs skilled assessment and observation, slow to improve.  DM, now needs insulin therapy.  Needs instruction on how to self inject insulin, monitor CBGs, etc.  The encounter with the patient was in whole, or in part, for the following medical condition, which is the primary reason for home health care:  Asthma, Insulin requiring diabetes  I certify that, based on my findings, the following services are medically necessary home health services:  Nursing  Reason for Medically Necessary Home Health Services:   Skilled Nursing- Changes in Medication/Medication Management Skilled Nursing- Change/Decline in Patient Status Skilled Nursing- Skilled Assessment/Observation Skilled Nursing- Teaching of Disease Process/Symptom Management Skilled Nursing- Administration and Training of Injectable Medication    My clinical findings support the need for the above services:  Shortness of breath with activity  Further, I certify that my clinical findings support that this patient is homebound due to:  Shortness of Breath with activity     For home use only DME oxygen    Complete by:  As directed   Mode or (Route):  Nasal cannula  Frequency:  Continuous (stationary and portable oxygen unit needed)  Oxygen delivery system:  Gas     Home Health    Complete by:  As directed   To provide the following care/treatments:  RN     Increase activity slowly    Complete by:  As directed             Medication List    STOP taking  these medications        nitrofurantoin (macrocrystal-monohydrate) 100 MG capsule  Commonly known as:  MACROBID      TAKE these medications        amLODipine 5 MG tablet  Commonly known as:  NORVASC  TAKE 1 TABLET (5 MG TOTAL) BY MOUTH DAILY.  aspirin 81 MG EC tablet  TAKE 1 TABLET BY MOUTH DAILY     benzonatate 200 MG capsule  Commonly known as:  TESSALON  Take 1 capsule (200 mg total) by mouth 3 (three) times daily as needed for cough.     carvedilol 12.5 MG tablet  Commonly known as:  COREG  TAKE 1 TABLET (12.5 MG TOTAL) BY MOUTH 2 (TWO) TIMES DAILY WITH A MEAL.     dextromethorphan-guaiFENesin 30-600 MG per 12 hr tablet  Commonly known as:  MUCINEX DM  Take 1 tablet by mouth 2 (two) times daily.     HYDROcodone-homatropine 5-1.5 MG/5ML syrup  Commonly known as:  HYCODAN  Take 5 mLs by mouth every 6 (six) hours as needed for cough.     insulin aspart 100 UNIT/ML FlexPen  Commonly known as:  NOVOLOG FLEXPEN  Inject 8 Units into the skin 3 (three) times daily with meals.     Insulin Glargine 100 UNIT/ML Solostar Pen  Commonly known as:  LANTUS SOLOSTAR  Inject 20 Units into the skin daily at 10 pm. Please dispense needles, #30 per month.     ipratropium-albuterol 0.5-2.5 (3) MG/3ML Soln  Commonly known as:  DUONEB  Take 3 mLs by nebulization every 6 (six) hours.     letrozole 2.5 MG tablet  Commonly known as:  FEMARA  Take 1 tablet (2.5 mg total) by mouth daily.     levofloxacin 750 MG tablet  Commonly known as:  LEVAQUIN  Take 1 tablet (750 mg total) by mouth daily.     lidocaine-prilocaine cream  Commonly known as:  EMLA  APPLY TOPICALLY AS NEEDED.     LORazepam 0.5 MG tablet  Commonly known as:  ATIVAN  TAKE 1/2 TO 1 TABLET BY MOUTH TWICE A DAY AS NEEDED FOR ANXIETY/INSOMNIA     losartan-hydrochlorothiazide 100-12.5 MG per tablet  Commonly known as:  HYZAAR  TAKE 1 TABLET BY MOUTH DAILY.     metoCLOPramide 10 MG tablet  Commonly known as:  REGLAN   Take 1 tablet (10 mg total) by mouth every 6 (six) hours as needed for nausea.     ondansetron 8 MG tablet  Commonly known as:  ZOFRAN  Take 1 tablet (8 mg total) by mouth every 12 (twelve) hours as needed for nausea or vomiting.     predniSONE 10 MG tablet  Commonly known as:  STERAPRED UNI-PAK  Take 6 tablets on 01/31/15, then decrease by 1 tablet (10 mg) daily until off.     PROAIR HFA 108 (90 BASE) MCG/ACT inhaler  Generic drug:  albuterol  INHALE 2 PUFFS INTO THE LUNGS EVERY 6 (SIX) HOURS AS NEEDED FOR WHEEZING.     albuterol (2.5 MG/3ML) 0.083% nebulizer solution  Commonly known as:  PROVENTIL  Take 3 mLs (2.5 mg total) by nebulization every 2 (two) hours as needed (SOB and wheezing).          Time coordinating discharge: 45 minutes.  Signed:  RAMA,CHRISTINA  Pager [phone] [facility] 10/01/2014, 10:27 AM

## 2014-10-02 ENCOUNTER — Other Ambulatory Visit: Payer: Self-pay | Admitting: Oncology

## 2014-10-02 ENCOUNTER — Telehealth: Payer: Self-pay | Admitting: *Deleted

## 2014-10-02 LAB — LEGIONELLA ANTIGEN, URINE

## 2014-10-02 NOTE — Telephone Encounter (Signed)
PT. [place] ON 10/01/14. SHE DOES NOT HAVE ANY CHEMO APPOINTMENTS. NOTIFIED PT.'S SISTER THAT PT. HAS AN APPOINTMENT ON 10/10/14 FOR LAB AND SEE DR.MAGRINAT. PT. STATES DR.MAGRINAT IS HER PRIMARY CARE PHYSICIAN. CHART SHOWS DR.MARSHALL CHAMBLISS. ALSO [facility] ON [address] CALLED REQUESTING A PRESCRIPTION FOR DIABETIC PEN NEEDLES #100 TO BE USED WITH INSULIN NEEDLES. PT.'S SISTER STATES [facility] IS COMING TO SEE PT. TOMORROW.

## 2014-10-02 NOTE — Telephone Encounter (Signed)
VERBAL ORDER AND READ BACK TO DR.MAGRINAT- WILL DO A ONE TIME ORDER FOR DIABETIC PEN NEEDLES TO USE WITH INSULIN NEEDLE #100. DR.MAGRINAT WILL TALK WITH PT. AT HER 10/10/14 VISIT CONCERNING PRIMARY CARE PHYSICIAN. PT. WILL RECEIVE HER INFUSION ON 10/10/14. PRESCRIPTION FOR DIABETIC PEN NEEDLES WAS CALLED TO [facility], [place]. LEFT MESSAGE ON PT.'S HOME VOICE MAIL TO RETURN A CALL.

## 2014-10-02 NOTE — Telephone Encounter (Signed)
NO NOTE

## 2014-10-03 ENCOUNTER — Telehealth: Payer: Self-pay | Admitting: Oncology

## 2014-10-03 NOTE — Telephone Encounter (Signed)
ADDED APPTS FOR 2/24 AND 3/15. APPTS FOR 2/23 REMAIN THE SAME. S/W PT SHE IS AWARE. NOT ABLE TO SCHEDULE INF 2/23 W/LAB AND F/U DUE TO NO AVIALABILITY. PER 2/15 TX Q3W. [place] FOR 2/24 AND 3/15. LAST F/U ON SCHEDULE IS FOR 2/23. CARE PLAN ENDS 2/23.

## 2014-10-05 LAB — CULTURE, BLOOD (ROUTINE X 2)
Culture: NO GROWTH
Culture: NO GROWTH

## 2014-10-09 ENCOUNTER — Other Ambulatory Visit: Payer: Self-pay | Admitting: *Deleted

## 2014-10-09 DIAGNOSIS — C50919 Malignant neoplasm of unspecified site of unspecified female breast: Secondary | ICD-10-CM

## 2014-10-09 DIAGNOSIS — C7800 Secondary malignant neoplasm of unspecified lung: Principal | ICD-10-CM

## 2014-10-10 ENCOUNTER — Ambulatory Visit (HOSPITAL_BASED_OUTPATIENT_CLINIC_OR_DEPARTMENT_OTHER): Payer: Medicare Other | Admitting: Oncology

## 2014-10-10 ENCOUNTER — Telehealth: Payer: Self-pay | Admitting: Oncology

## 2014-10-10 ENCOUNTER — Telehealth: Payer: Self-pay | Admitting: General Practice

## 2014-10-10 ENCOUNTER — Other Ambulatory Visit (HOSPITAL_BASED_OUTPATIENT_CLINIC_OR_DEPARTMENT_OTHER): Payer: Medicare Other

## 2014-10-10 VITALS — BP 135/67 | Temp 98.7°F | Resp 18 | Ht 65.0 in | Wt 313.0 lb

## 2014-10-10 DIAGNOSIS — C50212 Malignant neoplasm of upper-inner quadrant of left female breast: Secondary | ICD-10-CM

## 2014-10-10 DIAGNOSIS — IMO0001 Reserved for inherently not codable concepts without codable children: Secondary | ICD-10-CM

## 2014-10-10 DIAGNOSIS — E1165 Type 2 diabetes mellitus with hyperglycemia: Secondary | ICD-10-CM

## 2014-10-10 DIAGNOSIS — E669 Obesity, unspecified: Secondary | ICD-10-CM

## 2014-10-10 DIAGNOSIS — G4730 Sleep apnea, unspecified: Secondary | ICD-10-CM

## 2014-10-10 DIAGNOSIS — C7800 Secondary malignant neoplasm of unspecified lung: Secondary | ICD-10-CM | POA: Diagnosis not present

## 2014-10-10 DIAGNOSIS — C50919 Malignant neoplasm of unspecified site of unspecified female breast: Secondary | ICD-10-CM

## 2014-10-10 DIAGNOSIS — Z006 Encounter for examination for normal comparison and control in clinical research program: Secondary | ICD-10-CM | POA: Diagnosis not present

## 2014-10-10 LAB — COMPREHENSIVE METABOLIC PANEL (CC13)
ALT: 80 U/L — ABNORMAL HIGH (ref 0–55)
AST: 35 U/L — ABNORMAL HIGH (ref 5–34)
Albumin: 2.8 g/dL — ABNORMAL LOW (ref 3.5–5.0)
Alkaline Phosphatase: 99 U/L (ref 40–150)
Anion Gap: 9 mEq/L (ref 3–11)
BUN: 13.8 mg/dL (ref 7.0–26.0)
CO2: 31 mEq/L — ABNORMAL HIGH (ref 22–29)
Calcium: 9.1 mg/dL (ref 8.4–10.4)
Chloride: 99 mEq/L (ref 98–109)
Creatinine: 0.8 mg/dL (ref 0.6–1.1)
EGFR: 73 mL/min/{1.73_m2} — ABNORMAL LOW (ref >90)
Glucose: 345 mg/dl — ABNORMAL HIGH (ref 70–140)
Potassium: 4.3 mEq/L (ref 3.5–5.1)
Sodium: 139 mEq/L (ref 136–145)
Total Bilirubin: 0.56 mg/dL (ref 0.20–1.20)
Total Protein: 6.3 g/dL — ABNORMAL LOW (ref 6.4–8.3)

## 2014-10-10 LAB — CBC WITH DIFFERENTIAL/PLATELET
BASO%: 1.5 % (ref 0.0–2.0)
Basophils Absolute: 0.1 10*3/uL (ref 0.0–0.1)
EOS%: 2.4 % (ref 0.0–7.0)
Eosinophils Absolute: 0.2 10*3/uL (ref 0.0–0.5)
HCT: 42.5 % (ref 34.8–46.6)
HGB: 13.5 g/dL (ref 11.6–15.9)
LYMPH%: 25 % (ref 14.0–49.7)
MCH: 27.8 pg (ref 25.1–34.0)
MCHC: 31.6 g/dL (ref 31.5–36.0)
MCV: 88 fL (ref 79.5–101.0)
MONO#: 0.5 10*3/uL (ref 0.1–0.9)
MONO%: 7.8 % (ref 0.0–14.0)
NEUT#: 4.4 10*3/uL (ref 1.5–6.5)
NEUT%: 63.3 % (ref 38.4–76.8)
Platelets: 219 10*3/uL (ref 145–400)
RBC: 4.84 10*6/uL (ref 3.70–5.45)
RDW: 15.2 % — ABNORMAL HIGH (ref 11.2–14.5)
WBC: 7 10*3/uL (ref 3.9–10.3)
lymph#: 1.7 10*3/uL (ref 0.9–3.3)
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
## 2014-10-10 NOTE — Telephone Encounter (Signed)
, °

## 2014-10-11 ENCOUNTER — Encounter: Payer: Self-pay | Admitting: Oncology

## 2014-10-11 ENCOUNTER — Ambulatory Visit (HOSPITAL_BASED_OUTPATIENT_CLINIC_OR_DEPARTMENT_OTHER): Payer: Medicare Other

## 2014-10-11 DIAGNOSIS — C50212 Malignant neoplasm of upper-inner quadrant of left female breast: Secondary | ICD-10-CM | POA: Diagnosis not present

## 2014-10-11 DIAGNOSIS — Z5112 Encounter for antineoplastic immunotherapy: Secondary | ICD-10-CM

## 2014-10-11 MED ORDER — HEPARIN SOD (PORK) LOCK FLUSH 100 UNIT/ML IV SOLN
500.0000 [IU] | Freq: Once | INTRAVENOUS | Status: AC | PRN
  Administered 2014-10-11: 500 [IU]
  Filled 2014-10-11: qty 5

## 2014-10-11 MED ORDER — TRASTUZUMAB CHEMO INJECTION 440 MG
6.0000 mg/kg | Freq: Once | INTRAVENOUS | Status: AC
  Administered 2014-10-11: 903 mg via INTRAVENOUS
  Filled 2014-10-11: qty 43

## 2014-10-11 MED ORDER — LORAZEPAM 2 MG/ML IJ SOLN
INTRAMUSCULAR | Status: AC
  Filled 2014-10-11: qty 1

## 2014-10-11 MED ORDER — DIPHENHYDRAMINE HCL 25 MG PO CAPS
ORAL_CAPSULE | ORAL | Status: AC
  Filled 2014-10-11: qty 1

## 2014-10-11 MED ORDER — PERTUZUMAB CHEMO INJECTION 420 MG/14ML
420.0000 mg | Freq: Once | INTRAVENOUS | Status: AC
  Administered 2014-10-11: 420 mg via INTRAVENOUS
  Filled 2014-10-11: qty 14

## 2014-10-11 MED ORDER — ACETAMINOPHEN 325 MG PO TABS
ORAL_TABLET | ORAL | Status: AC
  Filled 2014-10-11: qty 2

## 2014-10-11 MED ORDER — LORAZEPAM 2 MG/ML IJ SOLN
0.5000 mg | Freq: Once | INTRAMUSCULAR | Status: AC
  Administered 2014-10-11: 0.5 mg via INTRAVENOUS

## 2014-10-11 MED ORDER — ACETAMINOPHEN 325 MG PO TABS
650.0000 mg | ORAL_TABLET | Freq: Once | ORAL | Status: AC
  Administered 2014-10-11: 650 mg via ORAL

## 2014-10-11 MED ORDER — DIPHENHYDRAMINE HCL 25 MG PO CAPS
25.0000 mg | ORAL_CAPSULE | Freq: Once | ORAL | Status: AC
  Administered 2014-10-11: 25 mg via ORAL

## 2014-10-11 MED ORDER — SODIUM CHLORIDE 0.9 % IV SOLN
Freq: Once | INTRAVENOUS | Status: AC
  Administered 2014-10-11: 14:00:00 via INTRAVENOUS

## 2014-10-11 MED ORDER — SODIUM CHLORIDE 0.9 % IJ SOLN
10.0000 mL | INTRAMUSCULAR | Status: DC | PRN
  Administered 2014-10-11: 10 mL
  Filled 2014-10-11: qty 10

## 2014-10-11 NOTE — Patient Instructions (Signed)
[facility] Discharge Instructions for Patients Receiving Chemotherapy  Today you received the following chemotherapy agents: Herceptin and Perjeta   To help prevent nausea and vomiting after your treatment, we encourage you to take your nausea medication as prescribed.    If you develop nausea and vomiting that is not controlled by your nausea medication, call the clinic.   BELOW ARE SYMPTOMS THAT SHOULD BE REPORTED IMMEDIATELY:  *FEVER GREATER THAN 100.5 F  *CHILLS WITH OR WITHOUT FEVER  NAUSEA AND VOMITING THAT IS NOT CONTROLLED WITH YOUR NAUSEA MEDICATION  *UNUSUAL SHORTNESS OF BREATH  *UNUSUAL BRUISING OR BLEEDING  TENDERNESS IN MOUTH AND THROAT WITH OR WITHOUT PRESENCE OF ULCERS  *URINARY PROBLEMS  *BOWEL PROBLEMS  UNUSUAL RASH Items with * indicate a potential emergency and should be followed up as soon as possible.  Feel free to call the clinic you have any questions or concerns. The clinic phone number is [phone].

## 2014-10-11 NOTE — Progress Notes (Signed)
10/11/14 - 37 ePRO - Patient into the cancer center for treatment today.  I saw the patient in the infusion room and gave her the ePRO's to complete. She competed them.  I thanked the patient for her continued support in this clinical trial. Remer Macho 10/01/14 - 2:15 pm

## 2014-10-11 NOTE — Progress Notes (Signed)
Patient deaccessed by other RN after Perjeta to go to bathroom and remained in infusion room x15 more minutes to total 30 minutes post infusion. Discharged home via wheelchair in stable condition.

## 2014-10-12 ENCOUNTER — Telehealth: Payer: Self-pay | Admitting: *Deleted

## 2014-10-12 NOTE — Telephone Encounter (Signed)
Call received that Herceptin injection is approved for this patient.  Call transferred to [facility].  [phone].

## 2014-10-13 ENCOUNTER — Telehealth: Payer: Self-pay | Admitting: Family Medicine

## 2014-10-13 NOTE — Telephone Encounter (Signed)
Will forward to MD. Jazmin Hartsell,CMA  

## 2014-10-13 NOTE — Telephone Encounter (Signed)
Pt recently discharged from the hospital, was given orders for copd education and management, requesting to add diabetes education as well. Also, needs orders for PT and OT, pt is scheduled for a hosp f/u appt on Monday.

## 2014-10-16 ENCOUNTER — Encounter: Payer: Self-pay | Admitting: Family Medicine

## 2014-10-16 ENCOUNTER — Ambulatory Visit (INDEPENDENT_AMBULATORY_CARE_PROVIDER_SITE_OTHER): Payer: Medicare Other | Admitting: Family Medicine

## 2014-10-16 VITALS — BP 173/82 | HR 86 | Temp 98.6°F | Resp 24 | Wt 373.0 lb

## 2014-10-16 DIAGNOSIS — E1142 Type 2 diabetes mellitus with diabetic polyneuropathy: Secondary | ICD-10-CM

## 2014-10-16 DIAGNOSIS — I10 Essential (primary) hypertension: Secondary | ICD-10-CM

## 2014-10-16 DIAGNOSIS — E1140 Type 2 diabetes mellitus with diabetic neuropathy, unspecified: Secondary | ICD-10-CM | POA: Insufficient documentation

## 2014-10-16 DIAGNOSIS — B351 Tinea unguium: Secondary | ICD-10-CM

## 2014-10-16 DIAGNOSIS — IMO0001 Reserved for inherently not codable concepts without codable children: Secondary | ICD-10-CM

## 2014-10-16 DIAGNOSIS — R0602 Shortness of breath: Secondary | ICD-10-CM

## 2014-10-16 DIAGNOSIS — E1165 Type 2 diabetes mellitus with hyperglycemia: Secondary | ICD-10-CM

## 2014-10-16 MED ORDER — NORTRIPTYLINE HCL 25 MG PO CAPS: 25.0000 mg | ORAL_CAPSULE | Freq: Every day | ORAL | Status: DC

## 2014-10-16 NOTE — Assessment & Plan Note (Signed)
Found to have A1c of 9.0 while admitted recently, did not want to take any medications Has started insulin and can describe her regimen easily Refuses to go to diabetic occasional class, but seems very uneasy about diabetes and general as well as insulin treatment so I will ask her to follow-up in her pharmacy clinic Increase Lantus to 24 units daily, NovoLog to 10 units per meal She may have less control of her sugars currently due to recent steroids, however these of been gone for several days now. Follow-up with PCP next month

## 2014-10-16 NOTE — Patient Instructions (Addendum)
Great to meet you!  Please come back to see Dr. Erin Hearing in 3-4 weeks  Please make an appointment with our pharmacists to discuss your insulin and diabetes  Consider the diabetic class  Start nortriptyline 1 pill a night at bedtime, You can discuss increasing this with your PCP next time you come in.  Increase your lantus to 24 units a night Increase your novolog to 10 units per meal.   Diet Recommendations for Diabetes   Starchy (carb) foods include: Bread, rice, pasta, potatoes, corn, crackers, bagels, muffins, all baked goods.   Protein foods include: Meat, fish, poultry, eggs, dairy foods, and beans such as pinto and kidney beans (beans also provide carbohydrate).   1. Eat at least 3 meals and 1-2 snacks per day. Never go more than 4-5 hours while awake without eating.  2. Limit starchy foods to TWO per meal and ONE per snack. ONE portion of a starchy  food is equal to the following:   - ONE slice of bread (or its equivalent, such as half of a hamburger bun).   - 1/2 cup of a "scoopable" starchy food such as potatoes or rice.   - 1 OUNCE (28 grams) of starchy snack foods such as crackers or pretzels (look on label).   - 15 grams of carbohydrate as shown on food label.  3. Both lunch and dinner should include a protein food, a carb food, and vegetables.   - Obtain twice as many veg's as protein or carbohydrate foods for both lunch and dinner.   - Try to keep frozen veg's on hand for a quick vegetable serving.     - Fresh or frozen veg's are best.  4. Breakfast should always include protein.

## 2014-10-16 NOTE — Assessment & Plan Note (Signed)
Elevated today, normalized by the end of our visit Continue amlodipine, Coreg, losartan, hydrochlorothiazide Follow-up with PCP in one month.

## 2014-10-16 NOTE — Progress Notes (Signed)
Patient ID: [name], female   DOB: Sep 19, 1950, 64 y.o.   MRN: [id number]   HPI  Patient presents today for hospital follow-up  She was admitted the hospital in February 11 for dyspnea as ultimate treated as CAP with reactive airway disease. She had a CT angiogram of her chest which ruled out pulmonary embolus. She was also found to have elevated A1c started on insulin during this hospitalization due to not wanting to use oral medications for diabetes.  Pneumonia States breathings improving, has some underlying chronic lung disease which is unclear but now requiring 3 L of oxygen via nasal cannula. Describes continued exertional dyspnea and states that due to her bilateral leg pain and knee pain her mobility is limited even more than usual.  Diabetes Taking 20 units of Lantus every night, 8 units of NovoLog 2-3 times daily with meals Fasting blood sugars range 125-288, postprandial sugars range from 200-569, mostly in the 2-300 range. She is not watching her diet She is limited in exercise due to her pain and dyspnea She does complain of bilateral stocking-like distribution numbness and tingling type pain of her feet  Foot rash Describes intermittent right foot rash which is itchy and responds some to antifungal creams   Smoking status noted ROS: Per HPI  Objective: BP 173/82 mmHg  Pulse 86  Temp(Src) 98.6 F (37 C) (Oral)  Resp 24  Wt 373 lb (169.192 kg)  SpO2 97% Gen: NAD, alert, cooperative with exam HEENT: NCAT CV: RRR, good S1/S2, no murmur Resp: CTABL, no wheezes, non-labored Neuro: Alert and oriented,  sitting in wheelchair Diabetic foot exam: 2+ DP pulses bilaterally, sensation intact bilaterally, no lesions, right great toe with thickened yellow appearance, moderate scaling and intermittent erythema between toes on the right foot  Assessment and plan:  Uncontrolled diabetes mellitus without complication Found to have A1c of 9.0 while admitted recently, did not  want to take any medications Has started insulin and can describe her regimen easily Refuses to go to diabetic occasional class, but seems very uneasy about diabetes and general as well as insulin treatment so I will ask her to follow-up in her pharmacy clinic Increase Lantus to 24 units daily, NovoLog to 10 units per meal She may have less control of her sugars currently due to recent steroids, however these of been gone for several days now. Follow-up with PCP next month    SOB (shortness of breath) Exertional dyspnea reported, recent admission for CAP Unclear chronic respiratory disease, states she has a history of asthma, she is a never smoker Has follow-up being arranged with pulmonology, also had an echocardiogram arranged. Now on 3 L O2 via nasal cannula, continue Wheelchair ordered today   HYPERTENSION, BENIGN SYSTEMIC Elevated today, normalized by the end of our visit Continue amlodipine, Coreg, losartan, hydrochlorothiazide Follow-up with PCP in one month.   Diabetic neuropathy Symptoms consistent with diabetic neuropathy Foot exam with onychomycosis on right great toe, recommended topical Lamisil for this Start nortriptyline for neuropathy, follow-up with PCP    Onychomycosis Right foot with onychomycosis, patient describes intermittent flares of athlete's foot as well Some improvement with topical antifungals, she would definitely warrant oral Lamisil For now I will defer Lamisil given her slight transaminitis during her admission, however this may be a nidus for infection in the future and we may need to consider carefully treating her with oral Lamisil to eradicate this fungal infection.      Orders Placed This Encounter  Procedures  .  DME Wheelchair manual    Patient suffers from dyspnea and knee pain which impairs their ability to perform daily activities like bathing in the home.  A walker will not resolve  issue with performing activities of daily living.  A wheelchair will allow patient to safely perform daily activities. Patient can safely propel the wheelchair in the home or has a caregiver who can provide assistance.  Accessories: elevating leg rests (ELRs), wheel locks, extensions and anti-tippers.    Meds ordered this encounter  Medications  . nortriptyline (PAMELOR) 25 MG capsule    Sig: Take 1 capsule (25 mg total) by mouth at bedtime.    Dispense:  30 capsule    Refill:  1

## 2014-10-16 NOTE — Assessment & Plan Note (Signed)
Symptoms consistent with diabetic neuropathy Foot exam with onychomycosis on right great toe, recommended topical Lamisil for this Start nortriptyline for neuropathy, follow-up with PCP

## 2014-10-16 NOTE — Assessment & Plan Note (Signed)
Exertional dyspnea reported, recent admission for CAP Unclear chronic respiratory disease, states she has a history of asthma, she is a never smoker Has follow-up being arranged with pulmonology, also had an echocardiogram arranged. Now on 3 L O2 via nasal cannula, continue Wheelchair ordered today

## 2014-10-16 NOTE — Assessment & Plan Note (Signed)
Right foot with onychomycosis, patient describes intermittent flares of athlete's foot as well Some improvement with topical antifungals, she would definitely warrant oral Lamisil For now I will defer Lamisil given her slight transaminitis during her admission, however this may be a nidus for infection in the future and we may need to consider carefully treating her with oral Lamisil to eradicate this fungal infection.

## 2014-10-16 NOTE — Telephone Encounter (Signed)
Barbaraann Rondo with [facility] calling regarding orders that they received requesting wheelchair for patient.  Need further info regarding.  Such as : patient unable to use walker, cane or crutches.  And that patient is to use wheelchair while in the home, safely propel chair themselves or have assistance.  Can be entered in the comment line under order for wheelchair.  Please contact Barbaraann Rondo if any further issues regarding this at [phone] ext [phone].

## 2014-10-17 ENCOUNTER — Telehealth: Payer: Self-pay | Admitting: Family Medicine

## 2014-10-17 DIAGNOSIS — M48061 Spinal stenosis, lumbar region without neurogenic claudication: Secondary | ICD-10-CM

## 2014-10-17 DIAGNOSIS — M479 Spondylosis, unspecified: Secondary | ICD-10-CM

## 2014-10-17 DIAGNOSIS — R0602 Shortness of breath: Secondary | ICD-10-CM

## 2014-10-17 NOTE — Telephone Encounter (Signed)
Jazmin Curious why this is coming to me? I do not think I have ever seen her--Dr Erin Hearing is PCP and Dr. Wendi Snipes evident;ly saw her yesterday. ?? [name]

## 2014-10-17 NOTE — Telephone Encounter (Signed)
Unsure why this message went to Dr. Nori Riis.  Will forward to Dr. Wendi Snipes and Dr. Erin Hearing. Jazmin Hartsell,CMA

## 2014-10-17 NOTE — Telephone Encounter (Signed)
Pt called because she seen Dr. Wendi Snipes yesterday and he was suppose to call in some cream. Can we call this in today. jw

## 2014-10-17 NOTE — Telephone Encounter (Signed)
Cora the home health care nurse called and is requesting verbals order for PT, OT DM, Pill Box. Please call her at [phone] if you have any questions. jw

## 2014-10-17 NOTE — Telephone Encounter (Signed)
Called and wrote clarifying order, will fax.   Laroy Apple, MD [place] Resident, PGY-3 10/17/2014, 1:48 PM

## 2014-10-17 NOTE — Telephone Encounter (Signed)
I recommended lamisil cream OTC. Will ask nursing to inform.   Laroy Apple, MD [place] Resident, PGY-3 10/17/2014, 12:23 PM

## 2014-10-17 NOTE — Progress Notes (Signed)
Agree 

## 2014-10-20 ENCOUNTER — Telehealth: Payer: Self-pay | Admitting: Family Medicine

## 2014-10-20 NOTE — Telephone Encounter (Signed)
Cora from [facility] called and she is needing verbal orders to continue seeing the patient 1 time a week for 5 weeks. She is also requesting orders for a Education officer, museum. Also she would like the patient to have a new glucometer and supplies that go with this. The only issue is to make sure that her insurance will cover this. Please call Mel Almond with any questions [phone]. jw

## 2014-10-20 NOTE — Telephone Encounter (Signed)
Will forward to PCP for review. Please see below and advise. Carollee Nussbaumer, CMA.

## 2014-10-21 ENCOUNTER — Other Ambulatory Visit: Payer: Self-pay | Admitting: Family Medicine

## 2014-10-23 ENCOUNTER — Telehealth: Payer: Self-pay | Admitting: Family Medicine

## 2014-10-23 NOTE — Telephone Encounter (Signed)
Pt called to check on status of supplies for her diabetes and was wondering when she could expect to hear something / sr

## 2014-10-23 NOTE — Telephone Encounter (Signed)
Pt called and needs a refill on her One Touch Ultra strips. jw

## 2014-10-23 NOTE — Telephone Encounter (Signed)
Pt informed. Deseree Blount, CMA  

## 2014-10-23 NOTE — Telephone Encounter (Signed)
Will forward to MD. Jazmin Hartsell,CMA  

## 2014-10-24 MED ORDER — GLUCOSE BLOOD VI STRP: ORAL_STRIP | Status: DC

## 2014-10-24 NOTE — Telephone Encounter (Signed)
Put in Rx for test strips today Does she need other supplies if so - What exactly - name and model - does she need Thanks

## 2014-10-24 NOTE — Telephone Encounter (Signed)
Pt uses the one touch ultra.  Levante Simones,CMA

## 2014-10-25 ENCOUNTER — Telehealth: Payer: Self-pay | Admitting: Family Medicine

## 2014-10-25 NOTE — Telephone Encounter (Signed)
Will forward to PCP for orders. These are the other orders Ashok Cordia are requesting from 10/20/14 in addition to orders requested today for pt. I know you already sent in DM supplies. Please advise as she is requesting field nursing visits, social work consult and needs a foot cream Rx. Caeleigh Prohaska,CMA  This is the first message from 10/20/14 below: Cora from [facility] called and she is needing verbal orders to continue seeing the patient 1 time a week for 5 weeks. She is also requesting orders for a Education officer, museum. Also she would like the patient to have a new glucometer and supplies that go with this. The only issue is to make sure that her insurance will cover this. Please call Mel Almond with any questions [phone]

## 2014-10-25 NOTE — Telephone Encounter (Signed)
[facility]ah from [facility] called and said that previous orders that were called in have not yet been faxed, and if they were she never received them and the fax # [phone] in addition to previous order she needs a foot cream rx, social work consult, 1 Glucometer & strips, and field nursing visits. Please contact Korah @ [phone] when ready or for more info. / thanks sr

## 2014-10-26 NOTE — Telephone Encounter (Signed)
Please give her orders for field nursing visits, social work consult   I will address foot cream Rx at next visit  Thanks  [place]

## 2014-10-26 NOTE — Telephone Encounter (Signed)
FYI: Tito Dine as directed below and verbalized understanding. She stated that pt told her she cannot check her BS 4x daily as this is too expensive for her. I also looked at telephone message from 10/23/14 about foot cream and Dr. Wendi Snipes suggested pt use Lamisil cream and pt was advised of this the same day (not sure if ptdid not remember this). I called Korah back and left message to inform her that pt was told to use OTC Lamisil cream.  Jimia Gentles, CMA.

## 2014-10-26 NOTE — Telephone Encounter (Signed)
Ashok Cordia called again. She needs the verbal orders so the pt can be seen this week [phone]

## 2014-10-30 ENCOUNTER — Other Ambulatory Visit: Payer: Self-pay | Admitting: Emergency Medicine

## 2014-10-30 ENCOUNTER — Telehealth: Payer: Self-pay | Admitting: Family Medicine

## 2014-10-30 DIAGNOSIS — C7800 Secondary malignant neoplasm of unspecified lung: Principal | ICD-10-CM

## 2014-10-30 DIAGNOSIS — C50919 Malignant neoplasm of unspecified site of unspecified female breast: Secondary | ICD-10-CM

## 2014-10-30 NOTE — Telephone Encounter (Signed)
[facility] is waiting on questions sent over to the PCP (sent twice) to be answered before releasing wheelchair to pt / thanks sr

## 2014-10-30 NOTE — Telephone Encounter (Signed)
Will forward to MD to see if this order was received. Jazmin Hartsell,CMA

## 2014-10-31 ENCOUNTER — Other Ambulatory Visit (HOSPITAL_BASED_OUTPATIENT_CLINIC_OR_DEPARTMENT_OTHER): Payer: Medicare Other

## 2014-10-31 ENCOUNTER — Ambulatory Visit (HOSPITAL_BASED_OUTPATIENT_CLINIC_OR_DEPARTMENT_OTHER): Payer: Medicare Other

## 2014-10-31 DIAGNOSIS — C7802 Secondary malignant neoplasm of left lung: Secondary | ICD-10-CM | POA: Diagnosis not present

## 2014-10-31 DIAGNOSIS — C50212 Malignant neoplasm of upper-inner quadrant of left female breast: Secondary | ICD-10-CM

## 2014-10-31 DIAGNOSIS — Z112 Encounter for screening for other bacterial diseases: Secondary | ICD-10-CM

## 2014-10-31 DIAGNOSIS — C7800 Secondary malignant neoplasm of unspecified lung: Principal | ICD-10-CM

## 2014-10-31 DIAGNOSIS — C50919 Malignant neoplasm of unspecified site of unspecified female breast: Secondary | ICD-10-CM

## 2014-10-31 LAB — COMPREHENSIVE METABOLIC PANEL (CC13)
ALT: 76 U/L — ABNORMAL HIGH (ref 0–55)
AST: 33 U/L (ref 5–34)
Albumin: 3 g/dL — ABNORMAL LOW (ref 3.5–5.0)
Alkaline Phosphatase: 120 U/L (ref 40–150)
Anion Gap: 11 mEq/L (ref 3–11)
BUN: 12.3 mg/dL (ref 7.0–26.0)
CO2: 32 mEq/L — ABNORMAL HIGH (ref 22–29)
Calcium: 9.5 mg/dL (ref 8.4–10.4)
Chloride: 98 mEq/L (ref 98–109)
Creatinine: 0.7 mg/dL (ref 0.6–1.1)
EGFR: 87 mL/min/{1.73_m2} — ABNORMAL LOW (ref >90)
Glucose: 192 mg/dl — ABNORMAL HIGH (ref 70–140)
Potassium: 4 mEq/L (ref 3.5–5.1)
Sodium: 142 mEq/L (ref 136–145)
Total Bilirubin: 0.31 mg/dL (ref 0.20–1.20)
Total Protein: 6.8 g/dL (ref 6.4–8.3)

## 2014-10-31 LAB — CBC WITH DIFFERENTIAL/PLATELET
BASO%: 0.4 % (ref 0.0–2.0)
Basophils Absolute: 0 10*3/uL (ref 0.0–0.1)
EOS%: 1.6 % (ref 0.0–7.0)
Eosinophils Absolute: 0.1 10*3/uL (ref 0.0–0.5)
HCT: 41.3 % (ref 34.8–46.6)
HGB: 13.3 g/dL (ref 11.6–15.9)
LYMPH%: 34 % (ref 14.0–49.7)
MCH: 27.7 pg (ref 25.1–34.0)
MCHC: 32.2 g/dL (ref 31.5–36.0)
MCV: 86 fL (ref 79.5–101.0)
MONO#: 0.4 10*3/uL (ref 0.1–0.9)
MONO%: 6.3 % (ref 0.0–14.0)
NEUT#: 3.7 10*3/uL (ref 1.5–6.5)
NEUT%: 57.7 % (ref 38.4–76.8)
Platelets: 264 10*3/uL (ref 145–400)
RBC: 4.8 10*6/uL (ref 3.70–5.45)
RDW: 14.7 % — ABNORMAL HIGH (ref 11.2–14.5)
WBC: 6.5 10*3/uL (ref 3.9–10.3)
lymph#: 2.2 10*3/uL (ref 0.9–3.3)

## 2014-10-31 MED ORDER — LORAZEPAM 2 MG/ML IJ SOLN
0.5000 mg | Freq: Once | INTRAMUSCULAR | Status: AC
  Administered 2014-10-31: 0.5 mg via INTRAVENOUS

## 2014-10-31 MED ORDER — DIPHENHYDRAMINE HCL 25 MG PO CAPS
25.0000 mg | ORAL_CAPSULE | Freq: Once | ORAL | Status: AC
  Administered 2014-10-31: 25 mg via ORAL

## 2014-10-31 MED ORDER — TRASTUZUMAB CHEMO INJECTION 440 MG
6.0000 mg/kg | Freq: Once | INTRAVENOUS | Status: AC
  Administered 2014-10-31: 903 mg via INTRAVENOUS
  Filled 2014-10-31: qty 43

## 2014-10-31 MED ORDER — ACETAMINOPHEN 325 MG PO TABS
650.0000 mg | ORAL_TABLET | Freq: Once | ORAL | Status: AC
  Administered 2014-10-31: 650 mg via ORAL

## 2014-10-31 MED ORDER — ACETAMINOPHEN 325 MG PO TABS
ORAL_TABLET | ORAL | Status: AC
  Filled 2014-10-31: qty 2

## 2014-10-31 MED ORDER — HEPARIN SOD (PORK) LOCK FLUSH 100 UNIT/ML IV SOLN
500.0000 [IU] | Freq: Once | INTRAVENOUS | Status: AC | PRN
  Administered 2014-10-31: 500 [IU]
  Filled 2014-10-31: qty 5

## 2014-10-31 MED ORDER — SODIUM CHLORIDE 0.9 % IJ SOLN
10.0000 mL | INTRAMUSCULAR | Status: DC | PRN
  Administered 2014-10-31: 10 mL
  Filled 2014-10-31: qty 10

## 2014-10-31 MED ORDER — DIPHENHYDRAMINE HCL 25 MG PO CAPS
ORAL_CAPSULE | ORAL | Status: AC
  Filled 2014-10-31: qty 1

## 2014-10-31 MED ORDER — SODIUM CHLORIDE 0.9 % IV SOLN
420.0000 mg | Freq: Once | INTRAVENOUS | Status: AC
  Administered 2014-10-31: 420 mg via INTRAVENOUS
  Filled 2014-10-31: qty 14

## 2014-10-31 MED ORDER — LORAZEPAM 2 MG/ML IJ SOLN
INTRAMUSCULAR | Status: AC
  Filled 2014-10-31: qty 1

## 2014-10-31 MED ORDER — SODIUM CHLORIDE 0.9 % IV SOLN
Freq: Once | INTRAVENOUS | Status: AC
Start: 2014-10-31 — End: 2014-10-31
  Administered 2014-10-31: 14:00:00 via INTRAVENOUS

## 2014-10-31 NOTE — Telephone Encounter (Signed)
[facility] is [phone].  Will send this message to Dr. Wendi Snipes to see if he has received anything. Percell Lamboy,CMA

## 2014-10-31 NOTE — Telephone Encounter (Signed)
I have not seen any paperwork. On 3/1 I did call and talk to someone and then sent a new DME rx with specified criteria. I called and could not wait any longer after 15 min on hold.   Laroy Apple, MD [place] Resident, PGY-3 10/31/2014, 1:56 PM

## 2014-10-31 NOTE — Patient Instructions (Addendum)
[facility] [facility] Discharge Instructions for Patients Receiving Chemotherapy  Today you received the following chemotherapy agents herceptin/perjeta   BELOW ARE SYMPTOMS THAT SHOULD BE REPORTED IMMEDIATELY:  *FEVER GREATER THAN 100.5 F  *CHILLS WITH OR WITHOUT FEVER  NAUSEA AND VOMITING THAT IS NOT CONTROLLED WITH YOUR NAUSEA MEDICATION  *UNUSUAL SHORTNESS OF BREATH  *UNUSUAL BRUISING OR BLEEDING  TENDERNESS IN MOUTH AND THROAT WITH OR WITHOUT PRESENCE OF ULCERS  *URINARY PROBLEMS  *BOWEL PROBLEMS  UNUSUAL RASH Items with * indicate a potential emergency and should be followed up as soon as possible.  Feel free to call the clinic you have any questions or concerns. The clinic phone number is [phone].

## 2014-10-31 NOTE — Telephone Encounter (Signed)
Not in my box perhaps Dr Wendi Snipes receieved them?  He is out today  If they have a number I will call them to discuss

## 2014-11-08 ENCOUNTER — Encounter: Payer: Self-pay | Admitting: Family Medicine

## 2014-11-08 ENCOUNTER — Ambulatory Visit (INDEPENDENT_AMBULATORY_CARE_PROVIDER_SITE_OTHER): Payer: Medicare Other | Admitting: Family Medicine

## 2014-11-08 VITALS — BP 146/87 | HR 74 | Temp 98.5°F | Ht 64.0 in | Wt 310.6 lb

## 2014-11-08 DIAGNOSIS — IMO0001 Reserved for inherently not codable concepts without codable children: Secondary | ICD-10-CM

## 2014-11-08 DIAGNOSIS — F411 Generalized anxiety disorder: Secondary | ICD-10-CM | POA: Diagnosis not present

## 2014-11-08 DIAGNOSIS — M47895 Other spondylosis, thoracolumbar region: Secondary | ICD-10-CM

## 2014-11-08 DIAGNOSIS — E1165 Type 2 diabetes mellitus with hyperglycemia: Secondary | ICD-10-CM

## 2014-11-08 NOTE — Assessment & Plan Note (Signed)
Chronic problem.  Will need to review her medications.  Goal is to not have chronic narcotics and to not be wc dependent

## 2014-11-08 NOTE — Assessment & Plan Note (Signed)
Chronic issue.  Need to review her medications.  Relatively stable currently

## 2014-11-08 NOTE — Assessment & Plan Note (Signed)
Control seems to be improving. Slight increase in lantus and monitor

## 2014-11-08 NOTE — Progress Notes (Signed)
   Subjective:    Patient ID: [name], female    DOB: September 25, 1950, 64 y.o.   MRN: [id number]  HPI  DIABETES Disease Monitoring: Blood Sugar ranges-log shows improvement.  Last fasting was 190 Polyuria/phagia/dipsia- no      Visual problems- no Medications: Compliance- knows medications and dosages Hypoglycemic symptoms- no  Back Pain Keeps her from exercising and moving.  Did not bring in medications.  Not clear exactly what she is taking.  Would like narcotics.  No new focal weakness or incontinence  Anxiety No clear what she is taking.  Sometimes feel anxious.  Relatively at peace with her cancer diagnosis.  No suicidal ideation   Chief Complaint noted Review of Symptoms - see HPI PMH - Smoking status noted.   Vital Signs reviewed    Review of Systems     Objective:   Physical Exam Alert talkative Heart - Regular rate and rhythm.  No murmurs, gallops or rubs.    Lungs:  Normal respiratory effort, chest expands symmetrically. Lungs are clear to auscultation, no crackles or wheezes.  Breath sounds are distant  Lower extrem - strenght 4/5 bilaterally Did not try to have her walk       Assessment & Plan:

## 2014-11-08 NOTE — Patient Instructions (Addendum)
Good to see you today!  Thanks for coming in.  For the diabetes increase your lantus to 26 u daily  BRING ALL YOUR MEDICATIONS BOTTLES NEXT VISIT and your diabetes readings   See your eye doctor for a diabetes eye check  Come back in 3 weeks

## 2014-11-14 ENCOUNTER — Ambulatory Visit (HOSPITAL_COMMUNITY): Payer: Medicare Other

## 2014-11-20 ENCOUNTER — Other Ambulatory Visit: Payer: Self-pay | Admitting: *Deleted

## 2014-11-20 ENCOUNTER — Ambulatory Visit (HOSPITAL_COMMUNITY)
Admission: RE | Admit: 2014-11-20 | Discharge: 2014-11-20 | Disposition: A | Discharge status: Home / Self Care | Payer: Medicare Other | Source: Ambulatory Visit | Attending: Oncology | Admitting: Oncology

## 2014-11-20 DIAGNOSIS — E1165 Type 2 diabetes mellitus with hyperglycemia: Secondary | ICD-10-CM | POA: Insufficient documentation

## 2014-11-20 DIAGNOSIS — G4730 Sleep apnea, unspecified: Secondary | ICD-10-CM | POA: Diagnosis not present

## 2014-11-20 DIAGNOSIS — Z5111 Encounter for antineoplastic chemotherapy: Secondary | ICD-10-CM | POA: Diagnosis not present

## 2014-11-20 DIAGNOSIS — C7800 Secondary malignant neoplasm of unspecified lung: Secondary | ICD-10-CM | POA: Insufficient documentation

## 2014-11-20 DIAGNOSIS — IMO0001 Reserved for inherently not codable concepts without codable children: Secondary | ICD-10-CM

## 2014-11-20 DIAGNOSIS — C50919 Malignant neoplasm of unspecified site of unspecified female breast: Secondary | ICD-10-CM

## 2014-11-20 DIAGNOSIS — C50212 Malignant neoplasm of upper-inner quadrant of left female breast: Secondary | ICD-10-CM

## 2014-11-20 MED ORDER — PERFLUTREN LIPID MICROSPHERE
1.0000 mL | INTRAVENOUS | Status: AC | PRN
  Filled 2014-11-20: qty 10

## 2014-11-20 NOTE — Progress Notes (Signed)
Echocardiogram 2D Echocardiogram has been performed.  [name] 11/20/2014, 11:25 AM

## 2014-11-21 ENCOUNTER — Ambulatory Visit (HOSPITAL_BASED_OUTPATIENT_CLINIC_OR_DEPARTMENT_OTHER): Payer: Medicare Other

## 2014-11-21 ENCOUNTER — Other Ambulatory Visit (HOSPITAL_BASED_OUTPATIENT_CLINIC_OR_DEPARTMENT_OTHER): Payer: Medicare Other

## 2014-11-21 VITALS — BP 144/81 | HR 70 | Temp 98.2°F | Resp 18

## 2014-11-21 DIAGNOSIS — C7800 Secondary malignant neoplasm of unspecified lung: Secondary | ICD-10-CM

## 2014-11-21 DIAGNOSIS — C50212 Malignant neoplasm of upper-inner quadrant of left female breast: Secondary | ICD-10-CM | POA: Diagnosis not present

## 2014-11-21 DIAGNOSIS — C50912 Malignant neoplasm of unspecified site of left female breast: Secondary | ICD-10-CM

## 2014-11-21 DIAGNOSIS — Z5112 Encounter for antineoplastic immunotherapy: Secondary | ICD-10-CM

## 2014-11-21 LAB — COMPREHENSIVE METABOLIC PANEL (CC13)
ALT: 83 U/L — ABNORMAL HIGH (ref 0–55)
AST: 48 U/L — ABNORMAL HIGH (ref 5–34)
Albumin: 3.2 g/dL — ABNORMAL LOW (ref 3.5–5.0)
Alkaline Phosphatase: 134 U/L (ref 40–150)
Anion Gap: 10 mEq/L (ref 3–11)
BUN: 17.6 mg/dL (ref 7.0–26.0)
CO2: 28 mEq/L (ref 22–29)
Calcium: 9.8 mg/dL (ref 8.4–10.4)
Chloride: 105 mEq/L (ref 98–109)
Creatinine: 0.8 mg/dL (ref 0.6–1.1)
EGFR: 79 mL/min/{1.73_m2} — ABNORMAL LOW (ref >90)
Glucose: 161 mg/dl — ABNORMAL HIGH (ref 70–140)
Potassium: 4.4 mEq/L (ref 3.5–5.1)
Sodium: 143 mEq/L (ref 136–145)
Total Bilirubin: 0.32 mg/dL (ref 0.20–1.20)
Total Protein: 7.3 g/dL (ref 6.4–8.3)

## 2014-11-21 LAB — CBC WITH DIFFERENTIAL/PLATELET
BASO%: 0.3 % (ref 0.0–2.0)
Basophils Absolute: 0 10*3/uL (ref 0.0–0.1)
EOS%: 2.1 % (ref 0.0–7.0)
Eosinophils Absolute: 0.1 10*3/uL (ref 0.0–0.5)
HCT: 43.4 % (ref 34.8–46.6)
HGB: 14.3 g/dL (ref 11.6–15.9)
LYMPH%: 36.4 % (ref 14.0–49.7)
MCH: 28.9 pg (ref 25.1–34.0)
MCHC: 32.9 g/dL (ref 31.5–36.0)
MCV: 87.9 fL (ref 79.5–101.0)
MONO#: 0.4 10*3/uL (ref 0.1–0.9)
MONO%: 6.3 % (ref 0.0–14.0)
NEUT#: 3.7 10*3/uL (ref 1.5–6.5)
NEUT%: 54.9 % (ref 38.4–76.8)
Platelets: 294 10*3/uL (ref 145–400)
RBC: 4.94 10*6/uL (ref 3.70–5.45)
RDW: 14.5 % (ref 11.2–14.5)
WBC: 6.8 10*3/uL (ref 3.9–10.3)
lymph#: 2.5 10*3/uL (ref 0.9–3.3)

## 2014-11-21 MED ORDER — ACETAMINOPHEN 325 MG PO TABS
ORAL_TABLET | ORAL | Status: AC
  Filled 2014-11-21: qty 2

## 2014-11-21 MED ORDER — SODIUM CHLORIDE 0.9 % IV SOLN
420.0000 mg | Freq: Once | INTRAVENOUS | Status: AC
  Administered 2014-11-21: 420 mg via INTRAVENOUS
  Filled 2014-11-21: qty 14

## 2014-11-21 MED ORDER — LORAZEPAM 2 MG/ML IJ SOLN
INTRAMUSCULAR | Status: AC
  Filled 2014-11-21: qty 1

## 2014-11-21 MED ORDER — SODIUM CHLORIDE 0.9 % IV SOLN
Freq: Once | INTRAVENOUS | Status: AC
  Administered 2014-11-21: 10:00:00 via INTRAVENOUS

## 2014-11-21 MED ORDER — LORAZEPAM 2 MG/ML IJ SOLN
0.5000 mg | Freq: Once | INTRAMUSCULAR | Status: AC
  Administered 2014-11-21: 0.5 mg via INTRAVENOUS

## 2014-11-21 MED ORDER — DIPHENHYDRAMINE HCL 25 MG PO CAPS
25.0000 mg | ORAL_CAPSULE | Freq: Once | ORAL | Status: AC
  Administered 2014-11-21: 25 mg via ORAL

## 2014-11-21 MED ORDER — ACETAMINOPHEN 325 MG PO TABS
650.0000 mg | ORAL_TABLET | Freq: Once | ORAL | Status: AC
  Administered 2014-11-21: 650 mg via ORAL

## 2014-11-21 MED ORDER — TRASTUZUMAB CHEMO INJECTION 440 MG
6.0000 mg/kg | Freq: Once | INTRAVENOUS | Status: AC
  Administered 2014-11-21: 903 mg via INTRAVENOUS
  Filled 2014-11-21: qty 43

## 2014-11-21 MED ORDER — SODIUM CHLORIDE 0.9 % IJ SOLN
10.0000 mL | INTRAMUSCULAR | Status: DC | PRN
  Administered 2014-11-21: 10 mL
  Filled 2014-11-21: qty 10

## 2014-11-21 MED ORDER — HEPARIN SOD (PORK) LOCK FLUSH 100 UNIT/ML IV SOLN
500.0000 [IU] | Freq: Once | INTRAVENOUS | Status: AC | PRN
  Administered 2014-11-21: 500 [IU]
  Filled 2014-11-21: qty 5

## 2014-11-21 MED ORDER — DIPHENHYDRAMINE HCL 25 MG PO CAPS
ORAL_CAPSULE | ORAL | Status: AC
  Filled 2014-11-21: qty 1

## 2014-11-21 NOTE — Patient Instructions (Signed)
Trastuzumab injection for infusion What is this medicine? TRASTUZUMAB (tras TOO zoo mab) is a monoclonal antibody. It targets a protein called HER2. This protein is found in some stomach and breast cancers. This medicine can stop cancer cell growth. This medicine may be used with other cancer treatments. This medicine may be used for other purposes; ask your health care provider or pharmacist if you have questions. COMMON BRAND NAME(S): Herceptin What should I tell my health care provider before I take this medicine? They need to know if you have any of these conditions: -heart disease -heart failure -infection (especially a virus infection such as chickenpox, cold sores, or herpes) -lung or breathing disease, like asthma -recent or ongoing radiation therapy -an unusual or allergic reaction to trastuzumab, benzyl alcohol, or other medications, foods, dyes, or preservatives -pregnant or trying to get pregnant -breast-feeding How should I use this medicine? This drug is given as an infusion into a vein. It is administered in a hospital or clinic by a specially trained health care professional. Talk to your pediatrician regarding the use of this medicine in children. This medicine is not approved for use in children. Overdosage: If you think you have taken too much of this medicine contact a poison control center or emergency room at once. NOTE: This medicine is only for you. Do not share this medicine with others. What if I miss a dose? It is important not to miss a dose. Call your doctor or health care professional if you are unable to keep an appointment. What may interact with this medicine? -cyclophosphamide -doxorubicin -warfarin This list may not describe all possible interactions. Give your health care provider a list of all the medicines, herbs, non-prescription drugs, or dietary supplements you use. Also tell them if you smoke, drink alcohol, or use illegal drugs. Some items may  interact with your medicine. What should I watch for while using this medicine? Visit your doctor for checks on your progress. Report any side effects. Continue your course of treatment even though you feel ill unless your doctor tells you to stop. Call your doctor or health care professional for advice if you get a fever, chills or sore throat, or other symptoms of a cold or flu. Do not treat yourself. Try to avoid being around people who are sick. You may experience fever, chills and shaking during your first infusion. These effects are usually mild and can be treated with other medicines. Report any side effects during the infusion to your health care professional. Fever and chills usually do not happen with later infusions. What side effects may I notice from receiving this medicine? Side effects that you should report to your doctor or other health care professional as soon as possible: -breathing difficulties -chest pain or palpitations -cough -dizziness or fainting -fever or chills, sore throat -skin rash, itching or hives -swelling of the legs or ankles -unusually weak or tired Side effects that usually do not require medical attention (report to your doctor or other health care professional if they continue or are bothersome): -loss of appetite -headache -muscle aches -nausea This list may not describe all possible side effects. Call your doctor for medical advice about side effects. You may report side effects to FDA at 1-800-FDA-1088. Where should I keep my medicine? This drug is given in a hospital or clinic and will not be stored at home. NOTE: This sheet is a summary. It may not cover all possible information. If you have questions about this medicine, talk   to your doctor, pharmacist, or health care provider.  2015, Elsevier/Gold Standard. (2009-06-08 13:43:15) Pertuzumab injection What is this medicine? PERTUZUMAB (per TOOZ ue mab) is a monoclonal antibody that targets a  protein called HER2. HER2 is found in some breast cancers. This medicine can stop cancer cell growth. This medicine is used with other cancer treatments. This medicine may be used for other purposes; ask your health care provider or pharmacist if you have questions. COMMON BRAND NAME(S): PERJETA What should I tell my health care provider before I take this medicine? They need to know if you have any of these conditions: -heart disease -heart failure -high blood pressure -history of irregular heart beat -recent or ongoing radiation therapy -an unusual or allergic reaction to pertuzumab, other medicines, foods, dyes, or preservatives -pregnant or trying to get pregnant -breast-feeding How should I use this medicine? This medicine is for infusion into a vein. It is given by a health care professional in a hospital or clinic setting. Talk to your pediatrician regarding the use of this medicine in children. Special care may be needed. Overdosage: If you think you've taken too much of this medicine contact a poison control center or emergency room at once. Overdosage: If you think you have taken too much of this medicine contact a poison control center or emergency room at once. NOTE: This medicine is only for you. Do not share this medicine with others. What if I miss a dose? It is important not to miss your dose. Call your doctor or health care professional if you are unable to keep an appointment. What may interact with this medicine? Interactions are not expected. Give your health care provider a list of all the medicines, herbs, non-prescription drugs, or dietary supplements you use. Also tell them if you smoke, drink alcohol, or use illegal drugs. Some items may interact with your medicine. This list may not describe all possible interactions. Give your health care provider a list of all the medicines, herbs, non-prescription drugs, or dietary supplements you use. Also tell them if you smoke,  drink alcohol, or use illegal drugs. Some items may interact with your medicine. What should I watch for while using this medicine? Your condition will be monitored carefully while you are receiving this medicine. Report any side effects. Continue your course of treatment even though you feel ill unless your doctor tells you to stop. Do not become pregnant while taking this medicine. Women should inform their doctor if they wish to become pregnant or think they might be pregnant. There is a potential for serious side effects to an unborn child. Talk to your health care professional or pharmacist for more information. Do not breast-feed an infant while taking this medicine. Call your doctor or health care professional for advice if you get a fever, chills or sore throat, or other symptoms of a cold or flu. Do not treat yourself. Try to avoid being around people who are sick. You may experience fever, chills, and headache during the infusion. Report any side effects during the infusion to your health care professional. What side effects may I notice from receiving this medicine? Side effects that you should report to your doctor or health care professional as soon as possible: -breathing problems -chest pain or palpitations -dizziness -feeling faint or lightheaded -fever or chills -skin rash, itching or hives -sore throat -swelling of the face, lips, or tongue -swelling of the legs or ankles -unusually weak or tired Side effects that usually do not require  medical attention (Report these to your doctor or health care professional if they continue or are bothersome.): -diarrhea -hair loss -nausea, vomiting -tiredness This list may not describe all possible side effects. Call your doctor for medical advice about side effects. You may report side effects to FDA at 1-800-FDA-1088. Where should I keep my medicine? This drug is given in a hospital or clinic and will not be stored at home. NOTE:  This sheet is a summary. It may not cover all possible information. If you have questions about this medicine, talk to your doctor, pharmacist, or health care provider.  2015, Elsevier/Gold Standard. (2012-06-02 16:54:15)  

## 2014-11-22 ENCOUNTER — Encounter: Payer: Self-pay | Admitting: *Deleted

## 2014-11-29 ENCOUNTER — Encounter: Payer: Self-pay | Admitting: Family Medicine

## 2014-11-29 ENCOUNTER — Ambulatory Visit: Payer: Medicare Other | Admitting: Family Medicine

## 2014-11-29 ENCOUNTER — Ambulatory Visit (INDEPENDENT_AMBULATORY_CARE_PROVIDER_SITE_OTHER): Payer: Medicare Other | Admitting: Family Medicine

## 2014-11-29 VITALS — BP 152/72 | HR 63 | Temp 98.5°F | Ht 64.0 in | Wt 309.1 lb

## 2014-11-29 DIAGNOSIS — M47813 Spondylosis without myelopathy or radiculopathy, cervicothoracic region: Secondary | ICD-10-CM

## 2014-11-29 DIAGNOSIS — E1165 Type 2 diabetes mellitus with hyperglycemia: Secondary | ICD-10-CM | POA: Diagnosis not present

## 2014-11-29 DIAGNOSIS — I10 Essential (primary) hypertension: Secondary | ICD-10-CM | POA: Diagnosis not present

## 2014-11-29 DIAGNOSIS — IMO0001 Reserved for inherently not codable concepts without codable children: Secondary | ICD-10-CM

## 2014-11-29 MED ORDER — NORTRIPTYLINE HCL 25 MG PO CAPS: ORAL_CAPSULE | ORAL | Status: DC

## 2014-11-29 NOTE — Patient Instructions (Addendum)
Good to see you today!  Thanks for coming in.  Diabetes Is better Go up to 28 units of Lantus once a day   BP Need to take each medicine as prescribe every day - Use pill box  Back Pain Slowly increase nortriptylene - take 2 tabs at bed time for 1 week then go up to 3 tabs for 1 week then go up to 4 tabs a night.     Come back in 2 months for a dm check  Bring all your meds again! Bring blood sugar readings

## 2014-11-30 ENCOUNTER — Other Ambulatory Visit: Payer: Self-pay | Admitting: *Deleted

## 2014-11-30 NOTE — Assessment & Plan Note (Signed)
Chronic not improving.  No imaging that I can find in Epic nor records of her neurosurgery visit.  No red flags for cancer or fracture.  Will increase nortriptylene.  Inquire further next visit about imaging and referrals.  May need to request records

## 2014-11-30 NOTE — Assessment & Plan Note (Signed)
Seems to be improving control.  Continue to increase lantus

## 2014-11-30 NOTE — Progress Notes (Signed)
   Subjective:    Patient ID: [name], female    DOB: [birth year], 64 y.o.   MRN: [id number]  HPI  Back Pain Has had lower back pain for years.  Continually bothers her.  No radiation.  No new lower extremitiy weakness nor incontinence.  No recent trauma. Taking nortriptylene only one 25 mg pill at night.  Has been on narcotics in past.    PMH Seen by Dr Maxie [facility] Neuro[facility] about 3 years ago according to pt  DIABETES Disease Monitoring: Blood Sugar ranges-brings in list low of 150 fasting to 290 after meals Polyuria/phagia/dipsia- no      Visual problems- no Medications: Compliance- brings all meds and insulin Hypoglycemic symptoms- no  HYPERTENSION Disease Monitoring Home BP Monitoring not checking Chest pain- no    Dyspnea- no Medications Compliance-  Not taking amlodpine ran out. Lightheadedness-  no  Edema- no ROS - See HPI  PMH Lab Review   POTASSIUM  Date Value Ref Range Status  11/21/2014 4.4 3.5 - 5.1 mEq/L Final  10/01/2014 4.5 3.5 - 5.1 mmol/L Final   SODIUM  Date Value Ref Range Status  11/21/2014 143 136 - 145 mEq/L Final  10/01/2014 138 135 - 145 mmol/L Final   CREATININE  Date Value Ref Range Status  11/21/2014 0.8 0.6 - 1.1 mg/dL Final   CREAT  Date Value Ref Range Status  12/10/2011 0.77 0.50 - 1.10 mg/dL Final   CREATININE, SER  Date Value Ref Range Status  10/01/2014 0.71 0.50 - 1.10 mg/dL Final     Chief Complaint noted Review of Symptoms - see HPI PMH - Smoking status noted.   Vital Signs reviewed         Review of Systems     Objective:   Physical Exam  Alert nad     Assessment & Plan:

## 2014-11-30 NOTE — Telephone Encounter (Signed)
Received a fax from [facility] needing new Rx for Lantus new dosage and directions.  Derl Barrow, RN

## 2014-11-30 NOTE — Assessment & Plan Note (Signed)
Not at goal.   Encouraged to take meds

## 2014-12-01 MED ORDER — INSULIN GLARGINE 100 UNIT/ML SOLOSTAR PEN: 28.0000 [IU] | PEN_INJECTOR | Freq: Every day | SUBCUTANEOUS | Status: DC

## 2014-12-11 ENCOUNTER — Other Ambulatory Visit: Payer: Self-pay | Admitting: *Deleted

## 2014-12-11 DIAGNOSIS — C50212 Malignant neoplasm of upper-inner quadrant of left female breast: Secondary | ICD-10-CM

## 2014-12-12 ENCOUNTER — Ambulatory Visit (HOSPITAL_BASED_OUTPATIENT_CLINIC_OR_DEPARTMENT_OTHER): Payer: Medicare Other

## 2014-12-12 ENCOUNTER — Other Ambulatory Visit (HOSPITAL_BASED_OUTPATIENT_CLINIC_OR_DEPARTMENT_OTHER): Payer: Medicare Other

## 2014-12-12 VITALS — BP 153/78 | HR 81 | Temp 98.3°F | Resp 20

## 2014-12-12 DIAGNOSIS — Z5112 Encounter for antineoplastic immunotherapy: Secondary | ICD-10-CM

## 2014-12-12 DIAGNOSIS — C50212 Malignant neoplasm of upper-inner quadrant of left female breast: Secondary | ICD-10-CM | POA: Diagnosis not present

## 2014-12-12 LAB — CBC WITH DIFFERENTIAL/PLATELET
BASO%: 0.3 % (ref 0.0–2.0)
Basophils Absolute: 0 10*3/uL (ref 0.0–0.1)
EOS%: 2.2 % (ref 0.0–7.0)
Eosinophils Absolute: 0.1 10*3/uL (ref 0.0–0.5)
HCT: 45.3 % (ref 34.8–46.6)
HGB: 14.9 g/dL (ref 11.6–15.9)
LYMPH%: 37.7 % (ref 14.0–49.7)
MCH: 28.4 pg (ref 25.1–34.0)
MCHC: 32.9 g/dL (ref 31.5–36.0)
MCV: 86.5 fL (ref 79.5–101.0)
MONO#: 0.4 10*3/uL (ref 0.1–0.9)
MONO%: 5.8 % (ref 0.0–14.0)
NEUT#: 3.5 10*3/uL (ref 1.5–6.5)
NEUT%: 54 % (ref 38.4–76.8)
Platelets: 250 10*3/uL (ref 145–400)
RBC: 5.24 10*6/uL (ref 3.70–5.45)
RDW: 13.9 % (ref 11.2–14.5)
WBC: 6.5 10*3/uL (ref 3.9–10.3)
lymph#: 2.5 10*3/uL (ref 0.9–3.3)

## 2014-12-12 LAB — COMPREHENSIVE METABOLIC PANEL (CC13)
ALT: 61 U/L — ABNORMAL HIGH (ref 0–55)
AST: 30 U/L (ref 5–34)
Albumin: 3.2 g/dL — ABNORMAL LOW (ref 3.5–5.0)
Alkaline Phosphatase: 145 U/L (ref 40–150)
Anion Gap: 14 mEq/L — ABNORMAL HIGH (ref 3–11)
BUN: 16.8 mg/dL (ref 7.0–26.0)
CO2: 26 mEq/L (ref 22–29)
Calcium: 10 mg/dL (ref 8.4–10.4)
Chloride: 99 mEq/L (ref 98–109)
Creatinine: 0.9 mg/dL (ref 0.6–1.1)
EGFR: 72 mL/min/{1.73_m2} — ABNORMAL LOW (ref >90)
Glucose: 257 mg/dl — ABNORMAL HIGH (ref 70–140)
Potassium: 4.2 mEq/L (ref 3.5–5.1)
Sodium: 139 mEq/L (ref 136–145)
Total Bilirubin: 0.3 mg/dL (ref 0.20–1.20)
Total Protein: 7.3 g/dL (ref 6.4–8.3)

## 2014-12-12 MED ORDER — DIPHENHYDRAMINE HCL 25 MG PO CAPS
25.0000 mg | ORAL_CAPSULE | Freq: Once | ORAL | Status: AC
Start: 2014-12-12 — End: 2014-12-12
  Administered 2014-12-12: 25 mg via ORAL

## 2014-12-12 MED ORDER — ACETAMINOPHEN 325 MG PO TABS
650.0000 mg | ORAL_TABLET | Freq: Once | ORAL | Status: AC
  Administered 2014-12-12: 650 mg via ORAL

## 2014-12-12 MED ORDER — ACETAMINOPHEN 325 MG PO TABS
ORAL_TABLET | ORAL | Status: AC
  Filled 2014-12-12: qty 2

## 2014-12-12 MED ORDER — HEPARIN SOD (PORK) LOCK FLUSH 100 UNIT/ML IV SOLN
500.0000 [IU] | Freq: Once | INTRAVENOUS | Status: AC | PRN
  Administered 2014-12-12: 500 [IU]
  Filled 2014-12-12: qty 5

## 2014-12-12 MED ORDER — TRASTUZUMAB CHEMO INJECTION 440 MG
6.0000 mg/kg | Freq: Once | INTRAVENOUS | Status: AC
  Administered 2014-12-12: 903 mg via INTRAVENOUS
  Filled 2014-12-12: qty 43

## 2014-12-12 MED ORDER — LORAZEPAM 2 MG/ML IJ SOLN
INTRAMUSCULAR | Status: AC
  Filled 2014-12-12: qty 1

## 2014-12-12 MED ORDER — DIPHENHYDRAMINE HCL 25 MG PO CAPS
ORAL_CAPSULE | ORAL | Status: AC
  Filled 2014-12-12: qty 1

## 2014-12-12 MED ORDER — SODIUM CHLORIDE 0.9 % IV SOLN
Freq: Once | INTRAVENOUS | Status: AC
  Administered 2014-12-12: 10:00:00 via INTRAVENOUS

## 2014-12-12 MED ORDER — PERTUZUMAB CHEMO INJECTION 420 MG/14ML
420.0000 mg | Freq: Once | INTRAVENOUS | Status: AC
  Administered 2014-12-12: 420 mg via INTRAVENOUS
  Filled 2014-12-12: qty 14

## 2014-12-12 MED ORDER — SODIUM CHLORIDE 0.9 % IJ SOLN
10.0000 mL | INTRAMUSCULAR | Status: DC | PRN
  Administered 2014-12-12: 10 mL
  Filled 2014-12-12: qty 10

## 2014-12-12 MED ORDER — LORAZEPAM 2 MG/ML IJ SOLN
0.5000 mg | Freq: Once | INTRAMUSCULAR | Status: AC
  Administered 2014-12-12: 0.5 mg via INTRAVENOUS

## 2014-12-12 NOTE — Patient Instructions (Signed)
[facility] Discharge Instructions for Patients Receiving Chemotherapy  Today you received the following chemotherapy agents herceptin/perjeta   BELOW ARE SYMPTOMS THAT SHOULD BE REPORTED IMMEDIATELY:  *FEVER GREATER THAN 100.5 F  *CHILLS WITH OR WITHOUT FEVER  NAUSEA AND VOMITING THAT IS NOT CONTROLLED WITH YOUR NAUSEA MEDICATION  *UNUSUAL SHORTNESS OF BREATH  *UNUSUAL BRUISING OR BLEEDING  TENDERNESS IN MOUTH AND THROAT WITH OR WITHOUT PRESENCE OF ULCERS  *URINARY PROBLEMS  *BOWEL PROBLEMS  UNUSUAL RASH Items with * indicate a potential emergency and should be followed up as soon as possible.  Feel free to call the clinic you have any questions or concerns. The clinic phone number is [phone].  

## 2015-01-01 ENCOUNTER — Other Ambulatory Visit: Payer: Self-pay | Admitting: *Deleted

## 2015-01-01 DIAGNOSIS — C7800 Secondary malignant neoplasm of unspecified lung: Principal | ICD-10-CM

## 2015-01-01 DIAGNOSIS — C50919 Malignant neoplasm of unspecified site of unspecified female breast: Secondary | ICD-10-CM

## 2015-01-02 ENCOUNTER — Telehealth: Payer: Self-pay | Admitting: Nurse Practitioner

## 2015-01-02 ENCOUNTER — Ambulatory Visit (HOSPITAL_BASED_OUTPATIENT_CLINIC_OR_DEPARTMENT_OTHER): Payer: Medicare Other

## 2015-01-02 ENCOUNTER — Encounter: Payer: Self-pay | Admitting: Nurse Practitioner

## 2015-01-02 ENCOUNTER — Ambulatory Visit (HOSPITAL_BASED_OUTPATIENT_CLINIC_OR_DEPARTMENT_OTHER): Payer: Medicare Other | Admitting: Nurse Practitioner

## 2015-01-02 ENCOUNTER — Encounter: Payer: Self-pay | Admitting: Oncology

## 2015-01-02 ENCOUNTER — Other Ambulatory Visit (HOSPITAL_BASED_OUTPATIENT_CLINIC_OR_DEPARTMENT_OTHER): Payer: Medicare Other

## 2015-01-02 VITALS — BP 166/75 | HR 70 | Temp 98.0°F | Resp 18 | Ht 64.0 in | Wt 308.3 lb

## 2015-01-02 DIAGNOSIS — C50912 Malignant neoplasm of unspecified site of left female breast: Secondary | ICD-10-CM

## 2015-01-02 DIAGNOSIS — C7800 Secondary malignant neoplasm of unspecified lung: Secondary | ICD-10-CM

## 2015-01-02 DIAGNOSIS — Z79811 Long term (current) use of aromatase inhibitors: Secondary | ICD-10-CM

## 2015-01-02 DIAGNOSIS — Z5112 Encounter for antineoplastic immunotherapy: Secondary | ICD-10-CM

## 2015-01-02 DIAGNOSIS — C50919 Malignant neoplasm of unspecified site of unspecified female breast: Secondary | ICD-10-CM

## 2015-01-02 DIAGNOSIS — C50212 Malignant neoplasm of upper-inner quadrant of left female breast: Secondary | ICD-10-CM

## 2015-01-02 LAB — COMPREHENSIVE METABOLIC PANEL (CC13)
ALT: 51 U/L (ref 0–55)
AST: 28 U/L (ref 5–34)
Albumin: 3.2 g/dL — ABNORMAL LOW (ref 3.5–5.0)
Alkaline Phosphatase: 129 U/L (ref 40–150)
Anion Gap: 13 mEq/L — ABNORMAL HIGH (ref 3–11)
BUN: 19.8 mg/dL (ref 7.0–26.0)
CO2: 27 mEq/L (ref 22–29)
Calcium: 9.7 mg/dL (ref 8.4–10.4)
Chloride: 102 mEq/L (ref 98–109)
Creatinine: 0.8 mg/dL (ref 0.6–1.1)
EGFR: 75 mL/min/{1.73_m2} — ABNORMAL LOW (ref >90)
Glucose: 217 mg/dl — ABNORMAL HIGH (ref 70–140)
Potassium: 3.6 mEq/L (ref 3.5–5.1)
Sodium: 142 mEq/L (ref 136–145)
Total Bilirubin: 0.42 mg/dL (ref 0.20–1.20)
Total Protein: 7.1 g/dL (ref 6.4–8.3)

## 2015-01-02 LAB — CBC WITH DIFFERENTIAL/PLATELET
BASO%: 1.3 % (ref 0.0–2.0)
Basophils Absolute: 0.1 10*3/uL (ref 0.0–0.1)
EOS%: 1.7 % (ref 0.0–7.0)
Eosinophils Absolute: 0.1 10*3/uL (ref 0.0–0.5)
HCT: 43.5 % (ref 34.8–46.6)
HGB: 14.3 g/dL (ref 11.6–15.9)
LYMPH%: 32 % (ref 14.0–49.7)
MCH: 27.9 pg (ref 25.1–34.0)
MCHC: 32.8 g/dL (ref 31.5–36.0)
MCV: 85 fL (ref 79.5–101.0)
MONO#: 0.4 10*3/uL (ref 0.1–0.9)
MONO%: 5.2 % (ref 0.0–14.0)
NEUT#: 4.3 10*3/uL (ref 1.5–6.5)
NEUT%: 59.8 % (ref 38.4–76.8)
Platelets: 283 10*3/uL (ref 145–400)
RBC: 5.12 10*6/uL (ref 3.70–5.45)
RDW: 14.2 % (ref 11.2–14.5)
WBC: 7.2 10*3/uL (ref 3.9–10.3)
lymph#: 2.3 10*3/uL (ref 0.9–3.3)

## 2015-01-02 MED ORDER — DIPHENHYDRAMINE HCL 25 MG PO CAPS
ORAL_CAPSULE | ORAL | Status: AC
  Filled 2015-01-02: qty 1

## 2015-01-02 MED ORDER — ACETAMINOPHEN 325 MG PO TABS
ORAL_TABLET | ORAL | Status: AC
  Filled 2015-01-02: qty 2

## 2015-01-02 MED ORDER — LORAZEPAM 2 MG/ML IJ SOLN
INTRAMUSCULAR | Status: AC
  Filled 2015-01-02: qty 1

## 2015-01-02 MED ORDER — SODIUM CHLORIDE 0.9 % IV SOLN
Freq: Once | INTRAVENOUS | Status: AC
  Administered 2015-01-02: 10:00:00 via INTRAVENOUS

## 2015-01-02 MED ORDER — PERTUZUMAB CHEMO INJECTION 420 MG/14ML
420.0000 mg | Freq: Once | INTRAVENOUS | Status: AC
  Administered 2015-01-02: 420 mg via INTRAVENOUS
  Filled 2015-01-02: qty 14

## 2015-01-02 MED ORDER — TRASTUZUMAB CHEMO INJECTION 440 MG
6.0000 mg/kg | Freq: Once | INTRAVENOUS | Status: AC
  Administered 2015-01-02: 903 mg via INTRAVENOUS
  Filled 2015-01-02: qty 43

## 2015-01-02 MED ORDER — LORAZEPAM 2 MG/ML IJ SOLN
0.5000 mg | Freq: Once | INTRAMUSCULAR | Status: AC
  Administered 2015-01-02: 0.5 mg via INTRAVENOUS

## 2015-01-02 MED ORDER — ACETAMINOPHEN 325 MG PO TABS
650.0000 mg | ORAL_TABLET | Freq: Once | ORAL | Status: AC
  Administered 2015-01-02: 650 mg via ORAL

## 2015-01-02 MED ORDER — SODIUM CHLORIDE 0.9 % IJ SOLN
10.0000 mL | INTRAMUSCULAR | Status: DC | PRN
  Administered 2015-01-02: 10 mL
  Filled 2015-01-02: qty 10

## 2015-01-02 MED ORDER — HEPARIN SOD (PORK) LOCK FLUSH 100 UNIT/ML IV SOLN
500.0000 [IU] | Freq: Once | INTRAVENOUS | Status: AC | PRN
  Administered 2015-01-02: 500 [IU]
  Filled 2015-01-02: qty 5

## 2015-01-02 MED ORDER — DIPHENHYDRAMINE HCL 25 MG PO CAPS
25.0000 mg | ORAL_CAPSULE | Freq: Once | ORAL | Status: AC
  Administered 2015-01-02: 25 mg via ORAL

## 2015-01-02 NOTE — Patient Instructions (Signed)
[facility] Discharge Instructions for Patients Receiving Chemotherapy  Today you received the following chemotherapy agents Herceptin/Perjeta To help prevent nausea and vomiting after your treatment, we encourage you to take your nausea medication as prescribed.   If you develop nausea and vomiting that is not controlled by your nausea medication, call the clinic.   BELOW ARE SYMPTOMS THAT SHOULD BE REPORTED IMMEDIATELY:  *FEVER GREATER THAN 100.5 F  *CHILLS WITH OR WITHOUT FEVER  NAUSEA AND VOMITING THAT IS NOT CONTROLLED WITH YOUR NAUSEA MEDICATION  *UNUSUAL SHORTNESS OF BREATH  *UNUSUAL BRUISING OR BLEEDING  TENDERNESS IN MOUTH AND THROAT WITH OR WITHOUT PRESENCE OF ULCERS  *URINARY PROBLEMS  *BOWEL PROBLEMS  UNUSUAL RASH Items with * indicate a potential emergency and should be followed up as soon as possible.  Feel free to call the clinic you have any questions or concerns. The clinic phone number is [phone].  Please show the CHEMO ALERT CARD at check-in to the Emergency Department and triage nurse.  

## 2015-01-02 NOTE — Progress Notes (Signed)
[id number] - 32 - patient in to the cancer center today for routine visit.  Patient was given ePROs in infusion.  She completed them independently.  I thanked the patient for her continued support of this clinical trial. Barb Kingsley Farace 01/02/2015.10:27AM
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
## 2015-01-02 NOTE — Telephone Encounter (Signed)
5/17 pof appointments made,[facility] will call with scan appointment,echo to precert to linda,patient will get a new avs in chemo today  anne

## 2015-01-20 ENCOUNTER — Other Ambulatory Visit: Payer: Self-pay | Admitting: Family Medicine

## 2015-01-20 ENCOUNTER — Other Ambulatory Visit: Payer: Self-pay | Admitting: Oncology

## 2015-01-23 ENCOUNTER — Ambulatory Visit (HOSPITAL_BASED_OUTPATIENT_CLINIC_OR_DEPARTMENT_OTHER): Payer: Medicare Other

## 2015-01-23 ENCOUNTER — Other Ambulatory Visit (HOSPITAL_BASED_OUTPATIENT_CLINIC_OR_DEPARTMENT_OTHER): Payer: Medicare Other

## 2015-01-23 VITALS — BP 153/77 | HR 69 | Temp 97.4°F | Resp 22

## 2015-01-23 DIAGNOSIS — C50912 Malignant neoplasm of unspecified site of left female breast: Secondary | ICD-10-CM | POA: Diagnosis not present

## 2015-01-23 DIAGNOSIS — C50212 Malignant neoplasm of upper-inner quadrant of left female breast: Secondary | ICD-10-CM

## 2015-01-23 DIAGNOSIS — C7800 Secondary malignant neoplasm of unspecified lung: Principal | ICD-10-CM

## 2015-01-23 DIAGNOSIS — C50919 Malignant neoplasm of unspecified site of unspecified female breast: Secondary | ICD-10-CM

## 2015-01-23 DIAGNOSIS — Z5112 Encounter for antineoplastic immunotherapy: Secondary | ICD-10-CM

## 2015-01-23 LAB — CBC WITH DIFFERENTIAL/PLATELET
BASO%: 0.3 % (ref 0.0–2.0)
Basophils Absolute: 0 10*3/uL (ref 0.0–0.1)
EOS%: 2.8 % (ref 0.0–7.0)
Eosinophils Absolute: 0.2 10*3/uL (ref 0.0–0.5)
HCT: 45 % (ref 34.8–46.6)
HGB: 14.7 g/dL (ref 11.6–15.9)
LYMPH%: 32.9 % (ref 14.0–49.7)
MCH: 28.3 pg (ref 25.1–34.0)
MCHC: 32.7 g/dL (ref 31.5–36.0)
MCV: 86.7 fL (ref 79.5–101.0)
MONO#: 0.4 10*3/uL (ref 0.1–0.9)
MONO%: 5.6 % (ref 0.0–14.0)
NEUT#: 4.6 10*3/uL (ref 1.5–6.5)
NEUT%: 58.4 % (ref 38.4–76.8)
Platelets: 274 10*3/uL (ref 145–400)
RBC: 5.19 10*6/uL (ref 3.70–5.45)
RDW: 13.6 % (ref 11.2–14.5)
WBC: 7.9 10*3/uL (ref 3.9–10.3)
lymph#: 2.6 10*3/uL (ref 0.9–3.3)

## 2015-01-23 LAB — COMPREHENSIVE METABOLIC PANEL (CC13)
ALT: 56 U/L — ABNORMAL HIGH (ref 0–55)
AST: 35 U/L — ABNORMAL HIGH (ref 5–34)
Albumin: 3.2 g/dL — ABNORMAL LOW (ref 3.5–5.0)
Alkaline Phosphatase: 145 U/L (ref 40–150)
Anion Gap: 9 mEq/L (ref 3–11)
BUN: 15.8 mg/dL (ref 7.0–26.0)
CO2: 31 mEq/L — ABNORMAL HIGH (ref 22–29)
Calcium: 10.1 mg/dL (ref 8.4–10.4)
Chloride: 102 mEq/L (ref 98–109)
Creatinine: 0.8 mg/dL (ref 0.6–1.1)
EGFR: 79 mL/min/{1.73_m2} — ABNORMAL LOW (ref >90)
Glucose: 162 mg/dl — ABNORMAL HIGH (ref 70–140)
Potassium: 4 mEq/L (ref 3.5–5.1)
Sodium: 142 mEq/L (ref 136–145)
Total Bilirubin: 0.37 mg/dL (ref 0.20–1.20)
Total Protein: 7.5 g/dL (ref 6.4–8.3)

## 2015-01-23 MED ORDER — LORAZEPAM 2 MG/ML IJ SOLN
INTRAMUSCULAR | Status: AC
  Filled 2015-01-23: qty 1

## 2015-01-23 MED ORDER — LORAZEPAM 2 MG/ML IJ SOLN
0.5000 mg | Freq: Once | INTRAMUSCULAR | Status: AC
  Administered 2015-01-23: 0.5 mg via INTRAVENOUS

## 2015-01-23 MED ORDER — ACETAMINOPHEN 325 MG PO TABS
ORAL_TABLET | ORAL | Status: AC
  Filled 2015-01-23: qty 2

## 2015-01-23 MED ORDER — HEPARIN SOD (PORK) LOCK FLUSH 100 UNIT/ML IV SOLN
500.0000 [IU] | Freq: Once | INTRAVENOUS | Status: AC | PRN
  Administered 2015-01-23: 500 [IU]
  Filled 2015-01-23: qty 5

## 2015-01-23 MED ORDER — ACETAMINOPHEN 325 MG PO TABS
650.0000 mg | ORAL_TABLET | Freq: Once | ORAL | Status: AC
  Administered 2015-01-23: 650 mg via ORAL

## 2015-01-23 MED ORDER — DIPHENHYDRAMINE HCL 25 MG PO CAPS
25.0000 mg | ORAL_CAPSULE | Freq: Once | ORAL | Status: AC
  Administered 2015-01-23: 25 mg via ORAL

## 2015-01-23 MED ORDER — DIPHENHYDRAMINE HCL 25 MG PO CAPS
ORAL_CAPSULE | ORAL | Status: AC
  Filled 2015-01-23: qty 1

## 2015-01-23 MED ORDER — SODIUM CHLORIDE 0.9 % IV SOLN
6.0000 mg/kg | Freq: Once | INTRAVENOUS | Status: AC
  Administered 2015-01-23: 903 mg via INTRAVENOUS
  Filled 2015-01-23: qty 43

## 2015-01-23 MED ORDER — SODIUM CHLORIDE 0.9 % IV SOLN
Freq: Once | INTRAVENOUS | Status: AC
  Administered 2015-01-23: 10:00:00 via INTRAVENOUS

## 2015-01-23 MED ORDER — SODIUM CHLORIDE 0.9 % IV SOLN
420.0000 mg | Freq: Once | INTRAVENOUS | Status: AC
  Administered 2015-01-23: 420 mg via INTRAVENOUS
  Filled 2015-01-23: qty 14

## 2015-01-23 MED ORDER — SODIUM CHLORIDE 0.9 % IJ SOLN
10.0000 mL | INTRAMUSCULAR | Status: DC | PRN
  Administered 2015-01-23: 10 mL
  Filled 2015-01-23: qty 10

## 2015-01-23 NOTE — Patient Instructions (Signed)
[facility] Discharge Instructions for Patients Receiving Chemotherapy  Today you received the following chemotherapy agents Herceptin/Perjeta To help prevent nausea and vomiting after your treatment, we encourage you to take your nausea medication as prescribed.   If you develop nausea and vomiting that is not controlled by your nausea medication, call the clinic.   BELOW ARE SYMPTOMS THAT SHOULD BE REPORTED IMMEDIATELY:  *FEVER GREATER THAN 100.5 F  *CHILLS WITH OR WITHOUT FEVER  NAUSEA AND VOMITING THAT IS NOT CONTROLLED WITH YOUR NAUSEA MEDICATION  *UNUSUAL SHORTNESS OF BREATH  *UNUSUAL BRUISING OR BLEEDING  TENDERNESS IN MOUTH AND THROAT WITH OR WITHOUT PRESENCE OF ULCERS  *URINARY PROBLEMS  *BOWEL PROBLEMS  UNUSUAL RASH Items with * indicate a potential emergency and should be followed up as soon as possible.  Feel free to call the clinic you have any questions or concerns. The clinic phone number is [phone].  Please show the CHEMO ALERT CARD at check-in to the Emergency Department and triage nurse.  

## 2015-01-29 ENCOUNTER — Other Ambulatory Visit: Payer: Self-pay | Admitting: Nurse Practitioner

## 2015-01-29 DIAGNOSIS — C50919 Malignant neoplasm of unspecified site of unspecified female breast: Secondary | ICD-10-CM

## 2015-01-29 DIAGNOSIS — C7800 Secondary malignant neoplasm of unspecified lung: Principal | ICD-10-CM

## 2015-01-30 ENCOUNTER — Telehealth: Payer: Self-pay | Admitting: *Deleted

## 2015-01-30 NOTE — Telephone Encounter (Signed)
INFORMED PT. WILL CALL HER BACK AFTER SPEAKING WITH A SCHEDULER.

## 2015-01-30 NOTE — Telephone Encounter (Signed)
SPOKE WITH AMBER IN SCHEDULING. SHE WILL CALL PT. TO SCHEDULE THE ECHO.

## 2015-01-31 ENCOUNTER — Other Ambulatory Visit: Payer: Self-pay | Admitting: Nurse Practitioner

## 2015-01-31 ENCOUNTER — Telehealth: Payer: Self-pay | Admitting: Oncology

## 2015-01-31 DIAGNOSIS — C50919 Malignant neoplasm of unspecified site of unspecified female breast: Secondary | ICD-10-CM

## 2015-01-31 DIAGNOSIS — C7800 Secondary malignant neoplasm of unspecified lung: Principal | ICD-10-CM

## 2015-01-31 NOTE — Telephone Encounter (Signed)
Left message to confirm appointment for ECHO.

## 2015-02-13 ENCOUNTER — Ambulatory Visit (HOSPITAL_BASED_OUTPATIENT_CLINIC_OR_DEPARTMENT_OTHER): Payer: Medicare Other

## 2015-02-13 ENCOUNTER — Other Ambulatory Visit (HOSPITAL_BASED_OUTPATIENT_CLINIC_OR_DEPARTMENT_OTHER): Payer: Medicare Other

## 2015-02-13 VITALS — BP 111/86 | HR 70 | Temp 98.1°F | Resp 18

## 2015-02-13 DIAGNOSIS — C7802 Secondary malignant neoplasm of left lung: Secondary | ICD-10-CM | POA: Diagnosis not present

## 2015-02-13 DIAGNOSIS — C50212 Malignant neoplasm of upper-inner quadrant of left female breast: Secondary | ICD-10-CM

## 2015-02-13 DIAGNOSIS — C7800 Secondary malignant neoplasm of unspecified lung: Principal | ICD-10-CM

## 2015-02-13 DIAGNOSIS — C50919 Malignant neoplasm of unspecified site of unspecified female breast: Secondary | ICD-10-CM

## 2015-02-13 DIAGNOSIS — Z5112 Encounter for antineoplastic immunotherapy: Secondary | ICD-10-CM | POA: Diagnosis not present

## 2015-02-13 LAB — COMPREHENSIVE METABOLIC PANEL (CC13)
ALT: 53 U/L (ref 0–55)
AST: 34 U/L (ref 5–34)
Albumin: 3.4 g/dL — ABNORMAL LOW (ref 3.5–5.0)
Alkaline Phosphatase: 159 U/L — ABNORMAL HIGH (ref 40–150)
Anion Gap: 9 mEq/L (ref 3–11)
BUN: 13.4 mg/dL (ref 7.0–26.0)
CO2: 31 mEq/L — ABNORMAL HIGH (ref 22–29)
Calcium: 9.9 mg/dL (ref 8.4–10.4)
Chloride: 100 mEq/L (ref 98–109)
Creatinine: 0.8 mg/dL (ref 0.6–1.1)
EGFR: 75 mL/min/{1.73_m2} — ABNORMAL LOW (ref >90)
Glucose: 186 mg/dl — ABNORMAL HIGH (ref 70–140)
Potassium: 4.1 mEq/L (ref 3.5–5.1)
Sodium: 141 mEq/L (ref 136–145)
Total Bilirubin: 0.37 mg/dL (ref 0.20–1.20)
Total Protein: 7.8 g/dL (ref 6.4–8.3)

## 2015-02-13 LAB — CBC WITH DIFFERENTIAL/PLATELET
BASO%: 0.3 % (ref 0.0–2.0)
Basophils Absolute: 0 10*3/uL (ref 0.0–0.1)
EOS%: 1.9 % (ref 0.0–7.0)
Eosinophils Absolute: 0.1 10*3/uL (ref 0.0–0.5)
HCT: 45.8 % (ref 34.8–46.6)
HGB: 15.1 g/dL (ref 11.6–15.9)
LYMPH%: 33.9 % (ref 14.0–49.7)
MCH: 28.2 pg (ref 25.1–34.0)
MCHC: 33 g/dL (ref 31.5–36.0)
MCV: 85.6 fL (ref 79.5–101.0)
MONO#: 0.3 10*3/uL (ref 0.1–0.9)
MONO%: 4.7 % (ref 0.0–14.0)
NEUT#: 4.3 10*3/uL (ref 1.5–6.5)
NEUT%: 59.2 % (ref 38.4–76.8)
Platelets: 260 10*3/uL (ref 145–400)
RBC: 5.35 10*6/uL (ref 3.70–5.45)
RDW: 13.8 % (ref 11.2–14.5)
WBC: 7.2 10*3/uL (ref 3.9–10.3)
lymph#: 2.5 10*3/uL (ref 0.9–3.3)

## 2015-02-13 MED ORDER — ACETAMINOPHEN 325 MG PO TABS
ORAL_TABLET | ORAL | Status: AC
  Filled 2015-02-13: qty 2

## 2015-02-13 MED ORDER — SODIUM CHLORIDE 0.9 % IJ SOLN
10.0000 mL | INTRAMUSCULAR | Status: DC | PRN
  Administered 2015-02-13: 10 mL
  Filled 2015-02-13: qty 10

## 2015-02-13 MED ORDER — ACETAMINOPHEN 325 MG PO TABS
650.0000 mg | ORAL_TABLET | Freq: Once | ORAL | Status: AC
  Administered 2015-02-13: 650 mg via ORAL

## 2015-02-13 MED ORDER — LORAZEPAM 2 MG/ML IJ SOLN
0.5000 mg | Freq: Once | INTRAMUSCULAR | Status: AC
  Administered 2015-02-13: 0.5 mg via INTRAVENOUS

## 2015-02-13 MED ORDER — HEPARIN SOD (PORK) LOCK FLUSH 100 UNIT/ML IV SOLN
500.0000 [IU] | Freq: Once | INTRAVENOUS | Status: AC | PRN
  Administered 2015-02-13: 500 [IU]
  Filled 2015-02-13: qty 5

## 2015-02-13 MED ORDER — DIPHENHYDRAMINE HCL 25 MG PO CAPS
ORAL_CAPSULE | ORAL | Status: AC
  Filled 2015-02-13: qty 1

## 2015-02-13 MED ORDER — DIPHENHYDRAMINE HCL 25 MG PO CAPS
25.0000 mg | ORAL_CAPSULE | Freq: Once | ORAL | Status: AC
  Administered 2015-02-13: 25 mg via ORAL

## 2015-02-13 MED ORDER — SODIUM CHLORIDE 0.9 % IV SOLN
420.0000 mg | Freq: Once | INTRAVENOUS | Status: AC
  Administered 2015-02-13: 420 mg via INTRAVENOUS
  Filled 2015-02-13: qty 14

## 2015-02-13 MED ORDER — SODIUM CHLORIDE 0.9 % IV SOLN
6.0000 mg/kg | Freq: Once | INTRAVENOUS | Status: AC
  Administered 2015-02-13: 903 mg via INTRAVENOUS
  Filled 2015-02-13: qty 43

## 2015-02-13 MED ORDER — SODIUM CHLORIDE 0.9 % IV SOLN
Freq: Once | INTRAVENOUS | Status: AC
  Administered 2015-02-13: 10:00:00 via INTRAVENOUS

## 2015-02-13 MED ORDER — LORAZEPAM 2 MG/ML IJ SOLN
INTRAMUSCULAR | Status: AC
  Filled 2015-02-13: qty 1

## 2015-02-13 NOTE — Patient Instructions (Signed)
[facility] Discharge Instructions for Patients Receiving Chemotherapy  Today you received the following chemotherapy agents Herceptin/Perjeta.  To help prevent nausea and vomiting after your treatment, we encourage you to take your nausea medication as directed.    If you develop nausea and vomiting that is not controlled by your nausea medication, call the clinic.   BELOW ARE SYMPTOMS THAT SHOULD BE REPORTED IMMEDIATELY:  *FEVER GREATER THAN 100.5 F  *CHILLS WITH OR WITHOUT FEVER  NAUSEA AND VOMITING THAT IS NOT CONTROLLED WITH YOUR NAUSEA MEDICATION  *UNUSUAL SHORTNESS OF BREATH  *UNUSUAL BRUISING OR BLEEDING  TENDERNESS IN MOUTH AND THROAT WITH OR WITHOUT PRESENCE OF ULCERS  *URINARY PROBLEMS  *BOWEL PROBLEMS  UNUSUAL RASH Items with * indicate a potential emergency and should be followed up as soon as possible.  Feel free to call the clinic you have any questions or concerns. The clinic phone number is [phone].  Please show the CHEMO ALERT CARD at check-in to the Emergency Department and triage nurse.   

## 2015-02-21 ENCOUNTER — Ambulatory Visit (INDEPENDENT_AMBULATORY_CARE_PROVIDER_SITE_OTHER): Payer: Medicare Other | Admitting: Family Medicine

## 2015-02-21 ENCOUNTER — Encounter: Payer: Self-pay | Admitting: Family Medicine

## 2015-02-21 VITALS — BP 131/67 | HR 71 | Temp 98.7°F | Ht 64.0 in | Wt 309.0 lb

## 2015-02-21 DIAGNOSIS — IMO0001 Reserved for inherently not codable concepts without codable children: Secondary | ICD-10-CM

## 2015-02-21 DIAGNOSIS — E1149 Type 2 diabetes mellitus with other diabetic neurological complication: Secondary | ICD-10-CM

## 2015-02-21 DIAGNOSIS — E1165 Type 2 diabetes mellitus with hyperglycemia: Secondary | ICD-10-CM | POA: Diagnosis not present

## 2015-02-21 DIAGNOSIS — I10 Essential (primary) hypertension: Secondary | ICD-10-CM

## 2015-02-21 DIAGNOSIS — M48061 Spinal stenosis, lumbar region without neurogenic claudication: Secondary | ICD-10-CM

## 2015-02-21 DIAGNOSIS — E1140 Type 2 diabetes mellitus with diabetic neuropathy, unspecified: Secondary | ICD-10-CM

## 2015-02-21 DIAGNOSIS — M4806 Spinal stenosis, lumbar region: Secondary | ICD-10-CM

## 2015-02-21 LAB — POCT GLYCOSYLATED HEMOGLOBIN (HGB A1C): Hemoglobin A1C: 7.6

## 2015-02-21 NOTE — Assessment & Plan Note (Signed)
Improved control. 

## 2015-02-21 NOTE — Progress Notes (Signed)
   Subjective:    Patient ID: [name], female    DOB: 05/28/1951, 65 y.o.   MRN: [id number]  HPI  HYPERTENSION Disease Monitoring: Blood pressure range-not checking Chest pain, palpitations- no      Dyspnea- no Medications: Compliance- sometimes forgets.  Using pill box Lightheadedness,Syncope- no   Edema- mild  DIABETES Disease Monitoring: Blood Sugar ranges-not checking Polyuria/phagia/dipsia- no      Visual problems- no Medications: Compliance- occsl forgets Hypoglycemic symptoms- does not think so  Low Back Pain Continues as has for years.  Tried the nortriptylene brielfly but stopped because did not help.  Unable to walk for more than a few feet due to pain.  No incontinence.  Has not had spine immaging for a while   Monitoring Labs and Parameters Last A1C:  Lab Results  Component Value Date   HGBA1C 7.6 02/21/2015    Last Lipid:     Component Value Date/Time   CHOL 140 10/27/2012 1653   HDL 56 10/27/2012 1653    Last Bmet  POTASSIUM  Date Value Ref Range Status  02/13/2015 4.1 3.5 - 5.1 mEq/L Final  10/01/2014 4.5 3.5 - 5.1 mmol/L Final   SODIUM  Date Value Ref Range Status  02/13/2015 141 136 - 145 mEq/L Final  10/01/2014 138 135 - 145 mmol/L Final   CREATININE  Date Value Ref Range Status  02/13/2015 0.8 0.6 - 1.1 mg/dL Final   CREAT  Date Value Ref Range Status  12/10/2011 0.77 0.50 - 1.10 mg/dL Final   CREATININE, SER  Date Value Ref Range Status  10/01/2014 0.71 0.50 - 1.10 mg/dL Final      Last BPs:  BP Readings from Last 3 Encounters:  02/21/15 131/67  02/13/15 111/86  01/23/15 153/77    Chief Complaint noted Review of Symptoms - see HPI PMH - Smoking status noted.   Vital Signs reviewed    Review of Systems     Objective:   Physical Exam  Obese in wheel chair Can take a few steps but co pain Able to stand on heels and toes for 30 seconds Shallow deep knee bend but has knee pain Heart - Regular rate and rhythm.  No  murmurs, gallops or rubs.    Lungs:  Normal respiratory effort, chest expands symmetrically. Lungs are clear to auscultation, no crackles or wheezes.       Assessment & Plan:

## 2015-02-21 NOTE — Patient Instructions (Signed)
Good to see you today!  Thanks for coming in.  Your diabetes is doing well  Back Pain Slowly increase nortriptylene - take 2 tabs at bed time for 1 week then go up to 3 tabs for 1 week then go up to 4 tabs a night.   Get the xray  - I will let you know the result  Slowly try to walk a little further every second day  Come back in 3 months for diabetes check

## 2015-02-21 NOTE — Assessment & Plan Note (Signed)
Continues poorly controlled.  Check plain xray given her ho cancer. Retry titrating nortriptylene.  Encourage ambulatino

## 2015-02-21 NOTE — Assessment & Plan Note (Signed)
BP Readings from Last 3 Encounters:  02/21/15 131/67  02/13/15 111/86  01/23/15 153/77   Well controlled

## 2015-02-28 ENCOUNTER — Other Ambulatory Visit: Payer: Self-pay | Admitting: Nurse Practitioner

## 2015-02-28 ENCOUNTER — Ambulatory Visit (HOSPITAL_COMMUNITY)
Admission: RE | Admit: 2015-02-28 | Discharge: 2015-02-28 | Disposition: A | Discharge status: Home / Self Care | Payer: Medicare Other | Source: Ambulatory Visit | Attending: Nurse Practitioner | Admitting: Nurse Practitioner

## 2015-02-28 DIAGNOSIS — I517 Cardiomegaly: Secondary | ICD-10-CM | POA: Insufficient documentation

## 2015-02-28 DIAGNOSIS — D493 Neoplasm of unspecified behavior of breast: Secondary | ICD-10-CM | POA: Diagnosis present

## 2015-02-28 DIAGNOSIS — C7800 Secondary malignant neoplasm of unspecified lung: Secondary | ICD-10-CM

## 2015-02-28 DIAGNOSIS — C50919 Malignant neoplasm of unspecified site of unspecified female breast: Secondary | ICD-10-CM

## 2015-02-28 MED ORDER — PERFLUTREN LIPID MICROSPHERE
INTRAVENOUS | Status: AC
  Filled 2015-02-28: qty 10

## 2015-02-28 NOTE — Progress Notes (Signed)
Echocardiogram 2D Echocardiogram limited has been performed. Patient needed Definity but no nurse available.  Tresa Res 02/28/2015, 11:07 AM

## 2015-03-05 ENCOUNTER — Other Ambulatory Visit: Payer: Self-pay | Admitting: *Deleted

## 2015-03-05 MED ORDER — INSULIN ASPART 100 UNIT/ML FLEXPEN: 10.0000 [IU] | PEN_INJECTOR | Freq: Three times a day (TID) | SUBCUTANEOUS | Status: DC

## 2015-03-05 MED ORDER — AMLODIPINE BESYLATE 5 MG PO TABS: ORAL_TABLET | ORAL | Status: DC

## 2015-03-05 MED ORDER — INSULIN GLARGINE 100 UNIT/ML SOLOSTAR PEN: 28.0000 [IU] | PEN_INJECTOR | Freq: Every day | SUBCUTANEOUS | Status: DC

## 2015-03-05 MED ORDER — LOSARTAN POTASSIUM-HCTZ 100-12.5 MG PO TABS: 1.0000 | ORAL_TABLET | Freq: Every day | ORAL | Status: DC

## 2015-03-06 ENCOUNTER — Other Ambulatory Visit: Payer: Self-pay | Admitting: *Deleted

## 2015-03-06 ENCOUNTER — Other Ambulatory Visit: Payer: Self-pay | Admitting: Oncology

## 2015-03-06 ENCOUNTER — Ambulatory Visit (HOSPITAL_BASED_OUTPATIENT_CLINIC_OR_DEPARTMENT_OTHER): Payer: Medicare Other

## 2015-03-06 ENCOUNTER — Other Ambulatory Visit (HOSPITAL_BASED_OUTPATIENT_CLINIC_OR_DEPARTMENT_OTHER): Payer: Medicare Other

## 2015-03-06 VITALS — BP 155/79 | HR 62 | Temp 97.0°F | Resp 18

## 2015-03-06 DIAGNOSIS — C7802 Secondary malignant neoplasm of left lung: Secondary | ICD-10-CM

## 2015-03-06 DIAGNOSIS — C7800 Secondary malignant neoplasm of unspecified lung: Principal | ICD-10-CM

## 2015-03-06 DIAGNOSIS — C50919 Malignant neoplasm of unspecified site of unspecified female breast: Secondary | ICD-10-CM

## 2015-03-06 DIAGNOSIS — C50212 Malignant neoplasm of upper-inner quadrant of left female breast: Secondary | ICD-10-CM

## 2015-03-06 DIAGNOSIS — Z5112 Encounter for antineoplastic immunotherapy: Secondary | ICD-10-CM

## 2015-03-06 LAB — CBC WITH DIFFERENTIAL/PLATELET
BASO%: 0.4 % (ref 0.0–2.0)
Basophils Absolute: 0 10*3/uL (ref 0.0–0.1)
EOS%: 2.7 % (ref 0.0–7.0)
Eosinophils Absolute: 0.2 10*3/uL (ref 0.0–0.5)
HCT: 42.9 % (ref 34.8–46.6)
HGB: 14.2 g/dL (ref 11.6–15.9)
LYMPH%: 33.4 % (ref 14.0–49.7)
MCH: 27.8 pg (ref 25.1–34.0)
MCHC: 33.1 g/dL (ref 31.5–36.0)
MCV: 84 fL (ref 79.5–101.0)
MONO#: 0.4 10*3/uL (ref 0.1–0.9)
MONO%: 5.6 % (ref 0.0–14.0)
NEUT#: 4 10*3/uL (ref 1.5–6.5)
NEUT%: 57.9 % (ref 38.4–76.8)
Platelets: 241 10*3/uL (ref 145–400)
RBC: 5.1 10*6/uL (ref 3.70–5.45)
RDW: 14.3 % (ref 11.2–14.5)
WBC: 7 10*3/uL (ref 3.9–10.3)
lymph#: 2.3 10*3/uL (ref 0.9–3.3)

## 2015-03-06 LAB — COMPREHENSIVE METABOLIC PANEL (CC13)
ALT: 46 U/L (ref 0–55)
AST: 24 U/L (ref 5–34)
Albumin: 3.1 g/dL — ABNORMAL LOW (ref 3.5–5.0)
Alkaline Phosphatase: 134 U/L (ref 40–150)
Anion Gap: 8 mEq/L (ref 3–11)
BUN: 16 mg/dL (ref 7.0–26.0)
CO2: 30 mEq/L — ABNORMAL HIGH (ref 22–29)
Calcium: 9.8 mg/dL (ref 8.4–10.4)
Chloride: 104 mEq/L (ref 98–109)
Creatinine: 0.8 mg/dL (ref 0.6–1.1)
EGFR: 73 mL/min/{1.73_m2} — ABNORMAL LOW (ref >90)
Glucose: 258 mg/dl — ABNORMAL HIGH (ref 70–140)
Potassium: 4.1 mEq/L (ref 3.5–5.1)
Sodium: 141 mEq/L (ref 136–145)
Total Bilirubin: 0.32 mg/dL (ref 0.20–1.20)
Total Protein: 7 g/dL (ref 6.4–8.3)

## 2015-03-06 MED ORDER — DIPHENHYDRAMINE HCL 25 MG PO CAPS
25.0000 mg | ORAL_CAPSULE | Freq: Once | ORAL | Status: AC
  Administered 2015-03-06: 25 mg via ORAL

## 2015-03-06 MED ORDER — SODIUM CHLORIDE 0.9 % IV SOLN
Freq: Once | INTRAVENOUS | Status: AC
  Administered 2015-03-06: 10:00:00 via INTRAVENOUS

## 2015-03-06 MED ORDER — LORAZEPAM 2 MG/ML IJ SOLN
INTRAMUSCULAR | Status: AC
  Filled 2015-03-06: qty 1

## 2015-03-06 MED ORDER — TRASTUZUMAB CHEMO INJECTION 440 MG
6.0000 mg/kg | Freq: Once | INTRAVENOUS | Status: AC
  Administered 2015-03-06: 903 mg via INTRAVENOUS
  Filled 2015-03-06: qty 43

## 2015-03-06 MED ORDER — SODIUM CHLORIDE 0.9 % IJ SOLN
10.0000 mL | INTRAMUSCULAR | Status: DC | PRN
  Administered 2015-03-06: 10 mL
  Filled 2015-03-06: qty 10

## 2015-03-06 MED ORDER — PERTUZUMAB CHEMO INJECTION 420 MG/14ML
420.0000 mg | Freq: Once | INTRAVENOUS | Status: AC
  Administered 2015-03-06: 420 mg via INTRAVENOUS
  Filled 2015-03-06: qty 14

## 2015-03-06 MED ORDER — ACETAMINOPHEN 325 MG PO TABS
ORAL_TABLET | ORAL | Status: AC
  Filled 2015-03-06: qty 2

## 2015-03-06 MED ORDER — ACETAMINOPHEN 325 MG PO TABS
650.0000 mg | ORAL_TABLET | Freq: Once | ORAL | Status: AC
  Administered 2015-03-06: 650 mg via ORAL

## 2015-03-06 MED ORDER — HEPARIN SOD (PORK) LOCK FLUSH 100 UNIT/ML IV SOLN
500.0000 [IU] | Freq: Once | INTRAVENOUS | Status: AC | PRN
  Administered 2015-03-06: 500 [IU]
  Filled 2015-03-06: qty 5

## 2015-03-06 MED ORDER — LORAZEPAM 2 MG/ML IJ SOLN
0.5000 mg | Freq: Once | INTRAMUSCULAR | Status: AC
  Administered 2015-03-06: 0.5 mg via INTRAVENOUS

## 2015-03-06 MED ORDER — DIPHENHYDRAMINE HCL 25 MG PO CAPS
ORAL_CAPSULE | ORAL | Status: AC
  Filled 2015-03-06: qty 1

## 2015-03-06 NOTE — Patient Instructions (Signed)
[facility] Discharge Instructions for Patients Receiving Chemotherapy  Today you received the following chemotherapy agents Herceptin/Perjeta.  To help prevent nausea and vomiting after your treatment, we encourage you to take your nausea medication as directed.    If you develop nausea and vomiting that is not controlled by your nausea medication, call the clinic.   BELOW ARE SYMPTOMS THAT SHOULD BE REPORTED IMMEDIATELY:  *FEVER GREATER THAN 100.5 F  *CHILLS WITH OR WITHOUT FEVER  NAUSEA AND VOMITING THAT IS NOT CONTROLLED WITH YOUR NAUSEA MEDICATION  *UNUSUAL SHORTNESS OF BREATH  *UNUSUAL BRUISING OR BLEEDING  TENDERNESS IN MOUTH AND THROAT WITH OR WITHOUT PRESENCE OF ULCERS  *URINARY PROBLEMS  *BOWEL PROBLEMS  UNUSUAL RASH Items with * indicate a potential emergency and should be followed up as soon as possible.  Feel free to call the clinic you have any questions or concerns. The clinic phone number is [phone].  Please show the CHEMO ALERT CARD at check-in to the Emergency Department and triage nurse.   

## 2015-03-14 ENCOUNTER — Encounter: Payer: Self-pay | Admitting: *Deleted

## 2015-03-27 ENCOUNTER — Ambulatory Visit (HOSPITAL_BASED_OUTPATIENT_CLINIC_OR_DEPARTMENT_OTHER): Payer: Medicare Other

## 2015-03-27 ENCOUNTER — Other Ambulatory Visit (HOSPITAL_BASED_OUTPATIENT_CLINIC_OR_DEPARTMENT_OTHER): Payer: Medicare Other

## 2015-03-27 VITALS — BP 131/100 | HR 98 | Temp 98.2°F | Resp 19

## 2015-03-27 DIAGNOSIS — C50212 Malignant neoplasm of upper-inner quadrant of left female breast: Secondary | ICD-10-CM | POA: Diagnosis not present

## 2015-03-27 DIAGNOSIS — Z5112 Encounter for antineoplastic immunotherapy: Secondary | ICD-10-CM

## 2015-03-27 DIAGNOSIS — C7800 Secondary malignant neoplasm of unspecified lung: Principal | ICD-10-CM

## 2015-03-27 DIAGNOSIS — C7802 Secondary malignant neoplasm of left lung: Secondary | ICD-10-CM | POA: Diagnosis not present

## 2015-03-27 DIAGNOSIS — C50919 Malignant neoplasm of unspecified site of unspecified female breast: Secondary | ICD-10-CM

## 2015-03-27 LAB — CBC WITH DIFFERENTIAL/PLATELET
BASO%: 1 % (ref 0.0–2.0)
Basophils Absolute: 0.1 10*3/uL (ref 0.0–0.1)
EOS%: 1.9 % (ref 0.0–7.0)
Eosinophils Absolute: 0.2 10*3/uL (ref 0.0–0.5)
HCT: 43.3 % (ref 34.8–46.6)
HGB: 14.5 g/dL (ref 11.6–15.9)
LYMPH%: 33 % (ref 14.0–49.7)
MCH: 27.9 pg (ref 25.1–34.0)
MCHC: 33.4 g/dL (ref 31.5–36.0)
MCV: 83.6 fL (ref 79.5–101.0)
MONO#: 0.3 10*3/uL (ref 0.1–0.9)
MONO%: 4.3 % (ref 0.0–14.0)
NEUT#: 4.9 10*3/uL (ref 1.5–6.5)
NEUT%: 59.8 % (ref 38.4–76.8)
Platelets: 266 10*3/uL (ref 145–400)
RBC: 5.19 10*6/uL (ref 3.70–5.45)
RDW: 14.8 % — ABNORMAL HIGH (ref 11.2–14.5)
WBC: 8.1 10*3/uL (ref 3.9–10.3)
lymph#: 2.7 10*3/uL (ref 0.9–3.3)

## 2015-03-27 LAB — COMPREHENSIVE METABOLIC PANEL (CC13)
ALT: 53 U/L (ref 0–55)
AST: 27 U/L (ref 5–34)
Albumin: 3.1 g/dL — ABNORMAL LOW (ref 3.5–5.0)
Alkaline Phosphatase: 149 U/L (ref 40–150)
Anion Gap: 10 mEq/L (ref 3–11)
BUN: 13.8 mg/dL (ref 7.0–26.0)
CO2: 31 mEq/L — ABNORMAL HIGH (ref 22–29)
Calcium: 9.6 mg/dL (ref 8.4–10.4)
Chloride: 101 mEq/L (ref 98–109)
Creatinine: 0.8 mg/dL (ref 0.6–1.1)
EGFR: 78 mL/min/{1.73_m2} — ABNORMAL LOW (ref >90)
Glucose: 235 mg/dl — ABNORMAL HIGH (ref 70–140)
Potassium: 4.1 mEq/L (ref 3.5–5.1)
Sodium: 141 mEq/L (ref 136–145)
Total Bilirubin: 0.37 mg/dL (ref 0.20–1.20)
Total Protein: 7.1 g/dL (ref 6.4–8.3)

## 2015-03-27 MED ORDER — DIPHENHYDRAMINE HCL 25 MG PO CAPS
25.0000 mg | ORAL_CAPSULE | Freq: Once | ORAL | Status: AC
  Administered 2015-03-27: 25 mg via ORAL

## 2015-03-27 MED ORDER — LORAZEPAM 2 MG/ML IJ SOLN
0.5000 mg | Freq: Once | INTRAMUSCULAR | Status: AC
  Administered 2015-03-27: 0.5 mg via INTRAVENOUS

## 2015-03-27 MED ORDER — HEPARIN SOD (PORK) LOCK FLUSH 100 UNIT/ML IV SOLN
500.0000 [IU] | Freq: Once | INTRAVENOUS | Status: AC | PRN
  Administered 2015-03-27: 500 [IU]
  Filled 2015-03-27: qty 5

## 2015-03-27 MED ORDER — ACETAMINOPHEN 325 MG PO TABS
650.0000 mg | ORAL_TABLET | Freq: Once | ORAL | Status: AC
  Administered 2015-03-27: 650 mg via ORAL

## 2015-03-27 MED ORDER — DIPHENHYDRAMINE HCL 25 MG PO CAPS
ORAL_CAPSULE | ORAL | Status: AC
Start: 2015-03-27 — End: 2015-03-27
  Filled 2015-03-27: qty 1

## 2015-03-27 MED ORDER — ACETAMINOPHEN 325 MG PO TABS
ORAL_TABLET | ORAL | Status: AC
  Filled 2015-03-27: qty 2

## 2015-03-27 MED ORDER — SODIUM CHLORIDE 0.9 % IJ SOLN
10.0000 mL | INTRAMUSCULAR | Status: DC | PRN
  Administered 2015-03-27: 10 mL
  Filled 2015-03-27: qty 10

## 2015-03-27 MED ORDER — TRASTUZUMAB CHEMO INJECTION 440 MG
6.0000 mg/kg | Freq: Once | INTRAVENOUS | Status: AC
  Administered 2015-03-27: 903 mg via INTRAVENOUS
  Filled 2015-03-27: qty 43

## 2015-03-27 MED ORDER — SODIUM CHLORIDE 0.9 % IV SOLN
420.0000 mg | Freq: Once | INTRAVENOUS | Status: AC
  Administered 2015-03-27: 420 mg via INTRAVENOUS
  Filled 2015-03-27: qty 14

## 2015-03-27 MED ORDER — LORAZEPAM 2 MG/ML IJ SOLN
INTRAMUSCULAR | Status: AC
  Filled 2015-03-27: qty 1

## 2015-03-27 MED ORDER — SODIUM CHLORIDE 0.9 % IV SOLN
Freq: Once | INTRAVENOUS | Status: AC
  Administered 2015-03-27: 11:00:00 via INTRAVENOUS

## 2015-03-27 NOTE — Progress Notes (Signed)
Pt observed for 30 min. Post Perjeta without complications.

## 2015-03-27 NOTE — Patient Instructions (Signed)
[facility] Discharge Instructions for Patients Receiving Chemotherapy  Today you received the following chemotherapy agents Herceptin/Perjeta.  To help prevent nausea and vomiting after your treatment, we encourage you to take your nausea medication as directed.    If you develop nausea and vomiting that is not controlled by your nausea medication, call the clinic.   BELOW ARE SYMPTOMS THAT SHOULD BE REPORTED IMMEDIATELY:  *FEVER GREATER THAN 100.5 F  *CHILLS WITH OR WITHOUT FEVER  NAUSEA AND VOMITING THAT IS NOT CONTROLLED WITH YOUR NAUSEA MEDICATION  *UNUSUAL SHORTNESS OF BREATH  *UNUSUAL BRUISING OR BLEEDING  TENDERNESS IN MOUTH AND THROAT WITH OR WITHOUT PRESENCE OF ULCERS  *URINARY PROBLEMS  *BOWEL PROBLEMS  UNUSUAL RASH Items with * indicate a potential emergency and should be followed up as soon as possible.  Feel free to call the clinic you have any questions or concerns. The clinic phone number is [phone].  Please show the CHEMO ALERT CARD at check-in to the Emergency Department and triage nurse.   

## 2015-04-17 ENCOUNTER — Telehealth: Payer: Self-pay | Admitting: Oncology

## 2015-04-17 ENCOUNTER — Ambulatory Visit (HOSPITAL_BASED_OUTPATIENT_CLINIC_OR_DEPARTMENT_OTHER): Payer: Medicare Other | Admitting: Oncology

## 2015-04-17 ENCOUNTER — Other Ambulatory Visit (HOSPITAL_BASED_OUTPATIENT_CLINIC_OR_DEPARTMENT_OTHER): Payer: Medicare Other

## 2015-04-17 ENCOUNTER — Encounter: Payer: Self-pay | Admitting: Oncology

## 2015-04-17 ENCOUNTER — Ambulatory Visit (HOSPITAL_BASED_OUTPATIENT_CLINIC_OR_DEPARTMENT_OTHER): Payer: Medicare Other

## 2015-04-17 VITALS — BP 167/73 | HR 74 | Temp 99.2°F | Resp 18 | Ht 64.0 in | Wt 312.1 lb

## 2015-04-17 DIAGNOSIS — C50212 Malignant neoplasm of upper-inner quadrant of left female breast: Secondary | ICD-10-CM

## 2015-04-17 DIAGNOSIS — C7800 Secondary malignant neoplasm of unspecified lung: Secondary | ICD-10-CM | POA: Diagnosis not present

## 2015-04-17 DIAGNOSIS — C50919 Malignant neoplasm of unspecified site of unspecified female breast: Secondary | ICD-10-CM

## 2015-04-17 DIAGNOSIS — Z5112 Encounter for antineoplastic immunotherapy: Secondary | ICD-10-CM | POA: Diagnosis not present

## 2015-04-17 DIAGNOSIS — C782 Secondary malignant neoplasm of pleura: Secondary | ICD-10-CM

## 2015-04-17 LAB — COMPREHENSIVE METABOLIC PANEL (CC13)
ALT: 61 U/L — ABNORMAL HIGH (ref 0–55)
AST: 34 U/L (ref 5–34)
Albumin: 3.2 g/dL — ABNORMAL LOW (ref 3.5–5.0)
Alkaline Phosphatase: 154 U/L — ABNORMAL HIGH (ref 40–150)
Anion Gap: 10 mEq/L (ref 3–11)
BUN: 15.4 mg/dL (ref 7.0–26.0)
CO2: 29 mEq/L (ref 22–29)
Calcium: 10 mg/dL (ref 8.4–10.4)
Chloride: 102 mEq/L (ref 98–109)
Creatinine: 0.8 mg/dL (ref 0.6–1.1)
EGFR: 75 mL/min/{1.73_m2} — ABNORMAL LOW (ref >90)
Glucose: 230 mg/dl — ABNORMAL HIGH (ref 70–140)
Potassium: 4.2 mEq/L (ref 3.5–5.1)
Sodium: 140 mEq/L (ref 136–145)
Total Bilirubin: 0.4 mg/dL (ref 0.20–1.20)
Total Protein: 7.5 g/dL (ref 6.4–8.3)

## 2015-04-17 LAB — CBC WITH DIFFERENTIAL/PLATELET
BASO%: 0.3 % (ref 0.0–2.0)
Basophils Absolute: 0 10*3/uL (ref 0.0–0.1)
EOS%: 1.5 % (ref 0.0–7.0)
Eosinophils Absolute: 0.1 10*3/uL (ref 0.0–0.5)
HCT: 44.9 % (ref 34.8–46.6)
HGB: 14.8 g/dL (ref 11.6–15.9)
LYMPH%: 33.7 % (ref 14.0–49.7)
MCH: 28.1 pg (ref 25.1–34.0)
MCHC: 33 g/dL (ref 31.5–36.0)
MCV: 85.2 fL (ref 79.5–101.0)
MONO#: 0.3 10*3/uL (ref 0.1–0.9)
MONO%: 4.9 % (ref 0.0–14.0)
NEUT#: 4 10*3/uL (ref 1.5–6.5)
NEUT%: 59.6 % (ref 38.4–76.8)
Platelets: 241 10*3/uL (ref 145–400)
RBC: 5.27 10*6/uL (ref 3.70–5.45)
RDW: 14.3 % (ref 11.2–14.5)
WBC: 6.7 10*3/uL (ref 3.9–10.3)
lymph#: 2.3 10*3/uL (ref 0.9–3.3)

## 2015-04-17 MED ORDER — PERTUZUMAB CHEMO INJECTION 420 MG/14ML
420.0000 mg | Freq: Once | INTRAVENOUS | Status: AC
  Administered 2015-04-17: 420 mg via INTRAVENOUS
  Filled 2015-04-17: qty 14

## 2015-04-17 MED ORDER — TRASTUZUMAB CHEMO INJECTION 440 MG
6.0000 mg/kg | Freq: Once | INTRAVENOUS | Status: AC
  Administered 2015-04-17: 903 mg via INTRAVENOUS
  Filled 2015-04-17: qty 43

## 2015-04-17 MED ORDER — SODIUM CHLORIDE 0.9 % IV SOLN
Freq: Once | INTRAVENOUS | Status: AC
  Administered 2015-04-17: 11:00:00 via INTRAVENOUS

## 2015-04-17 MED ORDER — LORAZEPAM 2 MG/ML IJ SOLN
INTRAMUSCULAR | Status: AC
  Filled 2015-04-17: qty 1

## 2015-04-17 MED ORDER — SODIUM CHLORIDE 0.9 % IJ SOLN
10.0000 mL | INTRAMUSCULAR | Status: DC | PRN
  Administered 2015-04-17: 10 mL
  Filled 2015-04-17: qty 10

## 2015-04-17 MED ORDER — ACETAMINOPHEN 325 MG PO TABS
ORAL_TABLET | ORAL | Status: AC
  Filled 2015-04-17: qty 2

## 2015-04-17 MED ORDER — LORAZEPAM 2 MG/ML IJ SOLN
0.5000 mg | Freq: Once | INTRAMUSCULAR | Status: AC
  Administered 2015-04-17: 0.5 mg via INTRAVENOUS

## 2015-04-17 MED ORDER — DIPHENHYDRAMINE HCL 25 MG PO CAPS
ORAL_CAPSULE | ORAL | Status: AC
  Filled 2015-04-17: qty 1

## 2015-04-17 MED ORDER — DIPHENHYDRAMINE HCL 25 MG PO CAPS
25.0000 mg | ORAL_CAPSULE | Freq: Once | ORAL | Status: AC
  Administered 2015-04-17: 25 mg via ORAL

## 2015-04-17 MED ORDER — HEPARIN SOD (PORK) LOCK FLUSH 100 UNIT/ML IV SOLN
500.0000 [IU] | Freq: Once | INTRAVENOUS | Status: AC | PRN
  Administered 2015-04-17: 500 [IU]
  Filled 2015-04-17: qty 5

## 2015-04-17 MED ORDER — ACETAMINOPHEN 325 MG PO TABS
650.0000 mg | ORAL_TABLET | Freq: Once | ORAL | Status: AC
  Administered 2015-04-17: 650 mg via ORAL

## 2015-04-17 NOTE — Progress Notes (Signed)
[facility]yst[facility] Patient into the cancer center for routine visit.  Patient was given ePROs upon arrival to the cancer center.  The patient completed her ePROs without assistance.  The patient was thanked for her continued support of this clinical trial. Barb Lossie Kalp 04/17/2015 10:11 AM

## 2015-04-17 NOTE — Telephone Encounter (Signed)
Gave avs & calendar for September through November.

## 2015-04-17 NOTE — Patient Instructions (Signed)
[facility] Discharge Instructions for Patients Receiving Chemotherapy  Today you received the following chemotherapy agents herceptin/perjeta  To help prevent nausea and vomiting after your treatment, we encourage you to take your nausea medication as directed   If you develop nausea and vomiting that is not controlled by your nausea medication, call the clinic.   BELOW ARE SYMPTOMS THAT SHOULD BE REPORTED IMMEDIATELY:  *FEVER GREATER THAN 100.5 F  *CHILLS WITH OR WITHOUT FEVER  NAUSEA AND VOMITING THAT IS NOT CONTROLLED WITH YOUR NAUSEA MEDICATION  *UNUSUAL SHORTNESS OF BREATH  *UNUSUAL BRUISING OR BLEEDING  TENDERNESS IN MOUTH AND THROAT WITH OR WITHOUT PRESENCE OF ULCERS  *URINARY PROBLEMS  *BOWEL PROBLEMS  UNUSUAL RASH Items with * indicate a potential emergency and should be followed up as soon as possible.  Feel free to call the clinic you have any questions or concerns. The clinic phone number is [phone].  
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
## 2015-04-30 ENCOUNTER — Other Ambulatory Visit: Payer: Self-pay | Admitting: *Deleted

## 2015-04-30 MED ORDER — LOSARTAN POTASSIUM-HCTZ 100-12.5 MG PO TABS: 1.0000 | ORAL_TABLET | Freq: Every day | ORAL | Status: DC

## 2015-04-30 NOTE — Telephone Encounter (Signed)
Refill request for 90 day supply.  Allisson Schindel L, RN  

## 2015-05-08 ENCOUNTER — Ambulatory Visit (HOSPITAL_BASED_OUTPATIENT_CLINIC_OR_DEPARTMENT_OTHER): Payer: Medicare Other

## 2015-05-08 ENCOUNTER — Other Ambulatory Visit (HOSPITAL_BASED_OUTPATIENT_CLINIC_OR_DEPARTMENT_OTHER): Payer: Medicare Other

## 2015-05-08 VITALS — BP 157/70 | HR 70 | Temp 97.1°F | Resp 17

## 2015-05-08 DIAGNOSIS — C7800 Secondary malignant neoplasm of unspecified lung: Principal | ICD-10-CM

## 2015-05-08 DIAGNOSIS — Z5112 Encounter for antineoplastic immunotherapy: Secondary | ICD-10-CM | POA: Diagnosis not present

## 2015-05-08 DIAGNOSIS — C50212 Malignant neoplasm of upper-inner quadrant of left female breast: Secondary | ICD-10-CM

## 2015-05-08 DIAGNOSIS — C7802 Secondary malignant neoplasm of left lung: Secondary | ICD-10-CM | POA: Diagnosis not present

## 2015-05-08 DIAGNOSIS — C50919 Malignant neoplasm of unspecified site of unspecified female breast: Secondary | ICD-10-CM

## 2015-05-08 LAB — CBC WITH DIFFERENTIAL/PLATELET
BASO%: 0.8 % (ref 0.0–2.0)
Basophils Absolute: 0.1 10*3/uL (ref 0.0–0.1)
EOS%: 1.9 % (ref 0.0–7.0)
Eosinophils Absolute: 0.1 10*3/uL (ref 0.0–0.5)
HCT: 44.9 % (ref 34.8–46.6)
HGB: 14.6 g/dL (ref 11.6–15.9)
LYMPH%: 35.2 % (ref 14.0–49.7)
MCH: 27.4 pg (ref 25.1–34.0)
MCHC: 32.6 g/dL (ref 31.5–36.0)
MCV: 84.1 fL (ref 79.5–101.0)
MONO#: 0.4 10*3/uL (ref 0.1–0.9)
MONO%: 5.6 % (ref 0.0–14.0)
NEUT#: 4.1 10*3/uL (ref 1.5–6.5)
NEUT%: 56.5 % (ref 38.4–76.8)
Platelets: 265 10*3/uL (ref 145–400)
RBC: 5.34 10*6/uL (ref 3.70–5.45)
RDW: 14.6 % — ABNORMAL HIGH (ref 11.2–14.5)
WBC: 7.2 10*3/uL (ref 3.9–10.3)
lymph#: 2.6 10*3/uL (ref 0.9–3.3)

## 2015-05-08 LAB — COMPREHENSIVE METABOLIC PANEL (CC13)
ALT: 55 U/L (ref 0–55)
AST: 32 U/L (ref 5–34)
Albumin: 3.2 g/dL — ABNORMAL LOW (ref 3.5–5.0)
Alkaline Phosphatase: 140 U/L (ref 40–150)
Anion Gap: 8 mEq/L (ref 3–11)
BUN: 15.8 mg/dL (ref 7.0–26.0)
CO2: 31 mEq/L — ABNORMAL HIGH (ref 22–29)
Calcium: 9.8 mg/dL (ref 8.4–10.4)
Chloride: 103 mEq/L (ref 98–109)
Creatinine: 0.8 mg/dL (ref 0.6–1.1)
EGFR: 77 mL/min/{1.73_m2} — ABNORMAL LOW (ref >90)
Glucose: 193 mg/dl — ABNORMAL HIGH (ref 70–140)
Potassium: 4.3 mEq/L (ref 3.5–5.1)
Sodium: 142 mEq/L (ref 136–145)
Total Bilirubin: 0.39 mg/dL (ref 0.20–1.20)
Total Protein: 7.3 g/dL (ref 6.4–8.3)

## 2015-05-08 LAB — CANCER ANTIGEN 27.29: CA 27.29: 19 U/mL (ref 0–39)

## 2015-05-08 MED ORDER — SODIUM CHLORIDE 0.9 % IV SOLN
Freq: Once | INTRAVENOUS | Status: AC
  Administered 2015-05-08: 10:00:00 via INTRAVENOUS

## 2015-05-08 MED ORDER — DIPHENHYDRAMINE HCL 25 MG PO CAPS
ORAL_CAPSULE | ORAL | Status: AC
  Filled 2015-05-08: qty 1

## 2015-05-08 MED ORDER — TRASTUZUMAB CHEMO INJECTION 440 MG
6.0000 mg/kg | Freq: Once | INTRAVENOUS | Status: AC
  Administered 2015-05-08: 903 mg via INTRAVENOUS
  Filled 2015-05-08: qty 43

## 2015-05-08 MED ORDER — HEPARIN SOD (PORK) LOCK FLUSH 100 UNIT/ML IV SOLN
500.0000 [IU] | Freq: Once | INTRAVENOUS | Status: AC | PRN
  Administered 2015-05-08: 500 [IU]
  Filled 2015-05-08: qty 5

## 2015-05-08 MED ORDER — LORAZEPAM 2 MG/ML IJ SOLN
INTRAMUSCULAR | Status: AC
  Filled 2015-05-08: qty 1

## 2015-05-08 MED ORDER — ACETAMINOPHEN 325 MG PO TABS
ORAL_TABLET | ORAL | Status: AC
  Filled 2015-05-08: qty 2

## 2015-05-08 MED ORDER — ACETAMINOPHEN 325 MG PO TABS
650.0000 mg | ORAL_TABLET | Freq: Once | ORAL | Status: AC
  Administered 2015-05-08: 650 mg via ORAL

## 2015-05-08 MED ORDER — SODIUM CHLORIDE 0.9 % IV SOLN
420.0000 mg | Freq: Once | INTRAVENOUS | Status: AC
  Administered 2015-05-08: 420 mg via INTRAVENOUS
  Filled 2015-05-08: qty 14

## 2015-05-08 MED ORDER — DIPHENHYDRAMINE HCL 25 MG PO CAPS
25.0000 mg | ORAL_CAPSULE | Freq: Once | ORAL | Status: AC
  Administered 2015-05-08: 25 mg via ORAL

## 2015-05-08 MED ORDER — LORAZEPAM 2 MG/ML IJ SOLN
0.5000 mg | Freq: Once | INTRAMUSCULAR | Status: AC
  Administered 2015-05-08: 0.5 mg via INTRAVENOUS

## 2015-05-08 MED ORDER — SODIUM CHLORIDE 0.9 % IJ SOLN
10.0000 mL | INTRAMUSCULAR | Status: DC | PRN
  Administered 2015-05-08: 10 mL
  Filled 2015-05-08: qty 10

## 2015-05-08 NOTE — Patient Instructions (Signed)
[facility] Discharge Instructions for Patients Receiving Chemotherapy  Today you received the following chemotherapy agents herceptin, perjeta  To help prevent nausea and vomiting after your treatment, we encourage you to take your nausea medication   If you develop nausea and vomiting that is not controlled by your nausea medication, call the clinic.   BELOW ARE SYMPTOMS THAT SHOULD BE REPORTED IMMEDIATELY:  *FEVER GREATER THAN 100.5 F  *CHILLS WITH OR WITHOUT FEVER  NAUSEA AND VOMITING THAT IS NOT CONTROLLED WITH YOUR NAUSEA MEDICATION  *UNUSUAL SHORTNESS OF BREATH  *UNUSUAL BRUISING OR BLEEDING  TENDERNESS IN MOUTH AND THROAT WITH OR WITHOUT PRESENCE OF ULCERS  *URINARY PROBLEMS  *BOWEL PROBLEMS  UNUSUAL RASH Items with * indicate a potential emergency and should be followed up as soon as possible.  Feel free to call the clinic you have any questions or concerns. The clinic phone number is [phone].  Please show the [place] at check-in to the Emergency Department and triage nurse.

## 2015-05-13 ENCOUNTER — Other Ambulatory Visit: Payer: Self-pay | Admitting: Family Medicine

## 2015-05-29 ENCOUNTER — Other Ambulatory Visit: Payer: Self-pay | Admitting: Nurse Practitioner

## 2015-05-29 ENCOUNTER — Ambulatory Visit (HOSPITAL_BASED_OUTPATIENT_CLINIC_OR_DEPARTMENT_OTHER): Payer: Medicare Other

## 2015-05-29 ENCOUNTER — Other Ambulatory Visit (HOSPITAL_BASED_OUTPATIENT_CLINIC_OR_DEPARTMENT_OTHER): Payer: Medicare Other

## 2015-05-29 VITALS — BP 127/65 | HR 65 | Temp 96.0°F | Resp 20

## 2015-05-29 DIAGNOSIS — C50919 Malignant neoplasm of unspecified site of unspecified female breast: Secondary | ICD-10-CM

## 2015-05-29 DIAGNOSIS — C7802 Secondary malignant neoplasm of left lung: Secondary | ICD-10-CM

## 2015-05-29 DIAGNOSIS — Z5112 Encounter for antineoplastic immunotherapy: Secondary | ICD-10-CM | POA: Diagnosis not present

## 2015-05-29 DIAGNOSIS — C50212 Malignant neoplasm of upper-inner quadrant of left female breast: Secondary | ICD-10-CM

## 2015-05-29 DIAGNOSIS — C7800 Secondary malignant neoplasm of unspecified lung: Principal | ICD-10-CM

## 2015-05-29 LAB — COMPREHENSIVE METABOLIC PANEL (CC13)
ALT: 59 U/L — ABNORMAL HIGH (ref 0–55)
AST: 37 U/L — ABNORMAL HIGH (ref 5–34)
Albumin: 3.4 g/dL — ABNORMAL LOW (ref 3.5–5.0)
Alkaline Phosphatase: 152 U/L — ABNORMAL HIGH (ref 40–150)
Anion Gap: 10 mEq/L (ref 3–11)
BUN: 17.3 mg/dL (ref 7.0–26.0)
CO2: 27 mEq/L (ref 22–29)
Calcium: 10.2 mg/dL (ref 8.4–10.4)
Chloride: 101 mEq/L (ref 98–109)
Creatinine: 0.8 mg/dL (ref 0.6–1.1)
EGFR: 74 mL/min/{1.73_m2} — ABNORMAL LOW (ref >90)
Glucose: 254 mg/dl — ABNORMAL HIGH (ref 70–140)
Potassium: 4.2 mEq/L (ref 3.5–5.1)
Sodium: 139 mEq/L (ref 136–145)
Total Bilirubin: 0.39 mg/dL (ref 0.20–1.20)
Total Protein: 7.6 g/dL (ref 6.4–8.3)

## 2015-05-29 LAB — CBC WITH DIFFERENTIAL/PLATELET
BASO%: 0.2 % (ref 0.0–2.0)
Basophils Absolute: 0 10*3/uL (ref 0.0–0.1)
EOS%: 1.5 % (ref 0.0–7.0)
Eosinophils Absolute: 0.1 10*3/uL (ref 0.0–0.5)
HCT: 45.4 % (ref 34.8–46.6)
HGB: 15 g/dL (ref 11.6–15.9)
LYMPH%: 31 % (ref 14.0–49.7)
MCH: 28.2 pg (ref 25.1–34.0)
MCHC: 33 g/dL (ref 31.5–36.0)
MCV: 85.5 fL (ref 79.5–101.0)
MONO#: 0.4 10*3/uL (ref 0.1–0.9)
MONO%: 5.4 % (ref 0.0–14.0)
NEUT#: 4.1 10*3/uL (ref 1.5–6.5)
NEUT%: 61.9 % (ref 38.4–76.8)
Platelets: 254 10*3/uL (ref 145–400)
RBC: 5.31 10*6/uL (ref 3.70–5.45)
RDW: 14.2 % (ref 11.2–14.5)
WBC: 6.6 10*3/uL (ref 3.9–10.3)
lymph#: 2.1 10*3/uL (ref 0.9–3.3)

## 2015-05-29 MED ORDER — HEPARIN SOD (PORK) LOCK FLUSH 100 UNIT/ML IV SOLN
500.0000 [IU] | Freq: Once | INTRAVENOUS | Status: AC | PRN
  Administered 2015-05-29: 500 [IU]
  Filled 2015-05-29: qty 5

## 2015-05-29 MED ORDER — SODIUM CHLORIDE 0.9 % IV SOLN
Freq: Once | INTRAVENOUS | Status: AC
  Administered 2015-05-29: 11:00:00 via INTRAVENOUS

## 2015-05-29 MED ORDER — ACETAMINOPHEN 325 MG PO TABS
650.0000 mg | ORAL_TABLET | Freq: Once | ORAL | Status: AC
  Administered 2015-05-29: 650 mg via ORAL

## 2015-05-29 MED ORDER — DIPHENHYDRAMINE HCL 25 MG PO CAPS
ORAL_CAPSULE | ORAL | Status: AC
  Filled 2015-05-29: qty 1

## 2015-05-29 MED ORDER — SODIUM CHLORIDE 0.9 % IJ SOLN
10.0000 mL | INTRAMUSCULAR | Status: DC | PRN
  Administered 2015-05-29: 10 mL
  Filled 2015-05-29: qty 10

## 2015-05-29 MED ORDER — DIPHENHYDRAMINE HCL 25 MG PO CAPS
25.0000 mg | ORAL_CAPSULE | Freq: Once | ORAL | Status: AC
  Administered 2015-05-29: 25 mg via ORAL

## 2015-05-29 MED ORDER — SODIUM CHLORIDE 0.9 % IV SOLN
420.0000 mg | Freq: Once | INTRAVENOUS | Status: AC
  Administered 2015-05-29: 420 mg via INTRAVENOUS
  Filled 2015-05-29: qty 14

## 2015-05-29 MED ORDER — ACETAMINOPHEN 325 MG PO TABS
ORAL_TABLET | ORAL | Status: AC
  Filled 2015-05-29: qty 2

## 2015-05-29 MED ORDER — TRASTUZUMAB CHEMO INJECTION 440 MG
6.0000 mg/kg | Freq: Once | INTRAVENOUS | Status: AC
  Administered 2015-05-29: 903 mg via INTRAVENOUS
  Filled 2015-05-29: qty 43

## 2015-05-29 NOTE — Patient Instructions (Signed)
[facility] Discharge Instructions for Patients Receiving Chemotherapy  Today you received the following chemotherapy agents Herceptin and Perjeta  To help prevent nausea and vomiting after your treatment, we encourage you to take your nausea medication None ordered..Call MD office if needed.   If you develop nausea and vomiting that is not controlled by your nausea medication, call the clinic.   BELOW ARE SYMPTOMS THAT SHOULD BE REPORTED IMMEDIATELY:  *FEVER GREATER THAN 100.5 F  *CHILLS WITH OR WITHOUT FEVER  NAUSEA AND VOMITING THAT IS NOT CONTROLLED WITH YOUR NAUSEA MEDICATION  *UNUSUAL SHORTNESS OF BREATH  *UNUSUAL BRUISING OR BLEEDING  TENDERNESS IN MOUTH AND THROAT WITH OR WITHOUT PRESENCE OF ULCERS  *URINARY PROBLEMS  *BOWEL PROBLEMS  UNUSUAL RASH Items with * indicate a potential emergency and should be followed up as soon as possible.  Feel free to call the clinic you have any questions or concerns. The clinic phone number is [phone].  Please show the [place] at check-in to the Emergency Department and triage nurse.

## 2015-06-19 ENCOUNTER — Telehealth: Payer: Self-pay | Admitting: *Deleted

## 2015-06-19 ENCOUNTER — Other Ambulatory Visit: Payer: Self-pay | Admitting: Oncology

## 2015-06-19 ENCOUNTER — Other Ambulatory Visit: Payer: Self-pay | Admitting: *Deleted

## 2015-06-19 ENCOUNTER — Ambulatory Visit (HOSPITAL_BASED_OUTPATIENT_CLINIC_OR_DEPARTMENT_OTHER): Payer: Medicare Other

## 2015-06-19 ENCOUNTER — Other Ambulatory Visit (HOSPITAL_BASED_OUTPATIENT_CLINIC_OR_DEPARTMENT_OTHER): Payer: Medicare Other

## 2015-06-19 VITALS — BP 135/73 | HR 71 | Temp 98.2°F | Resp 18

## 2015-06-19 DIAGNOSIS — C50912 Malignant neoplasm of unspecified site of left female breast: Secondary | ICD-10-CM

## 2015-06-19 DIAGNOSIS — C7800 Secondary malignant neoplasm of unspecified lung: Secondary | ICD-10-CM

## 2015-06-19 DIAGNOSIS — C50919 Malignant neoplasm of unspecified site of unspecified female breast: Secondary | ICD-10-CM

## 2015-06-19 DIAGNOSIS — C50212 Malignant neoplasm of upper-inner quadrant of left female breast: Secondary | ICD-10-CM

## 2015-06-19 DIAGNOSIS — Z5112 Encounter for antineoplastic immunotherapy: Secondary | ICD-10-CM

## 2015-06-19 LAB — COMPREHENSIVE METABOLIC PANEL (CC13)
ALT: 61 U/L — ABNORMAL HIGH (ref 0–55)
AST: 36 U/L — ABNORMAL HIGH (ref 5–34)
Albumin: 3.1 g/dL — ABNORMAL LOW (ref 3.5–5.0)
Alkaline Phosphatase: 144 U/L (ref 40–150)
Anion Gap: 8 mEq/L (ref 3–11)
BUN: 19.4 mg/dL (ref 7.0–26.0)
CO2: 29 mEq/L (ref 22–29)
Calcium: 9.9 mg/dL (ref 8.4–10.4)
Chloride: 102 mEq/L (ref 98–109)
Creatinine: 0.8 mg/dL (ref 0.6–1.1)
EGFR: 73 mL/min/{1.73_m2} — ABNORMAL LOW (ref >90)
Glucose: 323 mg/dl — ABNORMAL HIGH (ref 70–140)
Potassium: 4.3 mEq/L (ref 3.5–5.1)
Sodium: 139 mEq/L (ref 136–145)
Total Bilirubin: <0.3 mg/dL (ref 0.20–1.20)
Total Protein: 7.2 g/dL (ref 6.4–8.3)

## 2015-06-19 LAB — CBC WITH DIFFERENTIAL/PLATELET
BASO%: 0.7 % (ref 0.0–2.0)
Basophils Absolute: 0 10*3/uL (ref 0.0–0.1)
EOS%: 2 % (ref 0.0–7.0)
Eosinophils Absolute: 0.1 10*3/uL (ref 0.0–0.5)
HCT: 44.3 % (ref 34.8–46.6)
HGB: 14.7 g/dL (ref 11.6–15.9)
LYMPH%: 32.8 % (ref 14.0–49.7)
MCH: 27.8 pg (ref 25.1–34.0)
MCHC: 33.2 g/dL (ref 31.5–36.0)
MCV: 83.6 fL (ref 79.5–101.0)
MONO#: 0.3 10*3/uL (ref 0.1–0.9)
MONO%: 5.1 % (ref 0.0–14.0)
NEUT#: 3.9 10*3/uL (ref 1.5–6.5)
NEUT%: 59.4 % (ref 38.4–76.8)
Platelets: 259 10*3/uL (ref 145–400)
RBC: 5.29 10*6/uL (ref 3.70–5.45)
RDW: 14.5 % (ref 11.2–14.5)
WBC: 6.5 10*3/uL (ref 3.9–10.3)
lymph#: 2.1 10*3/uL (ref 0.9–3.3)

## 2015-06-19 MED ORDER — LORAZEPAM 2 MG/ML IJ SOLN
INTRAMUSCULAR | Status: AC
  Filled 2015-06-19: qty 1

## 2015-06-19 MED ORDER — TRASTUZUMAB CHEMO INJECTION 440 MG
6.0000 mg/kg | Freq: Once | INTRAVENOUS | Status: AC
  Administered 2015-06-19: 903 mg via INTRAVENOUS
  Filled 2015-06-19: qty 43

## 2015-06-19 MED ORDER — HEPARIN SOD (PORK) LOCK FLUSH 100 UNIT/ML IV SOLN
500.0000 [IU] | Freq: Once | INTRAVENOUS | Status: AC | PRN
  Administered 2015-06-19: 500 [IU]
  Filled 2015-06-19: qty 5

## 2015-06-19 MED ORDER — ACETAMINOPHEN 325 MG PO TABS
650.0000 mg | ORAL_TABLET | Freq: Once | ORAL | Status: AC
  Administered 2015-06-19: 650 mg via ORAL

## 2015-06-19 MED ORDER — DIPHENHYDRAMINE HCL 25 MG PO CAPS
25.0000 mg | ORAL_CAPSULE | Freq: Once | ORAL | Status: AC
  Administered 2015-06-19: 25 mg via ORAL

## 2015-06-19 MED ORDER — PERTUZUMAB CHEMO INJECTION 420 MG/14ML
420.0000 mg | Freq: Once | INTRAVENOUS | Status: AC
  Administered 2015-06-19: 420 mg via INTRAVENOUS
  Filled 2015-06-19: qty 14

## 2015-06-19 MED ORDER — SODIUM CHLORIDE 0.9 % IV SOLN
Freq: Once | INTRAVENOUS | Status: AC
  Administered 2015-06-19: 11:00:00 via INTRAVENOUS

## 2015-06-19 MED ORDER — SODIUM CHLORIDE 0.9 % IJ SOLN
10.0000 mL | INTRAMUSCULAR | Status: DC | PRN
  Administered 2015-06-19: 10 mL
  Filled 2015-06-19: qty 10

## 2015-06-19 MED ORDER — LORAZEPAM 2 MG/ML IJ SOLN
0.5000 mg | Freq: Once | INTRAMUSCULAR | Status: AC
  Administered 2015-06-19: 0.5 mg via INTRAVENOUS

## 2015-06-19 MED ORDER — ACETAMINOPHEN 325 MG PO TABS
ORAL_TABLET | ORAL | Status: AC
  Filled 2015-06-19: qty 2

## 2015-06-19 MED ORDER — DIPHENHYDRAMINE HCL 25 MG PO CAPS
ORAL_CAPSULE | ORAL | Status: AC
  Filled 2015-06-19: qty 1

## 2015-06-19 NOTE — Patient Instructions (Signed)
[facility] Discharge Instructions for Patients Receiving Chemotherapy  Today you received the following chemotherapy agents Herceptin/Perjeta.  To help prevent nausea and vomiting after your treatment, we encourage you to take your nausea medication as directed.    If you develop nausea and vomiting that is not controlled by your nausea medication, call the clinic.   BELOW ARE SYMPTOMS THAT SHOULD BE REPORTED IMMEDIATELY:  *FEVER GREATER THAN 100.5 F  *CHILLS WITH OR WITHOUT FEVER  NAUSEA AND VOMITING THAT IS NOT CONTROLLED WITH YOUR NAUSEA MEDICATION  *UNUSUAL SHORTNESS OF BREATH  *UNUSUAL BRUISING OR BLEEDING  TENDERNESS IN MOUTH AND THROAT WITH OR WITHOUT PRESENCE OF ULCERS  *URINARY PROBLEMS  *BOWEL PROBLEMS  UNUSUAL RASH Items with * indicate a potential emergency and should be followed up as soon as possible.  Feel free to call the clinic you have any questions or concerns. The clinic phone number is [phone].  Please show the CHEMO ALERT CARD at check-in to the Emergency Department and triage nurse.   

## 2015-06-19 NOTE — Telephone Encounter (Signed)
Voicemail from patient.  Reviewing history of pain medications. "Prefers Oxycontin for pain."

## 2015-06-19 NOTE — Telephone Encounter (Signed)
Per treatment room visit today pt states need for pain medication.  Pt unable to stay post treatment to speak with RN directly for review of prior medications - requested RN to contact pt at home.  This RN attempted to call pt- obtained identified VM- message left to return call per above including prior medications used and outcomes.  Noted pt had previously been on ms contin and more recently oxycodone.  Pt now has percocet under allergy list with no reaction associated.  This RN requested return call to discuss the above further.

## 2015-06-20 ENCOUNTER — Telehealth: Payer: Self-pay | Admitting: *Deleted

## 2015-06-20 ENCOUNTER — Ambulatory Visit (HOSPITAL_COMMUNITY): Payer: Medicare Other

## 2015-06-20 LAB — CANCER ANTIGEN 27.29: CA 27.29: 18 U/mL (ref 0–39)

## 2015-06-20 NOTE — Telephone Encounter (Signed)
This RN received message from pt stating in detail history of medication used in the past.  She verified per message that " when given percocet it made me very nauseated ".  This RN returned call and again obtained identified VM.  Message left informing pt prescriptions can be obtained - to call this RN back.

## 2015-06-21 ENCOUNTER — Telehealth: Payer: Self-pay

## 2015-06-21 ENCOUNTER — Other Ambulatory Visit: Payer: Self-pay | Admitting: *Deleted

## 2015-06-21 MED ORDER — OXYCODONE HCL ER 10 MG PO T12A: 10.0000 mg | EXTENDED_RELEASE_TABLET | Freq: Two times a day (BID) | ORAL | Status: DC

## 2015-06-21 MED ORDER — TRAMADOL HCL 50 MG PO TABS: 50.0000 mg | ORAL_TABLET | Freq: Four times a day (QID) | ORAL | Status: DC | PRN

## 2015-06-21 NOTE — Telephone Encounter (Signed)
Pt tried to call Val back. Re: her oxycontin refill. She will be able to have someone pick up the Rx Tomorrow. Leave a message when ready for pickup.

## 2015-07-03 ENCOUNTER — Ambulatory Visit (HOSPITAL_COMMUNITY)
Admission: RE | Admit: 2015-07-03 | Discharge: 2015-07-03 | Disposition: A | Discharge status: Home / Self Care | Payer: Medicare Other | Source: Ambulatory Visit | Attending: Oncology | Admitting: Oncology

## 2015-07-03 DIAGNOSIS — E119 Type 2 diabetes mellitus without complications: Secondary | ICD-10-CM | POA: Insufficient documentation

## 2015-07-03 DIAGNOSIS — I10 Essential (primary) hypertension: Secondary | ICD-10-CM | POA: Insufficient documentation

## 2015-07-03 DIAGNOSIS — C50212 Malignant neoplasm of upper-inner quadrant of left female breast: Secondary | ICD-10-CM | POA: Diagnosis not present

## 2015-07-03 DIAGNOSIS — Z87891 Personal history of nicotine dependence: Secondary | ICD-10-CM | POA: Diagnosis not present

## 2015-07-03 DIAGNOSIS — Z9221 Personal history of antineoplastic chemotherapy: Secondary | ICD-10-CM | POA: Insufficient documentation

## 2015-07-03 DIAGNOSIS — Z0189 Encounter for other specified special examinations: Secondary | ICD-10-CM | POA: Diagnosis present

## 2015-07-03 DIAGNOSIS — C50919 Malignant neoplasm of unspecified site of unspecified female breast: Secondary | ICD-10-CM

## 2015-07-03 DIAGNOSIS — C7800 Secondary malignant neoplasm of unspecified lung: Secondary | ICD-10-CM | POA: Insufficient documentation

## 2015-07-03 MED ORDER — PERFLUTREN LIPID MICROSPHERE
INTRAVENOUS | Status: AC
  Filled 2015-07-03: qty 10

## 2015-07-03 NOTE — Progress Notes (Signed)
*  PRELIMINARY RESULTS* Echocardiogram 2D Echocardiogram has been performed.  [name] 07/03/2015, 12:51 PM

## 2015-07-05 ENCOUNTER — Encounter (HOSPITAL_COMMUNITY): Payer: Self-pay

## 2015-07-05 ENCOUNTER — Ambulatory Visit (HOSPITAL_COMMUNITY)
Admission: RE | Admit: 2015-07-05 | Discharge: 2015-07-05 | Disposition: A | Discharge status: Home / Self Care | Payer: Medicare Other | Source: Ambulatory Visit | Attending: Oncology | Admitting: Oncology

## 2015-07-05 DIAGNOSIS — C7800 Secondary malignant neoplasm of unspecified lung: Secondary | ICD-10-CM | POA: Insufficient documentation

## 2015-07-05 DIAGNOSIS — C50919 Malignant neoplasm of unspecified site of unspecified female breast: Secondary | ICD-10-CM

## 2015-07-05 DIAGNOSIS — C50212 Malignant neoplasm of upper-inner quadrant of left female breast: Secondary | ICD-10-CM | POA: Diagnosis present

## 2015-07-05 MED ORDER — IOHEXOL 300 MG/ML  SOLN
75.0000 mL | Freq: Once | INTRAMUSCULAR | Status: AC | PRN
  Administered 2015-07-05: 75 mL via INTRAVENOUS

## 2015-07-09 ENCOUNTER — Ambulatory Visit: Payer: Medicare Other | Admitting: Nurse Practitioner

## 2015-07-09 ENCOUNTER — Other Ambulatory Visit: Payer: Self-pay | Admitting: Family Medicine

## 2015-07-09 ENCOUNTER — Other Ambulatory Visit: Payer: Medicare Other

## 2015-07-10 ENCOUNTER — Other Ambulatory Visit (HOSPITAL_BASED_OUTPATIENT_CLINIC_OR_DEPARTMENT_OTHER): Payer: Medicare Other

## 2015-07-10 ENCOUNTER — Encounter: Payer: Self-pay | Admitting: Oncology

## 2015-07-10 ENCOUNTER — Ambulatory Visit (HOSPITAL_BASED_OUTPATIENT_CLINIC_OR_DEPARTMENT_OTHER): Payer: Medicare Other

## 2015-07-10 VITALS — BP 161/69 | HR 68 | Temp 98.1°F | Resp 16

## 2015-07-10 DIAGNOSIS — C50412 Malignant neoplasm of upper-outer quadrant of left female breast: Secondary | ICD-10-CM | POA: Diagnosis not present

## 2015-07-10 DIAGNOSIS — Z5112 Encounter for antineoplastic immunotherapy: Secondary | ICD-10-CM

## 2015-07-10 DIAGNOSIS — C50912 Malignant neoplasm of unspecified site of left female breast: Secondary | ICD-10-CM

## 2015-07-10 DIAGNOSIS — C7800 Secondary malignant neoplasm of unspecified lung: Principal | ICD-10-CM

## 2015-07-10 DIAGNOSIS — C50212 Malignant neoplasm of upper-inner quadrant of left female breast: Secondary | ICD-10-CM

## 2015-07-10 DIAGNOSIS — C50919 Malignant neoplasm of unspecified site of unspecified female breast: Secondary | ICD-10-CM

## 2015-07-10 LAB — CBC WITH DIFFERENTIAL/PLATELET
BASO%: 0.8 % (ref 0.0–2.0)
Basophils Absolute: 0.1 10*3/uL (ref 0.0–0.1)
EOS%: 1.6 % (ref 0.0–7.0)
Eosinophils Absolute: 0.1 10*3/uL (ref 0.0–0.5)
HCT: 46.8 % — ABNORMAL HIGH (ref 34.8–46.6)
HGB: 15.2 g/dL (ref 11.6–15.9)
LYMPH%: 33.1 % (ref 14.0–49.7)
MCH: 27.9 pg (ref 25.1–34.0)
MCHC: 32.5 g/dL (ref 31.5–36.0)
MCV: 86.1 fL (ref 79.5–101.0)
MONO#: 0.3 10*3/uL (ref 0.1–0.9)
MONO%: 4.7 % (ref 0.0–14.0)
NEUT#: 3.8 10*3/uL (ref 1.5–6.5)
NEUT%: 59.8 % (ref 38.4–76.8)
Platelets: 229 10*3/uL (ref 145–400)
RBC: 5.44 10*6/uL (ref 3.70–5.45)
RDW: 14.7 % — ABNORMAL HIGH (ref 11.2–14.5)
WBC: 6.3 10*3/uL (ref 3.9–10.3)
lymph#: 2.1 10*3/uL (ref 0.9–3.3)

## 2015-07-10 LAB — COMPREHENSIVE METABOLIC PANEL (CC13)
ALT: 67 U/L — ABNORMAL HIGH (ref 0–55)
AST: 36 U/L — ABNORMAL HIGH (ref 5–34)
Albumin: 3.3 g/dL — ABNORMAL LOW (ref 3.5–5.0)
Alkaline Phosphatase: 137 U/L (ref 40–150)
Anion Gap: 11 mEq/L (ref 3–11)
BUN: 15.4 mg/dL (ref 7.0–26.0)
CO2: 27 mEq/L (ref 22–29)
Calcium: 9.9 mg/dL (ref 8.4–10.4)
Chloride: 101 mEq/L (ref 98–109)
Creatinine: 0.9 mg/dL (ref 0.6–1.1)
EGFR: 69 mL/min/{1.73_m2} — ABNORMAL LOW (ref >90)
Glucose: 321 mg/dl — ABNORMAL HIGH (ref 70–140)
Potassium: 4.1 mEq/L (ref 3.5–5.1)
Sodium: 139 mEq/L (ref 136–145)
Total Bilirubin: 0.33 mg/dL (ref 0.20–1.20)
Total Protein: 7.4 g/dL (ref 6.4–8.3)

## 2015-07-10 MED ORDER — DIPHENHYDRAMINE HCL 25 MG PO CAPS
25.0000 mg | ORAL_CAPSULE | Freq: Once | ORAL | Status: AC
  Administered 2015-07-10: 25 mg via ORAL

## 2015-07-10 MED ORDER — SODIUM CHLORIDE 0.9 % IV SOLN
Freq: Once | INTRAVENOUS | Status: AC
  Administered 2015-07-10: 10:00:00 via INTRAVENOUS

## 2015-07-10 MED ORDER — DIPHENHYDRAMINE HCL 25 MG PO CAPS
ORAL_CAPSULE | ORAL | Status: AC
  Filled 2015-07-10: qty 1

## 2015-07-10 MED ORDER — ACETAMINOPHEN 325 MG PO TABS
ORAL_TABLET | ORAL | Status: AC
  Filled 2015-07-10: qty 2

## 2015-07-10 MED ORDER — ACETAMINOPHEN 325 MG PO TABS
650.0000 mg | ORAL_TABLET | Freq: Once | ORAL | Status: AC
Start: 2015-07-10 — End: 2015-07-10
  Administered 2015-07-10: 650 mg via ORAL

## 2015-07-10 MED ORDER — TRASTUZUMAB CHEMO INJECTION 440 MG
6.0000 mg/kg | Freq: Once | INTRAVENOUS | Status: AC
  Administered 2015-07-10: 903 mg via INTRAVENOUS
  Filled 2015-07-10: qty 43

## 2015-07-10 MED ORDER — LORAZEPAM 2 MG/ML IJ SOLN: 0.5000 mg | Freq: Once | INTRAMUSCULAR | Status: DC

## 2015-07-10 MED ORDER — SODIUM CHLORIDE 0.9 % IV SOLN
420.0000 mg | Freq: Once | INTRAVENOUS | Status: AC
  Administered 2015-07-10: 420 mg via INTRAVENOUS
  Filled 2015-07-10: qty 14

## 2015-07-10 NOTE — Progress Notes (Signed)
07/10/15 - SysTHERs study ePRO's - Patient into cancer center this morning for lab and chemo.  I met with the patient before her chemo apt to do ePRO's.  The patient competed her ePRO's without any assistance.  I thanked the patient for her continued support in this clinical trial. Remer Macho 07/10/15 - 9:45 am

## 2015-07-10 NOTE — Patient Instructions (Signed)
[facility] Discharge Instructions for Patients Receiving Chemotherapy  Today you received the following chemotherapy agents Herceptin/Perjeta.  To help prevent nausea and vomiting after your treatment, we encourage you to take your nausea medication as directed.    If you develop nausea and vomiting that is not controlled by your nausea medication, call the clinic.   BELOW ARE SYMPTOMS THAT SHOULD BE REPORTED IMMEDIATELY:  *FEVER GREATER THAN 100.5 F  *CHILLS WITH OR WITHOUT FEVER  NAUSEA AND VOMITING THAT IS NOT CONTROLLED WITH YOUR NAUSEA MEDICATION  *UNUSUAL SHORTNESS OF BREATH  *UNUSUAL BRUISING OR BLEEDING  TENDERNESS IN MOUTH AND THROAT WITH OR WITHOUT PRESENCE OF ULCERS  *URINARY PROBLEMS  *BOWEL PROBLEMS  UNUSUAL RASH Items with * indicate a potential emergency and should be followed up as soon as possible.  Feel free to call the clinic you have any questions or concerns. The clinic phone number is [phone].  Please show the CHEMO ALERT CARD at check-in to the Emergency Department and triage nurse.   

## 2015-07-10 NOTE — Progress Notes (Signed)
12:45 - Port flushed with saline 9% 10 ml and Heparin Flush 100 u/ml  500 u.  Patient tolerated treatment well without complaint.

## 2015-07-14 ENCOUNTER — Other Ambulatory Visit: Payer: Self-pay | Admitting: Nurse Practitioner

## 2015-07-17 ENCOUNTER — Encounter: Payer: Self-pay | Admitting: Nurse Practitioner

## 2015-07-17 ENCOUNTER — Encounter: Payer: Self-pay | Admitting: Oncology

## 2015-07-17 ENCOUNTER — Ambulatory Visit (HOSPITAL_BASED_OUTPATIENT_CLINIC_OR_DEPARTMENT_OTHER): Payer: Medicare Other | Admitting: Nurse Practitioner

## 2015-07-17 ENCOUNTER — Telehealth: Payer: Self-pay | Admitting: Oncology

## 2015-07-17 VITALS — BP 158/93 | HR 68 | Temp 97.9°F | Resp 18 | Ht 64.0 in | Wt 312.6 lb

## 2015-07-17 DIAGNOSIS — C50912 Malignant neoplasm of unspecified site of left female breast: Secondary | ICD-10-CM

## 2015-07-17 DIAGNOSIS — C7802 Secondary malignant neoplasm of left lung: Secondary | ICD-10-CM | POA: Diagnosis not present

## 2015-07-17 DIAGNOSIS — C50412 Malignant neoplasm of upper-outer quadrant of left female breast: Secondary | ICD-10-CM | POA: Diagnosis not present

## 2015-07-17 NOTE — Telephone Encounter (Signed)
Gave patient avs report and appointments for December thru March.
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
## 2015-07-17 NOTE — Progress Notes (Signed)
Received call from Natro(RN) stating pt needs assistance for medication(Oxycontin) not covered by insurance. I advised her I would meet with patient today after her appointment. Met with patient in person to introduce myself as Financial Advocate. Contacted insurance company to check to see if any portion of mediation would be covered. Gave pt phone to verify her info and permission to speak with me. Spoke with [email or web address] who states that is not an approved for her.Pt states they told her Morphine was but she does not want to take that and has taken Oxycontin before. Nykka states that a prior auth would need to be started by the physician to see if that is something that could be added to her formulary of approved medications for her. Advised pt I would pass that information on to Natro  to get started on this process. In the meanwhile, approved pt for $1,000 [employer] which will help with her medication expenses. Pt has copy of approval letter and expenses it covers. Pt will take her rx to the pharmacy today to have filled using funds from grant.      °

## 2015-07-18 ENCOUNTER — Other Ambulatory Visit: Payer: Self-pay | Admitting: Family Medicine

## 2015-07-31 ENCOUNTER — Ambulatory Visit (HOSPITAL_BASED_OUTPATIENT_CLINIC_OR_DEPARTMENT_OTHER): Payer: Medicare Other

## 2015-07-31 ENCOUNTER — Other Ambulatory Visit (HOSPITAL_BASED_OUTPATIENT_CLINIC_OR_DEPARTMENT_OTHER): Payer: Medicare Other

## 2015-07-31 VITALS — BP 140/68 | HR 70 | Temp 98.4°F | Resp 18

## 2015-07-31 DIAGNOSIS — C7800 Secondary malignant neoplasm of unspecified lung: Principal | ICD-10-CM

## 2015-07-31 DIAGNOSIS — C50912 Malignant neoplasm of unspecified site of left female breast: Secondary | ICD-10-CM

## 2015-07-31 DIAGNOSIS — C782 Secondary malignant neoplasm of pleura: Secondary | ICD-10-CM | POA: Diagnosis not present

## 2015-07-31 DIAGNOSIS — Z5112 Encounter for antineoplastic immunotherapy: Secondary | ICD-10-CM

## 2015-07-31 DIAGNOSIS — C50919 Malignant neoplasm of unspecified site of unspecified female breast: Secondary | ICD-10-CM

## 2015-07-31 DIAGNOSIS — C50212 Malignant neoplasm of upper-inner quadrant of left female breast: Secondary | ICD-10-CM

## 2015-07-31 LAB — COMPREHENSIVE METABOLIC PANEL
ALT: 55 U/L (ref 0–55)
AST: 35 U/L — ABNORMAL HIGH (ref 5–34)
Albumin: 3.1 g/dL — ABNORMAL LOW (ref 3.5–5.0)
Alkaline Phosphatase: 149 U/L (ref 40–150)
Anion Gap: 8 mEq/L (ref 3–11)
BUN: 13.2 mg/dL (ref 7.0–26.0)
CO2: 28 mEq/L (ref 22–29)
Calcium: 9.8 mg/dL (ref 8.4–10.4)
Chloride: 102 mEq/L (ref 98–109)
Creatinine: 0.9 mg/dL (ref 0.6–1.1)
EGFR: 68 mL/min/{1.73_m2} — ABNORMAL LOW (ref >90)
Glucose: 248 mg/dl — ABNORMAL HIGH (ref 70–140)
Potassium: 4.2 mEq/L (ref 3.5–5.1)
Sodium: 139 mEq/L (ref 136–145)
Total Bilirubin: 0.41 mg/dL (ref 0.20–1.20)
Total Protein: 7.3 g/dL (ref 6.4–8.3)

## 2015-07-31 LAB — CBC WITH DIFFERENTIAL/PLATELET
BASO%: 0.8 % (ref 0.0–2.0)
Basophils Absolute: 0.1 10*3/uL (ref 0.0–0.1)
EOS%: 1.4 % (ref 0.0–7.0)
Eosinophils Absolute: 0.1 10*3/uL (ref 0.0–0.5)
HCT: 46.3 % (ref 34.8–46.6)
HGB: 15 g/dL (ref 11.6–15.9)
LYMPH%: 29.2 % (ref 14.0–49.7)
MCH: 27.3 pg (ref 25.1–34.0)
MCHC: 32.4 g/dL (ref 31.5–36.0)
MCV: 84.3 fL (ref 79.5–101.0)
MONO#: 0.4 10*3/uL (ref 0.1–0.9)
MONO%: 5.2 % (ref 0.0–14.0)
NEUT#: 4.7 10*3/uL (ref 1.5–6.5)
NEUT%: 63.4 % (ref 38.4–76.8)
Platelets: 249 10*3/uL (ref 145–400)
RBC: 5.49 10*6/uL — ABNORMAL HIGH (ref 3.70–5.45)
RDW: 14.6 % — ABNORMAL HIGH (ref 11.2–14.5)
WBC: 7.3 10*3/uL (ref 3.9–10.3)
lymph#: 2.1 10*3/uL (ref 0.9–3.3)

## 2015-07-31 MED ORDER — ACETAMINOPHEN 325 MG PO TABS
650.0000 mg | ORAL_TABLET | Freq: Once | ORAL | Status: AC
  Administered 2015-07-31: 650 mg via ORAL

## 2015-07-31 MED ORDER — DIPHENHYDRAMINE HCL 25 MG PO CAPS
25.0000 mg | ORAL_CAPSULE | Freq: Once | ORAL | Status: AC
  Administered 2015-07-31: 25 mg via ORAL

## 2015-07-31 MED ORDER — TRASTUZUMAB CHEMO INJECTION 440 MG
6.0000 mg/kg | Freq: Once | INTRAVENOUS | Status: AC
  Administered 2015-07-31: 903 mg via INTRAVENOUS
  Filled 2015-07-31: qty 43

## 2015-07-31 MED ORDER — SODIUM CHLORIDE 0.9 % IJ SOLN
10.0000 mL | INTRAMUSCULAR | Status: DC | PRN
  Administered 2015-07-31: 10 mL
  Filled 2015-07-31: qty 10

## 2015-07-31 MED ORDER — LORAZEPAM 2 MG/ML IJ SOLN
INTRAMUSCULAR | Status: AC
  Filled 2015-07-31: qty 1

## 2015-07-31 MED ORDER — DIPHENHYDRAMINE HCL 25 MG PO CAPS
ORAL_CAPSULE | ORAL | Status: AC
  Filled 2015-07-31: qty 1

## 2015-07-31 MED ORDER — SODIUM CHLORIDE 0.9 % IV SOLN
Freq: Once | INTRAVENOUS | Status: AC
  Administered 2015-07-31: 13:00:00 via INTRAVENOUS

## 2015-07-31 MED ORDER — SODIUM CHLORIDE 0.9 % IV SOLN
420.0000 mg | Freq: Once | INTRAVENOUS | Status: AC
  Administered 2015-07-31: 420 mg via INTRAVENOUS
  Filled 2015-07-31: qty 14

## 2015-07-31 MED ORDER — ACETAMINOPHEN 325 MG PO TABS
ORAL_TABLET | ORAL | Status: AC
  Filled 2015-07-31: qty 2

## 2015-07-31 MED ORDER — HEPARIN SOD (PORK) LOCK FLUSH 100 UNIT/ML IV SOLN
500.0000 [IU] | Freq: Once | INTRAVENOUS | Status: AC | PRN
  Administered 2015-07-31: 500 [IU]
  Filled 2015-07-31: qty 5

## 2015-07-31 MED ORDER — LORAZEPAM 2 MG/ML IJ SOLN
0.5000 mg | Freq: Once | INTRAMUSCULAR | Status: AC
  Administered 2015-07-31: 0.5 mg via INTRAVENOUS

## 2015-07-31 NOTE — Patient Instructions (Signed)
[facility] Discharge Instructions for Patients Receiving Chemotherapy  Today you received the following chemotherapy agents Herceptin/Perjeta.  To help prevent nausea and vomiting after your treatment, we encourage you to take your nausea medication as directed.    If you develop nausea and vomiting that is not controlled by your nausea medication, call the clinic.   BELOW ARE SYMPTOMS THAT SHOULD BE REPORTED IMMEDIATELY:  *FEVER GREATER THAN 100.5 F  *CHILLS WITH OR WITHOUT FEVER  NAUSEA AND VOMITING THAT IS NOT CONTROLLED WITH YOUR NAUSEA MEDICATION  *UNUSUAL SHORTNESS OF BREATH  *UNUSUAL BRUISING OR BLEEDING  TENDERNESS IN MOUTH AND THROAT WITH OR WITHOUT PRESENCE OF ULCERS  *URINARY PROBLEMS  *BOWEL PROBLEMS  UNUSUAL RASH Items with * indicate a potential emergency and should be followed up as soon as possible.  Feel free to call the clinic you have any questions or concerns. The clinic phone number is [phone].  Please show the CHEMO ALERT CARD at check-in to the Emergency Department and triage nurse.   

## 2015-08-09 ENCOUNTER — Telehealth: Payer: Self-pay | Admitting: *Deleted

## 2015-08-09 NOTE — Telephone Encounter (Signed)
I have called and moved appts from 1/3 to 1/4 due to holiday. Patient agreeable to move appts. Patient given new time/date. Calendar mailed.    JMW

## 2015-08-20 ENCOUNTER — Other Ambulatory Visit: Payer: Self-pay | Admitting: Oncology

## 2015-08-21 ENCOUNTER — Ambulatory Visit: Payer: Medicare Other

## 2015-08-21 ENCOUNTER — Other Ambulatory Visit: Payer: Medicare Other

## 2015-08-22 ENCOUNTER — Other Ambulatory Visit (HOSPITAL_BASED_OUTPATIENT_CLINIC_OR_DEPARTMENT_OTHER): Payer: Medicare Other

## 2015-08-22 ENCOUNTER — Ambulatory Visit (HOSPITAL_BASED_OUTPATIENT_CLINIC_OR_DEPARTMENT_OTHER): Payer: Medicare Other

## 2015-08-22 ENCOUNTER — Other Ambulatory Visit: Payer: Self-pay | Admitting: *Deleted

## 2015-08-22 VITALS — BP 143/100 | HR 64 | Temp 97.6°F | Resp 18

## 2015-08-22 DIAGNOSIS — Z5112 Encounter for antineoplastic immunotherapy: Secondary | ICD-10-CM

## 2015-08-22 DIAGNOSIS — C50412 Malignant neoplasm of upper-outer quadrant of left female breast: Secondary | ICD-10-CM | POA: Diagnosis not present

## 2015-08-22 DIAGNOSIS — C50919 Malignant neoplasm of unspecified site of unspecified female breast: Secondary | ICD-10-CM

## 2015-08-22 DIAGNOSIS — C50212 Malignant neoplasm of upper-inner quadrant of left female breast: Secondary | ICD-10-CM

## 2015-08-22 DIAGNOSIS — C782 Secondary malignant neoplasm of pleura: Secondary | ICD-10-CM

## 2015-08-22 DIAGNOSIS — C7800 Secondary malignant neoplasm of unspecified lung: Principal | ICD-10-CM

## 2015-08-22 LAB — CBC WITH DIFFERENTIAL/PLATELET
BASO%: 0.5 % (ref 0.0–2.0)
Basophils Absolute: 0 10*3/uL (ref 0.0–0.1)
EOS%: 1.4 % (ref 0.0–7.0)
Eosinophils Absolute: 0.1 10*3/uL (ref 0.0–0.5)
HCT: 49.2 % — ABNORMAL HIGH (ref 34.8–46.6)
HGB: 15.9 g/dL (ref 11.6–15.9)
LYMPH%: 29.9 % (ref 14.0–49.7)
MCH: 27.5 pg (ref 25.1–34.0)
MCHC: 32.3 g/dL (ref 31.5–36.0)
MCV: 85 fL (ref 79.5–101.0)
MONO#: 0.5 10*3/uL (ref 0.1–0.9)
MONO%: 6.4 % (ref 0.0–14.0)
NEUT#: 4.6 10*3/uL (ref 1.5–6.5)
NEUT%: 61.8 % (ref 38.4–76.8)
Platelets: 247 10*3/uL (ref 145–400)
RBC: 5.78 10*6/uL — ABNORMAL HIGH (ref 3.70–5.45)
RDW: 14.4 % (ref 11.2–14.5)
WBC: 7.5 10*3/uL (ref 3.9–10.3)
lymph#: 2.2 10*3/uL (ref 0.9–3.3)

## 2015-08-22 LAB — CANCER ANTIGEN 27.29: CA 27.29: 25 U/mL (ref 0–39)

## 2015-08-22 LAB — COMPREHENSIVE METABOLIC PANEL
ALT: 68 U/L — ABNORMAL HIGH (ref 0–55)
AST: 38 U/L — ABNORMAL HIGH (ref 5–34)
Albumin: 3.4 g/dL — ABNORMAL LOW (ref 3.5–5.0)
Alkaline Phosphatase: 153 U/L — ABNORMAL HIGH (ref 40–150)
Anion Gap: 10 mEq/L (ref 3–11)
BUN: 18.4 mg/dL (ref 7.0–26.0)
CO2: 32 mEq/L — ABNORMAL HIGH (ref 22–29)
Calcium: 10.1 mg/dL (ref 8.4–10.4)
Chloride: 100 mEq/L (ref 98–109)
Creatinine: 0.9 mg/dL (ref 0.6–1.1)
EGFR: 68 mL/min/{1.73_m2} — ABNORMAL LOW (ref >90)
Glucose: 201 mg/dl — ABNORMAL HIGH (ref 70–140)
Potassium: 4.3 mEq/L (ref 3.5–5.1)
Sodium: 141 mEq/L (ref 136–145)
Total Bilirubin: 0.37 mg/dL (ref 0.20–1.20)
Total Protein: 8.1 g/dL (ref 6.4–8.3)

## 2015-08-22 MED ORDER — DIPHENHYDRAMINE HCL 25 MG PO CAPS
25.0000 mg | ORAL_CAPSULE | Freq: Once | ORAL | Status: AC
  Administered 2015-08-22: 25 mg via ORAL

## 2015-08-22 MED ORDER — HEPARIN SOD (PORK) LOCK FLUSH 100 UNIT/ML IV SOLN
500.0000 [IU] | Freq: Once | INTRAVENOUS | Status: AC | PRN
  Administered 2015-08-22: 500 [IU]
  Filled 2015-08-22: qty 5

## 2015-08-22 MED ORDER — ACETAMINOPHEN 325 MG PO TABS
ORAL_TABLET | ORAL | Status: AC
  Filled 2015-08-22: qty 2

## 2015-08-22 MED ORDER — LORAZEPAM 2 MG/ML IJ SOLN
0.5000 mg | Freq: Once | INTRAMUSCULAR | Status: AC
  Administered 2015-08-22: 0.5 mg via INTRAVENOUS

## 2015-08-22 MED ORDER — SODIUM CHLORIDE 0.9 % IJ SOLN
10.0000 mL | INTRAMUSCULAR | Status: DC | PRN
  Administered 2015-08-22: 10 mL
  Filled 2015-08-22: qty 10

## 2015-08-22 MED ORDER — ACETAMINOPHEN 325 MG PO TABS
650.0000 mg | ORAL_TABLET | Freq: Once | ORAL | Status: AC
  Administered 2015-08-22: 650 mg via ORAL

## 2015-08-22 MED ORDER — SODIUM CHLORIDE 0.9 % IV SOLN
420.0000 mg | Freq: Once | INTRAVENOUS | Status: AC
  Administered 2015-08-22: 420 mg via INTRAVENOUS
  Filled 2015-08-22: qty 14

## 2015-08-22 MED ORDER — TRASTUZUMAB CHEMO INJECTION 440 MG
6.0000 mg/kg | Freq: Once | INTRAVENOUS | Status: AC
  Administered 2015-08-22: 903 mg via INTRAVENOUS
  Filled 2015-08-22: qty 43

## 2015-08-22 MED ORDER — LORAZEPAM 2 MG/ML IJ SOLN
INTRAMUSCULAR | Status: AC
  Filled 2015-08-22: qty 1

## 2015-08-22 MED ORDER — OXYCODONE HCL ER 10 MG PO T12A: 10.0000 mg | EXTENDED_RELEASE_TABLET | Freq: Two times a day (BID) | ORAL | Status: DC

## 2015-08-22 MED ORDER — SODIUM CHLORIDE 0.9 % IV SOLN
Freq: Once | INTRAVENOUS | Status: AC
  Administered 2015-08-22: 10:00:00 via INTRAVENOUS

## 2015-08-22 MED ORDER — DIPHENHYDRAMINE HCL 25 MG PO CAPS
ORAL_CAPSULE | ORAL | Status: AC
  Filled 2015-08-22: qty 1

## 2015-08-22 NOTE — Patient Instructions (Signed)
[facility] Center Discharge Instructions for Patients Receiving Chemotherapy  Today you received the following chemotherapy agents Herceptin and Perjeta   To help prevent nausea and vomiting after your treatment, we encourage you to take your nausea medication as directed.    If you develop nausea and vomiting that is not controlled by your nausea medication, call the clinic.   BELOW ARE SYMPTOMS THAT SHOULD BE REPORTED IMMEDIATELY:  *FEVER GREATER THAN 100.5 F  *CHILLS WITH OR WITHOUT FEVER  NAUSEA AND VOMITING THAT IS NOT CONTROLLED WITH YOUR NAUSEA MEDICATION  *UNUSUAL SHORTNESS OF BREATH  *UNUSUAL BRUISING OR BLEEDING  TENDERNESS IN MOUTH AND THROAT WITH OR WITHOUT PRESENCE OF ULCERS  *URINARY PROBLEMS  *BOWEL PROBLEMS  UNUSUAL RASH Items with * indicate a potential emergency and should be followed up as soon as possible.  Feel free to call the clinic you have any questions or concerns. The clinic phone number is [phone].  Please show the CHEMO ALERT CARD at check-in to the Emergency Department and triage nurse.   

## 2015-08-24 MED FILL — OxyCONTIN 10 MG T12A: 10 | 30 days supply | Qty: 60 | Fill #0

## 2015-08-28 ENCOUNTER — Other Ambulatory Visit: Payer: Self-pay | Admitting: Family Medicine

## 2015-09-11 ENCOUNTER — Ambulatory Visit (HOSPITAL_BASED_OUTPATIENT_CLINIC_OR_DEPARTMENT_OTHER): Payer: Medicare Other

## 2015-09-11 ENCOUNTER — Other Ambulatory Visit (HOSPITAL_BASED_OUTPATIENT_CLINIC_OR_DEPARTMENT_OTHER): Payer: Medicare Other

## 2015-09-11 VITALS — BP 120/61 | HR 99 | Temp 97.4°F | Resp 20

## 2015-09-11 DIAGNOSIS — C50412 Malignant neoplasm of upper-outer quadrant of left female breast: Secondary | ICD-10-CM

## 2015-09-11 DIAGNOSIS — C50919 Malignant neoplasm of unspecified site of unspecified female breast: Secondary | ICD-10-CM

## 2015-09-11 DIAGNOSIS — Z5112 Encounter for antineoplastic immunotherapy: Secondary | ICD-10-CM | POA: Diagnosis not present

## 2015-09-11 DIAGNOSIS — C782 Secondary malignant neoplasm of pleura: Secondary | ICD-10-CM | POA: Diagnosis not present

## 2015-09-11 DIAGNOSIS — C50212 Malignant neoplasm of upper-inner quadrant of left female breast: Secondary | ICD-10-CM

## 2015-09-11 DIAGNOSIS — C7800 Secondary malignant neoplasm of unspecified lung: Principal | ICD-10-CM

## 2015-09-11 LAB — CBC WITH DIFFERENTIAL/PLATELET
BASO%: 0.7 % (ref 0.0–2.0)
Basophils Absolute: 0 10*3/uL (ref 0.0–0.1)
EOS%: 1.7 % (ref 0.0–7.0)
Eosinophils Absolute: 0.1 10*3/uL (ref 0.0–0.5)
HCT: 45.1 % (ref 34.8–46.6)
HGB: 14.9 g/dL (ref 11.6–15.9)
LYMPH%: 33.9 % (ref 14.0–49.7)
MCH: 27.6 pg (ref 25.1–34.0)
MCHC: 32.9 g/dL (ref 31.5–36.0)
MCV: 83.9 fL (ref 79.5–101.0)
MONO#: 0.4 10*3/uL (ref 0.1–0.9)
MONO%: 5.2 % (ref 0.0–14.0)
NEUT#: 4.3 10*3/uL (ref 1.5–6.5)
NEUT%: 58.5 % (ref 38.4–76.8)
Platelets: 255 10*3/uL (ref 145–400)
RBC: 5.38 10*6/uL (ref 3.70–5.45)
RDW: 14.3 % (ref 11.2–14.5)
WBC: 7.4 10*3/uL (ref 3.9–10.3)
lymph#: 2.5 10*3/uL (ref 0.9–3.3)

## 2015-09-11 LAB — COMPREHENSIVE METABOLIC PANEL
ALT: 58 U/L — ABNORMAL HIGH (ref 0–55)
AST: 30 U/L (ref 5–34)
Albumin: 3.2 g/dL — ABNORMAL LOW (ref 3.5–5.0)
Alkaline Phosphatase: 147 U/L (ref 40–150)
Anion Gap: 10 mEq/L (ref 3–11)
BUN: 15.4 mg/dL (ref 7.0–26.0)
CO2: 28 mEq/L (ref 22–29)
Calcium: 9.8 mg/dL (ref 8.4–10.4)
Chloride: 100 mEq/L (ref 98–109)
Creatinine: 0.9 mg/dL (ref 0.6–1.1)
EGFR: 71 mL/min/{1.73_m2} — ABNORMAL LOW (ref >90)
Glucose: 277 mg/dl — ABNORMAL HIGH (ref 70–140)
Potassium: 4 mEq/L (ref 3.5–5.1)
Sodium: 138 mEq/L (ref 136–145)
Total Bilirubin: 0.42 mg/dL (ref 0.20–1.20)
Total Protein: 7.4 g/dL (ref 6.4–8.3)

## 2015-09-11 MED ORDER — LORAZEPAM 2 MG/ML IJ SOLN
0.5000 mg | Freq: Once | INTRAMUSCULAR | Status: AC
  Administered 2015-09-11: 0.5 mg via INTRAVENOUS

## 2015-09-11 MED ORDER — LORAZEPAM 2 MG/ML IJ SOLN
INTRAMUSCULAR | Status: AC
  Filled 2015-09-11: qty 1

## 2015-09-11 MED ORDER — ACETAMINOPHEN 325 MG PO TABS
ORAL_TABLET | ORAL | Status: AC
  Filled 2015-09-11: qty 2

## 2015-09-11 MED ORDER — SODIUM CHLORIDE 0.9 % IJ SOLN
10.0000 mL | INTRAMUSCULAR | Status: DC | PRN
  Administered 2015-09-11: 10 mL
  Filled 2015-09-11: qty 10

## 2015-09-11 MED ORDER — SODIUM CHLORIDE 0.9 % IV SOLN
Freq: Once | INTRAVENOUS | Status: AC
  Administered 2015-09-11: 10:00:00 via INTRAVENOUS

## 2015-09-11 MED ORDER — DIPHENHYDRAMINE HCL 25 MG PO CAPS
25.0000 mg | ORAL_CAPSULE | Freq: Once | ORAL | Status: AC
  Administered 2015-09-11: 25 mg via ORAL

## 2015-09-11 MED ORDER — TRASTUZUMAB CHEMO INJECTION 440 MG
6.0000 mg/kg | Freq: Once | INTRAVENOUS | Status: AC
  Administered 2015-09-11: 903 mg via INTRAVENOUS
  Filled 2015-09-11: qty 43

## 2015-09-11 MED ORDER — ACETAMINOPHEN 325 MG PO TABS
650.0000 mg | ORAL_TABLET | Freq: Once | ORAL | Status: AC
  Administered 2015-09-11: 650 mg via ORAL

## 2015-09-11 MED ORDER — SODIUM CHLORIDE 0.9 % IV SOLN
420.0000 mg | Freq: Once | INTRAVENOUS | Status: AC
  Administered 2015-09-11: 420 mg via INTRAVENOUS
  Filled 2015-09-11: qty 14

## 2015-09-11 MED ORDER — HEPARIN SOD (PORK) LOCK FLUSH 100 UNIT/ML IV SOLN
500.0000 [IU] | Freq: Once | INTRAVENOUS | Status: AC | PRN
  Administered 2015-09-11: 500 [IU]
  Filled 2015-09-11: qty 5

## 2015-09-11 MED ORDER — DIPHENHYDRAMINE HCL 25 MG PO CAPS
ORAL_CAPSULE | ORAL | Status: AC
  Filled 2015-09-11: qty 1

## 2015-09-11 NOTE — Patient Instructions (Signed)
[facility] Discharge Instructions for Patients Receiving Chemotherapy  Today you received the following chemotherapy agents Herceptin and Perjeta   To help prevent nausea and vomiting after your treatment, we encourage you to take your nausea medication as directed.    If you develop nausea and vomiting that is not controlled by your nausea medication, call the clinic.   BELOW ARE SYMPTOMS THAT SHOULD BE REPORTED IMMEDIATELY:  *FEVER GREATER THAN 100.5 F  *CHILLS WITH OR WITHOUT FEVER  NAUSEA AND VOMITING THAT IS NOT CONTROLLED WITH YOUR NAUSEA MEDICATION  *UNUSUAL SHORTNESS OF BREATH  *UNUSUAL BRUISING OR BLEEDING  TENDERNESS IN MOUTH AND THROAT WITH OR WITHOUT PRESENCE OF ULCERS  *URINARY PROBLEMS  *BOWEL PROBLEMS  UNUSUAL RASH Items with * indicate a potential emergency and should be followed up as soon as possible.  Feel free to call the clinic you have any questions or concerns. The clinic phone number is [phone].  Please show the CHEMO ALERT CARD at check-in to the Emergency Department and triage nurse.   

## 2015-10-01 ENCOUNTER — Other Ambulatory Visit: Payer: Self-pay

## 2015-10-01 DIAGNOSIS — C50212 Malignant neoplasm of upper-inner quadrant of left female breast: Secondary | ICD-10-CM

## 2015-10-01 DIAGNOSIS — M48061 Spinal stenosis, lumbar region without neurogenic claudication: Secondary | ICD-10-CM

## 2015-10-01 DIAGNOSIS — M47813 Spondylosis without myelopathy or radiculopathy, cervicothoracic region: Secondary | ICD-10-CM

## 2015-10-01 DIAGNOSIS — C50912 Malignant neoplasm of unspecified site of left female breast: Secondary | ICD-10-CM

## 2015-10-01 DIAGNOSIS — C7802 Secondary malignant neoplasm of left lung: Principal | ICD-10-CM

## 2015-10-01 MED ORDER — OXYCODONE HCL ER 10 MG PO T12A: 10.0000 mg | EXTENDED_RELEASE_TABLET | Freq: Two times a day (BID) | ORAL | Status: DC

## 2015-10-02 ENCOUNTER — Ambulatory Visit (HOSPITAL_BASED_OUTPATIENT_CLINIC_OR_DEPARTMENT_OTHER): Payer: Medicare Other

## 2015-10-02 ENCOUNTER — Other Ambulatory Visit (HOSPITAL_BASED_OUTPATIENT_CLINIC_OR_DEPARTMENT_OTHER): Payer: Medicare Other

## 2015-10-02 ENCOUNTER — Encounter: Payer: Self-pay | Admitting: Oncology

## 2015-10-02 ENCOUNTER — Ambulatory Visit (HOSPITAL_BASED_OUTPATIENT_CLINIC_OR_DEPARTMENT_OTHER): Payer: Medicare Other | Admitting: Oncology

## 2015-10-02 VITALS — BP 146/80 | HR 62 | Temp 97.8°F | Resp 18 | Ht 64.0 in | Wt 310.4 lb

## 2015-10-02 DIAGNOSIS — C7800 Secondary malignant neoplasm of unspecified lung: Secondary | ICD-10-CM

## 2015-10-02 DIAGNOSIS — E0842 Diabetes mellitus due to underlying condition with diabetic polyneuropathy: Secondary | ICD-10-CM | POA: Diagnosis not present

## 2015-10-02 DIAGNOSIS — C7802 Secondary malignant neoplasm of left lung: Principal | ICD-10-CM

## 2015-10-02 DIAGNOSIS — C50912 Malignant neoplasm of unspecified site of left female breast: Secondary | ICD-10-CM | POA: Diagnosis not present

## 2015-10-02 DIAGNOSIS — C50212 Malignant neoplasm of upper-inner quadrant of left female breast: Secondary | ICD-10-CM

## 2015-10-02 DIAGNOSIS — Z171 Estrogen receptor negative status [ER-]: Secondary | ICD-10-CM | POA: Diagnosis not present

## 2015-10-02 DIAGNOSIS — Z5112 Encounter for antineoplastic immunotherapy: Secondary | ICD-10-CM | POA: Diagnosis not present

## 2015-10-02 DIAGNOSIS — C50919 Malignant neoplasm of unspecified site of unspecified female breast: Secondary | ICD-10-CM

## 2015-10-02 DIAGNOSIS — E1149 Type 2 diabetes mellitus with other diabetic neurological complication: Secondary | ICD-10-CM

## 2015-10-02 DIAGNOSIS — Z79811 Long term (current) use of aromatase inhibitors: Secondary | ICD-10-CM

## 2015-10-02 LAB — CBC WITH DIFFERENTIAL/PLATELET
BASO%: 0.7 % (ref 0.0–2.0)
Basophils Absolute: 0.1 10*3/uL (ref 0.0–0.1)
EOS%: 1.2 % (ref 0.0–7.0)
Eosinophils Absolute: 0.1 10*3/uL (ref 0.0–0.5)
HCT: 47.4 % — ABNORMAL HIGH (ref 34.8–46.6)
HGB: 15.5 g/dL (ref 11.6–15.9)
LYMPH%: 29.3 % (ref 14.0–49.7)
MCH: 27.6 pg (ref 25.1–34.0)
MCHC: 32.8 g/dL (ref 31.5–36.0)
MCV: 84.2 fL (ref 79.5–101.0)
MONO#: 0.4 10*3/uL (ref 0.1–0.9)
MONO%: 5.8 % (ref 0.0–14.0)
NEUT#: 4.9 10*3/uL (ref 1.5–6.5)
NEUT%: 63 % (ref 38.4–76.8)
Platelets: 262 10*3/uL (ref 145–400)
RBC: 5.63 10*6/uL — ABNORMAL HIGH (ref 3.70–5.45)
RDW: 14.4 % (ref 11.2–14.5)
WBC: 7.7 10*3/uL (ref 3.9–10.3)
lymph#: 2.3 10*3/uL (ref 0.9–3.3)

## 2015-10-02 LAB — COMPREHENSIVE METABOLIC PANEL
ALT: 64 U/L — ABNORMAL HIGH (ref 0–55)
AST: 37 U/L — ABNORMAL HIGH (ref 5–34)
Albumin: 3.2 g/dL — ABNORMAL LOW (ref 3.5–5.0)
Alkaline Phosphatase: 161 U/L — ABNORMAL HIGH (ref 40–150)
Anion Gap: 11 mEq/L (ref 3–11)
BUN: 13.4 mg/dL (ref 7.0–26.0)
CO2: 28 mEq/L (ref 22–29)
Calcium: 9.9 mg/dL (ref 8.4–10.4)
Chloride: 99 mEq/L (ref 98–109)
Creatinine: 0.9 mg/dL (ref 0.6–1.1)
EGFR: 68 mL/min/{1.73_m2} — ABNORMAL LOW (ref >90)
Glucose: 247 mg/dl — ABNORMAL HIGH (ref 70–140)
Potassium: 4.3 mEq/L (ref 3.5–5.1)
Sodium: 137 mEq/L (ref 136–145)
Total Bilirubin: 0.51 mg/dL (ref 0.20–1.20)
Total Protein: 7.8 g/dL (ref 6.4–8.3)

## 2015-10-02 MED ORDER — LORAZEPAM 2 MG/ML IJ SOLN
0.5000 mg | Freq: Once | INTRAMUSCULAR | Status: AC
  Administered 2015-10-02: 0.5 mg via INTRAVENOUS

## 2015-10-02 MED ORDER — SODIUM CHLORIDE 0.9 % IJ SOLN
10.0000 mL | INTRAMUSCULAR | Status: DC | PRN
  Administered 2015-10-02: 10 mL
  Filled 2015-10-02: qty 10

## 2015-10-02 MED ORDER — ACETAMINOPHEN 325 MG PO TABS
650.0000 mg | ORAL_TABLET | Freq: Once | ORAL | Status: AC
  Administered 2015-10-02: 650 mg via ORAL

## 2015-10-02 MED ORDER — LORAZEPAM 2 MG/ML IJ SOLN
INTRAMUSCULAR | Status: AC
  Filled 2015-10-02: qty 1

## 2015-10-02 MED ORDER — PERTUZUMAB CHEMO INJECTION 420 MG/14ML
420.0000 mg | Freq: Once | INTRAVENOUS | Status: AC
  Administered 2015-10-02: 420 mg via INTRAVENOUS
  Filled 2015-10-02: qty 14

## 2015-10-02 MED ORDER — ACETAMINOPHEN 325 MG PO TABS
ORAL_TABLET | ORAL | Status: AC
  Filled 2015-10-02: qty 2

## 2015-10-02 MED ORDER — SODIUM CHLORIDE 0.9 % IV SOLN
Freq: Once | INTRAVENOUS | Status: AC
  Administered 2015-10-02: 11:00:00 via INTRAVENOUS

## 2015-10-02 MED ORDER — DIPHENHYDRAMINE HCL 25 MG PO CAPS
ORAL_CAPSULE | ORAL | Status: AC
Start: 2015-10-02 — End: 2015-10-02
  Filled 2015-10-02: qty 1

## 2015-10-02 MED ORDER — DIPHENHYDRAMINE HCL 25 MG PO CAPS
25.0000 mg | ORAL_CAPSULE | Freq: Once | ORAL | Status: AC
  Administered 2015-10-02: 25 mg via ORAL

## 2015-10-02 MED ORDER — HEPARIN SOD (PORK) LOCK FLUSH 100 UNIT/ML IV SOLN
500.0000 [IU] | Freq: Once | INTRAVENOUS | Status: AC | PRN
  Administered 2015-10-02: 500 [IU]
  Filled 2015-10-02: qty 5

## 2015-10-02 MED ORDER — TRASTUZUMAB CHEMO INJECTION 440 MG
6.0000 mg/kg | Freq: Once | INTRAVENOUS | Status: AC
  Administered 2015-10-02: 903 mg via INTRAVENOUS
  Filled 2015-10-02: qty 43

## 2015-10-02 MED FILL — OxyCONTIN 10 MG T12A: 10 | 30 days supply | Qty: 60 | Fill #0

## 2015-10-02 NOTE — Patient Instructions (Signed)
[facility] Discharge Instructions for Patients Receiving Chemotherapy  Today you received the following chemotherapy agents Herceptin and Perjeta   To help prevent nausea and vomiting after your treatment, we encourage you to take your nausea medication as directed.    If you develop nausea and vomiting that is not controlled by your nausea medication, call the clinic.   BELOW ARE SYMPTOMS THAT SHOULD BE REPORTED IMMEDIATELY:  *FEVER GREATER THAN 100.5 F  *CHILLS WITH OR WITHOUT FEVER  NAUSEA AND VOMITING THAT IS NOT CONTROLLED WITH YOUR NAUSEA MEDICATION  *UNUSUAL SHORTNESS OF BREATH  *UNUSUAL BRUISING OR BLEEDING  TENDERNESS IN MOUTH AND THROAT WITH OR WITHOUT PRESENCE OF ULCERS  *URINARY PROBLEMS  *BOWEL PROBLEMS  UNUSUAL RASH Items with * indicate a potential emergency and should be followed up as soon as possible.  Feel free to call the clinic you have any questions or concerns. The clinic phone number is [phone].  Please show the CHEMO ALERT CARD at check-in to the Emergency Department and triage nurse.   
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
## 2015-10-02 NOTE — Progress Notes (Signed)
10/02/15 - 23 study - ePRO's  - Patient into cancer center for routine visit.  Met with patient in the treatment area.  Patient completed the ePRO's without assistance.  The patient was thanked for her continued support in this clinical trial. Remer Macho 10/02/15 - 11:00 am

## 2015-10-03 ENCOUNTER — Encounter: Payer: Self-pay | Admitting: Oncology

## 2015-10-03 NOTE — Progress Notes (Signed)
I faxed prior auth req for oxycontin

## 2015-10-03 NOTE — Progress Notes (Signed)
Per [employer] oxycontin approved 08/17/15-08/17/16 [id number]. I sent to medical records

## 2015-10-06 ENCOUNTER — Telehealth: Payer: Self-pay | Admitting: Oncology

## 2015-10-06 NOTE — Telephone Encounter (Signed)
Left message for patient re add on appointments for March thru May. Other appointments remain the same and patient to get new schedule at next visit 3/7.

## 2015-10-08 ENCOUNTER — Telehealth (HOSPITAL_COMMUNITY): Payer: Self-pay | Admitting: Vascular Surgery

## 2015-10-08 NOTE — Telephone Encounter (Signed)
Left message top make appt

## 2015-10-09 ENCOUNTER — Telehealth (HOSPITAL_COMMUNITY): Payer: Self-pay | Admitting: Vascular Surgery

## 2015-10-09 NOTE — Telephone Encounter (Signed)
Left pt message to make new pt appt w/ ECHO

## 2015-10-12 ENCOUNTER — Telehealth: Payer: Self-pay | Admitting: *Deleted

## 2015-10-12 ENCOUNTER — Telehealth: Payer: Self-pay

## 2015-10-12 ENCOUNTER — Other Ambulatory Visit: Payer: Self-pay | Admitting: *Deleted

## 2015-10-12 MED ORDER — NITROFURANTOIN MONOHYD MACRO 100 MG PO CAPS: 100.0000 mg | ORAL_CAPSULE | Freq: Two times a day (BID) | ORAL | Status: DC

## 2015-10-12 NOTE — Telephone Encounter (Signed)
Patient states she has a UTI and is requesting an antibiotic.  She would like the nurse to call her back.

## 2015-10-12 NOTE — Telephone Encounter (Signed)
This RN was requested to assess and attempt port access post flush nurse attempt was unsuccessful.  Port site clean dry and intact. No noted swelling or bruising.  Port palpated with noted broad and flat surface. Unable to locate port markers. Radiology report obtained and noted per views of film of port site with visible markers on film. This RN per protocol attempted access with noted movement of port and with no access to rubber hub- needle removed and site dressed with gauze and tape.  Pt tolerated procedure with some discomfort.  MD notified and NP- this RN contacted IR and discussed above.  Was informed to send patient over for work in assessment.  Above discussed with pt and EMLA applied to site for pt comfort.

## 2015-10-12 NOTE — Telephone Encounter (Signed)
Per MD review- obtained prescription for nitrofurantoin.  Pt aware of above.

## 2015-10-23 ENCOUNTER — Other Ambulatory Visit (HOSPITAL_BASED_OUTPATIENT_CLINIC_OR_DEPARTMENT_OTHER): Payer: Medicare Other

## 2015-10-23 ENCOUNTER — Ambulatory Visit (HOSPITAL_BASED_OUTPATIENT_CLINIC_OR_DEPARTMENT_OTHER): Payer: Medicare Other

## 2015-10-23 VITALS — BP 148/64 | HR 71 | Temp 97.8°F

## 2015-10-23 DIAGNOSIS — C7800 Secondary malignant neoplasm of unspecified lung: Secondary | ICD-10-CM

## 2015-10-23 DIAGNOSIS — C50912 Malignant neoplasm of unspecified site of left female breast: Secondary | ICD-10-CM

## 2015-10-23 DIAGNOSIS — Z5112 Encounter for antineoplastic immunotherapy: Secondary | ICD-10-CM | POA: Diagnosis not present

## 2015-10-23 DIAGNOSIS — C50919 Malignant neoplasm of unspecified site of unspecified female breast: Secondary | ICD-10-CM

## 2015-10-23 DIAGNOSIS — C50212 Malignant neoplasm of upper-inner quadrant of left female breast: Secondary | ICD-10-CM

## 2015-10-23 LAB — CBC WITH DIFFERENTIAL/PLATELET
BASO%: 0.3 % (ref 0.0–2.0)
Basophils Absolute: 0 10*3/uL (ref 0.0–0.1)
EOS%: 1.6 % (ref 0.0–7.0)
Eosinophils Absolute: 0.1 10*3/uL (ref 0.0–0.5)
HCT: 42 % (ref 34.8–46.6)
HGB: 13.8 g/dL (ref 11.6–15.9)
LYMPH%: 26.4 % (ref 14.0–49.7)
MCH: 28 pg (ref 25.1–34.0)
MCHC: 32.9 g/dL (ref 31.5–36.0)
MCV: 85.2 fL (ref 79.5–101.0)
MONO#: 0.3 10*3/uL (ref 0.1–0.9)
MONO%: 3.9 % (ref 0.0–14.0)
NEUT#: 5 10*3/uL (ref 1.5–6.5)
NEUT%: 67.8 % (ref 38.4–76.8)
Platelets: 262 10*3/uL (ref 145–400)
RBC: 4.93 10*6/uL (ref 3.70–5.45)
RDW: 14.2 % (ref 11.2–14.5)
WBC: 7.4 10*3/uL (ref 3.9–10.3)
lymph#: 1.9 10*3/uL (ref 0.9–3.3)

## 2015-10-23 LAB — COMPREHENSIVE METABOLIC PANEL
ALT: 50 U/L (ref 0–55)
AST: 39 U/L — ABNORMAL HIGH (ref 5–34)
Albumin: 2.8 g/dL — ABNORMAL LOW (ref 3.5–5.0)
Alkaline Phosphatase: 131 U/L (ref 40–150)
Anion Gap: 10 mEq/L (ref 3–11)
BUN: 14.1 mg/dL (ref 7.0–26.0)
CO2: 28 mEq/L (ref 22–29)
Calcium: 9.3 mg/dL (ref 8.4–10.4)
Chloride: 100 mEq/L (ref 98–109)
Creatinine: 0.9 mg/dL (ref 0.6–1.1)
EGFR: 72 mL/min/{1.73_m2} — ABNORMAL LOW (ref >90)
Glucose: 355 mg/dl — ABNORMAL HIGH (ref 70–140)
Potassium: 3.9 mEq/L (ref 3.5–5.1)
Sodium: 138 mEq/L (ref 136–145)
Total Bilirubin: <0.3 mg/dL (ref 0.20–1.20)
Total Protein: 6.9 g/dL (ref 6.4–8.3)

## 2015-10-23 MED ORDER — DIPHENHYDRAMINE HCL 25 MG PO CAPS
ORAL_CAPSULE | ORAL | Status: AC
  Filled 2015-10-23: qty 1

## 2015-10-23 MED ORDER — TRASTUZUMAB CHEMO INJECTION 440 MG
6.0000 mg/kg | Freq: Once | INTRAVENOUS | Status: AC
  Administered 2015-10-23: 903 mg via INTRAVENOUS
  Filled 2015-10-23: qty 43

## 2015-10-23 MED ORDER — LORAZEPAM 2 MG/ML IJ SOLN
INTRAMUSCULAR | Status: AC
  Filled 2015-10-23: qty 1

## 2015-10-23 MED ORDER — SODIUM CHLORIDE 0.9 % IJ SOLN
10.0000 mL | INTRAMUSCULAR | Status: DC | PRN
  Administered 2015-10-23: 10 mL
  Filled 2015-10-23: qty 10

## 2015-10-23 MED ORDER — SODIUM CHLORIDE 0.9 % IV SOLN
Freq: Once | INTRAVENOUS | Status: AC
  Administered 2015-10-23: 10:00:00 via INTRAVENOUS

## 2015-10-23 MED ORDER — DIPHENHYDRAMINE HCL 25 MG PO CAPS
25.0000 mg | ORAL_CAPSULE | Freq: Once | ORAL | Status: AC
  Administered 2015-10-23: 25 mg via ORAL

## 2015-10-23 MED ORDER — ACETAMINOPHEN 325 MG PO TABS
650.0000 mg | ORAL_TABLET | Freq: Once | ORAL | Status: AC
  Administered 2015-10-23: 650 mg via ORAL

## 2015-10-23 MED ORDER — HEPARIN SOD (PORK) LOCK FLUSH 100 UNIT/ML IV SOLN
500.0000 [IU] | Freq: Once | INTRAVENOUS | Status: AC | PRN
  Administered 2015-10-23: 500 [IU]
  Filled 2015-10-23: qty 5

## 2015-10-23 MED ORDER — ACETAMINOPHEN 325 MG PO TABS
ORAL_TABLET | ORAL | Status: AC
  Filled 2015-10-23: qty 2

## 2015-10-23 MED ORDER — LORAZEPAM 2 MG/ML IJ SOLN
0.5000 mg | Freq: Once | INTRAMUSCULAR | Status: AC
  Administered 2015-10-23: 0.5 mg via INTRAVENOUS

## 2015-10-23 MED ORDER — SODIUM CHLORIDE 0.9 % IV SOLN
420.0000 mg | Freq: Once | INTRAVENOUS | Status: AC
  Administered 2015-10-23: 420 mg via INTRAVENOUS
  Filled 2015-10-23: qty 14

## 2015-10-23 NOTE — Progress Notes (Signed)
Pt refused to wait 30 min post Perjeta infusion.  States has had for many months and never any reaction.

## 2015-10-23 NOTE — Patient Instructions (Signed)
[facility] Discharge Instructions for Patients Receiving Chemotherapy  Today you received the following treatment agents; Herceptin and Perjeta.     BELOW ARE SYMPTOMS THAT SHOULD BE REPORTED IMMEDIATELY:  *FEVER GREATER THAN 100.5 F  *CHILLS WITH OR WITHOUT FEVER  NAUSEA AND VOMITING THAT IS NOT CONTROLLED WITH YOUR NAUSEA MEDICATION  *UNUSUAL SHORTNESS OF BREATH  *UNUSUAL BRUISING OR BLEEDING  TENDERNESS IN MOUTH AND THROAT WITH OR WITHOUT PRESENCE OF ULCERS  *URINARY PROBLEMS  *BOWEL PROBLEMS  UNUSUAL RASH Items with * indicate a potential emergency and should be followed up as soon as possible.  Feel free to call the clinic you have any questions or concerns. The clinic phone number is [phone].  Please show the [facility] at check-in to the Emergency Department and triage nurse.

## 2015-10-24 LAB — CANCER ANTIGEN 27-29 (PARALLEL TESTING): CA 27.29: 18 U/mL (ref <38)

## 2015-10-24 LAB — CANCER ANTIGEN 27.29: CA 27.29: 16.5 U/mL (ref 0.0–38.6)

## 2015-10-29 ENCOUNTER — Ambulatory Visit (HOSPITAL_COMMUNITY)
Admission: RE | Admit: 2015-10-29 | Discharge: 2015-10-29 | Disposition: A | Discharge status: Home / Self Care | Payer: Medicare Other | Source: Ambulatory Visit | Attending: Oncology | Admitting: Oncology

## 2015-10-29 ENCOUNTER — Ambulatory Visit (HOSPITAL_COMMUNITY)
Admission: RE | Admit: 2015-10-29 | Discharge: 2015-10-29 | Disposition: A | Discharge status: Home / Self Care | Payer: Medicare Other | Source: Ambulatory Visit | Attending: Family Medicine | Admitting: Family Medicine

## 2015-10-29 DIAGNOSIS — E119 Type 2 diabetes mellitus without complications: Secondary | ICD-10-CM | POA: Insufficient documentation

## 2015-10-29 DIAGNOSIS — C50012 Malignant neoplasm of nipple and areola, left female breast: Secondary | ICD-10-CM | POA: Diagnosis not present

## 2015-10-29 DIAGNOSIS — I119 Hypertensive heart disease without heart failure: Secondary | ICD-10-CM | POA: Diagnosis not present

## 2015-10-29 LAB — ECHOCARDIOGRAM COMPLETE
AORTIC ROOT 2D: 28 mm
E decel time: 271 msec
E/e' ratio: 6.97
FS: 33 % (ref 28–44)
IVS/LV PW RATIO, ED: 0.98
LA ID, A-P, ES: 34 mm
LA SIZE INDEX: 1.44 mm/m2
LA VOL 2D INDEX: 24 mL/m2
LA VOL 2D: 56.7 mL
LA diam end sys: 34 mm
LA vol index: 22.4 mL/m2
LA vol: 52.8 mL
LV PW d: 12.4 mm — AB (ref 0.6–1.1)
LV PW s: 12.4 mm
LVIDD: 40.4 mm — AB (ref 3.5–6.0)
LVIDS: 26.9 mm — AB (ref 2.1–4.0)
LVOT area: 3.14 cm2
LVOT diameter: 20 mm
MV Dec: 271 ms
MV pk A vel: 82 cm/s
MV pk E vel: 60.6 cm/s
TDI e' lateral: 8.7 cm/s
TDI e' medial: 5.33 cm/s

## 2015-10-29 MED ORDER — PERFLUTREN LIPID MICROSPHERE
INTRAVENOUS | Status: AC
  Filled 2015-10-29: qty 10

## 2015-10-29 NOTE — Progress Notes (Signed)
  Echocardiogram 2D Echocardiogram has been performed.  Diamond Nickel 10/29/2015, 11:54 AM

## 2015-10-30 ENCOUNTER — Telehealth (HOSPITAL_COMMUNITY): Payer: Self-pay

## 2015-10-30 NOTE — Telephone Encounter (Signed)
TC from patient requesting refill on her Oxycontin 10 mg tabs,  Last filled on 10/01/15 for 60 tabs.  Please call patient @ [phone] when prescription is ready for pick up.

## 2015-10-30 NOTE — Telephone Encounter (Signed)
Echo results from 10/29/15 reviewed with patient. No questions or concerns at this time.  Renee Pain

## 2015-10-31 ENCOUNTER — Other Ambulatory Visit: Payer: Self-pay | Admitting: *Deleted

## 2015-10-31 DIAGNOSIS — M47813 Spondylosis without myelopathy or radiculopathy, cervicothoracic region: Secondary | ICD-10-CM

## 2015-10-31 DIAGNOSIS — C7802 Secondary malignant neoplasm of left lung: Principal | ICD-10-CM

## 2015-10-31 DIAGNOSIS — C50912 Malignant neoplasm of unspecified site of left female breast: Secondary | ICD-10-CM

## 2015-10-31 DIAGNOSIS — M48061 Spinal stenosis, lumbar region without neurogenic claudication: Secondary | ICD-10-CM

## 2015-10-31 DIAGNOSIS — C50212 Malignant neoplasm of upper-inner quadrant of left female breast: Secondary | ICD-10-CM

## 2015-10-31 MED ORDER — OXYCODONE HCL ER 10 MG PO T12A: 10.0000 mg | EXTENDED_RELEASE_TABLET | Freq: Two times a day (BID) | ORAL | Status: DC

## 2015-11-02 MED FILL — OxyCONTIN 10 MG T12A: 10 | 30 days supply | Qty: 60 | Fill #0

## 2015-11-03 ENCOUNTER — Emergency Department (HOSPITAL_COMMUNITY): Payer: Medicare Other

## 2015-11-03 ENCOUNTER — Inpatient Hospital Stay (HOSPITAL_COMMUNITY)
Admission: EM | Admit: 2015-11-03 | Discharge: 2015-11-09 | DRG: 871 | Disposition: A | Discharge status: Home / Self Care | Payer: Medicare Other | Attending: Internal Medicine | Admitting: Internal Medicine

## 2015-11-03 ENCOUNTER — Encounter (HOSPITAL_COMMUNITY): Payer: Self-pay | Admitting: Emergency Medicine

## 2015-11-03 DIAGNOSIS — E1149 Type 2 diabetes mellitus with other diabetic neurological complication: Secondary | ICD-10-CM | POA: Diagnosis present

## 2015-11-03 DIAGNOSIS — M7989 Other specified soft tissue disorders: Secondary | ICD-10-CM

## 2015-11-03 DIAGNOSIS — J9811 Atelectasis: Secondary | ICD-10-CM | POA: Diagnosis present

## 2015-11-03 DIAGNOSIS — J069 Acute upper respiratory infection, unspecified: Secondary | ICD-10-CM | POA: Diagnosis present

## 2015-11-03 DIAGNOSIS — E872 Acidosis: Secondary | ICD-10-CM | POA: Diagnosis not present

## 2015-11-03 DIAGNOSIS — R000 Tachycardia, unspecified: Secondary | ICD-10-CM | POA: Diagnosis present

## 2015-11-03 DIAGNOSIS — Z86718 Personal history of other venous thrombosis and embolism: Secondary | ICD-10-CM

## 2015-11-03 DIAGNOSIS — E662 Morbid (severe) obesity with alveolar hypoventilation: Secondary | ICD-10-CM | POA: Diagnosis present

## 2015-11-03 DIAGNOSIS — M549 Dorsalgia, unspecified: Secondary | ICD-10-CM | POA: Diagnosis present

## 2015-11-03 DIAGNOSIS — Z880 Allergy status to penicillin: Secondary | ICD-10-CM

## 2015-11-03 DIAGNOSIS — A419 Sepsis, unspecified organism: Secondary | ICD-10-CM | POA: Diagnosis not present

## 2015-11-03 DIAGNOSIS — J9602 Acute respiratory failure with hypercapnia: Secondary | ICD-10-CM | POA: Diagnosis present

## 2015-11-03 DIAGNOSIS — R0602 Shortness of breath: Secondary | ICD-10-CM

## 2015-11-03 DIAGNOSIS — Z794 Long term (current) use of insulin: Secondary | ICD-10-CM

## 2015-11-03 DIAGNOSIS — C50912 Malignant neoplasm of unspecified site of left female breast: Secondary | ICD-10-CM | POA: Diagnosis present

## 2015-11-03 DIAGNOSIS — E871 Hypo-osmolality and hyponatremia: Secondary | ICD-10-CM | POA: Diagnosis present

## 2015-11-03 DIAGNOSIS — I10 Essential (primary) hypertension: Secondary | ICD-10-CM | POA: Diagnosis present

## 2015-11-03 DIAGNOSIS — Z9981 Dependence on supplemental oxygen: Secondary | ICD-10-CM

## 2015-11-03 DIAGNOSIS — G4730 Sleep apnea, unspecified: Secondary | ICD-10-CM | POA: Diagnosis present

## 2015-11-03 DIAGNOSIS — Z79811 Long term (current) use of aromatase inhibitors: Secondary | ICD-10-CM

## 2015-11-03 DIAGNOSIS — N390 Urinary tract infection, site not specified: Secondary | ICD-10-CM | POA: Diagnosis present

## 2015-11-03 DIAGNOSIS — M25512 Pain in left shoulder: Secondary | ICD-10-CM

## 2015-11-03 DIAGNOSIS — C50919 Malignant neoplasm of unspecified site of unspecified female breast: Secondary | ICD-10-CM | POA: Diagnosis present

## 2015-11-03 DIAGNOSIS — Z833 Family history of diabetes mellitus: Secondary | ICD-10-CM

## 2015-11-03 DIAGNOSIS — B961 Klebsiella pneumoniae [K. pneumoniae] as the cause of diseases classified elsewhere: Secondary | ICD-10-CM | POA: Diagnosis present

## 2015-11-03 DIAGNOSIS — I5032 Chronic diastolic (congestive) heart failure: Secondary | ICD-10-CM | POA: Diagnosis present

## 2015-11-03 DIAGNOSIS — Z8249 Family history of ischemic heart disease and other diseases of the circulatory system: Secondary | ICD-10-CM

## 2015-11-03 DIAGNOSIS — Z87891 Personal history of nicotine dependence: Secondary | ICD-10-CM

## 2015-11-03 DIAGNOSIS — E669 Obesity, unspecified: Secondary | ICD-10-CM | POA: Diagnosis present

## 2015-11-03 DIAGNOSIS — M79602 Pain in left arm: Secondary | ICD-10-CM | POA: Diagnosis present

## 2015-11-03 DIAGNOSIS — Z6841 Body Mass Index (BMI) 40.0 and over, adult: Secondary | ICD-10-CM

## 2015-11-03 DIAGNOSIS — C7800 Secondary malignant neoplasm of unspecified lung: Secondary | ICD-10-CM | POA: Diagnosis present

## 2015-11-03 DIAGNOSIS — J189 Pneumonia, unspecified organism: Secondary | ICD-10-CM

## 2015-11-03 LAB — I-STAT TROPONIN, ED
Troponin i, poc: 0.01 ng/mL (ref 0.00–0.08)
Troponin i, poc: 0 ng/mL (ref 0.00–0.08)

## 2015-11-03 LAB — BASIC METABOLIC PANEL
Anion gap: 12 (ref 5–15)
BUN: 17 mg/dL (ref 6–20)
CO2: 27 mmol/L (ref 22–32)
Calcium: 9.3 mg/dL (ref 8.9–10.3)
Chloride: 95 mmol/L — ABNORMAL LOW (ref 101–111)
Creatinine, Ser: 0.93 mg/dL (ref 0.44–1.00)
GFR calc Af Amer: >60 mL/min (ref >60)
GFR calc non Af Amer: >60 mL/min (ref >60)
Glucose, Bld: 289 mg/dL — ABNORMAL HIGH (ref 65–99)
Potassium: 4.1 mmol/L (ref 3.5–5.1)
Sodium: 134 mmol/L — ABNORMAL LOW (ref 135–145)

## 2015-11-03 LAB — CBC
HCT: 44.2 % (ref 36.0–46.0)
Hemoglobin: 14.9 g/dL (ref 12.0–15.0)
MCH: 28.3 pg (ref 26.0–34.0)
MCHC: 33.7 g/dL (ref 30.0–36.0)
MCV: 84 fL (ref 78.0–100.0)
Platelets: 242 10*3/uL (ref 150–400)
RBC: 5.26 MIL/uL — ABNORMAL HIGH (ref 3.87–5.11)
RDW: 14.6 % (ref 11.5–15.5)
WBC: 4 10*3/uL (ref 4.0–10.5)

## 2015-11-03 LAB — HEPATIC FUNCTION PANEL
ALT: 166 U/L — ABNORMAL HIGH (ref 14–54)
AST: 154 U/L — ABNORMAL HIGH (ref 15–41)
Albumin: 3.7 g/dL (ref 3.5–5.0)
Alkaline Phosphatase: 167 U/L — ABNORMAL HIGH (ref 38–126)
Bilirubin, Direct: 0.1 mg/dL (ref 0.1–0.5)
Indirect Bilirubin: 0.1 mg/dL — ABNORMAL LOW (ref 0.3–0.9)
Total Bilirubin: 0.2 mg/dL — ABNORMAL LOW (ref 0.3–1.2)
Total Protein: 7.8 g/dL (ref 6.5–8.1)

## 2015-11-03 LAB — APTT: aPTT: 34 seconds (ref 24–37)

## 2015-11-03 LAB — PROTIME-INR
INR: 1.03 (ref 0.00–1.49)
Prothrombin Time: 13.7 seconds (ref 11.6–15.2)

## 2015-11-03 LAB — LIPASE, BLOOD: Lipase: 23 U/L (ref 11–51)

## 2015-11-03 MED ORDER — SODIUM CHLORIDE 0.9 % IV BOLUS (SEPSIS)
1000.0000 mL | Freq: Once | INTRAVENOUS | Status: AC
  Administered 2015-11-03: 1000 mL via INTRAVENOUS

## 2015-11-03 MED ORDER — SODIUM CHLORIDE 0.9 % IV SOLN
INTRAVENOUS | Status: DC
  Administered 2015-11-03: 22:00:00 via INTRAVENOUS

## 2015-11-03 MED ORDER — MORPHINE SULFATE (PF) 4 MG/ML IV SOLN
4.0000 mg | Freq: Once | INTRAVENOUS | Status: AC
  Administered 2015-11-04: 4 mg via INTRAVENOUS
  Filled 2015-11-03: qty 1

## 2015-11-03 MED ORDER — DEXTROSE 5 % IV SOLN
2.0000 g | Freq: Once | INTRAVENOUS | Status: AC
  Administered 2015-11-04: 2 g via INTRAVENOUS
  Filled 2015-11-03: qty 2

## 2015-11-03 MED ORDER — IOHEXOL 350 MG/ML SOLN
100.0000 mL | Freq: Once | INTRAVENOUS | Status: AC | PRN
  Administered 2015-11-03: 100 mL via INTRAVENOUS

## 2015-11-03 MED ORDER — MORPHINE SULFATE (PF) 4 MG/ML IV SOLN
4.0000 mg | Freq: Once | INTRAVENOUS | Status: AC
  Administered 2015-11-03: 4 mg via INTRAVENOUS
  Filled 2015-11-03: qty 1

## 2015-11-03 MED ORDER — ONDANSETRON HCL 4 MG/2ML IJ SOLN
4.0000 mg | Freq: Once | INTRAMUSCULAR | Status: AC
  Administered 2015-11-03: 4 mg via INTRAVENOUS
  Filled 2015-11-03: qty 2

## 2015-11-03 MED ORDER — HYDROMORPHONE HCL 1 MG/ML IJ SOLN
1.0000 mg | Freq: Once | INTRAMUSCULAR | Status: DC
  Filled 2015-11-03: qty 1
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
## 2015-11-03 NOTE — ED Notes (Addendum)
Pt reports central CP since yesterday accompanied by cough and L arm swelling. Hx of blood clots. Symptoms associated with SOB, lightheadedness, weakness and n/v. Pt not currently on blood thinners. Pt reports she took tylenol prior to arrival.

## 2015-11-04 ENCOUNTER — Observation Stay (HOSPITAL_COMMUNITY): Payer: Medicare Other

## 2015-11-04 ENCOUNTER — Encounter (HOSPITAL_COMMUNITY): Payer: Self-pay | Admitting: Family Medicine

## 2015-11-04 DIAGNOSIS — N390 Urinary tract infection, site not specified: Secondary | ICD-10-CM | POA: Insufficient documentation

## 2015-11-04 DIAGNOSIS — C50912 Malignant neoplasm of unspecified site of left female breast: Secondary | ICD-10-CM

## 2015-11-04 DIAGNOSIS — E1149 Type 2 diabetes mellitus with other diabetic neurological complication: Secondary | ICD-10-CM

## 2015-11-04 DIAGNOSIS — E669 Obesity, unspecified: Secondary | ICD-10-CM

## 2015-11-04 DIAGNOSIS — M7989 Other specified soft tissue disorders: Secondary | ICD-10-CM

## 2015-11-04 DIAGNOSIS — C7802 Secondary malignant neoplasm of left lung: Secondary | ICD-10-CM

## 2015-11-04 DIAGNOSIS — M79602 Pain in left arm: Secondary | ICD-10-CM

## 2015-11-04 DIAGNOSIS — I10 Essential (primary) hypertension: Secondary | ICD-10-CM | POA: Diagnosis not present

## 2015-11-04 DIAGNOSIS — J189 Pneumonia, unspecified organism: Secondary | ICD-10-CM | POA: Insufficient documentation

## 2015-11-04 DIAGNOSIS — G4730 Sleep apnea, unspecified: Secondary | ICD-10-CM

## 2015-11-04 LAB — GLUCOSE, CAPILLARY
Glucose-Capillary: 215 mg/dL — ABNORMAL HIGH (ref 65–99)
Glucose-Capillary: 184 mg/dL — ABNORMAL HIGH (ref 65–99)
Glucose-Capillary: 148 mg/dL — ABNORMAL HIGH (ref 65–99)
Glucose-Capillary: 237 mg/dL — ABNORMAL HIGH (ref 65–99)

## 2015-11-04 LAB — URINALYSIS, ROUTINE W REFLEX MICROSCOPIC
Bilirubin Urine: NEGATIVE
Glucose, UA: NEGATIVE mg/dL
Ketones, ur: NEGATIVE mg/dL
Nitrite: NEGATIVE
Protein, ur: 100 mg/dL — AB
Specific Gravity, Urine: 1.021 (ref 1.005–1.030)
pH: 5.5 (ref 5.0–8.0)

## 2015-11-04 LAB — CBC
HCT: 40.5 % (ref 36.0–46.0)
Hemoglobin: 12.8 g/dL (ref 12.0–15.0)
MCH: 27.4 pg (ref 26.0–34.0)
MCHC: 31.6 g/dL (ref 30.0–36.0)
MCV: 86.5 fL (ref 78.0–100.0)
Platelets: 214 10*3/uL (ref 150–400)
RBC: 4.68 MIL/uL (ref 3.87–5.11)
RDW: 14.8 % (ref 11.5–15.5)
WBC: 3.3 10*3/uL — ABNORMAL LOW (ref 4.0–10.5)

## 2015-11-04 LAB — URINE MICROSCOPIC-ADD ON: Squamous Epithelial / LPF: NONE SEEN

## 2015-11-04 LAB — CREATININE, SERUM
Creatinine, Ser: 0.92 mg/dL (ref 0.44–1.00)
GFR calc Af Amer: >60 mL/min (ref >60)
GFR calc non Af Amer: >60 mL/min (ref >60)

## 2015-11-04 MED ORDER — OXYCODONE HCL 5 MG PO TABS
10.0000 mg | ORAL_TABLET | ORAL | Status: DC | PRN
  Filled 2015-11-04 (×2): qty 2

## 2015-11-04 MED ORDER — LETROZOLE 2.5 MG PO TABS
2.5000 mg | ORAL_TABLET | Freq: Every day | ORAL | Status: DC
  Administered 2015-11-05 – 2015-11-09 (×5): 2.5 mg via ORAL
  Filled 2015-11-04 (×6): qty 1

## 2015-11-04 MED ORDER — INSULIN GLARGINE 100 UNIT/ML ~~LOC~~ SOLN
28.0000 [IU] | Freq: Every day | SUBCUTANEOUS | Status: DC
  Administered 2015-11-04 – 2015-11-08 (×5): 28 [IU] via SUBCUTANEOUS
  Filled 2015-11-04 (×6): qty 0.28

## 2015-11-04 MED ORDER — VANCOMYCIN HCL IN DEXTROSE 1-5 GM/200ML-% IV SOLN
1000.0000 mg | Freq: Once | INTRAVENOUS | Status: DC
  Filled 2015-11-04: qty 200

## 2015-11-04 MED ORDER — ENOXAPARIN SODIUM 40 MG/0.4ML ~~LOC~~ SOLN
40.0000 mg | SUBCUTANEOUS | Status: DC
  Administered 2015-11-04 – 2015-11-09 (×6): 40 mg via SUBCUTANEOUS
  Filled 2015-11-04 (×6): qty 0.4

## 2015-11-04 MED ORDER — ONDANSETRON HCL 4 MG/2ML IJ SOLN
4.0000 mg | Freq: Four times a day (QID) | INTRAMUSCULAR | Status: DC | PRN
Start: 2015-11-04 — End: 2015-11-09
  Administered 2015-11-04 – 2015-11-09 (×5): 4 mg via INTRAVENOUS
  Filled 2015-11-04 (×5): qty 2

## 2015-11-04 MED ORDER — INSULIN ASPART 100 UNIT/ML ~~LOC~~ SOLN
0.0000 [IU] | Freq: Three times a day (TID) | SUBCUTANEOUS | Status: DC
  Administered 2015-11-04: 8 [IU] via SUBCUTANEOUS
  Administered 2015-11-04: 5 [IU] via SUBCUTANEOUS
  Administered 2015-11-04: 2 [IU] via SUBCUTANEOUS
  Administered 2015-11-05 – 2015-11-06 (×4): 3 [IU] via SUBCUTANEOUS
  Administered 2015-11-06: 5 [IU] via SUBCUTANEOUS
  Administered 2015-11-06: 2 [IU] via SUBCUTANEOUS
  Administered 2015-11-07 (×2): 3 [IU] via SUBCUTANEOUS
  Administered 2015-11-07 – 2015-11-08 (×4): 2 [IU] via SUBCUTANEOUS
  Administered 2015-11-09 (×2): 3 [IU] via SUBCUTANEOUS

## 2015-11-04 MED ORDER — PROMETHAZINE HCL 25 MG/ML IJ SOLN
12.5000 mg | Freq: Four times a day (QID) | INTRAMUSCULAR | Status: DC | PRN
  Administered 2015-11-04: 12.5 mg via INTRAVENOUS
  Filled 2015-11-04: qty 1

## 2015-11-04 MED ORDER — DEXTROSE 5 % IV SOLN
1.0000 g | Freq: Three times a day (TID) | INTRAVENOUS | Status: DC
  Administered 2015-11-04 – 2015-11-08 (×10): 1 g via INTRAVENOUS
  Filled 2015-11-04 (×13): qty 1

## 2015-11-04 MED ORDER — INSULIN ASPART 100 UNIT/ML ~~LOC~~ SOLN
0.0000 [IU] | Freq: Every day | SUBCUTANEOUS | Status: DC
  Administered 2015-11-04: 2 [IU] via SUBCUTANEOUS

## 2015-11-04 MED ORDER — SODIUM CHLORIDE 0.9% FLUSH
10.0000 mL | INTRAVENOUS | Status: DC | PRN
  Administered 2015-11-08 – 2015-11-09 (×2): 10 mL
  Filled 2015-11-04 (×2): qty 40

## 2015-11-04 MED ORDER — OXYCODONE HCL ER 10 MG PO T12A
10.0000 mg | EXTENDED_RELEASE_TABLET | Freq: Two times a day (BID) | ORAL | Status: DC
  Administered 2015-11-05 – 2015-11-09 (×8): 10 mg via ORAL
  Filled 2015-11-04 (×10): qty 1

## 2015-11-04 MED ORDER — IPRATROPIUM-ALBUTEROL 0.5-2.5 (3) MG/3ML IN SOLN: 3.0000 mL | Freq: Four times a day (QID) | RESPIRATORY_TRACT | Status: DC | PRN

## 2015-11-04 MED ORDER — ONDANSETRON HCL 4 MG PO TABS: 4.0000 mg | ORAL_TABLET | Freq: Three times a day (TID) | ORAL | Status: DC | PRN

## 2015-11-04 MED ORDER — MORPHINE SULFATE (PF) 2 MG/ML IV SOLN
1.0000 mg | INTRAVENOUS | Status: DC | PRN
  Administered 2015-11-04 – 2015-11-05 (×2): 2 mg via INTRAVENOUS
  Filled 2015-11-04 (×2): qty 1

## 2015-11-04 NOTE — ED Notes (Signed)
Received call room had been changed when entering 4W.  Pt transported back to room 3 in ED

## 2015-11-04 NOTE — Progress Notes (Signed)
Patient refuses nocturnal CPAP tonight. She states she has had a sleep study in the past, which showed she only needs oxygen during sleep and she does not use a CPAP at home. She is aware that she may call for RT assistance if she should change her mind and like to try wearing it. RT will continue to follow.

## 2015-11-04 NOTE — ED Notes (Signed)
Pt transported to 1435 by RN.  Pt remains on monitor & oxygen.

## 2015-11-04 NOTE — Progress Notes (Signed)
Pharmacy Antibiotic Note  [name] is a 65 y.o. female admitted on 11/03/2015 with UTI.  Pharmacy has been consulted for Aztreonam dosing.  Plan: Aztreonam 1g IV q8. Note PCN anaphylaxis allergy  Height: '5\' 4"'$  (162.6 cm) Weight: (!) 310 lb (140.615 kg) IBW/kg (Calculated) : 54.7  Temp (24hrs), Avg:99.1 F (37.3 C), Min:97.6 F (36.4 C), Max:100.2 F (37.9 C)   Recent Labs Lab 11/03/15 1930 11/04/15 0551  WBC 4.0 3.3*  CREATININE 0.93 0.92    Estimated Creatinine Clearance: 85.8 mL/min (by C-G formula based on Cr of 0.92).     Thank you for allowing pharmacy to be a part of this patient's care.   Adrian Saran, PharmD, BCPS Pager [phone] 11/04/2015 5:23 PM

## 2015-11-04 NOTE — Progress Notes (Addendum)
VASCULAR LAB PRELIMINARY  PRELIMINARY  PRELIMINARY  PRELIMINARY  Left upper extremity venous duplex completed.    Preliminary report:  Left :  No obvious evidence of  Acute DVT or superficial thrombosis. Technically difficult due to body habitus.   Darren Nodal, RVS 11/04/2015, 9:49 AM

## 2015-11-04 NOTE — Progress Notes (Signed)
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 Loleta Books. Have seen and examined the patient, chart and data reviewed. Right upper extremity swelling, Doppler's negative. Has UTI started on aztreonam, follow culture.  Code Status: Full Code Family Communication: Plan discussed with the patient. Disposition Plan: Remains inpatient Diet: Diet Carb Modified Fluid consistency:: Thin; Room service appropriate?: Yes  Consultants:  None  Procedures:  None  Antibiotics:  Aztreonam (indicate start date, and stop date if known)   Objective: Filed Vitals:   11/04/15 0222 11/04/15 0353  BP: 132/66 155/66  Pulse: 77 82  Temp:  100.2 F (37.9 C)  Resp: 20 18    Intake/Output Summary (Last 24 hours) at 11/04/15 1210 Last data filed at 11/04/15 1024  Gross per 24 hour  Intake    120 ml  Output    250 ml  Net   -130 ml   Filed Weights   11/04/15 0016  Weight: 140.615 kg (310 lb)    Exam: General: Alert and awake, oriented x3, not in any acute distress. HEENT: anicteric sclera, pupils reactive to light and accommodation, EOMI CVS: S1-S2 clear, no murmur rubs or gallops Chest: clear to auscultation bilaterally, no wheezing, rales or rhonchi Abdomen: soft nontender, nondistended, normal bowel sounds, no organomegaly Extremities: no cyanosis, clubbing or edema noted bilaterally Neuro: Cranial nerves II-XII intact, no focal neurological deficits  Data Reviewed: Basic Metabolic Panel:  Recent Labs Lab 11/03/15 1930 11/04/15 0551  NA 134*   --   K 4.1  --   CL 95*  --   CO2 27  --   GLUCOSE 289*  --   BUN 17  --   CREATININE 0.93 0.92  CALCIUM 9.3  --    Liver Function Tests:  Recent Labs Lab 11/03/15 1930  AST 154*  ALT 166*  ALKPHOS 167*  BILITOT 0.2*  PROT 7.8  ALBUMIN 3.7    Recent Labs Lab 11/03/15 1930  LIPASE 23   No results for input(s): AMMONIA in the last 168 hours. CBC:  Recent Labs Lab 11/03/15 1930 11/04/15 0551  WBC 4.0 3.3*  HGB 14.9 12.8  HCT 44.2 40.5  MCV 84.0 86.5  PLT 242 214   Cardiac Enzymes: No results for input(s): CKTOTAL, CKMB, CKMBINDEX, TROPONINI in the last 168 hours. BNP (last 3 results) No results for input(s): BNP in the last 8760 hours.  ProBNP (last 3 results) No results for input(s): PROBNP in the last 8760 hours.  CBG:  Recent Labs Lab 11/04/15 0820 11/04/15 1205  GLUCAP 215* 184*    Micro No results found for this or any previous visit (from the past 240 hour(s)).   Studies: Dg Chest 2 View  11/03/2015  CLINICAL DATA:  Acute onset of central chest pain, cough and left arm swelling. Shortness of breath, lightheadedness, generalized weakness, nausea and vomiting. Initial encounter. EXAM: CHEST  2 VIEW COMPARISON:  Chest radiograph performed 09/28/2014, and CT of the chest performed 07/05/2015 FINDINGS: The lungs are well-aerated. Mild bibasilar opacities may reflect atelectasis or possibly mild pneumonia, depending on the patient's symptoms.  Mild vascular congestion is seen. There is no evidence of pleural effusion or pneumothorax. The heart is borderline normal in size. A right-sided chest port is noted ending about the distal SVC. No acute osseous abnormalities are seen. IMPRESSION: Mild bibasilar opacities may reflect atelectasis or possibly mild pneumonia, depending on the patient's symptoms. Mild vascular congestion seen. Electronically Signed   By: Garald Balding M.D.   On: 11/03/2015 19:34   Ct Angio Chest Pe W/cm &/or Wo Cm  11/03/2015  CLINICAL  DATA:  Central chest pain and cough, onset yesterday. EXAM: CT ANGIOGRAPHY CHEST WITH CONTRAST TECHNIQUE: Multidetector CT imaging of the chest was performed using the standard protocol during bolus administration of intravenous contrast. Multiplanar CT image reconstructions and MIPs were obtained to evaluate the vascular anatomy. CONTRAST:  126m OMNIPAQUE IOHEXOL 350 MG/ML SOLN COMPARISON:  07/05/2015 FINDINGS: Cardiovascular: There is good opacification of the pulmonary arteries. There is no pulmonary embolism. The thoracic aorta is normal in caliber and intact. Lungs: Minimal pleural thickening in the fissures, as well as mild unchanged curvilinear scarring. This is stable from 07/05/2015. Central airways: Patent Effusions: None Lymphadenopathy: Nonspecific nodes in the mediastinum and hila. No pathologic adenopathy. A few physiologic appearing nodes are visible in the axillary regions bilaterally. Esophagus: Unremarkable Upper abdomen: No significant abnormality Musculoskeletal: No significant abnormality mild degenerative thoracic disc disease. Review of the MIP images confirms the above findings. IMPRESSION: Negative for acute pulmonary embolism. Unchanged mild curvilinear scarring bilaterally. Electronically Signed   By: DAndreas NewportM.D.   On: 11/03/2015 23:53    Scheduled Meds: . enoxaparin (LOVENOX) injection  40 mg Subcutaneous Q24H  . insulin aspart  0-15 Units Subcutaneous TID WC  . insulin aspart  0-5 Units Subcutaneous QHS  . insulin glargine  28 Units Subcutaneous Q2200  . letrozole  2.5 mg Oral Daily  . oxyCODONE  10 mg Oral Q12H   Continuous Infusions:      Time spent: 35 minutes    EGuthrie County HospitalA  [facility] Pager [phone] 7PM-7AM, please contact night-coverage at [email or web address], password [email or web address]3/19/2017, 12:10 PM

## 2015-11-04 NOTE — H&P (Signed)
History and Physical  Patient Name: [name]     BSJ:[id number]    DOB: May 24, 1951    DOA: 11/03/2015 Referring physician: Quintella Reichert, MD PCP: [name], MD      Chief Complaint: Right arm swelling  HPI: [name] is a 66 y.o. female with a past medical history significant for metastatic breast Cancer, hx of LUE DVT, IDDM and HTN who presents with left arm swelling and pain.  History is collected from the patient's niece as well as the patient, because of patient's somnolence from morphine. The patient called her niece today because she was having new left arm pain, swelling, and back pain, similar to when she had her upper extremity DVT 2 years ago when her cancer was diagnosed.  The pain was located "everywhere" in her left arm, vague in character and moderate to severe in intensity.  There was no redness, but she felt it was swollen relative to the right.  In the ED, she had a low grade temperature and was hemodynamically stable.  A chest x-ray was difficult to interpret but a CT angiogram was negative for PE, dissection, or pneumonia.  TRH were asked to admit to observation so that the patient could undergo a vascular study of her left arm for ruling out DVT.  There was initial concern by ER of both HCAP and UTI.  The former appeared to have been ruled out by CT and clinical exam, and the latter appears to be asymptomatic bacteriuria.     Review of Systems:  Pt complains of left arm pain, swelling, back pain, chronic wheezing, chronic dyspnea. Pt denies any fever, chills, sputum, chest pain.  All other systems negative except as just noted or noted in the history of present illness.  Allergies  Allergen Reactions  . Meperidine Hcl Anaphylaxis  . Penicillins Anaphylaxis      . Amoxicillin     REACTION: unspecified  . Aspirin Nausea And Vomiting    REACTION: unspecified  . Percocet [Oxycodone-Acetaminophen]     Prior to Admission medications   Medication  Sig Start Date End Date Taking? Authorizing Provider  amLODipine (NORVASC) 5 MG tablet TAKE 1 TABLET BY MOUTH EVERY DAY Patient taking differently: TAKE 5 MG BY MOUTH EVERY DAY 07/18/15  Yes [name], MD  glucose blood test strip Use as instructed 10/24/14  Yes [name], MD  insulin aspart (NOVOLOG FLEXPEN) 100 UNIT/ML FlexPen Inject 10 Units into the skin 3 (three) times daily with meals. 03/05/15  Yes Kinnie Feil, MD  ipratropium-albuterol (DUONEB) 0.5-2.5 (3) MG/3ML SOLN Take 3 mLs by nebulization every 6 (six) hours. Patient taking differently: Take 3 mLs by nebulization every 6 (six) hours as needed (SOB, wheezing).  10/01/14  Yes Christina P Rama, MD  LANTUS SOLOSTAR 100 UNIT/ML Solostar Pen INJECT 28 UNITS INTO THE SKIN DAILY AT 10 PM. PLEASE DISPENSE NEEDLES, #30 PER MONTH. 08/28/15  Yes [name], MD  letrozole (FEMARA) 2.5 MG tablet TAKE 1 TABLET (2.5 MG TOTAL) BY MOUTH DAILY. 01/21/15  Yes Chauncey Cruel, MD  losartan-hydrochlorothiazide (HYZAAR) 100-12.5 MG per tablet Take 1 tablet by mouth daily. 04/30/15  Yes [name], MD  oxyCODONE (OXYCONTIN) 10 mg 12 hr tablet Take 1 tablet (10 mg total) by mouth every 12 (twelve) hours. 10/31/15  Yes Laurie Panda, NP  OXYGEN Inhale into the lungs.   Yes Historical Provider, MD  PROAIR HFA 108 (90 BASE) MCG/ACT inhaler INHALE 2 PUFFS  INTO THE LUNGS EVERY 6 (SIX) HOURS AS NEEDED FOR WHEEZING. 09/19/14  Yes [name], MD  aspirin 81 MG EC tablet TAKE 1 TABLET BY MOUTH DAILY Patient not taking: Reported on 11/03/2015 07/26/14   [name], MD  nitrofurantoin, macrocrystal-monohydrate, (MACROBID) 100 MG capsule Take 1 capsule (100 mg total) by mouth 2 (two) times daily. Patient not taking: Reported on 11/03/2015 10/12/15   Chauncey Cruel, MD  traMADol (ULTRAM) 50 MG tablet Take 1 tablet (50 mg total) by mouth every 6 (six) hours as needed. Patient not taking: Reported on 07/17/2015  06/21/15   Chauncey Cruel, MD    Past Medical History  Diagnosis Date  . Hypertension   . Other abnormal glucose   . Obesity, unspecified   . Unspecified sleep apnea   . Syncope and collapse   . Chest pain   . Suicide attempt ([place]) 1996  . Fatty liver 6/03  . Lung disease   . Arthritis   . Back pain   . Diabetes mellitus without complication ([place]) 3/61/4431  . Breast cancer ([place]) dx'd 11/2012    left    Past Surgical History  Procedure Laterality Date  . Cardiac catheterization      2007  . Cholecystectomy    . Tubal ligation      Family history: family history includes Breast cancer (age of onset: 56) in her mother; Cancer (age of onset: 77) in her maternal aunt; Cancer (age of onset: 5) in her mother; Cancer (age of onset: 44) in her maternal grandmother; Cancer (age of onset: 98) in her paternal aunt; Coronary artery disease (age of onset: 66) in her brother, father, and sister; Diabetes in her father; Heart disease in her father.  Social History: Patient lives alone. She   reports that she quit smoking about 23 years ago. She has never used smokeless tobacco. She reports that she does not drink alcohol or use illicit drugs.     Physical Exam: BP 109/62 mmHg  Pulse 82  Temp(Src) 99.5 F (37.5 C) (Oral)  Resp 20  Ht '5\' 4"'$  (1.626 m)  Wt 140.615 kg (310 lb)  BMI 53.19 kg/m2  SpO2 92% General appearance: Morbidly obese adult female, somnolent and slow to rouse, but in no acute distress and responsive to questions, oriented.   Eyes: Anicteric, conjunctiva pink, lids and lashes normal.     ENT: No nasal deformity, discharge, or epistaxis.  OP moist without lesions.   Skin: Warm and dry.  No jaundice.  No suspicious rashes or lesions, some hyperpigmentation of the left triceps, without induration or warmth or pain. Cardiac: RRR, nl S1-S2, no murmurs appreciated.  Capillary refill is brisk.  JVP not visible.  No LE edema.  Respiratory: Normal respiratory rate and  rhythm.  Wheezes bilaterally, difficult to appreciate air movement given habitus. Abdomen: Abdomen soft without rigidity.  No TTP. No ascites, distension.   MSK: No deformities or effusions.  No tenseness or redness of LUE. Neuro: Sluggish and somnolent.  Rouses and oritentd to person, place and situation.   Moves all extremities equally except left arm, which is from an old injury.  Globally weak.    Psych: Behavior tired but appropriate.  Affect normal.  No evidence of aural or visual hallucinations or delusions.       Labs on Admission:  The metabolic panel shows mild hyponatremia. The transaminases are slightly higher than previous. The complete blood count shows no leukocytosis, anemia, thrombocytopenia. Coags normal. Lipase  normal. Troponin negative 2. Urinalysis shows pyuria and bacteria.   Radiological Exams on Admission: Personally reviewed: Dg Chest 2 View  11/03/2015  CLINICAL DATA:  Acute onset of central chest pain, cough and left arm swelling. Shortness of breath, lightheadedness, generalized weakness, nausea and vomiting. Initial encounter. EXAM: CHEST  2 VIEW COMPARISON:  Chest radiograph performed 09/28/2014, and CT of the chest performed 07/05/2015 FINDINGS: The lungs are well-aerated. Mild bibasilar opacities may reflect atelectasis or possibly mild pneumonia, depending on the patient's symptoms. Mild vascular congestion is seen. There is no evidence of pleural effusion or pneumothorax. The heart is borderline normal in size. A right-sided chest port is noted ending about the distal SVC. No acute osseous abnormalities are seen. IMPRESSION: Mild bibasilar opacities may reflect atelectasis or possibly mild pneumonia, depending on the patient's symptoms. Mild vascular congestion seen. Electronically Signed   By: Garald Balding M.D.   On: 11/03/2015 19:34   Ct Angio Chest Pe W/cm &/or Wo Cm  11/03/2015  CLINICAL DATA:  Central chest pain and cough, onset yesterday. EXAM: CT  ANGIOGRAPHY CHEST WITH CONTRAST TECHNIQUE: Multidetector CT imaging of the chest was performed using the standard protocol during bolus administration of intravenous contrast. Multiplanar CT image reconstructions and MIPs were obtained to evaluate the vascular anatomy. CONTRAST:  125m OMNIPAQUE IOHEXOL 350 MG/ML SOLN COMPARISON:  07/05/2015 FINDINGS: Cardiovascular: There is good opacification of the pulmonary arteries. There is no pulmonary embolism. The thoracic aorta is normal in caliber and intact. Lungs: Minimal pleural thickening in the fissures, as well as mild unchanged curvilinear scarring. This is stable from 07/05/2015. Central airways: Patent Effusions: None Lymphadenopathy: Nonspecific nodes in the mediastinum and hila. No pathologic adenopathy. A few physiologic appearing nodes are visible in the axillary regions bilaterally. Esophagus: Unremarkable Upper abdomen: No significant abnormality Musculoskeletal: No significant abnormality mild degenerative thoracic disc disease. Review of the MIP images confirms the above findings. IMPRESSION: Negative for acute pulmonary embolism. Unchanged mild curvilinear scarring bilaterally. Electronically Signed   By: DAndreas NewportM.D.   On: 11/03/2015 23:53    EKG: Independently reviewed. Rate 81, sinus rhythm. Bifascicular block. No ST changes.    Assessment/Plan 1. Left arm pain, swelling:  This is new.  Infection/cellulitis is doubted on my exam.  Also possibly muscle strain.  Radiculopathy within differential, potentially, if persisting.  She is clear to deny chest pain to me.  CTA reassuring for no PE, pneumonia or dissection.  ACS doubted. -RUE ultrasound for DVT tomorrow   2. MO/OHS:  Observed to have desaturations while sleeping.  She is morbidly obese, and likely has some OSA and OHS. -CPAP overnight while in hospital -Recommend follow up with PCP regarding sleep apnea  3. Metastatic Br CA:  Initially diagnosed incidentally in 2014  in the context of her LUE arm pain and UE DVT.  Has been undergoing treatment with letrozole, trastuzumab and pertuzumab lately, follwed by Dr. MJana Hakim -Continue letrozole  4. Pyuria:  Asymptomatic pyuria at present.   -Sent for culture   -Discontinue antibiotics for now  5. IDDM:  -Continue home Lantus  -SSI with meals  6. URI:  There was question of PNA by CXR in ER, and patient started on therapy for HCAP.  I have discontinued this, as patient has mostly URI symptoms and wheezing, and CT chest shows no consolidation. -Continue supportive cares  7. HTN: Hold amlodipine and ARB/HCTZ until hemodynamics clearer    DVT PPx: Lovenox Diet: Diabetic Consultants: None Code Status: FULL Family Communication:  Niece, Glenard Haring, present at bedside.  Medical decision making: What exists of the patient's previous chart was reviewed in depth and the case was discussed with Dr. Tomi Bamberger. Patient seen 1:31 AM on 11/04/2015.  Disposition Plan:  I recommend admission to medical surgical bed, observation status.  Clinical condition: stable.  Anticipate VAS RUE tomorrow when vascular clinic open, and discharge if normal.      Edwin Dada [facility] Pager [phone]

## 2015-11-04 NOTE — ED Notes (Signed)
MD notified for medication for nausea. States he will place orders.

## 2015-11-05 ENCOUNTER — Observation Stay (HOSPITAL_COMMUNITY): Payer: Medicare Other

## 2015-11-05 ENCOUNTER — Encounter (HOSPITAL_COMMUNITY): Payer: Self-pay | Admitting: Radiology

## 2015-11-05 DIAGNOSIS — J9602 Acute respiratory failure with hypercapnia: Secondary | ICD-10-CM | POA: Diagnosis not present

## 2015-11-05 DIAGNOSIS — C50919 Malignant neoplasm of unspecified site of unspecified female breast: Secondary | ICD-10-CM | POA: Diagnosis present

## 2015-11-05 DIAGNOSIS — Z833 Family history of diabetes mellitus: Secondary | ICD-10-CM | POA: Diagnosis not present

## 2015-11-05 DIAGNOSIS — J069 Acute upper respiratory infection, unspecified: Secondary | ICD-10-CM | POA: Diagnosis present

## 2015-11-05 DIAGNOSIS — I5032 Chronic diastolic (congestive) heart failure: Secondary | ICD-10-CM | POA: Diagnosis not present

## 2015-11-05 DIAGNOSIS — E1149 Type 2 diabetes mellitus with other diabetic neurological complication: Secondary | ICD-10-CM | POA: Diagnosis not present

## 2015-11-05 DIAGNOSIS — Z794 Long term (current) use of insulin: Secondary | ICD-10-CM | POA: Diagnosis not present

## 2015-11-05 DIAGNOSIS — E669 Obesity, unspecified: Secondary | ICD-10-CM | POA: Diagnosis not present

## 2015-11-05 DIAGNOSIS — N390 Urinary tract infection, site not specified: Secondary | ICD-10-CM | POA: Diagnosis not present

## 2015-11-05 DIAGNOSIS — Z87891 Personal history of nicotine dependence: Secondary | ICD-10-CM | POA: Diagnosis not present

## 2015-11-05 DIAGNOSIS — C50912 Malignant neoplasm of unspecified site of left female breast: Secondary | ICD-10-CM | POA: Diagnosis not present

## 2015-11-05 DIAGNOSIS — Z6841 Body Mass Index (BMI) 40.0 and over, adult: Secondary | ICD-10-CM | POA: Diagnosis not present

## 2015-11-05 DIAGNOSIS — M7989 Other specified soft tissue disorders: Secondary | ICD-10-CM | POA: Diagnosis present

## 2015-11-05 DIAGNOSIS — M549 Dorsalgia, unspecified: Secondary | ICD-10-CM | POA: Diagnosis present

## 2015-11-05 DIAGNOSIS — E871 Hypo-osmolality and hyponatremia: Secondary | ICD-10-CM | POA: Diagnosis present

## 2015-11-05 DIAGNOSIS — B961 Klebsiella pneumoniae [K. pneumoniae] as the cause of diseases classified elsewhere: Secondary | ICD-10-CM | POA: Diagnosis present

## 2015-11-05 DIAGNOSIS — Z880 Allergy status to penicillin: Secondary | ICD-10-CM | POA: Diagnosis not present

## 2015-11-05 DIAGNOSIS — E872 Acidosis: Secondary | ICD-10-CM | POA: Diagnosis not present

## 2015-11-05 DIAGNOSIS — M79602 Pain in left arm: Secondary | ICD-10-CM | POA: Diagnosis not present

## 2015-11-05 DIAGNOSIS — E662 Morbid (severe) obesity with alveolar hypoventilation: Secondary | ICD-10-CM | POA: Diagnosis present

## 2015-11-05 DIAGNOSIS — C7800 Secondary malignant neoplasm of unspecified lung: Secondary | ICD-10-CM | POA: Diagnosis present

## 2015-11-05 DIAGNOSIS — J9811 Atelectasis: Secondary | ICD-10-CM | POA: Diagnosis present

## 2015-11-05 DIAGNOSIS — Z86718 Personal history of other venous thrombosis and embolism: Secondary | ICD-10-CM | POA: Diagnosis not present

## 2015-11-05 DIAGNOSIS — R000 Tachycardia, unspecified: Secondary | ICD-10-CM | POA: Diagnosis present

## 2015-11-05 DIAGNOSIS — G4730 Sleep apnea, unspecified: Secondary | ICD-10-CM | POA: Diagnosis not present

## 2015-11-05 DIAGNOSIS — Z79811 Long term (current) use of aromatase inhibitors: Secondary | ICD-10-CM | POA: Diagnosis not present

## 2015-11-05 DIAGNOSIS — Z8249 Family history of ischemic heart disease and other diseases of the circulatory system: Secondary | ICD-10-CM | POA: Diagnosis not present

## 2015-11-05 DIAGNOSIS — Z9981 Dependence on supplemental oxygen: Secondary | ICD-10-CM | POA: Diagnosis not present

## 2015-11-05 DIAGNOSIS — A419 Sepsis, unspecified organism: Secondary | ICD-10-CM | POA: Diagnosis present

## 2015-11-05 DIAGNOSIS — I10 Essential (primary) hypertension: Secondary | ICD-10-CM | POA: Diagnosis not present

## 2015-11-05 LAB — BASIC METABOLIC PANEL
Anion gap: 6 (ref 5–15)
BUN: 15 mg/dL (ref 6–20)
CO2: 35 mmol/L — ABNORMAL HIGH (ref 22–32)
Calcium: 8.5 mg/dL — ABNORMAL LOW (ref 8.9–10.3)
Chloride: 97 mmol/L — ABNORMAL LOW (ref 101–111)
Creatinine, Ser: 0.66 mg/dL (ref 0.44–1.00)
GFR calc Af Amer: >60 mL/min (ref >60)
GFR calc non Af Amer: >60 mL/min (ref >60)
Glucose, Bld: 246 mg/dL — ABNORMAL HIGH (ref 65–99)
Potassium: 3.9 mmol/L (ref 3.5–5.1)
Sodium: 138 mmol/L (ref 135–145)

## 2015-11-05 LAB — BLOOD GAS, ARTERIAL
Acid-Base Excess: 10.2 mmol/L — ABNORMAL HIGH (ref 0.0–2.0)
Bicarbonate: 40 mEq/L — ABNORMAL HIGH (ref 20.0–24.0)
Drawn by: 103701
O2 Content: 3 L/min
O2 Saturation: 94.6 %
Patient temperature: 100.9
TCO2: 36.4 mmol/L (ref 0–100)
pCO2 arterial: 90.4 mmHg (ref 35.0–45.0)
pH, Arterial: 7.277 — ABNORMAL LOW (ref 7.350–7.450)
pO2, Arterial: 80.7 mmHg (ref 80.0–100.0)

## 2015-11-05 LAB — GLUCOSE, CAPILLARY
Glucose-Capillary: 193 mg/dL — ABNORMAL HIGH (ref 65–99)
Glucose-Capillary: 184 mg/dL — ABNORMAL HIGH (ref 65–99)
Glucose-Capillary: 199 mg/dL — ABNORMAL HIGH (ref 65–99)

## 2015-11-05 LAB — CBC
HCT: 42.8 % (ref 36.0–46.0)
Hemoglobin: 12.9 g/dL (ref 12.0–15.0)
MCH: 27.4 pg (ref 26.0–34.0)
MCHC: 30.1 g/dL (ref 30.0–36.0)
MCV: 91.1 fL (ref 78.0–100.0)
Platelets: 219 10*3/uL (ref 150–400)
RBC: 4.7 MIL/uL (ref 3.87–5.11)
RDW: 15 % (ref 11.5–15.5)
WBC: 3.4 10*3/uL — ABNORMAL LOW (ref 4.0–10.5)

## 2015-11-05 MED ORDER — FUROSEMIDE 10 MG/ML IJ SOLN
40.0000 mg | Freq: Once | INTRAMUSCULAR | Status: AC
  Administered 2015-11-05: 40 mg via INTRAVENOUS
  Filled 2015-11-05: qty 4

## 2015-11-05 MED ORDER — VANCOMYCIN HCL 10 G IV SOLR
1250.0000 mg | Freq: Two times a day (BID) | INTRAVENOUS | Status: DC
  Administered 2015-11-06 – 2015-11-08 (×5): 1250 mg via INTRAVENOUS
  Filled 2015-11-05 (×6): qty 1250

## 2015-11-05 MED ORDER — IOHEXOL 300 MG/ML  SOLN
100.0000 mL | Freq: Once | INTRAMUSCULAR | Status: AC | PRN
  Administered 2015-11-05: 100 mL via INTRAVENOUS

## 2015-11-05 MED ORDER — VANCOMYCIN HCL 10 G IV SOLR
2500.0000 mg | Freq: Once | INTRAVENOUS | Status: AC
  Administered 2015-11-05: 2500 mg via INTRAVENOUS
  Filled 2015-11-05: qty 2000

## 2015-11-05 NOTE — Progress Notes (Signed)
RN never called RT to inform us of pt's return to room.  Arterial blood gas changed to a STAT order.

## 2015-11-05 NOTE — Significant Event (Signed)
FOLLOW UP:  I got a call from RN at 6:18 pm asking if I need to transfer the pt to SDU? Please note that I spoke with RRT to plcae to Bipap and put order for transfer at 5:43pm. I spoke with RN Wendie Chess, asking if I want to transfer the patient to SDU because she saw order for transfer but she did not know what area of the hospital. I explained to her that I placed the order to go to SDU at 5:43 pm and the patient needs the BiPAP STAT.   Birdie Hopes Pager: [phone] 11/05/2015, 6:24 PM

## 2015-11-05 NOTE — Progress Notes (Signed)
RT came to draw a routine Arterial blood gas. Pt gone for procedure. Request the RN to call when pt returned.

## 2015-11-05 NOTE — Significant Event (Signed)
ABG showed panic high results. Results for [name], [name] (MRN [id number]) as of 11/05/2015 17:45   11/05/2015 17:30  pH, Arterial  7.277 (L)  pCO2 arterial  90.4 (HH)  pO2, Arterial  80.7   Transfer to SDU and place on BiPAP. I spoke with RRT Megan.  [name] Pager: [phone] 11/05/2015, 5:47 PM

## 2015-11-05 NOTE — Progress Notes (Signed)
I ordered ABG at 2:10 pm and it was not back till 3:10 pm, so I re-placed the order as STAT. ABG STAT placed at 3:10 pm and it did not come back till now. I spoke with the RN and she reported nursing change. Await ABG, see my PN from earlier today  Birdie Hopes Pager: [phone] 11/05/2015, 5:33 PM

## 2015-11-05 NOTE — Progress Notes (Signed)
[facility] PROGRESS NOTE   MARKEIA HARKLESS GYK:[id number] DOB: May 06, 1951 DOA: 11/03/2015 PCP: [name], MD  HPI/Subjective: Patient was sleepy this morning, still complaining about left upper extremity pain. Patient tender around her left shoulder. Doppler is negative. Continues to have low-grade fever, last one is 100.9.  Assessment/Plan: Active Problems:   Obesity   HYPERTENSION, BENIGN SYSTEMIC   Sleep apnea   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 [facility])   Diabetes mellitus type 2 with neurological manifestations (H[facility])   Left arm pain   1. Left arm pain, swelling:  This is new. Infection/cellulitis is doubted on my exam. Also possibly muscle strain. LUE Dopplers negative for DVT. So far unclear etiology, patient has low-grade fever which could be secondary to UTI but will get CT scan of the left shoulder.  2. MO/OSA/OHS:  Observed by the admitting physician to have desaturations while sleeping.  She is morbidly obese, she is likely pickwickian and does have sleep apnea. Fused CPAP last night, she is very sleepy this morning especially after pain medications. We'll get an ABG, she might need BiPAP if CO2 is elevated. Give 1 dose of Lasix.  3. Metastatic Br CA:  Initially diagnosed incidentally in 2014 in the context of her LUE arm pain and UE DVT.  Has been undergoing treatment with letrozole, trastuzumab and pertuzumab lately, follwed by Dr. Jana Hakim. Continue letrozole  4. Pyuria:  Significant pyuria, treated recently for UTI. Started on aztreonam, has penicillin allergies.  5. IDDM:  -Continue home Lantus  -SSI with meals  6. URI:  There was question of PNA by CXR in ER, and patient started on therapy for HCAP. I have discontinued this, as patient has mostly URI symptoms and wheezing, and CT chest shows no consolidation. -Continue supportive cares  7. HTN: Hold amlodipine and ARB/HCTZ until hemodynamics clearer  8. Fever Low-grade  fever of 100.9, blood cultures obtained. Patient is on aztreonam for UTI. Started on vancomycin.  Code Status: Full Code Family Communication: Plan discussed with the patient. Disposition Plan: Remains inpatient Diet: Diet Carb Modified Fluid consistency:: Thin; Room service appropriate?: Yes  Consultants:  None  Procedures:  None  Antibiotics:  Aztreonam (indicate start date, and stop date if known)   Objective: Filed Vitals:   11/05/15 0800 11/05/15 1319  BP:  145/68  Pulse: 79 71  Temp:  100.9 F (38.3 C)  Resp:      Intake/Output Summary (Last 24 hours) at 11/05/15 1503 Last data filed at 11/05/15 1016  Gross per 24 hour  Intake    480 ml  Output    645 ml  Net   -165 ml   Filed Weights   11/04/15 0016  Weight: 140.615 kg (310 lb)    Exam: General: Alert and awake, oriented x3, not in any acute distress. HEENT: anicteric sclera, pupils reactive to light and accommodation, EOMI CVS: S1-S2 clear, no murmur rubs or gallops Chest: clear to auscultation bilaterally, no wheezing, rales or rhonchi Abdomen: soft nontender, nondistended, normal bowel sounds, no organomegaly Extremities: no cyanosis, clubbing or edema noted bilaterally Neuro: Cranial nerves II-XII intact, no focal neurological deficits  Data Reviewed: Basic Metabolic Panel:  Recent Labs Lab 11/03/15 1930 11/04/15 0551 11/05/15 0412  NA 134*  --  138  K 4.1  --  3.9  CL 95*  --  97*  CO2 27  --  35*  GLUCOSE 289*  --  246*  BUN 17  --  15  CREATININE 0.93 0.92 0.66  CALCIUM 9.3  --  8.5*   Liver Function Tests:  Recent Labs Lab 11/03/15 1930  AST 154*  ALT 166*  ALKPHOS 167*  BILITOT 0.2*  PROT 7.8  ALBUMIN 3.7    Recent Labs Lab 11/03/15 1930  LIPASE 23   No results for input(s): AMMONIA in the last 168 hours. CBC:  Recent Labs Lab 11/03/15 1930 11/04/15 0551 11/05/15 0412  WBC 4.0 3.3* 3.4*  HGB 14.9 12.8 12.9  HCT 44.2 40.5 42.8  MCV 84.0 86.5 91.1  PLT  242 214 219   Cardiac Enzymes: No results for input(s): CKTOTAL, CKMB, CKMBINDEX, TROPONINI in the last 168 hours. BNP (last 3 results) No results for input(s): BNP in the last 8760 hours.  ProBNP (last 3 results) No results for input(s): PROBNP in the last 8760 hours.  CBG:  Recent Labs Lab 11/04/15 1205 11/04/15 1711 11/04/15 2137 11/05/15 0741 11/05/15 1135  GLUCAP 184* 148* 237* 193* 184*    Micro Recent Results (from the past 240 hour(s))  Culture, Urine     Status: None (Preliminary result)   Collection Time: 11/03/15 11:32 PM  Result Value Ref Range Status   Specimen Description URINE, RANDOM  Final   Special Requests NONE  Final   Culture   Final    >=100,000 COLONIES/mL GRAM NEGATIVE RODS Performed at [facility]    Report Status PENDING  Incomplete     Studies: Dg Chest 2 View  11/03/2015  CLINICAL DATA:  Acute onset of central chest pain, cough and left arm swelling. Shortness of breath, lightheadedness, generalized weakness, nausea and vomiting. Initial encounter. EXAM: CHEST  2 VIEW COMPARISON:  Chest radiograph performed 09/28/2014, and CT of the chest performed 07/05/2015 FINDINGS: The lungs are well-aerated. Mild bibasilar opacities may reflect atelectasis or possibly mild pneumonia, depending on the patient's symptoms. Mild vascular congestion is seen. There is no evidence of pleural effusion or pneumothorax. The heart is borderline normal in size. A right-sided chest port is noted ending about the distal SVC. No acute osseous abnormalities are seen. IMPRESSION: Mild bibasilar opacities may reflect atelectasis or possibly mild pneumonia, depending on the patient's symptoms. Mild vascular congestion seen. Electronically Signed   By: Garald Balding M.D.   On: 11/03/2015 19:34   Ct Angio Chest Pe W/cm &/or Wo Cm  11/03/2015  CLINICAL DATA:  Central chest pain and cough, onset yesterday. EXAM: CT ANGIOGRAPHY CHEST WITH CONTRAST TECHNIQUE: Multidetector CT  imaging of the chest was performed using the standard protocol during bolus administration of intravenous contrast. Multiplanar CT image reconstructions and MIPs were obtained to evaluate the vascular anatomy. CONTRAST:  134m OMNIPAQUE IOHEXOL 350 MG/ML SOLN COMPARISON:  07/05/2015 FINDINGS: Cardiovascular: There is good opacification of the pulmonary arteries. There is no pulmonary embolism. The thoracic aorta is normal in caliber and intact. Lungs: Minimal pleural thickening in the fissures, as well as mild unchanged curvilinear scarring. This is stable from 07/05/2015. Central airways: Patent Effusions: None Lymphadenopathy: Nonspecific nodes in the mediastinum and hila. No pathologic adenopathy. A few physiologic appearing nodes are visible in the axillary regions bilaterally. Esophagus: Unremarkable Upper abdomen: No significant abnormality Musculoskeletal: No significant abnormality mild degenerative thoracic disc disease. Review of the MIP images confirms the above findings. IMPRESSION: Negative for acute pulmonary embolism. Unchanged mild curvilinear scarring bilaterally. Electronically Signed   By: DAndreas NewportM.D.   On: 11/03/2015 23:53    Scheduled Meds: . aztreonam  1 g Intravenous 3 times per day  . enoxaparin (  LOVENOX) injection  40 mg Subcutaneous Q24H  . furosemide  40 mg Intravenous Once  . insulin aspart  0-15 Units Subcutaneous TID WC  . insulin aspart  0-5 Units Subcutaneous QHS  . insulin glargine  28 Units Subcutaneous Q2200  . letrozole  2.5 mg Oral Daily  . oxyCODONE  10 mg Oral Q12H  . [START ON 11/06/2015] vancomycin  1,250 mg Intravenous Q12H  . vancomycin  2,500 mg Intravenous Once   Continuous Infusions:      Time spent: 35 minutes    [facility]  [facility] Pager [phone] If 7PM-7AM, please contact night-coverage at [email or web address], password [facility] 11/05/2015, 3:03 PM

## 2015-11-05 NOTE — Progress Notes (Signed)
Pharmacy Antibiotic Note  [name] is a 65 y.o. female admitted on 11/03/2015 with UTI and possible pneumonia.  Pharmacy has been consulted for aztreonam and vancomycin dosing.  Plan: Continue aztreonam 1g IV q8h. Vancomycin 2500 mg LD then 1250 mg IV q12h, using obese dosing nomogram.  Height: '5\' 4"'$  (162.6 cm) Weight: (!) 310 lb (140.615 kg) IBW/kg (Calculated) : 54.7  Temp (24hrs), Avg:99.2 F (37.3 C), Min:97.6 F (36.4 C), Max:100.9 F (38.3 C)   Recent Labs Lab 11/03/15 1930 11/04/15 0551 11/05/15 0412  WBC 4.0 3.3* 3.4*  CREATININE 0.93 0.92 0.66    Estimated Creatinine Clearance: 98.6 mL/min (by C-G formula based on Cr of 0.66).    Allergies  Allergen Reactions  . Meperidine Hcl Anaphylaxis  . Penicillins Anaphylaxis    Has patient had a PCN reaction causing immediate rash, facial/tongue/throat swelling, SOB or lightheadedness with hypotension: yes Has patient had a PCN reaction causing severe rash involving mucus membranes or skin necrosis: no Has patient had a PCN reaction that required hospitalization yes Has patient had a PCN reaction occurring within the last 10 years: no If all of the above answers are "NO", then may proceed with Cephalosporin use.   Marland Kitchen Amoxicillin     REACTION: unspecified  . Aspirin Nausea And Vomiting    REACTION: unspecified  . Percocet [Oxycodone-Acetaminophen]     Antimicrobials this admission: Azactam 3/19 >> Vancomycin 3/20 >>  Dose adjustments this admission: -  Microbiology results: 3/18 UCx: pending  Thank you for allowing pharmacy to be a part of this patient's care.  Hershal Coria 11/05/2015 2:13 PM

## 2015-11-06 LAB — GLUCOSE, CAPILLARY
Glucose-Capillary: 161 mg/dL — ABNORMAL HIGH (ref 65–99)
Glucose-Capillary: 161 mg/dL — ABNORMAL HIGH (ref 65–99)
Glucose-Capillary: 135 mg/dL — ABNORMAL HIGH (ref 65–99)
Glucose-Capillary: 214 mg/dL — ABNORMAL HIGH (ref 65–99)
Glucose-Capillary: 178 mg/dL — ABNORMAL HIGH (ref 65–99)
Glucose-Capillary: 183 mg/dL — ABNORMAL HIGH (ref 65–99)

## 2015-11-06 LAB — BASIC METABOLIC PANEL
Anion gap: 6 (ref 5–15)
BUN: 15 mg/dL (ref 6–20)
CO2: 39 mmol/L — ABNORMAL HIGH (ref 22–32)
Calcium: 8.4 mg/dL — ABNORMAL LOW (ref 8.9–10.3)
Chloride: 96 mmol/L — ABNORMAL LOW (ref 101–111)
Creatinine, Ser: 0.65 mg/dL (ref 0.44–1.00)
GFR calc Af Amer: >60 mL/min (ref >60)
GFR calc non Af Amer: >60 mL/min (ref >60)
Glucose, Bld: 161 mg/dL — ABNORMAL HIGH (ref 65–99)
Potassium: 3.4 mmol/L — ABNORMAL LOW (ref 3.5–5.1)
Sodium: 141 mmol/L (ref 135–145)

## 2015-11-06 LAB — BLOOD GAS, ARTERIAL
Acid-Base Excess: 12.1 mmol/L — ABNORMAL HIGH (ref 0.0–2.0)
Acid-Base Excess: 11.4 mmol/L — ABNORMAL HIGH (ref 0.0–2.0)
Bicarbonate: 41.2 mEq/L — ABNORMAL HIGH (ref 20.0–24.0)
Bicarbonate: 40.5 mEq/L — ABNORMAL HIGH (ref 20.0–24.0)
Delivery systems: POSITIVE
Delivery systems: POSITIVE
Drawn by: 11249
Drawn by: 441261
Expiratory PAP: 5
Expiratory PAP: 5
FIO2: 0.4
FIO2: 0.5
Inspiratory PAP: 12
Inspiratory PAP: 14
Mode: POSITIVE
O2 Saturation: 92.1 %
O2 Saturation: 95.4 %
Patient temperature: 99.9
Patient temperature: 98.6
RATE: 14 resp/min
RATE: 16 resp/min
TCO2: 37.4 mmol/L (ref 0–100)
TCO2: 36.7 mmol/L (ref 0–100)
pCO2 arterial: 82.8 mmHg (ref 35.0–45.0)
pCO2 arterial: 79.3 mmHg (ref 35.0–45.0)
pH, Arterial: 7.322 — ABNORMAL LOW (ref 7.350–7.450)
pH, Arterial: 7.328 — ABNORMAL LOW (ref 7.350–7.450)
pO2, Arterial: 65.6 mmHg — ABNORMAL LOW (ref 80.0–100.0)
pO2, Arterial: 77 mmHg — ABNORMAL LOW (ref 80.0–100.0)

## 2015-11-06 LAB — URINE CULTURE: Culture: >=100000

## 2015-11-06 LAB — MRSA PCR SCREENING: MRSA by PCR: POSITIVE — AB

## 2015-11-06 LAB — CBC
HCT: 42.3 % (ref 36.0–46.0)
Hemoglobin: 12.9 g/dL (ref 12.0–15.0)
MCH: 27.7 pg (ref 26.0–34.0)
MCHC: 30.5 g/dL (ref 30.0–36.0)
MCV: 90.8 fL (ref 78.0–100.0)
Platelets: 192 10*3/uL (ref 150–400)
RBC: 4.66 MIL/uL (ref 3.87–5.11)
RDW: 14.6 % (ref 11.5–15.5)
WBC: 3.7 10*3/uL — ABNORMAL LOW (ref 4.0–10.5)

## 2015-11-06 LAB — C DIFFICILE QUICK SCREEN W PCR REFLEX
C Diff antigen: NEGATIVE
C Diff interpretation: NEGATIVE
C Diff toxin: NEGATIVE

## 2015-11-06 MED ORDER — VITAMINS A & D EX OINT
TOPICAL_OINTMENT | CUTANEOUS | Status: AC
  Administered 2015-11-06: 5
  Filled 2015-11-06: qty 5

## 2015-11-06 MED ORDER — MAGNESIUM SULFATE 2 GM/50ML IV SOLN
2.0000 g | Freq: Once | INTRAVENOUS | Status: AC
  Administered 2015-11-06: 2 g via INTRAVENOUS
  Filled 2015-11-06: qty 50

## 2015-11-06 MED ORDER — POTASSIUM CHLORIDE CRYS ER 20 MEQ PO TBCR
40.0000 meq | EXTENDED_RELEASE_TABLET | Freq: Four times a day (QID) | ORAL | Status: AC
Start: 2015-11-06 — End: 2015-11-06
  Administered 2015-11-06 (×2): 40 meq via ORAL
  Filled 2015-11-06 (×2): qty 2

## 2015-11-06 MED ORDER — CHLORHEXIDINE GLUCONATE CLOTH 2 % EX PADS
6.0000 | MEDICATED_PAD | Freq: Every day | CUTANEOUS | Status: DC
  Administered 2015-11-06 – 2015-11-08 (×3): 6 via TOPICAL

## 2015-11-06 MED ORDER — FUROSEMIDE 10 MG/ML IJ SOLN
40.0000 mg | Freq: Once | INTRAMUSCULAR | Status: AC
  Administered 2015-11-06: 40 mg via INTRAVENOUS
  Filled 2015-11-06: qty 4

## 2015-11-06 MED ORDER — MUPIROCIN 2 % EX OINT
1.0000 "application " | TOPICAL_OINTMENT | Freq: Two times a day (BID) | CUTANEOUS | Status: DC
  Administered 2015-11-06 – 2015-11-09 (×8): 1 via NASAL
  Filled 2015-11-06 (×4): qty 22

## 2015-11-06 NOTE — Progress Notes (Signed)
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 She developed sleepiness yesterday, ABG showed PCO2 of 90. She transferred to stepdown and started on BiPAP, feels much better today, she will probably benefit from nightly CPAP.  Sepsis Temperature of 102, respiratory distress and presence of UTI. This is likely was present on admission without significant fever. Initially treated with IV fluids and poor spectrum antibiotics, currently on aztreonam and vancomycin.  UTI:  Presented with urinalysis consistent with UTI. Started on aztreonam, has penicillin allergies. Continue  Left arm pain, swelling:  This is new. Infection/cellulitis is doubted on my exam. Also possibly muscle strain. LUE Dopplers negative for DVT. So far unclear etiology, CT scan of the left shoulder showed no evidence of acute abnormalities.  MO/OSA/OHS:  Observed by the admitting physician to have desaturations while sleeping.  She is morbidly obese, she is likely pickwickian and does have sleep apnea. Fused CPAP last night, she is very sleepy this morning especially after pain medications. We'll get an ABG, she might need BiPAP if CO2 is elevated. Give 1 dose of Lasix.  Metastatic Br CA:  Initially diagnosed incidentally in 2014 in the context of her LUE arm pain and UE DVT.  Has been undergoing treatment with letrozole, trastuzumab and pertuzumab lately, follwed by Dr. Jana Hakim. Continue  letrozole  IDDM:  -Continue home Lantus  -SSI with meals  URI:  There was question of PNA by CXR in ER, and patient started on therapy for HCAP. I have discontinued this, as patient has mostly URI symptoms and wheezing, and CT chest shows no consolidation. -Continue supportive cares  HTN: Hold amlodipine and ARB/HCTZ until hemodynamics clearer  Fever Low-grade fever of 100.9, blood cultures obtained. Patient is on aztreonam for UTI. Started on vancomycin.   Code Status: Full Code Family Communication: Plan discussed with the patient. Disposition Plan: Remains inpatient Diet: Diet Carb Modified Fluid consistency:: Thin; Room service appropriate?: Yes  Consultants:  None  Procedures:  None  Antibiotics:  Aztreonam (indicate start date, and stop date if known)   Objective: Filed Vitals:   11/06/15 1000 11/06/15 1100  BP: 131/72 128/88  Pulse: 62 58  Temp:    Resp: 19 19    Intake/Output Summary (Last 24 hours) at 11/06/15 1242 Last data filed at 11/06/15 1100  Gross per 24 hour  Intake   1780 ml  Output    900 ml  Net    880 ml   Filed Weights   11/04/15 0016 11/05/15 1900 11/06/15 0500  Weight: 140.615 kg (310 lb) 141.6 kg (312 lb 2.7 oz) 141.8 kg (312 lb 9.8 oz)    Exam: General: Alert and awake, oriented x3, not in any acute distress. HEENT: anicteric sclera, pupils reactive to light and accommodation, EOMI CVS: S1-S2 clear, no murmur rubs or gallops Chest: clear to auscultation bilaterally, no wheezing, rales or rhonchi Abdomen: soft nontender, nondistended, normal bowel sounds, no organomegaly Extremities: no cyanosis, clubbing or edema noted bilaterally Neuro: Cranial nerves II-XII intact, no focal neurological deficits  Data Reviewed:  Basic Metabolic Panel:  Recent Labs Lab 11/03/15 1930 11/04/15 0551 11/05/15 0412 11/06/15 0430  NA 134*  --  138 141  K 4.1  --  3.9 3.4*  CL 95*  --  97* 96*  CO2 27  --  35* 39*  GLUCOSE 289*  --   246* 161*  BUN 17  --  15 15  CREATININE 0.93 0.92 0.66 0.65  CALCIUM 9.3  --  8.5* 8.4*   Liver Function Tests:  Recent Labs Lab 11/03/15 1930  AST 154*  ALT 166*  ALKPHOS 167*  BILITOT 0.2*  PROT 7.8  ALBUMIN 3.7    Recent Labs Lab 11/03/15 1930  LIPASE 23   No results for input(s): AMMONIA in the last 168 hours. CBC:  Recent Labs Lab 11/03/15 1930 11/04/15 0551 11/05/15 0412 11/06/15 0430  WBC 4.0 3.3* 3.4* 3.7*  HGB 14.9 12.8 12.9 12.9  HCT 44.2 40.5 42.8 42.3  MCV 84.0 86.5 91.1 90.8  PLT 242 214 219 192   Cardiac Enzymes: No results for input(s): CKTOTAL, CKMB, CKMBINDEX, TROPONINI in the last 168 hours. BNP (last 3 results) No results for input(s): BNP in the last 8760 hours.  ProBNP (last 3 results) No results for input(s): PROBNP in the last 8760 hours.  CBG:  Recent Labs Lab 11/05/15 1703 11/05/15 2122 11/06/15 0424 11/06/15 0745 11/06/15 1157  GLUCAP 199* 161* 161* 135* 214*    Micro Recent Results (from the past 240 hour(s))  Culture, Urine     Status: None   Collection Time: 11/03/15 11:32 PM  Result Value Ref Range Status   Specimen Description URINE, RANDOM  Final   Special Requests NONE  Final   Culture   Final    >=100,000 COLONIES/mL KLEBSIELLA PNEUMONIAE Performed at [facility]    Report Status 11/06/2015 FINAL  Final   Organism ID, Bacteria KLEBSIELLA PNEUMONIAE  Final      Susceptibility   Klebsiella pneumoniae - MIC*    AMPICILLIN >=32 RESISTANT Resistant     CEFAZOLIN <=4 SENSITIVE Sensitive     CEFTRIAXONE <=1 SENSITIVE Sensitive     CIPROFLOXACIN <=0.25 SENSITIVE Sensitive     GENTAMICIN <=1 SENSITIVE Sensitive     IMIPENEM <=0.25 SENSITIVE Sensitive     NITROFURANTOIN 32 SENSITIVE Sensitive     TRIMETH/SULFA <=20 SENSITIVE Sensitive     AMPICILLIN/SULBACTAM 4 SENSITIVE Sensitive     PIP/TAZO <=4 SENSITIVE Sensitive     * >=100,000 COLONIES/mL KLEBSIELLA PNEUMONIAE  MRSA PCR Screening     Status:  Abnormal   Collection Time: 11/05/15  7:14 PM  Result Value Ref Range Status   MRSA by PCR POSITIVE (A) NEGATIVE Final    Comment:        The GeneXpert MRSA Assay (FDA approved for NASAL specimens only), is one component of a comprehensive MRSA colonization surveillance program. It is not intended to diagnose MRSA infection nor to guide or monitor treatment for MRSA infections. RESULT CALLED TO, READ BACK BY AND VERIFIED WITH: B DAVIS RN @ 0454 ON 11/06/15 BY CDAVIS      Studies: Ct Shoulder Left W Contrast  11/05/2015  CLINICAL DATA:  Left shoulder pain and swelling. History of breast cancer. EXAM: CT OF THE LEFT SHOULDER WITH CONTRAST TECHNIQUE: Multidetector CT imaging was performed following the standard protocol during bolus administration of intravenous contrast. CONTRAST:  165m OMNIPAQUE IOHEXOL 300 MG/ML  SOLN COMPARISON:  None. FINDINGS: No acute fracture or dislocation. No lytic or sclerotic osseous lesion.  No bone destruction. The muscles are normal. There is no muscle atrophy. There is no fluid collection or hematoma. There is no soft tissue mass. There is no left axillary lymphadenopathy. The visualized left lung is clear. IMPRESSION: 1. No acute osseous abnormality of the left shoulder. Electronically Signed   By: Kathreen Devoid   On: 11/05/2015 16:06    Scheduled Meds: . aztreonam  1 g Intravenous 3 times per day  . Chlorhexidine Gluconate Cloth  6 each Topical Q0600  . enoxaparin (LOVENOX) injection  40 mg Subcutaneous Q24H  . insulin aspart  0-15 Units Subcutaneous TID WC  . insulin aspart  0-5 Units Subcutaneous QHS  . insulin glargine  28 Units Subcutaneous Q2200  . letrozole  2.5 mg Oral Daily  . mupirocin ointment  1 application Nasal BID  . oxyCODONE  10 mg Oral Q12H  . vancomycin  1,250 mg Intravenous Q12H   Continuous Infusions:      Time spent: 35 minutes    [facility] A  [facility] Pager [phone] If 7PM-7AM, please contact  night-coverage at [email or web address], password [facility] 11/06/2015, 12:42 PM  LOS: 1 day

## 2015-11-07 ENCOUNTER — Inpatient Hospital Stay (HOSPITAL_COMMUNITY): Payer: Medicare Other

## 2015-11-07 LAB — GLUCOSE, CAPILLARY
Glucose-Capillary: 128 mg/dL — ABNORMAL HIGH (ref 65–99)
Glucose-Capillary: 153 mg/dL — ABNORMAL HIGH (ref 65–99)
Glucose-Capillary: 159 mg/dL — ABNORMAL HIGH (ref 65–99)
Glucose-Capillary: 158 mg/dL — ABNORMAL HIGH (ref 65–99)

## 2015-11-07 LAB — BASIC METABOLIC PANEL
Anion gap: 6 (ref 5–15)
BUN: 15 mg/dL (ref 6–20)
CO2: 41 mmol/L — ABNORMAL HIGH (ref 22–32)
Calcium: 8.5 mg/dL — ABNORMAL LOW (ref 8.9–10.3)
Chloride: 92 mmol/L — ABNORMAL LOW (ref 101–111)
Creatinine, Ser: 0.59 mg/dL (ref 0.44–1.00)
GFR calc Af Amer: >60 mL/min (ref >60)
GFR calc non Af Amer: >60 mL/min (ref >60)
Glucose, Bld: 143 mg/dL — ABNORMAL HIGH (ref 65–99)
Potassium: 3.3 mmol/L — ABNORMAL LOW (ref 3.5–5.1)
Sodium: 139 mmol/L (ref 135–145)

## 2015-11-07 LAB — MAGNESIUM: Magnesium: 2 mg/dL (ref 1.7–2.4)

## 2015-11-07 MED ORDER — CETYLPYRIDINIUM CHLORIDE 0.05 % MT LIQD
7.0000 mL | Freq: Two times a day (BID) | OROMUCOSAL | Status: DC
  Administered 2015-11-07 – 2015-11-09 (×2): 7 mL via OROMUCOSAL

## 2015-11-07 MED ORDER — CHLORHEXIDINE GLUCONATE 0.12 % MT SOLN
15.0000 mL | Freq: Two times a day (BID) | OROMUCOSAL | Status: DC
  Administered 2015-11-07 – 2015-11-09 (×4): 15 mL via OROMUCOSAL
  Filled 2015-11-07 (×4): qty 15

## 2015-11-07 MED ORDER — POTASSIUM CHLORIDE CRYS ER 20 MEQ PO TBCR
40.0000 meq | EXTENDED_RELEASE_TABLET | Freq: Two times a day (BID) | ORAL | Status: AC
  Administered 2015-11-07 (×2): 40 meq via ORAL
  Filled 2015-11-07 (×2): qty 2

## 2015-11-07 NOTE — Progress Notes (Signed)
Patient able to ambulate a short distance  in room with standby assist x 2. Patient became winded and need to sit down after approximately 10-15 ft. Patient on 3 Liters oxygen via nasal cannula.  Oxygen level dropped to 84%, but quickly recovered after sitting down.  Patient assisted back to bed call bell within reach. Will continue to monitor.

## 2015-11-07 NOTE — Progress Notes (Signed)
[facility]                                                                              Patient Demographics  [name], is a 65 y.o. female, DOB - [birth year], IHK:[id number]  Admit date - 11/03/2015   Admitting Physician Edwin Dada, MD  Outpatient Primary MD for the patient is [name], MD  LOS - 2   Chief Complaint  Patient presents with  . Chest Pain      Interim history 65 year old with history of metastatic breast cancer, left upper extremity DVT, diabetes, hypertension presented with left arm pain and swelling. Patient was noted to be somnolent on admission after she had received morphine. Patient was found to be hemodynamically stable upon admission. Chest x-ray with difficult to interpret, CT angiogram was negative for PE dissection or pneumonia.  Patient main treated for acute hypercapnic respiratory failure as well as sepsis.  Assessment & Plan   Acute hypercapnic respiratory failure -Patient is morbidly obese elderly she is pickwickian with apnea/hypoventilation syndrome. -She developed sleepiness yesterday, ABG showed PCO2 of 90 and mild acidosis -Ttransferred to stepdown and placed on BiPAP.  Patient feeling better today and currently on [place] (3L- home amount) -Patient likely needs BiPAP vs CPAP at night  Sepsis secondary to UTI vs URI -Temperature of 102, respiratory distress and presence of UTI. -This is likely was present on admission without significant fever. -Initially treated with IV fluids and poor spectrum antibiotics, currently on aztreonam and vancomycin. -CT chest show no consolidation -Repeat CXR 3/22: low volumes with bibasilar atelectasis -Presented with urinalysis consistent with UTI. -Urine culture: >100K  -Started on aztreonam, given penicillin allergies.  -Blood culture negative to date  Left arm pain, swelling -LUE Dopplers negative for DVT. -Per patient has been there -So far unclear etiology, CT scan of the  left shoulder showed no evidence of acute abnormalities.  MO/OSA/OHS:  -Observed by the admitting physician to have desaturations while sleeping.  -She is morbidly obese, she is likely pickwickian and does have sleep apnea. -Needed BiPAP -Will continue to monitor closely  Metastatic Breast cancer -Initially diagnosed incidentally in 2014 in the context of her LUE arm pain and UE DVT.  -Has been undergoing treatment with letrozole, trastuzumab and pertuzumab lately, follwed by Dr. Jana Hakim. -Continue letrozole  IDDM -Continue home Lantus, ISS, CBG monitoring  HTN -Hold amlodipine and ARB/HCTZ until hemodynamics clearer  Code Status: Full  Family Communication: None at bedside  Disposition Plan: Admitted. Continue to monitor in stepdown  Time Spent in minutes   30 minutes  Procedures  None  Consults   None  DVT Prophylaxis  Lovenox  Lab Results  Component Value Date   PLT 192 11/06/2015    Medications  Scheduled Meds: . antiseptic oral rinse  7 mL Mouth Rinse q12n4p  . aztreonam  1 g Intravenous 3 times per day  . chlorhexidine  15 mL Mouth Rinse BID  . Chlorhexidine Gluconate Cloth  6 each Topical Q0600  . enoxaparin (LOVENOX) injection  40 mg Subcutaneous Q24H  . insulin aspart  0-15 Units Subcutaneous TID WC  . insulin aspart  0-5 Units Subcutaneous QHS  . insulin glargine  28 Units Subcutaneous Q2200  . letrozole  2.5 mg Oral Daily  . mupirocin ointment  1 application Nasal BID  . oxyCODONE  10 mg Oral Q12H  . potassium chloride  40 mEq Oral BID  . vancomycin  1,250 mg Intravenous Q12H   Continuous Infusions:  PRN Meds:.ipratropium-albuterol, morphine injection, ondansetron **OR** ondansetron (ZOFRAN) IV, oxyCODONE, promethazine, sodium chloride flush  Antibiotics    Anti-infectives    Start     Dose/Rate Route Frequency Ordered Stop   11/06/15 0400  vancomycin (VANCOCIN) 1,250 mg in sodium chloride 0.9 % 250 mL IVPB     1,250 mg 166.7 mL/hr  over 90 Minutes Intravenous Every 12 hours 11/05/15 1420     11/05/15 1500  vancomycin (VANCOCIN) 2,500 mg in sodium chloride 0.9 % 500 mL IVPB     2,500 mg 250 mL/hr over 120 Minutes Intravenous  Once 11/05/15 1420 11/05/15 1732   11/04/15 1800  aztreonam (AZACTAM) 1 g in dextrose 5 % 50 mL IVPB     1 g 100 mL/hr over 30 Minutes Intravenous 3 times per day 11/04/15 1725     11/04/15 0015  vancomycin (VANCOCIN) IVPB 1000 mg/200 mL premix  Status:  Discontinued     1,000 mg 200 mL/hr over 60 Minutes Intravenous  Once 11/04/15 0008 11/04/15 0519   11/04/15 0000  aztreonam (AZACTAM) 2 g in dextrose 5 % 50 mL IVPB     2 g 100 mL/hr over 30 Minutes Intravenous  Once 11/03/15 2358 11/04/15 0135      Subjective:   [name] seen and examined today.  Patient states her breathing has improved mildly.  She wears oxygen at home.  Tells me that her left arm always seems to be swollen.  Denies chest pain, abdominal pain, dizziness, headache.   Objective:   Filed Vitals:   11/07/15 0409 11/07/15 0500 11/07/15 0600 11/07/15 0800  BP:  133/64 120/61   Pulse:  56 55   Temp: 99.1 F (37.3 C)   97.5 F (36.4 C)  TempSrc: Axillary     Resp:  12 14   Height:      Weight: 141 kg (310 lb 13.6 oz)     SpO2: 95% 95% 91%     Wt Readings from Last 3 Encounters:  11/07/15 141 kg (310 lb 13.6 oz)  10/02/15 140.797 kg (310 lb 6.4 oz)  07/17/15 141.794 kg (312 lb 9.6 oz)     Intake/Output Summary (Last 24 hours) at 11/07/15 1112 Last data filed at 11/07/15 0600  Gross per 24 hour  Intake   1000 ml  Output   2050 ml  Net  -1050 ml    Exam  General: Well developed, well nourished, NAD  HEENT: NCAT,mucous membranes moist.   Cardiovascular: S1 S2 auscultated, no rubs, murmurs or gallops. Regular rate and rhythm.  Respiratory: Clear to auscultation, mild inspiratory wheezing   Abdomen: Soft, nontender, nondistended, + bowel sounds  Extremities: warm dry without cyanosis clubbing or  edema  Neuro: AAOx3, nonfocal  Psych: Normal affect and demeanor   Data Review   Micro Results Recent Results (from the past 240 hour(s))  Culture, Urine     Status: None   Collection Time: 11/03/15 11:32 PM  Result Value Ref Range Status   Specimen Description URINE, RANDOM  Final   Special Requests NONE  Final   Culture   Final    >=100,000 COLONIES/mL KLEBSIELLA PNEUMONIAE Performed at [facility]    Report Status  11/06/2015 FINAL  Final   Organism ID, Bacteria KLEBSIELLA PNEUMONIAE  Final      Susceptibility   Klebsiella pneumoniae - MIC*    AMPICILLIN >=32 RESISTANT Resistant     CEFAZOLIN <=4 SENSITIVE Sensitive     CEFTRIAXONE <=1 SENSITIVE Sensitive     CIPROFLOXACIN <=0.25 SENSITIVE Sensitive     GENTAMICIN <=1 SENSITIVE Sensitive     IMIPENEM <=0.25 SENSITIVE Sensitive     NITROFURANTOIN 32 SENSITIVE Sensitive     TRIMETH/SULFA <=20 SENSITIVE Sensitive     AMPICILLIN/SULBACTAM 4 SENSITIVE Sensitive     PIP/TAZO <=4 SENSITIVE Sensitive     * >=100,000 COLONIES/mL KLEBSIELLA PNEUMONIAE  Culture, blood (Routine X 2) w Reflex to ID Panel     Status: None (Preliminary result)   Collection Time: 11/05/15  4:17 PM  Result Value Ref Range Status   Specimen Description BLOOD LEFT HAND  Final   Special Requests IN PEDIATRIC BOTTLE 1CC  Final   Culture   Final    NO GROWTH < 24 HOURS Performed at [facility]    Report Status PENDING  Incomplete  Culture, blood (Routine X 2) w Reflex to ID Panel     Status: None (Preliminary result)   Collection Time: 11/05/15  4:17 PM  Result Value Ref Range Status   Specimen Description BLOOD RIGHT ARM  Final   Special Requests BOTTLES DRAWN AEROBIC ONLY 10CC  Final   Culture   Final    NO GROWTH < 24 HOURS Performed at [facility]    Report Status PENDING  Incomplete  MRSA PCR Screening     Status: Abnormal   Collection Time: 11/05/15  7:14 PM  Result Value Ref Range Status   MRSA by PCR POSITIVE (A)  NEGATIVE Final    Comment:        The GeneXpert MRSA Assay (FDA approved for NASAL specimens only), is one component of a comprehensive MRSA colonization surveillance program. It is not intended to diagnose MRSA infection nor to guide or monitor treatment for MRSA infections. RESULT CALLED TO, READ BACK BY AND VERIFIED WITH: B DAVIS RN @ [phone] ON 11/06/15 BY CDAVIS   C difficile quick scan w PCR reflex     Status: None   Collection Time: 11/06/15  6:16 PM  Result Value Ref Range Status   C Diff antigen NEGATIVE NEGATIVE Final   C Diff toxin NEGATIVE NEGATIVE Final   C Diff interpretation Negative for toxigenic C. difficile  Final    Radiology Reports Dg Chest 2 View  11/07/2015  CLINICAL DATA:  Cough, fever, htn, hx of breast ca EXAM: CHEST - 2 VIEW COMPARISON:  11/03/2015 FINDINGS: Relatively low volumes with patchy atelectasis in the lung bases, increased since previous exam. Heart size within normal limits for technique. Stable right IJ port catheter. No pneumothorax. No effusion. Spurring in the lower thoracic spine. IMPRESSION: Low volumes with bibasilar atelectasis. Electronically Signed   By: Lucrezia Europe M.D.   On: 11/07/2015 10:59   Dg Chest 2 View  11/03/2015  CLINICAL DATA:  Acute onset of central chest pain, cough and left arm swelling. Shortness of breath, lightheadedness, generalized weakness, nausea and vomiting. Initial encounter. EXAM: CHEST  2 VIEW COMPARISON:  Chest radiograph performed 09/28/2014, and CT of the chest performed 07/05/2015 FINDINGS: The lungs are well-aerated. Mild bibasilar opacities may reflect atelectasis or possibly mild pneumonia, depending on the patient's symptoms. Mild vascular congestion is seen. There is no evidence of pleural effusion  or pneumothorax. The heart is borderline normal in size. A right-sided chest port is noted ending about the distal SVC. No acute osseous abnormalities are seen. IMPRESSION: Mild bibasilar opacities may reflect  atelectasis or possibly mild pneumonia, depending on the patient's symptoms. Mild vascular congestion seen. Electronically Signed   By: Garald Balding M.D.   On: 11/03/2015 19:34   Ct Angio Chest Pe W/cm &/or Wo Cm  11/03/2015  CLINICAL DATA:  Central chest pain and cough, onset yesterday. EXAM: CT ANGIOGRAPHY CHEST WITH CONTRAST TECHNIQUE: Multidetector CT imaging of the chest was performed using the standard protocol during bolus administration of intravenous contrast. Multiplanar CT image reconstructions and MIPs were obtained to evaluate the vascular anatomy. CONTRAST:  170m OMNIPAQUE IOHEXOL 350 MG/ML SOLN COMPARISON:  07/05/2015 FINDINGS: Cardiovascular: There is good opacification of the pulmonary arteries. There is no pulmonary embolism. The thoracic aorta is normal in caliber and intact. Lungs: Minimal pleural thickening in the fissures, as well as mild unchanged curvilinear scarring. This is stable from 07/05/2015. Central airways: Patent Effusions: None Lymphadenopathy: Nonspecific nodes in the mediastinum and hila. No pathologic adenopathy. A few physiologic appearing nodes are visible in the axillary regions bilaterally. Esophagus: Unremarkable Upper abdomen: No significant abnormality Musculoskeletal: No significant abnormality mild degenerative thoracic disc disease. Review of the MIP images confirms the above findings. IMPRESSION: Negative for acute pulmonary embolism. Unchanged mild curvilinear scarring bilaterally. Electronically Signed   By: DAndreas NewportM.D.   On: 11/03/2015 23:53   Ct Shoulder Left W Contrast  11/05/2015  CLINICAL DATA:  Left shoulder pain and swelling. History of breast cancer. EXAM: CT OF THE LEFT SHOULDER WITH CONTRAST TECHNIQUE: Multidetector CT imaging was performed following the standard protocol during bolus administration of intravenous contrast. CONTRAST:  1025mOMNIPAQUE IOHEXOL 300 MG/ML  SOLN COMPARISON:  None. FINDINGS: No acute fracture or dislocation.  No lytic or sclerotic osseous lesion. No bone destruction. The muscles are normal. There is no muscle atrophy. There is no fluid collection or hematoma. There is no soft tissue mass. There is no left axillary lymphadenopathy. The visualized left lung is clear. IMPRESSION: 1. No acute osseous abnormality of the left shoulder. Electronically Signed   By: HeKathreen Devoid On: 11/05/2015 16:06    CBC  Recent Labs Lab 11/03/15 1930 11/04/15 0551 11/05/15 0412 11/06/15 0430  WBC 4.0 3.3* 3.4* 3.7*  HGB 14.9 12.8 12.9 12.9  HCT 44.2 40.5 42.8 42.3  PLT 242 214 219 192  MCV 84.0 86.5 91.1 90.8  MCH 28.3 27.4 27.4 27.7  MCHC 33.7 31.6 30.1 30.5  RDW 14.6 14.8 15.0 14.6    Chemistries   Recent Labs Lab 11/03/15 1930 11/04/15 0551 11/05/15 0412 11/06/15 0430 11/07/15 0328  NA 134*  --  138 141 139  K 4.1  --  3.9 3.4* 3.3*  CL 95*  --  97* 96* 92*  CO2 27  --  35* 39* 41*  GLUCOSE 289*  --  246* 161* 143*  BUN 17  --  '15 15 15  '$ CREATININE 0.93 0.92 0.66 0.65 0.59  CALCIUM 9.3  --  8.5* 8.4* 8.5*  MG  --   --   --   --  2.0  AST 154*  --   --   --   --   ALT 166*  --   --   --   --   ALKPHOS 167*  --   --   --   --   BILITOT  0.2*  --   --   --   --    ------------------------------------------------------------------------------------------------------------------ estimated creatinine clearance is 98.7 mL/min (by C-G formula based on Cr of 0.59). ------------------------------------------------------------------------------------------------------------------ No results for input(s): HGBA1C in the last 72 hours. ------------------------------------------------------------------------------------------------------------------ No results for input(s): CHOL, HDL, LDLCALC, TRIG, CHOLHDL, LDLDIRECT in the last 72 hours. ------------------------------------------------------------------------------------------------------------------ No results for input(s): TSH, T4TOTAL, T3FREE,  THYROIDAB in the last 72 hours.  Invalid input(s): FREET3 ------------------------------------------------------------------------------------------------------------------ No results for input(s): VITAMINB12, FOLATE, FERRITIN, TIBC, IRON, RETICCTPCT in the last 72 hours.  Coagulation profile  Recent Labs Lab 11/03/15 1930  INR 1.03    No results for input(s): DDIMER in the last 72 hours.  Cardiac Enzymes No results for input(s): CKMB, TROPONINI, MYOGLOBIN in the last 168 hours.  Invalid input(s): CK ------------------------------------------------------------------------------------------------------------------ Invalid input(s): POCBNP    [name] D.O. on 11/07/2015 at 11:12 AM  Between 7am to 7pm - Pager - [phone]  After 7pm go to [email or web address] - password [email or web address]  And look for the night coverage person covering for me after hours  [facility] Office  [phone]

## 2015-11-07 NOTE — Progress Notes (Signed)
Patient currently on [place] @ home setting of 3 L with O2 saturation of 95%. RT will continue to monitor patient.

## 2015-11-08 DIAGNOSIS — N390 Urinary tract infection, site not specified: Secondary | ICD-10-CM

## 2015-11-08 DIAGNOSIS — I5032 Chronic diastolic (congestive) heart failure: Secondary | ICD-10-CM

## 2015-11-08 DIAGNOSIS — J9602 Acute respiratory failure with hypercapnia: Secondary | ICD-10-CM

## 2015-11-08 LAB — BLOOD GAS, ARTERIAL
Acid-Base Excess: 11 mmol/L — ABNORMAL HIGH (ref 0.0–2.0)
Bicarbonate: 37.5 mEq/L — ABNORMAL HIGH (ref 20.0–24.0)
Drawn by: 276051
O2 Content: 3 L/min
O2 Saturation: 96.1 %
Patient temperature: 98.6
TCO2: 33.5 mmol/L (ref 0–100)
pCO2 arterial: 59.6 mmHg (ref 35.0–45.0)
pH, Arterial: 7.416 (ref 7.350–7.450)
pO2, Arterial: 78.7 mmHg — ABNORMAL LOW (ref 80.0–100.0)

## 2015-11-08 LAB — CBC
HCT: 38 % (ref 36.0–46.0)
Hemoglobin: 12.3 g/dL (ref 12.0–15.0)
MCH: 27.6 pg (ref 26.0–34.0)
MCHC: 32.4 g/dL (ref 30.0–36.0)
MCV: 85.4 fL (ref 78.0–100.0)
Platelets: 141 10*3/uL — ABNORMAL LOW (ref 150–400)
RBC: 4.45 MIL/uL (ref 3.87–5.11)
RDW: 13.8 % (ref 11.5–15.5)
WBC: 3.2 10*3/uL — ABNORMAL LOW (ref 4.0–10.5)

## 2015-11-08 LAB — BASIC METABOLIC PANEL
Anion gap: 7 (ref 5–15)
BUN: 12 mg/dL (ref 6–20)
CO2: 37 mmol/L — ABNORMAL HIGH (ref 22–32)
Calcium: 8.6 mg/dL — ABNORMAL LOW (ref 8.9–10.3)
Chloride: 96 mmol/L — ABNORMAL LOW (ref 101–111)
Creatinine, Ser: 0.52 mg/dL (ref 0.44–1.00)
GFR calc Af Amer: >60 mL/min (ref >60)
GFR calc non Af Amer: >60 mL/min (ref >60)
Glucose, Bld: 146 mg/dL — ABNORMAL HIGH (ref 65–99)
Potassium: 3.6 mmol/L (ref 3.5–5.1)
Sodium: 140 mmol/L (ref 135–145)

## 2015-11-08 LAB — GLUCOSE, CAPILLARY
Glucose-Capillary: 133 mg/dL — ABNORMAL HIGH (ref 65–99)
Glucose-Capillary: 144 mg/dL — ABNORMAL HIGH (ref 65–99)
Glucose-Capillary: 127 mg/dL — ABNORMAL HIGH (ref 65–99)
Glucose-Capillary: 156 mg/dL — ABNORMAL HIGH (ref 65–99)

## 2015-11-08 LAB — BRAIN NATRIURETIC PEPTIDE: B Natriuretic Peptide: 25.1 pg/mL (ref 0.0–100.0)

## 2015-11-08 MED ORDER — SULFAMETHOXAZOLE-TRIMETHOPRIM 800-160 MG PO TABS
1.0000 | ORAL_TABLET | Freq: Two times a day (BID) | ORAL | Status: DC
  Administered 2015-11-08 – 2015-11-09 (×3): 1 via ORAL
  Filled 2015-11-08 (×4): qty 1

## 2015-11-08 NOTE — Progress Notes (Signed)
Pharmacy Antibiotic Note  [name] is a 65 y.o. female admitted on 11/03/2015 with UTI and possible PNA.  Pharmacy has been consulted for vancomycin and aztreonam dosing. Although PNA appears less likely.  Respiratory distress resolved and back on home O2 requirements.   Today is Day #4 vancomycin and Day #5 aztreonam  Urine culture from 3/18 reveals K. Pneumoniae only resistant to amp.    Plan:  Based on cultures and no apparent PNA on CXR, consider narrow antibiotics to PO TMP/SMZ 1 DS BID based on allergy to PCNs.   If vancomycin to continue will need to get vancomycin trough within next 24h  Height: '5\' 4"'$  (162.6 cm) Weight: (!) 312 lb 6.3 oz (141.7 kg) IBW/kg (Calculated) : 54.7  Temp (24hrs), Avg:98.9 F (37.2 C), Min:98.1 F (36.7 C), Max:99.6 F (37.6 C)   Recent Labs Lab 11/03/15 1930 11/04/15 0551 11/05/15 0412 11/06/15 0430 11/07/15 0328 11/08/15 0542  WBC 4.0 3.3* 3.4* 3.7*  --  3.2*  CREATININE 0.93 0.92 0.66 0.65 0.59 0.52    Estimated Creatinine Clearance: 99.1 mL/min (by C-G formula based on Cr of 0.52).    Allergies  Allergen Reactions  . Meperidine Hcl Anaphylaxis  . Penicillins Anaphylaxis    Has patient had a PCN reaction causing immediate rash, facial/tongue/throat swelling, SOB or lightheadedness with hypotension: yes Has patient had a PCN reaction causing severe rash involving mucus membranes or skin necrosis: no Has patient had a PCN reaction that required hospitalization yes Has patient had a PCN reaction occurring within the last 10 years: no If all of the above answers are "NO", then may proceed with Cephalosporin use.   Marland Kitchen Amoxicillin     REACTION: unspecified  . Aspirin Nausea And Vomiting    REACTION: unspecified  . Percocet [Oxycodone-Acetaminophen]     Antimicrobials this admission:  Azactam 3/19 >>  Vancomycin 3/20 >>   Dose adjustments this admission:  -  Microbiology results:  3/18 UCx: K. Pneumoniae (R only to amp)   3/20 MRSA +  3/20 Bldx2: NGTD  3/21 CDiff Ag/Toxin negative  Thank you for allowing pharmacy to be a part of this patient's care.  Doreene Eland, PharmD, BCPS.   Pager: [phone] 11/08/2015 9:15 AM

## 2015-11-08 NOTE — Progress Notes (Signed)
Pt refused to BiPAP qhs.  Pt stated that she could not tolerate wearing the mask tonight.  Education provided to the Pt but still refused.

## 2015-11-08 NOTE — Progress Notes (Addendum)
[facility]                                                                              Patient Demographics  [name], is a 65 y.o. female, DOB - 1951-05-27, SWN:[id number]  Admit date - 11/03/2015   Admitting Physician Edwin Dada, MD  Outpatient Primary MD for the patient is [name], MD  LOS - 3   Chief Complaint  Patient presents with  . Chest Pain      Interim history 65 year old with history of metastatic breast cancer, left upper extremity DVT, diabetes, hypertension presented with left arm pain and swelling. Patient was noted to be somnolent on admission after she had received morphine. Patient was found to be hemodynamically stable upon admission. Chest x-ray with difficult to interpret, CT angiogram was negative for PE dissection or pneumonia.  Patient main treated for acute hypercapnic respiratory failure as well as sepsis.  On 3/21, patient became more somnolent. ABG noted PCO2 of 90 with mild respiratory acidosis the patient transferred to step down and started on BiPAP. Over the next 24 hours, she improved breathing more comfortably.  Today, 3/23, patient feeling better. She is fully awake and alert. She does not like to wear the CPAP because she says it hurts her nose.  Assessment & Plan   Acute hypercapnic respiratory failure secondary to obstructive sleep apnea, hypoventilation syndrome and morbid obesity. Check ABG for follow-up. Likely will need nightly CPAP. We'll need to make sure she is compliant home.   Diastolic heart failure chronic: Checking BNP. Previous echo several years ago noted chronic diastolic heart failure grade 1.    Sepsis secondary to  Klebsiella UTI  - patient met criteria on admission given tachycardia, fever Temperature of 102, respiratory distress and presence of UTI.  Patient also had upper respiratory complaints given concerns for pneumonia. In the setting of sepsis either way, treated with aztreonam and  vancomycin. Urine cultures positive for near pansensitive Klebsiella. Given CT scanning chest and repeat chest x-ray notes no evidence of pneumonia, we'll change IV antibiotics to Bactrim DS by mouth twice a day as per pharmacy recommendations.   Left arm pain, swelling -LUE Dopplers negative for DVT. -Per patient has been there -So far unclear etiology, CT scan of the left shoulder showed no evidence of acute abnormalities.  MO/OSA/OHS:  Patient meets criteria for morbid obesity given BMI greater than 40 -Observed by the admitting physician to have desaturations while sleeping.  -She is morbidly obese, she is likely pickwickian and does have sleep apnea. -Needed BiPAP -Will continue to monitor closely  Metastatic Breast cancer -Initially diagnosed incidentally in 2014 in the context of her LUE arm pain and UE DVT.  -Has been undergoing treatment with letrozole, trastuzumab and pertuzumab lately, follwed by Dr. Jana Hakim. -Continue letrozole  IDDM -Continue home Lantus, ISS, CBGs under 200 over last   48 hours   HTN -Hold amlodipine and ARB/HCTZ until hemodynamics clearer  Code Status: Full  Family Communication: None at bedside  Disposition Plan: Transfer out of stepdown. Physical therapy to see. Anticipate discharge tomorrow  Time Spent in minutes  25 minutes   Procedures  None  Consults  None  DVT Prophylaxis  Lovenox  Lab Results  Component Value Date   PLT 141* 11/08/2015    Medications  Scheduled Meds: . antiseptic oral rinse  7 mL Mouth Rinse q12n4p  . chlorhexidine  15 mL Mouth Rinse BID  . Chlorhexidine Gluconate Cloth  6 each Topical Q0600  . enoxaparin (LOVENOX) injection  40 mg Subcutaneous Q24H  . insulin aspart  0-15 Units Subcutaneous TID WC  . insulin aspart  0-5 Units Subcutaneous QHS  . insulin glargine  28 Units Subcutaneous Q2200  . letrozole  2.5 mg Oral Daily  . mupirocin ointment  1 application Nasal BID  . oxyCODONE  10 mg Oral  Q12H  . sulfamethoxazole-trimethoprim  1 tablet Oral Q12H   Continuous Infusions:  PRN Meds:.ipratropium-albuterol, morphine injection, ondansetron **OR** ondansetron (ZOFRAN) IV, oxyCODONE, promethazine, sodium chloride flush  Antibiotics    Anti-infectives    Start     Dose/Rate Route Frequency Ordered Stop   11/08/15 1200  sulfamethoxazole-trimethoprim (BACTRIM DS,SEPTRA DS) 800-160 MG per tablet 1 tablet     1 tablet Oral Every 12 hours 11/08/15 1034     11/06/15 0400  vancomycin (VANCOCIN) 1,250 mg in sodium chloride 0.9 % 250 mL IVPB  Status:  Discontinued     1,250 mg 166.7 mL/hr over 90 Minutes Intravenous Every 12 hours 11/05/15 1420 11/08/15 1034   11/05/15 1500  vancomycin (VANCOCIN) 2,500 mg in sodium chloride 0.9 % 500 mL IVPB     2,500 mg 250 mL/hr over 120 Minutes Intravenous  Once 11/05/15 1420 11/05/15 1732   11/04/15 1800  aztreonam (AZACTAM) 1 g in dextrose 5 % 50 mL IVPB  Status:  Discontinued     1 g 100 mL/hr over 30 Minutes Intravenous 3 times per day 11/04/15 1725 11/08/15 1034   11/04/15 0015  vancomycin (VANCOCIN) IVPB 1000 mg/200 mL premix  Status:  Discontinued     1,000 mg 200 mL/hr over 60 Minutes Intravenous  Once 11/04/15 0008 11/04/15 0519   11/04/15 0000  aztreonam (AZACTAM) 2 g in dextrose 5 % 50 mL IVPB     2 g 100 mL/hr over 30 Minutes Intravenous  Once 11/03/15 2358 11/04/15 0135      Subjective:   Tennis Ship seen and examined today.  Patient states she's feeling much better. Feels that breathing a little bit easier. Denies any pain other than her chronic lower back pain.  Objective:   Filed Vitals:   11/08/15 0400 11/08/15 0600 11/08/15 0700 11/08/15 0800  BP:  130/54  143/72  Pulse:  55  60  Temp: 99 F (37.2 C)  98.8 F (37.1 C)   TempSrc: Axillary     Resp:  14  20  Height:      Weight: 141.7 kg (312 lb 6.3 oz)     SpO2:  95%  95%    Wt Readings from Last 3 Encounters:  11/08/15 141.7 kg (312 lb 6.3 oz)  10/02/15 140.797 kg  (310 lb 6.4 oz)  07/17/15 141.794 kg (312 lb 9.6 oz)     Intake/Output Summary (Last 24 hours) at 11/08/15 1116 Last data filed at 11/08/15 0834  Gross per 24 hour  Intake   1020 ml  Output    550 ml  Net    470 ml    Exam  General: Well developed, well nourished, NAD  HEENT: NCAT,mucous membranes moist.   Cardiovascular: Regular rate and rhythm, S1-S2   Respiratory: Clear to auscultation bilaterally, although decreased breath  sounds throughout secondary to body habitus   Abdomen: Soft, nontender, nondistended, + bowel sounds  Extremities: warm dry without cyanosis clubbing or edema  Neuro:No focal findings    Data Review   Micro Results Recent Results (from the past 240 hour(s))  Culture, Urine     Status: None   Collection Time: 11/03/15 11:32 PM  Result Value Ref Range Status   Specimen Description URINE, RANDOM  Final   Special Requests NONE  Final   Culture   Final    >=100,000 COLONIES/mL KLEBSIELLA PNEUMONIAE Performed at [facility]    Report Status 11/06/2015 FINAL  Final   Organism ID, Bacteria KLEBSIELLA PNEUMONIAE  Final      Susceptibility   Klebsiella pneumoniae - MIC*    AMPICILLIN >=32 RESISTANT Resistant     CEFAZOLIN <=4 SENSITIVE Sensitive     CEFTRIAXONE <=1 SENSITIVE Sensitive     CIPROFLOXACIN <=0.25 SENSITIVE Sensitive     GENTAMICIN <=1 SENSITIVE Sensitive     IMIPENEM <=0.25 SENSITIVE Sensitive     NITROFURANTOIN 32 SENSITIVE Sensitive     TRIMETH/SULFA <=20 SENSITIVE Sensitive     AMPICILLIN/SULBACTAM 4 SENSITIVE Sensitive     PIP/TAZO <=4 SENSITIVE Sensitive     * >=100,000 COLONIES/mL KLEBSIELLA PNEUMONIAE  Culture, blood (Routine X 2) w Reflex to ID Panel     Status: None (Preliminary result)   Collection Time: 11/05/15  4:17 PM  Result Value Ref Range Status   Specimen Description BLOOD LEFT HAND  Final   Special Requests IN PEDIATRIC BOTTLE 1CC  Final   Culture   Final    NO GROWTH 2 DAYS Performed at [facility]    Report Status PENDING  Incomplete  Culture, blood (Routine X 2) w Reflex to ID Panel     Status: None (Preliminary result)   Collection Time: 11/05/15  4:17 PM  Result Value Ref Range Status   Specimen Description BLOOD RIGHT ARM  Final   Special Requests BOTTLES DRAWN AEROBIC ONLY 10CC  Final   Culture   Final    NO GROWTH 2 DAYS Performed at [facility]    Report Status PENDING  Incomplete  MRSA PCR Screening     Status: Abnormal   Collection Time: 11/05/15  7:14 PM  Result Value Ref Range Status   MRSA by PCR POSITIVE (A) NEGATIVE Final    Comment:        The GeneXpert MRSA Assay (FDA approved for NASAL specimens only), is one component of a comprehensive MRSA colonization surveillance program. It is not intended to diagnose MRSA infection nor to guide or monitor treatment for MRSA infections. RESULT CALLED TO, READ BACK BY AND VERIFIED WITH: B DAVIS RN @ [phone] ON 11/06/15 BY CDAVIS   C difficile quick scan w PCR reflex     Status: None   Collection Time: 11/06/15  6:16 PM  Result Value Ref Range Status   C Diff antigen NEGATIVE NEGATIVE Final   C Diff toxin NEGATIVE NEGATIVE Final   C Diff interpretation Negative for toxigenic C. difficile  Final    Radiology Reports Dg Chest 2 View  11/07/2015  CLINICAL DATA:  Cough, fever, htn, hx of breast ca EXAM: CHEST - 2 VIEW COMPARISON:  11/03/2015 FINDINGS: Relatively low volumes with patchy atelectasis in the lung bases, increased since previous exam. Heart size within normal limits for technique. Stable right IJ port catheter. No pneumothorax. No effusion. Spurring in the lower thoracic spine. IMPRESSION: Low  volumes with bibasilar atelectasis. Electronically Signed   By: Lucrezia Europe M.D.   On: 11/07/2015 10:59   Dg Chest 2 View  11/03/2015  CLINICAL DATA:  Acute onset of central chest pain, cough and left arm swelling. Shortness of breath, lightheadedness, generalized weakness, nausea and vomiting. Initial  encounter. EXAM: CHEST  2 VIEW COMPARISON:  Chest radiograph performed 09/28/2014, and CT of the chest performed 07/05/2015 FINDINGS: The lungs are well-aerated. Mild bibasilar opacities may reflect atelectasis or possibly mild pneumonia, depending on the patient's symptoms. Mild vascular congestion is seen. There is no evidence of pleural effusion or pneumothorax. The heart is borderline normal in size. A right-sided chest port is noted ending about the distal SVC. No acute osseous abnormalities are seen. IMPRESSION: Mild bibasilar opacities may reflect atelectasis or possibly mild pneumonia, depending on the patient's symptoms. Mild vascular congestion seen. Electronically Signed   By: Garald Balding M.D.   On: 11/03/2015 19:34   Ct Angio Chest Pe W/cm &/or Wo Cm  11/03/2015  CLINICAL DATA:  Central chest pain and cough, onset yesterday. EXAM: CT ANGIOGRAPHY CHEST WITH CONTRAST TECHNIQUE: Multidetector CT imaging of the chest was performed using the standard protocol during bolus administration of intravenous contrast. Multiplanar CT image reconstructions and MIPs were obtained to evaluate the vascular anatomy. CONTRAST:  171m OMNIPAQUE IOHEXOL 350 MG/ML SOLN COMPARISON:  07/05/2015 FINDINGS: Cardiovascular: There is good opacification of the pulmonary arteries. There is no pulmonary embolism. The thoracic aorta is normal in caliber and intact. Lungs: Minimal pleural thickening in the fissures, as well as mild unchanged curvilinear scarring. This is stable from 07/05/2015. Central airways: Patent Effusions: None Lymphadenopathy: Nonspecific nodes in the mediastinum and hila. No pathologic adenopathy. A few physiologic appearing nodes are visible in the axillary regions bilaterally. Esophagus: Unremarkable Upper abdomen: No significant abnormality Musculoskeletal: No significant abnormality mild degenerative thoracic disc disease. Review of the MIP images confirms the above findings. IMPRESSION: Negative for  acute pulmonary embolism. Unchanged mild curvilinear scarring bilaterally. Electronically Signed   By: DAndreas NewportM.D.   On: 11/03/2015 23:53   Ct Shoulder Left W Contrast  11/05/2015  CLINICAL DATA:  Left shoulder pain and swelling. History of breast cancer. EXAM: CT OF THE LEFT SHOULDER WITH CONTRAST TECHNIQUE: Multidetector CT imaging was performed following the standard protocol during bolus administration of intravenous contrast. CONTRAST:  1055mOMNIPAQUE IOHEXOL 300 MG/ML  SOLN COMPARISON:  None. FINDINGS: No acute fracture or dislocation. No lytic or sclerotic osseous lesion. No bone destruction. The muscles are normal. There is no muscle atrophy. There is no fluid collection or hematoma. There is no soft tissue mass. There is no left axillary lymphadenopathy. The visualized left lung is clear. IMPRESSION: 1. No acute osseous abnormality of the left shoulder. Electronically Signed   By: HeKathreen Devoid On: 11/05/2015 16:06    CBC  Recent Labs Lab 11/03/15 1930 11/04/15 0551 11/05/15 0412 11/06/15 0430 11/08/15 0542  WBC 4.0 3.3* 3.4* 3.7* 3.2*  HGB 14.9 12.8 12.9 12.9 12.3  HCT 44.2 40.5 42.8 42.3 38.0  PLT 242 214 219 192 141*  MCV 84.0 86.5 91.1 90.8 85.4  MCH 28.3 27.4 27.4 27.7 27.6  MCHC 33.7 31.6 30.1 30.5 32.4  RDW 14.6 14.8 15.0 14.6 13.8    Chemistries   Recent Labs Lab 11/03/15 1930 11/04/15 0551 11/05/15 0412 11/06/15 0430 11/07/15 0328 11/08/15 0542  NA 134*  --  138 141 139 140  K 4.1  --  3.9 3.4*  3.3* 3.6  CL 95*  --  97* 96* 92* 96*  CO2 27  --  35* 39* 41* 37*  GLUCOSE 289*  --  246* 161* 143* 146*  BUN 17  --  _0 CREATININE 0.93 0.92 0.66 0.65 0.59 0.52  CALCIUM 9.3  --  8.5* 8.4* 8.5* 8.6*  MG  --   --   --   --  2.0  --   AST 154*  --   --   --   --   --   ALT 166*  --   --   --   --   --   ALKPHOS 167*  --   --   --   --   --   BILITOT 0.2*  --   --   --   --   --     ------------------------------------------------------------------------------------------------------------------ estimated creatinine clearance is 99.1 mL/min (by C-G formula based on Cr of 0.52). ------------------------------------------------------------------------------------------------------------------ No results for input(s): HGBA1C in the last 72 hours. ------------------------------------------------------------------------------------------------------------------ No results for input(s): CHOL, HDL, LDLCALC, TRIG, CHOLHDL, LDLDIRECT in the last 72 hours. ------------------------------------------------------------------------------------------------------------------ No results for input(s): TSH, T4TOTAL, T3FREE, THYROIDAB in the last 72 hours.  Invalid input(s): FREET3 ------------------------------------------------------------------------------------------------------------------ No results for input(s): VITAMINB12, FOLATE, FERRITIN, TIBC, IRON, RETICCTPCT in the last 72 hours.  Coagulation profile  Recent Labs Lab 11/03/15 1930  INR 1.03     Annita Brod MD on 11/08/2015 at 11:16 AM [email or web address] - password [email or web address]  [phone]

## 2015-11-08 NOTE — Progress Notes (Signed)
Date:  November 09, 2015 Chart reviewed for concurrent status and case management needs. Will continue to follow patient for changes and needs: Velva Harman, BSN, [place], [place]   [phone]

## 2015-11-09 LAB — BASIC METABOLIC PANEL
Anion gap: 9 (ref 5–15)
BUN: 8 mg/dL (ref 6–20)
CO2: 36 mmol/L — ABNORMAL HIGH (ref 22–32)
Calcium: 8.8 mg/dL — ABNORMAL LOW (ref 8.9–10.3)
Chloride: 98 mmol/L — ABNORMAL LOW (ref 101–111)
Creatinine, Ser: 0.55 mg/dL (ref 0.44–1.00)
GFR calc Af Amer: >60 mL/min (ref >60)
GFR calc non Af Amer: >60 mL/min (ref >60)
Glucose, Bld: 135 mg/dL — ABNORMAL HIGH (ref 65–99)
Potassium: 3.4 mmol/L — ABNORMAL LOW (ref 3.5–5.1)
Sodium: 143 mmol/L (ref 135–145)

## 2015-11-09 LAB — GLUCOSE, CAPILLARY
Glucose-Capillary: 161 mg/dL — ABNORMAL HIGH (ref 65–99)
Glucose-Capillary: 164 mg/dL — ABNORMAL HIGH (ref 65–99)

## 2015-11-09 MED ORDER — HEPARIN SOD (PORK) LOCK FLUSH 100 UNIT/ML IV SOLN
500.0000 [IU] | INTRAVENOUS | Status: AC | PRN
  Administered 2015-11-09: 500 [IU]

## 2015-11-09 MED ORDER — POTASSIUM CHLORIDE CRYS ER 20 MEQ PO TBCR
40.0000 meq | EXTENDED_RELEASE_TABLET | Freq: Once | ORAL | Status: AC
  Administered 2015-11-09: 40 meq via ORAL
  Filled 2015-11-09: qty 2

## 2015-11-09 MED ORDER — MUPIROCIN 2 % EX OINT: 1.0000 "application " | TOPICAL_OINTMENT | Freq: Two times a day (BID) | CUTANEOUS | Status: DC

## 2015-11-09 MED ORDER — SULFAMETHOXAZOLE-TRIMETHOPRIM 800-160 MG PO TABS: 1.0000 | ORAL_TABLET | Freq: Two times a day (BID) | ORAL | Status: DC

## 2015-11-09 NOTE — Discharge Summary (Signed)
Physician Discharge Summary  [name] ZDG:[id number] DOB: [birth year] DOA: 11/03/2015  PCP: [name], MD  Admit date: 11/03/2015 Discharge date: 11/09/2015  Time spent: 45 minutes  Recommendations for Outpatient Follow-up:  Patient will be discharged to home with CPAP.  Patient will need to follow up with primary care provider within one week of discharge.  Outpatient referral for sleep study. Patient should continue medications as prescribed.  Patient should follow a heart healthy/ carb modified diet.   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 Myrene Buddy [name] is a 66 y.o. female with a past medical history significant for metastatic breast Cancer, hx of LUE DVT, IDDM and HTN who presents with left arm swelling and pain.  History is collected from the patient's niece as well as the patient, because of patient's somnolence from morphine. The patient called her niece today because she was having new left arm pain, swelling, and back pain, similar to when she had her upper extremity DVT 2 years ago when her cancer was diagnosed. The pain was located "everywhere" in her left arm, vague in character and moderate to severe in intensity. There was no redness, but she felt it was swollen relative to the right.  In the ED, she had a low grade temperature and was hemodynamically stable. A chest x-ray was difficult to interpret but a CT  angiogram was negative for PE, dissection, or pneumonia. TRH were asked to admit to observation so that the patient could undergo a vascular study of her left arm for ruling out DVT.  There was initial concern by ER of both HCAP and UTI. The former appeared to have been ruled out by CT and clinical exam, and the latter appears to be asymptomatic bacteriuria.  Hospital Course:  Acute hypercapnic respiratory failure -Patient is morbidly obese elderly she is pickwickian with apnea/hypoventilation syndrome. -She developed sleepiness yesterday, ABG showed PCO2 of 90 and mild acidosis -Ttransferred to stepdown and placed on BiPAP. Patient feeling better today and currently on [place] (3L- home amount) -Patient likely needs BiPAP vs CPAP at night  Sepsis secondary to UTI vs URI -During hospitalization, Temperature of 102, respiratory distress and presence of UTI. -Initially treated with IV fluids and poor spectrum antibiotics, currently on aztreonam and vancomycin. Transitioned to Bactrim DS -CT chest show no consolidation -Repeat CXR 3/22: low volumes with bibasilar atelectasis -Presented with urinalysis consistent with UTI. -Urine culture: >100K Klebsiella -Blood culture negative to date  Chronic diastolic heart failure -BNP 25 -Echocardiogram 10/29/2015: EF 95-18%, grade 1 diastolic dysfunction  Left arm pain, swelling -LUE Dopplers negative for DVT. -Per patient has been there -So far unclear etiology, CT scan of the left shoulder showed no evidence of acute abnormalities.  MO/OSA/OHS:  -Observed by the admitting physician to have desaturations while sleeping.  -She is morbidly obese, she is likely pickwickian and does have sleep apnea. -Needed BiPAP.  Will arrange for CPAP  Metastatic Breast cancer -Initially diagnosed incidentally in 2014 in the context of her LUE arm pain and UE DVT.  -Has been undergoing treatment with letrozole, trastuzumab and pertuzumab lately, follwed by  Dr. Jana Hakim. -Continue letrozole  IDDM -Continue home Lantus, ISS, CBG monitoring  HTN -Held amlodipine and ARB/HCTZ, may restart upon discharge  Physical deconditioning -PT consulted, patient politely declined HHPT  Procedures: None  Consultations: None  Discharge Exam: Filed Vitals:   11/09/15 0610 11/09/15 1344  BP: 145/70 151/70  Pulse: 62 66  Temp: 97.3 F (36.3 C) 97.3 F (36.3 C)  Resp: 18 18     General: Well developed, well nourished, NAD  HEENT: NCAT, mucous membranes moist.  Cardiovascular: S1 S2 auscultated, RRR  Respiratory: Clear to auscultation   Abdomen: Soft, nontender, nondistended, + bowel sounds  Extremities: warm dry without cyanosis clubbing or edema  Neuro: AAOx3, nonfocal  Psych: Normal affect and demeanor   Discharge Instructions      Discharge Instructions    Ambulatory referral to Sleep Studies    Complete by:  As directed      Discharge instructions    Complete by:  As directed   Patient will be discharged to home with CPAP.  Patient will need to follow up with primary care provider within one week of discharge.  Outpatient referral for sleep study. Patient should continue medications as prescribed.  Patient should follow a heart healthy/ carb modified diet.            Medication List    STOP taking these medications        nitrofurantoin (macrocrystal-monohydrate) 100 MG capsule  Commonly known as:  MACROBID     traMADol 50 MG tablet  Commonly known as:  ULTRAM      TAKE these medications        amLODipine 5 MG tablet  Commonly known as:  NORVASC  TAKE 1 TABLET BY MOUTH EVERY DAY     aspirin 81 MG EC tablet  TAKE 1 TABLET BY MOUTH DAILY     glucose blood test strip  Use as instructed     insulin aspart 100 UNIT/ML FlexPen  Commonly known as:  NOVOLOG FLEXPEN  Inject 10 Units into the skin 3 (three) times daily with meals.     ipratropium-albuterol 0.5-2.5 (3) MG/3ML Soln  Commonly known as:  DUONEB    Take 3 mLs by nebulization every 6 (six) hours.     LANTUS SOLOSTAR 100 UNIT/ML Solostar Pen  Generic drug:  Insulin Glargine  INJECT 28 UNITS INTO THE SKIN DAILY AT 10 PM. PLEASE DISPENSE NEEDLES, #30 PER MONTH.     letrozole 2.5 MG tablet  Commonly known as:  FEMARA  TAKE 1 TABLET (2.5 MG TOTAL) BY MOUTH DAILY.     losartan-hydrochlorothiazide 100-12.5 MG tablet  Commonly known as:  HYZAAR  Take 1 tablet by mouth daily.     mupirocin ointment 2 %  Commonly known as:  BACTROBAN  Place 1 application into the nose 2 (two) times daily.     oxyCODONE 10 mg 12 hr tablet  Commonly known as:  OXYCONTIN  Take 1 tablet (10 mg total) by mouth every 12 (twelve) hours.     OXYGEN  Inhale into the lungs.     PROAIR HFA 108 (90 Base) MCG/ACT inhaler  Generic drug:  albuterol  INHALE 2 PUFFS INTO THE LUNGS EVERY 6 (SIX) HOURS AS NEEDED FOR WHEEZING.     sulfamethoxazole-trimethoprim 800-160 MG tablet  Commonly  known as:  BACTRIM DS,SEPTRA DS  Take 1 tablet by mouth every 12 (twelve) hours.       Allergies  Allergen Reactions  . Meperidine Hcl Anaphylaxis  . Penicillins Anaphylaxis    Has patient had a PCN reaction causing immediate rash, facial/tongue/throat swelling, SOB or lightheadedness with hypotension: yes Has patient had a PCN reaction causing severe rash involving mucus membranes or skin necrosis: no Has patient had a PCN reaction that required hospitalization yes Has patient had a PCN reaction occurring within the last 10 years: no If all of the above answers are "NO", then may proceed with Cephalosporin use.   Marland Kitchen Amoxicillin     REACTION: unspecified  . Aspirin Nausea And Vomiting    REACTION: unspecified  . Percocet [Oxycodone-Acetaminophen]    Follow-up Information    Follow up with CHAMBLISS,MARSHALL L, MD. Schedule an appointment as soon as possible for a visit in 1 week.   Specialty:  Family Medicine   Why:  Hospital follow up   Contact information:   [place] [address] [phone]       Go to [name], MD.   Specialty:  Family Medicine   Why:  your appointment is on 3/   Contact information:   [place] [address] [phone]       Follow up with Chauncey Cruel, MD. Schedule an appointment as soon as possible for a visit in 1 week.   Specialty:  Oncology   Contact information:   [place] [address] [phone]        The results of significant diagnostics from this hospitalization (including imaging, microbiology, ancillary and laboratory) are listed below for reference.    Significant Diagnostic Studies: Dg Chest 2 View  11/07/2015  CLINICAL DATA:  Cough, fever, htn, hx of breast ca EXAM: CHEST - 2 VIEW COMPARISON:  11/03/2015 FINDINGS: Relatively low volumes with patchy atelectasis in the lung bases, increased since previous exam. Heart size within normal limits for technique. Stable right IJ port catheter. No pneumothorax. No effusion. Spurring in the lower thoracic spine. IMPRESSION: Low volumes with bibasilar atelectasis. Electronically Signed   By: Lucrezia Europe M.D.   On: 11/07/2015 10:59   Dg Chest 2 View  11/03/2015  CLINICAL DATA:  Acute onset of central chest pain, cough and left arm swelling. Shortness of breath, lightheadedness, generalized weakness, nausea and vomiting. Initial encounter. EXAM: CHEST  2 VIEW COMPARISON:  Chest radiograph performed 09/28/2014, and CT of the chest performed 07/05/2015 FINDINGS: The lungs are well-aerated. Mild bibasilar opacities may reflect atelectasis or possibly mild pneumonia, depending on the patient's symptoms. Mild vascular congestion is seen. There is no evidence of pleural effusion or pneumothorax. The heart is borderline normal in size. A right-sided chest port is noted ending about the distal SVC. No acute osseous abnormalities are seen. IMPRESSION: Mild bibasilar opacities may reflect  atelectasis or possibly mild pneumonia, depending on the patient's symptoms. Mild vascular congestion seen. Electronically Signed   By: Garald Balding M.D.   On: 11/03/2015 19:34   Ct Angio Chest Pe W/cm &/or Wo Cm  11/03/2015  CLINICAL DATA:  Central chest pain and cough, onset yesterday. EXAM: CT ANGIOGRAPHY CHEST WITH CONTRAST TECHNIQUE: Multidetector CT imaging of the chest was performed using the standard protocol during bolus administration of intravenous contrast. Multiplanar CT image reconstructions and MIPs were obtained to evaluate the vascular anatomy. CONTRAST:  129m OMNIPAQUE IOHEXOL 350 MG/ML SOLN  COMPARISON:  07/05/2015 FINDINGS: Cardiovascular: There is good opacification of the pulmonary arteries. There is no pulmonary embolism. The thoracic aorta is normal in caliber and intact. Lungs: Minimal pleural thickening in the fissures, as well as mild unchanged curvilinear scarring. This is stable from 07/05/2015. Central airways: Patent Effusions: None Lymphadenopathy: Nonspecific nodes in the mediastinum and hila. No pathologic adenopathy. A few physiologic appearing nodes are visible in the axillary regions bilaterally. Esophagus: Unremarkable Upper abdomen: No significant abnormality Musculoskeletal: No significant abnormality mild degenerative thoracic disc disease. Review of the MIP images confirms the above findings. IMPRESSION: Negative for acute pulmonary embolism. Unchanged mild curvilinear scarring bilaterally. Electronically Signed   By: Andreas Newport M.D.   On: 11/03/2015 23:53   Ct Shoulder Left W Contrast  11/05/2015  CLINICAL DATA:  Left shoulder pain and swelling. History of breast cancer. EXAM: CT OF THE LEFT SHOULDER WITH CONTRAST TECHNIQUE: Multidetector CT imaging was performed following the standard protocol during bolus administration of intravenous contrast. CONTRAST:  138m OMNIPAQUE IOHEXOL 300 MG/ML  SOLN COMPARISON:  None. FINDINGS: No acute fracture or dislocation.  No lytic or sclerotic osseous lesion. No bone destruction. The muscles are normal. There is no muscle atrophy. There is no fluid collection or hematoma. There is no soft tissue mass. There is no left axillary lymphadenopathy. The visualized left lung is clear. IMPRESSION: 1. No acute osseous abnormality of the left shoulder. Electronically Signed   By: HKathreen Devoid  On: 11/05/2015 16:06    Microbiology: Recent Results (from the past 240 hour(s))  Culture, Urine     Status: None   Collection Time: 11/03/15 11:32 PM  Result Value Ref Range Status   Specimen Description URINE, RANDOM  Final   Special Requests NONE  Final   Culture   Final    >=100,000 COLONIES/mL KLEBSIELLA PNEUMONIAE [facility] at [facility]   Report Status 11/06/2015 FINAL  Final   Organism ID, Bacteria KLEBSIELLA PNEUMONIAE  Final      Susceptibility   Klebsiella pneumoniae - MIC*    AMPICILLIN >=32 RESISTANT Resistant     CEFAZOLIN <=4 SENSITIVE Sensitive     CEFTRIAXONE <=1 SENSITIVE Sensitive     CIPROFLOXACIN <=0.25 SENSITIVE Sensitive     GENTAMICIN <=1 SENSITIVE Sensitive     IMIPENEM <=0.25 SENSITIVE Sensitive     NITROFURANTOIN 32 SENSITIVE Sensitive     TRIMETH/SULFA <=20 SENSITIVE Sensitive     AMPICILLIN/SULBACTAM 4 SENSITIVE Sensitive     PIP/TAZO <=4 SENSITIVE Sensitive     * >=100,000 COLONIES/mL KLEBSIELLA PNEUMONIAE  Culture, blood (Routine X 2) w Reflex to ID Panel     Status: None (Preliminary result)   Collection Time: 11/05/15  4:17 PM  Result Value Ref Range Status   Specimen Description BLOOD LEFT HAND  Final   Special Requests IN PEDIATRIC BOTTLE 1CC  Final   Culture   Final    NO GROWTH 4 DAYS [facility]   Report Status PENDING  Incomplete  Culture, blood (Routine X 2) w Reflex to ID Panel     Status: None (Preliminary result)   Collection Time: 11/05/15  4:17 PM  Result Value Ref Range Status   Specimen Description BLOOD RIGHT ARM  Final   Special  Requests BOTTLES DRAWN AEROBIC ONLY 10CC  Final   Culture   Final    NO GROWTH 4 DAYS [facility]   Report Status PENDING  Incomplete  MRSA PCR  Screening     Status: Abnormal   Collection Time: 11/05/15  7:14 PM  Result Value Ref Range Status   MRSA by PCR POSITIVE (A) NEGATIVE Final    Comment:        The GeneXpert MRSA Assay (FDA approved for NASAL specimens only), is one component of a comprehensive MRSA colonization surveillance program. It is not intended to diagnose MRSA infection nor to guide or monitor treatment for MRSA infections. RESULT CALLED TO, READ BACK BY AND VERIFIED WITH: B DAVIS RN @ [phone] ON 11/06/15 BY CDAVIS   C difficile quick scan w PCR reflex     Status: None   Collection Time: 11/06/15  6:16 PM  Result Value Ref Range Status   C Diff antigen NEGATIVE NEGATIVE Final   C Diff toxin NEGATIVE NEGATIVE Final   C Diff interpretation Negative for toxigenic C. difficile  Final     Labs: Basic Metabolic Panel:  Recent Labs Lab 11/05/15 0412 11/06/15 0430 11/07/15 0328 11/08/15 0542 11/09/15 0415  NA 138 141 139 140 143  K 3.9 3.4* 3.3* 3.6 3.4*  CL 97* 96* 92* 96* 98*  CO2 35* 39* 41* 37* 36*  GLUCOSE 246* 161* 143* 146* 135*  BUN '15 15 15 12 8  '$ CREATININE 0.66 0.65 0.59 0.52 0.55  CALCIUM 8.5* 8.4* 8.5* 8.6* 8.8*  MG  --   --  2.0  --   --    Liver Function Tests:  Recent Labs Lab 11/03/15 1930  AST 154*  ALT 166*  ALKPHOS 167*  BILITOT 0.2*  PROT 7.8  ALBUMIN 3.7    Recent Labs Lab 11/03/15 1930  LIPASE 23   No results for input(s): AMMONIA in the last 168 hours. CBC:  Recent Labs Lab 11/03/15 1930 11/04/15 0551 11/05/15 0412 11/06/15 0430 11/08/15 0542  WBC 4.0 3.3* 3.4* 3.7* 3.2*  HGB 14.9 12.8 12.9 12.9 12.3  HCT 44.2 40.5 42.8 42.3 38.0  MCV 84.0 86.5 91.1 90.8 85.4  PLT 242 214 219 192 141*   Cardiac Enzymes: No results for input(s): CKTOTAL, CKMB, CKMBINDEX, TROPONINI in the last 168  hours. BNP: BNP (last 3 results)  Recent Labs  11/08/15 1050  BNP 25.1    ProBNP (last 3 results) No results for input(s): PROBNP in the last 8760 hours.  CBG:  Recent Labs Lab 11/08/15 1259 11/08/15 1723 11/08/15 2149 11/09/15 0739 11/09/15 1154  GLUCAP 144* 127* 156* 161* 164*       Signed:  Halley Kincer  [facility] 11/09/2015, 3:07 PM

## 2015-11-09 NOTE — Care Management Important Message (Addendum)
Important Message  Patient Details IM Letter given to Cookie/Case Manager to present to Patient  Name: [name] MRN: [id number] Date of Birth: 01-29-1951   Medicare Important Message Given:  Yes    Camillo Flaming 11/09/2015, 11:50 AMImportant Message  Patient Details  Name: [name] MRN: [id number] Date of Birth: 12-13-1950   Medicare Important Message Given:  Yes    Camillo Flaming 11/09/2015, 11:50 AM

## 2015-11-09 NOTE — Progress Notes (Signed)
DC instructions reviewed w/ pt and family member. Both verbalize understanding and all questions answered. Pt d/c in stable condition in w/c to family's car by NT. Pt in possession of dc instructions and all personal belongings.

## 2015-11-09 NOTE — Discharge Instructions (Signed)
Respiratory failure is when your lungs are not working well and your breathing (respiratory) system fails. When respiratory failure occurs, it is difficult for your lungs to get enough oxygen, get rid of carbon dioxide, or both. Respiratory failure can be life threatening.  °Respiratory failure can be acute or chronic. Acute respiratory failure is sudden, severe, and requires emergency medical treatment. Chronic respiratory failure is less severe, happens over time, and requires ongoing treatment.  °WHAT ARE THE CAUSES OF ACUTE RESPIRATORY FAILURE?  °Any problem affecting the heart or lungs can cause acute respiratory failure. Some of these causes include the following: °· Chronic bronchitis and emphysema (COPD).   °· Blood clot going to a lung (pulmonary embolism).   °· Having water in the lungs caused by heart failure, lung injury, or infection (pulmonary edema).   °· Collapsed lung (pneumothorax).   °· Pneumonia.   °· Pulmonary fibrosis.   °· Obesity.   °· Asthma.   °· Heart failure.   °· Any type of trauma to the chest that can make breathing difficult.   °· Nerve or muscle diseases making chest movements difficult. °HOW WILL MY ACUTE RESPIRATORY FAILURE BE TREATED?  °Treatment of acute respiratory failure depends on the cause of the respiratory failure. Usually, you will stay in the intensive care unit so your breathing can be watched closely. Treatment can include the following: °· Oxygen. Oxygen can be delivered through the following: °¨ Nasal cannula. This is small tubing that goes in your nose to give you oxygen. °¨ Face mask. A face mask covers your nose and mouth to give you oxygen. °· Medicine. Different medicines can be given to help with breathing. These can include: °¨ Nebulizers. Nebulizers deliver medicines to open the air passages (bronchodilators). These medicines help to open or relax the airways in the lungs so you can breathe better. They can also help loosen mucus from your  lungs. °¨ Diuretics. Diuretic medicines can help you breathe better by getting rid of extra water in your body. °¨ Steroids. Steroid medicines can help decrease swelling (inflammation) in your lungs. °¨ Antibiotics. °· Chest tube. If you have a collapsed lung (pneumothorax), a chest tube is placed to help reinflate the lung. °· Noninvasive positive pressure ventilation (NPPV). This is a tight-fitting mask that goes over your nose and mouth. The mask has tubing that is attached to a machine. The machine blows air into the tubing, which helps to keep the tiny air sacs (alveoli) in your lungs open. This machine allows you to breathe on your own. °· Ventilator. A ventilator is a breathing machine. When on a ventilator, a breathing tube is put into the lungs. A ventilator is used when you can no longer breathe well enough on your own. You may have low oxygen levels or high carbon dioxide (CO2) levels in your blood. When you are on a ventilator, sedation and pain medicines are given to make you sleep so your lungs can heal. °SEEK IMMEDIATE MEDICAL CARE IF: °· You have shortness of breath (dyspnea) with or without activity. °· You have rapid breathing (tachypnea). °· You are wheezing. °· You are unable to say more than a few words without having to catch your breath. °· You find it very difficult to function normally. °· You have a fast heart rate. °· You have a bluish color to your finger or toe nail beds. °· You have confusion or drowsiness or both. °  °This information is not intended to replace advice given to you by your health care provider. Make sure you discuss   any questions you have with your health care provider. °  °Document Released: 08/09/2013 Document Revised: 04/25/2015 Document Reviewed: 08/09/2013 °Elsevier Interactive Patient Education ©2016 [employer] ° °

## 2015-11-09 NOTE — Progress Notes (Addendum)
MD placed order for sleep study. Spoke with pt concerning HH needs, pt declined HH at present time. Pt states she will only need CPAP machine, pt understands that she will need to have sleep study.

## 2015-11-09 NOTE — Evaluation (Signed)
Physical Therapy Evaluation Patient Details Name: [name] MRN: [id number] DOB: [birth year] Today's Date: 11/09/2015   History of Present Illness  65 yo female admitted with acute resp failure. Hx of met breast cancer, L UE DVT, DM, HTN, morbid obesity.   Clinical Impression  On eval, pt required Min assist to get to EOB and Min guard assist to stand, ambulate ~50 feet with RW. At baseline, pt can only tolerate short distance ambulation. Discussed d/c plan-pt plans to return home on today. She politely declines HHPT follow up.     Follow Up Recommendations No PT follow up;Supervision - Intermittent (pt politely declines HHPT follow up)    Equipment Recommendations  None recommended by PT    Recommendations for Other Services       Precautions / Restrictions Precautions Precautions: Fall Restrictions Weight Bearing Restrictions: No      Mobility  Bed Mobility Overal bed mobility: Needs Assistance Bed Mobility: Supine to Sit     Supine to sit: Min assist     General bed mobility comments: Assist for trunk to upright. Increased time.   Transfers Overall transfer level: Needs assistance Equipment used: Rolling walker (2 wheeled) Transfers: Sit to/from Stand Sit to Stand: Min guard         General transfer comment: close guard for safety. VCs hand placement  Ambulation/Gait Ambulation/Gait assistance: Min guard Ambulation Distance (Feet): 50 Feet Assistive device: Rolling walker (2 wheeled) Gait Pattern/deviations: Step-through pattern;Decreased stride length     General Gait Details: close guard for safety. slow gait speed. Pt limited by back pain-walks short distances at baseline. Remained on 3L [place] O2. Dyspnea 2/4.  Stairs            Wheelchair Mobility    Modified Rankin (Stroke Patients Only)       Balance Overall balance assessment: Needs assistance         Standing balance support: During functional activity Standing balance-Leahy  Scale: Fair                               Pertinent Vitals/Pain Pain Assessment: 0-10 Pain Score: 8  Pain Location: chronic back pain    Home Living Family/patient expects to be discharged to:: Private residence Living Arrangements: Spouse/significant other;Children;Other relatives Available Help at Discharge: Family Type of Home: House Home Access: Stairs to enter   [employer] of Steps: 2 Home Layout: One level Home Equipment: Other (comment);Walker - 2 wheels;Wheelchair - manual;Shower seat (oxygen)      Prior Function Level of Independence: Needs assistance   Gait / Transfers Assistance Needed: walks without walker usually.   ADL's / Homemaking Assistance Needed: sponge bathe        Hand Dominance        Extremity/Trunk Assessment   Upper Extremity Assessment: Generalized weakness           Lower Extremity Assessment: Generalized weakness      Cervical / Trunk Assessment: Normal  Communication   Communication: No difficulties  Cognition Arousal/Alertness: Awake/alert Behavior During Therapy: WFL for tasks assessed/performed Overall Cognitive Status: Within Functional Limits for tasks assessed                      General Comments      Exercises        Assessment/Plan    PT Assessment Patent does not need any further PT services  PT Diagnosis Difficulty walking;Generalized weakness  PT Problem List Decreased strength;Decreased activity tolerance;Decreased balance;Decreased mobility;Decreased knowledge of use of DME;Pain;Obesity  PT Treatment Interventions     PT Goals (Current goals can be found in the Care Plan section) Acute Rehab PT Goals Patient Stated Goal: home soon PT Goal Formulation: All assessment and education complete, DC therapy    Frequency     Barriers to discharge        Co-evaluation               End of Session   Activity Tolerance: Patient tolerated treatment well Patient  left: in chair;with call bell/phone within reach;with chair alarm set           Time: 1350-1423 PT Time Calculation (min) (ACUTE ONLY): 33 min   Charges:   PT Evaluation $PT Eval Low Complexity: 1 Procedure PT Treatments $Gait Training: 8-22 mins   PT G Codes:        Weston Anna, MPT Pager: [phone]

## 2015-11-09 NOTE — Progress Notes (Signed)
Informed by RN that pt will need CPAP machine. There was no CPAP orders.  Informed RN that pt will need Out patient Sleep Study and MD need to place orders. MD was texted this information.

## 2015-11-10 LAB — GLUCOSE, CAPILLARY: Glucose-Capillary: 152 mg/dL — ABNORMAL HIGH (ref 65–99)

## 2015-11-10 LAB — CULTURE, BLOOD (ROUTINE X 2)
Culture: NO GROWTH
Culture: NO GROWTH

## 2015-11-12 ENCOUNTER — Telehealth: Payer: Self-pay | Admitting: *Deleted

## 2015-11-12 ENCOUNTER — Telehealth: Payer: Self-pay | Admitting: Nurse Practitioner

## 2015-11-12 ENCOUNTER — Telehealth: Payer: Self-pay

## 2015-11-12 ENCOUNTER — Other Ambulatory Visit: Payer: Self-pay | Admitting: *Deleted

## 2015-11-12 NOTE — Telephone Encounter (Signed)
cld & spoke to pt and gave pt time & date of r/s appt for 3/31 @ 11:15

## 2015-11-12 NOTE — Telephone Encounter (Signed)
Patient calling this morning c/o N/V, productive cough, and would like to cancel infusion appt for tomorrow.  Val, RN to speak with patient nad come up with a plan.  Patient very tearful and "scared" that she is "going to get sick again."

## 2015-11-12 NOTE — Telephone Encounter (Signed)
Per staff message and POF I have scheduled appts. Advised scheduler of appts. JMW  

## 2015-11-12 NOTE — Progress Notes (Signed)
This RN spoke with pt per her call requesting to reschedule her appointment for tomorrow for chemo.  Per discussion [name] states she was d/ced from the hospital recently for pneumonia and an urinary tract infection.  " it seems I only got more sick after my last treatment and then ended up in the hospital "  Presently [name] states she is slowly feeling stronger but still having a severe cough that often results in vomiting.  She denies any fevers.  She states she is hydrating well despite the above.  She is just now getting her appetite back and able to eat soft foods such as soup and yogart.  She is still on antibiotics ordered post d/c.  " I am not sleeping well yet but then I am now on that CPAP machine and I am not used to it "  Per discussion of above with pt verifying overall improvement from inpt status - appointment will be rescheduled to later this week with visit prior to treatment.  Pt agrees to the above.  URGENT POF sent to scheduling per above.

## 2015-11-13 ENCOUNTER — Other Ambulatory Visit: Payer: Medicare Other

## 2015-11-13 ENCOUNTER — Ambulatory Visit: Payer: Medicare Other

## 2015-11-16 ENCOUNTER — Ambulatory Visit (HOSPITAL_BASED_OUTPATIENT_CLINIC_OR_DEPARTMENT_OTHER): Payer: Medicare Other | Admitting: Nurse Practitioner

## 2015-11-16 ENCOUNTER — Telehealth: Payer: Self-pay | Admitting: Nurse Practitioner

## 2015-11-16 ENCOUNTER — Other Ambulatory Visit (HOSPITAL_BASED_OUTPATIENT_CLINIC_OR_DEPARTMENT_OTHER): Payer: Medicare Other

## 2015-11-16 ENCOUNTER — Encounter: Payer: Self-pay | Admitting: Nurse Practitioner

## 2015-11-16 ENCOUNTER — Ambulatory Visit (HOSPITAL_BASED_OUTPATIENT_CLINIC_OR_DEPARTMENT_OTHER): Payer: Medicare Other

## 2015-11-16 VITALS — BP 127/76 | HR 77 | Temp 98.1°F | Resp 18 | Ht 64.0 in | Wt 305.3 lb

## 2015-11-16 DIAGNOSIS — Z5112 Encounter for antineoplastic immunotherapy: Secondary | ICD-10-CM | POA: Diagnosis not present

## 2015-11-16 DIAGNOSIS — C50912 Malignant neoplasm of unspecified site of left female breast: Secondary | ICD-10-CM

## 2015-11-16 DIAGNOSIS — C50212 Malignant neoplasm of upper-inner quadrant of left female breast: Secondary | ICD-10-CM

## 2015-11-16 DIAGNOSIS — J9602 Acute respiratory failure with hypercapnia: Secondary | ICD-10-CM

## 2015-11-16 DIAGNOSIS — C50919 Malignant neoplasm of unspecified site of unspecified female breast: Secondary | ICD-10-CM

## 2015-11-16 DIAGNOSIS — C7802 Secondary malignant neoplasm of left lung: Secondary | ICD-10-CM

## 2015-11-16 DIAGNOSIS — G4730 Sleep apnea, unspecified: Secondary | ICD-10-CM

## 2015-11-16 DIAGNOSIS — C7951 Secondary malignant neoplasm of bone: Secondary | ICD-10-CM

## 2015-11-16 DIAGNOSIS — I82622 Acute embolism and thrombosis of deep veins of left upper extremity: Secondary | ICD-10-CM

## 2015-11-16 DIAGNOSIS — C7800 Secondary malignant neoplasm of unspecified lung: Principal | ICD-10-CM

## 2015-11-16 LAB — CBC WITH DIFFERENTIAL/PLATELET
BASO%: 0.2 % (ref 0.0–2.0)
Basophils Absolute: 0 10*3/uL (ref 0.0–0.1)
EOS%: 2 % (ref 0.0–7.0)
Eosinophils Absolute: 0.1 10*3/uL (ref 0.0–0.5)
HCT: 43.3 % (ref 34.8–46.6)
HGB: 14.1 g/dL (ref 11.6–15.9)
LYMPH%: 28.6 % (ref 14.0–49.7)
MCH: 27.8 pg (ref 25.1–34.0)
MCHC: 32.6 g/dL (ref 31.5–36.0)
MCV: 85.2 fL (ref 79.5–101.0)
MONO#: 0.5 10*3/uL (ref 0.1–0.9)
MONO%: 9.5 % (ref 0.0–14.0)
NEUT#: 3.3 10*3/uL (ref 1.5–6.5)
NEUT%: 59.7 % (ref 38.4–76.8)
Platelets: 262 10*3/uL (ref 145–400)
RBC: 5.08 10*6/uL (ref 3.70–5.45)
RDW: 14.8 % — ABNORMAL HIGH (ref 11.2–14.5)
WBC: 5.6 10*3/uL (ref 3.9–10.3)
lymph#: 1.6 10*3/uL (ref 0.9–3.3)

## 2015-11-16 LAB — COMPREHENSIVE METABOLIC PANEL
ALT: 129 U/L — ABNORMAL HIGH (ref 0–55)
AST: 79 U/L — ABNORMAL HIGH (ref 5–34)
Albumin: 3.1 g/dL — ABNORMAL LOW (ref 3.5–5.0)
Alkaline Phosphatase: 158 U/L — ABNORMAL HIGH (ref 40–150)
Anion Gap: 7 mEq/L (ref 3–11)
BUN: 11.9 mg/dL (ref 7.0–26.0)
CO2: 31 mEq/L — ABNORMAL HIGH (ref 22–29)
Calcium: 9.7 mg/dL (ref 8.4–10.4)
Chloride: 98 mEq/L (ref 98–109)
Creatinine: 0.8 mg/dL (ref 0.6–1.1)
EGFR: 77 mL/min/{1.73_m2} — ABNORMAL LOW (ref >90)
Glucose: 256 mg/dl — ABNORMAL HIGH (ref 70–140)
Potassium: 4.3 mEq/L (ref 3.5–5.1)
Sodium: 135 mEq/L — ABNORMAL LOW (ref 136–145)
Total Bilirubin: 0.68 mg/dL (ref 0.20–1.20)
Total Protein: 6.9 g/dL (ref 6.4–8.3)

## 2015-11-16 MED ORDER — SODIUM CHLORIDE 0.9 % IV SOLN
420.0000 mg | Freq: Once | INTRAVENOUS | Status: AC
  Administered 2015-11-16: 420 mg via INTRAVENOUS
  Filled 2015-11-16: qty 14

## 2015-11-16 MED ORDER — HEPARIN SOD (PORK) LOCK FLUSH 100 UNIT/ML IV SOLN
500.0000 [IU] | Freq: Once | INTRAVENOUS | Status: AC | PRN
  Administered 2015-11-16: 500 [IU]
  Filled 2015-11-16: qty 5

## 2015-11-16 MED ORDER — ACETAMINOPHEN 325 MG PO TABS
650.0000 mg | ORAL_TABLET | Freq: Once | ORAL | Status: AC
  Administered 2015-11-16: 650 mg via ORAL

## 2015-11-16 MED ORDER — ACETAMINOPHEN 325 MG PO TABS
ORAL_TABLET | ORAL | Status: AC
  Filled 2015-11-16: qty 2

## 2015-11-16 MED ORDER — SODIUM CHLORIDE 0.9 % IJ SOLN
10.0000 mL | INTRAMUSCULAR | Status: DC | PRN
  Administered 2015-11-16: 10 mL
  Filled 2015-11-16: qty 10

## 2015-11-16 MED ORDER — DIPHENHYDRAMINE HCL 25 MG PO CAPS
25.0000 mg | ORAL_CAPSULE | Freq: Once | ORAL | Status: AC
  Administered 2015-11-16: 25 mg via ORAL

## 2015-11-16 MED ORDER — LORAZEPAM 2 MG/ML IJ SOLN
0.5000 mg | Freq: Once | INTRAMUSCULAR | Status: AC
  Administered 2015-11-16: 0.5 mg via INTRAVENOUS

## 2015-11-16 MED ORDER — DIPHENHYDRAMINE HCL 25 MG PO CAPS
ORAL_CAPSULE | ORAL | Status: AC
  Filled 2015-11-16: qty 1

## 2015-11-16 MED ORDER — SODIUM CHLORIDE 0.9 % IV SOLN
Freq: Once | INTRAVENOUS | Status: AC
  Administered 2015-11-16: 13:00:00 via INTRAVENOUS

## 2015-11-16 MED ORDER — TRASTUZUMAB CHEMO INJECTION 440 MG
6.0000 mg/kg | Freq: Once | INTRAVENOUS | Status: AC
  Administered 2015-11-16: 903 mg via INTRAVENOUS
  Filled 2015-11-16: qty 43

## 2015-11-16 MED ORDER — LORAZEPAM 2 MG/ML IJ SOLN
INTRAMUSCULAR | Status: AC
  Filled 2015-11-16: qty 1

## 2015-11-16 NOTE — Progress Notes (Signed)
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 1107   BUN 8 11/09/2015 0415   CREATININE 0.8 11/16/2015 1107   CREATININE 0.55 11/09/2015 0415   CREATININE 0.77 12/10/2011 1134      Component Value Date/Time   CALCIUM 9.7 11/16/2015 1107   CALCIUM 8.8* 11/09/2015 0415   ALKPHOS 158* 11/16/2015 1107   ALKPHOS 167* 11/03/2015 1930   AST 79* 11/16/2015 1107   AST 154* 11/03/2015 1930   ALT 129* 11/16/2015 1107   ALT 166* 11/03/2015 1930   BILITOT 0.68 11/16/2015 1107   BILITOT 0.2* 11/03/2015 1930       STUDIES: Dg Chest 2 View  11/07/2015  CLINICAL DATA:  Cough, fever, htn, hx of breast ca EXAM: CHEST - 2 VIEW COMPARISON:  11/03/2015 FINDINGS: Relatively low volumes with patchy atelectasis in the lung bases, increased since previous exam. Heart size within normal limits for technique. Stable right IJ port catheter. No pneumothorax. No effusion. Spurring in the lower thoracic spine. IMPRESSION: Low volumes with bibasilar atelectasis. Electronically Signed   By: Lucrezia Europe M.D.   On: 11/07/2015 10:59   Dg Chest 2 View  11/03/2015  CLINICAL DATA:  Acute onset of central chest pain, cough and left arm swelling. Shortness of breath,  lightheadedness, generalized weakness, nausea and vomiting. Initial encounter. EXAM: CHEST  2 VIEW COMPARISON:  Chest radiograph performed 09/28/2014, and CT of the chest performed 07/05/2015 FINDINGS: The lungs are well-aerated. Mild bibasilar opacities may reflect atelectasis or possibly mild pneumonia, depending on the patient's symptoms. Mild vascular congestion is seen. There is no evidence of pleural effusion or pneumothorax. The heart is borderline normal in size. A right-sided chest port is noted ending about the distal SVC. No acute osseous abnormalities are seen. IMPRESSION: Mild bibasilar opacities may reflect atelectasis or possibly mild pneumonia, depending on the patient's symptoms. Mild vascular congestion seen. Electronically Signed   By: Garald Balding M.D.   On: 11/03/2015 19:34   Ct Angio Chest Pe W/cm &/or Wo Cm  11/03/2015  CLINICAL DATA:  Central chest pain and cough, onset yesterday. EXAM: CT ANGIOGRAPHY CHEST WITH CONTRAST TECHNIQUE: Multidetector CT imaging of the chest was performed using the standard protocol during bolus administration of intravenous contrast. Multiplanar CT image reconstructions and MIPs were obtained to evaluate the vascular anatomy. CONTRAST:  158m OMNIPAQUE IOHEXOL 350 MG/ML SOLN COMPARISON:  07/05/2015 FINDINGS: Cardiovascular: There is good opacification of the pulmonary arteries. There is no pulmonary embolism. The thoracic aorta is normal in caliber and intact. Lungs: Minimal pleural thickening in the fissures, as well as mild unchanged curvilinear scarring. This is stable from 07/05/2015. Central airways: Patent Effusions: None Lymphadenopathy: Nonspecific nodes in the mediastinum and hila. No pathologic adenopathy. A few physiologic appearing nodes are visible in the axillary regions bilaterally. Esophagus: Unremarkable Upper abdomen: No significant abnormality Musculoskeletal: No significant abnormality mild degenerative thoracic disc disease. Review of the  MIP images confirms the above findings. IMPRESSION: Negative for acute pulmonary embolism. Unchanged mild curvilinear scarring bilaterally. Electronically Signed   By: DAndreas NewportM.D.   On: 11/03/2015 23:53   Ct Shoulder Left W Contrast  11/05/2015  CLINICAL DATA:  Left shoulder pain and swelling. History of breast cancer. EXAM: CT OF THE LEFT SHOULDER WITH CONTRAST TECHNIQUE: Multidetector CT imaging was performed following the standard protocol during bolus administration of intravenous contrast. CONTRAST:  1091mOMNIPAQUE IOHEXOL 300 MG/ML  SOLN COMPARISON:  None. FINDINGS: No acute fracture or dislocation. No lytic or sclerotic osseous lesion. No bone destruction. The muscles are normal. There is no muscle atrophy. There is no fluid collection or hematoma. There is no soft tissue mass. There is no left axillary lymphadenopathy. The visualized left lung is clear. IMPRESSION: 1. No acute osseous abnormality of the left shoulder. Electronically Signed   By: Kathreen Devoid   On: 11/05/2015 16:06    ASSESSMENT: 65 y.o. [name] with stage IV breast cancer  (1) s/p left breast biopsy 11/05/2012 for a clinical T1c NX M1, stage IV invasive ductal carcinoma, grade 3, estrogen and progesterone receptor negative, with an MIB-1 of 77%, and HER-2 amplified by CISH with a ratio of 4.39.  (2) chest, abdomen and pelvis CT scans and PET scan April 2014 showed multiple bilateral pulmonaru nodules but no liver or bone involvement; biopsy of a pulmonary nodule on 11/30/2012 confirmed metastatic breast cancer.   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  As documented in Dr. Virgie Dad previous note, " if and when we documented disease progression we will change to fulvestrant and Palbociclib."   (9) obstructive sleep apnea, wearing CPAP nightly    PLAN:  Uri had a CT angio of the chest performed while in the hospital that showed no evidence of breast cancer recurrence in the thorax, so we will use this to replace the restaging scan that was going to be performed later this Spring. She is going to continue on letrozole daily with trastuzumab and pertuzumab given every 3 weeks as she has been doing. She will be due for a repeat echocardiogram in 3 months.   I am sending her to Dr. Melvyn Novas or Chase Caller to evaluate her pulmonary issues. She also needs a sleep study performed so that her insurance will take over the "rent" payment for her CPAP.   Sheray will return in 6 weeks for follow up. She understands and agrees with this plan. She knows the goal of treatment in her case is control. She has been encouraged to call with any issues that might arise before her next visit here.   Laurie Panda, NP 11/16/2015

## 2015-11-16 NOTE — Patient Instructions (Signed)
[facility] Discharge Instructions for Patients Receiving Chemotherapy  Today you received the following chemotherapy agents:  Herceptin, Perjeta  To help prevent nausea and vomiting after your treatment, we encourage you to take your nausea medication as prescribed.   If you develop nausea and vomiting that is not controlled by your nausea medication, call the clinic.   BELOW ARE SYMPTOMS THAT SHOULD BE REPORTED IMMEDIATELY:  *FEVER GREATER THAN 100.5 F  *CHILLS WITH OR WITHOUT FEVER  NAUSEA AND VOMITING THAT IS NOT CONTROLLED WITH YOUR NAUSEA MEDICATION  *UNUSUAL SHORTNESS OF BREATH  *UNUSUAL BRUISING OR BLEEDING  TENDERNESS IN MOUTH AND THROAT WITH OR WITHOUT PRESENCE OF ULCERS  *URINARY PROBLEMS  *BOWEL PROBLEMS  UNUSUAL RASH Items with * indicate a potential emergency and should be followed up as soon as possible.  Feel free to call the clinic you have any questions or concerns. The clinic phone number is [phone].  Please show the [place] at check-in to the Emergency Department and triage nurse.

## 2015-11-16 NOTE — Telephone Encounter (Signed)
appt made and avs will print in treatment room

## 2015-11-23 ENCOUNTER — Telehealth: Payer: Self-pay | Admitting: Family Medicine

## 2015-11-23 NOTE — Telephone Encounter (Signed)
Called spoke with her asked to make an appointment she agrees

## 2015-11-30 ENCOUNTER — Other Ambulatory Visit: Payer: Self-pay

## 2015-11-30 DIAGNOSIS — M48061 Spinal stenosis, lumbar region without neurogenic claudication: Secondary | ICD-10-CM

## 2015-11-30 DIAGNOSIS — C7802 Secondary malignant neoplasm of left lung: Principal | ICD-10-CM

## 2015-11-30 DIAGNOSIS — C50212 Malignant neoplasm of upper-inner quadrant of left female breast: Secondary | ICD-10-CM

## 2015-11-30 DIAGNOSIS — M47813 Spondylosis without myelopathy or radiculopathy, cervicothoracic region: Secondary | ICD-10-CM

## 2015-11-30 DIAGNOSIS — C50912 Malignant neoplasm of unspecified site of left female breast: Secondary | ICD-10-CM

## 2015-11-30 MED ORDER — OXYCODONE HCL ER 10 MG PO T12A: 10.0000 mg | EXTENDED_RELEASE_TABLET | Freq: Two times a day (BID) | ORAL | Status: DC

## 2015-11-30 MED FILL — OxyCONTIN 10 MG T12A: 10 | 30 days supply | Qty: 60 | Fill #0

## 2015-11-30 NOTE — Telephone Encounter (Signed)
Patient called for an oxycontin refill which is appropriate at this time.  Patient states her pain is well managed.

## 2015-12-04 ENCOUNTER — Ambulatory Visit (HOSPITAL_BASED_OUTPATIENT_CLINIC_OR_DEPARTMENT_OTHER): Payer: Medicare Other

## 2015-12-04 ENCOUNTER — Other Ambulatory Visit (HOSPITAL_BASED_OUTPATIENT_CLINIC_OR_DEPARTMENT_OTHER): Payer: Medicare Other

## 2015-12-04 VITALS — BP 133/55 | HR 71 | Temp 98.1°F | Resp 20

## 2015-12-04 DIAGNOSIS — Z5112 Encounter for antineoplastic immunotherapy: Secondary | ICD-10-CM | POA: Diagnosis not present

## 2015-12-04 DIAGNOSIS — C50919 Malignant neoplasm of unspecified site of unspecified female breast: Secondary | ICD-10-CM

## 2015-12-04 DIAGNOSIS — C50912 Malignant neoplasm of unspecified site of left female breast: Secondary | ICD-10-CM

## 2015-12-04 DIAGNOSIS — C7951 Secondary malignant neoplasm of bone: Secondary | ICD-10-CM

## 2015-12-04 DIAGNOSIS — C7802 Secondary malignant neoplasm of left lung: Secondary | ICD-10-CM

## 2015-12-04 DIAGNOSIS — C50212 Malignant neoplasm of upper-inner quadrant of left female breast: Secondary | ICD-10-CM

## 2015-12-04 DIAGNOSIS — C7800 Secondary malignant neoplasm of unspecified lung: Principal | ICD-10-CM

## 2015-12-04 LAB — COMPREHENSIVE METABOLIC PANEL
ALT: 147 U/L — ABNORMAL HIGH (ref 0–55)
AST: 81 U/L — ABNORMAL HIGH (ref 5–34)
Albumin: 3.1 g/dL — ABNORMAL LOW (ref 3.5–5.0)
Alkaline Phosphatase: 163 U/L — ABNORMAL HIGH (ref 40–150)
Anion Gap: 10 mEq/L (ref 3–11)
BUN: 13.9 mg/dL (ref 7.0–26.0)
CO2: 30 mEq/L — ABNORMAL HIGH (ref 22–29)
Calcium: 9.8 mg/dL (ref 8.4–10.4)
Chloride: 98 mEq/L (ref 98–109)
Creatinine: 0.9 mg/dL (ref 0.6–1.1)
EGFR: 68 mL/min/{1.73_m2} — ABNORMAL LOW (ref >90)
Glucose: 392 mg/dl — ABNORMAL HIGH (ref 70–140)
Potassium: 3.8 mEq/L (ref 3.5–5.1)
Sodium: 138 mEq/L (ref 136–145)
Total Bilirubin: 0.36 mg/dL (ref 0.20–1.20)
Total Protein: 7.3 g/dL (ref 6.4–8.3)

## 2015-12-04 LAB — CBC WITH DIFFERENTIAL/PLATELET
BASO%: 0.3 % (ref 0.0–2.0)
Basophils Absolute: 0 10*3/uL (ref 0.0–0.1)
EOS%: 1.7 % (ref 0.0–7.0)
Eosinophils Absolute: 0.1 10*3/uL (ref 0.0–0.5)
HCT: 41.6 % (ref 34.8–46.6)
HGB: 13.6 g/dL (ref 11.6–15.9)
LYMPH%: 26.1 % (ref 14.0–49.7)
MCH: 27.8 pg (ref 25.1–34.0)
MCHC: 32.6 g/dL (ref 31.5–36.0)
MCV: 85.3 fL (ref 79.5–101.0)
MONO#: 0.3 10*3/uL (ref 0.1–0.9)
MONO%: 5.7 % (ref 0.0–14.0)
NEUT#: 3.7 10*3/uL (ref 1.5–6.5)
NEUT%: 66.2 % (ref 38.4–76.8)
Platelets: 234 10*3/uL (ref 145–400)
RBC: 4.88 10*6/uL (ref 3.70–5.45)
RDW: 15.3 % — ABNORMAL HIGH (ref 11.2–14.5)
WBC: 5.6 10*3/uL (ref 3.9–10.3)
lymph#: 1.5 10*3/uL (ref 0.9–3.3)

## 2015-12-04 MED ORDER — SODIUM CHLORIDE 0.9 % IJ SOLN
10.0000 mL | INTRAMUSCULAR | Status: DC | PRN
  Administered 2015-12-04: 10 mL
  Filled 2015-12-04: qty 10

## 2015-12-04 MED ORDER — SODIUM CHLORIDE 0.9 % IV SOLN
Freq: Once | INTRAVENOUS | Status: AC
  Administered 2015-12-04: 10:00:00 via INTRAVENOUS

## 2015-12-04 MED ORDER — DIPHENHYDRAMINE HCL 25 MG PO CAPS
ORAL_CAPSULE | ORAL | Status: AC
  Filled 2015-12-04: qty 1

## 2015-12-04 MED ORDER — TRASTUZUMAB CHEMO INJECTION 440 MG
6.0000 mg/kg | Freq: Once | INTRAVENOUS | Status: AC
  Administered 2015-12-04: 903 mg via INTRAVENOUS
  Filled 2015-12-04: qty 43

## 2015-12-04 MED ORDER — HEPARIN SOD (PORK) LOCK FLUSH 100 UNIT/ML IV SOLN
500.0000 [IU] | Freq: Once | INTRAVENOUS | Status: AC | PRN
  Administered 2015-12-04: 500 [IU]
  Filled 2015-12-04: qty 5

## 2015-12-04 MED ORDER — LORAZEPAM 2 MG/ML IJ SOLN: 0.5000 mg | Freq: Once | INTRAMUSCULAR | Status: DC

## 2015-12-04 MED ORDER — ACETAMINOPHEN 325 MG PO TABS
ORAL_TABLET | ORAL | Status: AC
  Filled 2015-12-04: qty 2

## 2015-12-04 MED ORDER — PERTUZUMAB CHEMO INJECTION 420 MG/14ML
420.0000 mg | Freq: Once | INTRAVENOUS | Status: AC
  Administered 2015-12-04: 420 mg via INTRAVENOUS
  Filled 2015-12-04: qty 14

## 2015-12-04 MED ORDER — ACETAMINOPHEN 325 MG PO TABS
650.0000 mg | ORAL_TABLET | Freq: Once | ORAL | Status: AC
  Administered 2015-12-04: 650 mg via ORAL

## 2015-12-04 MED ORDER — DIPHENHYDRAMINE HCL 25 MG PO CAPS
25.0000 mg | ORAL_CAPSULE | Freq: Once | ORAL | Status: AC
  Administered 2015-12-04: 25 mg via ORAL

## 2015-12-04 NOTE — Patient Instructions (Signed)
[facility] Discharge Instructions for Patients Receiving Chemotherapy  Today you received the following chemotherapy agents herceptin/perjeta  To help prevent nausea and vomiting after your treatment, we encourage you to take your nausea medication as directed   If you develop nausea and vomiting that is not controlled by your nausea medication, call the clinic.   BELOW ARE SYMPTOMS THAT SHOULD BE REPORTED IMMEDIATELY:  *FEVER GREATER THAN 100.5 F  *CHILLS WITH OR WITHOUT FEVER  NAUSEA AND VOMITING THAT IS NOT CONTROLLED WITH YOUR NAUSEA MEDICATION  *UNUSUAL SHORTNESS OF BREATH  *UNUSUAL BRUISING OR BLEEDING  TENDERNESS IN MOUTH AND THROAT WITH OR WITHOUT PRESENCE OF ULCERS  *URINARY PROBLEMS  *BOWEL PROBLEMS  UNUSUAL RASH Items with * indicate a potential emergency and should be followed up as soon as possible.  Feel free to call the clinic you have any questions or concerns. The clinic phone number is [phone].  

## 2015-12-12 ENCOUNTER — Other Ambulatory Visit: Payer: Self-pay | Admitting: Family Medicine

## 2015-12-12 ENCOUNTER — Encounter: Payer: Self-pay | Admitting: Family Medicine

## 2015-12-12 ENCOUNTER — Ambulatory Visit (INDEPENDENT_AMBULATORY_CARE_PROVIDER_SITE_OTHER): Payer: Medicare Other | Admitting: Family Medicine

## 2015-12-12 VITALS — BP 131/88 | HR 78 | Temp 98.1°F | Ht 64.0 in | Wt 306.0 lb

## 2015-12-12 DIAGNOSIS — G4730 Sleep apnea, unspecified: Secondary | ICD-10-CM

## 2015-12-12 DIAGNOSIS — I10 Essential (primary) hypertension: Secondary | ICD-10-CM

## 2015-12-12 DIAGNOSIS — E1149 Type 2 diabetes mellitus with other diabetic neurological complication: Secondary | ICD-10-CM

## 2015-12-12 LAB — POCT GLYCOSYLATED HEMOGLOBIN (HGB A1C): Hemoglobin A1C: 8.9

## 2015-12-12 MED ORDER — ASPIRIN 81 MG PO TBEC: 81.0000 mg | DELAYED_RELEASE_TABLET | Freq: Every day | ORAL | Status: DC

## 2015-12-12 MED ORDER — HYDROCORTISONE 0.5 % EX OINT: 1.0000 "application " | TOPICAL_OINTMENT | Freq: Two times a day (BID) | CUTANEOUS | Status: DC

## 2015-12-12 NOTE — Assessment & Plan Note (Signed)
BP Readings from Last 3 Encounters:  12/12/15 131/88  12/04/15 133/55  11/16/15 127/76   At goal today.  Continue current medications

## 2015-12-12 NOTE — Progress Notes (Signed)
   Subjective:    Patient ID: SAMARRA RIDGELY, female    DOB: [birth year], 65 y.o.   MRN: [id number]  HPI  DIABETES Disease Monitoring: Blood Sugar ranges(Severity) -did not bring readings thinks they are usually in 2-300s  Associated Symptoms- Polyuria/phagia/dipsia- no      Visual problems- no Medications: Compliance(Modifying factor) - using lantus 20 u about 3/7 evenings - has trouble remembering Hypoglycemic symptoms- none Timing - continuous  HYPERTENSION Disease Monitoring Home BP Monitoring (Severity) not checking Symptoms - Chest pain- no    Dyspnea- no more than baseline Medications(Modifying factors) Compliance-  Thinks she takes her medications most mornings. Lightheadedness-  no  Edema- comes and goes  Timing - continuous  Duration - years ROS - See HPI  PMH Lab Review   POTASSIUM  Date Value Ref Range Status  12/04/2015 3.8 3.5 - 5.1 mEq/L Final  11/09/2015 3.4* 3.5 - 5.1 mmol/L Final   SODIUM  Date Value Ref Range Status  12/04/2015 138 136 - 145 mEq/L Final  11/09/2015 143 135 - 145 mmol/L Final   CREATININE  Date Value Ref Range Status  12/04/2015 0.9 0.6 - 1.1 mg/dL Final   CREAT  Date Value Ref Range Status  12/10/2011 0.77 0.50 - 1.10 mg/dL Final   CREATININE, SER  Date Value Ref Range Status  11/09/2015 0.55 0.44 - 1.00 mg/dL Final       Chief Complaint noted Review of Symptoms - see HPI PMH - Smoking status noted.   Vital Signs reviewed     Review of Systems     Objective:   Physical Exam  Sitting wheelchair Lungs:  Normal respiratory effort, chest expands symmetrically. Lungs are clear to auscultation, no crackles or wheezes. Heart - distant RRR Extrem Feet - R foot diffusely dry and rough with scaling skin.  Left foot small patches of roughness.  No skin breakdown. Diabetic Foot Check -  Appearance - no lesions, ulcers or calluses Monofilament testing -  Right - Great toe, medial, central, lateral ball and posterior foot  intact Left - Great toe, medial, central, lateral ball and posterior foot intact       Assessment & Plan:

## 2015-12-12 NOTE — Assessment & Plan Note (Signed)
Relates is having to pay for her CPAP at home and it does not fit well so she does not wear it long.  Will need to investigate prior sleep studies and current provider

## 2015-12-12 NOTE — Assessment & Plan Note (Signed)
Not at goal.  Likely due to inability to remember to take bed time lantus.  Change to AM and increase dose.  ASk her to monitor her blood sugars

## 2015-12-12 NOTE — Patient Instructions (Addendum)
Good to see you today!  Thanks for coming in.  Use the ointment on your feet each evening  For the Diabetes    Use Lantus 25 units every morning      Continue the novolog 10 u three times a day    Check your blood sugar every morning when you first wake up or if you feel it is too low   Schedule a mammogram    I will check on your CPAP -   Come back in 1 month

## 2015-12-13 ENCOUNTER — Telehealth: Payer: Self-pay | Admitting: Family Medicine

## 2015-12-13 NOTE — Telephone Encounter (Signed)
Need an order faxed to [place] for a portable oxygen contractor for best fit e-valve

## 2015-12-13 NOTE — Telephone Encounter (Signed)
Will forward to MD to write order in Epic for DME and we can fax this to [facility] [phone]. Jazmin Hartsell,CMA

## 2015-12-13 NOTE — Telephone Encounter (Signed)
I have not prescribed Oxygen for her nor do I know the indication.  The ordering physician would need to order this  Pls notify [facility]  Thanks  [place]

## 2015-12-14 ENCOUNTER — Other Ambulatory Visit: Payer: Self-pay | Admitting: *Deleted

## 2015-12-14 NOTE — Telephone Encounter (Signed)
Refill request for 90 day supply.  Martin, Tamika L, RN  

## 2015-12-17 MED ORDER — AMLODIPINE BESYLATE 5 MG PO TABS: 5.0000 mg | ORAL_TABLET | Freq: Every day | ORAL | Status: DC

## 2015-12-18 NOTE — Telephone Encounter (Signed)
Patient is aware of this and would like to be referred to a pulmonologist.  She has a DM follow up on 01/09/16 and will discuss these concerns then. Jazmin Hartsell,CMA

## 2015-12-18 NOTE — Telephone Encounter (Signed)
Spoke with [facility] and they state that patient was originally given her oxygen with her hospital discharge in 2016 from Dr. Jacquelynn Cree.  Will check with MD to see if patient should come in for an oxygen evaluation for a portable machine. Jazmin Hartsell,CMA

## 2015-12-18 NOTE — Telephone Encounter (Signed)
If patient has a pulmonologist she should see them for this.  If not she should make an appointment to see me  Thanks  [place]

## 2015-12-19 ENCOUNTER — Other Ambulatory Visit: Payer: Self-pay | Admitting: Family Medicine

## 2015-12-20 ENCOUNTER — Ambulatory Visit (HOSPITAL_COMMUNITY)
Admission: RE | Admit: 2015-12-20 | Discharge: 2015-12-20 | Disposition: A | Discharge status: Home / Self Care | Payer: Medicare Other | Source: Ambulatory Visit | Attending: Oncology | Admitting: Oncology

## 2015-12-20 ENCOUNTER — Encounter (HOSPITAL_COMMUNITY): Payer: Self-pay

## 2015-12-20 DIAGNOSIS — C7802 Secondary malignant neoplasm of left lung: Secondary | ICD-10-CM | POA: Insufficient documentation

## 2015-12-20 DIAGNOSIS — E1149 Type 2 diabetes mellitus with other diabetic neurological complication: Secondary | ICD-10-CM | POA: Insufficient documentation

## 2015-12-20 DIAGNOSIS — C50912 Malignant neoplasm of unspecified site of left female breast: Secondary | ICD-10-CM | POA: Insufficient documentation

## 2015-12-20 DIAGNOSIS — R918 Other nonspecific abnormal finding of lung field: Secondary | ICD-10-CM | POA: Insufficient documentation

## 2015-12-20 DIAGNOSIS — C50212 Malignant neoplasm of upper-inner quadrant of left female breast: Secondary | ICD-10-CM

## 2015-12-20 DIAGNOSIS — E0842 Diabetes mellitus due to underlying condition with diabetic polyneuropathy: Secondary | ICD-10-CM

## 2015-12-20 MED ORDER — IOPAMIDOL (ISOVUE-300) INJECTION 61%
75.0000 mL | Freq: Once | INTRAVENOUS | Status: AC | PRN
  Administered 2015-12-20: 75 mL via INTRAVENOUS

## 2015-12-25 ENCOUNTER — Telehealth: Payer: Self-pay | Admitting: Nurse Practitioner

## 2015-12-25 ENCOUNTER — Ambulatory Visit (HOSPITAL_BASED_OUTPATIENT_CLINIC_OR_DEPARTMENT_OTHER): Payer: Medicare Other

## 2015-12-25 ENCOUNTER — Ambulatory Visit (HOSPITAL_BASED_OUTPATIENT_CLINIC_OR_DEPARTMENT_OTHER): Payer: Medicare Other | Admitting: Nurse Practitioner

## 2015-12-25 ENCOUNTER — Other Ambulatory Visit (HOSPITAL_BASED_OUTPATIENT_CLINIC_OR_DEPARTMENT_OTHER): Payer: Medicare Other

## 2015-12-25 ENCOUNTER — Encounter: Payer: Self-pay | Admitting: Nurse Practitioner

## 2015-12-25 VITALS — BP 150/63 | HR 48 | Temp 98.1°F | Resp 18 | Ht 64.0 in | Wt 307.8 lb

## 2015-12-25 DIAGNOSIS — C50919 Malignant neoplasm of unspecified site of unspecified female breast: Secondary | ICD-10-CM

## 2015-12-25 DIAGNOSIS — R069 Unspecified abnormalities of breathing: Secondary | ICD-10-CM | POA: Diagnosis not present

## 2015-12-25 DIAGNOSIS — C7800 Secondary malignant neoplasm of unspecified lung: Principal | ICD-10-CM

## 2015-12-25 DIAGNOSIS — C50912 Malignant neoplasm of unspecified site of left female breast: Secondary | ICD-10-CM

## 2015-12-25 DIAGNOSIS — Z5112 Encounter for antineoplastic immunotherapy: Secondary | ICD-10-CM

## 2015-12-25 DIAGNOSIS — C7951 Secondary malignant neoplasm of bone: Secondary | ICD-10-CM

## 2015-12-25 DIAGNOSIS — C7802 Secondary malignant neoplasm of left lung: Secondary | ICD-10-CM

## 2015-12-25 DIAGNOSIS — C50212 Malignant neoplasm of upper-inner quadrant of left female breast: Secondary | ICD-10-CM

## 2015-12-25 DIAGNOSIS — G4730 Sleep apnea, unspecified: Secondary | ICD-10-CM

## 2015-12-25 LAB — COMPREHENSIVE METABOLIC PANEL
ALT: 137 U/L — ABNORMAL HIGH (ref 0–55)
AST: 72 U/L — ABNORMAL HIGH (ref 5–34)
Albumin: 3.4 g/dL — ABNORMAL LOW (ref 3.5–5.0)
Alkaline Phosphatase: 177 U/L — ABNORMAL HIGH (ref 40–150)
Anion Gap: 9 mEq/L (ref 3–11)
BUN: 13.5 mg/dL (ref 7.0–26.0)
CO2: 31 mEq/L — ABNORMAL HIGH (ref 22–29)
Calcium: 9.7 mg/dL (ref 8.4–10.4)
Chloride: 100 mEq/L (ref 98–109)
Creatinine: 0.8 mg/dL (ref 0.6–1.1)
EGFR: 80 mL/min/{1.73_m2} — ABNORMAL LOW (ref >90)
Glucose: 217 mg/dl — ABNORMAL HIGH (ref 70–140)
Potassium: 4.1 mEq/L (ref 3.5–5.1)
Sodium: 140 mEq/L (ref 136–145)
Total Bilirubin: 0.52 mg/dL (ref 0.20–1.20)
Total Protein: 6.8 g/dL (ref 6.4–8.3)

## 2015-12-25 LAB — CBC WITH DIFFERENTIAL/PLATELET
BASO%: 0.4 % (ref 0.0–2.0)
Basophils Absolute: 0 10*3/uL (ref 0.0–0.1)
EOS%: 2 % (ref 0.0–7.0)
Eosinophils Absolute: 0.1 10*3/uL (ref 0.0–0.5)
HCT: 41.2 % (ref 34.8–46.6)
HGB: 13.7 g/dL (ref 11.6–15.9)
LYMPH%: 29.9 % (ref 14.0–49.7)
MCH: 28.4 pg (ref 25.1–34.0)
MCHC: 33.3 g/dL (ref 31.5–36.0)
MCV: 85.5 fL (ref 79.5–101.0)
MONO#: 0.3 10*3/uL (ref 0.1–0.9)
MONO%: 6.4 % (ref 0.0–14.0)
NEUT#: 3.1 10*3/uL (ref 1.5–6.5)
NEUT%: 61.3 % (ref 38.4–76.8)
Platelets: 230 10*3/uL (ref 145–400)
RBC: 4.82 10*6/uL (ref 3.70–5.45)
RDW: 14.9 % — ABNORMAL HIGH (ref 11.2–14.5)
WBC: 5.1 10*3/uL (ref 3.9–10.3)
lymph#: 1.5 10*3/uL (ref 0.9–3.3)
nRBC: 0 % (ref 0–0)

## 2015-12-25 MED ORDER — SODIUM CHLORIDE 0.9 % IV SOLN
Freq: Once | INTRAVENOUS | Status: AC
  Administered 2015-12-25: 12:00:00 via INTRAVENOUS

## 2015-12-25 MED ORDER — SODIUM CHLORIDE 0.9 % IJ SOLN
10.0000 mL | INTRAMUSCULAR | Status: DC | PRN
  Administered 2015-12-25: 10 mL
  Filled 2015-12-25: qty 10

## 2015-12-25 MED ORDER — ACETAMINOPHEN 325 MG PO TABS
ORAL_TABLET | ORAL | Status: AC
  Filled 2015-12-25: qty 2

## 2015-12-25 MED ORDER — HEPARIN SOD (PORK) LOCK FLUSH 100 UNIT/ML IV SOLN
500.0000 [IU] | Freq: Once | INTRAVENOUS | Status: AC | PRN
  Administered 2015-12-25: 500 [IU]
  Filled 2015-12-25: qty 5

## 2015-12-25 MED ORDER — PERTUZUMAB CHEMO INJECTION 420 MG/14ML
420.0000 mg | Freq: Once | INTRAVENOUS | Status: AC
  Administered 2015-12-25: 420 mg via INTRAVENOUS
  Filled 2015-12-25: qty 14

## 2015-12-25 MED ORDER — LORAZEPAM 2 MG/ML IJ SOLN
INTRAMUSCULAR | Status: AC
  Filled 2015-12-25: qty 1

## 2015-12-25 MED ORDER — TRASTUZUMAB CHEMO INJECTION 440 MG
6.0000 mg/kg | Freq: Once | INTRAVENOUS | Status: AC
  Administered 2015-12-25: 903 mg via INTRAVENOUS
  Filled 2015-12-25: qty 43

## 2015-12-25 MED ORDER — ACETAMINOPHEN 325 MG PO TABS
650.0000 mg | ORAL_TABLET | Freq: Once | ORAL | Status: AC
  Administered 2015-12-25: 650 mg via ORAL

## 2015-12-25 MED ORDER — DIPHENHYDRAMINE HCL 25 MG PO CAPS
ORAL_CAPSULE | ORAL | Status: AC
  Filled 2015-12-25: qty 1

## 2015-12-25 MED ORDER — LORAZEPAM 2 MG/ML IJ SOLN: 0.5000 mg | Freq: Once | INTRAMUSCULAR | Status: DC

## 2015-12-25 MED ORDER — DIPHENHYDRAMINE HCL 25 MG PO CAPS
25.0000 mg | ORAL_CAPSULE | Freq: Once | ORAL | Status: AC
  Administered 2015-12-25: 25 mg via ORAL

## 2015-12-25 NOTE — Telephone Encounter (Signed)
appt made and avs printed °

## 2015-12-25 NOTE — Progress Notes (Unsigned)
Patient was greeted in the chemo room with SystHers tablet for PROs. Patient asked that I log her in and at that time she completed the PROs.  Tablet was retrieved from patient and she was thanked for her time/study.  [name]

## 2015-12-25 NOTE — Patient Instructions (Signed)
[facility] Discharge Instructions for Patients Receiving Chemotherapy  Today you received the following chemotherapy agents Herceptin and Perjeta   To help prevent nausea and vomiting after your treatment, we encourage you to take your nausea medication as directed.    If you develop nausea and vomiting that is not controlled by your nausea medication, call the clinic.   BELOW ARE SYMPTOMS THAT SHOULD BE REPORTED IMMEDIATELY:  *FEVER GREATER THAN 100.5 F  *CHILLS WITH OR WITHOUT FEVER  NAUSEA AND VOMITING THAT IS NOT CONTROLLED WITH YOUR NAUSEA MEDICATION  *UNUSUAL SHORTNESS OF BREATH  *UNUSUAL BRUISING OR BLEEDING  TENDERNESS IN MOUTH AND THROAT WITH OR WITHOUT PRESENCE OF ULCERS  *URINARY PROBLEMS  *BOWEL PROBLEMS  UNUSUAL RASH Items with * indicate a potential emergency and should be followed up as soon as possible.  Feel free to call the clinic you have any questions or concerns. The clinic phone number is [phone].  Please show the CHEMO ALERT CARD at check-in to the Emergency Department and triage nurse.   
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
## 2015-12-26 LAB — CANCER ANTIGEN 27.29: CA 27.29: 18.2 U/mL (ref 0.0–38.6)

## 2015-12-26 LAB — CANCER ANTIGEN 27-29 (PARALLEL TESTING): CA 27.29: 20 U/mL (ref <38)

## 2015-12-31 ENCOUNTER — Telehealth: Payer: Self-pay | Admitting: *Deleted

## 2015-12-31 ENCOUNTER — Other Ambulatory Visit: Payer: Self-pay | Admitting: *Deleted

## 2015-12-31 DIAGNOSIS — C50912 Malignant neoplasm of unspecified site of left female breast: Secondary | ICD-10-CM

## 2015-12-31 DIAGNOSIS — C7802 Secondary malignant neoplasm of left lung: Principal | ICD-10-CM

## 2015-12-31 DIAGNOSIS — M48061 Spinal stenosis, lumbar region without neurogenic claudication: Secondary | ICD-10-CM

## 2015-12-31 DIAGNOSIS — C50212 Malignant neoplasm of upper-inner quadrant of left female breast: Secondary | ICD-10-CM

## 2015-12-31 DIAGNOSIS — M47813 Spondylosis without myelopathy or radiculopathy, cervicothoracic region: Secondary | ICD-10-CM

## 2015-12-31 MED ORDER — OXYCODONE HCL ER 10 MG PO T12A: 10.0000 mg | EXTENDED_RELEASE_TABLET | Freq: Two times a day (BID) | ORAL | Status: DC

## 2015-12-31 NOTE — Telephone Encounter (Signed)
Patient called requesting oxycontin refill.

## 2016-01-02 MED FILL — OxyCONTIN 10 MG T12A: 10 | 30 days supply | Qty: 60 | Fill #0

## 2016-01-08 ENCOUNTER — Other Ambulatory Visit: Payer: Self-pay | Admitting: Oncology

## 2016-01-09 ENCOUNTER — Encounter: Payer: Self-pay | Admitting: Family Medicine

## 2016-01-09 ENCOUNTER — Ambulatory Visit (INDEPENDENT_AMBULATORY_CARE_PROVIDER_SITE_OTHER): Payer: Medicare Other | Admitting: Family Medicine

## 2016-01-09 VITALS — BP 152/66 | HR 77 | Temp 98.4°F | Ht 64.0 in | Wt 306.0 lb

## 2016-01-09 DIAGNOSIS — B353 Tinea pedis: Secondary | ICD-10-CM

## 2016-01-09 DIAGNOSIS — I10 Essential (primary) hypertension: Secondary | ICD-10-CM

## 2016-01-09 DIAGNOSIS — E1149 Type 2 diabetes mellitus with other diabetic neurological complication: Secondary | ICD-10-CM

## 2016-01-09 MED ORDER — TERBINAFINE HCL 1 % EX CREA: 1.0000 "application " | TOPICAL_CREAM | Freq: Two times a day (BID) | CUTANEOUS | Status: DC

## 2016-01-09 NOTE — Progress Notes (Signed)
   Subjective:    Patient ID: [name], female    DOB: Jan 03, 1951, 65 y.o.   MRN: [id number]  HPI  DIABETES Disease Monitoring: Blood Sugar ranges(Severity) -Almost all fasting blood sugar last 10 days < 200.  Associated Symptoms- Polyuria/phagia/dipsia- no      Visual problems- no Medications: Compliance(Modifying factor) - is remembering to take lantus now that she is doing so in AM Hypoglycemic symptoms- no Timing - continuous  HYPERTENSION Disease Monitoring Home BP Monitoring (Severity) not checking Symptoms - Chest pain- no    Dyspnea- no Medications(Modifying factors) Compliance-  Did not remember to take her medications this AM since rushing to make it to appt. Lightheadedness-  no  Edema- mild unchanged Timing - continuous  Duration - years ROS - See HPI   Foot rash Bilateral scaly rash of both feet.  Not responding very well to hydrocortisone.   The rash on her arm is improving.  No hand rash  PMH Lab Review   POTASSIUM  Date Value Ref Range Status  12/25/2015 4.1 3.5 - 5.1 mEq/L Final  11/09/2015 3.4* 3.5 - 5.1 mmol/L Final   SODIUM  Date Value Ref Range Status  12/25/2015 140 136 - 145 mEq/L Final  11/09/2015 143 135 - 145 mmol/L Final   CREATININE  Date Value Ref Range Status  12/25/2015 0.8 0.6 - 1.1 mg/dL Final   CREAT  Date Value Ref Range Status  12/10/2011 0.77 0.50 - 1.10 mg/dL Final   CREATININE, SER  Date Value Ref Range Status  11/09/2015 0.55 0.44 - 1.00 mg/dL Final          Review of Systems     Objective:   Physical Exam  Alert interactive  Brought medications and readings  Feet -  bilater redness with large scale on sole and sides    Assessment & Plan:

## 2016-01-09 NOTE — Assessment & Plan Note (Signed)
BP Readings from Last 3 Encounters:  01/09/16 152/66  12/25/15 150/63  12/12/15 131/88   Not at goal likely due to not taking medications today.  Asked to take regularly

## 2016-01-09 NOTE — Assessment & Plan Note (Signed)
Improving by her readings.  Continue current regimen follow up in 2 months

## 2016-01-09 NOTE — Assessment & Plan Note (Signed)
I think her current scaling is due to this.  Will treat with lamisil prolonged course topically

## 2016-01-09 NOTE — Patient Instructions (Addendum)
Good to see you today!  Thanks for coming in.  Keep taking the insulin the way you are. Call if your fasting blood sugar are regularly > 200  Your blood pressure is high today. Take your medications every day.  Check your blood pressure 2-3 times a week and write down.  If usually > 140/90 then call me.  Bring in the readings next visit  Take your cpap machine when you see Dr Elsworth Soho  Use the lamisil cream twice a day on your feet  Ask your cancer doctor if you should be candidate for colonoscopy or pap smear   Come back in July

## 2016-01-15 ENCOUNTER — Other Ambulatory Visit: Payer: Self-pay | Admitting: *Deleted

## 2016-01-15 ENCOUNTER — Other Ambulatory Visit (HOSPITAL_BASED_OUTPATIENT_CLINIC_OR_DEPARTMENT_OTHER): Payer: Medicare Other

## 2016-01-15 ENCOUNTER — Ambulatory Visit (HOSPITAL_BASED_OUTPATIENT_CLINIC_OR_DEPARTMENT_OTHER): Payer: Medicare Other

## 2016-01-15 VITALS — BP 136/58 | HR 65 | Temp 98.2°F | Resp 18

## 2016-01-15 DIAGNOSIS — C7802 Secondary malignant neoplasm of left lung: Secondary | ICD-10-CM

## 2016-01-15 DIAGNOSIS — Z5112 Encounter for antineoplastic immunotherapy: Secondary | ICD-10-CM | POA: Diagnosis not present

## 2016-01-15 DIAGNOSIS — C50212 Malignant neoplasm of upper-inner quadrant of left female breast: Secondary | ICD-10-CM

## 2016-01-15 DIAGNOSIS — C50912 Malignant neoplasm of unspecified site of left female breast: Secondary | ICD-10-CM | POA: Diagnosis not present

## 2016-01-15 DIAGNOSIS — C7951 Secondary malignant neoplasm of bone: Secondary | ICD-10-CM | POA: Diagnosis not present

## 2016-01-15 LAB — COMPREHENSIVE METABOLIC PANEL
ALT: 97 U/L — ABNORMAL HIGH (ref 0–55)
AST: 46 U/L — ABNORMAL HIGH (ref 5–34)
Albumin: 3.2 g/dL — ABNORMAL LOW (ref 3.5–5.0)
Alkaline Phosphatase: 150 U/L (ref 40–150)
Anion Gap: 8 mEq/L (ref 3–11)
BUN: 16.2 mg/dL (ref 7.0–26.0)
CO2: 30 mEq/L — ABNORMAL HIGH (ref 22–29)
Calcium: 9.6 mg/dL (ref 8.4–10.4)
Chloride: 102 mEq/L (ref 98–109)
Creatinine: 0.8 mg/dL (ref 0.6–1.1)
EGFR: 75 mL/min/{1.73_m2} — ABNORMAL LOW (ref >90)
Glucose: 230 mg/dl — ABNORMAL HIGH (ref 70–140)
Potassium: 4 mEq/L (ref 3.5–5.1)
Sodium: 141 mEq/L (ref 136–145)
Total Bilirubin: 0.35 mg/dL (ref 0.20–1.20)
Total Protein: 7.1 g/dL (ref 6.4–8.3)

## 2016-01-15 LAB — CBC WITH DIFFERENTIAL/PLATELET
BASO%: 0.5 % (ref 0.0–2.0)
Basophils Absolute: 0 10*3/uL (ref 0.0–0.1)
EOS%: 1.7 % (ref 0.0–7.0)
Eosinophils Absolute: 0.1 10*3/uL (ref 0.0–0.5)
HCT: 42.2 % (ref 34.8–46.6)
HGB: 14 g/dL (ref 11.6–15.9)
LYMPH%: 31.4 % (ref 14.0–49.7)
MCH: 28.3 pg (ref 25.1–34.0)
MCHC: 33.2 g/dL (ref 31.5–36.0)
MCV: 85.3 fL (ref 79.5–101.0)
MONO#: 0.4 10*3/uL (ref 0.1–0.9)
MONO%: 6.5 % (ref 0.0–14.0)
NEUT#: 3.7 10*3/uL (ref 1.5–6.5)
NEUT%: 59.9 % (ref 38.4–76.8)
Platelets: 266 10*3/uL (ref 145–400)
RBC: 4.94 10*6/uL (ref 3.70–5.45)
RDW: 15.5 % — ABNORMAL HIGH (ref 11.2–14.5)
WBC: 6.2 10*3/uL (ref 3.9–10.3)
lymph#: 1.9 10*3/uL (ref 0.9–3.3)

## 2016-01-15 MED ORDER — DIPHENHYDRAMINE HCL 25 MG PO CAPS
25.0000 mg | ORAL_CAPSULE | Freq: Once | ORAL | Status: AC
  Administered 2016-01-15: 25 mg via ORAL

## 2016-01-15 MED ORDER — HEPARIN SOD (PORK) LOCK FLUSH 100 UNIT/ML IV SOLN
500.0000 [IU] | Freq: Once | INTRAVENOUS | Status: AC | PRN
  Administered 2016-01-15: 500 [IU]
  Filled 2016-01-15: qty 5

## 2016-01-15 MED ORDER — SODIUM CHLORIDE 0.9 % IV SOLN
Freq: Once | INTRAVENOUS | Status: AC
  Administered 2016-01-15: 10:00:00 via INTRAVENOUS

## 2016-01-15 MED ORDER — ACETAMINOPHEN 325 MG PO TABS
650.0000 mg | ORAL_TABLET | Freq: Once | ORAL | Status: AC
  Administered 2016-01-15: 650 mg via ORAL

## 2016-01-15 MED ORDER — ACETAMINOPHEN 325 MG PO TABS
ORAL_TABLET | ORAL | Status: AC
  Filled 2016-01-15: qty 2

## 2016-01-15 MED ORDER — TRASTUZUMAB CHEMO INJECTION 440 MG
6.0000 mg/kg | Freq: Once | INTRAVENOUS | Status: AC
  Administered 2016-01-15: 903 mg via INTRAVENOUS
  Filled 2016-01-15: qty 43

## 2016-01-15 MED ORDER — SODIUM CHLORIDE 0.9 % IV SOLN
420.0000 mg | Freq: Once | INTRAVENOUS | Status: AC
  Administered 2016-01-15: 420 mg via INTRAVENOUS
  Filled 2016-01-15: qty 14

## 2016-01-15 MED ORDER — DIPHENHYDRAMINE HCL 25 MG PO CAPS
ORAL_CAPSULE | ORAL | Status: AC
  Filled 2016-01-15: qty 1

## 2016-01-15 MED ORDER — LORAZEPAM 2 MG/ML IJ SOLN: 0.5000 mg | Freq: Once | INTRAMUSCULAR | Status: DC

## 2016-01-15 MED ORDER — SODIUM CHLORIDE 0.9 % IJ SOLN
10.0000 mL | INTRAMUSCULAR | Status: DC | PRN
  Administered 2016-01-15: 10 mL
  Filled 2016-01-15: qty 10

## 2016-01-15 NOTE — Patient Instructions (Signed)
[facility] Discharge Instructions for Patients Receiving Chemotherapy  Today you received the following chemotherapy agents Herceptin/Perjeta  To help prevent nausea and vomiting after your treatment, we encourage you to take your nausea medication    If you develop nausea and vomiting that is not controlled by your nausea medication, call the clinic.   BELOW ARE SYMPTOMS THAT SHOULD BE REPORTED IMMEDIATELY:  *FEVER GREATER THAN 100.5 F  *CHILLS WITH OR WITHOUT FEVER  NAUSEA AND VOMITING THAT IS NOT CONTROLLED WITH YOUR NAUSEA MEDICATION  *UNUSUAL SHORTNESS OF BREATH  *UNUSUAL BRUISING OR BLEEDING  TENDERNESS IN MOUTH AND THROAT WITH OR WITHOUT PRESENCE OF ULCERS  *URINARY PROBLEMS  *BOWEL PROBLEMS  UNUSUAL RASH Items with * indicate a potential emergency and should be followed up as soon as possible.  Feel free to call the clinic you have any questions or concerns. The clinic phone number is [phone].  Please show the CHEMO ALERT CARD at check-in to the Emergency Department and triage nurse.   

## 2016-01-17 ENCOUNTER — Other Ambulatory Visit: Payer: Self-pay | Admitting: Family Medicine

## 2016-01-18 ENCOUNTER — Other Ambulatory Visit: Payer: Self-pay | Admitting: Oncology

## 2016-01-24 ENCOUNTER — Encounter: Payer: Self-pay | Admitting: Pulmonary Disease

## 2016-01-24 ENCOUNTER — Ambulatory Visit (INDEPENDENT_AMBULATORY_CARE_PROVIDER_SITE_OTHER): Payer: Medicare Other | Admitting: Pulmonary Disease

## 2016-01-24 VITALS — BP 138/70 | HR 66 | Ht 64.0 in | Wt 309.2 lb

## 2016-01-24 DIAGNOSIS — G4730 Sleep apnea, unspecified: Secondary | ICD-10-CM | POA: Diagnosis not present

## 2016-01-24 NOTE — Assessment & Plan Note (Signed)
She had very mild OSA on her sleep study in 02/2012. It is certainly possible that she has developed worsening OSA due to her 75 pound weight gain in the last few years. 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

## 2016-01-24 NOTE — Progress Notes (Signed)
Subjective:    Patient ID: [name], female    DOB: 1951-04-04, 65 y.o.   MRN: [id number]  HPI  Chief Complaint  Patient presents with  . SLEEP CONSULT    Referred by Gentry Fitz. Uses 2 Liters O2 at night. Not using CPAP machine d/t being a mouth breather and having a lot of air forced down her throat . Epworth Score: 5   65 year old morbidly obese woman referred for evaluation of sleep-disordered breathing. She was admitted 10/2015 with acute hypercarbic respiratory failure requiring BiPAP and a Klebsiella UTI. She was discharged on nasal CPAP and nocturnal oxygen-she was only able to use CPAP for about a week but did not tolerate the mask and the pressure. She feels that she does not need oxygen and hence is not using this unless her heart is racing. She has metastatic breast cancer, left upper extremity DVT in 2016 and short-term memory loss. She has chronic lumbar disc pain requiring oxycodone every 12 hours. Review of her arterial blood gas shows a discharge blood gas of 7.42/60/79 and an acute admission ABG showing PCO2 of 90. She had a similar admission for acute respiratory acidosis in 09/2014 with 7.29/75.  PSG 02/2012 shows RDI of 7/hour with her weight being 325 pounds and lowest desaturation of 76%  Epworth sleepiness score is 0 but I feel that she is underreporting She does not have a fixed bedtime, sleep latency is prolonged, she sleeps with 2-3 pillows somewhat propped up, reports 4-5 nocturnal spontaneous awakenings and is out of bed by 6:30 AM feeling tired with occasional dryness of mouth. She's gained 75 pounds over the last 2 years  There is no history suggestive of cataplexy, sleep paralysis or parasomnias  She is a remote smoker and smoked less than 15 pack years before she quit in 1970    Past Medical History  Diagnosis Date  . Hypertension   . Other abnormal glucose   . Obesity, unspecified   . Unspecified sleep apnea   . Syncope and collapse   . Chest  pain   . Suicide attempt ([place]) 1996  . Fatty liver 6/03  . Lung disease   . Arthritis   . Back pain   . Diabetes mellitus without complication ([place]) 10/13/3333  . Breast cancer ([place]) dx'd 11/2012    left   Past Surgical History  Procedure Laterality Date  . Cardiac catheterization      2007  . Cholecystectomy    . Tubal ligation       Allergies  Allergen Reactions  . Meperidine Hcl Anaphylaxis  . Penicillins Anaphylaxis    Has patient had a PCN reaction causing immediate rash, facial/tongue/throat swelling, SOB or lightheadedness with hypotension: yes Has patient had a PCN reaction causing severe rash involving mucus membranes or skin necrosis: no Has patient had a PCN reaction that required hospitalization yes Has patient had a PCN reaction occurring within the last 10 years: no If all of the above answers are "NO", then may proceed with Cephalosporin use.   Marland Kitchen Amoxicillin     REACTION: unspecified  . Aspirin Nausea And Vomiting    REACTION: unspecified  . Percocet [Oxycodone-Acetaminophen]      Social History   Social History  . Marital Status: Divorced    Spouse Name: N/A  . Number of Children: 4  . Years of Education: some colle   Occupational History  . Disability   . retired-daycare    Social History Main Topics  .  Smoking status: Former Smoker -- 3.00 packs/day for 5 years    Types: Cigarettes    Quit date: 08/18/1968  . Smokeless tobacco: Never Used  . Alcohol Use: No  . Drug Use: No  . Sexual Activity: Not Currently   Other Topics Concern  . Not on file   Social History Narrative   Health Care POA:    Emergency Contact: Thomasenia Bottoms [phone]   End of Life Plan:    Who lives with you: two grandchildren, friend, daughter   Any pets: 3 poodles   Diet: Pt has a variety of protein, starch, vegetables.  Pt is currently working on cutting back on portions for weight loss.   Exercise: Pt has not regular exercise routine.  Occasionally walks around  home.   Seatbelts: Pt reports wearing seatbelt occasionally.   Sun Exposure/Protection: Pt does not use sun protection   Hobbies: reading, playing on kindle, [employer]              Family History  Problem Relation Age of Onset  . Coronary artery disease Father 47  . Diabetes Father   . Heart disease Father   . Breast cancer Mother 8  . Cancer Mother 31    breast  . Coronary artery disease Sister 66  . Coronary artery disease Brother 2  . Cancer Maternal Aunt 40    ovarian  . Cancer Maternal Grandmother 55    ovarian  . Cancer Paternal Aunt 29    ovarian/breast/breast      Review of Systems  Constitutional: Negative for fever and unexpected weight change.  HENT: Negative for congestion, dental problem, ear pain, nosebleeds, postnasal drip, rhinorrhea, sinus pressure, sneezing, sore throat and trouble swallowing.   Eyes: Negative for redness and itching.  Respiratory: Positive for shortness of breath. Negative for cough, chest tightness and wheezing.   Cardiovascular: Positive for leg swelling. Negative for palpitations.  Gastrointestinal: Negative for nausea and vomiting.  Genitourinary: Negative for dysuria.  Musculoskeletal: Positive for joint swelling.  Skin: Negative for rash.  Neurological: Negative for headaches.  Hematological: Does not bruise/bleed easily.  Psychiatric/Behavioral: Negative for dysphoric mood. The patient is not nervous/anxious.        Objective:   Physical Exam  Gen. Pleasant, obese, in no distress, anxious affect, in wheelchair ENT - no lesions, no post nasal drip, class 2-3 airway Neck: No JVD, no thyromegaly, no carotid bruits Lungs: no use of accessory muscles, no dullness to percussion, decreased without rales or rhonchi  Cardiovascular: Rhythm regular, heart sounds  normal, no murmurs or gallops, no peripheral edema Abdomen: soft and non-tender, no hepatosplenomegaly, BS normal. Musculoskeletal: No deformities, no cyanosis or  clubbing Neuro:  alert, non focal, no tremors       Assessment & Plan:

## 2016-01-24 NOTE — Patient Instructions (Signed)
Schedule sleep study-based on this we will advise you about CPAP use and need for oxygen

## 2016-02-04 ENCOUNTER — Other Ambulatory Visit: Payer: Self-pay

## 2016-02-04 ENCOUNTER — Other Ambulatory Visit: Payer: Self-pay | Admitting: Family Medicine

## 2016-02-04 DIAGNOSIS — C7802 Secondary malignant neoplasm of left lung: Principal | ICD-10-CM

## 2016-02-04 DIAGNOSIS — C50212 Malignant neoplasm of upper-inner quadrant of left female breast: Secondary | ICD-10-CM

## 2016-02-04 DIAGNOSIS — C50912 Malignant neoplasm of unspecified site of left female breast: Secondary | ICD-10-CM

## 2016-02-04 DIAGNOSIS — M48061 Spinal stenosis, lumbar region without neurogenic claudication: Secondary | ICD-10-CM

## 2016-02-04 DIAGNOSIS — M47813 Spondylosis without myelopathy or radiculopathy, cervicothoracic region: Secondary | ICD-10-CM

## 2016-02-04 MED ORDER — OXYCODONE HCL ER 10 MG PO T12A: 10.0000 mg | EXTENDED_RELEASE_TABLET | Freq: Two times a day (BID) | ORAL | Status: DC

## 2016-02-05 ENCOUNTER — Other Ambulatory Visit (HOSPITAL_BASED_OUTPATIENT_CLINIC_OR_DEPARTMENT_OTHER): Payer: Medicare Other

## 2016-02-05 ENCOUNTER — Ambulatory Visit (HOSPITAL_BASED_OUTPATIENT_CLINIC_OR_DEPARTMENT_OTHER): Payer: Medicare Other

## 2016-02-05 ENCOUNTER — Other Ambulatory Visit: Payer: Self-pay

## 2016-02-05 VITALS — BP 136/72 | HR 67 | Temp 98.7°F | Resp 19

## 2016-02-05 DIAGNOSIS — C7802 Secondary malignant neoplasm of left lung: Secondary | ICD-10-CM

## 2016-02-05 DIAGNOSIS — C50912 Malignant neoplasm of unspecified site of left female breast: Secondary | ICD-10-CM

## 2016-02-05 DIAGNOSIS — C7951 Secondary malignant neoplasm of bone: Secondary | ICD-10-CM

## 2016-02-05 DIAGNOSIS — C50212 Malignant neoplasm of upper-inner quadrant of left female breast: Secondary | ICD-10-CM

## 2016-02-05 DIAGNOSIS — Z5112 Encounter for antineoplastic immunotherapy: Secondary | ICD-10-CM

## 2016-02-05 LAB — CBC WITH DIFFERENTIAL/PLATELET
BASO%: 0.2 % (ref 0.0–2.0)
Basophils Absolute: 0 10*3/uL (ref 0.0–0.1)
EOS%: 1.4 % (ref 0.0–7.0)
Eosinophils Absolute: 0.1 10*3/uL (ref 0.0–0.5)
HCT: 43.1 % (ref 34.8–46.6)
HGB: 14.5 g/dL (ref 11.6–15.9)
LYMPH%: 26 % (ref 14.0–49.7)
MCH: 28.9 pg (ref 25.1–34.0)
MCHC: 33.6 g/dL (ref 31.5–36.0)
MCV: 86 fL (ref 79.5–101.0)
MONO#: 0.5 10*3/uL (ref 0.1–0.9)
MONO%: 5.9 % (ref 0.0–14.0)
NEUT#: 5.6 10*3/uL (ref 1.5–6.5)
NEUT%: 66.5 % (ref 38.4–76.8)
Platelets: 262 10*3/uL (ref 145–400)
RBC: 5.01 10*6/uL (ref 3.70–5.45)
RDW: 14 % (ref 11.2–14.5)
WBC: 8.4 10*3/uL (ref 3.9–10.3)
lymph#: 2.2 10*3/uL (ref 0.9–3.3)

## 2016-02-05 LAB — COMPREHENSIVE METABOLIC PANEL
ALT: 82 U/L — ABNORMAL HIGH (ref 0–55)
AST: 43 U/L — ABNORMAL HIGH (ref 5–34)
Albumin: 3.2 g/dL — ABNORMAL LOW (ref 3.5–5.0)
Alkaline Phosphatase: 128 U/L (ref 40–150)
Anion Gap: 10 mEq/L (ref 3–11)
BUN: 21 mg/dL (ref 7.0–26.0)
CO2: 27 mEq/L (ref 22–29)
Calcium: 9.7 mg/dL (ref 8.4–10.4)
Chloride: 103 mEq/L (ref 98–109)
Creatinine: 0.9 mg/dL (ref 0.6–1.1)
EGFR: 70 mL/min/{1.73_m2} — ABNORMAL LOW (ref >90)
Glucose: 284 mg/dl — ABNORMAL HIGH (ref 70–140)
Potassium: 4 mEq/L (ref 3.5–5.1)
Sodium: 140 mEq/L (ref 136–145)
Total Bilirubin: 0.3 mg/dL (ref 0.20–1.20)
Total Protein: 7.7 g/dL (ref 6.4–8.3)

## 2016-02-05 MED ORDER — DIPHENHYDRAMINE HCL 25 MG PO CAPS
25.0000 mg | ORAL_CAPSULE | Freq: Once | ORAL | Status: AC
  Administered 2016-02-05: 25 mg via ORAL

## 2016-02-05 MED ORDER — DIPHENHYDRAMINE HCL 25 MG PO CAPS
ORAL_CAPSULE | ORAL | Status: AC
  Filled 2016-02-05: qty 1

## 2016-02-05 MED ORDER — HEPARIN SOD (PORK) LOCK FLUSH 100 UNIT/ML IV SOLN
500.0000 [IU] | Freq: Once | INTRAVENOUS | Status: AC | PRN
  Administered 2016-02-05: 500 [IU]
  Filled 2016-02-05: qty 5

## 2016-02-05 MED ORDER — SODIUM CHLORIDE 0.9 % IV SOLN
6.0000 mg/kg | Freq: Once | INTRAVENOUS | Status: AC
  Administered 2016-02-05: 903 mg via INTRAVENOUS
  Filled 2016-02-05: qty 43

## 2016-02-05 MED ORDER — ACETAMINOPHEN 325 MG PO TABS
ORAL_TABLET | ORAL | Status: AC
  Filled 2016-02-05: qty 2

## 2016-02-05 MED ORDER — SODIUM CHLORIDE 0.9 % IV SOLN
Freq: Once | INTRAVENOUS | Status: AC
  Administered 2016-02-05: 12:00:00 via INTRAVENOUS

## 2016-02-05 MED ORDER — PERTUZUMAB CHEMO INJECTION 420 MG/14ML
420.0000 mg | Freq: Once | INTRAVENOUS | Status: AC
  Administered 2016-02-05: 420 mg via INTRAVENOUS
  Filled 2016-02-05: qty 14

## 2016-02-05 MED ORDER — ACETAMINOPHEN 325 MG PO TABS
650.0000 mg | ORAL_TABLET | Freq: Once | ORAL | Status: AC
  Administered 2016-02-05: 650 mg via ORAL

## 2016-02-05 MED ORDER — SODIUM CHLORIDE 0.9 % IJ SOLN
10.0000 mL | INTRAMUSCULAR | Status: DC | PRN
  Administered 2016-02-05: 10 mL
  Filled 2016-02-05: qty 10

## 2016-02-05 MED ORDER — LORAZEPAM 2 MG/ML IJ SOLN
INTRAMUSCULAR | Status: AC
  Filled 2016-02-05: qty 1

## 2016-02-05 MED ORDER — LORAZEPAM 2 MG/ML IJ SOLN: 0.5000 mg | Freq: Once | INTRAMUSCULAR | Status: DC

## 2016-02-05 NOTE — Progress Notes (Signed)
Pt here for herceptin/perjeta treatment.  Last ECHO 11/29/15.  Pt denies having next ECHO scheduled.  Collaborative desk RN notified of need for pt to have 3 month follow up ECHO scheduled.

## 2016-02-05 NOTE — Patient Instructions (Signed)
[facility] Discharge Instructions for Patients Receiving Chemotherapy  Today you received the following chemotherapy agents Herceptin/Perjeta.  To help prevent nausea and vomiting after your treatment, we encourage you to take your nausea medication as directed.    If you develop nausea and vomiting that is not controlled by your nausea medication, call the clinic.   BELOW ARE SYMPTOMS THAT SHOULD BE REPORTED IMMEDIATELY:  *FEVER GREATER THAN 100.5 F  *CHILLS WITH OR WITHOUT FEVER  NAUSEA AND VOMITING THAT IS NOT CONTROLLED WITH YOUR NAUSEA MEDICATION  *UNUSUAL SHORTNESS OF BREATH  *UNUSUAL BRUISING OR BLEEDING  TENDERNESS IN MOUTH AND THROAT WITH OR WITHOUT PRESENCE OF ULCERS  *URINARY PROBLEMS  *BOWEL PROBLEMS  UNUSUAL RASH Items with * indicate a potential emergency and should be followed up as soon as possible.  Feel free to call the clinic you have any questions or concerns. The clinic phone number is [phone].  Please show the CHEMO ALERT CARD at check-in to the Emergency Department and triage nurse.   

## 2016-02-21 ENCOUNTER — Ambulatory Visit (HOSPITAL_BASED_OUTPATIENT_CLINIC_OR_DEPARTMENT_OTHER): Payer: Medicare Other | Attending: Pulmonary Disease | Admitting: Pulmonary Disease

## 2016-02-21 DIAGNOSIS — G4730 Sleep apnea, unspecified: Secondary | ICD-10-CM | POA: Insufficient documentation

## 2016-02-21 DIAGNOSIS — G4736 Sleep related hypoventilation in conditions classified elsewhere: Secondary | ICD-10-CM | POA: Insufficient documentation

## 2016-02-21 DIAGNOSIS — G4733 Obstructive sleep apnea (adult) (pediatric): Secondary | ICD-10-CM | POA: Diagnosis present

## 2016-02-21 DIAGNOSIS — Z79899 Other long term (current) drug therapy: Secondary | ICD-10-CM | POA: Insufficient documentation

## 2016-02-26 ENCOUNTER — Ambulatory Visit: Payer: Medicare Other | Admitting: Oncology

## 2016-02-26 ENCOUNTER — Other Ambulatory Visit: Payer: Self-pay | Admitting: *Deleted

## 2016-02-26 ENCOUNTER — Other Ambulatory Visit (HOSPITAL_BASED_OUTPATIENT_CLINIC_OR_DEPARTMENT_OTHER): Payer: Medicare Other

## 2016-02-26 ENCOUNTER — Ambulatory Visit (HOSPITAL_BASED_OUTPATIENT_CLINIC_OR_DEPARTMENT_OTHER): Payer: Medicare Other

## 2016-02-26 VITALS — BP 133/63 | HR 72 | Temp 98.8°F | Resp 19

## 2016-02-26 DIAGNOSIS — C7802 Secondary malignant neoplasm of left lung: Secondary | ICD-10-CM

## 2016-02-26 DIAGNOSIS — C50212 Malignant neoplasm of upper-inner quadrant of left female breast: Secondary | ICD-10-CM

## 2016-02-26 DIAGNOSIS — C7951 Secondary malignant neoplasm of bone: Secondary | ICD-10-CM

## 2016-02-26 DIAGNOSIS — Z79899 Other long term (current) drug therapy: Secondary | ICD-10-CM

## 2016-02-26 DIAGNOSIS — C7800 Secondary malignant neoplasm of unspecified lung: Secondary | ICD-10-CM

## 2016-02-26 DIAGNOSIS — Z5112 Encounter for antineoplastic immunotherapy: Secondary | ICD-10-CM | POA: Diagnosis not present

## 2016-02-26 DIAGNOSIS — C50912 Malignant neoplasm of unspecified site of left female breast: Secondary | ICD-10-CM | POA: Diagnosis not present

## 2016-02-26 DIAGNOSIS — Z5181 Encounter for therapeutic drug level monitoring: Secondary | ICD-10-CM

## 2016-02-26 DIAGNOSIS — C50919 Malignant neoplasm of unspecified site of unspecified female breast: Secondary | ICD-10-CM

## 2016-02-26 LAB — COMPREHENSIVE METABOLIC PANEL
ALT: 73 U/L — ABNORMAL HIGH (ref 0–55)
AST: 45 U/L — ABNORMAL HIGH (ref 5–34)
Albumin: 3.3 g/dL — ABNORMAL LOW (ref 3.5–5.0)
Alkaline Phosphatase: 145 U/L (ref 40–150)
Anion Gap: 10 mEq/L (ref 3–11)
BUN: 18.5 mg/dL (ref 7.0–26.0)
CO2: 26 mEq/L (ref 22–29)
Calcium: 10 mg/dL (ref 8.4–10.4)
Chloride: 105 mEq/L (ref 98–109)
Creatinine: 0.8 mg/dL (ref 0.6–1.1)
EGFR: 78 mL/min/{1.73_m2} — ABNORMAL LOW (ref >90)
Glucose: 144 mg/dl — ABNORMAL HIGH (ref 70–140)
Potassium: 3.9 mEq/L (ref 3.5–5.1)
Sodium: 141 mEq/L (ref 136–145)
Total Bilirubin: <0.3 mg/dL (ref 0.20–1.20)
Total Protein: 7.5 g/dL (ref 6.4–8.3)

## 2016-02-26 LAB — CBC WITH DIFFERENTIAL/PLATELET
BASO%: 0.1 % (ref 0.0–2.0)
Basophils Absolute: 0 10*3/uL (ref 0.0–0.1)
EOS%: 1 % (ref 0.0–7.0)
Eosinophils Absolute: 0.1 10*3/uL (ref 0.0–0.5)
HCT: 42.5 % (ref 34.8–46.6)
HGB: 14.4 g/dL (ref 11.6–15.9)
LYMPH%: 30.8 % (ref 14.0–49.7)
MCH: 28.9 pg (ref 25.1–34.0)
MCHC: 33.9 g/dL (ref 31.5–36.0)
MCV: 85.2 fL (ref 79.5–101.0)
MONO#: 0.5 10*3/uL (ref 0.1–0.9)
MONO%: 6 % (ref 0.0–14.0)
NEUT#: 4.8 10*3/uL (ref 1.5–6.5)
NEUT%: 62.1 % (ref 38.4–76.8)
Platelets: 262 10*3/uL (ref 145–400)
RBC: 4.99 10*6/uL (ref 3.70–5.45)
RDW: 13.6 % (ref 11.2–14.5)
WBC: 7.7 10*3/uL (ref 3.9–10.3)
lymph#: 2.4 10*3/uL (ref 0.9–3.3)

## 2016-02-26 MED ORDER — HEPARIN SOD (PORK) LOCK FLUSH 100 UNIT/ML IV SOLN
500.0000 [IU] | Freq: Once | INTRAVENOUS | Status: AC | PRN
  Administered 2016-02-26: 500 [IU]
  Filled 2016-02-26: qty 5

## 2016-02-26 MED ORDER — ACETAMINOPHEN 325 MG PO TABS
ORAL_TABLET | ORAL | Status: AC
  Filled 2016-02-26: qty 2

## 2016-02-26 MED ORDER — SODIUM CHLORIDE 0.9 % IV SOLN
Freq: Once | INTRAVENOUS | Status: AC
  Administered 2016-02-26: 14:00:00 via INTRAVENOUS

## 2016-02-26 MED ORDER — SODIUM CHLORIDE 0.9 % IV SOLN
420.0000 mg | Freq: Once | INTRAVENOUS | Status: AC
  Administered 2016-02-26: 420 mg via INTRAVENOUS
  Filled 2016-02-26: qty 14

## 2016-02-26 MED ORDER — DIPHENHYDRAMINE HCL 25 MG PO CAPS
ORAL_CAPSULE | ORAL | Status: AC
  Filled 2016-02-26: qty 1

## 2016-02-26 MED ORDER — ACETAMINOPHEN 325 MG PO TABS
650.0000 mg | ORAL_TABLET | Freq: Once | ORAL | Status: AC
  Administered 2016-02-26: 650 mg via ORAL

## 2016-02-26 MED ORDER — DIPHENHYDRAMINE HCL 25 MG PO CAPS
25.0000 mg | ORAL_CAPSULE | Freq: Once | ORAL | Status: AC
  Administered 2016-02-26: 25 mg via ORAL

## 2016-02-26 MED ORDER — TRASTUZUMAB CHEMO 150 MG IV SOLR
6.0000 mg/kg | Freq: Once | INTRAVENOUS | Status: AC
  Administered 2016-02-26: 903 mg via INTRAVENOUS
  Filled 2016-02-26: qty 43

## 2016-02-26 MED ORDER — SODIUM CHLORIDE 0.9 % IJ SOLN
10.0000 mL | INTRAMUSCULAR | Status: DC | PRN
  Administered 2016-02-26: 10 mL
  Filled 2016-02-26: qty 10

## 2016-02-26 NOTE — Patient Instructions (Signed)
[facility] Discharge Instructions for Patients Receiving Chemotherapy  Today you received the following chemotherapy agents Herceptin and Perjeta   To help prevent nausea and vomiting after your treatment, we encourage you to take your nausea medication as directed.    If you develop nausea and vomiting that is not controlled by your nausea medication, call the clinic.   BELOW ARE SYMPTOMS THAT SHOULD BE REPORTED IMMEDIATELY:  *FEVER GREATER THAN 100.5 F  *CHILLS WITH OR WITHOUT FEVER  NAUSEA AND VOMITING THAT IS NOT CONTROLLED WITH YOUR NAUSEA MEDICATION  *UNUSUAL SHORTNESS OF BREATH  *UNUSUAL BRUISING OR BLEEDING  TENDERNESS IN MOUTH AND THROAT WITH OR WITHOUT PRESENCE OF ULCERS  *URINARY PROBLEMS  *BOWEL PROBLEMS  UNUSUAL RASH Items with * indicate a potential emergency and should be followed up as soon as possible.  Feel free to call the clinic you have any questions or concerns. The clinic phone number is [phone].  Please show the CHEMO ALERT CARD at check-in to the Emergency Department and triage nurse.   

## 2016-02-26 NOTE — Progress Notes (Signed)
Dr. Jana Hakim made awarethatlast cardiac echo was in March 2017. OK to treat. Val, RN to schedule.  Pt aware.

## 2016-02-27 LAB — CANCER ANTIGEN 27.29: CA 27.29: 23.5 U/mL (ref 0.0–38.6)

## 2016-03-04 DIAGNOSIS — G4730 Sleep apnea, unspecified: Secondary | ICD-10-CM | POA: Diagnosis not present

## 2016-03-04 NOTE — Procedures (Signed)
Patient Name: Jenna, Moss Date: 02/21/2016 Gender: Female D.O.B: [birth year] Age (years): 15 Referring Provider: Kara Mead MD, ABSM Height (inches): 65 Interpreting Physician: Kara Mead MD, ABSM Weight (lbs): 309 RPSGT: Gerhard Perches BMI: 51 MRN: [id number] Neck Size: 16.00   CLINICAL INFORMATION Sleep Study Type: NPSG Indication for sleep study: OSA Epworth Sleepiness Score:8   SLEEP STUDY TECHNIQUE As per the AASM Manual for the Scoring of Sleep and Associated Events v2.3 (April 2016) with a hypopnea requiring 4% desaturations. The channels recorded and monitored were frontal, central and occipital EEG, electrooculogram (EOG), submentalis EMG (chin), nasal and oral airflow, thoracic and abdominal wall motion, anterior tibialis EMG, snore microphone, electrocardiogram, and pulse oximetry.   MEDICATIONS Patient's medications include: BENADRYL. Medications self-administered by patient during sleep study : No sleep medicine administered.   SLEEP ARCHITECTURE The study was initiated at 11:00:11 PM and ended at 5:11:42 AM. Sleep onset time was 7.9 minutes and the sleep efficiency was 88.1%. The total sleep time was 327.1 minutes. Stage REM latency was 148.0 minutes. The patient spent 4.32% of the night in stage N1 sleep, 54.56% in stage N2 sleep, 28.12% in stage N3 and 12.99% in REM. Alpha intrusion was absent. Supine sleep was 100.00%.   RESPIRATORY PARAMETERS The overall apnea/hypopnea index (AHI) was 1.5 per hour. There were 1 total apneas, including 1 obstructive, 0 central and 0 mixed apneas. There were 7 hypopneas and 22 RERAs. The AHI during Stage REM sleep was 9.9 per hour. AHI while supine was 1.5 per hour. The mean oxygen saturation was 89.85%. The minimum SpO2 during sleep was 67.00%. snoring was noted during this study.   CARDIAC DATA The 2 lead EKG demonstrated sinus rhythm. The mean heart rate was 66.74 beats per minute.  Other EKG findings  include: None.   LEG MOVEMENT DATA The total PLMS were 0 with a resulting PLMS index of 0.00. Associated arousal with leg movement index was 0.0 .   IMPRESSIONS - No significant obstructive sleep apnea occurred during this study (AHI = 1.5/h). - No significant central sleep apnea occurred during this study (CAI = 0.0/h). - Severe oxygen desaturation was noted during this study especially during REM sleep (Min O2 = 67.00%). - No snoring was audible during this study. - No cardiac abnormalities were noted during this study. - Clinically significant periodic limb movements did not occur during sleep. No significant associated arousals. - Patient slept in a recliner on a pillow for the recording of the study. She states she cannot sleep in a bed and has not slept in anything but a recliner for the last 10 years   DIAGNOSIS - Nocturnal Hypoxemia (327.26 [G47.36 ICD-10])   RECOMMENDATIONS - Avoid alcohol, sedatives and other CNS depressants that may worsen sleep apnea and disrupt normal sleep architecture. - Sleep hygiene should be reviewed to assess factors that may improve sleep quality. - Weight management and regular exercise should be initiated or continued if appropriate. - Consider nocturnal oxygen during sleep and recheck oximetry to ensure correction    Kara Mead MD. FCCP. [facility]   03/04/2016

## 2016-03-05 ENCOUNTER — Telehealth: Payer: Self-pay | Admitting: *Deleted

## 2016-03-05 DIAGNOSIS — R0902 Hypoxemia: Secondary | ICD-10-CM

## 2016-03-05 NOTE — Telephone Encounter (Signed)
-----   Message from Rigoberto Noel, MD sent at 03/05/2016  2:35 PM EDT ----- She did not have OSA Needs to use O2 during sleep  and recheck oximetry on o2 to ensure correction   ----- Message -----    From: Glean Hess, CMA    Sent: 03/04/2016   5:15 PM      To: Rigoberto Noel, MD  Attempted to schedule appointment with patient, she wanted to know why she needs to come in to find out results, she wanted to know if she can just get the results over the phone because it is hard for her to get a ride during the week.  ----- Message -----    From: Rigoberto Noel, MD    Sent: 03/04/2016   3:35 PM      To: Glean Hess, [place]  Needs office visit with T p/me to discuss sleep study

## 2016-03-05 NOTE — Telephone Encounter (Signed)
Patient notified of results. ONO ordered. Nothing further needed.
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
## 2016-03-24 ENCOUNTER — Ambulatory Visit (HOSPITAL_BASED_OUTPATIENT_CLINIC_OR_DEPARTMENT_OTHER): Payer: Medicare Other | Admitting: Oncology

## 2016-03-24 ENCOUNTER — Encounter: Payer: Self-pay | Admitting: *Deleted

## 2016-03-24 ENCOUNTER — Telehealth: Payer: Self-pay | Admitting: Oncology

## 2016-03-24 ENCOUNTER — Other Ambulatory Visit: Payer: Self-pay | Admitting: Family Medicine

## 2016-03-24 ENCOUNTER — Other Ambulatory Visit (HOSPITAL_BASED_OUTPATIENT_CLINIC_OR_DEPARTMENT_OTHER): Payer: Medicare Other

## 2016-03-24 ENCOUNTER — Ambulatory Visit (HOSPITAL_BASED_OUTPATIENT_CLINIC_OR_DEPARTMENT_OTHER): Payer: Medicare Other

## 2016-03-24 VITALS — BP 115/69 | HR 63 | Temp 98.3°F | Resp 18

## 2016-03-24 VITALS — BP 146/69 | HR 66 | Temp 98.4°F | Resp 18 | Ht 65.0 in | Wt 303.5 lb

## 2016-03-24 DIAGNOSIS — C7802 Secondary malignant neoplasm of left lung: Principal | ICD-10-CM

## 2016-03-24 DIAGNOSIS — M47813 Spondylosis without myelopathy or radiculopathy, cervicothoracic region: Secondary | ICD-10-CM

## 2016-03-24 DIAGNOSIS — Z5112 Encounter for antineoplastic immunotherapy: Secondary | ICD-10-CM

## 2016-03-24 DIAGNOSIS — C50912 Malignant neoplasm of unspecified site of left female breast: Secondary | ICD-10-CM

## 2016-03-24 DIAGNOSIS — Z006 Encounter for examination for normal comparison and control in clinical research program: Secondary | ICD-10-CM

## 2016-03-24 DIAGNOSIS — C50212 Malignant neoplasm of upper-inner quadrant of left female breast: Secondary | ICD-10-CM

## 2016-03-24 DIAGNOSIS — M48061 Spinal stenosis, lumbar region without neurogenic claudication: Secondary | ICD-10-CM

## 2016-03-24 DIAGNOSIS — Z79811 Long term (current) use of aromatase inhibitors: Secondary | ICD-10-CM | POA: Diagnosis not present

## 2016-03-24 LAB — COMPREHENSIVE METABOLIC PANEL
ALT: 58 U/L — ABNORMAL HIGH (ref 0–55)
AST: 28 U/L (ref 5–34)
Albumin: 3.2 g/dL — ABNORMAL LOW (ref 3.5–5.0)
Alkaline Phosphatase: 142 U/L (ref 40–150)
Anion Gap: 11 mEq/L (ref 3–11)
BUN: 18.3 mg/dL (ref 7.0–26.0)
CO2: 26 mEq/L (ref 22–29)
Calcium: 9.9 mg/dL (ref 8.4–10.4)
Chloride: 104 mEq/L (ref 98–109)
Creatinine: 0.8 mg/dL (ref 0.6–1.1)
EGFR: 74 mL/min/{1.73_m2} — ABNORMAL LOW (ref >90)
Glucose: 277 mg/dl — ABNORMAL HIGH (ref 70–140)
Potassium: 4 mEq/L (ref 3.5–5.1)
Sodium: 141 mEq/L (ref 136–145)
Total Bilirubin: 0.35 mg/dL (ref 0.20–1.20)
Total Protein: 7.4 g/dL (ref 6.4–8.3)

## 2016-03-24 LAB — CBC WITH DIFFERENTIAL/PLATELET
BASO%: 0.3 % (ref 0.0–2.0)
Basophils Absolute: 0 10*3/uL (ref 0.0–0.1)
EOS%: 1.9 % (ref 0.0–7.0)
Eosinophils Absolute: 0.1 10*3/uL (ref 0.0–0.5)
HCT: 42.2 % (ref 34.8–46.6)
HGB: 14.1 g/dL (ref 11.6–15.9)
LYMPH%: 35.8 % (ref 14.0–49.7)
MCH: 28.4 pg (ref 25.1–34.0)
MCHC: 33.4 g/dL (ref 31.5–36.0)
MCV: 85.1 fL (ref 79.5–101.0)
MONO#: 0.3 10*3/uL (ref 0.1–0.9)
MONO%: 4.8 % (ref 0.0–14.0)
NEUT#: 3.7 10*3/uL (ref 1.5–6.5)
NEUT%: 57.2 % (ref 38.4–76.8)
Platelets: 250 10*3/uL (ref 145–400)
RBC: 4.96 10*6/uL (ref 3.70–5.45)
RDW: 13.7 % (ref 11.2–14.5)
WBC: 6.4 10*3/uL (ref 3.9–10.3)
lymph#: 2.3 10*3/uL (ref 0.9–3.3)

## 2016-03-24 MED ORDER — SODIUM CHLORIDE 0.9 % IJ SOLN
10.0000 mL | INTRAMUSCULAR | Status: DC | PRN
  Administered 2016-03-24: 10 mL
  Filled 2016-03-24: qty 10

## 2016-03-24 MED ORDER — DIPHENHYDRAMINE HCL 25 MG PO CAPS
ORAL_CAPSULE | ORAL | Status: AC
Start: 2016-03-24 — End: 2016-03-24
  Filled 2016-03-24: qty 1

## 2016-03-24 MED ORDER — TRASTUZUMAB CHEMO 150 MG IV SOLR
6.0000 mg/kg | Freq: Once | INTRAVENOUS | Status: AC
  Administered 2016-03-24: 903 mg via INTRAVENOUS
  Filled 2016-03-24: qty 43

## 2016-03-24 MED ORDER — SODIUM CHLORIDE 0.9 % IV SOLN
Freq: Once | INTRAVENOUS | Status: AC
  Administered 2016-03-24: 13:00:00 via INTRAVENOUS

## 2016-03-24 MED ORDER — OXYCODONE HCL ER 10 MG PO T12A: 10.0000 mg | EXTENDED_RELEASE_TABLET | Freq: Two times a day (BID) | ORAL | 0 refills | Status: DC

## 2016-03-24 MED ORDER — SODIUM CHLORIDE 0.9 % IV SOLN
420.0000 mg | Freq: Once | INTRAVENOUS | Status: AC
  Administered 2016-03-24: 420 mg via INTRAVENOUS
  Filled 2016-03-24: qty 14

## 2016-03-24 MED ORDER — HEPARIN SOD (PORK) LOCK FLUSH 100 UNIT/ML IV SOLN
500.0000 [IU] | Freq: Once | INTRAVENOUS | Status: AC | PRN
  Administered 2016-03-24: 500 [IU]
  Filled 2016-03-24: qty 5

## 2016-03-24 MED ORDER — DIPHENHYDRAMINE HCL 25 MG PO CAPS
25.0000 mg | ORAL_CAPSULE | Freq: Once | ORAL | Status: AC
  Administered 2016-03-24: 25 mg via ORAL

## 2016-03-24 MED ORDER — ACETAMINOPHEN 325 MG PO TABS
650.0000 mg | ORAL_TABLET | Freq: Once | ORAL | Status: AC
  Administered 2016-03-24: 650 mg via ORAL

## 2016-03-24 MED ORDER — ACETAMINOPHEN 325 MG PO TABS
ORAL_TABLET | ORAL | Status: AC
  Filled 2016-03-24: qty 2

## 2016-03-24 NOTE — Progress Notes (Signed)
Pt monitored 30 minutes post perjeta. Pt and VS stable at time of discharge.

## 2016-03-24 NOTE — Telephone Encounter (Signed)
per pof to sch pt appt-gave pt copy of avs °

## 2016-03-24 NOTE — Progress Notes (Signed)
Per Ubaldo Glassing RN Dr. Jana Hakim approved treatment even though ECHO not completed within 3 months.  Dr. Jana Hakim is scheduling future ECHO.

## 2016-03-24 NOTE — Progress Notes (Unsigned)
Patient was given the tablet for SysterHERS Study Pros in which she completed and she was thanked for her time. [name]

## 2016-03-24 NOTE — Patient Instructions (Signed)
[facility] Discharge Instructions for Patients Receiving Chemotherapy  Today you received the following chemotherapy agents Herceptin and Perjeta   To help prevent nausea and vomiting after your treatment, we encourage you to take your nausea medication as directed.    If you develop nausea and vomiting that is not controlled by your nausea medication, call the clinic.   BELOW ARE SYMPTOMS THAT SHOULD BE REPORTED IMMEDIATELY:  *FEVER GREATER THAN 100.5 F  *CHILLS WITH OR WITHOUT FEVER  NAUSEA AND VOMITING THAT IS NOT CONTROLLED WITH YOUR NAUSEA MEDICATION  *UNUSUAL SHORTNESS OF BREATH  *UNUSUAL BRUISING OR BLEEDING  TENDERNESS IN MOUTH AND THROAT WITH OR WITHOUT PRESENCE OF ULCERS  *URINARY PROBLEMS  *BOWEL PROBLEMS  UNUSUAL RASH Items with * indicate a potential emergency and should be followed up as soon as possible.  Feel free to call the clinic you have any questions or concerns. The clinic phone number is [phone].  Please show the CHEMO ALERT CARD at check-in to the Emergency Department and triage nurse.   

## 2016-03-26 ENCOUNTER — Ambulatory Visit: Payer: Medicare Other | Admitting: Family Medicine

## 2016-03-27 ENCOUNTER — Telehealth: Payer: Self-pay | Admitting: Pulmonary Disease

## 2016-03-27 NOTE — Telephone Encounter (Signed)
Per Dr. Elsworth Soho:  ONO results show that SATS stayed good on 2L;  Continue 2L O2 ------------------- Attempted to contact patient, left message on voicemail to call back.

## 2016-03-28 NOTE — Telephone Encounter (Signed)
Patient notified.  No questions or concerns at this time. Nothing further needed.   

## 2016-04-02 ENCOUNTER — Encounter: Payer: Self-pay | Admitting: Pulmonary Disease

## 2016-04-02 ENCOUNTER — Ambulatory Visit (INDEPENDENT_AMBULATORY_CARE_PROVIDER_SITE_OTHER): Payer: Medicare Other | Admitting: Family Medicine

## 2016-04-02 ENCOUNTER — Encounter: Payer: Self-pay | Admitting: Family Medicine

## 2016-04-02 VITALS — BP 146/70 | HR 71 | Temp 98.5°F | Wt 303.0 lb

## 2016-04-02 DIAGNOSIS — E1149 Type 2 diabetes mellitus with other diabetic neurological complication: Secondary | ICD-10-CM | POA: Diagnosis not present

## 2016-04-02 DIAGNOSIS — I10 Essential (primary) hypertension: Secondary | ICD-10-CM | POA: Diagnosis not present

## 2016-04-02 LAB — POCT GLYCOSYLATED HEMOGLOBIN (HGB A1C): Hemoglobin A1C: 7.3

## 2016-04-02 NOTE — Assessment & Plan Note (Signed)
BP Readings from Last 3 Encounters:  04/02/16 (!) 146/70  03/24/16 115/69  03/24/16 (!) 146/69   Slightly above goal today.  Continue to work on weight loss and ambulation

## 2016-04-02 NOTE — Assessment & Plan Note (Signed)
Slight improvement.  Continue to encourage

## 2016-04-02 NOTE — Assessment & Plan Note (Signed)
Improved continue current regimen

## 2016-04-02 NOTE — Progress Notes (Signed)
Subjective  Patient is presenting with the following illnesses  HYPERTENSION Disease Monitoring: Blood pressure range-not checking Chest pain, palpitations- no      Dyspnea- no Medications: Compliance- brings in Lightheadedness,Syncope- no   Edema- mild  DIABETES Disease Monitoring: Blood Sugar ranges-low 200s to low 100s Polyuria/phagia/dipsia- no      Visual problems- no Medications: Compliance- knows meds Hypoglycemic symptoms- no  OBESITY Current weight/BMI : Body mass index is 50.42 kg/m.    How long have been obese:  years Course:  Slow weight loss last few visits Problems or symptoms it causes:  Pain with walking   Things have tried to improve:  Walks more in home, cutting back on foos   Monitoring Labs and Parameters Last A1C:  Lab Results  Component Value Date   HGBA1C 7.3 04/02/2016    Last Lipid:     Component Value Date/Time   CHOL 140 10/27/2012 1653   HDL 56 10/27/2012 1653    Last Bmet  Potassium  Date Value Ref Range Status  03/24/2016 4.0 3.5 - 5.1 mEq/L Final   Sodium  Date Value Ref Range Status  03/24/2016 141 136 - 145 mEq/L Final   Creatinine  Date Value Ref Range Status  03/24/2016 0.8 0.6 - 1.1 mg/dL Final      Last BPs:  BP Readings from Last 3 Encounters:  04/02/16 (!) 146/70  03/24/16 115/69  03/24/16 (!) 146/69    Chief Complaint noted Review of Symptoms - see HPI PMH - Smoking status noted.     Objective Vital Signs reviewed In wheelchair Heart - Regular rate and rhythm.  No murmurs, gallops or rubs.    Lungs:  Normal respiratory effort, chest expands symmetrically. Lungs are clear to auscultation, no crackles or wheezes.     Assessments/Plans  No problem-specific Assessment & Plan notes found for this encounter.   See Encounter view if individual problem A/Ps not visible See after visit summary for details of patient instuctions

## 2016-04-02 NOTE — Progress Notes (Signed)
poc a1c

## 2016-04-02 NOTE — Patient Instructions (Signed)
Good to see you today!  Thanks for coming in.  Your diabetes is doing much better keep taking the medications as you are  Your weight is better  Aim to walk to the living room 4 x each time then go to 5  Aim to weigh 295 in 3 months  Come back in 3 months  Consider a mammogram and colonoscopy to prevent cancer

## 2016-04-11 ENCOUNTER — Other Ambulatory Visit: Payer: Self-pay

## 2016-04-11 ENCOUNTER — Other Ambulatory Visit: Payer: Self-pay | Admitting: Family Medicine

## 2016-04-11 DIAGNOSIS — C50212 Malignant neoplasm of upper-inner quadrant of left female breast: Secondary | ICD-10-CM

## 2016-04-14 ENCOUNTER — Ambulatory Visit (HOSPITAL_BASED_OUTPATIENT_CLINIC_OR_DEPARTMENT_OTHER): Payer: Medicare Other

## 2016-04-14 ENCOUNTER — Other Ambulatory Visit (HOSPITAL_BASED_OUTPATIENT_CLINIC_OR_DEPARTMENT_OTHER): Payer: Medicare Other

## 2016-04-14 VITALS — BP 140/91 | HR 65 | Temp 98.2°F | Resp 20

## 2016-04-14 DIAGNOSIS — C50212 Malignant neoplasm of upper-inner quadrant of left female breast: Secondary | ICD-10-CM

## 2016-04-14 DIAGNOSIS — Z5112 Encounter for antineoplastic immunotherapy: Secondary | ICD-10-CM

## 2016-04-14 DIAGNOSIS — C7802 Secondary malignant neoplasm of left lung: Secondary | ICD-10-CM

## 2016-04-14 DIAGNOSIS — C50912 Malignant neoplasm of unspecified site of left female breast: Secondary | ICD-10-CM

## 2016-04-14 LAB — COMPREHENSIVE METABOLIC PANEL
ALT: 47 U/L (ref 0–55)
AST: 26 U/L (ref 5–34)
Albumin: 3.1 g/dL — ABNORMAL LOW (ref 3.5–5.0)
Alkaline Phosphatase: 135 U/L (ref 40–150)
Anion Gap: 11 mEq/L (ref 3–11)
BUN: 18.2 mg/dL (ref 7.0–26.0)
CO2: 27 mEq/L (ref 22–29)
Calcium: 10 mg/dL (ref 8.4–10.4)
Chloride: 102 mEq/L (ref 98–109)
Creatinine: 0.8 mg/dL (ref 0.6–1.1)
EGFR: 78 mL/min/{1.73_m2} — ABNORMAL LOW (ref >90)
Glucose: 170 mg/dl — ABNORMAL HIGH (ref 70–140)
Potassium: 4.1 mEq/L (ref 3.5–5.1)
Sodium: 140 mEq/L (ref 136–145)
Total Bilirubin: 0.37 mg/dL (ref 0.20–1.20)
Total Protein: 7.4 g/dL (ref 6.4–8.3)

## 2016-04-14 LAB — CBC WITH DIFFERENTIAL/PLATELET
BASO%: 0.6 % (ref 0.0–2.0)
Basophils Absolute: 0 10*3/uL (ref 0.0–0.1)
EOS%: 1.8 % (ref 0.0–7.0)
Eosinophils Absolute: 0.1 10*3/uL (ref 0.0–0.5)
HCT: 42.7 % (ref 34.8–46.6)
HGB: 14 g/dL (ref 11.6–15.9)
LYMPH%: 33 % (ref 14.0–49.7)
MCH: 27.5 pg (ref 25.1–34.0)
MCHC: 32.7 g/dL (ref 31.5–36.0)
MCV: 84.1 fL (ref 79.5–101.0)
MONO#: 0.4 10*3/uL (ref 0.1–0.9)
MONO%: 5.8 % (ref 0.0–14.0)
NEUT#: 4 10*3/uL (ref 1.5–6.5)
NEUT%: 58.8 % (ref 38.4–76.8)
Platelets: 255 10*3/uL (ref 145–400)
RBC: 5.08 10*6/uL (ref 3.70–5.45)
RDW: 14 % (ref 11.2–14.5)
WBC: 6.8 10*3/uL (ref 3.9–10.3)
lymph#: 2.2 10*3/uL (ref 0.9–3.3)

## 2016-04-14 MED ORDER — TRASTUZUMAB CHEMO 150 MG IV SOLR
6.0000 mg/kg | Freq: Once | INTRAVENOUS | Status: AC
  Administered 2016-04-14: 903 mg via INTRAVENOUS
  Filled 2016-04-14: qty 43

## 2016-04-14 MED ORDER — SODIUM CHLORIDE 0.9 % IJ SOLN
10.0000 mL | INTRAMUSCULAR | Status: DC | PRN
  Administered 2016-04-14: 10 mL
  Filled 2016-04-14: qty 10

## 2016-04-14 MED ORDER — LORAZEPAM 2 MG/ML IJ SOLN
INTRAMUSCULAR | Status: AC
  Filled 2016-04-14: qty 1

## 2016-04-14 MED ORDER — ACETAMINOPHEN 325 MG PO TABS
ORAL_TABLET | ORAL | Status: AC
  Filled 2016-04-14: qty 2

## 2016-04-14 MED ORDER — DIPHENHYDRAMINE HCL 25 MG PO CAPS
ORAL_CAPSULE | ORAL | Status: AC
  Filled 2016-04-14: qty 1

## 2016-04-14 MED ORDER — LORAZEPAM 2 MG/ML IJ SOLN: 0.5000 mg | Freq: Once | INTRAMUSCULAR | Status: DC

## 2016-04-14 MED ORDER — SODIUM CHLORIDE 0.9 % IV SOLN
Freq: Once | INTRAVENOUS | Status: AC
  Administered 2016-04-14: 10:00:00 via INTRAVENOUS

## 2016-04-14 MED ORDER — ACETAMINOPHEN 325 MG PO TABS
650.0000 mg | ORAL_TABLET | Freq: Once | ORAL | Status: AC
  Administered 2016-04-14: 650 mg via ORAL

## 2016-04-14 MED ORDER — SODIUM CHLORIDE 0.9 % IV SOLN
420.0000 mg | Freq: Once | INTRAVENOUS | Status: AC
  Administered 2016-04-14: 420 mg via INTRAVENOUS
  Filled 2016-04-14: qty 14

## 2016-04-14 MED ORDER — DIPHENHYDRAMINE HCL 25 MG PO CAPS
25.0000 mg | ORAL_CAPSULE | Freq: Once | ORAL | Status: AC
  Administered 2016-04-14: 25 mg via ORAL

## 2016-04-14 MED ORDER — HEPARIN SOD (PORK) LOCK FLUSH 100 UNIT/ML IV SOLN
500.0000 [IU] | Freq: Once | INTRAVENOUS | Status: AC | PRN
  Administered 2016-04-14: 500 [IU]
  Filled 2016-04-14: qty 5

## 2016-04-14 NOTE — Patient Instructions (Signed)
[facility] Discharge Instructions for Patients Receiving Chemotherapy  Today you received the following chemotherapy agents Herceptin and Perjeta   To help prevent nausea and vomiting after your treatment, we encourage you to take your nausea medication as directed.    If you develop nausea and vomiting that is not controlled by your nausea medication, call the clinic.   BELOW ARE SYMPTOMS THAT SHOULD BE REPORTED IMMEDIATELY:  *FEVER GREATER THAN 100.5 F  *CHILLS WITH OR WITHOUT FEVER  NAUSEA AND VOMITING THAT IS NOT CONTROLLED WITH YOUR NAUSEA MEDICATION  *UNUSUAL SHORTNESS OF BREATH  *UNUSUAL BRUISING OR BLEEDING  TENDERNESS IN MOUTH AND THROAT WITH OR WITHOUT PRESENCE OF ULCERS  *URINARY PROBLEMS  *BOWEL PROBLEMS  UNUSUAL RASH Items with * indicate a potential emergency and should be followed up as soon as possible.  Feel free to call the clinic you have any questions or concerns. The clinic phone number is [phone].  Please show the CHEMO ALERT CARD at check-in to the Emergency Department and triage nurse.   

## 2016-04-15 LAB — CANCER ANTIGEN 27.29: CA 27.29: 19.5 U/mL (ref 0.0–38.6)

## 2016-04-16 ENCOUNTER — Ambulatory Visit
Admission: RE | Admit: 2016-04-16 | Discharge: 2016-04-16 | Disposition: A | Discharge status: Home / Self Care | Payer: Medicare Other | Source: Ambulatory Visit | Attending: Oncology | Admitting: Oncology

## 2016-04-16 DIAGNOSIS — E0842 Diabetes mellitus due to underlying condition with diabetic polyneuropathy: Secondary | ICD-10-CM

## 2016-04-16 DIAGNOSIS — C50912 Malignant neoplasm of unspecified site of left female breast: Secondary | ICD-10-CM

## 2016-04-16 DIAGNOSIS — C50212 Malignant neoplasm of upper-inner quadrant of left female breast: Secondary | ICD-10-CM

## 2016-04-16 DIAGNOSIS — E1149 Type 2 diabetes mellitus with other diabetic neurological complication: Secondary | ICD-10-CM

## 2016-04-16 DIAGNOSIS — C7802 Secondary malignant neoplasm of left lung: Principal | ICD-10-CM

## 2016-04-17 ENCOUNTER — Ambulatory Visit (HOSPITAL_COMMUNITY): Payer: Medicare Other

## 2016-04-17 ENCOUNTER — Encounter (HOSPITAL_COMMUNITY): Payer: Medicare Other | Admitting: Internal Medicine

## 2016-05-05 ENCOUNTER — Other Ambulatory Visit (HOSPITAL_BASED_OUTPATIENT_CLINIC_OR_DEPARTMENT_OTHER): Payer: Medicare Other

## 2016-05-05 ENCOUNTER — Ambulatory Visit (HOSPITAL_BASED_OUTPATIENT_CLINIC_OR_DEPARTMENT_OTHER): Payer: Medicare Other

## 2016-05-05 ENCOUNTER — Telehealth: Payer: Self-pay | Admitting: *Deleted

## 2016-05-05 ENCOUNTER — Other Ambulatory Visit: Payer: Medicare Other

## 2016-05-05 DIAGNOSIS — Z5112 Encounter for antineoplastic immunotherapy: Secondary | ICD-10-CM | POA: Diagnosis not present

## 2016-05-05 DIAGNOSIS — C7802 Secondary malignant neoplasm of left lung: Secondary | ICD-10-CM | POA: Diagnosis not present

## 2016-05-05 DIAGNOSIS — C50212 Malignant neoplasm of upper-inner quadrant of left female breast: Secondary | ICD-10-CM

## 2016-05-05 DIAGNOSIS — C50912 Malignant neoplasm of unspecified site of left female breast: Secondary | ICD-10-CM

## 2016-05-05 LAB — COMPREHENSIVE METABOLIC PANEL
ALT: 44 U/L (ref 0–55)
AST: 23 U/L (ref 5–34)
Albumin: 3.1 g/dL — ABNORMAL LOW (ref 3.5–5.0)
Alkaline Phosphatase: 138 U/L (ref 40–150)
Anion Gap: 9 mEq/L (ref 3–11)
BUN: 20.5 mg/dL (ref 7.0–26.0)
CO2: 29 mEq/L (ref 22–29)
Calcium: 10 mg/dL (ref 8.4–10.4)
Chloride: 104 mEq/L (ref 98–109)
Creatinine: 0.8 mg/dL (ref 0.6–1.1)
EGFR: 78 mL/min/{1.73_m2} — ABNORMAL LOW (ref >90)
Glucose: 148 mg/dl — ABNORMAL HIGH (ref 70–140)
Potassium: 4.4 mEq/L (ref 3.5–5.1)
Sodium: 142 mEq/L (ref 136–145)
Total Bilirubin: 0.33 mg/dL (ref 0.20–1.20)
Total Protein: 7.5 g/dL (ref 6.4–8.3)

## 2016-05-05 LAB — CBC WITH DIFFERENTIAL/PLATELET
BASO%: 0.3 % (ref 0.0–2.0)
Basophils Absolute: 0 10*3/uL (ref 0.0–0.1)
EOS%: 1.7 % (ref 0.0–7.0)
Eosinophils Absolute: 0.1 10*3/uL (ref 0.0–0.5)
HCT: 43 % (ref 34.8–46.6)
HGB: 14.1 g/dL (ref 11.6–15.9)
LYMPH%: 34 % (ref 14.0–49.7)
MCH: 27.6 pg (ref 25.1–34.0)
MCHC: 32.8 g/dL (ref 31.5–36.0)
MCV: 84.1 fL (ref 79.5–101.0)
MONO#: 0.5 10*3/uL (ref 0.1–0.9)
MONO%: 6.7 % (ref 0.0–14.0)
NEUT#: 4.3 10*3/uL (ref 1.5–6.5)
NEUT%: 57.3 % (ref 38.4–76.8)
Platelets: 260 10*3/uL (ref 145–400)
RBC: 5.11 10*6/uL (ref 3.70–5.45)
RDW: 14.3 % (ref 11.2–14.5)
WBC: 7.6 10*3/uL (ref 3.9–10.3)
lymph#: 2.6 10*3/uL (ref 0.9–3.3)

## 2016-05-05 MED ORDER — LORAZEPAM 2 MG/ML IJ SOLN
INTRAMUSCULAR | Status: AC
  Filled 2016-05-05: qty 1

## 2016-05-05 MED ORDER — ACETAMINOPHEN 325 MG PO TABS
650.0000 mg | ORAL_TABLET | Freq: Once | ORAL | Status: AC
  Administered 2016-05-05: 650 mg via ORAL

## 2016-05-05 MED ORDER — SODIUM CHLORIDE 0.9 % IV SOLN
420.0000 mg | Freq: Once | INTRAVENOUS | Status: AC
  Administered 2016-05-05: 420 mg via INTRAVENOUS
  Filled 2016-05-05: qty 14

## 2016-05-05 MED ORDER — ACETAMINOPHEN 325 MG PO TABS
ORAL_TABLET | ORAL | Status: AC
  Filled 2016-05-05: qty 2

## 2016-05-05 MED ORDER — DIPHENHYDRAMINE HCL 25 MG PO CAPS
ORAL_CAPSULE | ORAL | Status: AC
  Filled 2016-05-05: qty 1

## 2016-05-05 MED ORDER — DIPHENHYDRAMINE HCL 25 MG PO CAPS
25.0000 mg | ORAL_CAPSULE | Freq: Once | ORAL | Status: AC
  Administered 2016-05-05: 25 mg via ORAL

## 2016-05-05 MED ORDER — TRASTUZUMAB CHEMO 150 MG IV SOLR
900.0000 mg | Freq: Once | INTRAVENOUS | Status: AC
  Administered 2016-05-05: 900 mg via INTRAVENOUS
  Filled 2016-05-05: qty 42.86

## 2016-05-05 MED ORDER — SODIUM CHLORIDE 0.9 % IV SOLN
Freq: Once | INTRAVENOUS | Status: AC
  Administered 2016-05-05: 10:00:00 via INTRAVENOUS

## 2016-05-05 MED ORDER — HEPARIN SOD (PORK) LOCK FLUSH 100 UNIT/ML IV SOLN
500.0000 [IU] | Freq: Once | INTRAVENOUS | Status: AC | PRN
  Administered 2016-05-05: 500 [IU]
  Filled 2016-05-05: qty 5

## 2016-05-05 MED ORDER — SODIUM CHLORIDE 0.9 % IJ SOLN
10.0000 mL | INTRAMUSCULAR | Status: DC | PRN
  Administered 2016-05-05: 10 mL
  Filled 2016-05-05: qty 10

## 2016-05-05 MED ORDER — LORAZEPAM 2 MG/ML IJ SOLN: 0.5000 mg | Freq: Once | INTRAMUSCULAR | Status: DC

## 2016-05-05 NOTE — Patient Instructions (Signed)
[facility] Discharge Instructions for Patients Receiving Chemotherapy  Today you received the following chemotherapy agents:   To help prevent nausea and vomiting after your treatment, we encourage you to take your nausea medication as directed. If you develop nausea and vomiting that is not controlled by your nausea medication, call the clinic.   BELOW ARE SYMPTOMS THAT SHOULD BE REPORTED IMMEDIATELY:  *FEVER GREATER THAN 100.5 F  *CHILLS WITH OR WITHOUT FEVER  NAUSEA AND VOMITING THAT IS NOT CONTROLLED WITH YOUR NAUSEA MEDICATION  *UNUSUAL SHORTNESS OF BREATH  *UNUSUAL BRUISING OR BLEEDING  TENDERNESS IN MOUTH AND THROAT WITH OR WITHOUT PRESENCE OF ULCERS  *URINARY PROBLEMS  *BOWEL PROBLEMS  UNUSUAL RASH Items with * indicate a potential emergency and should be followed up as soon as possible.  Feel free to call the clinic you have any questions or concerns. The clinic phone number is [phone].  Please show the [place] at check-in to the Emergency Department and triage nurse.

## 2016-05-05 NOTE — Progress Notes (Signed)
Per Dr Jana Hakim: ok to treat today with last echo done 10/2015, showed EJ of 60-65%, pt aware need to obtain another echo. Order has been active since 02/2016. Pt has not scheduled appt. Discussed importance of appt.

## 2016-05-05 NOTE — Telephone Encounter (Signed)
Per Cecille Rubin RN/Infusion pt has not had ECHO since March & asked if she should get treatment with herceptin today.  OK per Dr Jana Hakim but should reorder ECHO.  Order is in computer.  Called Dr. Haroldine Laws & scheduled ECHO for 05/13/16 @ 11 am at [facility].  Infusion RN will give pt appt.

## 2016-05-06 LAB — CANCER ANTIGEN 27.29: CA 27.29: 19.6 U/mL (ref 0.0–38.6)

## 2016-05-13 ENCOUNTER — Ambulatory Visit (HOSPITAL_COMMUNITY)
Admission: RE | Admit: 2016-05-13 | Discharge: 2016-05-13 | Disposition: A | Discharge status: Home / Self Care | Payer: Medicare Other | Source: Ambulatory Visit | Attending: Family Medicine | Admitting: Family Medicine

## 2016-05-13 DIAGNOSIS — I509 Heart failure, unspecified: Secondary | ICD-10-CM | POA: Diagnosis not present

## 2016-05-13 DIAGNOSIS — C50912 Malignant neoplasm of unspecified site of left female breast: Secondary | ICD-10-CM | POA: Insufficient documentation

## 2016-05-13 DIAGNOSIS — Z5181 Encounter for therapeutic drug level monitoring: Secondary | ICD-10-CM | POA: Insufficient documentation

## 2016-05-13 DIAGNOSIS — I517 Cardiomegaly: Secondary | ICD-10-CM | POA: Diagnosis not present

## 2016-05-13 DIAGNOSIS — Z79899 Other long term (current) drug therapy: Secondary | ICD-10-CM | POA: Diagnosis not present

## 2016-05-13 DIAGNOSIS — C7802 Secondary malignant neoplasm of left lung: Secondary | ICD-10-CM | POA: Diagnosis not present

## 2016-05-13 NOTE — Progress Notes (Addendum)
  Echocardiogram 2D Echocardiogram has been performed.  [name] 05/13/2016, 12:00 PM

## 2016-05-19 ENCOUNTER — Other Ambulatory Visit (HOSPITAL_COMMUNITY): Payer: Medicare Other

## 2016-05-19 ENCOUNTER — Encounter (HOSPITAL_COMMUNITY): Payer: Medicare Other | Admitting: Internal Medicine

## 2016-05-26 ENCOUNTER — Other Ambulatory Visit (HOSPITAL_BASED_OUTPATIENT_CLINIC_OR_DEPARTMENT_OTHER): Payer: Medicare Other

## 2016-05-26 ENCOUNTER — Encounter: Payer: Self-pay | Admitting: Oncology

## 2016-05-26 ENCOUNTER — Ambulatory Visit (HOSPITAL_BASED_OUTPATIENT_CLINIC_OR_DEPARTMENT_OTHER): Payer: Medicare Other

## 2016-05-26 ENCOUNTER — Ambulatory Visit (HOSPITAL_BASED_OUTPATIENT_CLINIC_OR_DEPARTMENT_OTHER): Payer: Medicare Other | Admitting: Oncology

## 2016-05-26 VITALS — BP 129/61 | HR 79 | Temp 98.0°F | Resp 18 | Ht 65.0 in | Wt 308.3 lb

## 2016-05-26 DIAGNOSIS — C50212 Malignant neoplasm of upper-inner quadrant of left female breast: Secondary | ICD-10-CM

## 2016-05-26 DIAGNOSIS — C7802 Secondary malignant neoplasm of left lung: Secondary | ICD-10-CM | POA: Diagnosis not present

## 2016-05-26 DIAGNOSIS — C50912 Malignant neoplasm of unspecified site of left female breast: Secondary | ICD-10-CM | POA: Diagnosis not present

## 2016-05-26 DIAGNOSIS — Z5112 Encounter for antineoplastic immunotherapy: Secondary | ICD-10-CM | POA: Diagnosis not present

## 2016-05-26 DIAGNOSIS — M48061 Spinal stenosis, lumbar region without neurogenic claudication: Secondary | ICD-10-CM

## 2016-05-26 DIAGNOSIS — M25562 Pain in left knee: Secondary | ICD-10-CM

## 2016-05-26 DIAGNOSIS — Z171 Estrogen receptor negative status [ER-]: Secondary | ICD-10-CM

## 2016-05-26 DIAGNOSIS — M47813 Spondylosis without myelopathy or radiculopathy, cervicothoracic region: Secondary | ICD-10-CM

## 2016-05-26 DIAGNOSIS — Z170 Estrogen receptor positive status [ER+]: Secondary | ICD-10-CM

## 2016-05-26 DIAGNOSIS — Z79811 Long term (current) use of aromatase inhibitors: Secondary | ICD-10-CM

## 2016-05-26 LAB — CBC WITH DIFFERENTIAL/PLATELET
BASO%: 0.1 % (ref 0.0–2.0)
Basophils Absolute: 0 10*3/uL (ref 0.0–0.1)
EOS%: 1.8 % (ref 0.0–7.0)
Eosinophils Absolute: 0.1 10*3/uL (ref 0.0–0.5)
HCT: 43.4 % (ref 34.8–46.6)
HGB: 14.4 g/dL (ref 11.6–15.9)
LYMPH%: 33.9 % (ref 14.0–49.7)
MCH: 28.3 pg (ref 25.1–34.0)
MCHC: 33.2 g/dL (ref 31.5–36.0)
MCV: 85.3 fL (ref 79.5–101.0)
MONO#: 0.4 10*3/uL (ref 0.1–0.9)
MONO%: 5 % (ref 0.0–14.0)
NEUT#: 4.2 10*3/uL (ref 1.5–6.5)
NEUT%: 59.2 % (ref 38.4–76.8)
Platelets: 236 10*3/uL (ref 145–400)
RBC: 5.09 10*6/uL (ref 3.70–5.45)
RDW: 14.1 % (ref 11.2–14.5)
WBC: 7 10*3/uL (ref 3.9–10.3)
lymph#: 2.4 10*3/uL (ref 0.9–3.3)

## 2016-05-26 LAB — COMPREHENSIVE METABOLIC PANEL
ALT: 38 U/L (ref 0–55)
AST: 22 U/L (ref 5–34)
Albumin: 3.3 g/dL — ABNORMAL LOW (ref 3.5–5.0)
Alkaline Phosphatase: 137 U/L (ref 40–150)
Anion Gap: 11 mEq/L (ref 3–11)
BUN: 18.6 mg/dL (ref 7.0–26.0)
CO2: 29 mEq/L (ref 22–29)
Calcium: 10 mg/dL (ref 8.4–10.4)
Chloride: 103 mEq/L (ref 98–109)
Creatinine: 0.8 mg/dL (ref 0.6–1.1)
EGFR: 80 mL/min/{1.73_m2} — ABNORMAL LOW (ref >90)
Glucose: 123 mg/dl (ref 70–140)
Potassium: 3.7 mEq/L (ref 3.5–5.1)
Sodium: 143 mEq/L (ref 136–145)
Total Bilirubin: 0.34 mg/dL (ref 0.20–1.20)
Total Protein: 7.8 g/dL (ref 6.4–8.3)

## 2016-05-26 MED ORDER — DIPHENHYDRAMINE HCL 25 MG PO CAPS
25.0000 mg | ORAL_CAPSULE | Freq: Once | ORAL | Status: AC
  Administered 2016-05-26: 25 mg via ORAL

## 2016-05-26 MED ORDER — HEPARIN SOD (PORK) LOCK FLUSH 100 UNIT/ML IV SOLN
500.0000 [IU] | Freq: Once | INTRAVENOUS | Status: AC | PRN
  Administered 2016-05-26: 500 [IU]
  Filled 2016-05-26: qty 5

## 2016-05-26 MED ORDER — SODIUM CHLORIDE 0.9 % IV SOLN
420.0000 mg | Freq: Once | INTRAVENOUS | Status: AC
  Administered 2016-05-26: 420 mg via INTRAVENOUS
  Filled 2016-05-26: qty 14

## 2016-05-26 MED ORDER — OXYCODONE HCL ER 10 MG PO T12A: 10.0000 mg | EXTENDED_RELEASE_TABLET | Freq: Two times a day (BID) | ORAL | 0 refills | Status: DC

## 2016-05-26 MED ORDER — ACETAMINOPHEN 325 MG PO TABS
650.0000 mg | ORAL_TABLET | Freq: Once | ORAL | Status: AC
Start: 2016-05-26 — End: 2016-05-26
  Administered 2016-05-26: 650 mg via ORAL

## 2016-05-26 MED ORDER — SODIUM CHLORIDE 0.9 % IJ SOLN
10.0000 mL | INTRAMUSCULAR | Status: DC | PRN
  Administered 2016-05-26: 10 mL
  Filled 2016-05-26: qty 10

## 2016-05-26 MED ORDER — LORAZEPAM 2 MG/ML IJ SOLN
0.5000 mg | Freq: Once | INTRAMUSCULAR | Status: AC
Start: 2016-05-26 — End: 2016-05-26
  Administered 2016-05-26: 0.5 mg via INTRAVENOUS

## 2016-05-26 MED ORDER — TRASTUZUMAB CHEMO 150 MG IV SOLR
900.0000 mg | Freq: Once | INTRAVENOUS | Status: AC
  Administered 2016-05-26: 900 mg via INTRAVENOUS
  Filled 2016-05-26: qty 42.86

## 2016-05-26 MED ORDER — SODIUM CHLORIDE 0.9 % IV SOLN
Freq: Once | INTRAVENOUS | Status: AC
  Administered 2016-05-26: 11:00:00 via INTRAVENOUS

## 2016-05-26 NOTE — Patient Instructions (Signed)
[facility] Discharge Instructions for Patients Receiving Chemotherapy  Today you received the following chemotherapy agents Herceptin and Perjeta   To help prevent nausea and vomiting after your treatment, we encourage you to take your nausea medication as directed.    If you develop nausea and vomiting that is not controlled by your nausea medication, call the clinic.   BELOW ARE SYMPTOMS THAT SHOULD BE REPORTED IMMEDIATELY:  *FEVER GREATER THAN 100.5 F  *CHILLS WITH OR WITHOUT FEVER  NAUSEA AND VOMITING THAT IS NOT CONTROLLED WITH YOUR NAUSEA MEDICATION  *UNUSUAL SHORTNESS OF BREATH  *UNUSUAL BRUISING OR BLEEDING  TENDERNESS IN MOUTH AND THROAT WITH OR WITHOUT PRESENCE OF ULCERS  *URINARY PROBLEMS  *BOWEL PROBLEMS  UNUSUAL RASH Items with * indicate a potential emergency and should be followed up as soon as possible.  Feel free to call the clinic you have any questions or concerns. The clinic phone number is [phone].  Please show the CHEMO ALERT CARD at check-in to the Emergency Department and triage nurse.   
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
## 2016-05-27 LAB — CANCER ANTIGEN 27.29: CA 27.29: 18.8 U/mL (ref 0.0–38.6)

## 2016-05-29 ENCOUNTER — Telehealth: Payer: Self-pay | Admitting: *Deleted

## 2016-05-29 NOTE — Telephone Encounter (Signed)
Patient called and wanted to make sure that Dr. Jana Hakim had placed the order for a referral to Dr. Bernadette Hoit office. Informed her that I could see the referral in the computer. Also, I told her that it could take up to a week for the office to call and schedule an appointment. Patient verbalized understanding.

## 2016-06-16 ENCOUNTER — Other Ambulatory Visit (HOSPITAL_BASED_OUTPATIENT_CLINIC_OR_DEPARTMENT_OTHER): Payer: Medicare Other

## 2016-06-16 ENCOUNTER — Ambulatory Visit (HOSPITAL_BASED_OUTPATIENT_CLINIC_OR_DEPARTMENT_OTHER): Payer: Medicare Other

## 2016-06-16 ENCOUNTER — Encounter: Payer: Self-pay | Admitting: *Deleted

## 2016-06-16 VITALS — BP 147/59 | HR 61 | Temp 98.1°F | Resp 18

## 2016-06-16 DIAGNOSIS — C7802 Secondary malignant neoplasm of left lung: Secondary | ICD-10-CM

## 2016-06-16 DIAGNOSIS — C50912 Malignant neoplasm of unspecified site of left female breast: Secondary | ICD-10-CM | POA: Diagnosis not present

## 2016-06-16 DIAGNOSIS — C50212 Malignant neoplasm of upper-inner quadrant of left female breast: Secondary | ICD-10-CM | POA: Diagnosis not present

## 2016-06-16 DIAGNOSIS — Z171 Estrogen receptor negative status [ER-]: Secondary | ICD-10-CM

## 2016-06-16 DIAGNOSIS — Z5112 Encounter for antineoplastic immunotherapy: Secondary | ICD-10-CM | POA: Diagnosis not present

## 2016-06-16 LAB — CBC WITH DIFFERENTIAL/PLATELET
BASO%: 0.3 % (ref 0.0–2.0)
Basophils Absolute: 0 10*3/uL (ref 0.0–0.1)
EOS%: 2.8 % (ref 0.0–7.0)
Eosinophils Absolute: 0.3 10*3/uL (ref 0.0–0.5)
HCT: 43.1 % (ref 34.8–46.6)
HGB: 14 g/dL (ref 11.6–15.9)
LYMPH%: 27.3 % (ref 14.0–49.7)
MCH: 27.5 pg (ref 25.1–34.0)
MCHC: 32.4 g/dL (ref 31.5–36.0)
MCV: 84.8 fL (ref 79.5–101.0)
MONO#: 0.5 10*3/uL (ref 0.1–0.9)
MONO%: 5.2 % (ref 0.0–14.0)
NEUT#: 5.8 10*3/uL (ref 1.5–6.5)
NEUT%: 64.4 % (ref 38.4–76.8)
Platelets: 276 10*3/uL (ref 145–400)
RBC: 5.09 10*6/uL (ref 3.70–5.45)
RDW: 14.7 % — ABNORMAL HIGH (ref 11.2–14.5)
WBC: 9.1 10*3/uL (ref 3.9–10.3)
lymph#: 2.5 10*3/uL (ref 0.9–3.3)

## 2016-06-16 LAB — COMPREHENSIVE METABOLIC PANEL
ALT: 43 U/L (ref 0–55)
AST: 25 U/L (ref 5–34)
Albumin: 3 g/dL — ABNORMAL LOW (ref 3.5–5.0)
Alkaline Phosphatase: 144 U/L (ref 40–150)
Anion Gap: 7 mEq/L (ref 3–11)
BUN: 22.8 mg/dL (ref 7.0–26.0)
CO2: 32 mEq/L — ABNORMAL HIGH (ref 22–29)
Calcium: 9.4 mg/dL (ref 8.4–10.4)
Chloride: 101 mEq/L (ref 98–109)
Creatinine: 0.8 mg/dL (ref 0.6–1.1)
EGFR: 77 mL/min/{1.73_m2} — ABNORMAL LOW (ref >90)
Glucose: 153 mg/dl — ABNORMAL HIGH (ref 70–140)
Potassium: 4.6 mEq/L (ref 3.5–5.1)
Sodium: 140 mEq/L (ref 136–145)
Total Bilirubin: 0.43 mg/dL (ref 0.20–1.20)
Total Protein: 7.1 g/dL (ref 6.4–8.3)

## 2016-06-16 MED ORDER — LORAZEPAM 2 MG/ML IJ SOLN
INTRAMUSCULAR | Status: AC
  Filled 2016-06-16: qty 1

## 2016-06-16 MED ORDER — TRASTUZUMAB CHEMO 150 MG IV SOLR
900.0000 mg | Freq: Once | INTRAVENOUS | Status: AC
  Administered 2016-06-16: 900 mg via INTRAVENOUS
  Filled 2016-06-16: qty 42.86

## 2016-06-16 MED ORDER — LORAZEPAM 2 MG/ML IJ SOLN
0.5000 mg | Freq: Once | INTRAMUSCULAR | Status: DC
Start: 2016-06-16 — End: 2016-06-16

## 2016-06-16 MED ORDER — HEPARIN SOD (PORK) LOCK FLUSH 100 UNIT/ML IV SOLN
500.0000 [IU] | Freq: Once | INTRAVENOUS | Status: AC | PRN
  Administered 2016-06-16: 500 [IU]
  Filled 2016-06-16: qty 5

## 2016-06-16 MED ORDER — DIPHENHYDRAMINE HCL 25 MG PO CAPS
ORAL_CAPSULE | ORAL | Status: AC
  Filled 2016-06-16: qty 1

## 2016-06-16 MED ORDER — SODIUM CHLORIDE 0.9 % IV SOLN
420.0000 mg | Freq: Once | INTRAVENOUS | Status: AC
  Administered 2016-06-16: 420 mg via INTRAVENOUS
  Filled 2016-06-16: qty 14

## 2016-06-16 MED ORDER — DIPHENHYDRAMINE HCL 25 MG PO CAPS
25.0000 mg | ORAL_CAPSULE | Freq: Once | ORAL | Status: AC
  Administered 2016-06-16: 25 mg via ORAL

## 2016-06-16 MED ORDER — SODIUM CHLORIDE 0.9 % IJ SOLN
10.0000 mL | INTRAMUSCULAR | Status: DC | PRN
  Administered 2016-06-16: 10 mL
  Filled 2016-06-16: qty 10

## 2016-06-16 MED ORDER — SODIUM CHLORIDE 0.9 % IV SOLN
Freq: Once | INTRAVENOUS | Status: AC
  Administered 2016-06-16: 11:00:00 via INTRAVENOUS

## 2016-06-16 MED ORDER — ACETAMINOPHEN 325 MG PO TABS
650.0000 mg | ORAL_TABLET | Freq: Once | ORAL | Status: AC
  Administered 2016-06-16: 650 mg via ORAL

## 2016-06-16 MED ORDER — ACETAMINOPHEN 325 MG PO TABS
ORAL_TABLET | ORAL | Status: AC
  Filled 2016-06-16: qty 2

## 2016-06-16 NOTE — Patient Instructions (Signed)
[facility] Discharge Instructions for Patients Receiving Chemotherapy  Today you received the following chemotherapy agents Herceptin and Perjeta   To help prevent nausea and vomiting after your treatment, we encourage you to take your nausea medication as directed.    If you develop nausea and vomiting that is not controlled by your nausea medication, call the clinic.   BELOW ARE SYMPTOMS THAT SHOULD BE REPORTED IMMEDIATELY:  *FEVER GREATER THAN 100.5 F  *CHILLS WITH OR WITHOUT FEVER  NAUSEA AND VOMITING THAT IS NOT CONTROLLED WITH YOUR NAUSEA MEDICATION  *UNUSUAL SHORTNESS OF BREATH  *UNUSUAL BRUISING OR BLEEDING  TENDERNESS IN MOUTH AND THROAT WITH OR WITHOUT PRESENCE OF ULCERS  *URINARY PROBLEMS  *BOWEL PROBLEMS  UNUSUAL RASH Items with * indicate a potential emergency and should be followed up as soon as possible.  Feel free to call the clinic you have any questions or concerns. The clinic phone number is [phone].  Please show the CHEMO ALERT CARD at check-in to the Emergency Department and triage nurse.   

## 2016-06-16 NOTE — Progress Notes (Signed)
06/16/2016 0950 SystHERs study Met with the patient while at [facility] for lab and infusion.  She was with her niece.  She was informed that the study is closing.   She was informed that today will be the last PRO she has to complete and will be the last submission of  quarterly assessment information this RN will send in.  This RN thanked the patient for her time and commitment to the study.  She was reminded she will be reimbursed for completing today's PROs.  The patient denied any questions.  This RN wished the patient well. Marcellus Scott, RN, BSN, MHA, OCN

## 2016-06-17 LAB — CANCER ANTIGEN 27.29: CA 27.29: 19.6 U/mL (ref 0.0–38.6)

## 2016-06-18 ENCOUNTER — Encounter: Payer: Self-pay | Admitting: Family Medicine

## 2016-06-18 ENCOUNTER — Ambulatory Visit (HOSPITAL_COMMUNITY)
Admission: RE | Admit: 2016-06-18 | Discharge: 2016-06-18 | Disposition: A | Discharge status: Home / Self Care | Payer: Medicare Other | Source: Ambulatory Visit | Attending: Family Medicine | Admitting: Family Medicine

## 2016-06-18 ENCOUNTER — Telehealth: Payer: Self-pay

## 2016-06-18 ENCOUNTER — Ambulatory Visit (INDEPENDENT_AMBULATORY_CARE_PROVIDER_SITE_OTHER): Payer: Medicare Other | Admitting: Family Medicine

## 2016-06-18 DIAGNOSIS — M79605 Pain in left leg: Secondary | ICD-10-CM | POA: Insufficient documentation

## 2016-06-18 MED ORDER — GABAPENTIN 100 MG PO CAPS: 100.0000 mg | ORAL_CAPSULE | Freq: Three times a day (TID) | ORAL | 1 refills | Status: DC

## 2016-06-18 NOTE — Progress Notes (Signed)
Subjective  Patient is presenting with the following illnesses  Left Leg Pain Complains of severe left leg pain centered around her knee that has been worsening over the last 4-5 weeks.  Came on suddenly wihout any recognized trauma or over use.  Has been seen by [facility] and by report had an Xray and MRI and steroid injection.   Pain is severe from left upper leg to below knee.  Feels whole leg is swollen.  (no shortness of breath or chest pain) No fever or other joint pain other than her usual back pain. Oxcodone from her oncologist does not seem to help.  No loss of bowel or bladder control or fever or weight loss.  Aleve helps a small amount   Chief Complaint noted Review of Symptoms - see HPI PMH - Smoking status noted.     Objective Vital Signs reviewed Sitting in wheel chair Able to stand but not put weight on leg Can flex and extend knee about 30 degree range of motion without much pain Tender to touch over medial lower knee and lower outer thigh Skin appears normal No definite effusion although patient is large Leg does appear somewhat diffusely swollen Range of motion in ankle and hip is normal Strength in ankle is normal Palpable post tibial pulses and good cap refill      Assessments/Plans  No problem-specific Assessment & Plan notes found for this encounter.   See Encounter view if individual problem A/Ps not visible See after visit summary for details of patient instuctions

## 2016-06-18 NOTE — Patient Instructions (Addendum)
Sorry about the leg pain  Take the neurontin 1 capsule three times a day.  If it does not make you too sleepy increase to 2 caps three times a day and then 3 caps three times a day.  The pain should improve slowly over the next few days.    Would use Aspercreme otc three times a day rub on the knee  Continue the aleve as needed  If you have any redness or fever or shortness of breath or chest pain call us immediately or if severe go to the ER  I will contact you about the ultrasound

## 2016-06-18 NOTE — Telephone Encounter (Signed)
[place] from the vascular center called and informed me pts ultrasound is negtaive. If you have any questions for her, you can call her at [phone].

## 2016-06-18 NOTE — Progress Notes (Signed)
VASCULAR LAB PRELIMINARY  PRELIMINARY  PRELIMINARY  PRELIMINARY  Left lower extremity venous duplex completed.    Preliminary report:  Technically difficult due to Body habitus. No obvious evidence of DVT, superficial thrombosis, or Baker's cyst.  Anni Hocevar, RVS 06/18/2016, 1:53 PM

## 2016-06-19 ENCOUNTER — Telehealth: Payer: Self-pay | Admitting: Family Medicine

## 2016-06-19 NOTE — Telephone Encounter (Signed)
Called patient  Is improved some  Taking 1 gabapentin no sleepiness - asked to go up to 2 then 3 tid   No DVT

## 2016-06-19 NOTE — Assessment & Plan Note (Signed)
Severe of unsure cause.  Given history of cancer and prior upper extrem dvt will obtain doppler.  If no dvt and given prior orthopedist work up this seems most consistent with nerve pain perhaps a sciatica variant.  Will start gabapentin and titrate.  See after visit summary

## 2016-07-07 ENCOUNTER — Other Ambulatory Visit (HOSPITAL_BASED_OUTPATIENT_CLINIC_OR_DEPARTMENT_OTHER): Payer: Medicare Other

## 2016-07-07 ENCOUNTER — Ambulatory Visit (HOSPITAL_BASED_OUTPATIENT_CLINIC_OR_DEPARTMENT_OTHER): Payer: Medicare Other

## 2016-07-07 VITALS — BP 154/71 | HR 69 | Temp 98.4°F | Resp 20

## 2016-07-07 DIAGNOSIS — C50912 Malignant neoplasm of unspecified site of left female breast: Secondary | ICD-10-CM | POA: Diagnosis not present

## 2016-07-07 DIAGNOSIS — Z5112 Encounter for antineoplastic immunotherapy: Secondary | ICD-10-CM | POA: Diagnosis not present

## 2016-07-07 DIAGNOSIS — C50212 Malignant neoplasm of upper-inner quadrant of left female breast: Secondary | ICD-10-CM

## 2016-07-07 DIAGNOSIS — C7802 Secondary malignant neoplasm of left lung: Secondary | ICD-10-CM | POA: Diagnosis not present

## 2016-07-07 DIAGNOSIS — Z171 Estrogen receptor negative status [ER-]: Secondary | ICD-10-CM

## 2016-07-07 LAB — CBC WITH DIFFERENTIAL/PLATELET
BASO%: 0.4 % (ref 0.0–2.0)
Basophils Absolute: 0 10*3/uL (ref 0.0–0.1)
EOS%: 2.3 % (ref 0.0–7.0)
Eosinophils Absolute: 0.2 10*3/uL (ref 0.0–0.5)
HCT: 40.3 % (ref 34.8–46.6)
HGB: 13.1 g/dL (ref 11.6–15.9)
LYMPH%: 31.6 % (ref 14.0–49.7)
MCH: 27.6 pg (ref 25.1–34.0)
MCHC: 32.4 g/dL (ref 31.5–36.0)
MCV: 85.1 fL (ref 79.5–101.0)
MONO#: 0.4 10*3/uL (ref 0.1–0.9)
MONO%: 5.1 % (ref 0.0–14.0)
NEUT#: 4.1 10*3/uL (ref 1.5–6.5)
NEUT%: 60.6 % (ref 38.4–76.8)
Platelets: 255 10*3/uL (ref 145–400)
RBC: 4.74 10*6/uL (ref 3.70–5.45)
RDW: 14.6 % — ABNORMAL HIGH (ref 11.2–14.5)
WBC: 6.8 10*3/uL (ref 3.9–10.3)
lymph#: 2.1 10*3/uL (ref 0.9–3.3)

## 2016-07-07 LAB — COMPREHENSIVE METABOLIC PANEL
ALT: 30 U/L (ref 0–55)
AST: 19 U/L (ref 5–34)
Albumin: 2.9 g/dL — ABNORMAL LOW (ref 3.5–5.0)
Alkaline Phosphatase: 129 U/L (ref 40–150)
Anion Gap: 8 mEq/L (ref 3–11)
BUN: 14.9 mg/dL (ref 7.0–26.0)
CO2: 31 mEq/L — ABNORMAL HIGH (ref 22–29)
Calcium: 9.7 mg/dL (ref 8.4–10.4)
Chloride: 101 mEq/L (ref 98–109)
Creatinine: 0.8 mg/dL (ref 0.6–1.1)
EGFR: 83 mL/min/{1.73_m2} — ABNORMAL LOW (ref >90)
Glucose: 165 mg/dl — ABNORMAL HIGH (ref 70–140)
Potassium: 4.3 mEq/L (ref 3.5–5.1)
Sodium: 141 mEq/L (ref 136–145)
Total Bilirubin: 0.31 mg/dL (ref 0.20–1.20)
Total Protein: 7.1 g/dL (ref 6.4–8.3)

## 2016-07-07 MED ORDER — SODIUM CHLORIDE 0.9 % IJ SOLN
10.0000 mL | INTRAMUSCULAR | Status: DC | PRN
Start: 2016-07-07 — End: 2016-07-07
  Administered 2016-07-07: 10 mL
  Filled 2016-07-07: qty 10

## 2016-07-07 MED ORDER — DIPHENHYDRAMINE HCL 25 MG PO CAPS
ORAL_CAPSULE | ORAL | Status: AC
  Filled 2016-07-07: qty 1

## 2016-07-07 MED ORDER — LORAZEPAM 2 MG/ML IJ SOLN
INTRAMUSCULAR | Status: AC
  Filled 2016-07-07: qty 1

## 2016-07-07 MED ORDER — ACETAMINOPHEN 325 MG PO TABS
650.0000 mg | ORAL_TABLET | Freq: Once | ORAL | Status: AC
  Administered 2016-07-07: 650 mg via ORAL

## 2016-07-07 MED ORDER — DIPHENHYDRAMINE HCL 25 MG PO CAPS
25.0000 mg | ORAL_CAPSULE | Freq: Once | ORAL | Status: AC
  Administered 2016-07-07: 25 mg via ORAL

## 2016-07-07 MED ORDER — TRASTUZUMAB CHEMO 150 MG IV SOLR
900.0000 mg | Freq: Once | INTRAVENOUS | Status: AC
  Administered 2016-07-07: 900 mg via INTRAVENOUS
  Filled 2016-07-07: qty 42.86

## 2016-07-07 MED ORDER — SODIUM CHLORIDE 0.9 % IV SOLN
420.0000 mg | Freq: Once | INTRAVENOUS | Status: AC
  Administered 2016-07-07: 420 mg via INTRAVENOUS
  Filled 2016-07-07: qty 14

## 2016-07-07 MED ORDER — ACETAMINOPHEN 325 MG PO TABS
ORAL_TABLET | ORAL | Status: AC
  Filled 2016-07-07: qty 2

## 2016-07-07 MED ORDER — HEPARIN SOD (PORK) LOCK FLUSH 100 UNIT/ML IV SOLN
500.0000 [IU] | Freq: Once | INTRAVENOUS | Status: AC | PRN
  Administered 2016-07-07: 500 [IU]
  Filled 2016-07-07: qty 5

## 2016-07-07 MED ORDER — SODIUM CHLORIDE 0.9 % IV SOLN
Freq: Once | INTRAVENOUS | Status: AC
  Administered 2016-07-07: 11:00:00 via INTRAVENOUS

## 2016-07-07 MED ORDER — LORAZEPAM 2 MG/ML IJ SOLN: 0.5000 mg | Freq: Once | INTRAMUSCULAR | Status: DC

## 2016-07-07 NOTE — Patient Instructions (Signed)
[facility] Discharge Instructions for Patients Receiving Chemotherapy  Today you received the following chemotherapy agents Herceptin and Perjeta   To help prevent nausea and vomiting after your treatment, we encourage you to take your nausea medication as directed.    If you develop nausea and vomiting that is not controlled by your nausea medication, call the clinic.   BELOW ARE SYMPTOMS THAT SHOULD BE REPORTED IMMEDIATELY:  *FEVER GREATER THAN 100.5 F  *CHILLS WITH OR WITHOUT FEVER  NAUSEA AND VOMITING THAT IS NOT CONTROLLED WITH YOUR NAUSEA MEDICATION  *UNUSUAL SHORTNESS OF BREATH  *UNUSUAL BRUISING OR BLEEDING  TENDERNESS IN MOUTH AND THROAT WITH OR WITHOUT PRESENCE OF ULCERS  *URINARY PROBLEMS  *BOWEL PROBLEMS  UNUSUAL RASH Items with * indicate a potential emergency and should be followed up as soon as possible.  Feel free to call the clinic you have any questions or concerns. The clinic phone number is [phone].  Please show the CHEMO ALERT CARD at check-in to the Emergency Department and triage nurse.   

## 2016-07-08 LAB — CANCER ANTIGEN 27.29: CA 27.29: 14 U/mL (ref 0.0–38.6)

## 2016-07-21 ENCOUNTER — Other Ambulatory Visit: Payer: Self-pay | Admitting: Family Medicine

## 2016-07-23 ENCOUNTER — Other Ambulatory Visit: Payer: Self-pay | Admitting: Family Medicine

## 2016-07-23 MED ORDER — GABAPENTIN 100 MG PO CAPS: 100.0000 mg | ORAL_CAPSULE | Freq: Three times a day (TID) | ORAL | 3 refills | Status: DC

## 2016-07-23 NOTE — Telephone Encounter (Signed)
Pt called and needs a refill on her Gabapentin called in with the new prescription at 3 a day. jw

## 2016-07-25 ENCOUNTER — Telehealth: Payer: Self-pay | Admitting: *Deleted

## 2016-07-25 NOTE — Telephone Encounter (Signed)
Prior Authorization received from [facility] pharmacy for Onetouch test strips. PA completed online at www.covermymeds.com.  PA is pending.  Derl Barrow, RN

## 2016-07-28 ENCOUNTER — Ambulatory Visit (HOSPITAL_BASED_OUTPATIENT_CLINIC_OR_DEPARTMENT_OTHER): Payer: Medicare Other

## 2016-07-28 ENCOUNTER — Other Ambulatory Visit (HOSPITAL_BASED_OUTPATIENT_CLINIC_OR_DEPARTMENT_OTHER): Payer: Medicare Other

## 2016-07-28 VITALS — BP 152/81 | HR 78 | Temp 97.6°F | Resp 20

## 2016-07-28 DIAGNOSIS — Z171 Estrogen receptor negative status [ER-]: Secondary | ICD-10-CM

## 2016-07-28 DIAGNOSIS — C50212 Malignant neoplasm of upper-inner quadrant of left female breast: Secondary | ICD-10-CM

## 2016-07-28 DIAGNOSIS — Z5112 Encounter for antineoplastic immunotherapy: Secondary | ICD-10-CM

## 2016-07-28 DIAGNOSIS — C50912 Malignant neoplasm of unspecified site of left female breast: Secondary | ICD-10-CM | POA: Diagnosis not present

## 2016-07-28 DIAGNOSIS — C7802 Secondary malignant neoplasm of left lung: Secondary | ICD-10-CM

## 2016-07-28 LAB — CBC WITH DIFFERENTIAL/PLATELET
BASO%: 0.1 % (ref 0.0–2.0)
Basophils Absolute: 0 10*3/uL (ref 0.0–0.1)
EOS%: 2.2 % (ref 0.0–7.0)
Eosinophils Absolute: 0.2 10*3/uL (ref 0.0–0.5)
HCT: 42.1 % (ref 34.8–46.6)
HGB: 13.5 g/dL (ref 11.6–15.9)
LYMPH%: 31.1 % (ref 14.0–49.7)
MCH: 27.8 pg (ref 25.1–34.0)
MCHC: 32.1 g/dL (ref 31.5–36.0)
MCV: 86.6 fL (ref 79.5–101.0)
MONO#: 0.4 10*3/uL (ref 0.1–0.9)
MONO%: 5.4 % (ref 0.0–14.0)
NEUT#: 4.5 10*3/uL (ref 1.5–6.5)
NEUT%: 61.2 % (ref 38.4–76.8)
Platelets: 251 10*3/uL (ref 145–400)
RBC: 4.86 10*6/uL (ref 3.70–5.45)
RDW: 14.3 % (ref 11.2–14.5)
WBC: 7.4 10*3/uL (ref 3.9–10.3)
lymph#: 2.3 10*3/uL (ref 0.9–3.3)

## 2016-07-28 LAB — COMPREHENSIVE METABOLIC PANEL
ALT: 28 U/L (ref 0–55)
AST: 16 U/L (ref 5–34)
Albumin: 2.9 g/dL — ABNORMAL LOW (ref 3.5–5.0)
Alkaline Phosphatase: 128 U/L (ref 40–150)
Anion Gap: 9 mEq/L (ref 3–11)
BUN: 21.1 mg/dL (ref 7.0–26.0)
CO2: 32 mEq/L — ABNORMAL HIGH (ref 22–29)
Calcium: 9.7 mg/dL (ref 8.4–10.4)
Chloride: 100 mEq/L (ref 98–109)
Creatinine: 0.8 mg/dL (ref 0.6–1.1)
EGFR: 75 mL/min/{1.73_m2} — ABNORMAL LOW (ref >90)
Glucose: 214 mg/dl — ABNORMAL HIGH (ref 70–140)
Potassium: 4.4 mEq/L (ref 3.5–5.1)
Sodium: 141 mEq/L (ref 136–145)
Total Bilirubin: 0.36 mg/dL (ref 0.20–1.20)
Total Protein: 7.1 g/dL (ref 6.4–8.3)

## 2016-07-28 MED ORDER — DIPHENHYDRAMINE HCL 25 MG PO CAPS
ORAL_CAPSULE | ORAL | Status: AC
  Filled 2016-07-28: qty 1

## 2016-07-28 MED ORDER — LORAZEPAM 2 MG/ML IJ SOLN
INTRAMUSCULAR | Status: AC
  Filled 2016-07-28: qty 1

## 2016-07-28 MED ORDER — SODIUM CHLORIDE 0.9 % IV SOLN
Freq: Once | INTRAVENOUS | Status: AC
  Administered 2016-07-28: 10:00:00 via INTRAVENOUS

## 2016-07-28 MED ORDER — DIPHENHYDRAMINE HCL 25 MG PO CAPS
25.0000 mg | ORAL_CAPSULE | Freq: Once | ORAL | Status: AC
  Administered 2016-07-28: 25 mg via ORAL

## 2016-07-28 MED ORDER — SODIUM CHLORIDE 0.9 % IV SOLN
420.0000 mg | Freq: Once | INTRAVENOUS | Status: AC
  Administered 2016-07-28: 420 mg via INTRAVENOUS
  Filled 2016-07-28: qty 14

## 2016-07-28 MED ORDER — ACETAMINOPHEN 325 MG PO TABS
650.0000 mg | ORAL_TABLET | Freq: Once | ORAL | Status: AC
  Administered 2016-07-28: 650 mg via ORAL

## 2016-07-28 MED ORDER — TRASTUZUMAB CHEMO 150 MG IV SOLR
900.0000 mg | Freq: Once | INTRAVENOUS | Status: AC
  Administered 2016-07-28: 900 mg via INTRAVENOUS
  Filled 2016-07-28: qty 42.86

## 2016-07-28 MED ORDER — ACETAMINOPHEN 325 MG PO TABS
ORAL_TABLET | ORAL | Status: AC
  Filled 2016-07-28: qty 2

## 2016-07-28 MED ORDER — LORAZEPAM 2 MG/ML IJ SOLN: 0.5000 mg | Freq: Once | INTRAMUSCULAR | Status: DC

## 2016-07-28 MED ORDER — SODIUM CHLORIDE 0.9 % IJ SOLN
10.0000 mL | INTRAMUSCULAR | Status: DC | PRN
  Administered 2016-07-28: 10 mL
  Filled 2016-07-28: qty 10

## 2016-07-28 MED ORDER — HEPARIN SOD (PORK) LOCK FLUSH 100 UNIT/ML IV SOLN
500.0000 [IU] | Freq: Once | INTRAVENOUS | Status: AC | PRN
  Administered 2016-07-28: 500 [IU]
  Filled 2016-07-28: qty 5

## 2016-07-28 NOTE — Patient Instructions (Signed)
[facility] Discharge Instructions for Patients Receiving Chemotherapy  Today you received the following chemotherapy agents Herceptin and Perjeta   To help prevent nausea and vomiting after your treatment, we encourage you to take your nausea medication as directed.    If you develop nausea and vomiting that is not controlled by your nausea medication, call the clinic.   BELOW ARE SYMPTOMS THAT SHOULD BE REPORTED IMMEDIATELY:  *FEVER GREATER THAN 100.5 F  *CHILLS WITH OR WITHOUT FEVER  NAUSEA AND VOMITING THAT IS NOT CONTROLLED WITH YOUR NAUSEA MEDICATION  *UNUSUAL SHORTNESS OF BREATH  *UNUSUAL BRUISING OR BLEEDING  TENDERNESS IN MOUTH AND THROAT WITH OR WITHOUT PRESENCE OF ULCERS  *URINARY PROBLEMS  *BOWEL PROBLEMS  UNUSUAL RASH Items with * indicate a potential emergency and should be followed up as soon as possible.  Feel free to call the clinic you have any questions or concerns. The clinic phone number is [phone].  Please show the CHEMO ALERT CARD at check-in to the Emergency Department and triage nurse.   

## 2016-07-28 NOTE — Progress Notes (Signed)
Pt stayed for 10mn observation post perjeta. Asymptomatic upon d/c. AVS printed.

## 2016-07-29 ENCOUNTER — Other Ambulatory Visit: Payer: Self-pay | Admitting: Family Medicine

## 2016-07-29 LAB — CANCER ANTIGEN 27.29: CA 27.29: 19 U/mL (ref 0.0–38.6)

## 2016-08-01 ENCOUNTER — Other Ambulatory Visit: Payer: Self-pay | Admitting: Family Medicine

## 2016-08-01 NOTE — Telephone Encounter (Signed)
Pt called and would like the cream that Dr. Erin Hearing said is like pepper for her knee. Please call in. jw

## 2016-08-01 NOTE — Telephone Encounter (Signed)
PA approved via [employer], vaild 07/27/16. Derl Barrow, RN

## 2016-08-01 NOTE — Telephone Encounter (Signed)
Will forward to MD to advise. Chrles Selley,CMA  

## 2016-08-04 MED ORDER — CAPSAICIN 0.025 % EX CREA: TOPICAL_CREAM | Freq: Four times a day (QID) | CUTANEOUS | 3 refills | Status: DC

## 2016-08-04 NOTE — Telephone Encounter (Signed)
Please let her know  I sent in a Rx  It is OTC and may be cheaper that way instead of by RX  Be very careful and wash hands after use can sting eyes or mouth  Thanks  LC

## 2016-08-04 NOTE — Telephone Encounter (Signed)
Pt informed

## 2016-08-05 ENCOUNTER — Other Ambulatory Visit: Payer: Self-pay | Admitting: Family Medicine

## 2016-08-06 ENCOUNTER — Telehealth: Payer: Self-pay | Admitting: *Deleted

## 2016-08-06 MED ORDER — GABAPENTIN 100 MG PO CAPS
300.0000 mg | ORAL_CAPSULE | Freq: Three times a day (TID) | ORAL | 3 refills | Status: DC
Start: 2016-08-06 — End: 2016-08-20

## 2016-08-06 NOTE — Telephone Encounter (Signed)
Patient states that Dr. Erin Hearing has her taking gabapentin 3 pills three times a day but her current rx has her only taking 1 pill three times a day and she has run out early and pharmacist is saying its is too early to refill. She states PCP needs to resend rx with directions that she is taking 3 pills 3 times a day.

## 2016-08-06 NOTE — Telephone Encounter (Signed)
New Rx sent in

## 2016-08-13 ENCOUNTER — Ambulatory Visit (HOSPITAL_COMMUNITY)
Admission: RE | Admit: 2016-08-13 | Discharge: 2016-08-13 | Disposition: A | Discharge status: Home / Self Care | Payer: Medicare Other | Source: Ambulatory Visit | Attending: Oncology | Admitting: Oncology

## 2016-08-13 DIAGNOSIS — M48061 Spinal stenosis, lumbar region without neurogenic claudication: Secondary | ICD-10-CM | POA: Insufficient documentation

## 2016-08-13 DIAGNOSIS — Z170 Estrogen receptor positive status [ER+]: Secondary | ICD-10-CM

## 2016-08-13 DIAGNOSIS — G4733 Obstructive sleep apnea (adult) (pediatric): Secondary | ICD-10-CM | POA: Insufficient documentation

## 2016-08-13 DIAGNOSIS — Z09 Encounter for follow-up examination after completed treatment for conditions other than malignant neoplasm: Secondary | ICD-10-CM | POA: Insufficient documentation

## 2016-08-13 DIAGNOSIS — C50212 Malignant neoplasm of upper-inner quadrant of left female breast: Secondary | ICD-10-CM | POA: Diagnosis not present

## 2016-08-13 DIAGNOSIS — E119 Type 2 diabetes mellitus without complications: Secondary | ICD-10-CM | POA: Diagnosis not present

## 2016-08-13 DIAGNOSIS — C7802 Secondary malignant neoplasm of left lung: Secondary | ICD-10-CM | POA: Diagnosis not present

## 2016-08-13 DIAGNOSIS — I119 Hypertensive heart disease without heart failure: Secondary | ICD-10-CM | POA: Diagnosis not present

## 2016-08-13 DIAGNOSIS — M47813 Spondylosis without myelopathy or radiculopathy, cervicothoracic region: Secondary | ICD-10-CM | POA: Insufficient documentation

## 2016-08-13 DIAGNOSIS — Z171 Estrogen receptor negative status [ER-]: Secondary | ICD-10-CM | POA: Diagnosis not present

## 2016-08-13 DIAGNOSIS — C50912 Malignant neoplasm of unspecified site of left female breast: Secondary | ICD-10-CM

## 2016-08-20 ENCOUNTER — Encounter: Payer: Self-pay | Admitting: Family Medicine

## 2016-08-20 ENCOUNTER — Ambulatory Visit (INDEPENDENT_AMBULATORY_CARE_PROVIDER_SITE_OTHER): Payer: Medicare Other | Admitting: Family Medicine

## 2016-08-20 VITALS — BP 132/60 | HR 68 | Ht 65.0 in | Wt 312.0 lb

## 2016-08-20 DIAGNOSIS — M79605 Pain in left leg: Secondary | ICD-10-CM

## 2016-08-20 DIAGNOSIS — E1149 Type 2 diabetes mellitus with other diabetic neurological complication: Secondary | ICD-10-CM | POA: Diagnosis not present

## 2016-08-20 LAB — POCT GLYCOSYLATED HEMOGLOBIN (HGB A1C): Hemoglobin A1C: 7.2

## 2016-08-20 MED ORDER — GABAPENTIN 100 MG PO CAPS: 400.0000 mg | ORAL_CAPSULE | Freq: Three times a day (TID) | ORAL | 3 refills | Status: DC

## 2016-08-20 NOTE — Patient Instructions (Addendum)
Good to see you today!  Thanks for coming in.  Your diabetes is doing well continue taking as you are  Your weight is up alittle - work on that - less Reese cups  Move a little more walking every day -   Use the capasacin 4 x a day and the gabapentin three times a day  Get your eye exam  I would recommend a colonoscopy   Come back in 3 months

## 2016-08-20 NOTE — Progress Notes (Signed)
Subjective  Patient is presenting with the following illnesses  DIABETES Disease Monitoring: Blood Sugar ranges(Severity) -not checking  Associated Symptoms- Polyuria/phagia/dipsia- no      Visual problems- no Medications: Compliance(Modifying factor) - daily Hypoglycemic symptoms- no Timing - continuous  Leg Pain Left - slightly better.  Using gabapentin and capsacin.  Hurts a lot to stand and start to walk but then improves some.  No incontinence or weakness  Obesity No improvement.  Worsens her chronic back and new left leg pain.  Cant exercise due to these.  Is trying to watch her intake but was hard over the holidays    Chief Complaint noted Review of Symptoms - see HPI PMH - Smoking status noted.     Objective Vital Signs reviewed In [facility] Able stand and limp to door and return without assistance.    Assessments/Plans  No problem-specific Assessment & Plan notes found for this encounter.   See Encounter view if individual problem A/Ps not visible See after visit summary for details of patient instuctions

## 2016-08-20 NOTE — Assessment & Plan Note (Signed)
Stable

## 2016-08-20 NOTE — Assessment & Plan Note (Signed)
Worsened.  Discussed that diet is the only thing that can improve this

## 2016-08-20 NOTE — Assessment & Plan Note (Addendum)
Slight improvement.  No signs of nerve motor impingement.  Has had imaging by her orthopedist.  Her chronic pain and her obesity make this challenging to improve.  She refuses to do Physical Therapy.  Increase gabapentin and encourage movement

## 2016-08-25 ENCOUNTER — Ambulatory Visit (HOSPITAL_BASED_OUTPATIENT_CLINIC_OR_DEPARTMENT_OTHER): Payer: Medicare Other | Admitting: Oncology

## 2016-08-25 ENCOUNTER — Other Ambulatory Visit (HOSPITAL_BASED_OUTPATIENT_CLINIC_OR_DEPARTMENT_OTHER): Payer: Medicare Other

## 2016-08-25 ENCOUNTER — Ambulatory Visit (HOSPITAL_BASED_OUTPATIENT_CLINIC_OR_DEPARTMENT_OTHER): Payer: Medicare Other

## 2016-08-25 VITALS — BP 161/76 | HR 70 | Temp 97.8°F | Resp 18 | Ht 65.0 in | Wt 314.4 lb

## 2016-08-25 DIAGNOSIS — Z170 Estrogen receptor positive status [ER+]: Secondary | ICD-10-CM

## 2016-08-25 DIAGNOSIS — E1149 Type 2 diabetes mellitus with other diabetic neurological complication: Secondary | ICD-10-CM

## 2016-08-25 DIAGNOSIS — C7802 Secondary malignant neoplasm of left lung: Secondary | ICD-10-CM | POA: Diagnosis not present

## 2016-08-25 DIAGNOSIS — Z5112 Encounter for antineoplastic immunotherapy: Secondary | ICD-10-CM | POA: Diagnosis not present

## 2016-08-25 DIAGNOSIS — I10 Essential (primary) hypertension: Secondary | ICD-10-CM

## 2016-08-25 DIAGNOSIS — C50212 Malignant neoplasm of upper-inner quadrant of left female breast: Secondary | ICD-10-CM

## 2016-08-25 DIAGNOSIS — C50912 Malignant neoplasm of unspecified site of left female breast: Secondary | ICD-10-CM

## 2016-08-25 DIAGNOSIS — E119 Type 2 diabetes mellitus without complications: Secondary | ICD-10-CM | POA: Diagnosis not present

## 2016-08-25 DIAGNOSIS — E1342 Other specified diabetes mellitus with diabetic polyneuropathy: Secondary | ICD-10-CM

## 2016-08-25 LAB — COMPREHENSIVE METABOLIC PANEL
ALT: 25 U/L (ref 0–55)
AST: 15 U/L (ref 5–34)
Albumin: 3.2 g/dL — ABNORMAL LOW (ref 3.5–5.0)
Alkaline Phosphatase: 134 U/L (ref 40–150)
Anion Gap: 7 mEq/L (ref 3–11)
BUN: 17.4 mg/dL (ref 7.0–26.0)
CO2: 34 mEq/L — ABNORMAL HIGH (ref 22–29)
Calcium: 9.9 mg/dL (ref 8.4–10.4)
Chloride: 98 mEq/L (ref 98–109)
Creatinine: 0.8 mg/dL (ref 0.6–1.1)
EGFR: 74 mL/min/{1.73_m2} — ABNORMAL LOW (ref >90)
Glucose: 194 mg/dl — ABNORMAL HIGH (ref 70–140)
Potassium: 4.2 mEq/L (ref 3.5–5.1)
Sodium: 140 mEq/L (ref 136–145)
Total Bilirubin: 0.31 mg/dL (ref 0.20–1.20)
Total Protein: 7.5 g/dL (ref 6.4–8.3)

## 2016-08-25 LAB — CBC WITH DIFFERENTIAL/PLATELET
BASO%: 0.4 % (ref 0.0–2.0)
Basophils Absolute: 0 10*3/uL (ref 0.0–0.1)
EOS%: 1.5 % (ref 0.0–7.0)
Eosinophils Absolute: 0.1 10*3/uL (ref 0.0–0.5)
HCT: 42.8 % (ref 34.8–46.6)
HGB: 14.1 g/dL (ref 11.6–15.9)
LYMPH%: 29 % (ref 14.0–49.7)
MCH: 27.7 pg (ref 25.1–34.0)
MCHC: 33 g/dL (ref 31.5–36.0)
MCV: 83.9 fL (ref 79.5–101.0)
MONO#: 0.4 10*3/uL (ref 0.1–0.9)
MONO%: 5.3 % (ref 0.0–14.0)
NEUT#: 4.4 10*3/uL (ref 1.5–6.5)
NEUT%: 63.8 % (ref 38.4–76.8)
Platelets: 262 10*3/uL (ref 145–400)
RBC: 5.1 10*6/uL (ref 3.70–5.45)
RDW: 14.4 % (ref 11.2–14.5)
WBC: 7 10*3/uL (ref 3.9–10.3)
lymph#: 2 10*3/uL (ref 0.9–3.3)

## 2016-08-25 MED ORDER — SODIUM CHLORIDE 0.9 % IJ SOLN
10.0000 mL | INTRAMUSCULAR | Status: DC | PRN
Start: 2016-08-25 — End: 2016-08-25
  Administered 2016-08-25: 10 mL
  Filled 2016-08-25: qty 10

## 2016-08-25 MED ORDER — ACETAMINOPHEN 325 MG PO TABS
650.0000 mg | ORAL_TABLET | Freq: Once | ORAL | Status: AC
  Administered 2016-08-25: 650 mg via ORAL

## 2016-08-25 MED ORDER — TRASTUZUMAB CHEMO 150 MG IV SOLR
6.0000 mg/kg | Freq: Once | INTRAVENOUS | Status: AC
  Administered 2016-08-25: 903 mg via INTRAVENOUS
  Filled 2016-08-25: qty 43

## 2016-08-25 MED ORDER — HEPARIN SOD (PORK) LOCK FLUSH 100 UNIT/ML IV SOLN
500.0000 [IU] | Freq: Once | INTRAVENOUS | Status: AC | PRN
  Administered 2016-08-25: 500 [IU]
  Filled 2016-08-25: qty 5

## 2016-08-25 MED ORDER — DIPHENHYDRAMINE HCL 25 MG PO CAPS
25.0000 mg | ORAL_CAPSULE | Freq: Once | ORAL | Status: AC
Start: 2016-08-25 — End: 2016-08-25
  Administered 2016-08-25: 25 mg via ORAL

## 2016-08-25 MED ORDER — DIPHENHYDRAMINE HCL 25 MG PO CAPS
ORAL_CAPSULE | ORAL | Status: AC
  Filled 2016-08-25: qty 1

## 2016-08-25 MED ORDER — SODIUM CHLORIDE 0.9 % IV SOLN
Freq: Once | INTRAVENOUS | Status: AC
  Administered 2016-08-25: 11:00:00 via INTRAVENOUS

## 2016-08-25 MED ORDER — LORAZEPAM 2 MG/ML IJ SOLN: 0.5000 mg | Freq: Once | INTRAMUSCULAR | Status: DC

## 2016-08-25 MED ORDER — ACETAMINOPHEN 325 MG PO TABS
ORAL_TABLET | ORAL | Status: AC
  Filled 2016-08-25: qty 2

## 2016-08-25 NOTE — Patient Instructions (Signed)
Trastuzumab injection for infusion What is this medicine? TRASTUZUMAB (tras TOO zoo mab) is a monoclonal antibody. It is used to treat breast cancer and stomach cancer. This medicine may be used for other purposes; ask your health care provider or pharmacist if you have questions. COMMON BRAND NAME(S): Herceptin What should I tell my health care provider before I take this medicine? They need to know if you have any of these conditions: -heart disease -heart failure -infection (especially a virus infection such as chickenpox, cold sores, or herpes) -lung or breathing disease, like asthma -recent or ongoing radiation therapy -an unusual or allergic reaction to trastuzumab, benzyl alcohol, or other medications, foods, dyes, or preservatives -pregnant or trying to get pregnant -breast-feeding How should I use this medicine? This drug is given as an infusion into a vein. It is administered in a hospital or clinic by a specially trained health care professional. Talk to your pediatrician regarding the use of this medicine in children. This medicine is not approved for use in children. Overdosage: If you think you have taken too much of this medicine contact a poison control center or emergency room at once. NOTE: This medicine is only for you. Do not share this medicine with others. What if I miss a dose? It is important not to miss a dose. Call your doctor or health care professional if you are unable to keep an appointment. What may interact with this medicine? -doxorubicin -warfarin This list may not describe all possible interactions. Give your health care provider a list of all the medicines, herbs, non-prescription drugs, or dietary supplements you use. Also tell them if you smoke, drink alcohol, or use illegal drugs. Some items may interact with your medicine. What should I watch for while using this medicine? Visit your doctor for checks on your progress. Report any side effects.  Continue your course of treatment even though you feel ill unless your doctor tells you to stop. Call your doctor or health care professional for advice if you get a fever, chills or sore throat, or other symptoms of a cold or flu. Do not treat yourself. Try to avoid being around people who are sick. You may experience fever, chills and shaking during your first infusion. These effects are usually mild and can be treated with other medicines. Report any side effects during the infusion to your health care professional. Fever and chills usually do not happen with later infusions. Do not become pregnant while taking this medicine or for 7 months after stopping it. Women should inform their doctor if they wish to become pregnant or think they might be pregnant. Women of child-bearing potential will need to have a negative pregnancy test before starting this medicine. There is a potential for serious side effects to an unborn child. Talk to your health care professional or pharmacist for more information. Do not breast-feed an infant while taking this medicine or for 7 months after stopping it. Women must use effective birth control with this medicine. What side effects may I notice from receiving this medicine? Side effects that you should report to your doctor or health care professional as soon as possible: -breathing difficulties -chest pain or palpitations -cough -dizziness or fainting -fever or chills, sore throat -skin rash, itching or hives -swelling of the legs or ankles -unusually weak or tired Side effects that usually do not require medical attention (report to your doctor or health care professional if they continue or are bothersome): -loss of appetite -headache -muscle aches -  nausea This list may not describe all possible side effects. Call your doctor for medical advice about side effects. You may report side effects to FDA at 1-800-FDA-1088. Where should I keep my medicine? This  drug is given in a hospital or clinic and will not be stored at home. NOTE: This sheet is a summary. It may not cover all possible information. If you have questions about this medicine, talk to your doctor, pharmacist, or health care provider.  2017 Elsevier/Gold Standard (2015-09-05 17:16:44)

## 2016-08-25 NOTE — Progress Notes (Signed)
Patient not receiving Perjeta today, every 4 week treatment of Herceptin only, per Dr. Jana Hakim.
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
## 2016-08-26 LAB — CANCER ANTIGEN 27.29: CA 27.29: 20.9 U/mL (ref 0.0–38.6)

## 2016-08-28 ENCOUNTER — Ambulatory Visit (HOSPITAL_COMMUNITY)
Admission: RE | Admit: 2016-08-28 | Discharge: 2016-08-28 | Disposition: A | Discharge status: Home / Self Care | Payer: Medicare Other | Source: Ambulatory Visit | Attending: Oncology | Admitting: Oncology

## 2016-08-28 DIAGNOSIS — M47813 Spondylosis without myelopathy or radiculopathy, cervicothoracic region: Secondary | ICD-10-CM | POA: Diagnosis not present

## 2016-08-28 DIAGNOSIS — Z170 Estrogen receptor positive status [ER+]: Secondary | ICD-10-CM | POA: Diagnosis not present

## 2016-08-28 DIAGNOSIS — C50212 Malignant neoplasm of upper-inner quadrant of left female breast: Secondary | ICD-10-CM | POA: Insufficient documentation

## 2016-08-28 DIAGNOSIS — C50912 Malignant neoplasm of unspecified site of left female breast: Secondary | ICD-10-CM | POA: Diagnosis not present

## 2016-08-28 DIAGNOSIS — Z171 Estrogen receptor negative status [ER-]: Secondary | ICD-10-CM | POA: Diagnosis not present

## 2016-08-28 DIAGNOSIS — C7801 Secondary malignant neoplasm of right lung: Secondary | ICD-10-CM | POA: Diagnosis not present

## 2016-08-28 DIAGNOSIS — C7802 Secondary malignant neoplasm of left lung: Secondary | ICD-10-CM | POA: Diagnosis not present

## 2016-08-28 DIAGNOSIS — M48061 Spinal stenosis, lumbar region without neurogenic claudication: Secondary | ICD-10-CM | POA: Diagnosis not present

## 2016-08-28 DIAGNOSIS — C50919 Malignant neoplasm of unspecified site of unspecified female breast: Secondary | ICD-10-CM | POA: Diagnosis not present

## 2016-08-28 MED ORDER — IOPAMIDOL (ISOVUE-300) INJECTION 61%
75.0000 mL | Freq: Once | INTRAVENOUS | Status: AC | PRN
  Administered 2016-08-28: 75 mL via INTRAVENOUS

## 2016-08-28 MED ORDER — IOPAMIDOL (ISOVUE-300) INJECTION 61%
INTRAVENOUS | Status: AC
  Filled 2016-08-28: qty 75

## 2016-08-29 ENCOUNTER — Encounter: Payer: Self-pay | Admitting: Oncology

## 2016-08-31 DIAGNOSIS — I5020 Unspecified systolic (congestive) heart failure: Secondary | ICD-10-CM | POA: Diagnosis not present

## 2016-09-02 ENCOUNTER — Telehealth: Payer: Self-pay | Admitting: Oncology

## 2016-09-02 NOTE — Telephone Encounter (Signed)
Mailed records to [facility] risk adjustment

## 2016-09-17 ENCOUNTER — Telehealth: Payer: Self-pay

## 2016-09-17 NOTE — Telephone Encounter (Signed)
Sharde with [employer] asking for [facility]. She was asking for total dosage of herceptin given on 6/20, 7/11 and 9/18 and if single dose or multidose vials were used.  S/w Arbie Cookey PharmD 903 mg were given each time. Multidose vial was used on 6/20, and single dose vials were used on 7/11 and 9/18  Called Sharde back and gave her this information.

## 2016-09-20 ENCOUNTER — Other Ambulatory Visit: Payer: Self-pay | Admitting: Oncology

## 2016-09-22 ENCOUNTER — Other Ambulatory Visit: Payer: Self-pay | Admitting: *Deleted

## 2016-09-22 ENCOUNTER — Ambulatory Visit (HOSPITAL_BASED_OUTPATIENT_CLINIC_OR_DEPARTMENT_OTHER): Payer: Medicare Other

## 2016-09-22 ENCOUNTER — Other Ambulatory Visit (HOSPITAL_BASED_OUTPATIENT_CLINIC_OR_DEPARTMENT_OTHER): Payer: Medicare Other

## 2016-09-22 ENCOUNTER — Other Ambulatory Visit: Payer: Self-pay | Admitting: Hematology and Oncology

## 2016-09-22 DIAGNOSIS — C7802 Secondary malignant neoplasm of left lung: Secondary | ICD-10-CM

## 2016-09-22 DIAGNOSIS — C50912 Malignant neoplasm of unspecified site of left female breast: Secondary | ICD-10-CM

## 2016-09-22 DIAGNOSIS — C50212 Malignant neoplasm of upper-inner quadrant of left female breast: Secondary | ICD-10-CM

## 2016-09-22 DIAGNOSIS — Z5112 Encounter for antineoplastic immunotherapy: Secondary | ICD-10-CM

## 2016-09-22 DIAGNOSIS — M48061 Spinal stenosis, lumbar region without neurogenic claudication: Secondary | ICD-10-CM

## 2016-09-22 DIAGNOSIS — Z170 Estrogen receptor positive status [ER+]: Secondary | ICD-10-CM

## 2016-09-22 DIAGNOSIS — M47813 Spondylosis without myelopathy or radiculopathy, cervicothoracic region: Secondary | ICD-10-CM

## 2016-09-22 LAB — COMPREHENSIVE METABOLIC PANEL
ALT: 54 U/L (ref 0–55)
AST: 37 U/L — ABNORMAL HIGH (ref 5–34)
Albumin: 2.9 g/dL — ABNORMAL LOW (ref 3.5–5.0)
Alkaline Phosphatase: 168 U/L — ABNORMAL HIGH (ref 40–150)
Anion Gap: 8 mEq/L (ref 3–11)
BUN: 15.6 mg/dL (ref 7.0–26.0)
CO2: 31 mEq/L — ABNORMAL HIGH (ref 22–29)
Calcium: 9.6 mg/dL (ref 8.4–10.4)
Chloride: 100 mEq/L (ref 98–109)
Creatinine: 0.8 mg/dL (ref 0.6–1.1)
EGFR: 80 mL/min/{1.73_m2} — ABNORMAL LOW (ref >90)
Glucose: 273 mg/dl — ABNORMAL HIGH (ref 70–140)
Potassium: 4.1 mEq/L (ref 3.5–5.1)
Sodium: 139 mEq/L (ref 136–145)
Total Bilirubin: 0.27 mg/dL (ref 0.20–1.20)
Total Protein: 6.9 g/dL (ref 6.4–8.3)

## 2016-09-22 LAB — CBC WITH DIFFERENTIAL/PLATELET
BASO%: 0.1 % (ref 0.0–2.0)
Basophils Absolute: 0 10*3/uL (ref 0.0–0.1)
EOS%: 1 % (ref 0.0–7.0)
Eosinophils Absolute: 0.1 10*3/uL (ref 0.0–0.5)
HCT: 41.5 % (ref 34.8–46.6)
HGB: 12.9 g/dL (ref 11.6–15.9)
LYMPH%: 25.4 % (ref 14.0–49.7)
MCH: 27.2 pg (ref 25.1–34.0)
MCHC: 31.1 g/dL — ABNORMAL LOW (ref 31.5–36.0)
MCV: 87.6 fL (ref 79.5–101.0)
MONO#: 0.3 10*3/uL (ref 0.1–0.9)
MONO%: 4.8 % (ref 0.0–14.0)
NEUT#: 4.7 10*3/uL (ref 1.5–6.5)
NEUT%: 68.7 % (ref 38.4–76.8)
Platelets: 233 10*3/uL (ref 145–400)
RBC: 4.74 10*6/uL (ref 3.70–5.45)
RDW: 14.3 % (ref 11.2–14.5)
WBC: 6.9 10*3/uL (ref 3.9–10.3)
lymph#: 1.7 10*3/uL (ref 0.9–3.3)

## 2016-09-22 MED ORDER — DIPHENHYDRAMINE HCL 25 MG PO CAPS
ORAL_CAPSULE | ORAL | Status: AC
  Filled 2016-09-22: qty 2

## 2016-09-22 MED ORDER — ACETAMINOPHEN 325 MG PO TABS
650.0000 mg | ORAL_TABLET | Freq: Once | ORAL | Status: AC
  Administered 2016-09-22: 650 mg via ORAL

## 2016-09-22 MED ORDER — TRASTUZUMAB CHEMO 150 MG IV SOLR: 6.0000 mg/kg | Freq: Once | INTRAVENOUS | Status: DC

## 2016-09-22 MED ORDER — OXYCODONE HCL ER 10 MG PO T12A: 10.0000 mg | EXTENDED_RELEASE_TABLET | Freq: Two times a day (BID) | ORAL | 0 refills | Status: DC

## 2016-09-22 MED ORDER — SODIUM CHLORIDE 0.9 % IJ SOLN
10.0000 mL | INTRAMUSCULAR | Status: DC | PRN
  Administered 2016-09-22: 10 mL
  Filled 2016-09-22: qty 10

## 2016-09-22 MED ORDER — ACETAMINOPHEN 325 MG PO TABS
ORAL_TABLET | ORAL | Status: AC
  Filled 2016-09-22: qty 2

## 2016-09-22 MED ORDER — HEPARIN SOD (PORK) LOCK FLUSH 100 UNIT/ML IV SOLN
500.0000 [IU] | Freq: Once | INTRAVENOUS | Status: AC | PRN
  Administered 2016-09-22: 500 [IU]
  Filled 2016-09-22: qty 5

## 2016-09-22 MED ORDER — SODIUM CHLORIDE 0.9 % IV SOLN
Freq: Once | INTRAVENOUS | Status: AC
  Administered 2016-09-22: 11:00:00 via INTRAVENOUS

## 2016-09-22 MED ORDER — LORAZEPAM 2 MG/ML IJ SOLN: 0.5000 mg | Freq: Once | INTRAMUSCULAR | Status: DC

## 2016-09-22 MED ORDER — DIPHENHYDRAMINE HCL 25 MG PO CAPS
25.0000 mg | ORAL_CAPSULE | Freq: Once | ORAL | Status: AC
  Administered 2016-09-22: 25 mg via ORAL

## 2016-09-22 MED ORDER — TRASTUZUMAB CHEMO 150 MG IV SOLR
440.0000 mg | Freq: Once | INTRAVENOUS | Status: AC
  Administered 2016-09-22: 440 mg via INTRAVENOUS
  Filled 2016-09-22: qty 20.95

## 2016-09-22 NOTE — Patient Instructions (Signed)
Trastuzumab injection for infusion What is this medicine? TRASTUZUMAB (tras TOO zoo mab) is a monoclonal antibody. It is used to treat breast cancer and stomach cancer. This medicine may be used for other purposes; ask your health care provider or pharmacist if you have questions. COMMON BRAND NAME(S): Herceptin What should I tell my health care provider before I take this medicine? They need to know if you have any of these conditions: -heart disease -heart failure -infection (especially a virus infection such as chickenpox, cold sores, or herpes) -lung or breathing disease, like asthma -recent or ongoing radiation therapy -an unusual or allergic reaction to trastuzumab, benzyl alcohol, or other medications, foods, dyes, or preservatives -pregnant or trying to get pregnant -breast-feeding How should I use this medicine? This drug is given as an infusion into a vein. It is administered in a hospital or clinic by a specially trained health care professional. Talk to your pediatrician regarding the use of this medicine in children. This medicine is not approved for use in children. Overdosage: If you think you have taken too much of this medicine contact a poison control center or emergency room at once. NOTE: This medicine is only for you. Do not share this medicine with others. What if I miss a dose? It is important not to miss a dose. Call your doctor or health care professional if you are unable to keep an appointment. What may interact with this medicine? -doxorubicin -warfarin This list may not describe all possible interactions. Give your health care provider a list of all the medicines, herbs, non-prescription drugs, or dietary supplements you use. Also tell them if you smoke, drink alcohol, or use illegal drugs. Some items may interact with your medicine. What should I watch for while using this medicine? Visit your doctor for checks on your progress. Report any side effects.  Continue your course of treatment even though you feel ill unless your doctor tells you to stop. Call your doctor or health care professional for advice if you get a fever, chills or sore throat, or other symptoms of a cold or flu. Do not treat yourself. Try to avoid being around people who are sick. You may experience fever, chills and shaking during your first infusion. These effects are usually mild and can be treated with other medicines. Report any side effects during the infusion to your health care professional. Fever and chills usually do not happen with later infusions. Do not become pregnant while taking this medicine or for 7 months after stopping it. Women should inform their doctor if they wish to become pregnant or think they might be pregnant. Women of child-bearing potential will need to have a negative pregnancy test before starting this medicine. There is a potential for serious side effects to an unborn child. Talk to your health care professional or pharmacist for more information. Do not breast-feed an infant while taking this medicine or for 7 months after stopping it. Women must use effective birth control with this medicine. What side effects may I notice from receiving this medicine? Side effects that you should report to your doctor or health care professional as soon as possible: -breathing difficulties -chest pain or palpitations -cough -dizziness or fainting -fever or chills, sore throat -skin rash, itching or hives -swelling of the legs or ankles -unusually weak or tired Side effects that usually do not require medical attention (report to your doctor or health care professional if they continue or are bothersome): -loss of appetite -headache -muscle aches -  nausea This list may not describe all possible side effects. Call your doctor for medical advice about side effects. You may report side effects to FDA at 1-800-FDA-1088. Where should I keep my medicine? This  drug is given in a hospital or clinic and will not be stored at home. NOTE: This sheet is a summary. It may not cover all possible information. If you have questions about this medicine, talk to your doctor, pharmacist, or health care provider.  2017 Elsevier/Gold Standard (2015-09-05 17:16:44)

## 2016-09-23 LAB — CANCER ANTIGEN 27.29: CA 27.29: 17.2 U/mL (ref 0.0–38.6)

## 2016-09-24 NOTE — Telephone Encounter (Signed)
Chart reviewed.

## 2016-10-01 ENCOUNTER — Other Ambulatory Visit: Payer: Self-pay | Admitting: Oncology

## 2016-10-01 DIAGNOSIS — I5020 Unspecified systolic (congestive) heart failure: Secondary | ICD-10-CM | POA: Diagnosis not present

## 2016-10-10 ENCOUNTER — Telehealth: Payer: Self-pay | Admitting: Family Medicine

## 2016-10-10 NOTE — Telephone Encounter (Signed)
Pt was not interested in scheduling AWV since she has frequent oncology appts. Reports she'll talk to Dr. Erin Hearing if she has any concerns. -Mesha Guinyard

## 2016-10-20 ENCOUNTER — Ambulatory Visit (HOSPITAL_COMMUNITY)
Admission: RE | Admit: 2016-10-20 | Discharge: 2016-10-20 | Disposition: A | Discharge status: Home / Self Care | Payer: Medicare Other | Source: Ambulatory Visit | Attending: Oncology | Admitting: Oncology

## 2016-10-20 ENCOUNTER — Other Ambulatory Visit: Payer: Self-pay | Admitting: *Deleted

## 2016-10-20 ENCOUNTER — Telehealth: Payer: Self-pay | Admitting: *Deleted

## 2016-10-20 ENCOUNTER — Other Ambulatory Visit (HOSPITAL_BASED_OUTPATIENT_CLINIC_OR_DEPARTMENT_OTHER): Payer: Medicare Other

## 2016-10-20 ENCOUNTER — Ambulatory Visit (HOSPITAL_BASED_OUTPATIENT_CLINIC_OR_DEPARTMENT_OTHER): Payer: Medicare Other

## 2016-10-20 VITALS — BP 134/68 | HR 66 | Temp 98.3°F | Resp 20

## 2016-10-20 DIAGNOSIS — Z170 Estrogen receptor positive status [ER+]: Secondary | ICD-10-CM | POA: Insufficient documentation

## 2016-10-20 DIAGNOSIS — Z5112 Encounter for antineoplastic immunotherapy: Secondary | ICD-10-CM | POA: Diagnosis not present

## 2016-10-20 DIAGNOSIS — C7802 Secondary malignant neoplasm of left lung: Principal | ICD-10-CM

## 2016-10-20 DIAGNOSIS — C50212 Malignant neoplasm of upper-inner quadrant of left female breast: Secondary | ICD-10-CM

## 2016-10-20 DIAGNOSIS — C50912 Malignant neoplasm of unspecified site of left female breast: Secondary | ICD-10-CM

## 2016-10-20 DIAGNOSIS — R1084 Generalized abdominal pain: Secondary | ICD-10-CM | POA: Diagnosis not present

## 2016-10-20 DIAGNOSIS — R1013 Epigastric pain: Secondary | ICD-10-CM | POA: Diagnosis not present

## 2016-10-20 DIAGNOSIS — M47813 Spondylosis without myelopathy or radiculopathy, cervicothoracic region: Secondary | ICD-10-CM

## 2016-10-20 DIAGNOSIS — K5903 Drug induced constipation: Secondary | ICD-10-CM | POA: Insufficient documentation

## 2016-10-20 DIAGNOSIS — M48061 Spinal stenosis, lumbar region without neurogenic claudication: Secondary | ICD-10-CM

## 2016-10-20 LAB — CBC WITH DIFFERENTIAL/PLATELET
BASO%: 0.2 % (ref 0.0–2.0)
Basophils Absolute: 0 10*3/uL (ref 0.0–0.1)
EOS%: 1.5 % (ref 0.0–7.0)
Eosinophils Absolute: 0.1 10*3/uL (ref 0.0–0.5)
HCT: 40.8 % (ref 34.8–46.6)
HGB: 13.2 g/dL (ref 11.6–15.9)
LYMPH%: 28.2 % (ref 14.0–49.7)
MCH: 27 pg (ref 25.1–34.0)
MCHC: 32.5 g/dL (ref 31.5–36.0)
MCV: 83.2 fL (ref 79.5–101.0)
MONO#: 0.4 10*3/uL (ref 0.1–0.9)
MONO%: 6.7 % (ref 0.0–14.0)
NEUT#: 3.9 10*3/uL (ref 1.5–6.5)
NEUT%: 63.4 % (ref 38.4–76.8)
Platelets: 235 10*3/uL (ref 145–400)
RBC: 4.91 10*6/uL (ref 3.70–5.45)
RDW: 15.2 % — ABNORMAL HIGH (ref 11.2–14.5)
WBC: 6.2 10*3/uL (ref 3.9–10.3)
lymph#: 1.7 10*3/uL (ref 0.9–3.3)

## 2016-10-20 LAB — COMPREHENSIVE METABOLIC PANEL
ALT: 31 U/L (ref 0–55)
AST: 18 U/L (ref 5–34)
Albumin: 3.2 g/dL — ABNORMAL LOW (ref 3.5–5.0)
Alkaline Phosphatase: 124 U/L (ref 40–150)
Anion Gap: 6 mEq/L (ref 3–11)
BUN: 17.9 mg/dL (ref 7.0–26.0)
CO2: 35 mEq/L — ABNORMAL HIGH (ref 22–29)
Calcium: 9.8 mg/dL (ref 8.4–10.4)
Chloride: 100 mEq/L (ref 98–109)
Creatinine: 0.7 mg/dL (ref 0.6–1.1)
EGFR: 84 mL/min/{1.73_m2} — ABNORMAL LOW (ref >90)
Glucose: 130 mg/dl (ref 70–140)
Potassium: 4.1 mEq/L (ref 3.5–5.1)
Sodium: 141 mEq/L (ref 136–145)
Total Bilirubin: 0.33 mg/dL (ref 0.20–1.20)
Total Protein: 7.3 g/dL (ref 6.4–8.3)

## 2016-10-20 MED ORDER — HEPARIN SOD (PORK) LOCK FLUSH 100 UNIT/ML IV SOLN
500.0000 [IU] | Freq: Once | INTRAVENOUS | Status: AC | PRN
  Administered 2016-10-20: 500 [IU]
  Filled 2016-10-20: qty 5

## 2016-10-20 MED ORDER — SODIUM CHLORIDE 0.9 % IV SOLN
Freq: Once | INTRAVENOUS | Status: AC
  Administered 2016-10-20: 12:00:00 via INTRAVENOUS

## 2016-10-20 MED ORDER — DIPHENHYDRAMINE HCL 25 MG PO CAPS
ORAL_CAPSULE | ORAL | Status: AC
  Filled 2016-10-20: qty 1

## 2016-10-20 MED ORDER — FAMOTIDINE 20 MG PO TABS: 20.0000 mg | ORAL_TABLET | Freq: Two times a day (BID) | ORAL | 3 refills | Status: DC

## 2016-10-20 MED ORDER — LORAZEPAM 2 MG/ML IJ SOLN: 0.5000 mg | Freq: Once | INTRAMUSCULAR | Status: DC

## 2016-10-20 MED ORDER — TRASTUZUMAB CHEMO 150 MG IV SOLR
6.0000 mg/kg | Freq: Once | INTRAVENOUS | Status: AC
  Administered 2016-10-20: 903 mg via INTRAVENOUS
  Filled 2016-10-20: qty 43

## 2016-10-20 MED ORDER — SODIUM CHLORIDE 0.9 % IJ SOLN
10.0000 mL | INTRAMUSCULAR | Status: DC | PRN
  Administered 2016-10-20: 10 mL
  Filled 2016-10-20: qty 10

## 2016-10-20 MED ORDER — DIPHENHYDRAMINE HCL 25 MG PO CAPS
25.0000 mg | ORAL_CAPSULE | Freq: Once | ORAL | Status: AC
  Administered 2016-10-20: 25 mg via ORAL

## 2016-10-20 MED ORDER — OXYCODONE HCL ER 10 MG PO T12A: 10.0000 mg | EXTENDED_RELEASE_TABLET | Freq: Two times a day (BID) | ORAL | 0 refills | Status: DC

## 2016-10-20 MED ORDER — ACETAMINOPHEN 325 MG PO TABS
ORAL_TABLET | ORAL | Status: AC
  Filled 2016-10-20: qty 2

## 2016-10-20 MED ORDER — ACETAMINOPHEN 325 MG PO TABS
650.0000 mg | ORAL_TABLET | Freq: Once | ORAL | Status: AC
  Administered 2016-10-20: 650 mg via ORAL

## 2016-10-20 NOTE — Progress Notes (Signed)
1200: Pt reports new onset of epigastric abdominal pain x 3 weeks.  States pain began after straining for BM, rates 5/10, described as achey discomfort, constant.  States nothing improves pain, reports pain worse if she eats a large meal.  Denies N/V/D.  Reports constipation with LBM 10/19/16.  Reports she normally has some diarrhea following herceptin.  Val, Dr. Virgie Dad RN, notified.  Orders placed for abdominal xray.  Pt to go to radiology for xray after treatment and come back to see Val afterwards for results and refill on pain meds. Pt verbalized understanding.

## 2016-10-20 NOTE — Telephone Encounter (Signed)
Per discussion post KUB pt states she has had ongoing upper epigastric ( puts hand on middle of upper abd below the sternum ) discomfort " that feels like a pulled muscle ".  She states discomfort started approximately 2 weeks ago " and I have been terribly constipated and bear down a lot and just wonder if that is part of the problem "  Per above and results of KUB pt will institute pepcid for gastric discomfort.  She normally has diarrhea post herceptin " for about a week "  This RN informed pt that she should start stool softner once diarrhea resolves to keep her stools soft and moving.  Pepcid sent to pharmacy.

## 2016-10-20 NOTE — Patient Instructions (Signed)
[facility] Discharge Instructions for Patients Receiving Chemotherapy  Today you received the following: Herceptin.  To help prevent nausea and vomiting after your treatment, we encourage you to take your nausea medication as needed.   If you develop nausea and vomiting that is not controlled by your nausea medication, call the clinic.   BELOW ARE SYMPTOMS THAT SHOULD BE REPORTED IMMEDIATELY:  *FEVER GREATER THAN 100.5 F  *CHILLS WITH OR WITHOUT FEVER  NAUSEA AND VOMITING THAT IS NOT CONTROLLED WITH YOUR NAUSEA MEDICATION  *UNUSUAL SHORTNESS OF BREATH  *UNUSUAL BRUISING OR BLEEDING  TENDERNESS IN MOUTH AND THROAT WITH OR WITHOUT PRESENCE OF ULCERS  *URINARY PROBLEMS  *BOWEL PROBLEMS  UNUSUAL RASH Items with * indicate a potential emergency and should be followed up as soon as possible.  Feel free to call the clinic you have any questions or concerns. The clinic phone number is [phone].  Please show the [place] at check-in to the Emergency Department and triage nurse.

## 2016-10-21 LAB — CANCER ANTIGEN 27.29: CA 27.29: 18 U/mL (ref 0.0–38.6)

## 2016-10-28 ENCOUNTER — Other Ambulatory Visit: Payer: Self-pay | Admitting: *Deleted

## 2016-10-28 ENCOUNTER — Encounter: Payer: Self-pay | Admitting: Oncology

## 2016-10-28 MED FILL — OxyCONTIN 10 MG T12A: 10 | 30 days supply | Qty: 60 | Fill #0

## 2016-10-28 NOTE — Progress Notes (Signed)
Received PA request for Oxycontin. Printed questionnaire from [employer] and gave to [employer] to complete and return.

## 2016-10-29 DIAGNOSIS — I5020 Unspecified systolic (congestive) heart failure: Secondary | ICD-10-CM | POA: Diagnosis not present

## 2016-11-12 ENCOUNTER — Encounter: Payer: Self-pay | Admitting: Oncology

## 2016-11-12 NOTE — Progress Notes (Signed)
Received denial for Oxycontin from [employer]. Gave to RN.

## 2016-11-13 ENCOUNTER — Encounter: Payer: Self-pay | Admitting: Oncology

## 2016-11-17 ENCOUNTER — Other Ambulatory Visit (HOSPITAL_BASED_OUTPATIENT_CLINIC_OR_DEPARTMENT_OTHER): Payer: Medicare Other

## 2016-11-17 ENCOUNTER — Ambulatory Visit (HOSPITAL_BASED_OUTPATIENT_CLINIC_OR_DEPARTMENT_OTHER): Payer: Medicare Other

## 2016-11-17 ENCOUNTER — Other Ambulatory Visit: Payer: Self-pay | Admitting: *Deleted

## 2016-11-17 VITALS — BP 147/55 | HR 73 | Temp 98.7°F | Resp 20

## 2016-11-17 DIAGNOSIS — C7802 Secondary malignant neoplasm of left lung: Secondary | ICD-10-CM | POA: Diagnosis not present

## 2016-11-17 DIAGNOSIS — M48061 Spinal stenosis, lumbar region without neurogenic claudication: Secondary | ICD-10-CM

## 2016-11-17 DIAGNOSIS — M47813 Spondylosis without myelopathy or radiculopathy, cervicothoracic region: Secondary | ICD-10-CM

## 2016-11-17 DIAGNOSIS — C50912 Malignant neoplasm of unspecified site of left female breast: Secondary | ICD-10-CM | POA: Diagnosis not present

## 2016-11-17 DIAGNOSIS — C50212 Malignant neoplasm of upper-inner quadrant of left female breast: Secondary | ICD-10-CM | POA: Diagnosis not present

## 2016-11-17 DIAGNOSIS — Z170 Estrogen receptor positive status [ER+]: Secondary | ICD-10-CM

## 2016-11-17 DIAGNOSIS — Z5112 Encounter for antineoplastic immunotherapy: Secondary | ICD-10-CM

## 2016-11-17 LAB — CBC WITH DIFFERENTIAL/PLATELET
BASO%: 0.3 % (ref 0.0–2.0)
Basophils Absolute: 0 10*3/uL (ref 0.0–0.1)
EOS%: 1.1 % (ref 0.0–7.0)
Eosinophils Absolute: 0.1 10*3/uL (ref 0.0–0.5)
HCT: 40.1 % (ref 34.8–46.6)
HGB: 13.2 g/dL (ref 11.6–15.9)
LYMPH%: 25.1 % (ref 14.0–49.7)
MCH: 27.3 pg (ref 25.1–34.0)
MCHC: 32.9 g/dL (ref 31.5–36.0)
MCV: 82.9 fL (ref 79.5–101.0)
MONO#: 0.5 10*3/uL (ref 0.1–0.9)
MONO%: 6.5 % (ref 0.0–14.0)
NEUT#: 4.7 10*3/uL (ref 1.5–6.5)
NEUT%: 67 % (ref 38.4–76.8)
Platelets: 239 10*3/uL (ref 145–400)
RBC: 4.83 10*6/uL (ref 3.70–5.45)
RDW: 15.5 % — ABNORMAL HIGH (ref 11.2–14.5)
WBC: 7.1 10*3/uL (ref 3.9–10.3)
lymph#: 1.8 10*3/uL (ref 0.9–3.3)

## 2016-11-17 LAB — COMPREHENSIVE METABOLIC PANEL
ALT: 32 U/L (ref 0–55)
AST: 20 U/L (ref 5–34)
Albumin: 3.2 g/dL — ABNORMAL LOW (ref 3.5–5.0)
Alkaline Phosphatase: 117 U/L (ref 40–150)
Anion Gap: 8 mEq/L (ref 3–11)
BUN: 14.9 mg/dL (ref 7.0–26.0)
CO2: 32 mEq/L — ABNORMAL HIGH (ref 22–29)
Calcium: 9.6 mg/dL (ref 8.4–10.4)
Chloride: 102 mEq/L (ref 98–109)
Creatinine: 0.8 mg/dL (ref 0.6–1.1)
EGFR: 83 mL/min/{1.73_m2} — ABNORMAL LOW (ref >90)
Glucose: 178 mg/dl — ABNORMAL HIGH (ref 70–140)
Potassium: 4.1 mEq/L (ref 3.5–5.1)
Sodium: 142 mEq/L (ref 136–145)
Total Bilirubin: 0.31 mg/dL (ref 0.20–1.20)
Total Protein: 7.2 g/dL (ref 6.4–8.3)

## 2016-11-17 MED ORDER — ACETAMINOPHEN 325 MG PO TABS
650.0000 mg | ORAL_TABLET | Freq: Once | ORAL | Status: AC
  Administered 2016-11-17: 650 mg via ORAL

## 2016-11-17 MED ORDER — OXYCODONE HCL ER 10 MG PO T12A: 10.0000 mg | EXTENDED_RELEASE_TABLET | Freq: Two times a day (BID) | ORAL | 0 refills | Status: DC

## 2016-11-17 MED ORDER — DIPHENHYDRAMINE HCL 25 MG PO CAPS
ORAL_CAPSULE | ORAL | Status: AC
  Filled 2016-11-17: qty 1

## 2016-11-17 MED ORDER — TRASTUZUMAB CHEMO 150 MG IV SOLR
6.0000 mg/kg | Freq: Once | INTRAVENOUS | Status: AC
  Administered 2016-11-17: 903 mg via INTRAVENOUS
  Filled 2016-11-17: qty 43

## 2016-11-17 MED ORDER — HEPARIN SOD (PORK) LOCK FLUSH 100 UNIT/ML IV SOLN
500.0000 [IU] | Freq: Once | INTRAVENOUS | Status: AC | PRN
  Administered 2016-11-17: 500 [IU]
  Filled 2016-11-17: qty 5

## 2016-11-17 MED ORDER — SODIUM CHLORIDE 0.9 % IJ SOLN
10.0000 mL | INTRAMUSCULAR | Status: DC | PRN
  Administered 2016-11-17: 10 mL
  Filled 2016-11-17: qty 10

## 2016-11-17 MED ORDER — DICYCLOMINE HCL 10 MG PO CAPS: 10.0000 mg | ORAL_CAPSULE | Freq: Two times a day (BID) | ORAL | 1 refills | Status: DC | PRN

## 2016-11-17 MED ORDER — DIPHENHYDRAMINE HCL 25 MG PO CAPS
25.0000 mg | ORAL_CAPSULE | Freq: Once | ORAL | Status: AC
  Administered 2016-11-17: 25 mg via ORAL

## 2016-11-17 MED ORDER — SODIUM CHLORIDE 0.9 % IV SOLN
Freq: Once | INTRAVENOUS | Status: AC
  Administered 2016-11-17: 12:00:00 via INTRAVENOUS

## 2016-11-17 MED ORDER — LORAZEPAM 2 MG/ML IJ SOLN: 0.5000 mg | Freq: Once | INTRAMUSCULAR | Status: DC

## 2016-11-17 MED ORDER — ACETAMINOPHEN 325 MG PO TABS
ORAL_TABLET | ORAL | Status: AC
  Filled 2016-11-17: qty 2

## 2016-11-17 MED FILL — OxyCONTIN 10 MG T12A: 10 | 30 days supply | Qty: 60 | Fill #0

## 2016-11-17 MED FILL — DICYCLOMINE 10 MG CAPSULE: 10 | 15 days supply | Qty: 30 | Fill #0

## 2016-11-17 NOTE — Progress Notes (Signed)
Pt reports continued problems with diarrhea and constipation.  States she is taking the stool softener but then will have diarrhea.  LBM x 2 days ago.  States she feels "very bloated and gassy." reports she is taking the pepcid that was prescribed, but feels that it is not helping.  Val, RN called and message left.  Pt states she also needs a refill on her pain medication.

## 2016-11-17 NOTE — Patient Instructions (Signed)
[facility] Discharge Instructions for Patients  Today you received the following: Herceptin   To help prevent nausea and vomiting after your treatment, we encourage you to take your nausea medication as directed.   If you develop nausea and vomiting that is not controlled by your nausea medication, call the clinic.   BELOW ARE SYMPTOMS THAT SHOULD BE REPORTED IMMEDIATELY:  *FEVER GREATER THAN 100.5 F  *CHILLS WITH OR WITHOUT FEVER  NAUSEA AND VOMITING THAT IS NOT CONTROLLED WITH YOUR NAUSEA MEDICATION  *UNUSUAL SHORTNESS OF BREATH  *UNUSUAL BRUISING OR BLEEDING  TENDERNESS IN MOUTH AND THROAT WITH OR WITHOUT PRESENCE OF ULCERS  *URINARY PROBLEMS  *BOWEL PROBLEMS  UNUSUAL RASH Items with * indicate a potential emergency and should be followed up as soon as possible.  Feel free to call the clinic you have any questions or concerns. The clinic phone number is [phone].  Please show the CHEMO ALERT CARD at check-in to the Emergency Department and triage nurse.   

## 2016-11-18 LAB — CANCER ANTIGEN 27.29: CA 27.29: 18.8 U/mL (ref 0.0–38.6)

## 2016-11-29 DIAGNOSIS — I5020 Unspecified systolic (congestive) heart failure: Secondary | ICD-10-CM | POA: Diagnosis not present

## 2016-12-02 ENCOUNTER — Other Ambulatory Visit: Payer: Self-pay | Admitting: Family Medicine

## 2016-12-03 ENCOUNTER — Telehealth: Payer: Self-pay | Admitting: *Deleted

## 2016-12-03 NOTE — Telephone Encounter (Signed)
Received fax from [facility] requesting to change Novolog to Humalog.  Insurance will not cover Novolog.  Derl Barrow, RN

## 2016-12-04 NOTE — Telephone Encounter (Signed)
[place] Pharmacist with [facility] called to change Novolog to Humalog.  Patient's insurance will only cover Humalog.  Order given by Dr. Erin Hearing to change Novolog to Humalog.  Derl Barrow, RN

## 2016-12-05 ENCOUNTER — Other Ambulatory Visit: Payer: Self-pay | Admitting: Family Medicine

## 2016-12-08 ENCOUNTER — Ambulatory Visit: Payer: Medicare Other

## 2016-12-08 NOTE — Telephone Encounter (Signed)
They received a script for novolog and need this rewritten for humalog.  Patients insurance prefer she have this one due to coverage. [name],CMA

## 2016-12-08 NOTE — Addendum Note (Signed)
Addended by: Valerie Roys on: 12/08/2016 03:19 PM   Modules accepted: Orders

## 2016-12-09 MED ORDER — INSULIN LISPRO 100 UNIT/ML ~~LOC~~ SOLN: 10.0000 [IU] | Freq: Three times a day (TID) | SUBCUTANEOUS | 11 refills | Status: DC

## 2016-12-09 NOTE — Addendum Note (Signed)
Addended by: Talbert Cage L on: 12/09/2016 09:00 AM   Modules accepted: Orders

## 2016-12-09 NOTE — Telephone Encounter (Signed)
Please call pharmacy and give verbal order for which ever insulin her insurance will cover  Page me if questions [phone]  Thanks  [place]

## 2016-12-15 ENCOUNTER — Ambulatory Visit (HOSPITAL_BASED_OUTPATIENT_CLINIC_OR_DEPARTMENT_OTHER): Payer: Medicare Other

## 2016-12-15 ENCOUNTER — Other Ambulatory Visit (HOSPITAL_BASED_OUTPATIENT_CLINIC_OR_DEPARTMENT_OTHER): Payer: Medicare Other

## 2016-12-15 ENCOUNTER — Other Ambulatory Visit: Payer: Self-pay | Admitting: *Deleted

## 2016-12-15 VITALS — BP 144/82 | HR 76 | Temp 98.3°F | Resp 20

## 2016-12-15 DIAGNOSIS — Z170 Estrogen receptor positive status [ER+]: Secondary | ICD-10-CM

## 2016-12-15 DIAGNOSIS — C7802 Secondary malignant neoplasm of left lung: Principal | ICD-10-CM

## 2016-12-15 DIAGNOSIS — C50212 Malignant neoplasm of upper-inner quadrant of left female breast: Secondary | ICD-10-CM

## 2016-12-15 DIAGNOSIS — C50912 Malignant neoplasm of unspecified site of left female breast: Secondary | ICD-10-CM | POA: Diagnosis not present

## 2016-12-15 DIAGNOSIS — M48061 Spinal stenosis, lumbar region without neurogenic claudication: Secondary | ICD-10-CM

## 2016-12-15 DIAGNOSIS — M47813 Spondylosis without myelopathy or radiculopathy, cervicothoracic region: Secondary | ICD-10-CM

## 2016-12-15 DIAGNOSIS — Z5112 Encounter for antineoplastic immunotherapy: Secondary | ICD-10-CM

## 2016-12-15 LAB — CBC WITH DIFFERENTIAL/PLATELET
BASO%: 0.4 % (ref 0.0–2.0)
Basophils Absolute: 0 10*3/uL (ref 0.0–0.1)
EOS%: 1.2 % (ref 0.0–7.0)
Eosinophils Absolute: 0.1 10*3/uL (ref 0.0–0.5)
HCT: 40.8 % (ref 34.8–46.6)
HGB: 13.4 g/dL (ref 11.6–15.9)
LYMPH%: 23.6 % (ref 14.0–49.7)
MCH: 27.4 pg (ref 25.1–34.0)
MCHC: 32.9 g/dL (ref 31.5–36.0)
MCV: 83.3 fL (ref 79.5–101.0)
MONO#: 0.4 10*3/uL (ref 0.1–0.9)
MONO%: 5.9 % (ref 0.0–14.0)
NEUT#: 4.5 10*3/uL (ref 1.5–6.5)
NEUT%: 68.9 % (ref 38.4–76.8)
Platelets: 251 10*3/uL (ref 145–400)
RBC: 4.9 10*6/uL (ref 3.70–5.45)
RDW: 15.3 % — ABNORMAL HIGH (ref 11.2–14.5)
WBC: 6.5 10*3/uL (ref 3.9–10.3)
lymph#: 1.5 10*3/uL (ref 0.9–3.3)

## 2016-12-15 LAB — COMPREHENSIVE METABOLIC PANEL
ALT: 38 U/L (ref 0–55)
AST: 22 U/L (ref 5–34)
Albumin: 3.3 g/dL — ABNORMAL LOW (ref 3.5–5.0)
Alkaline Phosphatase: 133 U/L (ref 40–150)
Anion Gap: 10 mEq/L (ref 3–11)
BUN: 15.5 mg/dL (ref 7.0–26.0)
CO2: 33 mEq/L — ABNORMAL HIGH (ref 22–29)
Calcium: 9.9 mg/dL (ref 8.4–10.4)
Chloride: 100 mEq/L (ref 98–109)
Creatinine: 0.8 mg/dL (ref 0.6–1.1)
EGFR: 80 mL/min/{1.73_m2} — ABNORMAL LOW (ref >90)
Glucose: 204 mg/dl — ABNORMAL HIGH (ref 70–140)
Potassium: 4.2 mEq/L (ref 3.5–5.1)
Sodium: 142 mEq/L (ref 136–145)
Total Bilirubin: 0.47 mg/dL (ref 0.20–1.20)
Total Protein: 7.6 g/dL (ref 6.4–8.3)

## 2016-12-15 MED ORDER — SODIUM CHLORIDE 0.9 % IJ SOLN
10.0000 mL | INTRAMUSCULAR | Status: DC | PRN
  Administered 2016-12-15: 10 mL
  Filled 2016-12-15: qty 10

## 2016-12-15 MED ORDER — ACETAMINOPHEN 325 MG PO TABS
ORAL_TABLET | ORAL | Status: AC
  Filled 2016-12-15: qty 2

## 2016-12-15 MED ORDER — HEPARIN SOD (PORK) LOCK FLUSH 100 UNIT/ML IV SOLN
500.0000 [IU] | Freq: Once | INTRAVENOUS | Status: AC | PRN
  Administered 2016-12-15: 500 [IU]
  Filled 2016-12-15: qty 5

## 2016-12-15 MED ORDER — SODIUM CHLORIDE 0.9 % IV SOLN
Freq: Once | INTRAVENOUS | Status: AC
  Administered 2016-12-15: 11:00:00 via INTRAVENOUS

## 2016-12-15 MED ORDER — OXYCODONE HCL ER 10 MG PO T12A: 10.0000 mg | EXTENDED_RELEASE_TABLET | Freq: Two times a day (BID) | ORAL | 0 refills | Status: DC

## 2016-12-15 MED ORDER — DIPHENHYDRAMINE HCL 25 MG PO CAPS
ORAL_CAPSULE | ORAL | Status: AC
  Filled 2016-12-15: qty 1

## 2016-12-15 MED ORDER — TRASTUZUMAB CHEMO 150 MG IV SOLR
6.0000 mg/kg | Freq: Once | INTRAVENOUS | Status: AC
  Administered 2016-12-15: 903 mg via INTRAVENOUS
  Filled 2016-12-15: qty 43

## 2016-12-15 MED ORDER — LORAZEPAM 2 MG/ML IJ SOLN: 0.5000 mg | Freq: Once | INTRAMUSCULAR | Status: DC

## 2016-12-15 MED ORDER — DIPHENHYDRAMINE HCL 25 MG PO CAPS
25.0000 mg | ORAL_CAPSULE | Freq: Once | ORAL | Status: AC
  Administered 2016-12-15: 25 mg via ORAL

## 2016-12-15 MED ORDER — ACETAMINOPHEN 325 MG PO TABS
650.0000 mg | ORAL_TABLET | Freq: Once | ORAL | Status: AC
  Administered 2016-12-15: 650 mg via ORAL

## 2016-12-15 MED FILL — OxyCONTIN 10 MG T12A: 10 | 30 days supply | Qty: 60 | Fill #0

## 2016-12-15 NOTE — Patient Instructions (Signed)
[facility] Discharge Instructions for Patients Receiving Chemotherapy  Today you received the following chemotherapy agents: Herceptin   To help prevent nausea and vomiting after your treatment, we encourage you to take your nausea medication as directed.    If you develop nausea and vomiting that is not controlled by your nausea medication, call the clinic.   BELOW ARE SYMPTOMS THAT SHOULD BE REPORTED IMMEDIATELY:  *FEVER GREATER THAN 100.5 F  *CHILLS WITH OR WITHOUT FEVER  NAUSEA AND VOMITING THAT IS NOT CONTROLLED WITH YOUR NAUSEA MEDICATION  *UNUSUAL SHORTNESS OF BREATH  *UNUSUAL BRUISING OR BLEEDING  TENDERNESS IN MOUTH AND THROAT WITH OR WITHOUT PRESENCE OF ULCERS  *URINARY PROBLEMS  *BOWEL PROBLEMS  UNUSUAL RASH Items with * indicate a potential emergency and should be followed up as soon as possible.  Feel free to call the clinic you have any questions or concerns. The clinic phone number is [phone].  Please show the CHEMO ALERT CARD at check-in to the Emergency Department and triage nurse.   

## 2016-12-15 NOTE — Progress Notes (Signed)
Pt stated appt for echo is 12/25/2016

## 2016-12-16 LAB — CANCER ANTIGEN 27.29: CA 27.29: 20.7 U/mL (ref 0.0–38.6)

## 2016-12-19 ENCOUNTER — Other Ambulatory Visit: Payer: Self-pay | Admitting: *Deleted

## 2016-12-19 MED ORDER — GLUCOSE BLOOD VI STRP: ORAL_STRIP | 0 refills | Status: DC

## 2016-12-19 NOTE — Progress Notes (Unsigned)
Pharmacy called needing ICD-10 code for medicare billing.  Derl Barrow, RN

## 2016-12-22 ENCOUNTER — Other Ambulatory Visit: Payer: Self-pay | Admitting: *Deleted

## 2016-12-22 MED ORDER — GABAPENTIN 100 MG PO CAPS: 400.0000 mg | ORAL_CAPSULE | Freq: Three times a day (TID) | ORAL | 0 refills | Status: DC

## 2016-12-24 ENCOUNTER — Other Ambulatory Visit (HOSPITAL_COMMUNITY): Payer: Medicare Other

## 2016-12-25 ENCOUNTER — Telehealth: Payer: Self-pay

## 2016-12-25 ENCOUNTER — Other Ambulatory Visit (HOSPITAL_COMMUNITY): Payer: Medicare Other

## 2016-12-25 NOTE — Telephone Encounter (Signed)
Pt went to [place] for her echo. It was scheduled at [facility] on [address]. She went there. They could not find parking and pt had not brought her wheelchair. There were no wheelchairs available. She went home. She is asking to have her echo rescheduled at [place].  s/w cone scheduler and she will have the [address] office call pt to r/s.

## 2016-12-29 DIAGNOSIS — I5020 Unspecified systolic (congestive) heart failure: Secondary | ICD-10-CM | POA: Diagnosis not present

## 2017-01-05 ENCOUNTER — Ambulatory Visit: Payer: Medicare Other

## 2017-01-06 ENCOUNTER — Ambulatory Visit (HOSPITAL_COMMUNITY)
Admission: RE | Admit: 2017-01-06 | Discharge: 2017-01-06 | Disposition: A | Discharge status: Home / Self Care | Payer: Medicare Other | Source: Ambulatory Visit | Attending: Oncology | Admitting: Oncology

## 2017-01-06 DIAGNOSIS — I10 Essential (primary) hypertension: Secondary | ICD-10-CM | POA: Diagnosis not present

## 2017-01-06 DIAGNOSIS — E1342 Other specified diabetes mellitus with diabetic polyneuropathy: Secondary | ICD-10-CM | POA: Diagnosis not present

## 2017-01-06 DIAGNOSIS — Z170 Estrogen receptor positive status [ER+]: Secondary | ICD-10-CM | POA: Diagnosis not present

## 2017-01-06 DIAGNOSIS — C50912 Malignant neoplasm of unspecified site of left female breast: Secondary | ICD-10-CM

## 2017-01-06 DIAGNOSIS — C7802 Secondary malignant neoplasm of left lung: Secondary | ICD-10-CM | POA: Diagnosis not present

## 2017-01-06 DIAGNOSIS — J449 Chronic obstructive pulmonary disease, unspecified: Secondary | ICD-10-CM | POA: Insufficient documentation

## 2017-01-06 DIAGNOSIS — C50212 Malignant neoplasm of upper-inner quadrant of left female breast: Secondary | ICD-10-CM | POA: Diagnosis not present

## 2017-01-06 DIAGNOSIS — E1149 Type 2 diabetes mellitus with other diabetic neurological complication: Secondary | ICD-10-CM | POA: Diagnosis not present

## 2017-01-06 NOTE — Progress Notes (Signed)
  Echocardiogram 2D Echocardiogram has been performed.  [name] 01/06/2017, 9:22 AM

## 2017-01-13 ENCOUNTER — Ambulatory Visit (HOSPITAL_BASED_OUTPATIENT_CLINIC_OR_DEPARTMENT_OTHER): Payer: Medicare Other

## 2017-01-13 ENCOUNTER — Other Ambulatory Visit (HOSPITAL_BASED_OUTPATIENT_CLINIC_OR_DEPARTMENT_OTHER): Payer: Medicare Other

## 2017-01-13 ENCOUNTER — Other Ambulatory Visit: Payer: Self-pay | Admitting: *Deleted

## 2017-01-13 ENCOUNTER — Other Ambulatory Visit: Payer: Self-pay | Admitting: Family Medicine

## 2017-01-13 VITALS — BP 143/72 | HR 63 | Temp 98.3°F | Resp 20

## 2017-01-13 DIAGNOSIS — M47813 Spondylosis without myelopathy or radiculopathy, cervicothoracic region: Secondary | ICD-10-CM

## 2017-01-13 DIAGNOSIS — C7802 Secondary malignant neoplasm of left lung: Secondary | ICD-10-CM | POA: Diagnosis not present

## 2017-01-13 DIAGNOSIS — C50912 Malignant neoplasm of unspecified site of left female breast: Secondary | ICD-10-CM

## 2017-01-13 DIAGNOSIS — C50212 Malignant neoplasm of upper-inner quadrant of left female breast: Secondary | ICD-10-CM

## 2017-01-13 DIAGNOSIS — Z5112 Encounter for antineoplastic immunotherapy: Secondary | ICD-10-CM | POA: Diagnosis not present

## 2017-01-13 DIAGNOSIS — Z170 Estrogen receptor positive status [ER+]: Secondary | ICD-10-CM

## 2017-01-13 DIAGNOSIS — M48061 Spinal stenosis, lumbar region without neurogenic claudication: Secondary | ICD-10-CM

## 2017-01-13 LAB — COMPREHENSIVE METABOLIC PANEL
ALT: 27 U/L (ref 0–55)
AST: 17 U/L (ref 5–34)
Albumin: 3.3 g/dL — ABNORMAL LOW (ref 3.5–5.0)
Alkaline Phosphatase: 116 U/L (ref 40–150)
Anion Gap: 8 mEq/L (ref 3–11)
BUN: 14.5 mg/dL (ref 7.0–26.0)
CO2: 33 mEq/L — ABNORMAL HIGH (ref 22–29)
Calcium: 10 mg/dL (ref 8.4–10.4)
Chloride: 101 mEq/L (ref 98–109)
Creatinine: 0.8 mg/dL (ref 0.6–1.1)
EGFR: 77 mL/min/{1.73_m2} — ABNORMAL LOW (ref >90)
Glucose: 138 mg/dl (ref 70–140)
Potassium: 4.5 mEq/L (ref 3.5–5.1)
Sodium: 143 mEq/L (ref 136–145)
Total Bilirubin: 0.39 mg/dL (ref 0.20–1.20)
Total Protein: 7.5 g/dL (ref 6.4–8.3)

## 2017-01-13 LAB — CBC WITH DIFFERENTIAL/PLATELET
BASO%: 0.3 % (ref 0.0–2.0)
Basophils Absolute: 0 10*3/uL (ref 0.0–0.1)
EOS%: 1.2 % (ref 0.0–7.0)
Eosinophils Absolute: 0.1 10*3/uL (ref 0.0–0.5)
HCT: 42.8 % (ref 34.8–46.6)
HGB: 13.6 g/dL (ref 11.6–15.9)
LYMPH%: 28.3 % (ref 14.0–49.7)
MCH: 27.6 pg (ref 25.1–34.0)
MCHC: 31.8 g/dL (ref 31.5–36.0)
MCV: 87 fL (ref 79.5–101.0)
MONO#: 0.4 10*3/uL (ref 0.1–0.9)
MONO%: 5.9 % (ref 0.0–14.0)
NEUT#: 4.7 10*3/uL (ref 1.5–6.5)
NEUT%: 64.3 % (ref 38.4–76.8)
Platelets: 251 10*3/uL (ref 145–400)
RBC: 4.92 10*6/uL (ref 3.70–5.45)
RDW: 14.8 % — ABNORMAL HIGH (ref 11.2–14.5)
WBC: 7.3 10*3/uL (ref 3.9–10.3)
lymph#: 2.1 10*3/uL (ref 0.9–3.3)

## 2017-01-13 MED ORDER — ACETAMINOPHEN 325 MG PO TABS
ORAL_TABLET | ORAL | Status: AC
  Filled 2017-01-13: qty 2

## 2017-01-13 MED ORDER — SODIUM CHLORIDE 0.9 % IV SOLN
Freq: Once | INTRAVENOUS | Status: AC
  Administered 2017-01-13: 10:00:00 via INTRAVENOUS

## 2017-01-13 MED ORDER — SODIUM CHLORIDE 0.9 % IJ SOLN
10.0000 mL | INTRAMUSCULAR | Status: DC | PRN
Start: 2017-01-13 — End: 2017-01-13
  Administered 2017-01-13: 10 mL
  Filled 2017-01-13: qty 10

## 2017-01-13 MED ORDER — ACETAMINOPHEN 325 MG PO TABS
650.0000 mg | ORAL_TABLET | Freq: Once | ORAL | Status: AC
  Administered 2017-01-13: 650 mg via ORAL

## 2017-01-13 MED ORDER — SODIUM CHLORIDE 0.9 % IV SOLN
900.0000 mg | Freq: Once | INTRAVENOUS | Status: AC
  Administered 2017-01-13: 900 mg via INTRAVENOUS
  Filled 2017-01-13: qty 42.86

## 2017-01-13 MED ORDER — OXYCODONE HCL ER 10 MG PO T12A: 10.0000 mg | EXTENDED_RELEASE_TABLET | Freq: Two times a day (BID) | ORAL | 0 refills | Status: DC

## 2017-01-13 MED ORDER — DIPHENHYDRAMINE HCL 25 MG PO CAPS
25.0000 mg | ORAL_CAPSULE | Freq: Once | ORAL | Status: AC
  Administered 2017-01-13: 25 mg via ORAL

## 2017-01-13 MED ORDER — TRASTUZUMAB CHEMO 150 MG IV SOLR: 6.0000 mg/kg | Freq: Once | INTRAVENOUS | Status: DC

## 2017-01-13 MED ORDER — LORAZEPAM 2 MG/ML IJ SOLN
INTRAMUSCULAR | Status: AC
  Filled 2017-01-13: qty 1

## 2017-01-13 MED ORDER — DIPHENHYDRAMINE HCL 25 MG PO CAPS
ORAL_CAPSULE | ORAL | Status: AC
  Filled 2017-01-13: qty 1

## 2017-01-13 MED ORDER — HEPARIN SOD (PORK) LOCK FLUSH 100 UNIT/ML IV SOLN
500.0000 [IU] | Freq: Once | INTRAVENOUS | Status: AC | PRN
  Administered 2017-01-13: 500 [IU]
  Filled 2017-01-13: qty 5

## 2017-01-13 MED ORDER — LORAZEPAM 2 MG/ML IJ SOLN: 0.5000 mg | Freq: Once | INTRAMUSCULAR | Status: DC

## 2017-01-13 NOTE — Patient Instructions (Signed)
[facility] Discharge Instructions for Patients Receiving Chemotherapy  Today you received the following chemotherapy agents: Herceptin   To help prevent nausea and vomiting after your treatment, we encourage you to take your nausea medication as directed.    If you develop nausea and vomiting that is not controlled by your nausea medication, call the clinic.   BELOW ARE SYMPTOMS THAT SHOULD BE REPORTED IMMEDIATELY:  *FEVER GREATER THAN 100.5 F  *CHILLS WITH OR WITHOUT FEVER  NAUSEA AND VOMITING THAT IS NOT CONTROLLED WITH YOUR NAUSEA MEDICATION  *UNUSUAL SHORTNESS OF BREATH  *UNUSUAL BRUISING OR BLEEDING  TENDERNESS IN MOUTH AND THROAT WITH OR WITHOUT PRESENCE OF ULCERS  *URINARY PROBLEMS  *BOWEL PROBLEMS  UNUSUAL RASH Items with * indicate a potential emergency and should be followed up as soon as possible.  Feel free to call the clinic you have any questions or concerns. The clinic phone number is [phone].  Please show the CHEMO ALERT CARD at check-in to the Emergency Department and triage nurse.   

## 2017-01-14 LAB — CANCER ANTIGEN 27.29: CA 27.29: 16.7 U/mL (ref 0.0–38.6)

## 2017-01-16 MED FILL — OxyCONTIN 10 MG T12A: 10 | 30 days supply | Qty: 60 | Fill #0

## 2017-01-29 DIAGNOSIS — I5020 Unspecified systolic (congestive) heart failure: Secondary | ICD-10-CM | POA: Diagnosis not present

## 2017-02-09 ENCOUNTER — Other Ambulatory Visit (HOSPITAL_BASED_OUTPATIENT_CLINIC_OR_DEPARTMENT_OTHER): Payer: Medicare Other

## 2017-02-09 ENCOUNTER — Ambulatory Visit (HOSPITAL_BASED_OUTPATIENT_CLINIC_OR_DEPARTMENT_OTHER): Payer: Medicare Other | Admitting: Oncology

## 2017-02-09 ENCOUNTER — Ambulatory Visit (HOSPITAL_BASED_OUTPATIENT_CLINIC_OR_DEPARTMENT_OTHER): Payer: Medicare Other

## 2017-02-09 VITALS — BP 142/54 | HR 74 | Temp 98.3°F | Resp 18 | Ht 65.0 in | Wt 318.1 lb

## 2017-02-09 DIAGNOSIS — Z170 Estrogen receptor positive status [ER+]: Secondary | ICD-10-CM | POA: Diagnosis not present

## 2017-02-09 DIAGNOSIS — C7800 Secondary malignant neoplasm of unspecified lung: Secondary | ICD-10-CM

## 2017-02-09 DIAGNOSIS — K2970 Gastritis, unspecified, without bleeding: Secondary | ICD-10-CM

## 2017-02-09 DIAGNOSIS — C50212 Malignant neoplasm of upper-inner quadrant of left female breast: Secondary | ICD-10-CM | POA: Diagnosis not present

## 2017-02-09 DIAGNOSIS — Z5112 Encounter for antineoplastic immunotherapy: Secondary | ICD-10-CM

## 2017-02-09 DIAGNOSIS — C7802 Secondary malignant neoplasm of left lung: Secondary | ICD-10-CM

## 2017-02-09 DIAGNOSIS — C50919 Malignant neoplasm of unspecified site of unspecified female breast: Secondary | ICD-10-CM

## 2017-02-09 DIAGNOSIS — C50912 Malignant neoplasm of unspecified site of left female breast: Secondary | ICD-10-CM

## 2017-02-09 DIAGNOSIS — Z86718 Personal history of other venous thrombosis and embolism: Secondary | ICD-10-CM

## 2017-02-09 DIAGNOSIS — M47813 Spondylosis without myelopathy or radiculopathy, cervicothoracic region: Secondary | ICD-10-CM | POA: Diagnosis not present

## 2017-02-09 DIAGNOSIS — M48061 Spinal stenosis, lumbar region without neurogenic claudication: Secondary | ICD-10-CM

## 2017-02-09 LAB — COMPREHENSIVE METABOLIC PANEL
ALT: 34 U/L (ref 0–55)
AST: 26 U/L (ref 5–34)
Albumin: 3.2 g/dL — ABNORMAL LOW (ref 3.5–5.0)
Alkaline Phosphatase: 115 U/L (ref 40–150)
Anion Gap: 10 mEq/L (ref 3–11)
BUN: 16.3 mg/dL (ref 7.0–26.0)
CO2: 33 mEq/L — ABNORMAL HIGH (ref 22–29)
Calcium: 9.8 mg/dL (ref 8.4–10.4)
Chloride: 99 mEq/L (ref 98–109)
Creatinine: 0.8 mg/dL (ref 0.6–1.1)
EGFR: 73 mL/min/{1.73_m2} — ABNORMAL LOW (ref >90)
Glucose: 196 mg/dl — ABNORMAL HIGH (ref 70–140)
Potassium: 4.2 mEq/L (ref 3.5–5.1)
Sodium: 142 mEq/L (ref 136–145)
Total Bilirubin: 0.36 mg/dL (ref 0.20–1.20)
Total Protein: 7.2 g/dL (ref 6.4–8.3)

## 2017-02-09 LAB — CBC WITH DIFFERENTIAL/PLATELET
BASO%: 0.5 % (ref 0.0–2.0)
Basophils Absolute: 0 10*3/uL (ref 0.0–0.1)
EOS%: 1.5 % (ref 0.0–7.0)
Eosinophils Absolute: 0.1 10*3/uL (ref 0.0–0.5)
HCT: 41.8 % (ref 34.8–46.6)
HGB: 13.5 g/dL (ref 11.6–15.9)
LYMPH%: 29.7 % (ref 14.0–49.7)
MCH: 27.4 pg (ref 25.1–34.0)
MCHC: 32.3 g/dL (ref 31.5–36.0)
MCV: 84.8 fL (ref 79.5–101.0)
MONO#: 0.4 10*3/uL (ref 0.1–0.9)
MONO%: 6.6 % (ref 0.0–14.0)
NEUT#: 3.8 10*3/uL (ref 1.5–6.5)
NEUT%: 61.7 % (ref 38.4–76.8)
Platelets: 235 10*3/uL (ref 145–400)
RBC: 4.92 10*6/uL (ref 3.70–5.45)
RDW: 15.4 % — ABNORMAL HIGH (ref 11.2–14.5)
WBC: 6.1 10*3/uL (ref 3.9–10.3)
lymph#: 1.8 10*3/uL (ref 0.9–3.3)

## 2017-02-09 MED ORDER — LORAZEPAM 2 MG/ML IJ SOLN: 0.5000 mg | Freq: Once | INTRAMUSCULAR | Status: DC

## 2017-02-09 MED ORDER — SODIUM CHLORIDE 0.9 % IV SOLN
Freq: Once | INTRAVENOUS | Status: AC
  Administered 2017-02-09: 13:00:00 via INTRAVENOUS

## 2017-02-09 MED ORDER — LORAZEPAM 2 MG/ML IJ SOLN
INTRAMUSCULAR | Status: AC
  Filled 2017-02-09: qty 1

## 2017-02-09 MED ORDER — SODIUM CHLORIDE 0.9 % IJ SOLN
10.0000 mL | INTRAMUSCULAR | Status: DC | PRN
  Administered 2017-02-09: 10 mL
  Filled 2017-02-09: qty 10

## 2017-02-09 MED ORDER — DIPHENHYDRAMINE HCL 25 MG PO CAPS
ORAL_CAPSULE | ORAL | Status: AC
  Filled 2017-02-09: qty 2

## 2017-02-09 MED ORDER — HEPARIN SOD (PORK) LOCK FLUSH 100 UNIT/ML IV SOLN
500.0000 [IU] | Freq: Once | INTRAVENOUS | Status: AC | PRN
  Administered 2017-02-09: 500 [IU]
  Filled 2017-02-09: qty 5

## 2017-02-09 MED ORDER — DIPHENHYDRAMINE HCL 25 MG PO CAPS
25.0000 mg | ORAL_CAPSULE | Freq: Once | ORAL | Status: AC
  Administered 2017-02-09: 25 mg via ORAL

## 2017-02-09 MED ORDER — ACETAMINOPHEN 325 MG PO TABS
650.0000 mg | ORAL_TABLET | Freq: Once | ORAL | Status: AC
  Administered 2017-02-09: 650 mg via ORAL

## 2017-02-09 MED ORDER — ACETAMINOPHEN 325 MG PO TABS
ORAL_TABLET | ORAL | Status: AC
  Filled 2017-02-09: qty 2

## 2017-02-09 MED ORDER — SODIUM CHLORIDE 0.9 % IV SOLN
900.0000 mg | Freq: Once | INTRAVENOUS | Status: AC
  Administered 2017-02-09: 900 mg via INTRAVENOUS
  Filled 2017-02-09: qty 42.86

## 2017-02-09 MED ORDER — OXYCODONE HCL ER 10 MG PO T12A: 10.0000 mg | EXTENDED_RELEASE_TABLET | Freq: Two times a day (BID) | ORAL | 0 refills | Status: DC

## 2017-02-09 NOTE — Patient Instructions (Signed)
[facility] Discharge Instructions for Patients Receiving Chemotherapy  Today you received the following chemotherapy agents: Herceptin   To help prevent nausea and vomiting after your treatment, we encourage you to take your nausea medication as directed.    If you develop nausea and vomiting that is not controlled by your nausea medication, call the clinic.   BELOW ARE SYMPTOMS THAT SHOULD BE REPORTED IMMEDIATELY:  *FEVER GREATER THAN 100.5 F  *CHILLS WITH OR WITHOUT FEVER  NAUSEA AND VOMITING THAT IS NOT CONTROLLED WITH YOUR NAUSEA MEDICATION  *UNUSUAL SHORTNESS OF BREATH  *UNUSUAL BRUISING OR BLEEDING  TENDERNESS IN MOUTH AND THROAT WITH OR WITHOUT PRESENCE OF ULCERS  *URINARY PROBLEMS  *BOWEL PROBLEMS  UNUSUAL RASH Items with * indicate a potential emergency and should be followed up as soon as possible.  Feel free to call the clinic you have any questions or concerns. The clinic phone number is [phone].  Please show the CHEMO ALERT CARD at check-in to the Emergency Department and triage nurse.   
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
## 2017-02-10 LAB — CANCER ANTIGEN 27.29: CA 27.29: 18.7 U/mL (ref 0.0–38.6)

## 2017-02-13 MED FILL — OxyCONTIN 10 MG T12A: 10 | 30 days supply | Qty: 60 | Fill #0

## 2017-02-17 ENCOUNTER — Telehealth: Payer: Self-pay | Admitting: *Deleted

## 2017-02-17 MED ORDER — CIPROFLOXACIN HCL 500 MG PO TABS
500.0000 mg | ORAL_TABLET | Freq: Two times a day (BID) | ORAL | 0 refills | Status: DC
Start: 2017-02-17 — End: 2017-04-06

## 2017-02-17 NOTE — Telephone Encounter (Signed)
Pt left message stating symptoms of a UTI and requesting an antibiotic.  Antibiotic obtained and sent to her pharmacy- call returned and message left on identified VM.

## 2017-02-27 ENCOUNTER — Other Ambulatory Visit: Payer: Self-pay | Admitting: Family Medicine

## 2017-02-28 DIAGNOSIS — I5020 Unspecified systolic (congestive) heart failure: Secondary | ICD-10-CM | POA: Diagnosis not present

## 2017-03-02 ENCOUNTER — Telehealth: Payer: Self-pay | Admitting: Family Medicine

## 2017-03-02 NOTE — Telephone Encounter (Signed)
After Hours Emergency Line:  Pt states she took her 28 units of Lantus this morning around 9 AM this morning along with 10 units of Humalog.  She did not check her blood sugar prior to this.  Around 1 PM her blood sugar was 560 and so she took 10 units of Humalog.  Did not eat anything as she was afraid it would further raise her sugars but she did eat a boiled egg at this time as this is what her sister does when her sugars run high.    At 5:30 PM she took 10 units of Novolog and ate some chicken tenders as she was very hungry.  Waited about 2 hours before rechecking at 7:30 PM and it had not come down.  Pt notes she feels fine and is not experiencing any additional symptoms. Denies abdominal pain, nausea, vomiting, fatigue or signs of dehydration.  She has been drinking plenty of water and staying hydrated.  Advised her to take an additional 20 U Lantus at bedtime and to check her sugars overnight.  Return precautions discussed.  Pt advised to come in to be evaluated if she were to experience nausea, vomiting, abdominal pain.   She notes she does not like coming into hospitals due to her cancer and risk of catching an infection.  Attempted to make an appointment tomorrow in clinic however was not able to schedule a spot until Wed 7/18.   Will have patient call in the morning to schedule a same-day visit with a provider if possible.    Lovenia Kim, MD  PGY-2 03/02/2017

## 2017-03-03 ENCOUNTER — Other Ambulatory Visit: Payer: Self-pay | Admitting: Family Medicine

## 2017-03-03 NOTE — Telephone Encounter (Signed)
Pt called because her glucometer is broken and she was getting a lot of readings over 500, she called the hospital last night because she was very worried. She has had her current meter for over 4 years, she did change the batteries and it is still sending wrong readings. She checks her blood sugar 4 times a day so it is important that she get a new one right away. jw

## 2017-03-04 MED ORDER — ONETOUCH ULTRA 2 W/DEVICE KIT: 1.0000 | PACK | Freq: Four times a day (QID) | 0 refills | Status: DC

## 2017-03-09 ENCOUNTER — Ambulatory Visit (HOSPITAL_BASED_OUTPATIENT_CLINIC_OR_DEPARTMENT_OTHER): Payer: Medicare Other

## 2017-03-09 ENCOUNTER — Other Ambulatory Visit (HOSPITAL_BASED_OUTPATIENT_CLINIC_OR_DEPARTMENT_OTHER): Payer: Medicare Other

## 2017-03-09 DIAGNOSIS — Z170 Estrogen receptor positive status [ER+]: Secondary | ICD-10-CM

## 2017-03-09 DIAGNOSIS — C7802 Secondary malignant neoplasm of left lung: Secondary | ICD-10-CM

## 2017-03-09 DIAGNOSIS — C50212 Malignant neoplasm of upper-inner quadrant of left female breast: Secondary | ICD-10-CM | POA: Diagnosis not present

## 2017-03-09 DIAGNOSIS — Z5112 Encounter for antineoplastic immunotherapy: Secondary | ICD-10-CM

## 2017-03-09 LAB — CBC WITH DIFFERENTIAL/PLATELET
BASO%: 0.2 % (ref 0.0–2.0)
Basophils Absolute: 0 10*3/uL (ref 0.0–0.1)
EOS%: 1.7 % (ref 0.0–7.0)
Eosinophils Absolute: 0.1 10*3/uL (ref 0.0–0.5)
HCT: 40.7 % (ref 34.8–46.6)
HGB: 13 g/dL (ref 11.6–15.9)
LYMPH%: 31.5 % (ref 14.0–49.7)
MCH: 27.8 pg (ref 25.1–34.0)
MCHC: 31.9 g/dL (ref 31.5–36.0)
MCV: 87.2 fL (ref 79.5–101.0)
MONO#: 0.3 10*3/uL (ref 0.1–0.9)
MONO%: 5.1 % (ref 0.0–14.0)
NEUT#: 3.6 10*3/uL (ref 1.5–6.5)
NEUT%: 61.5 % (ref 38.4–76.8)
Platelets: 232 10*3/uL (ref 145–400)
RBC: 4.67 10*6/uL (ref 3.70–5.45)
RDW: 14.7 % — ABNORMAL HIGH (ref 11.2–14.5)
WBC: 5.8 10*3/uL (ref 3.9–10.3)
lymph#: 1.8 10*3/uL (ref 0.9–3.3)

## 2017-03-09 LAB — COMPREHENSIVE METABOLIC PANEL
ALT: 36 U/L (ref 0–55)
AST: 19 U/L (ref 5–34)
Albumin: 3.1 g/dL — ABNORMAL LOW (ref 3.5–5.0)
Alkaline Phosphatase: 117 U/L (ref 40–150)
Anion Gap: 9 mEq/L (ref 3–11)
BUN: 15.8 mg/dL (ref 7.0–26.0)
CO2: 32 mEq/L — ABNORMAL HIGH (ref 22–29)
Calcium: 9.6 mg/dL (ref 8.4–10.4)
Chloride: 101 mEq/L (ref 98–109)
Creatinine: 0.8 mg/dL (ref 0.6–1.1)
EGFR: 73 mL/min/{1.73_m2} — ABNORMAL LOW (ref >90)
Glucose: 186 mg/dl — ABNORMAL HIGH (ref 70–140)
Potassium: 4 mEq/L (ref 3.5–5.1)
Sodium: 142 mEq/L (ref 136–145)
Total Bilirubin: 0.34 mg/dL (ref 0.20–1.20)
Total Protein: 7.2 g/dL (ref 6.4–8.3)

## 2017-03-09 MED ORDER — PALONOSETRON HCL INJECTION 0.25 MG/5ML
INTRAVENOUS | Status: AC
  Filled 2017-03-09: qty 5

## 2017-03-09 MED ORDER — DIPHENHYDRAMINE HCL 25 MG PO CAPS
25.0000 mg | ORAL_CAPSULE | Freq: Once | ORAL | Status: AC
  Administered 2017-03-09: 25 mg via ORAL

## 2017-03-09 MED ORDER — DIPHENHYDRAMINE HCL 25 MG PO CAPS
ORAL_CAPSULE | ORAL | Status: AC
  Filled 2017-03-09: qty 1

## 2017-03-09 MED ORDER — SODIUM CHLORIDE 0.9 % IV SOLN
Freq: Once | INTRAVENOUS | Status: AC
  Administered 2017-03-09: 11:00:00 via INTRAVENOUS

## 2017-03-09 MED ORDER — LORAZEPAM 2 MG/ML IJ SOLN: 0.5000 mg | Freq: Once | INTRAMUSCULAR | Status: DC

## 2017-03-09 MED ORDER — DIPHENHYDRAMINE HCL 25 MG PO CAPS
ORAL_CAPSULE | ORAL | Status: AC
  Filled 2017-03-09: qty 2

## 2017-03-09 MED ORDER — ACETAMINOPHEN 325 MG PO TABS
650.0000 mg | ORAL_TABLET | Freq: Once | ORAL | Status: AC
  Administered 2017-03-09: 650 mg via ORAL

## 2017-03-09 MED ORDER — SODIUM CHLORIDE 0.9 % IJ SOLN
10.0000 mL | INTRAMUSCULAR | Status: DC | PRN
  Administered 2017-03-09 (×2): 10 mL
  Filled 2017-03-09: qty 10

## 2017-03-09 MED ORDER — ACETAMINOPHEN 325 MG PO TABS
ORAL_TABLET | ORAL | Status: AC
  Filled 2017-03-09: qty 2

## 2017-03-09 MED ORDER — DEXAMETHASONE SODIUM PHOSPHATE 10 MG/ML IJ SOLN
INTRAMUSCULAR | Status: AC
  Filled 2017-03-09: qty 1

## 2017-03-09 MED ORDER — SODIUM CHLORIDE 0.9 % IV SOLN
900.0000 mg | Freq: Once | INTRAVENOUS | Status: AC
  Administered 2017-03-09: 900 mg via INTRAVENOUS
  Filled 2017-03-09: qty 42.86

## 2017-03-09 MED ORDER — HEPARIN SOD (PORK) LOCK FLUSH 100 UNIT/ML IV SOLN
500.0000 [IU] | Freq: Once | INTRAVENOUS | Status: AC | PRN
  Administered 2017-03-09: 500 [IU]
  Filled 2017-03-09: qty 5

## 2017-03-09 NOTE — Patient Instructions (Signed)
[facility] Discharge Instructions for Patients Receiving Chemotherapy  Today you received the following chemotherapy agents: Herceptin   To help prevent nausea and vomiting after your treatment, we encourage you to take your nausea medication as directed.    If you develop nausea and vomiting that is not controlled by your nausea medication, call the clinic.   BELOW ARE SYMPTOMS THAT SHOULD BE REPORTED IMMEDIATELY:  *FEVER GREATER THAN 100.5 F  *CHILLS WITH OR WITHOUT FEVER  NAUSEA AND VOMITING THAT IS NOT CONTROLLED WITH YOUR NAUSEA MEDICATION  *UNUSUAL SHORTNESS OF BREATH  *UNUSUAL BRUISING OR BLEEDING  TENDERNESS IN MOUTH AND THROAT WITH OR WITHOUT PRESENCE OF ULCERS  *URINARY PROBLEMS  *BOWEL PROBLEMS  UNUSUAL RASH Items with * indicate a potential emergency and should be followed up as soon as possible.  Feel free to call the clinic you have any questions or concerns. The clinic phone number is [phone].  Please show the CHEMO ALERT CARD at check-in to the Emergency Department and triage nurse.   

## 2017-03-10 LAB — CANCER ANTIGEN 27.29: CA 27.29: 14 U/mL (ref 0.0–38.6)

## 2017-03-12 ENCOUNTER — Other Ambulatory Visit: Payer: Self-pay | Admitting: Family Medicine

## 2017-03-26 ENCOUNTER — Other Ambulatory Visit: Payer: Self-pay | Admitting: Family Medicine

## 2017-03-27 NOTE — Telephone Encounter (Signed)
Called patient to inform her that she needs a f/u with PCP prior to refilling test strips. Patient states she has too many appts right now, relies on others for transportation and is running out of money. States October is soonest she can come in. Appt made for 05/20/2017 at 1130 for f/u T2DM and med refills. Hubbard Hartshorn, RN, BSN

## 2017-03-31 ENCOUNTER — Other Ambulatory Visit: Payer: Self-pay | Admitting: Family Medicine

## 2017-03-31 DIAGNOSIS — I5020 Unspecified systolic (congestive) heart failure: Secondary | ICD-10-CM | POA: Diagnosis not present

## 2017-04-02 ENCOUNTER — Other Ambulatory Visit: Payer: Self-pay | Admitting: Family Medicine
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
## 2017-04-06 ENCOUNTER — Ambulatory Visit (HOSPITAL_BASED_OUTPATIENT_CLINIC_OR_DEPARTMENT_OTHER): Payer: Medicare Other

## 2017-04-06 ENCOUNTER — Other Ambulatory Visit (HOSPITAL_BASED_OUTPATIENT_CLINIC_OR_DEPARTMENT_OTHER): Payer: Medicare Other

## 2017-04-06 ENCOUNTER — Ambulatory Visit (HOSPITAL_BASED_OUTPATIENT_CLINIC_OR_DEPARTMENT_OTHER): Payer: Medicare Other | Admitting: Adult Health

## 2017-04-06 ENCOUNTER — Encounter: Payer: Self-pay | Admitting: Adult Health

## 2017-04-06 VITALS — BP 137/75 | HR 58 | Temp 98.3°F | Resp 18 | Ht 65.0 in | Wt 317.1 lb

## 2017-04-06 DIAGNOSIS — C50212 Malignant neoplasm of upper-inner quadrant of left female breast: Secondary | ICD-10-CM

## 2017-04-06 DIAGNOSIS — M47813 Spondylosis without myelopathy or radiculopathy, cervicothoracic region: Secondary | ICD-10-CM

## 2017-04-06 DIAGNOSIS — Z5112 Encounter for antineoplastic immunotherapy: Secondary | ICD-10-CM | POA: Diagnosis not present

## 2017-04-06 DIAGNOSIS — C7802 Secondary malignant neoplasm of left lung: Secondary | ICD-10-CM

## 2017-04-06 DIAGNOSIS — Z170 Estrogen receptor positive status [ER+]: Secondary | ICD-10-CM

## 2017-04-06 DIAGNOSIS — C50919 Malignant neoplasm of unspecified site of unspecified female breast: Secondary | ICD-10-CM

## 2017-04-06 DIAGNOSIS — C50912 Malignant neoplasm of unspecified site of left female breast: Secondary | ICD-10-CM

## 2017-04-06 DIAGNOSIS — M48061 Spinal stenosis, lumbar region without neurogenic claudication: Secondary | ICD-10-CM

## 2017-04-06 DIAGNOSIS — C7800 Secondary malignant neoplasm of unspecified lung: Principal | ICD-10-CM

## 2017-04-06 DIAGNOSIS — Z86718 Personal history of other venous thrombosis and embolism: Secondary | ICD-10-CM | POA: Diagnosis not present

## 2017-04-06 LAB — CBC WITH DIFFERENTIAL/PLATELET
BASO%: 0.2 % (ref 0.0–2.0)
Basophils Absolute: 0 10*3/uL (ref 0.0–0.1)
EOS%: 2.2 % (ref 0.0–7.0)
Eosinophils Absolute: 0.1 10*3/uL (ref 0.0–0.5)
HCT: 41.3 % (ref 34.8–46.6)
HGB: 13.1 g/dL (ref 11.6–15.9)
LYMPH%: 33.4 % (ref 14.0–49.7)
MCH: 27.6 pg (ref 25.1–34.0)
MCHC: 31.7 g/dL (ref 31.5–36.0)
MCV: 87.1 fL (ref 79.5–101.0)
MONO#: 0.3 10*3/uL (ref 0.1–0.9)
MONO%: 5.3 % (ref 0.0–14.0)
NEUT#: 3.2 10*3/uL (ref 1.5–6.5)
NEUT%: 58.9 % (ref 38.4–76.8)
Platelets: 236 10*3/uL (ref 145–400)
RBC: 4.74 10*6/uL (ref 3.70–5.45)
RDW: 14.7 % — ABNORMAL HIGH (ref 11.2–14.5)
WBC: 5.5 10*3/uL (ref 3.9–10.3)
lymph#: 1.8 10*3/uL (ref 0.9–3.3)

## 2017-04-06 LAB — COMPREHENSIVE METABOLIC PANEL
ALT: 30 U/L (ref 0–55)
AST: 26 U/L (ref 5–34)
Albumin: 3 g/dL — ABNORMAL LOW (ref 3.5–5.0)
Alkaline Phosphatase: 114 U/L (ref 40–150)
Anion Gap: 7 mEq/L (ref 3–11)
BUN: 16.9 mg/dL (ref 7.0–26.0)
CO2: 32 mEq/L — ABNORMAL HIGH (ref 22–29)
Calcium: 9.8 mg/dL (ref 8.4–10.4)
Chloride: 102 mEq/L (ref 98–109)
Creatinine: 0.8 mg/dL (ref 0.6–1.1)
EGFR: 74 mL/min/{1.73_m2} — ABNORMAL LOW (ref >90)
Glucose: 130 mg/dl (ref 70–140)
Potassium: 4.3 mEq/L (ref 3.5–5.1)
Sodium: 141 mEq/L (ref 136–145)
Total Bilirubin: 0.42 mg/dL (ref 0.20–1.20)
Total Protein: 7.1 g/dL (ref 6.4–8.3)

## 2017-04-06 MED ORDER — DIPHENHYDRAMINE HCL 25 MG PO CAPS
ORAL_CAPSULE | ORAL | Status: AC
  Filled 2017-04-06: qty 1

## 2017-04-06 MED ORDER — LORAZEPAM 2 MG/ML IJ SOLN: 0.5000 mg | Freq: Once | INTRAMUSCULAR | Status: DC

## 2017-04-06 MED ORDER — HEPARIN SOD (PORK) LOCK FLUSH 100 UNIT/ML IV SOLN
500.0000 [IU] | Freq: Once | INTRAVENOUS | Status: AC | PRN
  Administered 2017-04-06: 500 [IU]
  Filled 2017-04-06: qty 5

## 2017-04-06 MED ORDER — SODIUM CHLORIDE 0.9 % IJ SOLN
10.0000 mL | INTRAMUSCULAR | Status: DC | PRN
  Administered 2017-04-06: 10 mL
  Filled 2017-04-06: qty 10

## 2017-04-06 MED ORDER — TRASTUZUMAB CHEMO 150 MG IV SOLR
900.0000 mg | Freq: Once | INTRAVENOUS | Status: AC
  Administered 2017-04-06: 900 mg via INTRAVENOUS
  Filled 2017-04-06: qty 42.86

## 2017-04-06 MED ORDER — ACETAMINOPHEN 325 MG PO TABS
ORAL_TABLET | ORAL | Status: AC
  Filled 2017-04-06: qty 2

## 2017-04-06 MED ORDER — ACETAMINOPHEN 325 MG PO TABS
650.0000 mg | ORAL_TABLET | Freq: Once | ORAL | Status: AC
  Administered 2017-04-06: 650 mg via ORAL

## 2017-04-06 MED ORDER — DIPHENHYDRAMINE HCL 25 MG PO CAPS
25.0000 mg | ORAL_CAPSULE | Freq: Once | ORAL | Status: AC
  Administered 2017-04-06: 25 mg via ORAL

## 2017-04-06 MED ORDER — OXYCODONE HCL ER 10 MG PO T12A: 10.0000 mg | EXTENDED_RELEASE_TABLET | Freq: Two times a day (BID) | ORAL | 0 refills | Status: DC

## 2017-04-06 MED ORDER — SODIUM CHLORIDE 0.9 % IV SOLN
Freq: Once | INTRAVENOUS | Status: AC
  Administered 2017-04-06: 11:00:00 via INTRAVENOUS

## 2017-04-06 MED FILL — OxyCONTIN 10 MG T12A: 10 | 30 days supply | Qty: 60 | Fill #0

## 2017-04-06 NOTE — Patient Instructions (Signed)
[facility] Discharge Instructions for Patients Receiving Chemotherapy  Today you received the following chemotherapy agents: Herceptin   To help prevent nausea and vomiting after your treatment, we encourage you to take your nausea medication as directed.    If you develop nausea and vomiting that is not controlled by your nausea medication, call the clinic.   BELOW ARE SYMPTOMS THAT SHOULD BE REPORTED IMMEDIATELY:  *FEVER GREATER THAN 100.5 F  *CHILLS WITH OR WITHOUT FEVER  NAUSEA AND VOMITING THAT IS NOT CONTROLLED WITH YOUR NAUSEA MEDICATION  *UNUSUAL SHORTNESS OF BREATH  *UNUSUAL BRUISING OR BLEEDING  TENDERNESS IN MOUTH AND THROAT WITH OR WITHOUT PRESENCE OF ULCERS  *URINARY PROBLEMS  *BOWEL PROBLEMS  UNUSUAL RASH Items with * indicate a potential emergency and should be followed up as soon as possible.  Feel free to call the clinic you have any questions or concerns. The clinic phone number is [phone].  Please show the CHEMO ALERT CARD at check-in to the Emergency Department and triage nurse.   

## 2017-04-07 LAB — CANCER ANTIGEN 27.29: CA 27.29: 18.6 U/mL (ref 0.0–38.6)

## 2017-04-26 ENCOUNTER — Other Ambulatory Visit: Payer: Self-pay | Admitting: Family Medicine

## 2017-05-01 ENCOUNTER — Other Ambulatory Visit: Payer: Self-pay

## 2017-05-01 DIAGNOSIS — C50919 Malignant neoplasm of unspecified site of unspecified female breast: Secondary | ICD-10-CM

## 2017-05-01 DIAGNOSIS — Z170 Estrogen receptor positive status [ER+]: Secondary | ICD-10-CM

## 2017-05-01 DIAGNOSIS — C7800 Secondary malignant neoplasm of unspecified lung: Principal | ICD-10-CM

## 2017-05-01 DIAGNOSIS — C50212 Malignant neoplasm of upper-inner quadrant of left female breast: Secondary | ICD-10-CM

## 2017-05-01 DIAGNOSIS — I5020 Unspecified systolic (congestive) heart failure: Secondary | ICD-10-CM | POA: Diagnosis not present

## 2017-05-04 ENCOUNTER — Ambulatory Visit (HOSPITAL_BASED_OUTPATIENT_CLINIC_OR_DEPARTMENT_OTHER): Payer: Medicare Other

## 2017-05-04 ENCOUNTER — Other Ambulatory Visit: Payer: Self-pay | Admitting: *Deleted

## 2017-05-04 ENCOUNTER — Other Ambulatory Visit (HOSPITAL_BASED_OUTPATIENT_CLINIC_OR_DEPARTMENT_OTHER): Payer: Medicare Other

## 2017-05-04 VITALS — BP 140/85 | HR 60 | Temp 98.5°F | Resp 20

## 2017-05-04 DIAGNOSIS — C7802 Secondary malignant neoplasm of left lung: Secondary | ICD-10-CM | POA: Diagnosis not present

## 2017-05-04 DIAGNOSIS — Z170 Estrogen receptor positive status [ER+]: Secondary | ICD-10-CM

## 2017-05-04 DIAGNOSIS — C50212 Malignant neoplasm of upper-inner quadrant of left female breast: Secondary | ICD-10-CM

## 2017-05-04 DIAGNOSIS — C50912 Malignant neoplasm of unspecified site of left female breast: Secondary | ICD-10-CM

## 2017-05-04 DIAGNOSIS — M48061 Spinal stenosis, lumbar region without neurogenic claudication: Secondary | ICD-10-CM

## 2017-05-04 DIAGNOSIS — Z5112 Encounter for antineoplastic immunotherapy: Secondary | ICD-10-CM

## 2017-05-04 DIAGNOSIS — C7800 Secondary malignant neoplasm of unspecified lung: Principal | ICD-10-CM

## 2017-05-04 DIAGNOSIS — M47813 Spondylosis without myelopathy or radiculopathy, cervicothoracic region: Secondary | ICD-10-CM

## 2017-05-04 DIAGNOSIS — C50919 Malignant neoplasm of unspecified site of unspecified female breast: Secondary | ICD-10-CM

## 2017-05-04 LAB — COMPREHENSIVE METABOLIC PANEL
ALT: 32 U/L (ref 0–55)
AST: 20 U/L (ref 5–34)
Albumin: 3.1 g/dL — ABNORMAL LOW (ref 3.5–5.0)
Alkaline Phosphatase: 126 U/L (ref 40–150)
Anion Gap: 8 mEq/L (ref 3–11)
BUN: 16.8 mg/dL (ref 7.0–26.0)
CO2: 32 mEq/L — ABNORMAL HIGH (ref 22–29)
Calcium: 9.9 mg/dL (ref 8.4–10.4)
Chloride: 101 mEq/L (ref 98–109)
Creatinine: 0.8 mg/dL (ref 0.6–1.1)
EGFR: 74 mL/min/{1.73_m2} — ABNORMAL LOW (ref >90)
Glucose: 172 mg/dl — ABNORMAL HIGH (ref 70–140)
Potassium: 4.4 mEq/L (ref 3.5–5.1)
Sodium: 141 mEq/L (ref 136–145)
Total Bilirubin: 0.37 mg/dL (ref 0.20–1.20)
Total Protein: 7.4 g/dL (ref 6.4–8.3)

## 2017-05-04 LAB — CBC WITH DIFFERENTIAL/PLATELET
BASO%: 0.5 % (ref 0.0–2.0)
Basophils Absolute: 0 10*3/uL (ref 0.0–0.1)
EOS%: 2.1 % (ref 0.0–7.0)
Eosinophils Absolute: 0.1 10*3/uL (ref 0.0–0.5)
HCT: 41.7 % (ref 34.8–46.6)
HGB: 13.7 g/dL (ref 11.6–15.9)
LYMPH%: 30.8 % (ref 14.0–49.7)
MCH: 27.8 pg (ref 25.1–34.0)
MCHC: 32.9 g/dL (ref 31.5–36.0)
MCV: 84.4 fL (ref 79.5–101.0)
MONO#: 0.4 10*3/uL (ref 0.1–0.9)
MONO%: 6.2 % (ref 0.0–14.0)
NEUT#: 3.9 10*3/uL (ref 1.5–6.5)
NEUT%: 60.4 % (ref 38.4–76.8)
Platelets: 253 10*3/uL (ref 145–400)
RBC: 4.94 10*6/uL (ref 3.70–5.45)
RDW: 15 % — ABNORMAL HIGH (ref 11.2–14.5)
WBC: 6.5 10*3/uL (ref 3.9–10.3)
lymph#: 2 10*3/uL (ref 0.9–3.3)

## 2017-05-04 MED ORDER — ACETAMINOPHEN 325 MG PO TABS
650.0000 mg | ORAL_TABLET | Freq: Once | ORAL | Status: AC
Start: 2017-05-04 — End: 2017-05-04
  Administered 2017-05-04: 650 mg via ORAL

## 2017-05-04 MED ORDER — LORAZEPAM 2 MG/ML IJ SOLN
0.5000 mg | Freq: Once | INTRAMUSCULAR | Status: AC
  Administered 2017-05-04: 0.5 mg via INTRAVENOUS

## 2017-05-04 MED ORDER — DIPHENHYDRAMINE HCL 25 MG PO CAPS
ORAL_CAPSULE | ORAL | Status: AC
  Filled 2017-05-04: qty 1

## 2017-05-04 MED ORDER — LORAZEPAM 2 MG/ML IJ SOLN
INTRAMUSCULAR | Status: AC
  Filled 2017-05-04: qty 1

## 2017-05-04 MED ORDER — OXYCODONE HCL ER 10 MG PO T12A: 10.0000 mg | EXTENDED_RELEASE_TABLET | Freq: Two times a day (BID) | ORAL | 0 refills | Status: DC

## 2017-05-04 MED ORDER — ACETAMINOPHEN 325 MG PO TABS
ORAL_TABLET | ORAL | Status: AC
  Filled 2017-05-04: qty 2

## 2017-05-04 MED ORDER — DIPHENHYDRAMINE HCL 25 MG PO CAPS
25.0000 mg | ORAL_CAPSULE | Freq: Once | ORAL | Status: AC
Start: 2017-05-04 — End: 2017-05-04
  Administered 2017-05-04: 25 mg via ORAL

## 2017-05-04 MED ORDER — SODIUM CHLORIDE 0.9 % IJ SOLN
10.0000 mL | INTRAMUSCULAR | Status: DC | PRN
Start: 2017-05-04 — End: 2017-05-04
  Administered 2017-05-04: 10 mL
  Filled 2017-05-04: qty 10

## 2017-05-04 MED ORDER — TRASTUZUMAB CHEMO 150 MG IV SOLR
900.0000 mg | Freq: Once | INTRAVENOUS | Status: AC
  Administered 2017-05-04: 900 mg via INTRAVENOUS
  Filled 2017-05-04: qty 42.86

## 2017-05-04 MED ORDER — HEPARIN SOD (PORK) LOCK FLUSH 100 UNIT/ML IV SOLN
500.0000 [IU] | Freq: Once | INTRAVENOUS | Status: AC | PRN
  Administered 2017-05-04: 500 [IU]
  Filled 2017-05-04: qty 5

## 2017-05-04 MED ORDER — SODIUM CHLORIDE 0.9 % IV SOLN
Freq: Once | INTRAVENOUS | Status: AC
  Administered 2017-05-04: 10:00:00 via INTRAVENOUS

## 2017-05-04 MED FILL — OxyCONTIN 10 MG T12A: 10 | 30 days supply | Qty: 60 | Fill #0

## 2017-05-04 NOTE — Patient Instructions (Signed)
[facility] Discharge Instructions for Patients Receiving Chemotherapy  Today you received the following chemotherapy agents: Herceptin   To help prevent nausea and vomiting after your treatment, we encourage you to take your nausea medication as directed.    If you develop nausea and vomiting that is not controlled by your nausea medication, call the clinic.   BELOW ARE SYMPTOMS THAT SHOULD BE REPORTED IMMEDIATELY:  *FEVER GREATER THAN 100.5 F  *CHILLS WITH OR WITHOUT FEVER  NAUSEA AND VOMITING THAT IS NOT CONTROLLED WITH YOUR NAUSEA MEDICATION  *UNUSUAL SHORTNESS OF BREATH  *UNUSUAL BRUISING OR BLEEDING  TENDERNESS IN MOUTH AND THROAT WITH OR WITHOUT PRESENCE OF ULCERS  *URINARY PROBLEMS  *BOWEL PROBLEMS  UNUSUAL RASH Items with * indicate a potential emergency and should be followed up as soon as possible.  Feel free to call the clinic you have any questions or concerns. The clinic phone number is [phone].  Please show the CHEMO ALERT CARD at check-in to the Emergency Department and triage nurse.   

## 2017-05-05 ENCOUNTER — Other Ambulatory Visit: Payer: Self-pay | Admitting: Family Medicine

## 2017-05-06 ENCOUNTER — Other Ambulatory Visit: Payer: Self-pay | Admitting: Family Medicine

## 2017-05-15 ENCOUNTER — Other Ambulatory Visit: Payer: Self-pay | Admitting: Family Medicine

## 2017-05-20 ENCOUNTER — Encounter: Payer: Self-pay | Admitting: Family Medicine

## 2017-05-20 ENCOUNTER — Ambulatory Visit (INDEPENDENT_AMBULATORY_CARE_PROVIDER_SITE_OTHER): Payer: Medicare Other | Admitting: Family Medicine

## 2017-05-20 VITALS — BP 152/80 | HR 62 | Temp 98.3°F | Ht 65.0 in | Wt 318.0 lb

## 2017-05-20 DIAGNOSIS — Z23 Encounter for immunization: Secondary | ICD-10-CM

## 2017-05-20 DIAGNOSIS — I10 Essential (primary) hypertension: Secondary | ICD-10-CM

## 2017-05-20 DIAGNOSIS — E1149 Type 2 diabetes mellitus with other diabetic neurological complication: Secondary | ICD-10-CM | POA: Diagnosis not present

## 2017-05-20 LAB — POCT GLYCOSYLATED HEMOGLOBIN (HGB A1C): Hemoglobin A1C: 7

## 2017-05-20 MED ORDER — DICLOFENAC SODIUM 75 MG PO TBEC: 75.0000 mg | DELAYED_RELEASE_TABLET | Freq: Two times a day (BID) | ORAL | 1 refills | Status: DC

## 2017-05-20 NOTE — Patient Instructions (Signed)
Good to see you today!  Thanks for coming in.  Try the diclofenac twice a day for the leg pain  Ask your cancer doctor to check your cholesterol  Vaseline on the feet and the but every day  Come back in 3 months

## 2017-05-20 NOTE — Progress Notes (Addendum)
Subjective  Patient is presenting with the following illnesses   HYPERTENSION Disease Monitoring: Blood pressure range-not checking but is usually controlled at other physician visits Chest pain, palpitations- no      Dyspnea- none new Medications: Compliance- brings in her meds Lightheadedness,Syncope- no   Edema- chronic in left leg  DIABETES Disease Monitoring: Blood Sugar ranges-usually in 100s Polyuria/phagia/dipsia- no      Visual problems- no Medications: Compliance- knows her doses Hypoglycemic symptoms- no  OBESITY Current weight/BMI : Body mass index is 52.92 kg/m.    How long have been obese:  years Course:  Up and down Problems or symptoms it causes:  Back pain   Things have tried to improve:  Cant exercise due to pain.  Has switched to birds eye meals instead of tater tots    Monitoring Labs and Parameters Last A1C:  Lab Results  Component Value Date   HGBA1C 7.0 05/20/2017    Last Lipid:     Component Value Date/Time   CHOL 140 10/27/2012 1653   HDL 56 10/27/2012 1653    Last Bmet  Potassium  Date Value Ref Range Status  05/04/2017 4.4 3.5 - 5.1 mEq/L Final   Sodium  Date Value Ref Range Status  05/04/2017 141 136 - 145 mEq/L Final   Creatinine  Date Value Ref Range Status  05/04/2017 0.8 0.6 - 1.1 mg/dL Final      Last BPs:  BP Readings from Last 3 Encounters:  05/20/17 (!) 152/80  05/04/17 140/85  04/06/17 137/75      Chief Complaint noted Review of Symptoms - see HPI PMH - Smoking status noted.     Objective Vital Signs reviewed  Alert nad Lungs:  Normal respiratory effort, chest expands symmetrically. Lungs are clear to auscultation, no crackles or wheezes Heart - Regular rate and rhythm.  No murmurs, gallops or rubs.    Extrem - left leg is larger than right has been so for months to years. Minimal pitting edema     Assessments/Plans  No problem-specific Assessment & Plan notes found for this encounter. HYPERTENSION,  BENIGN SYSTEMIC BP Readings from Last 3 Encounters:  05/20/17 (!) 152/80  05/04/17 140/85  04/06/17 137/75   Usually at goal.  Will continue current medications   Obesity, morbid, BMI 50 or higher ([place]) Worsened.  She will continue to work on diet.    Diabetes mellitus type 2 with neurological manifestations ([place]) Reasonably well controlled despite somewhat reportedly erractic insulin use     See after visit summary for details of patient instuctions

## 2017-05-21 NOTE — Assessment & Plan Note (Signed)
Reasonably well controlled despite somewhat reportedly erractic insulin use

## 2017-05-21 NOTE — Assessment & Plan Note (Signed)
BP Readings from Last 3 Encounters:  05/20/17 (!) 152/80  05/04/17 140/85  04/06/17 137/75   Usually at goal.  Will continue current medications

## 2017-05-21 NOTE — Assessment & Plan Note (Signed)
Worsened.  She will continue to work on diet.

## 2017-05-28 ENCOUNTER — Ambulatory Visit (HOSPITAL_COMMUNITY): Payer: Medicare Other

## 2017-05-31 DIAGNOSIS — I5020 Unspecified systolic (congestive) heart failure: Secondary | ICD-10-CM | POA: Diagnosis not present

## 2017-06-01 ENCOUNTER — Ambulatory Visit (HOSPITAL_BASED_OUTPATIENT_CLINIC_OR_DEPARTMENT_OTHER): Payer: Medicare Other

## 2017-06-01 ENCOUNTER — Other Ambulatory Visit (HOSPITAL_BASED_OUTPATIENT_CLINIC_OR_DEPARTMENT_OTHER): Payer: Medicare Other

## 2017-06-01 ENCOUNTER — Other Ambulatory Visit: Payer: Self-pay

## 2017-06-01 ENCOUNTER — Ambulatory Visit (HOSPITAL_BASED_OUTPATIENT_CLINIC_OR_DEPARTMENT_OTHER): Payer: Medicare Other | Admitting: Adult Health

## 2017-06-01 VITALS — BP 173/59 | HR 68 | Temp 98.2°F | Resp 24 | Ht 65.0 in | Wt 319.5 lb

## 2017-06-01 DIAGNOSIS — Z86718 Personal history of other venous thrombosis and embolism: Secondary | ICD-10-CM

## 2017-06-01 DIAGNOSIS — C50919 Malignant neoplasm of unspecified site of unspecified female breast: Secondary | ICD-10-CM

## 2017-06-01 DIAGNOSIS — C50212 Malignant neoplasm of upper-inner quadrant of left female breast: Secondary | ICD-10-CM

## 2017-06-01 DIAGNOSIS — R52 Pain, unspecified: Secondary | ICD-10-CM

## 2017-06-01 DIAGNOSIS — C7802 Secondary malignant neoplasm of left lung: Secondary | ICD-10-CM

## 2017-06-01 DIAGNOSIS — M48061 Spinal stenosis, lumbar region without neurogenic claudication: Secondary | ICD-10-CM

## 2017-06-01 DIAGNOSIS — Z170 Estrogen receptor positive status [ER+]: Secondary | ICD-10-CM

## 2017-06-01 DIAGNOSIS — C50912 Malignant neoplasm of unspecified site of left female breast: Secondary | ICD-10-CM

## 2017-06-01 DIAGNOSIS — C7800 Secondary malignant neoplasm of unspecified lung: Secondary | ICD-10-CM

## 2017-06-01 DIAGNOSIS — Z5112 Encounter for antineoplastic immunotherapy: Secondary | ICD-10-CM | POA: Diagnosis not present

## 2017-06-01 DIAGNOSIS — M47813 Spondylosis without myelopathy or radiculopathy, cervicothoracic region: Secondary | ICD-10-CM

## 2017-06-01 DIAGNOSIS — Z5111 Encounter for antineoplastic chemotherapy: Secondary | ICD-10-CM

## 2017-06-01 LAB — COMPREHENSIVE METABOLIC PANEL
ALT: 35 U/L (ref 0–55)
AST: 22 U/L (ref 5–34)
Albumin: 3.2 g/dL — ABNORMAL LOW (ref 3.5–5.0)
Alkaline Phosphatase: 117 U/L (ref 40–150)
Anion Gap: 7 mEq/L (ref 3–11)
BUN: 17.6 mg/dL (ref 7.0–26.0)
CO2: 33 mEq/L — ABNORMAL HIGH (ref 22–29)
Calcium: 9.4 mg/dL (ref 8.4–10.4)
Chloride: 105 mEq/L (ref 98–109)
Creatinine: 0.8 mg/dL (ref 0.6–1.1)
EGFR: >60 mL/min/{1.73_m2} (ref >60)
Glucose: 154 mg/dl — ABNORMAL HIGH (ref 70–140)
Potassium: 3.9 mEq/L (ref 3.5–5.1)
Sodium: 144 mEq/L (ref 136–145)
Total Bilirubin: 0.31 mg/dL (ref 0.20–1.20)
Total Protein: 7.2 g/dL (ref 6.4–8.3)

## 2017-06-01 LAB — CBC WITH DIFFERENTIAL/PLATELET
BASO%: 0.2 % (ref 0.0–2.0)
Basophils Absolute: 0 10*3/uL (ref 0.0–0.1)
EOS%: 2.5 % (ref 0.0–7.0)
Eosinophils Absolute: 0.1 10*3/uL (ref 0.0–0.5)
HCT: 39.9 % (ref 34.8–46.6)
HGB: 13.1 g/dL (ref 11.6–15.9)
LYMPH%: 31.3 % (ref 14.0–49.7)
MCH: 27.8 pg (ref 25.1–34.0)
MCHC: 32.8 g/dL (ref 31.5–36.0)
MCV: 84.9 fL (ref 79.5–101.0)
MONO#: 0.3 10*3/uL (ref 0.1–0.9)
MONO%: 6.2 % (ref 0.0–14.0)
NEUT#: 3.2 10*3/uL (ref 1.5–6.5)
NEUT%: 59.8 % (ref 38.4–76.8)
Platelets: 226 10*3/uL (ref 145–400)
RBC: 4.7 10*6/uL (ref 3.70–5.45)
RDW: 15.1 % — ABNORMAL HIGH (ref 11.2–14.5)
WBC: 5.4 10*3/uL (ref 3.9–10.3)
lymph#: 1.7 10*3/uL (ref 0.9–3.3)

## 2017-06-01 MED ORDER — METHADONE HCL 5 MG PO TABS: 2.5000 mg | ORAL_TABLET | Freq: Three times a day (TID) | ORAL | 0 refills | Status: DC

## 2017-06-01 MED ORDER — LORAZEPAM 2 MG/ML IJ SOLN
0.5000 mg | Freq: Once | INTRAMUSCULAR | Status: AC
  Administered 2017-06-01: 0.5 mg via INTRAVENOUS

## 2017-06-01 MED ORDER — ACETAMINOPHEN 325 MG PO TABS
ORAL_TABLET | ORAL | Status: AC
  Filled 2017-06-01: qty 2

## 2017-06-01 MED ORDER — SODIUM CHLORIDE 0.9 % IV SOLN
Freq: Once | INTRAVENOUS | Status: AC
  Administered 2017-06-01: 11:00:00 via INTRAVENOUS

## 2017-06-01 MED ORDER — HEPARIN SOD (PORK) LOCK FLUSH 100 UNIT/ML IV SOLN
500.0000 [IU] | Freq: Once | INTRAVENOUS | Status: AC | PRN
  Administered 2017-06-01: 500 [IU]
  Filled 2017-06-01: qty 5

## 2017-06-01 MED ORDER — TRASTUZUMAB CHEMO 150 MG IV SOLR
900.0000 mg | Freq: Once | INTRAVENOUS | Status: AC
  Administered 2017-06-01: 900 mg via INTRAVENOUS
  Filled 2017-06-01: qty 42.86

## 2017-06-01 MED ORDER — ACETAMINOPHEN 325 MG PO TABS
650.0000 mg | ORAL_TABLET | Freq: Once | ORAL | Status: AC
  Administered 2017-06-01: 650 mg via ORAL

## 2017-06-01 MED ORDER — DIPHENHYDRAMINE HCL 25 MG PO CAPS
ORAL_CAPSULE | ORAL | Status: AC
  Filled 2017-06-01: qty 1

## 2017-06-01 MED ORDER — SODIUM CHLORIDE 0.9 % IJ SOLN
10.0000 mL | INTRAMUSCULAR | Status: DC | PRN
  Administered 2017-06-01: 10 mL
  Filled 2017-06-01: qty 10

## 2017-06-01 MED ORDER — DIPHENHYDRAMINE HCL 25 MG PO CAPS
25.0000 mg | ORAL_CAPSULE | Freq: Once | ORAL | Status: AC
  Administered 2017-06-01: 25 mg via ORAL

## 2017-06-01 MED ORDER — LORAZEPAM 2 MG/ML IJ SOLN
INTRAMUSCULAR | Status: AC
  Filled 2017-06-01: qty 1

## 2017-06-01 NOTE — Patient Instructions (Signed)
Methadone tablets What is this medicine? METHADONE (METH a done) is a pain reliever. It is used to treat severe pain. The medicine is also used to prevent withdrawal symptoms in people addicted to other drugs. This medicine may be used for other purposes; ask your health care provider or pharmacist if you have questions. COMMON BRAND NAME(S): Dolophine, Methadose What should I tell my health care provider before I take this medicine? They need to know if you have any of these conditions: -Addison's disease -brain tumor -drug abuse or addiction -gallbladder disease -head injury -history of irregular heartbeat -if you often drink alcohol -kidney disease -liver disease -low blood pressure -lung or breathing disease, like asthma -mental illness -problems urinating -seizures -stomach or intestine problems -thyroid disease -an unusual or allergic reaction to methadone, other opioid analgesics, other medicines, foods, dyes, or preservatives -pregnant or trying to get pregnant -breast-feeding How should I use this medicine? Take this medicine by mouth with a drink of water. If the medicine upsets your stomach, take it with food or milk. Follow the directions on the prescription label. Do not take more medicine than you are told to take. A special MedGuide will be given to you by the pharmacist with each prescription and refill. Be sure to read this information carefully each time. Talk to your pediatrician regarding the use of this medicine in children. Special care may be needed. Overdosage: If you think you have taken too much of this medicine contact a poison control center or emergency room at once. NOTE: This medicine is only for you. Do not share this medicine with others. What if I miss a dose? If you miss a dose, take it as soon as you can. If it is almost time for your next dose, take only that dose. Do not take double or extra doses. What may interact with this medicine? Do not  take this medicine with any of the following medications: -certain medicines for fungal infections like itraconazole, ketoconazole, posaconazole, voriconazole -certain medicines for irregular heart beat like bepridil, bretylium, dofetilide, dronedarone, quinidine -cisapride -halofantrine -mesoridazine -pimozide -rasagiline -selegiline -thioridazine -ziprasidone This medicine may also interact with the following medications: -alcohol -antihistamines for allergy, cough and cold -antiviral medicines for HIV or AIDS -arsenic trioxide -atropine -certain antibiotics like clarithromycin, erythromycin, gemifloxacin, levofloxacin, moxifloxacin, ofloxacin, pentamidine, telithromycin, rifampin, rifapentine -certain medicines for anxiety or sleep -certain medicines for bladder problems like oxybutynin, tolterodine -certain medicines for cancer like dasatinib, lapatinib, sunitinib, vorinostat -certain medicines for depression like amitriptyline, desipramine, fluoxetine, sertraline -certain medicines for irregular heart beat like amiodarone, disopyramide, flecainide, procainamide, propafenone, sotalol -certain medicines for malaria like chloroquine, mefloquine -certain medicines for migraine headache like almotriptan, eletriptan, frovatriptan, naratriptan, rizatriptan, sumatriptan, zolmitriptan -certain medicines for nausea or vomiting like dolasetron, droperidol, granisetron, ondansetron -certain medicines for seizures like carbamazepine, phenobarbital, phenytoin, primidone -certain medicines for stomach problems like dicyclomine, hyoscyamine -certain medicines for travel sickness like scopolamine -certain medicines for Parkinson's disease like benztropine, trihexyphenidyl -fluconazole -general anesthetics like halothane, isoflurane, methoxyflurane, propofol -haloperidol -ipratropium -linezolid -local anesthetics like lidocaine, pramoxine, tetracaine -MAOIs like Marplan, Nardil, and  Parnate -medicines that relax muscles for surgery -methylene blue -octreotide -other medicines that prolong the QT interval (cause an abnormal heart rhythm) -other narcotic medicines for pain or cough -peginterferon alfa-2b -phenothiazines like chlorpromazine, prochlorperazine -ranolazine -tacrolimus -vardenafil This list may not describe all possible interactions. Give your health care provider a list of all the medicines, herbs, non-prescription drugs, or dietary supplements you use. Also tell them if  you smoke, drink alcohol, or use illegal drugs. Some items may interact with your medicine. What should I watch for while using this medicine? Tell your doctor or health care professional if your pain does not go away, if it gets worse, or if you have new or a different type of pain. You may develop tolerance to the medicine. Tolerance means that you will need a higher dose of the medicine for pain relief. Tolerance is normal and is expected if you take this medicine for a long time. Do not suddenly stop taking your medicine because you may develop a severe reaction. Your body becomes used to the medicine. This does NOT mean you are addicted. Addiction is a behavior related to getting and using a drug for a non-medical reason. If you have pain, you have a medical reason to take pain medicine. Your doctor will tell you how much medicine to take. If your doctor wants you to stop the medicine, the dose will be slowly lowered over time to avoid any side effects. There are different types of narcotic medicines (opiates). If you take more than one type at the same time or if you are taking another medicine that also causes drowsiness, you may have more side effects. Give your health care provider a list of all medicines you use. Your doctor will tell you how much medicine to take. Do not take more medicine than directed. Call emergency for help if you have problems breathing or unusual sleepiness. You may  get drowsy or dizzy. Do not drive, use machinery, or do anything that needs mental alertness until you know how this medicine affects you. Do not stand or sit up quickly, especially if you are an older patient. This reduces the risk of dizzy or fainting spells. Alcohol may interfere with the effect of this medicine. Avoid alcoholic drinks. This medicine will cause constipation. Try to have a bowel movement at least every 2 to 3 days. If you do not have a bowel movement for 3 days, call your doctor or health care professional. Your mouth may get dry. Chewing sugarless gum or sucking hard candy, and drinking plenty of water may help. Contact your doctor if the problem does not go away or is severe. Women should inform their doctor if they wish to become pregnant or think they might be pregnant. There is a potential for serious side effects to an unborn child. Talk to your doctor about the benefits and risks of breast-feeding while using this medicine. This medicine does pass into breast milk. Talk to your doctor if you plan to begin or stop using this medicine while breast-feeding or if you plan to stop breast-feeding. What side effects may I notice from receiving this medicine? Side effects that you should report to your doctor or health care professional as soon as possible: -allergic reactions like skin rash, itching or hives, swelling of the face, lips, or tongue -breathing problems -confusion -signs and symptoms of a dangerous change in heartbeat or heart rhythm like chest pain; dizziness; fast or irregular heartbeat; palpitations; feeling faint or lightheaded, falls; breathing problems -signs and symptoms of adrenal insufficiency like nausea; vomiting; loss of appetite; fatigue; weakness; dizziness; low blood pressure -signs and symptoms of low blood pressure like dizziness; feeling faint or lightheaded, falls; unusually weak or tired -signs and symptoms of serotonin syndrome like agitation;  confusion; diarrhea; fast or irregular heartbeat; muscle twitching; stiff muscles; trouble walking; increased sweating; high fever; seizures; shivering; vomiting -trouble passing urine or  change in the amount of urine Side effects that usually do not require medical attention (report to your doctor or health care professional if they continue or are bothersome): -constipation -dry mouth -nausea, vomiting -tiredness This list may not describe all possible side effects. Call your doctor for medical advice about side effects. You may report side effects to FDA at 1-800-FDA-1088. Where should I keep my medicine? Keep out of the reach of children. This medicine can be abused. Keep your medicine in a safe place to protect it from theft. Do not share this medicine with anyone. Selling or giving away this medicine is dangerous and is against the law. Store at room temperature between 15 and 30 degrees C (59 and 86 degrees F). Keep container tightly closed. This medicine may cause accidental overdose and death if it is taken by other adults, children, or pets. Flush any unused medicine down the toilet to reduce the chance of harm. Do not use the medicine after the expiration date. NOTE: This sheet is a summary. It may not cover all possible information. If you have questions about this medicine, talk to your doctor, pharmacist, or health care provider.  2018 Elsevier/Gold Standard (2015-10-29 10:45:42)
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
## 2017-06-01 NOTE — Patient Instructions (Signed)
[facility] Discharge Instructions for Patients Receiving Chemotherapy  Today you received the following chemotherapy agents herceptin.  To help prevent nausea and vomiting after your treatment, we encourage you to take your nausea medication as directed.   If you develop nausea and vomiting that is not controlled by your nausea medication, call the clinic.   BELOW ARE SYMPTOMS THAT SHOULD BE REPORTED IMMEDIATELY:  *FEVER GREATER THAN 100.5 F  *CHILLS WITH OR WITHOUT FEVER  NAUSEA AND VOMITING THAT IS NOT CONTROLLED WITH YOUR NAUSEA MEDICATION  *UNUSUAL SHORTNESS OF BREATH  *UNUSUAL BRUISING OR BLEEDING  TENDERNESS IN MOUTH AND THROAT WITH OR WITHOUT PRESENCE OF ULCERS  *URINARY PROBLEMS  *BOWEL PROBLEMS  UNUSUAL RASH Items with * indicate a potential emergency and should be followed up as soon as possible.  Feel free to call the clinic should you have any questions or concerns. The clinic phone number is [phone].  Please show the CHEMO ALERT CARD at check-in to the Emergency Department and triage nurse.   

## 2017-06-02 LAB — CANCER ANTIGEN 27.29: CA 27.29: 20.1 U/mL (ref 0.0–38.6)

## 2017-06-03 ENCOUNTER — Encounter: Payer: Self-pay | Admitting: Family Medicine

## 2017-06-10 ENCOUNTER — Ambulatory Visit (HOSPITAL_COMMUNITY): Payer: Medicare Other

## 2017-06-10 ENCOUNTER — Encounter (HOSPITAL_COMMUNITY): Payer: Self-pay

## 2017-06-10 ENCOUNTER — Encounter (HOSPITAL_COMMUNITY): Payer: Medicare Other

## 2017-06-10 ENCOUNTER — Encounter (HOSPITAL_COMMUNITY)
Admission: RE | Admit: 2017-06-10 | Discharge: 2017-06-10 | Disposition: A | Discharge status: Home / Self Care | Payer: Medicare Other | Source: Ambulatory Visit | Attending: Oncology | Admitting: Oncology

## 2017-06-10 ENCOUNTER — Other Ambulatory Visit (HOSPITAL_COMMUNITY): Payer: Medicare Other

## 2017-06-10 DIAGNOSIS — C50912 Malignant neoplasm of unspecified site of left female breast: Secondary | ICD-10-CM

## 2017-06-10 DIAGNOSIS — M48061 Spinal stenosis, lumbar region without neurogenic claudication: Secondary | ICD-10-CM | POA: Diagnosis not present

## 2017-06-10 DIAGNOSIS — C50212 Malignant neoplasm of upper-inner quadrant of left female breast: Secondary | ICD-10-CM | POA: Diagnosis not present

## 2017-06-10 DIAGNOSIS — C7802 Secondary malignant neoplasm of left lung: Secondary | ICD-10-CM | POA: Diagnosis not present

## 2017-06-10 DIAGNOSIS — C7800 Secondary malignant neoplasm of unspecified lung: Secondary | ICD-10-CM | POA: Diagnosis not present

## 2017-06-10 DIAGNOSIS — R918 Other nonspecific abnormal finding of lung field: Secondary | ICD-10-CM | POA: Diagnosis not present

## 2017-06-10 DIAGNOSIS — C50919 Malignant neoplasm of unspecified site of unspecified female breast: Secondary | ICD-10-CM

## 2017-06-10 DIAGNOSIS — Z170 Estrogen receptor positive status [ER+]: Secondary | ICD-10-CM | POA: Insufficient documentation

## 2017-06-10 DIAGNOSIS — M47813 Spondylosis without myelopathy or radiculopathy, cervicothoracic region: Secondary | ICD-10-CM

## 2017-06-12 ENCOUNTER — Encounter (HOSPITAL_COMMUNITY): Payer: Medicare Other

## 2017-06-22 ENCOUNTER — Other Ambulatory Visit: Payer: Self-pay | Admitting: Family Medicine
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
## 2017-06-29 ENCOUNTER — Telehealth: Payer: Self-pay | Admitting: Oncology

## 2017-06-29 ENCOUNTER — Other Ambulatory Visit (HOSPITAL_BASED_OUTPATIENT_CLINIC_OR_DEPARTMENT_OTHER): Payer: Medicare Other

## 2017-06-29 ENCOUNTER — Telehealth: Payer: Self-pay | Admitting: *Deleted

## 2017-06-29 ENCOUNTER — Ambulatory Visit (HOSPITAL_BASED_OUTPATIENT_CLINIC_OR_DEPARTMENT_OTHER): Payer: Medicare Other

## 2017-06-29 ENCOUNTER — Ambulatory Visit (HOSPITAL_BASED_OUTPATIENT_CLINIC_OR_DEPARTMENT_OTHER): Payer: Medicare Other | Admitting: Oncology

## 2017-06-29 VITALS — BP 134/67 | HR 70 | Temp 97.9°F | Resp 24 | Ht 65.0 in | Wt 314.4 lb

## 2017-06-29 DIAGNOSIS — Z170 Estrogen receptor positive status [ER+]: Secondary | ICD-10-CM

## 2017-06-29 DIAGNOSIS — Z9221 Personal history of antineoplastic chemotherapy: Secondary | ICD-10-CM | POA: Diagnosis not present

## 2017-06-29 DIAGNOSIS — C50212 Malignant neoplasm of upper-inner quadrant of left female breast: Secondary | ICD-10-CM

## 2017-06-29 DIAGNOSIS — C50919 Malignant neoplasm of unspecified site of unspecified female breast: Secondary | ICD-10-CM

## 2017-06-29 DIAGNOSIS — C7800 Secondary malignant neoplasm of unspecified lung: Secondary | ICD-10-CM

## 2017-06-29 DIAGNOSIS — C7802 Secondary malignant neoplasm of left lung: Secondary | ICD-10-CM

## 2017-06-29 DIAGNOSIS — R52 Pain, unspecified: Secondary | ICD-10-CM | POA: Diagnosis not present

## 2017-06-29 DIAGNOSIS — Z5112 Encounter for antineoplastic immunotherapy: Secondary | ICD-10-CM | POA: Diagnosis not present

## 2017-06-29 DIAGNOSIS — Z79811 Long term (current) use of aromatase inhibitors: Secondary | ICD-10-CM

## 2017-06-29 DIAGNOSIS — Z86718 Personal history of other venous thrombosis and embolism: Secondary | ICD-10-CM

## 2017-06-29 LAB — CBC WITH DIFFERENTIAL/PLATELET
BASO%: 0.5 % (ref 0.0–2.0)
Basophils Absolute: 0 10*3/uL (ref 0.0–0.1)
EOS%: 2.1 % (ref 0.0–7.0)
Eosinophils Absolute: 0.1 10*3/uL (ref 0.0–0.5)
HCT: 38.2 % (ref 34.8–46.6)
HGB: 12.5 g/dL (ref 11.6–15.9)
LYMPH%: 26.6 % (ref 14.0–49.7)
MCH: 27.6 pg (ref 25.1–34.0)
MCHC: 32.7 g/dL (ref 31.5–36.0)
MCV: 84.5 fL (ref 79.5–101.0)
MONO#: 0.4 10*3/uL (ref 0.1–0.9)
MONO%: 6.2 % (ref 0.0–14.0)
NEUT#: 3.8 10*3/uL (ref 1.5–6.5)
NEUT%: 64.6 % (ref 38.4–76.8)
Platelets: 251 10*3/uL (ref 145–400)
RBC: 4.52 10*6/uL (ref 3.70–5.45)
RDW: 14.8 % — ABNORMAL HIGH (ref 11.2–14.5)
WBC: 5.8 10*3/uL (ref 3.9–10.3)
lymph#: 1.5 10*3/uL (ref 0.9–3.3)

## 2017-06-29 LAB — COMPREHENSIVE METABOLIC PANEL
ALT: 51 U/L (ref 0–55)
AST: 29 U/L (ref 5–34)
Albumin: 3 g/dL — ABNORMAL LOW (ref 3.5–5.0)
Alkaline Phosphatase: 136 U/L (ref 40–150)
Anion Gap: 8 mEq/L (ref 3–11)
BUN: 17.9 mg/dL (ref 7.0–26.0)
CO2: 34 mEq/L — ABNORMAL HIGH (ref 22–29)
Calcium: 9.5 mg/dL (ref 8.4–10.4)
Chloride: 99 mEq/L (ref 98–109)
Creatinine: 0.8 mg/dL (ref 0.6–1.1)
EGFR: >60 mL/min/{1.73_m2} (ref >60)
Glucose: 200 mg/dl — ABNORMAL HIGH (ref 70–140)
Potassium: 4 mEq/L (ref 3.5–5.1)
Sodium: 140 mEq/L (ref 136–145)
Total Bilirubin: 0.35 mg/dL (ref 0.20–1.20)
Total Protein: 7.1 g/dL (ref 6.4–8.3)

## 2017-06-29 MED ORDER — LORAZEPAM 2 MG/ML IJ SOLN
INTRAMUSCULAR | Status: AC
  Filled 2017-06-29: qty 1

## 2017-06-29 MED ORDER — ACETAMINOPHEN 325 MG PO TABS
650.0000 mg | ORAL_TABLET | Freq: Once | ORAL | Status: AC
  Administered 2017-06-29: 650 mg via ORAL

## 2017-06-29 MED ORDER — TRASTUZUMAB CHEMO 150 MG IV SOLR
900.0000 mg | Freq: Once | INTRAVENOUS | Status: AC
  Administered 2017-06-29: 900 mg via INTRAVENOUS
  Filled 2017-06-29: qty 42.86

## 2017-06-29 MED ORDER — DIPHENHYDRAMINE HCL 25 MG PO CAPS
ORAL_CAPSULE | ORAL | Status: AC
  Filled 2017-06-29: qty 1

## 2017-06-29 MED ORDER — SODIUM CHLORIDE 0.9 % IV SOLN
Freq: Once | INTRAVENOUS | Status: AC
  Administered 2017-06-29: 11:00:00 via INTRAVENOUS

## 2017-06-29 MED ORDER — DIPHENHYDRAMINE HCL 25 MG PO CAPS
25.0000 mg | ORAL_CAPSULE | Freq: Once | ORAL | Status: AC
  Administered 2017-06-29: 25 mg via ORAL

## 2017-06-29 MED ORDER — HEPARIN SOD (PORK) LOCK FLUSH 100 UNIT/ML IV SOLN
500.0000 [IU] | Freq: Once | INTRAVENOUS | Status: AC | PRN
  Administered 2017-06-29: 500 [IU]
  Filled 2017-06-29: qty 5

## 2017-06-29 MED ORDER — METHADONE HCL 5 MG PO TABS: 5.0000 mg | ORAL_TABLET | Freq: Three times a day (TID) | ORAL | 0 refills | Status: DC

## 2017-06-29 MED ORDER — ACETAMINOPHEN 325 MG PO TABS
ORAL_TABLET | ORAL | Status: AC
  Filled 2017-06-29: qty 2

## 2017-06-29 MED ORDER — LORAZEPAM 2 MG/ML IJ SOLN: 0.5000 mg | Freq: Once | INTRAMUSCULAR | Status: DC

## 2017-06-29 MED ORDER — SODIUM CHLORIDE 0.9 % IJ SOLN
10.0000 mL | INTRAMUSCULAR | Status: DC | PRN
  Administered 2017-06-29: 10 mL
  Filled 2017-06-29: qty 10

## 2017-06-29 MED ORDER — LETROZOLE 2.5 MG PO TABS: 2.5000 mg | ORAL_TABLET | Freq: Every day | ORAL | 6 refills | Status: DC

## 2017-06-29 MED FILL — METHADONE HCL 5 MG TABLET: 5 | 30 days supply | Qty: 90 | Fill #0

## 2017-06-29 NOTE — Patient Instructions (Signed)
[facility] Discharge Instructions for Patients Receiving Chemotherapy Today you received the following chemotherapy agents:  Herceptin To help prevent nausea and vomiting after your treatment, we encourage you to take your nausea medication as prescribed.   If you develop nausea and vomiting that is not controlled by your nausea medication, call the clinic.   BELOW ARE SYMPTOMS THAT SHOULD BE REPORTED IMMEDIATELY:  *FEVER GREATER THAN 100.5 F  *CHILLS WITH OR WITHOUT FEVER  NAUSEA AND VOMITING THAT IS NOT CONTROLLED WITH YOUR NAUSEA MEDICATION  *UNUSUAL SHORTNESS OF BREATH  *UNUSUAL BRUISING OR BLEEDING  TENDERNESS IN MOUTH AND THROAT WITH OR WITHOUT PRESENCE OF ULCERS  *URINARY PROBLEMS  *BOWEL PROBLEMS  UNUSUAL RASH Items with * indicate a potential emergency and should be followed up as soon as possible.  Feel free to call the clinic should you have any questions or concerns. The clinic phone number is [phone].  Please show the CHEMO ALERT CARD at check-in to the Emergency Department and triage nurse.   

## 2017-06-29 NOTE — Telephone Encounter (Signed)
Per MD proceed with treatment today - ECHO scheduled for follow up later this week.Marland Kitchen

## 2017-06-29 NOTE — Telephone Encounter (Signed)
Gave patient avs report and appointments for November thru February

## 2017-06-30 LAB — CANCER ANTIGEN 27.29: CA 27.29: 20 U/mL (ref 0.0–38.6)

## 2017-07-01 DIAGNOSIS — I5020 Unspecified systolic (congestive) heart failure: Secondary | ICD-10-CM | POA: Diagnosis not present

## 2017-07-02 ENCOUNTER — Ambulatory Visit (HOSPITAL_COMMUNITY)
Admission: RE | Admit: 2017-07-02 | Discharge: 2017-07-02 | Disposition: A | Discharge status: Home / Self Care | Payer: Medicare Other | Source: Ambulatory Visit | Attending: Adult Health | Admitting: Adult Health

## 2017-07-02 DIAGNOSIS — Z5111 Encounter for antineoplastic chemotherapy: Secondary | ICD-10-CM

## 2017-07-02 MED ORDER — PERFLUTREN LIPID MICROSPHERE
1.0000 mL | INTRAVENOUS | Status: AC | PRN
  Administered 2017-07-02: 2 mL via INTRAVENOUS
  Filled 2017-07-02: qty 10

## 2017-07-02 MED ORDER — PERFLUTREN LIPID MICROSPHERE
INTRAVENOUS | Status: AC
  Filled 2017-07-02: qty 10

## 2017-07-02 NOTE — Progress Notes (Signed)
  Echocardiogram 2D Echocardiogram with Definity has been performed.  [name] 07/02/2017, 11:52 AM

## 2017-07-27 ENCOUNTER — Ambulatory Visit: Payer: Medicare Other

## 2017-07-27 ENCOUNTER — Ambulatory Visit: Payer: Medicare Other | Admitting: Adult Health

## 2017-07-27 ENCOUNTER — Other Ambulatory Visit: Payer: Medicare Other

## 2017-07-31 DIAGNOSIS — I5020 Unspecified systolic (congestive) heart failure: Secondary | ICD-10-CM | POA: Diagnosis not present

## 2017-08-05 ENCOUNTER — Other Ambulatory Visit: Payer: Self-pay | Admitting: *Deleted

## 2017-08-05 ENCOUNTER — Ambulatory Visit (HOSPITAL_BASED_OUTPATIENT_CLINIC_OR_DEPARTMENT_OTHER): Payer: Medicare Other

## 2017-08-05 ENCOUNTER — Other Ambulatory Visit (HOSPITAL_BASED_OUTPATIENT_CLINIC_OR_DEPARTMENT_OTHER): Payer: Medicare Other

## 2017-08-05 ENCOUNTER — Encounter: Payer: Self-pay | Admitting: Adult Health

## 2017-08-05 ENCOUNTER — Ambulatory Visit (HOSPITAL_BASED_OUTPATIENT_CLINIC_OR_DEPARTMENT_OTHER): Payer: Medicare Other | Admitting: Adult Health

## 2017-08-05 VITALS — BP 146/66 | HR 74 | Temp 98.2°F | Resp 18 | Ht 65.0 in | Wt 311.7 lb

## 2017-08-05 DIAGNOSIS — M5442 Lumbago with sciatica, left side: Secondary | ICD-10-CM | POA: Diagnosis not present

## 2017-08-05 DIAGNOSIS — C7802 Secondary malignant neoplasm of left lung: Secondary | ICD-10-CM

## 2017-08-05 DIAGNOSIS — C50911 Malignant neoplasm of unspecified site of right female breast: Secondary | ICD-10-CM

## 2017-08-05 DIAGNOSIS — C50212 Malignant neoplasm of upper-inner quadrant of left female breast: Secondary | ICD-10-CM

## 2017-08-05 DIAGNOSIS — Z170 Estrogen receptor positive status [ER+]: Secondary | ICD-10-CM

## 2017-08-05 DIAGNOSIS — E0841 Diabetes mellitus due to underlying condition with diabetic mononeuropathy: Secondary | ICD-10-CM

## 2017-08-05 DIAGNOSIS — C50919 Malignant neoplasm of unspecified site of unspecified female breast: Secondary | ICD-10-CM

## 2017-08-05 DIAGNOSIS — G8929 Other chronic pain: Secondary | ICD-10-CM | POA: Diagnosis not present

## 2017-08-05 DIAGNOSIS — M4710 Other spondylosis with myelopathy, site unspecified: Secondary | ICD-10-CM

## 2017-08-05 DIAGNOSIS — M48061 Spinal stenosis, lumbar region without neurogenic claudication: Secondary | ICD-10-CM

## 2017-08-05 DIAGNOSIS — Z7189 Other specified counseling: Secondary | ICD-10-CM | POA: Insufficient documentation

## 2017-08-05 DIAGNOSIS — C7800 Secondary malignant neoplasm of unspecified lung: Secondary | ICD-10-CM

## 2017-08-05 DIAGNOSIS — M5441 Lumbago with sciatica, right side: Secondary | ICD-10-CM

## 2017-08-05 DIAGNOSIS — Z86718 Personal history of other venous thrombosis and embolism: Secondary | ICD-10-CM

## 2017-08-05 DIAGNOSIS — Z5112 Encounter for antineoplastic immunotherapy: Secondary | ICD-10-CM | POA: Diagnosis not present

## 2017-08-05 DIAGNOSIS — M79605 Pain in left leg: Secondary | ICD-10-CM

## 2017-08-05 LAB — CBC WITH DIFFERENTIAL/PLATELET
BASO%: 0.5 % (ref 0.0–2.0)
Basophils Absolute: 0 10*3/uL (ref 0.0–0.1)
EOS%: 1.5 % (ref 0.0–7.0)
Eosinophils Absolute: 0.1 10*3/uL (ref 0.0–0.5)
HCT: 39.8 % (ref 34.8–46.6)
HGB: 12.8 g/dL (ref 11.6–15.9)
LYMPH%: 34.5 % (ref 14.0–49.7)
MCH: 27.9 pg (ref 25.1–34.0)
MCHC: 32.2 g/dL (ref 31.5–36.0)
MCV: 86.7 fL (ref 79.5–101.0)
MONO#: 0.4 10*3/uL (ref 0.1–0.9)
MONO%: 6 % (ref 0.0–14.0)
NEUT#: 3.8 10*3/uL (ref 1.5–6.5)
NEUT%: 57.5 % (ref 38.4–76.8)
Platelets: 267 10*3/uL (ref 145–400)
RBC: 4.59 10*6/uL (ref 3.70–5.45)
RDW: 14.3 % (ref 11.2–14.5)
WBC: 6.5 10*3/uL (ref 3.9–10.3)
lymph#: 2.3 10*3/uL (ref 0.9–3.3)

## 2017-08-05 LAB — COMPREHENSIVE METABOLIC PANEL
ALT: 28 U/L (ref 0–55)
AST: 19 U/L (ref 5–34)
Albumin: 3.2 g/dL — ABNORMAL LOW (ref 3.5–5.0)
Alkaline Phosphatase: 108 U/L (ref 40–150)
Anion Gap: 9 mEq/L (ref 3–11)
BUN: 22.7 mg/dL (ref 7.0–26.0)
CO2: 31 mEq/L — ABNORMAL HIGH (ref 22–29)
Calcium: 9.7 mg/dL (ref 8.4–10.4)
Chloride: 101 mEq/L (ref 98–109)
Creatinine: 0.8 mg/dL (ref 0.6–1.1)
EGFR: >60 mL/min/{1.73_m2} (ref >60)
Glucose: 125 mg/dl (ref 70–140)
Potassium: 4 mEq/L (ref 3.5–5.1)
Sodium: 141 mEq/L (ref 136–145)
Total Bilirubin: 0.34 mg/dL (ref 0.20–1.20)
Total Protein: 7.3 g/dL (ref 6.4–8.3)

## 2017-08-05 MED ORDER — SODIUM CHLORIDE 0.9 % IV SOLN
Freq: Once | INTRAVENOUS | Status: AC
  Administered 2017-08-05: 12:00:00 via INTRAVENOUS

## 2017-08-05 MED ORDER — ACETAMINOPHEN 325 MG PO TABS
ORAL_TABLET | ORAL | Status: AC
Start: 2017-08-05 — End: 2017-08-05
  Filled 2017-08-05: qty 2

## 2017-08-05 MED ORDER — LORAZEPAM 2 MG/ML IJ SOLN
INTRAMUSCULAR | Status: AC
  Filled 2017-08-05: qty 1

## 2017-08-05 MED ORDER — DIPHENHYDRAMINE HCL 25 MG PO CAPS
25.0000 mg | ORAL_CAPSULE | Freq: Once | ORAL | Status: AC
  Administered 2017-08-05: 25 mg via ORAL

## 2017-08-05 MED ORDER — HEPARIN SOD (PORK) LOCK FLUSH 100 UNIT/ML IV SOLN
500.0000 [IU] | Freq: Once | INTRAVENOUS | Status: AC | PRN
  Administered 2017-08-05: 500 [IU]
  Filled 2017-08-05: qty 5

## 2017-08-05 MED ORDER — SODIUM CHLORIDE 0.9 % IJ SOLN
10.0000 mL | INTRAMUSCULAR | Status: DC | PRN
  Administered 2017-08-05: 10 mL
  Filled 2017-08-05: qty 10

## 2017-08-05 MED ORDER — ACETAMINOPHEN 325 MG PO TABS
650.0000 mg | ORAL_TABLET | Freq: Once | ORAL | Status: AC
  Administered 2017-08-05: 650 mg via ORAL

## 2017-08-05 MED ORDER — LORAZEPAM 2 MG/ML IJ SOLN: 0.5000 mg | Freq: Once | INTRAMUSCULAR | Status: DC

## 2017-08-05 MED ORDER — TRASTUZUMAB CHEMO 150 MG IV SOLR
900.0000 mg | Freq: Once | INTRAVENOUS | Status: AC
  Administered 2017-08-05: 900 mg via INTRAVENOUS
  Filled 2017-08-05: qty 42.9

## 2017-08-05 MED ORDER — DIPHENHYDRAMINE HCL 25 MG PO CAPS
ORAL_CAPSULE | ORAL | Status: AC
  Filled 2017-08-05: qty 1

## 2017-08-05 MED ORDER — OXYCODONE HCL ER 10 MG PO T12A: 10.0000 mg | EXTENDED_RELEASE_TABLET | Freq: Two times a day (BID) | ORAL | 0 refills | Status: DC

## 2017-08-05 MED FILL — OxyCONTIN 10 MG T12A: 10 | 30 days supply | Qty: 60 | Fill #0

## 2017-08-05 NOTE — Patient Instructions (Addendum)
[facility] Discharge Instructions for Patients Receiving Chemotherapy  Today you received the following chemotherapy agents Herceptin  To help prevent nausea and vomiting after your treatment, we encourage you to take your nausea medication as directed   If you develop nausea and vomiting that is not controlled by your nausea medication, call the clinic.   BELOW ARE SYMPTOMS THAT SHOULD BE REPORTED IMMEDIATELY:  *FEVER GREATER THAN 100.5 F  *CHILLS WITH OR WITHOUT FEVER  NAUSEA AND VOMITING THAT IS NOT CONTROLLED WITH YOUR NAUSEA MEDICATION  *UNUSUAL SHORTNESS OF BREATH  *UNUSUAL BRUISING OR BLEEDING  TENDERNESS IN MOUTH AND THROAT WITH OR WITHOUT PRESENCE OF ULCERS  *URINARY PROBLEMS  *BOWEL PROBLEMS  UNUSUAL RASH Items with * indicate a potential emergency and should be followed up as soon as possible.  Feel free to call the clinic should you have any questions or concerns. The clinic phone number is [phone].  Please show the CHEMO ALERT CARD at check-in to the Emergency Department and triage nurse.   
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
## 2017-08-06 ENCOUNTER — Telehealth: Payer: Self-pay | Admitting: Oncology

## 2017-08-06 LAB — CANCER ANTIGEN 27.29: CA 27.29: 18 U/mL (ref 0.0–38.6)

## 2017-08-06 NOTE — Telephone Encounter (Signed)
Left message for patient regarding upcoming January appointments.

## 2017-08-24 ENCOUNTER — Other Ambulatory Visit: Payer: Medicare Other

## 2017-08-24 ENCOUNTER — Ambulatory Visit: Payer: Medicare Other

## 2017-08-24 ENCOUNTER — Ambulatory Visit: Payer: Medicare Other | Admitting: Oncology

## 2017-08-25 ENCOUNTER — Other Ambulatory Visit: Payer: Self-pay | Admitting: Family Medicine

## 2017-08-31 DIAGNOSIS — I5020 Unspecified systolic (congestive) heart failure: Secondary | ICD-10-CM | POA: Diagnosis not present

## 2017-09-02 ENCOUNTER — Other Ambulatory Visit: Payer: Medicare Other

## 2017-09-02 ENCOUNTER — Ambulatory Visit: Payer: Medicare Other | Admitting: Oncology

## 2017-09-02 ENCOUNTER — Ambulatory Visit: Payer: Medicare Other

## 2017-09-02 ENCOUNTER — Telehealth: Payer: Self-pay | Admitting: Adult Health

## 2017-09-02 NOTE — Telephone Encounter (Signed)
Rescheduled appts per 1/15 sch msg - spoke with patient regarding appts that were added.

## 2017-09-09 ENCOUNTER — Encounter: Payer: Self-pay | Admitting: Adult Health

## 2017-09-09 ENCOUNTER — Inpatient Hospital Stay: Payer: Medicare Other | Attending: Oncology

## 2017-09-09 ENCOUNTER — Inpatient Hospital Stay: Payer: Medicare Other

## 2017-09-09 ENCOUNTER — Inpatient Hospital Stay (HOSPITAL_BASED_OUTPATIENT_CLINIC_OR_DEPARTMENT_OTHER): Payer: Medicare Other | Admitting: Adult Health

## 2017-09-09 VITALS — BP 163/70 | HR 70 | Temp 98.3°F | Resp 17 | Ht 65.0 in | Wt 317.4 lb

## 2017-09-09 DIAGNOSIS — C7800 Secondary malignant neoplasm of unspecified lung: Secondary | ICD-10-CM

## 2017-09-09 DIAGNOSIS — Z5112 Encounter for antineoplastic immunotherapy: Secondary | ICD-10-CM | POA: Insufficient documentation

## 2017-09-09 DIAGNOSIS — C50212 Malignant neoplasm of upper-inner quadrant of left female breast: Secondary | ICD-10-CM | POA: Diagnosis not present

## 2017-09-09 DIAGNOSIS — C50911 Malignant neoplasm of unspecified site of right female breast: Secondary | ICD-10-CM

## 2017-09-09 DIAGNOSIS — C7801 Secondary malignant neoplasm of right lung: Secondary | ICD-10-CM

## 2017-09-09 DIAGNOSIS — Z86718 Personal history of other venous thrombosis and embolism: Secondary | ICD-10-CM | POA: Insufficient documentation

## 2017-09-09 DIAGNOSIS — Z170 Estrogen receptor positive status [ER+]: Secondary | ICD-10-CM

## 2017-09-09 DIAGNOSIS — Z79811 Long term (current) use of aromatase inhibitors: Secondary | ICD-10-CM | POA: Insufficient documentation

## 2017-09-09 DIAGNOSIS — G8929 Other chronic pain: Secondary | ICD-10-CM | POA: Diagnosis not present

## 2017-09-09 DIAGNOSIS — C50919 Malignant neoplasm of unspecified site of unspecified female breast: Secondary | ICD-10-CM

## 2017-09-09 LAB — COMPREHENSIVE METABOLIC PANEL
ALT: 33 U/L (ref 0–55)
AST: 20 U/L (ref 5–34)
Albumin: 3.1 g/dL — ABNORMAL LOW (ref 3.5–5.0)
Alkaline Phosphatase: 116 U/L (ref 40–150)
Anion gap: 9 (ref 3–11)
BUN: 20 mg/dL (ref 7–26)
CO2: 31 mmol/L — ABNORMAL HIGH (ref 22–29)
Calcium: 9.5 mg/dL (ref 8.4–10.4)
Chloride: 101 mmol/L (ref 98–109)
Creatinine, Ser: 0.83 mg/dL (ref 0.60–1.10)
GFR calc Af Amer: >60 mL/min (ref >60)
GFR calc non Af Amer: >60 mL/min (ref >60)
Glucose, Bld: 238 mg/dL — ABNORMAL HIGH (ref 70–140)
Potassium: 4.4 mmol/L (ref 3.3–4.7)
Sodium: 141 mmol/L (ref 136–145)
Total Bilirubin: 0.3 mg/dL (ref 0.2–1.2)
Total Protein: 7.4 g/dL (ref 6.4–8.3)

## 2017-09-09 LAB — CBC WITH DIFFERENTIAL/PLATELET
Basophils Absolute: 0 10*3/uL (ref 0.0–0.1)
Basophils Relative: 0 %
Eosinophils Absolute: 0.1 10*3/uL (ref 0.0–0.5)
Eosinophils Relative: 2 %
HCT: 38.1 % (ref 34.8–46.6)
Hemoglobin: 12.5 g/dL (ref 11.6–15.9)
Lymphocytes Relative: 29 %
Lymphs Abs: 2.3 10*3/uL (ref 0.9–3.3)
MCH: 28 pg (ref 25.1–34.0)
MCHC: 32.9 g/dL (ref 31.5–36.0)
MCV: 85.3 fL (ref 79.5–101.0)
Monocytes Absolute: 0.5 10*3/uL (ref 0.1–0.9)
Monocytes Relative: 6 %
Neutro Abs: 4.9 10*3/uL (ref 1.5–6.5)
Neutrophils Relative %: 63 %
Platelets: 252 10*3/uL (ref 145–400)
RBC: 4.46 MIL/uL (ref 3.70–5.45)
RDW: 15 % (ref 11.2–16.1)
WBC: 7.8 10*3/uL (ref 3.9–10.3)

## 2017-09-09 MED ORDER — ACETAMINOPHEN 325 MG PO TABS
650.0000 mg | ORAL_TABLET | Freq: Once | ORAL | Status: AC
  Administered 2017-09-09: 650 mg via ORAL

## 2017-09-09 MED ORDER — SODIUM CHLORIDE 0.9 % IJ SOLN
10.0000 mL | INTRAMUSCULAR | Status: DC | PRN
  Administered 2017-09-09: 10 mL
  Filled 2017-09-09: qty 10

## 2017-09-09 MED ORDER — SODIUM CHLORIDE 0.9 % IV SOLN
Freq: Once | INTRAVENOUS | Status: AC
  Administered 2017-09-09: 11:00:00 via INTRAVENOUS

## 2017-09-09 MED ORDER — DIPHENHYDRAMINE HCL 25 MG PO CAPS
25.0000 mg | ORAL_CAPSULE | Freq: Once | ORAL | Status: AC
  Administered 2017-09-09: 25 mg via ORAL

## 2017-09-09 MED ORDER — TRASTUZUMAB CHEMO 150 MG IV SOLR
900.0000 mg | Freq: Once | INTRAVENOUS | Status: AC
  Administered 2017-09-09: 900 mg via INTRAVENOUS
  Filled 2017-09-09: qty 42.86

## 2017-09-09 MED ORDER — ACETAMINOPHEN 325 MG PO TABS
ORAL_TABLET | ORAL | Status: AC
  Filled 2017-09-09: qty 2

## 2017-09-09 MED ORDER — LORAZEPAM 2 MG/ML IJ SOLN
INTRAMUSCULAR | Status: AC
  Filled 2017-09-09: qty 1

## 2017-09-09 MED ORDER — DIPHENHYDRAMINE HCL 25 MG PO CAPS
ORAL_CAPSULE | ORAL | Status: AC
  Filled 2017-09-09: qty 1

## 2017-09-09 MED ORDER — METHADONE HCL 5 MG PO TABS: 5.0000 mg | ORAL_TABLET | Freq: Three times a day (TID) | ORAL | 0 refills | Status: DC

## 2017-09-09 MED ORDER — HEPARIN SOD (PORK) LOCK FLUSH 100 UNIT/ML IV SOLN
500.0000 [IU] | Freq: Once | INTRAVENOUS | Status: AC | PRN
  Administered 2017-09-09: 500 [IU]
  Filled 2017-09-09: qty 5

## 2017-09-09 MED ORDER — LORAZEPAM 2 MG/ML IJ SOLN: 0.5000 mg | Freq: Once | INTRAMUSCULAR | Status: DC

## 2017-09-09 NOTE — Patient Instructions (Signed)
[facility] Discharge Instructions for Patients Receiving Chemotherapy  Today you received the following chemotherapy agents Herceptin  To help prevent nausea and vomiting after your treatment, we encourage you to take your nausea medication as directed   If you develop nausea and vomiting that is not controlled by your nausea medication, call the clinic.   BELOW ARE SYMPTOMS THAT SHOULD BE REPORTED IMMEDIATELY:  *FEVER GREATER THAN 100.5 F  *CHILLS WITH OR WITHOUT FEVER  NAUSEA AND VOMITING THAT IS NOT CONTROLLED WITH YOUR NAUSEA MEDICATION  *UNUSUAL SHORTNESS OF BREATH  *UNUSUAL BRUISING OR BLEEDING  TENDERNESS IN MOUTH AND THROAT WITH OR WITHOUT PRESENCE OF ULCERS  *URINARY PROBLEMS  *BOWEL PROBLEMS  UNUSUAL RASH Items with * indicate a potential emergency and should be followed up as soon as possible.  Feel free to call the clinic should you have any questions or concerns. The clinic phone number is [phone].  Please show the CHEMO ALERT CARD at check-in to the Emergency Department and triage nurse.   

## 2017-09-09 NOTE — Patient Instructions (Signed)
Senokot-S 1 tablet twice a day Miralax one dose daily  If no bowel movement, 1/2 bottle of mag citrate
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
## 2017-09-10 LAB — CANCER ANTIGEN 27.29: CA 27.29: 16.7 U/mL (ref 0.0–38.6)

## 2017-09-11 ENCOUNTER — Telehealth: Payer: Self-pay | Admitting: Adult Health

## 2017-09-11 NOTE — Telephone Encounter (Signed)
Scheduled appts per 1/23 los - spoke with patient regarding appts that were added.

## 2017-09-21 ENCOUNTER — Ambulatory Visit: Payer: Medicare Other

## 2017-09-21 ENCOUNTER — Other Ambulatory Visit: Payer: Medicare Other

## 2017-09-21 ENCOUNTER — Other Ambulatory Visit: Payer: Self-pay | Admitting: Family Medicine

## 2017-09-22 ENCOUNTER — Other Ambulatory Visit: Payer: Self-pay

## 2017-09-22 ENCOUNTER — Telehealth: Payer: Self-pay

## 2017-09-22 MED ORDER — ONDANSETRON 8 MG PO TBDP: 8.0000 mg | ORAL_TABLET | Freq: Two times a day (BID) | ORAL | 0 refills | Status: DC | PRN

## 2017-09-22 MED ORDER — CIPROFLOXACIN HCL 500 MG PO TABS: 500.0000 mg | ORAL_TABLET | Freq: Two times a day (BID) | ORAL | 0 refills | Status: AC

## 2017-09-22 NOTE — Telephone Encounter (Signed)
Returned pt call regarding nausea due to her methadone she is currently taking. We sent In a script for zofran. She is having symptoms of a UTI as well and we sent in a script for cipro. Patient is aware of scripts sent to [facility]. No further questions at this time.  Cyndia Bent RN

## 2017-09-29 ENCOUNTER — Telehealth: Payer: Self-pay | Admitting: General Practice

## 2017-09-29 NOTE — Telephone Encounter (Signed)
[place] CSW Progress Note  CSW received request from RN to discuss patients request that we assist w getting help in the home paid for under her health insurance policy.  CSW called patient, left message, will discuss options w patient based on her coverage.  Also advised patient to contact her insurance company directly for information on in home personal care services.  Awaiting call back.    Edwyna Shell, LCSW Clinical Social Worker Phone:  [phone]

## 2017-09-30 ENCOUNTER — Encounter: Payer: Self-pay | Admitting: Oncology

## 2017-09-30 ENCOUNTER — Encounter: Payer: Self-pay | Admitting: General Practice

## 2017-09-30 ENCOUNTER — Telehealth: Payer: Self-pay | Admitting: General Practice

## 2017-09-30 NOTE — Progress Notes (Signed)
Received staff message from [place] regarding financial questions patient had.  Called patient to advise of what was needed for her bill she was inquiring about and also referred her to [place] regarding her Medicaid eligibility.  Patient verbalized understanding.

## 2017-09-30 NOTE — Telephone Encounter (Signed)
[place] CSW Progress Note  Referral received from RN, patient wants information on personal care in the home.  "I need somebody to come in and help me w personal needs, take a bath", help w toileting, assist w medication compliance, "I can't remember things like I used to, my mind is shot."  Feels like her memory is getting fuzzy, cannot remember things like she used to be able to.  Has a "girl who comes to try to take care of me", does not want provider she does not know coming into the home.   Reports that "new medication is making me too sleepy, cannot remember things."  "I wish I had Medicaid, I cant afford to pay for my medicine, cant afford copays." Wanted to know if her insurance could assist w paying current in home aide.  CSW advised calling Medicare provider directly; however also advised that in general Medicare only covers skilled in home services, not personal care services: CW reviewed the following options w patient:  - contact DSS to complete Medicaid application, patient has disability income however also has significant medical bills.  DSS is best equipped to determine possible eligibility - contact Cancer Care and apply for small grant, phone # provided -CSW will refer to [facility] Response program which provides in home CNA care for patients without Medicaid - while there is a waiting list for services, patient xcan get on the list and determine if her needs will expedite her request for CNA services - CSW also advised that patient should request emergency utility assistance from [place], patient states she will do so - CSW will meet w patient at next [facility] visit to complete any additional applications for financial assistance; has already received [employer] and is using for medications.  CSW will continue to follow and assist as possible.  Edwyna Shell, LCSW Clinical Social Worker Phone:  [phone]

## 2017-09-30 NOTE — Progress Notes (Signed)
[place] CSW Progress Note  CSW spoke w [facility] Department, tried to refer patient  to [employer].  Per worker, program is not taking new referrals as it is ending June 2019.  Health Department recommended referring patient for CAP services as well as [place] DSS Aging and Adult Services in home aide program. Both programs have waiting lists; however, if patient consents, can be referred to programs and wait for possible admission to services.  Will discuss options and complete applications when patient is at [facility] for next appointment.  Edwyna Shell, LCSW Clinical Social Worker Phone:  [phone]

## 2017-10-01 DIAGNOSIS — I5020 Unspecified systolic (congestive) heart failure: Secondary | ICD-10-CM | POA: Diagnosis not present

## 2017-10-07 ENCOUNTER — Inpatient Hospital Stay: Payer: Medicare Other

## 2017-10-07 ENCOUNTER — Encounter: Payer: Self-pay | Admitting: Adult Health

## 2017-10-07 ENCOUNTER — Inpatient Hospital Stay: Payer: Medicare Other | Attending: Oncology | Admitting: Adult Health

## 2017-10-07 VITALS — BP 142/63 | HR 65 | Temp 97.3°F | Resp 24 | Ht 65.0 in | Wt 322.2 lb

## 2017-10-07 DIAGNOSIS — C50212 Malignant neoplasm of upper-inner quadrant of left female breast: Secondary | ICD-10-CM | POA: Diagnosis not present

## 2017-10-07 DIAGNOSIS — Z170 Estrogen receptor positive status [ER+]: Secondary | ICD-10-CM

## 2017-10-07 DIAGNOSIS — C50919 Malignant neoplasm of unspecified site of unspecified female breast: Secondary | ICD-10-CM

## 2017-10-07 DIAGNOSIS — Z5112 Encounter for antineoplastic immunotherapy: Secondary | ICD-10-CM | POA: Insufficient documentation

## 2017-10-07 DIAGNOSIS — C7800 Secondary malignant neoplasm of unspecified lung: Secondary | ICD-10-CM

## 2017-10-07 LAB — COMPREHENSIVE METABOLIC PANEL
ALT: 37 U/L (ref 0–55)
AST: 27 U/L (ref 5–34)
Albumin: 3.1 g/dL — ABNORMAL LOW (ref 3.5–5.0)
Alkaline Phosphatase: 110 U/L (ref 40–150)
Anion gap: 9 (ref 3–11)
BUN: 19 mg/dL (ref 7–26)
CO2: 31 mmol/L — ABNORMAL HIGH (ref 22–29)
Calcium: 9.7 mg/dL (ref 8.4–10.4)
Chloride: 102 mmol/L (ref 98–109)
Creatinine, Ser: 0.76 mg/dL (ref 0.60–1.10)
GFR calc Af Amer: >60 mL/min (ref >60)
GFR calc non Af Amer: >60 mL/min (ref >60)
Glucose, Bld: 99 mg/dL (ref 70–140)
Potassium: 4.4 mmol/L (ref 3.5–5.1)
Sodium: 142 mmol/L (ref 136–145)
Total Bilirubin: 0.3 mg/dL (ref 0.2–1.2)
Total Protein: 7.4 g/dL (ref 6.4–8.3)

## 2017-10-07 LAB — CBC WITH DIFFERENTIAL/PLATELET
Basophils Absolute: 0 10*3/uL (ref 0.0–0.1)
Basophils Relative: 1 %
Eosinophils Absolute: 0.1 10*3/uL (ref 0.0–0.5)
Eosinophils Relative: 2 %
HCT: 38.4 % (ref 34.8–46.6)
Hemoglobin: 12.7 g/dL (ref 11.6–15.9)
Lymphocytes Relative: 25 %
Lymphs Abs: 1.7 10*3/uL (ref 0.9–3.3)
MCH: 27.9 pg (ref 25.1–34.0)
MCHC: 33 g/dL (ref 31.5–36.0)
MCV: 84.5 fL (ref 79.5–101.0)
Monocytes Absolute: 0.4 10*3/uL (ref 0.1–0.9)
Monocytes Relative: 7 %
Neutro Abs: 4.3 10*3/uL (ref 1.5–6.5)
Neutrophils Relative %: 65 %
Platelets: 244 10*3/uL (ref 145–400)
RBC: 4.55 MIL/uL (ref 3.70–5.45)
RDW: 14.7 % — ABNORMAL HIGH (ref 11.2–14.5)
WBC: 6.6 10*3/uL (ref 3.9–10.3)

## 2017-10-07 MED ORDER — DIPHENHYDRAMINE HCL 25 MG PO CAPS
25.0000 mg | ORAL_CAPSULE | Freq: Once | ORAL | Status: AC
  Administered 2017-10-07: 25 mg via ORAL

## 2017-10-07 MED ORDER — HEPARIN SOD (PORK) LOCK FLUSH 100 UNIT/ML IV SOLN
500.0000 [IU] | Freq: Once | INTRAVENOUS | Status: AC | PRN
  Administered 2017-10-07: 500 [IU]
  Filled 2017-10-07: qty 5

## 2017-10-07 MED ORDER — TRASTUZUMAB CHEMO 150 MG IV SOLR
900.0000 mg | Freq: Once | INTRAVENOUS | Status: AC
  Administered 2017-10-07: 900 mg via INTRAVENOUS
  Filled 2017-10-07: qty 42.86

## 2017-10-07 MED ORDER — ACETAMINOPHEN 325 MG PO TABS
650.0000 mg | ORAL_TABLET | Freq: Once | ORAL | Status: AC
  Administered 2017-10-07: 650 mg via ORAL

## 2017-10-07 MED ORDER — DIPHENHYDRAMINE HCL 25 MG PO CAPS
ORAL_CAPSULE | ORAL | Status: AC
  Filled 2017-10-07: qty 1

## 2017-10-07 MED ORDER — LORAZEPAM 2 MG/ML IJ SOLN: 0.5000 mg | Freq: Once | INTRAMUSCULAR | Status: DC

## 2017-10-07 MED ORDER — SODIUM CHLORIDE 0.9 % IV SOLN
Freq: Once | INTRAVENOUS | Status: AC
  Administered 2017-10-07: 12:00:00 via INTRAVENOUS

## 2017-10-07 MED ORDER — SODIUM CHLORIDE 0.9 % IJ SOLN
10.0000 mL | INTRAMUSCULAR | Status: DC | PRN
  Administered 2017-10-07: 10 mL
  Filled 2017-10-07: qty 10

## 2017-10-07 MED ORDER — ACETAMINOPHEN 325 MG PO TABS
ORAL_TABLET | ORAL | Status: AC
  Filled 2017-10-07: qty 2
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
## 2017-10-08 ENCOUNTER — Telehealth: Payer: Self-pay | Admitting: Adult Health

## 2017-10-08 LAB — CANCER ANTIGEN 27.29: CA 27.29: 17.9 U/mL (ref 0.0–38.6)

## 2017-10-08 NOTE — Telephone Encounter (Signed)
Per 2/20 no los °

## 2017-10-26 NOTE — Progress Notes (Signed)
This encounter was created in error - please disregard.

## 2017-10-29 DIAGNOSIS — I5020 Unspecified systolic (congestive) heart failure: Secondary | ICD-10-CM | POA: Diagnosis not present
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
## 2017-11-04 ENCOUNTER — Inpatient Hospital Stay: Payer: Medicare Other

## 2017-11-04 ENCOUNTER — Inpatient Hospital Stay: Payer: Medicare Other | Attending: Oncology | Admitting: Oncology

## 2017-11-04 ENCOUNTER — Telehealth: Payer: Self-pay | Admitting: Family Medicine

## 2017-11-04 VITALS — BP 158/69 | HR 65 | Temp 98.3°F | Resp 18 | Ht 65.0 in | Wt 321.1 lb

## 2017-11-04 DIAGNOSIS — I10 Essential (primary) hypertension: Secondary | ICD-10-CM | POA: Insufficient documentation

## 2017-11-04 DIAGNOSIS — Z87891 Personal history of nicotine dependence: Secondary | ICD-10-CM | POA: Diagnosis not present

## 2017-11-04 DIAGNOSIS — Z794 Long term (current) use of insulin: Secondary | ICD-10-CM | POA: Insufficient documentation

## 2017-11-04 DIAGNOSIS — Z803 Family history of malignant neoplasm of breast: Secondary | ICD-10-CM | POA: Diagnosis not present

## 2017-11-04 DIAGNOSIS — C50212 Malignant neoplasm of upper-inner quadrant of left female breast: Secondary | ICD-10-CM | POA: Diagnosis not present

## 2017-11-04 DIAGNOSIS — Z79899 Other long term (current) drug therapy: Secondary | ICD-10-CM | POA: Insufficient documentation

## 2017-11-04 DIAGNOSIS — C7800 Secondary malignant neoplasm of unspecified lung: Secondary | ICD-10-CM

## 2017-11-04 DIAGNOSIS — Z8041 Family history of malignant neoplasm of ovary: Secondary | ICD-10-CM | POA: Insufficient documentation

## 2017-11-04 DIAGNOSIS — Z7982 Long term (current) use of aspirin: Secondary | ICD-10-CM | POA: Diagnosis not present

## 2017-11-04 DIAGNOSIS — C50919 Malignant neoplasm of unspecified site of unspecified female breast: Secondary | ICD-10-CM

## 2017-11-04 DIAGNOSIS — F329 Major depressive disorder, single episode, unspecified: Secondary | ICD-10-CM | POA: Insufficient documentation

## 2017-11-04 DIAGNOSIS — M1990 Unspecified osteoarthritis, unspecified site: Secondary | ICD-10-CM | POA: Insufficient documentation

## 2017-11-04 DIAGNOSIS — Z9981 Dependence on supplemental oxygen: Secondary | ICD-10-CM | POA: Insufficient documentation

## 2017-11-04 DIAGNOSIS — Z170 Estrogen receptor positive status [ER+]: Secondary | ICD-10-CM

## 2017-11-04 DIAGNOSIS — Z87828 Personal history of other (healed) physical injury and trauma: Secondary | ICD-10-CM | POA: Diagnosis not present

## 2017-11-04 DIAGNOSIS — K760 Fatty (change of) liver, not elsewhere classified: Secondary | ICD-10-CM | POA: Diagnosis not present

## 2017-11-04 DIAGNOSIS — Z5112 Encounter for antineoplastic immunotherapy: Secondary | ICD-10-CM | POA: Diagnosis not present

## 2017-11-04 DIAGNOSIS — E119 Type 2 diabetes mellitus without complications: Secondary | ICD-10-CM | POA: Insufficient documentation

## 2017-11-04 DIAGNOSIS — Z79811 Long term (current) use of aromatase inhibitors: Secondary | ICD-10-CM | POA: Insufficient documentation

## 2017-11-04 LAB — CBC WITH DIFFERENTIAL/PLATELET
Basophils Absolute: 0 10*3/uL (ref 0.0–0.1)
Basophils Relative: 0 %
Eosinophils Absolute: 0.1 10*3/uL (ref 0.0–0.5)
Eosinophils Relative: 2 %
HCT: 39.9 % (ref 34.8–46.6)
Hemoglobin: 13 g/dL (ref 11.6–15.9)
Lymphocytes Relative: 26 %
Lymphs Abs: 1.7 10*3/uL (ref 0.9–3.3)
MCH: 27.6 pg (ref 25.1–34.0)
MCHC: 32.5 g/dL (ref 31.5–36.0)
MCV: 84.9 fL (ref 79.5–101.0)
Monocytes Absolute: 0.4 10*3/uL (ref 0.1–0.9)
Monocytes Relative: 7 %
Neutro Abs: 4.1 10*3/uL (ref 1.5–6.5)
Neutrophils Relative %: 65 %
Platelets: 237 10*3/uL (ref 145–400)
RBC: 4.7 MIL/uL (ref 3.70–5.45)
RDW: 14.9 % — ABNORMAL HIGH (ref 11.2–14.5)
WBC: 6.4 10*3/uL (ref 3.9–10.3)

## 2017-11-04 LAB — COMPREHENSIVE METABOLIC PANEL
ALT: 32 U/L (ref 0–55)
AST: 18 U/L (ref 5–34)
Albumin: 3 g/dL — ABNORMAL LOW (ref 3.5–5.0)
Alkaline Phosphatase: 107 U/L (ref 40–150)
Anion gap: 7 (ref 3–11)
BUN: 18 mg/dL (ref 7–26)
CO2: 32 mmol/L — ABNORMAL HIGH (ref 22–29)
Calcium: 9.7 mg/dL (ref 8.4–10.4)
Chloride: 104 mmol/L (ref 98–109)
Creatinine, Ser: 0.88 mg/dL (ref 0.60–1.10)
GFR calc Af Amer: >60 mL/min (ref >60)
GFR calc non Af Amer: >60 mL/min (ref >60)
Glucose, Bld: 177 mg/dL — ABNORMAL HIGH (ref 70–140)
Potassium: 4.2 mmol/L (ref 3.5–5.1)
Sodium: 143 mmol/L (ref 136–145)
Total Bilirubin: 0.2 mg/dL (ref 0.2–1.2)
Total Protein: 7.2 g/dL (ref 6.4–8.3)

## 2017-11-04 MED ORDER — HEPARIN SOD (PORK) LOCK FLUSH 100 UNIT/ML IV SOLN
500.0000 [IU] | Freq: Once | INTRAVENOUS | Status: AC | PRN
  Administered 2017-11-04: 500 [IU]
  Filled 2017-11-04: qty 5

## 2017-11-04 MED ORDER — LORAZEPAM 2 MG/ML IJ SOLN: 0.5000 mg | Freq: Once | INTRAMUSCULAR | Status: DC

## 2017-11-04 MED ORDER — TRASTUZUMAB CHEMO 150 MG IV SOLR
900.0000 mg | Freq: Once | INTRAVENOUS | Status: AC
  Administered 2017-11-04: 900 mg via INTRAVENOUS
  Filled 2017-11-04: qty 42.86

## 2017-11-04 MED ORDER — DIPHENHYDRAMINE HCL 25 MG PO CAPS
ORAL_CAPSULE | ORAL | Status: AC
  Filled 2017-11-04: qty 1

## 2017-11-04 MED ORDER — ACETAMINOPHEN 325 MG PO TABS
650.0000 mg | ORAL_TABLET | Freq: Once | ORAL | Status: AC
  Administered 2017-11-04: 650 mg via ORAL

## 2017-11-04 MED ORDER — DIPHENHYDRAMINE HCL 25 MG PO CAPS
25.0000 mg | ORAL_CAPSULE | Freq: Once | ORAL | Status: AC
  Administered 2017-11-04: 25 mg via ORAL

## 2017-11-04 MED ORDER — ACETAMINOPHEN 325 MG PO TABS
ORAL_TABLET | ORAL | Status: AC
  Filled 2017-11-04: qty 2

## 2017-11-04 MED ORDER — SODIUM CHLORIDE 0.9 % IJ SOLN
10.0000 mL | INTRAMUSCULAR | Status: DC | PRN
  Administered 2017-11-04: 10 mL
  Filled 2017-11-04: qty 10

## 2017-11-04 MED ORDER — SODIUM CHLORIDE 0.9 % IV SOLN
Freq: Once | INTRAVENOUS | Status: AC
  Administered 2017-11-04: 11:00:00 via INTRAVENOUS

## 2017-11-04 NOTE — Telephone Encounter (Signed)
Pt is going through chemo and has prioritized seeing the oncologist and referrals he suggests at the top of her health list. She mentioned that the cancer she has in incurable so health reminders are not big on her radar.  Pt has limited funding and between taking care of her grandchildren and herself and paying  doctor office's copay's and transportation, she has little money left to pay for her visit here.  She mentioned that she can't check her sugar but can tell when its too high and too low. I told he she has to come in for you to examine her and prescribe her a meter because you cannot create a prescription if you haven't seen a pt within certain time period.  She said she is trying really hard to keep up with her bills and said that she will make an appt in April or May.

## 2017-11-04 NOTE — Progress Notes (Signed)
Okay to treat with echo from 07/02/17 per Val, RN. Echo ordered every 6 months.

## 2017-11-04 NOTE — Patient Instructions (Signed)
[facility] Discharge Instructions for Patients Receiving Chemotherapy  Today you received the following chemotherapy agents Herceptin  To help prevent nausea and vomiting after your treatment, we encourage you to take your nausea medication as directed   If you develop nausea and vomiting that is not controlled by your nausea medication, call the clinic.   BELOW ARE SYMPTOMS THAT SHOULD BE REPORTED IMMEDIATELY:  *FEVER GREATER THAN 100.5 F  *CHILLS WITH OR WITHOUT FEVER  NAUSEA AND VOMITING THAT IS NOT CONTROLLED WITH YOUR NAUSEA MEDICATION  *UNUSUAL SHORTNESS OF BREATH  *UNUSUAL BRUISING OR BLEEDING  TENDERNESS IN MOUTH AND THROAT WITH OR WITHOUT PRESENCE OF ULCERS  *URINARY PROBLEMS  *BOWEL PROBLEMS  UNUSUAL RASH Items with * indicate a potential emergency and should be followed up as soon as possible.  Feel free to call the clinic should you have any questions or concerns. The clinic phone number is [phone].  Please show the CHEMO ALERT CARD at check-in to the Emergency Department and triage nurse.   

## 2017-11-05 LAB — CANCER ANTIGEN 27.29: CA 27.29: 18.7 U/mL (ref 0.0–38.6)

## 2017-11-21 ENCOUNTER — Other Ambulatory Visit: Payer: Self-pay | Admitting: Family Medicine

## 2017-11-29 DIAGNOSIS — I5020 Unspecified systolic (congestive) heart failure: Secondary | ICD-10-CM | POA: Diagnosis not present

## 2017-12-02 ENCOUNTER — Inpatient Hospital Stay: Payer: Medicare Other

## 2017-12-02 ENCOUNTER — Inpatient Hospital Stay: Payer: Medicare Other | Attending: Oncology

## 2017-12-02 VITALS — BP 177/62 | HR 55 | Temp 97.8°F | Resp 18 | Wt 323.5 lb

## 2017-12-02 DIAGNOSIS — C50919 Malignant neoplasm of unspecified site of unspecified female breast: Secondary | ICD-10-CM

## 2017-12-02 DIAGNOSIS — Z170 Estrogen receptor positive status [ER+]: Secondary | ICD-10-CM | POA: Insufficient documentation

## 2017-12-02 DIAGNOSIS — C7800 Secondary malignant neoplasm of unspecified lung: Secondary | ICD-10-CM | POA: Diagnosis not present

## 2017-12-02 DIAGNOSIS — Z5112 Encounter for antineoplastic immunotherapy: Secondary | ICD-10-CM | POA: Diagnosis not present

## 2017-12-02 DIAGNOSIS — Z79811 Long term (current) use of aromatase inhibitors: Secondary | ICD-10-CM | POA: Insufficient documentation

## 2017-12-02 DIAGNOSIS — C50212 Malignant neoplasm of upper-inner quadrant of left female breast: Secondary | ICD-10-CM

## 2017-12-02 LAB — CBC WITH DIFFERENTIAL/PLATELET
Basophils Absolute: 0 10*3/uL (ref 0.0–0.1)
Basophils Relative: 0 %
Eosinophils Absolute: 0.1 10*3/uL (ref 0.0–0.5)
Eosinophils Relative: 2 %
HCT: 43.2 % (ref 34.8–46.6)
Hemoglobin: 13.9 g/dL (ref 11.6–15.9)
Lymphocytes Relative: 35 %
Lymphs Abs: 2.1 10*3/uL (ref 0.9–3.3)
MCH: 28 pg (ref 25.1–34.0)
MCHC: 32.2 g/dL (ref 31.5–36.0)
MCV: 87.1 fL (ref 79.5–101.0)
Monocytes Absolute: 0.3 10*3/uL (ref 0.1–0.9)
Monocytes Relative: 5 %
Neutro Abs: 3.6 10*3/uL (ref 1.5–6.5)
Neutrophils Relative %: 58 %
Platelets: 244 10*3/uL (ref 145–400)
RBC: 4.96 MIL/uL (ref 3.70–5.45)
RDW: 14.3 % (ref 11.2–14.5)
WBC: 6.2 10*3/uL (ref 3.9–10.3)

## 2017-12-02 LAB — COMPREHENSIVE METABOLIC PANEL
ALT: 45 U/L (ref 0–55)
AST: 28 U/L (ref 5–34)
Albumin: 3.1 g/dL — ABNORMAL LOW (ref 3.5–5.0)
Alkaline Phosphatase: 117 U/L (ref 40–150)
Anion gap: 7 (ref 3–11)
BUN: 15 mg/dL (ref 7–26)
CO2: 33 mmol/L — ABNORMAL HIGH (ref 22–29)
Calcium: 9.8 mg/dL (ref 8.4–10.4)
Chloride: 100 mmol/L (ref 98–109)
Creatinine, Ser: 0.82 mg/dL (ref 0.60–1.10)
GFR calc Af Amer: >60 mL/min (ref >60)
GFR calc non Af Amer: >60 mL/min (ref >60)
Glucose, Bld: 162 mg/dL — ABNORMAL HIGH (ref 70–140)
Potassium: 4.3 mmol/L (ref 3.5–5.1)
Sodium: 140 mmol/L (ref 136–145)
Total Bilirubin: 0.2 mg/dL (ref 0.2–1.2)
Total Protein: 7.6 g/dL (ref 6.4–8.3)

## 2017-12-02 MED ORDER — DIPHENHYDRAMINE HCL 25 MG PO CAPS
25.0000 mg | ORAL_CAPSULE | Freq: Once | ORAL | Status: AC
  Administered 2017-12-02: 25 mg via ORAL

## 2017-12-02 MED ORDER — SODIUM CHLORIDE 0.9 % IJ SOLN
10.0000 mL | INTRAMUSCULAR | Status: DC | PRN
  Administered 2017-12-02: 10 mL
  Filled 2017-12-02: qty 10

## 2017-12-02 MED ORDER — LORAZEPAM 2 MG/ML IJ SOLN
0.5000 mg | Freq: Once | INTRAMUSCULAR | Status: DC
Start: 2017-12-02 — End: 2017-12-02

## 2017-12-02 MED ORDER — HEPARIN SOD (PORK) LOCK FLUSH 100 UNIT/ML IV SOLN
500.0000 [IU] | Freq: Once | INTRAVENOUS | Status: AC | PRN
  Administered 2017-12-02: 500 [IU]
  Filled 2017-12-02: qty 5

## 2017-12-02 MED ORDER — SODIUM CHLORIDE 0.9 % IV SOLN
Freq: Once | INTRAVENOUS | Status: AC
  Administered 2017-12-02: 10:00:00 via INTRAVENOUS

## 2017-12-02 MED ORDER — SODIUM CHLORIDE 0.9 % IV SOLN
900.0000 mg | Freq: Once | INTRAVENOUS | Status: AC
  Administered 2017-12-02: 900 mg via INTRAVENOUS
  Filled 2017-12-02: qty 42.86

## 2017-12-02 MED ORDER — ACETAMINOPHEN 325 MG PO TABS
650.0000 mg | ORAL_TABLET | Freq: Once | ORAL | Status: AC
  Administered 2017-12-02: 650 mg via ORAL

## 2017-12-02 MED ORDER — ACETAMINOPHEN 325 MG PO TABS
ORAL_TABLET | ORAL | Status: AC
Start: 2017-12-02 — End: 2017-12-02
  Filled 2017-12-02: qty 2

## 2017-12-02 MED ORDER — DIPHENHYDRAMINE HCL 25 MG PO CAPS
ORAL_CAPSULE | ORAL | Status: AC
  Filled 2017-12-02: qty 1

## 2017-12-02 NOTE — Patient Instructions (Signed)
[facility] Discharge Instructions for Patients Receiving Chemotherapy  Today you received the following chemotherapy agents Herceptin  To help prevent nausea and vomiting after your treatment, we encourage you to take your nausea medication as directed   If you develop nausea and vomiting that is not controlled by your nausea medication, call the clinic.   BELOW ARE SYMPTOMS THAT SHOULD BE REPORTED IMMEDIATELY:  *FEVER GREATER THAN 100.5 F  *CHILLS WITH OR WITHOUT FEVER  NAUSEA AND VOMITING THAT IS NOT CONTROLLED WITH YOUR NAUSEA MEDICATION  *UNUSUAL SHORTNESS OF BREATH  *UNUSUAL BRUISING OR BLEEDING  TENDERNESS IN MOUTH AND THROAT WITH OR WITHOUT PRESENCE OF ULCERS  *URINARY PROBLEMS  *BOWEL PROBLEMS  UNUSUAL RASH Items with * indicate a potential emergency and should be followed up as soon as possible.  Feel free to call the clinic should you have any questions or concerns. The clinic phone number is [phone].  Please show the CHEMO ALERT CARD at check-in to the Emergency Department and triage nurse.   

## 2017-12-03 LAB — CANCER ANTIGEN 27.29: CA 27.29: 17.5 U/mL (ref 0.0–38.6)

## 2017-12-21 ENCOUNTER — Other Ambulatory Visit: Payer: Self-pay | Admitting: Oncology

## 2017-12-21 DIAGNOSIS — C50212 Malignant neoplasm of upper-inner quadrant of left female breast: Secondary | ICD-10-CM

## 2017-12-21 DIAGNOSIS — Z170 Estrogen receptor positive status [ER+]: Secondary | ICD-10-CM

## 2017-12-24 ENCOUNTER — Other Ambulatory Visit (HOSPITAL_COMMUNITY): Payer: Medicare Other

## 2017-12-29 ENCOUNTER — Other Ambulatory Visit (HOSPITAL_COMMUNITY): Payer: Medicare Other

## 2017-12-29 DIAGNOSIS — I5020 Unspecified systolic (congestive) heart failure: Secondary | ICD-10-CM | POA: Diagnosis not present

## 2017-12-30 ENCOUNTER — Inpatient Hospital Stay: Payer: Medicare Other

## 2017-12-30 ENCOUNTER — Inpatient Hospital Stay: Payer: Medicare Other | Attending: Oncology

## 2017-12-30 VITALS — BP 180/74 | HR 68 | Temp 97.7°F | Resp 18

## 2017-12-30 DIAGNOSIS — C7800 Secondary malignant neoplasm of unspecified lung: Secondary | ICD-10-CM

## 2017-12-30 DIAGNOSIS — Z95828 Presence of other vascular implants and grafts: Secondary | ICD-10-CM

## 2017-12-30 DIAGNOSIS — Z170 Estrogen receptor positive status [ER+]: Secondary | ICD-10-CM | POA: Diagnosis not present

## 2017-12-30 DIAGNOSIS — Z5111 Encounter for antineoplastic chemotherapy: Secondary | ICD-10-CM | POA: Insufficient documentation

## 2017-12-30 DIAGNOSIS — C50212 Malignant neoplasm of upper-inner quadrant of left female breast: Secondary | ICD-10-CM | POA: Diagnosis not present

## 2017-12-30 DIAGNOSIS — C50919 Malignant neoplasm of unspecified site of unspecified female breast: Secondary | ICD-10-CM

## 2017-12-30 LAB — COMPREHENSIVE METABOLIC PANEL
ALT: 48 U/L (ref 0–55)
AST: 32 U/L (ref 5–34)
Albumin: 3.2 g/dL — ABNORMAL LOW (ref 3.5–5.0)
Alkaline Phosphatase: 111 U/L (ref 40–150)
Anion gap: 6 (ref 3–11)
BUN: 17 mg/dL (ref 7–26)
CO2: 33 mmol/L — ABNORMAL HIGH (ref 22–29)
Calcium: 9.7 mg/dL (ref 8.4–10.4)
Chloride: 101 mmol/L (ref 98–109)
Creatinine, Ser: 0.81 mg/dL (ref 0.60–1.10)
GFR calc Af Amer: >60 mL/min (ref >60)
GFR calc non Af Amer: >60 mL/min (ref >60)
Glucose, Bld: 186 mg/dL — ABNORMAL HIGH (ref 70–140)
Potassium: 4.2 mmol/L (ref 3.5–5.1)
Sodium: 140 mmol/L (ref 136–145)
Total Bilirubin: 0.3 mg/dL (ref 0.2–1.2)
Total Protein: 7.4 g/dL (ref 6.4–8.3)

## 2017-12-30 LAB — CBC WITH DIFFERENTIAL/PLATELET
Basophils Absolute: 0 10*3/uL (ref 0.0–0.1)
Basophils Relative: 0 %
Eosinophils Absolute: 0.1 10*3/uL (ref 0.0–0.5)
Eosinophils Relative: 2 %
HCT: 41 % (ref 34.8–46.6)
Hemoglobin: 13.5 g/dL (ref 11.6–15.9)
Lymphocytes Relative: 28 %
Lymphs Abs: 1.9 10*3/uL (ref 0.9–3.3)
MCH: 27.6 pg (ref 25.1–34.0)
MCHC: 32.8 g/dL (ref 31.5–36.0)
MCV: 84 fL (ref 79.5–101.0)
Monocytes Absolute: 0.5 10*3/uL (ref 0.1–0.9)
Monocytes Relative: 7 %
Neutro Abs: 4.4 10*3/uL (ref 1.5–6.5)
Neutrophils Relative %: 63 %
Platelets: 230 10*3/uL (ref 145–400)
RBC: 4.89 MIL/uL (ref 3.70–5.45)
RDW: 14.8 % — ABNORMAL HIGH (ref 11.2–14.5)
WBC: 7 10*3/uL (ref 3.9–10.3)

## 2017-12-30 MED ORDER — LORAZEPAM 2 MG/ML IJ SOLN: 0.5000 mg | Freq: Once | INTRAMUSCULAR | Status: DC

## 2017-12-30 MED ORDER — DIPHENHYDRAMINE HCL 25 MG PO CAPS
25.0000 mg | ORAL_CAPSULE | Freq: Once | ORAL | Status: AC
  Administered 2017-12-30: 25 mg via ORAL

## 2017-12-30 MED ORDER — HEPARIN SOD (PORK) LOCK FLUSH 100 UNIT/ML IV SOLN
500.0000 [IU] | Freq: Once | INTRAVENOUS | Status: AC | PRN
  Administered 2017-12-30: 500 [IU]
  Filled 2017-12-30: qty 5

## 2017-12-30 MED ORDER — SODIUM CHLORIDE 0.9 % IV SOLN
Freq: Once | INTRAVENOUS | Status: AC
  Administered 2017-12-30: 11:00:00 via INTRAVENOUS

## 2017-12-30 MED ORDER — SODIUM CHLORIDE 0.9% FLUSH
10.0000 mL | INTRAVENOUS | Status: DC | PRN
  Administered 2017-12-30: 10 mL via INTRAVENOUS
  Filled 2017-12-30: qty 10

## 2017-12-30 MED ORDER — SODIUM CHLORIDE 0.9 % IJ SOLN
10.0000 mL | INTRAMUSCULAR | Status: DC | PRN
  Administered 2017-12-30: 10 mL
  Filled 2017-12-30: qty 10

## 2017-12-30 MED ORDER — ACETAMINOPHEN 325 MG PO TABS
ORAL_TABLET | ORAL | Status: AC
  Filled 2017-12-30: qty 2

## 2017-12-30 MED ORDER — DIPHENHYDRAMINE HCL 25 MG PO CAPS
ORAL_CAPSULE | ORAL | Status: AC
  Filled 2017-12-30: qty 1

## 2017-12-30 MED ORDER — TRASTUZUMAB CHEMO 150 MG IV SOLR
900.0000 mg | Freq: Once | INTRAVENOUS | Status: AC
  Administered 2017-12-30: 900 mg via INTRAVENOUS
  Filled 2017-12-30: qty 42.86

## 2017-12-30 MED ORDER — ACETAMINOPHEN 325 MG PO TABS
650.0000 mg | ORAL_TABLET | Freq: Once | ORAL | Status: AC
  Administered 2017-12-30: 650 mg via ORAL

## 2017-12-30 NOTE — Patient Instructions (Signed)
[facility] Discharge Instructions for Patients Receiving Chemotherapy  Today you received the following chemotherapy agents Herceptin  To help prevent nausea and vomiting after your treatment, we encourage you to take your nausea medication as directed   If you develop nausea and vomiting that is not controlled by your nausea medication, call the clinic.   BELOW ARE SYMPTOMS THAT SHOULD BE REPORTED IMMEDIATELY:  *FEVER GREATER THAN 100.5 F  *CHILLS WITH OR WITHOUT FEVER  NAUSEA AND VOMITING THAT IS NOT CONTROLLED WITH YOUR NAUSEA MEDICATION  *UNUSUAL SHORTNESS OF BREATH  *UNUSUAL BRUISING OR BLEEDING  TENDERNESS IN MOUTH AND THROAT WITH OR WITHOUT PRESENCE OF ULCERS  *URINARY PROBLEMS  *BOWEL PROBLEMS  UNUSUAL RASH Items with * indicate a potential emergency and should be followed up as soon as possible.  Feel free to call the clinic should you have any questions or concerns. The clinic phone number is [phone].  Please show the CHEMO ALERT CARD at check-in to the Emergency Department and triage nurse.   

## 2017-12-31 ENCOUNTER — Other Ambulatory Visit: Payer: Self-pay | Admitting: Family Medicine

## 2017-12-31 LAB — CANCER ANTIGEN 27.29: CA 27.29: 20.5 U/mL (ref 0.0–38.6)

## 2018-01-01 ENCOUNTER — Other Ambulatory Visit (HOSPITAL_COMMUNITY): Payer: Medicare Other

## 2018-01-07 ENCOUNTER — Ambulatory Visit (HOSPITAL_COMMUNITY)
Admission: RE | Admit: 2018-01-07 | Discharge: 2018-01-07 | Disposition: A | Discharge status: Home / Self Care | Payer: Medicare Other | Source: Ambulatory Visit | Attending: Oncology | Admitting: Oncology

## 2018-01-07 DIAGNOSIS — Z170 Estrogen receptor positive status [ER+]: Secondary | ICD-10-CM

## 2018-01-07 DIAGNOSIS — Z87891 Personal history of nicotine dependence: Secondary | ICD-10-CM | POA: Diagnosis not present

## 2018-01-07 DIAGNOSIS — C50212 Malignant neoplasm of upper-inner quadrant of left female breast: Secondary | ICD-10-CM | POA: Diagnosis not present

## 2018-01-07 DIAGNOSIS — E119 Type 2 diabetes mellitus without complications: Secondary | ICD-10-CM | POA: Diagnosis not present

## 2018-01-07 DIAGNOSIS — I119 Hypertensive heart disease without heart failure: Secondary | ICD-10-CM | POA: Insufficient documentation

## 2018-01-07 NOTE — Progress Notes (Signed)
  Echocardiogram 2D Echocardiogram has been performed.  Bobbye Charleston 01/07/2018, 12:04 PM

## 2018-01-23 ENCOUNTER — Other Ambulatory Visit: Payer: Self-pay | Admitting: Family Medicine

## 2018-01-27 ENCOUNTER — Inpatient Hospital Stay: Payer: Medicare Other | Attending: Oncology

## 2018-01-27 ENCOUNTER — Encounter: Payer: Self-pay | Admitting: Adult Health

## 2018-01-27 ENCOUNTER — Telehealth: Payer: Self-pay | Admitting: Adult Health

## 2018-01-27 ENCOUNTER — Inpatient Hospital Stay: Payer: Medicare Other

## 2018-01-27 ENCOUNTER — Inpatient Hospital Stay (HOSPITAL_BASED_OUTPATIENT_CLINIC_OR_DEPARTMENT_OTHER): Payer: Medicare Other | Admitting: Adult Health

## 2018-01-27 VITALS — BP 180/80 | HR 71 | Temp 98.0°F | Resp 18 | Ht 65.0 in | Wt 327.1 lb

## 2018-01-27 DIAGNOSIS — Z79811 Long term (current) use of aromatase inhibitors: Secondary | ICD-10-CM | POA: Diagnosis not present

## 2018-01-27 DIAGNOSIS — C50212 Malignant neoplasm of upper-inner quadrant of left female breast: Secondary | ICD-10-CM

## 2018-01-27 DIAGNOSIS — Z7901 Long term (current) use of anticoagulants: Secondary | ICD-10-CM | POA: Insufficient documentation

## 2018-01-27 DIAGNOSIS — Z170 Estrogen receptor positive status [ER+]: Secondary | ICD-10-CM

## 2018-01-27 DIAGNOSIS — M1990 Unspecified osteoarthritis, unspecified site: Secondary | ICD-10-CM | POA: Diagnosis not present

## 2018-01-27 DIAGNOSIS — Z86718 Personal history of other venous thrombosis and embolism: Secondary | ICD-10-CM | POA: Insufficient documentation

## 2018-01-27 DIAGNOSIS — M549 Dorsalgia, unspecified: Secondary | ICD-10-CM | POA: Insufficient documentation

## 2018-01-27 DIAGNOSIS — Z7982 Long term (current) use of aspirin: Secondary | ICD-10-CM | POA: Insufficient documentation

## 2018-01-27 DIAGNOSIS — Z803 Family history of malignant neoplasm of breast: Secondary | ICD-10-CM | POA: Insufficient documentation

## 2018-01-27 DIAGNOSIS — E669 Obesity, unspecified: Secondary | ICD-10-CM

## 2018-01-27 DIAGNOSIS — E119 Type 2 diabetes mellitus without complications: Secondary | ICD-10-CM | POA: Insufficient documentation

## 2018-01-27 DIAGNOSIS — Z794 Long term (current) use of insulin: Secondary | ICD-10-CM | POA: Insufficient documentation

## 2018-01-27 DIAGNOSIS — C50919 Malignant neoplasm of unspecified site of unspecified female breast: Secondary | ICD-10-CM

## 2018-01-27 DIAGNOSIS — C7800 Secondary malignant neoplasm of unspecified lung: Secondary | ICD-10-CM | POA: Diagnosis not present

## 2018-01-27 DIAGNOSIS — I10 Essential (primary) hypertension: Secondary | ICD-10-CM | POA: Diagnosis not present

## 2018-01-27 DIAGNOSIS — Z87891 Personal history of nicotine dependence: Secondary | ICD-10-CM

## 2018-01-27 DIAGNOSIS — Z5112 Encounter for antineoplastic immunotherapy: Secondary | ICD-10-CM | POA: Insufficient documentation

## 2018-01-27 DIAGNOSIS — Z79899 Other long term (current) drug therapy: Secondary | ICD-10-CM | POA: Insufficient documentation

## 2018-01-27 DIAGNOSIS — Z8041 Family history of malignant neoplasm of ovary: Secondary | ICD-10-CM | POA: Diagnosis not present

## 2018-01-27 LAB — CBC WITH DIFFERENTIAL/PLATELET
Basophils Absolute: 0 10*3/uL (ref 0.0–0.1)
Basophils Relative: 1 %
Eosinophils Absolute: 0.1 10*3/uL (ref 0.0–0.5)
Eosinophils Relative: 2 %
HCT: 42.4 % (ref 34.8–46.6)
Hemoglobin: 13.8 g/dL (ref 11.6–15.9)
Lymphocytes Relative: 27 %
Lymphs Abs: 1.9 10*3/uL (ref 0.9–3.3)
MCH: 27.4 pg (ref 25.1–34.0)
MCHC: 32.6 g/dL (ref 31.5–36.0)
MCV: 84.2 fL (ref 79.5–101.0)
Monocytes Absolute: 0.4 10*3/uL (ref 0.1–0.9)
Monocytes Relative: 6 %
Neutro Abs: 4.5 10*3/uL (ref 1.5–6.5)
Neutrophils Relative %: 64 %
Platelets: 228 10*3/uL (ref 145–400)
RBC: 5.03 MIL/uL (ref 3.70–5.45)
RDW: 15.2 % — ABNORMAL HIGH (ref 11.2–14.5)
WBC: 6.9 10*3/uL (ref 3.9–10.3)

## 2018-01-27 LAB — COMPREHENSIVE METABOLIC PANEL
ALT: 43 U/L (ref 0–55)
AST: 28 U/L (ref 5–34)
Albumin: 3.2 g/dL — ABNORMAL LOW (ref 3.5–5.0)
Alkaline Phosphatase: 114 U/L (ref 40–150)
Anion gap: 9 (ref 3–11)
BUN: 17 mg/dL (ref 7–26)
CO2: 31 mmol/L — ABNORMAL HIGH (ref 22–29)
Calcium: 9.8 mg/dL (ref 8.4–10.4)
Chloride: 99 mmol/L (ref 98–109)
Creatinine, Ser: 0.84 mg/dL (ref 0.60–1.10)
GFR calc Af Amer: >60 mL/min (ref >60)
GFR calc non Af Amer: >60 mL/min (ref >60)
Glucose, Bld: 307 mg/dL — ABNORMAL HIGH (ref 70–140)
Potassium: 4.5 mmol/L (ref 3.5–5.1)
Sodium: 139 mmol/L (ref 136–145)
Total Bilirubin: 0.3 mg/dL (ref 0.2–1.2)
Total Protein: 7.5 g/dL (ref 6.4–8.3)

## 2018-01-27 MED ORDER — DIPHENHYDRAMINE HCL 25 MG PO CAPS
ORAL_CAPSULE | ORAL | Status: AC
  Filled 2018-01-27: qty 1

## 2018-01-27 MED ORDER — DIPHENHYDRAMINE HCL 25 MG PO CAPS
25.0000 mg | ORAL_CAPSULE | Freq: Once | ORAL | Status: AC
  Administered 2018-01-27: 25 mg via ORAL

## 2018-01-27 MED ORDER — SODIUM CHLORIDE 0.9 % IJ SOLN
10.0000 mL | INTRAMUSCULAR | Status: DC | PRN
  Administered 2018-01-27: 10 mL
  Filled 2018-01-27: qty 10

## 2018-01-27 MED ORDER — LORAZEPAM 2 MG/ML IJ SOLN
0.5000 mg | Freq: Once | INTRAMUSCULAR | Status: AC
  Administered 2018-01-27: 0.5 mg via INTRAVENOUS

## 2018-01-27 MED ORDER — ACETAMINOPHEN 325 MG PO TABS
650.0000 mg | ORAL_TABLET | Freq: Once | ORAL | Status: AC
  Administered 2018-01-27: 650 mg via ORAL

## 2018-01-27 MED ORDER — SODIUM CHLORIDE 0.9 % IV SOLN
Freq: Once | INTRAVENOUS | Status: AC
  Administered 2018-01-27: 11:00:00 via INTRAVENOUS

## 2018-01-27 MED ORDER — HEPARIN SOD (PORK) LOCK FLUSH 100 UNIT/ML IV SOLN
500.0000 [IU] | Freq: Once | INTRAVENOUS | Status: AC | PRN
  Administered 2018-01-27: 500 [IU]
  Filled 2018-01-27: qty 5

## 2018-01-27 MED ORDER — ACETAMINOPHEN 325 MG PO TABS
ORAL_TABLET | ORAL | Status: AC
  Filled 2018-01-27: qty 2

## 2018-01-27 MED ORDER — LORAZEPAM 2 MG/ML IJ SOLN
INTRAMUSCULAR | Status: AC
  Filled 2018-01-27: qty 1

## 2018-01-27 MED ORDER — TRASTUZUMAB CHEMO 150 MG IV SOLR
900.0000 mg | Freq: Once | INTRAVENOUS | Status: AC
  Administered 2018-01-27: 900 mg via INTRAVENOUS
  Filled 2018-01-27: qty 42.86

## 2018-01-27 NOTE — Patient Instructions (Signed)
[facility] Discharge Instructions for Patients Receiving Chemotherapy  Today you received the following chemotherapy agents Herceptin To help prevent nausea and vomiting after your treatment, we encourage you to take your nausea medication s directed  If you develop nausea and vomiting that is not controlled by your nausea medication, call the clinic.   BELOW ARE SYMPTOMS THAT SHOULD BE REPORTED IMMEDIATELY:  *FEVER GREATER THAN 100.5 F  *CHILLS WITH OR WITHOUT FEVER  NAUSEA AND VOMITING THAT IS NOT CONTROLLED WITH YOUR NAUSEA MEDICATION  *UNUSUAL SHORTNESS OF BREATH  *UNUSUAL BRUISING OR BLEEDING  TENDERNESS IN MOUTH AND THROAT WITH OR WITHOUT PRESENCE OF ULCERS  *URINARY PROBLEMS  *BOWEL PROBLEMS  UNUSUAL RASH Items with * indicate a potential emergency and should be followed up as soon as possible.  Feel free to call the clinic should you have any questions or concerns. The clinic phone number is [phone].  Please show the [place] Line at check-in to the Emergency Department and triage nurse.

## 2018-01-27 NOTE — Progress Notes (Signed)
Pulled out ativan IV 2mg /ml,wasted with another RN accidentally put the amount of waste, did not use the ativan returned it to pharmacy, witnessed pharmacist reloaded it back in the pyxis.
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
## 2018-01-27 NOTE — Telephone Encounter (Signed)
Gave patient avs and calendar of upcoming October appointments. Patient requested appointments to be on 10/1 instead of 10/2 per her request.

## 2018-01-28 ENCOUNTER — Telehealth: Payer: Self-pay

## 2018-01-28 LAB — CANCER ANTIGEN 27.29: CA 27.29: 19.3 U/mL (ref 0.0–38.6)

## 2018-01-28 NOTE — Telephone Encounter (Signed)
LVM for patient to return call to nurse regarding pain management discussed with NP at appt.

## 2018-01-29 DIAGNOSIS — I5020 Unspecified systolic (congestive) heart failure: Secondary | ICD-10-CM | POA: Diagnosis not present

## 2018-02-01 ENCOUNTER — Other Ambulatory Visit: Payer: Self-pay | Admitting: Adult Health

## 2018-02-01 DIAGNOSIS — M4710 Other spondylosis with myelopathy, site unspecified: Secondary | ICD-10-CM

## 2018-02-01 DIAGNOSIS — G8929 Other chronic pain: Secondary | ICD-10-CM

## 2018-02-01 DIAGNOSIS — M545 Low back pain: Principal | ICD-10-CM

## 2018-02-24 ENCOUNTER — Inpatient Hospital Stay: Payer: Medicare Other | Attending: Oncology

## 2018-02-24 ENCOUNTER — Inpatient Hospital Stay (HOSPITAL_BASED_OUTPATIENT_CLINIC_OR_DEPARTMENT_OTHER): Payer: Medicare Other | Admitting: Medical

## 2018-02-24 ENCOUNTER — Inpatient Hospital Stay: Payer: Medicare Other

## 2018-02-24 VITALS — BP 152/69 | HR 61 | Temp 97.8°F | Resp 19

## 2018-02-24 DIAGNOSIS — R42 Dizziness and giddiness: Secondary | ICD-10-CM

## 2018-02-24 DIAGNOSIS — M1990 Unspecified osteoarthritis, unspecified site: Secondary | ICD-10-CM | POA: Insufficient documentation

## 2018-02-24 DIAGNOSIS — E1149 Type 2 diabetes mellitus with other diabetic neurological complication: Secondary | ICD-10-CM | POA: Diagnosis not present

## 2018-02-24 DIAGNOSIS — I10 Essential (primary) hypertension: Secondary | ICD-10-CM | POA: Diagnosis not present

## 2018-02-24 DIAGNOSIS — Z8041 Family history of malignant neoplasm of ovary: Secondary | ICD-10-CM

## 2018-02-24 DIAGNOSIS — Z803 Family history of malignant neoplasm of breast: Secondary | ICD-10-CM

## 2018-02-24 DIAGNOSIS — C50212 Malignant neoplasm of upper-inner quadrant of left female breast: Secondary | ICD-10-CM

## 2018-02-24 DIAGNOSIS — Z79899 Other long term (current) drug therapy: Secondary | ICD-10-CM

## 2018-02-24 DIAGNOSIS — Z87891 Personal history of nicotine dependence: Secondary | ICD-10-CM

## 2018-02-24 DIAGNOSIS — Z170 Estrogen receptor positive status [ER+]: Secondary | ICD-10-CM | POA: Insufficient documentation

## 2018-02-24 DIAGNOSIS — C50919 Malignant neoplasm of unspecified site of unspecified female breast: Secondary | ICD-10-CM

## 2018-02-24 DIAGNOSIS — Z5112 Encounter for antineoplastic immunotherapy: Secondary | ICD-10-CM | POA: Insufficient documentation

## 2018-02-24 DIAGNOSIS — C7800 Secondary malignant neoplasm of unspecified lung: Secondary | ICD-10-CM

## 2018-02-24 LAB — CBC WITH DIFFERENTIAL/PLATELET
Basophils Absolute: 0.1 10*3/uL (ref 0.0–0.1)
Basophils Relative: 1 %
Eosinophils Absolute: 0.1 10*3/uL (ref 0.0–0.5)
Eosinophils Relative: 2 %
HCT: 41.6 % (ref 34.8–46.6)
Hemoglobin: 13.5 g/dL (ref 11.6–15.9)
Lymphocytes Relative: 29 %
Lymphs Abs: 1.8 10*3/uL (ref 0.9–3.3)
MCH: 27.4 pg (ref 25.1–34.0)
MCHC: 32.5 g/dL (ref 31.5–36.0)
MCV: 84.2 fL (ref 79.5–101.0)
Monocytes Absolute: 0.4 10*3/uL (ref 0.1–0.9)
Monocytes Relative: 6 %
Neutro Abs: 3.9 10*3/uL (ref 1.5–6.5)
Neutrophils Relative %: 62 %
Platelets: 225 10*3/uL (ref 145–400)
RBC: 4.94 MIL/uL (ref 3.70–5.45)
RDW: 15.3 % — ABNORMAL HIGH (ref 11.2–14.5)
WBC: 6.2 10*3/uL (ref 3.9–10.3)

## 2018-02-24 LAB — COMPREHENSIVE METABOLIC PANEL
ALT: 48 U/L — ABNORMAL HIGH (ref 0–44)
AST: 23 U/L (ref 15–41)
Albumin: 3.2 g/dL — ABNORMAL LOW (ref 3.5–5.0)
Alkaline Phosphatase: 128 U/L — ABNORMAL HIGH (ref 38–126)
Anion gap: 9 (ref 5–15)
BUN: 14 mg/dL (ref 8–23)
CO2: 31 mmol/L (ref 22–32)
Calcium: 9.5 mg/dL (ref 8.9–10.3)
Chloride: 100 mmol/L (ref 98–111)
Creatinine, Ser: 0.84 mg/dL (ref 0.44–1.00)
GFR calc Af Amer: >60 mL/min (ref >60)
GFR calc non Af Amer: >60 mL/min (ref >60)
Glucose, Bld: 244 mg/dL — ABNORMAL HIGH (ref 70–99)
Potassium: 4.2 mmol/L (ref 3.5–5.1)
Sodium: 140 mmol/L (ref 135–145)
Total Bilirubin: 0.3 mg/dL (ref 0.3–1.2)
Total Protein: 7.2 g/dL (ref 6.5–8.1)

## 2018-02-24 MED ORDER — DIPHENHYDRAMINE HCL 25 MG PO CAPS
25.0000 mg | ORAL_CAPSULE | Freq: Once | ORAL | Status: AC
  Administered 2018-02-24: 25 mg via ORAL

## 2018-02-24 MED ORDER — SODIUM CHLORIDE 0.9 % IV SOLN
900.0000 mg | Freq: Once | INTRAVENOUS | Status: AC
  Administered 2018-02-24: 900 mg via INTRAVENOUS
  Filled 2018-02-24: qty 42.86

## 2018-02-24 MED ORDER — ACETAMINOPHEN 325 MG PO TABS
650.0000 mg | ORAL_TABLET | Freq: Once | ORAL | Status: AC
  Administered 2018-02-24: 650 mg via ORAL

## 2018-02-24 MED ORDER — HEPARIN SOD (PORK) LOCK FLUSH 100 UNIT/ML IV SOLN
500.0000 [IU] | Freq: Once | INTRAVENOUS | Status: AC | PRN
  Administered 2018-02-24: 500 [IU]
  Filled 2018-02-24: qty 5

## 2018-02-24 MED ORDER — ACETAMINOPHEN 325 MG PO TABS
ORAL_TABLET | ORAL | Status: AC
  Filled 2018-02-24: qty 2

## 2018-02-24 MED ORDER — SODIUM CHLORIDE 0.9 % IJ SOLN
10.0000 mL | INTRAMUSCULAR | Status: DC | PRN
  Administered 2018-02-24: 10 mL
  Filled 2018-02-24: qty 10

## 2018-02-24 MED ORDER — LORAZEPAM 2 MG/ML IJ SOLN
0.5000 mg | Freq: Once | INTRAMUSCULAR | Status: AC
Start: 2018-02-24 — End: 2018-02-24
  Administered 2018-02-24: 0.5 mg via INTRAVENOUS

## 2018-02-24 MED ORDER — DIPHENHYDRAMINE HCL 25 MG PO CAPS
ORAL_CAPSULE | ORAL | Status: AC
Start: 2018-02-24 — End: ?
  Filled 2018-02-24: qty 1

## 2018-02-24 MED ORDER — SODIUM CHLORIDE 0.9 % IV SOLN
Freq: Once | INTRAVENOUS | Status: AC
  Administered 2018-02-24: 13:00:00 via INTRAVENOUS

## 2018-02-24 MED ORDER — LORAZEPAM 2 MG/ML IJ SOLN
INTRAMUSCULAR | Status: AC
  Filled 2018-02-24: qty 1

## 2018-02-24 NOTE — Patient Instructions (Signed)
[facility] Discharge Instructions for Patients Receiving Chemotherapy  Today you received the following chemotherapy agents Herceptin. To help prevent nausea and vomiting after your treatment, we encourage you to take your nausea medication s directed  If you develop nausea and vomiting that is not controlled by your nausea medication, call the clinic.   BELOW ARE SYMPTOMS THAT SHOULD BE REPORTED IMMEDIATELY:  *FEVER GREATER THAN 100.5 F  *CHILLS WITH OR WITHOUT FEVER  NAUSEA AND VOMITING THAT IS NOT CONTROLLED WITH YOUR NAUSEA MEDICATION  *UNUSUAL SHORTNESS OF BREATH  *UNUSUAL BRUISING OR BLEEDING  TENDERNESS IN MOUTH AND THROAT WITH OR WITHOUT PRESENCE OF ULCERS  *URINARY PROBLEMS  *BOWEL PROBLEMS  UNUSUAL RASH Items with * indicate a potential emergency and should be followed up as soon as possible.  Feel free to call the clinic should you have any questions or concerns. The clinic phone number is [phone].  Please show the [place] at check-in to the Emergency Department and triage nurse.

## 2018-02-25 LAB — CANCER ANTIGEN 27.29: CA 27.29: 16 U/mL (ref 0.0–38.6)

## 2018-02-26 NOTE — Progress Notes (Signed)
[facility] Progress Note   [name] [id number] 12/18/1950 67 y.o.  [name] is managed by Dr. Jana Hakim  Actively treated with chemotherapy/immunotherapy: yes  Current Therapy: Herceptin  Last Treated: 01/27/2018 (cycle 83, day 1)  Assessment: Plan:    Diabetes mellitus type 2 with neurological manifestations ([name])  Malignant neoplasm of upper-inner quadrant of left breast in female, estrogen receptor positive ([name])   Type 2 diabetes: The patient was instructed to make sure that she is eating a snack before bedtime.  I also encouraged her to check her blood sugar regularly.  ER positive malignant neoplasm of the left breast: The patient will receive cycle 84, day 1 of Herceptin today.  She will follow-up with Dr. Jana Hakim as scheduled on 05/18/2018.  Please see After Visit Summary for patient specific instructions.  Future Appointments  Date Time Provider [place]  03/24/2018  9:45 AM [facility] LAB 1 [facility] None  03/24/2018 10:30 AM [facility] INFUSION [facility] None  04/21/2018  9:45 AM [facility] LAB 1 [facility] None  04/21/2018 10:30 AM [facility] INFUSION [facility] None  05/18/2018 10:30 AM [facility] LAB 2 [facility] None  05/18/2018 11:00 AM Magrinat, Virgie Dad, MD [facility] None  05/18/2018 11:45 AM [facility] INFUSION [facility] None    No orders of the defined types were placed in this encounter.      Subjective:   Patient ID:  [name] is a 67 y.o. (DOB 02-03-1951) female.  Chief Complaint: No chief complaint on file.   HPI [name] is a 67 year old female with a diagnosis of an ER positive malignant neoplasm of the left breast.  She is followed by Dr. Jana Hakim and presents for ongoing treatment with Herceptin today.  She has a history of diabetes.  She does not check her blood sugar regularly.  She reports that she was unable to get new test strips for her meter from her primary care provider.  She is on  short-acting and long-acting insulin.  She has not eaten anything since yesterday.  She is having dizziness today.  I was asked to see the patient while she was in the infusion room.  Medications: I have reviewed the patient's current medications.  Allergies:  Allergies  Allergen Reactions  . Meperidine Hcl Anaphylaxis  . Penicillins Anaphylaxis    Has patient had a PCN reaction causing immediate rash, facial/tongue/throat swelling, SOB or lightheadedness with hypotension: yes Has patient had a PCN reaction causing severe rash involving mucus membranes or skin necrosis: no Has patient had a PCN reaction that required hospitalization yes Has patient had a PCN reaction occurring within the last 10 years: no If all of the above answers are "NO", then may proceed with Cephalosporin use.   Marland Kitchen Amoxicillin     REACTION: unspecified  . Aspirin Nausea And Vomiting    REACTION: unspecified  . Percocet [Oxycodone-Acetaminophen]     Past Medical History:  Diagnosis Date  . Arthritis   . Back pain   . Breast cancer ([place]) dx'd 11/2012   left  . Chest pain   . Diabetes mellitus without complication ([place]) 10/01/863  . Fatty liver 6/03  . Hypertension   . Lung disease   . Obesity, unspecified   . Other abnormal glucose   . Suicide attempt ([place]) 1996  . Syncope and collapse   . Unspecified sleep apnea     Past Surgical History:  Procedure Laterality Date  . CARDIAC CATHETERIZATION     2007  . CHOLECYSTECTOMY    .  TUBAL LIGATION      Family History  Problem Relation Age of Onset  . Coronary artery disease Father 23  . Diabetes Father   . Heart disease Father   . Breast cancer Mother 69  . Cancer Mother 78       breast  . Coronary artery disease Sister 37  . Coronary artery disease Brother 21  . Cancer Maternal Aunt 40       ovarian  . Cancer Maternal Grandmother 55       ovarian  . Cancer Paternal Aunt 56       ovarian/breast/breast    Social History   Socioeconomic  History  . Marital status: Divorced    Spouse name: Not on file  . Number of children: 4  . Years of education: some colle  . Highest education level: Not on file  Occupational History  . Occupation: Disability  . Occupation: retired-daycare  Social Needs  . Financial resource strain: Not on file  . Food insecurity:    Worry: Not on file    Inability: Not on file  . Transportation needs:    Medical: Not on file    Non-medical: Not on file  Tobacco Use  . Smoking status: Former Smoker    Packs/day: 3.00    Years: 5.00    Pack years: 15.00    Types: Cigarettes    Last attempt to quit: 08/18/1968    Years since quitting: 49.5  . Smokeless tobacco: Never Used  Substance and Sexual Activity  . Alcohol use: No    Alcohol/week: 0.0 oz  . Drug use: No  . Sexual activity: Not Currently  Lifestyle  . Physical activity:    Days per week: Not on file    Minutes per session: Not on file  . Stress: Not on file  Relationships  . Social connections:    Talks on phone: Not on file    Gets together: Not on file    Attends religious service: Not on file    Active member of club or organization: Not on file    Attends meetings of clubs or organizations: Not on file    Relationship status: Not on file  . Intimate partner violence:    Fear of current or ex partner: Not on file    Emotionally abused: Not on file    Physically abused: Not on file    Forced sexual activity: Not on file  Other Topics Concern  . Not on file  Social History Narrative   Health Care POA:    Emergency Contact: Thomasenia Bottoms [phone]   End of Life Plan:    Who lives with you: two grandchildren, friend, daughter   Any pets: 3 poodles   Diet: Pt has a variety of protein, starch, vegetables.  Pt is currently working on cutting back on portions for weight loss.   Exercise: Pt has not regular exercise routine.  Occasionally walks around home.   Seatbelts: Pt reports wearing seatbelt occasionally.   Sun  Exposure/Protection: Pt does not use sun protection   Hobbies: reading, playing on kindle, ebay         Currently in her home she keeps her granddaughter [name], 19, who is the daughter of the patient's daughter [name] (the patient refers to [name] as "my adopted daughter"); grandson Weinfeld "[name]" [place], [place], who is [name]'s half-brother; [name], and an [place] friend, [name], the patient's significant other. [name] is a Network engineer, currently  unemployed. Son [name] works as an Clinical biochemist in [place]. Daughter [name] is currently in prison due to killing someone in a car accident. Daughter [name] died from aplastic anemia at the age of 50. The patient has a total of 4 grandchildren. She is not a Ambulance person    Past Medical History, Surgical history, Social history, and Family history were reviewed and updated as appropriate.   Please see review of systems for further details on the patient's review from today.   Review of Systems:  Review of Systems  Constitutional: Negative for appetite change, chills, diaphoresis and fever.  HENT: Negative for dental problem, mouth sores and trouble swallowing.   Respiratory: Negative for cough, chest tightness and shortness of breath.   Cardiovascular: Negative for chest pain and palpitations.  Gastrointestinal: Negative for constipation, diarrhea, nausea and vomiting.  Neurological: Positive for dizziness. Negative for syncope, weakness and headaches.    Objective:   Physical Exam:  There were no vitals taken for this visit. ECOG: 1  Physical Exam  Constitutional: No distress.  HENT:  Head: Normocephalic and atraumatic.  Cardiovascular: Normal rate, regular rhythm and normal heart sounds. Exam reveals no gallop and no friction rub.  No murmur heard. Pulmonary/Chest: Effort normal and breath sounds normal. No respiratory distress. She has no wheezes. She has no rales.   Neurological: She is alert.  Skin: Skin is warm and dry. No rash noted. She is not diaphoretic. No erythema.    Lab Review:     Component Value Date/Time   NA 140 02/24/2018 1021   NA 141 08/05/2017 0927   K 4.2 02/24/2018 1021   K 4.0 08/05/2017 0927   CL 100 02/24/2018 1021   CL 101 02/07/2013 1125   CO2 31 02/24/2018 1021   CO2 31 (H) 08/05/2017 0927   GLUCOSE 244 (H) 02/24/2018 1021   GLUCOSE 125 08/05/2017 0927   GLUCOSE 193 (H) 02/07/2013 1125   BUN 14 02/24/2018 1021   BUN 22.7 08/05/2017 0927   CREATININE 0.84 02/24/2018 1021   CREATININE 0.8 08/05/2017 0927   CALCIUM 9.5 02/24/2018 1021   CALCIUM 9.7 08/05/2017 0927   PROT 7.2 02/24/2018 1021   PROT 7.3 08/05/2017 0927   ALBUMIN 3.2 (L) 02/24/2018 1021   ALBUMIN 3.2 (L) 08/05/2017 0927   AST 23 02/24/2018 1021   AST 19 08/05/2017 0927   ALT 48 (H) 02/24/2018 1021   ALT 28 08/05/2017 0927   ALKPHOS 128 (H) 02/24/2018 1021   ALKPHOS 108 08/05/2017 0927   BILITOT 0.3 02/24/2018 1021   BILITOT 0.34 08/05/2017 0927   GFRNONAA >60 02/24/2018 1021   GFRAA >60 02/24/2018 1021       Component Value Date/Time   WBC 6.2 02/24/2018 1021   RBC 4.94 02/24/2018 1021   HGB 13.5 02/24/2018 1021   HGB 12.8 08/05/2017 0927   HCT 41.6 02/24/2018 1021   HCT 39.8 08/05/2017 0927   PLT 225 02/24/2018 1021   PLT 267 08/05/2017 0927   MCV 84.2 02/24/2018 1021   MCV 86.7 08/05/2017 0927   MCH 27.4 02/24/2018 1021   MCHC 32.5 02/24/2018 1021   RDW 15.3 (H) 02/24/2018 1021   RDW 14.3 08/05/2017 0927   LYMPHSABS 1.8 02/24/2018 1021   LYMPHSABS 2.3 08/05/2017 0927   MONOABS 0.4 02/24/2018 1021   MONOABS 0.4 08/05/2017 0927   EOSABS 0.1 02/24/2018 1021   EOSABS 0.1 08/05/2017 0927   BASOSABS 0.1 02/24/2018 1021   BASOSABS 0.0 08/05/2017 0927   -------------------------------  Imaging from last 24 hours (if applicable):  Radiology interpretation: No results found.

## 2018-02-28 DIAGNOSIS — I5020 Unspecified systolic (congestive) heart failure: Secondary | ICD-10-CM | POA: Diagnosis not present

## 2018-03-17 ENCOUNTER — Other Ambulatory Visit: Payer: Self-pay | Admitting: Family Medicine

## 2018-03-22 ENCOUNTER — Other Ambulatory Visit: Payer: Self-pay

## 2018-03-22 MED ORDER — AZITHROMYCIN 250 MG PO TABS: 250.0000 mg | ORAL_TABLET | Freq: Every day | ORAL | 0 refills | Status: DC

## 2018-03-24 ENCOUNTER — Inpatient Hospital Stay (HOSPITAL_BASED_OUTPATIENT_CLINIC_OR_DEPARTMENT_OTHER): Payer: Medicare Other | Admitting: Medical

## 2018-03-24 ENCOUNTER — Telehealth: Payer: Self-pay | Admitting: Medical

## 2018-03-24 ENCOUNTER — Inpatient Hospital Stay: Payer: Medicare Other | Attending: Oncology

## 2018-03-24 ENCOUNTER — Inpatient Hospital Stay: Payer: Medicare Other

## 2018-03-24 ENCOUNTER — Other Ambulatory Visit: Payer: Self-pay | Admitting: Medical

## 2018-03-24 VITALS — BP 185/78 | HR 62 | Temp 98.1°F | Resp 14

## 2018-03-24 DIAGNOSIS — Z79811 Long term (current) use of aromatase inhibitors: Secondary | ICD-10-CM | POA: Insufficient documentation

## 2018-03-24 DIAGNOSIS — R590 Localized enlarged lymph nodes: Secondary | ICD-10-CM | POA: Diagnosis not present

## 2018-03-24 DIAGNOSIS — C50919 Malignant neoplasm of unspecified site of unspecified female breast: Secondary | ICD-10-CM

## 2018-03-24 DIAGNOSIS — C50212 Malignant neoplasm of upper-inner quadrant of left female breast: Secondary | ICD-10-CM

## 2018-03-24 DIAGNOSIS — C7800 Secondary malignant neoplasm of unspecified lung: Secondary | ICD-10-CM | POA: Diagnosis not present

## 2018-03-24 DIAGNOSIS — Z170 Estrogen receptor positive status [ER+]: Secondary | ICD-10-CM

## 2018-03-24 LAB — CBC WITH DIFFERENTIAL/PLATELET
Basophils Absolute: 0 10*3/uL (ref 0.0–0.1)
Basophils Relative: 0 %
Eosinophils Absolute: 0.2 10*3/uL (ref 0.0–0.5)
Eosinophils Relative: 3 %
HCT: 43.2 % (ref 34.8–46.6)
Hemoglobin: 13.9 g/dL (ref 11.6–15.9)
Lymphocytes Relative: 32 %
Lymphs Abs: 2.1 10*3/uL (ref 0.9–3.3)
MCH: 28.1 pg (ref 25.1–34.0)
MCHC: 32.2 g/dL (ref 31.5–36.0)
MCV: 87.3 fL (ref 79.5–101.0)
Monocytes Absolute: 0.5 10*3/uL (ref 0.1–0.9)
Monocytes Relative: 7 %
Neutro Abs: 4 10*3/uL (ref 1.5–6.5)
Neutrophils Relative %: 58 %
Platelets: 229 10*3/uL (ref 145–400)
RBC: 4.95 MIL/uL (ref 3.70–5.45)
RDW: 14.8 % — ABNORMAL HIGH (ref 11.2–14.5)
WBC: 6.7 10*3/uL (ref 3.9–10.3)

## 2018-03-24 LAB — COMPREHENSIVE METABOLIC PANEL
ALT: 69 U/L — ABNORMAL HIGH (ref 0–44)
AST: 31 U/L (ref 15–41)
Albumin: 3.2 g/dL — ABNORMAL LOW (ref 3.5–5.0)
Alkaline Phosphatase: 161 U/L — ABNORMAL HIGH (ref 38–126)
Anion gap: 10 (ref 5–15)
BUN: 11 mg/dL (ref 8–23)
CO2: 33 mmol/L — ABNORMAL HIGH (ref 22–32)
Calcium: 9.5 mg/dL (ref 8.9–10.3)
Chloride: 99 mmol/L (ref 98–111)
Creatinine, Ser: 0.75 mg/dL (ref 0.44–1.00)
GFR calc Af Amer: >60 mL/min (ref >60)
GFR calc non Af Amer: >60 mL/min (ref >60)
Glucose, Bld: 163 mg/dL — ABNORMAL HIGH (ref 70–99)
Potassium: 4.3 mmol/L (ref 3.5–5.1)
Sodium: 142 mmol/L (ref 135–145)
Total Bilirubin: 0.3 mg/dL (ref 0.3–1.2)
Total Protein: 7.5 g/dL (ref 6.5–8.1)

## 2018-03-24 MED ORDER — SODIUM CHLORIDE 0.9 % IJ SOLN
10.0000 mL | INTRAMUSCULAR | Status: DC | PRN
  Administered 2018-03-24: 10 mL
  Filled 2018-03-24: qty 10

## 2018-03-24 MED ORDER — CLINDAMYCIN HCL 150 MG PO CAPS
150.0000 mg | ORAL_CAPSULE | Freq: Three times a day (TID) | ORAL | 0 refills | Status: DC
Start: 2018-03-24 — End: 2019-06-15

## 2018-03-24 MED ORDER — DIPHENHYDRAMINE HCL 25 MG PO CAPS
25.0000 mg | ORAL_CAPSULE | Freq: Once | ORAL | Status: AC
  Administered 2018-03-24: 25 mg via ORAL

## 2018-03-24 MED ORDER — SODIUM CHLORIDE 0.9 % IV SOLN
Freq: Once | INTRAVENOUS | Status: AC
  Administered 2018-03-24: 11:00:00 via INTRAVENOUS
  Filled 2018-03-24: qty 250

## 2018-03-24 MED ORDER — ACETAMINOPHEN 325 MG PO TABS
ORAL_TABLET | ORAL | Status: AC
  Filled 2018-03-24: qty 2

## 2018-03-24 MED ORDER — DIPHENHYDRAMINE HCL 25 MG PO CAPS
ORAL_CAPSULE | ORAL | Status: AC
  Filled 2018-03-24: qty 1

## 2018-03-24 MED ORDER — TRASTUZUMAB CHEMO 150 MG IV SOLR
900.0000 mg | Freq: Once | INTRAVENOUS | Status: AC
  Administered 2018-03-24: 900 mg via INTRAVENOUS
  Filled 2018-03-24: qty 42.86

## 2018-03-24 MED ORDER — ACETAMINOPHEN 325 MG PO TABS
650.0000 mg | ORAL_TABLET | Freq: Once | ORAL | Status: AC
  Administered 2018-03-24: 650 mg via ORAL

## 2018-03-24 MED ORDER — LORAZEPAM 2 MG/ML IJ SOLN
0.5000 mg | Freq: Once | INTRAMUSCULAR | Status: AC
  Administered 2018-03-24: 0.5 mg via INTRAVENOUS

## 2018-03-24 MED ORDER — HEPARIN SOD (PORK) LOCK FLUSH 100 UNIT/ML IV SOLN
500.0000 [IU] | Freq: Once | INTRAVENOUS | Status: AC | PRN
  Administered 2018-03-24: 500 [IU]
  Filled 2018-03-24: qty 5

## 2018-03-24 MED ORDER — ACETAMINOPHEN 325 MG PO TABS
ORAL_TABLET | ORAL | Status: AC
  Filled 2018-03-24: qty 1

## 2018-03-24 MED ORDER — LORAZEPAM 2 MG/ML IJ SOLN
INTRAMUSCULAR | Status: AC
  Filled 2018-03-24: qty 1

## 2018-03-24 NOTE — Telephone Encounter (Signed)
No 8/7 los/orders/referrals

## 2018-03-24 NOTE — Patient Instructions (Signed)
[facility] Discharge Instructions for Patients Receiving Chemotherapy  Today you received the following chemotherapy agents Herceptin. To help prevent nausea and vomiting after your treatment, we encourage you to take your nausea medication s directed  If you develop nausea and vomiting that is not controlled by your nausea medication, call the clinic.   BELOW ARE SYMPTOMS THAT SHOULD BE REPORTED IMMEDIATELY:  *FEVER GREATER THAN 100.5 F  *CHILLS WITH OR WITHOUT FEVER  NAUSEA AND VOMITING THAT IS NOT CONTROLLED WITH YOUR NAUSEA MEDICATION  *UNUSUAL SHORTNESS OF BREATH  *UNUSUAL BRUISING OR BLEEDING  TENDERNESS IN MOUTH AND THROAT WITH OR WITHOUT PRESENCE OF ULCERS  *URINARY PROBLEMS  *BOWEL PROBLEMS  UNUSUAL RASH Items with * indicate a potential emergency and should be followed up as soon as possible.  Feel free to call the clinic should you have any questions or concerns. The clinic phone number is [phone].  Please show the [place] at check-in to the Emergency Department and triage nurse.

## 2018-03-24 NOTE — Telephone Encounter (Signed)
Patient scheduled per 8/7 sch message

## 2018-03-25 LAB — CANCER ANTIGEN 27.29: CA 27.29: 24 U/mL (ref 0.0–38.6)

## 2018-03-26 NOTE — Progress Notes (Signed)
Symptoms Management Clinic Progress Note   [name] [id number] [birth year] 67 y.o.  [name] is managed by Dr. 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  She was given a prescription for clindamycin 150 mg p.o. 3 times daily x7 days.  She was told to start a probiotic.  The patient will follow-up if her lymphadenopathy does not improve after dosing with clindamycin at which time consideration would be given for a CT scan of her neck and face.  Metastatic breast cancer with lung metastasis: The patient is currently treated with Herceptin and letrozole and presents to the clinic today for cycle 85, day 1 of Herceptin.  She is scheduled to see Dr. Jana Hakim on 05/18/2018.  Please see After Visit Summary for patient specific instructions.  Future Appointments  Date Time Provider [place]  04/06/2018  9:30 AM [name], MD [facility]  04/21/2018  9:45 AM [facility] LAB 1 [facility] None  04/21/2018 10:30 AM [facility] INFUSION [facility] None  05/18/2018 10:30 AM [facility] LAB 2 [facility] None  05/18/2018 11:00 AM Magrinat, Virgie Dad, MD [facility] None  05/18/2018 11:45 AM [facility] INFUSION [facility] None    No orders of the defined types were placed in this encounter.      Subjective:   Patient ID:  [name] is a 67 y.o. (DOB 1950/10/13) female.  Chief Complaint: No chief complaint on file.   HPI KAMEREN Moss is a 67 year old female with a history of a metastatic breast cancer who is managed by Dr. Jana Hakim and is treated with letrozole and Herceptin.  She was seen in infusion today while being dosed with Herceptin.  She  reports having a hard raised mass in the left face along the preauricular area.  She had been given a prescription for azithromycin.  She reports having left ear pain which she believes is secondary to this mass.  She notes that this pain is increased with swallowing.  She denies fevers, chills, or sweats.  Medications: I have reviewed the patient's current medications.  Allergies:  Allergies  Allergen Reactions  . Meperidine Hcl Anaphylaxis  . Penicillins Anaphylaxis    Has patient had a PCN reaction causing immediate rash, facial/tongue/throat swelling, SOB or lightheadedness with hypotension: yes Has patient had a PCN reaction causing severe rash involving mucus membranes or skin necrosis: no Has patient had a PCN reaction that required hospitalization yes Has patient had a PCN reaction occurring within the last 10 years: no If all of the above answers are "NO", then may proceed with Cephalosporin use.   Marland Kitchen Amoxicillin     REACTION: unspecified  . Aspirin Nausea And Vomiting    REACTION: unspecified  . Percocet [Oxycodone-Acetaminophen]     Past Medical History:  Diagnosis Date  . Arthritis   . Back pain   . Breast cancer ([place]) dx'd 11/2012   left  . Chest pain   . Diabetes mellitus without complication ([place]) 7/82/9562  . Fatty liver 6/03  . Hypertension   . Lung disease   . Obesity, unspecified   . Other abnormal glucose   . Suicide attempt ([place]) 1996  . Syncope and collapse   . Unspecified sleep apnea     Past Surgical  History:  Procedure Laterality Date  . CARDIAC CATHETERIZATION     2007  . CHOLECYSTECTOMY    . TUBAL LIGATION      Family History  Problem Relation Age of Onset  . Coronary artery disease Father 49  . Diabetes Father   . Heart disease Father   . Breast cancer Mother 80  . Cancer Mother 60       breast  . Coronary artery disease Sister 57  . Coronary artery disease Brother 54  . Cancer Maternal Aunt 40       ovarian  . Cancer Maternal  Grandmother 55       ovarian  . Cancer Paternal Aunt 41       ovarian/breast/breast    Social History   Socioeconomic History  . Marital status: Divorced    Spouse name: Not on file  . Number of children: 4  . Years of education: some colle  . Highest education level: Not on file  Occupational History  . Occupation: Disability  . Occupation: retired-daycare  Social Needs  . Financial resource strain: Not on file  . Food insecurity:    Worry: Not on file    Inability: Not on file  . Transportation needs:    Medical: Not on file    Non-medical: Not on file  Tobacco Use  . Smoking status: Former Smoker    Packs/day: 3.00    Years: 5.00    Pack years: 15.00    Types: Cigarettes    Last attempt to quit: 08/18/1968    Years since quitting: 49.6  . Smokeless tobacco: Never Used  Substance and Sexual Activity  . Alcohol use: No    Alcohol/week: 0.0 standard drinks  . Drug use: No  . Sexual activity: Not Currently  Lifestyle  . Physical activity:    Days per week: Not on file    Minutes per session: Not on file  . Stress: Not on file  Relationships  . Social connections:    Talks on phone: Not on file    Gets together: Not on file    Attends religious service: Not on file    Active member of club or organization: Not on file    Attends meetings of clubs or organizations: Not on file    Relationship status: Not on file  . Intimate partner violence:    Fear of current or ex partner: Not on file    Emotionally abused: Not on file    Physically abused: Not on file    Forced sexual activity: Not on file  Other Topics Concern  . Not on file  Social History Narrative   Health Care POA:    Emergency Contact: Thomasenia Bottoms [phone]   End of Life Plan:    Who lives with you: two grandchildren, friend, daughter   Any pets: 3 poodles   Diet: Pt has a variety of protein, starch, vegetables.  Pt is currently working on cutting back on portions for weight loss.   Exercise: Pt  has not regular exercise routine.  Occasionally walks around home.   Seatbelts: Pt reports wearing seatbelt occasionally.   Sun Exposure/Protection: Pt does not use sun protection   Hobbies: reading, playing on kindle, ebay         Currently in her home she keeps her granddaughter [name], 43, who is the daughter of the patient's daughter [name] (the patient refers to [name] as "my adopted daughter"); grandson Sahlin "[name]" [place], [place], who is  [name]'s half-brother; [name], and an [place] friend, [name], the patient's significant other. [name] is a Network engineer, currently unemployed. Son [name] works as an Clinical biochemist in [place]. Daughter [name] is currently in prison due to killing someone in a car accident. Daughter [name] died from aplastic anemia at the age of 72. The patient has a total of 4 grandchildren. She is not a Ambulance person    Past Medical History, Surgical history, Social history, and Family history were reviewed and updated as appropriate.   Please see review of systems for further details on the patient's review from today.   Review of Systems:  Review of Systems  Constitutional: Negative for chills, diaphoresis and fever.  HENT: Positive for ear pain and facial swelling. Negative for trouble swallowing and voice change.   Respiratory: Negative for cough, chest tightness, shortness of breath and wheezing.   Cardiovascular: Negative for chest pain and palpitations.  Gastrointestinal: Negative for abdominal pain, constipation, diarrhea, nausea and vomiting.  Musculoskeletal: Negative for back pain and myalgias.  Neurological: Negative for dizziness, light-headedness and headaches.    Objective:   Physical Exam:  There were no vitals taken for this visit. ECOG: 1  Physical Exam  Constitutional: No distress.  HENT:  Head: Normocephalic and atraumatic.    Mouth/Throat: Oropharynx is clear and  moist. No oropharyngeal exudate.  Eyes: Conjunctivae are normal. Right eye exhibits no discharge. Left eye exhibits no discharge. No scleral icterus.  Cardiovascular: Normal rate, regular rhythm and normal heart sounds. Exam reveals no gallop and no friction rub.  No murmur heard. Pulmonary/Chest: Effort normal and breath sounds normal. No respiratory distress. She has no wheezes. She has no rales.  Neurological: She is alert.  Skin: Skin is warm and dry. No rash noted. She is not diaphoretic. No erythema.    Lab Review:     Component Value Date/Time   NA 142 03/24/2018 1004   NA 141 08/05/2017 0927   K 4.3 03/24/2018 1004   K 4.0 08/05/2017 0927   CL 99 03/24/2018 1004   CL 101 02/07/2013 1125   CO2 33 (H) 03/24/2018 1004   CO2 31 (H) 08/05/2017 0927   GLUCOSE 163 (H) 03/24/2018 1004   GLUCOSE 125 08/05/2017 0927   GLUCOSE 193 (H) 02/07/2013 1125   BUN 11 03/24/2018 1004   BUN 22.7 08/05/2017 0927   CREATININE 0.75 03/24/2018 1004   CREATININE 0.8 08/05/2017 0927   CALCIUM 9.5 03/24/2018 1004   CALCIUM 9.7 08/05/2017 0927   PROT 7.5 03/24/2018 1004   PROT 7.3 08/05/2017 0927   ALBUMIN 3.2 (L) 03/24/2018 1004   ALBUMIN 3.2 (L) 08/05/2017 0927   AST 31 03/24/2018 1004   AST 19 08/05/2017 0927   ALT 69 (H) 03/24/2018 1004   ALT 28 08/05/2017 0927   ALKPHOS 161 (H) 03/24/2018 1004   ALKPHOS 108 08/05/2017 0927   BILITOT 0.3 03/24/2018 1004   BILITOT 0.34 08/05/2017 0927   GFRNONAA >60 03/24/2018 1004   GFRAA >60 03/24/2018 1004       Component Value Date/Time   WBC 6.7 03/24/2018 1004   RBC 4.95 03/24/2018 1004   HGB 13.9 03/24/2018 1004   HGB 12.8 08/05/2017 0927   HCT 43.2 03/24/2018 1004   HCT 39.8 08/05/2017 0927   PLT 229 03/24/2018 1004   PLT 267 08/05/2017 0927   MCV 87.3 03/24/2018 1004   MCV 86.7 08/05/2017 0927   MCH 28.1 03/24/2018 1004   MCHC 32.2 03/24/2018  1004   RDW 14.8 (H) 03/24/2018 1004   RDW 14.3 08/05/2017 0927   LYMPHSABS 2.1 03/24/2018  1004   LYMPHSABS 2.3 08/05/2017 0927   MONOABS 0.5 03/24/2018 1004   MONOABS 0.4 08/05/2017 0927   EOSABS 0.2 03/24/2018 1004   EOSABS 0.1 08/05/2017 0927   BASOSABS 0.0 03/24/2018 1004   BASOSABS 0.0 08/05/2017 0927   -------------------------------  Imaging from last 24 hours (if applicable):  Radiology interpretation: No results found.

## 2018-03-31 DIAGNOSIS — I5020 Unspecified systolic (congestive) heart failure: Secondary | ICD-10-CM | POA: Diagnosis not present

## 2018-04-06 ENCOUNTER — Ambulatory Visit: Payer: Medicare Other | Admitting: Family Medicine

## 2018-04-21 ENCOUNTER — Inpatient Hospital Stay: Payer: Medicare Other | Attending: Oncology

## 2018-04-21 ENCOUNTER — Inpatient Hospital Stay: Payer: Medicare Other

## 2018-04-21 VITALS — BP 158/69 | HR 53 | Temp 97.7°F | Resp 16

## 2018-04-21 DIAGNOSIS — C50212 Malignant neoplasm of upper-inner quadrant of left female breast: Secondary | ICD-10-CM | POA: Insufficient documentation

## 2018-04-21 DIAGNOSIS — Z79811 Long term (current) use of aromatase inhibitors: Secondary | ICD-10-CM | POA: Insufficient documentation

## 2018-04-21 DIAGNOSIS — Z170 Estrogen receptor positive status [ER+]: Secondary | ICD-10-CM | POA: Diagnosis not present

## 2018-04-21 DIAGNOSIS — C7800 Secondary malignant neoplasm of unspecified lung: Secondary | ICD-10-CM

## 2018-04-21 DIAGNOSIS — C50919 Malignant neoplasm of unspecified site of unspecified female breast: Secondary | ICD-10-CM

## 2018-04-21 DIAGNOSIS — Z5112 Encounter for antineoplastic immunotherapy: Secondary | ICD-10-CM | POA: Diagnosis not present

## 2018-04-21 LAB — CBC WITH DIFFERENTIAL/PLATELET
Basophils Absolute: 0 10*3/uL (ref 0.0–0.1)
Basophils Relative: 0 %
Eosinophils Absolute: 0.1 10*3/uL (ref 0.0–0.5)
Eosinophils Relative: 2 %
HCT: 42.5 % (ref 34.8–46.6)
Hemoglobin: 13.4 g/dL (ref 11.6–15.9)
Lymphocytes Relative: 34 %
Lymphs Abs: 2 10*3/uL (ref 0.9–3.3)
MCH: 27.7 pg (ref 25.1–34.0)
MCHC: 31.5 g/dL (ref 31.5–36.0)
MCV: 87.8 fL (ref 79.5–101.0)
Monocytes Absolute: 0.4 10*3/uL (ref 0.1–0.9)
Monocytes Relative: 8 %
Neutro Abs: 3.4 10*3/uL (ref 1.5–6.5)
Neutrophils Relative %: 56 %
Platelets: 211 10*3/uL (ref 145–400)
RBC: 4.84 MIL/uL (ref 3.70–5.45)
RDW: 15.4 % — ABNORMAL HIGH (ref 11.2–14.5)
WBC: 5.9 10*3/uL (ref 3.9–10.3)

## 2018-04-21 LAB — COMPREHENSIVE METABOLIC PANEL
ALT: 72 U/L — ABNORMAL HIGH (ref 0–44)
AST: 47 U/L — ABNORMAL HIGH (ref 15–41)
Albumin: 3.2 g/dL — ABNORMAL LOW (ref 3.5–5.0)
Alkaline Phosphatase: 181 U/L — ABNORMAL HIGH (ref 38–126)
Anion gap: 7 (ref 5–15)
BUN: 15 mg/dL (ref 8–23)
CO2: 33 mmol/L — ABNORMAL HIGH (ref 22–32)
Calcium: 9.7 mg/dL (ref 8.9–10.3)
Chloride: 102 mmol/L (ref 98–111)
Creatinine, Ser: 0.75 mg/dL (ref 0.44–1.00)
GFR calc Af Amer: >60 mL/min (ref >60)
GFR calc non Af Amer: >60 mL/min (ref >60)
Glucose, Bld: 177 mg/dL — ABNORMAL HIGH (ref 70–99)
Potassium: 4.4 mmol/L (ref 3.5–5.1)
Sodium: 142 mmol/L (ref 135–145)
Total Bilirubin: 0.4 mg/dL (ref 0.3–1.2)
Total Protein: 7.2 g/dL (ref 6.5–8.1)

## 2018-04-21 MED ORDER — LORAZEPAM 2 MG/ML IJ SOLN
0.5000 mg | Freq: Once | INTRAMUSCULAR | Status: AC
  Administered 2018-04-21: 0.5 mg via INTRAVENOUS

## 2018-04-21 MED ORDER — ACETAMINOPHEN 325 MG PO TABS
650.0000 mg | ORAL_TABLET | Freq: Once | ORAL | Status: AC
  Administered 2018-04-21: 650 mg via ORAL

## 2018-04-21 MED ORDER — SODIUM CHLORIDE 0.9 % IV SOLN
Freq: Once | INTRAVENOUS | Status: AC
  Administered 2018-04-21: 11:00:00 via INTRAVENOUS
  Filled 2018-04-21: qty 250

## 2018-04-21 MED ORDER — ACETAMINOPHEN 325 MG PO TABS
ORAL_TABLET | ORAL | Status: AC
  Filled 2018-04-21: qty 2

## 2018-04-21 MED ORDER — DIPHENHYDRAMINE HCL 25 MG PO CAPS
ORAL_CAPSULE | ORAL | Status: AC
  Filled 2018-04-21: qty 1

## 2018-04-21 MED ORDER — TRASTUZUMAB CHEMO 150 MG IV SOLR
900.0000 mg | Freq: Once | INTRAVENOUS | Status: AC
  Administered 2018-04-21: 900 mg via INTRAVENOUS
  Filled 2018-04-21: qty 42.86

## 2018-04-21 MED ORDER — SODIUM CHLORIDE 0.9 % IJ SOLN
10.0000 mL | INTRAMUSCULAR | Status: DC | PRN
  Administered 2018-04-21: 10 mL
  Filled 2018-04-21: qty 10

## 2018-04-21 MED ORDER — LORAZEPAM 2 MG/ML IJ SOLN
INTRAMUSCULAR | Status: AC
  Filled 2018-04-21: qty 1

## 2018-04-21 MED ORDER — HEPARIN SOD (PORK) LOCK FLUSH 100 UNIT/ML IV SOLN
500.0000 [IU] | Freq: Once | INTRAVENOUS | Status: AC | PRN
  Administered 2018-04-21: 500 [IU]
  Filled 2018-04-21: qty 5

## 2018-04-21 MED ORDER — DIPHENHYDRAMINE HCL 25 MG PO CAPS
25.0000 mg | ORAL_CAPSULE | Freq: Once | ORAL | Status: AC
  Administered 2018-04-21: 25 mg via ORAL

## 2018-04-21 NOTE — Patient Instructions (Signed)
[facility] Discharge Instructions for Patients Receiving Chemotherapy  Today you received the following chemotherapy agents Herceptin  To help prevent nausea and vomiting after your treatment, we encourage you to take your nausea medication as directed   If you develop nausea and vomiting that is not controlled by your nausea medication, call the clinic.   BELOW ARE SYMPTOMS THAT SHOULD BE REPORTED IMMEDIATELY:  *FEVER GREATER THAN 100.5 F  *CHILLS WITH OR WITHOUT FEVER  NAUSEA AND VOMITING THAT IS NOT CONTROLLED WITH YOUR NAUSEA MEDICATION  *UNUSUAL SHORTNESS OF BREATH  *UNUSUAL BRUISING OR BLEEDING  TENDERNESS IN MOUTH AND THROAT WITH OR WITHOUT PRESENCE OF ULCERS  *URINARY PROBLEMS  *BOWEL PROBLEMS  UNUSUAL RASH Items with * indicate a potential emergency and should be followed up as soon as possible.  Feel free to call the clinic should you have any questions or concerns. The clinic phone number is [phone].  Please show the CHEMO ALERT CARD at check-in to the Emergency Department and triage nurse.   

## 2018-04-22 LAB — CANCER ANTIGEN 27.29: CA 27.29: 19.2 U/mL (ref 0.0–38.6)

## 2018-04-27 ENCOUNTER — Telehealth: Payer: Self-pay | Admitting: Family Medicine

## 2018-04-27 NOTE — Telephone Encounter (Signed)
I tried calling patient to set up her AWV. Her voicemail is full. If patient call back please help her set up her AWV. jw

## 2018-05-01 DIAGNOSIS — I5020 Unspecified systolic (congestive) heart failure: Secondary | ICD-10-CM | POA: Diagnosis not present
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
## 2018-05-18 ENCOUNTER — Inpatient Hospital Stay: Payer: Medicare Other

## 2018-05-18 ENCOUNTER — Inpatient Hospital Stay: Payer: Medicare Other | Attending: Oncology

## 2018-05-18 ENCOUNTER — Telehealth: Payer: Self-pay | Admitting: Oncology

## 2018-05-18 ENCOUNTER — Other Ambulatory Visit: Payer: Self-pay | Admitting: Family Medicine

## 2018-05-18 ENCOUNTER — Inpatient Hospital Stay (HOSPITAL_BASED_OUTPATIENT_CLINIC_OR_DEPARTMENT_OTHER): Payer: Medicare Other | Admitting: Oncology

## 2018-05-18 VITALS — BP 151/81 | HR 64 | Temp 97.8°F | Resp 18 | Ht 65.0 in | Wt 331.7 lb

## 2018-05-18 DIAGNOSIS — Z79899 Other long term (current) drug therapy: Secondary | ICD-10-CM

## 2018-05-18 DIAGNOSIS — M549 Dorsalgia, unspecified: Secondary | ICD-10-CM

## 2018-05-18 DIAGNOSIS — C7800 Secondary malignant neoplasm of unspecified lung: Secondary | ICD-10-CM

## 2018-05-18 DIAGNOSIS — Z79811 Long term (current) use of aromatase inhibitors: Secondary | ICD-10-CM | POA: Diagnosis not present

## 2018-05-18 DIAGNOSIS — R079 Chest pain, unspecified: Secondary | ICD-10-CM | POA: Insufficient documentation

## 2018-05-18 DIAGNOSIS — Z5112 Encounter for antineoplastic immunotherapy: Secondary | ICD-10-CM | POA: Diagnosis not present

## 2018-05-18 DIAGNOSIS — Z86718 Personal history of other venous thrombosis and embolism: Secondary | ICD-10-CM | POA: Diagnosis not present

## 2018-05-18 DIAGNOSIS — C50212 Malignant neoplasm of upper-inner quadrant of left female breast: Secondary | ICD-10-CM

## 2018-05-18 DIAGNOSIS — Z171 Estrogen receptor negative status [ER-]: Secondary | ICD-10-CM | POA: Insufficient documentation

## 2018-05-18 DIAGNOSIS — Z170 Estrogen receptor positive status [ER+]: Secondary | ICD-10-CM

## 2018-05-18 DIAGNOSIS — C50919 Malignant neoplasm of unspecified site of unspecified female breast: Secondary | ICD-10-CM

## 2018-05-18 LAB — COMPREHENSIVE METABOLIC PANEL
ALT: 45 U/L — ABNORMAL HIGH (ref 0–44)
AST: 26 U/L (ref 15–41)
Albumin: 3.2 g/dL — ABNORMAL LOW (ref 3.5–5.0)
Alkaline Phosphatase: 149 U/L — ABNORMAL HIGH (ref 38–126)
Anion gap: 8 (ref 5–15)
BUN: 11 mg/dL (ref 8–23)
CO2: 33 mmol/L — ABNORMAL HIGH (ref 22–32)
Calcium: 9.5 mg/dL (ref 8.9–10.3)
Chloride: 99 mmol/L (ref 98–111)
Creatinine, Ser: 0.79 mg/dL (ref 0.44–1.00)
GFR calc Af Amer: >60 mL/min (ref >60)
GFR calc non Af Amer: >60 mL/min (ref >60)
Glucose, Bld: 250 mg/dL — ABNORMAL HIGH (ref 70–99)
Potassium: 4.2 mmol/L (ref 3.5–5.1)
Sodium: 140 mmol/L (ref 135–145)
Total Bilirubin: 0.4 mg/dL (ref 0.3–1.2)
Total Protein: 7.6 g/dL (ref 6.5–8.1)

## 2018-05-18 LAB — CBC WITH DIFFERENTIAL/PLATELET
Basophils Absolute: 0 10*3/uL (ref 0.0–0.1)
Basophils Relative: 1 %
Eosinophils Absolute: 0.1 10*3/uL (ref 0.0–0.5)
Eosinophils Relative: 2 %
HCT: 44.2 % (ref 34.8–46.6)
Hemoglobin: 14.4 g/dL (ref 11.6–15.9)
Lymphocytes Relative: 30 %
Lymphs Abs: 1.8 10*3/uL (ref 0.9–3.3)
MCH: 27.8 pg (ref 25.1–34.0)
MCHC: 32.6 g/dL (ref 31.5–36.0)
MCV: 85.4 fL (ref 79.5–101.0)
Monocytes Absolute: 0.4 10*3/uL (ref 0.1–0.9)
Monocytes Relative: 6 %
Neutro Abs: 3.8 10*3/uL (ref 1.5–6.5)
Neutrophils Relative %: 61 %
Platelets: 223 10*3/uL (ref 145–400)
RBC: 5.17 MIL/uL (ref 3.70–5.45)
RDW: 15.3 % — ABNORMAL HIGH (ref 11.2–14.5)
WBC: 6.2 10*3/uL (ref 3.9–10.3)

## 2018-05-18 MED ORDER — ACETAMINOPHEN 325 MG PO TABS
650.0000 mg | ORAL_TABLET | Freq: Once | ORAL | Status: AC
  Administered 2018-05-18: 650 mg via ORAL

## 2018-05-18 MED ORDER — LORAZEPAM 2 MG/ML IJ SOLN
INTRAMUSCULAR | Status: AC
  Filled 2018-05-18: qty 1

## 2018-05-18 MED ORDER — SODIUM CHLORIDE 0.9 % IJ SOLN
10.0000 mL | INTRAMUSCULAR | Status: DC | PRN
  Administered 2018-05-18: 10 mL
  Filled 2018-05-18: qty 10

## 2018-05-18 MED ORDER — HEPARIN SOD (PORK) LOCK FLUSH 100 UNIT/ML IV SOLN
500.0000 [IU] | Freq: Once | INTRAVENOUS | Status: AC | PRN
  Administered 2018-05-18: 500 [IU]
  Filled 2018-05-18: qty 5

## 2018-05-18 MED ORDER — LORAZEPAM 2 MG/ML IJ SOLN
0.5000 mg | Freq: Once | INTRAMUSCULAR | Status: AC
  Administered 2018-05-18: 0.5 mg via INTRAVENOUS

## 2018-05-18 MED ORDER — DIPHENHYDRAMINE HCL 25 MG PO CAPS
25.0000 mg | ORAL_CAPSULE | Freq: Once | ORAL | Status: AC
  Administered 2018-05-18: 25 mg via ORAL

## 2018-05-18 MED ORDER — DIPHENHYDRAMINE HCL 25 MG PO CAPS
ORAL_CAPSULE | ORAL | Status: AC
  Filled 2018-05-18: qty 1

## 2018-05-18 MED ORDER — TRASTUZUMAB CHEMO 150 MG IV SOLR
900.0000 mg | Freq: Once | INTRAVENOUS | Status: AC
  Administered 2018-05-18: 900 mg via INTRAVENOUS
  Filled 2018-05-18: qty 42.86

## 2018-05-18 MED ORDER — ACETAMINOPHEN 325 MG PO TABS
ORAL_TABLET | ORAL | Status: AC
  Filled 2018-05-18: qty 2

## 2018-05-18 MED ORDER — LETROZOLE 2.5 MG PO TABS: 2.5000 mg | ORAL_TABLET | Freq: Every day | ORAL | 12 refills | Status: DC

## 2018-05-18 MED ORDER — SODIUM CHLORIDE 0.9 % IV SOLN
Freq: Once | INTRAVENOUS | Status: AC
  Administered 2018-05-18: 13:00:00 via INTRAVENOUS
  Filled 2018-05-18: qty 250

## 2018-05-18 NOTE — Telephone Encounter (Signed)
Scheduled appt per 10/1 sch message - sent reminder letter in the mail with appt date and time.

## 2018-05-18 NOTE — Patient Instructions (Signed)
[facility] Discharge Instructions for Patients Receiving Chemotherapy  Today you received the following chemotherapy agents Herceptin  To help prevent nausea and vomiting after your treatment, we encourage you to take your nausea medication as directed   If you develop nausea and vomiting that is not controlled by your nausea medication, call the clinic.   BELOW ARE SYMPTOMS THAT SHOULD BE REPORTED IMMEDIATELY:  *FEVER GREATER THAN 100.5 F  *CHILLS WITH OR WITHOUT FEVER  NAUSEA AND VOMITING THAT IS NOT CONTROLLED WITH YOUR NAUSEA MEDICATION  *UNUSUAL SHORTNESS OF BREATH  *UNUSUAL BRUISING OR BLEEDING  TENDERNESS IN MOUTH AND THROAT WITH OR WITHOUT PRESENCE OF ULCERS  *URINARY PROBLEMS  *BOWEL PROBLEMS  UNUSUAL RASH Items with * indicate a potential emergency and should be followed up as soon as possible.  Feel free to call the clinic should you have any questions or concerns. The clinic phone number is [phone].  Please show the CHEMO ALERT CARD at check-in to the Emergency Department and triage nurse.   

## 2018-05-18 NOTE — Telephone Encounter (Signed)
Gave avs and calendar ° °

## 2018-05-19 ENCOUNTER — Other Ambulatory Visit: Payer: Self-pay

## 2018-05-19 ENCOUNTER — Ambulatory Visit: Payer: Medicare Other | Admitting: Oncology

## 2018-05-19 ENCOUNTER — Ambulatory Visit: Payer: Medicare Other

## 2018-05-19 ENCOUNTER — Other Ambulatory Visit: Payer: Medicare Other

## 2018-05-19 LAB — CANCER ANTIGEN 27.29: CA 27.29: 16.8 U/mL (ref 0.0–38.6)

## 2018-05-19 MED ORDER — CHOLESTYRAMINE 4 G PO PACK: 4.0000 g | PACK | Freq: Three times a day (TID) | ORAL | 12 refills | Status: DC

## 2018-05-19 MED ORDER — LOPERAMIDE HCL 2 MG PO CAPS: 2.0000 mg | ORAL_CAPSULE | ORAL | 0 refills | Status: DC | PRN

## 2018-05-19 NOTE — Progress Notes (Signed)
Pt called to request a antidiarrhea medication, since imodium has not helped relieve her symptoms. Per Dr.Magrinat, pt to take questran TID. [facility]. Pt verbalized understanding and thankful for the call.

## 2018-05-25 ENCOUNTER — Telehealth: Payer: Self-pay | Admitting: *Deleted

## 2018-05-25 MED ORDER — NITROFURANTOIN MACROCRYSTAL 100 MG PO CAPS: 100.0000 mg | ORAL_CAPSULE | Freq: Two times a day (BID) | ORAL | 0 refills | Status: DC

## 2018-05-25 NOTE — Telephone Encounter (Signed)
Pt left VM stating onset of UTI symptoms and request for medication.  Reviewed with MD- prescription obtained, called pt and verified pharmacy - prescription sent.

## 2018-05-31 ENCOUNTER — Telehealth: Payer: Self-pay

## 2018-05-31 DIAGNOSIS — I5020 Unspecified systolic (congestive) heart failure: Secondary | ICD-10-CM | POA: Diagnosis not present

## 2018-05-31 NOTE — Telephone Encounter (Signed)
Patient left message asking for lice treatment to be sent into pharmacy for her and for two others that will come on separate encounters.  [facility]  Call back is [phone]  Danley Danker, RN [facility] RN)

## 2018-06-01 MED ORDER — PERMETHRIN 1 % EX LIQD: Freq: Once | CUTANEOUS | 0 refills | Status: AC

## 2018-06-01 NOTE — Telephone Encounter (Signed)
Called her and reviewed Will send in for each of them She has an appointment in November

## 2018-06-10 ENCOUNTER — Other Ambulatory Visit: Payer: Self-pay | Admitting: *Deleted

## 2018-06-10 MED ORDER — NITROFURANTOIN MACROCRYSTAL 100 MG PO CAPS: 100.0000 mg | ORAL_CAPSULE | Freq: Every day | ORAL | 0 refills | Status: DC

## 2018-06-11 ENCOUNTER — Other Ambulatory Visit: Payer: Self-pay | Admitting: Family Medicine

## 2018-06-14 ENCOUNTER — Other Ambulatory Visit: Payer: Self-pay | Admitting: Oncology

## 2018-06-14 DIAGNOSIS — Z170 Estrogen receptor positive status [ER+]: Secondary | ICD-10-CM

## 2018-06-14 DIAGNOSIS — C50212 Malignant neoplasm of upper-inner quadrant of left female breast: Secondary | ICD-10-CM

## 2018-06-15 ENCOUNTER — Telehealth: Payer: Self-pay | Admitting: Oncology

## 2018-06-15 ENCOUNTER — Encounter: Payer: Self-pay | Admitting: *Deleted

## 2018-06-15 NOTE — Telephone Encounter (Signed)
Scheduled appt for ECHO per sch message - pt is aware of appt -

## 2018-06-15 NOTE — Progress Notes (Signed)
[place] [employer]  Holiday representative received phone call from patient requesting assistance with transportation.  Patient stated she has transportation to her treatment tomorrow, but is having difficulty finding transportation for her upcoming appointments.  Because patient lives in [place] transportation resources are limited.  CSW provided patient with phone number to [facility] [facility] [employer] transportation program, and patient plans to call today.  CSW will also meet with patient during her treatment tomorrow.    Johnnye Lana, MSW, LCSW, OSW-C Clinical Social Worker [facility] [phone]

## 2018-06-16 ENCOUNTER — Telehealth: Payer: Self-pay | Admitting: General Practice

## 2018-06-16 ENCOUNTER — Inpatient Hospital Stay: Payer: Medicare Other

## 2018-06-16 VITALS — BP 153/59 | HR 53 | Temp 98.3°F | Resp 14

## 2018-06-16 DIAGNOSIS — Z79899 Other long term (current) drug therapy: Secondary | ICD-10-CM | POA: Diagnosis not present

## 2018-06-16 DIAGNOSIS — C50212 Malignant neoplasm of upper-inner quadrant of left female breast: Secondary | ICD-10-CM

## 2018-06-16 DIAGNOSIS — Z5112 Encounter for antineoplastic immunotherapy: Secondary | ICD-10-CM | POA: Diagnosis not present

## 2018-06-16 DIAGNOSIS — Z170 Estrogen receptor positive status [ER+]: Secondary | ICD-10-CM

## 2018-06-16 DIAGNOSIS — C7800 Secondary malignant neoplasm of unspecified lung: Secondary | ICD-10-CM

## 2018-06-16 DIAGNOSIS — M549 Dorsalgia, unspecified: Secondary | ICD-10-CM | POA: Diagnosis not present

## 2018-06-16 DIAGNOSIS — Z171 Estrogen receptor negative status [ER-]: Secondary | ICD-10-CM | POA: Diagnosis not present

## 2018-06-16 DIAGNOSIS — R079 Chest pain, unspecified: Secondary | ICD-10-CM | POA: Diagnosis not present

## 2018-06-16 DIAGNOSIS — Z79811 Long term (current) use of aromatase inhibitors: Secondary | ICD-10-CM | POA: Diagnosis not present

## 2018-06-16 DIAGNOSIS — C50919 Malignant neoplasm of unspecified site of unspecified female breast: Secondary | ICD-10-CM

## 2018-06-16 DIAGNOSIS — Z86718 Personal history of other venous thrombosis and embolism: Secondary | ICD-10-CM | POA: Diagnosis not present

## 2018-06-16 LAB — COMPREHENSIVE METABOLIC PANEL
ALT: 45 U/L — ABNORMAL HIGH (ref 0–44)
AST: 29 U/L (ref 15–41)
Albumin: 3.3 g/dL — ABNORMAL LOW (ref 3.5–5.0)
Alkaline Phosphatase: 112 U/L (ref 38–126)
Anion gap: 10 (ref 5–15)
BUN: 13 mg/dL (ref 8–23)
CO2: 30 mmol/L (ref 22–32)
Calcium: 9.5 mg/dL (ref 8.9–10.3)
Chloride: 102 mmol/L (ref 98–111)
Creatinine, Ser: 0.83 mg/dL (ref 0.44–1.00)
GFR calc Af Amer: >60 mL/min (ref >60)
GFR calc non Af Amer: >60 mL/min (ref >60)
Glucose, Bld: 219 mg/dL — ABNORMAL HIGH (ref 70–99)
Potassium: 4.2 mmol/L (ref 3.5–5.1)
Sodium: 142 mmol/L (ref 135–145)
Total Bilirubin: 0.4 mg/dL (ref 0.3–1.2)
Total Protein: 7.5 g/dL (ref 6.5–8.1)

## 2018-06-16 LAB — CBC WITH DIFFERENTIAL/PLATELET
Abs Immature Granulocytes: 0.02 10*3/uL (ref 0.00–0.07)
Basophils Absolute: 0 10*3/uL (ref 0.0–0.1)
Basophils Relative: 0 %
Eosinophils Absolute: 0.2 10*3/uL (ref 0.0–0.5)
Eosinophils Relative: 2 %
HCT: 44.6 % (ref 36.0–46.0)
Hemoglobin: 14.4 g/dL (ref 12.0–15.0)
Immature Granulocytes: 0 %
Lymphocytes Relative: 33 %
Lymphs Abs: 2.1 10*3/uL (ref 0.7–4.0)
MCH: 27.8 pg (ref 26.0–34.0)
MCHC: 32.3 g/dL (ref 30.0–36.0)
MCV: 86.1 fL (ref 80.0–100.0)
Monocytes Absolute: 0.4 10*3/uL (ref 0.1–1.0)
Monocytes Relative: 6 %
Neutro Abs: 3.8 10*3/uL (ref 1.7–7.7)
Neutrophils Relative %: 59 %
Platelets: 223 10*3/uL (ref 150–400)
RBC: 5.18 MIL/uL — ABNORMAL HIGH (ref 3.87–5.11)
RDW: 14.7 % (ref 11.5–15.5)
WBC: 6.5 10*3/uL (ref 4.0–10.5)
nRBC: 0 % (ref 0.0–0.2)

## 2018-06-16 MED ORDER — ACETAMINOPHEN 325 MG PO TABS
ORAL_TABLET | ORAL | Status: AC
  Filled 2018-06-16: qty 2

## 2018-06-16 MED ORDER — TRASTUZUMAB CHEMO 150 MG IV SOLR
900.0000 mg | Freq: Once | INTRAVENOUS | Status: AC
  Administered 2018-06-16: 900 mg via INTRAVENOUS
  Filled 2018-06-16: qty 42.86

## 2018-06-16 MED ORDER — LORAZEPAM 2 MG/ML IJ SOLN
INTRAMUSCULAR | Status: AC
  Filled 2018-06-16: qty 1

## 2018-06-16 MED ORDER — HEPARIN SOD (PORK) LOCK FLUSH 100 UNIT/ML IV SOLN
500.0000 [IU] | Freq: Once | INTRAVENOUS | Status: AC | PRN
  Administered 2018-06-16: 500 [IU]
  Filled 2018-06-16: qty 5

## 2018-06-16 MED ORDER — SODIUM CHLORIDE 0.9 % IV SOLN
Freq: Once | INTRAVENOUS | Status: AC
  Administered 2018-06-16: 10:00:00 via INTRAVENOUS
  Filled 2018-06-16: qty 250

## 2018-06-16 MED ORDER — DIPHENHYDRAMINE HCL 25 MG PO CAPS
ORAL_CAPSULE | ORAL | Status: AC
  Filled 2018-06-16: qty 1

## 2018-06-16 MED ORDER — LORAZEPAM 2 MG/ML IJ SOLN
0.5000 mg | Freq: Once | INTRAMUSCULAR | Status: AC
  Administered 2018-06-16: 0.5 mg via INTRAVENOUS

## 2018-06-16 MED ORDER — DIPHENHYDRAMINE HCL 25 MG PO CAPS
25.0000 mg | ORAL_CAPSULE | Freq: Once | ORAL | Status: AC
  Administered 2018-06-16: 25 mg via ORAL

## 2018-06-16 MED ORDER — SODIUM CHLORIDE 0.9 % IJ SOLN
10.0000 mL | INTRAMUSCULAR | Status: DC | PRN
  Administered 2018-06-16: 10 mL
  Filled 2018-06-16: qty 10

## 2018-06-16 MED ORDER — ACETAMINOPHEN 325 MG PO TABS
650.0000 mg | ORAL_TABLET | Freq: Once | ORAL | Status: AC
  Administered 2018-06-16: 650 mg via ORAL

## 2018-06-16 NOTE — Telephone Encounter (Signed)
[place] CSW Progress Note  Call from patient, needs help w transportation to medical appointments.  CSW Dalene Seltzer has already referred to [facility].  Undersigned mailed application for [employer] - left VM for patient encouraging her to access this service by completing and submitting application.  Rides cost $2.50 each way.  Edwyna Shell, LCSW Clinical Social Worker Phone:  [phone]

## 2018-06-16 NOTE — Patient Instructions (Signed)
[facility] Discharge Instructions for Patients Receiving Chemotherapy  Today you received the following chemotherapy agents Herceptin  To help prevent nausea and vomiting after your treatment, we encourage you to take your nausea medication as directed   If you develop nausea and vomiting that is not controlled by your nausea medication, call the clinic.   BELOW ARE SYMPTOMS THAT SHOULD BE REPORTED IMMEDIATELY:  *FEVER GREATER THAN 100.5 F  *CHILLS WITH OR WITHOUT FEVER  NAUSEA AND VOMITING THAT IS NOT CONTROLLED WITH YOUR NAUSEA MEDICATION  *UNUSUAL SHORTNESS OF BREATH  *UNUSUAL BRUISING OR BLEEDING  TENDERNESS IN MOUTH AND THROAT WITH OR WITHOUT PRESENCE OF ULCERS  *URINARY PROBLEMS  *BOWEL PROBLEMS  UNUSUAL RASH Items with * indicate a potential emergency and should be followed up as soon as possible.  Feel free to call the clinic should you have any questions or concerns. The clinic phone number is [phone].  Please show the CHEMO ALERT CARD at check-in to the Emergency Department and triage nurse.   

## 2018-06-17 LAB — CANCER ANTIGEN 27.29: CA 27.29: 18.1 U/mL (ref 0.0–38.6)

## 2018-06-22 ENCOUNTER — Other Ambulatory Visit: Payer: Self-pay

## 2018-06-22 ENCOUNTER — Ambulatory Visit (INDEPENDENT_AMBULATORY_CARE_PROVIDER_SITE_OTHER): Payer: Medicare Other | Admitting: Family Medicine

## 2018-06-22 ENCOUNTER — Other Ambulatory Visit (HOSPITAL_COMMUNITY): Payer: Medicare Other

## 2018-06-22 VITALS — BP 122/80 | HR 59 | Temp 97.9°F | Wt 320.8 lb

## 2018-06-22 DIAGNOSIS — M4710 Other spondylosis with myelopathy, site unspecified: Secondary | ICD-10-CM

## 2018-06-22 DIAGNOSIS — E1149 Type 2 diabetes mellitus with other diabetic neurological complication: Secondary | ICD-10-CM

## 2018-06-22 DIAGNOSIS — I10 Essential (primary) hypertension: Secondary | ICD-10-CM | POA: Diagnosis not present

## 2018-06-22 DIAGNOSIS — Z23 Encounter for immunization: Secondary | ICD-10-CM | POA: Diagnosis not present

## 2018-06-22 LAB — POCT GLYCOSYLATED HEMOGLOBIN (HGB A1C): HbA1c, POC (controlled diabetic range): 8 % — AB (ref 0.0–7.0)

## 2018-06-22 NOTE — Assessment & Plan Note (Signed)
BP Readings from Last 3 Encounters:  06/22/18 122/80  06/16/18 (!) 153/59  05/18/18 (!) 151/81   Is at goal today off medications.  Perhaps due to weight loss or CBD oil??  She does not want to take medications.  We agreed to monitor and discuss again if her blood pressure rises

## 2018-06-22 NOTE — Assessment & Plan Note (Signed)
Chronic.  She actually seemed to be in better spirits about this today.   Will investigate a power wheelchair but do not think she would qualify or that it would be good for her.  Encouraged to continue to increase activity and weight loss

## 2018-06-22 NOTE — Assessment & Plan Note (Signed)
Fair control given her comorbidities.  She is not interested in changing any of her medications.  Will follow

## 2018-06-22 NOTE — Progress Notes (Signed)
Subjective  [name] is a 67 y.o. female is presenting with the following  HYPERTENSION Disease Monitoring: Blood pressure range-not checking Chest pain, palpitations- no      Dyspnea- no more than usual Medications: Compliance- stopped all her medications several weeks ago.  Feels like she would rather die of a heart attach than cancer.  Feels content with this decision Lightheadedness,Syncope- no   Edema- no  DIABETES Disease Monitoring: Blood Sugar ranges-not checking Polyuria/phagia/dipsia- no      Visual problems- no Medications: Compliance- continues to take insulin short acting three times a day "most of the time" and Lantus (rarely misses) Hypoglycemic symptoms- no  CHRONIC BACK PAIN This is her biggest concern.  She understands that chronic narcotics are not good care and did not ask for them.  She was schedulded to see Neurosurgery by oncology but missed the appointment.  Plans to reschedule sometime.  Has been working on diet and has lost weight.  Also using CBD oil and increased her walking from 22 to 36 steps.  Is interested in a power wheel chair   STRESS Her 34 yo granddtr whom she has custody is having suicidal ideation and auditory hallucinations and was seen for these today  Monitoring Labs and Parameters Last A1C:  Lab Results  Component Value Date   HGBA1C 8.0 (A) 06/22/2018    Last Lipid:     Component Value Date/Time   CHOL 140 10/27/2012 1653   HDL 56 10/27/2012 1653    Last Bmet  Potassium  Date Value Ref Range Status  06/16/2018 4.2 3.5 - 5.1 mmol/L Final  08/05/2017 4.0 3.5 - 5.1 mEq/L Final   Sodium  Date Value Ref Range Status  06/16/2018 142 135 - 145 mmol/L Final  08/05/2017 141 136 - 145 mEq/L Final   Creatinine  Date Value Ref Range Status  08/05/2017 0.8 0.6 - 1.1 mg/dL Final   Creatinine, Ser  Date Value Ref Range Status  06/16/2018 0.83 0.44 - 1.00 mg/dL Final      Last BPs:  BP Readings from Last 3 Encounters:  06/22/18  122/80  06/16/18 (!) 153/59  05/18/18 (!) 151/81     Chief Complaint noted Review of Symptoms - see HPI PMH - Smoking status noted.    Objective Vital Signs reviewed BP 122/80   Pulse (!) 59   Temp 97.9 F (36.6 C) (Oral)   Wt (!) 320 lb 12.8 oz (145.5 kg)   SpO2 96%   BMI 53.38 kg/m  Heart - Regular rate and rhythm.  No murmurs, gallops or rubs.    Lungs:  Normal respiratory effort, chest expands symmetrically. Lungs are clear to auscultation, no crackles or wheezes. No pitting edema  Assessments/Plans  See after visit summary for details of patient instuctions  HYPERTENSION, BENIGN SYSTEMIC BP Readings from Last 3 Encounters:  06/22/18 122/80  06/16/18 (!) 153/59  05/18/18 (!) 151/81   Is at goal today off medications.  Perhaps due to weight loss or CBD oil??  She does not want to take medications.  We agreed to monitor and discuss again if her blood pressure rises   Diabetes mellitus type 2 with neurological manifestations ([place]) Fair control given her comorbidities.  She is not interested in changing any of her medications.  Will follow   Obesity, morbid, BMI 50 or higher (HCC) Improving with soup and vegetable diet   Osteoarthritis of spine Chronic.  She actually seemed to be in better spirits about this today.  Will investigate a power wheelchair but do not think she would qualify or that it would be good for her.  Encouraged to continue to increase activity and weight loss

## 2018-06-22 NOTE — Patient Instructions (Signed)
Good to see you today!  Thanks for coming in.  Congrats on your weight loss and diabetes and blood pressure control  Send me information on the oxygenator and I will write a Rx  I will look into a power wheelchair  Keep taking the insulin as you are  Check your blood pressure at home  Come back 3 months  You will follow up with Neurosurgery

## 2018-06-22 NOTE — Assessment & Plan Note (Signed)
Improving with soup and vegetable diet

## 2018-06-28 ENCOUNTER — Ambulatory Visit (HOSPITAL_COMMUNITY)
Admission: RE | Admit: 2018-06-28 | Discharge: 2018-06-28 | Disposition: A | Discharge status: Home / Self Care | Payer: Medicare Other | Source: Ambulatory Visit | Attending: Oncology | Admitting: Oncology

## 2018-06-28 DIAGNOSIS — I071 Rheumatic tricuspid insufficiency: Secondary | ICD-10-CM | POA: Insufficient documentation

## 2018-06-28 DIAGNOSIS — E119 Type 2 diabetes mellitus without complications: Secondary | ICD-10-CM | POA: Diagnosis not present

## 2018-06-28 DIAGNOSIS — C50212 Malignant neoplasm of upper-inner quadrant of left female breast: Secondary | ICD-10-CM | POA: Diagnosis not present

## 2018-06-28 DIAGNOSIS — C7800 Secondary malignant neoplasm of unspecified lung: Secondary | ICD-10-CM | POA: Insufficient documentation

## 2018-06-28 DIAGNOSIS — Z6841 Body Mass Index (BMI) 40.0 and over, adult: Secondary | ICD-10-CM | POA: Diagnosis not present

## 2018-06-28 DIAGNOSIS — I10 Essential (primary) hypertension: Secondary | ICD-10-CM | POA: Insufficient documentation

## 2018-06-28 DIAGNOSIS — Z170 Estrogen receptor positive status [ER+]: Secondary | ICD-10-CM

## 2018-06-28 NOTE — Progress Notes (Signed)
  Echocardiogram 2D Echocardiogram has been performed.  [name] 06/28/2018, 3:01 PM

## 2018-07-01 DIAGNOSIS — I5020 Unspecified systolic (congestive) heart failure: Secondary | ICD-10-CM | POA: Diagnosis not present

## 2018-07-13 ENCOUNTER — Other Ambulatory Visit: Payer: Self-pay | Admitting: Family Medicine

## 2018-07-14 ENCOUNTER — Other Ambulatory Visit: Payer: Self-pay

## 2018-07-14 ENCOUNTER — Inpatient Hospital Stay: Payer: Medicare Other

## 2018-07-14 ENCOUNTER — Inpatient Hospital Stay: Payer: Medicare Other | Attending: Oncology

## 2018-07-14 VITALS — BP 156/73 | HR 71 | Temp 98.7°F | Resp 16

## 2018-07-14 DIAGNOSIS — C50212 Malignant neoplasm of upper-inner quadrant of left female breast: Secondary | ICD-10-CM | POA: Insufficient documentation

## 2018-07-14 DIAGNOSIS — C7800 Secondary malignant neoplasm of unspecified lung: Secondary | ICD-10-CM | POA: Diagnosis not present

## 2018-07-14 DIAGNOSIS — Z170 Estrogen receptor positive status [ER+]: Secondary | ICD-10-CM | POA: Insufficient documentation

## 2018-07-14 DIAGNOSIS — Z5112 Encounter for antineoplastic immunotherapy: Secondary | ICD-10-CM | POA: Diagnosis not present

## 2018-07-14 DIAGNOSIS — Z79811 Long term (current) use of aromatase inhibitors: Secondary | ICD-10-CM | POA: Insufficient documentation

## 2018-07-14 DIAGNOSIS — C50919 Malignant neoplasm of unspecified site of unspecified female breast: Secondary | ICD-10-CM

## 2018-07-14 LAB — COMPREHENSIVE METABOLIC PANEL
ALT: 53 U/L — ABNORMAL HIGH (ref 0–44)
AST: 27 U/L (ref 15–41)
Albumin: 3.2 g/dL — ABNORMAL LOW (ref 3.5–5.0)
Alkaline Phosphatase: 130 U/L — ABNORMAL HIGH (ref 38–126)
Anion gap: 9 (ref 5–15)
BUN: 12 mg/dL (ref 8–23)
CO2: 32 mmol/L (ref 22–32)
Calcium: 9.7 mg/dL (ref 8.9–10.3)
Chloride: 102 mmol/L (ref 98–111)
Creatinine, Ser: 0.78 mg/dL (ref 0.44–1.00)
GFR calc Af Amer: >60 mL/min (ref >60)
GFR calc non Af Amer: >60 mL/min (ref >60)
Glucose, Bld: 198 mg/dL — ABNORMAL HIGH (ref 70–99)
Potassium: 4.5 mmol/L (ref 3.5–5.1)
Sodium: 143 mmol/L (ref 135–145)
Total Bilirubin: 0.3 mg/dL (ref 0.3–1.2)
Total Protein: 7.4 g/dL (ref 6.5–8.1)

## 2018-07-14 LAB — CBC WITH DIFFERENTIAL/PLATELET
Abs Immature Granulocytes: 0.03 10*3/uL (ref 0.00–0.07)
Basophils Absolute: 0 10*3/uL (ref 0.0–0.1)
Basophils Relative: 0 %
Eosinophils Absolute: 0.1 10*3/uL (ref 0.0–0.5)
Eosinophils Relative: 2 %
HCT: 45.5 % (ref 36.0–46.0)
Hemoglobin: 14.6 g/dL (ref 12.0–15.0)
Immature Granulocytes: 0 %
Lymphocytes Relative: 28 %
Lymphs Abs: 1.9 10*3/uL (ref 0.7–4.0)
MCH: 27.9 pg (ref 26.0–34.0)
MCHC: 32.1 g/dL (ref 30.0–36.0)
MCV: 87 fL (ref 80.0–100.0)
Monocytes Absolute: 0.4 10*3/uL (ref 0.1–1.0)
Monocytes Relative: 6 %
Neutro Abs: 4.3 10*3/uL (ref 1.7–7.7)
Neutrophils Relative %: 64 %
Platelets: 227 10*3/uL (ref 150–400)
RBC: 5.23 MIL/uL — ABNORMAL HIGH (ref 3.87–5.11)
RDW: 14.4 % (ref 11.5–15.5)
WBC: 6.7 10*3/uL (ref 4.0–10.5)
nRBC: 0 % (ref 0.0–0.2)

## 2018-07-14 MED ORDER — SODIUM CHLORIDE 0.9 % IV SOLN
Freq: Once | INTRAVENOUS | Status: AC
  Administered 2018-07-14: 10:00:00 via INTRAVENOUS
  Filled 2018-07-14: qty 250

## 2018-07-14 MED ORDER — LOPERAMIDE HCL 2 MG PO CAPS: 2.0000 mg | ORAL_CAPSULE | ORAL | 0 refills | Status: DC | PRN

## 2018-07-14 MED ORDER — DIPHENHYDRAMINE HCL 25 MG PO CAPS
25.0000 mg | ORAL_CAPSULE | Freq: Once | ORAL | Status: AC
  Administered 2018-07-14: 25 mg via ORAL

## 2018-07-14 MED ORDER — ACETAMINOPHEN 325 MG PO TABS
650.0000 mg | ORAL_TABLET | Freq: Once | ORAL | Status: AC
  Administered 2018-07-14: 650 mg via ORAL

## 2018-07-14 MED ORDER — TRASTUZUMAB CHEMO 150 MG IV SOLR
900.0000 mg | Freq: Once | INTRAVENOUS | Status: AC
  Administered 2018-07-14: 900 mg via INTRAVENOUS
  Filled 2018-07-14: qty 42.86

## 2018-07-14 MED ORDER — HEPARIN SOD (PORK) LOCK FLUSH 100 UNIT/ML IV SOLN
500.0000 [IU] | Freq: Once | INTRAVENOUS | Status: AC | PRN
  Administered 2018-07-14: 500 [IU]
  Filled 2018-07-14: qty 5

## 2018-07-14 MED ORDER — DIPHENHYDRAMINE HCL 25 MG PO CAPS
ORAL_CAPSULE | ORAL | Status: AC
  Filled 2018-07-14: qty 1

## 2018-07-14 MED ORDER — LORAZEPAM 2 MG/ML IJ SOLN
INTRAMUSCULAR | Status: AC
  Filled 2018-07-14: qty 1

## 2018-07-14 MED ORDER — ACETAMINOPHEN 325 MG PO TABS
ORAL_TABLET | ORAL | Status: AC
  Filled 2018-07-14: qty 2

## 2018-07-14 MED ORDER — SODIUM CHLORIDE 0.9% FLUSH
10.0000 mL | INTRAVENOUS | Status: DC | PRN
  Administered 2018-07-14: 10 mL
  Filled 2018-07-14: qty 10

## 2018-07-14 MED ORDER — LORAZEPAM 2 MG/ML IJ SOLN
0.5000 mg | Freq: Once | INTRAMUSCULAR | Status: AC
  Administered 2018-07-14: 0.5 mg via INTRAVENOUS

## 2018-07-14 NOTE — Patient Instructions (Signed)
[facility] Discharge Instructions for Patients Receiving Chemotherapy  Today you received the following chemotherapy agents Herceptin  To help prevent nausea and vomiting after your treatment, we encourage you to take your nausea medication as directed   If you develop nausea and vomiting that is not controlled by your nausea medication, call the clinic.   BELOW ARE SYMPTOMS THAT SHOULD BE REPORTED IMMEDIATELY:  *FEVER GREATER THAN 100.5 F  *CHILLS WITH OR WITHOUT FEVER  NAUSEA AND VOMITING THAT IS NOT CONTROLLED WITH YOUR NAUSEA MEDICATION  *UNUSUAL SHORTNESS OF BREATH  *UNUSUAL BRUISING OR BLEEDING  TENDERNESS IN MOUTH AND THROAT WITH OR WITHOUT PRESENCE OF ULCERS  *URINARY PROBLEMS  *BOWEL PROBLEMS  UNUSUAL RASH Items with * indicate a potential emergency and should be followed up as soon as possible.  Feel free to call the clinic should you have any questions or concerns. The clinic phone number is [phone].  Please show the CHEMO ALERT CARD at check-in to the Emergency Department and triage nurse.   

## 2018-07-15 LAB — CANCER ANTIGEN 27.29: CA 27.29: 19 U/mL (ref 0.0–38.6)

## 2018-07-30 ENCOUNTER — Telehealth: Payer: Self-pay | Admitting: Family Medicine

## 2018-07-30 NOTE — Telephone Encounter (Signed)
Patient is not interested in taking a statin for diabetes   Asked her to come in soon to review her medications and check on the diabetes .  She will make an appointment to see me in the spring but not before

## 2018-07-31 DIAGNOSIS — I5020 Unspecified systolic (congestive) heart failure: Secondary | ICD-10-CM | POA: Diagnosis not present

## 2018-08-10 ENCOUNTER — Inpatient Hospital Stay: Payer: Medicare Other | Attending: Oncology

## 2018-08-10 ENCOUNTER — Inpatient Hospital Stay: Payer: Medicare Other

## 2018-08-10 VITALS — BP 125/51 | HR 65 | Temp 98.0°F | Resp 20

## 2018-08-10 DIAGNOSIS — Z170 Estrogen receptor positive status [ER+]: Secondary | ICD-10-CM

## 2018-08-10 DIAGNOSIS — C50919 Malignant neoplasm of unspecified site of unspecified female breast: Secondary | ICD-10-CM

## 2018-08-10 DIAGNOSIS — Z171 Estrogen receptor negative status [ER-]: Secondary | ICD-10-CM | POA: Insufficient documentation

## 2018-08-10 DIAGNOSIS — Z5112 Encounter for antineoplastic immunotherapy: Secondary | ICD-10-CM | POA: Insufficient documentation

## 2018-08-10 DIAGNOSIS — C7800 Secondary malignant neoplasm of unspecified lung: Secondary | ICD-10-CM | POA: Diagnosis not present

## 2018-08-10 DIAGNOSIS — C50212 Malignant neoplasm of upper-inner quadrant of left female breast: Secondary | ICD-10-CM | POA: Diagnosis not present

## 2018-08-10 DIAGNOSIS — Z79811 Long term (current) use of aromatase inhibitors: Secondary | ICD-10-CM | POA: Diagnosis not present

## 2018-08-10 DIAGNOSIS — Z95828 Presence of other vascular implants and grafts: Secondary | ICD-10-CM

## 2018-08-10 LAB — COMPREHENSIVE METABOLIC PANEL
ALT: 59 U/L — ABNORMAL HIGH (ref 0–44)
AST: 26 U/L (ref 15–41)
Albumin: 3.1 g/dL — ABNORMAL LOW (ref 3.5–5.0)
Alkaline Phosphatase: 127 U/L — ABNORMAL HIGH (ref 38–126)
Anion gap: 8 (ref 5–15)
BUN: 17 mg/dL (ref 8–23)
CO2: 33 mmol/L — ABNORMAL HIGH (ref 22–32)
Calcium: 9.4 mg/dL (ref 8.9–10.3)
Chloride: 101 mmol/L (ref 98–111)
Creatinine, Ser: 0.84 mg/dL (ref 0.44–1.00)
GFR calc Af Amer: >60 mL/min (ref >60)
GFR calc non Af Amer: >60 mL/min (ref >60)
Glucose, Bld: 246 mg/dL — ABNORMAL HIGH (ref 70–99)
Potassium: 4.5 mmol/L (ref 3.5–5.1)
Sodium: 142 mmol/L (ref 135–145)
Total Bilirubin: 0.3 mg/dL (ref 0.3–1.2)
Total Protein: 7.2 g/dL (ref 6.5–8.1)

## 2018-08-10 LAB — CBC WITH DIFFERENTIAL/PLATELET
Abs Immature Granulocytes: 0.02 10*3/uL (ref 0.00–0.07)
Basophils Absolute: 0 10*3/uL (ref 0.0–0.1)
Basophils Relative: 0 %
Eosinophils Absolute: 0.1 10*3/uL (ref 0.0–0.5)
Eosinophils Relative: 2 %
HCT: 43.9 % (ref 36.0–46.0)
Hemoglobin: 14 g/dL (ref 12.0–15.0)
Immature Granulocytes: 0 %
Lymphocytes Relative: 30 %
Lymphs Abs: 1.8 10*3/uL (ref 0.7–4.0)
MCH: 28.1 pg (ref 26.0–34.0)
MCHC: 31.9 g/dL (ref 30.0–36.0)
MCV: 88 fL (ref 80.0–100.0)
Monocytes Absolute: 0.5 10*3/uL (ref 0.1–1.0)
Monocytes Relative: 8 %
Neutro Abs: 3.4 10*3/uL (ref 1.7–7.7)
Neutrophils Relative %: 60 %
Platelets: 214 10*3/uL (ref 150–400)
RBC: 4.99 MIL/uL (ref 3.87–5.11)
RDW: 14.4 % (ref 11.5–15.5)
WBC: 5.8 10*3/uL (ref 4.0–10.5)
nRBC: 0 % (ref 0.0–0.2)

## 2018-08-10 MED ORDER — LORAZEPAM 2 MG/ML IJ SOLN
0.5000 mg | Freq: Once | INTRAMUSCULAR | Status: AC
  Administered 2018-08-10: 0.5 mg via INTRAVENOUS

## 2018-08-10 MED ORDER — ACETAMINOPHEN 325 MG PO TABS
ORAL_TABLET | ORAL | Status: AC
  Filled 2018-08-10: qty 2

## 2018-08-10 MED ORDER — HEPARIN SOD (PORK) LOCK FLUSH 100 UNIT/ML IV SOLN
500.0000 [IU] | Freq: Once | INTRAVENOUS | Status: AC | PRN
  Administered 2018-08-10: 500 [IU]
  Filled 2018-08-10: qty 5

## 2018-08-10 MED ORDER — ACETAMINOPHEN 325 MG PO TABS
650.0000 mg | ORAL_TABLET | Freq: Once | ORAL | Status: AC
  Administered 2018-08-10: 650 mg via ORAL

## 2018-08-10 MED ORDER — LORAZEPAM 2 MG/ML IJ SOLN
INTRAMUSCULAR | Status: AC
  Filled 2018-08-10: qty 1

## 2018-08-10 MED ORDER — DIPHENHYDRAMINE HCL 25 MG PO CAPS
ORAL_CAPSULE | ORAL | Status: AC
  Filled 2018-08-10: qty 1

## 2018-08-10 MED ORDER — SODIUM CHLORIDE 0.9 % IV SOLN
Freq: Once | INTRAVENOUS | Status: AC
  Administered 2018-08-10: 10:00:00 via INTRAVENOUS
  Filled 2018-08-10: qty 250

## 2018-08-10 MED ORDER — DIPHENHYDRAMINE HCL 25 MG PO CAPS
25.0000 mg | ORAL_CAPSULE | Freq: Once | ORAL | Status: AC
  Administered 2018-08-10: 25 mg via ORAL

## 2018-08-10 MED ORDER — TRASTUZUMAB CHEMO 150 MG IV SOLR
900.0000 mg | Freq: Once | INTRAVENOUS | Status: AC
  Administered 2018-08-10: 900 mg via INTRAVENOUS
  Filled 2018-08-10: qty 42.86

## 2018-08-10 MED ORDER — SODIUM CHLORIDE 0.9% FLUSH
10.0000 mL | Freq: Once | INTRAVENOUS | Status: AC
  Administered 2018-08-10: 10 mL via INTRAVENOUS
  Filled 2018-08-10: qty 10

## 2018-08-10 MED ORDER — SODIUM CHLORIDE 0.9 % IJ SOLN
10.0000 mL | INTRAMUSCULAR | Status: DC | PRN
  Filled 2018-08-10: qty 10

## 2018-08-10 NOTE — Patient Instructions (Addendum)
[facility] Discharge Instructions for Patients Receiving Chemotherapy  Today you received the following chemotherapy agents: Trastuzumab (Herceptin)  To help prevent nausea and vomiting after your treatment, we encourage you to take your nausea medication as directed.    If you develop nausea and vomiting that is not controlled by your nausea medication, call the clinic.   BELOW ARE SYMPTOMS THAT SHOULD BE REPORTED IMMEDIATELY:  *FEVER GREATER THAN 100.5 F  *CHILLS WITH OR WITHOUT FEVER  NAUSEA AND VOMITING THAT IS NOT CONTROLLED WITH YOUR NAUSEA MEDICATION  *UNUSUAL SHORTNESS OF BREATH  *UNUSUAL BRUISING OR BLEEDING  TENDERNESS IN MOUTH AND THROAT WITH OR WITHOUT PRESENCE OF ULCERS  *URINARY PROBLEMS  *BOWEL PROBLEMS  UNUSUAL RASH Items with * indicate a potential emergency and should be followed up as soon as possible.  Feel free to call the clinic should you have any questions or concerns. The clinic phone number is [phone].  Please show the CHEMO ALERT CARD at check-in to the Emergency Department and triage nurse.    

## 2018-08-11 LAB — CANCER ANTIGEN 27.29: CA 27.29: 15.8 U/mL (ref 0.0–38.6)

## 2018-08-26 ENCOUNTER — Other Ambulatory Visit: Payer: Self-pay | Admitting: Oncology

## 2018-08-31 ENCOUNTER — Other Ambulatory Visit: Payer: Self-pay | Admitting: *Deleted

## 2018-08-31 ENCOUNTER — Ambulatory Visit: Payer: Medicare Other | Admitting: Adult Health

## 2018-08-31 DIAGNOSIS — I5020 Unspecified systolic (congestive) heart failure: Secondary | ICD-10-CM | POA: Diagnosis not present

## 2018-09-01 MED ORDER — GLUCOSE BLOOD VI STRP: ORAL_STRIP | 1 refills | Status: DC

## 2018-09-08 ENCOUNTER — Inpatient Hospital Stay: Payer: Medicare Other

## 2018-09-08 ENCOUNTER — Ambulatory Visit: Payer: Medicare Other

## 2018-09-08 ENCOUNTER — Inpatient Hospital Stay: Payer: Medicare Other | Attending: Oncology | Admitting: Adult Health

## 2018-09-08 ENCOUNTER — Other Ambulatory Visit: Payer: Medicare Other

## 2018-09-08 ENCOUNTER — Encounter: Payer: Self-pay | Admitting: Adult Health

## 2018-09-08 ENCOUNTER — Telehealth: Payer: Self-pay | Admitting: Oncology

## 2018-09-08 VITALS — BP 154/69 | HR 60 | Temp 97.9°F | Resp 18 | Ht 65.0 in | Wt 317.2 lb

## 2018-09-08 DIAGNOSIS — Z794 Long term (current) use of insulin: Secondary | ICD-10-CM | POA: Insufficient documentation

## 2018-09-08 DIAGNOSIS — M549 Dorsalgia, unspecified: Secondary | ICD-10-CM | POA: Insufficient documentation

## 2018-09-08 DIAGNOSIS — C50212 Malignant neoplasm of upper-inner quadrant of left female breast: Secondary | ICD-10-CM | POA: Insufficient documentation

## 2018-09-08 DIAGNOSIS — C50919 Malignant neoplasm of unspecified site of unspecified female breast: Secondary | ICD-10-CM

## 2018-09-08 DIAGNOSIS — C7800 Secondary malignant neoplasm of unspecified lung: Secondary | ICD-10-CM

## 2018-09-08 DIAGNOSIS — M1990 Unspecified osteoarthritis, unspecified site: Secondary | ICD-10-CM

## 2018-09-08 DIAGNOSIS — Z803 Family history of malignant neoplasm of breast: Secondary | ICD-10-CM | POA: Diagnosis not present

## 2018-09-08 DIAGNOSIS — Z79899 Other long term (current) drug therapy: Secondary | ICD-10-CM | POA: Insufficient documentation

## 2018-09-08 DIAGNOSIS — Z170 Estrogen receptor positive status [ER+]: Secondary | ICD-10-CM | POA: Insufficient documentation

## 2018-09-08 DIAGNOSIS — Z7982 Long term (current) use of aspirin: Secondary | ICD-10-CM | POA: Diagnosis not present

## 2018-09-08 DIAGNOSIS — E119 Type 2 diabetes mellitus without complications: Secondary | ICD-10-CM | POA: Insufficient documentation

## 2018-09-08 DIAGNOSIS — I10 Essential (primary) hypertension: Secondary | ICD-10-CM | POA: Diagnosis not present

## 2018-09-08 DIAGNOSIS — Z87891 Personal history of nicotine dependence: Secondary | ICD-10-CM | POA: Diagnosis not present

## 2018-09-08 DIAGNOSIS — Z79811 Long term (current) use of aromatase inhibitors: Secondary | ICD-10-CM | POA: Insufficient documentation

## 2018-09-08 DIAGNOSIS — Z5112 Encounter for antineoplastic immunotherapy: Secondary | ICD-10-CM | POA: Diagnosis not present

## 2018-09-08 DIAGNOSIS — Z9981 Dependence on supplemental oxygen: Secondary | ICD-10-CM | POA: Diagnosis not present

## 2018-09-08 DIAGNOSIS — Z8041 Family history of malignant neoplasm of ovary: Secondary | ICD-10-CM | POA: Insufficient documentation

## 2018-09-08 LAB — CBC WITH DIFFERENTIAL/PLATELET
Abs Immature Granulocytes: 0.01 10*3/uL (ref 0.00–0.07)
Basophils Absolute: 0 10*3/uL (ref 0.0–0.1)
Basophils Relative: 0 %
Eosinophils Absolute: 0.1 10*3/uL (ref 0.0–0.5)
Eosinophils Relative: 1 %
HCT: 42.6 % (ref 36.0–46.0)
Hemoglobin: 13.6 g/dL (ref 12.0–15.0)
Immature Granulocytes: 0 %
Lymphocytes Relative: 31 %
Lymphs Abs: 1.9 10*3/uL (ref 0.7–4.0)
MCH: 28.3 pg (ref 26.0–34.0)
MCHC: 31.9 g/dL (ref 30.0–36.0)
MCV: 88.6 fL (ref 80.0–100.0)
Monocytes Absolute: 0.4 10*3/uL (ref 0.1–1.0)
Monocytes Relative: 6 %
Neutro Abs: 3.7 10*3/uL (ref 1.7–7.7)
Neutrophils Relative %: 62 %
Platelets: 210 10*3/uL (ref 150–400)
RBC: 4.81 MIL/uL (ref 3.87–5.11)
RDW: 13.9 % (ref 11.5–15.5)
WBC: 6.2 10*3/uL (ref 4.0–10.5)
nRBC: 0 % (ref 0.0–0.2)

## 2018-09-08 LAB — COMPREHENSIVE METABOLIC PANEL
ALT: 55 U/L — ABNORMAL HIGH (ref 0–44)
AST: 33 U/L (ref 15–41)
Albumin: 3.3 g/dL — ABNORMAL LOW (ref 3.5–5.0)
Alkaline Phosphatase: 131 U/L — ABNORMAL HIGH (ref 38–126)
Anion gap: 9 (ref 5–15)
BUN: 14 mg/dL (ref 8–23)
CO2: 32 mmol/L (ref 22–32)
Calcium: 9.6 mg/dL (ref 8.9–10.3)
Chloride: 101 mmol/L (ref 98–111)
Creatinine, Ser: 0.75 mg/dL (ref 0.44–1.00)
GFR calc Af Amer: >60 mL/min (ref >60)
GFR calc non Af Amer: >60 mL/min (ref >60)
Glucose, Bld: 141 mg/dL — ABNORMAL HIGH (ref 70–99)
Potassium: 4.3 mmol/L (ref 3.5–5.1)
Sodium: 142 mmol/L (ref 135–145)
Total Bilirubin: 0.4 mg/dL (ref 0.3–1.2)
Total Protein: 7.4 g/dL (ref 6.5–8.1)

## 2018-09-08 MED ORDER — SODIUM CHLORIDE 0.9 % IV SOLN
Freq: Once | INTRAVENOUS | Status: AC
  Administered 2018-09-08: 10:00:00 via INTRAVENOUS
  Filled 2018-09-08: qty 250

## 2018-09-08 MED ORDER — DIPHENHYDRAMINE HCL 25 MG PO CAPS
ORAL_CAPSULE | ORAL | Status: AC
  Filled 2018-09-08: qty 1

## 2018-09-08 MED ORDER — LORAZEPAM 2 MG/ML IJ SOLN
0.5000 mg | Freq: Once | INTRAMUSCULAR | Status: AC
  Administered 2018-09-08: 0.5 mg via INTRAVENOUS

## 2018-09-08 MED ORDER — HEPARIN SOD (PORK) LOCK FLUSH 100 UNIT/ML IV SOLN
500.0000 [IU] | Freq: Once | INTRAVENOUS | Status: AC | PRN
  Administered 2018-09-08: 500 [IU]
  Filled 2018-09-08: qty 5

## 2018-09-08 MED ORDER — LORAZEPAM 2 MG/ML IJ SOLN
INTRAMUSCULAR | Status: AC
  Filled 2018-09-08: qty 1

## 2018-09-08 MED ORDER — ACETAMINOPHEN 325 MG PO TABS
ORAL_TABLET | ORAL | Status: AC
  Filled 2018-09-08: qty 2

## 2018-09-08 MED ORDER — SODIUM CHLORIDE 0.9% FLUSH
10.0000 mL | INTRAVENOUS | Status: DC | PRN
  Administered 2018-09-08: 10 mL
  Filled 2018-09-08: qty 10

## 2018-09-08 MED ORDER — DIPHENHYDRAMINE HCL 25 MG PO CAPS
25.0000 mg | ORAL_CAPSULE | Freq: Once | ORAL | Status: AC
  Administered 2018-09-08: 25 mg via ORAL

## 2018-09-08 MED ORDER — TRASTUZUMAB CHEMO 150 MG IV SOLR
900.0000 mg | Freq: Once | INTRAVENOUS | Status: AC
  Administered 2018-09-08: 900 mg via INTRAVENOUS
  Filled 2018-09-08: qty 42.86

## 2018-09-08 MED ORDER — ACETAMINOPHEN 325 MG PO TABS
650.0000 mg | ORAL_TABLET | Freq: Once | ORAL | Status: AC
  Administered 2018-09-08: 650 mg via ORAL

## 2018-09-08 NOTE — Telephone Encounter (Signed)
Gave avs and calendar ° °

## 2018-09-08 NOTE — Patient Instructions (Signed)
[facility] Discharge Instructions for Patients Receiving Chemotherapy  Today you received the following chemotherapy agents Herceptin  To help prevent nausea and vomiting after your treatment, we encourage you to take your nausea medication as directed   If you develop nausea and vomiting that is not controlled by your nausea medication, call the clinic.   BELOW ARE SYMPTOMS THAT SHOULD BE REPORTED IMMEDIATELY:  *FEVER GREATER THAN 100.5 F  *CHILLS WITH OR WITHOUT FEVER  NAUSEA AND VOMITING THAT IS NOT CONTROLLED WITH YOUR NAUSEA MEDICATION  *UNUSUAL SHORTNESS OF BREATH  *UNUSUAL BRUISING OR BLEEDING  TENDERNESS IN MOUTH AND THROAT WITH OR WITHOUT PRESENCE OF ULCERS  *URINARY PROBLEMS  *BOWEL PROBLEMS  UNUSUAL RASH Items with * indicate a potential emergency and should be followed up as soon as possible.  Feel free to call the clinic should you have any questions or concerns. The clinic phone number is [phone].  Please show the CHEMO ALERT CARD at check-in to the Emergency Department and triage nurse.   
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
## 2018-09-09 LAB — CANCER ANTIGEN 27.29: CA 27.29: 18.6 U/mL (ref 0.0–38.6)

## 2018-09-14 ENCOUNTER — Ambulatory Visit (HOSPITAL_COMMUNITY): Payer: Medicare Other

## 2018-09-17 ENCOUNTER — Ambulatory Visit (HOSPITAL_COMMUNITY): Payer: Medicare Other

## 2018-09-24 ENCOUNTER — Ambulatory Visit (HOSPITAL_COMMUNITY): Payer: Medicare Other

## 2018-09-29 ENCOUNTER — Other Ambulatory Visit: Payer: Self-pay | Admitting: *Deleted

## 2018-09-29 MED ORDER — ALBUTEROL SULFATE HFA 108 (90 BASE) MCG/ACT IN AERS: INHALATION_SPRAY | RESPIRATORY_TRACT | 1 refills | Status: DC

## 2018-10-01 DIAGNOSIS — I5020 Unspecified systolic (congestive) heart failure: Secondary | ICD-10-CM | POA: Diagnosis not present

## 2018-10-06 ENCOUNTER — Inpatient Hospital Stay: Payer: Medicare Other | Attending: Oncology

## 2018-10-06 ENCOUNTER — Inpatient Hospital Stay: Payer: Medicare Other

## 2018-10-06 VITALS — BP 150/75 | HR 63 | Temp 98.6°F | Resp 20 | Wt 312.0 lb

## 2018-10-06 DIAGNOSIS — Z170 Estrogen receptor positive status [ER+]: Secondary | ICD-10-CM | POA: Insufficient documentation

## 2018-10-06 DIAGNOSIS — Z5112 Encounter for antineoplastic immunotherapy: Secondary | ICD-10-CM | POA: Diagnosis not present

## 2018-10-06 DIAGNOSIS — C50919 Malignant neoplasm of unspecified site of unspecified female breast: Secondary | ICD-10-CM

## 2018-10-06 DIAGNOSIS — C50212 Malignant neoplasm of upper-inner quadrant of left female breast: Secondary | ICD-10-CM

## 2018-10-06 DIAGNOSIS — Z79811 Long term (current) use of aromatase inhibitors: Secondary | ICD-10-CM | POA: Diagnosis not present

## 2018-10-06 DIAGNOSIS — C7800 Secondary malignant neoplasm of unspecified lung: Secondary | ICD-10-CM | POA: Insufficient documentation

## 2018-10-06 LAB — CBC WITH DIFFERENTIAL/PLATELET
Abs Immature Granulocytes: 0.03 10*3/uL (ref 0.00–0.07)
Basophils Absolute: 0 10*3/uL (ref 0.0–0.1)
Basophils Relative: 0 %
Eosinophils Absolute: 0.1 10*3/uL (ref 0.0–0.5)
Eosinophils Relative: 1 %
HCT: 43.1 % (ref 36.0–46.0)
Hemoglobin: 13.8 g/dL (ref 12.0–15.0)
Immature Granulocytes: 1 %
Lymphocytes Relative: 32 %
Lymphs Abs: 1.9 10*3/uL (ref 0.7–4.0)
MCH: 28.8 pg (ref 26.0–34.0)
MCHC: 32 g/dL (ref 30.0–36.0)
MCV: 90 fL (ref 80.0–100.0)
Monocytes Absolute: 0.4 10*3/uL (ref 0.1–1.0)
Monocytes Relative: 7 %
Neutro Abs: 3.6 10*3/uL (ref 1.7–7.7)
Neutrophils Relative %: 59 %
Platelets: 230 10*3/uL (ref 150–400)
RBC: 4.79 MIL/uL (ref 3.87–5.11)
RDW: 13.9 % (ref 11.5–15.5)
WBC: 6.1 10*3/uL (ref 4.0–10.5)
nRBC: 0 % (ref 0.0–0.2)

## 2018-10-06 LAB — COMPREHENSIVE METABOLIC PANEL
ALT: 42 U/L (ref 0–44)
AST: 22 U/L (ref 15–41)
Albumin: 3.3 g/dL — ABNORMAL LOW (ref 3.5–5.0)
Alkaline Phosphatase: 125 U/L (ref 38–126)
Anion gap: 9 (ref 5–15)
BUN: 15 mg/dL (ref 8–23)
CO2: 31 mmol/L (ref 22–32)
Calcium: 9.6 mg/dL (ref 8.9–10.3)
Chloride: 101 mmol/L (ref 98–111)
Creatinine, Ser: 0.83 mg/dL (ref 0.44–1.00)
GFR calc Af Amer: >60 mL/min (ref >60)
GFR calc non Af Amer: >60 mL/min (ref >60)
Glucose, Bld: 176 mg/dL — ABNORMAL HIGH (ref 70–99)
Potassium: 4.3 mmol/L (ref 3.5–5.1)
Sodium: 141 mmol/L (ref 135–145)
Total Bilirubin: 0.3 mg/dL (ref 0.3–1.2)
Total Protein: 7.2 g/dL (ref 6.5–8.1)

## 2018-10-06 MED ORDER — DIPHENHYDRAMINE HCL 25 MG PO CAPS
ORAL_CAPSULE | ORAL | Status: AC
  Filled 2018-10-06: qty 1

## 2018-10-06 MED ORDER — SODIUM CHLORIDE 0.9% FLUSH
10.0000 mL | INTRAVENOUS | Status: DC | PRN
  Administered 2018-10-06: 10 mL
  Filled 2018-10-06: qty 10

## 2018-10-06 MED ORDER — HEPARIN SOD (PORK) LOCK FLUSH 100 UNIT/ML IV SOLN
500.0000 [IU] | Freq: Once | INTRAVENOUS | Status: AC | PRN
  Administered 2018-10-06: 500 [IU]
  Filled 2018-10-06: qty 5

## 2018-10-06 MED ORDER — LORAZEPAM 2 MG/ML IJ SOLN
INTRAMUSCULAR | Status: AC
  Filled 2018-10-06: qty 1

## 2018-10-06 MED ORDER — ACETAMINOPHEN 325 MG PO TABS
650.0000 mg | ORAL_TABLET | Freq: Once | ORAL | Status: AC
  Administered 2018-10-06: 650 mg via ORAL

## 2018-10-06 MED ORDER — SODIUM CHLORIDE 0.9 % IV SOLN
Freq: Once | INTRAVENOUS | Status: AC
  Administered 2018-10-06: 10:00:00 via INTRAVENOUS
  Filled 2018-10-06: qty 250

## 2018-10-06 MED ORDER — ACETAMINOPHEN 325 MG PO TABS
ORAL_TABLET | ORAL | Status: AC
  Filled 2018-10-06: qty 2

## 2018-10-06 MED ORDER — SODIUM CHLORIDE 0.9 % IJ SOLN: 10.0000 mL | INTRAMUSCULAR | Status: DC | PRN

## 2018-10-06 MED ORDER — DIPHENHYDRAMINE HCL 25 MG PO CAPS
25.0000 mg | ORAL_CAPSULE | Freq: Once | ORAL | Status: AC
  Administered 2018-10-06: 25 mg via ORAL

## 2018-10-06 MED ORDER — LORAZEPAM 2 MG/ML IJ SOLN
0.5000 mg | Freq: Once | INTRAMUSCULAR | Status: AC
  Administered 2018-10-06: 0.5 mg via INTRAVENOUS

## 2018-10-06 MED ORDER — TRASTUZUMAB CHEMO 150 MG IV SOLR
900.0000 mg | Freq: Once | INTRAVENOUS | Status: AC
  Administered 2018-10-06: 900 mg via INTRAVENOUS
  Filled 2018-10-06: qty 42.86

## 2018-10-06 NOTE — Patient Instructions (Signed)
[facility] Discharge Instructions for Patients Receiving Chemotherapy  Today you received the following chemotherapy agents Trastuzumab (HERCEPTIN).  To help prevent nausea and vomiting after your treatment, we encourage you to take your nausea medication as prescribed.   If you develop nausea and vomiting that is not controlled by your nausea medication, call the clinic.   BELOW ARE SYMPTOMS THAT SHOULD BE REPORTED IMMEDIATELY:  *FEVER GREATER THAN 100.5 F  *CHILLS WITH OR WITHOUT FEVER  NAUSEA AND VOMITING THAT IS NOT CONTROLLED WITH YOUR NAUSEA MEDICATION  *UNUSUAL SHORTNESS OF BREATH  *UNUSUAL BRUISING OR BLEEDING  TENDERNESS IN MOUTH AND THROAT WITH OR WITHOUT PRESENCE OF ULCERS  *URINARY PROBLEMS  *BOWEL PROBLEMS  UNUSUAL RASH Items with * indicate a potential emergency and should be followed up as soon as possible.  Feel free to call the clinic should you have any questions or concerns. The clinic phone number is [phone].  Please show the [place] at check-in to the Emergency Department and triage nurse.

## 2018-10-07 LAB — CANCER ANTIGEN 27.29: CA 27.29: 18.3 U/mL (ref 0.0–38.6)

## 2018-10-11 ENCOUNTER — Telehealth: Payer: Self-pay

## 2018-10-11 NOTE — Telephone Encounter (Signed)
Pt called nurse line requesting for pcp to send in a walker order to [facility]. Pt does want the walker than also has a seat, so she can sit down "abrubtly" when the pain hits her. Please let her know when the order has been placed to [facility].

## 2018-10-12 NOTE — Telephone Encounter (Signed)
Please let patient know she will need a face to face visit with me in order to order the walker  Thanks  [place]

## 2018-10-12 NOTE — Telephone Encounter (Signed)
Called patient and she states that she will wait until October. Patient did not elaborate.  Called back to get clarity and no answer.  Ozella Almond, [place]

## 2018-10-15 ENCOUNTER — Ambulatory Visit (HOSPITAL_COMMUNITY)
Admission: RE | Admit: 2018-10-15 | Discharge: 2018-10-15 | Disposition: A | Discharge status: Home / Self Care | Payer: Medicare Other | Source: Ambulatory Visit | Attending: Oncology | Admitting: Oncology

## 2018-10-15 DIAGNOSIS — C50212 Malignant neoplasm of upper-inner quadrant of left female breast: Secondary | ICD-10-CM | POA: Insufficient documentation

## 2018-10-15 DIAGNOSIS — C50912 Malignant neoplasm of unspecified site of left female breast: Secondary | ICD-10-CM | POA: Diagnosis not present

## 2018-10-15 DIAGNOSIS — Z170 Estrogen receptor positive status [ER+]: Secondary | ICD-10-CM | POA: Diagnosis present

## 2018-10-15 MED ORDER — HEPARIN SOD (PORK) LOCK FLUSH 100 UNIT/ML IV SOLN
INTRAVENOUS | Status: AC
  Filled 2018-10-15: qty 5

## 2018-10-15 MED ORDER — SODIUM CHLORIDE (PF) 0.9 % IJ SOLN
INTRAMUSCULAR | Status: AC
  Filled 2018-10-15: qty 50

## 2018-10-15 MED ORDER — IOHEXOL 300 MG/ML  SOLN
100.0000 mL | Freq: Once | INTRAMUSCULAR | Status: AC | PRN
  Administered 2018-10-15: 100 mL via INTRAVENOUS

## 2018-10-29 ENCOUNTER — Other Ambulatory Visit: Payer: Self-pay | Admitting: Adult Health

## 2018-10-29 NOTE — Progress Notes (Signed)
Of note patient discussed at palliative care conference regarding her pain.  It was recommended to consider Cymbalta, and recommended for her to continue to exercise and work on weight loss.    Dr. Maryjean Ka willing to consult if needed.  [name], NP

## 2018-10-30 DIAGNOSIS — I5020 Unspecified systolic (congestive) heart failure: Secondary | ICD-10-CM | POA: Diagnosis not present

## 2018-11-02 ENCOUNTER — Other Ambulatory Visit: Payer: Self-pay | Admitting: Oncology

## 2018-11-03 ENCOUNTER — Other Ambulatory Visit: Payer: Self-pay

## 2018-11-03 ENCOUNTER — Inpatient Hospital Stay: Payer: Medicare Other | Attending: Oncology

## 2018-11-03 ENCOUNTER — Inpatient Hospital Stay: Payer: Medicare Other

## 2018-11-03 VITALS — BP 143/55 | HR 59 | Temp 97.9°F | Resp 16 | Ht 65.0 in | Wt 300.0 lb

## 2018-11-03 DIAGNOSIS — Z170 Estrogen receptor positive status [ER+]: Secondary | ICD-10-CM

## 2018-11-03 DIAGNOSIS — C50919 Malignant neoplasm of unspecified site of unspecified female breast: Secondary | ICD-10-CM

## 2018-11-03 DIAGNOSIS — C50212 Malignant neoplasm of upper-inner quadrant of left female breast: Secondary | ICD-10-CM

## 2018-11-03 DIAGNOSIS — Z5112 Encounter for antineoplastic immunotherapy: Secondary | ICD-10-CM | POA: Diagnosis not present

## 2018-11-03 DIAGNOSIS — C7800 Secondary malignant neoplasm of unspecified lung: Secondary | ICD-10-CM

## 2018-11-03 LAB — COMPREHENSIVE METABOLIC PANEL
ALT: 33 U/L (ref 0–44)
AST: 26 U/L (ref 15–41)
Albumin: 3.3 g/dL — ABNORMAL LOW (ref 3.5–5.0)
Alkaline Phosphatase: 103 U/L (ref 38–126)
Anion gap: 11 (ref 5–15)
BUN: 12 mg/dL (ref 8–23)
CO2: 29 mmol/L (ref 22–32)
Calcium: 9.5 mg/dL (ref 8.9–10.3)
Chloride: 105 mmol/L (ref 98–111)
Creatinine, Ser: 0.8 mg/dL (ref 0.44–1.00)
GFR calc Af Amer: >60 mL/min (ref >60)
GFR calc non Af Amer: >60 mL/min (ref >60)
Glucose, Bld: 91 mg/dL (ref 70–99)
Potassium: 4.1 mmol/L (ref 3.5–5.1)
Sodium: 145 mmol/L (ref 135–145)
Total Bilirubin: 0.4 mg/dL (ref 0.3–1.2)
Total Protein: 7.4 g/dL (ref 6.5–8.1)

## 2018-11-03 LAB — CBC WITH DIFFERENTIAL/PLATELET
Abs Immature Granulocytes: 0.01 10*3/uL (ref 0.00–0.07)
Basophils Absolute: 0 10*3/uL (ref 0.0–0.1)
Basophils Relative: 0 %
Eosinophils Absolute: 0.1 10*3/uL (ref 0.0–0.5)
Eosinophils Relative: 2 %
HCT: 42.7 % (ref 36.0–46.0)
Hemoglobin: 13.5 g/dL (ref 12.0–15.0)
Immature Granulocytes: 0 %
Lymphocytes Relative: 32 %
Lymphs Abs: 1.6 10*3/uL (ref 0.7–4.0)
MCH: 28.5 pg (ref 26.0–34.0)
MCHC: 31.6 g/dL (ref 30.0–36.0)
MCV: 90.3 fL (ref 80.0–100.0)
Monocytes Absolute: 0.4 10*3/uL (ref 0.1–1.0)
Monocytes Relative: 8 %
Neutro Abs: 2.9 10*3/uL (ref 1.7–7.7)
Neutrophils Relative %: 58 %
Platelets: 218 10*3/uL (ref 150–400)
RBC: 4.73 MIL/uL (ref 3.87–5.11)
RDW: 14.1 % (ref 11.5–15.5)
WBC: 5.1 10*3/uL (ref 4.0–10.5)
nRBC: 0 % (ref 0.0–0.2)

## 2018-11-03 MED ORDER — DIPHENHYDRAMINE HCL 25 MG PO CAPS
25.0000 mg | ORAL_CAPSULE | Freq: Once | ORAL | Status: AC
  Administered 2018-11-03: 25 mg via ORAL

## 2018-11-03 MED ORDER — TRASTUZUMAB CHEMO 150 MG IV SOLR
900.0000 mg | Freq: Once | INTRAVENOUS | Status: AC
  Administered 2018-11-03: 900 mg via INTRAVENOUS
  Filled 2018-11-03: qty 42.86

## 2018-11-03 MED ORDER — ACETAMINOPHEN 325 MG PO TABS
ORAL_TABLET | ORAL | Status: AC
  Filled 2018-11-03: qty 2

## 2018-11-03 MED ORDER — LORAZEPAM 2 MG/ML IJ SOLN
INTRAMUSCULAR | Status: AC
  Filled 2018-11-03: qty 1

## 2018-11-03 MED ORDER — ACETAMINOPHEN 325 MG PO TABS
650.0000 mg | ORAL_TABLET | Freq: Once | ORAL | Status: AC
  Administered 2018-11-03: 650 mg via ORAL

## 2018-11-03 MED ORDER — LORAZEPAM 2 MG/ML IJ SOLN
0.5000 mg | Freq: Once | INTRAMUSCULAR | Status: AC
  Administered 2018-11-03: 0.5 mg via INTRAVENOUS

## 2018-11-03 MED ORDER — HEPARIN SOD (PORK) LOCK FLUSH 100 UNIT/ML IV SOLN
500.0000 [IU] | Freq: Once | INTRAVENOUS | Status: AC | PRN
  Administered 2018-11-03: 500 [IU]
  Filled 2018-11-03: qty 5

## 2018-11-03 MED ORDER — DIPHENHYDRAMINE HCL 25 MG PO CAPS
ORAL_CAPSULE | ORAL | Status: AC
  Filled 2018-11-03: qty 1

## 2018-11-03 MED ORDER — SODIUM CHLORIDE 0.9 % IJ SOLN
10.0000 mL | INTRAMUSCULAR | Status: DC | PRN
  Filled 2018-11-03: qty 10

## 2018-11-03 MED ORDER — SODIUM CHLORIDE 0.9 % IV SOLN
Freq: Once | INTRAVENOUS | Status: AC
  Administered 2018-11-03: 10:00:00 via INTRAVENOUS
  Filled 2018-11-03: qty 250

## 2018-11-03 NOTE — Patient Instructions (Signed)
[facility] Discharge Instructions for Patients Receiving Chemotherapy Today you received the following chemotherapy agents:  Herceptin To help prevent nausea and vomiting after your treatment, we encourage you to take your nausea medication as prescribed.   If you develop nausea and vomiting that is not controlled by your nausea medication, call the clinic.   BELOW ARE SYMPTOMS THAT SHOULD BE REPORTED IMMEDIATELY:  *FEVER GREATER THAN 100.5 F  *CHILLS WITH OR WITHOUT FEVER  NAUSEA AND VOMITING THAT IS NOT CONTROLLED WITH YOUR NAUSEA MEDICATION  *UNUSUAL SHORTNESS OF BREATH  *UNUSUAL BRUISING OR BLEEDING  TENDERNESS IN MOUTH AND THROAT WITH OR WITHOUT PRESENCE OF ULCERS  *URINARY PROBLEMS  *BOWEL PROBLEMS  UNUSUAL RASH Items with * indicate a potential emergency and should be followed up as soon as possible.  Feel free to call the clinic should you have any questions or concerns. The clinic phone number is [phone].  Please show the CHEMO ALERT CARD at check-in to the Emergency Department and triage nurse.   

## 2018-11-04 ENCOUNTER — Other Ambulatory Visit: Payer: Self-pay | Admitting: Family Medicine

## 2018-11-04 LAB — CANCER ANTIGEN 27.29: CA 27.29: 16.6 U/mL (ref 0.0–38.6)

## 2018-11-19 ENCOUNTER — Telehealth: Payer: Self-pay | Admitting: Family Medicine

## 2018-11-19 DIAGNOSIS — M4710 Other spondylosis with myelopathy, site unspecified: Secondary | ICD-10-CM

## 2018-11-19 NOTE — Telephone Encounter (Signed)
Doing ok Exercising a little more Trying to lose weight Remaining away a from people

## 2018-11-25 DIAGNOSIS — C50912 Malignant neoplasm of unspecified site of left female breast: Secondary | ICD-10-CM | POA: Diagnosis not present

## 2018-11-25 DIAGNOSIS — G8921 Chronic pain due to trauma: Secondary | ICD-10-CM | POA: Diagnosis not present

## 2018-11-25 DIAGNOSIS — M1380 Other specified arthritis, unspecified site: Secondary | ICD-10-CM | POA: Diagnosis not present

## 2018-11-30 DIAGNOSIS — I5020 Unspecified systolic (congestive) heart failure: Secondary | ICD-10-CM | POA: Diagnosis not present

## 2018-12-01 ENCOUNTER — Other Ambulatory Visit: Payer: Self-pay

## 2018-12-01 ENCOUNTER — Inpatient Hospital Stay: Payer: Medicare Other | Attending: Oncology

## 2018-12-01 ENCOUNTER — Inpatient Hospital Stay: Payer: Medicare Other

## 2018-12-01 VITALS — BP 128/61 | HR 67 | Temp 98.1°F | Resp 20 | Wt 300.0 lb

## 2018-12-01 DIAGNOSIS — E119 Type 2 diabetes mellitus without complications: Secondary | ICD-10-CM | POA: Insufficient documentation

## 2018-12-01 DIAGNOSIS — Z794 Long term (current) use of insulin: Secondary | ICD-10-CM | POA: Insufficient documentation

## 2018-12-01 DIAGNOSIS — Z5112 Encounter for antineoplastic immunotherapy: Secondary | ICD-10-CM | POA: Diagnosis present

## 2018-12-01 DIAGNOSIS — C50212 Malignant neoplasm of upper-inner quadrant of left female breast: Secondary | ICD-10-CM | POA: Insufficient documentation

## 2018-12-01 DIAGNOSIS — Z170 Estrogen receptor positive status [ER+]: Secondary | ICD-10-CM

## 2018-12-01 DIAGNOSIS — Z79899 Other long term (current) drug therapy: Secondary | ICD-10-CM | POA: Insufficient documentation

## 2018-12-01 DIAGNOSIS — Z8041 Family history of malignant neoplasm of ovary: Secondary | ICD-10-CM | POA: Insufficient documentation

## 2018-12-01 DIAGNOSIS — C7800 Secondary malignant neoplasm of unspecified lung: Secondary | ICD-10-CM | POA: Diagnosis not present

## 2018-12-01 DIAGNOSIS — I10 Essential (primary) hypertension: Secondary | ICD-10-CM | POA: Insufficient documentation

## 2018-12-01 DIAGNOSIS — Z803 Family history of malignant neoplasm of breast: Secondary | ICD-10-CM | POA: Insufficient documentation

## 2018-12-01 DIAGNOSIS — C50919 Malignant neoplasm of unspecified site of unspecified female breast: Secondary | ICD-10-CM

## 2018-12-01 DIAGNOSIS — Z7982 Long term (current) use of aspirin: Secondary | ICD-10-CM | POA: Diagnosis not present

## 2018-12-01 DIAGNOSIS — Z87891 Personal history of nicotine dependence: Secondary | ICD-10-CM | POA: Insufficient documentation

## 2018-12-01 LAB — COMPREHENSIVE METABOLIC PANEL
ALT: 28 U/L (ref 0–44)
AST: 21 U/L (ref 15–41)
Albumin: 3.3 g/dL — ABNORMAL LOW (ref 3.5–5.0)
Alkaline Phosphatase: 109 U/L (ref 38–126)
Anion gap: 10 (ref 5–15)
BUN: 15 mg/dL (ref 8–23)
CO2: 29 mmol/L (ref 22–32)
Calcium: 9.6 mg/dL (ref 8.9–10.3)
Chloride: 105 mmol/L (ref 98–111)
Creatinine, Ser: 0.76 mg/dL (ref 0.44–1.00)
GFR calc Af Amer: >60 mL/min (ref >60)
GFR calc non Af Amer: >60 mL/min (ref >60)
Glucose, Bld: 83 mg/dL (ref 70–99)
Potassium: 4.5 mmol/L (ref 3.5–5.1)
Sodium: 144 mmol/L (ref 135–145)
Total Bilirubin: 0.3 mg/dL (ref 0.3–1.2)
Total Protein: 7.7 g/dL (ref 6.5–8.1)

## 2018-12-01 LAB — CBC WITH DIFFERENTIAL/PLATELET
Abs Immature Granulocytes: 0.01 10*3/uL (ref 0.00–0.07)
Basophils Absolute: 0 10*3/uL (ref 0.0–0.1)
Basophils Relative: 1 %
Eosinophils Absolute: 0.1 10*3/uL (ref 0.0–0.5)
Eosinophils Relative: 2 %
HCT: 43.4 % (ref 36.0–46.0)
Hemoglobin: 13.7 g/dL (ref 12.0–15.0)
Immature Granulocytes: 0 %
Lymphocytes Relative: 35 %
Lymphs Abs: 2.3 10*3/uL (ref 0.7–4.0)
MCH: 28.5 pg (ref 26.0–34.0)
MCHC: 31.6 g/dL (ref 30.0–36.0)
MCV: 90.4 fL (ref 80.0–100.0)
Monocytes Absolute: 0.4 10*3/uL (ref 0.1–1.0)
Monocytes Relative: 7 %
Neutro Abs: 3.7 10*3/uL (ref 1.7–7.7)
Neutrophils Relative %: 55 %
Platelets: 230 10*3/uL (ref 150–400)
RBC: 4.8 MIL/uL (ref 3.87–5.11)
RDW: 13.8 % (ref 11.5–15.5)
WBC: 6.6 10*3/uL (ref 4.0–10.5)
nRBC: 0 % (ref 0.0–0.2)

## 2018-12-01 MED ORDER — DIPHENHYDRAMINE HCL 25 MG PO CAPS
25.0000 mg | ORAL_CAPSULE | Freq: Once | ORAL | Status: AC
  Administered 2018-12-01: 25 mg via ORAL

## 2018-12-01 MED ORDER — SODIUM CHLORIDE 0.9 % IJ SOLN
10.0000 mL | INTRAMUSCULAR | Status: DC | PRN
  Administered 2018-12-01: 10 mL
  Filled 2018-12-01: qty 10

## 2018-12-01 MED ORDER — HEPARIN SOD (PORK) LOCK FLUSH 100 UNIT/ML IV SOLN
500.0000 [IU] | Freq: Once | INTRAVENOUS | Status: AC | PRN
  Administered 2018-12-01: 500 [IU]
  Filled 2018-12-01: qty 5

## 2018-12-01 MED ORDER — ACETAMINOPHEN 325 MG PO TABS
650.0000 mg | ORAL_TABLET | Freq: Once | ORAL | Status: AC
  Administered 2018-12-01: 650 mg via ORAL

## 2018-12-01 MED ORDER — TRASTUZUMAB CHEMO 150 MG IV SOLR
900.0000 mg | Freq: Once | INTRAVENOUS | Status: AC
  Administered 2018-12-01: 900 mg via INTRAVENOUS
  Filled 2018-12-01: qty 42.86

## 2018-12-01 MED ORDER — LORAZEPAM 2 MG/ML IJ SOLN
INTRAMUSCULAR | Status: AC
  Filled 2018-12-01: qty 1

## 2018-12-01 MED ORDER — SODIUM CHLORIDE 0.9 % IV SOLN
Freq: Once | INTRAVENOUS | Status: AC
  Administered 2018-12-01: 10:00:00 via INTRAVENOUS
  Filled 2018-12-01: qty 250

## 2018-12-01 MED ORDER — LORAZEPAM 2 MG/ML IJ SOLN
0.5000 mg | Freq: Once | INTRAMUSCULAR | Status: AC
  Administered 2018-12-01: 0.5 mg via INTRAVENOUS

## 2018-12-01 MED ORDER — DIPHENHYDRAMINE HCL 25 MG PO CAPS
ORAL_CAPSULE | ORAL | Status: AC
  Filled 2018-12-01: qty 1

## 2018-12-01 MED ORDER — ACETAMINOPHEN 325 MG PO TABS
ORAL_TABLET | ORAL | Status: AC
  Filled 2018-12-01: qty 2

## 2018-12-01 NOTE — Patient Instructions (Signed)
[facility] Discharge Instructions for Patients Receiving Chemotherapy  Today you received the following chemotherapy agents Trastuzumab (HERCEPTIN).  To help prevent nausea and vomiting after your treatment, we encourage you to take your nausea medication as prescribed.  If you develop nausea and vomiting that is not controlled by your nausea medication, call the clinic.   BELOW ARE SYMPTOMS THAT SHOULD BE REPORTED IMMEDIATELY:  *FEVER GREATER THAN 100.5 F  *CHILLS WITH OR WITHOUT FEVER  NAUSEA AND VOMITING THAT IS NOT CONTROLLED WITH YOUR NAUSEA MEDICATION  *UNUSUAL SHORTNESS OF BREATH  *UNUSUAL BRUISING OR BLEEDING  TENDERNESS IN MOUTH AND THROAT WITH OR WITHOUT PRESENCE OF ULCERS  *URINARY PROBLEMS  *BOWEL PROBLEMS  UNUSUAL RASH Items with * indicate a potential emergency and should be followed up as soon as possible.  Feel free to call the clinic should you have any questions or concerns. The clinic phone number is [phone].  Please show the CHEMO ALERT CARD at check-in to the Emergency Department and triage nurse.  Coronavirus (COVID-19) Are you at risk?  Are you at risk for the Coronavirus (COVID-19)?  To be considered HIGH RISK for Coronavirus (COVID-19), you have to meet the following criteria:  . Traveled to [place], [place], [place], [place] or [place]; or in the [place] to [place], [place], [place], or [place]; and have fever, cough, and shortness of breath within the last 2 weeks of travel OR . Been in close contact with a person diagnosed with COVID-19 within the last 2 weeks and have fever, cough, and shortness of breath . IF YOU DO NOT MEET THESE CRITERIA, YOU ARE CONSIDERED LOW RISK FOR COVID-19.  What to do if you are HIGH RISK for COVID-19?  . If you are having a medical emergency, call 911. . Seek medical care right away. Before you go to a doctor's office, urgent care or emergency department, call ahead and tell them  about your recent travel, contact with someone diagnosed with COVID-19, and your symptoms. You should receive instructions from your physician's office regarding next steps of care.  . When you arrive at healthcare provider, tell the healthcare staff immediately you have returned from visiting [place], [place], [place], [place] or [place]; or traveled in the [place] to [place], [place], [place], or [place]; in the last two weeks or you have been in close contact with a person diagnosed with COVID-19 in the last 2 weeks.   . Tell the health care staff about your symptoms: fever, cough and shortness of breath. . After you have been seen by a medical provider, you will be either: o Tested for (COVID-19) and discharged home on quarantine except to seek medical care if symptoms worsen, and asked to  - Stay home and avoid contact with others until you get your results (4-5 days)  - Avoid travel on public transportation if possible (such as bus, train, or airplane) or o Sent to the Emergency Department by EMS for evaluation, COVID-19 testing, and possible admission depending on your condition and test results.  What to do if you are LOW RISK for COVID-19?  Reduce your risk of any infection by using the same precautions used for avoiding the common cold or flu:  . Wash your hands often with soap and warm water for at least 20 seconds.  If soap and water are not readily available, use an alcohol-based hand sanitizer with at least 60% alcohol.  . If coughing or sneezing,   cover your mouth and nose by coughing or sneezing into the elbow areas of your shirt or coat, into a tissue or into your sleeve (not your hands). . Avoid shaking hands with others and consider head nods or verbal greetings only. . Avoid touching your eyes, nose, or mouth with unwashed hands.  . Avoid close contact with people who are sick. . Avoid places or events with large numbers of people in one location, like concerts or  sporting events. . Carefully consider travel plans you have or are making. . If you are planning any travel outside or inside the [place], visit the CDC's Travelers' Health webpage for the latest health notices. . If you have some symptoms but not all symptoms, continue to monitor at home and seek medical attention if your symptoms worsen. . If you are having a medical emergency, call 911.   ADDITIONAL HEALTHCARE OPTIONS FOR PATIENTS  [facility] / e-Visit: [email or web address]         [facility]: [phone]  [facility]: [phone]                   [facility]: [phone]   

## 2018-12-02 LAB — CANCER ANTIGEN 27.29: CA 27.29: 22.3 U/mL (ref 0.0–38.6)

## 2018-12-07 ENCOUNTER — Other Ambulatory Visit: Payer: Self-pay | Admitting: Family Medicine

## 2018-12-17 ENCOUNTER — Other Ambulatory Visit (HOSPITAL_COMMUNITY): Payer: Medicare Other

## 2018-12-20 ENCOUNTER — Other Ambulatory Visit (HOSPITAL_COMMUNITY): Payer: Medicare Other

## 2018-12-21 ENCOUNTER — Other Ambulatory Visit (HOSPITAL_COMMUNITY): Payer: Medicare Other

## 2018-12-24 ENCOUNTER — Other Ambulatory Visit (HOSPITAL_COMMUNITY): Payer: Medicare Other
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
## 2018-12-29 ENCOUNTER — Inpatient Hospital Stay: Payer: Medicare Other

## 2018-12-29 ENCOUNTER — Inpatient Hospital Stay: Payer: Medicare Other | Attending: Oncology

## 2018-12-29 ENCOUNTER — Other Ambulatory Visit: Payer: Self-pay

## 2018-12-29 ENCOUNTER — Other Ambulatory Visit: Payer: Medicare Other

## 2018-12-29 ENCOUNTER — Inpatient Hospital Stay (HOSPITAL_BASED_OUTPATIENT_CLINIC_OR_DEPARTMENT_OTHER): Payer: Medicare Other | Admitting: Oncology

## 2018-12-29 ENCOUNTER — Other Ambulatory Visit: Payer: Self-pay | Admitting: Oncology

## 2018-12-29 ENCOUNTER — Ambulatory Visit: Payer: Medicare Other | Admitting: Oncology

## 2018-12-29 ENCOUNTER — Ambulatory Visit: Payer: Medicare Other

## 2018-12-29 ENCOUNTER — Encounter: Payer: Self-pay | Admitting: Oncology

## 2018-12-29 VITALS — BP 179/75 | HR 59 | Temp 97.6°F | Resp 18 | Ht 65.0 in | Wt 291.8 lb

## 2018-12-29 DIAGNOSIS — Z7901 Long term (current) use of anticoagulants: Secondary | ICD-10-CM | POA: Diagnosis not present

## 2018-12-29 DIAGNOSIS — Z87891 Personal history of nicotine dependence: Secondary | ICD-10-CM

## 2018-12-29 DIAGNOSIS — Z79899 Other long term (current) drug therapy: Secondary | ICD-10-CM

## 2018-12-29 DIAGNOSIS — E119 Type 2 diabetes mellitus without complications: Secondary | ICD-10-CM | POA: Diagnosis not present

## 2018-12-29 DIAGNOSIS — Z803 Family history of malignant neoplasm of breast: Secondary | ICD-10-CM | POA: Diagnosis not present

## 2018-12-29 DIAGNOSIS — Z86718 Personal history of other venous thrombosis and embolism: Secondary | ICD-10-CM | POA: Diagnosis not present

## 2018-12-29 DIAGNOSIS — Z170 Estrogen receptor positive status [ER+]: Secondary | ICD-10-CM

## 2018-12-29 DIAGNOSIS — I252 Old myocardial infarction: Secondary | ICD-10-CM | POA: Diagnosis not present

## 2018-12-29 DIAGNOSIS — Z7951 Long term (current) use of inhaled steroids: Secondary | ICD-10-CM | POA: Insufficient documentation

## 2018-12-29 DIAGNOSIS — Z794 Long term (current) use of insulin: Secondary | ICD-10-CM | POA: Insufficient documentation

## 2018-12-29 DIAGNOSIS — C7800 Secondary malignant neoplasm of unspecified lung: Secondary | ICD-10-CM

## 2018-12-29 DIAGNOSIS — Z79811 Long term (current) use of aromatase inhibitors: Secondary | ICD-10-CM

## 2018-12-29 DIAGNOSIS — I10 Essential (primary) hypertension: Secondary | ICD-10-CM | POA: Insufficient documentation

## 2018-12-29 DIAGNOSIS — C50212 Malignant neoplasm of upper-inner quadrant of left female breast: Secondary | ICD-10-CM

## 2018-12-29 DIAGNOSIS — Z5112 Encounter for antineoplastic immunotherapy: Secondary | ICD-10-CM | POA: Insufficient documentation

## 2018-12-29 DIAGNOSIS — Z7982 Long term (current) use of aspirin: Secondary | ICD-10-CM | POA: Diagnosis not present

## 2018-12-29 DIAGNOSIS — C50919 Malignant neoplasm of unspecified site of unspecified female breast: Secondary | ICD-10-CM

## 2018-12-29 LAB — CBC WITH DIFFERENTIAL/PLATELET
Abs Immature Granulocytes: 0.02 10*3/uL (ref 0.00–0.07)
Basophils Absolute: 0 10*3/uL (ref 0.0–0.1)
Basophils Relative: 0 %
Eosinophils Absolute: 0.1 10*3/uL (ref 0.0–0.5)
Eosinophils Relative: 2 %
HCT: 45.5 % (ref 36.0–46.0)
Hemoglobin: 14.4 g/dL (ref 12.0–15.0)
Immature Granulocytes: 0 %
Lymphocytes Relative: 37 %
Lymphs Abs: 2.3 10*3/uL (ref 0.7–4.0)
MCH: 28.4 pg (ref 26.0–34.0)
MCHC: 31.6 g/dL (ref 30.0–36.0)
MCV: 89.7 fL (ref 80.0–100.0)
Monocytes Absolute: 0.4 10*3/uL (ref 0.1–1.0)
Monocytes Relative: 6 %
Neutro Abs: 3.5 10*3/uL (ref 1.7–7.7)
Neutrophils Relative %: 55 %
Platelets: 236 10*3/uL (ref 150–400)
RBC: 5.07 MIL/uL (ref 3.87–5.11)
RDW: 14.4 % (ref 11.5–15.5)
WBC: 6.4 10*3/uL (ref 4.0–10.5)
nRBC: 0 % (ref 0.0–0.2)

## 2018-12-29 LAB — COMPREHENSIVE METABOLIC PANEL
ALT: 35 U/L (ref 0–44)
AST: 24 U/L (ref 15–41)
Albumin: 3.5 g/dL (ref 3.5–5.0)
Alkaline Phosphatase: 128 U/L — ABNORMAL HIGH (ref 38–126)
Anion gap: 8 (ref 5–15)
BUN: 15 mg/dL (ref 8–23)
CO2: 31 mmol/L (ref 22–32)
Calcium: 9.7 mg/dL (ref 8.9–10.3)
Chloride: 103 mmol/L (ref 98–111)
Creatinine, Ser: 0.78 mg/dL (ref 0.44–1.00)
GFR calc Af Amer: >60 mL/min (ref >60)
GFR calc non Af Amer: >60 mL/min (ref >60)
Glucose, Bld: 108 mg/dL — ABNORMAL HIGH (ref 70–99)
Potassium: 4 mmol/L (ref 3.5–5.1)
Sodium: 142 mmol/L (ref 135–145)
Total Bilirubin: 0.4 mg/dL (ref 0.3–1.2)
Total Protein: 7.8 g/dL (ref 6.5–8.1)

## 2018-12-29 MED ORDER — DIPHENHYDRAMINE HCL 25 MG PO CAPS
ORAL_CAPSULE | ORAL | Status: AC
  Filled 2018-12-29: qty 1

## 2018-12-29 MED ORDER — TRASTUZUMAB CHEMO 150 MG IV SOLR
6.0000 mg/kg | Freq: Once | INTRAVENOUS | Status: AC
  Administered 2018-12-29: 14:00:00 798 mg via INTRAVENOUS
  Filled 2018-12-29: qty 38

## 2018-12-29 MED ORDER — LORAZEPAM 2 MG/ML IJ SOLN
INTRAMUSCULAR | Status: AC
  Filled 2018-12-29: qty 1

## 2018-12-29 MED ORDER — ACETAMINOPHEN 325 MG PO TABS
ORAL_TABLET | ORAL | Status: AC
  Filled 2018-12-29: qty 1

## 2018-12-29 MED ORDER — ACETAMINOPHEN 325 MG PO TABS
ORAL_TABLET | ORAL | Status: AC
  Filled 2018-12-29: qty 2

## 2018-12-29 MED ORDER — LORAZEPAM 2 MG/ML IJ SOLN
0.5000 mg | Freq: Once | INTRAMUSCULAR | Status: AC
  Administered 2018-12-29: 0.5 mg via INTRAVENOUS

## 2018-12-29 MED ORDER — SODIUM CHLORIDE 0.9% FLUSH
10.0000 mL | INTRAVENOUS | Status: DC | PRN
  Filled 2018-12-29: qty 10

## 2018-12-29 MED ORDER — HEPARIN SOD (PORK) LOCK FLUSH 100 UNIT/ML IV SOLN
500.0000 [IU] | Freq: Once | INTRAVENOUS | Status: AC | PRN
  Administered 2018-12-29: 15:00:00 500 [IU]
  Filled 2018-12-29: qty 5

## 2018-12-29 MED ORDER — LETROZOLE 2.5 MG PO TABS: 2.5000 mg | ORAL_TABLET | Freq: Every day | ORAL | 6 refills | Status: DC

## 2018-12-29 MED ORDER — ACETAMINOPHEN 325 MG PO TABS
650.0000 mg | ORAL_TABLET | Freq: Once | ORAL | Status: AC
  Administered 2018-12-29: 13:00:00 650 mg via ORAL

## 2018-12-29 MED ORDER — SODIUM CHLORIDE 0.9 % IV SOLN
Freq: Once | INTRAVENOUS | Status: AC
  Administered 2018-12-29: 13:00:00 via INTRAVENOUS
  Filled 2018-12-29: qty 250

## 2018-12-29 MED ORDER — SODIUM CHLORIDE 0.9 % IJ SOLN
10.0000 mL | INTRAMUSCULAR | Status: DC | PRN
  Administered 2018-12-29: 15:00:00 10 mL
  Filled 2018-12-29: qty 10

## 2018-12-29 MED ORDER — DIPHENHYDRAMINE HCL 25 MG PO CAPS
25.0000 mg | ORAL_CAPSULE | Freq: Once | ORAL | Status: AC
  Administered 2018-12-29: 13:00:00 25 mg via ORAL

## 2018-12-29 MED FILL — Sodium Chloride Flush IV Soln 0.9%: INTRAVENOUS | Qty: 10 | Status: AC

## 2018-12-29 NOTE — Patient Instructions (Signed)
[facility] Discharge Instructions for Patients Receiving Chemotherapy  Today you received the following chemotherapy agents Trastuzumab (HERCEPTIN).  To help prevent nausea and vomiting after your treatment, we encourage you to take your nausea medication as prescribed.  If you develop nausea and vomiting that is not controlled by your nausea medication, call the clinic.   BELOW ARE SYMPTOMS THAT SHOULD BE REPORTED IMMEDIATELY:  *FEVER GREATER THAN 100.5 F  *CHILLS WITH OR WITHOUT FEVER  NAUSEA AND VOMITING THAT IS NOT CONTROLLED WITH YOUR NAUSEA MEDICATION  *UNUSUAL SHORTNESS OF BREATH  *UNUSUAL BRUISING OR BLEEDING  TENDERNESS IN MOUTH AND THROAT WITH OR WITHOUT PRESENCE OF ULCERS  *URINARY PROBLEMS  *BOWEL PROBLEMS  UNUSUAL RASH Items with * indicate a potential emergency and should be followed up as soon as possible.  Feel free to call the clinic should you have any questions or concerns. The clinic phone number is [phone].  Please show the CHEMO ALERT CARD at check-in to the Emergency Department and triage nurse.  Coronavirus (COVID-19) Are you at risk?  Are you at risk for the Coronavirus (COVID-19)?  To be considered HIGH RISK for Coronavirus (COVID-19), you have to meet the following criteria:  . Traveled to [place], [place], [place], [place] or [place]; or in the [place] to [place], [place], [place], or [place]; and have fever, cough, and shortness of breath within the last 2 weeks of travel OR . Been in close contact with a person diagnosed with COVID-19 within the last 2 weeks and have fever, cough, and shortness of breath . IF YOU DO NOT MEET THESE CRITERIA, YOU ARE CONSIDERED LOW RISK FOR COVID-19.  What to do if you are HIGH RISK for COVID-19?  . If you are having a medical emergency, call 911. . Seek medical care right away. Before you go to a doctor's office, urgent care or emergency department, call ahead and tell them  about your recent travel, contact with someone diagnosed with COVID-19, and your symptoms. You should receive instructions from your physician's office regarding next steps of care.  . When you arrive at healthcare provider, tell the healthcare staff immediately you have returned from visiting [place], [place], [place], [place] or [place]; or traveled in the [place] to [place], [place], [place], or [place]; in the last two weeks or you have been in close contact with a person diagnosed with COVID-19 in the last 2 weeks.   . Tell the health care staff about your symptoms: fever, cough and shortness of breath. . After you have been seen by a medical provider, you will be either: o Tested for (COVID-19) and discharged home on quarantine except to seek medical care if symptoms worsen, and asked to  - Stay home and avoid contact with others until you get your results (4-5 days)  - Avoid travel on public transportation if possible (such as bus, train, or airplane) or o Sent to the Emergency Department by EMS for evaluation, COVID-19 testing, and possible admission depending on your condition and test results.  What to do if you are LOW RISK for COVID-19?  Reduce your risk of any infection by using the same precautions used for avoiding the common cold or flu:  . Wash your hands often with soap and warm water for at least 20 seconds.  If soap and water are not readily available, use an alcohol-based hand sanitizer with at least 60% alcohol.  . If coughing or sneezing,   cover your mouth and nose by coughing or sneezing into the elbow areas of your shirt or coat, into a tissue or into your sleeve (not your hands). . Avoid shaking hands with others and consider head nods or verbal greetings only. . Avoid touching your eyes, nose, or mouth with unwashed hands.  . Avoid close contact with people who are sick. . Avoid places or events with large numbers of people in one location, like concerts or  sporting events. . Carefully consider travel plans you have or are making. . If you are planning any travel outside or inside the [place], visit the CDC's Travelers' Health webpage for the latest health notices. . If you have some symptoms but not all symptoms, continue to monitor at home and seek medical attention if your symptoms worsen. . If you are having a medical emergency, call 911.   ADDITIONAL HEALTHCARE OPTIONS FOR PATIENTS  [facility] / e-Visit: [email or web address]         [facility]: [phone]  [facility]: [phone]                   [facility]: [phone]   

## 2018-12-29 NOTE — Progress Notes (Signed)
Dose of Herceptin updated w/ today's weight. MD aware pt lost weight and noted pt has changed her diet significantly.  He feels this weight loss is not temporary. Kennith Center, Pharm.D., CPP 12/29/2018@1 :21 PM

## 2018-12-30 ENCOUNTER — Telehealth: Payer: Self-pay | Admitting: Oncology

## 2018-12-30 DIAGNOSIS — I5020 Unspecified systolic (congestive) heart failure: Secondary | ICD-10-CM | POA: Diagnosis not present

## 2018-12-30 LAB — CANCER ANTIGEN 27.29: CA 27.29: 30 U/mL (ref 0.0–38.6)

## 2018-12-30 NOTE — Telephone Encounter (Signed)
Tried to reach regarding schedule °

## 2019-01-18 ENCOUNTER — Encounter: Payer: Self-pay | Admitting: General Practice

## 2019-01-18 NOTE — Progress Notes (Signed)
[place] CSW Progress Notes  Call from patient, wants to know how to apply for "cancer Medicaid."  Has not been able to pay for monthly bills related to cancer treatments, does not have copays.  Has not gotten scans because she is unable to pay and does not want to continue to incur debt. Patient believes she owes a substantial amount to [facility] where she has received the majority of her treatment.  Has been sent to collections for unpaid medical debt.  Recently moved - rent is now higher than before and also owes additional money for water bill.  Due to medical debt, her rent is higher as her credit was damaged.  CSW discussed options including completing a Medicaid application at [place], investigating Medicare options by educating herself on the best policy for her needs through information available from organizations like [facility], [employer] [place] [phone]) for counseling regarding best choices in insurance for her situation.  She can also apply for small grants from [place], [employer] [place] and [employer].  CSW will send email w all this information to patient.  Edwyna Shell, LCSW Clinical Social Worker Phone:  [phone]

## 2019-01-23 ENCOUNTER — Other Ambulatory Visit: Payer: Self-pay | Admitting: Family Medicine

## 2019-01-24 ENCOUNTER — Other Ambulatory Visit (HOSPITAL_COMMUNITY): Payer: Medicare Other

## 2019-01-25 ENCOUNTER — Encounter: Payer: Self-pay | Admitting: General Practice

## 2019-01-25 NOTE — Progress Notes (Signed)
[place] CSW Progress Notes  Call to [name], patient's MEdicaid worker is [employer] [phone].  VM left for worker to determine what is needed for DSS review.  Edwyna Shell, LCSW Clinical Social Worker Phone:  [phone]

## 2019-01-25 NOTE — Progress Notes (Addendum)
[place] CSW Progress Notes  Patient working on [employer] application with [employer] (worker is Solicitor per patient).  Is requesting copies of bills unpaid by insurance for 2018, 2019 and 2020.  CSW will investigate best option for assisting patient.  Edwyna Shell, LCSW Clinical Social Worker Phone:  [phone]

## 2019-01-26 ENCOUNTER — Other Ambulatory Visit: Payer: Self-pay

## 2019-01-26 ENCOUNTER — Inpatient Hospital Stay: Payer: Medicare Other | Attending: Oncology

## 2019-01-26 ENCOUNTER — Inpatient Hospital Stay: Payer: Medicare Other

## 2019-01-26 ENCOUNTER — Inpatient Hospital Stay (HOSPITAL_BASED_OUTPATIENT_CLINIC_OR_DEPARTMENT_OTHER): Payer: Medicare Other | Admitting: Medical

## 2019-01-26 VITALS — BP 170/65 | HR 62 | Temp 98.4°F | Resp 18 | Wt 291.0 lb

## 2019-01-26 DIAGNOSIS — I252 Old myocardial infarction: Secondary | ICD-10-CM | POA: Insufficient documentation

## 2019-01-26 DIAGNOSIS — C7800 Secondary malignant neoplasm of unspecified lung: Secondary | ICD-10-CM | POA: Diagnosis not present

## 2019-01-26 DIAGNOSIS — C50212 Malignant neoplasm of upper-inner quadrant of left female breast: Secondary | ICD-10-CM

## 2019-01-26 DIAGNOSIS — Z803 Family history of malignant neoplasm of breast: Secondary | ICD-10-CM | POA: Insufficient documentation

## 2019-01-26 DIAGNOSIS — Z7901 Long term (current) use of anticoagulants: Secondary | ICD-10-CM | POA: Diagnosis not present

## 2019-01-26 DIAGNOSIS — Z7951 Long term (current) use of inhaled steroids: Secondary | ICD-10-CM | POA: Diagnosis not present

## 2019-01-26 DIAGNOSIS — Z170 Estrogen receptor positive status [ER+]: Secondary | ICD-10-CM | POA: Insufficient documentation

## 2019-01-26 DIAGNOSIS — Z79811 Long term (current) use of aromatase inhibitors: Secondary | ICD-10-CM | POA: Insufficient documentation

## 2019-01-26 DIAGNOSIS — I10 Essential (primary) hypertension: Secondary | ICD-10-CM | POA: Diagnosis not present

## 2019-01-26 DIAGNOSIS — E119 Type 2 diabetes mellitus without complications: Secondary | ICD-10-CM | POA: Diagnosis not present

## 2019-01-26 DIAGNOSIS — Z5112 Encounter for antineoplastic immunotherapy: Secondary | ICD-10-CM | POA: Insufficient documentation

## 2019-01-26 DIAGNOSIS — Z7982 Long term (current) use of aspirin: Secondary | ICD-10-CM | POA: Insufficient documentation

## 2019-01-26 DIAGNOSIS — Z79899 Other long term (current) drug therapy: Secondary | ICD-10-CM | POA: Diagnosis not present

## 2019-01-26 DIAGNOSIS — Z794 Long term (current) use of insulin: Secondary | ICD-10-CM | POA: Diagnosis not present

## 2019-01-26 DIAGNOSIS — Z87891 Personal history of nicotine dependence: Secondary | ICD-10-CM | POA: Diagnosis not present

## 2019-01-26 DIAGNOSIS — H1131 Conjunctival hemorrhage, right eye: Secondary | ICD-10-CM

## 2019-01-26 DIAGNOSIS — Z86718 Personal history of other venous thrombosis and embolism: Secondary | ICD-10-CM | POA: Insufficient documentation

## 2019-01-26 DIAGNOSIS — C50919 Malignant neoplasm of unspecified site of unspecified female breast: Secondary | ICD-10-CM

## 2019-01-26 LAB — COMPREHENSIVE METABOLIC PANEL
ALT: 31 U/L (ref 0–44)
AST: 22 U/L (ref 15–41)
Albumin: 3.3 g/dL — ABNORMAL LOW (ref 3.5–5.0)
Alkaline Phosphatase: 118 U/L (ref 38–126)
Anion gap: 11 (ref 5–15)
BUN: 17 mg/dL (ref 8–23)
CO2: 29 mmol/L (ref 22–32)
Calcium: 9.3 mg/dL (ref 8.9–10.3)
Chloride: 101 mmol/L (ref 98–111)
Creatinine, Ser: 0.85 mg/dL (ref 0.44–1.00)
GFR calc Af Amer: >60 mL/min (ref >60)
GFR calc non Af Amer: >60 mL/min (ref >60)
Glucose, Bld: 202 mg/dL — ABNORMAL HIGH (ref 70–99)
Potassium: 4.2 mmol/L (ref 3.5–5.1)
Sodium: 141 mmol/L (ref 135–145)
Total Bilirubin: 0.3 mg/dL (ref 0.3–1.2)
Total Protein: 7.7 g/dL (ref 6.5–8.1)

## 2019-01-26 LAB — CBC WITH DIFFERENTIAL/PLATELET
Abs Immature Granulocytes: 0.02 10*3/uL (ref 0.00–0.07)
Basophils Absolute: 0 10*3/uL (ref 0.0–0.1)
Basophils Relative: 0 %
Eosinophils Absolute: 0.1 10*3/uL (ref 0.0–0.5)
Eosinophils Relative: 2 %
HCT: 44.2 % (ref 36.0–46.0)
Hemoglobin: 13.8 g/dL (ref 12.0–15.0)
Immature Granulocytes: 0 %
Lymphocytes Relative: 31 %
Lymphs Abs: 2 10*3/uL (ref 0.7–4.0)
MCH: 28.3 pg (ref 26.0–34.0)
MCHC: 31.2 g/dL (ref 30.0–36.0)
MCV: 90.6 fL (ref 80.0–100.0)
Monocytes Absolute: 0.4 10*3/uL (ref 0.1–1.0)
Monocytes Relative: 7 %
Neutro Abs: 3.8 10*3/uL (ref 1.7–7.7)
Neutrophils Relative %: 60 %
Platelets: 245 10*3/uL (ref 150–400)
RBC: 4.88 MIL/uL (ref 3.87–5.11)
RDW: 14.3 % (ref 11.5–15.5)
WBC: 6.4 10*3/uL (ref 4.0–10.5)
nRBC: 0 % (ref 0.0–0.2)

## 2019-01-26 MED ORDER — ACETAMINOPHEN 325 MG PO TABS
ORAL_TABLET | ORAL | Status: AC
  Filled 2019-01-26: qty 2

## 2019-01-26 MED ORDER — HEPARIN SOD (PORK) LOCK FLUSH 100 UNIT/ML IV SOLN
500.0000 [IU] | Freq: Once | INTRAVENOUS | Status: AC | PRN
  Administered 2019-01-26: 500 [IU]
  Filled 2019-01-26: qty 5

## 2019-01-26 MED ORDER — LORAZEPAM 2 MG/ML IJ SOLN
INTRAMUSCULAR | Status: AC
  Filled 2019-01-26: qty 1

## 2019-01-26 MED ORDER — SODIUM CHLORIDE 0.9 % IV SOLN
Freq: Once | INTRAVENOUS | Status: AC
  Administered 2019-01-26: 10:00:00 via INTRAVENOUS
  Filled 2019-01-26: qty 250

## 2019-01-26 MED ORDER — DIPHENHYDRAMINE HCL 25 MG PO CAPS
ORAL_CAPSULE | ORAL | Status: AC
  Filled 2019-01-26: qty 1

## 2019-01-26 MED ORDER — SODIUM CHLORIDE 0.9% FLUSH
10.0000 mL | Freq: Once | INTRAVENOUS | Status: DC
  Filled 2019-01-26: qty 10

## 2019-01-26 MED ORDER — TRASTUZUMAB CHEMO 150 MG IV SOLR
750.0000 mg | Freq: Once | INTRAVENOUS | Status: AC
  Administered 2019-01-26: 10:00:00 750 mg via INTRAVENOUS
  Filled 2019-01-26: qty 35.72

## 2019-01-26 MED ORDER — ACETAMINOPHEN 325 MG PO TABS
650.0000 mg | ORAL_TABLET | Freq: Once | ORAL | Status: AC
  Administered 2019-01-26: 10:00:00 650 mg via ORAL

## 2019-01-26 MED ORDER — LORAZEPAM 2 MG/ML IJ SOLN
0.5000 mg | Freq: Once | INTRAMUSCULAR | Status: AC
  Administered 2019-01-26: 0.5 mg via INTRAVENOUS

## 2019-01-26 MED ORDER — SODIUM CHLORIDE 0.9 % IJ SOLN: 10.0000 mL | INTRAMUSCULAR | Status: DC | PRN

## 2019-01-26 MED ORDER — DIPHENHYDRAMINE HCL 25 MG PO CAPS
25.0000 mg | ORAL_CAPSULE | Freq: Once | ORAL | Status: AC
  Administered 2019-01-26: 25 mg via ORAL

## 2019-01-26 NOTE — Progress Notes (Signed)
[name] was seen in the infusion room today for a scleral hemorrhage of the right eye.  She reports that she awakened yesterday and noticed that there was an area of hemorrhage and her right lateral sclera.  She denies any heavy lifting, vomiting, or bearing down for a bowel movement.  She reports that she rubbed her eye sometime in the night.  She is not on any anticoagulation.  Her blood pressure was not elevated.  Her platelet count and LFTs were within normal range.  She was reassured and told that this would resolve without intervention.  Sandi Mealy, MHS, PA-C Physician Assistant

## 2019-01-26 NOTE — Patient Instructions (Signed)
[facility] Discharge Instructions for Patients Receiving Chemotherapy  Today you received the following chemotherapy agents Trastuzumab (HERCEPTIN).  To help prevent nausea and vomiting after your treatment, we encourage you to take your nausea medication as prescribed.  If you develop nausea and vomiting that is not controlled by your nausea medication, call the clinic.   BELOW ARE SYMPTOMS THAT SHOULD BE REPORTED IMMEDIATELY:  *FEVER GREATER THAN 100.5 F  *CHILLS WITH OR WITHOUT FEVER  NAUSEA AND VOMITING THAT IS NOT CONTROLLED WITH YOUR NAUSEA MEDICATION  *UNUSUAL SHORTNESS OF BREATH  *UNUSUAL BRUISING OR BLEEDING  TENDERNESS IN MOUTH AND THROAT WITH OR WITHOUT PRESENCE OF ULCERS  *URINARY PROBLEMS  *BOWEL PROBLEMS  UNUSUAL RASH Items with * indicate a potential emergency and should be followed up as soon as possible.  Feel free to call the clinic should you have any questions or concerns. The clinic phone number is [phone].  Please show the CHEMO ALERT CARD at check-in to the Emergency Department and triage nurse.  Coronavirus (COVID-19) Are you at risk?  Are you at risk for the Coronavirus (COVID-19)?  To be considered HIGH RISK for Coronavirus (COVID-19), you have to meet the following criteria:  . Traveled to [place], [place], [place], [place] or [place]; or in the [place] to [place], [place], [place], or [place]; and have fever, cough, and shortness of breath within the last 2 weeks of travel OR . Been in close contact with a person diagnosed with COVID-19 within the last 2 weeks and have fever, cough, and shortness of breath . IF YOU DO NOT MEET THESE CRITERIA, YOU ARE CONSIDERED LOW RISK FOR COVID-19.  What to do if you are HIGH RISK for COVID-19?  . If you are having a medical emergency, call 911. . Seek medical care right away. Before you go to a doctor's office, urgent care or emergency department, call ahead and tell them  about your recent travel, contact with someone diagnosed with COVID-19, and your symptoms. You should receive instructions from your physician's office regarding next steps of care.  . When you arrive at healthcare provider, tell the healthcare staff immediately you have returned from visiting [place], [place], [place], [place] or [place]; or traveled in the [place] to [place], [place], [place], or [place]; in the last two weeks or you have been in close contact with a person diagnosed with COVID-19 in the last 2 weeks.   . Tell the health care staff about your symptoms: fever, cough and shortness of breath. . After you have been seen by a medical provider, you will be either: o Tested for (COVID-19) and discharged home on quarantine except to seek medical care if symptoms worsen, and asked to  - Stay home and avoid contact with others until you get your results (4-5 days)  - Avoid travel on public transportation if possible (such as bus, train, or airplane) or o Sent to the Emergency Department by EMS for evaluation, COVID-19 testing, and possible admission depending on your condition and test results.  What to do if you are LOW RISK for COVID-19?  Reduce your risk of any infection by using the same precautions used for avoiding the common cold or flu:  . Wash your hands often with soap and warm water for at least 20 seconds.  If soap and water are not readily available, use an alcohol-based hand sanitizer with at least 60% alcohol.  . If coughing or sneezing,   cover your mouth and nose by coughing or sneezing into the elbow areas of your shirt or coat, into a tissue or into your sleeve (not your hands). . Avoid shaking hands with others and consider head nods or verbal greetings only. . Avoid touching your eyes, nose, or mouth with unwashed hands.  . Avoid close contact with people who are sick. . Avoid places or events with large numbers of people in one location, like concerts or  sporting events. . Carefully consider travel plans you have or are making. . If you are planning any travel outside or inside the [place], visit the CDC's Travelers' Health webpage for the latest health notices. . If you have some symptoms but not all symptoms, continue to monitor at home and seek medical attention if your symptoms worsen. . If you are having a medical emergency, call 911.   ADDITIONAL HEALTHCARE OPTIONS FOR PATIENTS  [facility] / e-Visit: [email or web address]         [facility]: [phone]  [facility]: [phone]                   [facility]: [phone]   

## 2019-01-27 LAB — CANCER ANTIGEN 27.29: CA 27.29: 28.6 U/mL (ref 0.0–38.6)

## 2019-01-30 DIAGNOSIS — I5020 Unspecified systolic (congestive) heart failure: Secondary | ICD-10-CM | POA: Diagnosis not present

## 2019-02-10 ENCOUNTER — Encounter: Payer: Self-pay | Admitting: General Practice

## 2019-02-10 NOTE — Progress Notes (Signed)
[place] CSW Progress Notes  Call from [place] Medicaid worker.  States that she needs unpaid medical bills faxed to [place] at [phone]  for patient for 08/18/2018 - present in order to determine if she may be eligible for Medicaid.  Patient has been unable to obtain these records from billing. Assistance requested from Ulice Dash, [employer] for cancer center.  Awaiting reply.  Patient has not responded to information mailed on [place] which are outside resources for financial assistance available for patients diagnosed w breast cancer.    Edwyna Shell, LCSW Clinical Social Worker Phone:  [phone]

## 2019-02-23 ENCOUNTER — Inpatient Hospital Stay: Payer: Medicare Other

## 2019-02-23 ENCOUNTER — Inpatient Hospital Stay: Payer: Medicare Other | Attending: Oncology

## 2019-02-23 ENCOUNTER — Other Ambulatory Visit: Payer: Self-pay

## 2019-02-23 VITALS — BP 154/69 | HR 54 | Temp 98.4°F | Resp 16 | Wt 291.8 lb

## 2019-02-23 DIAGNOSIS — C50212 Malignant neoplasm of upper-inner quadrant of left female breast: Secondary | ICD-10-CM

## 2019-02-23 DIAGNOSIS — Z7901 Long term (current) use of anticoagulants: Secondary | ICD-10-CM | POA: Diagnosis not present

## 2019-02-23 DIAGNOSIS — I252 Old myocardial infarction: Secondary | ICD-10-CM | POA: Insufficient documentation

## 2019-02-23 DIAGNOSIS — Z7984 Long term (current) use of oral hypoglycemic drugs: Secondary | ICD-10-CM | POA: Diagnosis not present

## 2019-02-23 DIAGNOSIS — Z7982 Long term (current) use of aspirin: Secondary | ICD-10-CM | POA: Insufficient documentation

## 2019-02-23 DIAGNOSIS — Z79811 Long term (current) use of aromatase inhibitors: Secondary | ICD-10-CM | POA: Diagnosis not present

## 2019-02-23 DIAGNOSIS — C50919 Malignant neoplasm of unspecified site of unspecified female breast: Secondary | ICD-10-CM

## 2019-02-23 DIAGNOSIS — Z170 Estrogen receptor positive status [ER+]: Secondary | ICD-10-CM | POA: Insufficient documentation

## 2019-02-23 DIAGNOSIS — Z79899 Other long term (current) drug therapy: Secondary | ICD-10-CM | POA: Insufficient documentation

## 2019-02-23 DIAGNOSIS — Z803 Family history of malignant neoplasm of breast: Secondary | ICD-10-CM | POA: Insufficient documentation

## 2019-02-23 DIAGNOSIS — Z7951 Long term (current) use of inhaled steroids: Secondary | ICD-10-CM | POA: Insufficient documentation

## 2019-02-23 DIAGNOSIS — I10 Essential (primary) hypertension: Secondary | ICD-10-CM | POA: Diagnosis not present

## 2019-02-23 DIAGNOSIS — Z5112 Encounter for antineoplastic immunotherapy: Secondary | ICD-10-CM | POA: Diagnosis not present

## 2019-02-23 DIAGNOSIS — C7800 Secondary malignant neoplasm of unspecified lung: Secondary | ICD-10-CM | POA: Diagnosis not present

## 2019-02-23 DIAGNOSIS — Z86718 Personal history of other venous thrombosis and embolism: Secondary | ICD-10-CM | POA: Diagnosis not present

## 2019-02-23 DIAGNOSIS — E119 Type 2 diabetes mellitus without complications: Secondary | ICD-10-CM | POA: Diagnosis not present

## 2019-02-23 DIAGNOSIS — Z87891 Personal history of nicotine dependence: Secondary | ICD-10-CM | POA: Insufficient documentation

## 2019-02-23 LAB — COMPREHENSIVE METABOLIC PANEL
ALT: 38 U/L (ref 0–44)
AST: 23 U/L (ref 15–41)
Albumin: 3.2 g/dL — ABNORMAL LOW (ref 3.5–5.0)
Alkaline Phosphatase: 130 U/L — ABNORMAL HIGH (ref 38–126)
Anion gap: 9 (ref 5–15)
BUN: 17 mg/dL (ref 8–23)
CO2: 30 mmol/L (ref 22–32)
Calcium: 9.3 mg/dL (ref 8.9–10.3)
Chloride: 103 mmol/L (ref 98–111)
Creatinine, Ser: 0.87 mg/dL (ref 0.44–1.00)
GFR calc Af Amer: >60 mL/min (ref >60)
GFR calc non Af Amer: >60 mL/min (ref >60)
Glucose, Bld: 183 mg/dL — ABNORMAL HIGH (ref 70–99)
Potassium: 4.3 mmol/L (ref 3.5–5.1)
Sodium: 142 mmol/L (ref 135–145)
Total Bilirubin: 0.2 mg/dL — ABNORMAL LOW (ref 0.3–1.2)
Total Protein: 7.4 g/dL (ref 6.5–8.1)

## 2019-02-23 LAB — CBC WITH DIFFERENTIAL/PLATELET
Abs Immature Granulocytes: 0.03 10*3/uL (ref 0.00–0.07)
Basophils Absolute: 0 10*3/uL (ref 0.0–0.1)
Basophils Relative: 1 %
Eosinophils Absolute: 0.2 10*3/uL (ref 0.0–0.5)
Eosinophils Relative: 3 %
HCT: 45.3 % (ref 36.0–46.0)
Hemoglobin: 14.1 g/dL (ref 12.0–15.0)
Immature Granulocytes: 1 %
Lymphocytes Relative: 32 %
Lymphs Abs: 2.1 10*3/uL (ref 0.7–4.0)
MCH: 28.1 pg (ref 26.0–34.0)
MCHC: 31.1 g/dL (ref 30.0–36.0)
MCV: 90.4 fL (ref 80.0–100.0)
Monocytes Absolute: 0.5 10*3/uL (ref 0.1–1.0)
Monocytes Relative: 7 %
Neutro Abs: 3.8 10*3/uL (ref 1.7–7.7)
Neutrophils Relative %: 56 %
Platelets: 241 10*3/uL (ref 150–400)
RBC: 5.01 MIL/uL (ref 3.87–5.11)
RDW: 13.8 % (ref 11.5–15.5)
WBC: 6.6 10*3/uL (ref 4.0–10.5)
nRBC: 0 % (ref 0.0–0.2)

## 2019-02-23 MED ORDER — LORAZEPAM 2 MG/ML IJ SOLN
0.5000 mg | Freq: Once | INTRAMUSCULAR | Status: AC
  Administered 2019-02-23: 0.5 mg via INTRAVENOUS

## 2019-02-23 MED ORDER — ACETAMINOPHEN 325 MG PO TABS
650.0000 mg | ORAL_TABLET | Freq: Once | ORAL | Status: AC
  Administered 2019-02-23: 650 mg via ORAL

## 2019-02-23 MED ORDER — ACETAMINOPHEN 325 MG PO TABS
ORAL_TABLET | ORAL | Status: AC
  Filled 2019-02-23: qty 2

## 2019-02-23 MED ORDER — DIPHENHYDRAMINE HCL 25 MG PO CAPS
ORAL_CAPSULE | ORAL | Status: AC
  Filled 2019-02-23: qty 1

## 2019-02-23 MED ORDER — TRASTUZUMAB CHEMO 150 MG IV SOLR
750.0000 mg | Freq: Once | INTRAVENOUS | Status: AC
  Administered 2019-02-23: 750 mg via INTRAVENOUS
  Filled 2019-02-23: qty 35.72

## 2019-02-23 MED ORDER — LORAZEPAM 2 MG/ML IJ SOLN
INTRAMUSCULAR | Status: AC
  Filled 2019-02-23: qty 1

## 2019-02-23 MED ORDER — SODIUM CHLORIDE 0.9% FLUSH
10.0000 mL | Freq: Once | INTRAVENOUS | Status: AC
  Administered 2019-02-23: 10 mL via INTRAVENOUS
  Filled 2019-02-23: qty 10

## 2019-02-23 MED ORDER — HEPARIN SOD (PORK) LOCK FLUSH 100 UNIT/ML IV SOLN
500.0000 [IU] | Freq: Once | INTRAVENOUS | Status: AC | PRN
  Administered 2019-02-23: 500 [IU]
  Filled 2019-02-23: qty 5

## 2019-02-23 MED ORDER — DIPHENHYDRAMINE HCL 25 MG PO CAPS
25.0000 mg | ORAL_CAPSULE | Freq: Once | ORAL | Status: AC
  Administered 2019-02-23: 25 mg via ORAL

## 2019-02-23 MED ORDER — SODIUM CHLORIDE 0.9 % IV SOLN
Freq: Once | INTRAVENOUS | Status: AC
  Administered 2019-02-23: 10:00:00 via INTRAVENOUS
  Filled 2019-02-23: qty 250

## 2019-02-23 NOTE — Patient Instructions (Signed)
[facility] Discharge Instructions for Patients Receiving Chemotherapy  Today you received the following Immunotherapy agent: Herceptin  To help prevent nausea and vomiting after your treatment, we encourage you to take your nausea medication as directed by MD.   If you develop nausea and vomiting that is not controlled by your nausea medication, call the clinic.   BELOW ARE SYMPTOMS THAT SHOULD BE REPORTED IMMEDIATELY:  *FEVER GREATER THAN 100.5 F  *CHILLS WITH OR WITHOUT FEVER  NAUSEA AND VOMITING THAT IS NOT CONTROLLED WITH YOUR NAUSEA MEDICATION  *UNUSUAL SHORTNESS OF BREATH  *UNUSUAL BRUISING OR BLEEDING  TENDERNESS IN MOUTH AND THROAT WITH OR WITHOUT PRESENCE OF ULCERS  *URINARY PROBLEMS  *BOWEL PROBLEMS  UNUSUAL RASH Items with * indicate a potential emergency and should be followed up as soon as possible.  Feel free to call the clinic should you have any questions or concerns. The clinic phone number is [phone].  Please show the [place] at check-in to the Emergency Department and triage nurse.  Coronavirus (COVID-19) Are you at risk?  Are you at risk for the Coronavirus (COVID-19)?  To be considered HIGH RISK for Coronavirus (COVID-19), you have to meet the following criteria:  . Traveled to [place], [place], [place], [place] or [place]; or in the [place] to [place], [place], [place], or [place]; and have fever, cough, and shortness of breath within the last 2 weeks of travel OR . Been in close contact with a person diagnosed with COVID-19 within the last 2 weeks and have fever, cough, and shortness of breath . IF YOU DO NOT MEET THESE CRITERIA, YOU ARE CONSIDERED LOW RISK FOR COVID-19.  What to do if you are HIGH RISK for COVID-19?  Marland Kitchen If you are having a medical emergency, call 911. . Seek medical care right away. Before you go to a doctor's office, urgent care or emergency department, call ahead and tell them about  your recent travel, contact with someone diagnosed with COVID-19, and your symptoms. You should receive instructions from your physician's office regarding next steps of care.  . When you arrive at healthcare provider, tell the healthcare staff immediately you have returned from visiting [place], [place], [place], [place] or [place]; or traveled in the [place] to [place], [place], [place], or [place]; in the last two weeks or you have been in close contact with a person diagnosed with COVID-19 in the last 2 weeks.   . Tell the health care staff about your symptoms: fever, cough and shortness of breath. . After you have been seen by a medical provider, you will be either: o Tested for (COVID-19) and discharged home on quarantine except to seek medical care if symptoms worsen, and asked to  - Stay home and avoid contact with others until you get your results (4-5 days)  - Avoid travel on public transportation if possible (such as bus, train, or airplane) or o Sent to the Emergency Department by EMS for evaluation, COVID-19 testing, and possible admission depending on your condition and test results.  What to do if you are LOW RISK for COVID-19?  Reduce your risk of any infection by using the same precautions used for avoiding the common cold or flu:  Marland Kitchen Wash your hands often with soap and warm water for at least 20 seconds.  If soap and water are not readily available, use an alcohol-based hand sanitizer with at least 60% alcohol.  . If coughing  or sneezing, cover your mouth and nose by coughing or sneezing into the elbow areas of your shirt or coat, into a tissue or into your sleeve (not your hands). . Avoid shaking hands with others and consider head nods or verbal greetings only. . Avoid touching your eyes, nose, or mouth with unwashed hands.  . Avoid close contact with people who are sick. . Avoid places or events with large numbers of people in one location, like concerts or sporting  events. . Carefully consider travel plans you have or are making. . If you are planning any travel outside or inside the [place], visit the CDC's Travelers' Health webpage for the latest health notices. . If you have some symptoms but not all symptoms, continue to monitor at home and seek medical attention if your symptoms worsen. . If you are having a medical emergency, call 911.   [place] / e-Visit: [email or web address]         [facility]: [facility]: [phone]                   [facility]: [phone]

## 2019-02-23 NOTE — Progress Notes (Signed)
Per Dr Jana Hakim OK to proceed with Herceptin today with echo results from Nov 2019

## 2019-02-24 LAB — CANCER ANTIGEN 27.29: CA 27.29: 21.2 U/mL (ref 0.0–38.6)

## 2019-03-01 DIAGNOSIS — I5020 Unspecified systolic (congestive) heart failure: Secondary | ICD-10-CM | POA: Diagnosis not present

## 2019-03-03 ENCOUNTER — Other Ambulatory Visit (HOSPITAL_COMMUNITY): Payer: Medicare Other

## 2019-03-07 ENCOUNTER — Other Ambulatory Visit: Payer: Self-pay

## 2019-03-07 ENCOUNTER — Ambulatory Visit (HOSPITAL_COMMUNITY)
Admission: RE | Admit: 2019-03-07 | Discharge: 2019-03-07 | Disposition: A | Discharge status: Home / Self Care | Payer: Medicare Other | Source: Ambulatory Visit | Attending: Adult Health | Admitting: Adult Health

## 2019-03-07 DIAGNOSIS — I10 Essential (primary) hypertension: Secondary | ICD-10-CM | POA: Insufficient documentation

## 2019-03-07 DIAGNOSIS — C7800 Secondary malignant neoplasm of unspecified lung: Secondary | ICD-10-CM | POA: Insufficient documentation

## 2019-03-07 DIAGNOSIS — C50919 Malignant neoplasm of unspecified site of unspecified female breast: Secondary | ICD-10-CM | POA: Diagnosis not present

## 2019-03-07 DIAGNOSIS — Z87891 Personal history of nicotine dependence: Secondary | ICD-10-CM | POA: Diagnosis not present

## 2019-03-07 DIAGNOSIS — Z170 Estrogen receptor positive status [ER+]: Secondary | ICD-10-CM | POA: Diagnosis not present

## 2019-03-07 DIAGNOSIS — Z6841 Body Mass Index (BMI) 40.0 and over, adult: Secondary | ICD-10-CM | POA: Insufficient documentation

## 2019-03-07 DIAGNOSIS — C50212 Malignant neoplasm of upper-inner quadrant of left female breast: Secondary | ICD-10-CM | POA: Insufficient documentation

## 2019-03-07 DIAGNOSIS — G8929 Other chronic pain: Secondary | ICD-10-CM | POA: Diagnosis not present

## 2019-03-07 DIAGNOSIS — E119 Type 2 diabetes mellitus without complications: Secondary | ICD-10-CM | POA: Diagnosis not present

## 2019-03-07 DIAGNOSIS — M549 Dorsalgia, unspecified: Secondary | ICD-10-CM | POA: Diagnosis not present

## 2019-03-07 NOTE — Progress Notes (Signed)
  Echocardiogram 2D Echocardiogram has been performed.  [name] 03/07/2019, 10:19 AM

## 2019-03-22 ENCOUNTER — Other Ambulatory Visit: Payer: Self-pay | Admitting: Oncology

## 2019-03-23 ENCOUNTER — Other Ambulatory Visit: Payer: Self-pay

## 2019-03-23 ENCOUNTER — Inpatient Hospital Stay: Payer: Medicare Other

## 2019-03-23 ENCOUNTER — Inpatient Hospital Stay: Payer: Medicare Other | Attending: Oncology

## 2019-03-23 VITALS — BP 144/71 | HR 55 | Temp 98.5°F | Resp 18

## 2019-03-23 DIAGNOSIS — C7800 Secondary malignant neoplasm of unspecified lung: Secondary | ICD-10-CM

## 2019-03-23 DIAGNOSIS — Z5112 Encounter for antineoplastic immunotherapy: Secondary | ICD-10-CM | POA: Insufficient documentation

## 2019-03-23 DIAGNOSIS — C50212 Malignant neoplasm of upper-inner quadrant of left female breast: Secondary | ICD-10-CM | POA: Insufficient documentation

## 2019-03-23 DIAGNOSIS — Z170 Estrogen receptor positive status [ER+]: Secondary | ICD-10-CM

## 2019-03-23 DIAGNOSIS — C50919 Malignant neoplasm of unspecified site of unspecified female breast: Secondary | ICD-10-CM

## 2019-03-23 LAB — COMPREHENSIVE METABOLIC PANEL
ALT: 27 U/L (ref 0–44)
AST: 19 U/L (ref 15–41)
Albumin: 3.3 g/dL — ABNORMAL LOW (ref 3.5–5.0)
Alkaline Phosphatase: 103 U/L (ref 38–126)
Anion gap: 10 (ref 5–15)
BUN: 17 mg/dL (ref 8–23)
CO2: 29 mmol/L (ref 22–32)
Calcium: 9.7 mg/dL (ref 8.9–10.3)
Chloride: 104 mmol/L (ref 98–111)
Creatinine, Ser: 0.74 mg/dL (ref 0.44–1.00)
GFR calc Af Amer: >60 mL/min (ref >60)
GFR calc non Af Amer: >60 mL/min (ref >60)
Glucose, Bld: 84 mg/dL (ref 70–99)
Potassium: 4.5 mmol/L (ref 3.5–5.1)
Sodium: 143 mmol/L (ref 135–145)
Total Bilirubin: 0.3 mg/dL (ref 0.3–1.2)
Total Protein: 7.4 g/dL (ref 6.5–8.1)

## 2019-03-23 LAB — CBC WITH DIFFERENTIAL/PLATELET
Abs Immature Granulocytes: 0.01 10*3/uL (ref 0.00–0.07)
Basophils Absolute: 0 10*3/uL (ref 0.0–0.1)
Basophils Relative: 1 %
Eosinophils Absolute: 0.2 10*3/uL (ref 0.0–0.5)
Eosinophils Relative: 3 %
HCT: 43.4 % (ref 36.0–46.0)
Hemoglobin: 13.9 g/dL (ref 12.0–15.0)
Immature Granulocytes: 0 %
Lymphocytes Relative: 34 %
Lymphs Abs: 2 10*3/uL (ref 0.7–4.0)
MCH: 28.4 pg (ref 26.0–34.0)
MCHC: 32 g/dL (ref 30.0–36.0)
MCV: 88.6 fL (ref 80.0–100.0)
Monocytes Absolute: 0.4 10*3/uL (ref 0.1–1.0)
Monocytes Relative: 7 %
Neutro Abs: 3.2 10*3/uL (ref 1.7–7.7)
Neutrophils Relative %: 55 %
Platelets: 236 10*3/uL (ref 150–400)
RBC: 4.9 MIL/uL (ref 3.87–5.11)
RDW: 13.5 % (ref 11.5–15.5)
WBC: 5.8 10*3/uL (ref 4.0–10.5)
nRBC: 0 % (ref 0.0–0.2)

## 2019-03-23 MED ORDER — HEPARIN SOD (PORK) LOCK FLUSH 100 UNIT/ML IV SOLN
500.0000 [IU] | Freq: Once | INTRAVENOUS | Status: AC | PRN
  Administered 2019-03-23: 500 [IU]
  Filled 2019-03-23: qty 5

## 2019-03-23 MED ORDER — DIPHENHYDRAMINE HCL 25 MG PO CAPS
25.0000 mg | ORAL_CAPSULE | Freq: Once | ORAL | Status: AC
  Administered 2019-03-23: 25 mg via ORAL

## 2019-03-23 MED ORDER — LORAZEPAM 2 MG/ML IJ SOLN
INTRAMUSCULAR | Status: AC
  Filled 2019-03-23: qty 1

## 2019-03-23 MED ORDER — ACETAMINOPHEN 325 MG PO TABS
650.0000 mg | ORAL_TABLET | Freq: Once | ORAL | Status: AC
  Administered 2019-03-23: 650 mg via ORAL

## 2019-03-23 MED ORDER — SODIUM CHLORIDE 0.9 % IV SOLN
Freq: Once | INTRAVENOUS | Status: AC
  Administered 2019-03-23: 10:00:00 via INTRAVENOUS
  Filled 2019-03-23: qty 250

## 2019-03-23 MED ORDER — LORAZEPAM 2 MG/ML IJ SOLN
0.5000 mg | Freq: Once | INTRAMUSCULAR | Status: AC
  Administered 2019-03-23: 0.5 mg via INTRAVENOUS

## 2019-03-23 MED ORDER — ACETAMINOPHEN 325 MG PO TABS
ORAL_TABLET | ORAL | Status: AC
  Filled 2019-03-23: qty 2

## 2019-03-23 MED ORDER — TRASTUZUMAB CHEMO 150 MG IV SOLR
750.0000 mg | Freq: Once | INTRAVENOUS | Status: AC
  Administered 2019-03-23: 750 mg via INTRAVENOUS
  Filled 2019-03-23: qty 35.72

## 2019-03-23 MED ORDER — SODIUM CHLORIDE 0.9% FLUSH
10.0000 mL | Freq: Once | INTRAVENOUS | Status: AC
  Administered 2019-03-23: 10 mL via INTRAVENOUS
  Filled 2019-03-23: qty 10

## 2019-03-23 MED ORDER — DIPHENHYDRAMINE HCL 25 MG PO CAPS
ORAL_CAPSULE | ORAL | Status: AC
  Filled 2019-03-23: qty 1

## 2019-03-23 NOTE — Patient Instructions (Signed)
[facility] Discharge Instructions for Patients Receiving Chemotherapy  Today you received the following Immunotherapy agent: Herceptin  To help prevent nausea and vomiting after your treatment, we encourage you to take your nausea medication as directed by MD.   If you develop nausea and vomiting that is not controlled by your nausea medication, call the clinic.   BELOW ARE SYMPTOMS THAT SHOULD BE REPORTED IMMEDIATELY:  *FEVER GREATER THAN 100.5 F  *CHILLS WITH OR WITHOUT FEVER  NAUSEA AND VOMITING THAT IS NOT CONTROLLED WITH YOUR NAUSEA MEDICATION  *UNUSUAL SHORTNESS OF BREATH  *UNUSUAL BRUISING OR BLEEDING  TENDERNESS IN MOUTH AND THROAT WITH OR WITHOUT PRESENCE OF ULCERS  *URINARY PROBLEMS  *BOWEL PROBLEMS  UNUSUAL RASH Items with * indicate a potential emergency and should be followed up as soon as possible.  Feel free to call the clinic should you have any questions or concerns. The clinic phone number is [phone].  Please show the [place] at check-in to the Emergency Department and triage nurse.  Coronavirus (COVID-19) Are you at risk?  Are you at risk for the Coronavirus (COVID-19)?  To be considered HIGH RISK for Coronavirus (COVID-19), you have to meet the following criteria:  . Traveled to [place], [place], [place], [place] or [place]; or in the [place] to [place], [place], [place], or [place]; and have fever, cough, and shortness of breath within the last 2 weeks of travel OR . Been in close contact with a person diagnosed with COVID-19 within the last 2 weeks and have fever, cough, and shortness of breath . IF YOU DO NOT MEET THESE CRITERIA, YOU ARE CONSIDERED LOW RISK FOR COVID-19.  What to do if you are HIGH RISK for COVID-19?  Marland Kitchen If you are having a medical emergency, call 911. . Seek medical care right away. Before you go to a doctor's office, urgent care or emergency department, call ahead and tell them about  your recent travel, contact with someone diagnosed with COVID-19, and your symptoms. You should receive instructions from your physician's office regarding next steps of care.  . When you arrive at healthcare provider, tell the healthcare staff immediately you have returned from visiting [place], [place], [place], [place] or [place]; or traveled in the [place] to [place], [place], [place], or [place]; in the last two weeks or you have been in close contact with a person diagnosed with COVID-19 in the last 2 weeks.   . Tell the health care staff about your symptoms: fever, cough and shortness of breath. . After you have been seen by a medical provider, you will be either: o Tested for (COVID-19) and discharged home on quarantine except to seek medical care if symptoms worsen, and asked to  - Stay home and avoid contact with others until you get your results (4-5 days)  - Avoid travel on public transportation if possible (such as bus, train, or airplane) or o Sent to the Emergency Department by EMS for evaluation, COVID-19 testing, and possible admission depending on your condition and test results.  What to do if you are LOW RISK for COVID-19?  Reduce your risk of any infection by using the same precautions used for avoiding the common cold or flu:  Marland Kitchen Wash your hands often with soap and warm water for at least 20 seconds.  If soap and water are not readily available, use an alcohol-based hand sanitizer with at least 60% alcohol.  . If coughing  or sneezing, cover your mouth and nose by coughing or sneezing into the elbow areas of your shirt or coat, into a tissue or into your sleeve (not your hands). . Avoid shaking hands with others and consider head nods or verbal greetings only. . Avoid touching your eyes, nose, or mouth with unwashed hands.  . Avoid close contact with people who are sick. . Avoid places or events with large numbers of people in one location, like concerts or sporting  events. . Carefully consider travel plans you have or are making. . If you are planning any travel outside or inside the [place], visit the CDC's Travelers' Health webpage for the latest health notices. . If you have some symptoms but not all symptoms, continue to monitor at home and seek medical attention if your symptoms worsen. . If you are having a medical emergency, call 911.   [place] / e-Visit: [email or web address]         [facility]: [facility]: [phone]                   [facility]: [phone]

## 2019-03-24 LAB — CANCER ANTIGEN 27.29: CA 27.29: 18 U/mL (ref 0.0–38.6)

## 2019-04-01 DIAGNOSIS — I5020 Unspecified systolic (congestive) heart failure: Secondary | ICD-10-CM | POA: Diagnosis not present

## 2019-04-18 ENCOUNTER — Other Ambulatory Visit: Payer: Self-pay | Admitting: Oncology

## 2019-04-20 ENCOUNTER — Inpatient Hospital Stay: Payer: Medicare Other | Attending: Oncology

## 2019-04-20 ENCOUNTER — Other Ambulatory Visit: Payer: Self-pay

## 2019-04-20 ENCOUNTER — Inpatient Hospital Stay: Payer: Medicare Other

## 2019-04-20 VITALS — BP 173/60 | HR 62 | Temp 98.2°F | Resp 16

## 2019-04-20 DIAGNOSIS — Z170 Estrogen receptor positive status [ER+]: Secondary | ICD-10-CM

## 2019-04-20 DIAGNOSIS — C50212 Malignant neoplasm of upper-inner quadrant of left female breast: Secondary | ICD-10-CM

## 2019-04-20 DIAGNOSIS — C7800 Secondary malignant neoplasm of unspecified lung: Secondary | ICD-10-CM

## 2019-04-20 DIAGNOSIS — Z5112 Encounter for antineoplastic immunotherapy: Secondary | ICD-10-CM | POA: Diagnosis not present

## 2019-04-20 DIAGNOSIS — C50919 Malignant neoplasm of unspecified site of unspecified female breast: Secondary | ICD-10-CM

## 2019-04-20 LAB — CBC WITH DIFFERENTIAL/PLATELET
Abs Immature Granulocytes: 0.02 10*3/uL (ref 0.00–0.07)
Basophils Absolute: 0 10*3/uL (ref 0.0–0.1)
Basophils Relative: 0 %
Eosinophils Absolute: 0.2 10*3/uL (ref 0.0–0.5)
Eosinophils Relative: 2 %
HCT: 43.7 % (ref 36.0–46.0)
Hemoglobin: 13.9 g/dL (ref 12.0–15.0)
Immature Granulocytes: 0 %
Lymphocytes Relative: 38 %
Lymphs Abs: 2.3 10*3/uL (ref 0.7–4.0)
MCH: 28.4 pg (ref 26.0–34.0)
MCHC: 31.8 g/dL (ref 30.0–36.0)
MCV: 89.2 fL (ref 80.0–100.0)
Monocytes Absolute: 0.4 10*3/uL (ref 0.1–1.0)
Monocytes Relative: 6 %
Neutro Abs: 3.3 10*3/uL (ref 1.7–7.7)
Neutrophils Relative %: 54 %
Platelets: 247 10*3/uL (ref 150–400)
RBC: 4.9 MIL/uL (ref 3.87–5.11)
RDW: 13.4 % (ref 11.5–15.5)
WBC: 6.2 10*3/uL (ref 4.0–10.5)
nRBC: 0 % (ref 0.0–0.2)

## 2019-04-20 LAB — COMPREHENSIVE METABOLIC PANEL
ALT: 30 U/L (ref 0–44)
AST: 18 U/L (ref 15–41)
Albumin: 3.3 g/dL — ABNORMAL LOW (ref 3.5–5.0)
Alkaline Phosphatase: 106 U/L (ref 38–126)
Anion gap: 10 (ref 5–15)
BUN: 18 mg/dL (ref 8–23)
CO2: 31 mmol/L (ref 22–32)
Calcium: 9.6 mg/dL (ref 8.9–10.3)
Chloride: 100 mmol/L (ref 98–111)
Creatinine, Ser: 0.82 mg/dL (ref 0.44–1.00)
GFR calc Af Amer: >60 mL/min (ref >60)
GFR calc non Af Amer: >60 mL/min (ref >60)
Glucose, Bld: 165 mg/dL — ABNORMAL HIGH (ref 70–99)
Potassium: 4.4 mmol/L (ref 3.5–5.1)
Sodium: 141 mmol/L (ref 135–145)
Total Bilirubin: 0.3 mg/dL (ref 0.3–1.2)
Total Protein: 7.2 g/dL (ref 6.5–8.1)

## 2019-04-20 MED ORDER — SODIUM CHLORIDE 0.9 % IV SOLN
Freq: Once | INTRAVENOUS | Status: AC
  Administered 2019-04-20: 09:00:00 via INTRAVENOUS
  Filled 2019-04-20: qty 250

## 2019-04-20 MED ORDER — TRASTUZUMAB-ANNS CHEMO 150 MG IV SOLR
750.0000 mg | Freq: Once | INTRAVENOUS | Status: AC
  Administered 2019-04-20: 750 mg via INTRAVENOUS
  Filled 2019-04-20: qty 35.72

## 2019-04-20 MED ORDER — SODIUM CHLORIDE 0.9% FLUSH
10.0000 mL | Freq: Once | INTRAVENOUS | Status: AC
  Administered 2019-04-20: 10 mL via INTRAVENOUS
  Filled 2019-04-20: qty 10

## 2019-04-20 MED ORDER — HEPARIN SOD (PORK) LOCK FLUSH 100 UNIT/ML IV SOLN
500.0000 [IU] | Freq: Once | INTRAVENOUS | Status: AC | PRN
  Administered 2019-04-20: 500 [IU]
  Filled 2019-04-20: qty 5

## 2019-04-20 MED ORDER — LORAZEPAM 2 MG/ML IJ SOLN
0.5000 mg | Freq: Once | INTRAMUSCULAR | Status: AC
  Administered 2019-04-20: 0.5 mg via INTRAVENOUS

## 2019-04-20 MED ORDER — LORAZEPAM 2 MG/ML IJ SOLN
INTRAMUSCULAR | Status: AC
  Filled 2019-04-20: qty 1

## 2019-04-20 MED ORDER — ACETAMINOPHEN 325 MG PO TABS
650.0000 mg | ORAL_TABLET | Freq: Once | ORAL | Status: AC
  Administered 2019-04-20: 650 mg via ORAL

## 2019-04-20 MED ORDER — DIPHENHYDRAMINE HCL 25 MG PO CAPS
25.0000 mg | ORAL_CAPSULE | Freq: Once | ORAL | Status: AC
  Administered 2019-04-20: 25 mg via ORAL

## 2019-04-20 MED ORDER — ACETAMINOPHEN 325 MG PO TABS
ORAL_TABLET | ORAL | Status: AC
  Filled 2019-04-20: qty 2

## 2019-04-20 MED ORDER — DIPHENHYDRAMINE HCL 25 MG PO CAPS
ORAL_CAPSULE | ORAL | Status: AC
  Filled 2019-04-20: qty 1

## 2019-04-20 NOTE — Patient Instructions (Signed)
[facility] Discharge Instructions for Patients Receiving Chemotherapy  Today you received the following Immunotherapy agent: Herceptin  To help prevent nausea and vomiting after your treatment, we encourage you to take your nausea medication as directed by MD.   If you develop nausea and vomiting that is not controlled by your nausea medication, call the clinic.   BELOW ARE SYMPTOMS THAT SHOULD BE REPORTED IMMEDIATELY:  *FEVER GREATER THAN 100.5 F  *CHILLS WITH OR WITHOUT FEVER  NAUSEA AND VOMITING THAT IS NOT CONTROLLED WITH YOUR NAUSEA MEDICATION  *UNUSUAL SHORTNESS OF BREATH  *UNUSUAL BRUISING OR BLEEDING  TENDERNESS IN MOUTH AND THROAT WITH OR WITHOUT PRESENCE OF ULCERS  *URINARY PROBLEMS  *BOWEL PROBLEMS  UNUSUAL RASH Items with * indicate a potential emergency and should be followed up as soon as possible.  Feel free to call the clinic should you have any questions or concerns. The clinic phone number is [phone].  Please show the [place] at check-in to the Emergency Department and triage nurse.  Coronavirus (COVID-19) Are you at risk?  Are you at risk for the Coronavirus (COVID-19)?  To be considered HIGH RISK for Coronavirus (COVID-19), you have to meet the following criteria:  . Traveled to [place], [place], [place], [place] or [place]; or in the [place] to [place], [place], [place], or [place]; and have fever, cough, and shortness of breath within the last 2 weeks of travel OR . Been in close contact with a person diagnosed with COVID-19 within the last 2 weeks and have fever, cough, and shortness of breath . IF YOU DO NOT MEET THESE CRITERIA, YOU ARE CONSIDERED LOW RISK FOR COVID-19.  What to do if you are HIGH RISK for COVID-19?  Marland Kitchen If you are having a medical emergency, call 911. . Seek medical care right away. Before you go to a doctor's office, urgent care or emergency department, call ahead and tell them about  your recent travel, contact with someone diagnosed with COVID-19, and your symptoms. You should receive instructions from your physician's office regarding next steps of care.  . When you arrive at healthcare provider, tell the healthcare staff immediately you have returned from visiting [place], [place], [place], [place] or [place]; or traveled in the [place] to [place], [place], [place], or [place]; in the last two weeks or you have been in close contact with a person diagnosed with COVID-19 in the last 2 weeks.   . Tell the health care staff about your symptoms: fever, cough and shortness of breath. . After you have been seen by a medical provider, you will be either: o Tested for (COVID-19) and discharged home on quarantine except to seek medical care if symptoms worsen, and asked to  - Stay home and avoid contact with others until you get your results (4-5 days)  - Avoid travel on public transportation if possible (such as bus, train, or airplane) or o Sent to the Emergency Department by EMS for evaluation, COVID-19 testing, and possible admission depending on your condition and test results.  What to do if you are LOW RISK for COVID-19?  Reduce your risk of any infection by using the same precautions used for avoiding the common cold or flu:  Marland Kitchen Wash your hands often with soap and warm water for at least 20 seconds.  If soap and water are not readily available, use an alcohol-based hand sanitizer with at least 60% alcohol.  . If coughing  or sneezing, cover your mouth and nose by coughing or sneezing into the elbow areas of your shirt or coat, into a tissue or into your sleeve (not your hands). . Avoid shaking hands with others and consider head nods or verbal greetings only. . Avoid touching your eyes, nose, or mouth with unwashed hands.  . Avoid close contact with people who are sick. . Avoid places or events with large numbers of people in one location, like concerts or sporting  events. . Carefully consider travel plans you have or are making. . If you are planning any travel outside or inside the [place], visit the CDC's Travelers' Health webpage for the latest health notices. . If you have some symptoms but not all symptoms, continue to monitor at home and seek medical attention if your symptoms worsen. . If you are having a medical emergency, call 911.   [place] / e-Visit: [email or web address]         [facility]: [facility]: [phone]                   [facility]: [phone]

## 2019-04-21 LAB — CANCER ANTIGEN 27.29: CA 27.29: 16.9 U/mL (ref 0.0–38.6)

## 2019-05-02 DIAGNOSIS — I5020 Unspecified systolic (congestive) heart failure: Secondary | ICD-10-CM | POA: Diagnosis not present

## 2019-05-18 ENCOUNTER — Inpatient Hospital Stay: Payer: Medicare Other

## 2019-05-18 ENCOUNTER — Other Ambulatory Visit: Payer: Self-pay | Admitting: Oncology

## 2019-05-18 ENCOUNTER — Other Ambulatory Visit: Payer: Self-pay

## 2019-05-18 ENCOUNTER — Inpatient Hospital Stay (HOSPITAL_BASED_OUTPATIENT_CLINIC_OR_DEPARTMENT_OTHER): Payer: Medicare Other | Admitting: Medical

## 2019-05-18 VITALS — BP 149/62 | HR 64 | Temp 98.2°F | Resp 18 | Wt 299.0 lb

## 2019-05-18 DIAGNOSIS — C50919 Malignant neoplasm of unspecified site of unspecified female breast: Secondary | ICD-10-CM

## 2019-05-18 DIAGNOSIS — C50212 Malignant neoplasm of upper-inner quadrant of left female breast: Secondary | ICD-10-CM

## 2019-05-18 DIAGNOSIS — Z170 Estrogen receptor positive status [ER+]: Secondary | ICD-10-CM

## 2019-05-18 DIAGNOSIS — H6690 Otitis media, unspecified, unspecified ear: Secondary | ICD-10-CM

## 2019-05-18 DIAGNOSIS — Z5112 Encounter for antineoplastic immunotherapy: Secondary | ICD-10-CM | POA: Diagnosis not present

## 2019-05-18 DIAGNOSIS — C7800 Secondary malignant neoplasm of unspecified lung: Secondary | ICD-10-CM

## 2019-05-18 LAB — CBC WITH DIFFERENTIAL/PLATELET
Abs Immature Granulocytes: 0.01 10*3/uL (ref 0.00–0.07)
Basophils Absolute: 0 10*3/uL (ref 0.0–0.1)
Basophils Relative: 0 %
Eosinophils Absolute: 0.1 10*3/uL (ref 0.0–0.5)
Eosinophils Relative: 2 %
HCT: 43.3 % (ref 36.0–46.0)
Hemoglobin: 13.9 g/dL (ref 12.0–15.0)
Immature Granulocytes: 0 %
Lymphocytes Relative: 37 %
Lymphs Abs: 2.2 10*3/uL (ref 0.7–4.0)
MCH: 28.5 pg (ref 26.0–34.0)
MCHC: 32.1 g/dL (ref 30.0–36.0)
MCV: 88.7 fL (ref 80.0–100.0)
Monocytes Absolute: 0.3 10*3/uL (ref 0.1–1.0)
Monocytes Relative: 6 %
Neutro Abs: 3.2 10*3/uL (ref 1.7–7.7)
Neutrophils Relative %: 55 %
Platelets: 240 10*3/uL (ref 150–400)
RBC: 4.88 MIL/uL (ref 3.87–5.11)
RDW: 13.7 % (ref 11.5–15.5)
WBC: 5.8 10*3/uL (ref 4.0–10.5)
nRBC: 0 % (ref 0.0–0.2)

## 2019-05-18 LAB — COMPREHENSIVE METABOLIC PANEL
ALT: 25 U/L (ref 0–44)
AST: 13 U/L — ABNORMAL LOW (ref 15–41)
Albumin: 3.3 g/dL — ABNORMAL LOW (ref 3.5–5.0)
Alkaline Phosphatase: 102 U/L (ref 38–126)
Anion gap: 10 (ref 5–15)
BUN: 28 mg/dL — ABNORMAL HIGH (ref 8–23)
CO2: 30 mmol/L (ref 22–32)
Calcium: 9.5 mg/dL (ref 8.9–10.3)
Chloride: 101 mmol/L (ref 98–111)
Creatinine, Ser: 0.91 mg/dL (ref 0.44–1.00)
GFR calc Af Amer: >60 mL/min (ref >60)
GFR calc non Af Amer: >60 mL/min (ref >60)
Glucose, Bld: 286 mg/dL — ABNORMAL HIGH (ref 70–99)
Potassium: 4.5 mmol/L (ref 3.5–5.1)
Sodium: 141 mmol/L (ref 135–145)
Total Bilirubin: 0.3 mg/dL (ref 0.3–1.2)
Total Protein: 7.1 g/dL (ref 6.5–8.1)

## 2019-05-18 MED ORDER — ACETAMINOPHEN 325 MG PO TABS
ORAL_TABLET | ORAL | Status: AC
  Filled 2019-05-18: qty 2

## 2019-05-18 MED ORDER — HEPARIN SOD (PORK) LOCK FLUSH 100 UNIT/ML IV SOLN
500.0000 [IU] | Freq: Once | INTRAVENOUS | Status: AC | PRN
  Administered 2019-05-18: 500 [IU]
  Filled 2019-05-18: qty 5

## 2019-05-18 MED ORDER — AZITHROMYCIN 250 MG PO TABS: ORAL_TABLET | ORAL | 0 refills | Status: DC

## 2019-05-18 MED ORDER — ACETAMINOPHEN 325 MG PO TABS
650.0000 mg | ORAL_TABLET | Freq: Once | ORAL | Status: AC
  Administered 2019-05-18: 650 mg via ORAL

## 2019-05-18 MED ORDER — LORAZEPAM 2 MG/ML IJ SOLN
INTRAMUSCULAR | Status: AC
  Filled 2019-05-18: qty 1

## 2019-05-18 MED ORDER — TRASTUZUMAB-ANNS CHEMO 150 MG IV SOLR
750.0000 mg | Freq: Once | INTRAVENOUS | Status: AC
  Administered 2019-05-18: 750 mg via INTRAVENOUS
  Filled 2019-05-18: qty 35.72

## 2019-05-18 MED ORDER — DIPHENHYDRAMINE HCL 25 MG PO CAPS
25.0000 mg | ORAL_CAPSULE | Freq: Once | ORAL | Status: AC
  Administered 2019-05-18: 25 mg via ORAL

## 2019-05-18 MED ORDER — LORAZEPAM 2 MG/ML IJ SOLN
0.5000 mg | Freq: Once | INTRAMUSCULAR | Status: AC
  Administered 2019-05-18: 0.5 mg via INTRAVENOUS

## 2019-05-18 MED ORDER — SODIUM CHLORIDE 0.9 % IV SOLN
Freq: Once | INTRAVENOUS | Status: AC
  Administered 2019-05-18: 10:00:00 via INTRAVENOUS
  Filled 2019-05-18: qty 250

## 2019-05-18 MED ORDER — SODIUM CHLORIDE 0.9% FLUSH
10.0000 mL | Freq: Once | INTRAVENOUS | Status: AC
  Administered 2019-05-18: 10 mL via INTRAVENOUS
  Filled 2019-05-18: qty 10

## 2019-05-18 MED ORDER — DIPHENHYDRAMINE HCL 25 MG PO CAPS
ORAL_CAPSULE | ORAL | Status: AC
  Filled 2019-05-18: qty 1

## 2019-05-18 NOTE — Progress Notes (Signed)
Pt brought to [facility] room 27 after infusion appt for assessment L ear.  Reports over last few days muffled sounds on L side, ear fullness & aching, and "thudding sounds" in L ear.  L ear canal shows cerumen and redness/irritation along inner canal, UTA eardrum d/t cerumen obstruction at this time.  PA Lucianne Lei aware.  Denies sinus pain/pressure, stuffiness, fever/chills, or resp symptoms.  Denies any other issues.

## 2019-05-18 NOTE — Patient Instructions (Signed)
[facility] °Discharge Instructions for Patients Receiving Chemotherapy ° °Today you received the following chemotherapy agents Trastuzumab ° °To help prevent nausea and vomiting after your treatment, we encourage you to take your nausea medication as directed. °  °If you develop nausea and vomiting that is not controlled by your nausea medication, call the clinic.  ° °BELOW ARE SYMPTOMS THAT SHOULD BE REPORTED IMMEDIATELY: °· *FEVER GREATER THAN 100.5 F °· *CHILLS WITH OR WITHOUT FEVER °· NAUSEA AND VOMITING THAT IS NOT CONTROLLED WITH YOUR NAUSEA MEDICATION °· *UNUSUAL SHORTNESS OF BREATH °· *UNUSUAL BRUISING OR BLEEDING °· TENDERNESS IN MOUTH AND THROAT WITH OR WITHOUT PRESENCE OF ULCERS °· *URINARY PROBLEMS °· *BOWEL PROBLEMS °· UNUSUAL RASH °Items with * indicate a potential emergency and should be followed up as soon as possible. ° °Feel free to call the clinic should you have any questions or concerns. The clinic phone number is [phone]. ° °Please show the CHEMO ALERT CARD at check-in to the Emergency Department and triage nurse. ° ° °

## 2019-05-18 NOTE — Patient Instructions (Addendum)
Otitis Media, Adult  Otitis media means that the middle ear is red and swollen (inflamed) and full of fluid. The condition usually goes away on its own. Follow these instructions at home:  Take over-the-counter and prescription medicines only as told by your doctor.  If you were prescribed an antibiotic medicine, take it as told by your doctor. Do not stop taking the antibiotic even if you start to feel better.  Keep all follow-up visits as told by your doctor. This is important. Contact a doctor if:  You have bleeding from your nose.  There is a lump on your neck.  You are not getting better in 5 days.  You feel worse instead of better. Get help right away if:  You have pain that is not helped with medicine.  You have swelling, redness, or pain around your ear.  You get a stiff neck.  You cannot move part of your face (paralyzed).  You notice that the bone behind your ear hurts when you touch it.  You get a very bad headache. Summary  Otitis media means that the middle ear is red, swollen, and full of fluid.  This condition usually goes away on its own. In some cases, treatment may be needed.  If you were prescribed an antibiotic medicine, take it as told by your doctor. This information is not intended to replace advice given to you by your health care provider. Make sure you discuss any questions you have with your health care provider. Document Released: 01/21/2008 Document Revised: 07/17/2017 Document Reviewed: 08/25/2016 Elsevier Patient Education  2020 Elsevier Inc.  

## 2019-05-18 NOTE — Progress Notes (Signed)
Patient c/o left ear pain, popping sound, afebrile, no dizziness or pressure. Contacted Dr. Jana Hakim and made aware and Sandi Mealy PA will assess patient in the infusion room today. Okay to treat.

## 2019-05-19 LAB — CANCER ANTIGEN 27.29: CA 27.29: 24.1 U/mL (ref 0.0–38.6)

## 2019-05-20 NOTE — Progress Notes (Signed)
[facility] Progress Note   [name] [id number] [birth year] 68 y.o.  [name] is managed by Dr. Lurline Del  Actively treated with chemotherapy/immunotherapy/hormonal therapy: yes  Current therapy: Kanjinti and letrozole  Last treated: 05/18/2019 (cycle 100)  Next scheduled appointment with provider: 06/15/2019  Assessment: Plan:    Acute otitis media, unspecified otitis media type - Plan: azithromycin (ZITHROMAX Z-PAK) 250 MG tablet  Malignant neoplasm of upper-inner quadrant of left breast in female, estrogen receptor positive ([place])   Otitis media of the left ear: The patient was given a prescription for a Z-Pak and was instructed to obtain over-the-counter Debrox.  ER positive malignant neoplasm of the left breast: The patient continues to be managed by Dr. Jana Hakim and is currently treated with Kanjinti and letrozole.  She is scheduled to be seen in follow-up on 06/15/2019.  Please see After Visit Summary for patient specific instructions.  Future Appointments  Date Time Provider [place] [facility]  06/15/2019  1:00 PM [facility] LAB 4 [facility] None  06/15/2019  1:30 PM Magrinat, Virgie Dad, MD [facility] None  06/15/2019  2:00 PM [facility] INFUSION [facility] None    No orders of the defined types were placed in this encounter.      Subjective:   Patient ID:  [name] is a 68 y.o. (DOB [birth year]) female.  Chief Complaint:  Chief Complaint  Patient presents with  . Ear Pain    HPI [name]  Is a 68 y.o. female with a diagnosis of an ER positive malignant neoplasm of the left breast.  She is managed by Dr. Jana Hakim and is currently treated with Kanjinti and letrozole.  She is receiving cycle 100 today.  She reports ongoing left ear pain.  She reports that her left preauricular lymph node is enlarged at times.  She also reports having tenderness over the left mastoid.  She denies fevers, chills, sweats, headaches, cough,  or other respiratory issues.  Medications: I have reviewed the patient's current medications.  Allergies:  Allergies  Allergen Reactions  . Meperidine Hcl Anaphylaxis  . Penicillins Anaphylaxis    Has patient had a PCN reaction causing immediate rash, facial/tongue/throat swelling, SOB or lightheadedness with hypotension: yes Has patient had a PCN reaction causing severe rash involving mucus membranes or skin necrosis: no Has patient had a PCN reaction that required hospitalization yes Has patient had a PCN reaction occurring within the last 10 years: no If all of the above answers are "NO", then may proceed with Cephalosporin use.   Marland Kitchen Amoxicillin     REACTION: unspecified  . Aspirin Nausea And Vomiting    REACTION: unspecified  . Percocet [Oxycodone-Acetaminophen]     Past Medical History:  Diagnosis Date  . Arthritis   . Back pain   . Breast cancer ([place]) dx'd 11/2012   left  . Chest pain   . Diabetes mellitus without complication ([place]) 10/23/6576  . Fatty liver 6/03  . Hypertension   . Lung disease   . Obesity, unspecified   . Other abnormal glucose   . Suicide attempt ([place]) 1996  . Syncope and collapse   . Unspecified sleep apnea     Past Surgical History:  Procedure Laterality Date  . CARDIAC CATHETERIZATION     2007  . CHOLECYSTECTOMY    . TUBAL LIGATION      Family History  Problem Relation Age of Onset  . Coronary artery disease Father 100  . Diabetes Father   .  Heart disease Father   . Breast cancer Mother 45  . Cancer Mother 65       breast  . Aplastic anemia Daughter        died at age 30  . Cancer Maternal Aunt 40       ovarian  . Cancer Maternal Grandmother 55       ovarian  . Cancer Paternal Aunt 95       ovarian/breast/breast  . Coronary artery disease Sister 35  . Coronary artery disease Brother 82    Social History   Socioeconomic History  . Marital status: Divorced    Spouse name: Not on file  . Number of children: 4  . Years of  education: some colle  . Highest education level: Not on file  Occupational History  . Occupation: Disability  . Occupation: retired-daycare  Social Needs  . Financial resource strain: Not on file  . Food insecurity    Worry: Not on file    Inability: Not on file  . Transportation needs    Medical: Not on file    Non-medical: Not on file  Tobacco Use  . Smoking status: Former Smoker    Packs/day: 3.00    Years: 5.00    Pack years: 15.00    Types: Cigarettes    Quit date: 08/18/1968    Years since quitting: 50.7  . Smokeless tobacco: Never Used  Substance and Sexual Activity  . Alcohol use: No    Alcohol/week: 0.0 standard drinks  . Drug use: No  . Sexual activity: Not Currently  Lifestyle  . Physical activity    Days per week: Not on file    Minutes per session: Not on file  . Stress: Not on file  Relationships  . Social Herbalist on phone: Not on file    Gets together: Not on file    Attends religious service: Not on file    Active member of club or organization: Not on file    Attends meetings of clubs or organizations: Not on file    Relationship status: Not on file  . Intimate partner violence    Fear of current or ex partner: Not on file    Emotionally abused: Not on file    Physically abused: Not on file    Forced sexual activity: Not on file  Other Topics Concern  . Not on file  Social History Narrative   Health Care POA:    Emergency Contact: Thomasenia Bottoms [phone]   End of Life Plan:    Who lives with you: two grandchildren, friend, daughter   Any pets: 3 poodles   Diet: Pt has a variety of protein, starch, vegetables.  Pt is currently working on cutting back on portions for weight loss.   Exercise: Pt has not regular exercise routine.  Occasionally walks around home.   Seatbelts: Pt reports wearing seatbelt occasionally.   Sun Exposure/Protection: Pt does not use sun protection   Hobbies: reading, playing on kindle, [employer]         Currently  in her home she keeps her granddaughter [name], 56, who is the daughter of the patient's daughter [name] (the patient refers to [name] as "my adopted daughter"); grandson Reber "[name]" [place], [place], who is [name]'s half-brother; [name], and an [place] friend, [name], the patient's significant other. [name] is a Network engineer, currently unemployed. Son [name] works as an Clinical biochemist in [place]. Daughter [name] is  currently in prison due to killing someone in a car accident. Daughter [name] died from aplastic anemia at the age of 11. The patient has a total of 4 grandchildren. She is not a Ambulance person    Past Medical History, Surgical history, Social history, and Family history were reviewed and updated as appropriate.   Please see review of systems for further details on the patient's review from today.   Review of Systems:  Review of Systems  Constitutional: Negative for chills, diaphoresis, fatigue and fever.  HENT: Positive for ear pain. Negative for congestion, postnasal drip, rhinorrhea and sore throat.   Respiratory: Negative for cough, shortness of breath and wheezing.   Cardiovascular: Negative for palpitations.  Neurological: Negative for headaches.    Objective:   Physical Exam:  There were no vitals taken for this visit. ECOG: 1  Physical Exam Constitutional:      General: She is not in acute distress.    Appearance: She is obese. She is not ill-appearing, toxic-appearing or diaphoretic.  HENT:     Head: Normocephalic and atraumatic.     Right Ear: Tympanic membrane and ear canal normal.     Left Ear: Ear canal normal.     Ears:     Comments: The right auditory canal was partially blocked with dark cerumen.  The left auditory canal was partially blocked with yellowish cerumen with the inferior anterior of the TM dull and hazy. Lymphadenopathy:     Head:     Right side of head: No submental,  tonsillar, preauricular, posterior auricular or occipital adenopathy.     Left side of head: No submental, tonsillar, preauricular, posterior auricular or occipital adenopathy.     Cervical: No cervical adenopathy.  Skin:    General: Skin is warm and dry.  Neurological:     Mental Status: She is alert.     Gait: Gait abnormal (The patient is ambulating with the use of a wheelchair.).  Psychiatric:        Mood and Affect: Mood normal.        Behavior: Behavior normal.        Thought Content: Thought content normal.        Judgment: Judgment normal.     Lab Review:     Component Value Date/Time   NA 141 05/18/2019 0828   NA 141 08/05/2017 0927   K 4.5 05/18/2019 0828   K 4.0 08/05/2017 0927   CL 101 05/18/2019 0828   CL 101 02/07/2013 1125   CO2 30 05/18/2019 0828   CO2 31 (H) 08/05/2017 0927   GLUCOSE 286 (H) 05/18/2019 0828   GLUCOSE 125 08/05/2017 0927   GLUCOSE 193 (H) 02/07/2013 1125   BUN 28 (H) 05/18/2019 0828   BUN 22.7 08/05/2017 0927   CREATININE 0.91 05/18/2019 0828   CREATININE 0.8 08/05/2017 0927   CALCIUM 9.5 05/18/2019 0828   CALCIUM 9.7 08/05/2017 0927   PROT 7.1 05/18/2019 0828   PROT 7.3 08/05/2017 0927   ALBUMIN 3.3 (L) 05/18/2019 0828   ALBUMIN 3.2 (L) 08/05/2017 0927   AST 13 (L) 05/18/2019 0828   AST 19 08/05/2017 0927   ALT 25 05/18/2019 0828   ALT 28 08/05/2017 0927   ALKPHOS 102 05/18/2019 0828   ALKPHOS 108 08/05/2017 0927   BILITOT 0.3 05/18/2019 0828   BILITOT 0.34 08/05/2017 0927   GFRNONAA >60 05/18/2019 0828   GFRAA >60 05/18/2019 0828       Component Value Date/Time   WBC 5.8  05/18/2019 0828   RBC 4.88 05/18/2019 0828   HGB 13.9 05/18/2019 0828   HGB 12.8 08/05/2017 0927   HCT 43.3 05/18/2019 0828   HCT 39.8 08/05/2017 0927   PLT 240 05/18/2019 0828   PLT 267 08/05/2017 0927   MCV 88.7 05/18/2019 0828   MCV 86.7 08/05/2017 0927   MCH 28.5 05/18/2019 0828   MCHC 32.1 05/18/2019 0828   RDW 13.7 05/18/2019 0828   RDW 14.3  08/05/2017 0927   LYMPHSABS 2.2 05/18/2019 0828   LYMPHSABS 2.3 08/05/2017 0927   MONOABS 0.3 05/18/2019 0828   MONOABS 0.4 08/05/2017 0927   EOSABS 0.1 05/18/2019 0828   EOSABS 0.1 08/05/2017 0927   BASOSABS 0.0 05/18/2019 0828   BASOSABS 0.0 08/05/2017 0927   -------------------------------  Imaging from last 24 hours (if applicable):  Radiology interpretation: No results found.

## 2019-05-28 ENCOUNTER — Other Ambulatory Visit: Payer: Self-pay | Admitting: Family Medicine

## 2019-05-28 IMAGING — CT CT CHEST W/ CM
2 of 3 series · 14 of 36 positions shown, 17 images · IV contrast (omnipaque)
Comparison: 06/10/2017 chest CT.

CLINICAL DATA: Metastatic left breast cancer.  Restaging.

EXAM:
CT CHEST WITH CONTRAST
TECHNIQUE: Multidetector CT imaging of the chest was performed during
intravenous contrast administration.
CONTRAST:  100mL OMNIPAQUE IOHEXOL 300 MG/ML  SOLN

[Series 2: axial st · axial · 0.73mm/px · z∈[+11,+239]mm · 11 of 134 slices shown, 14 images]
[im 10/134  mediastinal]
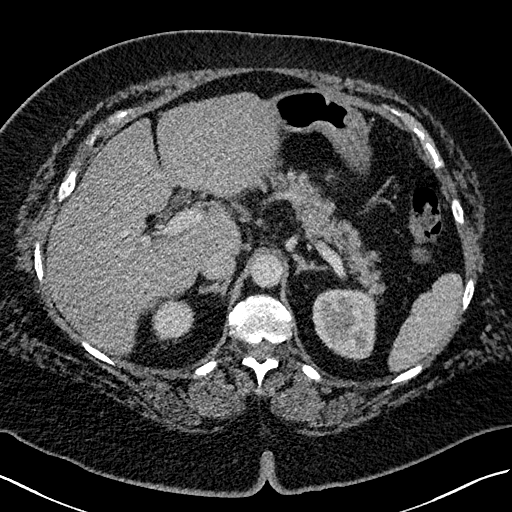
[im 10/134  lung]
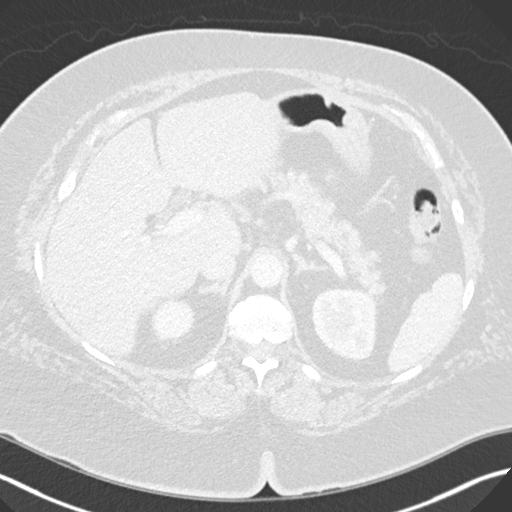
[im 20/134  lung]
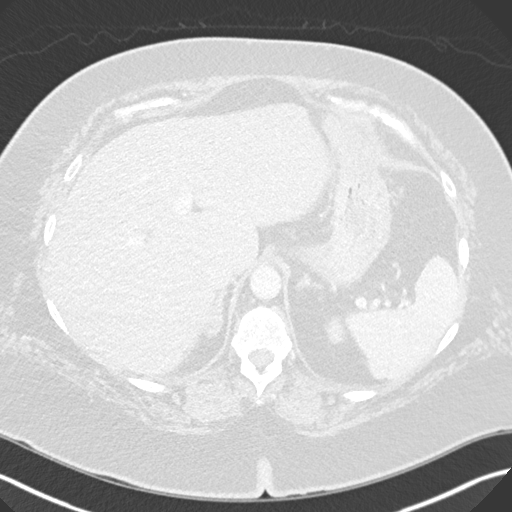
[im 30/134  lung]
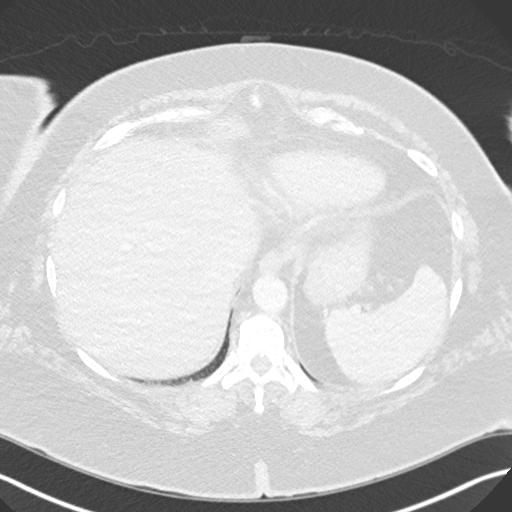
[im 45/134  lung]
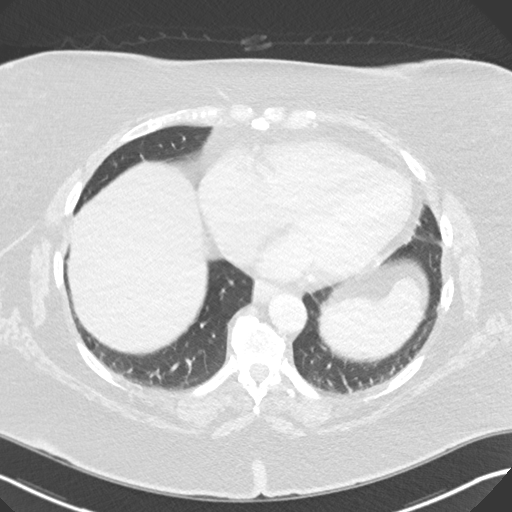
[im 55/134  mediastinal]
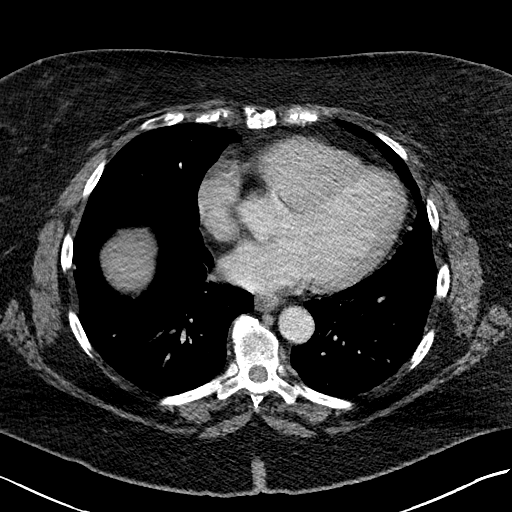
[im 55/134  lung]
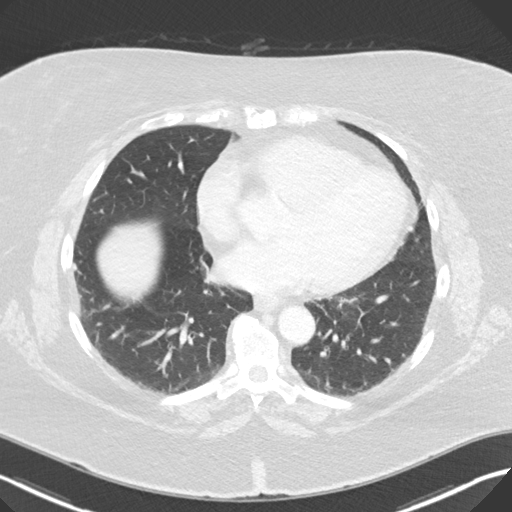
[im 69/134  lung]
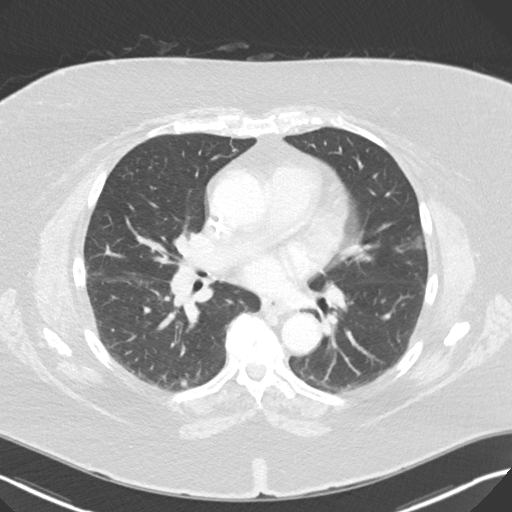
[im 79/134  lung]
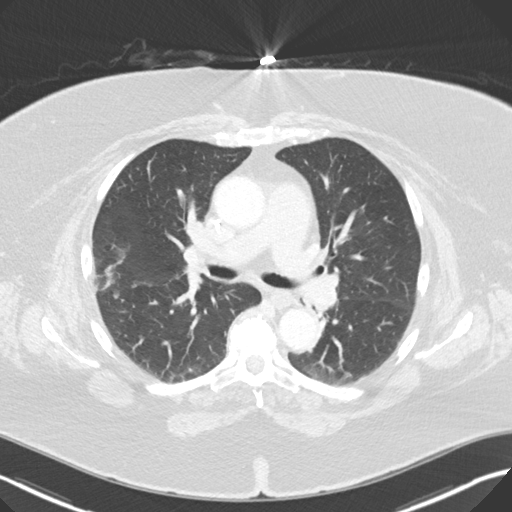
[im 89/134  lung]
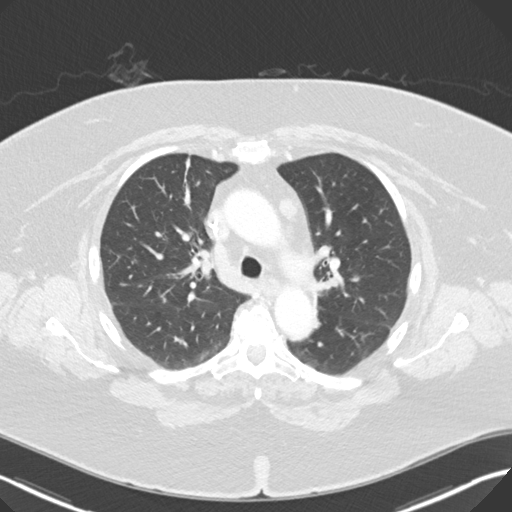
[im 104/134  mediastinal]
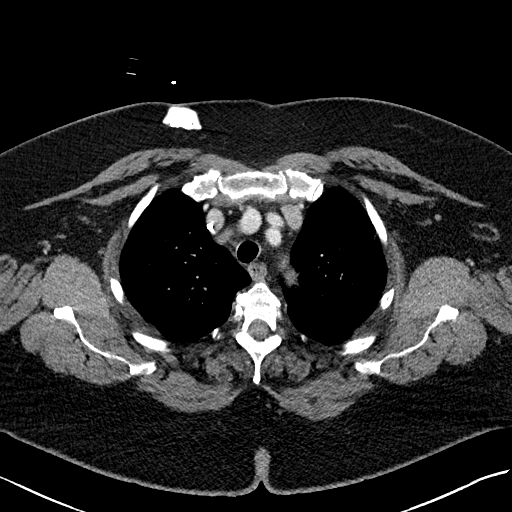
[im 104/134  lung]
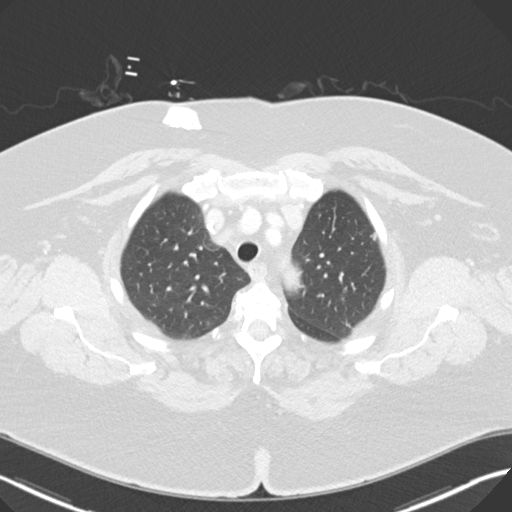
[im 114/134  lung]
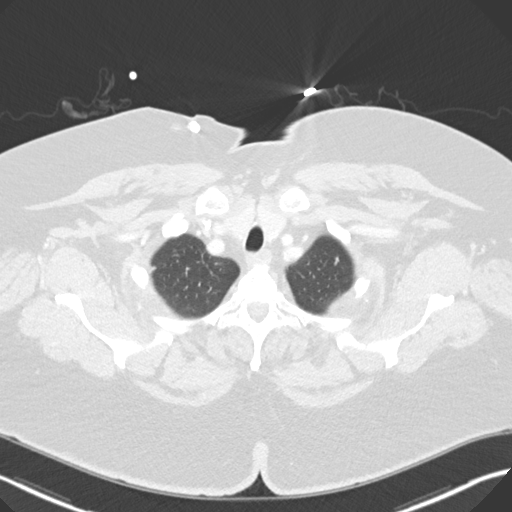
[im 124/134  lung]
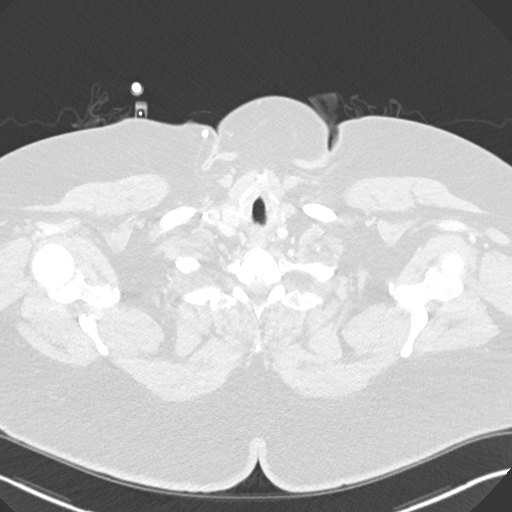

[Series 6: coronal · coronal · 0.56mm/px · 3 of 165 slices shown]
[im 33/165  lung]
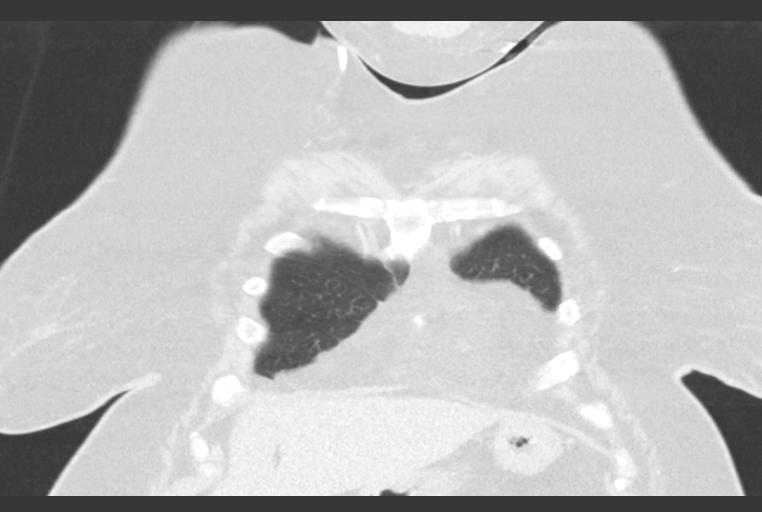
[im 66/165  lung]
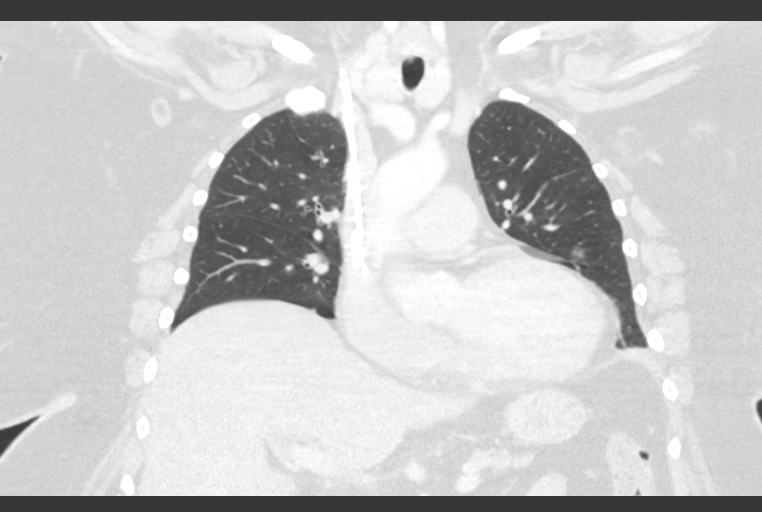
[im 99/165  lung]
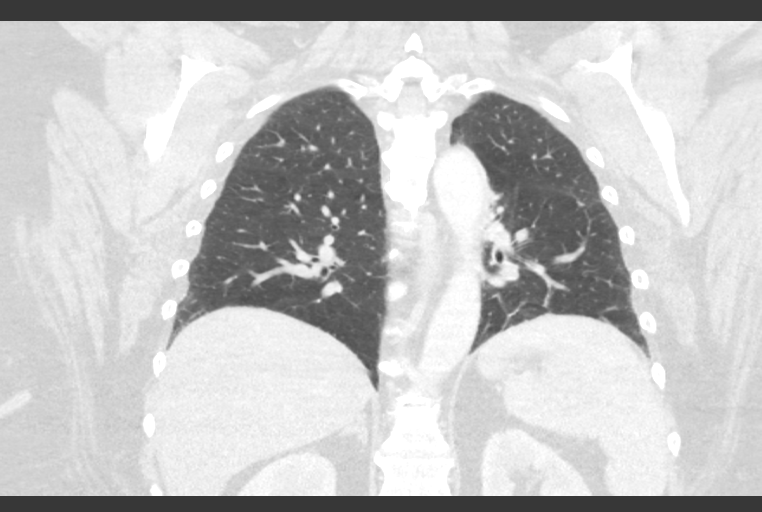

[14 of 36 positions shown; findings below may reference images not displayed]

FINDINGS: Cardiovascular: Mild cardiomegaly. No significant pericardial
effusion/thickening. Right internal jugular Port-A-Cath terminates
in the lower third of the SVC. Mildly atherosclerotic thoracic aorta
with stable ectatic 4.1 cm ascending thoracic aorta. Stable dilated
main pulmonary artery (3.5 cm diameter). No central pulmonary
emboli.

Mediastinum/Nodes: No discrete thyroid nodules. Unremarkable
esophagus. No axillary adenopathy. New enlarged 1.1 cm left
prevascular node (series 2/image 45). No additional pathologically
enlarged mediastinal nodes. No hilar adenopathy.

Lungs/Pleura: No pneumothorax. No pleural effusion. No acute
consolidative airspace disease or lung masses. Several scattered
solid and sub solid pulmonary nodules in both lungs, largest 4 mm in
the posterior right lower lobe (series 5/image 66), all unchanged.
No new significant pulmonary nodules. Stable small parenchymal bands
in the mid lungs bilaterally.

Upper abdomen: No acute abnormality.

Musculoskeletal: No aggressive appearing focal osseous lesions.
Moderate thoracic spondylosis.
IMPRESSION: 1. Newly mildly enlarged left prevascular lymph node, can not
exclude new nodal metastasis.
2. Otherwise no new or progressive metastatic disease in the chest.
Small bilateral pulmonary nodules are all stable.
3. Mild cardiomegaly.
4. Stable dilated main pulmonary artery suggesting chronic pulmonary
arterial hypertension.
5. Stable ectatic 4.1 cm ascending thoracic aorta. Recommend annual
imaging followup by CTA or MRA. This recommendation follows 7242
ACCF/AHA/AATS/ACR/ASA/SCA/GLASSMAN/SOFYA/BING/ALAY Guidelines for the
Diagnosis and Management of Patients with Thoracic Aortic Disease.
Circulation. 7242; 121: [id number]. Aortic aneurysm NOS
(4GZJV-87A.J).

Aortic Atherosclerosis (4GZJV-S45.5).

## 2019-06-01 DIAGNOSIS — I5020 Unspecified systolic (congestive) heart failure: Secondary | ICD-10-CM | POA: Diagnosis not present

## 2019-06-14 ENCOUNTER — Other Ambulatory Visit: Payer: Self-pay

## 2019-06-14 DIAGNOSIS — C50212 Malignant neoplasm of upper-inner quadrant of left female breast: Secondary | ICD-10-CM

## 2019-06-14 DIAGNOSIS — Z170 Estrogen receptor positive status [ER+]: Secondary | ICD-10-CM
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
## 2019-06-15 ENCOUNTER — Inpatient Hospital Stay: Payer: Medicare Other | Attending: Oncology

## 2019-06-15 ENCOUNTER — Other Ambulatory Visit: Payer: Self-pay

## 2019-06-15 ENCOUNTER — Inpatient Hospital Stay (HOSPITAL_BASED_OUTPATIENT_CLINIC_OR_DEPARTMENT_OTHER): Payer: Medicare Other | Admitting: Oncology

## 2019-06-15 ENCOUNTER — Inpatient Hospital Stay: Payer: Medicare Other

## 2019-06-15 VITALS — BP 170/65 | HR 86 | Temp 98.0°F | Resp 19 | Ht 65.0 in | Wt 296.2 lb

## 2019-06-15 DIAGNOSIS — E119 Type 2 diabetes mellitus without complications: Secondary | ICD-10-CM | POA: Diagnosis not present

## 2019-06-15 DIAGNOSIS — H9209 Otalgia, unspecified ear: Secondary | ICD-10-CM | POA: Insufficient documentation

## 2019-06-15 DIAGNOSIS — Z5112 Encounter for antineoplastic immunotherapy: Secondary | ICD-10-CM | POA: Diagnosis not present

## 2019-06-15 DIAGNOSIS — Z7901 Long term (current) use of anticoagulants: Secondary | ICD-10-CM | POA: Diagnosis not present

## 2019-06-15 DIAGNOSIS — I10 Essential (primary) hypertension: Secondary | ICD-10-CM | POA: Insufficient documentation

## 2019-06-15 DIAGNOSIS — R918 Other nonspecific abnormal finding of lung field: Secondary | ICD-10-CM | POA: Diagnosis not present

## 2019-06-15 DIAGNOSIS — Z170 Estrogen receptor positive status [ER+]: Secondary | ICD-10-CM | POA: Insufficient documentation

## 2019-06-15 DIAGNOSIS — Z794 Long term (current) use of insulin: Secondary | ICD-10-CM | POA: Insufficient documentation

## 2019-06-15 DIAGNOSIS — C50212 Malignant neoplasm of upper-inner quadrant of left female breast: Secondary | ICD-10-CM

## 2019-06-15 DIAGNOSIS — Z79811 Long term (current) use of aromatase inhibitors: Secondary | ICD-10-CM | POA: Insufficient documentation

## 2019-06-15 DIAGNOSIS — C7800 Secondary malignant neoplasm of unspecified lung: Secondary | ICD-10-CM | POA: Diagnosis not present

## 2019-06-15 DIAGNOSIS — Z86718 Personal history of other venous thrombosis and embolism: Secondary | ICD-10-CM | POA: Insufficient documentation

## 2019-06-15 DIAGNOSIS — Z803 Family history of malignant neoplasm of breast: Secondary | ICD-10-CM | POA: Insufficient documentation

## 2019-06-15 DIAGNOSIS — C50919 Malignant neoplasm of unspecified site of unspecified female breast: Secondary | ICD-10-CM

## 2019-06-15 DIAGNOSIS — Z8249 Family history of ischemic heart disease and other diseases of the circulatory system: Secondary | ICD-10-CM | POA: Insufficient documentation

## 2019-06-15 DIAGNOSIS — Z79899 Other long term (current) drug therapy: Secondary | ICD-10-CM | POA: Insufficient documentation

## 2019-06-15 DIAGNOSIS — Z87891 Personal history of nicotine dependence: Secondary | ICD-10-CM | POA: Insufficient documentation

## 2019-06-15 DIAGNOSIS — Z7982 Long term (current) use of aspirin: Secondary | ICD-10-CM | POA: Insufficient documentation

## 2019-06-15 LAB — CBC WITH DIFFERENTIAL (CANCER CENTER ONLY)
Abs Immature Granulocytes: 0.01 10*3/uL (ref 0.00–0.07)
Basophils Absolute: 0 10*3/uL (ref 0.0–0.1)
Basophils Relative: 1 %
Eosinophils Absolute: 0.1 10*3/uL (ref 0.0–0.5)
Eosinophils Relative: 2 %
HCT: 44.4 % (ref 36.0–46.0)
Hemoglobin: 14.6 g/dL (ref 12.0–15.0)
Immature Granulocytes: 0 %
Lymphocytes Relative: 36 %
Lymphs Abs: 2.5 10*3/uL (ref 0.7–4.0)
MCH: 28.6 pg (ref 26.0–34.0)
MCHC: 32.9 g/dL (ref 30.0–36.0)
MCV: 86.9 fL (ref 80.0–100.0)
Monocytes Absolute: 0.5 10*3/uL (ref 0.1–1.0)
Monocytes Relative: 7 %
Neutro Abs: 3.8 10*3/uL (ref 1.7–7.7)
Neutrophils Relative %: 54 %
Platelet Count: 260 10*3/uL (ref 150–400)
RBC: 5.11 MIL/uL (ref 3.87–5.11)
RDW: 13.4 % (ref 11.5–15.5)
WBC Count: 6.9 10*3/uL (ref 4.0–10.5)
nRBC: 0 % (ref 0.0–0.2)

## 2019-06-15 LAB — CMP (CANCER CENTER ONLY)
ALT: 23 U/L (ref 0–44)
AST: 16 U/L (ref 15–41)
Albumin: 3.4 g/dL — ABNORMAL LOW (ref 3.5–5.0)
Alkaline Phosphatase: 101 U/L (ref 38–126)
Anion gap: 11 (ref 5–15)
BUN: 25 mg/dL — ABNORMAL HIGH (ref 8–23)
CO2: 31 mmol/L (ref 22–32)
Calcium: 9.8 mg/dL (ref 8.9–10.3)
Chloride: 101 mmol/L (ref 98–111)
Creatinine: 0.79 mg/dL (ref 0.44–1.00)
GFR, Est AFR Am: >60 mL/min (ref >60)
GFR, Estimated: >60 mL/min (ref >60)
Glucose, Bld: 111 mg/dL — ABNORMAL HIGH (ref 70–99)
Potassium: 4.3 mmol/L (ref 3.5–5.1)
Sodium: 143 mmol/L (ref 135–145)
Total Bilirubin: 0.3 mg/dL (ref 0.3–1.2)
Total Protein: 7.6 g/dL (ref 6.5–8.1)

## 2019-06-15 MED ORDER — TRASTUZUMAB-ANNS CHEMO 150 MG IV SOLR
750.0000 mg | Freq: Once | INTRAVENOUS | Status: AC
  Administered 2019-06-15: 750 mg via INTRAVENOUS
  Filled 2019-06-15: qty 35.72

## 2019-06-15 MED ORDER — DOXYCYCLINE HYCLATE 100 MG PO TABS: 100.0000 mg | ORAL_TABLET | Freq: Two times a day (BID) | ORAL | 1 refills | Status: DC

## 2019-06-15 MED ORDER — LORAZEPAM 2 MG/ML IJ SOLN
0.5000 mg | Freq: Once | INTRAMUSCULAR | Status: AC
  Administered 2019-06-15: 0.5 mg via INTRAVENOUS

## 2019-06-15 MED ORDER — SODIUM CHLORIDE 0.9 % IV SOLN
Freq: Once | INTRAVENOUS | Status: AC
  Administered 2019-06-15: 14:00:00 via INTRAVENOUS
  Filled 2019-06-15: qty 250

## 2019-06-15 MED ORDER — LORAZEPAM 2 MG/ML IJ SOLN
INTRAMUSCULAR | Status: AC
  Filled 2019-06-15: qty 1

## 2019-06-15 MED ORDER — DIPHENHYDRAMINE HCL 25 MG PO CAPS
ORAL_CAPSULE | ORAL | Status: AC
  Filled 2019-06-15: qty 1

## 2019-06-15 MED ORDER — HEPARIN SOD (PORK) LOCK FLUSH 100 UNIT/ML IV SOLN
500.0000 [IU] | Freq: Once | INTRAVENOUS | Status: AC | PRN
  Administered 2019-06-15: 500 [IU]
  Filled 2019-06-15: qty 5

## 2019-06-15 MED ORDER — LETROZOLE 2.5 MG PO TABS: 2.5000 mg | ORAL_TABLET | Freq: Every day | ORAL | 12 refills | Status: DC

## 2019-06-15 MED ORDER — ACETAMINOPHEN 325 MG PO TABS
ORAL_TABLET | ORAL | Status: AC
  Filled 2019-06-15: qty 2

## 2019-06-15 MED ORDER — DIPHENHYDRAMINE HCL 25 MG PO CAPS
25.0000 mg | ORAL_CAPSULE | Freq: Once | ORAL | Status: AC
  Administered 2019-06-15: 25 mg via ORAL

## 2019-06-15 MED ORDER — SODIUM CHLORIDE 0.9% FLUSH
10.0000 mL | Freq: Once | INTRAVENOUS | Status: AC
  Administered 2019-06-15: 10 mL via INTRAVENOUS
  Filled 2019-06-15: qty 10

## 2019-06-15 MED ORDER — ACETAMINOPHEN 325 MG PO TABS
650.0000 mg | ORAL_TABLET | Freq: Once | ORAL | Status: AC
  Administered 2019-06-15: 650 mg via ORAL

## 2019-06-15 NOTE — Patient Instructions (Signed)
Trastuzumab injection for infusion What is this medicine? TRASTUZUMAB (tras TOO zoo mab) is a monoclonal antibody. It is used to treat breast cancer and stomach cancer. This medicine may be used for other purposes; ask your health care provider or pharmacist if you have questions. COMMON BRAND NAME(S): Herceptin, Galvin Proffer, Trazimera What should I tell my health care provider before I take this medicine? They need to know if you have any of these conditions:  heart disease  heart failure  lung or breathing disease, like asthma  an unusual or allergic reaction to trastuzumab, benzyl alcohol, or other medications, foods, dyes, or preservatives  pregnant or trying to get pregnant  breast-feeding How should I use this medicine? This drug is given as an infusion into a vein. It is administered in a hospital or clinic by a specially trained health care professional. Talk to your pediatrician regarding the use of this medicine in children. This medicine is not approved for use in children. Overdosage: If you think you have taken too much of this medicine contact a poison control center or emergency room at once. NOTE: This medicine is only for you. Do not share this medicine with others. What if I miss a dose? It is important not to miss a dose. Call your doctor or health care professional if you are unable to keep an appointment. What may interact with this medicine? This medicine may interact with the following medications:  certain types of chemotherapy, such as daunorubicin, doxorubicin, epirubicin, and idarubicin This list may not describe all possible interactions. Give your health care provider a list of all the medicines, herbs, non-prescription drugs, or dietary supplements you use. Also tell them if you smoke, drink alcohol, or use illegal drugs. Some items may interact with your medicine. What should I watch for while using this medicine? Visit your  doctor for checks on your progress. Report any side effects. Continue your course of treatment even though you feel ill unless your doctor tells you to stop. Call your doctor or health care professional for advice if you get a fever, chills or sore throat, or other symptoms of a cold or flu. Do not treat yourself. Try to avoid being around people who are sick. You may experience fever, chills and shaking during your first infusion. These effects are usually mild and can be treated with other medicines. Report any side effects during the infusion to your health care professional. Fever and chills usually do not happen with later infusions. Do not become pregnant while taking this medicine or for 7 months after stopping it. Women should inform their doctor if they wish to become pregnant or think they might be pregnant. Women of child-bearing potential will need to have a negative pregnancy test before starting this medicine. There is a potential for serious side effects to an unborn child. Talk to your health care professional or pharmacist for more information. Do not breast-feed an infant while taking this medicine or for 7 months after stopping it. Women must use effective birth control with this medicine. What side effects may I notice from receiving this medicine? Side effects that you should report to your doctor or health care professional as soon as possible:  allergic reactions like skin rash, itching or hives, swelling of the face, lips, or tongue  chest pain or palpitations  cough  dizziness  feeling faint or lightheaded, falls  fever  general ill feeling or flu-like symptoms  signs of worsening heart failure like  breathing problems; swelling in your legs and feet  unusually weak or tired Side effects that usually do not require medical attention (report to your doctor or health care professional if they continue or are bothersome):  bone pain  changes in  taste  diarrhea  joint pain  nausea/vomiting  weight loss This list may not describe all possible side effects. Call your doctor for medical advice about side effects. You may report side effects to FDA at 1-800-FDA-1088. Where should I keep my medicine? This drug is given in a hospital or clinic and will not be stored at home. NOTE: This sheet is a summary. It may not cover all possible information. If you have questions about this medicine, talk to your doctor, pharmacist, or health care provider.  2020 Elsevier/Gold Standard (2016-07-29 14:37:52)  Coronavirus (COVID-19) Are you at risk?  Are you at risk for the Coronavirus (COVID-19)?  To be considered HIGH RISK for Coronavirus (COVID-19), you have to meet the following criteria:  . Traveled to [place], [place], [place], [place] or [place]; or in the [place] to [place], [place], [place], or [place]; and have fever, cough, and shortness of breath within the last 2 weeks of travel OR . Been in close contact with a person diagnosed with COVID-19 within the last 2 weeks and have fever, cough, and shortness of breath . IF YOU DO NOT MEET THESE CRITERIA, YOU ARE CONSIDERED LOW RISK FOR COVID-19.  What to do if you are HIGH RISK for COVID-19?  Marland Kitchen If you are having a medical emergency, call 911. . Seek medical care right away. Before you go to a doctor's office, urgent care or emergency department, call ahead and tell them about your recent travel, contact with someone diagnosed with COVID-19, and your symptoms. You should receive instructions from your physician's office regarding next steps of care.  . When you arrive at healthcare provider, tell the healthcare staff immediately you have returned from visiting [place], [place], [place], [place] or [place]; or traveled in the [place] to [place], [place], [place], or [place]; in the last two weeks or you have been in close contact with a person diagnosed with COVID-19  in the last 2 weeks.   . Tell the health care staff about your symptoms: fever, cough and shortness of breath. . After you have been seen by a medical provider, you will be either: o Tested for (COVID-19) and discharged home on quarantine except to seek medical care if symptoms worsen, and asked to  - Stay home and avoid contact with others until you get your results (4-5 days)  - Avoid travel on public transportation if possible (such as bus, train, or airplane) or o Sent to the Emergency Department by EMS for evaluation, COVID-19 testing, and possible admission depending on your condition and test results.  What to do if you are LOW RISK for COVID-19?  Reduce your risk of any infection by using the same precautions used for avoiding the common cold or flu:  Marland Kitchen Wash your hands often with soap and warm water for at least 20 seconds.  If soap and water are not readily available, use an alcohol-based hand sanitizer with at least 60% alcohol.  . If coughing or sneezing, cover your mouth and nose by coughing or sneezing into the elbow areas of your shirt or coat, into a tissue or into your sleeve (not your hands). . Avoid shaking hands with others and consider head nods  or verbal greetings only. . Avoid touching your eyes, nose, or mouth with unwashed hands.  . Avoid close contact with people who are sick. . Avoid places or events with large numbers of people in one location, like concerts or sporting events. . Carefully consider travel plans you have or are making. . If you are planning any travel outside or inside the [place], visit the CDC's Travelers' Health webpage for the latest health notices. . If you have some symptoms but not all symptoms, continue to monitor at home and seek medical attention if your symptoms worsen. . If you are having a medical emergency, call 911.   [place] / e-Visit:  [email or web address]         [facility]: [facility]: [phone]                   [facility]: [phone]

## 2019-06-16 ENCOUNTER — Telehealth: Payer: Self-pay | Admitting: Oncology

## 2019-06-16 LAB — CANCER ANTIGEN 27.29: CA 27.29: 19.3 U/mL (ref 0.0–38.6)

## 2019-06-16 NOTE — Telephone Encounter (Signed)
I talk with patient regarding schedule. She said she needed morning

## 2019-06-17 ENCOUNTER — Telehealth: Payer: Self-pay | Admitting: Oncology

## 2019-06-17 NOTE — Telephone Encounter (Signed)
Returned patient's phone call regarding rescheduling 11/25 appointment, per patient's request it has been rescheduled for 11/24 due to transportation.

## 2019-07-02 DIAGNOSIS — I5020 Unspecified systolic (congestive) heart failure: Secondary | ICD-10-CM | POA: Diagnosis not present

## 2019-07-07 ENCOUNTER — Telehealth: Payer: Self-pay | Admitting: *Deleted

## 2019-07-07 ENCOUNTER — Emergency Department (HOSPITAL_COMMUNITY): Payer: Medicare Other

## 2019-07-07 ENCOUNTER — Other Ambulatory Visit: Payer: Self-pay

## 2019-07-07 ENCOUNTER — Emergency Department (HOSPITAL_COMMUNITY)
Admission: EM | Admit: 2019-07-07 | Discharge: 2019-07-07 | Disposition: A | Discharge status: Home / Self Care | Payer: Medicare Other | Attending: Emergency Medicine | Admitting: Emergency Medicine

## 2019-07-07 ENCOUNTER — Encounter (HOSPITAL_COMMUNITY): Payer: Self-pay

## 2019-07-07 DIAGNOSIS — Z794 Long term (current) use of insulin: Secondary | ICD-10-CM | POA: Insufficient documentation

## 2019-07-07 DIAGNOSIS — E119 Type 2 diabetes mellitus without complications: Secondary | ICD-10-CM | POA: Insufficient documentation

## 2019-07-07 DIAGNOSIS — R42 Dizziness and giddiness: Secondary | ICD-10-CM | POA: Insufficient documentation

## 2019-07-07 DIAGNOSIS — I10 Essential (primary) hypertension: Secondary | ICD-10-CM | POA: Insufficient documentation

## 2019-07-07 DIAGNOSIS — Z87891 Personal history of nicotine dependence: Secondary | ICD-10-CM | POA: Diagnosis not present

## 2019-07-07 DIAGNOSIS — Z79899 Other long term (current) drug therapy: Secondary | ICD-10-CM | POA: Diagnosis not present

## 2019-07-07 HISTORY — DX: Otalgia, unspecified ear: H92.09

## 2019-07-07 LAB — CBC
HCT: 46.1 % — ABNORMAL HIGH (ref 36.0–46.0)
Hemoglobin: 14.5 g/dL (ref 12.0–15.0)
MCH: 28.2 pg (ref 26.0–34.0)
MCHC: 31.5 g/dL (ref 30.0–36.0)
MCV: 89.7 fL (ref 80.0–100.0)
Platelets: 261 10*3/uL (ref 150–400)
RBC: 5.14 MIL/uL — ABNORMAL HIGH (ref 3.87–5.11)
RDW: 13.3 % (ref 11.5–15.5)
WBC: 7 10*3/uL (ref 4.0–10.5)
nRBC: 0 % (ref 0.0–0.2)

## 2019-07-07 LAB — COMPREHENSIVE METABOLIC PANEL
ALT: 25 U/L (ref 0–44)
AST: 22 U/L (ref 15–41)
Albumin: 3.8 g/dL (ref 3.5–5.0)
Alkaline Phosphatase: 95 U/L (ref 38–126)
Anion gap: 9 (ref 5–15)
BUN: 20 mg/dL (ref 8–23)
CO2: 33 mmol/L — ABNORMAL HIGH (ref 22–32)
Calcium: 9.1 mg/dL (ref 8.9–10.3)
Chloride: 99 mmol/L (ref 98–111)
Creatinine, Ser: 0.73 mg/dL (ref 0.44–1.00)
GFR calc Af Amer: >60 mL/min (ref >60)
GFR calc non Af Amer: >60 mL/min (ref >60)
Glucose, Bld: 122 mg/dL — ABNORMAL HIGH (ref 70–99)
Potassium: 4.2 mmol/L (ref 3.5–5.1)
Sodium: 141 mmol/L (ref 135–145)
Total Bilirubin: 0.5 mg/dL (ref 0.3–1.2)
Total Protein: 7.8 g/dL (ref 6.5–8.1)

## 2019-07-07 LAB — CBG MONITORING, ED: Glucose-Capillary: 84 mg/dL (ref 70–99)

## 2019-07-07 MED ORDER — ONDANSETRON HCL 4 MG PO TABS: 4.0000 mg | ORAL_TABLET | Freq: Three times a day (TID) | ORAL | 0 refills | Status: DC | PRN

## 2019-07-07 MED ORDER — MECLIZINE HCL 25 MG PO TABS
25.0000 mg | ORAL_TABLET | Freq: Once | ORAL | Status: AC
  Administered 2019-07-07: 25 mg via ORAL
  Filled 2019-07-07: qty 1

## 2019-07-07 MED ORDER — SODIUM CHLORIDE 0.9% FLUSH: 3.0000 mL | Freq: Once | INTRAVENOUS | Status: DC

## 2019-07-07 MED ORDER — MECLIZINE HCL 25 MG PO TABS: 25.0000 mg | ORAL_TABLET | Freq: Three times a day (TID) | ORAL | 0 refills | Status: DC | PRN

## 2019-07-07 NOTE — ED Triage Notes (Signed)
Pt endorses home oxygen 3LNC. Pt states 1st event day before yesterday: dizziness, near syncope ( leaned head on sink) , was not wearing oxygen, left ear thumbing and pain, heart palpitations and nausea. Pt had same event twice today however worse.  Pt has had the ear pain and dizziness before however not the near blackouts. Denies chest pain or other symptoms. Last CHEMO every Thursday. (Breast and Lung Cancer).

## 2019-07-07 NOTE — ED Notes (Signed)
Patient refusing to get in bed at this time. Seated in wheelchair.

## 2019-07-07 NOTE — ED Provider Notes (Signed)
[facility] DEPT Provider Note   CSN: [id number] Arrival date & time: 07/07/19  1201     History   Chief Complaint Chief Complaint  Patient presents with  . Otalgia  . Dizziness    HPI [name] is a 68 y.o. female.     The history is provided by the patient.  Dizziness Quality:  Vertigo and lightheadedness Severity:  Mild Onset quality:  Gradual Duration:  5 weeks Timing:  Intermittent Progression:  Waxing and waning Chronicity:  Recurrent Context: head movement and standing up   Relieved by:  Being still Worsened by:  Movement Associated symptoms: syncope (near syncope, light headed) and tinnitus (not ringing but throbbing in her ears)   Associated symptoms: no blood in stool, no chest pain, no diarrhea, no headaches, no hearing loss, no nausea, no palpitations, no shortness of breath, no vision changes, no vomiting and no weakness   Risk factors: no hx of vertigo   Risk factors comment:  Hx of breast cancer on chemo   Past Medical History:  Diagnosis Date  . Arthritis   . Back pain   . Breast cancer ([place]) dx'd 11/2012   left  . Chest pain   . Diabetes mellitus without complication ([place]) 11/08/4008  . Ear pain   . Fatty liver 6/03  . Hypertension   . Lung disease   . Obesity, unspecified   . Other abnormal glucose   . Suicide attempt ([place]) 1996  . Syncope and collapse   . Unspecified sleep apnea     Patient Active Problem List   Diagnosis Date Noted  . Goals of care, counseling/discussion 08/05/2017  . Obesity, morbid, BMI 50 or higher ([place]) 10/02/2015  . Diabetic neuropathy ([place]) 10/16/2014  . Diabetes mellitus type 2 with neurological manifestations ([place]) 09/28/2014  . Malignant neoplasm of upper-inner quadrant of left breast in female, estrogen receptor positive ([place]) 05/23/2013  . 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 ([place]) 12/14/2012  . Anxiety state 10/15/2006  . HYPERTENSION, BENIGN SYSTEMIC 10/15/2006  .  Osteoarthritis of spine 10/15/2006  . LUMBAR SPINAL STENOSIS 10/15/2006  . Sleep apnea 10/15/2006    Past Surgical History:  Procedure Laterality Date  . CARDIAC CATHETERIZATION     2007  . CHOLECYSTECTOMY    . TUBAL LIGATION       OB History   No obstetric history on file.      Home Medications    Prior to Admission medications   Medication Sig Start Date End Date Taking? Authorizing Provider  albuterol (PROAIR HFA) 108 (90 Base) MCG/ACT inhaler INHALE 2 PUFFS INTO THE LUNGS EVERY 6 (SIX) HOURS AS NEEDED FOR WHEEZING. Patient taking differently: Inhale 2 puffs into the lungs every 6 (six) hours as needed for wheezing.  09/29/18  Yes Chambliss, Jeb Levering, MD  hydrocortisone ointment 0.5 % Apply 1 application topically 2 (two) times daily. To both feet 12/12/15  Yes Chambliss, Jeb Levering, MD  insulin lispro (HUMALOG) 100 UNIT/ML injection Inject 0.1 mLs (10 Units total) into the skin 3 (three) times daily before meals. Patient taking differently: Inject 13 Units into the skin 3 (three) times daily before meals.  12/09/16  Yes Chambliss, Jeb Levering, MD  LANTUS SOLOSTAR 100 UNIT/ML Solostar Pen INJECT 28 UNITS INTO THE SKIN DAILY AT 10PM Patient taking differently: Inject 28 Units into the skin daily.  07/13/18  Yes [name], MD  letrozole [facility]) 2.5 MG tablet Take 1 tablet (2.5 mg total) by mouth daily.  06/15/19  Yes Magrinat, Virgie Dad, MD  loperamide (IMODIUM) 2 MG capsule TAKE 1 CAPSULE (2 MG TOTAL) BY MOUTH AS NEEDED FOR DIARRHEA OR LOOSE STOOLS. 03/22/19  Yes Magrinat, Virgie Dad, MD  amLODipine (NORVASC) 5 MG tablet TAKE 1 TABLET BY MOUTH EVERY DAY Patient not taking: Reported on 06/22/2018 08/26/17   [name], MD  aspirin 81 MG EC tablet Take 1 tablet (81 mg total) by mouth daily. Swallow whole. Patient not taking: Reported on 06/22/2018 12/12/15   [name], MD  Blood Glucose Monitoring Suppl (ONE TOUCH ULTRA 2) w/Device KIT 1 kit by Does not  apply route QID. ICD 10-code: E11.49. 03/04/17   [name], MD  cholestyramine (QUESTRAN) 4 g packet Take 1 packet (4 g total) by mouth 3 (three) times daily with meals. Patient not taking: Reported on 07/07/2019 05/19/18   Magrinat, Virgie Dad, MD  diclofenac (VOLTAREN) 75 MG EC tablet TAKE 1 TABLET BY MOUTH TWICE A DAY Patient not taking: Reported on 06/22/2018 12/31/17   [name], MD  doxycycline (VIBRA-TABS) 100 MG tablet Take 1 tablet (100 mg total) by mouth 2 (two) times daily. Patient not taking: Reported on 07/07/2019 06/15/19   Magrinat, Virgie Dad, MD  insulin lispro (HUMALOG KWIKPEN) 100 UNIT/ML KwikPen INJECT 12 UNITS INTO THE SKIN 3 TIMES A DAY WITH MEALS Patient not taking: Reported on 07/07/2019 12/07/18   [name], MD  ipratropium-albuterol (DUONEB) 0.5-2.5 (3) MG/3ML SOLN Take 3 mLs by nebulization every 6 (six) hours. Patient not taking: Reported on 06/22/2018 10/01/14   Rama, Venetia Maxon, MD  meclizine (ANTIVERT) 25 MG tablet Take 1 tablet (25 mg total) by mouth 3 (three) times daily as needed for up to 20 doses for dizziness. 07/07/19   Timmey Lamba, DO  nitrofurantoin (MACRODANTIN) 100 MG capsule TAKE 1 CAPSULE BY MOUTH AT BEDTIME Patient not taking: Reported on 07/07/2019 04/18/19   Magrinat, Virgie Dad, MD  ondansetron (ZOFRAN) 4 MG tablet Take 1 tablet (4 mg total) by mouth every 8 (eight) hours as needed for up to 20 doses for nausea or vomiting. 07/07/19   Demetric Dunnaway, DO  ONETOUCH ULTRA test strip USE AS INSTRUCTED TO TEST 3 TIMES DAILY. DX CODE: E11.49 Patient taking differently: 1 each by Other route 3 (three) times daily.  05/30/19   [name], MD  OXYGEN Inhale into the lungs.    [provider]  terbinafine (LAMISIL AT) 1 % cream Apply 1 application topically 2 (two) times daily. Patient not taking: Reported on 07/07/2019 01/09/16   [name], MD    Family History Family History  Problem Relation Age  of Onset  . Coronary artery disease Father 85  . Diabetes Father   . Heart disease Father   . Breast cancer Mother 1  . Cancer Mother 67       breast  . Aplastic anemia Daughter        died at age 92  . Cancer Maternal Aunt 40       ovarian  . Cancer Maternal Grandmother 55       ovarian  . Cancer Paternal Aunt 66       ovarian/breast/breast  . Coronary artery disease Sister 53  . Coronary artery disease Brother 48    Social History Social History   Tobacco Use  . Smoking status: Former Smoker    Packs/day: 3.00    Years: 5.00    Pack years: 15.00  Types: Cigarettes    Quit date: 08/18/1968    Years since quitting: 50.9  . Smokeless tobacco: Never Used  Substance Use Topics  . Alcohol use: No    Alcohol/week: 0.0 standard drinks  . Drug use: No     Allergies   Meperidine hcl, Penicillins, Amoxicillin, Aspirin, and Percocet [oxycodone-acetaminophen]   Review of Systems Review of Systems  Constitutional: Negative for chills and fever.  HENT: Positive for ear pain and tinnitus (not ringing but throbbing in her ears). Negative for congestion, dental problem, drooling, hearing loss and sore throat.   Eyes: Negative for pain and visual disturbance.  Respiratory: Negative for cough and shortness of breath.   Cardiovascular: Positive for syncope (near syncope, light headed). Negative for chest pain and palpitations.  Gastrointestinal: Negative for abdominal pain, blood in stool, diarrhea, nausea and vomiting.  Genitourinary: Negative for dysuria and hematuria.  Musculoskeletal: Negative for arthralgias and back pain.  Skin: Negative for color change and rash.  Neurological: Positive for dizziness. Negative for tremors, seizures, syncope, facial asymmetry, speech difficulty, weakness, light-headedness, numbness and headaches.  All other systems reviewed and are negative.    Physical Exam Updated Vital Signs  ED Triage Vitals  Enc Vitals Group     BP 07/07/19  1213 (!) 166/71     Pulse Rate 07/07/19 1213 69     Resp 07/07/19 1213 18     Temp 07/07/19 1213 98.5 F (36.9 C)     Temp Source 07/07/19 1213 Oral     SpO2 07/07/19 1213 97 %     Weight 07/07/19 1233 291 lb 0.1 oz (132 kg)     Height 07/07/19 1233 '5\' 4"'$  (1.626 m)     Head Circumference --      Peak Flow --      Pain Score 07/07/19 1233 5     Pain Loc --      Pain Edu? --      Excl. in [place]? --     Physical Exam Vitals signs and nursing note reviewed.  Constitutional:      General: She is not in acute distress.    Appearance: She is well-developed.  HENT:     Head: Normocephalic and atraumatic.     Right Ear: Tympanic membrane and external ear normal. There is no impacted cerumen.     Left Ear: Tympanic membrane and external ear normal. There is no impacted cerumen.     Nose: Nose normal.  Eyes:     Extraocular Movements: Extraocular movements intact.     Conjunctiva/sclera: Conjunctivae normal.     Pupils: Pupils are equal, round, and reactive to light.  Neck:     Musculoskeletal: Normal range of motion and neck supple.  Cardiovascular:     Rate and Rhythm: Normal rate and regular rhythm.     Pulses: Normal pulses.     Heart sounds: Normal heart sounds. No murmur.  Pulmonary:     Effort: Pulmonary effort is normal. No respiratory distress.     Breath sounds: Normal breath sounds.  Abdominal:     Palpations: Abdomen is soft.     Tenderness: There is no abdominal tenderness.  Skin:    General: Skin is warm and dry.     Capillary Refill: Capillary refill takes less than 2 seconds.  Neurological:     General: No focal deficit present.     Mental Status: She is alert and oriented to person, place, and time.     Cranial  Nerves: No cranial nerve deficit.     Sensory: No sensory deficit.     Motor: No weakness.     Coordination: Coordination normal.     Comments: 5+ out of 5 strength, normal sensation, no drift, normal finger-to-nose finger, normal speech      ED  Treatments / Results  Labs (all labs ordered are listed, but only abnormal results are displayed) Labs Reviewed  CBC - Abnormal; Notable for the following components:      Result Value   RBC 5.14 (*)    HCT 46.1 (*)    All other components within normal limits  COMPREHENSIVE METABOLIC PANEL - Abnormal; Notable for the following components:   CO2 33 (*)    Glucose, Bld 122 (*)    All other components within normal limits  CBG MONITORING, ED    EKG EKG Interpretation  Date/Time:  Thursday July 07 2019 12:54:39 EST Ventricular Rate:  63 PR Interval:    QRS Duration: 157 QT Interval:  433 QTC Calculation: 444 R Axis:   -46 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 EXAM: CT HEAD WITHOUT CONTRAST TECHNIQUE: Contiguous axial images were obtained from the base of the skull through the vertex without intravenous contrast. COMPARISON:  MRI head 08/29/2013 FINDINGS: Brain: Ventricle size and cerebral volume normal. Mild white matter changes similar to the prior study. Negative for acute infarct, hemorrhage, or mass. No midline shift. Vascular: Negative for hyperdense vessel Skull: Negative Sinuses/Orbits: Negative Other: None IMPRESSION: No acute abnormality. Mild chronic white matter changes likely microvascular ischemia. Electronically Signed   By: Franchot Gallo M.D.   On: 07/07/2019 16:05    Procedures Procedures (including critical care time)  Medications Ordered in ED Medications  sodium chloride flush (NS) 0.9 % injection 3 mL (has no administration in time range)  meclizine (ANTIVERT) tablet 25 mg (25 mg Oral Given 07/07/19 1428)     Initial Impression / Assessment and Plan / ED Course  I have reviewed the triage vital signs and the nursing notes.  Pertinent labs & imaging results that  were available during my care of the patient were reviewed by me and considered in my medical decision making (see chart for details).     [name] is a 68 year old female with history of hypertension, breast cancer on chemotherapy, diabetes who presents to the ED with dizziness, ear pain, near syncope.  Patient with normal vitals.  No fever.  Has had episodes of vertigo/lightheadedness over the last several weeks.  Has been given antibiotics for possible ear infection in the past.  Has been recommended to see ENT.  Had another episode or 2 today of feeling throbbing in her ears and lightheadedness and dizziness especially when she turns her head to the left.  She has had some left ear pain.  Overall on exam ears look unremarkable.  There is no perforation of eardrum.  There is no earwax.  No signs of ear infection.  She has normal neurological exam.  No nystagmus.  EKG shows junctional rhythm.  No ischemic changes.  Does not have any chest pain.  She does not have any symptoms at rest.  Symptoms have been waxing and waning and are consistent with likely peripheral vertigo.  No concern for stroke.  Will get a head CT to rule out any type  of mass-effect given her history of breast cancer.  Will give meclizine.  Lab work already collected shows no significant anemia, electrolyte abnormality, kidney injury.  CT scan showed no acute findings within the brain.  Patient likely with a peripheral vertigo.  Will prescribe meclizine, Zofran.  No concern for stroke at this time given history and physical.  Given information to follow-up with ENT.  Given return precautions.  This chart was dictated using voice recognition software.  Despite best efforts to proofread,  errors can occur which can change the documentation meaning.    Final Clinical Impressions(s) / ED Diagnoses   Final diagnoses:  Dizziness    ED Discharge Orders         Ordered    ondansetron (ZOFRAN) 4 MG tablet  Every 8 hours PRN      07/07/19 1619    meclizine (ANTIVERT) 25 MG tablet  3 times daily PRN     07/07/19 1619           Lennice Sites, DO 07/07/19 1619

## 2019-07-07 NOTE — ED Notes (Signed)
ED Provider at bedside. 

## 2019-07-11 ENCOUNTER — Other Ambulatory Visit: Payer: Self-pay

## 2019-07-11 ENCOUNTER — Other Ambulatory Visit: Payer: Self-pay | Admitting: *Deleted

## 2019-07-11 MED ORDER — ONETOUCH ULTRA 2 W/DEVICE KIT: 1.0000 | PACK | Freq: Four times a day (QID) | 0 refills | Status: DC

## 2019-07-11 MED ORDER — MECLIZINE HCL 25 MG PO TABS: 25.0000 mg | ORAL_TABLET | Freq: Three times a day (TID) | ORAL | 0 refills | Status: DC | PRN

## 2019-07-11 NOTE — Telephone Encounter (Signed)
Patient calls nurse line requesting more Meclizine. Patient stated she was recently diagnosed with vertigo. ED provider gave her #20 tabs, patient stated I will be out over Thanksgiving. Patient does have an upcoming apt with ENT on 11/30. Patient is requesting enough until then. Please advise.

## 2019-07-12 ENCOUNTER — Inpatient Hospital Stay: Payer: Medicare Other

## 2019-07-12 ENCOUNTER — Other Ambulatory Visit: Payer: Self-pay

## 2019-07-12 ENCOUNTER — Other Ambulatory Visit: Payer: Self-pay | Admitting: *Deleted

## 2019-07-12 ENCOUNTER — Other Ambulatory Visit: Payer: Self-pay | Admitting: Family Medicine

## 2019-07-12 ENCOUNTER — Inpatient Hospital Stay: Payer: Medicare Other | Attending: Oncology

## 2019-07-12 ENCOUNTER — Encounter: Payer: Self-pay | Admitting: *Deleted

## 2019-07-12 VITALS — BP 153/74 | HR 68 | Temp 98.3°F | Resp 17

## 2019-07-12 DIAGNOSIS — Z5112 Encounter for antineoplastic immunotherapy: Secondary | ICD-10-CM | POA: Insufficient documentation

## 2019-07-12 DIAGNOSIS — Z170 Estrogen receptor positive status [ER+]: Secondary | ICD-10-CM | POA: Insufficient documentation

## 2019-07-12 DIAGNOSIS — C50212 Malignant neoplasm of upper-inner quadrant of left female breast: Secondary | ICD-10-CM | POA: Insufficient documentation

## 2019-07-12 DIAGNOSIS — C7800 Secondary malignant neoplasm of unspecified lung: Secondary | ICD-10-CM | POA: Insufficient documentation

## 2019-07-12 MED ORDER — DIPHENHYDRAMINE HCL 25 MG PO CAPS
25.0000 mg | ORAL_CAPSULE | Freq: Once | ORAL | Status: AC
  Administered 2019-07-12: 25 mg via ORAL

## 2019-07-12 MED ORDER — TRASTUZUMAB-ANNS CHEMO 150 MG IV SOLR
750.0000 mg | Freq: Once | INTRAVENOUS | Status: AC
  Administered 2019-07-12: 750 mg via INTRAVENOUS
  Filled 2019-07-12: qty 35.72

## 2019-07-12 MED ORDER — ACETAMINOPHEN 325 MG PO TABS
650.0000 mg | ORAL_TABLET | Freq: Once | ORAL | Status: AC
  Administered 2019-07-12: 650 mg via ORAL

## 2019-07-12 MED ORDER — MECLIZINE HCL 25 MG PO TABS: 25.0000 mg | ORAL_TABLET | ORAL | 0 refills | Status: DC | PRN

## 2019-07-12 MED ORDER — DIPHENHYDRAMINE HCL 25 MG PO CAPS
ORAL_CAPSULE | ORAL | Status: AC
  Filled 2019-07-12: qty 1

## 2019-07-12 MED ORDER — LORAZEPAM 2 MG/ML IJ SOLN
INTRAMUSCULAR | Status: AC
  Filled 2019-07-12: qty 1

## 2019-07-12 MED ORDER — SODIUM CHLORIDE 0.9% FLUSH
10.0000 mL | Freq: Once | INTRAVENOUS | Status: AC
  Administered 2019-07-12: 10 mL via INTRAVENOUS
  Filled 2019-07-12: qty 10

## 2019-07-12 MED ORDER — HEPARIN SOD (PORK) LOCK FLUSH 100 UNIT/ML IV SOLN
500.0000 [IU] | Freq: Once | INTRAVENOUS | Status: AC | PRN
  Administered 2019-07-12: 500 [IU]
  Filled 2019-07-12: qty 5

## 2019-07-12 MED ORDER — LORAZEPAM 2 MG/ML IJ SOLN
0.5000 mg | Freq: Once | INTRAMUSCULAR | Status: AC
  Administered 2019-07-12: 0.5 mg via INTRAVENOUS

## 2019-07-12 MED ORDER — SODIUM CHLORIDE 0.9 % IV SOLN
Freq: Once | INTRAVENOUS | Status: AC
  Administered 2019-07-12: 11:00:00 via INTRAVENOUS
  Filled 2019-07-12: qty 250

## 2019-07-12 MED ORDER — ACETAMINOPHEN 325 MG PO TABS
ORAL_TABLET | ORAL | Status: AC
  Filled 2019-07-12: qty 2

## 2019-07-12 NOTE — Patient Instructions (Signed)
[facility] °Discharge Instructions for Patients Receiving Chemotherapy ° °Today you received the following chemotherapy agents Trastuzumab ° °To help prevent nausea and vomiting after your treatment, we encourage you to take your nausea medication as directed. °  °If you develop nausea and vomiting that is not controlled by your nausea medication, call the clinic.  ° °BELOW ARE SYMPTOMS THAT SHOULD BE REPORTED IMMEDIATELY: °· *FEVER GREATER THAN 100.5 F °· *CHILLS WITH OR WITHOUT FEVER °· NAUSEA AND VOMITING THAT IS NOT CONTROLLED WITH YOUR NAUSEA MEDICATION °· *UNUSUAL SHORTNESS OF BREATH °· *UNUSUAL BRUISING OR BLEEDING °· TENDERNESS IN MOUTH AND THROAT WITH OR WITHOUT PRESENCE OF ULCERS °· *URINARY PROBLEMS °· *BOWEL PROBLEMS °· UNUSUAL RASH °Items with * indicate a potential emergency and should be followed up as soon as possible. ° °Feel free to call the clinic should you have any questions or concerns. The clinic phone number is [phone]. ° °Please show the CHEMO ALERT CARD at check-in to the Emergency Department and triage nurse. ° ° °

## 2019-07-12 NOTE — Progress Notes (Signed)
[name]  Patient requested a call from clinical social work.  CSW contacted patient at home to offer support and assess for needs.  Patient stated due to recent car trouble she would need transportation assistance to [facility]  CSW and patient reviewed resources, and CSW sent referral to [place], Solic[employer].  CSW also provided information on [employer].  This is a transportation benefit through patients insurance provider.  CSW encouraged patient to call and confirm benefit, as this can be used for appointments outside of [facility].      Johnnye Lana, MSW, LCSW, OSW-C Clinical Social Worker [facility] [phone]

## 2019-07-13 ENCOUNTER — Other Ambulatory Visit: Payer: Medicare Other

## 2019-07-13 ENCOUNTER — Ambulatory Visit: Payer: Medicare Other

## 2019-07-20 DIAGNOSIS — H903 Sensorineural hearing loss, bilateral: Secondary | ICD-10-CM | POA: Diagnosis not present

## 2019-07-20 DIAGNOSIS — K1120 Sialoadenitis, unspecified: Secondary | ICD-10-CM | POA: Diagnosis not present

## 2019-07-20 DIAGNOSIS — R42 Dizziness and giddiness: Secondary | ICD-10-CM | POA: Diagnosis not present

## 2019-08-01 DIAGNOSIS — I5020 Unspecified systolic (congestive) heart failure: Secondary | ICD-10-CM | POA: Diagnosis not present

## 2019-08-04 ENCOUNTER — Other Ambulatory Visit: Payer: Self-pay | Admitting: Family Medicine

## 2019-08-09 ENCOUNTER — Other Ambulatory Visit: Payer: Self-pay | Admitting: *Deleted

## 2019-08-09 DIAGNOSIS — C7800 Secondary malignant neoplasm of unspecified lung: Secondary | ICD-10-CM

## 2019-08-09 DIAGNOSIS — C50919 Malignant neoplasm of unspecified site of unspecified female breast: Secondary | ICD-10-CM

## 2019-08-10 ENCOUNTER — Inpatient Hospital Stay: Payer: Medicare Other | Attending: Oncology

## 2019-08-10 ENCOUNTER — Other Ambulatory Visit: Payer: Self-pay

## 2019-08-10 ENCOUNTER — Inpatient Hospital Stay: Payer: Medicare Other

## 2019-08-10 VITALS — BP 140/54 | HR 57 | Temp 98.3°F | Resp 18

## 2019-08-10 DIAGNOSIS — Z170 Estrogen receptor positive status [ER+]: Secondary | ICD-10-CM | POA: Insufficient documentation

## 2019-08-10 DIAGNOSIS — Z79811 Long term (current) use of aromatase inhibitors: Secondary | ICD-10-CM | POA: Insufficient documentation

## 2019-08-10 DIAGNOSIS — C50212 Malignant neoplasm of upper-inner quadrant of left female breast: Secondary | ICD-10-CM | POA: Diagnosis not present

## 2019-08-10 DIAGNOSIS — Z7982 Long term (current) use of aspirin: Secondary | ICD-10-CM | POA: Insufficient documentation

## 2019-08-10 DIAGNOSIS — Z79899 Other long term (current) drug therapy: Secondary | ICD-10-CM | POA: Diagnosis not present

## 2019-08-10 DIAGNOSIS — Z86718 Personal history of other venous thrombosis and embolism: Secondary | ICD-10-CM | POA: Insufficient documentation

## 2019-08-10 DIAGNOSIS — C7801 Secondary malignant neoplasm of right lung: Secondary | ICD-10-CM | POA: Insufficient documentation

## 2019-08-10 DIAGNOSIS — C7802 Secondary malignant neoplasm of left lung: Secondary | ICD-10-CM | POA: Diagnosis not present

## 2019-08-10 DIAGNOSIS — C50919 Malignant neoplasm of unspecified site of unspecified female breast: Secondary | ICD-10-CM

## 2019-08-10 DIAGNOSIS — Z5112 Encounter for antineoplastic immunotherapy: Secondary | ICD-10-CM | POA: Diagnosis not present

## 2019-08-10 DIAGNOSIS — Z87891 Personal history of nicotine dependence: Secondary | ICD-10-CM | POA: Insufficient documentation

## 2019-08-10 DIAGNOSIS — C7800 Secondary malignant neoplasm of unspecified lung: Secondary | ICD-10-CM

## 2019-08-10 LAB — CBC WITH DIFFERENTIAL (CANCER CENTER ONLY)
Abs Immature Granulocytes: 0.02 10*3/uL (ref 0.00–0.07)
Basophils Absolute: 0 10*3/uL (ref 0.0–0.1)
Basophils Relative: 0 %
Eosinophils Absolute: 0.1 10*3/uL (ref 0.0–0.5)
Eosinophils Relative: 2 %
HCT: 41.3 % (ref 36.0–46.0)
Hemoglobin: 13.2 g/dL (ref 12.0–15.0)
Immature Granulocytes: 0 %
Lymphocytes Relative: 39 %
Lymphs Abs: 2.2 10*3/uL (ref 0.7–4.0)
MCH: 27.8 pg (ref 26.0–34.0)
MCHC: 32 g/dL (ref 30.0–36.0)
MCV: 87.1 fL (ref 80.0–100.0)
Monocytes Absolute: 0.4 10*3/uL (ref 0.1–1.0)
Monocytes Relative: 7 %
Neutro Abs: 2.9 10*3/uL (ref 1.7–7.7)
Neutrophils Relative %: 52 %
Platelet Count: 234 10*3/uL (ref 150–400)
RBC: 4.74 MIL/uL (ref 3.87–5.11)
RDW: 13.6 % (ref 11.5–15.5)
WBC Count: 5.7 10*3/uL (ref 4.0–10.5)
nRBC: 0 % (ref 0.0–0.2)

## 2019-08-10 LAB — CMP (CANCER CENTER ONLY)
ALT: 39 U/L (ref 0–44)
AST: 28 U/L (ref 15–41)
Albumin: 3.2 g/dL — ABNORMAL LOW (ref 3.5–5.0)
Alkaline Phosphatase: 132 U/L — ABNORMAL HIGH (ref 38–126)
Anion gap: 7 (ref 5–15)
BUN: 20 mg/dL (ref 8–23)
CO2: 32 mmol/L (ref 22–32)
Calcium: 9.1 mg/dL (ref 8.9–10.3)
Chloride: 101 mmol/L (ref 98–111)
Creatinine: 0.79 mg/dL (ref 0.44–1.00)
GFR, Est AFR Am: >60 mL/min (ref >60)
GFR, Estimated: >60 mL/min (ref >60)
Glucose, Bld: 237 mg/dL — ABNORMAL HIGH (ref 70–99)
Potassium: 4.4 mmol/L (ref 3.5–5.1)
Sodium: 140 mmol/L (ref 135–145)
Total Bilirubin: 0.2 mg/dL — ABNORMAL LOW (ref 0.3–1.2)
Total Protein: 7.1 g/dL (ref 6.5–8.1)

## 2019-08-10 MED ORDER — TRASTUZUMAB-ANNS CHEMO 150 MG IV SOLR
750.0000 mg | Freq: Once | INTRAVENOUS | Status: AC
  Administered 2019-08-10: 10:00:00 750 mg via INTRAVENOUS
  Filled 2019-08-10: qty 35.72

## 2019-08-10 MED ORDER — LORAZEPAM 2 MG/ML IJ SOLN
INTRAMUSCULAR | Status: AC
  Filled 2019-08-10: qty 1

## 2019-08-10 MED ORDER — DIPHENHYDRAMINE HCL 25 MG PO CAPS
25.0000 mg | ORAL_CAPSULE | Freq: Once | ORAL | Status: AC
  Administered 2019-08-10: 09:00:00 25 mg via ORAL

## 2019-08-10 MED ORDER — ACETAMINOPHEN 325 MG PO TABS
ORAL_TABLET | ORAL | Status: AC
  Filled 2019-08-10: qty 2

## 2019-08-10 MED ORDER — HEPARIN SOD (PORK) LOCK FLUSH 100 UNIT/ML IV SOLN
500.0000 [IU] | Freq: Once | INTRAVENOUS | Status: AC | PRN
  Administered 2019-08-10: 500 [IU]
  Filled 2019-08-10: qty 5

## 2019-08-10 MED ORDER — ACETAMINOPHEN 325 MG PO TABS
650.0000 mg | ORAL_TABLET | Freq: Once | ORAL | Status: AC
  Administered 2019-08-10: 09:00:00 650 mg via ORAL

## 2019-08-10 MED ORDER — SODIUM CHLORIDE 0.9% FLUSH
10.0000 mL | Freq: Once | INTRAVENOUS | Status: AC
  Administered 2019-08-10: 11:00:00 10 mL via INTRAVENOUS
  Filled 2019-08-10: qty 10

## 2019-08-10 MED ORDER — SODIUM CHLORIDE 0.9 % IV SOLN
Freq: Once | INTRAVENOUS | Status: AC
  Filled 2019-08-10: qty 250

## 2019-08-10 MED ORDER — LORAZEPAM 2 MG/ML IJ SOLN
0.5000 mg | Freq: Once | INTRAMUSCULAR | Status: AC
  Administered 2019-08-10: 09:00:00 0.5 mg via INTRAVENOUS

## 2019-08-10 NOTE — Patient Instructions (Signed)
[facility] Discharge Instructions for Patients Receiving Chemotherapy  Today you received the following chemotherapy agents: Trastuzumab   To help prevent nausea and vomiting after your treatment, we encourage you to take your nausea medication  as prescribed.    If you develop nausea and vomiting that is not controlled by your nausea medication, call the clinic.   BELOW ARE SYMPTOMS THAT SHOULD BE REPORTED IMMEDIATELY:  *FEVER GREATER THAN 100.5 F  *CHILLS WITH OR WITHOUT FEVER  NAUSEA AND VOMITING THAT IS NOT CONTROLLED WITH YOUR NAUSEA MEDICATION  *UNUSUAL SHORTNESS OF BREATH  *UNUSUAL BRUISING OR BLEEDING  TENDERNESS IN MOUTH AND THROAT WITH OR WITHOUT PRESENCE OF ULCERS  *URINARY PROBLEMS  *BOWEL PROBLEMS  UNUSUAL RASH Items with * indicate a potential emergency and should be followed up as soon as possible.  Feel free to call the clinic should you have any questions or concerns. The clinic phone number is [phone].  Please show the CHEMO ALERT CARD at check-in to the Emergency Department and triage nurse.   

## 2019-09-01 DIAGNOSIS — I5020 Unspecified systolic (congestive) heart failure: Secondary | ICD-10-CM | POA: Diagnosis not present

## 2019-09-02 ENCOUNTER — Other Ambulatory Visit: Payer: Self-pay | Admitting: Oncology

## 2019-09-02 DIAGNOSIS — C50212 Malignant neoplasm of upper-inner quadrant of left female breast: Secondary | ICD-10-CM

## 2019-09-02 DIAGNOSIS — Z170 Estrogen receptor positive status [ER+]: Secondary | ICD-10-CM

## 2019-09-06 ENCOUNTER — Other Ambulatory Visit: Payer: Self-pay | Admitting: *Deleted

## 2019-09-06 DIAGNOSIS — C50212 Malignant neoplasm of upper-inner quadrant of left female breast: Secondary | ICD-10-CM

## 2019-09-06 DIAGNOSIS — Z170 Estrogen receptor positive status [ER+]: Secondary | ICD-10-CM

## 2019-09-07 ENCOUNTER — Other Ambulatory Visit: Payer: Self-pay

## 2019-09-07 ENCOUNTER — Inpatient Hospital Stay: Payer: Medicare Other

## 2019-09-07 ENCOUNTER — Inpatient Hospital Stay: Payer: Medicare Other | Attending: Oncology

## 2019-09-07 VITALS — BP 176/68 | HR 72 | Temp 98.0°F | Resp 20

## 2019-09-07 DIAGNOSIS — Z86718 Personal history of other venous thrombosis and embolism: Secondary | ICD-10-CM | POA: Diagnosis not present

## 2019-09-07 DIAGNOSIS — C7800 Secondary malignant neoplasm of unspecified lung: Secondary | ICD-10-CM | POA: Diagnosis not present

## 2019-09-07 DIAGNOSIS — Z170 Estrogen receptor positive status [ER+]: Secondary | ICD-10-CM

## 2019-09-07 DIAGNOSIS — Z79899 Other long term (current) drug therapy: Secondary | ICD-10-CM | POA: Insufficient documentation

## 2019-09-07 DIAGNOSIS — E119 Type 2 diabetes mellitus without complications: Secondary | ICD-10-CM | POA: Insufficient documentation

## 2019-09-07 DIAGNOSIS — C50212 Malignant neoplasm of upper-inner quadrant of left female breast: Secondary | ICD-10-CM | POA: Diagnosis not present

## 2019-09-07 DIAGNOSIS — Z5112 Encounter for antineoplastic immunotherapy: Secondary | ICD-10-CM | POA: Insufficient documentation

## 2019-09-07 DIAGNOSIS — Z7982 Long term (current) use of aspirin: Secondary | ICD-10-CM | POA: Insufficient documentation

## 2019-09-07 DIAGNOSIS — Z87891 Personal history of nicotine dependence: Secondary | ICD-10-CM | POA: Diagnosis not present

## 2019-09-07 DIAGNOSIS — I10 Essential (primary) hypertension: Secondary | ICD-10-CM | POA: Insufficient documentation

## 2019-09-07 DIAGNOSIS — Z79811 Long term (current) use of aromatase inhibitors: Secondary | ICD-10-CM | POA: Insufficient documentation

## 2019-09-07 LAB — CMP (CANCER CENTER ONLY)
ALT: 40 U/L (ref 0–44)
AST: 24 U/L (ref 15–41)
Albumin: 3.1 g/dL — ABNORMAL LOW (ref 3.5–5.0)
Alkaline Phosphatase: 112 U/L (ref 38–126)
Anion gap: 9 (ref 5–15)
BUN: 22 mg/dL (ref 8–23)
CO2: 29 mmol/L (ref 22–32)
Calcium: 8.9 mg/dL (ref 8.9–10.3)
Chloride: 103 mmol/L (ref 98–111)
Creatinine: 1.01 mg/dL — ABNORMAL HIGH (ref 0.44–1.00)
GFR, Est AFR Am: >60 mL/min (ref >60)
GFR, Estimated: 57 mL/min — ABNORMAL LOW (ref >60)
Glucose, Bld: 262 mg/dL — ABNORMAL HIGH (ref 70–99)
Potassium: 4.3 mmol/L (ref 3.5–5.1)
Sodium: 141 mmol/L (ref 135–145)
Total Bilirubin: 0.3 mg/dL (ref 0.3–1.2)
Total Protein: 7 g/dL (ref 6.5–8.1)

## 2019-09-07 LAB — CBC WITH DIFFERENTIAL (CANCER CENTER ONLY)
Abs Immature Granulocytes: 0.02 10*3/uL (ref 0.00–0.07)
Basophils Absolute: 0 10*3/uL (ref 0.0–0.1)
Basophils Relative: 0 %
Eosinophils Absolute: 0.1 10*3/uL (ref 0.0–0.5)
Eosinophils Relative: 2 %
HCT: 43.4 % (ref 36.0–46.0)
Hemoglobin: 14.1 g/dL (ref 12.0–15.0)
Immature Granulocytes: 0 %
Lymphocytes Relative: 37 %
Lymphs Abs: 2.3 10*3/uL (ref 0.7–4.0)
MCH: 28.5 pg (ref 26.0–34.0)
MCHC: 32.5 g/dL (ref 30.0–36.0)
MCV: 87.7 fL (ref 80.0–100.0)
Monocytes Absolute: 0.3 10*3/uL (ref 0.1–1.0)
Monocytes Relative: 5 %
Neutro Abs: 3.5 10*3/uL (ref 1.7–7.7)
Neutrophils Relative %: 56 %
Platelet Count: 242 10*3/uL (ref 150–400)
RBC: 4.95 MIL/uL (ref 3.87–5.11)
RDW: 13.3 % (ref 11.5–15.5)
WBC Count: 6.4 10*3/uL (ref 4.0–10.5)
nRBC: 0 % (ref 0.0–0.2)

## 2019-09-07 MED ORDER — SODIUM CHLORIDE 0.9% FLUSH
10.0000 mL | Freq: Once | INTRAVENOUS | Status: AC
  Administered 2019-09-07: 10 mL via INTRAVENOUS
  Filled 2019-09-07: qty 10

## 2019-09-07 MED ORDER — DIPHENHYDRAMINE HCL 25 MG PO CAPS
25.0000 mg | ORAL_CAPSULE | Freq: Once | ORAL | Status: AC
  Administered 2019-09-07: 25 mg via ORAL

## 2019-09-07 MED ORDER — HEPARIN SOD (PORK) LOCK FLUSH 100 UNIT/ML IV SOLN
500.0000 [IU] | Freq: Once | INTRAVENOUS | Status: AC | PRN
  Administered 2019-09-07: 500 [IU]
  Filled 2019-09-07: qty 5

## 2019-09-07 MED ORDER — DIPHENHYDRAMINE HCL 25 MG PO CAPS
ORAL_CAPSULE | ORAL | Status: AC
  Filled 2019-09-07: qty 2

## 2019-09-07 MED ORDER — ACETAMINOPHEN 325 MG PO TABS
650.0000 mg | ORAL_TABLET | Freq: Once | ORAL | Status: AC
  Administered 2019-09-07: 650 mg via ORAL

## 2019-09-07 MED ORDER — TRASTUZUMAB-ANNS CHEMO 150 MG IV SOLR
750.0000 mg | Freq: Once | INTRAVENOUS | Status: AC
  Administered 2019-09-07: 750 mg via INTRAVENOUS
  Filled 2019-09-07: qty 35.72

## 2019-09-07 MED ORDER — SODIUM CHLORIDE 0.9 % IV SOLN
Freq: Once | INTRAVENOUS | Status: AC
  Filled 2019-09-07: qty 250

## 2019-09-07 MED ORDER — LORAZEPAM 2 MG/ML IJ SOLN
INTRAMUSCULAR | Status: AC
  Filled 2019-09-07: qty 1

## 2019-09-07 MED ORDER — ACETAMINOPHEN 325 MG PO TABS
ORAL_TABLET | ORAL | Status: AC
  Filled 2019-09-07: qty 2

## 2019-09-07 MED ORDER — LORAZEPAM 2 MG/ML IJ SOLN
0.5000 mg | Freq: Once | INTRAMUSCULAR | Status: AC
  Administered 2019-09-07: 0.5 mg via INTRAVENOUS

## 2019-09-07 NOTE — Patient Instructions (Signed)
[name] Discharge Instructions for Patients Receiving Chemotherapy  Today you received the following chemotherapy agents: Trastuzumab   To help prevent nausea and vomiting after your treatment, we encourage you to take your nausea medication  as prescribed.    If you develop nausea and vomiting that is not controlled by your nausea medication, call the clinic.   BELOW ARE SYMPTOMS THAT SHOULD BE REPORTED IMMEDIATELY:  *FEVER GREATER THAN 100.5 F  *CHILLS WITH OR WITHOUT FEVER  NAUSEA AND VOMITING THAT IS NOT CONTROLLED WITH YOUR NAUSEA MEDICATION  *UNUSUAL SHORTNESS OF BREATH  *UNUSUAL BRUISING OR BLEEDING  TENDERNESS IN MOUTH AND THROAT WITH OR WITHOUT PRESENCE OF ULCERS  *URINARY PROBLEMS  *BOWEL PROBLEMS  UNUSUAL RASH Items with * indicate a potential emergency and should be followed up as soon as possible.  Feel free to call the clinic should you have any questions or concerns. The clinic phone number is [phone].  Please show the CHEMO ALERT CARD at check-in to the Emergency Department and triage nurse.   

## 2019-09-07 NOTE — Telephone Encounter (Signed)
No entry 

## 2019-09-09 ENCOUNTER — Other Ambulatory Visit (HOSPITAL_COMMUNITY): Payer: Medicare Other

## 2019-09-15 ENCOUNTER — Other Ambulatory Visit: Payer: Self-pay | Admitting: *Deleted

## 2019-09-16 ENCOUNTER — Other Ambulatory Visit (HOSPITAL_COMMUNITY): Payer: Medicare Other

## 2019-10-02 DIAGNOSIS — I5020 Unspecified systolic (congestive) heart failure: Secondary | ICD-10-CM | POA: Diagnosis not present

## 2019-10-04 ENCOUNTER — Other Ambulatory Visit: Payer: Self-pay | Admitting: Family Medicine

## 2019-10-04 ENCOUNTER — Other Ambulatory Visit: Payer: Self-pay

## 2019-10-04 DIAGNOSIS — C50212 Malignant neoplasm of upper-inner quadrant of left female breast: Secondary | ICD-10-CM

## 2019-10-04 DIAGNOSIS — Z170 Estrogen receptor positive status [ER+]: Secondary | ICD-10-CM

## 2019-10-05 ENCOUNTER — Encounter: Payer: Self-pay | Admitting: Oncology

## 2019-10-05 ENCOUNTER — Inpatient Hospital Stay: Payer: Medicare Other

## 2019-10-05 ENCOUNTER — Telehealth: Payer: Self-pay | Admitting: *Deleted

## 2019-10-05 ENCOUNTER — Inpatient Hospital Stay: Payer: Medicare Other | Attending: Oncology

## 2019-10-05 ENCOUNTER — Encounter: Payer: Self-pay | Admitting: Adult Health

## 2019-10-05 ENCOUNTER — Other Ambulatory Visit: Payer: Self-pay | Admitting: *Deleted

## 2019-10-05 ENCOUNTER — Inpatient Hospital Stay (HOSPITAL_BASED_OUTPATIENT_CLINIC_OR_DEPARTMENT_OTHER): Payer: Medicare Other | Admitting: Adult Health

## 2019-10-05 ENCOUNTER — Other Ambulatory Visit: Payer: Self-pay

## 2019-10-05 VITALS — BP 156/73 | HR 68 | Temp 97.6°F | Resp 18 | Ht 64.0 in | Wt 314.1 lb

## 2019-10-05 DIAGNOSIS — Z79811 Long term (current) use of aromatase inhibitors: Secondary | ICD-10-CM | POA: Diagnosis not present

## 2019-10-05 DIAGNOSIS — Z7901 Long term (current) use of anticoagulants: Secondary | ICD-10-CM | POA: Insufficient documentation

## 2019-10-05 DIAGNOSIS — Z79899 Other long term (current) drug therapy: Secondary | ICD-10-CM | POA: Diagnosis not present

## 2019-10-05 DIAGNOSIS — E1149 Type 2 diabetes mellitus with other diabetic neurological complication: Secondary | ICD-10-CM

## 2019-10-05 DIAGNOSIS — I10 Essential (primary) hypertension: Secondary | ICD-10-CM | POA: Insufficient documentation

## 2019-10-05 DIAGNOSIS — E119 Type 2 diabetes mellitus without complications: Secondary | ICD-10-CM | POA: Diagnosis not present

## 2019-10-05 DIAGNOSIS — C50212 Malignant neoplasm of upper-inner quadrant of left female breast: Secondary | ICD-10-CM | POA: Insufficient documentation

## 2019-10-05 DIAGNOSIS — Z7982 Long term (current) use of aspirin: Secondary | ICD-10-CM | POA: Insufficient documentation

## 2019-10-05 DIAGNOSIS — Z8249 Family history of ischemic heart disease and other diseases of the circulatory system: Secondary | ICD-10-CM | POA: Insufficient documentation

## 2019-10-05 DIAGNOSIS — Z791 Long term (current) use of non-steroidal anti-inflammatories (NSAID): Secondary | ICD-10-CM | POA: Diagnosis not present

## 2019-10-05 DIAGNOSIS — Z170 Estrogen receptor positive status [ER+]: Secondary | ICD-10-CM | POA: Insufficient documentation

## 2019-10-05 DIAGNOSIS — C50919 Malignant neoplasm of unspecified site of unspecified female breast: Secondary | ICD-10-CM

## 2019-10-05 DIAGNOSIS — Z794 Long term (current) use of insulin: Secondary | ICD-10-CM | POA: Insufficient documentation

## 2019-10-05 DIAGNOSIS — R918 Other nonspecific abnormal finding of lung field: Secondary | ICD-10-CM | POA: Diagnosis not present

## 2019-10-05 DIAGNOSIS — Z8041 Family history of malignant neoplasm of ovary: Secondary | ICD-10-CM | POA: Diagnosis not present

## 2019-10-05 DIAGNOSIS — M4710 Other spondylosis with myelopathy, site unspecified: Secondary | ICD-10-CM

## 2019-10-05 DIAGNOSIS — Z833 Family history of diabetes mellitus: Secondary | ICD-10-CM | POA: Diagnosis not present

## 2019-10-05 DIAGNOSIS — Z5112 Encounter for antineoplastic immunotherapy: Secondary | ICD-10-CM | POA: Diagnosis not present

## 2019-10-05 DIAGNOSIS — Z86718 Personal history of other venous thrombosis and embolism: Secondary | ICD-10-CM | POA: Diagnosis not present

## 2019-10-05 DIAGNOSIS — Z87891 Personal history of nicotine dependence: Secondary | ICD-10-CM | POA: Diagnosis not present

## 2019-10-05 DIAGNOSIS — Z803 Family history of malignant neoplasm of breast: Secondary | ICD-10-CM | POA: Insufficient documentation

## 2019-10-05 DIAGNOSIS — C7800 Secondary malignant neoplasm of unspecified lung: Secondary | ICD-10-CM | POA: Diagnosis not present

## 2019-10-05 DIAGNOSIS — M48061 Spinal stenosis, lumbar region without neurogenic claudication: Secondary | ICD-10-CM

## 2019-10-05 LAB — CMP (CANCER CENTER ONLY)
ALT: 49 U/L — ABNORMAL HIGH (ref 0–44)
AST: 29 U/L (ref 15–41)
Albumin: 3.2 g/dL — ABNORMAL LOW (ref 3.5–5.0)
Alkaline Phosphatase: 111 U/L (ref 38–126)
Anion gap: 8 (ref 5–15)
BUN: 17 mg/dL (ref 8–23)
CO2: 32 mmol/L (ref 22–32)
Calcium: 9.1 mg/dL (ref 8.9–10.3)
Chloride: 102 mmol/L (ref 98–111)
Creatinine: 0.81 mg/dL (ref 0.44–1.00)
GFR, Est AFR Am: >60 mL/min (ref >60)
GFR, Estimated: >60 mL/min (ref >60)
Glucose, Bld: 180 mg/dL — ABNORMAL HIGH (ref 70–99)
Potassium: 4.1 mmol/L (ref 3.5–5.1)
Sodium: 142 mmol/L (ref 135–145)
Total Bilirubin: 0.3 mg/dL (ref 0.3–1.2)
Total Protein: 7.3 g/dL (ref 6.5–8.1)

## 2019-10-05 LAB — CBC WITH DIFFERENTIAL (CANCER CENTER ONLY)
Abs Immature Granulocytes: 0.01 10*3/uL (ref 0.00–0.07)
Basophils Absolute: 0 10*3/uL (ref 0.0–0.1)
Basophils Relative: 1 %
Eosinophils Absolute: 0.1 10*3/uL (ref 0.0–0.5)
Eosinophils Relative: 2 %
HCT: 43.9 % (ref 36.0–46.0)
Hemoglobin: 14.1 g/dL (ref 12.0–15.0)
Immature Granulocytes: 0 %
Lymphocytes Relative: 39 %
Lymphs Abs: 2.3 10*3/uL (ref 0.7–4.0)
MCH: 28.4 pg (ref 26.0–34.0)
MCHC: 32.1 g/dL (ref 30.0–36.0)
MCV: 88.3 fL (ref 80.0–100.0)
Monocytes Absolute: 0.4 10*3/uL (ref 0.1–1.0)
Monocytes Relative: 6 %
Neutro Abs: 3.1 10*3/uL (ref 1.7–7.7)
Neutrophils Relative %: 52 %
Platelet Count: 240 10*3/uL (ref 150–400)
RBC: 4.97 MIL/uL (ref 3.87–5.11)
RDW: 13.6 % (ref 11.5–15.5)
WBC Count: 5.9 10*3/uL (ref 4.0–10.5)
nRBC: 0 % (ref 0.0–0.2)

## 2019-10-05 MED ORDER — LORAZEPAM 2 MG/ML IJ SOLN
0.5000 mg | Freq: Once | INTRAMUSCULAR | Status: AC
  Administered 2019-10-05: 10:00:00 0.5 mg via INTRAVENOUS

## 2019-10-05 MED ORDER — ACETAMINOPHEN 325 MG PO TABS
650.0000 mg | ORAL_TABLET | Freq: Once | ORAL | Status: AC
  Administered 2019-10-05: 10:00:00 650 mg via ORAL

## 2019-10-05 MED ORDER — TRASTUZUMAB-ANNS CHEMO 150 MG IV SOLR
750.0000 mg | Freq: Once | INTRAVENOUS | Status: AC
  Administered 2019-10-05: 11:00:00 750 mg via INTRAVENOUS
  Filled 2019-10-05: qty 35.72

## 2019-10-05 MED ORDER — SODIUM CHLORIDE 0.9 % IV SOLN
Freq: Once | INTRAVENOUS | Status: AC
  Filled 2019-10-05: qty 250

## 2019-10-05 MED ORDER — SODIUM CHLORIDE 0.9% FLUSH
10.0000 mL | Freq: Once | INTRAVENOUS | Status: AC
  Administered 2019-10-05: 12:00:00 10 mL via INTRAVENOUS
  Filled 2019-10-05: qty 10

## 2019-10-05 MED ORDER — HYDROCODONE-ACETAMINOPHEN 5-325 MG PO TABS: 1.0000 | ORAL_TABLET | ORAL | 0 refills | Status: DC

## 2019-10-05 MED ORDER — DIPHENHYDRAMINE HCL 25 MG PO CAPS
25.0000 mg | ORAL_CAPSULE | Freq: Once | ORAL | Status: AC
  Administered 2019-10-05: 25 mg via ORAL

## 2019-10-05 MED ORDER — DIPHENHYDRAMINE HCL 25 MG PO CAPS
ORAL_CAPSULE | ORAL | Status: AC
  Filled 2019-10-05: qty 1

## 2019-10-05 MED ORDER — ACETAMINOPHEN 325 MG PO TABS
ORAL_TABLET | ORAL | Status: AC
  Filled 2019-10-05: qty 2

## 2019-10-05 MED ORDER — LORAZEPAM 2 MG/ML IJ SOLN
INTRAMUSCULAR | Status: AC
  Filled 2019-10-05: qty 1

## 2019-10-05 MED ORDER — HEPARIN SOD (PORK) LOCK FLUSH 100 UNIT/ML IV SOLN
500.0000 [IU] | Freq: Once | INTRAVENOUS | Status: AC | PRN
  Administered 2019-10-05: 12:00:00 500 [IU]
  Filled 2019-10-05: qty 5

## 2019-10-05 NOTE — Progress Notes (Signed)
Order for hospital bed has been put in place. Zack from [facility] has been made aware of DME orders.

## 2019-10-05 NOTE — Progress Notes (Signed)
    Durable Medical Equipment  (From admission, onward)         Start     Ordered   10/05/19 0000  For home use only DME Hospital bed    Question Answer Comment  Length of Need Lifetime   The above medical condition requires: Patient requires the ability to reposition frequently   Head must be elevated greater than: 30 degrees   Bed type Semi-electric   Trapeze Bar Yes   Support Surface: Gel Overlay      10/05/19 1141
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
## 2019-10-05 NOTE — Patient Instructions (Signed)
[facility] Discharge Instructions for Patients Receiving Chemotherapy  Today you received the following chemotherapy agents: Trastuzumab   To help prevent nausea and vomiting after your treatment, we encourage you to take your nausea medication  as prescribed.    If you develop nausea and vomiting that is not controlled by your nausea medication, call the clinic.   BELOW ARE SYMPTOMS THAT SHOULD BE REPORTED IMMEDIATELY:  *FEVER GREATER THAN 100.5 F  *CHILLS WITH OR WITHOUT FEVER  NAUSEA AND VOMITING THAT IS NOT CONTROLLED WITH YOUR NAUSEA MEDICATION  *UNUSUAL SHORTNESS OF BREATH  *UNUSUAL BRUISING OR BLEEDING  TENDERNESS IN MOUTH AND THROAT WITH OR WITHOUT PRESENCE OF ULCERS  *URINARY PROBLEMS  *BOWEL PROBLEMS  UNUSUAL RASH Items with * indicate a potential emergency and should be followed up as soon as possible.  Feel free to call the clinic should you have any questions or concerns. The clinic phone number is [phone].  Please show the CHEMO ALERT CARD at check-in to the Emergency Department and triage nurse.   

## 2019-10-07 NOTE — Telephone Encounter (Signed)
Called pt to refer and make appt for a new PCP at [facility]. Pt stated, "I would rather see my PCP that I already have. I just haven't been to see him." Pt declined referral and wants to stay with present PCP.

## 2019-10-11 DIAGNOSIS — R062 Wheezing: Secondary | ICD-10-CM | POA: Diagnosis not present

## 2019-10-11 DIAGNOSIS — M25569 Pain in unspecified knee: Secondary | ICD-10-CM | POA: Diagnosis not present

## 2019-10-11 DIAGNOSIS — C50912 Malignant neoplasm of unspecified site of left female breast: Secondary | ICD-10-CM | POA: Diagnosis not present

## 2019-10-11 DIAGNOSIS — I5020 Unspecified systolic (congestive) heart failure: Secondary | ICD-10-CM | POA: Diagnosis not present

## 2019-10-11 DIAGNOSIS — C3490 Malignant neoplasm of unspecified part of unspecified bronchus or lung: Secondary | ICD-10-CM | POA: Diagnosis not present

## 2019-10-12 ENCOUNTER — Ambulatory Visit (HOSPITAL_COMMUNITY)
Admission: RE | Admit: 2019-10-12 | Discharge: 2019-10-12 | Disposition: A | Discharge status: Home / Self Care | Payer: Medicare Other | Source: Ambulatory Visit | Attending: Adult Health | Admitting: Adult Health

## 2019-10-12 ENCOUNTER — Other Ambulatory Visit: Payer: Self-pay

## 2019-10-12 DIAGNOSIS — C50919 Malignant neoplasm of unspecified site of unspecified female breast: Secondary | ICD-10-CM | POA: Diagnosis not present

## 2019-10-12 DIAGNOSIS — C7800 Secondary malignant neoplasm of unspecified lung: Secondary | ICD-10-CM | POA: Diagnosis not present

## 2019-10-12 DIAGNOSIS — Z87891 Personal history of nicotine dependence: Secondary | ICD-10-CM | POA: Insufficient documentation

## 2019-10-12 DIAGNOSIS — E119 Type 2 diabetes mellitus without complications: Secondary | ICD-10-CM | POA: Diagnosis not present

## 2019-10-12 DIAGNOSIS — I10 Essential (primary) hypertension: Secondary | ICD-10-CM | POA: Diagnosis not present

## 2019-10-12 NOTE — Progress Notes (Signed)
  Echocardiogram 2D Echocardiogram has been performed.  [name] 10/12/2019, 11:52 AM

## 2019-10-18 ENCOUNTER — Other Ambulatory Visit: Payer: Self-pay | Admitting: *Deleted

## 2019-10-18 MED ORDER — LOPERAMIDE HCL 2 MG PO CAPS: 2.0000 mg | ORAL_CAPSULE | ORAL | 0 refills | Status: DC | PRN

## 2019-10-20 ENCOUNTER — Ambulatory Visit (HOSPITAL_COMMUNITY)
Admission: RE | Admit: 2019-10-20 | Discharge: 2019-10-20 | Disposition: A | Discharge status: Home / Self Care | Payer: Medicare Other | Source: Ambulatory Visit | Attending: Adult Health | Admitting: Adult Health

## 2019-10-20 ENCOUNTER — Encounter (HOSPITAL_COMMUNITY): Payer: Self-pay

## 2019-10-20 ENCOUNTER — Other Ambulatory Visit: Payer: Self-pay

## 2019-10-20 DIAGNOSIS — Z170 Estrogen receptor positive status [ER+]: Secondary | ICD-10-CM | POA: Insufficient documentation

## 2019-10-20 DIAGNOSIS — C7800 Secondary malignant neoplasm of unspecified lung: Secondary | ICD-10-CM | POA: Insufficient documentation

## 2019-10-20 DIAGNOSIS — C50212 Malignant neoplasm of upper-inner quadrant of left female breast: Secondary | ICD-10-CM | POA: Diagnosis not present

## 2019-10-20 DIAGNOSIS — C50919 Malignant neoplasm of unspecified site of unspecified female breast: Secondary | ICD-10-CM

## 2019-10-20 DIAGNOSIS — R918 Other nonspecific abnormal finding of lung field: Secondary | ICD-10-CM | POA: Diagnosis not present

## 2019-10-20 HISTORY — DX: Secondary malignant neoplasm of unspecified lung: C78.00

## 2019-10-20 MED ORDER — HEPARIN SOD (PORK) LOCK FLUSH 100 UNIT/ML IV SOLN
INTRAVENOUS | Status: AC
  Administered 2019-10-20: 500 [IU]
  Filled 2019-10-20: qty 5

## 2019-10-20 MED ORDER — IOHEXOL 300 MG/ML  SOLN
75.0000 mL | Freq: Once | INTRAMUSCULAR | Status: AC | PRN
  Administered 2019-10-20: 75 mL via INTRAVENOUS

## 2019-10-20 MED ORDER — SODIUM CHLORIDE (PF) 0.9 % IJ SOLN
INTRAMUSCULAR | Status: AC
  Filled 2019-10-20: qty 50

## 2019-10-21 DIAGNOSIS — R062 Wheezing: Secondary | ICD-10-CM | POA: Diagnosis not present

## 2019-10-21 DIAGNOSIS — C50912 Malignant neoplasm of unspecified site of left female breast: Secondary | ICD-10-CM | POA: Diagnosis not present

## 2019-10-21 DIAGNOSIS — M25569 Pain in unspecified knee: Secondary | ICD-10-CM | POA: Diagnosis not present

## 2019-10-21 DIAGNOSIS — I5020 Unspecified systolic (congestive) heart failure: Secondary | ICD-10-CM | POA: Diagnosis not present

## 2019-10-21 DIAGNOSIS — C3490 Malignant neoplasm of unspecified part of unspecified bronchus or lung: Secondary | ICD-10-CM | POA: Diagnosis not present

## 2019-10-26 ENCOUNTER — Other Ambulatory Visit: Payer: Self-pay | Admitting: *Deleted

## 2019-10-26 ENCOUNTER — Telehealth: Payer: Self-pay | Admitting: *Deleted

## 2019-10-26 MED ORDER — GABAPENTIN 300 MG PO CAPS: 300.0000 mg | ORAL_CAPSULE | Freq: Three times a day (TID) | ORAL | 3 refills | Status: DC

## 2019-10-26 NOTE — Telephone Encounter (Signed)
Message left by the patient stating she has been having increased pain in bilateral legs " sharp stinging pains that are now in my hips "  She states she has not been able to sleep for the past 3 days due to the discomfort.  She has used tylenol and ibuprofen without benefit. She states she has used gabapentin in the past.  She is scheduled to see Dr Erin Hearing ( primary MD ) on 3/23 per discussion at visit with [facility]/NP.  They cannot prescribed anything until she is seen due to last visit was almost 2 years ago.  This message will be given to MD for review and further recommendation.

## 2019-11-02 ENCOUNTER — Other Ambulatory Visit: Payer: Medicare Other

## 2019-11-02 ENCOUNTER — Other Ambulatory Visit: Payer: Self-pay

## 2019-11-02 ENCOUNTER — Inpatient Hospital Stay: Payer: Medicare Other | Attending: Oncology

## 2019-11-02 ENCOUNTER — Inpatient Hospital Stay: Payer: Medicare Other

## 2019-11-02 ENCOUNTER — Ambulatory Visit: Payer: Medicare Other

## 2019-11-02 ENCOUNTER — Other Ambulatory Visit: Payer: Self-pay | Admitting: Oncology

## 2019-11-02 VITALS — BP 145/80 | HR 64 | Temp 98.3°F | Resp 16

## 2019-11-02 DIAGNOSIS — Z5112 Encounter for antineoplastic immunotherapy: Secondary | ICD-10-CM | POA: Insufficient documentation

## 2019-11-02 DIAGNOSIS — C50212 Malignant neoplasm of upper-inner quadrant of left female breast: Secondary | ICD-10-CM | POA: Diagnosis not present

## 2019-11-02 DIAGNOSIS — C7800 Secondary malignant neoplasm of unspecified lung: Secondary | ICD-10-CM | POA: Insufficient documentation

## 2019-11-02 DIAGNOSIS — Z170 Estrogen receptor positive status [ER+]: Secondary | ICD-10-CM | POA: Diagnosis not present

## 2019-11-02 LAB — CBC WITH DIFFERENTIAL (CANCER CENTER ONLY)
Abs Immature Granulocytes: 0.02 10*3/uL (ref 0.00–0.07)
Basophils Absolute: 0 10*3/uL (ref 0.0–0.1)
Basophils Relative: 1 %
Eosinophils Absolute: 0.1 10*3/uL (ref 0.0–0.5)
Eosinophils Relative: 2 %
HCT: 46.1 % — ABNORMAL HIGH (ref 36.0–46.0)
Hemoglobin: 14.3 g/dL (ref 12.0–15.0)
Immature Granulocytes: 0 %
Lymphocytes Relative: 32 %
Lymphs Abs: 2.1 10*3/uL (ref 0.7–4.0)
MCH: 28.1 pg (ref 26.0–34.0)
MCHC: 31 g/dL (ref 30.0–36.0)
MCV: 90.6 fL (ref 80.0–100.0)
Monocytes Absolute: 0.5 10*3/uL (ref 0.1–1.0)
Monocytes Relative: 8 %
Neutro Abs: 3.7 10*3/uL (ref 1.7–7.7)
Neutrophils Relative %: 57 %
Platelet Count: 234 10*3/uL (ref 150–400)
RBC: 5.09 MIL/uL (ref 3.87–5.11)
RDW: 13.8 % (ref 11.5–15.5)
WBC Count: 6.5 10*3/uL (ref 4.0–10.5)
nRBC: 0 % (ref 0.0–0.2)

## 2019-11-02 LAB — CMP (CANCER CENTER ONLY)
ALT: 60 U/L — ABNORMAL HIGH (ref 0–44)
AST: 33 U/L (ref 15–41)
Albumin: 3.2 g/dL — ABNORMAL LOW (ref 3.5–5.0)
Alkaline Phosphatase: 126 U/L (ref 38–126)
Anion gap: 9 (ref 5–15)
BUN: 18 mg/dL (ref 8–23)
CO2: 32 mmol/L (ref 22–32)
Calcium: 9.4 mg/dL (ref 8.9–10.3)
Chloride: 99 mmol/L (ref 98–111)
Creatinine: 0.84 mg/dL (ref 0.44–1.00)
GFR, Est AFR Am: >60 mL/min (ref >60)
GFR, Estimated: >60 mL/min (ref >60)
Glucose, Bld: 286 mg/dL — ABNORMAL HIGH (ref 70–99)
Potassium: 4.1 mmol/L (ref 3.5–5.1)
Sodium: 140 mmol/L (ref 135–145)
Total Bilirubin: 0.3 mg/dL (ref 0.3–1.2)
Total Protein: 7.4 g/dL (ref 6.5–8.1)

## 2019-11-02 MED ORDER — TRASTUZUMAB-ANNS CHEMO 150 MG IV SOLR
750.0000 mg | Freq: Once | INTRAVENOUS | Status: AC
  Administered 2019-11-02: 750 mg via INTRAVENOUS
  Filled 2019-11-02: qty 35.72

## 2019-11-02 MED ORDER — SODIUM CHLORIDE 0.9% FLUSH
10.0000 mL | Freq: Once | INTRAVENOUS | Status: AC
  Administered 2019-11-02: 10 mL via INTRAVENOUS
  Filled 2019-11-02: qty 10

## 2019-11-02 MED ORDER — DIPHENHYDRAMINE HCL 25 MG PO CAPS
25.0000 mg | ORAL_CAPSULE | Freq: Once | ORAL | Status: AC
  Administered 2019-11-02: 25 mg via ORAL

## 2019-11-02 MED ORDER — ACETAMINOPHEN 325 MG PO TABS
ORAL_TABLET | ORAL | Status: AC
  Filled 2019-11-02: qty 2

## 2019-11-02 MED ORDER — ACETAMINOPHEN 325 MG PO TABS
650.0000 mg | ORAL_TABLET | Freq: Once | ORAL | Status: AC
  Administered 2019-11-02: 650 mg via ORAL

## 2019-11-02 MED ORDER — HEPARIN SOD (PORK) LOCK FLUSH 100 UNIT/ML IV SOLN
500.0000 [IU] | Freq: Once | INTRAVENOUS | Status: AC | PRN
  Administered 2019-11-02: 500 [IU]
  Filled 2019-11-02: qty 5

## 2019-11-02 MED ORDER — LORAZEPAM 2 MG/ML IJ SOLN
INTRAMUSCULAR | Status: AC
  Filled 2019-11-02: qty 1

## 2019-11-02 MED ORDER — LORAZEPAM 2 MG/ML IJ SOLN
0.5000 mg | Freq: Once | INTRAMUSCULAR | Status: AC
  Administered 2019-11-02: 0.5 mg via INTRAVENOUS

## 2019-11-02 MED ORDER — SODIUM CHLORIDE 0.9 % IV SOLN
Freq: Once | INTRAVENOUS | Status: AC
  Filled 2019-11-02: qty 250

## 2019-11-02 MED ORDER — DIPHENHYDRAMINE HCL 25 MG PO CAPS
ORAL_CAPSULE | ORAL | Status: AC
  Filled 2019-11-02: qty 1

## 2019-11-02 NOTE — Patient Instructions (Signed)
[facility] Discharge Instructions for Patients Receiving Chemotherapy  Today you received the following chemotherapy agents: Trastuzumab   To help prevent nausea and vomiting after your treatment, we encourage you to take your nausea medication  as prescribed.    If you develop nausea and vomiting that is not controlled by your nausea medication, call the clinic.   BELOW ARE SYMPTOMS THAT SHOULD BE REPORTED IMMEDIATELY:  *FEVER GREATER THAN 100.5 F  *CHILLS WITH OR WITHOUT FEVER  NAUSEA AND VOMITING THAT IS NOT CONTROLLED WITH YOUR NAUSEA MEDICATION  *UNUSUAL SHORTNESS OF BREATH  *UNUSUAL BRUISING OR BLEEDING  TENDERNESS IN MOUTH AND THROAT WITH OR WITHOUT PRESENCE OF ULCERS  *URINARY PROBLEMS  *BOWEL PROBLEMS  UNUSUAL RASH Items with * indicate a potential emergency and should be followed up as soon as possible.  Feel free to call the clinic should you have any questions or concerns. The clinic phone number is [phone].  Please show the CHEMO ALERT CARD at check-in to the Emergency Department and triage nurse.   

## 2019-11-05 ENCOUNTER — Other Ambulatory Visit: Payer: Self-pay | Admitting: Family Medicine

## 2019-11-08 ENCOUNTER — Other Ambulatory Visit: Payer: Self-pay

## 2019-11-08 ENCOUNTER — Encounter: Payer: Self-pay | Admitting: Family Medicine

## 2019-11-08 ENCOUNTER — Ambulatory Visit (INDEPENDENT_AMBULATORY_CARE_PROVIDER_SITE_OTHER): Payer: Medicare Other | Admitting: Family Medicine

## 2019-11-08 VITALS — BP 151/65 | HR 66 | Wt 320.0 lb

## 2019-11-08 DIAGNOSIS — E1149 Type 2 diabetes mellitus with other diabetic neurological complication: Secondary | ICD-10-CM

## 2019-11-08 DIAGNOSIS — G4730 Sleep apnea, unspecified: Secondary | ICD-10-CM | POA: Diagnosis not present

## 2019-11-08 DIAGNOSIS — Z13220 Encounter for screening for lipoid disorders: Secondary | ICD-10-CM | POA: Diagnosis not present

## 2019-11-08 DIAGNOSIS — M4710 Other spondylosis with myelopathy, site unspecified: Secondary | ICD-10-CM | POA: Diagnosis not present

## 2019-11-08 DIAGNOSIS — I10 Essential (primary) hypertension: Secondary | ICD-10-CM | POA: Diagnosis not present

## 2019-11-08 LAB — POCT GLYCOSYLATED HEMOGLOBIN (HGB A1C): HbA1c, POC (controlled diabetic range): 8.8 % — AB (ref 0.0–7.0)

## 2019-11-08 MED ORDER — EMPAGLIFLOZIN 10 MG PO TABS: 10.0000 mg | ORAL_TABLET | Freq: Every day | ORAL | 3 refills | Status: DC

## 2019-11-08 NOTE — Assessment & Plan Note (Signed)
Not taking amlodipine.  May need to start ARB if blood pressure remains elevated

## 2019-11-08 NOTE — Assessment & Plan Note (Signed)
May be the cause of her insomnia.  Will try to encourage retry CPAP.

## 2019-11-08 NOTE — Assessment & Plan Note (Signed)
Not at A1c goal.  Will start empagliflozin for heart protection and hopefully some weight loss.  Will also hope to start at GLP1 in future and wean off insulin

## 2019-11-08 NOTE — Progress Notes (Signed)
    SUBJECTIVE:   CHIEF COMPLAINT / HPI:   [name] comes in for follow up after several years  DIABETES Her am blood sugar are in 200s.  Takes Lantus 28 u in am and Humalog 16-14-14 most days.   No low blood sugars.  Does not want to take metformin.     LEG PAIN Lots of pain in both legs - feels like bee stings and also her long standing back pain.   Takes 900 mg gabapentin along with bendadryl at night and sometimes 4 tabs of 300 mg gabapentin during the day.  She feels she needs oxycontin to help with the pain.    IMMOBILITY Uses a WC and walker and sleeps in recliner.  Has hospital bed but can't get into it.  No recent falls  INSOMNIA Wakes up frequently.  Wears O2 at night.  Could not tolerate CPAP    PERTINENT  PMH / PSH: metastatic breast cancer  OBJECTIVE:   BP (!) 151/65   Pulse 66   Wt (!) 320 lb (145.2 kg)   SpO2 98%   BMI 54.93 kg/m   Able to stand on her own and walk around room without assistance or distress Heart - Regular rate and rhythm.  No murmurs, gallops or rubs.    Lungs:  Normal respiratory effort, chest expands symmetrically. Lungs are clear to auscultation, no crackles or wheezes. Extrem - lipidedema.  No pitting.  Sensation intact to monofilament testing both feet   ASSESSMENT/PLAN:   Diabetes mellitus type 2 with neurological manifestations ([place]) Not at A1c goal.  Will start empagliflozin for heart protection and hopefully some weight loss.  Will also hope to start at GLP1 in future and wean off insulin   HYPERTENSION, BENIGN SYSTEMIC Not taking amlodipine.  May need to start ARB if blood pressure remains elevated  Sleep apnea May be the cause of her insomnia.  Will try to encourage retry CPAP.    Osteoarthritis of spine Has chronic low back pain now with neuropathic sounding symptoms (stinging)  Some relief with gabapentin.  Does not have sensation loss.   She really wants narcotic pain medications but this would be poor care.  Will  encourage Physical Therapy and better sleep and may need depression anxiety therapy which could be contributing    Encouraged Covid vaccine - she refuses   [name], MD [place]

## 2019-11-08 NOTE — Assessment & Plan Note (Signed)
Has chronic low back pain now with neuropathic sounding symptoms (stinging)  Some relief with gabapentin.  Does not have sensation loss.   She really wants narcotic pain medications but this would be poor care.  Will encourage Physical Therapy and better sleep and may need depression anxiety therapy which could be contributing

## 2019-11-08 NOTE — Patient Instructions (Signed)
Good to see you today!  Thanks for coming in.  For the Diabetes   Lantus - 26 units each AM  Humalog - 10 u three times a day do not go over  Emagliflozin 10 mg in the AM  We will check your cholesterol today and talk about it next visit  For the Pain  Can take 2 gabapentin in the AM and the middle of the day and take 3 in  evening  Avoid taking benadryl with it  BRING IN YOUR METER AND ALL YOUR PILL BOXES NEXT VISIT  Next visit we will work on your pain and your blood pressure and your sleep  Come back in one month

## 2019-11-09 LAB — LIPID PANEL
Chol/HDL Ratio: 2.3 ratio (ref 0.0–4.4)
Cholesterol, Total: 150 mg/dL (ref 100–199)
HDL: 66 mg/dL (ref >39)
LDL Chol Calc (NIH): 60 mg/dL (ref 0–99)
Triglycerides: 139 mg/dL (ref 0–149)
VLDL Cholesterol Cal: 24 mg/dL (ref 5–40)

## 2019-11-17 ENCOUNTER — Telehealth: Payer: Self-pay | Admitting: *Deleted

## 2019-11-17 DIAGNOSIS — M25569 Pain in unspecified knee: Secondary | ICD-10-CM | POA: Diagnosis not present

## 2019-11-17 DIAGNOSIS — R062 Wheezing: Secondary | ICD-10-CM | POA: Diagnosis not present

## 2019-11-17 DIAGNOSIS — C50912 Malignant neoplasm of unspecified site of left female breast: Secondary | ICD-10-CM | POA: Diagnosis not present

## 2019-11-17 DIAGNOSIS — C3490 Malignant neoplasm of unspecified part of unspecified bronchus or lung: Secondary | ICD-10-CM | POA: Diagnosis not present

## 2019-11-17 DIAGNOSIS — I5020 Unspecified systolic (congestive) heart failure: Secondary | ICD-10-CM | POA: Diagnosis not present

## 2019-11-17 MED ORDER — GABAPENTIN 300 MG PO CAPS: ORAL_CAPSULE | ORAL | 0 refills | Status: DC

## 2019-11-17 NOTE — Telephone Encounter (Signed)
New Rx sent in

## 2019-11-17 NOTE — Telephone Encounter (Signed)
Pt states that Dr. Erin Hearing told her to increase her gabapentin which she did, but she needs a new script with the increased dose. Christen Bame, CMA

## 2019-11-18 ENCOUNTER — Other Ambulatory Visit: Payer: Self-pay | Admitting: *Deleted

## 2019-11-24 NOTE — Progress Notes (Signed)
Pharmacist Chemotherapy Monitoring - Follow Up Assessment    I verify that I have reviewed each item in the below checklist:  . Regimen for the patient is scheduled for the appropriate day and plan matches scheduled date. Marland Kitchen Appropriate non-routine labs are ordered dependent on drug ordered. . If applicable, additional medications reviewed and ordered per protocol based on lifetime cumulative doses and/or treatment regimen.   Plan for follow-up and/or issues identified: No . I-vent associated with next due treatment: No .   [name] D 11/24/2019 4:31 PM

## 2019-11-30 ENCOUNTER — Other Ambulatory Visit: Payer: Self-pay

## 2019-11-30 ENCOUNTER — Inpatient Hospital Stay: Payer: Medicare Other

## 2019-11-30 ENCOUNTER — Other Ambulatory Visit: Payer: Medicare Other

## 2019-11-30 ENCOUNTER — Ambulatory Visit: Payer: Medicare Other

## 2019-11-30 ENCOUNTER — Inpatient Hospital Stay: Payer: Medicare Other | Attending: Oncology

## 2019-11-30 VITALS — BP 136/65 | HR 66 | Temp 98.7°F | Resp 19

## 2019-11-30 DIAGNOSIS — C50212 Malignant neoplasm of upper-inner quadrant of left female breast: Secondary | ICD-10-CM

## 2019-11-30 DIAGNOSIS — Z5112 Encounter for antineoplastic immunotherapy: Secondary | ICD-10-CM | POA: Insufficient documentation

## 2019-11-30 DIAGNOSIS — Z170 Estrogen receptor positive status [ER+]: Secondary | ICD-10-CM

## 2019-11-30 DIAGNOSIS — C7800 Secondary malignant neoplasm of unspecified lung: Secondary | ICD-10-CM | POA: Insufficient documentation

## 2019-11-30 DIAGNOSIS — Z171 Estrogen receptor negative status [ER-]: Secondary | ICD-10-CM | POA: Diagnosis not present

## 2019-11-30 DIAGNOSIS — Z95828 Presence of other vascular implants and grafts: Secondary | ICD-10-CM

## 2019-11-30 LAB — CBC WITH DIFFERENTIAL (CANCER CENTER ONLY)
Abs Immature Granulocytes: 0.02 10*3/uL (ref 0.00–0.07)
Basophils Absolute: 0 10*3/uL (ref 0.0–0.1)
Basophils Relative: 0 %
Eosinophils Absolute: 0.1 10*3/uL (ref 0.0–0.5)
Eosinophils Relative: 2 %
HCT: 45.8 % (ref 36.0–46.0)
Hemoglobin: 14.4 g/dL (ref 12.0–15.0)
Immature Granulocytes: 0 %
Lymphocytes Relative: 30 %
Lymphs Abs: 2.2 10*3/uL (ref 0.7–4.0)
MCH: 28 pg (ref 26.0–34.0)
MCHC: 31.4 g/dL (ref 30.0–36.0)
MCV: 88.9 fL (ref 80.0–100.0)
Monocytes Absolute: 0.5 10*3/uL (ref 0.1–1.0)
Monocytes Relative: 7 %
Neutro Abs: 4.5 10*3/uL (ref 1.7–7.7)
Neutrophils Relative %: 61 %
Platelet Count: 242 10*3/uL (ref 150–400)
RBC: 5.15 MIL/uL — ABNORMAL HIGH (ref 3.87–5.11)
RDW: 14.1 % (ref 11.5–15.5)
WBC Count: 7.3 10*3/uL (ref 4.0–10.5)
nRBC: 0 % (ref 0.0–0.2)

## 2019-11-30 LAB — CMP (CANCER CENTER ONLY)
ALT: 46 U/L — ABNORMAL HIGH (ref 0–44)
AST: 22 U/L (ref 15–41)
Albumin: 3 g/dL — ABNORMAL LOW (ref 3.5–5.0)
Alkaline Phosphatase: 115 U/L (ref 38–126)
Anion gap: 8 (ref 5–15)
BUN: 19 mg/dL (ref 8–23)
CO2: 32 mmol/L (ref 22–32)
Calcium: 9.6 mg/dL (ref 8.9–10.3)
Chloride: 103 mmol/L (ref 98–111)
Creatinine: 0.84 mg/dL (ref 0.44–1.00)
GFR, Est AFR Am: >60 mL/min (ref >60)
GFR, Estimated: >60 mL/min (ref >60)
Glucose, Bld: 216 mg/dL — ABNORMAL HIGH (ref 70–99)
Potassium: 4.4 mmol/L (ref 3.5–5.1)
Sodium: 143 mmol/L (ref 135–145)
Total Bilirubin: 0.2 mg/dL — ABNORMAL LOW (ref 0.3–1.2)
Total Protein: 7.4 g/dL (ref 6.5–8.1)

## 2019-11-30 MED ORDER — HEPARIN SOD (PORK) LOCK FLUSH 100 UNIT/ML IV SOLN
500.0000 [IU] | Freq: Once | INTRAVENOUS | Status: AC
  Administered 2019-11-30: 500 [IU] via INTRAVENOUS
  Filled 2019-11-30: qty 5

## 2019-11-30 MED ORDER — HEPARIN SOD (PORK) LOCK FLUSH 100 UNIT/ML IV SOLN
500.0000 [IU] | Freq: Once | INTRAVENOUS | Status: DC | PRN
  Filled 2019-11-30: qty 5

## 2019-11-30 MED ORDER — DIPHENHYDRAMINE HCL 25 MG PO CAPS
ORAL_CAPSULE | ORAL | Status: AC
  Filled 2019-11-30: qty 1

## 2019-11-30 MED ORDER — SODIUM CHLORIDE 0.9% FLUSH
10.0000 mL | INTRAVENOUS | Status: DC | PRN
  Filled 2019-11-30: qty 10

## 2019-11-30 MED ORDER — DIPHENHYDRAMINE HCL 25 MG PO CAPS
25.0000 mg | ORAL_CAPSULE | Freq: Once | ORAL | Status: AC
  Administered 2019-11-30: 25 mg via ORAL

## 2019-11-30 MED ORDER — TRASTUZUMAB-ANNS CHEMO 150 MG IV SOLR
750.0000 mg | Freq: Once | INTRAVENOUS | Status: AC
  Administered 2019-11-30: 750 mg via INTRAVENOUS
  Filled 2019-11-30: qty 35.72

## 2019-11-30 MED ORDER — ACETAMINOPHEN 325 MG PO TABS
650.0000 mg | ORAL_TABLET | Freq: Once | ORAL | Status: AC
  Administered 2019-11-30: 650 mg via ORAL

## 2019-11-30 MED ORDER — LORAZEPAM 2 MG/ML IJ SOLN
0.5000 mg | Freq: Once | INTRAMUSCULAR | Status: AC
  Administered 2019-11-30: 0.5 mg via INTRAVENOUS

## 2019-11-30 MED ORDER — ACETAMINOPHEN 325 MG PO TABS
ORAL_TABLET | ORAL | Status: AC
  Filled 2019-11-30: qty 2

## 2019-11-30 MED ORDER — SODIUM CHLORIDE 0.9 % IV SOLN
Freq: Once | INTRAVENOUS | Status: AC
  Filled 2019-11-30: qty 250

## 2019-11-30 MED ORDER — SODIUM CHLORIDE 0.9% FLUSH
10.0000 mL | Freq: Once | INTRAVENOUS | Status: AC
  Administered 2019-11-30: 10 mL via INTRAVENOUS
  Filled 2019-11-30: qty 10

## 2019-11-30 MED ORDER — LORAZEPAM 2 MG/ML IJ SOLN
INTRAMUSCULAR | Status: AC
  Filled 2019-11-30: qty 1

## 2019-11-30 NOTE — Patient Instructions (Signed)
[facility] Discharge Instructions for Patients Receiving Chemotherapy  Today you received the following chemotherapy agents: Trastuzumab   To help prevent nausea and vomiting after your treatment, we encourage you to take your nausea medication  as prescribed.    If you develop nausea and vomiting that is not controlled by your nausea medication, call the clinic.   BELOW ARE SYMPTOMS THAT SHOULD BE REPORTED IMMEDIATELY:  *FEVER GREATER THAN 100.5 F  *CHILLS WITH OR WITHOUT FEVER  NAUSEA AND VOMITING THAT IS NOT CONTROLLED WITH YOUR NAUSEA MEDICATION  *UNUSUAL SHORTNESS OF BREATH  *UNUSUAL BRUISING OR BLEEDING  TENDERNESS IN MOUTH AND THROAT WITH OR WITHOUT PRESENCE OF ULCERS  *URINARY PROBLEMS  *BOWEL PROBLEMS  UNUSUAL RASH Items with * indicate a potential emergency and should be followed up as soon as possible.  Feel free to call the clinic should you have any questions or concerns. The clinic phone number is [phone].  Please show the CHEMO ALERT CARD at check-in to the Emergency Department and triage nurse.   

## 2019-11-30 NOTE — Progress Notes (Signed)
MD Magrinat would like flat dose of 750 mg of Kanjinti from now on.  Larene Beach, PharmD

## 2019-12-01 ENCOUNTER — Encounter: Payer: Self-pay | Admitting: Licensed Clinical Social Worker

## 2019-12-01 NOTE — Progress Notes (Signed)
[place] Work  Clinical Social Work was referred by [place] manager, Peggye Pitt for assessment of psychosocial needs.  Patient reported to lab tech that she is about to be put out of her home and she is also responsible for care of her grandchildren.  CSW attempted to contact patient via phone. No answer, voicemail was full, so unable to leave message.      Zakeria Kulzer, [place], [place] Worker [facility]

## 2019-12-05 ENCOUNTER — Telehealth: Payer: Self-pay | Admitting: Licensed Clinical Social Worker

## 2019-12-05 NOTE — Telephone Encounter (Signed)
[place] Clinical Social Work  CSW made another attempt to contact patient regarding resource needs. No answer, VM full so unable to leave message. Patient can be re-referred if needed at next appointment.   Edwinna Areola Stoisits, LCSW

## 2019-12-06 ENCOUNTER — Ambulatory Visit: Payer: Medicare Other | Admitting: Family Medicine

## 2019-12-07 ENCOUNTER — Other Ambulatory Visit: Payer: Self-pay | Admitting: Family Medicine

## 2019-12-17 DIAGNOSIS — C50912 Malignant neoplasm of unspecified site of left female breast: Secondary | ICD-10-CM | POA: Diagnosis not present

## 2019-12-17 DIAGNOSIS — I5020 Unspecified systolic (congestive) heart failure: Secondary | ICD-10-CM | POA: Diagnosis not present

## 2019-12-17 DIAGNOSIS — M25569 Pain in unspecified knee: Secondary | ICD-10-CM | POA: Diagnosis not present

## 2019-12-17 DIAGNOSIS — R062 Wheezing: Secondary | ICD-10-CM | POA: Diagnosis not present

## 2019-12-17 DIAGNOSIS — C3490 Malignant neoplasm of unspecified part of unspecified bronchus or lung: Secondary | ICD-10-CM | POA: Diagnosis not present

## 2019-12-22 NOTE — Progress Notes (Signed)
Pharmacist Chemotherapy Monitoring - Follow Up Assessment    I verify that I have reviewed each item in the below checklist:  . Regimen for the patient is scheduled for the appropriate day and plan matches scheduled date. Marland Kitchen Appropriate non-routine labs are ordered dependent on drug ordered. . If applicable, additional medications reviewed and ordered per protocol based on lifetime cumulative doses and/or treatment regimen.   Plan for follow-up and/or issues identified: No . I-vent associated with next due treatment: No . MD and/or nursing notified: No  Terissa Haffey K 12/22/2019 8:51 AM

## 2019-12-28 ENCOUNTER — Inpatient Hospital Stay: Payer: Medicare Other | Attending: Oncology

## 2019-12-28 ENCOUNTER — Other Ambulatory Visit: Payer: Self-pay

## 2019-12-28 ENCOUNTER — Ambulatory Visit: Payer: Medicare Other

## 2019-12-28 ENCOUNTER — Other Ambulatory Visit: Payer: Medicare Other

## 2019-12-28 ENCOUNTER — Inpatient Hospital Stay: Payer: Medicare Other

## 2019-12-28 VITALS — BP 175/77 | HR 60 | Temp 98.5°F | Resp 18

## 2019-12-28 DIAGNOSIS — Z86718 Personal history of other venous thrombosis and embolism: Secondary | ICD-10-CM | POA: Insufficient documentation

## 2019-12-28 DIAGNOSIS — Z7982 Long term (current) use of aspirin: Secondary | ICD-10-CM | POA: Diagnosis not present

## 2019-12-28 DIAGNOSIS — Z87891 Personal history of nicotine dependence: Secondary | ICD-10-CM | POA: Diagnosis not present

## 2019-12-28 DIAGNOSIS — Z794 Long term (current) use of insulin: Secondary | ICD-10-CM | POA: Insufficient documentation

## 2019-12-28 DIAGNOSIS — E119 Type 2 diabetes mellitus without complications: Secondary | ICD-10-CM | POA: Diagnosis not present

## 2019-12-28 DIAGNOSIS — Z170 Estrogen receptor positive status [ER+]: Secondary | ICD-10-CM | POA: Diagnosis not present

## 2019-12-28 DIAGNOSIS — Z79899 Other long term (current) drug therapy: Secondary | ICD-10-CM | POA: Insufficient documentation

## 2019-12-28 DIAGNOSIS — Z95828 Presence of other vascular implants and grafts: Secondary | ICD-10-CM

## 2019-12-28 DIAGNOSIS — C50212 Malignant neoplasm of upper-inner quadrant of left female breast: Secondary | ICD-10-CM | POA: Insufficient documentation

## 2019-12-28 DIAGNOSIS — Z803 Family history of malignant neoplasm of breast: Secondary | ICD-10-CM | POA: Insufficient documentation

## 2019-12-28 DIAGNOSIS — Z5112 Encounter for antineoplastic immunotherapy: Secondary | ICD-10-CM | POA: Diagnosis not present

## 2019-12-28 DIAGNOSIS — Z8249 Family history of ischemic heart disease and other diseases of the circulatory system: Secondary | ICD-10-CM | POA: Insufficient documentation

## 2019-12-28 DIAGNOSIS — Z8041 Family history of malignant neoplasm of ovary: Secondary | ICD-10-CM | POA: Diagnosis not present

## 2019-12-28 DIAGNOSIS — I10 Essential (primary) hypertension: Secondary | ICD-10-CM | POA: Diagnosis not present

## 2019-12-28 DIAGNOSIS — C7800 Secondary malignant neoplasm of unspecified lung: Secondary | ICD-10-CM | POA: Insufficient documentation

## 2019-12-28 LAB — CBC WITH DIFFERENTIAL (CANCER CENTER ONLY)
Abs Immature Granulocytes: 0.02 10*3/uL (ref 0.00–0.07)
Basophils Absolute: 0 10*3/uL (ref 0.0–0.1)
Basophils Relative: 0 %
Eosinophils Absolute: 0.1 10*3/uL (ref 0.0–0.5)
Eosinophils Relative: 2 %
HCT: 46.2 % — ABNORMAL HIGH (ref 36.0–46.0)
Hemoglobin: 14.5 g/dL (ref 12.0–15.0)
Immature Granulocytes: 0 %
Lymphocytes Relative: 30 %
Lymphs Abs: 2.2 10*3/uL (ref 0.7–4.0)
MCH: 27.7 pg (ref 26.0–34.0)
MCHC: 31.4 g/dL (ref 30.0–36.0)
MCV: 88.3 fL (ref 80.0–100.0)
Monocytes Absolute: 0.5 10*3/uL (ref 0.1–1.0)
Monocytes Relative: 6 %
Neutro Abs: 4.4 10*3/uL (ref 1.7–7.7)
Neutrophils Relative %: 62 %
Platelet Count: 221 10*3/uL (ref 150–400)
RBC: 5.23 MIL/uL — ABNORMAL HIGH (ref 3.87–5.11)
RDW: 14 % (ref 11.5–15.5)
WBC Count: 7.3 10*3/uL (ref 4.0–10.5)
nRBC: 0 % (ref 0.0–0.2)

## 2019-12-28 LAB — CMP (CANCER CENTER ONLY)
ALT: 51 U/L — ABNORMAL HIGH (ref 0–44)
AST: 30 U/L (ref 15–41)
Albumin: 3.1 g/dL — ABNORMAL LOW (ref 3.5–5.0)
Alkaline Phosphatase: 103 U/L (ref 38–126)
Anion gap: 11 (ref 5–15)
BUN: 15 mg/dL (ref 8–23)
CO2: 31 mmol/L (ref 22–32)
Calcium: 9.3 mg/dL (ref 8.9–10.3)
Chloride: 101 mmol/L (ref 98–111)
Creatinine: 0.79 mg/dL (ref 0.44–1.00)
GFR, Est AFR Am: >60 mL/min (ref >60)
GFR, Estimated: >60 mL/min (ref >60)
Glucose, Bld: 168 mg/dL — ABNORMAL HIGH (ref 70–99)
Potassium: 4.2 mmol/L (ref 3.5–5.1)
Sodium: 143 mmol/L (ref 135–145)
Total Bilirubin: 0.3 mg/dL (ref 0.3–1.2)
Total Protein: 7.4 g/dL (ref 6.5–8.1)

## 2019-12-28 MED ORDER — ACETAMINOPHEN 325 MG PO TABS
ORAL_TABLET | ORAL | Status: AC
  Filled 2019-12-28: qty 2

## 2019-12-28 MED ORDER — DIPHENHYDRAMINE HCL 25 MG PO CAPS
ORAL_CAPSULE | ORAL | Status: AC
  Filled 2019-12-28: qty 1

## 2019-12-28 MED ORDER — HEPARIN SOD (PORK) LOCK FLUSH 100 UNIT/ML IV SOLN
500.0000 [IU] | Freq: Once | INTRAVENOUS | Status: AC | PRN
  Administered 2019-12-28: 500 [IU]
  Filled 2019-12-28: qty 5

## 2019-12-28 MED ORDER — SODIUM CHLORIDE 0.9% FLUSH
10.0000 mL | INTRAVENOUS | Status: DC | PRN
  Administered 2019-12-28: 10 mL via INTRAVENOUS
  Filled 2019-12-28: qty 10

## 2019-12-28 MED ORDER — TRASTUZUMAB-ANNS CHEMO 150 MG IV SOLR
750.0000 mg | Freq: Once | INTRAVENOUS | Status: AC
  Administered 2019-12-28: 750 mg via INTRAVENOUS
  Filled 2019-12-28: qty 35.72

## 2019-12-28 MED ORDER — SODIUM CHLORIDE 0.9 % IV SOLN
Freq: Once | INTRAVENOUS | Status: AC
  Filled 2019-12-28: qty 250

## 2019-12-28 MED ORDER — LORAZEPAM 2 MG/ML IJ SOLN
INTRAMUSCULAR | Status: AC
  Filled 2019-12-28: qty 1

## 2019-12-28 MED ORDER — LORAZEPAM 2 MG/ML IJ SOLN
0.5000 mg | Freq: Once | INTRAMUSCULAR | Status: AC
  Administered 2019-12-28: 0.5 mg via INTRAVENOUS

## 2019-12-28 MED ORDER — SODIUM CHLORIDE 0.9% FLUSH
10.0000 mL | Freq: Once | INTRAVENOUS | Status: AC
  Administered 2019-12-28: 10 mL via INTRAVENOUS
  Filled 2019-12-28: qty 10

## 2019-12-28 MED ORDER — ACETAMINOPHEN 325 MG PO TABS
650.0000 mg | ORAL_TABLET | Freq: Once | ORAL | Status: AC
  Administered 2019-12-28: 650 mg via ORAL

## 2019-12-28 MED ORDER — DIPHENHYDRAMINE HCL 25 MG PO CAPS
25.0000 mg | ORAL_CAPSULE | Freq: Once | ORAL | Status: AC
  Administered 2019-12-28: 25 mg via ORAL

## 2019-12-28 NOTE — Patient Instructions (Signed)
[place] Discharge Instructions for Patients Receiving Chemotherapy  Today you received the following chemotherapy agents: Kanjinti.  To help prevent nausea and vomiting after your treatment, we encourage you to take your nausea medication as directed.   If you develop nausea and vomiting that is not controlled by your nausea medication, call the clinic.   BELOW ARE SYMPTOMS THAT SHOULD BE REPORTED IMMEDIATELY:  *FEVER GREATER THAN 100.5 F  *CHILLS WITH OR WITHOUT FEVER  NAUSEA AND VOMITING THAT IS NOT CONTROLLED WITH YOUR NAUSEA MEDICATION  *UNUSUAL SHORTNESS OF BREATH  *UNUSUAL BRUISING OR BLEEDING  TENDERNESS IN MOUTH AND THROAT WITH OR WITHOUT PRESENCE OF ULCERS  *URINARY PROBLEMS  *BOWEL PROBLEMS  UNUSUAL RASH Items with * indicate a potential emergency and should be followed up as soon as possible.  Feel free to call the clinic should you have any questions or concerns. The clinic phone number is [phone].  Please show the [place] at check-in to the Emergency Department and triage nurse.

## 2019-12-30 DIAGNOSIS — I5020 Unspecified systolic (congestive) heart failure: Secondary | ICD-10-CM | POA: Diagnosis not present

## 2020-01-03 ENCOUNTER — Encounter: Payer: Self-pay | Admitting: Licensed Clinical Social Worker

## 2020-01-03 NOTE — Progress Notes (Signed)
[place] CSW Progress Note  Clinical Education officer, museum received message from patient stating that she needs help finding new housing as trailer is being repossessed. CSW attempted to contact again. No answer, VM full so unable to leave message. CSW will mail information to patient on local housing authority information and ways to search for low-cost rental properties. Patient welcome to call back.    Edwinna Areola Elexius Minar , LCSW

## 2020-01-04 ENCOUNTER — Encounter: Payer: Self-pay | Admitting: Licensed Clinical Social Worker

## 2020-01-04 NOTE — Progress Notes (Signed)
[place] CSW Progress Note  Clinical Education officer, museum contacted patient by phone to follow up on housing concerns. Per pt, landlord is increasing rent and she can no longer afford it as her son lost his job. She states that she has been in contact with Karilyn Cota at [employer] and thought that she was told 2 units were available. However, she received a letter stating that there was an 18-24 month wait. Patient requested that this CSW contact Kennyth Lose at [phone]. CSW LVM for Ms. Schneider.  CSW also shared resources for emergency rental assistance [phone]) and Legal Aid to help with eviction concern. CSW also informed patient that additional information was mailed yesterday.     Edwinna Areola Kayce Betty , LCSW

## 2020-01-05 ENCOUNTER — Telehealth: Payer: Self-pay | Admitting: General Practice

## 2020-01-05 NOTE — Telephone Encounter (Signed)
[place] CSW Progress Notes  Patient has left messages on CSW [place]/Valentino Saavedra phone - her assigned social worker is now [employer]  Spoke w patient and provided her w name/contact information for CSW Stoisits.  Encouraged her to reach out w needs.  Per patient, she is in need of emergency housing and is being evicted - she is concerned because funds are limited, family member has lost a job and she herself has cancer and is on oxygen.  CSW Stoisits is aware and has reached out to patient - encouraged patient to both call CSW Stoisits directly and to clear her VM which is often full and cannot accept messages.  Edwyna Shell, LCSW Clinical Social Worker Phone:  [phone] Cell:  [phone]

## 2020-01-08 DIAGNOSIS — C50912 Malignant neoplasm of unspecified site of left female breast: Secondary | ICD-10-CM | POA: Diagnosis not present

## 2020-01-08 DIAGNOSIS — C3490 Malignant neoplasm of unspecified part of unspecified bronchus or lung: Secondary | ICD-10-CM | POA: Diagnosis not present

## 2020-01-08 DIAGNOSIS — R062 Wheezing: Secondary | ICD-10-CM | POA: Diagnosis not present

## 2020-01-08 DIAGNOSIS — M25569 Pain in unspecified knee: Secondary | ICD-10-CM | POA: Diagnosis not present

## 2020-01-08 DIAGNOSIS — I5020 Unspecified systolic (congestive) heart failure: Secondary | ICD-10-CM | POA: Diagnosis not present

## 2020-01-11 ENCOUNTER — Encounter: Payer: Self-pay | Admitting: Licensed Clinical Social Worker
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
## 2020-01-15 ENCOUNTER — Other Ambulatory Visit: Payer: Self-pay | Admitting: Family Medicine

## 2020-01-19 NOTE — Progress Notes (Signed)
Pharmacist Chemotherapy Monitoring - Follow Up Assessment    I verify that I have reviewed each item in the below checklist:  . Regimen for the patient is scheduled for the appropriate day and plan matches scheduled date. Marland Kitchen Appropriate non-routine labs are ordered dependent on drug ordered. . If applicable, additional medications reviewed and ordered per protocol based on lifetime cumulative doses and/or treatment regimen.   Plan for follow-up and/or issues identified: No . I-vent associated with next due treatment: No . MD and/or nursing notified: No  Britt Boozer 01/19/2020 8:32 AM

## 2020-01-21 DIAGNOSIS — C3490 Malignant neoplasm of unspecified part of unspecified bronchus or lung: Secondary | ICD-10-CM | POA: Diagnosis not present

## 2020-01-21 DIAGNOSIS — C50912 Malignant neoplasm of unspecified site of left female breast: Secondary | ICD-10-CM | POA: Diagnosis not present

## 2020-01-21 DIAGNOSIS — R062 Wheezing: Secondary | ICD-10-CM | POA: Diagnosis not present

## 2020-01-21 DIAGNOSIS — M25569 Pain in unspecified knee: Secondary | ICD-10-CM | POA: Diagnosis not present

## 2020-01-21 DIAGNOSIS — I5020 Unspecified systolic (congestive) heart failure: Secondary | ICD-10-CM | POA: Diagnosis not present

## 2020-01-25 ENCOUNTER — Telehealth: Payer: Self-pay | Admitting: Oncology

## 2020-01-25 ENCOUNTER — Inpatient Hospital Stay (HOSPITAL_BASED_OUTPATIENT_CLINIC_OR_DEPARTMENT_OTHER): Payer: Medicare Other | Admitting: Oncology

## 2020-01-25 ENCOUNTER — Inpatient Hospital Stay: Payer: Medicare Other

## 2020-01-25 ENCOUNTER — Inpatient Hospital Stay: Payer: Medicare Other | Attending: Oncology

## 2020-01-25 ENCOUNTER — Other Ambulatory Visit: Payer: Self-pay

## 2020-01-25 VITALS — BP 172/74 | HR 71 | Temp 99.1°F | Resp 18 | Ht 64.0 in | Wt 320.6 lb

## 2020-01-25 DIAGNOSIS — Z79899 Other long term (current) drug therapy: Secondary | ICD-10-CM | POA: Diagnosis not present

## 2020-01-25 DIAGNOSIS — C7800 Secondary malignant neoplasm of unspecified lung: Secondary | ICD-10-CM

## 2020-01-25 DIAGNOSIS — E1149 Type 2 diabetes mellitus with other diabetic neurological complication: Secondary | ICD-10-CM

## 2020-01-25 DIAGNOSIS — Z5112 Encounter for antineoplastic immunotherapy: Secondary | ICD-10-CM | POA: Diagnosis not present

## 2020-01-25 DIAGNOSIS — Z95828 Presence of other vascular implants and grafts: Secondary | ICD-10-CM

## 2020-01-25 DIAGNOSIS — C50212 Malignant neoplasm of upper-inner quadrant of left female breast: Secondary | ICD-10-CM

## 2020-01-25 DIAGNOSIS — Z170 Estrogen receptor positive status [ER+]: Secondary | ICD-10-CM | POA: Diagnosis not present

## 2020-01-25 DIAGNOSIS — Z79811 Long term (current) use of aromatase inhibitors: Secondary | ICD-10-CM | POA: Diagnosis not present

## 2020-01-25 DIAGNOSIS — Z86718 Personal history of other venous thrombosis and embolism: Secondary | ICD-10-CM | POA: Diagnosis not present

## 2020-01-25 DIAGNOSIS — G4739 Other sleep apnea: Secondary | ICD-10-CM | POA: Diagnosis not present

## 2020-01-25 DIAGNOSIS — E119 Type 2 diabetes mellitus without complications: Secondary | ICD-10-CM | POA: Diagnosis not present

## 2020-01-25 DIAGNOSIS — Z794 Long term (current) use of insulin: Secondary | ICD-10-CM | POA: Diagnosis not present

## 2020-01-25 DIAGNOSIS — C50911 Malignant neoplasm of unspecified site of right female breast: Secondary | ICD-10-CM | POA: Diagnosis not present

## 2020-01-25 DIAGNOSIS — M48061 Spinal stenosis, lumbar region without neurogenic claudication: Secondary | ICD-10-CM

## 2020-01-25 DIAGNOSIS — R739 Hyperglycemia, unspecified: Secondary | ICD-10-CM

## 2020-01-25 DIAGNOSIS — I10 Essential (primary) hypertension: Secondary | ICD-10-CM | POA: Diagnosis not present

## 2020-01-25 LAB — CMP (CANCER CENTER ONLY)
ALT: 42 U/L (ref 0–44)
AST: 21 U/L (ref 15–41)
Albumin: 2.9 g/dL — ABNORMAL LOW (ref 3.5–5.0)
Alkaline Phosphatase: 96 U/L (ref 38–126)
Anion gap: 10 (ref 5–15)
BUN: 17 mg/dL (ref 8–23)
CO2: 31 mmol/L (ref 22–32)
Calcium: 9.5 mg/dL (ref 8.9–10.3)
Chloride: 98 mmol/L (ref 98–111)
Creatinine: 0.88 mg/dL (ref 0.44–1.00)
GFR, Est AFR Am: >60 mL/min (ref >60)
GFR, Estimated: >60 mL/min (ref >60)
Glucose, Bld: 368 mg/dL — ABNORMAL HIGH (ref 70–99)
Potassium: 4.4 mmol/L (ref 3.5–5.1)
Sodium: 139 mmol/L (ref 135–145)
Total Bilirubin: 0.3 mg/dL (ref 0.3–1.2)
Total Protein: 7.2 g/dL (ref 6.5–8.1)

## 2020-01-25 LAB — CBC WITH DIFFERENTIAL (CANCER CENTER ONLY)
Abs Immature Granulocytes: 0.03 10*3/uL (ref 0.00–0.07)
Basophils Absolute: 0 10*3/uL (ref 0.0–0.1)
Basophils Relative: 0 %
Eosinophils Absolute: 0.1 10*3/uL (ref 0.0–0.5)
Eosinophils Relative: 2 %
HCT: 44.4 % (ref 36.0–46.0)
Hemoglobin: 14.1 g/dL (ref 12.0–15.0)
Immature Granulocytes: 0 %
Lymphocytes Relative: 26 %
Lymphs Abs: 1.9 10*3/uL (ref 0.7–4.0)
MCH: 27.8 pg (ref 26.0–34.0)
MCHC: 31.8 g/dL (ref 30.0–36.0)
MCV: 87.4 fL (ref 80.0–100.0)
Monocytes Absolute: 0.5 10*3/uL (ref 0.1–1.0)
Monocytes Relative: 7 %
Neutro Abs: 4.6 10*3/uL (ref 1.7–7.7)
Neutrophils Relative %: 65 %
Platelet Count: 202 10*3/uL (ref 150–400)
RBC: 5.08 MIL/uL (ref 3.87–5.11)
RDW: 13.7 % (ref 11.5–15.5)
WBC Count: 7.1 10*3/uL (ref 4.0–10.5)
nRBC: 0 % (ref 0.0–0.2)

## 2020-01-25 MED ORDER — ACETAMINOPHEN 325 MG PO TABS
650.0000 mg | ORAL_TABLET | Freq: Once | ORAL | Status: AC
  Administered 2020-01-25: 650 mg via ORAL

## 2020-01-25 MED ORDER — SODIUM CHLORIDE 0.9 % IV SOLN
Freq: Once | INTRAVENOUS | Status: AC
  Filled 2020-01-25: qty 250

## 2020-01-25 MED ORDER — LORAZEPAM 2 MG/ML IJ SOLN
0.5000 mg | Freq: Once | INTRAMUSCULAR | Status: AC
  Administered 2020-01-25: 0.5 mg via INTRAVENOUS

## 2020-01-25 MED ORDER — TRASTUZUMAB-ANNS CHEMO 150 MG IV SOLR
750.0000 mg | Freq: Once | INTRAVENOUS | Status: AC
  Administered 2020-01-25: 750 mg via INTRAVENOUS
  Filled 2020-01-25: qty 35.72

## 2020-01-25 MED ORDER — INSULIN REGULAR HUMAN 100 UNIT/ML IJ SOLN
6.0000 [IU] | Freq: Once | INTRAMUSCULAR | Status: AC
  Administered 2020-01-25: 6 [IU] via SUBCUTANEOUS

## 2020-01-25 MED ORDER — HEPARIN SOD (PORK) LOCK FLUSH 100 UNIT/ML IV SOLN
500.0000 [IU] | Freq: Once | INTRAVENOUS | Status: AC | PRN
  Administered 2020-01-25: 500 [IU]
  Filled 2020-01-25: qty 5

## 2020-01-25 MED ORDER — LORAZEPAM 2 MG/ML IJ SOLN
INTRAMUSCULAR | Status: AC
  Filled 2020-01-25: qty 1

## 2020-01-25 MED ORDER — DIPHENHYDRAMINE HCL 25 MG PO CAPS
25.0000 mg | ORAL_CAPSULE | Freq: Once | ORAL | Status: AC
  Administered 2020-01-25: 25 mg via ORAL

## 2020-01-25 MED ORDER — SODIUM CHLORIDE 0.9% FLUSH
10.0000 mL | INTRAVENOUS | Status: DC | PRN
  Administered 2020-01-25: 10 mL via INTRAVENOUS
  Filled 2020-01-25: qty 10

## 2020-01-25 MED ORDER — SODIUM CHLORIDE 0.9% FLUSH
10.0000 mL | Freq: Once | INTRAVENOUS | Status: AC
  Administered 2020-01-25: 10 mL via INTRAVENOUS
  Filled 2020-01-25: qty 10

## 2020-01-25 MED ORDER — DIPHENHYDRAMINE HCL 25 MG PO CAPS
ORAL_CAPSULE | ORAL | Status: AC
  Filled 2020-01-25: qty 1

## 2020-01-25 MED ORDER — ACETAMINOPHEN 325 MG PO TABS
ORAL_TABLET | ORAL | Status: AC
  Filled 2020-01-25: qty 2

## 2020-01-25 NOTE — Telephone Encounter (Signed)
Scheduled appts per 6/9 los. Gave pt a print out of appt calendar.

## 2020-01-25 NOTE — Patient Instructions (Signed)
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
## 2020-01-25 NOTE — Patient Instructions (Signed)
[facility] Discharge Instructions for Patients Receiving Chemotherapy  Today you received the following chemotherapy agents: Kanjinti.  To help prevent nausea and vomiting after your treatment, we encourage you to take your nausea medication as directed.   If you develop nausea and vomiting that is not controlled by your nausea medication, call the clinic.   BELOW ARE SYMPTOMS THAT SHOULD BE REPORTED IMMEDIATELY:  *FEVER GREATER THAN 100.5 F  *CHILLS WITH OR WITHOUT FEVER  NAUSEA AND VOMITING THAT IS NOT CONTROLLED WITH YOUR NAUSEA MEDICATION  *UNUSUAL SHORTNESS OF BREATH  *UNUSUAL BRUISING OR BLEEDING  TENDERNESS IN MOUTH AND THROAT WITH OR WITHOUT PRESENCE OF ULCERS  *URINARY PROBLEMS  *BOWEL PROBLEMS  UNUSUAL RASH Items with * indicate a potential emergency and should be followed up as soon as possible.  Feel free to call the clinic should you have any questions or concerns. The clinic phone number is [phone].  Please show the [place] at check-in to the Emergency Department and triage nurse.

## 2020-01-25 NOTE — Progress Notes (Signed)
CBG 368. Patient to receive insulin 6 units today per Dr. Jana Hakim.

## 2020-01-30 ENCOUNTER — Other Ambulatory Visit: Payer: Self-pay | Admitting: Family Medicine

## 2020-01-30 DIAGNOSIS — I5020 Unspecified systolic (congestive) heart failure: Secondary | ICD-10-CM | POA: Diagnosis not present

## 2020-02-08 DIAGNOSIS — C50912 Malignant neoplasm of unspecified site of left female breast: Secondary | ICD-10-CM | POA: Diagnosis not present

## 2020-02-08 DIAGNOSIS — R062 Wheezing: Secondary | ICD-10-CM | POA: Diagnosis not present

## 2020-02-08 DIAGNOSIS — I5020 Unspecified systolic (congestive) heart failure: Secondary | ICD-10-CM | POA: Diagnosis not present

## 2020-02-08 DIAGNOSIS — C3490 Malignant neoplasm of unspecified part of unspecified bronchus or lung: Secondary | ICD-10-CM | POA: Diagnosis not present

## 2020-02-08 DIAGNOSIS — M25569 Pain in unspecified knee: Secondary | ICD-10-CM | POA: Diagnosis not present

## 2020-02-13 ENCOUNTER — Telehealth: Payer: Self-pay | Admitting: Adult Health

## 2020-02-13 NOTE — Telephone Encounter (Signed)
Scheduled per 6/28 staff msg. Called and left a msg. Mailing out appt letter and calendar printout

## 2020-02-15 ENCOUNTER — Other Ambulatory Visit: Payer: Self-pay | Admitting: Adult Health

## 2020-02-16 ENCOUNTER — Encounter: Payer: Self-pay | Admitting: Oncology

## 2020-02-20 DIAGNOSIS — I5020 Unspecified systolic (congestive) heart failure: Secondary | ICD-10-CM | POA: Diagnosis not present

## 2020-02-20 DIAGNOSIS — C50912 Malignant neoplasm of unspecified site of left female breast: Secondary | ICD-10-CM | POA: Diagnosis not present

## 2020-02-20 DIAGNOSIS — M25569 Pain in unspecified knee: Secondary | ICD-10-CM | POA: Diagnosis not present

## 2020-02-20 DIAGNOSIS — C3490 Malignant neoplasm of unspecified part of unspecified bronchus or lung: Secondary | ICD-10-CM | POA: Diagnosis not present

## 2020-02-20 DIAGNOSIS — R062 Wheezing: Secondary | ICD-10-CM | POA: Diagnosis not present

## 2020-02-22 ENCOUNTER — Inpatient Hospital Stay: Payer: Medicare Other | Attending: Oncology

## 2020-02-22 ENCOUNTER — Inpatient Hospital Stay: Payer: Medicare Other

## 2020-02-22 ENCOUNTER — Other Ambulatory Visit: Payer: Self-pay

## 2020-02-22 VITALS — BP 147/77 | HR 63 | Temp 98.4°F | Resp 18 | Wt 320.6 lb

## 2020-02-22 DIAGNOSIS — C50212 Malignant neoplasm of upper-inner quadrant of left female breast: Secondary | ICD-10-CM

## 2020-02-22 DIAGNOSIS — Z170 Estrogen receptor positive status [ER+]: Secondary | ICD-10-CM

## 2020-02-22 DIAGNOSIS — Z5112 Encounter for antineoplastic immunotherapy: Secondary | ICD-10-CM | POA: Insufficient documentation

## 2020-02-22 DIAGNOSIS — Z87891 Personal history of nicotine dependence: Secondary | ICD-10-CM | POA: Insufficient documentation

## 2020-02-22 DIAGNOSIS — C7800 Secondary malignant neoplasm of unspecified lung: Secondary | ICD-10-CM | POA: Diagnosis not present

## 2020-02-22 DIAGNOSIS — Z79811 Long term (current) use of aromatase inhibitors: Secondary | ICD-10-CM | POA: Insufficient documentation

## 2020-02-22 DIAGNOSIS — Z794 Long term (current) use of insulin: Secondary | ICD-10-CM | POA: Insufficient documentation

## 2020-02-22 DIAGNOSIS — Z86718 Personal history of other venous thrombosis and embolism: Secondary | ICD-10-CM | POA: Diagnosis not present

## 2020-02-22 DIAGNOSIS — Z79899 Other long term (current) drug therapy: Secondary | ICD-10-CM | POA: Diagnosis not present

## 2020-02-22 DIAGNOSIS — Z95828 Presence of other vascular implants and grafts: Secondary | ICD-10-CM

## 2020-02-22 LAB — CMP (CANCER CENTER ONLY)
ALT: 65 U/L — ABNORMAL HIGH (ref 0–44)
AST: 37 U/L (ref 15–41)
Albumin: 3 g/dL — ABNORMAL LOW (ref 3.5–5.0)
Alkaline Phosphatase: 112 U/L (ref 38–126)
Anion gap: 7 (ref 5–15)
BUN: 18 mg/dL (ref 8–23)
CO2: 33 mmol/L — ABNORMAL HIGH (ref 22–32)
Calcium: 9.4 mg/dL (ref 8.9–10.3)
Chloride: 101 mmol/L (ref 98–111)
Creatinine: 0.83 mg/dL (ref 0.44–1.00)
GFR, Est AFR Am: >60 mL/min (ref >60)
GFR, Estimated: >60 mL/min (ref >60)
Glucose, Bld: 247 mg/dL — ABNORMAL HIGH (ref 70–99)
Potassium: 4.1 mmol/L (ref 3.5–5.1)
Sodium: 141 mmol/L (ref 135–145)
Total Bilirubin: 0.3 mg/dL (ref 0.3–1.2)
Total Protein: 7 g/dL (ref 6.5–8.1)

## 2020-02-22 LAB — CBC WITH DIFFERENTIAL (CANCER CENTER ONLY)
Abs Immature Granulocytes: 0.03 10*3/uL (ref 0.00–0.07)
Basophils Absolute: 0 10*3/uL (ref 0.0–0.1)
Basophils Relative: 0 %
Eosinophils Absolute: 0.1 10*3/uL (ref 0.0–0.5)
Eosinophils Relative: 2 %
HCT: 43.6 % (ref 36.0–46.0)
Hemoglobin: 13.7 g/dL (ref 12.0–15.0)
Immature Granulocytes: 1 %
Lymphocytes Relative: 29 %
Lymphs Abs: 1.8 10*3/uL (ref 0.7–4.0)
MCH: 27.8 pg (ref 26.0–34.0)
MCHC: 31.4 g/dL (ref 30.0–36.0)
MCV: 88.6 fL (ref 80.0–100.0)
Monocytes Absolute: 0.5 10*3/uL (ref 0.1–1.0)
Monocytes Relative: 7 %
Neutro Abs: 3.8 10*3/uL (ref 1.7–7.7)
Neutrophils Relative %: 61 %
Platelet Count: 194 10*3/uL (ref 150–400)
RBC: 4.92 MIL/uL (ref 3.87–5.11)
RDW: 14.4 % (ref 11.5–15.5)
WBC Count: 6.2 10*3/uL (ref 4.0–10.5)
nRBC: 0 % (ref 0.0–0.2)

## 2020-02-22 MED ORDER — DIPHENHYDRAMINE HCL 50 MG/ML IJ SOLN
INTRAMUSCULAR | Status: AC
  Filled 2020-02-22: qty 1

## 2020-02-22 MED ORDER — TRASTUZUMAB-ANNS CHEMO 150 MG IV SOLR
750.0000 mg | Freq: Once | INTRAVENOUS | Status: AC
  Administered 2020-02-22: 750 mg via INTRAVENOUS
  Filled 2020-02-22: qty 35.72

## 2020-02-22 MED ORDER — SODIUM CHLORIDE 0.9% FLUSH
10.0000 mL | Freq: Once | INTRAVENOUS | Status: AC
  Administered 2020-02-22: 10 mL via INTRAVENOUS
  Filled 2020-02-22: qty 10

## 2020-02-22 MED ORDER — DIPHENHYDRAMINE HCL 25 MG PO CAPS
ORAL_CAPSULE | ORAL | Status: AC
  Filled 2020-02-22: qty 1

## 2020-02-22 MED ORDER — LORAZEPAM 2 MG/ML IJ SOLN
0.5000 mg | Freq: Once | INTRAMUSCULAR | Status: AC
  Administered 2020-02-22: 0.5 mg via INTRAVENOUS

## 2020-02-22 MED ORDER — HEPARIN SOD (PORK) LOCK FLUSH 100 UNIT/ML IV SOLN
500.0000 [IU] | Freq: Once | INTRAVENOUS | Status: AC | PRN
  Administered 2020-02-22: 500 [IU]
  Filled 2020-02-22: qty 5

## 2020-02-22 MED ORDER — SODIUM CHLORIDE 0.9% FLUSH
10.0000 mL | INTRAVENOUS | Status: DC | PRN
  Administered 2020-02-22: 10 mL via INTRAVENOUS
  Filled 2020-02-22: qty 10

## 2020-02-22 MED ORDER — SODIUM CHLORIDE 0.9 % IV SOLN
Freq: Once | INTRAVENOUS | Status: AC
  Filled 2020-02-22: qty 250

## 2020-02-22 MED ORDER — ACETAMINOPHEN 325 MG PO TABS
650.0000 mg | ORAL_TABLET | Freq: Once | ORAL | Status: AC
  Administered 2020-02-22: 650 mg via ORAL

## 2020-02-22 MED ORDER — LORAZEPAM 2 MG/ML IJ SOLN
INTRAMUSCULAR | Status: AC
  Filled 2020-02-22: qty 1

## 2020-02-22 MED ORDER — DIPHENHYDRAMINE HCL 25 MG PO CAPS
25.0000 mg | ORAL_CAPSULE | Freq: Once | ORAL | Status: AC
  Administered 2020-02-22: 25 mg via ORAL

## 2020-02-22 MED ORDER — ACETAMINOPHEN 325 MG PO TABS
ORAL_TABLET | ORAL | Status: AC
  Filled 2020-02-22: qty 2

## 2020-02-22 NOTE — Patient Instructions (Signed)
[facility] Discharge Instructions for Patients Receiving Chemotherapy  Today you received the following chemotherapy agents trastuzumab.  To help prevent nausea and vomiting after your treatment, we encourage you to take your nausea medication as directed.    If you develop nausea and vomiting that is not controlled by your nausea medication, call the clinic.   BELOW ARE SYMPTOMS THAT SHOULD BE REPORTED IMMEDIATELY:  *FEVER GREATER THAN 100.5 F  *CHILLS WITH OR WITHOUT FEVER  NAUSEA AND VOMITING THAT IS NOT CONTROLLED WITH YOUR NAUSEA MEDICATION  *UNUSUAL SHORTNESS OF BREATH  *UNUSUAL BRUISING OR BLEEDING  TENDERNESS IN MOUTH AND THROAT WITH OR WITHOUT PRESENCE OF ULCERS  *URINARY PROBLEMS  *BOWEL PROBLEMS  UNUSUAL RASH Items with * indicate a potential emergency and should be followed up as soon as possible.  Feel free to call the clinic should you have any questions or concerns. The clinic phone number is [phone].  Please show the CHEMO ALERT CARD at check-in to the Emergency Department and triage nurse.   

## 2020-02-29 DIAGNOSIS — I5020 Unspecified systolic (congestive) heart failure: Secondary | ICD-10-CM | POA: Diagnosis not present

## 2020-03-02 ENCOUNTER — Telehealth: Payer: Self-pay

## 2020-03-02 ENCOUNTER — Encounter (HOSPITAL_COMMUNITY): Payer: Self-pay | Admitting: Oncology

## 2020-03-02 NOTE — Telephone Encounter (Signed)
Patient calls nurse line regarding diabetic medication management. Patient reports that jardiance is causing her to have yeast infection, so she will no longer be taking medication. Patient requesting a different oral diabetic medication to substitute the jardiance.   To PCP  Talbot Grumbling, RN

## 2020-03-05 NOTE — Telephone Encounter (Signed)
Please call patient and let her know she needs to come in for an office visit to check her A1c and we can discuss medications then Thanks  [place]

## 2020-03-05 NOTE — Telephone Encounter (Signed)
Called patient to schedule an appointment.  Patient states that she can not come in right now because she has too many upcoming appointments.  She states that she will call later to set up appointment.  [name], [place]

## 2020-03-09 DIAGNOSIS — C3490 Malignant neoplasm of unspecified part of unspecified bronchus or lung: Secondary | ICD-10-CM | POA: Diagnosis not present

## 2020-03-09 DIAGNOSIS — M25569 Pain in unspecified knee: Secondary | ICD-10-CM | POA: Diagnosis not present

## 2020-03-09 DIAGNOSIS — R062 Wheezing: Secondary | ICD-10-CM | POA: Diagnosis not present

## 2020-03-09 DIAGNOSIS — C50912 Malignant neoplasm of unspecified site of left female breast: Secondary | ICD-10-CM | POA: Diagnosis not present

## 2020-03-09 DIAGNOSIS — I5020 Unspecified systolic (congestive) heart failure: Secondary | ICD-10-CM | POA: Diagnosis not present

## 2020-03-12 ENCOUNTER — Telehealth (HOSPITAL_COMMUNITY): Payer: Self-pay | Admitting: Oncology

## 2020-03-12 NOTE — Telephone Encounter (Signed)
Just an FYI. We have made several attempts to contact this patient including sending a letter to schedule or reschedule their echocardiogram. We will be removing the patient from the echo WQ.    Notes  03/02/20 Mailed letter to call to schedule /LBW  03/02/2020 called an VM is full @ 9:34/LBW  02/28/20 called and VM is full @ 3:50/LBW  02/28/20 Test Authorized per staff message/LBW  02/28/20 sent In basket for PA# to schedule in August/LBW  due in Aug send for PA# before scheduling/LBW        Thank you

## 2020-03-21 ENCOUNTER — Inpatient Hospital Stay: Payer: Medicare Other | Attending: Oncology

## 2020-03-21 ENCOUNTER — Other Ambulatory Visit: Payer: Self-pay | Admitting: Oncology

## 2020-03-21 ENCOUNTER — Other Ambulatory Visit: Payer: Self-pay

## 2020-03-21 ENCOUNTER — Inpatient Hospital Stay: Payer: Medicare Other

## 2020-03-21 ENCOUNTER — Other Ambulatory Visit: Payer: Self-pay | Admitting: *Deleted

## 2020-03-21 VITALS — BP 177/65 | HR 66 | Temp 98.4°F | Resp 18 | Wt 324.5 lb

## 2020-03-21 DIAGNOSIS — C50212 Malignant neoplasm of upper-inner quadrant of left female breast: Secondary | ICD-10-CM

## 2020-03-21 DIAGNOSIS — Z5112 Encounter for antineoplastic immunotherapy: Secondary | ICD-10-CM | POA: Diagnosis not present

## 2020-03-21 DIAGNOSIS — Z79899 Other long term (current) drug therapy: Secondary | ICD-10-CM | POA: Insufficient documentation

## 2020-03-21 DIAGNOSIS — Z794 Long term (current) use of insulin: Secondary | ICD-10-CM | POA: Diagnosis not present

## 2020-03-21 DIAGNOSIS — C7800 Secondary malignant neoplasm of unspecified lung: Secondary | ICD-10-CM | POA: Insufficient documentation

## 2020-03-21 DIAGNOSIS — C50911 Malignant neoplasm of unspecified site of right female breast: Secondary | ICD-10-CM

## 2020-03-21 DIAGNOSIS — Z86718 Personal history of other venous thrombosis and embolism: Secondary | ICD-10-CM | POA: Insufficient documentation

## 2020-03-21 DIAGNOSIS — G4739 Other sleep apnea: Secondary | ICD-10-CM

## 2020-03-21 DIAGNOSIS — I10 Essential (primary) hypertension: Secondary | ICD-10-CM | POA: Diagnosis not present

## 2020-03-21 DIAGNOSIS — Z170 Estrogen receptor positive status [ER+]: Secondary | ICD-10-CM | POA: Diagnosis not present

## 2020-03-21 DIAGNOSIS — M48061 Spinal stenosis, lumbar region without neurogenic claudication: Secondary | ICD-10-CM

## 2020-03-21 DIAGNOSIS — E119 Type 2 diabetes mellitus without complications: Secondary | ICD-10-CM | POA: Diagnosis not present

## 2020-03-21 DIAGNOSIS — Z95828 Presence of other vascular implants and grafts: Secondary | ICD-10-CM

## 2020-03-21 DIAGNOSIS — E1149 Type 2 diabetes mellitus with other diabetic neurological complication: Secondary | ICD-10-CM

## 2020-03-21 DIAGNOSIS — Z87891 Personal history of nicotine dependence: Secondary | ICD-10-CM | POA: Diagnosis not present

## 2020-03-21 LAB — CBC WITH DIFFERENTIAL (CANCER CENTER ONLY)
Abs Immature Granulocytes: 0.03 10*3/uL (ref 0.00–0.07)
Basophils Absolute: 0 10*3/uL (ref 0.0–0.1)
Basophils Relative: 1 %
Eosinophils Absolute: 0.1 10*3/uL (ref 0.0–0.5)
Eosinophils Relative: 2 %
HCT: 44.2 % (ref 36.0–46.0)
Hemoglobin: 13.9 g/dL (ref 12.0–15.0)
Immature Granulocytes: 1 %
Lymphocytes Relative: 29 %
Lymphs Abs: 1.7 10*3/uL (ref 0.7–4.0)
MCH: 27.4 pg (ref 26.0–34.0)
MCHC: 31.4 g/dL (ref 30.0–36.0)
MCV: 87 fL (ref 80.0–100.0)
Monocytes Absolute: 0.4 10*3/uL (ref 0.1–1.0)
Monocytes Relative: 7 %
Neutro Abs: 3.5 10*3/uL (ref 1.7–7.7)
Neutrophils Relative %: 60 %
Platelet Count: 203 10*3/uL (ref 150–400)
RBC: 5.08 MIL/uL (ref 3.87–5.11)
RDW: 14.8 % (ref 11.5–15.5)
WBC Count: 5.7 10*3/uL (ref 4.0–10.5)
nRBC: 0 % (ref 0.0–0.2)

## 2020-03-21 LAB — CMP (CANCER CENTER ONLY)
ALT: 51 U/L — ABNORMAL HIGH (ref 0–44)
AST: 29 U/L (ref 15–41)
Albumin: 3 g/dL — ABNORMAL LOW (ref 3.5–5.0)
Alkaline Phosphatase: 111 U/L (ref 38–126)
Anion gap: 8 (ref 5–15)
BUN: 15 mg/dL (ref 8–23)
CO2: 31 mmol/L (ref 22–32)
Calcium: 9.6 mg/dL (ref 8.9–10.3)
Chloride: 100 mmol/L (ref 98–111)
Creatinine: 0.98 mg/dL (ref 0.44–1.00)
GFR, Est AFR Am: >60 mL/min (ref >60)
GFR, Estimated: 58 mL/min — ABNORMAL LOW (ref >60)
Glucose, Bld: 364 mg/dL — ABNORMAL HIGH (ref 70–99)
Potassium: 4.5 mmol/L (ref 3.5–5.1)
Sodium: 139 mmol/L (ref 135–145)
Total Bilirubin: 0.4 mg/dL (ref 0.3–1.2)
Total Protein: 7 g/dL (ref 6.5–8.1)

## 2020-03-21 MED ORDER — TRASTUZUMAB-ANNS CHEMO 150 MG IV SOLR
750.0000 mg | Freq: Once | INTRAVENOUS | Status: AC
  Administered 2020-03-21: 750 mg via INTRAVENOUS
  Filled 2020-03-21: qty 35.72

## 2020-03-21 MED ORDER — SODIUM CHLORIDE 0.9% FLUSH
10.0000 mL | Freq: Once | INTRAVENOUS | Status: AC
  Administered 2020-03-21: 10 mL
  Filled 2020-03-21: qty 10

## 2020-03-21 MED ORDER — LORAZEPAM 2 MG/ML IJ SOLN
0.5000 mg | Freq: Once | INTRAMUSCULAR | Status: AC
  Administered 2020-03-21: 0.5 mg via INTRAVENOUS

## 2020-03-21 MED ORDER — HEPARIN SOD (PORK) LOCK FLUSH 100 UNIT/ML IV SOLN
500.0000 [IU] | Freq: Once | INTRAVENOUS | Status: AC | PRN
  Administered 2020-03-21: 500 [IU]
  Filled 2020-03-21: qty 5

## 2020-03-21 MED ORDER — LORAZEPAM 2 MG/ML IJ SOLN
INTRAMUSCULAR | Status: AC
  Filled 2020-03-21: qty 1

## 2020-03-21 MED ORDER — ACETAMINOPHEN 325 MG PO TABS
ORAL_TABLET | ORAL | Status: AC
  Filled 2020-03-21: qty 2

## 2020-03-21 MED ORDER — DIPHENHYDRAMINE HCL 25 MG PO CAPS
25.0000 mg | ORAL_CAPSULE | Freq: Once | ORAL | Status: AC
  Administered 2020-03-21: 25 mg via ORAL

## 2020-03-21 MED ORDER — SODIUM CHLORIDE 0.9% FLUSH
10.0000 mL | Freq: Once | INTRAVENOUS | Status: AC
  Administered 2020-03-21: 10 mL via INTRAVENOUS
  Filled 2020-03-21: qty 10

## 2020-03-21 MED ORDER — SODIUM CHLORIDE 0.9 % IV SOLN
Freq: Once | INTRAVENOUS | Status: AC
  Filled 2020-03-21: qty 250

## 2020-03-21 MED ORDER — ACETAMINOPHEN 325 MG PO TABS
650.0000 mg | ORAL_TABLET | Freq: Once | ORAL | Status: AC
  Administered 2020-03-21: 650 mg via ORAL

## 2020-03-21 MED ORDER — DIPHENHYDRAMINE HCL 25 MG PO CAPS
ORAL_CAPSULE | ORAL | Status: AC
  Filled 2020-03-21: qty 1

## 2020-03-21 NOTE — Patient Instructions (Signed)
[facility] Discharge Instructions for Patients Receiving Chemotherapy  Today you received the following chemotherapy agents trastuzumab.  To help prevent nausea and vomiting after your treatment, we encourage you to take your nausea medication as directed.    If you develop nausea and vomiting that is not controlled by your nausea medication, call the clinic.   BELOW ARE SYMPTOMS THAT SHOULD BE REPORTED IMMEDIATELY:  *FEVER GREATER THAN 100.5 F  *CHILLS WITH OR WITHOUT FEVER  NAUSEA AND VOMITING THAT IS NOT CONTROLLED WITH YOUR NAUSEA MEDICATION  *UNUSUAL SHORTNESS OF BREATH  *UNUSUAL BRUISING OR BLEEDING  TENDERNESS IN MOUTH AND THROAT WITH OR WITHOUT PRESENCE OF ULCERS  *URINARY PROBLEMS  *BOWEL PROBLEMS  UNUSUAL RASH Items with * indicate a potential emergency and should be followed up as soon as possible.  Feel free to call the clinic should you have any questions or concerns. The clinic phone number is [phone].  Please show the CHEMO ALERT CARD at check-in to the Emergency Department and triage nurse.   

## 2020-03-21 NOTE — Patient Instructions (Signed)

## 2020-03-22 DIAGNOSIS — R062 Wheezing: Secondary | ICD-10-CM | POA: Diagnosis not present

## 2020-03-22 DIAGNOSIS — I5020 Unspecified systolic (congestive) heart failure: Secondary | ICD-10-CM | POA: Diagnosis not present

## 2020-03-22 DIAGNOSIS — C50912 Malignant neoplasm of unspecified site of left female breast: Secondary | ICD-10-CM | POA: Diagnosis not present

## 2020-03-22 DIAGNOSIS — C3490 Malignant neoplasm of unspecified part of unspecified bronchus or lung: Secondary | ICD-10-CM | POA: Diagnosis not present

## 2020-03-22 DIAGNOSIS — M25569 Pain in unspecified knee: Secondary | ICD-10-CM | POA: Diagnosis not present

## 2020-03-27 ENCOUNTER — Other Ambulatory Visit: Payer: Self-pay

## 2020-03-27 ENCOUNTER — Ambulatory Visit (INDEPENDENT_AMBULATORY_CARE_PROVIDER_SITE_OTHER): Payer: Medicare Other | Admitting: Family Medicine

## 2020-03-27 DIAGNOSIS — E1149 Type 2 diabetes mellitus with other diabetic neurological complication: Secondary | ICD-10-CM

## 2020-03-28 NOTE — Progress Notes (Signed)
Patient left before being seen.

## 2020-03-30 ENCOUNTER — Other Ambulatory Visit (HOSPITAL_COMMUNITY): Payer: Medicare Other

## 2020-03-31 DIAGNOSIS — I5020 Unspecified systolic (congestive) heart failure: Secondary | ICD-10-CM | POA: Diagnosis not present

## 2020-04-09 DIAGNOSIS — C3490 Malignant neoplasm of unspecified part of unspecified bronchus or lung: Secondary | ICD-10-CM | POA: Diagnosis not present

## 2020-04-09 DIAGNOSIS — I5020 Unspecified systolic (congestive) heart failure: Secondary | ICD-10-CM | POA: Diagnosis not present

## 2020-04-09 DIAGNOSIS — M25569 Pain in unspecified knee: Secondary | ICD-10-CM | POA: Diagnosis not present

## 2020-04-09 DIAGNOSIS — R062 Wheezing: Secondary | ICD-10-CM | POA: Diagnosis not present

## 2020-04-09 DIAGNOSIS — C50912 Malignant neoplasm of unspecified site of left female breast: Secondary | ICD-10-CM | POA: Diagnosis not present

## 2020-04-10 ENCOUNTER — Other Ambulatory Visit: Payer: Self-pay

## 2020-04-10 ENCOUNTER — Ambulatory Visit (HOSPITAL_COMMUNITY)
Admission: RE | Admit: 2020-04-10 | Discharge: 2020-04-10 | Disposition: A | Discharge status: Home / Self Care | Payer: Medicare Other | Source: Ambulatory Visit | Attending: Oncology | Admitting: Oncology

## 2020-04-10 DIAGNOSIS — G4739 Other sleep apnea: Secondary | ICD-10-CM

## 2020-04-10 DIAGNOSIS — Z87891 Personal history of nicotine dependence: Secondary | ICD-10-CM | POA: Diagnosis not present

## 2020-04-10 DIAGNOSIS — E1149 Type 2 diabetes mellitus with other diabetic neurological complication: Secondary | ICD-10-CM

## 2020-04-10 DIAGNOSIS — M48061 Spinal stenosis, lumbar region without neurogenic claudication: Secondary | ICD-10-CM | POA: Diagnosis not present

## 2020-04-10 DIAGNOSIS — C50911 Malignant neoplasm of unspecified site of right female breast: Secondary | ICD-10-CM | POA: Diagnosis not present

## 2020-04-10 DIAGNOSIS — C7800 Secondary malignant neoplasm of unspecified lung: Secondary | ICD-10-CM | POA: Diagnosis not present

## 2020-04-10 DIAGNOSIS — Z170 Estrogen receptor positive status [ER+]: Secondary | ICD-10-CM | POA: Insufficient documentation

## 2020-04-10 DIAGNOSIS — C50212 Malignant neoplasm of upper-inner quadrant of left female breast: Secondary | ICD-10-CM | POA: Insufficient documentation

## 2020-04-10 DIAGNOSIS — I10 Essential (primary) hypertension: Secondary | ICD-10-CM

## 2020-04-10 LAB — ECHOCARDIOGRAM COMPLETE
Area-P 1/2: 3.53 cm2
S' Lateral: 3.3 cm
Single Plane A4C EF: 54.3 %

## 2020-04-10 NOTE — Progress Notes (Signed)
  Echocardiogram 2D Echocardiogram has been performed.  Geoffery Lyons Swaim 04/10/2020, 10:59 AM

## 2020-04-18 ENCOUNTER — Inpatient Hospital Stay: Payer: Medicare Other

## 2020-04-18 ENCOUNTER — Inpatient Hospital Stay: Payer: Medicare Other | Attending: Oncology

## 2020-04-18 ENCOUNTER — Other Ambulatory Visit: Payer: Self-pay

## 2020-04-18 VITALS — BP 178/51 | HR 54 | Temp 97.9°F | Resp 20 | Wt 326.5 lb

## 2020-04-18 DIAGNOSIS — C50212 Malignant neoplasm of upper-inner quadrant of left female breast: Secondary | ICD-10-CM

## 2020-04-18 DIAGNOSIS — I10 Essential (primary) hypertension: Secondary | ICD-10-CM | POA: Diagnosis not present

## 2020-04-18 DIAGNOSIS — Z170 Estrogen receptor positive status [ER+]: Secondary | ICD-10-CM

## 2020-04-18 DIAGNOSIS — Z79899 Other long term (current) drug therapy: Secondary | ICD-10-CM | POA: Diagnosis not present

## 2020-04-18 DIAGNOSIS — Z86718 Personal history of other venous thrombosis and embolism: Secondary | ICD-10-CM | POA: Insufficient documentation

## 2020-04-18 DIAGNOSIS — Z79811 Long term (current) use of aromatase inhibitors: Secondary | ICD-10-CM | POA: Diagnosis not present

## 2020-04-18 DIAGNOSIS — Z5112 Encounter for antineoplastic immunotherapy: Secondary | ICD-10-CM | POA: Diagnosis not present

## 2020-04-18 DIAGNOSIS — Z794 Long term (current) use of insulin: Secondary | ICD-10-CM | POA: Insufficient documentation

## 2020-04-18 DIAGNOSIS — Z87891 Personal history of nicotine dependence: Secondary | ICD-10-CM | POA: Insufficient documentation

## 2020-04-18 DIAGNOSIS — Z95828 Presence of other vascular implants and grafts: Secondary | ICD-10-CM

## 2020-04-18 DIAGNOSIS — C7800 Secondary malignant neoplasm of unspecified lung: Secondary | ICD-10-CM | POA: Diagnosis not present

## 2020-04-18 LAB — CBC WITH DIFFERENTIAL (CANCER CENTER ONLY)
Abs Immature Granulocytes: 0.02 10*3/uL (ref 0.00–0.07)
Basophils Absolute: 0 10*3/uL (ref 0.0–0.1)
Basophils Relative: 0 %
Eosinophils Absolute: 0.1 10*3/uL (ref 0.0–0.5)
Eosinophils Relative: 3 %
HCT: 42.9 % (ref 36.0–46.0)
Hemoglobin: 13.4 g/dL (ref 12.0–15.0)
Immature Granulocytes: 0 %
Lymphocytes Relative: 29 %
Lymphs Abs: 1.6 10*3/uL (ref 0.7–4.0)
MCH: 27.2 pg (ref 26.0–34.0)
MCHC: 31.2 g/dL (ref 30.0–36.0)
MCV: 87 fL (ref 80.0–100.0)
Monocytes Absolute: 0.4 10*3/uL (ref 0.1–1.0)
Monocytes Relative: 7 %
Neutro Abs: 3.4 10*3/uL (ref 1.7–7.7)
Neutrophils Relative %: 61 %
Platelet Count: 212 10*3/uL (ref 150–400)
RBC: 4.93 MIL/uL (ref 3.87–5.11)
RDW: 14.9 % (ref 11.5–15.5)
WBC Count: 5.7 10*3/uL (ref 4.0–10.5)
nRBC: 0 % (ref 0.0–0.2)

## 2020-04-18 LAB — CMP (CANCER CENTER ONLY)
ALT: 47 U/L — ABNORMAL HIGH (ref 0–44)
AST: 35 U/L (ref 15–41)
Albumin: 3 g/dL — ABNORMAL LOW (ref 3.5–5.0)
Alkaline Phosphatase: 108 U/L (ref 38–126)
Anion gap: 6 (ref 5–15)
BUN: 12 mg/dL (ref 8–23)
CO2: 31 mmol/L (ref 22–32)
Calcium: 9.7 mg/dL (ref 8.9–10.3)
Chloride: 102 mmol/L (ref 98–111)
Creatinine: 0.77 mg/dL (ref 0.44–1.00)
GFR, Est AFR Am: >60 mL/min (ref >60)
GFR, Estimated: >60 mL/min (ref >60)
Glucose, Bld: 271 mg/dL — ABNORMAL HIGH (ref 70–99)
Potassium: 4.2 mmol/L (ref 3.5–5.1)
Sodium: 139 mmol/L (ref 135–145)
Total Bilirubin: 0.4 mg/dL (ref 0.3–1.2)
Total Protein: 7 g/dL (ref 6.5–8.1)

## 2020-04-18 MED ORDER — SODIUM CHLORIDE 0.9% FLUSH
10.0000 mL | Freq: Once | INTRAVENOUS | Status: AC
  Administered 2020-04-18: 10 mL
  Filled 2020-04-18: qty 10

## 2020-04-18 MED ORDER — SODIUM CHLORIDE 0.9 % IV SOLN
Freq: Once | INTRAVENOUS | Status: AC
  Filled 2020-04-18: qty 250

## 2020-04-18 MED ORDER — DIPHENHYDRAMINE HCL 25 MG PO CAPS
25.0000 mg | ORAL_CAPSULE | Freq: Once | ORAL | Status: AC
  Administered 2020-04-18: 25 mg via ORAL

## 2020-04-18 MED ORDER — ACETAMINOPHEN 325 MG PO TABS
ORAL_TABLET | ORAL | Status: AC
  Filled 2020-04-18: qty 2

## 2020-04-18 MED ORDER — ACETAMINOPHEN 325 MG PO TABS
650.0000 mg | ORAL_TABLET | Freq: Once | ORAL | Status: AC
  Administered 2020-04-18: 650 mg via ORAL

## 2020-04-18 MED ORDER — SODIUM CHLORIDE 0.9% FLUSH
10.0000 mL | Freq: Once | INTRAVENOUS | Status: AC
  Administered 2020-04-18: 10 mL via INTRAVENOUS
  Filled 2020-04-18: qty 10

## 2020-04-18 MED ORDER — DIPHENHYDRAMINE HCL 25 MG PO CAPS
ORAL_CAPSULE | ORAL | Status: AC
  Filled 2020-04-18: qty 1

## 2020-04-18 MED ORDER — LORAZEPAM 2 MG/ML IJ SOLN
0.5000 mg | Freq: Once | INTRAMUSCULAR | Status: AC
  Administered 2020-04-18: 0.5 mg via INTRAVENOUS

## 2020-04-18 MED ORDER — LORAZEPAM 2 MG/ML IJ SOLN
INTRAMUSCULAR | Status: AC
  Filled 2020-04-18: qty 1

## 2020-04-18 MED ORDER — HEPARIN SOD (PORK) LOCK FLUSH 100 UNIT/ML IV SOLN
500.0000 [IU] | Freq: Once | INTRAVENOUS | Status: AC | PRN
  Administered 2020-04-18: 500 [IU]
  Filled 2020-04-18: qty 5

## 2020-04-18 MED ORDER — TRASTUZUMAB-ANNS CHEMO 150 MG IV SOLR
750.0000 mg | Freq: Once | INTRAVENOUS | Status: AC
  Administered 2020-04-18: 750 mg via INTRAVENOUS
  Filled 2020-04-18: qty 35.72

## 2020-04-18 NOTE — Patient Instructions (Signed)
[facility] Discharge Instructions for Patients Receiving Chemotherapy  Today you received the following chemotherapy agents trastuzumab.  To help prevent nausea and vomiting after your treatment, we encourage you to take your nausea medication as directed.    If you develop nausea and vomiting that is not controlled by your nausea medication, call the clinic.   BELOW ARE SYMPTOMS THAT SHOULD BE REPORTED IMMEDIATELY:  *FEVER GREATER THAN 100.5 F  *CHILLS WITH OR WITHOUT FEVER  NAUSEA AND VOMITING THAT IS NOT CONTROLLED WITH YOUR NAUSEA MEDICATION  *UNUSUAL SHORTNESS OF BREATH  *UNUSUAL BRUISING OR BLEEDING  TENDERNESS IN MOUTH AND THROAT WITH OR WITHOUT PRESENCE OF ULCERS  *URINARY PROBLEMS  *BOWEL PROBLEMS  UNUSUAL RASH Items with * indicate a potential emergency and should be followed up as soon as possible.  Feel free to call the clinic should you have any questions or concerns. The clinic phone number is [phone].  Please show the CHEMO ALERT CARD at check-in to the Emergency Department and triage nurse.   

## 2020-04-22 ENCOUNTER — Other Ambulatory Visit: Payer: Self-pay | Admitting: Oncology

## 2020-04-22 ENCOUNTER — Other Ambulatory Visit: Payer: Self-pay | Admitting: Family Medicine

## 2020-04-22 DIAGNOSIS — R062 Wheezing: Secondary | ICD-10-CM | POA: Diagnosis not present

## 2020-04-22 DIAGNOSIS — C3490 Malignant neoplasm of unspecified part of unspecified bronchus or lung: Secondary | ICD-10-CM | POA: Diagnosis not present

## 2020-04-22 DIAGNOSIS — I5020 Unspecified systolic (congestive) heart failure: Secondary | ICD-10-CM | POA: Diagnosis not present

## 2020-04-22 DIAGNOSIS — M25569 Pain in unspecified knee: Secondary | ICD-10-CM | POA: Diagnosis not present

## 2020-04-22 DIAGNOSIS — C50912 Malignant neoplasm of unspecified site of left female breast: Secondary | ICD-10-CM | POA: Diagnosis not present

## 2020-04-30 ENCOUNTER — Other Ambulatory Visit: Payer: Self-pay

## 2020-04-30 ENCOUNTER — Telehealth: Payer: Self-pay

## 2020-04-30 ENCOUNTER — Emergency Department (HOSPITAL_COMMUNITY): Payer: Medicare Other

## 2020-04-30 ENCOUNTER — Encounter (HOSPITAL_COMMUNITY): Payer: Self-pay

## 2020-04-30 ENCOUNTER — Emergency Department (HOSPITAL_COMMUNITY)
Admission: EM | Admit: 2020-04-30 | Discharge: 2020-04-30 | Disposition: A | Discharge status: Home / Self Care | Payer: Medicare Other | Source: Home / Self Care | Attending: Emergency Medicine | Admitting: Emergency Medicine

## 2020-04-30 DIAGNOSIS — Z79891 Long term (current) use of opiate analgesic: Secondary | ICD-10-CM | POA: Insufficient documentation

## 2020-04-30 DIAGNOSIS — M25569 Pain in unspecified knee: Secondary | ICD-10-CM | POA: Diagnosis not present

## 2020-04-30 DIAGNOSIS — E1149 Type 2 diabetes mellitus with other diabetic neurological complication: Secondary | ICD-10-CM | POA: Insufficient documentation

## 2020-04-30 DIAGNOSIS — Z170 Estrogen receptor positive status [ER+]: Secondary | ICD-10-CM | POA: Diagnosis not present

## 2020-04-30 DIAGNOSIS — Z833 Family history of diabetes mellitus: Secondary | ICD-10-CM | POA: Diagnosis not present

## 2020-04-30 DIAGNOSIS — R69 Illness, unspecified: Secondary | ICD-10-CM

## 2020-04-30 DIAGNOSIS — Z79899 Other long term (current) drug therapy: Secondary | ICD-10-CM | POA: Insufficient documentation

## 2020-04-30 DIAGNOSIS — E119 Type 2 diabetes mellitus without complications: Secondary | ICD-10-CM | POA: Insufficient documentation

## 2020-04-30 DIAGNOSIS — Z87891 Personal history of nicotine dependence: Secondary | ICD-10-CM | POA: Diagnosis not present

## 2020-04-30 DIAGNOSIS — R062 Wheezing: Secondary | ICD-10-CM | POA: Diagnosis not present

## 2020-04-30 DIAGNOSIS — M549 Dorsalgia, unspecified: Secondary | ICD-10-CM | POA: Diagnosis not present

## 2020-04-30 DIAGNOSIS — J440 Chronic obstructive pulmonary disease with acute lower respiratory infection: Secondary | ICD-10-CM | POA: Diagnosis not present

## 2020-04-30 DIAGNOSIS — R0602 Shortness of breath: Secondary | ICD-10-CM | POA: Insufficient documentation

## 2020-04-30 DIAGNOSIS — R911 Solitary pulmonary nodule: Secondary | ICD-10-CM | POA: Diagnosis not present

## 2020-04-30 DIAGNOSIS — Z7982 Long term (current) use of aspirin: Secondary | ICD-10-CM | POA: Insufficient documentation

## 2020-04-30 DIAGNOSIS — C7800 Secondary malignant neoplasm of unspecified lung: Secondary | ICD-10-CM | POA: Diagnosis not present

## 2020-04-30 DIAGNOSIS — R55 Syncope and collapse: Secondary | ICD-10-CM | POA: Diagnosis not present

## 2020-04-30 DIAGNOSIS — J441 Chronic obstructive pulmonary disease with (acute) exacerbation: Secondary | ICD-10-CM | POA: Diagnosis not present

## 2020-04-30 DIAGNOSIS — K1120 Sialoadenitis, unspecified: Secondary | ICD-10-CM | POA: Diagnosis not present

## 2020-04-30 DIAGNOSIS — A0839 Other viral enteritis: Secondary | ICD-10-CM | POA: Diagnosis not present

## 2020-04-30 DIAGNOSIS — J449 Chronic obstructive pulmonary disease, unspecified: Secondary | ICD-10-CM | POA: Insufficient documentation

## 2020-04-30 DIAGNOSIS — C3490 Malignant neoplasm of unspecified part of unspecified bronchus or lung: Secondary | ICD-10-CM | POA: Diagnosis not present

## 2020-04-30 DIAGNOSIS — J9601 Acute respiratory failure with hypoxia: Secondary | ICD-10-CM | POA: Diagnosis not present

## 2020-04-30 DIAGNOSIS — Z794 Long term (current) use of insulin: Secondary | ICD-10-CM | POA: Insufficient documentation

## 2020-04-30 DIAGNOSIS — Z8249 Family history of ischemic heart disease and other diseases of the circulatory system: Secondary | ICD-10-CM | POA: Diagnosis not present

## 2020-04-30 DIAGNOSIS — K760 Fatty (change of) liver, not elsewhere classified: Secondary | ICD-10-CM | POA: Diagnosis not present

## 2020-04-30 DIAGNOSIS — I517 Cardiomegaly: Secondary | ICD-10-CM | POA: Diagnosis not present

## 2020-04-30 DIAGNOSIS — E1165 Type 2 diabetes mellitus with hyperglycemia: Secondary | ICD-10-CM | POA: Diagnosis not present

## 2020-04-30 DIAGNOSIS — I10 Essential (primary) hypertension: Secondary | ICD-10-CM | POA: Insufficient documentation

## 2020-04-30 DIAGNOSIS — G4730 Sleep apnea, unspecified: Secondary | ICD-10-CM | POA: Diagnosis not present

## 2020-04-30 DIAGNOSIS — Z85118 Personal history of other malignant neoplasm of bronchus and lung: Secondary | ICD-10-CM | POA: Insufficient documentation

## 2020-04-30 DIAGNOSIS — M479 Spondylosis, unspecified: Secondary | ICD-10-CM | POA: Diagnosis not present

## 2020-04-30 DIAGNOSIS — C50212 Malignant neoplasm of upper-inner quadrant of left female breast: Secondary | ICD-10-CM | POA: Diagnosis not present

## 2020-04-30 DIAGNOSIS — U071 COVID-19: Secondary | ICD-10-CM

## 2020-04-30 DIAGNOSIS — J1282 Pneumonia due to coronavirus disease 2019: Secondary | ICD-10-CM | POA: Diagnosis not present

## 2020-04-30 DIAGNOSIS — R0902 Hypoxemia: Secondary | ICD-10-CM | POA: Diagnosis not present

## 2020-04-30 DIAGNOSIS — M1990 Unspecified osteoarthritis, unspecified site: Secondary | ICD-10-CM | POA: Diagnosis not present

## 2020-04-30 DIAGNOSIS — G8929 Other chronic pain: Secondary | ICD-10-CM | POA: Diagnosis not present

## 2020-04-30 DIAGNOSIS — I5020 Unspecified systolic (congestive) heart failure: Secondary | ICD-10-CM | POA: Diagnosis not present

## 2020-04-30 DIAGNOSIS — J9811 Atelectasis: Secondary | ICD-10-CM | POA: Diagnosis not present

## 2020-04-30 DIAGNOSIS — R918 Other nonspecific abnormal finding of lung field: Secondary | ICD-10-CM | POA: Diagnosis not present

## 2020-04-30 DIAGNOSIS — C50912 Malignant neoplasm of unspecified site of left female breast: Secondary | ICD-10-CM | POA: Diagnosis not present

## 2020-04-30 DIAGNOSIS — Z803 Family history of malignant neoplasm of breast: Secondary | ICD-10-CM | POA: Diagnosis not present

## 2020-04-30 HISTORY — DX: Chronic obstructive pulmonary disease, unspecified: J44.9

## 2020-04-30 LAB — TROPONIN I (HIGH SENSITIVITY): Troponin I (High Sensitivity): 10 ng/L (ref <18)

## 2020-04-30 LAB — CBC WITH DIFFERENTIAL/PLATELET
Abs Immature Granulocytes: 0.01 10*3/uL (ref 0.00–0.07)
Basophils Absolute: 0 10*3/uL (ref 0.0–0.1)
Basophils Relative: 0 %
Eosinophils Absolute: 0 10*3/uL (ref 0.0–0.5)
Eosinophils Relative: 0 %
HCT: 47.9 % — ABNORMAL HIGH (ref 36.0–46.0)
Hemoglobin: 14.7 g/dL (ref 12.0–15.0)
Immature Granulocytes: 0 %
Lymphocytes Relative: 28 %
Lymphs Abs: 0.9 10*3/uL (ref 0.7–4.0)
MCH: 27.7 pg (ref 26.0–34.0)
MCHC: 30.7 g/dL (ref 30.0–36.0)
MCV: 90.2 fL (ref 80.0–100.0)
Monocytes Absolute: 0.2 10*3/uL (ref 0.1–1.0)
Monocytes Relative: 8 %
Neutro Abs: 2 10*3/uL (ref 1.7–7.7)
Neutrophils Relative %: 64 %
Platelets: 164 10*3/uL (ref 150–400)
RBC: 5.31 MIL/uL — ABNORMAL HIGH (ref 3.87–5.11)
RDW: 14.6 % (ref 11.5–15.5)
WBC: 3.1 10*3/uL — ABNORMAL LOW (ref 4.0–10.5)
nRBC: 0 % (ref 0.0–0.2)

## 2020-04-30 LAB — COMPREHENSIVE METABOLIC PANEL
ALT: 56 U/L — ABNORMAL HIGH (ref 0–44)
AST: 54 U/L — ABNORMAL HIGH (ref 15–41)
Albumin: 3.1 g/dL — ABNORMAL LOW (ref 3.5–5.0)
Alkaline Phosphatase: 123 U/L (ref 38–126)
Anion gap: 11 (ref 5–15)
BUN: 14 mg/dL (ref 8–23)
CO2: 34 mmol/L — ABNORMAL HIGH (ref 22–32)
Calcium: 8.9 mg/dL (ref 8.9–10.3)
Chloride: 94 mmol/L — ABNORMAL LOW (ref 98–111)
Creatinine, Ser: 0.65 mg/dL (ref 0.44–1.00)
GFR calc Af Amer: >60 mL/min (ref >60)
GFR calc non Af Amer: >60 mL/min (ref >60)
Glucose, Bld: 246 mg/dL — ABNORMAL HIGH (ref 70–99)
Potassium: 4.3 mmol/L (ref 3.5–5.1)
Sodium: 139 mmol/L (ref 135–145)
Total Bilirubin: 0.3 mg/dL (ref 0.3–1.2)
Total Protein: 7.5 g/dL (ref 6.5–8.1)

## 2020-04-30 LAB — FIBRINOGEN: Fibrinogen: 702 mg/dL — ABNORMAL HIGH (ref 210–475)

## 2020-04-30 LAB — SARS CORONAVIRUS 2 BY RT PCR (HOSPITAL ORDER, PERFORMED IN ~~LOC~~ HOSPITAL LAB): SARS Coronavirus 2: POSITIVE — AB

## 2020-04-30 LAB — C-REACTIVE PROTEIN: CRP: 15.9 mg/dL — ABNORMAL HIGH (ref <1.0)

## 2020-04-30 LAB — D-DIMER, QUANTITATIVE: D-Dimer, Quant: 1 ug/mL-FEU — ABNORMAL HIGH (ref 0.00–0.50)

## 2020-04-30 LAB — CBG MONITORING, ED: Glucose-Capillary: 262 mg/dL — ABNORMAL HIGH (ref 70–99)

## 2020-04-30 LAB — PROCALCITONIN: Procalcitonin: <0.1 ng/mL

## 2020-04-30 LAB — BRAIN NATRIURETIC PEPTIDE: B Natriuretic Peptide: 19.8 pg/mL (ref 0.0–100.0)

## 2020-04-30 LAB — TRIGLYCERIDES: Triglycerides: 107 mg/dL (ref <150)

## 2020-04-30 LAB — FERRITIN: Ferritin: 154 ng/mL (ref 11–307)

## 2020-04-30 LAB — LACTIC ACID, PLASMA: Lactic Acid, Venous: 1.2 mmol/L (ref 0.5–1.9)

## 2020-04-30 LAB — LACTATE DEHYDROGENASE: LDH: 183 U/L (ref 98–192)

## 2020-04-30 MED ORDER — PREDNISONE 20 MG PO TABS: ORAL_TABLET | ORAL | 0 refills | Status: DC

## 2020-04-30 MED ORDER — PREDNISONE 20 MG PO TABS
40.0000 mg | ORAL_TABLET | Freq: Once | ORAL | Status: AC
  Administered 2020-04-30: 40 mg via ORAL
  Filled 2020-04-30: qty 2

## 2020-04-30 MED ORDER — BENZONATATE 100 MG PO CAPS: 100.0000 mg | ORAL_CAPSULE | Freq: Three times a day (TID) | ORAL | 0 refills | Status: DC | PRN

## 2020-04-30 MED ORDER — ONDANSETRON 4 MG PO TBDP
4.0000 mg | ORAL_TABLET | Freq: Once | ORAL | Status: AC
  Administered 2020-04-30: 4 mg via ORAL
  Filled 2020-04-30: qty 1

## 2020-04-30 NOTE — Telephone Encounter (Signed)
Patient calls nurse line regarding shortness of breath. Patient reports that she has the flu. Asked patient if she has been tested, patient states that she has not, but she knows that she has the flu based on her symptoms.   Patient reports resting SpO2 at 90% and desaturations with exertion down to the mid to low 80s. Patient sounds short of breath during conversation. Advised patient to proceed to ED for further evaluation.   To PCP  Talbot Grumbling, RN

## 2020-04-30 NOTE — ED Provider Notes (Signed)
[facility] DEPT Provider Note   CSN: [id number] Arrival date & time: 04/30/20  1133     History Chief Complaint  Patient presents with  . Shortness of Breath    [name] is a 69 y.o. female.  HPI Patient reports she started getting symptoms 1 week ago.  Symptoms started with fevers and chills.  Temperature was as high as 102.  She was controlling fever with ibuprofen at home.  2 days ago she started to get very short of breath.  Reports she is having a lot of coughing.  Cough is essentially nonproductive.  She did get some relief with an albuterol nebulizer.  This improves her shortness of breath.  She reports however she got very short of breath when she had to temporarily take her oxygen off to go to the bathroom.  She reports her tubing is not long enough to reach the bathroom to use restroom and shower.  Typically she is able to do this and then get back on oxygen with her oxygen saturations going down to the mid 80s.  She reports today it went down to 70s and she was extremely short of breath and her daughter noted her to be blue around the lips.  For 2 days now she has also had diarrhea.  No abdominal pain.  No vomiting.  She reports she always gets swelling in her legs.  Not different from baseline.  She reports her legs always hurt if you put any pressure on them.  All week she has had some chest pressure sensation.  No sharp pain or localizing pain.  Patient reports that all the family members at home have been sick with fevers and chills.  It's been going through the house starting with her youngest grandson at 76 years of age.  He improved after 2 days.  Her son then who is in his 11s became ill and stayed in his room, to protect her because he suspected he had Covid.  She reports her older daughter also became ill.  Patient has not been vaccinated for Covid.  She reports she was afraid to get the vaccine due to her multiple comorbid illnesses: COPD,  history of breast cancer and diabetes.    Past Medical History:  Diagnosis Date  . Arthritis   . Back pain   . Breast cancer ([place]) dx'd 11/2012   left  . Chest pain   . COPD (chronic obstructive pulmonary disease) ([place])   . Diabetes mellitus without complication ([place]) 11/24/8117  . Ear pain   . Fatty liver 6/03  . Hypertension   . Lung disease   . Lung metastases ([place]) dx'd 11/2012  . Obesity, unspecified   . Other abnormal glucose   . Suicide attempt ([place]) 1996  . Syncope and collapse   . Unspecified sleep apnea     Patient Active Problem List   Diagnosis Date Noted  . Port-A-Cath in place 03/21/2020  . Goals of care, counseling/discussion 08/05/2017  . Obesity, morbid, BMI 50 or higher ([place]) 10/02/2015  . Diabetes mellitus type 2 with neurological manifestations ([place]) 09/28/2014  . Malignant neoplasm of upper-inner quadrant of left breast in female, estrogen receptor positive ([place]) 05/23/2013  . 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 ([place]) 12/14/2012  . Anxiety state 10/15/2006  . HYPERTENSION, BENIGN SYSTEMIC 10/15/2006  . Osteoarthritis of spine 10/15/2006  . LUMBAR SPINAL STENOSIS 10/15/2006  . Sleep apnea 10/15/2006    Past Surgical History:  Procedure Laterality Date  .  CARDIAC CATHETERIZATION     2007  . CHOLECYSTECTOMY    . TUBAL LIGATION       OB History   No obstetric history on file.     Family History  Problem Relation Age of Onset  . Coronary artery disease Father 65  . Diabetes Father   . Heart disease Father   . Breast cancer Mother 82  . Cancer Mother 25       breast  . Aplastic anemia Daughter        died at age 40  . Cancer Maternal Aunt 40       ovarian  . Cancer Maternal Grandmother 55       ovarian  . Cancer Paternal Aunt 55       ovarian/breast/breast  . Coronary artery disease Sister 71  . Coronary artery disease Brother 66    Social History   Tobacco Use  . Smoking status: Former Smoker    Packs/day: 3.00    Years: 5.00     Pack years: 15.00    Types: Cigarettes    Quit date: 08/18/1968    Years since quitting: 51.7  . Smokeless tobacco: Never Used  Vaping Use  . Vaping Use: Never used  Substance Use Topics  . Alcohol use: No    Alcohol/week: 0.0 standard drinks  . Drug use: No    Home Medications Prior to Admission medications   Medication Sig Start Date End Date Taking? Authorizing Provider  albuterol (PROAIR HFA) 108 (90 Base) MCG/ACT inhaler INHALE 2 PUFFS INTO THE LUNGS EVERY 6 (SIX) HOURS AS NEEDED FOR WHEEZING. Patient not taking: Reported on 11/08/2019 09/29/18   [name], MD  benzonatate (TESSALON) 100 MG capsule Take 1 capsule (100 mg total) by mouth 3 (three) times daily as needed for cough. Swallow whole.  Do not chew this capsule. 04/30/20   Charlesetta Shanks, MD  Blood Glucose Monitoring Suppl (ONE TOUCH ULTRA 2) w/Device KIT 1 kit by Does not apply route QID. ICD 10-code: E11.49. 07/11/19   [name], MD  cholestyramine (QUESTRAN) 4 g packet Take 1 packet (4 g total) by mouth 3 (three) times daily with meals. Patient not taking: Reported on 11/08/2019 05/19/18   Magrinat, Virgie Dad, MD  empagliflozin (JARDIANCE) 10 MG TABS tablet Take 10 mg by mouth daily. 11/08/19   [name], MD  gabapentin (NEURONTIN) 300 MG capsule May take up to 2 caps in AM and in afternoon and 3 in PM as needed for pain 11/17/19   [name], MD  HYDROcodone-acetaminophen (NORCO) 5-325 MG tablet Take 1-2 tablets by mouth as directed. Take prior to echocardiogram and prior to CT scan/xrays Patient not taking: Reported on 11/08/2019 10/05/19   Gardenia Phlegm, NP  insulin lispro (HUMALOG KWIKPEN) 100 UNIT/ML KwikPen INJECT 12 UNITS INTO THE SKIN 3 TIMES A DAY WITH MEALS 01/17/20   Chambliss, Jeb Levering, MD  insulin lispro (HUMALOG) 100 UNIT/ML injection Inject 0.1 mLs (10 Units total) into the skin 3 (three) times daily before meals. 12/09/16   Chambliss, Jeb Levering, MD   ipratropium-albuterol (DUONEB) 0.5-2.5 (3) MG/3ML SOLN Take 3 mLs by nebulization every 6 (six) hours. Patient not taking: Reported on 11/08/2019 10/01/14   Rama, Venetia Maxon, MD  LANTUS SOLOSTAR 100 UNIT/ML Solostar Pen INJECT 28 UNITS INTO THE SKIN DAILY AT 10PM 12/07/19   [name], MD  letrozole [facility]) 2.5 MG tablet Take 1 tablet (2.5 mg total) by mouth daily. 06/15/19  Magrinat, Virgie Dad, MD  loperamide (IMODIUM) 2 MG capsule TAKE 1 CAPSULE BY MOUTH AS NEEDED FOR DIARRHEA OR LOOSE STOOLS. MAX 6 PER DAY 04/24/20   Magrinat, Virgie Dad, MD  meclizine (ANTIVERT) 25 MG tablet Take 1 tablet (25 mg total) by mouth every 4 (four) hours as needed for up to 5 doses for dizziness. 07/12/19   Magrinat, Virgie Dad, MD  ondansetron (ZOFRAN) 4 MG tablet Take 1 tablet (4 mg total) by mouth every 8 (eight) hours as needed for up to 20 doses for nausea or vomiting. Patient not taking: Reported on 11/08/2019 07/07/19   Lennice Sites, DO  ONETOUCH ULTRA test strip USE AS INSTRUCTED TO TEST 3 TIMES DAILY. DX CODE: E11.49 04/24/20   [name], MD  OXYGEN Inhale into the lungs.    [provider]  predniSONE (DELTASONE) 20 MG tablet 2 tabs po daily x 3 days 04/30/20   Charlesetta Shanks, MD    Allergies    Meperidine hcl, Penicillins, Amoxicillin, Aspirin, and Percocet [oxycodone-acetaminophen]  Review of Systems   Review of Systems 10 systems reviewed and negative except as per HPI Physical Exam Updated Vital Signs BP (!) 150/91   Pulse 78   Temp 98.9 F (37.2 C) (Oral)   Resp (!) 24   Ht _0  (1.651 m)   Wt (!) 147.4 kg   SpO2 97%   BMI 54.08 kg/m   Physical Exam Constitutional:      Comments: Alert and nontoxic.  Patient is sitting in a chair at the bedside.  Morbid obesity.  Mild increased work of breathing.  Patient is on supplemental oxygen.  Oxygen saturation on nasal cannula oxygen 98%.  HENT:     Head: Normocephalic and atraumatic.     Mouth/Throat:     Pharynx:  Oropharynx is clear.  Eyes:     Extraocular Movements: Extraocular movements intact.     Conjunctiva/sclera: Conjunctivae normal.  Cardiovascular:     Rate and Rhythm: Normal rate and regular rhythm.  Pulmonary:     Comments: Mild increased work of breathing.  Dry cough.  Diffuse crackles mid and lower lung fields. Abdominal:     Comments: Morbidly obese.  Nontender.  Musculoskeletal:     Comments: 2+ edema b/l LE  Skin:    General: Skin is warm and dry.  Neurological:     General: No focal deficit present.     Mental Status: She is oriented to person, place, and time.     Coordination: Coordination normal.  Psychiatric:        Mood and Affect: Mood normal.     ED Results / Procedures / Treatments   Labs (all labs ordered are listed, but only abnormal results are displayed) Labs Reviewed  SARS CORONAVIRUS 2 BY RT PCR (HOSPITAL ORDER, [place] [facility]) - Abnormal; Notable for the following components:      Result Value   SARS Coronavirus 2 POSITIVE (*)    All other components within normal limits  COMPREHENSIVE METABOLIC PANEL - Abnormal; Notable for the following components:   Chloride 94 (*)    CO2 34 (*)    Glucose, Bld 246 (*)    Albumin 3.1 (*)    AST 54 (*)    ALT 56 (*)    All other components within normal limits  CBC WITH DIFFERENTIAL/PLATELET - Abnormal; Notable for the following components:   WBC 3.1 (*)    RBC 5.31 (*)    HCT 47.9 (*)  All other components within normal limits  D-DIMER, QUANTITATIVE (NOT AT [facility]) - Abnormal; Notable for the following components:   D-Dimer, Quant 1.00 (*)    All other components within normal limits  FIBRINOGEN - Abnormal; Notable for the following components:   Fibrinogen 702 (*)    All other components within normal limits  C-REACTIVE PROTEIN - Abnormal; Notable for the following components:   CRP 15.9 (*)    All other components within normal limits  CBG MONITORING, ED - Abnormal; Notable for  the following components:   Glucose-Capillary 262 (*)    All other components within normal limits  CULTURE, BLOOD (ROUTINE X 2)  CULTURE, BLOOD (ROUTINE X 2)  BRAIN NATRIURETIC PEPTIDE  LACTIC ACID, PLASMA  PROCALCITONIN  LACTATE DEHYDROGENASE  FERRITIN  URINALYSIS, ROUTINE W REFLEX MICROSCOPIC  TRIGLYCERIDES  TROPONIN I (HIGH SENSITIVITY)  TROPONIN I (HIGH SENSITIVITY)    EKG EKG Interpretation  Date/Time:  Monday April 30 2020 12:05:26 EDT Ventricular Rate:  77 PR Interval:    QRS Duration: 154 QT Interval:  404 QTC Calculation: 458 R Axis:   -78 Text Interpretation: 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 Prominence of the central pulmonary vasculature with mildly increased interstitial markings are seen throughout both lungs. There is overall shallow degree of aeration with subsegmental atelectasis at the left lung base. A right-sided MediPort catheter seen with the tip at the superior cavoatrial junction. IMPRESSION: Cardiomegaly and mild interstitial edema. Overall shallow degree of aeration with subsegmental atelectasis. Electronically Signed   By: Prudencio Pair M.D.   On: 04/30/2020 16:50    Procedures Procedures (including critical care time)  Medications Ordered in ED Medications  predniSONE (DELTASONE) tablet 40 mg (has no administration in time range)  ondansetron (ZOFRAN-ODT) disintegrating tablet 4 mg (has no administration in time range)    ED Course  I have reviewed the triage vital signs and the nursing notes.  Pertinent labs & imaging results that were available during my care of the patient were reviewed by me and considered in my medical decision making (see chart for details).     MDM Rules/Calculators/A&P                         Patient presents as outlined above.  She presents with classic symptoms of Covid-19 with exposure to multiple family members with symptoms.  Patient tests positive.  She describes becoming very hypoxic doing usual activities, however she reports this is off her oxygen.  She reports her oxygen tubing is too short so she routinely does a number of activities without oxygen and sometimes she travels or goes out without taking a tank.  She reports as of the past 2 days however, if she comes off the oxygen she becomes hypoxic down to the 70s and extremely short of breath.  Patient's chest x-ray showed some mild congestion but not severe pneumonia.  She is maintaining her oxygen saturation at 93 to 97% on her baseline 3 L at rest.  I do have concern with patient's severe comorbid illness that she is at high risk for decompensation.  We discussed hospitalization and inpatient treatment.  Patient reports she is too uncomfortable to stay in the  hospital.  She has pain in her back when she is in the ED stretcher but is also uncomfortable sitting in a chair.  She is not comfortable with the pure wick.   I offered to see if monoclonal antibody therapy could be administered in the emergency department but she does not wish to stay longer.  She reports that she will return if contacted for therapy.  I have reviewed return precautions.  I also reviewed the findings and concerns with the patient's daughter over the phone.  She is upset that her mother is uncomfortable and has had long ED wait time and not had anything to eat or drink.  She reports she will  get her mother and take her home.  I have counseled the patient I do not think she is able to travel or do any activities off of oxygen and that she must be on a tank.  Patient is high risk for worsening respiratory distress and return.  SIA GABRIELSEN was evaluated in Emergency Department on 04/30/2020 for the symptoms  described in the history of present illness. She was evaluated in the context of the global COVID-19 pandemic, which necessitated consideration that the patient might be at risk for infection with the SARS-CoV-2 virus that causes COVID-19. Institutional protocols and algorithms that pertain to the evaluation of patients at risk for COVID-19 are in a state of rapid change based on information released by regulatory bodies including the CDC and federal and state organizations. These policies and algorithms were followed during the patient's care in the ED. Final Clinical Impression(s) / ED Diagnoses Final diagnoses:  COVID-19  Severe comorbid illness  Chronic obstructive pulmonary disease, unspecified COPD type ([place])    Rx / DC Orders ED Discharge Orders         Ordered    predniSONE (DELTASONE) 20 MG tablet        04/30/20 2001    benzonatate (TESSALON) 100 MG capsule  3 times daily PRN        04/30/20 Samuella Bruin, MD 04/30/20 2014

## 2020-04-30 NOTE — Discharge Instructions (Addendum)
1.  Continue to take Tylenol every 4-6 hours for fever or body aches. 2.  You may continue to use your home albuterol solution every 2-4 hours as needed for shortness of breath or coughing. 3.  You have been given a dose of prednisone in the emergency department.  Due to your COPD continue this for an additional 3 days as prescribed. 4.  You will need to wear your oxygen continuously. 5.  Your information has been given to the monoclonal antibody clinic.  You will be called with more information.  6.   You are encouraged to stay in the hospital for admission due to your severe additional medical problems, especially COPD, and increasing shortness of breath. Return to the emergency department immediately if you have worsening shortness of breath or other concerning symptoms.

## 2020-04-30 NOTE — ED Triage Notes (Signed)
Patient c/o SOB, weakness, fever last week, diarrhea, chills since last week. Patient is on home O2 3 L/min via [place], but came to the ED without her oxygen. Room air sats in triage -85%. Patient p[laced on O2 3L/min via [place] and sats increased to 95%. Patient states her grandson came home last week and he had a virus. Patient also states her son has a virus too.

## 2020-05-01 ENCOUNTER — Telehealth: Payer: Self-pay | Admitting: Nurse Practitioner

## 2020-05-01 DIAGNOSIS — I5020 Unspecified systolic (congestive) heart failure: Secondary | ICD-10-CM | POA: Diagnosis not present

## 2020-05-01 NOTE — Telephone Encounter (Signed)
Contacted patient for a ED COVID Follow up, Patient denied appointment. Stated she would give [place] a call back.

## 2020-05-01 NOTE — Telephone Encounter (Signed)
-----   Message from Fenton Foy, NP sent at 05/01/2020 10:42 AM EDT ----- Regarding: FW: Resp clinic Please try to schedule this patient.  Thanks Kenney Houseman  ----- Message ----- From: Kalaoa Callas, NP Sent: 05/01/2020  10:34 AM EDT To: Fenton Foy, NP Subject: Resp clinic                                    Mindi Junker - this patient would benefit from follow up asap in respiratory care clinic. She was dx covid positive recently with ER visit and refused admission.  They tried to contact us for MAB but she is too sick for it.   She is set up with oxygen at home supposedly, although I cannot find any notes describing who is giving that to her and worry it may not be the case.   Can you team reach out to set up an appt with you for respiratory follow up? ~Stephanie

## 2020-05-02 ENCOUNTER — Encounter: Payer: Self-pay | Admitting: Family Medicine

## 2020-05-03 ENCOUNTER — Emergency Department (HOSPITAL_COMMUNITY): Payer: Medicare Other

## 2020-05-03 ENCOUNTER — Inpatient Hospital Stay (HOSPITAL_COMMUNITY)
Admission: EM | Admit: 2020-05-03 | Discharge: 2020-05-10 | DRG: 177 | Disposition: A | Discharge status: Home / Self Care | Payer: Medicare Other | Attending: Family Medicine | Admitting: Family Medicine

## 2020-05-03 ENCOUNTER — Telehealth: Payer: Self-pay

## 2020-05-03 ENCOUNTER — Encounter (HOSPITAL_COMMUNITY): Payer: Self-pay | Admitting: Emergency Medicine

## 2020-05-03 ENCOUNTER — Other Ambulatory Visit: Payer: Self-pay

## 2020-05-03 DIAGNOSIS — G4730 Sleep apnea, unspecified: Secondary | ICD-10-CM | POA: Diagnosis present

## 2020-05-03 DIAGNOSIS — Z8249 Family history of ischemic heart disease and other diseases of the circulatory system: Secondary | ICD-10-CM | POA: Diagnosis not present

## 2020-05-03 DIAGNOSIS — M1990 Unspecified osteoarthritis, unspecified site: Secondary | ICD-10-CM | POA: Diagnosis present

## 2020-05-03 DIAGNOSIS — Z79811 Long term (current) use of aromatase inhibitors: Secondary | ICD-10-CM

## 2020-05-03 DIAGNOSIS — R918 Other nonspecific abnormal finding of lung field: Secondary | ICD-10-CM | POA: Diagnosis not present

## 2020-05-03 DIAGNOSIS — U071 COVID-19: Principal | ICD-10-CM

## 2020-05-03 DIAGNOSIS — Z803 Family history of malignant neoplasm of breast: Secondary | ICD-10-CM | POA: Diagnosis not present

## 2020-05-03 DIAGNOSIS — Z170 Estrogen receptor positive status [ER+]: Secondary | ICD-10-CM

## 2020-05-03 DIAGNOSIS — E1149 Type 2 diabetes mellitus with other diabetic neurological complication: Secondary | ICD-10-CM | POA: Diagnosis present

## 2020-05-03 DIAGNOSIS — J440 Chronic obstructive pulmonary disease with acute lower respiratory infection: Secondary | ICD-10-CM | POA: Diagnosis present

## 2020-05-03 DIAGNOSIS — R0902 Hypoxemia: Secondary | ICD-10-CM

## 2020-05-03 DIAGNOSIS — C50212 Malignant neoplasm of upper-inner quadrant of left female breast: Secondary | ICD-10-CM | POA: Diagnosis present

## 2020-05-03 DIAGNOSIS — Z87891 Personal history of nicotine dependence: Secondary | ICD-10-CM

## 2020-05-03 DIAGNOSIS — E1165 Type 2 diabetes mellitus with hyperglycemia: Secondary | ICD-10-CM | POA: Diagnosis not present

## 2020-05-03 DIAGNOSIS — J1282 Pneumonia due to coronavirus disease 2019: Secondary | ICD-10-CM | POA: Diagnosis not present

## 2020-05-03 DIAGNOSIS — I10 Essential (primary) hypertension: Secondary | ICD-10-CM | POA: Diagnosis present

## 2020-05-03 DIAGNOSIS — K1120 Sialoadenitis, unspecified: Secondary | ICD-10-CM

## 2020-05-03 DIAGNOSIS — K760 Fatty (change of) liver, not elsewhere classified: Secondary | ICD-10-CM | POA: Diagnosis present

## 2020-05-03 DIAGNOSIS — G8929 Other chronic pain: Secondary | ICD-10-CM | POA: Diagnosis present

## 2020-05-03 DIAGNOSIS — Z9981 Dependence on supplemental oxygen: Secondary | ICD-10-CM

## 2020-05-03 DIAGNOSIS — M7989 Other specified soft tissue disorders: Secondary | ICD-10-CM

## 2020-05-03 DIAGNOSIS — Z880 Allergy status to penicillin: Secondary | ICD-10-CM

## 2020-05-03 DIAGNOSIS — C7800 Secondary malignant neoplasm of unspecified lung: Secondary | ICD-10-CM | POA: Diagnosis present

## 2020-05-03 DIAGNOSIS — J9601 Acute respiratory failure with hypoxia: Secondary | ICD-10-CM | POA: Diagnosis present

## 2020-05-03 DIAGNOSIS — R6889 Other general symptoms and signs: Secondary | ICD-10-CM | POA: Diagnosis not present

## 2020-05-03 DIAGNOSIS — C50912 Malignant neoplasm of unspecified site of left female breast: Secondary | ICD-10-CM | POA: Diagnosis not present

## 2020-05-03 DIAGNOSIS — Z886 Allergy status to analgesic agent status: Secondary | ICD-10-CM

## 2020-05-03 DIAGNOSIS — Z888 Allergy status to other drugs, medicaments and biological substances status: Secondary | ICD-10-CM

## 2020-05-03 DIAGNOSIS — R0602 Shortness of breath: Secondary | ICD-10-CM | POA: Diagnosis not present

## 2020-05-03 DIAGNOSIS — M549 Dorsalgia, unspecified: Secondary | ICD-10-CM | POA: Diagnosis present

## 2020-05-03 DIAGNOSIS — I5020 Unspecified systolic (congestive) heart failure: Secondary | ICD-10-CM | POA: Diagnosis not present

## 2020-05-03 DIAGNOSIS — R911 Solitary pulmonary nodule: Secondary | ICD-10-CM | POA: Diagnosis not present

## 2020-05-03 DIAGNOSIS — Z6841 Body Mass Index (BMI) 40.0 and over, adult: Secondary | ICD-10-CM

## 2020-05-03 DIAGNOSIS — M479 Spondylosis, unspecified: Secondary | ICD-10-CM | POA: Diagnosis present

## 2020-05-03 DIAGNOSIS — M25569 Pain in unspecified knee: Secondary | ICD-10-CM | POA: Diagnosis not present

## 2020-05-03 DIAGNOSIS — Z833 Family history of diabetes mellitus: Secondary | ICD-10-CM

## 2020-05-03 DIAGNOSIS — C3490 Malignant neoplasm of unspecified part of unspecified bronchus or lung: Secondary | ICD-10-CM | POA: Diagnosis not present

## 2020-05-03 DIAGNOSIS — A0839 Other viral enteritis: Secondary | ICD-10-CM | POA: Diagnosis present

## 2020-05-03 DIAGNOSIS — Z794 Long term (current) use of insulin: Secondary | ICD-10-CM

## 2020-05-03 DIAGNOSIS — Z885 Allergy status to narcotic agent status: Secondary | ICD-10-CM

## 2020-05-03 DIAGNOSIS — R062 Wheezing: Secondary | ICD-10-CM | POA: Diagnosis not present

## 2020-05-03 DIAGNOSIS — Z743 Need for continuous supervision: Secondary | ICD-10-CM | POA: Diagnosis not present

## 2020-05-03 DIAGNOSIS — Z79899 Other long term (current) drug therapy: Secondary | ICD-10-CM

## 2020-05-03 LAB — URINALYSIS, ROUTINE W REFLEX MICROSCOPIC
Bilirubin Urine: NEGATIVE
Glucose, UA: >=500 mg/dL — AB
Ketones, ur: 20 mg/dL — AB
Leukocytes,Ua: NEGATIVE
Nitrite: NEGATIVE
Protein, ur: 100 mg/dL — AB
Specific Gravity, Urine: 1.041 — ABNORMAL HIGH (ref 1.005–1.030)
pH: 6 (ref 5.0–8.0)

## 2020-05-03 LAB — CBC WITH DIFFERENTIAL/PLATELET
Abs Immature Granulocytes: 0.02 10*3/uL (ref 0.00–0.07)
Basophils Absolute: 0 10*3/uL (ref 0.0–0.1)
Basophils Relative: 0 %
Eosinophils Absolute: 0 10*3/uL (ref 0.0–0.5)
Eosinophils Relative: 0 %
HCT: 42.6 % (ref 36.0–46.0)
Hemoglobin: 12.9 g/dL (ref 12.0–15.0)
Immature Granulocytes: 0 %
Lymphocytes Relative: 8 %
Lymphs Abs: 0.4 10*3/uL — ABNORMAL LOW (ref 0.7–4.0)
MCH: 26.9 pg (ref 26.0–34.0)
MCHC: 30.3 g/dL (ref 30.0–36.0)
MCV: 88.9 fL (ref 80.0–100.0)
Monocytes Absolute: 0.2 10*3/uL (ref 0.1–1.0)
Monocytes Relative: 5 %
Neutro Abs: 4.1 10*3/uL (ref 1.7–7.7)
Neutrophils Relative %: 87 %
Platelets: 201 10*3/uL (ref 150–400)
RBC: 4.79 MIL/uL (ref 3.87–5.11)
RDW: 14 % (ref 11.5–15.5)
WBC: 4.7 10*3/uL (ref 4.0–10.5)
nRBC: 0 % (ref 0.0–0.2)

## 2020-05-03 LAB — COMPREHENSIVE METABOLIC PANEL
ALT: 50 U/L — ABNORMAL HIGH (ref 0–44)
AST: 47 U/L — ABNORMAL HIGH (ref 15–41)
Albumin: 2.5 g/dL — ABNORMAL LOW (ref 3.5–5.0)
Alkaline Phosphatase: 112 U/L (ref 38–126)
Anion gap: 11 (ref 5–15)
BUN: 13 mg/dL (ref 8–23)
CO2: 32 mmol/L (ref 22–32)
Calcium: 8.4 mg/dL — ABNORMAL LOW (ref 8.9–10.3)
Chloride: 94 mmol/L — ABNORMAL LOW (ref 98–111)
Creatinine, Ser: 0.7 mg/dL (ref 0.44–1.00)
GFR calc Af Amer: >60 mL/min (ref >60)
GFR calc non Af Amer: >60 mL/min (ref >60)
Glucose, Bld: 358 mg/dL — ABNORMAL HIGH (ref 70–99)
Potassium: 4.7 mmol/L (ref 3.5–5.1)
Sodium: 137 mmol/L (ref 135–145)
Total Bilirubin: 0.4 mg/dL (ref 0.3–1.2)
Total Protein: 6.6 g/dL (ref 6.5–8.1)

## 2020-05-03 LAB — LIPASE, BLOOD: Lipase: 20 U/L (ref 11–51)

## 2020-05-03 LAB — BRAIN NATRIURETIC PEPTIDE: B Natriuretic Peptide: 61 pg/mL (ref 0.0–100.0)

## 2020-05-03 LAB — TROPONIN I (HIGH SENSITIVITY): Troponin I (High Sensitivity): 9 ng/L (ref <18)

## 2020-05-03 MED ORDER — SODIUM CHLORIDE 0.9 % IV SOLN
100.0000 mg | Freq: Every day | INTRAVENOUS | Status: DC
  Filled 2020-05-03: qty 20

## 2020-05-03 MED ORDER — IOHEXOL 350 MG/ML SOLN
75.0000 mL | Freq: Once | INTRAVENOUS | Status: AC | PRN
  Administered 2020-05-03: 75 mL via INTRAVENOUS

## 2020-05-03 MED ORDER — SODIUM CHLORIDE 0.9 % IV SOLN
200.0000 mg | Freq: Once | INTRAVENOUS | Status: AC
  Administered 2020-05-04: 200 mg via INTRAVENOUS
  Filled 2020-05-03: qty 40

## 2020-05-03 MED ORDER — SODIUM CHLORIDE 0.9% FLUSH
10.0000 mL | Freq: Two times a day (BID) | INTRAVENOUS | Status: DC
  Administered 2020-05-04 – 2020-05-05 (×3): 10 mL
  Administered 2020-05-05: 40 mL
  Administered 2020-05-06 – 2020-05-08 (×5): 10 mL

## 2020-05-03 MED ORDER — SODIUM CHLORIDE 0.9% FLUSH
10.0000 mL | INTRAVENOUS | Status: DC | PRN
  Administered 2020-05-07: 10 mL

## 2020-05-03 MED ORDER — CHLORHEXIDINE GLUCONATE CLOTH 2 % EX PADS
6.0000 | MEDICATED_PAD | Freq: Every day | CUTANEOUS | Status: DC
  Administered 2020-05-05 – 2020-05-10 (×6): 6 via TOPICAL

## 2020-05-03 NOTE — Progress Notes (Signed)
Lower extremity venous bilateral study completed.  Preliminary results relayed to ED provider.   See CV Proc for preliminary results report.   Darlin Coco

## 2020-05-03 NOTE — Telephone Encounter (Signed)
Received phone call from patient regarding worsening respiratory symptoms with COVID. Patient reports difficulty breathing with oxygen saturations down to 62% with exertion. Patient reports that she is on continuous 3 L oxygen.   After speaking with Dr. Erin Hearing, advised patient to proceed to ED for further evaluation and possible admission. Patient states that she needs an ambulance to come pick her up. Patient became confused stating that she did not know how to call for an ambulance. Called 911 for patient and EMS is being dispatched to patient as soon as possible.   Returned call to patient to inform that help is on the way.   Talbot Grumbling, RN

## 2020-05-03 NOTE — ED Triage Notes (Signed)
Pt here from home with with known covid  Pt is on 3 liters O2 otc from her copd , sats 90% on 3 liters pt just got sob while walking to bathroom

## 2020-05-03 NOTE — H&P (Addendum)
[facility] Admission History and Physical Service Pager: [phone]  Patient name: [name] Medical record number: [id number] Date of birth: Aug 04, 1951 Age: 69 y.o. Gender: female  Primary Care Provider: [name], MD Consultants: None Code Status: Full  Preferred Emergency Contact: Dannial Monarch (daughter) [phone]  Chief Complaint: Shortness of breath with COVID-19  Assessment and Plan: [name] is a 69 y.o. female presenting with worsening shortness of breath and COVID-19 . PMH is significant for T2DM, HTN, metastatic breast cancer on chemo, COPD on 3L, recent COVID diagnosis.  Shortness of breath 2/2 COVID pneumonia Patient reports symptomatic with SoB, cough, fever and diarrhea since 9/6. Patient having increased weakness and SoB with reported O2 sat in the 70s. Patient has history of COPD with 3L O2 at baseline. Physical exam significant for tachypnea, crackles in all lung fields, b/l LE swelling. Admission labs: AST/ALT 47/50, BNP 61, Trop 9, WBC 4.7. COVID labs were collected on 9/14 but were not initially collected on admission, they are now ordered to be collected with morning labs. CXR: diffusely increased interstitial markings seen throughout both lungs and likely consistent with atelectasis and/or infectious etiology. CTA: negative pulmonary embolism, extensive b/l ground-glass opacity consistent with multifocal infection including COVID-19, small pulmonary nodules in lung apices obscured by airspace disease that are unchanged, unchanged prominent mediastinal lymph nodes. - Admit to med-surg, attending Dr. Ardelia Mems - Tylenol 650mg  q6h PRN - Remdesivir 200mg  x1 followed by 100mg  daily x4 days - Dexamethasone 6mg  x10 days - Linagliptin 5mg  daily -Continuous pulse ox - Supplemental O2 as needed for O2 goal 90-94% -Up with assistance -PT/OT eval and treat, follow-up on ambulation with pulse ox   T2DM Last A1C (11/08/19) 8.8; BS  on admission 358, patient has been unable to tolerate much PO. Home medications include Lantus 28U QHS, Humalog 10U TID with meals.  - sSSI - CBGs TID with meals and QHS - Continue to monitor and adjust medications as necessary - Morning HbA1C  COPD Patient has COPD with baseline 3L O2 at home. Home meds also include albuterol. - Continue albuterol inhaler  HTN Not taking any medications currently. On admission BP 162/61. Patient was previously prescribed amlodipine but is no longer taking it. Last visit with PCP in March 2021 recommended possible ARB. - Continue to monitor -Consider addition of an ARB if blood pressure is not well controlled  Malignant breast cancer Patient is currently followed by oncology. Home meds include letrozole 2.5mg  daily. - Continue home letrozole if on formulary  Osteoarthritis of spine Patient has chronic low back pain with possible neuropathic symptoms of burning/stinging. Home medications include Gabapentin reportedly being taken at home as 300mg  PRN for pain, hold at the moment unless patient complains of pain and then consider restarting.  - Hold gabapentin   FEN/GI: Carb mod/heart healthy Prophylaxis: Lovenox  Disposition: Med-surg  History of Present Illness:  [name] is a 69 y.o. female presenting with increasing shortness of breath. Patient was diagnosed with COVID19 on 9/6, pt unvaccinated. Initial symptoms included fever, diarrhea, coughing, and dyspnea. She was worsening in the last week and went to [facility] on 9/13 because of her home O2 sats dropping into the 80s while walking to the bathroom but was not admitted. Today patient was having even worsening dyspnea and became anxious when her O2 saturation reportedly dropped to 70s while walking back from the bathroom. Patient reports she will start feeling like she is choking for air, she has  a non-productive cough and has been using her albuterol nebulizer treatment 3x per day for the last  several days which helped her cough. Patient also reports increased leg swelling (L>R) and that before she was diagnosed with COVID they were very tight with swelling and "very warm to touch" but denies redness.   Review Of Systems: Per HPI with the following additions:  Review of Systems  Constitutional: Positive for appetite change and fever.  Eyes: Negative for visual disturbance.  Respiratory: Positive for cough and shortness of breath.   Cardiovascular: Positive for chest pain ("sore from coughing").  Gastrointestinal: Positive for diarrhea and nausea. Negative for vomiting.  Genitourinary: Negative for dysuria.  Neurological: Positive for weakness.     Patient Active Problem List   Diagnosis Date Noted  . COVID-19 05/03/2020  . Port-A-Cath in place 03/21/2020  . Goals of care, counseling/discussion 08/05/2017  . Obesity, morbid, BMI 50 or higher ([place]) 10/02/2015  . Diabetes mellitus type 2 with neurological manifestations ([place]) 09/28/2014  . Malignant neoplasm of upper-inner quadrant of left breast in female, estrogen receptor positive ([place]) 05/23/2013  . 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 ([place]) 12/14/2012  . Anxiety state 10/15/2006  . HYPERTENSION, BENIGN SYSTEMIC 10/15/2006  . Osteoarthritis of spine 10/15/2006  . LUMBAR SPINAL STENOSIS 10/15/2006  . Sleep apnea 10/15/2006    Past Medical History: Past Medical History:  Diagnosis Date  . Arthritis   . Back pain   . Breast cancer ([place]) dx'd 11/2012   left  . Chest pain   . COPD (chronic obstructive pulmonary disease) ([place])   . Diabetes mellitus without complication ([place]) 03/26/9832  . Ear pain   . Fatty liver 6/03  . Hypertension   . Lung disease   . Lung metastases ([place]) dx'd 11/2012  . Obesity, unspecified   . Other abnormal glucose   . Suicide attempt ([place]) 1996  . Syncope and collapse   . Unspecified sleep apnea     Past Surgical History: Past Surgical History:  Procedure Laterality Date  . CARDIAC  CATHETERIZATION     2007  . CHOLECYSTECTOMY    . TUBAL LIGATION      Social History: Social History   Tobacco Use  . Smoking status: Former Smoker    Packs/day: 3.00    Years: 5.00    Pack years: 15.00    Types: Cigarettes    Quit date: 08/18/1968    Years since quitting: 51.7  . Smokeless tobacco: Never Used  Vaping Use  . Vaping Use: Never used  Substance Use Topics  . Alcohol use: No    Alcohol/week: 0.0 standard drinks  . Drug use: No   Additional social history:  Please also refer to relevant sections of EMR.  Family History: Family History  Problem Relation Age of Onset  . Coronary artery disease Father 80  . Diabetes Father   . Heart disease Father   . Breast cancer Mother 39  . Cancer Mother 64       breast  . Aplastic anemia Daughter        died at age 10  . Cancer Maternal Aunt 40       ovarian  . Cancer Maternal Grandmother 55       ovarian  . Cancer Paternal Aunt 107       ovarian/breast/breast  . Coronary artery disease Sister 75  . Coronary artery disease Brother 60    Allergies and Medications: Allergies  Allergen Reactions  . Meperidine Hcl Anaphylaxis  .  Penicillins Anaphylaxis    Has patient had a PCN reaction causing immediate rash, facial/tongue/throat swelling, SOB or lightheadedness with hypotension: yes Has patient had a PCN reaction causing severe rash involving mucus membranes or skin necrosis: no Has patient had a PCN reaction that required hospitalization yes Has patient had a PCN reaction occurring within the last 10 years: no If all of the above answers are "NO", then may proceed with Cephalosporin use.   Marland Kitchen Amoxicillin     REACTION: unspecified  . Aspirin Nausea And Vomiting    REACTION: unspecified  . Percocet [Oxycodone-Acetaminophen]    Current Facility-Administered Medications on File Prior to Encounter  Medication Dose Route Frequency Provider Last Rate Last Admin  . sodium chloride flush (NS) 0.9 % injection 10 mL  10  mL Intravenous PRN Magrinat, Virgie Dad, MD       Current Outpatient Medications on File Prior to Encounter  Medication Sig Dispense Refill  . albuterol (PROAIR HFA) 108 (90 Base) MCG/ACT inhaler INHALE 2 PUFFS INTO THE LUNGS EVERY 6 (SIX) HOURS AS NEEDED FOR WHEEZING. (Patient not taking: Reported on 11/08/2019) 8.5 Inhaler 1  . benzonatate (TESSALON) 100 MG capsule Take 1 capsule (100 mg total) by mouth 3 (three) times daily as needed for cough. Swallow whole.  Do not chew this capsule. 21 capsule 0  . Blood Glucose Monitoring Suppl (ONE TOUCH ULTRA 2) w/Device KIT 1 kit by Does not apply route QID. ICD 10-code: E11.49. 1 kit 0  . cholestyramine (QUESTRAN) 4 g packet Take 1 packet (4 g total) by mouth 3 (three) times daily with meals. (Patient not taking: Reported on 11/08/2019) 60 each 12  . empagliflozin (JARDIANCE) 10 MG TABS tablet Take 10 mg by mouth daily. 90 tablet 3  . gabapentin (NEURONTIN) 300 MG capsule May take up to 2 caps in AM and in afternoon and 3 in PM as needed for pain 210 capsule 0  . HYDROcodone-acetaminophen (NORCO) 5-325 MG tablet Take 1-2 tablets by mouth as directed. Take prior to echocardiogram and prior to CT scan/xrays (Patient not taking: Reported on 11/08/2019) 4 tablet 0  . insulin lispro (HUMALOG KWIKPEN) 100 UNIT/ML KwikPen INJECT 12 UNITS INTO THE SKIN 3 TIMES A DAY WITH MEALS 15 mL 1  . insulin lispro (HUMALOG) 100 UNIT/ML injection Inject 0.1 mLs (10 Units total) into the skin 3 (three) times daily before meals. 10 mL 11  . ipratropium-albuterol (DUONEB) 0.5-2.5 (3) MG/3ML SOLN Take 3 mLs by nebulization every 6 (six) hours. (Patient not taking: Reported on 11/08/2019) 360 mL 2  . LANTUS SOLOSTAR 100 UNIT/ML Solostar Pen INJECT 28 UNITS INTO THE SKIN DAILY AT 10PM 15 mL 3  . letrozole (FEMARA) 2.5 MG tablet Take 1 tablet (2.5 mg total) by mouth daily. 90 tablet 12  . loperamide (IMODIUM) 2 MG capsule TAKE 1 CAPSULE BY MOUTH AS NEEDED FOR DIARRHEA OR LOOSE STOOLS. MAX  6 PER DAY 30 capsule 1  . meclizine (ANTIVERT) 25 MG tablet Take 1 tablet (25 mg total) by mouth every 4 (four) hours as needed for up to 5 doses for dizziness. 60 tablet 0  . ondansetron (ZOFRAN) 4 MG tablet Take 1 tablet (4 mg total) by mouth every 8 (eight) hours as needed for up to 20 doses for nausea or vomiting. (Patient not taking: Reported on 11/08/2019) 20 tablet 0  . ONETOUCH ULTRA test strip USE AS INSTRUCTED TO TEST 3 TIMES DAILY. DX CODE: E11.49 100 strip 0  . OXYGEN Inhale into the  lungs.    . predniSONE (DELTASONE) 20 MG tablet 2 tabs po daily x 3 days 6 tablet 0    Objective: BP (!) 148/59   Pulse 65   Temp 98.9 F (37.2 C) (Oral)   Resp 17   Ht $R'5\' 5"'cu$  (1.651 m)   Wt (!) 147.4 kg   SpO2 93%   BMI 54.08 kg/m  Exam: General: ill appearing, morbidly obese, mild distress, patient has porta-cath placed on chest. Cardiovascular: RRR, no m/r/g appreciated, 1+ pitting edema of b/l LE (L>R) Respiratory: mild distress, on 4L [place], dry cough on exam, crackles in all fields, patient hyperventilates with increased anxiety Gastrointestinal: obese, non-tender, soft, non-distended MSK: pitting edema of b/l LE (L>R), calves tender to palpation Neuro: A&Ox4 Psych: anxious, shows good insight  Labs and Imaging: CBC BMET  Recent Labs  Lab 05/03/20 1622  WBC 4.7  HGB 12.9  HCT 42.6  PLT 201   Recent Labs  Lab 05/03/20 1622  NA 137  K 4.7  CL 94*  CO2 32  BUN 13  CREATININE 0.70  GLUCOSE 358*  CALCIUM 8.4*       Lilland, Alana, DO 05/03/2020, 9:21 PM PGY-1, [place] Intern pager: [phone], text pages welcome  FPTS Upper-Level Resident Addendum   I have independently interviewed and examined the patient. I have discussed the above with the original author and agree with their documentation. My edits for correction/addition/clarification are in blue.Please see also any attending notes.   Gifford Shave, MD PGY-2, [place]  [facility] 05/04/2020 1:44 AM  FPTS Service pager: [phone] (text pages welcome through [facility])

## 2020-05-03 NOTE — ED Provider Notes (Signed)
[facility] EMERGENCY DEPARTMENT Provider Note   CSN: [id number] Arrival date & time: 05/03/20  1015     History No chief complaint on file.   [name] is a 69 y.o. female.  The history is provided by the patient and medical records. No language interpreter was used.  Shortness of Breath Severity:  Severe Onset quality:  Gradual Timing:  Constant Progression:  Waxing and waning Chronicity:  New Context: URI   Relieved by:  Oxygen Worsened by:  Coughing, exertion and deep breathing Ineffective treatments:  None tried Associated symptoms: chest pain, cough and fever   Associated symptoms: no abdominal pain, no diaphoresis, no headaches, no neck pain, no sputum production, no vomiting and no wheezing   Risk factors: obesity        Past Medical History:  Diagnosis Date  . Arthritis   . Back pain   . Breast cancer ([place]) dx'd 11/2012   left  . Chest pain   . COPD (chronic obstructive pulmonary disease) ([place])   . Diabetes mellitus without complication ([place]) 8/67/5449  . Ear pain   . Fatty liver 6/03  . Hypertension   . Lung disease   . Lung metastases ([place]) dx'd 11/2012  . Obesity, unspecified   . Other abnormal glucose   . Suicide attempt ([place]) 1996  . Syncope and collapse   . Unspecified sleep apnea     Patient Active Problem List   Diagnosis Date Noted  . Port-A-Cath in place 03/21/2020  . Goals of care, counseling/discussion 08/05/2017  . Obesity, morbid, BMI 50 or higher ([place]) 10/02/2015  . Diabetes mellitus type 2 with neurological manifestations ([place]) 09/28/2014  . Malignant neoplasm of upper-inner quadrant of left breast in female, estrogen receptor positive ([place]) 05/23/2013  . 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 ([place]) 12/14/2012  . Anxiety state 10/15/2006  . HYPERTENSION, BENIGN SYSTEMIC 10/15/2006  . Osteoarthritis of spine 10/15/2006  . LUMBAR SPINAL STENOSIS 10/15/2006  . Sleep apnea 10/15/2006    Past Surgical History:    Procedure Laterality Date  . CARDIAC CATHETERIZATION     2007  . CHOLECYSTECTOMY    . TUBAL LIGATION       OB History   No obstetric history on file.     Family History  Problem Relation Age of Onset  . Coronary artery disease Father 21  . Diabetes Father   . Heart disease Father   . Breast cancer Mother 91  . Cancer Mother 42       breast  . Aplastic anemia Daughter        died at age 17  . Cancer Maternal Aunt 40       ovarian  . Cancer Maternal Grandmother 55       ovarian  . Cancer Paternal Aunt 66       ovarian/breast/breast  . Coronary artery disease Sister 58  . Coronary artery disease Brother 51    Social History   Tobacco Use  . Smoking status: Former Smoker    Packs/day: 3.00    Years: 5.00    Pack years: 15.00    Types: Cigarettes    Quit date: 08/18/1968    Years since quitting: 51.7  . Smokeless tobacco: Never Used  Vaping Use  . Vaping Use: Never used  Substance Use Topics  . Alcohol use: No    Alcohol/week: 0.0 standard drinks  . Drug use: No    Home Medications Prior to Admission medications   Medication  Sig Start Date End Date Taking? Authorizing Provider  albuterol (PROAIR HFA) 108 (90 Base) MCG/ACT inhaler INHALE 2 PUFFS INTO THE LUNGS EVERY 6 (SIX) HOURS AS NEEDED FOR WHEEZING. Patient not taking: Reported on 11/08/2019 09/29/18   [name], MD  benzonatate (TESSALON) 100 MG capsule Take 1 capsule (100 mg total) by mouth 3 (three) times daily as needed for cough. Swallow whole.  Do not chew this capsule. 04/30/20   Charlesetta Shanks, MD  Blood Glucose Monitoring Suppl (ONE TOUCH ULTRA 2) w/Device KIT 1 kit by Does not apply route QID. ICD 10-code: E11.49. 07/11/19   [name], MD  cholestyramine (QUESTRAN) 4 g packet Take 1 packet (4 g total) by mouth 3 (three) times daily with meals. Patient not taking: Reported on 11/08/2019 05/19/18   Magrinat, Virgie Dad, MD  empagliflozin (JARDIANCE) 10 MG TABS tablet Take 10 mg by  mouth daily. 11/08/19   [name], MD  gabapentin (NEURONTIN) 300 MG capsule May take up to 2 caps in AM and in afternoon and 3 in PM as needed for pain 11/17/19   [name], MD  HYDROcodone-acetaminophen (NORCO) 5-325 MG tablet Take 1-2 tablets by mouth as directed. Take prior to echocardiogram and prior to CT scan/xrays Patient not taking: Reported on 11/08/2019 10/05/19   [place]ia Phlegm, NP  insulin lispro (HUMALOG KWIKPEN) 100 UNIT/ML KwikPen INJECT 12 UNITS INTO THE SKIN 3 TIMES A DAY WITH MEALS 01/17/20   Chambliss, Jeb Levering, MD  insulin lispro (HUMALOG) 100 UNIT/ML injection Inject 0.1 mLs (10 Units total) into the skin 3 (three) times daily before meals. 12/09/16   Chambliss, Jeb Levering, MD  ipratropium-albuterol (DUONEB) 0.5-2.5 (3) MG/3ML SOLN Take 3 mLs by nebulization every 6 (six) hours. Patient not taking: Reported on 11/08/2019 10/01/14   Rama, Venetia Maxon, MD  LANTUS SOLOSTAR 100 UNIT/ML Solostar Pen INJECT 28 UNITS INTO THE SKIN DAILY AT 10PM 12/07/19   [name], MD  letrozole [facility]) 2.5 MG tablet Take 1 tablet (2.5 mg total) by mouth daily. 06/15/19   Magrinat, Virgie Dad, MD  loperamide (IMODIUM) 2 MG capsule TAKE 1 CAPSULE BY MOUTH AS NEEDED FOR DIARRHEA OR LOOSE STOOLS. MAX 6 PER DAY 04/24/20   Magrinat, Virgie Dad, MD  meclizine (ANTIVERT) 25 MG tablet Take 1 tablet (25 mg total) by mouth every 4 (four) hours as needed for up to 5 doses for dizziness. 07/12/19   Magrinat, Virgie Dad, MD  ondansetron (ZOFRAN) 4 MG tablet Take 1 tablet (4 mg total) by mouth every 8 (eight) hours as needed for up to 20 doses for nausea or vomiting. Patient not taking: Reported on 11/08/2019 07/07/19   Lennice Sites, DO  ONETOUCH ULTRA test strip USE AS INSTRUCTED TO TEST 3 TIMES DAILY. DX CODE: E11.49 04/24/20   [name], MD  OXYGEN Inhale into the lungs.    [provider]  predniSONE (DELTASONE) 20 MG tablet 2 tabs po daily x 3 days 04/30/20    Charlesetta Shanks, MD    Allergies    Meperidine hcl, Penicillins, Amoxicillin, Aspirin, and Percocet [oxycodone-acetaminophen]  Review of Systems   Review of Systems  Constitutional: Positive for chills, fatigue and fever. Negative for diaphoresis.  HENT: Positive for congestion.   Eyes: Negative for visual disturbance.  Respiratory: Positive for cough, chest tightness and shortness of breath. Negative for sputum production and wheezing.   Cardiovascular: Positive for chest pain.  Gastrointestinal: Positive for diarrhea and nausea. Negative for abdominal  pain, constipation and vomiting.  Genitourinary: Negative for dysuria.  Musculoskeletal: Negative for back pain, neck pain and neck stiffness.  Skin: Negative for wound.  Neurological: Negative for weakness, light-headedness and headaches.  Psychiatric/Behavioral: Negative for agitation and confusion.  All other systems reviewed and are negative.   Physical Exam Updated Vital Signs BP (!) 148/59   Pulse 65   Temp 98.9 F (37.2 C) (Oral)   Resp 17   Ht 5' 5" (1.651 m)   Wt (!) 147.4 kg   SpO2 93%   BMI 54.08 kg/m   Physical Exam Vitals and nursing note reviewed.  Constitutional:      General: She is not in acute distress.    Appearance: She is well-developed. She is not ill-appearing, toxic-appearing or diaphoretic.  HENT:     Head: Normocephalic and atraumatic.     Right Ear: External ear normal.     Left Ear: External ear normal.     Nose: Nose normal. No congestion or rhinorrhea.     Mouth/Throat:     Mouth: Mucous membranes are moist.     Pharynx: No oropharyngeal exudate or posterior oropharyngeal erythema.  Eyes:     Conjunctiva/sclera: Conjunctivae normal.     Pupils: Pupils are equal, round, and reactive to light.  Cardiovascular:     Rate and Rhythm: Tachycardia present.     Pulses: Normal pulses.     Heart sounds: No murmur heard.   Pulmonary:     Effort: No respiratory distress.     Breath sounds: No  stridor. Rhonchi and rales present. No wheezing.  Chest:     Chest wall: No tenderness.  Abdominal:     General: Abdomen is flat. There is no distension.     Tenderness: There is no abdominal tenderness. There is no right CVA tenderness, left CVA tenderness, guarding or rebound.  Musculoskeletal:        General: Tenderness present.     Cervical back: Normal range of motion and neck supple. No tenderness.     Right lower leg: Edema present.     Left lower leg: Edema present.  Skin:    General: Skin is warm.     Capillary Refill: Capillary refill takes less than 2 seconds.     Findings: No erythema or rash.  Neurological:     General: No focal deficit present.     Mental Status: She is alert and oriented to person, place, and time.     Motor: No abnormal muscle tone.     Coordination: Coordination normal.     Deep Tendon Reflexes: Reflexes are normal and symmetric.  Psychiatric:        Mood and Affect: Mood normal.     ED Results / Procedures / Treatments   Labs (all labs ordered are listed, but only abnormal results are displayed) Labs Reviewed  CBC WITH DIFFERENTIAL/PLATELET - Abnormal; Notable for the following components:      Result Value   Lymphs Abs 0.4 (*)    All other components within normal limits  COMPREHENSIVE METABOLIC PANEL - Abnormal; Notable for the following components:   Chloride 94 (*)    Glucose, Bld 358 (*)    Calcium 8.4 (*)    Albumin 2.5 (*)    AST 47 (*)    ALT 50 (*)    All other components within normal limits  URINALYSIS, ROUTINE W REFLEX MICROSCOPIC - Abnormal; Notable for the following components:   Specific Gravity, Urine 1.041 (*)  Glucose, UA >=500 (*)    Hgb urine dipstick SMALL (*)    Ketones, ur 20 (*)    Protein, ur 100 (*)    Bacteria, UA RARE (*)    All other components within normal limits  HEMOGLOBIN A1C - Abnormal; Notable for the following components:   Hgb A1c MFr Bld 9.6 (*)    All other components within normal limits    CBC WITH DIFFERENTIAL/PLATELET - Abnormal; Notable for the following components:   Lymphs Abs 0.4 (*)    All other components within normal limits  COMPREHENSIVE METABOLIC PANEL - Abnormal; Notable for the following components:   Chloride 97 (*)    Glucose, Bld 300 (*)    Calcium 8.2 (*)    Albumin 2.4 (*)    AST 43 (*)    All other components within normal limits  C-REACTIVE PROTEIN - Abnormal; Notable for the following components:   CRP 17.0 (*)    All other components within normal limits  D-DIMER, QUANTITATIVE (NOT AT [facility]) - Abnormal; Notable for the following components:   D-Dimer, Quant 1.07 (*)    All other components within normal limits  PHOSPHORUS - Abnormal; Notable for the following components:   Phosphorus 2.1 (*)    All other components within normal limits  CBG MONITORING, ED - Abnormal; Notable for the following components:   Glucose-Capillary 296 (*)    All other components within normal limits  LIPASE, BLOOD  BRAIN NATRIURETIC PEPTIDE  FERRITIN  MAGNESIUM  HIV ANTIBODY (ROUTINE TESTING W REFLEX)  HEPATITIS PANEL, ACUTE  QUANTIFERON-TB GOLD PLUS  TROPONIN I (HIGH SENSITIVITY)  TROPONIN I (HIGH SENSITIVITY)    EKG None  Radiology CT Angio Chest PE W and/or Wo Contrast  Result Date: 05/03/2020 CLINICAL DATA:  Shortness of breath, PE suspected, breast cancer EXAM: CT ANGIOGRAPHY CHEST WITH CONTRAST TECHNIQUE: Multidetector CT imaging of the chest was performed using the standard protocol during bolus administration of intravenous contrast. Multiplanar CT image reconstructions and MIPs were obtained to evaluate the vascular anatomy. CONTRAST:  6m OMNIPAQUE IOHEXOL 350 MG/ML SOLN COMPARISON:  10/20/2019 FINDINGS: Cardiovascular: Satisfactory opacification of the pulmonary arteries to the segmental level. No evidence of pulmonary embolism. Normal heart size. No pericardial effusion. Right chest port catheter. Mediastinum/Nodes: Unchanged prominent mediastinal  lymph nodes. Thyroid gland, trachea, and esophagus demonstrate no significant findings. Lungs/Pleura: Extensive bilateral, somewhat geographic heterogeneous and ground-glass airspace opacity. Small pulmonary nodules in the lung apices seen on prior examination are mostly obscured by airspace disease, visualize nodules are however unchanged (series 8, image 24). No pleural effusion or pneumothorax. Upper Abdomen: No acute abnormality. Musculoskeletal: No chest wall abnormality. No acute or significant osseous findings. Review of the MIP images confirms the above findings. IMPRESSION: 1. Negative examination for pulmonary embolism. 2. Extensive bilateral, somewhat geographic heterogeneous and ground-glass airspace opacity, consistent with multifocal infection, including COVID-19 if clinically suspected. 3. Small pulmonary nodules in the lung apices seen on prior examination are mostly obscured by airspace disease, visualized nodules are however unchanged. Attention on follow-up. 4. Unchanged prominent mediastinal lymph nodes. Attention on follow-up. Electronically Signed   By: AEddie CandleM.D.   On: 05/03/2020 19:49   DG Chest Portable 1 View  Result Date: 05/03/2020 CLINICAL DATA:  Hypoxia COVID positive EXAM: PORTABLE CHEST 1 VIEW COMPARISON:  April 30, 2020 FINDINGS: The heart size and mediastinal contours are mildly enlarged. Aortic calcifications are seen. A right-sided MediPort catheter seen with the tip at the superior cavoatrial junction. Diffusely  increased interstitial markings are seen throughout both lungs. Patchy airspace opacity seen at the left lung base. IMPRESSION: Diffusely increased interstitial markings seen throughout both lungs likely consistent with atelectasis and/or infectious etiology. Electronically Signed   By: Prudencio Pair M.D.   On: 05/03/2020 15:31   VAS [place] LOWER EXTREMITY VENOUS (DVT) (ONLY [facility] & [facility])  Result Date: 05/04/2020  Lower Venous DVTStudy Indications: Swelling.   Limitations: Body habitus. Comparison Study: No prior studies. Performing Technologist: Darlin Coco  Examination Guidelines: A complete evaluation includes B-mode imaging, spectral Doppler, color Doppler, and power Doppler as needed of all accessible portions of each vessel. Bilateral testing is considered an integral part of a complete examination. Limited examinations for reoccurring indications may be performed as noted. The reflux portion of the exam is performed with the patient in reverse Trendelenburg.  +---------+---------------+---------+-----------+----------+---------------+ RIGHT    CompressibilityPhasicitySpontaneityPropertiesThrombus Aging  +---------+---------------+---------+-----------+----------+---------------+ CFV      Full           Yes      Yes                                  +---------+---------------+---------+-----------+----------+---------------+ SFJ      Full                                                         +---------+---------------+---------+-----------+----------+---------------+ FV Prox  Full                                                         +---------+---------------+---------+-----------+----------+---------------+ FV Mid   Full                                                         +---------+---------------+---------+-----------+----------+---------------+ FV DistalFull                                                         +---------+---------------+---------+-----------+----------+---------------+ PFV      Full                                                         +---------+---------------+---------+-----------+----------+---------------+ POP      Full           Yes      Yes                                  +---------+---------------+---------+-----------+----------+---------------+ PTV  Patent by color  +---------+---------------+---------+-----------+----------+---------------+ PERO                                                  Patent by color +---------+---------------+---------+-----------+----------+---------------+   +---------+---------------+---------+-----------+----------+---------------+ LEFT     CompressibilityPhasicitySpontaneityPropertiesThrombus Aging  +---------+---------------+---------+-----------+----------+---------------+ CFV      Full           Yes      Yes                                  +---------+---------------+---------+-----------+----------+---------------+ SFJ      Full                                                         +---------+---------------+---------+-----------+----------+---------------+ FV Prox  Full                                                         +---------+---------------+---------+-----------+----------+---------------+ FV Mid   Full                                                         +---------+---------------+---------+-----------+----------+---------------+ FV DistalFull                                                         +---------+---------------+---------+-----------+----------+---------------+ PFV      Full                                                         +---------+---------------+---------+-----------+----------+---------------+ POP      Full           Yes      Yes                                  +---------+---------------+---------+-----------+----------+---------------+ PTV                                                   Patent by color +---------+---------------+---------+-----------+----------+---------------+ PERO  Patent by color +---------+---------------+---------+-----------+----------+---------------+     Summary: RIGHT: - There is no evidence of deep vein thrombosis in the lower extremity. However,  portions of this examination were limited- see technologist comments above.  - No cystic structure found in the popliteal fossa.  LEFT: - There is no evidence of deep vein thrombosis in the lower extremity. However, portions of this examination were limited- see technologist comments above.  - No cystic structure found in the popliteal fossa.  *See table(s) above for measurements and observations. Electronically signed by Servando Snare MD on 05/04/2020 at 9:22:27 AM.    Final     Procedures Procedures (including critical care time)  Medications Ordered in ED Medications  sodium chloride flush (NS) 0.9 % injection 10-40 mL (10 mLs Intracatheter Not Given 05/03/20 2118)  sodium chloride flush (NS) 0.9 % injection 10-40 mL (has no administration in time range)  Chlorhexidine Gluconate Cloth 2 % PADS 6 each (6 each Topical Not Given 05/03/20 1724)  letrozole [facility]) tablet 2.5 mg (has no administration in time range)  albuterol (VENTOLIN HFA) 108 (90 Base) MCG/ACT inhaler 2 puff (has no administration in time range)  enoxaparin (LOVENOX) injection 75 mg (75 mg Subcutaneous Given 05/04/20 0329)  sodium chloride flush (NS) 0.9 % injection 3 mL (3 mLs Intravenous Given 05/04/20 0336)  sodium chloride flush (NS) 0.9 % injection 3 mL (has no administration in time range)  0.9 %  sodium chloride infusion (has no administration in time range)  dexamethasone (DECADRON) injection 6 mg (6 mg Intravenous Given 05/04/20 0329)  polyethylene glycol (MIRALAX / GLYCOLAX) packet 17 g (has no administration in time range)  insulin aspart (novoLOG) injection 0-9 Units (5 Units Subcutaneous Given 05/04/20 0808)  linagliptin (TRADJENTA) tablet 5 mg (has no administration in time range)  remdesivir 200 mg in sodium chloride 0.9% 250 mL IVPB (0 mg Intravenous Stopped 05/04/20 0225)    Followed by  remdesivir 100 mg in sodium chloride 0.9 % 100 mL IVPB (has no administration in time range)  hydrOXYzine (ATARAX/VISTARIL) tablet  25 mg (has no administration in time range)  gabapentin (NEURONTIN) capsule 300 mg (has no administration in time range)  acetaminophen (TYLENOL) tablet 650 mg (has no administration in time range)  hydrOXYzine (ATARAX/VISTARIL) tablet 25 mg (has no administration in time range)  insulin glargine (LANTUS) injection 14 Units (has no administration in time range)  baricitinib (OLUMIANT) tablet 4 mg (has no administration in time range)  iohexol (OMNIPAQUE) 350 MG/ML injection 75 mL (75 mLs Intravenous Contrast Given 05/03/20 1943)    ED Course  I have reviewed the triage vital signs and the nursing notes.  Pertinent labs & imaging results that were available during my care of the patient were reviewed by me and considered in my medical decision making (see chart for details).    MDM Rules/Calculators/A&P                          EMYA PICADO is a 69 y.o. female with a past medical history significant for diabetes, hypertension, metastatic breast cancer on chemotherapy, verbal report of prior "blood clots", COPD on 3 L home oxygen, and recent diagnosis of COVID-19 who presents with oxygen saturation of 60% at home with respiratory distress and lightheadedness.  Patient reports that she was diagnosed with COVID-19 several days ago but wanted to go home.  She reports that she was maintain her oxygen saturations on her home 3 L but today,  her breathing was worsened and she checked her oxygen saturation and was 62%.  She reports he felt she was going to die and passed out and had pleuritic and exertional shortness of breath and chest pain.  She reports that she felt she could not take a deep breath.  She reports her legs been more edematous recently and have been red and warm.  She reports that she has had a history of blood clot in the past but does not remember when.  She denies vomiting reports a mild nausea.  She reports has been having some diarrhea at times.  She denies any abdominal pain or  syncope.  On exam, lungs have some rales and rhonchi.  Chest is tender to palpation.  Mild murmur.  Legs are edematous and tender.  Good pulses in extremities.  Given the patient's new significant hypoxia, I am fairly concerned that this is related to COVID-19 in her known COPD however, with the reported history of blood clots with no evidence of anticoagulation and her pleuritic chest discomfort and shortness of breath, we will order a PE study to rule out pulmonary embolism as well as ultrasound of her legs to rule out DVT.  The swelling is somewhat symmetric so CHF may also be involved.  We will get a BNP.  Due to her hypoxia despite home oxygen, I do anticipate she will likely require admission for further management of hypoxia in the setting of COVID-19 if all other work-up is reassuring.  Care transferred to oncoming team while awaiting workup to return before likely admission for hypoxia   Final Clinical Impression(s) / ED Diagnoses Final diagnoses:  COVID-19  Hypoxia    Rx / DC Orders ED Discharge Orders    None      Clinical Impression: 1. COVID-19   2. Hypoxia     Disposition: Admit  This note was prepared with assistance of Dragon voice recognition software. Occasional wrong-word or sound-a-like substitutions may have occurred due to the inherent limitations of voice recognition software.     Jordan Pardini, Gwenyth Allegra, MD 05/04/20 1044

## 2020-05-03 NOTE — ED Provider Notes (Signed)
Patient had CTA neg for PE, but w findings c/w COVID.  She is on 4L [place].  I've discussed her presentation with her care team for admission.   Carmin Muskrat, MD 05/03/20 2022

## 2020-05-04 DIAGNOSIS — U071 COVID-19: Principal | ICD-10-CM

## 2020-05-04 LAB — MAGNESIUM: Magnesium: 1.7 mg/dL (ref 1.7–2.4)

## 2020-05-04 LAB — COMPREHENSIVE METABOLIC PANEL
ALT: 44 U/L (ref 0–44)
AST: 43 U/L — ABNORMAL HIGH (ref 15–41)
Albumin: 2.4 g/dL — ABNORMAL LOW (ref 3.5–5.0)
Alkaline Phosphatase: 103 U/L (ref 38–126)
Anion gap: 9 (ref 5–15)
BUN: 14 mg/dL (ref 8–23)
CO2: 31 mmol/L (ref 22–32)
Calcium: 8.2 mg/dL — ABNORMAL LOW (ref 8.9–10.3)
Chloride: 97 mmol/L — ABNORMAL LOW (ref 98–111)
Creatinine, Ser: 0.63 mg/dL (ref 0.44–1.00)
GFR calc Af Amer: >60 mL/min (ref >60)
GFR calc non Af Amer: >60 mL/min (ref >60)
Glucose, Bld: 300 mg/dL — ABNORMAL HIGH (ref 70–99)
Potassium: 4.2 mmol/L (ref 3.5–5.1)
Sodium: 137 mmol/L (ref 135–145)
Total Bilirubin: 0.6 mg/dL (ref 0.3–1.2)
Total Protein: 6.8 g/dL (ref 6.5–8.1)

## 2020-05-04 LAB — D-DIMER, QUANTITATIVE: D-Dimer, Quant: 1.07 ug/mL-FEU — ABNORMAL HIGH (ref 0.00–0.50)

## 2020-05-04 LAB — CBC WITH DIFFERENTIAL/PLATELET
Abs Immature Granulocytes: 0.04 10*3/uL (ref 0.00–0.07)
Basophils Absolute: 0 10*3/uL (ref 0.0–0.1)
Basophils Relative: 0 %
Eosinophils Absolute: 0 10*3/uL (ref 0.0–0.5)
Eosinophils Relative: 0 %
HCT: 42.6 % (ref 36.0–46.0)
Hemoglobin: 13.1 g/dL (ref 12.0–15.0)
Immature Granulocytes: 1 %
Lymphocytes Relative: 8 %
Lymphs Abs: 0.4 10*3/uL — ABNORMAL LOW (ref 0.7–4.0)
MCH: 27.3 pg (ref 26.0–34.0)
MCHC: 30.8 g/dL (ref 30.0–36.0)
MCV: 88.8 fL (ref 80.0–100.0)
Monocytes Absolute: 0.3 10*3/uL (ref 0.1–1.0)
Monocytes Relative: 6 %
Neutro Abs: 4.1 10*3/uL (ref 1.7–7.7)
Neutrophils Relative %: 85 %
Platelets: 202 10*3/uL (ref 150–400)
RBC: 4.8 MIL/uL (ref 3.87–5.11)
RDW: 14.2 % (ref 11.5–15.5)
WBC: 4.8 10*3/uL (ref 4.0–10.5)
nRBC: 0 % (ref 0.0–0.2)

## 2020-05-04 LAB — HEPATITIS PANEL, ACUTE
HCV Ab: NONREACTIVE
Hep A IgM: NONREACTIVE
Hep B C IgM: NONREACTIVE
Hepatitis B Surface Ag: NONREACTIVE

## 2020-05-04 LAB — PHOSPHORUS: Phosphorus: 2.1 mg/dL — ABNORMAL LOW (ref 2.5–4.6)

## 2020-05-04 LAB — HEMOGLOBIN A1C
Hgb A1c MFr Bld: 9.6 % — ABNORMAL HIGH (ref 4.8–5.6)
Mean Plasma Glucose: 228.82 mg/dL

## 2020-05-04 LAB — CBG MONITORING, ED
Glucose-Capillary: 296 mg/dL — ABNORMAL HIGH (ref 70–99)
Glucose-Capillary: 359 mg/dL — ABNORMAL HIGH (ref 70–99)
Glucose-Capillary: 325 mg/dL — ABNORMAL HIGH (ref 70–99)

## 2020-05-04 LAB — HIV ANTIBODY (ROUTINE TESTING W REFLEX): HIV Screen 4th Generation wRfx: NONREACTIVE

## 2020-05-04 LAB — C-REACTIVE PROTEIN: CRP: 17 mg/dL — ABNORMAL HIGH (ref <1.0)

## 2020-05-04 LAB — FERRITIN: Ferritin: 165 ng/mL (ref 11–307)

## 2020-05-04 MED ORDER — ACETAMINOPHEN 325 MG PO TABS
650.0000 mg | ORAL_TABLET | Freq: Four times a day (QID) | ORAL | Status: DC | PRN
  Administered 2020-05-04: 650 mg via ORAL
  Filled 2020-05-04: qty 2

## 2020-05-04 MED ORDER — SODIUM CHLORIDE 0.9 % IV SOLN
100.0000 mg | Freq: Every day | INTRAVENOUS | Status: AC
  Administered 2020-05-05 – 2020-05-08 (×4): 100 mg via INTRAVENOUS
  Filled 2020-05-04 (×5): qty 20

## 2020-05-04 MED ORDER — SODIUM CHLORIDE 0.9% FLUSH: 3.0000 mL | INTRAVENOUS | Status: DC | PRN

## 2020-05-04 MED ORDER — HYDROXYZINE HCL 25 MG PO TABS
25.0000 mg | ORAL_TABLET | Freq: Three times a day (TID) | ORAL | Status: DC
  Administered 2020-05-04 – 2020-05-07 (×9): 25 mg via ORAL
  Filled 2020-05-04 (×9): qty 1

## 2020-05-04 MED ORDER — DEXAMETHASONE SODIUM PHOSPHATE 10 MG/ML IJ SOLN
6.0000 mg | INTRAMUSCULAR | Status: DC
  Administered 2020-05-04 – 2020-05-08 (×5): 6 mg via INTRAVENOUS
  Filled 2020-05-04 (×5): qty 1

## 2020-05-04 MED ORDER — HYDROXYZINE HCL 25 MG PO TABS
25.0000 mg | ORAL_TABLET | Freq: Once | ORAL | Status: AC
  Administered 2020-05-04: 25 mg via ORAL
  Filled 2020-05-04: qty 1

## 2020-05-04 MED ORDER — LETROZOLE 2.5 MG PO TABS
2.5000 mg | ORAL_TABLET | Freq: Every day | ORAL | Status: DC
  Administered 2020-05-04 – 2020-05-10 (×7): 2.5 mg via ORAL
  Filled 2020-05-04 (×7): qty 1

## 2020-05-04 MED ORDER — LINAGLIPTIN 5 MG PO TABS
5.0000 mg | ORAL_TABLET | Freq: Every day | ORAL | Status: DC
  Administered 2020-05-05 – 2020-05-06 (×2): 5 mg via ORAL
  Filled 2020-05-04 (×4): qty 1

## 2020-05-04 MED ORDER — SODIUM CHLORIDE 0.9 % IV SOLN: 250.0000 mL | INTRAVENOUS | Status: DC | PRN

## 2020-05-04 MED ORDER — INSULIN ASPART 100 UNIT/ML ~~LOC~~ SOLN
0.0000 [IU] | Freq: Three times a day (TID) | SUBCUTANEOUS | Status: DC
  Administered 2020-05-04: 9 [IU] via SUBCUTANEOUS
  Administered 2020-05-04: 5 [IU] via SUBCUTANEOUS
  Administered 2020-05-04: 7 [IU] via SUBCUTANEOUS
  Administered 2020-05-05: 9 [IU] via SUBCUTANEOUS
  Administered 2020-05-05: 5 [IU] via SUBCUTANEOUS

## 2020-05-04 MED ORDER — BARICITINIB 2 MG PO TABS
4.0000 mg | ORAL_TABLET | Freq: Every day | ORAL | Status: DC
  Administered 2020-05-04 – 2020-05-10 (×7): 4 mg via ORAL
  Filled 2020-05-04 (×7): qty 2

## 2020-05-04 MED ORDER — GABAPENTIN 300 MG PO CAPS: 300.0000 mg | ORAL_CAPSULE | Freq: Every day | ORAL | Status: DC | PRN

## 2020-05-04 MED ORDER — KETOROLAC TROMETHAMINE 30 MG/ML IJ SOLN: 30.0000 mg | Freq: Once | INTRAMUSCULAR | Status: DC

## 2020-05-04 MED ORDER — ALBUTEROL SULFATE HFA 108 (90 BASE) MCG/ACT IN AERS
2.0000 | INHALATION_SPRAY | Freq: Four times a day (QID) | RESPIRATORY_TRACT | Status: DC | PRN
  Administered 2020-05-05 (×4): 2 via RESPIRATORY_TRACT
  Filled 2020-05-04: qty 6.7

## 2020-05-04 MED ORDER — ENOXAPARIN SODIUM 80 MG/0.8ML ~~LOC~~ SOLN
75.0000 mg | Freq: Every day | SUBCUTANEOUS | Status: DC
  Administered 2020-05-04 – 2020-05-10 (×7): 75 mg via SUBCUTANEOUS
  Filled 2020-05-04 (×2): qty 0.8
  Filled 2020-05-04 (×2): qty 0.75
  Filled 2020-05-04 (×4): qty 0.8

## 2020-05-04 MED ORDER — POLYETHYLENE GLYCOL 3350 17 G PO PACK: 17.0000 g | PACK | Freq: Every day | ORAL | Status: DC | PRN

## 2020-05-04 MED ORDER — KETOROLAC TROMETHAMINE 30 MG/ML IJ SOLN
30.0000 mg | Freq: Once | INTRAMUSCULAR | Status: AC | PRN
  Administered 2020-05-05: 30 mg via INTRAVENOUS
  Filled 2020-05-04: qty 1

## 2020-05-04 MED ORDER — GABAPENTIN 300 MG PO CAPS: 300.0000 mg | ORAL_CAPSULE | Freq: Two times a day (BID) | ORAL | Status: DC

## 2020-05-04 MED ORDER — GABAPENTIN 300 MG PO CAPS
300.0000 mg | ORAL_CAPSULE | Freq: Two times a day (BID) | ORAL | Status: DC
  Administered 2020-05-05 – 2020-05-10 (×11): 300 mg via ORAL
  Filled 2020-05-04 (×12): qty 1

## 2020-05-04 MED ORDER — HYDROXYZINE HCL 25 MG PO TABS: 25.0000 mg | ORAL_TABLET | ORAL | Status: DC | PRN

## 2020-05-04 MED ORDER — INSULIN GLARGINE 100 UNIT/ML ~~LOC~~ SOLN
14.0000 [IU] | Freq: Every day | SUBCUTANEOUS | Status: DC
  Administered 2020-05-04 – 2020-05-05 (×2): 14 [IU] via SUBCUTANEOUS
  Filled 2020-05-04 (×2): qty 0.14

## 2020-05-04 MED ORDER — SODIUM CHLORIDE 0.9% FLUSH
3.0000 mL | Freq: Two times a day (BID) | INTRAVENOUS | Status: DC
  Administered 2020-05-04 – 2020-05-08 (×7): 3 mL via INTRAVENOUS

## 2020-05-04 MED ORDER — SODIUM CHLORIDE 0.9 % IV SOLN: 200.0000 mg | Freq: Once | INTRAVENOUS | Status: DC

## 2020-05-04 MED ORDER — SODIUM CHLORIDE 0.9 % IV SOLN: 100.0000 mg | Freq: Every day | INTRAVENOUS | Status: DC

## 2020-05-04 MED ORDER — ACETAMINOPHEN 325 MG PO TABS
650.0000 mg | ORAL_TABLET | Freq: Four times a day (QID) | ORAL | Status: DC
  Administered 2020-05-04 – 2020-05-10 (×24): 650 mg via ORAL
  Filled 2020-05-04 (×24): qty 2

## 2020-05-04 MED ORDER — SODIUM CHLORIDE 0.9 % IV SOLN: INTRAVENOUS | Status: DC

## 2020-05-04 NOTE — ED Notes (Signed)
Pt taken off NRB and placed on 6LPM [place]. Spo2 92% at this time. Will continue to monitor.

## 2020-05-04 NOTE — ED Notes (Addendum)
Pt switched to non-rebreather d/t O2 dropping into 80s and not improving on 6L [place]- MD made aware

## 2020-05-04 NOTE — Progress Notes (Signed)
PCP Visit  [name] seems stable able to talk in full sentence on 6 l but she has a number of comorbidities that make her prognosis concerning.  Has chronic back pain and obesity which make positioning very difficult for her.    She wished to be full code for respiratory illnesses but does not think she would want resuscitation if her cancer progresses.  Really appreciate the care of our inpt team.  Azzie Roup MD

## 2020-05-04 NOTE — Progress Notes (Signed)
Inpatient Diabetes Program Recommendations  AACE/ADA: New Consensus Statement on Inpatient Glycemic Control (2015)  Target Ranges:  Prepandial:   less than 140 mg/dL      Peak postprandial:   less than 180 mg/dL (1-2 hours)      Critically ill patients:  140 - 180 mg/dL   Lab Results  Component Value Date   GLUCAP 296 (H) 05/04/2020   HGBA1C 9.6 (H) 05/04/2020    Review of Glycemic Control Results for [name], [name] (MRN [id number]) as of 05/04/2020 11:43  Ref. Range 04/30/2020 14:59 05/04/2020 08:05  Glucose-Capillary Latest Ref Range: 70 - 99 mg/dL 262 (H) 296 (H)   Diabetes history: DM 2 Outpatient Diabetes medications: Humalog 10 units tid with meals, Lantus 28 units q HS- Prednisone taper Current orders for Inpatient glycemic control:  Decadron 6 mg daily Novolog sensitive tid with meals Tradjenta 5 mg daily Lantus 14 units daily Inpatient Diabetes Program Recommendations:   Blood sugars>goal.  Consider increasing Lantus to 30 units daily and add Novolog meal coverage 8 units tid with meals (hold if patient eats less than 50%).   Thanks  Adah Perl, RN, BC-ADM Inpatient Diabetes Coordinator Pager [phone] (8a-5p)

## 2020-05-04 NOTE — Progress Notes (Signed)
Pt is up to stand and noted her system struggling with the effort.  Pt is expecting to go to rehab for follow up and with her LE strength, is making the strain of moving greater on her spine.  Follow acutely to strengthen legs, increase standing core control, monitor effortful movement with her vitals and plan for rehab admission with endurance training.  05/04/20 1700  PT Visit Information  Last PT Received On 05/04/20  Assistance Needed +1  History of Present Illness 69 yo female with onset of increased LBP and discovery of pulm nodules was admitted for care.  Has positive Covid 19 and hypoxia, with acute respiratory symptoms since 04/23/20.  PMHx:  metastatic breast CA on chemo, COPD, O2 use, DM, HTN, morbid obesity, chronic hypoxic resp failure,   Precautions  Precautions Fall  Precaution Comments monitor sats and pulses  Restrictions  Weight Bearing Restrictions No  Other Position/Activity Restrictions has difficulty with BLE tightness and pain in lower legs  Home Living  Family/patient expects to be discharged to: Private residence  Living Arrangements Children  Available Help at Discharge Family;Available 24 hours/day  Type of [place] One level  Bathroom Shower/Tub Tub/shower unit  [place] - 4 wheels;Shower seat  Additional Comments pt has modifications in her home but does not tolerate getting legs into bathtub  Prior Function  Level of Independence Independent with assistive device(s);Needs assistance  Gait / Transfers Assistance Needed rollator for 23 steps max so far  ADL's / Homemaking Assistance Needed has family help to dress and bathe  Communication  Communication No difficulties  Pain Assessment  Pain Assessment 0-10  Pain Score 10  Pain Location B legs in standing  Pain Descriptors / Indicators Grimacing;Burning  Pain Intervention(s) Limited activity within patient's  tolerance;Monitored during session;Premedicated before session;Repositioned  Cognition  Arousal/Alertness Awake/alert  Behavior During Therapy Flat affect  Overall Cognitive Status Within Functional Limits for tasks assessed  General Comments pt is down about her limitations  Upper Extremity Assessment  Upper Extremity Assessment Overall WFL for tasks assessed  Lower Extremity Assessment  Lower Extremity Assessment Generalized weakness  Cervical / Trunk Assessment  Cervical / Trunk Assessment Other exceptions (chronic back pain)  Bed Mobility  General bed mobility comments up in chair when PT arrived  Transfers  Overall transfer level Needs assistance  Equipment used 1 person hand held assist  Transfers Sit to/from Stand  Sit to Stand Min guard  General transfer comment pt is able to stand but is in some discomfort over LE edema and LBP  Ambulation/Gait  General Gait Details unable to tolerate  Balance  Overall balance assessment Needs assistance  Sitting balance-Leahy Scale Good  Standing balance-Leahy Scale Fair  General Comments  General comments (skin integrity, edema, etc.) Pt was seen for progressing to stand and monitor her vitals.  Desaturated to 90% to stand from 100% on O2, pulses up to 135  Exercises  Exercises Other exercises (R knee 4+ ext, 3+ flex adn L knee 3 ext, 4+ flex)  PT - End of Session  Equipment Utilized During Treatment Gait belt;Oxygen  Activity Tolerance Patient limited by fatigue;Treatment limited secondary to medical complications (Comment)  Patient left in chair;with call bell/phone within reach  Nurse Communication Mobility status;Other (comment) (discussed her need to sleep in a recliner for years)  PT Assessment  PT Recommendation/Assessment Patient needs continued PT services  PT Visit Diagnosis Unsteadiness on  feet (R26.81);Muscle weakness (generalized) (M62.81);Difficulty in walking, not elsewhere classified (R26.2);Other (comment) (O2 sat  declines with gait)  PT Problem List Decreased strength;Decreased range of motion;Decreased activity tolerance;Decreased mobility;Decreased balance;Decreased coordination;Decreased cognition;Decreased knowledge of use of DME;Decreased safety awareness;Cardiopulmonary status limiting activity;Pain;Decreased skin integrity  Barriers to Discharge Inaccessible home environment;Decreased caregiver support  Barriers to Discharge Comments home with limited help and cannot walk  PT Plan  PT Frequency (ACUTE ONLY) Min 3X/week  PT Treatment/Interventions (ACUTE ONLY) DME instruction;Gait training;Stair training;Functional mobility training;Therapeutic activities;Therapeutic exercise;Balance training;Neuromuscular re-education;Patient/family education  AM-PAC PT "6 Clicks" Mobility Outcome Measure (Version 2)  Help needed turning from your back to your side while in a flat bed without using bedrails? 4  Help needed moving from lying on your back to sitting on the side of a flat bed without using bedrails? 3  Help needed moving to and from a bed to a chair (including a wheelchair)? 3  Help needed standing up from a chair using your arms (e.g., wheelchair or bedside chair)? 3  Help needed to walk in hospital room? 3  Help needed climbing 3-5 steps with a railing?  2  6 Click Score 18  Consider Recommendation of Discharge To: Home with [facility]  PT Recommendation  Recommendations for Other Services Rehab consult  Follow Up Recommendations CIR  PT equipment None recommended by PT  Individuals Consulted  Consulted and Agree with Results and Recommendations Patient  Acute Rehab PT Goals  Patient Stated Goal to get stronger and walk farther, relieve back pain  PT Goal Formulation With patient  Time For Goal Achievement 05/11/20  Potential to Achieve Goals Good  PT Time Calculation  PT Start Time (ACUTE ONLY) 1328  PT Stop Time (ACUTE ONLY) 1357  PT Time Calculation (min) (ACUTE ONLY) 29 min  PT General  Charges  $$ ACUTE PT VISIT 1 Visit  PT Evaluation  $PT Eval Moderate Complexity 1 Mod  PT Treatments  $Therapeutic Activity 8-22 mins  Written Expression  Dominant Hand Right    Mee Hives, PT MS Acute Rehab Dept. Number: [place]

## 2020-05-04 NOTE — Progress Notes (Addendum)
Family Medicine Teaching Service Daily Progress Note Intern Pager: [phone]  Patient name: [name] Medical record number: [id number] Date of birth: 1951-07-26 Age: 69 y.o. Gender: female  Primary Care Provider: [name], MD Consultants: None Code Status: Full  Pt Overview and Major Events to Date:  Admitted on 9/16  Assessment and Plan:   MARIYAM REMINGTON is a 69 y.o. female presenting with worsening shortness of breath and COVID-19 . PMH is significant for T2DM, HTN, metastatic breast cancer on chemo, COPD on 3L, recent COVID diagnosis.   Shortness of breath 2/2 COVID pneumonia Able to speak in complete sentences. On 6 L nasal canula with O2 sat at 93%. -Tylenol 650mg  q6h PRN -Remdesivir 200mg  x1 followed by 100mg  daily x4 days -Dexamethasone 6mg  x10 days -Supplemental O2 as needed for O2 goal 90-94%   T2DM A1C 9.6; CBG 296, patient has been unable to tolerate much PO. Home medications include Lantus 28U QHS, Humalog 10U TID with meals.  -sSSI -CBGs TID with meals and QHS -Start Lantus 14 QHS -Linagliptin 5mg  daily -Continue to monitor and adjust medications as necessary   COPD Patient has COPD with baseline 3L O2 at home. Home meds also include albuterol. -Continue albuterol inhaler   Anxiety: Currently close to tears. Long standing issue with anxiety. Have started Vistaril. -Gave one dose Vistaril 25 mg -Start Vistaril 25 mg PRN q4   HTN Not taking any medications currently. Patient was previously on amlodipine but is no longer taking it. Last visit with PCP in March 2021 recommended possible ARB. -Continue to monitor -If needed start ARB   Malignant breast cancer Patient is currently followed by oncology. Home meds include letrozole 2.5mg  daily. -Continue home letrozole if on formulary   Osteoarthritis of spine Patient has chronic low back pain with possible neuropathic symptoms of burning/stinging. Home medications include Gabapentin  reportedly being taken at home as 300mg  PRN for pain, hold at the moment unless patient complains of pain and then consider restarting.  -Restart Gabapentin 300 mg daily PRN -K pad ordered   FEN/GI: Carb mod/heart healthy PPx: Lovenox  Disposition: Med-Surg  Prior to Admission Living Arrangement: Home Anticipated Discharge Location: Home Barriers to Discharge: Oxygen Requirements Anticipated discharge in approximately 2-5 day(s).   Subjective:  Interviewed patient bedside in the ED.  She reports her breathing is ok at this time. She reports severe anxiety due to feeling overwhelmed due to her multiple health issue, being in the ED for a long time and unable to be comfortable due to her chronic back issues. She states that she cannot lie in the ER stretcher and that the recliner chairs in the ED are too uncomfortable. She states she can only sleep about an hour before having to stand up and so only had fractured sleep last night. Talked with her for approximately 30 minutes on her back pain and about her daughter who passed away due to Aplastic Anemia. Discussed possible medications or  outpatient therapy after discharge. Then discussed attempting to get a bed in the hospital as soon as possible. Discussed would get her medication to help with her anxiety.   Objective: Temp:  [97.9 F (36.6 C)-99.8 F (37.7 C)] 97.9 F (36.6 C) (09/17 0351) Pulse Rate:  [48-100] 67 (09/17 0634) Resp:  [15-22] 22 (09/17 0634) BP: (127-174)/(59-79) 172/72 (09/17 0634) SpO2:  [79 %-96 %] 95 % (09/17 0634) Weight:  [147.4 kg] 147.4 kg (09/16 1018) Physical Exam:  Physical Exam Vitals and nursing note reviewed.  Constitutional:      General: She is in acute distress.     Appearance: She is obese. She is not ill-appearing, toxic-appearing or diaphoretic.  HENT:     Head: Atraumatic.  Cardiovascular:     Rate and Rhythm: Normal rate and regular rhythm.     Pulses: Normal pulses.          Radial  pulses are 2+ on the right side and 2+ on the left side.       Dorsalis pedis pulses are 2+ on the right side and 2+ on the left side.     Heart sounds: Normal heart sounds, S1 normal and S2 normal. No murmur heard.   Pulmonary:     Effort: Tachypnea present. No respiratory distress.     Breath sounds: Wheezing (mild scattered) present.  Musculoskeletal:     Right lower leg: Edema present.     Left lower leg: Edema present.  Neurological:     Mental Status: She is alert. Mental status is at baseline.  Psychiatric:        Attention and Perception: Attention normal.        Mood and Affect: Mood is anxious. Affect is tearful.        Speech: Speech normal.        Behavior: Behavior normal.      Laboratory: Recent Labs  Lab 04/30/20 1611 05/03/20 1622 05/04/20 0610  WBC 3.1* 4.7 4.8  HGB 14.7 12.9 13.1  HCT 47.9* 42.6 42.6  PLT 164 201 202   Recent Labs  Lab 04/30/20 1611 05/03/20 1622  NA 139 137  K 4.3 4.7  CL 94* 94*  CO2 34* 32  BUN 14 13  CREATININE 0.65 0.70  CALCIUM 8.9 8.4*  PROT 7.5 6.6  BILITOT 0.3 0.4  ALKPHOS 123 112  ALT 56* 50*  AST 54* 47*  GLUCOSE 246* 358*    D-Dimer: 1.07 (High) CRP: 17.0 (High) Ferritin: 165 (WNL) A1C: 9.6 (High) Mag: 1.7 (WNL) Phos: 2.1 (High)   Imaging/Diagnostic Tests:   CT Angio Chest PE W and/or WO Contrast: Negative examination for pulmonary embolism. Extensive bilateral, somewhat geographic heterogeneous and ground-glass airspace opacity, consistent with multifocal infection, including COVID-19 if clinically suspected. Small pulmonary nodules in the lung apices seen on prior examination are mostly obscured by airspace disease, visualized nodules are however unchanged. Attention on follow-up. Unchanged prominent mediastinal lymph nodes. Attention on follow-up.   Vas [place] Lower Extremity Venous:  RIGHT:  - There is no evidence of deep vein thrombosis in the lower extremity.  However, portions of this examination  were limited- see technologist  comments above.  - No cystic structure found in the popliteal fossa.    LEFT:  - There is no evidence of deep vein thrombosis in the lower extremity.  However, portions of this examination were limited- see technologist  comments above.  - No cystic structure found in the popliteal fossa.    DG Chest Portable 1 View: Diffusely increased interstitial markings seen throughout both lungs likely consistent with atelectasis and/or infectious etiology.   Briant Cedar, MD 05/04/2020, 6:58 AM PGY-1, [place] Intern pager: [phone], text pages welcome

## 2020-05-04 NOTE — ED Notes (Signed)
Assisted pt from recliner into bed

## 2020-05-05 DIAGNOSIS — R0902 Hypoxemia: Secondary | ICD-10-CM

## 2020-05-05 LAB — COMPREHENSIVE METABOLIC PANEL
ALT: 42 U/L (ref 0–44)
AST: 30 U/L (ref 15–41)
Albumin: 2.2 g/dL — ABNORMAL LOW (ref 3.5–5.0)
Alkaline Phosphatase: 97 U/L (ref 38–126)
Anion gap: 8 (ref 5–15)
BUN: 18 mg/dL (ref 8–23)
CO2: 33 mmol/L — ABNORMAL HIGH (ref 22–32)
Calcium: 8.5 mg/dL — ABNORMAL LOW (ref 8.9–10.3)
Chloride: 96 mmol/L — ABNORMAL LOW (ref 98–111)
Creatinine, Ser: 0.68 mg/dL (ref 0.44–1.00)
GFR calc Af Amer: >60 mL/min (ref >60)
GFR calc non Af Amer: >60 mL/min (ref >60)
Glucose, Bld: 310 mg/dL — ABNORMAL HIGH (ref 70–99)
Potassium: 4.3 mmol/L (ref 3.5–5.1)
Sodium: 137 mmol/L (ref 135–145)
Total Bilirubin: 0.3 mg/dL (ref 0.3–1.2)
Total Protein: 6.4 g/dL — ABNORMAL LOW (ref 6.5–8.1)

## 2020-05-05 LAB — CBC WITH DIFFERENTIAL/PLATELET
Abs Immature Granulocytes: 0.02 10*3/uL (ref 0.00–0.07)
Basophils Absolute: 0 10*3/uL (ref 0.0–0.1)
Basophils Relative: 0 %
Eosinophils Absolute: 0 10*3/uL (ref 0.0–0.5)
Eosinophils Relative: 0 %
HCT: 41.7 % (ref 36.0–46.0)
Hemoglobin: 12.9 g/dL (ref 12.0–15.0)
Immature Granulocytes: 1 %
Lymphocytes Relative: 20 %
Lymphs Abs: 0.6 10*3/uL — ABNORMAL LOW (ref 0.7–4.0)
MCH: 27.4 pg (ref 26.0–34.0)
MCHC: 30.9 g/dL (ref 30.0–36.0)
MCV: 88.7 fL (ref 80.0–100.0)
Monocytes Absolute: 0.2 10*3/uL (ref 0.1–1.0)
Monocytes Relative: 8 %
Neutro Abs: 2.1 10*3/uL (ref 1.7–7.7)
Neutrophils Relative %: 71 %
Platelets: 236 10*3/uL (ref 150–400)
RBC: 4.7 MIL/uL (ref 3.87–5.11)
RDW: 13.8 % (ref 11.5–15.5)
WBC: 2.9 10*3/uL — ABNORMAL LOW (ref 4.0–10.5)
nRBC: 0 % (ref 0.0–0.2)

## 2020-05-05 LAB — GLUCOSE, CAPILLARY
Glucose-Capillary: 259 mg/dL — ABNORMAL HIGH (ref 70–99)
Glucose-Capillary: 392 mg/dL — ABNORMAL HIGH (ref 70–99)
Glucose-Capillary: 414 mg/dL — ABNORMAL HIGH (ref 70–99)
Glucose-Capillary: 357 mg/dL — ABNORMAL HIGH (ref 70–99)
Glucose-Capillary: 348 mg/dL — ABNORMAL HIGH (ref 70–99)

## 2020-05-05 LAB — C-REACTIVE PROTEIN: CRP: 15.9 mg/dL — ABNORMAL HIGH (ref <1.0)

## 2020-05-05 LAB — CULTURE, BLOOD (ROUTINE X 2): Culture: NO GROWTH

## 2020-05-05 LAB — MRSA PCR SCREENING: MRSA by PCR: POSITIVE — AB

## 2020-05-05 LAB — D-DIMER, QUANTITATIVE: D-Dimer, Quant: 1.07 ug/mL-FEU — ABNORMAL HIGH (ref 0.00–0.50)

## 2020-05-05 MED ORDER — MUPIROCIN 2 % EX OINT
1.0000 "application " | TOPICAL_OINTMENT | Freq: Two times a day (BID) | CUTANEOUS | Status: AC
  Administered 2020-05-05 – 2020-05-09 (×10): 1 via NASAL
  Filled 2020-05-05 (×4): qty 22

## 2020-05-05 MED ORDER — INSULIN GLARGINE 100 UNIT/ML ~~LOC~~ SOLN
6.0000 [IU] | Freq: Once | SUBCUTANEOUS | Status: AC
  Administered 2020-05-05: 6 [IU] via SUBCUTANEOUS
  Filled 2020-05-05: qty 0.06

## 2020-05-05 MED ORDER — MELATONIN 3 MG PO TABS
3.0000 mg | ORAL_TABLET | Freq: Every day | ORAL | Status: DC
  Administered 2020-05-05 – 2020-05-09 (×5): 3 mg via ORAL
  Filled 2020-05-05 (×5): qty 1

## 2020-05-05 MED ORDER — INSULIN ASPART 100 UNIT/ML ~~LOC~~ SOLN
10.0000 [IU] | Freq: Once | SUBCUTANEOUS | Status: AC
  Administered 2020-05-05: 10 [IU] via SUBCUTANEOUS

## 2020-05-05 MED ORDER — INSULIN ASPART 100 UNIT/ML ~~LOC~~ SOLN
0.0000 [IU] | Freq: Three times a day (TID) | SUBCUTANEOUS | Status: DC
  Administered 2020-05-06: 7 [IU] via SUBCUTANEOUS
  Administered 2020-05-06 (×2): 20 [IU] via SUBCUTANEOUS
  Administered 2020-05-07 (×2): 11 [IU] via SUBCUTANEOUS
  Administered 2020-05-07 – 2020-05-08 (×2): 4 [IU] via SUBCUTANEOUS

## 2020-05-05 MED ORDER — CHLORHEXIDINE GLUCONATE CLOTH 2 % EX PADS
6.0000 | MEDICATED_PAD | Freq: Every day | CUTANEOUS | Status: AC
  Administered 2020-05-06 – 2020-05-09 (×4): 6 via TOPICAL

## 2020-05-05 MED ORDER — INSULIN GLARGINE 100 UNIT/ML ~~LOC~~ SOLN
21.0000 [IU] | Freq: Every day | SUBCUTANEOUS | Status: DC
  Administered 2020-05-06: 21 [IU] via SUBCUTANEOUS
  Filled 2020-05-05: qty 0.21

## 2020-05-05 NOTE — Evaluation (Signed)
Occupational Therapy Evaluation Patient Details Name: [name] MRN: [id number] DOB: [birth year] Today's Date: 05/05/2020    History of Present Illness 69 yo female with onset of increased LBP and discovery of pulm nodules was admitted for care.  Has positive Covid 19 and hypoxia, with acute respiratory symptoms since 04/23/20.  PMHx:  metastatic breast CA on chemo, COPD, O2 use, DM, HTN, morbid obesity, chronic hypoxic resp failure,    Clinical Impression   PTA, pt was living with her daughter, son, grandson, and significant other and performed BADLs and light IADLs; using a rollator on "weaker days". Pt currently requiring Min A for UB ADLs, Max A for LB ADLs, and Min Guard A for sit<>stand with RW. Providing education on calm, slow purse lip breathing techniques. Pt performing first sit<>stand and maintain SpO2 >86% on 6L via [place]. Pt performing second sit<>Stand and marching exercises in standing with SpO2 >79% on 8L via HFNC. Cues throughout for purse lip breathing and to lower RR. Pt highly motivated despite fatigue. Pt would benefit from further acute OT to facilitate safe dc. Recommend dc to CIR for intensive OT to optimize safety, independence with ADLs, and return to PLOF.     Follow Up Recommendations  CIR;Supervision/Assistance - 24 hour    Equipment Recommendations  Other (comment) (Defer to next venue)    Recommendations for Other Services PT consult     Precautions / Restrictions Precautions Precautions: Fall Precaution Comments: monitor sats and pulses Restrictions Other Position/Activity Restrictions: has difficulty with BLE tightness and pain in lower legs      Mobility Bed Mobility               General bed mobility comments: up in chair when PT arrived  Transfers Overall transfer level: Needs assistance Equipment used: Rolling walker (2 wheeled) Transfers: Sit to/from Stand Sit to Stand: Min guard         General transfer comment: Min Guard A for  safety    Balance Overall balance assessment: Needs assistance Sitting-balance support: No upper extremity supported;Feet supported Sitting balance-Leahy Scale: Good     Standing balance support: No upper extremity supported;During functional activity;Bilateral upper extremity supported Standing balance-Leahy Scale: Fair                             ADL either performed or assessed with clinical judgement   ADL Overall ADL's : Needs assistance/impaired Eating/Feeding: Set up;Sitting   Grooming: Supervision/safety;Set up;Sitting   Upper Body Bathing: Minimal assistance;Sitting   Lower Body Bathing: Maximal assistance;Sit to/from stand   Upper Body Dressing : Minimal assistance;Sitting   Lower Body Dressing: Maximal assistance;Sit to/from stand   Toilet Transfer: Min guard;RW (simulated at [employer])           Functional mobility during ADLs: Min guard;Rolling walker (sit<>stand only) General ADL Comments: Pt performing sit<>stand x2 with Min guard A. Performing marching in standing upon second trial. Pt presenting with poor activity tolerance     Vision         Perception     Praxis      Pertinent Vitals/Pain Pain Assessment: 0-10 Pain Score: 8  Pain Location: Back and BLEs Pain Descriptors / Indicators: Grimacing;Burning Pain Intervention(s): Monitored during session;Limited activity within patient's tolerance;Repositioned     Hand Dominance Right   Extremity/Trunk Assessment Upper Extremity Assessment Upper Extremity Assessment: Overall WFL for tasks assessed   Lower Extremity Assessment Lower Extremity Assessment: Defer to PT  evaluation   Cervical / Trunk Assessment Cervical / Trunk Assessment: Other exceptions (chronic back pain) Cervical / Trunk Exceptions: Chronic back pain. increased body habitus   Communication Communication Communication: No difficulties   Cognition Arousal/Alertness: Awake/alert Behavior During Therapy: WFL for  tasks assessed/performed Overall Cognitive Status: Within Functional Limits for tasks assessed                                     General Comments  SpO2 95% on 6L, RR 21, and HR 57 at rest. SpO2 maitnaing >86% on 6L and RR 30s during first sit<>stand. Cues for pruse lip breathing to lower RR. Spo2 dropping to 79% during standing marches on 8L via HFNC. Keeping pt on 8L via HFNC at end of session for recovery; notified RN. SpO2 88-86% at end of session    Exercises Exercises: General Lower Extremity;Other exercises General Exercises - Lower Extremity Hip Flexion/Marching: Strengthening;5 reps;Standing Other Exercises Other Exercises: Education and cues for purse lip breathing- calm and slow Other Exercises: IS 500 ml x5   Shoulder Instructions      Home Living Family/patient expects to be discharged to:: Private residence Living Arrangements: Children Available Help at Discharge: Family;Available 24 hours/day Type of Home: Mobile home Home Access: Ramped entrance     Home Layout: One level     Bathroom Shower/Tub: Tub/shower unit (Does not use)   Bathroom Toilet: Handicapped height     Home Equipment: Environmental consultant - 4 wheels;Shower seat   Additional Comments: pt has modifications in her home but does not tolerate getting legs into bathtub      Prior Functioning/Environment Level of Independence: Independent  Gait / Transfers Assistance Needed: Uses rollator when tired and walk "not enough room. Usually doesnt use ae ADL's / Homemaking Assistance Needed: ADL with increased time. Sponge bath at sink. Grandson/daughter helps with socks. Does IADLs while seated   Comments: Family drives        OT Problem List: Decreased strength;Decreased range of motion;Decreased activity tolerance;Impaired balance (sitting and/or standing);Decreased safety awareness;Decreased knowledge of use of DME or AE;Decreased knowledge of precautions;Cardiopulmonary status limiting  activity;Pain      OT Treatment/Interventions: Self-care/ADL training;Therapeutic exercise;Energy conservation;DME and/or AE instruction;Therapeutic activities;Patient/family education    OT Goals(Current goals can be found in the care plan section) Acute Rehab OT Goals Patient Stated Goal: Get back to home and feeling normal OT Goal Formulation: With patient Time For Goal Achievement: 05/19/20 Potential to Achieve Goals: Good  OT Frequency: Min 2X/week   Barriers to D/C:            Co-evaluation              AM-PAC OT "6 Clicks" Daily Activity     Outcome Measure Help from another person eating meals?: None Help from another person taking care of personal grooming?: A Little Help from another person toileting, which includes using toliet, bedpan, or urinal?: A Lot Help from another person bathing (including washing, rinsing, drying)?: A Lot Help from another person to put on and taking off regular upper body clothing?: A Little Help from another person to put on and taking off regular lower body clothing?: A Lot 6 Click Score: 16   End of Session Equipment Utilized During Treatment: Oxygen;Rolling walker (6-8L) Nurse Communication: Mobility status (O2)  Activity Tolerance: Patient tolerated treatment well;Patient limited by fatigue Patient left: in chair;with call bell/phone within reach  OT  Visit Diagnosis: Unsteadiness on feet (R26.81);Other abnormalities of gait and mobility (R26.89);Muscle weakness (generalized) (M62.81);Pain Pain - part of body: Leg (Back)                Time: 9733-1250 OT Time Calculation (min): 37 min Charges:  OT General Charges $OT Visit: 1 Visit OT Evaluation $OT Eval Moderate Complexity: 1 Mod OT Treatments $Self Care/Home Management : 8-22 mins  Dia Jefferys MSOT, OTR/L Acute Rehab Pager: [phone] Office: [place] 05/05/2020, 6:21 PM

## 2020-05-05 NOTE — Progress Notes (Signed)
Family Medicine Teaching Service Daily Progress Note Intern Pager: [phone]  Patient name: [name] Medical record number: [id number] Date of birth: [birth year] Age: 69 y.o. Gender: female  Primary Care Provider: [name], MD Consultants: None Code Status: Full  Pt Overview and Major Events to Date:  Admitted on 9/16  Assessment and Plan: TAYLORANN TKACH a 69 y.o.femalepresenting with worsening shortness of breath and COVID-19. PMH is significant for T2DM, HTN, metastatic breast cancer on chemo, COPD on 3L, recent COVID diagnosis  Shortness of breath 2/2 COVID pneumonia Able only to speak in short sentences. Went up to 8 L nasal canula currently satting in low 90's. - Monitor O2 sats   - On Remdesevir day2 - On Decadron 6 mg day 2 -Tylenol 650mg  q6h PRN   T2DM A1C 9.6;  Home medications include Lantus 28U QHS, Humalog 10U TID with meals.  -Increase to rSSI -CBGs TID with meals and QHS -Increase Lantus to 20U qhs -Linagliptin 5mg  daily -Continue to monitor and adjust medications as necessary   COPD Patient has COPD with baseline 3L O2 at home. Home meds also include albuterol. -Continue albuterol inhaler   Anxiety: Long standing issue with anxiety. Patient currently having Anxiety regarding illness.  Have started Vistaril. -Gave one dose Vistaril 25 mg -Start Vistaril 25 mg PRN q4   HTN Not taking any medications currently. Patient was previously on amlodipine but is no longer taking it. Last visit with PCP in March 2021 recommended possible ARB. -Continue to monitor -If needed start ARB   Malignant breast cancer Patient is currently followed by oncology. Home meds include letrozole 2.5mg  daily. -Continue home letrozole if on formulary   Osteoarthritis of spine Takes Gabapentin at home as needed. -Increase  Gabapentin to 300 mg BID -K pad ordered  FEN/GI: Carb mod/heart healthy PPx: Lovenox   Status is: Inpatient  Remains  inpatient appropriate because:Inpatient level of care appropriate due to severity of illness   Dispo:  Patient From: Home  Planned Disposition: Home  Expected discharge date: 05/10/20  Medically stable for discharge: No         Subjective:  Patient indicates she is still having back pain.  Currently having pain legs as well.  Indicates she can only walk a few feet before feelign weak.    Objective: Temp:  [97.7 F (36.5 C)-98.6 F (37 C)] 98.2 F (36.8 C) (09/18 1347) Pulse Rate:  [52-69] 69 (09/18 1347) Resp:  [16-22] 21 (09/18 1347) BP: (113-174)/(66-84) 130/75 (09/18 1347) SpO2:  [90 %-100 %] 90 % (09/18 1347) Weight:  [147.4 kg] 147.4 kg (09/18 0336) Physical Exam:  Physical Exam Constitutional:      General: She is not in acute distress.    Appearance: Normal appearance. She is not ill-appearing.  HENT:     Head: Normocephalic and atraumatic.     Mouth/Throat:     Mouth: Mucous membranes are moist.  Cardiovascular:     Rate and Rhythm: Normal rate and regular rhythm.     Pulses: Normal pulses.  Pulmonary:     Effort: Pulmonary effort is normal.     Breath sounds: No wheezing, rhonchi or rales.     Comments: Decreased breath sounds, likely due to body habitus Musculoskeletal:     Comments: Mild non-pitting edema, tenderness bilaterally in calves  Skin:    General: Skin is warm.     Capillary Refill: Capillary refill takes less than 2 seconds.  Neurological:     Mental Status: She  is alert.     Laboratory: Recent Labs  Lab 05/03/20 1622 05/04/20 0610 05/05/20 0105  WBC 4.7 4.8 2.9*  HGB 12.9 13.1 12.9  HCT 42.6 42.6 41.7  PLT 201 202 236   Recent Labs  Lab 05/03/20 1622 05/04/20 0610 05/05/20 0105  NA 137 137 137  K 4.7 4.2 4.3  CL 94* 97* 96*  CO2 32 31 33*  BUN 13 14 18   CREATININE 0.70 0.63 0.68  CALCIUM 8.4* 8.2* 8.5*  PROT 6.6 6.8 6.4*  BILITOT 0.4 0.6 0.3  ALKPHOS 112 103 97  ALT 50* 44 42  AST 47* 43* 30  GLUCOSE 358* 300*  310*     Imaging/Diagnostic Tests:  EXAM: PORTABLE CHEST 1 VIEW  COMPARISON:  April 30, 2020  FINDINGS: The heart size and mediastinal contours are mildly enlarged. Aortic calcifications are seen. A right-sided MediPort catheter seen with the tip at the superior cavoatrial junction. Diffusely increased interstitial markings are seen throughout both lungs. Patchy airspace opacity seen at the left lung base.  IMPRESSION: Diffusely increased interstitial markings seen throughout both lungs likely consistent with atelectasis and/or infectious etiology.   Electronically Signed   By: Prudencio Pair M.D.   On: 05/03/2020 15:31  EXAM: CT ANGIOGRAPHY CHEST WITH CONTRAST  TECHNIQUE: Multidetector CT imaging of the chest was performed using the standard protocol during bolus administration of intravenous contrast. Multiplanar CT image reconstructions and MIPs were obtained to evaluate the vascular anatomy.  CONTRAST:  47mL OMNIPAQUE IOHEXOL 350 MG/ML SOLN  COMPARISON:  10/20/2019  FINDINGS: Cardiovascular: Satisfactory opacification of the pulmonary arteries to the segmental level. No evidence of pulmonary embolism. Normal heart size. No pericardial effusion. Right chest port catheter.  Mediastinum/Nodes: Unchanged prominent mediastinal lymph nodes. Thyroid gland, trachea, and esophagus demonstrate no significant findings.  Lungs/Pleura: Extensive bilateral, somewhat geographic heterogeneous and ground-glass airspace opacity. Small pulmonary nodules in the lung apices seen on prior examination are mostly obscured by airspace disease, visualize nodules are however unchanged (series 8, image 24). No pleural effusion or pneumothorax.  Upper Abdomen: No acute abnormality.  Musculoskeletal: No chest wall abnormality. No acute or significant osseous findings.  Review of the MIP images confirms the above findings.  IMPRESSION: 1. Negative examination for  pulmonary embolism. 2. Extensive bilateral, somewhat geographic heterogeneous and ground-glass airspace opacity, consistent with multifocal infection, including COVID-19 if clinically suspected. 3. Small pulmonary nodules in the lung apices seen on prior examination are mostly obscured by airspace disease, visualized nodules are however unchanged. Attention on follow-up. 4. Unchanged prominent mediastinal lymph nodes. Attention on follow-up.   Electronically Signed   By: Eddie Candle M.D.   On: 05/03/2020 19:49  Delora Fuel, MD 05/05/2020, 5:35 PM PGY-1, [place] Intern pager: [phone], text pages welcome

## 2020-05-05 NOTE — Plan of Care (Signed)
Patient is currently resting in chair. C/o pain, given tylenol and Kpad. VSS. Remains on 6L [place]. OOB pivot to [facility]. Waffle in chair. Call bell within reach.   Problem: Clinical Measurements: Goal: Respiratory complications will improve Outcome: Progressing   Problem: Clinical Measurements: Goal: Cardiovascular complication will be avoided Outcome: Progressing   Problem: Activity: Goal: Risk for activity intolerance will decrease Outcome: Progressing   Problem: Nutrition: Goal: Adequate nutrition will be maintained Outcome: Progressing   Problem: Coping: Goal: Level of anxiety will decrease Outcome: Progressing   Problem: Elimination: Goal: Will not experience complications related to bowel motility Outcome: Progressing   Problem: Elimination: Goal: Will not experience complications related to urinary retention Outcome: Progressing   Problem: Pain Managment: Goal: General experience of comfort will improve Outcome: Progressing   Problem: Safety: Goal: Ability to remain free from injury will improve Outcome: Progressing   Problem: Skin Integrity: Goal: Risk for impaired skin integrity will decrease Outcome: Progressing

## 2020-05-05 NOTE — Care Management (Addendum)
Pt with +COVID documented 9/13. Current recs are CIR, however they require 21 day waiting period past +test for admission, or for ID team to [place] precautions.  Will need to consider SNF or HH.

## 2020-05-05 NOTE — Progress Notes (Signed)
Inpatient Rehab Admissions Coordinator Note:   Per therapy recommendations, pt was screened for CIR candidacy by Clemens Catholic, [place] CCC-SLP. At this time, Pt. Has only been admitted for one day 2/2 COVID 19 infection and Pt. Has not yet been seen by OT. Will follow for progress during this admission and place CIR consult order if Pt. Appears appropriate for CIR level therapies.   Clemens Catholic, [place], [place] Admissions Coordinator  [phone] ([place]) [phone] (office)

## 2020-05-06 DIAGNOSIS — J1282 Pneumonia due to coronavirus disease 2019: Secondary | ICD-10-CM

## 2020-05-06 LAB — CBC WITH DIFFERENTIAL/PLATELET
Abs Immature Granulocytes: 0.03 10*3/uL (ref 0.00–0.07)
Basophils Absolute: 0 10*3/uL (ref 0.0–0.1)
Basophils Relative: 0 %
Eosinophils Absolute: 0 10*3/uL (ref 0.0–0.5)
Eosinophils Relative: 0 %
HCT: 39.7 % (ref 36.0–46.0)
Hemoglobin: 12.5 g/dL (ref 12.0–15.0)
Immature Granulocytes: 1 %
Lymphocytes Relative: 14 %
Lymphs Abs: 0.8 10*3/uL (ref 0.7–4.0)
MCH: 27.6 pg (ref 26.0–34.0)
MCHC: 31.5 g/dL (ref 30.0–36.0)
MCV: 87.6 fL (ref 80.0–100.0)
Monocytes Absolute: 0.4 10*3/uL (ref 0.1–1.0)
Monocytes Relative: 8 %
Neutro Abs: 4.4 10*3/uL (ref 1.7–7.7)
Neutrophils Relative %: 77 %
Platelets: 256 10*3/uL (ref 150–400)
RBC: 4.53 MIL/uL (ref 3.87–5.11)
RDW: 13.8 % (ref 11.5–15.5)
WBC: 5.7 10*3/uL (ref 4.0–10.5)
nRBC: 0 % (ref 0.0–0.2)

## 2020-05-06 LAB — D-DIMER, QUANTITATIVE: D-Dimer, Quant: 1.23 ug/mL-FEU — ABNORMAL HIGH (ref 0.00–0.50)

## 2020-05-06 LAB — QUANTIFERON-TB GOLD PLUS: QuantiFERON-TB Gold Plus: NEGATIVE

## 2020-05-06 LAB — COMPREHENSIVE METABOLIC PANEL
ALT: 38 U/L (ref 0–44)
AST: 28 U/L (ref 15–41)
Albumin: 2.4 g/dL — ABNORMAL LOW (ref 3.5–5.0)
Alkaline Phosphatase: 98 U/L (ref 38–126)
Anion gap: 8 (ref 5–15)
BUN: 25 mg/dL — ABNORMAL HIGH (ref 8–23)
CO2: 33 mmol/L — ABNORMAL HIGH (ref 22–32)
Calcium: 8.8 mg/dL — ABNORMAL LOW (ref 8.9–10.3)
Chloride: 96 mmol/L — ABNORMAL LOW (ref 98–111)
Creatinine, Ser: 0.68 mg/dL (ref 0.44–1.00)
GFR calc Af Amer: >60 mL/min (ref >60)
GFR calc non Af Amer: >60 mL/min (ref >60)
Glucose, Bld: 244 mg/dL — ABNORMAL HIGH (ref 70–99)
Potassium: 4.2 mmol/L (ref 3.5–5.1)
Sodium: 137 mmol/L (ref 135–145)
Total Bilirubin: 0.3 mg/dL (ref 0.3–1.2)
Total Protein: 6.6 g/dL (ref 6.5–8.1)

## 2020-05-06 LAB — QUANTIFERON-TB GOLD PLUS (RQFGPL)
QuantiFERON Mitogen Value: 0.82 IU/mL
QuantiFERON Nil Value: 0 IU/mL
QuantiFERON TB1 Ag Value: 0 IU/mL
QuantiFERON TB2 Ag Value: 0 IU/mL

## 2020-05-06 LAB — GLUCOSE, CAPILLARY
Glucose-Capillary: 207 mg/dL — ABNORMAL HIGH (ref 70–99)
Glucose-Capillary: 430 mg/dL — ABNORMAL HIGH (ref 70–99)
Glucose-Capillary: 459 mg/dL — ABNORMAL HIGH (ref 70–99)
Glucose-Capillary: 314 mg/dL — ABNORMAL HIGH (ref 70–99)

## 2020-05-06 LAB — C-REACTIVE PROTEIN: CRP: 6.8 mg/dL — ABNORMAL HIGH (ref <1.0)

## 2020-05-06 MED ORDER — INSULIN GLARGINE 100 UNITS/ML SOLOSTAR PEN: 10.0000 [IU] | PEN_INJECTOR | Freq: Every day | SUBCUTANEOUS | Status: DC

## 2020-05-06 MED ORDER — INSULIN GLARGINE 100 UNIT/ML ~~LOC~~ SOLN
30.0000 [IU] | Freq: Every day | SUBCUTANEOUS | Status: DC
  Administered 2020-05-07 – 2020-05-09 (×3): 30 [IU] via SUBCUTANEOUS
  Filled 2020-05-06 (×3): qty 0.3

## 2020-05-06 MED ORDER — INSULIN ASPART 100 UNIT/ML ~~LOC~~ SOLN
5.0000 [IU] | Freq: Three times a day (TID) | SUBCUTANEOUS | Status: DC
  Administered 2020-05-07 – 2020-05-08 (×4): 5 [IU] via SUBCUTANEOUS

## 2020-05-06 MED ORDER — INSULIN GLARGINE 100 UNIT/ML ~~LOC~~ SOLN
10.0000 [IU] | Freq: Every day | SUBCUTANEOUS | Status: DC
  Filled 2020-05-06: qty 0.1

## 2020-05-06 MED ORDER — INSULIN ASPART 100 UNIT/ML ~~LOC~~ SOLN: 0.0000 [IU] | Freq: Three times a day (TID) | SUBCUTANEOUS | Status: DC

## 2020-05-06 MED ORDER — INFLUENZA VAC A&B SA ADJ QUAD 0.5 ML IM PRSY
0.5000 mL | PREFILLED_SYRINGE | INTRAMUSCULAR | Status: DC
  Filled 2020-05-06: qty 0.5

## 2020-05-06 MED ORDER — INSULIN GLARGINE 100 UNIT/ML ~~LOC~~ SOLN
10.0000 [IU] | Freq: Once | SUBCUTANEOUS | Status: AC
  Administered 2020-05-06: 10 [IU] via SUBCUTANEOUS
  Filled 2020-05-06: qty 0.1

## 2020-05-06 MED ORDER — KETOROLAC TROMETHAMINE 30 MG/ML IJ SOLN
30.0000 mg | Freq: Once | INTRAMUSCULAR | Status: AC
  Administered 2020-05-06: 30 mg via INTRAVENOUS
  Filled 2020-05-06: qty 1

## 2020-05-06 MED ORDER — OXYCODONE HCL 5 MG PO TABS
5.0000 mg | ORAL_TABLET | Freq: Once | ORAL | Status: AC
  Administered 2020-05-06: 5 mg via ORAL
  Filled 2020-05-06: qty 1

## 2020-05-06 NOTE — Plan of Care (Signed)
Patient is currently resting in bed. C/o pain in back and legs, given tylenol and Kpad.c/o throbbing headache, one time dose of oxy given. Patient very anxious throughout the night, give scheduled atarax and one time dose of melatonin. VSS. Patient remains on 15L HFNC, weaned down to 14L this AM. SOB when talking and movement. IS in use at bedside. Call bell within reach.   Problem: Education: Goal: Knowledge of General Education information will improve Description: Including pain rating scale, medication(s)/side effects and non-pharmacologic comfort measures Outcome: Progressing   Problem: Health Behavior/Discharge Planning: Goal: Ability to manage health-related needs will improve Outcome: Progressing   Problem: Clinical Measurements: Goal: Ability to maintain clinical measurements within normal limits will improve Outcome: Progressing Goal: Will remain free from infection Outcome: Progressing Goal: Diagnostic test results will improve Outcome: Progressing Goal: Respiratory complications will improve Outcome: Progressing Goal: Cardiovascular complication will be avoided Outcome: Progressing   Problem: Activity: Goal: Risk for activity intolerance will decrease Outcome: Progressing   Problem: Nutrition: Goal: Adequate nutrition will be maintained Outcome: Progressing   Problem: Coping: Goal: Level of anxiety will decrease Outcome: Progressing   Problem: Elimination: Goal: Will not experience complications related to bowel motility Outcome: Progressing Goal: Will not experience complications related to urinary retention Outcome: Progressing   Problem: Pain Managment: Goal: General experience of comfort will improve Outcome: Progressing   Problem: Safety: Goal: Ability to remain free from injury will improve Outcome: Progressing   Problem: Skin Integrity: Goal: Risk for impaired skin integrity will decrease Outcome: Progressing

## 2020-05-06 NOTE — Progress Notes (Addendum)
Family Medicine Teaching Service Daily Progress Note Intern Pager: [phone]  Patient name: [name] Medical record number: [id number] Date of birth: [birth year] Age: 69 y.o. Gender: female  Primary Care Provider: [name], MD Consultants: None Code Status: Full  Pt Overview and Major Events to Date:  Admitted on 9/16  Assessment and Plan:  [name] a 69 y.o.femalepresenting with worsening shortness of breath and COVID-19. PMH is significant for T2DM, HTN, metastatic breast cancer on chemo, COPD on 3L, recent COVID diagnosis.   Shortness of breath 2/2 COVID pneumonia Able to speak in complete sentences and have a complete conversation without drop in O2 sat.. Currently on 15 HFNC with O2 sat at 91%. -Tylenol 650mg  q6h PRN -Baracitinib 4 mg for 14 days (9/17- -Remdesivir 200mg  x1 followed by 100mg  daily x4 days (9/17- -Dexamethasone 6mg  x10 days (9/17- -Supplemental O2as needed for O2 goal 90-94%   T2DM A1C 9.6; CBG 244, patient has been unable to tolerate much PO. Home medications include Lantus 28U QHS, Humalog 10U TID with meals.  -sSSI -CBGs TID with meals and QHS -Lantus 21 QHS -Linagliptin 5mg  daily -Continue to monitor and adjust medications as necessary   COPD Patient has COPD with baseline 3L O2 at home. Home meds also include albuterol. -Continue albuterol inhaler   Anxiety: Currently stable and able to have conversation without becoming tearful. Long standing issue with anxiety. Have started Vistaril. -Gave one dose Vistaril 25 mg -Start Vistaril 25 mg PRN q4   HTN Not taking any medications currently. Patient was previously on amlodipine but is no longer taking it. Last visit with PCP in March 2021 recommended possible ARB. -Continue to monitor -If needed start ARB   Malignant breast cancer Patient is currently followed by oncology. Home meds include letrozole 2.5mg  daily. -Continue home letrozole if on  formulary   Osteoarthritis of spine Patient has chronic low back pain with possible neuropathic symptoms of burning/stinging. Home medications include Gabapentin reportedly being taken at home as 300mg  PRN for pain, hold at the moment unless patient complains of pain and then consider restarting.  -Continue Gabapentin 300 mg daily PRN -K pad  FEN/GI: Carb Modified/Heart Healthy PPx: Lovenox  Disposition: Med-Surg  Prior to Admission Living Arrangement: Home Anticipated Discharge Location: Home Barriers to Discharge: Oxygen Requirements Anticipated discharge in approximately 2-5 day(s).   Subjective:  Interviewed patient chairside. She is reporting that her anxiety is improved. She reports that her breathing is doing "ok." She was concerned that she would be discharged after she finished her infusion course. Discussed that she would need to have her breathing improve to the point that she no longer needed more than 4 L to be discharged. Encouraged her getting the COVID, Flu, and Pneumonia vaccine after being discharged and waiting the appropriate amount of time.  Objective: Temp:  [97.9 F (36.6 C)-98.2 F (36.8 C)] 97.9 F (36.6 C) (09/19 0507) Pulse Rate:  [54-69] 54 (09/19 0507) Resp:  [20-21] 20 (09/19 0507) BP: (130-157)/(59-79) 157/79 (09/19 0507) SpO2:  [90 %-98 %] 98 % (09/19 0559) Physical Exam:  Physical Exam Vitals and nursing note reviewed.  Constitutional:      General: She is not in acute distress.    Appearance: Normal appearance. She is obese. She is not ill-appearing, toxic-appearing or diaphoretic.  HENT:     Head: Normocephalic and atraumatic.  Cardiovascular:     Rate and Rhythm: Regular rhythm. Bradycardia present.     Pulses:  Radial pulses are 2+ on the right side and 2+ on the left side.       Dorsalis pedis pulses are 2+ on the right side and 2+ on the left side.     Heart sounds: Normal heart sounds, S1 normal and S2 normal.  Pulmonary:      Effort: No respiratory distress.     Breath sounds: Wheezing and rales present.  Musculoskeletal:     Right lower leg: Edema present.     Left lower leg: Edema present.  Neurological:     Mental Status: She is alert. Mental status is at baseline.  Psychiatric:        Mood and Affect: Mood normal.        Behavior: Behavior normal.        Thought Content: Thought content normal.      Laboratory: Recent Labs  Lab 05/04/20 0610 05/05/20 0105 05/06/20 0416  WBC 4.8 2.9* 5.7  HGB 13.1 12.9 12.5  HCT 42.6 41.7 39.7  PLT 202 236 256   Recent Labs  Lab 05/04/20 0610 05/05/20 0105 05/06/20 0416  NA 137 137 137  K 4.2 4.3 4.2  CL 97* 96* 96*  CO2 31 33* 33*  BUN 14 18 25*  CREATININE 0.63 0.68 0.68  CALCIUM 8.2* 8.5* 8.8*  PROT 6.8 6.4* 6.6  BILITOT 0.6 0.3 0.3  ALKPHOS 103 97 98  ALT 44 42 38  AST 43* 30 28  GLUCOSE 300* 310* 244*    CRP: 6.8 (high) D-Dimer: 1.23 (high)  Imaging/Diagnostic Tests:  CT Angio Chest PE W and/or WO Contrast: Negative examination for pulmonary embolism. Extensive bilateral, somewhat geographic heterogeneous and ground-glass airspace opacity, consistent with multifocal infection, including COVID-19 if clinically suspected. Small pulmonary nodules in the lung apices seen on prior examination are mostly obscured by airspace disease, visualized nodules are however unchanged. Attention on follow-up. Unchanged prominent mediastinal lymph nodes. Attention on follow-up.   Vas [place] Lower Extremity Venous:  RIGHT:  - There is no evidence of deep vein thrombosis in the lower extremity.  However, portions of this examination were limited- see technologist  comments above.  - No cystic structure found in the popliteal fossa.    LEFT:  - There is no evidence of deep vein thrombosis in the lower extremity.  However, portions of this examination were limited- see technologist  comments above.  - No cystic structure found in the popliteal fossa.     DG Chest Portable 1 View: Diffusely increased interstitial markings seen throughout both lungs likely consistent with atelectasis and/or infectious etiology.   Briant Cedar, MD 05/06/2020, 6:15 AM PGY-1, [place] Intern pager: [phone], text pages welcome

## 2020-05-06 NOTE — Evaluation (Addendum)
Clinical/Bedside Swallow Evaluation Patient Details  Name: [name] MRN: [id number] Date of Birth: 04/13/1951  Today's Date: 05/06/2020 Time: SLP Start Time (ACUTE ONLY): 1135 SLP Stop Time (ACUTE ONLY): 1158 SLP Time Calculation (min) (ACUTE ONLY): 23 min  Past Medical History:  Past Medical History:  Diagnosis Date  . Arthritis   . Back pain   . Breast cancer ([place]) dx'd 11/2012   left  . Chest pain   . COPD (chronic obstructive pulmonary disease) ([place])   . Diabetes mellitus without complication ([place]) 02/21/2375  . Ear pain   . Fatty liver 6/03  . Hypertension   . Lung disease   . Lung metastases ([place]) dx'd 11/2012  . Obesity, unspecified   . Other abnormal glucose   . Suicide attempt ([place]) 1996  . Syncope and collapse   . Unspecified sleep apnea    Past Surgical History:  Past Surgical History:  Procedure Laterality Date  . CARDIAC CATHETERIZATION     2007  . CHOLECYSTECTOMY    . TUBAL LIGATION     HPI:  Pt is a 69 y.o. female with PMH of T2DM, HTN, metastatic breast cancer on chemo, COPD on 3L who presented with worsening shortness of breath and COVID-19. CT chest: Negative examination for pulmonary embolism. Extensive bilateral, somewhat geographic heterogeneous and ground-glass airspace opacity, consistent with multifocal infection, including COVID-19. Nursing reported that the choked on meat on 9/18 and reported to nursing that the pt has had globus sensation for the past weeks.    Assessment / Plan / Recommendation Clinical Impression  Pt was seen for bedside swallow evaluation and she denied a history of dysphagia prior to illness. However, she stated that since she became ill with COVID she has been having difficulty with "choking" on solids and feeling that they "build up". Oral mechanism exam was [facility] and she presented with maxillary dentures only. She tolerated all solids and liquids without overt s/sx of aspiration. However, mastication was prolonged with regular  textures and she demonstrated desaturation to 86% and increased dyspnea during mastication. Pt denied globus sensation with any solids during this session; however, considering her c/o of feeling that solids "build up" and "get higher and higher" until she "chokes", the possibility of there being an esophageal component is considered. Still, pt's respiratory status is likely a significant contributing factor to her dysphagia. A dysphagia 2 diet with thin liquids is recommended at this time. SLP will follow to assess tolerance of this diet and determine whether further intervention is clinically indicated.  SLP Visit Diagnosis: Dysphagia, unspecified (R13.10)    Aspiration Risk  Mild aspiration risk;Moderate aspiration risk    Diet Recommendation Dysphagia 2 (Fine chop);Thin liquid   Liquid Administration via: Straw;Cup Medication Administration: Whole meds with liquid Supervision: Patient able to self feed Compensations: Slow rate;Small sips/bites;Follow solids with liquid Postural Changes: Seated upright at 90 degrees;Remain upright for at least 30 minutes after po intake    Other  Recommendations Oral Care Recommendations: Oral care BID   Follow up Recommendations Other (comment) (TBD)      Frequency and Duration min 2x/week  2 weeks       Prognosis Prognosis for Safe Diet Advancement: Good      Swallow Study   General Date of Onset: 05/05/20 HPI: Pt is a 69 y.o. female with PMH of T2DM, HTN, metastatic breast cancer on chemo, COPD on 3L who presented with worsening shortness of breath and COVID-19. CT chest: Negative examination for pulmonary embolism. Extensive  bilateral, somewhat geographic heterogeneous and ground-glass airspace opacity, consistent with multifocal infection, including COVID-19. Nursing reported that the choked on meat on 9/18 and reported to nursing that the pt has had globus sensation for the past weeks.  Type of Study: Bedside Swallow Evaluation Previous  Swallow Assessment: None Diet Prior to this Study: Dysphagia 3 (soft);Thin liquids Temperature Spikes Noted: No Respiratory Status: Nasal cannula History of Recent Intubation: No Behavior/Cognition: Alert;Cooperative;Pleasant mood Oral Cavity Assessment: Within Functional Limits Oral Care Completed by SLP: No Vision: Functional for self-feeding Self-Feeding Abilities: Able to feed self Patient Positioning: Upright in chair;Postural control adequate for testing Baseline Vocal Quality: Normal Volitional Cough: Strong Volitional Swallow: Able to elicit    Oral/Motor/Sensory Function Overall Oral Motor/Sensory Function: Within functional limits   Ice Chips Ice chips: Within functional limits Presentation: Spoon   Thin Liquid Thin Liquid: Within functional limits Presentation: Straw    Nectar Thick Nectar Thick Liquid: Not tested   Honey Thick Honey Thick Liquid: Not tested   Puree Puree: Within functional limits Presentation: Spoon   Solid     Solid: Impaired Presentation: Self Fed Pharyngeal Phase Impairments: Change in Vital Signs     [name] I. [name], [name], [place] Office number [phone] Pager [phone]  [name] 05/06/2020,1:37 PM

## 2020-05-07 LAB — CBC WITH DIFFERENTIAL/PLATELET
Abs Immature Granulocytes: 0.04 10*3/uL (ref 0.00–0.07)
Basophils Absolute: 0 10*3/uL (ref 0.0–0.1)
Basophils Relative: 0 %
Eosinophils Absolute: 0 10*3/uL (ref 0.0–0.5)
Eosinophils Relative: 0 %
HCT: 44 % (ref 36.0–46.0)
Hemoglobin: 13.9 g/dL (ref 12.0–15.0)
Immature Granulocytes: 1 %
Lymphocytes Relative: 18 %
Lymphs Abs: 1.3 10*3/uL (ref 0.7–4.0)
MCH: 27.4 pg (ref 26.0–34.0)
MCHC: 31.6 g/dL (ref 30.0–36.0)
MCV: 86.8 fL (ref 80.0–100.0)
Monocytes Absolute: 0.5 10*3/uL (ref 0.1–1.0)
Monocytes Relative: 8 %
Neutro Abs: 5.2 10*3/uL (ref 1.7–7.7)
Neutrophils Relative %: 73 %
Platelets: 359 10*3/uL (ref 150–400)
RBC: 5.07 MIL/uL (ref 3.87–5.11)
RDW: 13.7 % (ref 11.5–15.5)
WBC: 7.1 10*3/uL (ref 4.0–10.5)
nRBC: 0 % (ref 0.0–0.2)

## 2020-05-07 LAB — COMPREHENSIVE METABOLIC PANEL
ALT: 48 U/L — ABNORMAL HIGH (ref 0–44)
AST: 53 U/L — ABNORMAL HIGH (ref 15–41)
Albumin: 2.3 g/dL — ABNORMAL LOW (ref 3.5–5.0)
Alkaline Phosphatase: 124 U/L (ref 38–126)
Anion gap: 8 (ref 5–15)
BUN: 22 mg/dL (ref 8–23)
CO2: 32 mmol/L (ref 22–32)
Calcium: 8.9 mg/dL (ref 8.9–10.3)
Chloride: 98 mmol/L (ref 98–111)
Creatinine, Ser: 0.64 mg/dL (ref 0.44–1.00)
GFR calc Af Amer: >60 mL/min (ref >60)
GFR calc non Af Amer: >60 mL/min (ref >60)
Glucose, Bld: 202 mg/dL — ABNORMAL HIGH (ref 70–99)
Potassium: 4.7 mmol/L (ref 3.5–5.1)
Sodium: 138 mmol/L (ref 135–145)
Total Bilirubin: 0.6 mg/dL (ref 0.3–1.2)
Total Protein: 6.5 g/dL (ref 6.5–8.1)

## 2020-05-07 LAB — C-REACTIVE PROTEIN: CRP: 4.8 mg/dL — ABNORMAL HIGH (ref <1.0)

## 2020-05-07 LAB — GLUCOSE, CAPILLARY
Glucose-Capillary: 172 mg/dL — ABNORMAL HIGH (ref 70–99)
Glucose-Capillary: 288 mg/dL — ABNORMAL HIGH (ref 70–99)
Glucose-Capillary: 293 mg/dL — ABNORMAL HIGH (ref 70–99)
Glucose-Capillary: 297 mg/dL — ABNORMAL HIGH (ref 70–99)

## 2020-05-07 MED ORDER — HYDROXYZINE HCL 25 MG PO TABS: 50.0000 mg | ORAL_TABLET | Freq: Three times a day (TID) | ORAL | Status: DC

## 2020-05-07 MED ORDER — HYDROXYZINE HCL 25 MG PO TABS
50.0000 mg | ORAL_TABLET | Freq: Three times a day (TID) | ORAL | Status: DC
  Administered 2020-05-07 – 2020-05-08 (×5): 50 mg via ORAL
  Filled 2020-05-07 (×5): qty 2

## 2020-05-07 NOTE — Plan of Care (Signed)
Patient is currently resting in bed. C/o pain, given tylenol and one time dose of Toradol. Standby assist to [facility] VSS. Oxygen weaned down to 11L HFNC. Encouraged to concentrate on her breathing. Patient anxious throughout the night. Call bell within reach.   Problem: Education: Goal: Knowledge of General Education information will improve Description: Including pain rating scale, medication(s)/side effects and non-pharmacologic comfort measures Outcome: Progressing   Problem: Health Behavior/Discharge Planning: Goal: Ability to manage health-related needs will improve Outcome: Progressing   Problem: Clinical Measurements: Goal: Ability to maintain clinical measurements within normal limits will improve Outcome: Progressing Goal: Will remain free from infection Outcome: Progressing Goal: Diagnostic test results will improve Outcome: Progressing Goal: Respiratory complications will improve Outcome: Progressing Goal: Cardiovascular complication will be avoided Outcome: Progressing   Problem: Activity: Goal: Risk for activity intolerance will decrease Outcome: Progressing   Problem: Nutrition: Goal: Adequate nutrition will be maintained Outcome: Progressing   Problem: Coping: Goal: Level of anxiety will decrease Outcome: Progressing   Problem: Elimination: Goal: Will not experience complications related to bowel motility Outcome: Progressing Goal: Will not experience complications related to urinary retention Outcome: Progressing   Problem: Pain Managment: Goal: General experience of comfort will improve Outcome: Progressing   Problem: Safety: Goal: Ability to remain free from injury will improve Outcome: Progressing   Problem: Skin Integrity: Goal: Risk for impaired skin integrity will decrease Outcome: Progressing

## 2020-05-07 NOTE — Progress Notes (Signed)
Family Medicine Teaching Service Daily Progress Note Intern Pager: [phone]  Patient name: ILIYANA Moss Medical record number: [id number] Date of birth: Jan 29, 1951 Age: 69 y.o. Gender: female  Primary Care Provider: [name], MD Consultants: None Code Status: Full  Pt Overview and Major Events to Date:  Admitted on 9/16  Assessment and Plan:  [name] a 69 y.o.femalepresenting with worsening shortness of breath and COVID-19. PMH is significant for T2DM, HTN, metastatic breast cancer on chemo, COPD on 3L, recent COVID diagnosis.   Shortness of breath 2/2 COVID pneumonia Able to speak in complete sentences and have a complete conversation without drop in O2 sat.. Currently on 13 HFNC with O2 sat at 93%. -Tylenol 650mg  q6h scheduled -Baracitinib 4 mg for 14 days (9/17- -Remdesivir 200mg  x1 followed by 100mg  daily x4 days (9/17- -Dexamethasone 6mg  x10 days (9/17- -Supplemental O2as needed for O2 goal 90-94%   T2DM A1C9.6; Home medications include Lantus 28U QHS, Humalog 10U TID with meals. Now that she has been able to tolerate oral intake CBG's have been uncontrolled with multiple readings in the 400's. Yesterday received a total of 47 units of Novolog. Will increase Lantus to 30 units and start 5 units Novolog with each meal and continue resistant sliding scale -rSSI -CBGs TID with meals and QHS -Novolog 5 units TID with meals -Lantus 30 at 10 AM -Stopped Linagliptin 5mg  daily -Continue to monitor and adjust medications as necessary   COPD Patient has COPD with baseline 3L O2 at home. Home meds also include albuterol. -Continue albuterol inhaler   Anxiety: Able to have conversation without becoming tearful.Long standing issue with anxiety.She reports that her anxiety is increasing. -Increase Vistaril to 50 mg TID   HTN Not taking any medications currently. Patient was previouslyonamlodipine but is no longer taking it. Last visit with  PCP in March 2021 recommended possible ARB. -Continue to monitor -If needed start ARB   Malignant breast cancer Patient is currently followed by oncology. Home meds include letrozole 2.5mg  daily. -Continue home letrozole if on formulary   Osteoarthritis of spine Patient has chronic low back pain with possible neuropathic symptoms of burning/stinging. Home medications include Gabapentin reportedly being taken at home as 300mg  PRN for pain, hold at the moment unless patient complains of pain and then consider restarting.  -Continue Gabapentin 300 mg daily PRN -K pad   FEN/GI: Carb Modified/Heart Healthy PPx: Lovenox  Disposition: Med-Surg  Prior to Admission Living Arrangement: Home Anticipated Discharge Location: Home Barriers to Discharge: Oxygen Requirements Anticipated discharge in approximately 2-5 day(s).   Subjective:  Interviewed patient at chairside.  She reports that today she feels achy and is cold. She describes it as "flu" symptoms. She describes that she has a history of UTI with Linagliptin and so had stopped taking it. She also reports having increased anxiety and if her dose of antianxiety medications.  Objective: Temp:  [97.9 F (36.6 C)-98.7 F (37.1 C)] 97.9 F (36.6 C) (09/20 0541) Pulse Rate:  [54-73] 54 (09/20 0541) Resp:  [18-22] 18 (09/20 0541) BP: (153-155)/(66-69) 153/69 (09/20 0541) SpO2:  [92 %-100 %] 97 % (09/20 0541) Physical Exam:  Physical Exam Vitals and nursing note reviewed.  Constitutional:      General: She is not in acute distress.    Appearance: She is obese. She is ill-appearing and diaphoretic. She is not toxic-appearing.  HENT:     Head: Normocephalic and atraumatic.  Cardiovascular:     Rate and Rhythm: Regular rhythm. Bradycardia present.  Pulses: Normal pulses.          Radial pulses are 2+ on the right side and 2+ on the left side.       Dorsalis pedis pulses are 2+ on the right side and 2+ on the left side.      Heart sounds: Normal heart sounds, S1 normal and S2 normal. No murmur heard.   Pulmonary:     Effort: No respiratory distress.     Breath sounds: Wheezing and rales present.  Musculoskeletal:     Right lower leg: 1+ Pitting Edema present.     Left lower leg: 1+ Pitting Edema present.  Neurological:     Mental Status: She is alert.      Laboratory: Recent Labs  Lab 05/05/20 0105 05/06/20 0416 05/07/20 0351  WBC 2.9* 5.7 7.1  HGB 12.9 12.5 13.9  HCT 41.7 39.7 44.0  PLT 236 256 359   Recent Labs  Lab 05/05/20 0105 05/06/20 0416 05/07/20 0351  NA 137 137 138  K 4.3 4.2 4.7  CL 96* 96* 98  CO2 33* 33* 32  BUN 18 25* 22  CREATININE 0.68 0.68 0.64  CALCIUM 8.5* 8.8* 8.9  PROT 6.4* 6.6 6.5  BILITOT 0.3 0.3 0.6  ALKPHOS 97 98 124  ALT 42 38 48*  AST 30 28 53*  GLUCOSE 310* 244* 202*    CRP: 4.8 (high)   Imaging/Diagnostic Tests:   CT Angio Chest PE W and/or WO Contrast:Negative examination for pulmonary embolism. Extensive bilateral, somewhat geographic heterogeneous and ground-glass airspace opacity, consistent with multifocal infection, including COVID-19 if clinically suspected. Small pulmonary nodules in the lung apices seen on prior examination are mostly obscured by airspace disease, visualized nodules are however unchanged. Attention on follow-up. Unchanged prominent mediastinal lymph nodes. Attention on follow-up.   Vas [place] Lower Extremity Venous: RIGHT:  - There is no evidence of deep vein thrombosis in the lower extremity.  However, portions of this examination were limited- see technologist  comments above.  - No cystic structure found in the popliteal fossa.   LEFT:  - There is no evidence of deep vein thrombosis in the lower extremity.  However, portions of this examination were limited- see technologist  comments above.  - No cystic structure found in the popliteal fossa.    DG Chest Portable 1 View:Diffusely increased interstitial  markings seen throughout both lungs likely consistent with atelectasis and/or infectious etiology   Briant Cedar, MD 05/07/2020, 6:01 AM PGY-1, [place] Intern pager: [phone], text pages welcome

## 2020-05-07 NOTE — TOC Progression Note (Signed)
Transition of Care [facility]) - Progression Note    Patient Details  Name: [name] MRN: [id number] Date of Birth: 04/21/1951  Transition of Care [facility]) CM/SW [place], RN Phone Number: 05/07/2020, 9:33 AM  Clinical Narrative:    Case manager called patient in her room to discuss probable need for skilled nursing facility once discharged.  Patient understands and agreeable to skilled nursing facility either [place] or [place].  Also patient said that if needed someone could bring Letrozole to the facility as this medication would not be available to her.   Expected Discharge Plan: [place] Barriers to Discharge: No Barriers Identified  Expected Discharge Plan and Services Expected Discharge Plan: [place]   Discharge Planning Services: CM Consult   Living arrangements for the past 2 months: Single Family Home                                       Social Determinants of Health (SDOH) Interventions    Readmission Risk Interventions No flowsheet data found.

## 2020-05-07 NOTE — Progress Notes (Signed)
Inpatient Rehab Admissions Coordinator:   Pt. Has SNF bed offer and is amenable to SNF. CIR would not be able to take patient until she is at least 10 days post COVID+ test and off of airborne/contact precautions. I will not pursue  CIR consult at this time.   Clemens Catholic, [place], [place] Admissions Coordinator  [phone] ([place]) [phone] (office)

## 2020-05-07 NOTE — Progress Notes (Signed)
  Speech Language Pathology Treatment: Dysphagia  Patient Details Name: [name] MRN: [id number] DOB: 1950/09/19 Today's Date: 05/07/2020 Time: 4008-6761 SLP Time Calculation (min) (ACUTE ONLY): 12 min  Assessment / Plan / Recommendation Clinical Impression  Pt currently consuming Dys 2 (fine chopped) texture after swallow assessment yesterday. Pt reports she is satisfied with this texture and would like to remain on Dys 2 and reported the carrots were hard to masticate today. She is dyspneic with eating and speaking. Pt questioned further re: hx of esophageal deficits and she reported having esophageal dilation 20 years ago and occasionally has difficulty with meat prior to admission. No s/s aspiration with straw sips thin. Continue Dys 2 (pt request), thin liquids. Educated pt re: esophageal precautions and to see GI if esophageal issues persists after recovers from Covid.     HPI HPI: Pt is a 69 y.o. female with PMH of T2DM, HTN, metastatic breast cancer on chemo, COPD on 3L who presented with worsening shortness of breath and COVID-19. CT chest: Negative examination for pulmonary embolism. Extensive bilateral, somewhat geographic heterogeneous and ground-glass airspace opacity, consistent with multifocal infection, including COVID-19. Nursing reported that the choked on meat on 9/18 and reported to nursing that the pt has had globus sensation for the past weeks.       SLP Plan  All goals met;Discharge SLP treatment due to (comment)       Recommendations  Diet recommendations: Dysphagia 2 (fine chop);Thin liquid Liquids provided via: Cup;Straw Medication Administration: Whole meds with liquid Supervision: Patient able to self feed Compensations: Follow solids with liquid Postural Changes and/or Swallow Maneuvers: Seated upright 90 degrees;Upright 30-60 min after meal                Oral Care Recommendations: Oral care BID Follow up Recommendations: None SLP Visit Diagnosis:  Dysphagia, unspecified (R13.10) Plan: All goals met;Discharge SLP treatment due to (comment)       GO                [name] 05/07/2020, 3:16 PM  Orbie Pyo Colvin Caroli.Ed Risk analyst [phone] Office [phone]

## 2020-05-07 NOTE — NC FL2 (Signed)
[place] LEVEL OF CARE SCREENING TOOL     IDENTIFICATION  Patient Name: [name] Birthdate: 1951-07-28 Sex: female Admission Date (Current Location): 05/03/2020  [facility] and [place] Number:  Herbalist and Address:  The . [facility], [place] [address] [place], [place] [address]      Provider Number: [id number]  Attending Physician Name and Address:  Kinnie Feil, MD  Relative Name and Phone Number:       Current Level of Care: Hospital Recommended Level of Care: [place] Prior Approval Number:    Date Approved/Denied:   PASRR Number: [id number]  Discharge Plan: SNF    Current Diagnoses: Patient Active Problem List   Diagnosis Date Noted  . Hypoxia   . Pneumonia due to COVID-19 virus 05/03/2020  . Port-A-Cath in place 03/21/2020  . Goals of care, counseling/discussion 08/05/2017  . Obesity, morbid, BMI 50 or higher ([place]) 10/02/2015  . Diabetes mellitus type 2 with neurological manifestations ([place]) 09/28/2014  . Malignant neoplasm of upper-inner quadrant of left breast in female, estrogen receptor positive ([place]) 05/23/2013  . 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 ([place]) 12/14/2012  . Anxiety state 10/15/2006  . HYPERTENSION, BENIGN SYSTEMIC 10/15/2006  . Osteoarthritis of spine 10/15/2006  . LUMBAR SPINAL STENOSIS 10/15/2006  . Sleep apnea 10/15/2006    Orientation RESPIRATION BLADDER Height & Weight     Self, Time, Situation, Place  O2 Continent Weight: (!) 147.4 kg Height:  5\' 4"  (162.6 cm)  BEHAVIORAL SYMPTOMS/MOOD NEUROLOGICAL BOWEL NUTRITION STATUS      Continent Diet (See DC summary)  AMBULATORY STATUS COMMUNICATION OF NEEDS Skin   Limited Assist Verbally Normal                       Personal Care Assistance Level of Assistance  Bathing, Dressing Bathing Assistance: Limited assistance   Dressing Assistance: Limited assistance     Functional Limitations Info              SPECIAL CARE FACTORS FREQUENCY  PT (By licensed PT), OT (By licensed OT)     PT Frequency: 5x week OT Frequency: 5x week            Contractures Contractures Info: Not present    Additional Factors Info  Code Status, Allergies, Isolation Precautions, Insulin Sliding Scale Code Status Info: Full Allergies Info: Meperidine Hcl, Penicillins, Amoxicillin, Aspirin, Percocet (Oxycodone-acetaminophen)   Insulin Sliding Scale Info: see dc summary Isolation Precautions Info: COVID+     Current Medications (05/07/2020):  This is the current hospital active medication list Current Facility-Administered Medications  Medication Dose Route Frequency Provider Last Rate Last Admin  . 0.9 %  sodium chloride infusion  250 mL Intravenous PRN Lilland, Alana, DO      . 0.9 %  sodium chloride infusion   Intravenous Continuous Lilland, Alana, DO   Stopped at 05/05/20 0540  . acetaminophen (TYLENOL) tablet 650 mg  650 mg Oral Q6H Mullis, Kiersten P, DO   650 mg at 05/07/20 0606  . albuterol (VENTOLIN HFA) 108 (90 Base) MCG/ACT inhaler 2 puff  2 puff Inhalation Q6H PRN Lilland, Alana, DO   2 puff at 05/05/20 2042  . baricitinib (OLUMIANT) tablet 4 mg  4 mg Oral Daily Mullis, Kiersten P, DO   4 mg at 05/07/20 0810  . Chlorhexidine Gluconate Cloth 2 % PADS 6 each  6 each Topical Daily Lilland, Alana, DO   6 each at 05/06/20  7482  . Chlorhexidine Gluconate Cloth 2 % PADS 6 each  6 each Topical Q0600 Axel Filler, MD   6 each at 05/07/20 0617  . dexamethasone (DECADRON) injection 6 mg  6 mg Intravenous Q24H Lilland, Alana, DO   6 mg at 05/07/20 0606  . enoxaparin (LOVENOX) injection 75 mg  75 mg Subcutaneous Q0600 Lilland, Alana, DO   75 mg at 05/07/20 0606  . gabapentin (NEURONTIN) capsule 300 mg  300 mg Oral BID Mullis, Kiersten P, DO   300 mg at 05/07/20 0810  . hydrOXYzine (ATARAX/VISTARIL) tablet 50 mg  50 mg Oral TID Briant Cedar, MD      . influenza vaccine adjuvanted (FLUAD)  injection 0.5 mL  0.5 mL Intramuscular Tomorrow-1000 Eniola, Kehinde T, MD      . insulin aspart (novoLOG) injection 0-20 Units  0-20 Units Subcutaneous TID WC Delora Fuel, MD   4 Units at 05/07/20 0815  . insulin aspart (novoLOG) injection 5 Units  5 Units Subcutaneous TID WC Mullis, Kiersten P, DO   5 Units at 05/07/20 0811  . insulin glargine (LANTUS) injection 30 Units  30 Units Subcutaneous Daily Mullis, Kiersten P, DO   30 Units at 05/07/20 7078  . letrozole [facility]) tablet 2.5 mg  2.5 mg Oral Daily Lilland, Alana, DO   2.5 mg at 05/07/20 0811  . melatonin tablet 3 mg  3 mg Oral QHS Benay Pike, MD   3 mg at 05/06/20 2103  . mupirocin ointment (BACTROBAN) 2 % 1 application  1 application Nasal BID Axel Filler, MD   1 application at 67/54/49 2104  . polyethylene glycol (MIRALAX / GLYCOLAX) packet 17 g  17 g Oral Daily PRN Lilland, Alana, DO      . remdesivir 100 mg in sodium chloride 0.9 % 100 mL IVPB  100 mg Intravenous Daily Lilland, Alana, DO 200 mL/hr at 05/07/20 0810 100 mg at 05/07/20 0810  . sodium chloride flush (NS) 0.9 % injection 10-40 mL  10-40 mL Intracatheter Q12H Lilland, Alana, DO   10 mL at 05/07/20 0814  . sodium chloride flush (NS) 0.9 % injection 10-40 mL  10-40 mL Intracatheter PRN Lilland, Alana, DO      . sodium chloride flush (NS) 0.9 % injection 3 mL  3 mL Intravenous Q12H Lilland, Alana, DO   3 mL at 05/07/20 0814  . sodium chloride flush (NS) 0.9 % injection 3 mL  3 mL Intravenous PRN Lilland, Alana, DO       Facility-Administered Medications Ordered in Other Encounters  Medication Dose Route Frequency Provider Last Rate Last Admin  . sodium chloride flush (NS) 0.9 % injection 10 mL  10 mL Intravenous PRN Magrinat, Virgie Dad, MD         Discharge Medications: Please see discharge summary for a list of discharge medications.  Relevant Imaging Results:  Relevant Lab Results:   Additional Information SSN: [id number]  Hyman Hopes,  RN

## 2020-05-08 LAB — CBC WITH DIFFERENTIAL/PLATELET
Abs Immature Granulocytes: 0.04 10*3/uL (ref 0.00–0.07)
Basophils Absolute: 0 10*3/uL (ref 0.0–0.1)
Basophils Relative: 0 %
Eosinophils Absolute: 0 10*3/uL (ref 0.0–0.5)
Eosinophils Relative: 0 %
HCT: 44.5 % (ref 36.0–46.0)
Hemoglobin: 14.3 g/dL (ref 12.0–15.0)
Immature Granulocytes: 0 %
Lymphocytes Relative: 14 %
Lymphs Abs: 1.4 10*3/uL (ref 0.7–4.0)
MCH: 27.4 pg (ref 26.0–34.0)
MCHC: 32.1 g/dL (ref 30.0–36.0)
MCV: 85.4 fL (ref 80.0–100.0)
Monocytes Absolute: 0.8 10*3/uL (ref 0.1–1.0)
Monocytes Relative: 8 %
Neutro Abs: 7.4 10*3/uL (ref 1.7–7.7)
Neutrophils Relative %: 78 %
Platelets: 384 10*3/uL (ref 150–400)
RBC: 5.21 MIL/uL — ABNORMAL HIGH (ref 3.87–5.11)
RDW: 13.8 % (ref 11.5–15.5)
WBC: 9.6 10*3/uL (ref 4.0–10.5)
nRBC: 0 % (ref 0.0–0.2)

## 2020-05-08 LAB — COMPREHENSIVE METABOLIC PANEL
ALT: 50 U/L — ABNORMAL HIGH (ref 0–44)
AST: 43 U/L — ABNORMAL HIGH (ref 15–41)
Albumin: 2.4 g/dL — ABNORMAL LOW (ref 3.5–5.0)
Alkaline Phosphatase: 138 U/L — ABNORMAL HIGH (ref 38–126)
Anion gap: 8 (ref 5–15)
BUN: 17 mg/dL (ref 8–23)
CO2: 33 mmol/L — ABNORMAL HIGH (ref 22–32)
Calcium: 8.9 mg/dL (ref 8.9–10.3)
Chloride: 98 mmol/L (ref 98–111)
Creatinine, Ser: 0.74 mg/dL (ref 0.44–1.00)
GFR calc Af Amer: >60 mL/min (ref >60)
GFR calc non Af Amer: >60 mL/min (ref >60)
Glucose, Bld: 161 mg/dL — ABNORMAL HIGH (ref 70–99)
Potassium: 4.4 mmol/L (ref 3.5–5.1)
Sodium: 139 mmol/L (ref 135–145)
Total Bilirubin: 0.6 mg/dL (ref 0.3–1.2)
Total Protein: 6.8 g/dL (ref 6.5–8.1)

## 2020-05-08 LAB — D-DIMER, QUANTITATIVE: D-Dimer, Quant: 1.81 ug/mL-FEU — ABNORMAL HIGH (ref 0.00–0.50)

## 2020-05-08 LAB — GLUCOSE, CAPILLARY
Glucose-Capillary: 156 mg/dL — ABNORMAL HIGH (ref 70–99)
Glucose-Capillary: 325 mg/dL — ABNORMAL HIGH (ref 70–99)
Glucose-Capillary: 242 mg/dL — ABNORMAL HIGH (ref 70–99)
Glucose-Capillary: 273 mg/dL — ABNORMAL HIGH (ref 70–99)
Glucose-Capillary: 345 mg/dL — ABNORMAL HIGH (ref 70–99)

## 2020-05-08 LAB — C-REACTIVE PROTEIN: CRP: 3.7 mg/dL — ABNORMAL HIGH (ref <1.0)

## 2020-05-08 MED ORDER — IBUPROFEN 400 MG PO TABS
400.0000 mg | ORAL_TABLET | Freq: Four times a day (QID) | ORAL | Status: DC | PRN
  Administered 2020-05-08 – 2020-05-10 (×4): 400 mg via ORAL
  Filled 2020-05-08 (×5): qty 1

## 2020-05-08 MED ORDER — INSULIN ASPART 100 UNIT/ML ~~LOC~~ SOLN
0.0000 [IU] | Freq: Three times a day (TID) | SUBCUTANEOUS | Status: DC
  Administered 2020-05-08: 11 [IU] via SUBCUTANEOUS
  Administered 2020-05-08: 7 [IU] via SUBCUTANEOUS
  Administered 2020-05-09: 4 [IU] via SUBCUTANEOUS
  Administered 2020-05-09: 7 [IU] via SUBCUTANEOUS
  Administered 2020-05-09: 20 [IU] via SUBCUTANEOUS
  Administered 2020-05-10 (×2): 11 [IU] via SUBCUTANEOUS
  Administered 2020-05-10: 7 [IU] via SUBCUTANEOUS

## 2020-05-08 MED ORDER — DEXAMETHASONE 6 MG PO TABS
6.0000 mg | ORAL_TABLET | Freq: Every day | ORAL | Status: DC
  Administered 2020-05-09 – 2020-05-10 (×2): 6 mg via ORAL
  Filled 2020-05-08 (×2): qty 1

## 2020-05-08 MED ORDER — INSULIN ASPART 100 UNIT/ML ~~LOC~~ SOLN
7.0000 [IU] | Freq: Three times a day (TID) | SUBCUTANEOUS | Status: DC
  Administered 2020-05-08 – 2020-05-10 (×8): 7 [IU] via SUBCUTANEOUS

## 2020-05-08 NOTE — Progress Notes (Signed)
Physical Therapy Treatment Patient Details Name: [name] MRN: [id number] DOB: 04/06/1951 Today's Date: 05/08/2020    History of Present Illness Pt is 69 yo female who presented with [facility] and found to have COVID 19.  Admitted with acute resp failure due to COVID 19 PNE.  Pt with PMH including T2DM, HTN, metastatic breast cancer on chemo, COPD on 3L    PT Comments    Pt making good progress today.  She was able to ambulate short distance in the room with RW.  She was on 10 L HFNC with sats down to 84% requiring 3 mins to recover.  Required frequent cues for breathing techniques.  Educated and performed exercises to improve posture/lung filling.  She does have chronic low back pain -unable to tolerate long distance ambulation and lumbar extension increased pain. 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 Performed gentle AAROM to shoulder. Cont POC.   Follow Up Recommendations  SNF     Equipment Recommendations  None recommended by PT    Recommendations for Other Services       Precautions / Restrictions Precautions Precautions: Fall Precaution Comments: monitor sats and HR    Mobility  Bed Mobility               General bed mobility comments: up in chair when PT arrived (sleeps in chair at home)  Transfers Overall transfer level: Needs assistance Equipment used: Rolling walker (2 wheeled) Transfers: Sit to/from Stand Sit to Stand: Min guard         General transfer comment: Min Guard A for safety; required increased time; performed x 4 throughout session  Ambulation/Gait Ambulation/Gait assistance: Min guard Gait Distance (Feet): 20 Feet Assistive device: Rolling walker (2 wheeled) Gait Pattern/deviations: Step-through pattern;Decreased stride length;Wide base of support;Antalgic Gait velocity: decreased   General Gait Details: limited due to fatigue  and chronic back and knee pain; min cues for RW adn posture; assist with lines/ leads   Stairs             Wheelchair Mobility    Modified Rankin (Stroke Patients Only)       Balance Overall balance assessment: Needs assistance Sitting-balance support: No upper extremity supported;Feet supported Sitting balance-Leahy Scale: Good     Standing balance support: Bilateral upper extremity supported Standing balance-Leahy Scale: Poor Standing balance comment: required RW durign ambulation                            Cognition Arousal/Alertness: Awake/alert Behavior During Therapy: WFL for tasks assessed/performed Overall Cognitive Status: Within Functional Limits for tasks assessed                                        Exercises General Exercises - Lower Extremity Ankle Circles/Pumps: AROM;Both;10 reps;Seated Long Arc Quad: AROM;Both;10 reps;Seated Hip Flexion/Marching: AROM;Both;10 reps;Seated Other Exercises Other Exercises: Education and cues for purse lip breathing- calm and slow Other Exercises: bil scapular retraction and deep breathing 10x2  Also, had c/o L shoulder stiffness - performed gentle AAROM    General Comments General comments (skin integrity, edema, etc.): Pt on 10 L HFNC with sats 94% rest, dropped to 84% activity, requiring cues for pursed lip breathing/focus on breathing (not talking)/and 3 min rest break to recover.  Pertinent Vitals/Pain Pain Assessment: Faces Faces Pain Scale: Hurts a little bit Pain Location: Chronic back, L knee, L shoulder Pain Descriptors / Indicators: Aching Pain Intervention(s): Limited activity within patient's tolerance;Monitored during session;Repositioned    Home Living                      Prior Function            PT Goals (current goals can now be found in the care plan section) Acute Rehab PT Goals Patient Stated Goal: Get back to home and feeling normal PT Goal  Formulation: With patient Time For Goal Achievement: 05/11/20 Potential to Achieve Goals: Good Progress towards PT goals: Progressing toward goals    Frequency    Min 3X/week      PT Plan Discharge plan needs to be updated (Noted CIR has signed off)    Co-evaluation              AM-PAC PT "6 Clicks" Mobility   Outcome Measure  Help needed turning from your back to your side while in a flat bed without using bedrails?: None Help needed moving from lying on your back to sitting on the side of a flat bed without using bedrails?: A Little Help needed moving to and from a bed to a chair (including a wheelchair)?: A Little Help needed standing up from a chair using your arms (e.g., wheelchair or bedside chair)?: A Little Help needed to walk in hospital room?: A Little Help needed climbing 3-5 steps with a railing? : A Lot 6 Click Score: 18    End of Session Equipment Utilized During Treatment: Gait belt;Oxygen Activity Tolerance: Patient limited by fatigue Patient left: in chair;with call bell/phone within reach Nurse Communication: Mobility status PT Visit Diagnosis: Unsteadiness on feet (R26.81);Muscle weakness (generalized) (M62.81);Difficulty in walking, not elsewhere classified (R26.2);Other (comment)     Time: 1140-1210 PT Time Calculation (min) (ACUTE ONLY): 30 min  Charges:  $Gait Training: 8-22 mins $Therapeutic Exercise: 8-22 mins                     Abran Richard, PT Acute Rehab Services Pager [phone] [facility] [place] 05/08/2020, 12:59 PM

## 2020-05-08 NOTE — Progress Notes (Addendum)
Family Medicine Teaching Service Daily Progress Note Intern Pager: [phone]  Patient name: [name] Medical record number: [id number] Date of birth: 1951-03-30 Age: 69 y.o. Gender: female  Primary Care Provider: [name], MD Consultants: None Code Status: Full  Pt Overview and Major Events to Date:  Admitted on 9/16  Assessment and Plan:  [name] a 69 y.o.femalepresenting with worsening shortness of breath and COVID-19. PMH is significant for T2DM, HTN, metastatic breast cancer on chemo, COPD on 3L, recent COVID diagnosis.   Shortness of breath 2/2 COVID pneumonia Able to speak in complete sentencesand have a complete conversation without drop in O2 sat..Currently on11 HFNCwith O2 sat at94%. SNF can accept a patient once stable at 6 L/min. -Tylenol 650mg  q6h scheduled -Baracitinib 4 mg for 14 days (9/17- -Finished Remdesivir 200mg  x1 followed by 100mg  daily x4 days(9/17-9/21) -Switched from IV to oral today Dexamethasone 6mg  x10 days(9/17- -Supplemental O2as needed for O2 goal 90-94%   T2DM A1C9.6; Home medications include Lantus 28U QHS, Humalog 10U TID with meals. Her CBG's have been better controled but still not adequately. CBG's have been in high 200's. Increase Novolog to 7 units with meals. -rSSI -CBGs TID with meals and QHS -Novolog 7 units TID with meals -Lantus30at 10 AM -Stopped Linagliptin -Continue to monitor and adjust medications as necessary   Sialoadenitis: Was seen by Dr. Blenda Moss ENT at [facility] in 12/20. Reports pain similar to this episode started last night. There is no swelling or warmth but it is sore and has jaw pain along Right lower mandible. She reports that she was told sucking on lemons would help with this. -Changed diet order to include lemons -Warm compress ordered   COPD Patient has COPD with baseline 3L O2 at home. Home meds also include albuterol. -Continue albuterol  inhaler   Anxiety: Able to have conversation without becoming tearful.Long standing issue with anxiety.She reports that her anxiety is increasing with the onset of her new jaw pain. Will attempt to resolve jaw pain but may use single dose of other medication if this continues to worsen. -Continue Vistaril to 50 mg TID   HTN Not taking any medications currently. Patient was previouslyonamlodipine but is no longer taking it. Last visit with PCP in March 2021 recommended possible ARB. -Continue to monitor -If needed start ARB   Malignant breast cancer Patient is currently followed by oncology. Home meds include letrozole 2.5mg  daily. -Continue home letrozole if on formulary   Osteoarthritis of spine Patient has chronic low back pain with possible neuropathic symptoms of burning/stinging. Home medications include Gabapentin reportedly being taken at home as 300mg  PRN for pain, hold at the moment unless patient complains of pain and then consider restarting.  -ContinueGabapentin 300 mg daily PRN -K pad  FEN/GI: Carb Modified/Heart Healthy PPx: Lovenox  Disposition: Med-Surg  Prior to Admission Living Arrangement: Home Anticipated Discharge Location: SNF Barriers to Discharge: Oxygen Requirements Anticipated discharge in approximately 2-5 day(s).   Subjective:  Interviewed patient at bedside.   She reports that she has new onset jaw pain over night and reduced saliva production, and that this has happened before. She reports that she has been seen by ENT before for this problem. She reports that she has attempted to massage her jaw to improve saliva production. She reports told that she was instructed to suck on a lemon to help. She reports that her anxiety is greatly increased due to her jaw pain and headache.   Objective: Temp:  [98.1  F (36.7 C)-99.3 F (37.4 C)] 98.1 F (36.7 C) (09/21 0455) Pulse Rate:  [62-63] 63 (09/21 0455) Resp:  [16-20] 20 (09/21  0455) BP: (148-171)/(69-72) 148/72 (09/21 0455) SpO2:  [92 %-98 %] 98 % (09/21 0600) Physical Exam:  Physical Exam Vitals and nursing note reviewed.  Constitutional:      General: She is in acute distress.     Appearance: Normal appearance. She is obese. She is not ill-appearing or toxic-appearing.  HENT:     Head: Normocephalic and atraumatic.  Cardiovascular:     Rate and Rhythm: Regular rhythm. Bradycardia present.     Pulses:          Radial pulses are 2+ on the right side and 2+ on the left side.       Dorsalis pedis pulses are 2+ on the right side and 2+ on the left side.     Heart sounds: Normal heart sounds, S1 normal and S2 normal.  Pulmonary:     Effort: Pulmonary effort is normal. No respiratory distress.     Breath sounds: Normal breath sounds. No wheezing.  Abdominal:     General: There is no distension.     Tenderness: There is no abdominal tenderness.  Musculoskeletal:     Right lower leg: 1+ Pitting Edema present.     Left lower leg: 1+ Pitting Edema present.  Neurological:     Mental Status: She is alert. Mental status is at baseline.  Psychiatric:        Attention and Perception: Attention normal.        Mood and Affect: Mood is anxious.        Speech: Speech normal.        Behavior: Behavior normal.      Laboratory: Recent Labs  Lab 05/06/20 0416 05/07/20 0351 05/08/20 0328  WBC 5.7 7.1 9.6  HGB 12.5 13.9 14.3  HCT 39.7 44.0 44.5  PLT 256 359 384   Recent Labs  Lab 05/06/20 0416 05/07/20 0351 05/08/20 0328  NA 137 138 139  K 4.2 4.7 4.4  CL 96* 98 98  CO2 33* 32 33*  BUN 25* 22 17  CREATININE 0.68 0.64 0.74  CALCIUM 8.8* 8.9 8.9  PROT 6.6 6.5 6.8  BILITOT 0.3 0.6 0.6  ALKPHOS 98 124 138*  ALT 38 48* 50*  AST 28 53* 43*  GLUCOSE 244* 202* 161*    CRP: 3.7 (High) D-Dimer: 1.81 (High)  Imaging/Diagnostic Tests:   CT Angio Chest PE W and/or WO Contrast:Negative examination for pulmonary embolism. Extensive bilateral, somewhat  geographic heterogeneous and ground-glass airspace opacity, consistent with multifocal infection, including COVID-19 if clinically suspected. Small pulmonary nodules in the lung apices seen on prior examination are mostly obscured by airspace disease, visualized nodules are however unchanged. Attention on follow-up. Unchanged prominent mediastinal lymph nodes. Attention on follow-up.   Vas [place] Lower Extremity Venous: RIGHT:  - There is no evidence of deep vein thrombosis in the lower extremity.  However, portions of this examination were limited- see technologist  comments above.  - No cystic structure found in the popliteal fossa.   LEFT:  - There is no evidence of deep vein thrombosis in the lower extremity.  However, portions of this examination were limited- see technologist  comments above.  - No cystic structure found in the popliteal fossa.    DG Chest Portable 1 View:Diffusely increased interstitial markings seen throughout both lungs likely consistent with atelectasis and/or infectious etiology   Lestine Mount  Chauncey Cruel, MD 05/08/2020, 7:38 AM PGY-1, [place] Intern pager: [phone], text pages welcome

## 2020-05-09 DIAGNOSIS — K1120 Sialoadenitis, unspecified: Secondary | ICD-10-CM

## 2020-05-09 LAB — COMPREHENSIVE METABOLIC PANEL
ALT: 59 U/L — ABNORMAL HIGH (ref 0–44)
AST: 55 U/L — ABNORMAL HIGH (ref 15–41)
Albumin: 2.6 g/dL — ABNORMAL LOW (ref 3.5–5.0)
Alkaline Phosphatase: 161 U/L — ABNORMAL HIGH (ref 38–126)
Anion gap: 10 (ref 5–15)
BUN: 16 mg/dL (ref 8–23)
CO2: 30 mmol/L (ref 22–32)
Calcium: 9.1 mg/dL (ref 8.9–10.3)
Chloride: 97 mmol/L — ABNORMAL LOW (ref 98–111)
Creatinine, Ser: 0.74 mg/dL (ref 0.44–1.00)
GFR calc Af Amer: >60 mL/min (ref >60)
GFR calc non Af Amer: >60 mL/min (ref >60)
Glucose, Bld: 202 mg/dL — ABNORMAL HIGH (ref 70–99)
Potassium: 4.5 mmol/L (ref 3.5–5.1)
Sodium: 137 mmol/L (ref 135–145)
Total Bilirubin: 0.9 mg/dL (ref 0.3–1.2)
Total Protein: 7.2 g/dL (ref 6.5–8.1)

## 2020-05-09 LAB — GLUCOSE, CAPILLARY
Glucose-Capillary: 194 mg/dL — ABNORMAL HIGH (ref 70–99)
Glucose-Capillary: 351 mg/dL — ABNORMAL HIGH (ref 70–99)
Glucose-Capillary: 216 mg/dL — ABNORMAL HIGH (ref 70–99)
Glucose-Capillary: 314 mg/dL — ABNORMAL HIGH (ref 70–99)

## 2020-05-09 LAB — C-REACTIVE PROTEIN: CRP: 6.5 mg/dL — ABNORMAL HIGH (ref <1.0)

## 2020-05-09 LAB — AMYLASE: Amylase: 70 U/L (ref 28–100)

## 2020-05-09 LAB — D-DIMER, QUANTITATIVE: D-Dimer, Quant: 3.16 ug/mL-FEU — ABNORMAL HIGH (ref 0.00–0.50)

## 2020-05-09 MED ORDER — HYDROXYZINE HCL 25 MG PO TABS
50.0000 mg | ORAL_TABLET | Freq: Three times a day (TID) | ORAL | Status: DC
  Administered 2020-05-09 – 2020-05-10 (×4): 50 mg via ORAL
  Filled 2020-05-09 (×4): qty 2

## 2020-05-09 MED ORDER — TRAMADOL HCL 50 MG PO TABS
50.0000 mg | ORAL_TABLET | Freq: Once | ORAL | Status: AC
  Administered 2020-05-09: 50 mg via ORAL
  Filled 2020-05-09: qty 1

## 2020-05-09 MED ORDER — CLINDAMYCIN HCL 300 MG PO CAPS
450.0000 mg | ORAL_CAPSULE | Freq: Three times a day (TID) | ORAL | Status: DC
  Administered 2020-05-09 – 2020-05-10 (×4): 450 mg via ORAL
  Filled 2020-05-09 (×6): qty 1

## 2020-05-09 MED ORDER — HYDROXYZINE HCL 25 MG PO TABS
50.0000 mg | ORAL_TABLET | Freq: Four times a day (QID) | ORAL | Status: DC
  Administered 2020-05-09: 50 mg via ORAL
  Filled 2020-05-09: qty 2

## 2020-05-09 MED ORDER — CIPROFLOXACIN HCL 500 MG PO TABS
500.0000 mg | ORAL_TABLET | Freq: Two times a day (BID) | ORAL | Status: DC
  Administered 2020-05-09 – 2020-05-10 (×3): 500 mg via ORAL
  Filled 2020-05-09 (×3): qty 1

## 2020-05-09 MED ORDER — INSULIN GLARGINE 100 UNIT/ML ~~LOC~~ SOLN
35.0000 [IU] | Freq: Every day | SUBCUTANEOUS | Status: DC
  Administered 2020-05-10: 35 [IU] via SUBCUTANEOUS
  Filled 2020-05-09 (×2): qty 0.35

## 2020-05-09 NOTE — Progress Notes (Signed)
Family Medicine Teaching Service Daily Progress Note Intern Pager: [phone]  Patient name: [name] Medical record number: [id number] Date of birth: [birth year] Age: 69 y.o. Gender: female  Primary Care Provider: [name], MD Consultants: None Code Status: Full  Pt Overview and Major Events to Date:  Admitted on 9/16  Assessment and Plan:   [name] a 69 y.o.femalepresenting with worsening shortness of breath and COVID-19. PMH is significant for T2DM, HTN, metastatic breast cancer on chemo, COPD on 3L, recent COVID diagnosis.   Shortness of breath 2/2 COVID pneumonia Able to speak in complete sentencesand have a complete conversation without drop in O2 sat..Currently on8 L HFNCwith O2 sat at94%. SNF can accept a patient once stable at or below 6 L/min. -Tylenol 650mg  q6hscheduled -Baracitinib 4 mg for 14 days (9/17- -Finished Remdesivir(9/17-9/21) -Oral Dexamethasone 6mg  x10 days(9/17- -Supplemental O2as needed for O2 goal 90-94%   T2DM A1C9.6; Home medications include Lantus 28U QHS, Humalog 10U TID with meals.Her CBG's have been better controled but still not adequately. CBG's have been ranging in the 200's with still one in the 300's. Continue Novolog 7 units with meals. Increase Lantus to 35 units. -rSSI -CBGs TID with meals and QHS -Novolog 7 units TID with meals -Increased Lantus35at 10 AM -StoppedLinagliptin -Continue to monitor and adjust medications as necessary   Sialoadenitis: Was seen by Dr. Blenda Nicely ENT at [facility] in 12/20. Pain started at night on 9/20. She reports that warm compresses and lemons have not helped. Could obtain US or CT to further diagnose. Will start antibiotics. -Start oral Clindamycin 450 mg q8 -Start oral Ciprofloxacin 500 mg BID -Continue sucking on lemons -Continue warm compress   COPD Patient has COPD with baseline 3L O2 at home. Home meds also include albuterol. -Continue  albuterol inhaler   Anxiety: Able to have conversation without becoming tearful.Long standing issue with anxiety.Reports that her anxiety has continued to worsen. Will continue to monitor and hope it will improve with treatment of Sialoadenitis. -ContinueVistaril to 50mg  TID   HTN Not taking any medications currently. Patient was previouslyonamlodipine but is no longer taking it. Last visit with PCP in March 2021 recommended possible ARB. -Continue to monitor -If needed start ARB   Malignant breast cancer Patient is currently followed by oncology. Home meds include letrozole 2.5mg  daily. -Continue home letrozole if on formulary   Osteoarthritis of spine Patient has chronic low back pain with possible neuropathic symptoms of burning/stinging. Home medications include Gabapentin reportedly being taken at home as 300mg  PRN for pain, hold at the moment unless patient complains of pain and then consider restarting.  -ContinueGabapentin 300 mg daily PRN -K pad   FEN/GI: Carb Modified/Heart Healthy with Lemon PPx: Lovenox  Disposition: Med-Surg  Prior to Admission Living Arrangement: Home Anticipated Discharge Location: SNF Barriers to Discharge: Oxygen Requirements Anticipated discharge in approximately 2-5 day(s).   Subjective:  Interviewed patient at chairside.  She states that the pain in her jaw has become worse. She states that the warm compresses and lemons have not helped. She reports her anxiety has worsened. She reports that her headache has not been helped by the Ibuprofen.  Objective: Temp:  [97.8 F (36.6 C)-98.7 F (37.1 C)] 98.6 F (37 C) (09/22 0506) Pulse Rate:  [57-65] 57 (09/22 0506) Resp:  [16-20] 16 (09/22 0506) BP: (124-160)/(56-77) 124/77 (09/22 0506) SpO2:  [90 %-98 %] 90 % (09/22 0506) Physical Exam:  Physical Exam Vitals and nursing note reviewed.  Constitutional:  General: She is not in acute distress.    Appearance: Normal  appearance. She is obese. She is not ill-appearing or toxic-appearing.  HENT:     Head: Normocephalic and atraumatic.     Jaw: Tenderness, swelling and pain on movement present.     Salivary Glands: Right salivary gland is diffusely enlarged.      Comments: Erythema, tenderness, warmth    Mouth/Throat:      Comments: Erythema Cardiovascular:     Rate and Rhythm: Regular rhythm. Bradycardia present.     Pulses: Normal pulses.          Radial pulses are 2+ on the right side and 2+ on the left side.       Dorsalis pedis pulses are 2+ on the right side and 2+ on the left side.     Heart sounds: Normal heart sounds, S1 normal and S2 normal. No murmur heard.   Pulmonary:     Effort: Pulmonary effort is normal. No respiratory distress.     Breath sounds: Wheezing present.  Musculoskeletal:     Right lower leg: 1+ Pitting Edema present.     Left lower leg: 1+ Pitting Edema present.  Neurological:     Mental Status: She is alert. Mental status is at baseline.  Psychiatric:        Behavior: Behavior normal.        Thought Content: Thought content normal.      Laboratory: Recent Labs  Lab 05/06/20 0416 05/07/20 0351 05/08/20 0328  WBC 5.7 7.1 9.6  HGB 12.5 13.9 14.3  HCT 39.7 44.0 44.5  PLT 256 359 384   Recent Labs  Lab 05/07/20 0351 05/08/20 0328 05/09/20 0430  NA 138 139 137  K 4.7 4.4 4.5  CL 98 98 97*  CO2 32 33* 30  BUN 22 17 16   CREATININE 0.64 0.74 0.74  CALCIUM 8.9 8.9 9.1  PROT 6.5 6.8 7.2  BILITOT 0.6 0.6 0.9  ALKPHOS 124 138* 161*  ALT 48* 50* 59*  AST 53* 43* 55*  GLUCOSE 202* 161* 202*    CRP: 6.5 (high) D-Dimer: 3.16 (high)   Imaging/Diagnostic Tests:   CT Angio Chest PE W and/or WO Contrast:Negative examination for pulmonary embolism. Extensive bilateral, somewhat geographic heterogeneous and ground-glass airspace opacity, consistent with multifocal infection, including COVID-19 if clinically suspected. Small pulmonary nodules in the lung  apices seen on prior examination are mostly obscured by airspace disease, visualized nodules are however unchanged. Attention on follow-up. Unchanged prominent mediastinal lymph nodes. Attention on follow-up.   Vas [place] Lower Extremity Venous: RIGHT:  - There is no evidence of deep vein thrombosis in the lower extremity.  However, portions of this examination were limited- see technologist  comments above.  - No cystic structure found in the popliteal fossa.   LEFT:  - There is no evidence of deep vein thrombosis in the lower extremity.  However, portions of this examination were limited- see technologist  comments above.  - No cystic structure found in the popliteal fossa.    DG Chest Portable 1 View:Diffusely increased interstitial markings seen throughout both lungs likely consistent with atelectasis and/or infectious etiology  Briant Cedar, MD 05/09/2020, 5:52 AM PGY-1, [place] Intern pager: [phone], text pages welcome

## 2020-05-09 NOTE — Consult Note (Signed)
   [facility] Inpatient Consult   05/09/2020  BLUE RUGGERIO Mar 18, 1951 [id number]   [place] [facility] [[facility]] Patient: [employer] Primary Care Provider: [name], MD,  [place], [place] practice  Patient was screened for [place] Management services. Patient will have the transition of care call conducted by the primary care provider. This patient could likely benefit from  Embedded Care Management team for post hospital follow up and Diabetes with A1C noted as 9.6. Plan: Will follow and  Send notification to the [place] Management team for potential chronic care management needs.  Please contact for further questions,  Natividad Brood, RN BSN [facility] Liaison  [phone] business mobile phone Toll free office [phone]  Fax number: [phone] [email or web address]

## 2020-05-10 LAB — CBC
HCT: 44.2 % (ref 36.0–46.0)
Hemoglobin: 14 g/dL (ref 12.0–15.0)
MCH: 26.8 pg (ref 26.0–34.0)
MCHC: 31.7 g/dL (ref 30.0–36.0)
MCV: 84.7 fL (ref 80.0–100.0)
Platelets: 405 10*3/uL — ABNORMAL HIGH (ref 150–400)
RBC: 5.22 MIL/uL — ABNORMAL HIGH (ref 3.87–5.11)
RDW: 14 % (ref 11.5–15.5)
WBC: 8 10*3/uL (ref 4.0–10.5)
nRBC: 0 % (ref 0.0–0.2)

## 2020-05-10 LAB — BASIC METABOLIC PANEL
Anion gap: 9 (ref 5–15)
BUN: 19 mg/dL (ref 8–23)
CO2: 31 mmol/L (ref 22–32)
Calcium: 8.8 mg/dL — ABNORMAL LOW (ref 8.9–10.3)
Chloride: 98 mmol/L (ref 98–111)
Creatinine, Ser: 0.76 mg/dL (ref 0.44–1.00)
GFR calc Af Amer: >60 mL/min (ref >60)
GFR calc non Af Amer: >60 mL/min (ref >60)
Glucose, Bld: 196 mg/dL — ABNORMAL HIGH (ref 70–99)
Potassium: 4.6 mmol/L (ref 3.5–5.1)
Sodium: 138 mmol/L (ref 135–145)

## 2020-05-10 LAB — GLUCOSE, CAPILLARY
Glucose-Capillary: 272 mg/dL — ABNORMAL HIGH (ref 70–99)
Glucose-Capillary: 222 mg/dL — ABNORMAL HIGH (ref 70–99)
Glucose-Capillary: 259 mg/dL — ABNORMAL HIGH (ref 70–99)

## 2020-05-10 MED ORDER — CLINDAMYCIN HCL 150 MG PO CAPS: 450.0000 mg | ORAL_CAPSULE | Freq: Three times a day (TID) | ORAL | 0 refills | Status: AC

## 2020-05-10 MED ORDER — HEPARIN SOD (PORK) LOCK FLUSH 100 UNIT/ML IV SOLN
500.0000 [IU] | INTRAVENOUS | Status: AC | PRN
  Administered 2020-05-10: 500 [IU]
  Filled 2020-05-10: qty 5

## 2020-05-10 MED ORDER — CIPROFLOXACIN HCL 500 MG PO TABS
500.0000 mg | ORAL_TABLET | Freq: Two times a day (BID) | ORAL | 0 refills | Status: DC
Start: 2020-05-10 — End: 2020-07-11

## 2020-05-10 MED ORDER — LANTUS SOLOSTAR 100 UNIT/ML ~~LOC~~ SOPN: 28.0000 [IU] | PEN_INJECTOR | Freq: Every day | SUBCUTANEOUS | Status: DC

## 2020-05-10 MED ORDER — DEXAMETHASONE 6 MG PO TABS
6.0000 mg | ORAL_TABLET | Freq: Every day | ORAL | 0 refills | Status: DC
Start: 2020-05-11 — End: 2020-07-11

## 2020-05-10 NOTE — Discharge Summary (Addendum)
[facility] Discharge Summary  Patient name: [name] Medical record number: [id number] Date of birth: [birth year] Age: 69 y.o. Gender: female Date of Admission: 05/03/2020  Date of Discharge: 05/10/2020 Admitting Physician: Rise Patience, DO  Primary Care Provider: [name], MD Consultants: None  Indication for Hospitalization: Acute Respiratory Failure 2/2 COVID Pneumonia  Discharge Diagnoses/Problem List:  Acute Respiratory Failure 2/2 COVID Pneumonia Sialoadenitis T2DM COPD Anxiety  Disposition: to Home  Discharge Condition: stable  Discharge Exam: Physical Exam Vitals and nursing note reviewed.  Constitutional:      General: She is not in acute distress.    Appearance: Normal appearance. She is obese. She is not ill-appearing, toxic-appearing or diaphoretic.  HENT:     Head: Normocephalic and atraumatic.  Cardiovascular:     Rate and Rhythm: Regular rhythm. Bradycardia present.     Pulses: Normal pulses.          Radial pulses are 2+ on the right side and 2+ on the left side.       Dorsalis pedis pulses are 2+ on the right side and 2+ on the left side.     Heart sounds: Normal heart sounds, S1 normal and S2 normal. No murmur heard.   Pulmonary:     Effort: Pulmonary effort is normal. No respiratory distress.     Breath sounds: Normal breath sounds. No wheezing.  Musculoskeletal:     Right lower leg: 1+ Pitting Edema present.     Left lower leg: 1+ Pitting Edema present.  Neurological:     Mental Status: She is alert. Mental status is at baseline.  Psychiatric:        Mood and Affect: Mood normal.        Behavior: Behavior normal.        Thought Content: Thought content normal.    Brief Hospital Course:  Acute Respiratory Failure 2/2 COVID Pneumonia: Presented to the ED with increasing SOB, cough, fever, and diarrhea since 9/6. COVID test was positive on 9/14. Patient has history of COPD with 3L O2 at baseline, but  presented with desaturation to the 70s and required 6L [place]. Physical exam significant for tachypnea, crackles in all lung fields, b/l LE swelling. CTA was negative for pulmonary embolism, but showed extensive b/l ground-glass opacity consistent with multifocal infection including COVID-19. She was started on remdesivir and dexamethasone on 9/16. On 9/18 her oxygen demand increased to 15 L nasal canula, she was started on baricitinib on 9/19. On 9/23 she was back to her baseline 3-4 L nasal canular and on ambulatory pulse ox dropped to 85% which is her baseline. She was discharged on 3 additional days of dexamethasone to finish the 10 day course. She should quarantine for 21 days since first symptoms, ending on 9/27  Sialoadenitis: On 9/20 began to have pain and warmth on the right side of her lower jaw with some ear pain as well. She reported having similar episodes in the past and had been seen by [facility] and been instructed to suck on lemons to unblock her salivary gland. On 9/21 was given warm compresses and lemons. This did not resolve her symptoms and continued to worsen. On 9/22 she was started on oral antibiotics- Clindamycin 450 mg q8 and Ciprofloxacin 500 mg BID. She had rapid improvement by 9/23. She was discharged with 3 additional days of both antibiotics to complete a 5 day course.  T2DM: Her home medications include Lantus 28U QHS, Humalog 10U TID  with meals, she refused Linagliptin as she believed it had caused a UTI in the past. Her A1C was measured to be 9.6%. CBG values have been higher than normal with many values above 400 due to corticosteroids.  She was ultimately discharged on Lantus 35U daily with 10 units Humalog with meals while finishing her steroid course. She will decrease back to lantus 28U (home dose) when steroid course ends and continue meal coverage.   Anxiety: Has had a long standing issue of anxiety. Due to her multiple health issues and the need to be hospitalized  for COVID made her anxiety worse. She would become tearful while talking in the ED. She was started on Vistaril 50 mg PRN q4 for her anxiety.  On 9/22 she reported increased anxiety due to her development of sialoadenitis. With the starting of antibiotics and reduction in pain associated with the sialoadenitis her anxiety improved. Recommend outpatient follow up and referral to therapy for further help.  Issues for Follow Up:  1. Acute Respiratory Failure 2/2 COVID Pneumonia: On discharge was back to baseline oxygen requirement. Ensure that symptoms have continued to improve and oxygen need is at baseline.  2. Sialoadenitis: Ensure completion of antibiotic course. Has established at [facility], may need further outpatient follow up if not improved on antibiotic course.  3. T2DM: A1C is uncontrolled at 9.6%. Recommend PCP follow up to discuss further BG control and ensure she has returned to appropriate lantus dose following end of corticosteroid course.  5. Anxiety: Patient has history of anxiety and does not want to take medication, but accepted using vistaril while hospitalized. Educated with using breathing techniques. Could benefit from referral to therapy if still resistant to medication.  Significant Procedures: None  Significant Labs and Imaging:  Recent Labs  Lab 05/07/20 0351 05/08/20 0328 05/10/20 0447  WBC 7.1 9.6 8.0  HGB 13.9 14.3 14.0  HCT 44.0 44.5 44.2  PLT 359 384 405*   Recent Labs  Lab 05/04/20 0610 05/04/20 0610 05/05/20 0105 05/05/20 0105 05/06/20 0416 05/06/20 0416 05/07/20 0351 05/07/20 0351 05/08/20 0328 05/08/20 0328 05/09/20 0430 05/10/20 0447  NA 137   < > 137   < > 137  --  138  --  139  --  137 138  K 4.2   < > 4.3   < > 4.2   < > 4.7   < > 4.4   < > 4.5 4.6  CL 97*   < > 96*   < > 96*  --  98  --  98  --  97* 98  CO2 31   < > 33*   < > 33*  --  32  --  33*  --  30 31  GLUCOSE 300*   < > 310*   < > 244*  --  202*  --  161*  --  202* 196*   BUN 14   < > 18   < > 25*  --  22  --  17  --  16 19  CREATININE 0.63   < > 0.68   < > 0.68  --  0.64  --  0.74  --  0.74 0.76  CALCIUM 8.2*   < > 8.5*   < > 8.8*  --  8.9  --  8.9  --  9.1 8.8*  MG 1.7  --   --   --   --   --   --   --   --   --   --   --  PHOS 2.1*  --   --   --   --   --   --   --   --   --   --   --   ALKPHOS 103   < > 97  --  98  --  124  --  138*  --  161*  --   AST 43*   < > 30  --  28  --  53*  --  43*  --  55*  --   ALT 44   < > 42  --  38  --  48*  --  50*  --  59*  --   ALBUMIN 2.4*   < > 2.2*  --  2.4*  --  2.3*  --  2.4*  --  2.6*  --    < > = values in this interval not displayed.    CT Angio Chest PE W and/or WO Contrast:Negative examination for pulmonary embolism. Extensive bilateral, somewhat geographic heterogeneous and ground-glass airspace opacity, consistent with multifocal infection, including COVID-19 if clinically suspected. Small pulmonary nodules in the lung apices seen on prior examination are mostly obscured by airspace disease, visualized nodules are however unchanged. Attention on follow-up. Unchanged prominent mediastinal lymph nodes. Attention on follow-up.   Vas [place] Lower Extremity Venous: RIGHT:  - There is no evidence of deep vein thrombosis in the lower extremity.  However, portions of this examination were limited- see technologist  comments above.  - No cystic structure found in the popliteal fossa.   LEFT:  - There is no evidence of deep vein thrombosis in the lower extremity.  However, portions of this examination were limited- see technologist  comments above.  - No cystic structure found in the popliteal fossa.    DG Chest Portable 1 View:Diffusely increased interstitial markings seen throughout both lungs likely consistent with atelectasis and/or infectious etiology   Results/Tests Pending at Time of Discharge: None  Discharge Medications:  Allergies as of 05/10/2020      Reactions   Meperidine Hcl Anaphylaxis    Penicillins Anaphylaxis   Has patient had a PCN reaction causing immediate rash, facial/tongue/throat swelling, SOB or lightheadedness with hypotension: yes Has patient had a PCN reaction causing severe rash involving mucus membranes or skin necrosis: no Has patient had a PCN reaction that required hospitalization yes Has patient had a PCN reaction occurring within the last 10 years: no If all of the above answers are "NO", then may proceed with Cephalosporin use.   Amoxicillin    REACTION: unspecified   Aspirin Nausea And Vomiting   REACTION: unspecified   Percocet [oxycodone-acetaminophen]       Medication List    STOP taking these medications   predniSONE 20 MG tablet Commonly known as: DELTASONE     TAKE these medications   albuterol 108 (90 Base) MCG/ACT inhaler Commonly known as: ProAir HFA INHALE 2 PUFFS INTO THE LUNGS EVERY 6 (SIX) HOURS AS NEEDED FOR WHEEZING. What changed:   how much to take  how to take this  when to take this  reasons to take this  additional instructions   benzonatate 100 MG capsule Commonly known as: TESSALON Take 1 capsule (100 mg total) by mouth 3 (three) times daily as needed for cough. Swallow whole.  Do not chew this capsule.   ciprofloxacin 500 MG tablet Commonly known as: CIPRO Take 1 tablet (500 mg total) by mouth 2 (two) times daily.   clindamycin 150 MG  capsule Commonly known as: CLEOCIN Take 3 capsules (450 mg total) by mouth every 8 (eight) hours for 3 days.   dexamethasone 6 MG tablet Commonly known as: DECADRON Take 1 tablet (6 mg total) by mouth daily. Start taking on: May 11, 2020   gabapentin 300 MG capsule Commonly known as: NEURONTIN May take up to 2 caps in AM and in afternoon and 3 in PM as needed for pain What changed:   how much to take  how to take this  when to take this  reasons to take this  additional instructions   insulin lispro 100 UNIT/ML injection Commonly known as:  HumaLOG Inject 0.1 mLs (10 Units total) into the skin 3 (three) times daily before meals.   Lantus SoloStar 100 UNIT/ML Solostar Pen Generic drug: insulin glargine Inject 28 Units into the skin at bedtime. Please take 35 Units of Lantus for the next 3 days while you are taking your steroids. Once you have finished your steroids resume taking 28 units until you see your primary care provider again. What changed: See the new instructions.   letrozole 2.5 MG tablet Commonly known as: FEMARA Take 1 tablet (2.5 mg total) by mouth daily.   loperamide 2 MG capsule Commonly known as: IMODIUM TAKE 1 CAPSULE BY MOUTH AS NEEDED FOR DIARRHEA OR LOOSE STOOLS. MAX 6 PER DAY What changed: See the new instructions.   ONE TOUCH ULTRA 2 w/Device Kit 1 kit by Does not apply route QID. ICD 10-code: E11.49.   OneTouch Ultra test strip Generic drug: glucose blood USE AS INSTRUCTED TO TEST 3 TIMES DAILY. DX CODE: E11.49   OXYGEN Inhale into the lungs.            Durable Medical Equipment  (From admission, onward)         Start     Ordered   05/10/20 1611  For home use only DME oxygen  Once       Question Answer Comment  Length of Need 6 Months   Mode or (Route) Nasal cannula   Liters per Minute 5   Frequency Continuous (stationary and portable oxygen unit needed)   Oxygen conserving device Yes   Oxygen delivery system Gas      05/10/20 1611          Discharge Instructions: Please refer to Patient Instructions section of EMR for full details.  Patient was counseled important signs and symptoms that should prompt return to medical care, changes in medications, dietary instructions, activity restrictions, and follow up appointments.   Follow-Up Appointments:  Follow-up [place]. Go on 05/14/2020.   Specialty: Family Medicine Why: Please arrive by 10:45 AM for an 11 AM appointment Contact information: [address] [id number] [place] [address] [facility] Follow up.   Contact information: [place] [address] [phone]               Briant Cedar, MD 05/10/2020, 6:42 PM PGY-1, [place]

## 2020-05-10 NOTE — Progress Notes (Signed)
Physical Therapy Treatment Patient Details Name: [name] MRN: [id number] DOB: [birth year] Today's Date: 05/10/2020    History of Present Illness Pt is 69 yo female who presented with [facility] and found to have COVID 19.  Admitted with acute resp failure due to COVID 19 PNE.  Pt with PMH including T2DM, HTN, metastatic breast cancer on chemo, COPD on 3L    PT Comments    Pt making good progress.  She reports she feels that she is near her baseline (limited ambulator and on O2 at home), updated d/c recommendation to home with supervision.  She was able to ambulate 40' x 2 and manage O2 line.  Required min cues for safety and breathing technique.  Cont to advance mobility while hospitalized.     Follow Up Recommendations  Supervision/Assistance - 24 hour;Home health PT (pt declined HHPT)     Equipment Recommendations  None recommended by PT (pt request oxygen tank roller - case manager aware)    Recommendations for Other Services       Precautions / Restrictions Precautions Precautions: Fall Precaution Comments: monitor sats and HR    Mobility  Bed Mobility               General bed mobility comments: up in chair when PT arrived (sleeps in chair at home)  Transfers Overall transfer level: Needs assistance Equipment used: None Transfers: Sit to/from Stand Sit to Stand: Min guard         General transfer comment: Min Guard A for safety; required increased time; performed x 4 throughout session  Ambulation/Gait Ambulation/Gait assistance: Min guard Gait Distance (Feet): 40 Feet (40'x2) Assistive device: None Gait Pattern/deviations: Step-through pattern;Decreased stride length;Wide base of support;Antalgic Gait velocity: decreased   General Gait Details: limited due to chronic back/knee pain; she was able to manage lines; min guard for safety; 5 min rest break   Stairs             Wheelchair Mobility    Modified Rankin (Stroke Patients Only)        Balance Overall balance assessment: Needs assistance Sitting-balance support: No upper extremity supported;Feet supported Sitting balance-Leahy Scale: Good     Standing balance support: No upper extremity supported Standing balance-Leahy Scale: Good                              Cognition Arousal/Alertness: Awake/alert Behavior During Therapy: WFL for tasks assessed/performed Overall Cognitive Status: Within Functional Limits for tasks assessed                                        Exercises      General Comments General comments (skin integrity, edema, etc.): Pt on 4 L HFNC with sats maintaining 89% or > throughout session; min cues for pursed lip breathing and focus on breathing with activity.  Pt educated on energy conservation, breathing exercises, and rest breaks as needed.      Pertinent Vitals/Pain Pain Assessment: 0-10 Pain Score: 2  Pain Location: Chronic back, L knee, L shoulder Pain Descriptors / Indicators: Aching Pain Intervention(s): Limited activity within patient's tolerance;Monitored during session;Repositioned    Home Living                      Prior Function  PT Goals (current goals can now be found in the care plan section) Acute Rehab PT Goals Patient Stated Goal: Get back to home and feeling normal PT Goal Formulation: With patient Time For Goal Achievement: 05/11/20 Potential to Achieve Goals: Good Progress towards PT goals: Progressing toward goals    Frequency    Min 3X/week      PT Plan Discharge plan needs to be updated (pt feels she is near her baseline)    Co-evaluation              AM-PAC PT "6 Clicks" Mobility   Outcome Measure  Help needed turning from your back to your side while in a flat bed without using bedrails?: None Help needed moving from lying on your back to sitting on the side of a flat bed without using bedrails?: A Little Help needed moving to and from a  bed to a chair (including a wheelchair)?: None Help needed standing up from a chair using your arms (e.g., wheelchair or bedside chair)?: None Help needed to walk in hospital room?: None Help needed climbing 3-5 steps with a railing? : A Lot 6 Click Score: 21    End of Session Equipment Utilized During Treatment: Gait belt;Oxygen Activity Tolerance: Patient tolerated treatment well Patient left: in chair;with call bell/phone within reach Nurse Communication: Mobility status PT Visit Diagnosis: Unsteadiness on feet (R26.81);Muscle weakness (generalized) (M62.81);Difficulty in walking, not elsewhere classified (R26.2);Other (comment)     Time: 8242-3536 PT Time Calculation (min) (ACUTE ONLY): 32 min  Charges:  $Gait Training: 8-22 mins $Therapeutic Activity: 8-22 mins                     Abran Richard, PT Acute Rehab Services Pager [phone] [facility] [place] 05/10/2020, 4:38 PM

## 2020-05-10 NOTE — TOC Transition Note (Signed)
Transition of Care [facility]) - CM/SW Discharge Note   Patient Details  Name: [name] MRN: [id number] Date of Birth: 1951-04-14  [facility] CM/SW Contact:  Verdell Carmine, RN Phone Number: 05/10/2020, 4:25 PM   Clinical Narrative:    Patient going home, refusing home health, Oxygen ordered via adapt per patient.  Already has adapt oxygen at 3LPM needs at 5L at this time. Per qualifications. Patient requesting a holder, has been asking for this for 2 months. Called into [employer]. They state they can get a holder to her with the portable tank today. Patient has walker and equipment at home. NO further needs identified.     Final next level of care: Skilled Nursing Facility Barriers to Discharge: No Barriers Identified   Patient Goals and CMS Choice Patient states their goals for this hospitalization and ongoing recovery are:: HOme      Discharge Placement                       Discharge Plan and Services   Discharge Planning Services: CM Consult            DME Arranged: Oxygen DME Agency: [employer] Date DME Agency Contacted: 05/10/20 Time DME Agency Contacted: [phone] Representative spoke with at DME Agency: Andree Coss         Representative spoke with at [place]: Patient refused [place] Determinants of Health (SDOH) Interventions     Readmission Risk Interventions No flowsheet data found.

## 2020-05-10 NOTE — Progress Notes (Signed)
SATURATION QUALIFICATIONS: (This note is used to comply with regulatory documentation for home oxygen)  Patient Saturations on Room Air at Rest = 82%  Patient Saturations on Room Air while Ambulating = 75%  Patient Saturations on 4L Liters of oxygen while Ambulating = 84%  Please briefly explain why patient needs home oxygen: Pt using 3L O2 at home at baseline due to COPD. Currently requiring 4L at rest.

## 2020-05-10 NOTE — Progress Notes (Signed)
Family Medicine Teaching Service Daily Progress Note Intern Pager: [phone]  Patient name: [name] Medical record number: [id number] Date of birth: [birth year] Age: 69 y.o. Gender: female  Primary Care Provider: [name], MD Consultants: None Code Status: Full  Pt Overview and Major Events to Date:  Admitted on 9/16  Assessment and Plan:  [name] a 69 y.o.femalepresenting with worsening shortness of breath and COVID-19. PMH is significant for T2DM, HTN, metastatic breast cancer on chemo, COPD on 3L, recent COVID diagnosis.   Shortness of breath 2/2 COVID pneumonia Able to speak in complete sentencesand have a complete conversation without drop in O2 sat..Currently on8 L HFNCwith O2 sat at94%.SNF can accept a patient once stable at or below 6 L/min. -Tylenol 650mg  q6hscheduled -Baracitinib 4 mg for 14 days (9/17- -FinishedRemdesivir(9/17-9/21) -Oral Dexamethasone 6mg  x10 days(9/17- -Supplemental O2as needed for O2 goal 90-94%   T2DM A1C9.6; Home medications include Lantus 28U QHS, Humalog 10U TID with meals.Her CBG's have been better controled but still not adequately.CBG's have been ranging in the 200's with 2 values in the 300's.Continue Novolog 7 units with meals. Lantus was increased yesterday to 35 units. -rSSI -CBGs TID with meals and QHS -Novolog7units TID with meals -Increased Lantus35at 10 AM -StoppedLinagliptin -Continue to monitor and adjust medications as necessary   Sialoadenitis: Was seen by Dr. Blenda Moss ENT at [facility] in 12/20. Pain started at night on 9/20. She reports that warm compresses and lemons have not helped. Could obtain US or CT to further diagnose. Reports improvement in pain and that saliva has started to come out of the gland. -Continue oral Clindamycin 450 mg q8 -Continue oral Ciprofloxacin 500 mg BID -Continue sucking on lemons -Continue warm compress   COPD Patient has COPD with  baseline 3L O2 at home. Home meds also include albuterol. -Continue albuterol inhaler   Anxiety: Able to have conversation without becoming tearful.Long standing issue with anxiety.Reports that her anxiety is improved since yesterday with the reduction in pain of her jaw. -ContinueVistaril to 50mg  TID   HTN Not taking any medications currently. Patient was previouslyonamlodipine but is no longer taking it. Last visit with PCP in March 2021 recommended possible ARB. -Continue to monitor -If needed start ARB   Malignant breast cancer Patient is currently followed by oncology. Home meds include letrozole 2.5mg  daily. -Continue home letrozole if on formulary   Osteoarthritis of spine Patient has chronic low back pain with possible neuropathic symptoms of burning/stinging. Home medications include Gabapentin reportedly being taken at home as 300mg  PRN for pain, hold at the moment unless patient complains of pain and then consider restarting.  -ContinueGabapentin 300 mg daily PRN -K pad   FEN/GI: Carb Modified/Heart Healthy with Lemon PPx: Lovenox  Disposition: Med-Surg  Prior to Admission Living Arrangement: Home Anticipated Discharge Location: SNF Barriers to Discharge: Oxygen Requirements  Anticipated discharge in approximately 1-3 day(s).   Subjective:  Interviewed patient chairside.   She reports a decrease in pain in her jaw. She reports a decrease in her anxiety. She reports that her breathing has improved. She reports not getting good sleep due to the oxygenation alarm going off repeatedly due to her sleep apnea. Discussed the importance of getting the COVID vaccine when able even though she had the infection.  Objective: Temp:  [98.1 F (36.7 C)-98.3 F (36.8 C)] 98.1 F (36.7 C) (09/23 0603) Pulse Rate:  [60-68] 60 (09/23 0603) Resp:  [20] 20 (09/23 0603) BP: (131-163)/(67-86) 131/67 (09/23 0603) SpO2:  [90 %-  96 %] 90 % (09/23 0603) Physical  Exam:  Physical Exam Vitals and nursing note reviewed.  Constitutional:      General: She is not in acute distress.    Appearance: Normal appearance. She is obese. She is not ill-appearing or toxic-appearing.  HENT:     Head: Normocephalic and atraumatic.  Cardiovascular:     Rate and Rhythm: Normal rate and regular rhythm.     Pulses: Normal pulses.     Heart sounds: Normal heart sounds. No murmur heard.   Pulmonary:     Effort: Pulmonary effort is normal. No respiratory distress.     Breath sounds: Normal breath sounds. No wheezing.  Abdominal:     Tenderness: There is no abdominal tenderness.  Musculoskeletal:     Right lower leg: Edema present.     Left lower leg: Edema present.  Neurological:     Mental Status: She is alert. Mental status is at baseline.  Psychiatric:        Mood and Affect: Mood normal.        Behavior: Behavior normal.        Thought Content: Thought content normal.      Laboratory: Recent Labs  Lab 05/07/20 0351 05/08/20 0328 05/10/20 0447  WBC 7.1 9.6 8.0  HGB 13.9 14.3 14.0  HCT 44.0 44.5 44.2  PLT 359 384 405*   Recent Labs  Lab 05/07/20 0351 05/07/20 0351 05/08/20 0328 05/09/20 0430 05/10/20 0447  NA 138   < > 139 137 138  K 4.7   < > 4.4 4.5 4.6  CL 98   < > 98 97* 98  CO2 32   < > 33* 30 31  BUN 22   < > 17 16 19   CREATININE 0.64   < > 0.74 0.74 0.76  CALCIUM 8.9   < > 8.9 9.1 8.8*  PROT 6.5  --  6.8 7.2  --   BILITOT 0.6  --  0.6 0.9  --   ALKPHOS 124  --  138* 161*  --   ALT 48*  --  50* 59*  --   AST 53*  --  43* 55*  --   GLUCOSE 202*   < > 161* 202* 196*   < > = values in this interval not displayed.     Imaging/Diagnostic Tests:   CT Angio Chest PE W and/or WO Contrast:Negative examination for pulmonary embolism. Extensive bilateral, somewhat geographic heterogeneous and ground-glass airspace opacity, consistent with multifocal infection, including COVID-19 if clinically suspected. Small pulmonary nodules in  the lung apices seen on prior examination are mostly obscured by airspace disease, visualized nodules are however unchanged. Attention on follow-up. Unchanged prominent mediastinal lymph nodes. Attention on follow-up.   Vas [place] Lower Extremity Venous: RIGHT:  - There is no evidence of deep vein thrombosis in the lower extremity.  However, portions of this examination were limited- see technologist  comments above.  - No cystic structure found in the popliteal fossa.   LEFT:  - There is no evidence of deep vein thrombosis in the lower extremity.  However, portions of this examination were limited- see technologist  comments above.  - No cystic structure found in the popliteal fossa.    DG Chest Portable 1 View:Diffusely increased interstitial markings seen throughout both lungs likely consistent with atelectasis and/or infectious etiology   Briant Cedar, MD 05/10/2020, 6:12 AM PGY-1, [place] Intern pager: [phone], text pages welcome

## 2020-05-10 NOTE — Consult Note (Signed)
   [facility] CM Inpatient Consult   05/10/2020  [name] 1951-07-31 [id number]  Follow up:  Patient was recommended for a skilled nursing facility stay and declined. Patient wanted home and declining home health as well.  Call attempts to patient's hospital phone without success.  Plan:  Will follow up with Embedded CM team, and refer patient for chronic care management program for diabetes and pneumonia.  Patient had increased oxygen need as well.  For questions,   Natividad Brood, RN BSN [facility] Liaison  [phone] business mobile phone Toll free office [phone]  Fax number: [phone] [email or web address]

## 2020-05-10 NOTE — Care Management Important Message (Signed)
Important Message  Patient Details  Name: [name] MRN: [id number] Date of Birth: 1950/08/29   Medicare Important Message Given:  Yes - Important Message mailed due to current National Emergency  Verbal consent obtained due to current National Emergency  Relationship to patient: Self Contact Name: Nichole Neyer Call Date: 05/10/20  Time: 1542 Phone: [phone] Outcome: Spoke with contact Important Message mailed to: Other (must enter comment) (patient declined additional copy of IM)    Halley Kincer P Ilamae Geng 05/10/2020, 3:42 PM

## 2020-05-11 ENCOUNTER — Telehealth: Payer: Self-pay | Admitting: Oncology

## 2020-05-11 DIAGNOSIS — M25569 Pain in unspecified knee: Secondary | ICD-10-CM | POA: Diagnosis not present

## 2020-05-11 DIAGNOSIS — I5020 Unspecified systolic (congestive) heart failure: Secondary | ICD-10-CM | POA: Diagnosis not present

## 2020-05-11 DIAGNOSIS — U071 COVID-19: Secondary | ICD-10-CM | POA: Diagnosis not present

## 2020-05-11 DIAGNOSIS — R062 Wheezing: Secondary | ICD-10-CM | POA: Diagnosis not present

## 2020-05-11 DIAGNOSIS — C3490 Malignant neoplasm of unspecified part of unspecified bronchus or lung: Secondary | ICD-10-CM | POA: Diagnosis not present

## 2020-05-11 NOTE — Telephone Encounter (Signed)
R/s appt per 9/23 sch msg - pt is aware of appt date and time  appt on 10/7

## 2020-05-14 ENCOUNTER — Telehealth: Payer: Self-pay | Admitting: *Deleted

## 2020-05-14 ENCOUNTER — Inpatient Hospital Stay: Payer: Medicare Other

## 2020-05-14 NOTE — Chronic Care Management (AMB) (Signed)
  Chronic Care Management   Note  05/14/2020 Name: [name] MRN: [id number] DOB: 14-Feb-1951  [name] is a 69 y.o. year old female who is a primary care patient of Chambliss, Jeb Levering, MD. I reached out to [name] by phone today in response to a referral sent by [name]'s health plan.     Ms. Hoganson was given information about Chronic Care Management services today including:  1. CCM service includes personalized support from designated clinical staff supervised by her physician, including individualized plan of care and coordination with other care providers 2. 24/7 contact phone numbers for assistance for urgent and routine care needs. 3. Service will only be billed when office clinical staff spend 20 minutes or more in a month to coordinate care. 4. Only one practitioner may furnish and bill the service in a calendar month. 5. The patient may stop CCM services at any time (effective at the end of the month) by phone call to the office staff. 6. The patient will be responsible for cost sharing (co-pay) of up to 20% of the service fee (after annual deductible is met).  Patient agreed to services and verbal consent obtained.   Follow up plan: Telephone appointment with care management team member scheduled for:05/21/2020  [place] Management

## 2020-05-16 ENCOUNTER — Inpatient Hospital Stay: Payer: Medicare Other

## 2020-05-17 ENCOUNTER — Telehealth: Payer: Self-pay

## 2020-05-17 NOTE — Telephone Encounter (Signed)
Patient calls nurse line regarding continued ear pain. Patient reports that she has had return of ear pain since completing antibiotics.   See below discharge note:  On 9/22 she was started on oral antibiotics- Clindamycin 450 mg q8 and Ciprofloxacin 500 mg BID. She had rapid improvement by 9/23. She was discharged with 3 additional days of both antibiotics to complete a 5 day course. Chauncey Reading   Patient is requesting additional medication to help with ear pain.   Of note, patient was recently discharged from the hospital with COVID. Please advise.   Talbot Grumbling, RN

## 2020-05-18 NOTE — Telephone Encounter (Signed)
Feeling better today Parotid is still somewhat sore but improved Wonder if there are ear drops? Discussed auralgan is not longer made  She will try warm compresses  She feels her breathign is slowly bettter  Warned if worsening needs to call 911  Make an appointment to see me mid October

## 2020-05-18 NOTE — Telephone Encounter (Signed)
Patient calls nurse line again. Patient reports continued ear pain. Patient is requesting ear drops. Please advise.

## 2020-05-21 ENCOUNTER — Telehealth: Payer: Medicare Other

## 2020-05-22 DIAGNOSIS — C50912 Malignant neoplasm of unspecified site of left female breast: Secondary | ICD-10-CM | POA: Diagnosis not present

## 2020-05-22 DIAGNOSIS — M25569 Pain in unspecified knee: Secondary | ICD-10-CM | POA: Diagnosis not present

## 2020-05-22 DIAGNOSIS — C3490 Malignant neoplasm of unspecified part of unspecified bronchus or lung: Secondary | ICD-10-CM | POA: Diagnosis not present

## 2020-05-22 DIAGNOSIS — R062 Wheezing: Secondary | ICD-10-CM | POA: Diagnosis not present

## 2020-05-22 DIAGNOSIS — I5020 Unspecified systolic (congestive) heart failure: Secondary | ICD-10-CM | POA: Diagnosis not present

## 2020-05-24 ENCOUNTER — Inpatient Hospital Stay: Payer: Medicare Other | Attending: Oncology

## 2020-05-24 ENCOUNTER — Other Ambulatory Visit: Payer: Self-pay

## 2020-05-24 ENCOUNTER — Inpatient Hospital Stay: Payer: Medicare Other

## 2020-05-24 VITALS — BP 138/58 | HR 66 | Temp 98.6°F | Resp 18

## 2020-05-24 DIAGNOSIS — C50212 Malignant neoplasm of upper-inner quadrant of left female breast: Secondary | ICD-10-CM

## 2020-05-24 DIAGNOSIS — C7800 Secondary malignant neoplasm of unspecified lung: Secondary | ICD-10-CM | POA: Diagnosis not present

## 2020-05-24 DIAGNOSIS — Z95828 Presence of other vascular implants and grafts: Secondary | ICD-10-CM

## 2020-05-24 DIAGNOSIS — Z170 Estrogen receptor positive status [ER+]: Secondary | ICD-10-CM

## 2020-05-24 DIAGNOSIS — Z5112 Encounter for antineoplastic immunotherapy: Secondary | ICD-10-CM | POA: Insufficient documentation

## 2020-05-24 LAB — CBC WITH DIFFERENTIAL (CANCER CENTER ONLY)
Abs Immature Granulocytes: 0.02 10*3/uL (ref 0.00–0.07)
Basophils Absolute: 0 10*3/uL (ref 0.0–0.1)
Basophils Relative: 0 %
Eosinophils Absolute: 0.1 10*3/uL (ref 0.0–0.5)
Eosinophils Relative: 2 %
HCT: 37.6 % (ref 36.0–46.0)
Hemoglobin: 12 g/dL (ref 12.0–15.0)
Immature Granulocytes: 0 %
Lymphocytes Relative: 25 %
Lymphs Abs: 1.4 10*3/uL (ref 0.7–4.0)
MCH: 27.6 pg (ref 26.0–34.0)
MCHC: 31.9 g/dL (ref 30.0–36.0)
MCV: 86.6 fL (ref 80.0–100.0)
Monocytes Absolute: 0.5 10*3/uL (ref 0.1–1.0)
Monocytes Relative: 10 %
Neutro Abs: 3.5 10*3/uL (ref 1.7–7.7)
Neutrophils Relative %: 63 %
Platelet Count: 147 10*3/uL — ABNORMAL LOW (ref 150–400)
RBC: 4.34 MIL/uL (ref 3.87–5.11)
RDW: 15 % (ref 11.5–15.5)
WBC Count: 5.5 10*3/uL (ref 4.0–10.5)
nRBC: 0 % (ref 0.0–0.2)

## 2020-05-24 LAB — CMP (CANCER CENTER ONLY)
ALT: 93 U/L — ABNORMAL HIGH (ref 0–44)
AST: 46 U/L — ABNORMAL HIGH (ref 15–41)
Albumin: 2.5 g/dL — ABNORMAL LOW (ref 3.5–5.0)
Alkaline Phosphatase: 173 U/L — ABNORMAL HIGH (ref 38–126)
Anion gap: 6 (ref 5–15)
BUN: 16 mg/dL (ref 8–23)
CO2: 34 mmol/L — ABNORMAL HIGH (ref 22–32)
Calcium: 9.2 mg/dL (ref 8.9–10.3)
Chloride: 99 mmol/L (ref 98–111)
Creatinine: 0.74 mg/dL (ref 0.44–1.00)
GFR, Estimated: >60 mL/min (ref >60)
Glucose, Bld: 300 mg/dL — ABNORMAL HIGH (ref 70–99)
Potassium: 4.2 mmol/L (ref 3.5–5.1)
Sodium: 139 mmol/L (ref 135–145)
Total Bilirubin: 0.3 mg/dL (ref 0.3–1.2)
Total Protein: 6.5 g/dL (ref 6.5–8.1)

## 2020-05-24 MED ORDER — TRASTUZUMAB-ANNS CHEMO 150 MG IV SOLR
750.0000 mg | Freq: Once | INTRAVENOUS | Status: AC
  Administered 2020-05-24: 750 mg via INTRAVENOUS
  Filled 2020-05-24: qty 35.72

## 2020-05-24 MED ORDER — SODIUM CHLORIDE 0.9% FLUSH
10.0000 mL | Freq: Once | INTRAVENOUS | Status: AC
  Administered 2020-05-24: 10 mL via INTRAVENOUS
  Filled 2020-05-24: qty 10

## 2020-05-24 MED ORDER — SODIUM CHLORIDE 0.9% FLUSH
10.0000 mL | Freq: Once | INTRAVENOUS | Status: AC
  Administered 2020-05-24: 10 mL
  Filled 2020-05-24: qty 10

## 2020-05-24 MED ORDER — LORAZEPAM 2 MG/ML IJ SOLN
INTRAMUSCULAR | Status: AC
  Filled 2020-05-24: qty 1

## 2020-05-24 MED ORDER — HEPARIN SOD (PORK) LOCK FLUSH 100 UNIT/ML IV SOLN
500.0000 [IU] | Freq: Once | INTRAVENOUS | Status: AC | PRN
  Administered 2020-05-24: 500 [IU]
  Filled 2020-05-24: qty 5

## 2020-05-24 MED ORDER — LORAZEPAM 2 MG/ML IJ SOLN
0.5000 mg | Freq: Once | INTRAMUSCULAR | Status: AC
  Administered 2020-05-24: 0.5 mg via INTRAVENOUS

## 2020-05-24 MED ORDER — DIPHENHYDRAMINE HCL 25 MG PO CAPS
25.0000 mg | ORAL_CAPSULE | Freq: Once | ORAL | Status: AC
  Administered 2020-05-24: 25 mg via ORAL

## 2020-05-24 MED ORDER — ACETAMINOPHEN 325 MG PO TABS
ORAL_TABLET | ORAL | Status: AC
  Filled 2020-05-24: qty 2

## 2020-05-24 MED ORDER — ACETAMINOPHEN 325 MG PO TABS
650.0000 mg | ORAL_TABLET | Freq: Once | ORAL | Status: AC
  Administered 2020-05-24: 650 mg via ORAL

## 2020-05-24 MED ORDER — SODIUM CHLORIDE 0.9 % IV SOLN
Freq: Once | INTRAVENOUS | Status: AC
  Filled 2020-05-24: qty 250

## 2020-05-24 MED ORDER — DIPHENHYDRAMINE HCL 25 MG PO CAPS
ORAL_CAPSULE | ORAL | Status: AC
  Filled 2020-05-24: qty 1

## 2020-05-24 NOTE — Patient Instructions (Signed)
[facility] Discharge Instructions for Patients Receiving Chemotherapy  Today you received the following chemotherapy agents trastuzumab.  To help prevent nausea and vomiting after your treatment, we encourage you to take your nausea medication as directed.    If you develop nausea and vomiting that is not controlled by your nausea medication, call the clinic.   BELOW ARE SYMPTOMS THAT SHOULD BE REPORTED IMMEDIATELY:  *FEVER GREATER THAN 100.5 F  *CHILLS WITH OR WITHOUT FEVER  NAUSEA AND VOMITING THAT IS NOT CONTROLLED WITH YOUR NAUSEA MEDICATION  *UNUSUAL SHORTNESS OF BREATH  *UNUSUAL BRUISING OR BLEEDING  TENDERNESS IN MOUTH AND THROAT WITH OR WITHOUT PRESENCE OF ULCERS  *URINARY PROBLEMS  *BOWEL PROBLEMS  UNUSUAL RASH Items with * indicate a potential emergency and should be followed up as soon as possible.  Feel free to call the clinic should you have any questions or concerns. The clinic phone number is [phone].  Please show the CHEMO ALERT CARD at check-in to the Emergency Department and triage nurse.   

## 2020-05-25 ENCOUNTER — Other Ambulatory Visit: Payer: Self-pay | Admitting: Family Medicine

## 2020-05-31 ENCOUNTER — Telehealth: Payer: Self-pay

## 2020-05-31 NOTE — Telephone Encounter (Signed)
Patient calls nurse line for (2) reasons. Patient reports continued ear pain after Covid, patient is requesting "something" for this. Patient denies any fever or SOB. Patient reports a new bed sore on her left buttocks. Patient is unsure of how long it has been there, she just noticed blood on her underwear yesterday. Patient reports daughter took a picture and it does not appear to be infected at this time. Patient denies pain, or warmth to touch. Patient does report it has scabbed over now. Patient requesting a barrier cream with some type of "anti infection" to prevent from getting infected. ED precautions and signs of infection given to patient. Will forward to PCP.

## 2020-05-31 NOTE — Telephone Encounter (Signed)
Please ask her to come in for an office visit to look at her ear and skin  She is > 20 days since hospitalized with Covid  Thanks  Truman Hayward

## 2020-05-31 NOTE — Telephone Encounter (Signed)
Patient scheduled office visit for 10/27 at 1110. Patient does not have transportation for earlier appointments.   Talbot Grumbling, RN

## 2020-06-01 ENCOUNTER — Telehealth: Payer: Medicare Other

## 2020-06-01 ENCOUNTER — Telehealth: Payer: Self-pay

## 2020-06-01 NOTE — Telephone Encounter (Signed)
  Care Management   Outreach Note  06/01/2020 Name: [name] MRN: [id number] DOB: 1951-01-13  Referred by: [name], MD Reason for referral : Appointment (Initial)   An unsuccessful telephone outreach was attempted today. The patient was referred to the case management team for assistance with care management and care coordination.  Attempted to call twice unable to leave a message voicemail full.  Follow Up Plan: The care management team will reach out to the patient again over the next 7-10 days.   Lazaro Arms RN, BSN, [facility] Care Management Coordinator [place] Phone: [phone] Fax: [phone]

## 2020-06-02 ENCOUNTER — Other Ambulatory Visit: Payer: Self-pay | Admitting: Family Medicine

## 2020-06-07 ENCOUNTER — Telehealth: Payer: Self-pay | Admitting: *Deleted

## 2020-06-07 NOTE — Chronic Care Management (AMB) (Signed)
  Care Management   Note  06/07/2020 Name: [name] MRN: [id number] DOB: 1951-05-22  [name] is a 69 y.o. year old female who is a primary care patient of Chambliss, Jeb Levering, MD and is actively engaged with the care management team. I reached out to [name] by phone today to assist with re-scheduling an initial visit with the RN Case Manager.  Follow up plan: Patient declines further follow up and engagement by the care management team. Appropriate care team members and provider have been notified via electronic communication. If patient returns call to provider office, please advise to call [place] at [phone].  [place] Management

## 2020-06-09 DIAGNOSIS — I5020 Unspecified systolic (congestive) heart failure: Secondary | ICD-10-CM | POA: Diagnosis not present

## 2020-06-09 DIAGNOSIS — M25569 Pain in unspecified knee: Secondary | ICD-10-CM | POA: Diagnosis not present

## 2020-06-09 DIAGNOSIS — C3490 Malignant neoplasm of unspecified part of unspecified bronchus or lung: Secondary | ICD-10-CM | POA: Diagnosis not present

## 2020-06-09 DIAGNOSIS — C50912 Malignant neoplasm of unspecified site of left female breast: Secondary | ICD-10-CM | POA: Diagnosis not present

## 2020-06-09 DIAGNOSIS — R062 Wheezing: Secondary | ICD-10-CM | POA: Diagnosis not present

## 2020-06-10 DIAGNOSIS — U071 COVID-19: Secondary | ICD-10-CM | POA: Diagnosis not present

## 2020-06-10 DIAGNOSIS — R062 Wheezing: Secondary | ICD-10-CM | POA: Diagnosis not present

## 2020-06-10 DIAGNOSIS — C3490 Malignant neoplasm of unspecified part of unspecified bronchus or lung: Secondary | ICD-10-CM | POA: Diagnosis not present

## 2020-06-10 DIAGNOSIS — M25569 Pain in unspecified knee: Secondary | ICD-10-CM | POA: Diagnosis not present

## 2020-06-10 DIAGNOSIS — I5020 Unspecified systolic (congestive) heart failure: Secondary | ICD-10-CM | POA: Diagnosis not present

## 2020-06-13 ENCOUNTER — Ambulatory Visit: Payer: Medicare Other | Admitting: Family Medicine

## 2020-06-13 ENCOUNTER — Other Ambulatory Visit: Payer: Medicare Other

## 2020-06-13 ENCOUNTER — Ambulatory Visit: Payer: Medicare Other

## 2020-06-20 ENCOUNTER — Telehealth: Payer: Self-pay

## 2020-06-20 NOTE — Telephone Encounter (Signed)
Patient calls nurse line regarding receiving medication for pressure ulcer of buttocks. Patient reports that she spoke with provider regarding medication, however, she has not received anything. Patient reports that she was unable to keep last appointment due to transportation issues. Patient states that she will return call to office to schedule appointment soon.   Patient is also needing rx for pen needles to be sent into pharmacy. These are not on medication list.   Please advise.   Talbot Grumbling, RN

## 2020-06-21 ENCOUNTER — Inpatient Hospital Stay: Payer: Medicare Other

## 2020-06-21 ENCOUNTER — Other Ambulatory Visit: Payer: Self-pay

## 2020-06-21 ENCOUNTER — Inpatient Hospital Stay: Payer: Medicare Other | Attending: Oncology

## 2020-06-21 VITALS — BP 129/82 | HR 61 | Temp 98.2°F | Resp 20 | Wt 322.5 lb

## 2020-06-21 DIAGNOSIS — Z95828 Presence of other vascular implants and grafts: Secondary | ICD-10-CM

## 2020-06-21 DIAGNOSIS — C50212 Malignant neoplasm of upper-inner quadrant of left female breast: Secondary | ICD-10-CM

## 2020-06-21 DIAGNOSIS — Z79811 Long term (current) use of aromatase inhibitors: Secondary | ICD-10-CM | POA: Diagnosis not present

## 2020-06-21 DIAGNOSIS — Z5112 Encounter for antineoplastic immunotherapy: Secondary | ICD-10-CM | POA: Insufficient documentation

## 2020-06-21 DIAGNOSIS — Z170 Estrogen receptor positive status [ER+]: Secondary | ICD-10-CM

## 2020-06-21 DIAGNOSIS — C7800 Secondary malignant neoplasm of unspecified lung: Secondary | ICD-10-CM | POA: Insufficient documentation

## 2020-06-21 LAB — CMP (CANCER CENTER ONLY)
ALT: 68 U/L — ABNORMAL HIGH (ref 0–44)
AST: 52 U/L — ABNORMAL HIGH (ref 15–41)
Albumin: 3.1 g/dL — ABNORMAL LOW (ref 3.5–5.0)
Alkaline Phosphatase: 89 U/L (ref 38–126)
Anion gap: 6 (ref 5–15)
BUN: 14 mg/dL (ref 8–23)
CO2: 34 mmol/L — ABNORMAL HIGH (ref 22–32)
Calcium: 9.4 mg/dL (ref 8.9–10.3)
Chloride: 100 mmol/L (ref 98–111)
Creatinine: 0.69 mg/dL (ref 0.44–1.00)
GFR, Estimated: >60 mL/min (ref >60)
Glucose, Bld: 203 mg/dL — ABNORMAL HIGH (ref 70–99)
Potassium: 4.2 mmol/L (ref 3.5–5.1)
Sodium: 140 mmol/L (ref 135–145)
Total Bilirubin: 0.3 mg/dL (ref 0.3–1.2)
Total Protein: 6.9 g/dL (ref 6.5–8.1)

## 2020-06-21 LAB — CBC WITH DIFFERENTIAL (CANCER CENTER ONLY)
Abs Immature Granulocytes: 0.02 10*3/uL (ref 0.00–0.07)
Basophils Absolute: 0 10*3/uL (ref 0.0–0.1)
Basophils Relative: 1 %
Eosinophils Absolute: 0.1 10*3/uL (ref 0.0–0.5)
Eosinophils Relative: 2 %
HCT: 37.3 % (ref 36.0–46.0)
Hemoglobin: 11.6 g/dL — ABNORMAL LOW (ref 12.0–15.0)
Immature Granulocytes: 0 %
Lymphocytes Relative: 24 %
Lymphs Abs: 1.3 10*3/uL (ref 0.7–4.0)
MCH: 27.3 pg (ref 26.0–34.0)
MCHC: 31.1 g/dL (ref 30.0–36.0)
MCV: 87.8 fL (ref 80.0–100.0)
Monocytes Absolute: 0.4 10*3/uL (ref 0.1–1.0)
Monocytes Relative: 8 %
Neutro Abs: 3.7 10*3/uL (ref 1.7–7.7)
Neutrophils Relative %: 65 %
Platelet Count: 176 10*3/uL (ref 150–400)
RBC: 4.25 MIL/uL (ref 3.87–5.11)
RDW: 14.9 % (ref 11.5–15.5)
WBC Count: 5.6 10*3/uL (ref 4.0–10.5)
nRBC: 0 % (ref 0.0–0.2)

## 2020-06-21 MED ORDER — HEPARIN SOD (PORK) LOCK FLUSH 100 UNIT/ML IV SOLN
500.0000 [IU] | Freq: Once | INTRAVENOUS | Status: AC | PRN
  Administered 2020-06-21: 500 [IU]
  Filled 2020-06-21: qty 5

## 2020-06-21 MED ORDER — DIPHENHYDRAMINE HCL 25 MG PO CAPS
ORAL_CAPSULE | ORAL | Status: AC
  Filled 2020-06-21: qty 1

## 2020-06-21 MED ORDER — SODIUM CHLORIDE 0.9% FLUSH
10.0000 mL | Freq: Once | INTRAVENOUS | Status: AC
  Administered 2020-06-21: 10 mL
  Filled 2020-06-21: qty 10

## 2020-06-21 MED ORDER — SODIUM CHLORIDE 0.9 % IV SOLN
Freq: Once | INTRAVENOUS | Status: AC
  Filled 2020-06-21: qty 250

## 2020-06-21 MED ORDER — DIPHENHYDRAMINE HCL 25 MG PO CAPS
25.0000 mg | ORAL_CAPSULE | Freq: Once | ORAL | Status: AC
  Administered 2020-06-21: 25 mg via ORAL

## 2020-06-21 MED ORDER — LORAZEPAM 2 MG/ML IJ SOLN
INTRAMUSCULAR | Status: AC
  Filled 2020-06-21: qty 1

## 2020-06-21 MED ORDER — DIPHENHYDRAMINE HCL 50 MG/ML IJ SOLN
INTRAMUSCULAR | Status: AC
  Filled 2020-06-21: qty 1

## 2020-06-21 MED ORDER — TRASTUZUMAB-ANNS CHEMO 150 MG IV SOLR
750.0000 mg | Freq: Once | INTRAVENOUS | Status: AC
  Administered 2020-06-21: 750 mg via INTRAVENOUS
  Filled 2020-06-21: qty 35.72

## 2020-06-21 MED ORDER — ACETAMINOPHEN 325 MG PO TABS
650.0000 mg | ORAL_TABLET | Freq: Once | ORAL | Status: AC
  Administered 2020-06-21: 650 mg via ORAL

## 2020-06-21 MED ORDER — LORAZEPAM 2 MG/ML IJ SOLN
0.5000 mg | Freq: Once | INTRAMUSCULAR | Status: AC
  Administered 2020-06-21: 0.5 mg via INTRAVENOUS

## 2020-06-21 MED ORDER — SODIUM CHLORIDE 0.9% FLUSH
10.0000 mL | Freq: Once | INTRAVENOUS | Status: AC
  Administered 2020-06-21: 10 mL via INTRAVENOUS
  Filled 2020-06-21: qty 10

## 2020-06-21 MED ORDER — ACETAMINOPHEN 325 MG PO TABS
ORAL_TABLET | ORAL | Status: AC
  Filled 2020-06-21: qty 2

## 2020-06-21 NOTE — Patient Instructions (Signed)

## 2020-06-21 NOTE — Telephone Encounter (Signed)
Pls contact her and ask  What is the name of the needles and size that she uses now  What is the name of the medication she is using on her pressure ulcer so I can refill.  She would need to be seen if she needs something else so I would know what to prescribe  Thanks  [place]

## 2020-06-21 NOTE — Patient Instructions (Signed)
[place] Discharge Instructions for Patients Receiving Chemotherapy  Today you received the following chemotherapy agents: Kanjinti  To help prevent nausea and vomiting after your treatment, we encourage you to take your nausea medication as directed.    If you develop nausea and vomiting that is not controlled by your nausea medication, call the clinic.   BELOW ARE SYMPTOMS THAT SHOULD BE REPORTED IMMEDIATELY:  *FEVER GREATER THAN 100.5 F  *CHILLS WITH OR WITHOUT FEVER  NAUSEA AND VOMITING THAT IS NOT CONTROLLED WITH YOUR NAUSEA MEDICATION  *UNUSUAL SHORTNESS OF BREATH  *UNUSUAL BRUISING OR BLEEDING  TENDERNESS IN MOUTH AND THROAT WITH OR WITHOUT PRESENCE OF ULCERS  *URINARY PROBLEMS  *BOWEL PROBLEMS  UNUSUAL RASH Items with * indicate a potential emergency and should be followed up as soon as possible.  Feel free to call the clinic should you have any questions or concerns. The clinic phone number is [phone].  Please show the [place] at check-in to the Emergency Department and triage nurse.

## 2020-06-21 NOTE — Progress Notes (Signed)
Unable to document lorazepam waste in pixys. Lorazepam 1.5mg (0.42ml)  wasted and witnessed by Dow Adolph, RN.

## 2020-06-22 DIAGNOSIS — C50912 Malignant neoplasm of unspecified site of left female breast: Secondary | ICD-10-CM | POA: Diagnosis not present

## 2020-06-22 DIAGNOSIS — R062 Wheezing: Secondary | ICD-10-CM | POA: Diagnosis not present

## 2020-06-22 DIAGNOSIS — C3490 Malignant neoplasm of unspecified part of unspecified bronchus or lung: Secondary | ICD-10-CM | POA: Diagnosis not present

## 2020-06-22 DIAGNOSIS — I5020 Unspecified systolic (congestive) heart failure: Secondary | ICD-10-CM | POA: Diagnosis not present

## 2020-06-22 DIAGNOSIS — M25569 Pain in unspecified knee: Secondary | ICD-10-CM | POA: Diagnosis not present

## 2020-06-22 NOTE — Telephone Encounter (Signed)
Attempted to call patient with no answer and voice mail box is full.  Will try later.  [name], [place]

## 2020-06-25 NOTE — Telephone Encounter (Signed)
Called patient concerning needles and she states that "she has no idea".  States that she will get some form her brother if she can not get them refilled.  Patient states that the ulcer cream did nothing for her and she actually thinks it was a boil because it burst.  Patient added that she is having right ear pain and wants something to relieve it.  The ear pops when she yawns and is really painful.  Patient stated that she has a scope and a phone app that allows her to look in her ear.  States that it is not red but still hurts.  Patient has transportation issues and can not get her until her appointment on 07/11/2020.  Ozella Almond, [place]

## 2020-06-27 ENCOUNTER — Other Ambulatory Visit: Payer: Self-pay | Admitting: Family Medicine

## 2020-07-10 DIAGNOSIS — U071 COVID-19: Secondary | ICD-10-CM | POA: Diagnosis not present

## 2020-07-10 DIAGNOSIS — C50912 Malignant neoplasm of unspecified site of left female breast: Secondary | ICD-10-CM | POA: Diagnosis not present

## 2020-07-10 DIAGNOSIS — R062 Wheezing: Secondary | ICD-10-CM | POA: Diagnosis not present

## 2020-07-10 DIAGNOSIS — M25569 Pain in unspecified knee: Secondary | ICD-10-CM | POA: Diagnosis not present

## 2020-07-10 DIAGNOSIS — C3490 Malignant neoplasm of unspecified part of unspecified bronchus or lung: Secondary | ICD-10-CM | POA: Diagnosis not present

## 2020-07-10 DIAGNOSIS — I5020 Unspecified systolic (congestive) heart failure: Secondary | ICD-10-CM | POA: Diagnosis not present

## 2020-07-11 ENCOUNTER — Encounter: Payer: Self-pay | Admitting: Family Medicine

## 2020-07-11 ENCOUNTER — Other Ambulatory Visit: Payer: Medicare Other

## 2020-07-11 ENCOUNTER — Ambulatory Visit (INDEPENDENT_AMBULATORY_CARE_PROVIDER_SITE_OTHER): Payer: Medicare Other | Admitting: Family Medicine

## 2020-07-11 ENCOUNTER — Ambulatory Visit: Payer: Medicare Other

## 2020-07-11 ENCOUNTER — Other Ambulatory Visit: Payer: Self-pay

## 2020-07-11 DIAGNOSIS — I10 Essential (primary) hypertension: Secondary | ICD-10-CM | POA: Diagnosis not present

## 2020-07-11 DIAGNOSIS — R0902 Hypoxemia: Secondary | ICD-10-CM | POA: Diagnosis not present

## 2020-07-11 DIAGNOSIS — E1149 Type 2 diabetes mellitus with other diabetic neurological complication: Secondary | ICD-10-CM

## 2020-07-11 DIAGNOSIS — K1120 Sialoadenitis, unspecified: Secondary | ICD-10-CM | POA: Diagnosis not present

## 2020-07-11 MED ORDER — SEMAGLUTIDE (1 MG/DOSE) 2 MG/1.5ML ~~LOC~~ SOPN: PEN_INJECTOR | SUBCUTANEOUS | 3 refills | Status: DC

## 2020-07-11 MED ORDER — LANTUS SOLOSTAR 100 UNIT/ML ~~LOC~~ SOPN: 32.0000 [IU] | PEN_INJECTOR | Freq: Every morning | SUBCUTANEOUS | 3 refills | Status: DC

## 2020-07-11 NOTE — Progress Notes (Signed)
Asked by Dr. Erin Hearing to counsel patient on new medication, Ozempic 0.25mg  weekly. Medication Samples have been provided to the patient.  Drug name: Ozempic       Strength: 2mg /1.73mL pen        Qty: 1  LOT: [id number]  Exp.Date: 06/17/2022  Dosing instructions: Inject 0.25 mg once weekly  The patient has been instructed regarding the correct time, dose, and frequency of taking this medication, including desired effects and most common side effects. Patient verbalized understanding.  Discussed starting medication 1-3 days after holiday in case of GI side effects.  Dimple Nanas, PharmD PGY-1 Acute Care Pharmacy Resident 07/11/2020 12:30 PM

## 2020-07-11 NOTE — Assessment & Plan Note (Signed)
BP Readings from Last 3 Encounters:  07/11/20 130/70  06/21/20 129/82  05/24/20 (!) 138/58   At goal today.  Off all hypertension medications.  Will monitor

## 2020-07-11 NOTE — Patient Instructions (Addendum)
Your ears look good - no infection or ulcers in the canals  You should follow up with your ENT doctor for the swollen saliva glands and for the hearing and full feeling in your ears.  I  For the diabetes  - Increase your lantus to 32 units each morning  - Start semaglutide as directed  - Keep taking humalog as you are  Let me know if you have any low blood sugars  Come back in one month Please bring all your medications    As always  You need a colonoscopy to prevent colon cancer.  I have placed a referral to the gastroenterologist's office.  They should call you within two weeks.  If they do not please let me know  You need a mammogram to prevent breast cancer.  Please schedule an appointment.  You can call [phone].

## 2020-07-11 NOTE — Assessment & Plan Note (Signed)
Very poorly controlled.  Taking medications irregularly.  Discussed trying to take more on schedule.  Wil start semaglutide weekly which hopefully will help with compliance and weight loss

## 2020-07-11 NOTE — Assessment & Plan Note (Signed)
Continues post covid. She feels is improving.  Currently at 5 liters.

## 2020-07-11 NOTE — Progress Notes (Signed)
    SUBJECTIVE:   CHIEF COMPLAINT / HPI:   Diabetes  Brings in her meter.  AM readings in upper 200s to low 300s without any lows.  Taking lantus 28 u most mornings and sometimes 12-16 units of humalog with some meals.  Tried empaglaflozin unsure why she stopped   Ears Feel full.  No discharge.  Has chronic hearing loss  Swollen Glands Intermittently has swollen neck glands that go down when takes lemon juice.  None now.  No pain or fever   Covid On 5liters of Oxygen continuously    PERTINENT  PMH / PSH: Has seen [facility] thinks Dr Redmond Baseman in the past  OBJECTIVE:   BP 130/70   Pulse 71   Ht 5\' 4"  (1.626 m)   SpO2 98%   BMI 55.36 kg/m   Alert conversant Feet - dry with irregular nails.  No lesions Heart - Regular rate and rhythm.  No murmurs, gallops or rubs.    Lungs:  Normal respiratory effort, chest expands symmetrically. Lungs are clear to auscultation, no crackles or wheezes. Ears:  External ear exam shows no significant lesions or deformities.  Otoscopic examination reveals clear canals, tympanic membranes are intact bilaterally without bulging, retraction, inflammation or discharge. Hearing is grossly normal bilaterall Neck:  No deformities, thyromegaly, masses, or tenderness noted.   Supple with full range of motion without pain.   ASSESSMENT/PLAN:   HYPERTENSION, BENIGN SYSTEMIC BP Readings from Last 3 Encounters:  07/11/20 130/70  06/21/20 129/82  05/24/20 (!) 138/58   At goal today.  Off all hypertension medications.  Will monitor  Sialadenitis Recurrent.  Suggest follow up with her ENT and continue lemon juice.  No signs of infection today   Diabetes mellitus type 2 with neurological manifestations ([place]) Very poorly controlled.  Taking medications irregularly.  Discussed trying to take more on schedule.  Wil start semaglutide weekly which hopefully will help with compliance and weight loss   Hypoxia Continues post covid. She feels is improving.  Currently  at 5 liters.       [name], MD [place]

## 2020-07-11 NOTE — Assessment & Plan Note (Signed)
Recurrent.  Suggest follow up with her ENT and continue lemon juice.  No signs of infection today

## 2020-07-13 ENCOUNTER — Other Ambulatory Visit: Payer: Self-pay | Admitting: Family Medicine

## 2020-07-18 ENCOUNTER — Other Ambulatory Visit: Payer: Self-pay

## 2020-07-18 DIAGNOSIS — C50212 Malignant neoplasm of upper-inner quadrant of left female breast: Secondary | ICD-10-CM

## 2020-07-18 DIAGNOSIS — C3490 Malignant neoplasm of unspecified part of unspecified bronchus or lung: Secondary | ICD-10-CM | POA: Diagnosis not present

## 2020-07-18 DIAGNOSIS — U071 COVID-19: Secondary | ICD-10-CM | POA: Diagnosis not present

## 2020-07-18 DIAGNOSIS — I5020 Unspecified systolic (congestive) heart failure: Secondary | ICD-10-CM | POA: Diagnosis not present

## 2020-07-18 DIAGNOSIS — Z170 Estrogen receptor positive status [ER+]: Secondary | ICD-10-CM

## 2020-07-18 DIAGNOSIS — R062 Wheezing: Secondary | ICD-10-CM | POA: Diagnosis not present

## 2020-07-18 DIAGNOSIS — M25569 Pain in unspecified knee: Secondary | ICD-10-CM | POA: Diagnosis not present

## 2020-07-19 ENCOUNTER — Inpatient Hospital Stay: Payer: Medicare Other | Attending: Oncology

## 2020-07-19 ENCOUNTER — Other Ambulatory Visit: Payer: Self-pay

## 2020-07-19 ENCOUNTER — Inpatient Hospital Stay: Payer: Medicare Other

## 2020-07-19 VITALS — BP 158/63 | HR 66 | Temp 98.6°F | Resp 20

## 2020-07-19 DIAGNOSIS — Z170 Estrogen receptor positive status [ER+]: Secondary | ICD-10-CM

## 2020-07-19 DIAGNOSIS — Z833 Family history of diabetes mellitus: Secondary | ICD-10-CM | POA: Diagnosis not present

## 2020-07-19 DIAGNOSIS — Z79811 Long term (current) use of aromatase inhibitors: Secondary | ICD-10-CM | POA: Insufficient documentation

## 2020-07-19 DIAGNOSIS — Z8041 Family history of malignant neoplasm of ovary: Secondary | ICD-10-CM | POA: Insufficient documentation

## 2020-07-19 DIAGNOSIS — E119 Type 2 diabetes mellitus without complications: Secondary | ICD-10-CM | POA: Insufficient documentation

## 2020-07-19 DIAGNOSIS — Z794 Long term (current) use of insulin: Secondary | ICD-10-CM | POA: Insufficient documentation

## 2020-07-19 DIAGNOSIS — C50212 Malignant neoplasm of upper-inner quadrant of left female breast: Secondary | ICD-10-CM

## 2020-07-19 DIAGNOSIS — Z87891 Personal history of nicotine dependence: Secondary | ICD-10-CM | POA: Diagnosis not present

## 2020-07-19 DIAGNOSIS — Z803 Family history of malignant neoplasm of breast: Secondary | ICD-10-CM | POA: Diagnosis not present

## 2020-07-19 DIAGNOSIS — Z95828 Presence of other vascular implants and grafts: Secondary | ICD-10-CM

## 2020-07-19 DIAGNOSIS — Z86718 Personal history of other venous thrombosis and embolism: Secondary | ICD-10-CM | POA: Diagnosis not present

## 2020-07-19 DIAGNOSIS — Z5112 Encounter for antineoplastic immunotherapy: Secondary | ICD-10-CM | POA: Diagnosis not present

## 2020-07-19 DIAGNOSIS — Z79899 Other long term (current) drug therapy: Secondary | ICD-10-CM | POA: Insufficient documentation

## 2020-07-19 DIAGNOSIS — Z8249 Family history of ischemic heart disease and other diseases of the circulatory system: Secondary | ICD-10-CM | POA: Diagnosis not present

## 2020-07-19 DIAGNOSIS — C7800 Secondary malignant neoplasm of unspecified lung: Secondary | ICD-10-CM | POA: Insufficient documentation

## 2020-07-19 LAB — CBC WITH DIFFERENTIAL (CANCER CENTER ONLY)
Abs Immature Granulocytes: 0.04 10*3/uL (ref 0.00–0.07)
Basophils Absolute: 0 10*3/uL (ref 0.0–0.1)
Basophils Relative: 0 %
Eosinophils Absolute: 0.1 10*3/uL (ref 0.0–0.5)
Eosinophils Relative: 2 %
HCT: 40.2 % (ref 36.0–46.0)
Hemoglobin: 12.9 g/dL (ref 12.0–15.0)
Immature Granulocytes: 1 %
Lymphocytes Relative: 26 %
Lymphs Abs: 1.7 10*3/uL (ref 0.7–4.0)
MCH: 28.1 pg (ref 26.0–34.0)
MCHC: 32.1 g/dL (ref 30.0–36.0)
MCV: 87.6 fL (ref 80.0–100.0)
Monocytes Absolute: 0.5 10*3/uL (ref 0.1–1.0)
Monocytes Relative: 8 %
Neutro Abs: 4.1 10*3/uL (ref 1.7–7.7)
Neutrophils Relative %: 63 %
Platelet Count: 214 10*3/uL (ref 150–400)
RBC: 4.59 MIL/uL (ref 3.87–5.11)
RDW: 15.5 % (ref 11.5–15.5)
WBC Count: 6.5 10*3/uL (ref 4.0–10.5)
nRBC: 0 % (ref 0.0–0.2)

## 2020-07-19 LAB — CMP (CANCER CENTER ONLY)
ALT: 68 U/L — ABNORMAL HIGH (ref 0–44)
AST: 35 U/L (ref 15–41)
Albumin: 3.1 g/dL — ABNORMAL LOW (ref 3.5–5.0)
Alkaline Phosphatase: 97 U/L (ref 38–126)
Anion gap: 9 (ref 5–15)
BUN: 15 mg/dL (ref 8–23)
CO2: 27 mmol/L (ref 22–32)
Calcium: 9.5 mg/dL (ref 8.9–10.3)
Chloride: 103 mmol/L (ref 98–111)
Creatinine: 0.76 mg/dL (ref 0.44–1.00)
GFR, Estimated: >60 mL/min (ref >60)
Glucose, Bld: 297 mg/dL — ABNORMAL HIGH (ref 70–99)
Potassium: 4.1 mmol/L (ref 3.5–5.1)
Sodium: 139 mmol/L (ref 135–145)
Total Bilirubin: 0.3 mg/dL (ref 0.3–1.2)
Total Protein: 7.1 g/dL (ref 6.5–8.1)

## 2020-07-19 MED ORDER — SODIUM CHLORIDE 0.9% FLUSH
10.0000 mL | Freq: Once | INTRAVENOUS | Status: AC
  Administered 2020-07-19: 10 mL via INTRAVENOUS
  Filled 2020-07-19: qty 10

## 2020-07-19 MED ORDER — LORAZEPAM 2 MG/ML IJ SOLN
INTRAMUSCULAR | Status: AC
  Filled 2020-07-19: qty 1

## 2020-07-19 MED ORDER — LORAZEPAM 2 MG/ML IJ SOLN
0.5000 mg | Freq: Once | INTRAMUSCULAR | Status: AC
  Administered 2020-07-19: 0.5 mg via INTRAVENOUS

## 2020-07-19 MED ORDER — TRASTUZUMAB-ANNS CHEMO 150 MG IV SOLR
750.0000 mg | Freq: Once | INTRAVENOUS | Status: AC
  Administered 2020-07-19: 750 mg via INTRAVENOUS
  Filled 2020-07-19: qty 35.72

## 2020-07-19 MED ORDER — SODIUM CHLORIDE 0.9% FLUSH
10.0000 mL | Freq: Once | INTRAVENOUS | Status: AC
  Administered 2020-07-19: 10 mL
  Filled 2020-07-19: qty 10

## 2020-07-19 MED ORDER — HEPARIN SOD (PORK) LOCK FLUSH 100 UNIT/ML IV SOLN
500.0000 [IU] | Freq: Once | INTRAVENOUS | Status: AC | PRN
  Administered 2020-07-19: 500 [IU]
  Filled 2020-07-19: qty 5

## 2020-07-19 MED ORDER — DIPHENHYDRAMINE HCL 25 MG PO CAPS
25.0000 mg | ORAL_CAPSULE | Freq: Once | ORAL | Status: AC
  Administered 2020-07-19: 25 mg via ORAL

## 2020-07-19 MED ORDER — DIPHENHYDRAMINE HCL 25 MG PO CAPS
ORAL_CAPSULE | ORAL | Status: AC
  Filled 2020-07-19: qty 1

## 2020-07-19 MED ORDER — ACETAMINOPHEN 325 MG PO TABS
650.0000 mg | ORAL_TABLET | Freq: Once | ORAL | Status: AC
  Administered 2020-07-19: 650 mg via ORAL

## 2020-07-19 MED ORDER — SODIUM CHLORIDE 0.9 % IV SOLN
Freq: Once | INTRAVENOUS | Status: AC
  Filled 2020-07-19: qty 250

## 2020-07-19 MED ORDER — ACETAMINOPHEN 325 MG PO TABS
ORAL_TABLET | ORAL | Status: AC
  Filled 2020-07-19: qty 2

## 2020-07-19 NOTE — Patient Instructions (Signed)
[place] Discharge Instructions for Patients Receiving Chemotherapy  Today you received the following chemotherapy agents: Kanjinti  To help prevent nausea and vomiting after your treatment, we encourage you to take your nausea medication as directed.    If you develop nausea and vomiting that is not controlled by your nausea medication, call the clinic.   BELOW ARE SYMPTOMS THAT SHOULD BE REPORTED IMMEDIATELY:  *FEVER GREATER THAN 100.5 F  *CHILLS WITH OR WITHOUT FEVER  NAUSEA AND VOMITING THAT IS NOT CONTROLLED WITH YOUR NAUSEA MEDICATION  *UNUSUAL SHORTNESS OF BREATH  *UNUSUAL BRUISING OR BLEEDING  TENDERNESS IN MOUTH AND THROAT WITH OR WITHOUT PRESENCE OF ULCERS  *URINARY PROBLEMS  *BOWEL PROBLEMS  UNUSUAL RASH Items with * indicate a potential emergency and should be followed up as soon as possible.  Feel free to call the clinic should you have any questions or concerns. The clinic phone number is [phone].  Please show the [place] at check-in to the Emergency Department and triage nurse.

## 2020-07-22 DIAGNOSIS — C50912 Malignant neoplasm of unspecified site of left female breast: Secondary | ICD-10-CM | POA: Diagnosis not present

## 2020-07-22 DIAGNOSIS — M25569 Pain in unspecified knee: Secondary | ICD-10-CM | POA: Diagnosis not present

## 2020-07-22 DIAGNOSIS — C3490 Malignant neoplasm of unspecified part of unspecified bronchus or lung: Secondary | ICD-10-CM | POA: Diagnosis not present

## 2020-07-22 DIAGNOSIS — R062 Wheezing: Secondary | ICD-10-CM | POA: Diagnosis not present

## 2020-07-22 DIAGNOSIS — I5020 Unspecified systolic (congestive) heart failure: Secondary | ICD-10-CM | POA: Diagnosis not present

## 2020-07-23 ENCOUNTER — Telehealth: Payer: Self-pay | Admitting: Adult Health

## 2020-07-23 NOTE — Telephone Encounter (Signed)
Called pt per 12/6 sch msg - no answer. And unable to leave message for patient to reschedule.

## 2020-07-25 ENCOUNTER — Inpatient Hospital Stay: Payer: Medicare Other | Admitting: Adult Health

## 2020-07-26 ENCOUNTER — Other Ambulatory Visit: Payer: Self-pay | Admitting: Family Medicine

## 2020-08-01 ENCOUNTER — Encounter: Payer: Self-pay | Admitting: *Deleted

## 2020-08-09 DIAGNOSIS — R062 Wheezing: Secondary | ICD-10-CM | POA: Diagnosis not present

## 2020-08-09 DIAGNOSIS — C50912 Malignant neoplasm of unspecified site of left female breast: Secondary | ICD-10-CM | POA: Diagnosis not present

## 2020-08-09 DIAGNOSIS — M25569 Pain in unspecified knee: Secondary | ICD-10-CM | POA: Diagnosis not present

## 2020-08-09 DIAGNOSIS — U071 COVID-19: Secondary | ICD-10-CM | POA: Diagnosis not present

## 2020-08-09 DIAGNOSIS — C3490 Malignant neoplasm of unspecified part of unspecified bronchus or lung: Secondary | ICD-10-CM | POA: Diagnosis not present

## 2020-08-09 DIAGNOSIS — I5020 Unspecified systolic (congestive) heart failure: Secondary | ICD-10-CM | POA: Diagnosis not present

## 2020-08-20 ENCOUNTER — Other Ambulatory Visit: Payer: Self-pay | Admitting: *Deleted

## 2020-08-21 ENCOUNTER — Other Ambulatory Visit: Payer: Self-pay | Admitting: Oncology

## 2020-08-21 ENCOUNTER — Other Ambulatory Visit: Payer: Self-pay

## 2020-08-21 DIAGNOSIS — Z170 Estrogen receptor positive status [ER+]: Secondary | ICD-10-CM

## 2020-08-21 DIAGNOSIS — C50212 Malignant neoplasm of upper-inner quadrant of left female breast: Secondary | ICD-10-CM

## 2020-08-22 ENCOUNTER — Other Ambulatory Visit: Payer: Self-pay | Admitting: Oncology

## 2020-08-22 ENCOUNTER — Inpatient Hospital Stay: Payer: Medicare Other

## 2020-08-22 ENCOUNTER — Other Ambulatory Visit: Payer: Self-pay

## 2020-08-22 ENCOUNTER — Inpatient Hospital Stay: Payer: Medicare Other | Attending: Oncology

## 2020-08-22 ENCOUNTER — Inpatient Hospital Stay (HOSPITAL_BASED_OUTPATIENT_CLINIC_OR_DEPARTMENT_OTHER): Payer: Medicare Other | Admitting: Medical

## 2020-08-22 VITALS — BP 175/62 | HR 78 | Temp 98.6°F | Resp 20 | Ht 64.0 in | Wt 317.1 lb

## 2020-08-22 DIAGNOSIS — G4730 Sleep apnea, unspecified: Secondary | ICD-10-CM | POA: Insufficient documentation

## 2020-08-22 DIAGNOSIS — J449 Chronic obstructive pulmonary disease, unspecified: Secondary | ICD-10-CM | POA: Diagnosis not present

## 2020-08-22 DIAGNOSIS — E119 Type 2 diabetes mellitus without complications: Secondary | ICD-10-CM | POA: Insufficient documentation

## 2020-08-22 DIAGNOSIS — C7802 Secondary malignant neoplasm of left lung: Secondary | ICD-10-CM | POA: Diagnosis not present

## 2020-08-22 DIAGNOSIS — Z5112 Encounter for antineoplastic immunotherapy: Secondary | ICD-10-CM | POA: Insufficient documentation

## 2020-08-22 DIAGNOSIS — R062 Wheezing: Secondary | ICD-10-CM | POA: Diagnosis not present

## 2020-08-22 DIAGNOSIS — Z803 Family history of malignant neoplasm of breast: Secondary | ICD-10-CM | POA: Diagnosis not present

## 2020-08-22 DIAGNOSIS — I10 Essential (primary) hypertension: Secondary | ICD-10-CM | POA: Insufficient documentation

## 2020-08-22 DIAGNOSIS — Z79899 Other long term (current) drug therapy: Secondary | ICD-10-CM | POA: Diagnosis not present

## 2020-08-22 DIAGNOSIS — C7800 Secondary malignant neoplasm of unspecified lung: Secondary | ICD-10-CM | POA: Diagnosis not present

## 2020-08-22 DIAGNOSIS — Z8249 Family history of ischemic heart disease and other diseases of the circulatory system: Secondary | ICD-10-CM | POA: Insufficient documentation

## 2020-08-22 DIAGNOSIS — Z170 Estrogen receptor positive status [ER+]: Secondary | ICD-10-CM

## 2020-08-22 DIAGNOSIS — M25569 Pain in unspecified knee: Secondary | ICD-10-CM | POA: Diagnosis not present

## 2020-08-22 DIAGNOSIS — B379 Candidiasis, unspecified: Secondary | ICD-10-CM | POA: Diagnosis not present

## 2020-08-22 DIAGNOSIS — Z8041 Family history of malignant neoplasm of ovary: Secondary | ICD-10-CM | POA: Diagnosis not present

## 2020-08-22 DIAGNOSIS — C50912 Malignant neoplasm of unspecified site of left female breast: Secondary | ICD-10-CM

## 2020-08-22 DIAGNOSIS — B373 Candidiasis of vulva and vagina: Secondary | ICD-10-CM | POA: Insufficient documentation

## 2020-08-22 DIAGNOSIS — C3490 Malignant neoplasm of unspecified part of unspecified bronchus or lung: Secondary | ICD-10-CM | POA: Diagnosis not present

## 2020-08-22 DIAGNOSIS — Z833 Family history of diabetes mellitus: Secondary | ICD-10-CM | POA: Diagnosis not present

## 2020-08-22 DIAGNOSIS — E669 Obesity, unspecified: Secondary | ICD-10-CM | POA: Diagnosis not present

## 2020-08-22 DIAGNOSIS — C50212 Malignant neoplasm of upper-inner quadrant of left female breast: Secondary | ICD-10-CM | POA: Diagnosis not present

## 2020-08-22 DIAGNOSIS — Z87891 Personal history of nicotine dependence: Secondary | ICD-10-CM | POA: Insufficient documentation

## 2020-08-22 DIAGNOSIS — I5020 Unspecified systolic (congestive) heart failure: Secondary | ICD-10-CM | POA: Diagnosis not present

## 2020-08-22 LAB — CMP (CANCER CENTER ONLY)
ALT: 83 U/L — ABNORMAL HIGH (ref 0–44)
AST: 51 U/L — ABNORMAL HIGH (ref 15–41)
Albumin: 3.2 g/dL — ABNORMAL LOW (ref 3.5–5.0)
Alkaline Phosphatase: 109 U/L (ref 38–126)
Anion gap: 9 (ref 5–15)
BUN: 13 mg/dL (ref 8–23)
CO2: 28 mmol/L (ref 22–32)
Calcium: 9.3 mg/dL (ref 8.9–10.3)
Chloride: 104 mmol/L (ref 98–111)
Creatinine: 0.81 mg/dL (ref 0.44–1.00)
GFR, Estimated: >60 mL/min (ref >60)
Glucose, Bld: 271 mg/dL — ABNORMAL HIGH (ref 70–99)
Potassium: 4.3 mmol/L (ref 3.5–5.1)
Sodium: 141 mmol/L (ref 135–145)
Total Bilirubin: 0.3 mg/dL (ref 0.3–1.2)
Total Protein: 7.5 g/dL (ref 6.5–8.1)

## 2020-08-22 LAB — CBC WITH DIFFERENTIAL (CANCER CENTER ONLY)
Abs Immature Granulocytes: 0.01 10*3/uL (ref 0.00–0.07)
Basophils Absolute: 0 10*3/uL (ref 0.0–0.1)
Basophils Relative: 0 %
Eosinophils Absolute: 0.1 10*3/uL (ref 0.0–0.5)
Eosinophils Relative: 2 %
HCT: 43.9 % (ref 36.0–46.0)
Hemoglobin: 13.9 g/dL (ref 12.0–15.0)
Immature Granulocytes: 0 %
Lymphocytes Relative: 28 %
Lymphs Abs: 2.1 10*3/uL (ref 0.7–4.0)
MCH: 28.1 pg (ref 26.0–34.0)
MCHC: 31.7 g/dL (ref 30.0–36.0)
MCV: 88.7 fL (ref 80.0–100.0)
Monocytes Absolute: 0.5 10*3/uL (ref 0.1–1.0)
Monocytes Relative: 7 %
Neutro Abs: 4.6 10*3/uL (ref 1.7–7.7)
Neutrophils Relative %: 63 %
Platelet Count: 226 10*3/uL (ref 150–400)
RBC: 4.95 MIL/uL (ref 3.87–5.11)
RDW: 14.3 % (ref 11.5–15.5)
WBC Count: 7.4 10*3/uL (ref 4.0–10.5)
nRBC: 0 % (ref 0.0–0.2)

## 2020-08-22 MED ORDER — HEPARIN SOD (PORK) LOCK FLUSH 100 UNIT/ML IV SOLN
500.0000 [IU] | Freq: Once | INTRAVENOUS | Status: AC | PRN
  Administered 2020-08-22: 500 [IU]
  Filled 2020-08-22: qty 5

## 2020-08-22 MED ORDER — DIPHENHYDRAMINE HCL 25 MG PO CAPS
25.0000 mg | ORAL_CAPSULE | Freq: Once | ORAL | Status: AC
  Administered 2020-08-22: 25 mg via ORAL

## 2020-08-22 MED ORDER — SODIUM CHLORIDE 0.9 % IV SOLN
Freq: Once | INTRAVENOUS | Status: AC
  Filled 2020-08-22: qty 250

## 2020-08-22 MED ORDER — ACETAMINOPHEN 325 MG PO TABS
ORAL_TABLET | ORAL | Status: AC
  Filled 2020-08-22: qty 2

## 2020-08-22 MED ORDER — ACETAMINOPHEN 325 MG PO TABS
650.0000 mg | ORAL_TABLET | Freq: Once | ORAL | Status: AC
  Administered 2020-08-22: 650 mg via ORAL

## 2020-08-22 MED ORDER — LORAZEPAM 2 MG/ML IJ SOLN
0.5000 mg | Freq: Once | INTRAMUSCULAR | Status: AC
  Administered 2020-08-22: 0.5 mg via INTRAVENOUS

## 2020-08-22 MED ORDER — SODIUM CHLORIDE 0.9% FLUSH
10.0000 mL | Freq: Once | INTRAVENOUS | Status: AC
  Administered 2020-08-22: 10 mL via INTRAVENOUS
  Filled 2020-08-22: qty 10

## 2020-08-22 MED ORDER — LORAZEPAM 2 MG/ML IJ SOLN
INTRAMUSCULAR | Status: AC
  Filled 2020-08-22: qty 1

## 2020-08-22 MED ORDER — FLUCONAZOLE 100 MG PO TABS
100.0000 mg | ORAL_TABLET | Freq: Every day | ORAL | 0 refills | Status: DC
Start: 2020-08-22 — End: 2021-01-16

## 2020-08-22 MED ORDER — SODIUM CHLORIDE 0.9 % IV SOLN
750.0000 mg | Freq: Once | INTRAVENOUS | Status: AC
Start: 2020-08-22 — End: 2020-08-22
  Administered 2020-08-22: 750 mg via INTRAVENOUS
  Filled 2020-08-22: qty 35.72

## 2020-08-22 MED ORDER — DIPHENHYDRAMINE HCL 25 MG PO CAPS
ORAL_CAPSULE | ORAL | Status: AC
  Filled 2020-08-22: qty 1

## 2020-08-22 NOTE — Patient Instructions (Signed)
[place] Discharge Instructions for Patients Receiving Chemotherapy  Today you received the following chemotherapy agents: Kanjinti  To help prevent nausea and vomiting after your treatment, we encourage you to take your nausea medication as directed.    If you develop nausea and vomiting that is not controlled by your nausea medication, call the clinic.   BELOW ARE SYMPTOMS THAT SHOULD BE REPORTED IMMEDIATELY:  *FEVER GREATER THAN 100.5 F  *CHILLS WITH OR WITHOUT FEVER  NAUSEA AND VOMITING THAT IS NOT CONTROLLED WITH YOUR NAUSEA MEDICATION  *UNUSUAL SHORTNESS OF BREATH  *UNUSUAL BRUISING OR BLEEDING  TENDERNESS IN MOUTH AND THROAT WITH OR WITHOUT PRESENCE OF ULCERS  *URINARY PROBLEMS  *BOWEL PROBLEMS  UNUSUAL RASH Items with * indicate a potential emergency and should be followed up as soon as possible.  Feel free to call the clinic should you have any questions or concerns. The clinic phone number is [phone].  Please show the [place] at check-in to the Emergency Department and triage nurse.

## 2020-08-22 NOTE — Patient Instructions (Signed)

## 2020-08-24 NOTE — Progress Notes (Signed)
Symptoms Management Clinic Progress Note   [name] [id number] 05/22/1951 70 y.o.  [name] is managed by Dr. 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 once daily for 7 days.  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  Follow-up appointments need to be scheduled for this patient.  Please see After Visit Summary for patient specific instructions.  No future appointments.  No orders of the defined types were placed in this encounter.      Subjective:   Patient ID:  [name] is a 70 y.o. (DOB 1950-09-02) female.  Chief Complaint:  Chief Complaint  Patient presents with  . Follow-up    HPI [name]  is a 70 y.o. female with a diagnosis of a metastatic left breast cancer with lung metastasis.  She is managed by Dr. Jana Hakim and presents to the clinic today for ongoing treatment with Kanjinti.  She reports having a vaginal yeast infection.  She is taking Azo and Monistat without good relief.  She reports that she has some back pain and fatigue.  She is otherwise doing well with no acute issues of concern.  She is ambulating with a wheelchair today.  She continues to use supplemental oxygen.   Medications: I have reviewed the patient's current medications.  Allergies:  Allergies  Allergen Reactions  . Meperidine Hcl Anaphylaxis  . Penicillins Anaphylaxis    Has patient had a PCN reaction causing immediate rash, facial/tongue/throat swelling, SOB or lightheadedness with hypotension: yes Has  patient had a PCN reaction causing severe rash involving mucus membranes or skin necrosis: no Has patient had a PCN reaction that required hospitalization yes Has patient had a PCN reaction occurring within the last 10 years: no If all of the above answers are "NO", then may proceed with Cephalosporin use.   Marland Kitchen Amoxicillin     REACTION: unspecified  . Aspirin Nausea And Vomiting    REACTION: unspecified  . Percocet [Oxycodone-Acetaminophen]     Past Medical History:  Diagnosis Date  . Arthritis   . Back pain   . Breast cancer ([name]) dx'd 11/2012   left  . Chest pain   . COPD (chronic obstructive pulmonary disease) ([place])   . Diabetes mellitus without complication ([place]) 02/01/736  . Ear pain   . Fatty liver 6/03  . Hypertension   . Lung disease   . Lung metastases ([place]) dx'd 11/2012  . Obesity, unspecified   . Other abnormal glucose   . Suicide attempt ([place]) 1996  . Syncope and collapse   . Unspecified sleep apnea     Past Surgical History:  Procedure Laterality Date  . CARDIAC CATHETERIZATION     2007  . CHOLECYSTECTOMY    . TUBAL LIGATION      Family History  Problem Relation Age of Onset  . Coronary artery disease Father 52  . Diabetes Father   . Heart disease Father   . Breast cancer Mother 77  . Cancer Mother 11       breast  . Aplastic anemia Daughter  died at age 2  . Cancer Maternal Aunt 40       ovarian  . Cancer Maternal Grandmother 55       ovarian  . Cancer Paternal Aunt 36       ovarian/breast/breast  . Coronary artery disease Sister 53  . Coronary artery disease Brother 64    Social History   Socioeconomic History  . Marital status: Divorced    Spouse name: Not on file  . Number of children: 4  . Years of education: some colle  . Highest education level: Not on file  Occupational History  . Occupation: Disability  . Occupation: retired-daycare  Tobacco Use  . Smoking status: Former Smoker    Packs/day: 3.00    Years: 5.00     Pack years: 15.00    Types: Cigarettes    Quit date: 08/18/1968    Years since quitting: 52.0  . Smokeless tobacco: Never Used  Vaping Use  . Vaping Use: Never used  Substance and Sexual Activity  . Alcohol use: No    Alcohol/week: 0.0 standard drinks  . Drug use: No  . Sexual activity: Not Currently  Other Topics Concern  . Not on file  Social History Narrative   Health Care POA:    Emergency Contact: Thomasenia Bottoms [phone]   End of Life Plan:    Who lives with you: two grandchildren, friend, daughter   Any pets: 3 poodles   Diet: Pt has a variety of protein, starch, vegetables.  Pt is currently working on cutting back on portions for weight loss.   Exercise: Pt has not regular exercise routine.  Occasionally walks around home.   Seatbelts: Pt reports wearing seatbelt occasionally.   Sun Exposure/Protection: Pt does not use sun protection   Hobbies: reading, playing on kindle, ebay         Currently in her home she keeps her granddaughter [name], 64, who is the daughter of the patient's daughter [name] (the patient refers to [name] as "my adopted daughter"); grandson Whitsel "[name]" Pun, [place], who is [name]'s half-brother; daughter [name], and an [place] friend, [name], the patient's significant other. Daughter [name] is a Network engineer, currently unemployed. Son [name] "[name]" Junior works as an Clinical biochemist in [place]. Daughter [name] is currently in prison due to killing someone in a car accident. Daughter [name] died from aplastic anemia at the age of 58. The patient has a total of 4 grandchildren. She is not a Ambulance person   Social Determinants of Radio broadcast assistant Strain: Not on file  Food Insecurity: Not on file  Transportation Needs: Not on file  Physical Activity: Not on file  Stress: Not on file  Social Connections: Not on file  Intimate Partner Violence: Not on file    Past Medical History, Surgical history, Social  history, and Family history were reviewed and updated as appropriate.   Please see review of systems for further details on the patient's review from today.   Review of Systems:  Review of Systems  Constitutional: Positive for fatigue. Negative for chills, diaphoresis and fever.  HENT: Negative for trouble swallowing and voice change.   Respiratory: Negative for cough, chest tightness, shortness of breath and wheezing.   Cardiovascular: Negative for chest pain and palpitations.  Gastrointestinal: Negative for abdominal pain, constipation, diarrhea, nausea and vomiting.  Genitourinary: Positive for vaginal discharge. Negative for dysuria.  Musculoskeletal: Negative for back pain and myalgias.  Neurological: Negative for dizziness, light-headedness and headaches.  Objective:   Physical Exam:  BP (!) 175/62 (BP Location: Right Arm, Patient Position: Sitting)   Pulse 78   Temp 98.6 F (37 C) (Tympanic)   Resp 20   Ht 5\' 4"  (1.626 m)   Wt (!) 317 lb 1.6 oz (143.8 kg)   SpO2 95%   BMI 54.43 kg/m  ECOG: 1  Physical Exam Constitutional:      General: She is not in acute distress.    Appearance: She is not ill-appearing, toxic-appearing or diaphoretic.     Comments: She is receiving oxygen via nasal cannula.  HENT:     Head: Normocephalic and atraumatic.  Eyes:     General: No scleral icterus.       Right eye: No discharge.        Left eye: No discharge.     Conjunctiva/sclera: Conjunctivae normal.  Cardiovascular:     Rate and Rhythm: Normal rate and regular rhythm.     Heart sounds: No murmur heard. No friction rub. No gallop.   Pulmonary:     Effort: Pulmonary effort is normal. No respiratory distress.     Breath sounds: Normal breath sounds. No wheezing, rhonchi or rales.  Musculoskeletal:     Right lower leg: Edema present.     Left lower leg: Edema present.     Comments: Bilateral lower extremity edema.  Skin:    General: Skin is dry.     Coloration: Skin is not  jaundiced or pale.     Findings: No bruising, erythema, lesion or rash.  Neurological:     Mental Status: She is alert.     Coordination: Coordination normal.     Gait: Gait abnormal (The patient is ambulating with the use of a wheelchair.).  Psychiatric:        Mood and Affect: Mood normal.        Behavior: Behavior normal.        Thought Content: Thought content normal.        Judgment: Judgment normal.     Lab Review:     Component Value Date/Time   NA 141 08/22/2020 1224   NA 141 08/05/2017 0927   K 4.3 08/22/2020 1224   K 4.0 08/05/2017 0927   CL 104 08/22/2020 1224   CL 101 02/07/2013 1125   CO2 28 08/22/2020 1224   CO2 31 (H) 08/05/2017 0927   GLUCOSE 271 (H) 08/22/2020 1224   GLUCOSE 125 08/05/2017 0927   GLUCOSE 193 (H) 02/07/2013 1125   BUN 13 08/22/2020 1224   BUN 22.7 08/05/2017 0927   CREATININE 0.81 08/22/2020 1224   CREATININE 0.8 08/05/2017 0927   CALCIUM 9.3 08/22/2020 1224   CALCIUM 9.7 08/05/2017 0927   PROT 7.5 08/22/2020 1224   PROT 7.3 08/05/2017 0927   ALBUMIN 3.2 (L) 08/22/2020 1224   ALBUMIN 3.2 (L) 08/05/2017 0927   AST 51 (H) 08/22/2020 1224   AST 19 08/05/2017 0927   ALT 83 (H) 08/22/2020 1224   ALT 28 08/05/2017 0927   ALKPHOS 109 08/22/2020 1224   ALKPHOS 108 08/05/2017 0927   BILITOT 0.3 08/22/2020 1224   BILITOT 0.34 08/05/2017 0927   GFRNONAA >60 08/22/2020 1224   GFRAA >60 05/10/2020 0447   GFRAA >60 04/18/2020 1023       Component Value Date/Time   WBC 7.4 08/22/2020 1224   WBC 8.0 05/10/2020 0447   RBC 4.95 08/22/2020 1224   HGB 13.9 08/22/2020 1224   HGB 12.8 08/05/2017 0927  HCT 43.9 08/22/2020 1224   HCT 39.8 08/05/2017 0927   PLT 226 08/22/2020 1224   PLT 267 08/05/2017 0927   MCV 88.7 08/22/2020 1224   MCV 86.7 08/05/2017 0927   MCH 28.1 08/22/2020 1224   MCHC 31.7 08/22/2020 1224   RDW 14.3 08/22/2020 1224   RDW 14.3 08/05/2017 0927   LYMPHSABS 2.1 08/22/2020 1224   LYMPHSABS 2.3 08/05/2017 0927   MONOABS  0.5 08/22/2020 1224   MONOABS 0.4 08/05/2017 0927   EOSABS 0.1 08/22/2020 1224   EOSABS 0.1 08/05/2017 0927   BASOSABS 0.0 08/22/2020 1224   BASOSABS 0.0 08/05/2017 0927   -------------------------------  Imaging from last 24 hours (if applicable):  Radiology interpretation: No results found.

## 2020-08-28 ENCOUNTER — Other Ambulatory Visit: Payer: Self-pay | Admitting: Oncology

## 2020-08-29 ENCOUNTER — Telehealth: Payer: Self-pay | Admitting: Oncology

## 2020-08-29 NOTE — Telephone Encounter (Signed)
Scheduled apt per 1/11 sch msg - pt is as aware of appt.

## 2020-09-09 DIAGNOSIS — C3490 Malignant neoplasm of unspecified part of unspecified bronchus or lung: Secondary | ICD-10-CM | POA: Diagnosis not present

## 2020-09-09 DIAGNOSIS — U071 COVID-19: Secondary | ICD-10-CM | POA: Diagnosis not present

## 2020-09-09 DIAGNOSIS — M25569 Pain in unspecified knee: Secondary | ICD-10-CM | POA: Diagnosis not present

## 2020-09-09 DIAGNOSIS — I5020 Unspecified systolic (congestive) heart failure: Secondary | ICD-10-CM | POA: Diagnosis not present

## 2020-09-09 DIAGNOSIS — R062 Wheezing: Secondary | ICD-10-CM | POA: Diagnosis not present

## 2020-09-10 DIAGNOSIS — M25569 Pain in unspecified knee: Secondary | ICD-10-CM | POA: Diagnosis not present

## 2020-09-10 DIAGNOSIS — R062 Wheezing: Secondary | ICD-10-CM | POA: Diagnosis not present

## 2020-09-10 DIAGNOSIS — I5020 Unspecified systolic (congestive) heart failure: Secondary | ICD-10-CM | POA: Diagnosis not present

## 2020-09-10 DIAGNOSIS — U071 COVID-19: Secondary | ICD-10-CM | POA: Diagnosis not present

## 2020-09-10 DIAGNOSIS — C3490 Malignant neoplasm of unspecified part of unspecified bronchus or lung: Secondary | ICD-10-CM | POA: Diagnosis not present

## 2020-09-14 ENCOUNTER — Other Ambulatory Visit: Payer: Self-pay | Admitting: Family Medicine

## 2020-09-19 ENCOUNTER — Inpatient Hospital Stay: Payer: Medicare Other | Attending: Oncology

## 2020-09-19 ENCOUNTER — Inpatient Hospital Stay: Payer: Medicare Other

## 2020-09-19 ENCOUNTER — Other Ambulatory Visit: Payer: Self-pay

## 2020-09-19 VITALS — BP 141/62 | HR 67 | Temp 98.6°F | Resp 19

## 2020-09-19 DIAGNOSIS — C7800 Secondary malignant neoplasm of unspecified lung: Secondary | ICD-10-CM | POA: Insufficient documentation

## 2020-09-19 DIAGNOSIS — Z8041 Family history of malignant neoplasm of ovary: Secondary | ICD-10-CM | POA: Insufficient documentation

## 2020-09-19 DIAGNOSIS — Z87891 Personal history of nicotine dependence: Secondary | ICD-10-CM | POA: Insufficient documentation

## 2020-09-19 DIAGNOSIS — Z8249 Family history of ischemic heart disease and other diseases of the circulatory system: Secondary | ICD-10-CM | POA: Diagnosis not present

## 2020-09-19 DIAGNOSIS — Z95828 Presence of other vascular implants and grafts: Secondary | ICD-10-CM

## 2020-09-19 DIAGNOSIS — Z79899 Other long term (current) drug therapy: Secondary | ICD-10-CM | POA: Insufficient documentation

## 2020-09-19 DIAGNOSIS — Z833 Family history of diabetes mellitus: Secondary | ICD-10-CM | POA: Diagnosis not present

## 2020-09-19 DIAGNOSIS — E119 Type 2 diabetes mellitus without complications: Secondary | ICD-10-CM | POA: Insufficient documentation

## 2020-09-19 DIAGNOSIS — C50212 Malignant neoplasm of upper-inner quadrant of left female breast: Secondary | ICD-10-CM | POA: Diagnosis not present

## 2020-09-19 DIAGNOSIS — J449 Chronic obstructive pulmonary disease, unspecified: Secondary | ICD-10-CM | POA: Diagnosis not present

## 2020-09-19 DIAGNOSIS — Z170 Estrogen receptor positive status [ER+]: Secondary | ICD-10-CM

## 2020-09-19 DIAGNOSIS — Z5112 Encounter for antineoplastic immunotherapy: Secondary | ICD-10-CM | POA: Insufficient documentation

## 2020-09-19 DIAGNOSIS — Z803 Family history of malignant neoplasm of breast: Secondary | ICD-10-CM | POA: Diagnosis not present

## 2020-09-19 DIAGNOSIS — I10 Essential (primary) hypertension: Secondary | ICD-10-CM | POA: Diagnosis not present

## 2020-09-19 LAB — CBC WITH DIFFERENTIAL (CANCER CENTER ONLY)
Abs Immature Granulocytes: 0.02 10*3/uL (ref 0.00–0.07)
Basophils Absolute: 0 10*3/uL (ref 0.0–0.1)
Basophils Relative: 0 %
Eosinophils Absolute: 0.1 10*3/uL (ref 0.0–0.5)
Eosinophils Relative: 1 %
HCT: 43 % (ref 36.0–46.0)
Hemoglobin: 14.3 g/dL (ref 12.0–15.0)
Immature Granulocytes: 0 %
Lymphocytes Relative: 32 %
Lymphs Abs: 2.6 10*3/uL (ref 0.7–4.0)
MCH: 28.2 pg (ref 26.0–34.0)
MCHC: 33.3 g/dL (ref 30.0–36.0)
MCV: 84.8 fL (ref 80.0–100.0)
Monocytes Absolute: 0.6 10*3/uL (ref 0.1–1.0)
Monocytes Relative: 7 %
Neutro Abs: 4.7 10*3/uL (ref 1.7–7.7)
Neutrophils Relative %: 60 %
Platelet Count: 218 10*3/uL (ref 150–400)
RBC: 5.07 MIL/uL (ref 3.87–5.11)
RDW: 13.7 % (ref 11.5–15.5)
WBC Count: 8 10*3/uL (ref 4.0–10.5)
nRBC: 0 % (ref 0.0–0.2)

## 2020-09-19 LAB — CMP (CANCER CENTER ONLY)
ALT: 68 U/L — ABNORMAL HIGH (ref 0–44)
AST: 37 U/L (ref 15–41)
Albumin: 3.2 g/dL — ABNORMAL LOW (ref 3.5–5.0)
Alkaline Phosphatase: 111 U/L (ref 38–126)
Anion gap: 8 (ref 5–15)
BUN: 14 mg/dL (ref 8–23)
CO2: 30 mmol/L (ref 22–32)
Calcium: 9.2 mg/dL (ref 8.9–10.3)
Chloride: 101 mmol/L (ref 98–111)
Creatinine: 0.79 mg/dL (ref 0.44–1.00)
GFR, Estimated: >60 mL/min (ref >60)
Glucose, Bld: 261 mg/dL — ABNORMAL HIGH (ref 70–99)
Potassium: 4 mmol/L (ref 3.5–5.1)
Sodium: 139 mmol/L (ref 135–145)
Total Bilirubin: 0.4 mg/dL (ref 0.3–1.2)
Total Protein: 7.2 g/dL (ref 6.5–8.1)

## 2020-09-19 MED ORDER — HEPARIN SOD (PORK) LOCK FLUSH 100 UNIT/ML IV SOLN
500.0000 [IU] | Freq: Once | INTRAVENOUS | Status: AC | PRN
  Administered 2020-09-19: 500 [IU]
  Filled 2020-09-19: qty 5

## 2020-09-19 MED ORDER — SODIUM CHLORIDE 0.9 % IV SOLN
Freq: Once | INTRAVENOUS | Status: AC
  Filled 2020-09-19: qty 250

## 2020-09-19 MED ORDER — DIPHENHYDRAMINE HCL 25 MG PO CAPS
ORAL_CAPSULE | ORAL | Status: AC
  Filled 2020-09-19: qty 1

## 2020-09-19 MED ORDER — TRASTUZUMAB-ANNS CHEMO 150 MG IV SOLR
750.0000 mg | Freq: Once | INTRAVENOUS | Status: AC
  Administered 2020-09-19: 750 mg via INTRAVENOUS
  Filled 2020-09-19: qty 35.72

## 2020-09-19 MED ORDER — LORAZEPAM 2 MG/ML IJ SOLN
0.5000 mg | Freq: Once | INTRAMUSCULAR | Status: AC
  Administered 2020-09-19: 0.5 mg via INTRAVENOUS

## 2020-09-19 MED ORDER — SODIUM CHLORIDE 0.9% FLUSH
10.0000 mL | Freq: Once | INTRAVENOUS | Status: AC
  Administered 2020-09-19: 10 mL
  Filled 2020-09-19: qty 10

## 2020-09-19 MED ORDER — LORAZEPAM 2 MG/ML IJ SOLN
INTRAMUSCULAR | Status: AC
  Filled 2020-09-19: qty 1

## 2020-09-19 MED ORDER — SODIUM CHLORIDE 0.9% FLUSH
10.0000 mL | Freq: Once | INTRAVENOUS | Status: AC
  Administered 2020-09-19: 10 mL via INTRAVENOUS
  Filled 2020-09-19: qty 10

## 2020-09-19 MED ORDER — ACETAMINOPHEN 325 MG PO TABS
ORAL_TABLET | ORAL | Status: AC
  Filled 2020-09-19: qty 2

## 2020-09-19 MED ORDER — ACETAMINOPHEN 325 MG PO TABS
650.0000 mg | ORAL_TABLET | Freq: Once | ORAL | Status: AC
  Administered 2020-09-19: 650 mg via ORAL

## 2020-09-19 MED ORDER — DIPHENHYDRAMINE HCL 25 MG PO CAPS
25.0000 mg | ORAL_CAPSULE | Freq: Once | ORAL | Status: AC
  Administered 2020-09-19: 25 mg via ORAL

## 2020-09-19 NOTE — Patient Instructions (Signed)

## 2020-09-19 NOTE — Patient Instructions (Signed)
[facility] °Discharge Instructions for Patients Receiving Chemotherapy ° °Today you received the following chemotherapy agents Trastuzumab ° °To help prevent nausea and vomiting after your treatment, we encourage you to take your nausea medication as directed. °  °If you develop nausea and vomiting that is not controlled by your nausea medication, call the clinic.  ° °BELOW ARE SYMPTOMS THAT SHOULD BE REPORTED IMMEDIATELY: °· *FEVER GREATER THAN 100.5 F °· *CHILLS WITH OR WITHOUT FEVER °· NAUSEA AND VOMITING THAT IS NOT CONTROLLED WITH YOUR NAUSEA MEDICATION °· *UNUSUAL SHORTNESS OF BREATH °· *UNUSUAL BRUISING OR BLEEDING °· TENDERNESS IN MOUTH AND THROAT WITH OR WITHOUT PRESENCE OF ULCERS °· *URINARY PROBLEMS °· *BOWEL PROBLEMS °· UNUSUAL RASH °Items with * indicate a potential emergency and should be followed up as soon as possible. ° °Feel free to call the clinic should you have any questions or concerns. The clinic phone number is [phone]. ° °Please show the CHEMO ALERT CARD at check-in to the Emergency Department and triage nurse. ° ° °

## 2020-09-20 ENCOUNTER — Other Ambulatory Visit: Payer: Self-pay | Admitting: *Deleted

## 2020-09-20 MED ORDER — LOPERAMIDE HCL 2 MG PO CAPS: ORAL_CAPSULE | ORAL | 1 refills | Status: DC

## 2020-10-10 DIAGNOSIS — I5020 Unspecified systolic (congestive) heart failure: Secondary | ICD-10-CM | POA: Diagnosis not present

## 2020-10-10 DIAGNOSIS — M25569 Pain in unspecified knee: Secondary | ICD-10-CM | POA: Diagnosis not present

## 2020-10-10 DIAGNOSIS — R062 Wheezing: Secondary | ICD-10-CM | POA: Diagnosis not present

## 2020-10-10 DIAGNOSIS — C3490 Malignant neoplasm of unspecified part of unspecified bronchus or lung: Secondary | ICD-10-CM | POA: Diagnosis not present

## 2020-10-10 DIAGNOSIS — U071 COVID-19: Secondary | ICD-10-CM | POA: Diagnosis not present

## 2020-10-11 DIAGNOSIS — I5020 Unspecified systolic (congestive) heart failure: Secondary | ICD-10-CM | POA: Diagnosis not present

## 2020-10-11 DIAGNOSIS — U071 COVID-19: Secondary | ICD-10-CM | POA: Diagnosis not present

## 2020-10-11 DIAGNOSIS — C3490 Malignant neoplasm of unspecified part of unspecified bronchus or lung: Secondary | ICD-10-CM | POA: Diagnosis not present

## 2020-10-11 DIAGNOSIS — M25569 Pain in unspecified knee: Secondary | ICD-10-CM | POA: Diagnosis not present

## 2020-10-11 DIAGNOSIS — R062 Wheezing: Secondary | ICD-10-CM | POA: Diagnosis not present

## 2020-10-17 ENCOUNTER — Other Ambulatory Visit: Payer: Self-pay

## 2020-10-17 ENCOUNTER — Other Ambulatory Visit: Payer: Medicare Other

## 2020-10-17 ENCOUNTER — Inpatient Hospital Stay: Payer: Medicare Other | Attending: Oncology

## 2020-10-17 ENCOUNTER — Inpatient Hospital Stay: Payer: Medicare Other

## 2020-10-17 ENCOUNTER — Inpatient Hospital Stay (HOSPITAL_BASED_OUTPATIENT_CLINIC_OR_DEPARTMENT_OTHER): Payer: Medicare Other | Admitting: Medical

## 2020-10-17 VITALS — BP 123/56 | HR 68 | Temp 98.1°F | Resp 18

## 2020-10-17 DIAGNOSIS — C7800 Secondary malignant neoplasm of unspecified lung: Secondary | ICD-10-CM | POA: Insufficient documentation

## 2020-10-17 DIAGNOSIS — J449 Chronic obstructive pulmonary disease, unspecified: Secondary | ICD-10-CM | POA: Insufficient documentation

## 2020-10-17 DIAGNOSIS — C50212 Malignant neoplasm of upper-inner quadrant of left female breast: Secondary | ICD-10-CM

## 2020-10-17 DIAGNOSIS — Z5112 Encounter for antineoplastic immunotherapy: Secondary | ICD-10-CM | POA: Insufficient documentation

## 2020-10-17 DIAGNOSIS — Z803 Family history of malignant neoplasm of breast: Secondary | ICD-10-CM | POA: Diagnosis not present

## 2020-10-17 DIAGNOSIS — Z170 Estrogen receptor positive status [ER+]: Secondary | ICD-10-CM | POA: Insufficient documentation

## 2020-10-17 DIAGNOSIS — Z833 Family history of diabetes mellitus: Secondary | ICD-10-CM | POA: Diagnosis not present

## 2020-10-17 DIAGNOSIS — Z87891 Personal history of nicotine dependence: Secondary | ICD-10-CM | POA: Diagnosis not present

## 2020-10-17 DIAGNOSIS — Z8249 Family history of ischemic heart disease and other diseases of the circulatory system: Secondary | ICD-10-CM | POA: Diagnosis not present

## 2020-10-17 DIAGNOSIS — Z8041 Family history of malignant neoplasm of ovary: Secondary | ICD-10-CM | POA: Diagnosis not present

## 2020-10-17 DIAGNOSIS — Z95828 Presence of other vascular implants and grafts: Secondary | ICD-10-CM

## 2020-10-17 DIAGNOSIS — I10 Essential (primary) hypertension: Secondary | ICD-10-CM | POA: Diagnosis not present

## 2020-10-17 DIAGNOSIS — C50919 Malignant neoplasm of unspecified site of unspecified female breast: Secondary | ICD-10-CM | POA: Diagnosis not present

## 2020-10-17 DIAGNOSIS — E119 Type 2 diabetes mellitus without complications: Secondary | ICD-10-CM | POA: Insufficient documentation

## 2020-10-17 DIAGNOSIS — G4730 Sleep apnea, unspecified: Secondary | ICD-10-CM | POA: Insufficient documentation

## 2020-10-17 DIAGNOSIS — Z79811 Long term (current) use of aromatase inhibitors: Secondary | ICD-10-CM | POA: Insufficient documentation

## 2020-10-17 LAB — CMP (CANCER CENTER ONLY)
ALT: 93 U/L — ABNORMAL HIGH (ref 0–44)
AST: 65 U/L — ABNORMAL HIGH (ref 15–41)
Albumin: 3.2 g/dL — ABNORMAL LOW (ref 3.5–5.0)
Alkaline Phosphatase: 124 U/L (ref 38–126)
Anion gap: 8 (ref 5–15)
BUN: 12 mg/dL (ref 8–23)
CO2: 29 mmol/L (ref 22–32)
Calcium: 9.3 mg/dL (ref 8.9–10.3)
Chloride: 103 mmol/L (ref 98–111)
Creatinine: 0.82 mg/dL (ref 0.44–1.00)
GFR, Estimated: >60 mL/min (ref >60)
Glucose, Bld: 307 mg/dL — ABNORMAL HIGH (ref 70–99)
Potassium: 4.3 mmol/L (ref 3.5–5.1)
Sodium: 140 mmol/L (ref 135–145)
Total Bilirubin: 0.3 mg/dL (ref 0.3–1.2)
Total Protein: 7.4 g/dL (ref 6.5–8.1)

## 2020-10-17 LAB — CBC WITH DIFFERENTIAL (CANCER CENTER ONLY)
Abs Immature Granulocytes: 0.02 10*3/uL (ref 0.00–0.07)
Basophils Absolute: 0 10*3/uL (ref 0.0–0.1)
Basophils Relative: 0 %
Eosinophils Absolute: 0.1 10*3/uL (ref 0.0–0.5)
Eosinophils Relative: 1 %
HCT: 43.9 % (ref 36.0–46.0)
Hemoglobin: 14.4 g/dL (ref 12.0–15.0)
Immature Granulocytes: 0 %
Lymphocytes Relative: 32 %
Lymphs Abs: 2.3 10*3/uL (ref 0.7–4.0)
MCH: 27.9 pg (ref 26.0–34.0)
MCHC: 32.8 g/dL (ref 30.0–36.0)
MCV: 84.9 fL (ref 80.0–100.0)
Monocytes Absolute: 0.6 10*3/uL (ref 0.1–1.0)
Monocytes Relative: 8 %
Neutro Abs: 4.2 10*3/uL (ref 1.7–7.7)
Neutrophils Relative %: 59 %
Platelet Count: 218 10*3/uL (ref 150–400)
RBC: 5.17 MIL/uL — ABNORMAL HIGH (ref 3.87–5.11)
RDW: 14.6 % (ref 11.5–15.5)
WBC Count: 7.2 10*3/uL (ref 4.0–10.5)
nRBC: 0 % (ref 0.0–0.2)

## 2020-10-17 MED ORDER — LORAZEPAM 2 MG/ML IJ SOLN
INTRAMUSCULAR | Status: AC
  Filled 2020-10-17: qty 1

## 2020-10-17 MED ORDER — DIPHENHYDRAMINE HCL 25 MG PO CAPS
25.0000 mg | ORAL_CAPSULE | Freq: Once | ORAL | Status: AC
  Administered 2020-10-17: 25 mg via ORAL

## 2020-10-17 MED ORDER — SODIUM CHLORIDE 0.9 % IV SOLN
Freq: Once | INTRAVENOUS | Status: AC
  Filled 2020-10-17: qty 250

## 2020-10-17 MED ORDER — LORAZEPAM 2 MG/ML IJ SOLN
0.5000 mg | Freq: Once | INTRAMUSCULAR | Status: AC
  Administered 2020-10-17: 0.5 mg via INTRAVENOUS

## 2020-10-17 MED ORDER — TRASTUZUMAB-ANNS CHEMO 150 MG IV SOLR
750.0000 mg | Freq: Once | INTRAVENOUS | Status: AC
Start: 2020-10-17 — End: 2020-10-17
  Administered 2020-10-17: 750 mg via INTRAVENOUS
  Filled 2020-10-17: qty 35.72

## 2020-10-17 MED ORDER — SODIUM CHLORIDE 0.9% FLUSH
10.0000 mL | Freq: Once | INTRAVENOUS | Status: AC
  Administered 2020-10-17: 10 mL via INTRAVENOUS
  Filled 2020-10-17: qty 10

## 2020-10-17 MED ORDER — DIPHENHYDRAMINE HCL 25 MG PO CAPS
ORAL_CAPSULE | ORAL | Status: AC
  Filled 2020-10-17: qty 2

## 2020-10-17 MED ORDER — HEPARIN SOD (PORK) LOCK FLUSH 100 UNIT/ML IV SOLN
500.0000 [IU] | Freq: Once | INTRAVENOUS | Status: AC | PRN
  Administered 2020-10-17: 500 [IU]
  Filled 2020-10-17: qty 5

## 2020-10-17 MED ORDER — ACETAMINOPHEN 325 MG PO TABS
650.0000 mg | ORAL_TABLET | Freq: Once | ORAL | Status: AC
  Administered 2020-10-17: 650 mg via ORAL

## 2020-10-17 MED ORDER — SODIUM CHLORIDE 0.9% FLUSH
10.0000 mL | Freq: Once | INTRAVENOUS | Status: AC
  Administered 2020-10-17: 10 mL
  Filled 2020-10-17: qty 10

## 2020-10-17 MED ORDER — ACETAMINOPHEN 325 MG PO TABS
ORAL_TABLET | ORAL | Status: AC
  Filled 2020-10-17: qty 2

## 2020-10-17 NOTE — Progress Notes (Signed)
err

## 2020-10-17 NOTE — Progress Notes (Signed)
Last 2Decho done August 2021. Ok to treat per Dr. Jana Hakim. 2D echo to be set up.

## 2020-10-17 NOTE — Patient Instructions (Signed)
[facility] Discharge Instructions for Patients Receiving Chemotherapy  Today you received the following chemotherapy agents trastuzumab.  To help prevent nausea and vomiting after your treatment, we encourage you to take your nausea medication as directed.    If you develop nausea and vomiting that is not controlled by your nausea medication, call the clinic.   BELOW ARE SYMPTOMS THAT SHOULD BE REPORTED IMMEDIATELY:  *FEVER GREATER THAN 100.5 F  *CHILLS WITH OR WITHOUT FEVER  NAUSEA AND VOMITING THAT IS NOT CONTROLLED WITH YOUR NAUSEA MEDICATION  *UNUSUAL SHORTNESS OF BREATH  *UNUSUAL BRUISING OR BLEEDING  TENDERNESS IN MOUTH AND THROAT WITH OR WITHOUT PRESENCE OF ULCERS  *URINARY PROBLEMS  *BOWEL PROBLEMS  UNUSUAL RASH Items with * indicate a potential emergency and should be followed up as soon as possible.  Feel free to call the clinic should you have any questions or concerns. The clinic phone number is [phone].  Please show the CHEMO ALERT CARD at check-in to the Emergency Department and triage nurse.   

## 2020-10-17 NOTE — Patient Instructions (Signed)

## 2020-10-17 NOTE — Progress Notes (Signed)
Symptoms Management Clinic Progress Note   SIMRAT KENDRICK [id number] 02/13/1951 70 y.o.  [name] is managed by Dr. Jana Hakim  Actively treated with chemotherapy/immunotherapy/hormonal therapy: yes  Current therapy: Kanjinti and letrozole  Last treated: 09/19/2020 (cycle 117, day 1)  Next scheduled appointment with provider: No follow-up appointment yet scheduled  Assessment: Plan:    Carcinoma of breast metastatic to lung, unspecified laterality ([place])  Malignant neoplasm of upper-inner quadrant of left breast in female, estrogen receptor positive ([place])   ER positive metastatic breast cancer with lung metastasis: Ms. Fornes continues to be followed by Dr. Jana Hakim and presents to the clinic today for consideration of cycle 118, day 1 of [place].  She continues on letrozole.  She has no follow-up appointment yet scheduled.  She inquires today if she can stop having echocardiograms done as it is difficult for her and painful to get up on the exam table.  I told her that this question would be referred to Dr. Jana Hakim.  Please see After Visit Summary for patient specific instructions.  Future Appointments  Date Time Provider [place]  11/14/2020 12:15 PM [facility] LAB [facility] None  11/14/2020 12:30 PM [facility] [place] FLUSH [facility] None  11/14/2020  1:30 PM [facility] INFUSION [facility] None  12/12/2020 12:15 PM [facility] LAB [facility] None  12/12/2020 12:30 PM [facility] [place] FLUSH [facility] None  12/12/2020  1:30 PM [facility] INFUSION [facility] None  01/09/2021 11:15 AM [facility] LAB [facility] None  01/09/2021 11:30 AM [facility] [place] FLUSH [facility] None  01/09/2021 12:30 PM [facility] INFUSION [facility] None  02/06/2021 12:00 PM [facility] LAB [facility] None  02/06/2021 12:15 PM [facility] [place] FLUSH [facility] None  02/06/2021  1:15 PM [facility] INFUSION [facility] None    No orders of the defined types were placed in this encounter.       Subjective:   Patient ID:  SCOTLYN MCCRANIE is a 70 y.o. (DOB Sep 03, 1950) female.  Chief Complaint: No chief complaint on file.   HPI BLESSYN SOMMERVILLE  is a 70 y.o. female with a diagnosis of an ER positive metastatic breast cancer with lung metastasis.  She is managed by Dr. Jana Hakim and presents to the clinic today for consideration of cycle 118, day 1 of [place].  She continues on letrozole.  She reports that she is doing well overall with no acute issues of concern.  She continues to tolerate chemotherapy well.  She inquires today if she can stop having echocardiograms done as it is difficult for her and painful to get up on the exam table.  I told her that this question would be referred to Dr. Jana Hakim.  Medications: I have reviewed the patient's current medications.  Allergies:  Allergies  Allergen Reactions  . Meperidine Hcl Anaphylaxis  . Penicillins Anaphylaxis    Has patient had a PCN reaction causing immediate rash, facial/tongue/throat swelling, SOB or lightheadedness with hypotension: yes Has patient had a PCN reaction causing severe rash involving mucus membranes or skin necrosis: no Has patient had a PCN reaction that required hospitalization yes Has patient had a PCN reaction occurring within the last 10 years: no If all of the above answers are "NO", then may proceed with Cephalosporin use.   Marland Kitchen Amoxicillin     REACTION: unspecified  . Aspirin Nausea And Vomiting    REACTION: unspecified  . Percocet [Oxycodone-Acetaminophen]     Past Medical History:  Diagnosis Date  . Arthritis   . Back pain   . Breast cancer ([place]) dx'd 11/2012  left  . Chest pain   . COPD (chronic obstructive pulmonary disease) ([place])   . Diabetes mellitus without complication ([place]) 02/05/3085  . Ear pain   . Fatty liver 6/03  . Hypertension   . Lung disease   . Lung metastases ([place]) dx'd 11/2012  . Obesity, unspecified   . Other abnormal glucose   . Suicide attempt ([place]) 1996  . Syncope and  collapse   . Unspecified sleep apnea     Past Surgical History:  Procedure Laterality Date  . CARDIAC CATHETERIZATION     2007  . CHOLECYSTECTOMY    . TUBAL LIGATION      Family History  Problem Relation Age of Onset  . Coronary artery disease Father 56  . Diabetes Father   . Heart disease Father   . Breast cancer Mother 58  . Cancer Mother 61       breast  . Aplastic anemia Daughter        died at age 63  . Cancer Maternal Aunt 40       ovarian  . Cancer Maternal Grandmother 55       ovarian  . Cancer Paternal Aunt 64       ovarian/breast/breast  . Coronary artery disease Sister 79  . Coronary artery disease Brother 81    Social History   Socioeconomic History  . Marital status: Divorced    Spouse name: Not on file  . Number of children: 4  . Years of education: some colle  . Highest education level: Not on file  Occupational History  . Occupation: Disability  . Occupation: retired-daycare  Tobacco Use  . Smoking status: Former Smoker    Packs/day: 3.00    Years: 5.00    Pack years: 15.00    Types: Cigarettes    Quit date: 08/18/1968    Years since quitting: 52.2  . Smokeless tobacco: Never Used  Vaping Use  . Vaping Use: Never used  Substance and Sexual Activity  . Alcohol use: No    Alcohol/week: 0.0 standard drinks  . Drug use: No  . Sexual activity: Not Currently  Other Topics Concern  . Not on file  Social History Narrative   Health Care POA:    Emergency Contact: Thomasenia Bottoms [phone]   End of Life Plan:    Who lives with you: two grandchildren, friend, daughter   Any pets: 3 poodles   Diet: Pt has a variety of protein, starch, vegetables.  Pt is currently working on cutting back on portions for weight loss.   Exercise: Pt has not regular exercise routine.  Occasionally walks around home.   Seatbelts: Pt reports wearing seatbelt occasionally.   Sun Exposure/Protection: Pt does not use sun protection   Hobbies: reading, playing on kindle, ebay          Currently in her home she keeps her granddaughter [name], 71, who is the daughter of the patient's daughter [name] (the patient refers to [name] as "my adopted daughter"); grandson Fitzwater "[name]" Isais, [place], who is [name]'s half-brother; [name], and an [place] friend, [name], the patient's significant other. [name] is a Network engineer, currently unemployed. Son [name] works as an Clinical biochemist in [place]. Daughter [name] is currently in prison due to killing someone in a car accident. Daughter [name] died from aplastic anemia at the age of 33. The patient has a total of 4 grandchildren. She is not a church attender   Social Determinants of  Health   Financial Resource Strain: Not on file  Food Insecurity: Not on file  Transportation Needs: Not on file  Physical Activity: Not on file  Stress: Not on file  Social Connections: Not on file  Intimate Partner Violence: Not on file    Past Medical History, Surgical history, Social history, and Family history were reviewed and updated as appropriate.   Please see review of systems for further details on the patient's review from today.   Review of Systems:  Review of Systems  Constitutional: Negative for chills, diaphoresis and fever.  HENT: Negative for trouble swallowing and voice change.   Respiratory: Negative for cough, chest tightness, shortness of breath and wheezing.   Cardiovascular: Negative for chest pain and palpitations.  Gastrointestinal: Negative for abdominal pain, constipation, diarrhea, nausea and vomiting.  Musculoskeletal: Negative for back pain and myalgias.  Neurological: Negative for dizziness, light-headedness and headaches.    Objective:   Physical Exam:  There were no vitals taken for this visit. ECOG: 1  Physical Exam Constitutional:      General: She is not in acute distress.    Appearance: She is not diaphoretic.  HENT:      Head: Normocephalic and atraumatic.  Eyes:     General: No scleral icterus.       Right eye: No discharge.        Left eye: No discharge.     Conjunctiva/sclera: Conjunctivae normal.  Cardiovascular:     Rate and Rhythm: Normal rate and regular rhythm.     Heart sounds: Normal heart sounds. No murmur heard. No friction rub. No gallop.   Pulmonary:     Effort: Pulmonary effort is normal. No respiratory distress.     Breath sounds: Normal breath sounds. No wheezing or rales.  Skin:    General: Skin is warm and dry.     Findings: No erythema or rash.  Neurological:     Mental Status: She is alert.  Psychiatric:        Mood and Affect: Mood normal.        Behavior: Behavior normal.        Thought Content: Thought content normal.        Judgment: Judgment normal.     Lab Review:     Component Value Date/Time   NA 140 10/17/2020 1236   NA 141 08/05/2017 0927   K 4.3 10/17/2020 1236   K 4.0 08/05/2017 0927   CL 103 10/17/2020 1236   CL 101 02/07/2013 1125   CO2 29 10/17/2020 1236   CO2 31 (H) 08/05/2017 0927   GLUCOSE 307 (H) 10/17/2020 1236   GLUCOSE 125 08/05/2017 0927   GLUCOSE 193 (H) 02/07/2013 1125   BUN 12 10/17/2020 1236   BUN 22.7 08/05/2017 0927   CREATININE 0.82 10/17/2020 1236   CREATININE 0.8 08/05/2017 0927   CALCIUM 9.3 10/17/2020 1236   CALCIUM 9.7 08/05/2017 0927   PROT 7.4 10/17/2020 1236   PROT 7.3 08/05/2017 0927   ALBUMIN 3.2 (L) 10/17/2020 1236   ALBUMIN 3.2 (L) 08/05/2017 0927   AST 65 (H) 10/17/2020 1236   AST 19 08/05/2017 0927   ALT 93 (H) 10/17/2020 1236   ALT 28 08/05/2017 0927   ALKPHOS 124 10/17/2020 1236   ALKPHOS 108 08/05/2017 0927   BILITOT 0.3 10/17/2020 1236   BILITOT 0.34 08/05/2017 0927   GFRNONAA >60 10/17/2020 1236   GFRAA >60 05/10/2020 0447   GFRAA >60 04/18/2020 1023  Component Value Date/Time   WBC 7.2 10/17/2020 1236   WBC 8.0 05/10/2020 0447   RBC 5.17 (H) 10/17/2020 1236   HGB 14.4 10/17/2020 1236   HGB  12.8 08/05/2017 0927   HCT 43.9 10/17/2020 1236   HCT 39.8 08/05/2017 0927   PLT 218 10/17/2020 1236   PLT 267 08/05/2017 0927   MCV 84.9 10/17/2020 1236   MCV 86.7 08/05/2017 0927   MCH 27.9 10/17/2020 1236   MCHC 32.8 10/17/2020 1236   RDW 14.6 10/17/2020 1236   RDW 14.3 08/05/2017 0927   LYMPHSABS 2.3 10/17/2020 1236   LYMPHSABS 2.3 08/05/2017 0927   MONOABS 0.6 10/17/2020 1236   MONOABS 0.4 08/05/2017 0927   EOSABS 0.1 10/17/2020 1236   EOSABS 0.1 08/05/2017 0927   BASOSABS 0.0 10/17/2020 1236   BASOSABS 0.0 08/05/2017 0927   -------------------------------  Imaging from last 24 hours (if applicable):  Radiology interpretation: No results found.    OK to treat.  Sandi Mealy, MHS, PA-C Physician Assistant

## 2020-11-07 DIAGNOSIS — M25569 Pain in unspecified knee: Secondary | ICD-10-CM | POA: Diagnosis not present

## 2020-11-07 DIAGNOSIS — C3490 Malignant neoplasm of unspecified part of unspecified bronchus or lung: Secondary | ICD-10-CM | POA: Diagnosis not present

## 2020-11-07 DIAGNOSIS — I5020 Unspecified systolic (congestive) heart failure: Secondary | ICD-10-CM | POA: Diagnosis not present

## 2020-11-07 DIAGNOSIS — R062 Wheezing: Secondary | ICD-10-CM | POA: Diagnosis not present

## 2020-11-07 DIAGNOSIS — U071 COVID-19: Secondary | ICD-10-CM | POA: Diagnosis not present

## 2020-11-08 DIAGNOSIS — I5020 Unspecified systolic (congestive) heart failure: Secondary | ICD-10-CM | POA: Diagnosis not present

## 2020-11-08 DIAGNOSIS — C3490 Malignant neoplasm of unspecified part of unspecified bronchus or lung: Secondary | ICD-10-CM | POA: Diagnosis not present

## 2020-11-08 DIAGNOSIS — U071 COVID-19: Secondary | ICD-10-CM | POA: Diagnosis not present

## 2020-11-08 DIAGNOSIS — R062 Wheezing: Secondary | ICD-10-CM | POA: Diagnosis not present

## 2020-11-08 DIAGNOSIS — M25569 Pain in unspecified knee: Secondary | ICD-10-CM | POA: Diagnosis not present

## 2020-11-14 ENCOUNTER — Other Ambulatory Visit: Payer: Self-pay

## 2020-11-14 ENCOUNTER — Inpatient Hospital Stay: Payer: Medicare Other

## 2020-11-14 ENCOUNTER — Ambulatory Visit (HOSPITAL_COMMUNITY)
Admission: RE | Admit: 2020-11-14 | Discharge: 2020-11-14 | Disposition: A | Discharge status: Home / Self Care | Payer: Medicare Other | Source: Ambulatory Visit | Attending: Oncology | Admitting: Oncology

## 2020-11-14 VITALS — BP 156/67 | HR 65 | Temp 98.1°F | Resp 18 | Wt 323.0 lb

## 2020-11-14 DIAGNOSIS — R0789 Other chest pain: Secondary | ICD-10-CM | POA: Insufficient documentation

## 2020-11-14 DIAGNOSIS — Z170 Estrogen receptor positive status [ER+]: Secondary | ICD-10-CM

## 2020-11-14 DIAGNOSIS — R55 Syncope and collapse: Secondary | ICD-10-CM | POA: Insufficient documentation

## 2020-11-14 DIAGNOSIS — Z01810 Encounter for preprocedural cardiovascular examination: Secondary | ICD-10-CM | POA: Insufficient documentation

## 2020-11-14 DIAGNOSIS — C50212 Malignant neoplasm of upper-inner quadrant of left female breast: Secondary | ICD-10-CM

## 2020-11-14 DIAGNOSIS — J449 Chronic obstructive pulmonary disease, unspecified: Secondary | ICD-10-CM | POA: Insufficient documentation

## 2020-11-14 DIAGNOSIS — G4730 Sleep apnea, unspecified: Secondary | ICD-10-CM | POA: Diagnosis not present

## 2020-11-14 DIAGNOSIS — Z79811 Long term (current) use of aromatase inhibitors: Secondary | ICD-10-CM | POA: Diagnosis not present

## 2020-11-14 DIAGNOSIS — I10 Essential (primary) hypertension: Secondary | ICD-10-CM | POA: Diagnosis not present

## 2020-11-14 DIAGNOSIS — E119 Type 2 diabetes mellitus without complications: Secondary | ICD-10-CM | POA: Diagnosis not present

## 2020-11-14 DIAGNOSIS — Z0189 Encounter for other specified special examinations: Secondary | ICD-10-CM

## 2020-11-14 DIAGNOSIS — Z8249 Family history of ischemic heart disease and other diseases of the circulatory system: Secondary | ICD-10-CM | POA: Diagnosis not present

## 2020-11-14 DIAGNOSIS — Z95828 Presence of other vascular implants and grafts: Secondary | ICD-10-CM

## 2020-11-14 DIAGNOSIS — Z833 Family history of diabetes mellitus: Secondary | ICD-10-CM | POA: Diagnosis not present

## 2020-11-14 DIAGNOSIS — Z5112 Encounter for antineoplastic immunotherapy: Secondary | ICD-10-CM | POA: Diagnosis not present

## 2020-11-14 DIAGNOSIS — Z803 Family history of malignant neoplasm of breast: Secondary | ICD-10-CM | POA: Diagnosis not present

## 2020-11-14 DIAGNOSIS — Z87891 Personal history of nicotine dependence: Secondary | ICD-10-CM | POA: Diagnosis not present

## 2020-11-14 DIAGNOSIS — C7800 Secondary malignant neoplasm of unspecified lung: Secondary | ICD-10-CM | POA: Diagnosis not present

## 2020-11-14 LAB — CBC WITH DIFFERENTIAL (CANCER CENTER ONLY)
Abs Immature Granulocytes: 0.03 10*3/uL (ref 0.00–0.07)
Basophils Absolute: 0 10*3/uL (ref 0.0–0.1)
Basophils Relative: 0 %
Eosinophils Absolute: 0.1 10*3/uL (ref 0.0–0.5)
Eosinophils Relative: 1 %
HCT: 42.3 % (ref 36.0–46.0)
Hemoglobin: 13.6 g/dL (ref 12.0–15.0)
Immature Granulocytes: 0 %
Lymphocytes Relative: 28 %
Lymphs Abs: 1.9 10*3/uL (ref 0.7–4.0)
MCH: 27.8 pg (ref 26.0–34.0)
MCHC: 32.2 g/dL (ref 30.0–36.0)
MCV: 86.5 fL (ref 80.0–100.0)
Monocytes Absolute: 0.5 10*3/uL (ref 0.1–1.0)
Monocytes Relative: 8 %
Neutro Abs: 4.3 10*3/uL (ref 1.7–7.7)
Neutrophils Relative %: 63 %
Platelet Count: 208 10*3/uL (ref 150–400)
RBC: 4.89 MIL/uL (ref 3.87–5.11)
RDW: 14.6 % (ref 11.5–15.5)
WBC Count: 6.9 10*3/uL (ref 4.0–10.5)
nRBC: 0 % (ref 0.0–0.2)

## 2020-11-14 LAB — CMP (CANCER CENTER ONLY)
ALT: 90 U/L — ABNORMAL HIGH (ref 0–44)
AST: 56 U/L — ABNORMAL HIGH (ref 15–41)
Albumin: 3.1 g/dL — ABNORMAL LOW (ref 3.5–5.0)
Alkaline Phosphatase: 119 U/L (ref 38–126)
Anion gap: 11 (ref 5–15)
BUN: 13 mg/dL (ref 8–23)
CO2: 27 mmol/L (ref 22–32)
Calcium: 8.7 mg/dL — ABNORMAL LOW (ref 8.9–10.3)
Chloride: 101 mmol/L (ref 98–111)
Creatinine: 0.76 mg/dL (ref 0.44–1.00)
GFR, Estimated: >60 mL/min (ref >60)
Glucose, Bld: 251 mg/dL — ABNORMAL HIGH (ref 70–99)
Potassium: 4.3 mmol/L (ref 3.5–5.1)
Sodium: 139 mmol/L (ref 135–145)
Total Bilirubin: 0.4 mg/dL (ref 0.3–1.2)
Total Protein: 7 g/dL (ref 6.5–8.1)

## 2020-11-14 LAB — ECHOCARDIOGRAM COMPLETE
Area-P 1/2: 2.5 cm2
S' Lateral: 3.1 cm

## 2020-11-14 MED ORDER — ACETAMINOPHEN 325 MG PO TABS
ORAL_TABLET | ORAL | Status: AC
  Filled 2020-11-14: qty 2

## 2020-11-14 MED ORDER — ACETAMINOPHEN 325 MG PO TABS
650.0000 mg | ORAL_TABLET | Freq: Once | ORAL | Status: AC
  Administered 2020-11-14: 650 mg via ORAL

## 2020-11-14 MED ORDER — DIPHENHYDRAMINE HCL 25 MG PO CAPS
25.0000 mg | ORAL_CAPSULE | Freq: Once | ORAL | Status: AC
  Administered 2020-11-14: 25 mg via ORAL

## 2020-11-14 MED ORDER — SODIUM CHLORIDE 0.9 % IV SOLN
Freq: Once | INTRAVENOUS | Status: AC
  Filled 2020-11-14: qty 250

## 2020-11-14 MED ORDER — DIPHENHYDRAMINE HCL 25 MG PO CAPS
ORAL_CAPSULE | ORAL | Status: AC
  Filled 2020-11-14: qty 1

## 2020-11-14 MED ORDER — HEPARIN SOD (PORK) LOCK FLUSH 100 UNIT/ML IV SOLN
500.0000 [IU] | Freq: Once | INTRAVENOUS | Status: AC | PRN
  Administered 2020-11-14: 500 [IU]
  Filled 2020-11-14: qty 5

## 2020-11-14 MED ORDER — LORAZEPAM 2 MG/ML IJ SOLN
INTRAMUSCULAR | Status: AC
  Filled 2020-11-14: qty 1

## 2020-11-14 MED ORDER — TRASTUZUMAB-ANNS CHEMO 150 MG IV SOLR
750.0000 mg | Freq: Once | INTRAVENOUS | Status: AC
Start: 2020-11-14 — End: 2020-11-14
  Administered 2020-11-14: 750 mg via INTRAVENOUS
  Filled 2020-11-14: qty 35.72

## 2020-11-14 MED ORDER — LORAZEPAM 2 MG/ML IJ SOLN
0.5000 mg | Freq: Once | INTRAMUSCULAR | Status: AC
  Administered 2020-11-14: 0.5 mg via INTRAVENOUS

## 2020-11-14 MED ORDER — SODIUM CHLORIDE 0.9% FLUSH
10.0000 mL | Freq: Once | INTRAVENOUS | Status: AC
  Administered 2020-11-14: 10 mL via INTRAVENOUS
  Filled 2020-11-14: qty 10

## 2020-11-14 MED ORDER — SODIUM CHLORIDE 0.9% FLUSH
10.0000 mL | Freq: Once | INTRAVENOUS | Status: AC
  Administered 2020-11-14: 10 mL
  Filled 2020-11-14: qty 10

## 2020-11-14 NOTE — Progress Notes (Signed)
  Echocardiogram 2D Echocardiogram has been performed.  Darlina Sicilian M 11/14/2020, 2:10 PM

## 2020-11-14 NOTE — Patient Instructions (Signed)
[facility] Discharge Instructions for Patients Receiving Chemotherapy  Today you received the following chemotherapy agents trastuzumab.  To help prevent nausea and vomiting after your treatment, we encourage you to take your nausea medication as directed.    If you develop nausea and vomiting that is not controlled by your nausea medication, call the clinic.   BELOW ARE SYMPTOMS THAT SHOULD BE REPORTED IMMEDIATELY:  *FEVER GREATER THAN 100.5 F  *CHILLS WITH OR WITHOUT FEVER  NAUSEA AND VOMITING THAT IS NOT CONTROLLED WITH YOUR NAUSEA MEDICATION  *UNUSUAL SHORTNESS OF BREATH  *UNUSUAL BRUISING OR BLEEDING  TENDERNESS IN MOUTH AND THROAT WITH OR WITHOUT PRESENCE OF ULCERS  *URINARY PROBLEMS  *BOWEL PROBLEMS  UNUSUAL RASH Items with * indicate a potential emergency and should be followed up as soon as possible.  Feel free to call the clinic should you have any questions or concerns. The clinic phone number is [phone].  Please show the CHEMO ALERT CARD at check-in to the Emergency Department and triage nurse.   

## 2020-11-14 NOTE — Patient Instructions (Signed)
Implanted Port Insertion, Care After This sheet gives you information about how to care for yourself after your procedure. Your health care provider may also give you more specific instructions. If you have problems or questions, contact your health care provider. What can I expect after the procedure? After the procedure, it is common to have:  Discomfort at the port insertion site.  Bruising on the skin over the port. This should improve over 3-4 days. Follow these instructions at home: Port care  After your port is placed, you will get a manufacturer's information card. The card has information about your port. Keep this card with you at all times.  Take care of the port as told by your health care provider. Ask your health care provider if you or a family member can get training for taking care of the port at home. A home health care nurse may also take care of the port.  Make sure to remember what type of port you have. Incision care  Follow instructions from your health care provider about how to take care of your port insertion site. Make sure you: ? Wash your hands with soap and water before and after you change your bandage (dressing). If soap and water are not available, use hand sanitizer. ? Change your dressing as told by your health care provider. ? Leave stitches (sutures), skin glue, or adhesive strips in place. These skin closures may need to stay in place for 2 weeks or longer. If adhesive strip edges start to loosen and curl up, you may trim the loose edges. Do not remove adhesive strips completely unless your health care provider tells you to do that.  Check your port insertion site every day for signs of infection. Check for: ? Redness, swelling, or pain. ? Fluid or blood. ? Warmth. ? Pus or a bad smell.      Activity  Return to your normal activities as told by your health care provider. Ask your health care provider what activities are safe for you.  Do not  lift anything that is heavier than 10 lb (4.5 kg), or the limit that you are told, until your health care provider says that it is safe. General instructions  Take over-the-counter and prescription medicines only as told by your health care provider.  Do not take baths, swim, or use a hot tub until your health care provider approves. Ask your health care provider if you may take showers. You may only be allowed to take sponge baths.  Do not drive for 24 hours if you were given a sedative during your procedure.  Wear a medical alert bracelet in case of an emergency. This will tell any health care providers that you have a port.  Keep all follow-up visits as told by your health care provider. This is important. Contact a health care provider if:  You cannot flush your port with saline as directed, or you cannot draw blood from the port.  You have a fever or chills.  You have redness, swelling, or pain around your port insertion site.  You have fluid or blood coming from your port insertion site.  Your port insertion site feels warm to the touch.  You have pus or a bad smell coming from the port insertion site. Get help right away if:  You have chest pain or shortness of breath.  You have bleeding from your port that you cannot control. Summary  Take care of the port as told by your   health care provider. Keep the manufacturer's information card with you at all times.  Change your dressing as told by your health care provider.  Contact a health care provider if you have a fever or chills or if you have redness, swelling, or pain around your port insertion site.  Keep all follow-up visits as told by your health care provider. This information is not intended to replace advice given to you by your health care provider. Make sure you discuss any questions you have with your health care provider. Document Revised: 03/02/2018 Document Reviewed: 03/02/2018 Elsevier Patient Education   2021 Elsevier Inc.  

## 2020-11-14 NOTE — Progress Notes (Signed)
Per Dr Jana Hakim, Hospers to treat despite not echo since 03/2020. Will try to schedule echo for pt.

## 2020-11-14 NOTE — Progress Notes (Signed)
During echocardiogram patient stated that she has been having intermittent chest pain for a little over a month. Patient reports the pain does not radiate and was able to point to where the pain is located. Dr. Jana Hakim was informed and advised to proceed with treatment.

## 2020-11-26 ENCOUNTER — Other Ambulatory Visit: Payer: Self-pay

## 2020-11-26 ENCOUNTER — Other Ambulatory Visit: Payer: Self-pay | Admitting: Family Medicine

## 2020-11-26 MED ORDER — CIPROFLOXACIN HCL 500 MG PO TABS: 500.0000 mg | ORAL_TABLET | Freq: Two times a day (BID) | ORAL | 0 refills | Status: DC

## 2020-11-26 NOTE — Progress Notes (Signed)
Pt called and states she has a UTI and has been taking a cranberry supplement since 11/22/20 but it is not helping. Pt describes sx as burning  While urinating, urinary frequency. Dr Jana Hakim prescribed cipro 500 mg BID 6 tabs for pt to [facility]. Pt verbalized thanks and understanding.

## 2020-11-28 ENCOUNTER — Other Ambulatory Visit: Payer: Self-pay

## 2020-11-28 MED ORDER — ONETOUCH ULTRA VI STRP: ORAL_STRIP | 0 refills | Status: DC

## 2020-11-28 MED ORDER — INSULIN LISPRO (1 UNIT DIAL) 100 UNIT/ML (KWIKPEN): PEN_INJECTOR | SUBCUTANEOUS | 1 refills | Status: DC

## 2020-12-08 DIAGNOSIS — U071 COVID-19: Secondary | ICD-10-CM | POA: Diagnosis not present

## 2020-12-08 DIAGNOSIS — I5020 Unspecified systolic (congestive) heart failure: Secondary | ICD-10-CM | POA: Diagnosis not present

## 2020-12-08 DIAGNOSIS — M25569 Pain in unspecified knee: Secondary | ICD-10-CM | POA: Diagnosis not present

## 2020-12-08 DIAGNOSIS — C3490 Malignant neoplasm of unspecified part of unspecified bronchus or lung: Secondary | ICD-10-CM | POA: Diagnosis not present

## 2020-12-08 DIAGNOSIS — R062 Wheezing: Secondary | ICD-10-CM | POA: Diagnosis not present

## 2020-12-09 DIAGNOSIS — I5020 Unspecified systolic (congestive) heart failure: Secondary | ICD-10-CM | POA: Diagnosis not present

## 2020-12-09 DIAGNOSIS — M25569 Pain in unspecified knee: Secondary | ICD-10-CM | POA: Diagnosis not present

## 2020-12-09 DIAGNOSIS — U071 COVID-19: Secondary | ICD-10-CM | POA: Diagnosis not present

## 2020-12-09 DIAGNOSIS — C3490 Malignant neoplasm of unspecified part of unspecified bronchus or lung: Secondary | ICD-10-CM | POA: Diagnosis not present

## 2020-12-09 DIAGNOSIS — R062 Wheezing: Secondary | ICD-10-CM | POA: Diagnosis not present

## 2020-12-12 ENCOUNTER — Other Ambulatory Visit: Payer: Self-pay

## 2020-12-12 ENCOUNTER — Inpatient Hospital Stay: Payer: Medicare Other

## 2020-12-12 ENCOUNTER — Inpatient Hospital Stay: Payer: Medicare Other | Attending: Oncology

## 2020-12-12 VITALS — BP 159/74 | HR 59 | Temp 98.8°F | Resp 18 | Ht 64.0 in | Wt 322.0 lb

## 2020-12-12 DIAGNOSIS — Z170 Estrogen receptor positive status [ER+]: Secondary | ICD-10-CM | POA: Insufficient documentation

## 2020-12-12 DIAGNOSIS — I10 Essential (primary) hypertension: Secondary | ICD-10-CM | POA: Diagnosis not present

## 2020-12-12 DIAGNOSIS — C50212 Malignant neoplasm of upper-inner quadrant of left female breast: Secondary | ICD-10-CM | POA: Diagnosis not present

## 2020-12-12 DIAGNOSIS — C7800 Secondary malignant neoplasm of unspecified lung: Secondary | ICD-10-CM | POA: Insufficient documentation

## 2020-12-12 DIAGNOSIS — E119 Type 2 diabetes mellitus without complications: Secondary | ICD-10-CM | POA: Insufficient documentation

## 2020-12-12 DIAGNOSIS — Z87891 Personal history of nicotine dependence: Secondary | ICD-10-CM | POA: Diagnosis not present

## 2020-12-12 DIAGNOSIS — Z95828 Presence of other vascular implants and grafts: Secondary | ICD-10-CM

## 2020-12-12 DIAGNOSIS — Z5112 Encounter for antineoplastic immunotherapy: Secondary | ICD-10-CM | POA: Insufficient documentation

## 2020-12-12 DIAGNOSIS — Z79899 Other long term (current) drug therapy: Secondary | ICD-10-CM | POA: Insufficient documentation

## 2020-12-12 LAB — CMP (CANCER CENTER ONLY)
ALT: 92 U/L — ABNORMAL HIGH (ref 0–44)
AST: 68 U/L — ABNORMAL HIGH (ref 15–41)
Albumin: 3.2 g/dL — ABNORMAL LOW (ref 3.5–5.0)
Alkaline Phosphatase: 116 U/L (ref 38–126)
Anion gap: 12 (ref 5–15)
BUN: 16 mg/dL (ref 8–23)
CO2: 28 mmol/L (ref 22–32)
Calcium: 9 mg/dL (ref 8.9–10.3)
Chloride: 101 mmol/L (ref 98–111)
Creatinine: 0.74 mg/dL (ref 0.44–1.00)
GFR, Estimated: >60 mL/min (ref >60)
Glucose, Bld: 254 mg/dL — ABNORMAL HIGH (ref 70–99)
Potassium: 4.3 mmol/L (ref 3.5–5.1)
Sodium: 141 mmol/L (ref 135–145)
Total Bilirubin: 0.5 mg/dL (ref 0.3–1.2)
Total Protein: 7.3 g/dL (ref 6.5–8.1)

## 2020-12-12 LAB — CBC WITH DIFFERENTIAL (CANCER CENTER ONLY)
Abs Immature Granulocytes: 0.01 10*3/uL (ref 0.00–0.07)
Basophils Absolute: 0 10*3/uL (ref 0.0–0.1)
Basophils Relative: 1 %
Eosinophils Absolute: 0.1 10*3/uL (ref 0.0–0.5)
Eosinophils Relative: 1 %
HCT: 41.8 % (ref 36.0–46.0)
Hemoglobin: 13.9 g/dL (ref 12.0–15.0)
Immature Granulocytes: 0 %
Lymphocytes Relative: 30 %
Lymphs Abs: 2.2 10*3/uL (ref 0.7–4.0)
MCH: 28.6 pg (ref 26.0–34.0)
MCHC: 33.3 g/dL (ref 30.0–36.0)
MCV: 86 fL (ref 80.0–100.0)
Monocytes Absolute: 0.6 10*3/uL (ref 0.1–1.0)
Monocytes Relative: 8 %
Neutro Abs: 4.4 10*3/uL (ref 1.7–7.7)
Neutrophils Relative %: 60 %
Platelet Count: 211 10*3/uL (ref 150–400)
RBC: 4.86 MIL/uL (ref 3.87–5.11)
RDW: 15.1 % (ref 11.5–15.5)
WBC Count: 7.3 10*3/uL (ref 4.0–10.5)
nRBC: 0 % (ref 0.0–0.2)

## 2020-12-12 MED ORDER — ACETAMINOPHEN 325 MG PO TABS
ORAL_TABLET | ORAL | Status: AC
  Filled 2020-12-12: qty 2

## 2020-12-12 MED ORDER — TRASTUZUMAB-ANNS CHEMO 150 MG IV SOLR
750.0000 mg | Freq: Once | INTRAVENOUS | Status: AC
  Administered 2020-12-12: 750 mg via INTRAVENOUS
  Filled 2020-12-12: qty 35.72

## 2020-12-12 MED ORDER — LORAZEPAM 2 MG/ML IJ SOLN
INTRAMUSCULAR | Status: AC
  Filled 2020-12-12: qty 1

## 2020-12-12 MED ORDER — ACETAMINOPHEN 325 MG PO TABS
650.0000 mg | ORAL_TABLET | Freq: Once | ORAL | Status: AC
  Administered 2020-12-12: 650 mg via ORAL

## 2020-12-12 MED ORDER — HEPARIN SOD (PORK) LOCK FLUSH 100 UNIT/ML IV SOLN
500.0000 [IU] | Freq: Once | INTRAVENOUS | Status: AC | PRN
  Administered 2020-12-12: 500 [IU]
  Filled 2020-12-12: qty 5

## 2020-12-12 MED ORDER — SODIUM CHLORIDE 0.9% FLUSH
10.0000 mL | Freq: Once | INTRAVENOUS | Status: AC
  Administered 2020-12-12: 10 mL via INTRAVENOUS
  Filled 2020-12-12: qty 10

## 2020-12-12 MED ORDER — DIPHENHYDRAMINE HCL 25 MG PO CAPS
ORAL_CAPSULE | ORAL | Status: AC
  Filled 2020-12-12: qty 1

## 2020-12-12 MED ORDER — DIPHENHYDRAMINE HCL 25 MG PO CAPS
25.0000 mg | ORAL_CAPSULE | Freq: Once | ORAL | Status: AC
  Administered 2020-12-12: 25 mg via ORAL

## 2020-12-12 MED ORDER — SODIUM CHLORIDE 0.9 % IV SOLN
Freq: Once | INTRAVENOUS | Status: AC
  Filled 2020-12-12: qty 250

## 2020-12-12 MED ORDER — LORAZEPAM 2 MG/ML IJ SOLN
0.5000 mg | Freq: Once | INTRAMUSCULAR | Status: AC
  Administered 2020-12-12: 0.5 mg via INTRAVENOUS

## 2020-12-12 MED ORDER — SODIUM CHLORIDE 0.9% FLUSH
10.0000 mL | Freq: Once | INTRAVENOUS | Status: AC
Start: 2020-12-12 — End: 2020-12-12
  Administered 2020-12-12: 10 mL
  Filled 2020-12-12: qty 10

## 2020-12-12 NOTE — Patient Instructions (Signed)
[facility]   Discharge Instructions: Thank you for choosing [facility] to provide your oncology and hematology care.   If you have a lab appointment with the [facility], please go directly to the [facility] and check in at the registration area.   Wear comfortable clothing and clothing appropriate for easy access to any Portacath or PICC line.   We strive to give you quality time with your provider. You may need to reschedule your appointment if you arrive late (15 or more minutes).  Arriving late affects you and other patients whose appointments are after yours.  Also, if you miss three or more appointments without notifying the office, you may be dismissed from the clinic at the provider's discretion.      For prescription refill requests, have your pharmacy contact our office and allow 72 hours for refills to be completed.    Today you received the following chemotherapy and/or immunotherapy agents: Trastuzumab (kanjinti)   To help prevent nausea and vomiting after your treatment, we encourage you to take your nausea medication as directed.  BELOW ARE SYMPTOMS THAT SHOULD BE REPORTED IMMEDIATELY: . *FEVER GREATER THAN 100.4 F (38 C) OR HIGHER . *CHILLS OR SWEATING . *NAUSEA AND VOMITING THAT IS NOT CONTROLLED WITH YOUR NAUSEA MEDICATION . *UNUSUAL SHORTNESS OF BREATH . *UNUSUAL BRUISING OR BLEEDING . *URINARY PROBLEMS (pain or burning when urinating, or frequent urination) . *BOWEL PROBLEMS (unusual diarrhea, constipation, pain near the anus) . TENDERNESS IN MOUTH AND THROAT WITH OR WITHOUT PRESENCE OF ULCERS (sore throat, sores in mouth, or a toothache) . UNUSUAL RASH, SWELLING OR PAIN  . UNUSUAL VAGINAL DISCHARGE OR ITCHING   Items with * indicate a potential emergency and should be followed up as soon as possible or go to the Emergency Department if any problems should occur.  Please show the CHEMOTHERAPY ALERT CARD or  IMMUNOTHERAPY ALERT CARD at check-in to the Emergency Department and triage nurse.  Should you have questions after your visit or need to cancel or reschedule your appointment, please contact [facility]  Dept: [phone]  and follow the prompts.  Office hours are 8:00 a.m. to 4:30 p.m. Monday - Friday. Please note that voicemails left after 4:00 p.m. may not be returned until the following business day.  We are closed weekends and major holidays. You have access to a nurse at all times for urgent questions. Please call the main number to the clinic Dept: [phone] and follow the prompts.   For any non-urgent questions, you may also contact your provider using MyChart. We now offer e-Visits for anyone 53 and older to request care online for non-urgent symptoms. For details visit [email or web address].   Also download the MyChart app! Go to the app store, search "MyChart", open the app, select [facility], and log in with your MyChart username and password.  Due to Covid, a mask is required upon entering the hospital/clinic. If you do not have a mask, one will be given to you upon arrival. For doctor visits, patients may have 1 support person aged 22 or older with them. For treatment visits, patients cannot have anyone with them due to current Covid guidelines and our immunocompromised population.

## 2021-01-07 DIAGNOSIS — I5020 Unspecified systolic (congestive) heart failure: Secondary | ICD-10-CM | POA: Diagnosis not present

## 2021-01-07 DIAGNOSIS — R062 Wheezing: Secondary | ICD-10-CM | POA: Diagnosis not present

## 2021-01-07 DIAGNOSIS — U071 COVID-19: Secondary | ICD-10-CM | POA: Diagnosis not present

## 2021-01-07 DIAGNOSIS — C3490 Malignant neoplasm of unspecified part of unspecified bronchus or lung: Secondary | ICD-10-CM | POA: Diagnosis not present

## 2021-01-07 DIAGNOSIS — M25569 Pain in unspecified knee: Secondary | ICD-10-CM | POA: Diagnosis not present

## 2021-01-08 DIAGNOSIS — R062 Wheezing: Secondary | ICD-10-CM | POA: Diagnosis not present

## 2021-01-08 DIAGNOSIS — I5020 Unspecified systolic (congestive) heart failure: Secondary | ICD-10-CM | POA: Diagnosis not present

## 2021-01-08 DIAGNOSIS — C3490 Malignant neoplasm of unspecified part of unspecified bronchus or lung: Secondary | ICD-10-CM | POA: Diagnosis not present

## 2021-01-08 DIAGNOSIS — U071 COVID-19: Secondary | ICD-10-CM | POA: Diagnosis not present

## 2021-01-08 DIAGNOSIS — M25569 Pain in unspecified knee: Secondary | ICD-10-CM | POA: Diagnosis not present

## 2021-01-09 ENCOUNTER — Other Ambulatory Visit: Payer: Self-pay

## 2021-01-09 ENCOUNTER — Inpatient Hospital Stay: Payer: Medicare Other | Attending: Oncology

## 2021-01-09 ENCOUNTER — Inpatient Hospital Stay: Payer: Medicare Other

## 2021-01-09 ENCOUNTER — Other Ambulatory Visit: Payer: Medicare Other

## 2021-01-09 VITALS — BP 113/97 | HR 71 | Temp 98.6°F | Resp 18

## 2021-01-09 DIAGNOSIS — C50212 Malignant neoplasm of upper-inner quadrant of left female breast: Secondary | ICD-10-CM

## 2021-01-09 DIAGNOSIS — Z5112 Encounter for antineoplastic immunotherapy: Secondary | ICD-10-CM | POA: Insufficient documentation

## 2021-01-09 DIAGNOSIS — C7800 Secondary malignant neoplasm of unspecified lung: Secondary | ICD-10-CM | POA: Insufficient documentation

## 2021-01-09 DIAGNOSIS — Z87891 Personal history of nicotine dependence: Secondary | ICD-10-CM | POA: Diagnosis not present

## 2021-01-09 DIAGNOSIS — Z79899 Other long term (current) drug therapy: Secondary | ICD-10-CM | POA: Insufficient documentation

## 2021-01-09 DIAGNOSIS — I10 Essential (primary) hypertension: Secondary | ICD-10-CM | POA: Diagnosis not present

## 2021-01-09 DIAGNOSIS — Z170 Estrogen receptor positive status [ER+]: Secondary | ICD-10-CM | POA: Insufficient documentation

## 2021-01-09 DIAGNOSIS — Z95828 Presence of other vascular implants and grafts: Secondary | ICD-10-CM

## 2021-01-09 DIAGNOSIS — Z79811 Long term (current) use of aromatase inhibitors: Secondary | ICD-10-CM | POA: Insufficient documentation

## 2021-01-09 LAB — CBC WITH DIFFERENTIAL (CANCER CENTER ONLY)
Abs Immature Granulocytes: 0.02 10*3/uL (ref 0.00–0.07)
Basophils Absolute: 0 10*3/uL (ref 0.0–0.1)
Basophils Relative: 0 %
Eosinophils Absolute: 0.1 10*3/uL (ref 0.0–0.5)
Eosinophils Relative: 1 %
HCT: 42.3 % (ref 36.0–46.0)
Hemoglobin: 14.3 g/dL (ref 12.0–15.0)
Immature Granulocytes: 0 %
Lymphocytes Relative: 25 %
Lymphs Abs: 1.7 10*3/uL (ref 0.7–4.0)
MCH: 29.1 pg (ref 26.0–34.0)
MCHC: 33.8 g/dL (ref 30.0–36.0)
MCV: 86 fL (ref 80.0–100.0)
Monocytes Absolute: 0.4 10*3/uL (ref 0.1–1.0)
Monocytes Relative: 6 %
Neutro Abs: 4.6 10*3/uL (ref 1.7–7.7)
Neutrophils Relative %: 68 %
Platelet Count: 216 10*3/uL (ref 150–400)
RBC: 4.92 MIL/uL (ref 3.87–5.11)
RDW: 14.6 % (ref 11.5–15.5)
WBC Count: 6.8 10*3/uL (ref 4.0–10.5)
nRBC: 0 % (ref 0.0–0.2)

## 2021-01-09 LAB — CMP (CANCER CENTER ONLY)
ALT: 123 U/L — ABNORMAL HIGH (ref 0–44)
AST: 81 U/L — ABNORMAL HIGH (ref 15–41)
Albumin: 3.1 g/dL — ABNORMAL LOW (ref 3.5–5.0)
Alkaline Phosphatase: 123 U/L (ref 38–126)
Anion gap: 9 (ref 5–15)
BUN: 17 mg/dL (ref 8–23)
CO2: 29 mmol/L (ref 22–32)
Calcium: 9.5 mg/dL (ref 8.9–10.3)
Chloride: 101 mmol/L (ref 98–111)
Creatinine: 0.83 mg/dL (ref 0.44–1.00)
GFR, Estimated: >60 mL/min (ref >60)
Glucose, Bld: 336 mg/dL — ABNORMAL HIGH (ref 70–99)
Potassium: 4.3 mmol/L (ref 3.5–5.1)
Sodium: 139 mmol/L (ref 135–145)
Total Bilirubin: 0.3 mg/dL (ref 0.3–1.2)
Total Protein: 7.3 g/dL (ref 6.5–8.1)

## 2021-01-09 MED ORDER — ACETAMINOPHEN 325 MG PO TABS
650.0000 mg | ORAL_TABLET | Freq: Once | ORAL | Status: AC
  Administered 2021-01-09: 650 mg via ORAL

## 2021-01-09 MED ORDER — SODIUM CHLORIDE 0.9 % IV SOLN
Freq: Once | INTRAVENOUS | Status: AC
  Filled 2021-01-09: qty 250

## 2021-01-09 MED ORDER — LORAZEPAM 2 MG/ML IJ SOLN
0.5000 mg | Freq: Once | INTRAMUSCULAR | Status: AC
  Administered 2021-01-09: 0.5 mg via INTRAVENOUS

## 2021-01-09 MED ORDER — DIPHENHYDRAMINE HCL 25 MG PO CAPS
ORAL_CAPSULE | ORAL | Status: AC
  Filled 2021-01-09: qty 1

## 2021-01-09 MED ORDER — HEPARIN SOD (PORK) LOCK FLUSH 100 UNIT/ML IV SOLN
500.0000 [IU] | Freq: Once | INTRAVENOUS | Status: AC | PRN
Start: 2021-01-09 — End: 2021-01-09
  Administered 2021-01-09: 500 [IU]
  Filled 2021-01-09: qty 5

## 2021-01-09 MED ORDER — TRASTUZUMAB-ANNS CHEMO 150 MG IV SOLR
750.0000 mg | Freq: Once | INTRAVENOUS | Status: AC
  Administered 2021-01-09: 750 mg via INTRAVENOUS
  Filled 2021-01-09: qty 35.72

## 2021-01-09 MED ORDER — LORAZEPAM 2 MG/ML IJ SOLN
INTRAMUSCULAR | Status: AC
  Filled 2021-01-09: qty 1

## 2021-01-09 MED ORDER — ACETAMINOPHEN 325 MG PO TABS
ORAL_TABLET | ORAL | Status: AC
  Filled 2021-01-09: qty 2

## 2021-01-09 MED ORDER — DIPHENHYDRAMINE HCL 25 MG PO CAPS
25.0000 mg | ORAL_CAPSULE | Freq: Once | ORAL | Status: AC
  Administered 2021-01-09: 25 mg via ORAL

## 2021-01-09 MED ORDER — SODIUM CHLORIDE 0.9% FLUSH
10.0000 mL | Freq: Once | INTRAVENOUS | Status: AC
  Administered 2021-01-09: 10 mL
  Filled 2021-01-09: qty 10

## 2021-01-09 MED ORDER — SODIUM CHLORIDE 0.9% FLUSH
10.0000 mL | Freq: Once | INTRAVENOUS | Status: AC
  Administered 2021-01-09: 10 mL via INTRAVENOUS
  Filled 2021-01-09: qty 10

## 2021-01-09 NOTE — Patient Instructions (Signed)
[facility]  Discharge Instructions: Thank you for choosing [facility] to provide your oncology and hematology care.   If you have a lab appointment with the [facility], please go directly to the [facility] and check in at the registration area.   Wear comfortable clothing and clothing appropriate for easy access to any Portacath or PICC line.   We strive to give you quality time with your provider. You may need to reschedule your appointment if you arrive late (15 or more minutes).  Arriving late affects you and other patients whose appointments are after yours.  Also, if you miss three or more appointments without notifying the office, you may be dismissed from the clinic at the provider's discretion.      For prescription refill requests, have your pharmacy contact our office and allow 72 hours for refills to be completed.    Today you received the following chemotherapy and/or immunotherapy agents Transtuzumab.      To help prevent nausea and vomiting after your treatment, we encourage you to take your nausea medication as directed.  BELOW ARE SYMPTOMS THAT SHOULD BE REPORTED IMMEDIATELY: . *FEVER GREATER THAN 100.4 F (38 C) OR HIGHER . *CHILLS OR SWEATING . *NAUSEA AND VOMITING THAT IS NOT CONTROLLED WITH YOUR NAUSEA MEDICATION . *UNUSUAL SHORTNESS OF BREATH . *UNUSUAL BRUISING OR BLEEDING . *URINARY PROBLEMS (pain or burning when urinating, or frequent urination) . *BOWEL PROBLEMS (unusual diarrhea, constipation, pain near the anus) . TENDERNESS IN MOUTH AND THROAT WITH OR WITHOUT PRESENCE OF ULCERS (sore throat, sores in mouth, or a toothache) . UNUSUAL RASH, SWELLING OR PAIN  . UNUSUAL VAGINAL DISCHARGE OR ITCHING   Items with * indicate a potential emergency and should be followed up as soon as possible or go to the Emergency Department if any problems should occur.  Please show the CHEMOTHERAPY ALERT CARD or IMMUNOTHERAPY  ALERT CARD at check-in to the Emergency Department and triage nurse.  Should you have questions after your visit or need to cancel or reschedule your appointment, please contact [facility]  Dept: [phone]  and follow the prompts.  Office hours are 8:00 a.m. to 4:30 p.m. Monday - Friday. Please note that voicemails left after 4:00 p.m. may not be returned until the following business day.  We are closed weekends and major holidays. You have access to a nurse at all times for urgent questions. Please call the main number to the clinic Dept: [phone] and follow the prompts.   For any non-urgent questions, you may also contact your provider using MyChart. We now offer e-Visits for anyone 53 and older to request care online for non-urgent symptoms. For details visit [email or web address].   Also download the MyChart app! Go to the app store, search "MyChart", open the app, select [facility], and log in with your MyChart username and password.  Due to Covid, a mask is required upon entering the hospital/clinic. If you do not have a mask, one will be given to you upon arrival. For doctor visits, patients may have 1 support person aged 90 or older with them. For treatment visits, patients cannot have anyone with them due to current Covid guidelines and our immunocompromised population.

## 2021-01-16 ENCOUNTER — Telehealth: Payer: Self-pay | Admitting: *Deleted

## 2021-01-16 DIAGNOSIS — B379 Candidiasis, unspecified: Secondary | ICD-10-CM

## 2021-01-16 MED ORDER — FLUCONAZOLE 100 MG PO TABS: 100.0000 mg | ORAL_TABLET | Freq: Every day | ORAL | 0 refills | Status: DC

## 2021-01-16 MED ORDER — KETOCONAZOLE 2 % EX CREA
1.0000 | TOPICAL_CREAM | Freq: Every day | CUTANEOUS | 0 refills | Status: DC
Start: 2021-01-16 — End: 2021-07-29

## 2021-01-16 NOTE — Telephone Encounter (Signed)
This RN spoke with Terrence Dupont per her call stating ongoing genital yeast not resolved with OTC creams and sitz baths.  This RN reviewed with pt noted elevations in her glucose ( known diabetic ) which may be allowing ongoing yeast.  Discussed benefit not only with prescription medication but also diet, and hygiene- including keeping genital area cleansed with rinsing with diluted soap water ( like dial - 1 pump per bottle of water ) and then rinsing with water that has 2 drops of tea tree oil.  She needs to change any urinary pads often to decrease high sugar in her urine that can cause irritation.  Jodelle verbalized understanding.  Pharmacy verified and medication sent per MD.

## 2021-01-22 DIAGNOSIS — Z5309 Procedure and treatment not carried out because of other contraindication: Secondary | ICD-10-CM

## 2021-01-22 NOTE — Progress Notes (Signed)
[facility])                                            [place] Team                                        Statin Quality Measure Assessment    01/22/2021  JERA HEADINGS 1951-01-30 [id number]  Per review of chart and payor information, this patient has been flagged for non-adherence to the following CMS Quality Measure:   [x]  Statin Use in Persons with Diabetes  - Patient was last seen at her PCP office for diabetes management. A1c was above goal (9.6% on 05/04/2020). The patient was started on Ozempic but has not been seen for follow-up that was supposed to be around December 2021.    []  Statin Use in Persons with Cardiovascular Disease  Currently prescribed statin:  []  Yes [x]  No     Comments: N/A  History of statin use:            []  Yes [x]  No   Comments: N/A  Patient is not currently on a statin and has a h/o persistently elevated LFTs likely in the setting of oncology treatment. 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    Thank you for your time,  Kristeen Miss, [facility] Cell: [phone]

## 2021-01-25 ENCOUNTER — Other Ambulatory Visit: Payer: Self-pay | Admitting: Family Medicine

## 2021-02-06 ENCOUNTER — Other Ambulatory Visit: Payer: Self-pay | Admitting: *Deleted

## 2021-02-06 ENCOUNTER — Other Ambulatory Visit: Payer: Medicare Other

## 2021-02-06 ENCOUNTER — Other Ambulatory Visit: Payer: Self-pay

## 2021-02-06 ENCOUNTER — Inpatient Hospital Stay: Payer: Medicare Other | Attending: Oncology

## 2021-02-06 ENCOUNTER — Inpatient Hospital Stay: Payer: Medicare Other

## 2021-02-06 ENCOUNTER — Telehealth: Payer: Self-pay

## 2021-02-06 VITALS — BP 151/66 | HR 65 | Temp 98.1°F | Resp 20

## 2021-02-06 DIAGNOSIS — Z95828 Presence of other vascular implants and grafts: Secondary | ICD-10-CM

## 2021-02-06 DIAGNOSIS — Z5112 Encounter for antineoplastic immunotherapy: Secondary | ICD-10-CM | POA: Insufficient documentation

## 2021-02-06 DIAGNOSIS — C50212 Malignant neoplasm of upper-inner quadrant of left female breast: Secondary | ICD-10-CM

## 2021-02-06 DIAGNOSIS — Z87891 Personal history of nicotine dependence: Secondary | ICD-10-CM | POA: Insufficient documentation

## 2021-02-06 DIAGNOSIS — C7800 Secondary malignant neoplasm of unspecified lung: Secondary | ICD-10-CM | POA: Diagnosis not present

## 2021-02-06 DIAGNOSIS — Z170 Estrogen receptor positive status [ER+]: Secondary | ICD-10-CM

## 2021-02-06 DIAGNOSIS — I10 Essential (primary) hypertension: Secondary | ICD-10-CM | POA: Diagnosis not present

## 2021-02-06 LAB — CBC WITH DIFFERENTIAL (CANCER CENTER ONLY)
Abs Immature Granulocytes: 0.04 10*3/uL (ref 0.00–0.07)
Basophils Absolute: 0 10*3/uL (ref 0.0–0.1)
Basophils Relative: 0 %
Eosinophils Absolute: 0.1 10*3/uL (ref 0.0–0.5)
Eosinophils Relative: 1 %
HCT: 40.9 % (ref 36.0–46.0)
Hemoglobin: 13.6 g/dL (ref 12.0–15.0)
Immature Granulocytes: 1 %
Lymphocytes Relative: 31 %
Lymphs Abs: 2 10*3/uL (ref 0.7–4.0)
MCH: 28.5 pg (ref 26.0–34.0)
MCHC: 33.3 g/dL (ref 30.0–36.0)
MCV: 85.6 fL (ref 80.0–100.0)
Monocytes Absolute: 0.5 10*3/uL (ref 0.1–1.0)
Monocytes Relative: 8 %
Neutro Abs: 3.7 10*3/uL (ref 1.7–7.7)
Neutrophils Relative %: 59 %
Platelet Count: 209 10*3/uL (ref 150–400)
RBC: 4.78 MIL/uL (ref 3.87–5.11)
RDW: 14.1 % (ref 11.5–15.5)
WBC Count: 6.3 10*3/uL (ref 4.0–10.5)
nRBC: 0 % (ref 0.0–0.2)

## 2021-02-06 LAB — CMP (CANCER CENTER ONLY)
ALT: 88 U/L — ABNORMAL HIGH (ref 0–44)
AST: 51 U/L — ABNORMAL HIGH (ref 15–41)
Albumin: 3 g/dL — ABNORMAL LOW (ref 3.5–5.0)
Alkaline Phosphatase: 112 U/L (ref 38–126)
Anion gap: 10 (ref 5–15)
BUN: 13 mg/dL (ref 8–23)
CO2: 29 mmol/L (ref 22–32)
Calcium: 9.2 mg/dL (ref 8.9–10.3)
Chloride: 101 mmol/L (ref 98–111)
Creatinine: 0.72 mg/dL (ref 0.44–1.00)
GFR, Estimated: >60 mL/min (ref >60)
Glucose, Bld: 254 mg/dL — ABNORMAL HIGH (ref 70–99)
Potassium: 4.4 mmol/L (ref 3.5–5.1)
Sodium: 140 mmol/L (ref 135–145)
Total Bilirubin: 0.4 mg/dL (ref 0.3–1.2)
Total Protein: 6.9 g/dL (ref 6.5–8.1)

## 2021-02-06 MED ORDER — LOPERAMIDE HCL 2 MG PO CAPS: ORAL_CAPSULE | ORAL | 1 refills | Status: DC

## 2021-02-06 MED ORDER — DIPHENHYDRAMINE HCL 25 MG PO CAPS
ORAL_CAPSULE | ORAL | Status: AC
  Filled 2021-02-06: qty 1

## 2021-02-06 MED ORDER — ACETAMINOPHEN 325 MG PO TABS
650.0000 mg | ORAL_TABLET | Freq: Once | ORAL | Status: AC
  Administered 2021-02-06: 650 mg via ORAL

## 2021-02-06 MED ORDER — ACETAMINOPHEN 325 MG PO TABS
ORAL_TABLET | ORAL | Status: AC
  Filled 2021-02-06: qty 2

## 2021-02-06 MED ORDER — SODIUM CHLORIDE 0.9 % IV SOLN
Freq: Once | INTRAVENOUS | Status: AC
  Filled 2021-02-06: qty 250

## 2021-02-06 MED ORDER — LORAZEPAM 2 MG/ML IJ SOLN
0.5000 mg | Freq: Once | INTRAMUSCULAR | Status: AC
  Administered 2021-02-06: 0.5 mg via INTRAVENOUS

## 2021-02-06 MED ORDER — TRASTUZUMAB-ANNS CHEMO 150 MG IV SOLR
750.0000 mg | Freq: Once | INTRAVENOUS | Status: AC
  Administered 2021-02-06: 750 mg via INTRAVENOUS
  Filled 2021-02-06: qty 35.72

## 2021-02-06 MED ORDER — LORAZEPAM 2 MG/ML IJ SOLN
INTRAMUSCULAR | Status: AC
  Filled 2021-02-06: qty 1

## 2021-02-06 MED ORDER — SODIUM CHLORIDE 0.9% FLUSH
10.0000 mL | Freq: Once | INTRAVENOUS | Status: AC
  Administered 2021-02-06: 10 mL via INTRAVENOUS
  Filled 2021-02-06: qty 10

## 2021-02-06 MED ORDER — HEPARIN SOD (PORK) LOCK FLUSH 100 UNIT/ML IV SOLN
500.0000 [IU] | Freq: Once | INTRAVENOUS | Status: AC | PRN
  Administered 2021-02-06: 500 [IU]
  Filled 2021-02-06: qty 5

## 2021-02-06 MED ORDER — DIPHENHYDRAMINE HCL 25 MG PO CAPS
25.0000 mg | ORAL_CAPSULE | Freq: Once | ORAL | Status: AC
  Administered 2021-02-06: 25 mg via ORAL

## 2021-02-06 MED ORDER — SODIUM CHLORIDE 0.9% FLUSH
10.0000 mL | Freq: Once | INTRAVENOUS | Status: AC
  Administered 2021-02-06: 10 mL
  Filled 2021-02-06: qty 10

## 2021-02-06 NOTE — Telephone Encounter (Signed)
Received phone call from [facility] representative with alert of recommendation that patient be on a moderate to high intensity statin due to Type II Diabetes dx.   Please advise if initiation of statin therapy is appropriate.   Talbot Grumbling, RN

## 2021-02-06 NOTE — Patient Instructions (Signed)
[facility]  Discharge Instructions: Thank you for choosing [facility] to provide your oncology and hematology care.   If you have a lab appointment with the [facility], please go directly to the [facility] and check in at the registration area.   Wear comfortable clothing and clothing appropriate for easy access to any Portacath or PICC line.   We strive to give you quality time with your provider. You may need to reschedule your appointment if you arrive late (15 or more minutes).  Arriving late affects you and other patients whose appointments are after yours.  Also, if you miss three or more appointments without notifying the office, you may be dismissed from the clinic at the provider's discretion.      For prescription refill requests, have your pharmacy contact our office and allow 72 hours for refills to be completed.    Today you received the following chemotherapy and/or immunotherapy agents Transtuzumab.      To help prevent nausea and vomiting after your treatment, we encourage you to take your nausea medication as directed.  BELOW ARE SYMPTOMS THAT SHOULD BE REPORTED IMMEDIATELY: *FEVER GREATER THAN 100.4 F (38 C) OR HIGHER *CHILLS OR SWEATING *NAUSEA AND VOMITING THAT IS NOT CONTROLLED WITH YOUR NAUSEA MEDICATION *UNUSUAL SHORTNESS OF BREATH *UNUSUAL BRUISING OR BLEEDING *URINARY PROBLEMS (pain or burning when urinating, or frequent urination) *BOWEL PROBLEMS (unusual diarrhea, constipation, pain near the anus) TENDERNESS IN MOUTH AND THROAT WITH OR WITHOUT PRESENCE OF ULCERS (sore throat, sores in mouth, or a toothache) UNUSUAL RASH, SWELLING OR PAIN  UNUSUAL VAGINAL DISCHARGE OR ITCHING   Items with * indicate a potential emergency and should be followed up as soon as possible or go to the Emergency Department if any problems should occur.  Please show the CHEMOTHERAPY ALERT CARD or IMMUNOTHERAPY ALERT CARD at check-in  to the Emergency Department and triage nurse.  Should you have questions after your visit or need to cancel or reschedule your appointment, please contact [facility]  Dept: [phone]  and follow the prompts.  Office hours are 8:00 a.m. to 4:30 p.m. Monday - Friday. Please note that voicemails left after 4:00 p.m. may not be returned until the following business day.  We are closed weekends and major holidays. You have access to a nurse at all times for urgent questions. Please call the main number to the clinic Dept: [phone] and follow the prompts.   For any non-urgent questions, you may also contact your provider using MyChart. We now offer e-Visits for anyone 24 and older to request care online for non-urgent symptoms. For details visit [email or web address].   Also download the MyChart app! Go to the app store, search "MyChart", open the app, select [facility], and log in with your MyChart username and password.  Due to Covid, a mask is required upon entering the hospital/clinic. If you do not have a mask, one will be given to you upon arrival. For doctor visits, patients may have 1 support person aged 12 or older with them. For treatment visits, patients cannot have anyone with them due to current Covid guidelines and our immunocompromised population.

## 2021-02-08 DIAGNOSIS — U071 COVID-19: Secondary | ICD-10-CM | POA: Diagnosis not present

## 2021-02-08 DIAGNOSIS — I5020 Unspecified systolic (congestive) heart failure: Secondary | ICD-10-CM | POA: Diagnosis not present

## 2021-02-08 DIAGNOSIS — M25569 Pain in unspecified knee: Secondary | ICD-10-CM | POA: Diagnosis not present

## 2021-02-08 DIAGNOSIS — R062 Wheezing: Secondary | ICD-10-CM | POA: Diagnosis not present

## 2021-02-08 DIAGNOSIS — C3490 Malignant neoplasm of unspecified part of unspecified bronchus or lung: Secondary | ICD-10-CM | POA: Diagnosis not present

## 2021-02-15 DIAGNOSIS — R062 Wheezing: Secondary | ICD-10-CM | POA: Diagnosis not present

## 2021-02-15 DIAGNOSIS — C3490 Malignant neoplasm of unspecified part of unspecified bronchus or lung: Secondary | ICD-10-CM | POA: Diagnosis not present

## 2021-02-15 DIAGNOSIS — U071 COVID-19: Secondary | ICD-10-CM | POA: Diagnosis not present

## 2021-02-15 DIAGNOSIS — M25569 Pain in unspecified knee: Secondary | ICD-10-CM | POA: Diagnosis not present

## 2021-02-15 DIAGNOSIS — I5020 Unspecified systolic (congestive) heart failure: Secondary | ICD-10-CM | POA: Diagnosis not present

## 2021-02-19 ENCOUNTER — Other Ambulatory Visit: Payer: Self-pay | Admitting: Family Medicine

## 2021-02-28 ENCOUNTER — Telehealth: Payer: Self-pay | Admitting: Adult Health

## 2021-02-28 NOTE — Telephone Encounter (Signed)
Scheduled appointment per 07/13 sch msg. Patient is aware

## 2021-03-05 ENCOUNTER — Telehealth: Payer: Self-pay | Admitting: Adult Health

## 2021-03-05 NOTE — Telephone Encounter (Signed)
Scheduled appts per 7/19 sch msg. Pt aware.  

## 2021-03-08 ENCOUNTER — Inpatient Hospital Stay: Payer: Medicare Other | Attending: Oncology

## 2021-03-08 ENCOUNTER — Inpatient Hospital Stay: Payer: Medicare Other

## 2021-03-08 ENCOUNTER — Other Ambulatory Visit: Payer: Self-pay

## 2021-03-08 VITALS — BP 148/68 | HR 62 | Temp 98.2°F | Resp 18

## 2021-03-08 DIAGNOSIS — Z79811 Long term (current) use of aromatase inhibitors: Secondary | ICD-10-CM | POA: Diagnosis not present

## 2021-03-08 DIAGNOSIS — C7800 Secondary malignant neoplasm of unspecified lung: Secondary | ICD-10-CM | POA: Diagnosis not present

## 2021-03-08 DIAGNOSIS — C50212 Malignant neoplasm of upper-inner quadrant of left female breast: Secondary | ICD-10-CM | POA: Insufficient documentation

## 2021-03-08 DIAGNOSIS — Z5112 Encounter for antineoplastic immunotherapy: Secondary | ICD-10-CM | POA: Insufficient documentation

## 2021-03-08 DIAGNOSIS — Z87891 Personal history of nicotine dependence: Secondary | ICD-10-CM | POA: Diagnosis not present

## 2021-03-08 DIAGNOSIS — Z170 Estrogen receptor positive status [ER+]: Secondary | ICD-10-CM

## 2021-03-08 DIAGNOSIS — Z79899 Other long term (current) drug therapy: Secondary | ICD-10-CM | POA: Insufficient documentation

## 2021-03-08 LAB — CBC WITH DIFFERENTIAL (CANCER CENTER ONLY)
Abs Immature Granulocytes: 0.03 10*3/uL (ref 0.00–0.07)
Basophils Absolute: 0 10*3/uL (ref 0.0–0.1)
Basophils Relative: 1 %
Eosinophils Absolute: 0.1 10*3/uL (ref 0.0–0.5)
Eosinophils Relative: 1 %
HCT: 44.6 % (ref 36.0–46.0)
Hemoglobin: 14.5 g/dL (ref 12.0–15.0)
Immature Granulocytes: 1 %
Lymphocytes Relative: 28 %
Lymphs Abs: 1.8 10*3/uL (ref 0.7–4.0)
MCH: 28.8 pg (ref 26.0–34.0)
MCHC: 32.5 g/dL (ref 30.0–36.0)
MCV: 88.5 fL (ref 80.0–100.0)
Monocytes Absolute: 0.5 10*3/uL (ref 0.1–1.0)
Monocytes Relative: 8 %
Neutro Abs: 4 10*3/uL (ref 1.7–7.7)
Neutrophils Relative %: 61 %
Platelet Count: 218 10*3/uL (ref 150–400)
RBC: 5.04 MIL/uL (ref 3.87–5.11)
RDW: 14.1 % (ref 11.5–15.5)
WBC Count: 6.4 10*3/uL (ref 4.0–10.5)
nRBC: 0 % (ref 0.0–0.2)

## 2021-03-08 LAB — CMP (CANCER CENTER ONLY)
ALT: 92 U/L — ABNORMAL HIGH (ref 0–44)
AST: 51 U/L — ABNORMAL HIGH (ref 15–41)
Albumin: 3.1 g/dL — ABNORMAL LOW (ref 3.5–5.0)
Alkaline Phosphatase: 103 U/L (ref 38–126)
Anion gap: 8 (ref 5–15)
BUN: 16 mg/dL (ref 8–23)
CO2: 31 mmol/L (ref 22–32)
Calcium: 10 mg/dL (ref 8.9–10.3)
Chloride: 100 mmol/L (ref 98–111)
Creatinine: 0.81 mg/dL (ref 0.44–1.00)
GFR, Estimated: >60 mL/min (ref >60)
Glucose, Bld: 359 mg/dL — ABNORMAL HIGH (ref 70–99)
Potassium: 4.9 mmol/L (ref 3.5–5.1)
Sodium: 139 mmol/L (ref 135–145)
Total Bilirubin: 0.3 mg/dL (ref 0.3–1.2)
Total Protein: 7.3 g/dL (ref 6.5–8.1)

## 2021-03-08 MED ORDER — SODIUM CHLORIDE 0.9 % IV SOLN
Freq: Once | INTRAVENOUS | Status: AC
  Filled 2021-03-08: qty 250

## 2021-03-08 MED ORDER — LORAZEPAM 2 MG/ML IJ SOLN
INTRAMUSCULAR | Status: AC
  Filled 2021-03-08: qty 1

## 2021-03-08 MED ORDER — LORAZEPAM 2 MG/ML IJ SOLN
0.5000 mg | Freq: Once | INTRAMUSCULAR | Status: AC
  Administered 2021-03-08: 0.5 mg via INTRAVENOUS

## 2021-03-08 MED ORDER — ACETAMINOPHEN 325 MG PO TABS
650.0000 mg | ORAL_TABLET | Freq: Once | ORAL | Status: AC
  Administered 2021-03-08: 650 mg via ORAL

## 2021-03-08 MED ORDER — ACETAMINOPHEN 325 MG PO TABS
ORAL_TABLET | ORAL | Status: AC
  Filled 2021-03-08: qty 2

## 2021-03-08 MED ORDER — DIPHENHYDRAMINE HCL 25 MG PO CAPS
25.0000 mg | ORAL_CAPSULE | Freq: Once | ORAL | Status: AC
  Administered 2021-03-08: 25 mg via ORAL

## 2021-03-08 MED ORDER — DIPHENHYDRAMINE HCL 25 MG PO CAPS
ORAL_CAPSULE | ORAL | Status: AC
  Filled 2021-03-08: qty 1

## 2021-03-08 MED ORDER — TRASTUZUMAB-ANNS CHEMO 150 MG IV SOLR
750.0000 mg | Freq: Once | INTRAVENOUS | Status: AC
  Administered 2021-03-08: 750 mg via INTRAVENOUS
  Filled 2021-03-08: qty 35.72

## 2021-03-08 NOTE — Patient Instructions (Signed)
[facility]  Discharge Instructions: Thank you for choosing [facility] to provide your oncology and hematology care.   If you have a lab appointment with the [facility], please go directly to the [facility] and check in at the registration area.   Wear comfortable clothing and clothing appropriate for easy access to any Portacath or PICC line.   We strive to give you quality time with your provider. You may need to reschedule your appointment if you arrive late (15 or more minutes).  Arriving late affects you and other patients whose appointments are after yours.  Also, if you miss three or more appointments without notifying the office, you may be dismissed from the clinic at the provider's discretion.      For prescription refill requests, have your pharmacy contact our office and allow 72 hours for refills to be completed.    Today you received the following chemotherapy and/or immunotherapy agents Herceptin      To help prevent nausea and vomiting after your treatment, we encourage you to take your nausea medication as directed.  BELOW ARE SYMPTOMS THAT SHOULD BE REPORTED IMMEDIATELY: *FEVER GREATER THAN 100.4 F (38 C) OR HIGHER *CHILLS OR SWEATING *NAUSEA AND VOMITING THAT IS NOT CONTROLLED WITH YOUR NAUSEA MEDICATION *UNUSUAL SHORTNESS OF BREATH *UNUSUAL BRUISING OR BLEEDING *URINARY PROBLEMS (pain or burning when urinating, or frequent urination) *BOWEL PROBLEMS (unusual diarrhea, constipation, pain near the anus) TENDERNESS IN MOUTH AND THROAT WITH OR WITHOUT PRESENCE OF ULCERS (sore throat, sores in mouth, or a toothache) UNUSUAL RASH, SWELLING OR PAIN  UNUSUAL VAGINAL DISCHARGE OR ITCHING   Items with * indicate a potential emergency and should be followed up as soon as possible or go to the Emergency Department if any problems should occur.  Please show the CHEMOTHERAPY ALERT CARD or IMMUNOTHERAPY ALERT CARD at check-in to  the Emergency Department and triage nurse.  Should you have questions after your visit or need to cancel or reschedule your appointment, please contact [facility]  Dept: [phone]  and follow the prompts.  Office hours are 8:00 a.m. to 4:30 p.m. Monday - Friday. Please note that voicemails left after 4:00 p.m. may not be returned until the following business day.  We are closed weekends and major holidays. You have access to a nurse at all times for urgent questions. Please call the main number to the clinic Dept: [phone] and follow the prompts.   For any non-urgent questions, you may also contact your provider using MyChart. We now offer e-Visits for anyone 54 and older to request care online for non-urgent symptoms. For details visit [email or web address].   Also download the MyChart app! Go to the app store, search "MyChart", open the app, select [facility], and log in with your MyChart username and password.  Due to Covid, a mask is required upon entering the hospital/clinic. If you do not have a mask, one will be given to you upon arrival. For doctor visits, patients may have 1 support person aged 30 or older with them. For treatment visits, patients cannot have anyone with them due to current Covid guidelines and our immunocompromised population.

## 2021-03-10 DIAGNOSIS — M25569 Pain in unspecified knee: Secondary | ICD-10-CM | POA: Diagnosis not present

## 2021-03-10 DIAGNOSIS — U071 COVID-19: Secondary | ICD-10-CM | POA: Diagnosis not present

## 2021-03-10 DIAGNOSIS — C3490 Malignant neoplasm of unspecified part of unspecified bronchus or lung: Secondary | ICD-10-CM | POA: Diagnosis not present

## 2021-03-10 DIAGNOSIS — R062 Wheezing: Secondary | ICD-10-CM | POA: Diagnosis not present

## 2021-03-10 DIAGNOSIS — I5020 Unspecified systolic (congestive) heart failure: Secondary | ICD-10-CM | POA: Diagnosis not present

## 2021-03-16 ENCOUNTER — Other Ambulatory Visit: Payer: Self-pay | Admitting: Family Medicine

## 2021-03-18 ENCOUNTER — Ambulatory Visit: Payer: Medicare Other | Admitting: Adult Health

## 2021-03-18 ENCOUNTER — Other Ambulatory Visit: Payer: Medicare Other

## 2021-03-20 ENCOUNTER — Other Ambulatory Visit: Payer: Self-pay | Admitting: Family Medicine

## 2021-03-25 ENCOUNTER — Other Ambulatory Visit: Payer: Medicare Other

## 2021-03-25 ENCOUNTER — Ambulatory Visit: Payer: Medicare Other | Admitting: Adult Health

## 2021-04-01 ENCOUNTER — Other Ambulatory Visit: Payer: Self-pay | Admitting: Oncology

## 2021-04-03 ENCOUNTER — Ambulatory Visit: Payer: Medicare Other | Admitting: Family Medicine

## 2021-04-05 ENCOUNTER — Other Ambulatory Visit: Payer: Self-pay

## 2021-04-05 ENCOUNTER — Inpatient Hospital Stay (HOSPITAL_BASED_OUTPATIENT_CLINIC_OR_DEPARTMENT_OTHER): Payer: Medicare Other | Admitting: Adult Health

## 2021-04-05 ENCOUNTER — Encounter: Payer: Self-pay | Admitting: Adult Health

## 2021-04-05 ENCOUNTER — Inpatient Hospital Stay: Payer: Medicare Other | Attending: Oncology

## 2021-04-05 ENCOUNTER — Inpatient Hospital Stay: Payer: Medicare Other

## 2021-04-05 VITALS — BP 143/75 | HR 63 | Temp 98.0°F | Resp 18

## 2021-04-05 VITALS — BP 164/75 | HR 79 | Temp 98.1°F | Resp 21 | Ht 64.0 in | Wt 321.1 lb

## 2021-04-05 DIAGNOSIS — Z170 Estrogen receptor positive status [ER+]: Secondary | ICD-10-CM

## 2021-04-05 DIAGNOSIS — J449 Chronic obstructive pulmonary disease, unspecified: Secondary | ICD-10-CM | POA: Insufficient documentation

## 2021-04-05 DIAGNOSIS — Z86718 Personal history of other venous thrombosis and embolism: Secondary | ICD-10-CM | POA: Insufficient documentation

## 2021-04-05 DIAGNOSIS — C50212 Malignant neoplasm of upper-inner quadrant of left female breast: Secondary | ICD-10-CM | POA: Insufficient documentation

## 2021-04-05 DIAGNOSIS — Z7901 Long term (current) use of anticoagulants: Secondary | ICD-10-CM | POA: Diagnosis not present

## 2021-04-05 DIAGNOSIS — Z79811 Long term (current) use of aromatase inhibitors: Secondary | ICD-10-CM | POA: Diagnosis not present

## 2021-04-05 DIAGNOSIS — I10 Essential (primary) hypertension: Secondary | ICD-10-CM | POA: Insufficient documentation

## 2021-04-05 DIAGNOSIS — Z803 Family history of malignant neoplasm of breast: Secondary | ICD-10-CM | POA: Insufficient documentation

## 2021-04-05 DIAGNOSIS — Z87891 Personal history of nicotine dependence: Secondary | ICD-10-CM | POA: Insufficient documentation

## 2021-04-05 DIAGNOSIS — Z5112 Encounter for antineoplastic immunotherapy: Secondary | ICD-10-CM | POA: Diagnosis not present

## 2021-04-05 DIAGNOSIS — Z8041 Family history of malignant neoplasm of ovary: Secondary | ICD-10-CM | POA: Diagnosis not present

## 2021-04-05 DIAGNOSIS — C7800 Secondary malignant neoplasm of unspecified lung: Secondary | ICD-10-CM | POA: Insufficient documentation

## 2021-04-05 DIAGNOSIS — M549 Dorsalgia, unspecified: Secondary | ICD-10-CM | POA: Diagnosis not present

## 2021-04-05 DIAGNOSIS — E119 Type 2 diabetes mellitus without complications: Secondary | ICD-10-CM | POA: Diagnosis not present

## 2021-04-05 DIAGNOSIS — Z794 Long term (current) use of insulin: Secondary | ICD-10-CM | POA: Insufficient documentation

## 2021-04-05 DIAGNOSIS — E1149 Type 2 diabetes mellitus with other diabetic neurological complication: Secondary | ICD-10-CM | POA: Diagnosis not present

## 2021-04-05 DIAGNOSIS — Z79899 Other long term (current) drug therapy: Secondary | ICD-10-CM | POA: Diagnosis not present

## 2021-04-05 DIAGNOSIS — G4730 Sleep apnea, unspecified: Secondary | ICD-10-CM | POA: Insufficient documentation

## 2021-04-05 LAB — CMP (CANCER CENTER ONLY)
ALT: 87 U/L — ABNORMAL HIGH (ref 0–44)
AST: 57 U/L — ABNORMAL HIGH (ref 15–41)
Albumin: 3 g/dL — ABNORMAL LOW (ref 3.5–5.0)
Alkaline Phosphatase: 117 U/L (ref 38–126)
Anion gap: 11 (ref 5–15)
BUN: 16 mg/dL (ref 8–23)
CO2: 26 mmol/L (ref 22–32)
Calcium: 9.4 mg/dL (ref 8.9–10.3)
Chloride: 102 mmol/L (ref 98–111)
Creatinine: 0.86 mg/dL (ref 0.44–1.00)
GFR, Estimated: >60 mL/min (ref >60)
Glucose, Bld: 386 mg/dL — ABNORMAL HIGH (ref 70–99)
Potassium: 4.2 mmol/L (ref 3.5–5.1)
Sodium: 139 mmol/L (ref 135–145)
Total Bilirubin: 0.4 mg/dL (ref 0.3–1.2)
Total Protein: 6.9 g/dL (ref 6.5–8.1)

## 2021-04-05 LAB — CBC WITH DIFFERENTIAL (CANCER CENTER ONLY)
Abs Immature Granulocytes: 0.02 10*3/uL (ref 0.00–0.07)
Basophils Absolute: 0 10*3/uL (ref 0.0–0.1)
Basophils Relative: 1 %
Eosinophils Absolute: 0.1 10*3/uL (ref 0.0–0.5)
Eosinophils Relative: 1 %
HCT: 43.3 % (ref 36.0–46.0)
Hemoglobin: 14.3 g/dL (ref 12.0–15.0)
Immature Granulocytes: 0 %
Lymphocytes Relative: 26 %
Lymphs Abs: 1.7 10*3/uL (ref 0.7–4.0)
MCH: 29 pg (ref 26.0–34.0)
MCHC: 33 g/dL (ref 30.0–36.0)
MCV: 87.8 fL (ref 80.0–100.0)
Monocytes Absolute: 0.4 10*3/uL (ref 0.1–1.0)
Monocytes Relative: 7 %
Neutro Abs: 4.3 10*3/uL (ref 1.7–7.7)
Neutrophils Relative %: 65 %
Platelet Count: 198 10*3/uL (ref 150–400)
RBC: 4.93 MIL/uL (ref 3.87–5.11)
RDW: 14.2 % (ref 11.5–15.5)
WBC Count: 6.6 10*3/uL (ref 4.0–10.5)
nRBC: 0 % (ref 0.0–0.2)

## 2021-04-05 MED ORDER — LORAZEPAM 2 MG/ML IJ SOLN
0.5000 mg | Freq: Once | INTRAMUSCULAR | Status: AC
  Administered 2021-04-05: 0.5 mg via INTRAVENOUS
  Filled 2021-04-05: qty 1

## 2021-04-05 MED ORDER — DIPHENHYDRAMINE HCL 25 MG PO CAPS
25.0000 mg | ORAL_CAPSULE | Freq: Once | ORAL | Status: AC
  Administered 2021-04-05: 25 mg via ORAL
  Filled 2021-04-05: qty 1

## 2021-04-05 MED ORDER — HEPARIN SOD (PORK) LOCK FLUSH 100 UNIT/ML IV SOLN
500.0000 [IU] | Freq: Once | INTRAVENOUS | Status: AC | PRN
  Administered 2021-04-05: 500 [IU]

## 2021-04-05 MED ORDER — INSULIN ASPART 100 UNIT/ML IJ SOLN
6.0000 [IU] | Freq: Once | INTRAMUSCULAR | Status: DC
  Administered 2021-04-05: 6 [IU] via SUBCUTANEOUS

## 2021-04-05 MED ORDER — SODIUM CHLORIDE 0.9% FLUSH
10.0000 mL | Freq: Once | INTRAVENOUS | Status: AC
  Administered 2021-04-05: 10 mL via INTRAVENOUS

## 2021-04-05 MED ORDER — SODIUM CHLORIDE 0.9 % IV SOLN: Freq: Once | INTRAVENOUS | Status: AC

## 2021-04-05 MED ORDER — INSULIN ASPART 100 UNIT/ML IJ SOLN
INTRAMUSCULAR | Status: AC
  Filled 2021-04-05: qty 1

## 2021-04-05 MED ORDER — ACETAMINOPHEN 325 MG PO TABS
650.0000 mg | ORAL_TABLET | Freq: Once | ORAL | Status: AC
  Administered 2021-04-05: 650 mg via ORAL
  Filled 2021-04-05: qty 2

## 2021-04-05 MED ORDER — TRASTUZUMAB-ANNS CHEMO 150 MG IV SOLR
750.0000 mg | Freq: Once | INTRAVENOUS | Status: AC
  Administered 2021-04-05: 750 mg via INTRAVENOUS
  Filled 2021-04-05: qty 35.72

## 2021-04-05 NOTE — Patient Instructions (Signed)
[name]  Discharge Instructions: Thank you for choosing [name] to provide your oncology and hematology care.   If you have a lab appointment with the [name], please go directly to the [name] and check in at the registration area.   Wear comfortable clothing and clothing appropriate for easy access to any Portacath or PICC line.   We strive to give you quality time with your provider. You may need to reschedule your appointment if you arrive late (15 or more minutes).  Arriving late affects you and other patients whose appointments are after yours.  Also, if you miss three or more appointments without notifying the office, you may be dismissed from the clinic at the provider's discretion.      For prescription refill requests, have your pharmacy contact our office and allow 72 hours for refills to be completed.    Today you received the following chemotherapy and/or immunotherapy agents trastuzumab      To help prevent nausea and vomiting after your treatment, we encourage you to take your nausea medication as directed.  BELOW ARE SYMPTOMS THAT SHOULD BE REPORTED IMMEDIATELY: *FEVER GREATER THAN 100.4 F (38 C) OR HIGHER *CHILLS OR SWEATING *NAUSEA AND VOMITING THAT IS NOT CONTROLLED WITH YOUR NAUSEA MEDICATION *UNUSUAL SHORTNESS OF BREATH *UNUSUAL BRUISING OR BLEEDING *URINARY PROBLEMS (pain or burning when urinating, or frequent urination) *BOWEL PROBLEMS (unusual diarrhea, constipation, pain near the anus) TENDERNESS IN MOUTH AND THROAT WITH OR WITHOUT PRESENCE OF ULCERS (sore throat, sores in mouth, or a toothache) UNUSUAL RASH, SWELLING OR PAIN  UNUSUAL VAGINAL DISCHARGE OR ITCHING   Items with * indicate a potential emergency and should be followed up as soon as possible or go to the Emergency Department if any problems should occur.  Please show the CHEMOTHERAPY ALERT CARD or IMMUNOTHERAPY ALERT CARD at check-in to  the Emergency Department and triage nurse.  Should you have questions after your visit or need to cancel or reschedule your appointment, please contact [name]  Dept: [phone]  and follow the prompts.  Office hours are 8:00 a.m. to 4:30 p.m. Monday - Friday. Please note that voicemails left after 4:00 p.m. may not be returned until the following business day.  We are closed weekends and major holidays. You have access to a nurse at all times for urgent questions. Please call the main number to the clinic Dept: [phone] and follow the prompts.   For any non-urgent questions, you may also contact your provider using MyChart. We now offer e-Visits for anyone 18 and older to request care online for non-urgent symptoms. For details visit [email or web address].   Also download the MyChart app! Go to the app store, search "MyChart", open the app, select [facility], and log in with your MyChart username and password.  Due to Covid, a mask is required upon entering the hospital/clinic. If you do not have a mask, one will be given to you upon arrival. For doctor visits, patients may have 1 support person aged 18 or older with them. For treatment visits, patients cannot have anyone with them due to current Covid guidelines and our immunocompromised population.   
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
## 2021-04-07 ENCOUNTER — Telehealth: Payer: Self-pay | Admitting: Family Medicine

## 2021-04-07 ENCOUNTER — Other Ambulatory Visit: Payer: Self-pay | Admitting: Family Medicine

## 2021-04-07 MED ORDER — INSULIN LISPRO (1 UNIT DIAL) 100 UNIT/ML (KWIKPEN): PEN_INJECTOR | SUBCUTANEOUS | 1 refills | Status: DC

## 2021-04-07 NOTE — Telephone Encounter (Signed)
**  After Hours/ Emergency Line Call**  Received a call to report that [name] ran out of her Humalog. She still has Lantus but would like refill of her Humalog. She has an appointment with her PCP on 9/14. Encouraged follow up. Refill sent to preferred pharmacy.  Will forward to PCP.  Sharion Settler, DO PGY-2, [facility] 04/07/2021 1:13 PM

## 2021-04-08 ENCOUNTER — Telehealth: Payer: Self-pay | Admitting: Adult Health

## 2021-04-08 NOTE — Telephone Encounter (Signed)
Scheduled appts per 8/19 los. Will have updated calendar printed for pt at next visit.

## 2021-04-09 DIAGNOSIS — R062 Wheezing: Secondary | ICD-10-CM | POA: Diagnosis not present

## 2021-04-09 DIAGNOSIS — C3490 Malignant neoplasm of unspecified part of unspecified bronchus or lung: Secondary | ICD-10-CM | POA: Diagnosis not present

## 2021-04-09 DIAGNOSIS — U071 COVID-19: Secondary | ICD-10-CM | POA: Diagnosis not present

## 2021-04-09 DIAGNOSIS — I5020 Unspecified systolic (congestive) heart failure: Secondary | ICD-10-CM | POA: Diagnosis not present

## 2021-04-09 DIAGNOSIS — M25569 Pain in unspecified knee: Secondary | ICD-10-CM | POA: Diagnosis not present

## 2021-04-10 DIAGNOSIS — I5020 Unspecified systolic (congestive) heart failure: Secondary | ICD-10-CM | POA: Diagnosis not present

## 2021-04-10 DIAGNOSIS — M25569 Pain in unspecified knee: Secondary | ICD-10-CM | POA: Diagnosis not present

## 2021-04-10 DIAGNOSIS — C3490 Malignant neoplasm of unspecified part of unspecified bronchus or lung: Secondary | ICD-10-CM | POA: Diagnosis not present

## 2021-04-10 DIAGNOSIS — U071 COVID-19: Secondary | ICD-10-CM | POA: Diagnosis not present

## 2021-04-10 DIAGNOSIS — R062 Wheezing: Secondary | ICD-10-CM | POA: Diagnosis not present

## 2021-04-26 ENCOUNTER — Other Ambulatory Visit: Payer: Medicare Other

## 2021-04-26 ENCOUNTER — Ambulatory Visit: Payer: Medicare Other

## 2021-05-01 ENCOUNTER — Ambulatory Visit: Payer: Medicare Other | Admitting: Family Medicine

## 2021-05-03 ENCOUNTER — Inpatient Hospital Stay: Payer: Medicare Other | Attending: Oncology

## 2021-05-03 ENCOUNTER — Other Ambulatory Visit: Payer: Self-pay

## 2021-05-03 ENCOUNTER — Inpatient Hospital Stay: Payer: Medicare Other

## 2021-05-03 VITALS — BP 142/72 | HR 62 | Temp 98.2°F | Resp 18

## 2021-05-03 DIAGNOSIS — Z5112 Encounter for antineoplastic immunotherapy: Secondary | ICD-10-CM | POA: Insufficient documentation

## 2021-05-03 DIAGNOSIS — C50212 Malignant neoplasm of upper-inner quadrant of left female breast: Secondary | ICD-10-CM

## 2021-05-03 DIAGNOSIS — Z86718 Personal history of other venous thrombosis and embolism: Secondary | ICD-10-CM | POA: Diagnosis not present

## 2021-05-03 DIAGNOSIS — Z7901 Long term (current) use of anticoagulants: Secondary | ICD-10-CM | POA: Diagnosis not present

## 2021-05-03 DIAGNOSIS — C7800 Secondary malignant neoplasm of unspecified lung: Secondary | ICD-10-CM | POA: Insufficient documentation

## 2021-05-03 DIAGNOSIS — Z79899 Other long term (current) drug therapy: Secondary | ICD-10-CM | POA: Insufficient documentation

## 2021-05-03 DIAGNOSIS — Z170 Estrogen receptor positive status [ER+]: Secondary | ICD-10-CM

## 2021-05-03 LAB — CMP (CANCER CENTER ONLY)
ALT: 73 U/L — ABNORMAL HIGH (ref 0–44)
AST: 42 U/L — ABNORMAL HIGH (ref 15–41)
Albumin: 3.2 g/dL — ABNORMAL LOW (ref 3.5–5.0)
Alkaline Phosphatase: 101 U/L (ref 38–126)
Anion gap: 9 (ref 5–15)
BUN: 16 mg/dL (ref 8–23)
CO2: 29 mmol/L (ref 22–32)
Calcium: 9.6 mg/dL (ref 8.9–10.3)
Chloride: 100 mmol/L (ref 98–111)
Creatinine: 0.89 mg/dL (ref 0.44–1.00)
GFR, Estimated: >60 mL/min (ref >60)
Glucose, Bld: 335 mg/dL — ABNORMAL HIGH (ref 70–99)
Potassium: 4.8 mmol/L (ref 3.5–5.1)
Sodium: 138 mmol/L (ref 135–145)
Total Bilirubin: 0.4 mg/dL (ref 0.3–1.2)
Total Protein: 7.4 g/dL (ref 6.5–8.1)

## 2021-05-03 LAB — CBC WITH DIFFERENTIAL (CANCER CENTER ONLY)
Abs Immature Granulocytes: 0.03 10*3/uL (ref 0.00–0.07)
Basophils Absolute: 0 10*3/uL (ref 0.0–0.1)
Basophils Relative: 0 %
Eosinophils Absolute: 0.1 10*3/uL (ref 0.0–0.5)
Eosinophils Relative: 1 %
HCT: 45 % (ref 36.0–46.0)
Hemoglobin: 14.9 g/dL (ref 12.0–15.0)
Immature Granulocytes: 0 %
Lymphocytes Relative: 31 %
Lymphs Abs: 2.4 10*3/uL (ref 0.7–4.0)
MCH: 29 pg (ref 26.0–34.0)
MCHC: 33.1 g/dL (ref 30.0–36.0)
MCV: 87.5 fL (ref 80.0–100.0)
Monocytes Absolute: 0.6 10*3/uL (ref 0.1–1.0)
Monocytes Relative: 7 %
Neutro Abs: 4.7 10*3/uL (ref 1.7–7.7)
Neutrophils Relative %: 61 %
Platelet Count: 220 10*3/uL (ref 150–400)
RBC: 5.14 MIL/uL — ABNORMAL HIGH (ref 3.87–5.11)
RDW: 13.9 % (ref 11.5–15.5)
WBC Count: 7.8 10*3/uL (ref 4.0–10.5)
nRBC: 0 % (ref 0.0–0.2)

## 2021-05-03 MED ORDER — LORAZEPAM 2 MG/ML IJ SOLN
0.5000 mg | Freq: Once | INTRAMUSCULAR | Status: AC
  Administered 2021-05-03: 0.5 mg via INTRAVENOUS
  Filled 2021-05-03: qty 1

## 2021-05-03 MED ORDER — DIPHENHYDRAMINE HCL 25 MG PO CAPS
25.0000 mg | ORAL_CAPSULE | Freq: Once | ORAL | Status: AC
  Administered 2021-05-03: 25 mg via ORAL
  Filled 2021-05-03: qty 1

## 2021-05-03 MED ORDER — ACETAMINOPHEN 325 MG PO TABS
650.0000 mg | ORAL_TABLET | Freq: Once | ORAL | Status: AC
  Administered 2021-05-03: 650 mg via ORAL
  Filled 2021-05-03: qty 2

## 2021-05-03 MED ORDER — TRASTUZUMAB-ANNS CHEMO 150 MG IV SOLR
750.0000 mg | Freq: Once | INTRAVENOUS | Status: AC
  Administered 2021-05-03: 750 mg via INTRAVENOUS
  Filled 2021-05-03: qty 35.72

## 2021-05-03 MED ORDER — SODIUM CHLORIDE 0.9 % IV SOLN: Freq: Once | INTRAVENOUS | Status: DC

## 2021-05-03 NOTE — Patient Instructions (Signed)
[facility]  Discharge Instructions: Thank you for choosing [facility] to provide your oncology and hematology care.   If you have a lab appointment with the Cancer Center, please go directly to the Cancer Center and check in at the registration area.   Wear comfortable clothing and clothing appropriate for easy access to any Portacath or PICC line.   We strive to give you quality time with your provider. You may need to reschedule your appointment if you arrive late (15 or more minutes).  Arriving late affects you and other patients whose appointments are after yours.  Also, if you miss three or more appointments without notifying the office, you may be dismissed from the clinic at the provider's discretion.      For prescription refill requests, have your pharmacy contact our office and allow 72 hours for refills to be completed.    Today you received the following chemotherapy and/or immunotherapy agents trastuzumab      To help prevent nausea and vomiting after your treatment, we encourage you to take your nausea medication as directed.  BELOW ARE SYMPTOMS THAT SHOULD BE REPORTED IMMEDIATELY: *FEVER GREATER THAN 100.4 F (38 C) OR HIGHER *CHILLS OR SWEATING *NAUSEA AND VOMITING THAT IS NOT CONTROLLED WITH YOUR NAUSEA MEDICATION *UNUSUAL SHORTNESS OF BREATH *UNUSUAL BRUISING OR BLEEDING *URINARY PROBLEMS (pain or burning when urinating, or frequent urination) *BOWEL PROBLEMS (unusual diarrhea, constipation, pain near the anus) TENDERNESS IN MOUTH AND THROAT WITH OR WITHOUT PRESENCE OF ULCERS (sore throat, sores in mouth, or a toothache) UNUSUAL RASH, SWELLING OR PAIN  UNUSUAL VAGINAL DISCHARGE OR ITCHING   Items with * indicate a potential emergency and should be followed up as soon as possible or go to the Emergency Department if any problems should occur.  Please show the CHEMOTHERAPY ALERT CARD or IMMUNOTHERAPY ALERT CARD at check-in to  the Emergency Department and triage nurse.  Should you have questions after your visit or need to cancel or reschedule your appointment, please contact [facility]  Dept: [phone]  and follow the prompts.  Office hours are 8:00 a.m. to 4:30 p.m. Monday - Friday. Please note that voicemails left after 4:00 p.m. may not be returned until the following business day.  We are closed weekends and major holidays. You have access to a nurse at all times for urgent questions. Please call the main number to the clinic Dept: [phone] and follow the prompts.   For any non-urgent questions, you may also contact your provider using MyChart. We now offer e-Visits for anyone 18 and older to request care online for non-urgent symptoms. For details visit [email or web address].   Also download the MyChart app! Go to the app store, search "MyChart", open the app, select [facility], and log in with your MyChart username and password.  Due to Covid, a mask is required upon entering the hospital/clinic. If you do not have a mask, one will be given to you upon arrival. For doctor visits, patients may have 1 support person aged 18 or older with them. For treatment visits, patients cannot have anyone with them due to current Covid guidelines and our immunocompromised population.   

## 2021-05-10 DIAGNOSIS — I5020 Unspecified systolic (congestive) heart failure: Secondary | ICD-10-CM | POA: Diagnosis not present

## 2021-05-10 DIAGNOSIS — U071 COVID-19: Secondary | ICD-10-CM | POA: Diagnosis not present

## 2021-05-10 DIAGNOSIS — M25569 Pain in unspecified knee: Secondary | ICD-10-CM | POA: Diagnosis not present

## 2021-05-10 DIAGNOSIS — C3490 Malignant neoplasm of unspecified part of unspecified bronchus or lung: Secondary | ICD-10-CM | POA: Diagnosis not present

## 2021-05-10 DIAGNOSIS — R062 Wheezing: Secondary | ICD-10-CM | POA: Diagnosis not present

## 2021-05-11 DIAGNOSIS — M25569 Pain in unspecified knee: Secondary | ICD-10-CM | POA: Diagnosis not present

## 2021-05-11 DIAGNOSIS — C3490 Malignant neoplasm of unspecified part of unspecified bronchus or lung: Secondary | ICD-10-CM | POA: Diagnosis not present

## 2021-05-11 DIAGNOSIS — R062 Wheezing: Secondary | ICD-10-CM | POA: Diagnosis not present

## 2021-05-11 DIAGNOSIS — I5020 Unspecified systolic (congestive) heart failure: Secondary | ICD-10-CM | POA: Diagnosis not present

## 2021-05-11 DIAGNOSIS — U071 COVID-19: Secondary | ICD-10-CM | POA: Diagnosis not present

## 2021-05-21 ENCOUNTER — Ambulatory Visit (INDEPENDENT_AMBULATORY_CARE_PROVIDER_SITE_OTHER): Payer: Medicare Other | Admitting: Family Medicine

## 2021-05-21 ENCOUNTER — Other Ambulatory Visit: Payer: Self-pay

## 2021-05-21 ENCOUNTER — Encounter: Payer: Self-pay | Admitting: Family Medicine

## 2021-05-21 VITALS — BP 140/70 | HR 66 | Ht 64.0 in

## 2021-05-21 DIAGNOSIS — M4716 Other spondylosis with myelopathy, lumbar region: Secondary | ICD-10-CM

## 2021-05-21 DIAGNOSIS — R5382 Chronic fatigue, unspecified: Secondary | ICD-10-CM

## 2021-05-21 DIAGNOSIS — E1149 Type 2 diabetes mellitus with other diabetic neurological complication: Secondary | ICD-10-CM

## 2021-05-21 LAB — POCT GLYCOSYLATED HEMOGLOBIN (HGB A1C): HbA1c, POC (controlled diabetic range): 9.6 % — AB (ref 0.0–7.0)

## 2021-05-21 MED ORDER — METFORMIN HCL ER 500 MG PO TB24: 500.0000 mg | ORAL_TABLET | Freq: Every day | ORAL | 3 refills | Status: DC

## 2021-05-21 NOTE — Assessment & Plan Note (Signed)
Severe.  Not a good candidate for opiod pain medication and does not want other medication.  If she will attend regularly and her diabetes improves maybe able to investigate other options - Physical Therapy, other analgesics

## 2021-05-21 NOTE — Assessment & Plan Note (Signed)
Poorly Controlled.  We had a frank discussion that I believe many of her symptoms could be due to her severe hyperglycemia and how much better she could feel if we could get her on a good regimen for her diabetes that would also helps with weight loss.  This would entail her visiting [place] at least monthly for the first 6 months to try and titrate medications.  She does not want to and has a number of issues with transportation and fatigue.  In the end she did commit to this plan.    Agreed to try low dose Metformin XR and to meet with Rachelle to discuss starting GLP1

## 2021-05-21 NOTE — Patient Instructions (Signed)
Good to see you today - Thank you for coming in  Things we discussed today:  Increase your Lantus to 36 u each AM Start Metformin XR 500 every AM Let me know how your are doing in one week via MyChart  Bring all your bottles and insulin next visit Make an appointment to see Rachelle in 2-3 weeks    Come back to see me in 2 months

## 2021-05-21 NOTE — Assessment & Plan Note (Signed)
Very likely multifactorial but certainly need to begin with getting her blood sugar better controlled.  Hopefully this will also help her weight.  Recent labs do not show any obvious other cause.  She is not interested currently in any prevention screening

## 2021-05-21 NOTE — Progress Notes (Signed)
    SUBJECTIVE:   CHIEF COMPLAINT / HPI:   Diabetes Her blood sugar are rarely less than 250.  Feels "bad" if less than 200.  Taking only insulin Lantus 32 units daily and novolog 13-16u usually three times a day.  Never started ozempic - was too scared.  Urinates frequently and often has a yeast infection  Fatigue Usually feels tired.   Almost always has chest pain that does not change with exertion.  Rarely feels lightheadness when stands. Can walk about 20 steps then is too tired.  No weight loss or fevers.   Recent CMET noted  Back Pain Constant worse with exertion.  Not taking any medications including gabapentin.  No new bowel or bladder changes or new lower extremity weakness  PERTINENT  PMH / PSH: Uses O2 most of the time except when walking when is too much bother  OBJECTIVE:   BP 140/70   Pulse 66   Ht 5\' 4"  (1.626 m)   SpO2 96%   BMI 55.12 kg/m   Alert interactive Heart - Regular rate and rhythm.  No murmurs, gallops or rubs.    Lungs - breath sounds distant no wheeze Legs - trace edema   ASSESSMENT/PLAN:   Diabetes mellitus type 2 with neurological manifestations (HCC) Poorly Controlled.  We had a frank discussion that I believe many of her symptoms could be due to her severe hyperglycemia and how much better she could feel if we could get her on a good regimen for her diabetes that would also helps with weight loss.  This would entail her visiting [place] at least monthly for the first 6 months to try and titrate medications.  She does not want to and has a number of issues with transportation and fatigue.  In the end she did commit to this plan.    Agreed to try low dose Metformin XR and to meet with Rachelle to discuss starting GLP1   Osteoarthritis of spine Severe.  Not a good candidate for opiod pain medication and does not want other medication.  If she will attend regularly and her diabetes improves maybe able to investigate other options - Physical Therapy, other  analgesics  Fatigue Very likely multifactorial but certainly need to begin with getting her blood sugar better controlled.  Hopefully this will also help her weight.  Recent labs do not show any obvious other cause.  She is not interested currently in any prevention screening      [name], [place]

## 2021-05-30 ENCOUNTER — Other Ambulatory Visit: Payer: Self-pay | Admitting: *Deleted

## 2021-05-30 DIAGNOSIS — Z170 Estrogen receptor positive status [ER+]: Secondary | ICD-10-CM

## 2021-05-30 DIAGNOSIS — C50212 Malignant neoplasm of upper-inner quadrant of left female breast: Secondary | ICD-10-CM

## 2021-05-31 ENCOUNTER — Other Ambulatory Visit: Payer: Self-pay | Admitting: Hematology and Oncology

## 2021-05-31 ENCOUNTER — Inpatient Hospital Stay: Payer: Medicare Other | Attending: Oncology

## 2021-05-31 ENCOUNTER — Other Ambulatory Visit: Payer: Self-pay | Admitting: Family Medicine

## 2021-05-31 ENCOUNTER — Other Ambulatory Visit: Payer: Self-pay

## 2021-05-31 ENCOUNTER — Inpatient Hospital Stay: Payer: Medicare Other

## 2021-05-31 VITALS — BP 112/95 | HR 60 | Temp 98.0°F | Resp 18 | Ht 64.0 in

## 2021-05-31 DIAGNOSIS — C50212 Malignant neoplasm of upper-inner quadrant of left female breast: Secondary | ICD-10-CM | POA: Diagnosis not present

## 2021-05-31 DIAGNOSIS — C7800 Secondary malignant neoplasm of unspecified lung: Secondary | ICD-10-CM | POA: Insufficient documentation

## 2021-05-31 DIAGNOSIS — Z171 Estrogen receptor negative status [ER-]: Secondary | ICD-10-CM | POA: Diagnosis not present

## 2021-05-31 DIAGNOSIS — Z5112 Encounter for antineoplastic immunotherapy: Secondary | ICD-10-CM | POA: Insufficient documentation

## 2021-05-31 DIAGNOSIS — Z95828 Presence of other vascular implants and grafts: Secondary | ICD-10-CM

## 2021-05-31 DIAGNOSIS — Z170 Estrogen receptor positive status [ER+]: Secondary | ICD-10-CM

## 2021-05-31 LAB — CBC WITH DIFFERENTIAL (CANCER CENTER ONLY)
Abs Immature Granulocytes: 0.02 10*3/uL (ref 0.00–0.07)
Basophils Absolute: 0 10*3/uL (ref 0.0–0.1)
Basophils Relative: 0 %
Eosinophils Absolute: 0.1 10*3/uL (ref 0.0–0.5)
Eosinophils Relative: 2 %
HCT: 42.9 % (ref 36.0–46.0)
Hemoglobin: 14.1 g/dL (ref 12.0–15.0)
Immature Granulocytes: 0 %
Lymphocytes Relative: 36 %
Lymphs Abs: 2.4 10*3/uL (ref 0.7–4.0)
MCH: 28.8 pg (ref 26.0–34.0)
MCHC: 32.9 g/dL (ref 30.0–36.0)
MCV: 87.6 fL (ref 80.0–100.0)
Monocytes Absolute: 0.5 10*3/uL (ref 0.1–1.0)
Monocytes Relative: 7 %
Neutro Abs: 3.7 10*3/uL (ref 1.7–7.7)
Neutrophils Relative %: 55 %
Platelet Count: 192 10*3/uL (ref 150–400)
RBC: 4.9 MIL/uL (ref 3.87–5.11)
RDW: 13.6 % (ref 11.5–15.5)
WBC Count: 6.7 10*3/uL (ref 4.0–10.5)
nRBC: 0 % (ref 0.0–0.2)

## 2021-05-31 LAB — CMP (CANCER CENTER ONLY)
ALT: 99 U/L — ABNORMAL HIGH (ref 0–44)
AST: 56 U/L — ABNORMAL HIGH (ref 15–41)
Albumin: 3 g/dL — ABNORMAL LOW (ref 3.5–5.0)
Alkaline Phosphatase: 102 U/L (ref 38–126)
Anion gap: 9 (ref 5–15)
BUN: 16 mg/dL (ref 8–23)
CO2: 28 mmol/L (ref 22–32)
Calcium: 9.4 mg/dL (ref 8.9–10.3)
Chloride: 102 mmol/L (ref 98–111)
Creatinine: 0.82 mg/dL (ref 0.44–1.00)
GFR, Estimated: >60 mL/min (ref >60)
Glucose, Bld: 265 mg/dL — ABNORMAL HIGH (ref 70–99)
Potassium: 4.3 mmol/L (ref 3.5–5.1)
Sodium: 139 mmol/L (ref 135–145)
Total Bilirubin: 0.3 mg/dL (ref 0.3–1.2)
Total Protein: 7.1 g/dL (ref 6.5–8.1)

## 2021-05-31 MED ORDER — HEPARIN SOD (PORK) LOCK FLUSH 100 UNIT/ML IV SOLN
500.0000 [IU] | Freq: Once | INTRAVENOUS | Status: AC | PRN
  Administered 2021-05-31: 500 [IU]

## 2021-05-31 MED ORDER — SODIUM CHLORIDE 0.9 % IV SOLN: Freq: Once | INTRAVENOUS | Status: AC

## 2021-05-31 MED ORDER — ACETAMINOPHEN 325 MG PO TABS
650.0000 mg | ORAL_TABLET | Freq: Once | ORAL | Status: AC
  Administered 2021-05-31: 650 mg via ORAL
  Filled 2021-05-31: qty 2

## 2021-05-31 MED ORDER — DIPHENHYDRAMINE HCL 25 MG PO CAPS
25.0000 mg | ORAL_CAPSULE | Freq: Once | ORAL | Status: AC
  Administered 2021-05-31: 25 mg via ORAL
  Filled 2021-05-31: qty 1

## 2021-05-31 MED ORDER — SODIUM CHLORIDE 0.9% FLUSH
10.0000 mL | Freq: Once | INTRAVENOUS | Status: AC
  Administered 2021-05-31: 10 mL

## 2021-05-31 MED ORDER — TRASTUZUMAB-ANNS CHEMO 150 MG IV SOLR
750.0000 mg | Freq: Once | INTRAVENOUS | Status: AC
  Administered 2021-05-31: 750 mg via INTRAVENOUS
  Filled 2021-05-31: qty 35.72

## 2021-05-31 MED ORDER — LORAZEPAM 2 MG/ML IJ SOLN
0.5000 mg | Freq: Once | INTRAMUSCULAR | Status: AC
  Administered 2021-05-31: 0.5 mg via INTRAVENOUS
  Filled 2021-05-31: qty 1

## 2021-05-31 MED ORDER — SODIUM CHLORIDE 0.9% FLUSH
10.0000 mL | Freq: Once | INTRAVENOUS | Status: AC
  Administered 2021-05-31: 10 mL via INTRAVENOUS

## 2021-05-31 NOTE — Patient Instructions (Signed)
[facility]  Discharge Instructions: Thank you for choosing [facility] to provide your oncology and hematology care.   If you have a lab appointment with the [facility], please go directly to the [facility] and check in at the registration area.   Wear comfortable clothing and clothing appropriate for easy access to any Portacath or PICC line.   We strive to give you quality time with your provider. You may need to reschedule your appointment if you arrive late (15 or more minutes).  Arriving late affects you and other patients whose appointments are after yours.  Also, if you miss three or more appointments without notifying the office, you may be dismissed from the clinic at the provider's discretion.      For prescription refill requests, have your pharmacy contact our office and allow 72 hours for refills to be completed.    Today you received the following chemotherapy and/or immunotherapy agent: Trastuzumab (Kanjinti)   To help prevent nausea and vomiting after your treatment, we encourage you to take your nausea medication as directed.  BELOW ARE SYMPTOMS THAT SHOULD BE REPORTED IMMEDIATELY: *FEVER GREATER THAN 100.4 F (38 C) OR HIGHER *CHILLS OR SWEATING *NAUSEA AND VOMITING THAT IS NOT CONTROLLED WITH YOUR NAUSEA MEDICATION *UNUSUAL SHORTNESS OF BREATH *UNUSUAL BRUISING OR BLEEDING *URINARY PROBLEMS (pain or burning when urinating, or frequent urination) *BOWEL PROBLEMS (unusual diarrhea, constipation, pain near the anus) TENDERNESS IN MOUTH AND THROAT WITH OR WITHOUT PRESENCE OF ULCERS (sore throat, sores in mouth, or a toothache) UNUSUAL RASH, SWELLING OR PAIN  UNUSUAL VAGINAL DISCHARGE OR ITCHING   Items with * indicate a potential emergency and should be followed up as soon as possible or go to the Emergency Department if any problems should occur.  Please show the CHEMOTHERAPY ALERT CARD or IMMUNOTHERAPY ALERT CARD at  check-in to the Emergency Department and triage nurse.  Should you have questions after your visit or need to cancel or reschedule your appointment, please contact [facility]  Dept: [phone]  and follow the prompts.  Office hours are 8:00 a.m. to 4:30 p.m. Monday - Friday. Please note that voicemails left after 4:00 p.m. may not be returned until the following business day.  We are closed weekends and major holidays. You have access to a nurse at all times for urgent questions. Please call the main number to the clinic Dept: [phone] and follow the prompts.   For any non-urgent questions, you may also contact your provider using MyChart. We now offer e-Visits for anyone 71 and older to request care online for non-urgent symptoms. For details visit [email or web address].   Also download the MyChart app! Go to the app store, search "MyChart", open the app, select [facility], and log in with your MyChart username and password.  Due to Covid, a mask is required upon entering the hospital/clinic. If you do not have a mask, one will be given to you upon arrival. For doctor visits, patients may have 1 support person aged 71 or older with them. For treatment visits, patients cannot have anyone with them due to current Covid guidelines and our immunocompromised population.

## 2021-05-31 NOTE — Progress Notes (Signed)
Per Dr. Alvy Bimler, okay to treat with ECHO from 10/2020.

## 2021-06-09 DIAGNOSIS — I5020 Unspecified systolic (congestive) heart failure: Secondary | ICD-10-CM | POA: Diagnosis not present

## 2021-06-09 DIAGNOSIS — C3490 Malignant neoplasm of unspecified part of unspecified bronchus or lung: Secondary | ICD-10-CM | POA: Diagnosis not present

## 2021-06-09 DIAGNOSIS — M25569 Pain in unspecified knee: Secondary | ICD-10-CM | POA: Diagnosis not present

## 2021-06-09 DIAGNOSIS — R062 Wheezing: Secondary | ICD-10-CM | POA: Diagnosis not present

## 2021-06-09 DIAGNOSIS — U071 COVID-19: Secondary | ICD-10-CM | POA: Diagnosis not present

## 2021-06-10 DIAGNOSIS — U071 COVID-19: Secondary | ICD-10-CM | POA: Diagnosis not present

## 2021-06-10 DIAGNOSIS — M25569 Pain in unspecified knee: Secondary | ICD-10-CM | POA: Diagnosis not present

## 2021-06-10 DIAGNOSIS — I5020 Unspecified systolic (congestive) heart failure: Secondary | ICD-10-CM | POA: Diagnosis not present

## 2021-06-10 DIAGNOSIS — R062 Wheezing: Secondary | ICD-10-CM | POA: Diagnosis not present

## 2021-06-10 DIAGNOSIS — C3490 Malignant neoplasm of unspecified part of unspecified bronchus or lung: Secondary | ICD-10-CM | POA: Diagnosis not present

## 2021-06-17 ENCOUNTER — Other Ambulatory Visit: Payer: Self-pay

## 2021-06-17 ENCOUNTER — Ambulatory Visit (INDEPENDENT_AMBULATORY_CARE_PROVIDER_SITE_OTHER): Payer: Medicare Other | Admitting: Pharmacist

## 2021-06-17 DIAGNOSIS — E1149 Type 2 diabetes mellitus with other diabetic neurological complication: Secondary | ICD-10-CM | POA: Diagnosis not present

## 2021-06-17 NOTE — Patient Instructions (Addendum)
Miss Manard it was a pleasure seeing you today.   The sensor is small waterproof disc that is placed on the back of the upper arm.  There is a very thin filament that is inserted under the surface of the skin and measures the amount of glucose in the interstitial fluid.  This system collects your sugar levels for up to 14 days and it automatically records the glucose level every 15 minutes. This will show your provider any patterns in your glucose levels.  Please remember... 1. Sensor will last 14 days 2. Sensor should be applied to area away from scarring, tattoos, irritation, and bones. 3. Starting the sensor: 1 hour warm up before BG readings available   4. Scan the sensor at least every 8 hours 5. Hold reader within 1.5 inches of sensor to scan 6. When the blood drop and magnifying glass symbol appears, test fingerstick blood glucose prior to making treatment decisions 7. Do a fingerstick blood glucose test if the sensor readings do not match how you feel 8. Remove sensor prior to magnetic resonance imaging (MRI), computed tomography (CT) scan, or high-frequency electrical heat (diathermy) treatment. 9. Freestyle Libre may be worn through a Environmental education officer. It may not be exposed to an advanced Imaging Technology (AIT) body scanner (also called a millimeter wave scanner) or the baggage x-ray machine. Instead, ask for hand-wanding or full-body pat-down and visual inspection.  10. Doses of vitamin C (ascorbic acid) >500 mg every day may cause false high readings. 11. Do not submerge more than 3 feet or keep underwater longer than 30 minutes at a time. Gently pat to dry.  12. Store sensor kit between 39 and 77 degrees Farenheit. Can be refrigerated within this temperature range.  Problems with Freestyle Libre sticking? 1. Order Tegaderm I.V. films to place directly over [facility] sensor on arm. 2. May also order Skin Tac from [facility]. Alcohol swab area you plan to administer  Freestyle Libre then let dry. Once dry, apply Skin Tac in a circular motion (with a spot in the middle for sensor without skin tac) and let dry. Once dry you can apply Freestyle Libre   Problems taking off Freestyle Plato? 1. Remember to try to shower before removing Freestyle Libre 2. Order Tac Away to help remove any extra adhesive left on your skin once you remove Freestyle Libre 3. May also try baby oil to loosen adhesive  [facility] Phone number: [phone] Available 7 days a week; excluding holidays 8 AM to 8PM EST  [email or web address]  Please do the following:  Take one metformin with your first meal and the second metformin with your second meal as directed today during your appointment. If you have any questions or if you believe something has occurred because of this change, call me or your doctor to let one of [place] know.  Start Ozempic 0.$RemoveBeforeD'25mg'[id number]$  once weekly on a Monday or Tuesday Continue checking blood sugars at home. It's really important that you record these and bring these in to your next doctor's appointment.  Continue making the lifestyle changes we've discussed together during our visit. Diet and exercise play a significant role in improving your blood sugars.  Follow-up with Dr. Erin Hearing in one month  Hypoglycemia or low blood sugar:   Low blood sugar can happen quickly and may become an emergency if not treated right away.   While this shouldn't happen often, it can be brought upon if you skip a meal or do not  eat enough. Also, if your insulin or other diabetes medications are dosed too high, this can cause your blood sugar to go to low.   Warning signs of low blood sugar include: Feeling shaky or dizzy Feeling weak or tired  Excessive hunger Feeling anxious or upset  Sweating even when you aren't exercising  What to do if I experience low blood sugar? Follow the Rule of 15 Check your blood sugar. If lower than 70, proceed to step 2.  Treat  with 15 grams of fast acting carbs which is found in 3-4 glucose tablets. If none are available you can try hard candy, 1 tablespoon of sugar or honey,4 ounces of fruit juice, or 6 ounces of REGULAR soda.  Re-check your sugar in 15 minutes. If it is still below 70, do what you did in step 2 again. If your blood sugar has come back up, go ahead and eat a snack or small meal made up of complex carbs (ex. Whole grains) and protein at this time to avoid recurrence of low blood sugar.

## 2021-06-17 NOTE — Progress Notes (Signed)
Subjective:    Patient ID: [name], female    DOB: [birth year], 70 y.o.   MRN: [id number]  HPI Patient is a 70 y.o. female who presents for diabetes management. She is in good spirits and presents with assistance of wheelchair. Patient was referred and last seen by Primary Care Provider on 05/21/21.  Patient reports diabetes was diagnosed around 10 years ago.   Insurance coverage/medication affordability: [employer] Medicare/ Medicaid  Current diabetes medications include: lantus 38 units once daily, metformin XR 500mg  daily, Humalog 13-16 units TID after meals Current hypertension medications include: N/a Current hyperlipidemia medications include: N./a Patient states that She is taking her medications as prescribed. Patient denies adherence with medications. Patient reports that she often forgets her Humalog and she will check her blood glucose and if it is high she knows she didn't take it and will take it then.  Do you feel that your medications are working for you?  no  Have you been experiencing any side effects to the medications prescribed? no  Do you have any problems obtaining medications due to transportation or finances?  no     Patient reported dietary habits: ("I have to eat what I feed the kids") Eats 2 meals/day and 1 snacks/day Breakfast: skips Lunch: pasta or salisbury steak with bread Dinner: dinner similar to lunch Snacks: ice cream Drinks:sweet tea (2-3 cups/week), water  Patient-reported exercise habits: denies   Patient denies hypoglycemic events. Patient denies polyuria (increased urination).  Patient reports polyphagia (increased appetite).  Patient reports polydipsia (increased thirst).  Patient reports neuropathy (nerve pain). Patient reports visual changes. Patient reports self foot exams.   Home fasting blood sugars: 200-300's  2 hour post-meal/random blood sugars: 300's  Objective:   Labs:   Physical Exam Constitutional:      Appearance: She  is obese.  Neurological:     Mental Status: She is alert and oriented to person, place, and time.    Review of Systems  Gastrointestinal:  Negative for diarrhea.  Psychiatric/Behavioral:  Positive for memory loss.    Lab Results  Component Value Date   HGBA1C 9.6 (A) 05/21/2021   HGBA1C 9.6 (H) 05/04/2020   HGBA1C 8.8 (A) 11/08/2019    Lipid Panel     Component Value Date/Time   CHOL 150 11/08/2019 1344   TRIG 107 04/30/2020 1626   HDL 66 11/08/2019 1344   CHOLHDL 2.3 11/08/2019 1344   CHOLHDL 2.5 10/27/2012 1653   VLDL 30 10/27/2012 1653   LDLCALC 60 11/08/2019 1344    Clinical Atherosclerotic Cardiovascular Disease (ASCVD): Yes  The ASCVD Risk score (Arnett DK, et al., 2019) failed to calculate for the following reasons:   The patient has a prior MI or stroke diagnosis   Assessment/Plan:   T2DM is not controlled likely due to sub-optimal diet. Medication adherence appears sub-optimal. Additional pharmacotherapy is warranted. Will start Ozempic 0.25mg  once weekly. Will also increase metformin from one tablet to two tablets daily. Patient educated on purpose, proper use and potential adverse effects of Ozempic.  Following instruction patient verbalized understanding of treatment plan.     Continued basal insulin lantus 38 units once daily Continued Humalog 13-16 units TID but reminded patient to take BEFORE meal Started GLP-1 Ozempic 0.25mg  once weekly  Increased metformin to 500mg  BID Extensively discussed pathophysiology of diabetes, dietary effects on blood sugar control, and recommended lifestyle interventions Patient will adhere to dietary modifications Counseled on s/sx of and management of hypoglycemia Will send in prescription to  DME supplier to obtain CGM Next A1C anticipated January 2023.   Follow-up appointment one month to review sugar readings. Written patient instructions provided.  This appointment required 30 minutes of direct patient care.  Thank  you for involving pharmacy to assist in providing this patient's care.  The patient is injecting insulin 4 times a day and checking blood glucose 3-4 times a day. The patient is making adjustments to their diabetes regimen based on glucose readings.

## 2021-06-19 ENCOUNTER — Encounter: Payer: Self-pay | Admitting: Oncology

## 2021-06-19 ENCOUNTER — Other Ambulatory Visit (HOSPITAL_COMMUNITY): Payer: Self-pay

## 2021-06-19 MED ORDER — SEMAGLUTIDE(0.25 OR 0.5MG/DOS) 2 MG/1.5ML ~~LOC~~ SOPN
0.2500 mg | PEN_INJECTOR | SUBCUTANEOUS | 1 refills | Status: DC
  Filled 2021-06-19: qty 1.5, 56d supply, fill #0

## 2021-06-19 MED ORDER — METFORMIN HCL ER 500 MG PO TB24
500.0000 mg | ORAL_TABLET | Freq: Two times a day (BID) | ORAL | 0 refills | Status: DC
  Filled 2021-06-19: qty 180, 90d supply, fill #0

## 2021-06-19 NOTE — Assessment & Plan Note (Addendum)
T2DM is not controlled likely due to sub-optimal diet. Medication adherence appears sub-optimal. Additional pharmacotherapy is warranted. Will start Ozempic 0.25mg  once weekly. Will also increase metformin from one tablet to two tablets daily. Patient educated on purpose, proper use and potential adverse effects of Ozempic.  Following instruction patient verbalized understanding of treatment plan.    1. Continued basal insulin lantus 38 units once daily 2. Continued Humalog 13-16 units TID but reminded patient to take BEFORE meal 3. Started GLP-1 Ozempic 0.25mg  once weekly  4. Increased metformin to 500mg  BID 5. Extensively discussed pathophysiology of diabetes, dietary effects on blood sugar control, and recommended lifestyle interventions 6. Patient will adhere to dietary modifications 7. Counseled on s/sx of and management of hypoglycemia 8. Will send in prescription to DME supplier to obtain CGM 9. Next A1C anticipated January 2023.   Follow-up appointment one month to review sugar readings. Written patient instructions provided.

## 2021-06-24 ENCOUNTER — Other Ambulatory Visit (HOSPITAL_COMMUNITY): Payer: Self-pay

## 2021-06-26 ENCOUNTER — Other Ambulatory Visit: Payer: Self-pay | Admitting: *Deleted

## 2021-06-26 ENCOUNTER — Other Ambulatory Visit: Payer: Self-pay

## 2021-06-26 DIAGNOSIS — Z170 Estrogen receptor positive status [ER+]: Secondary | ICD-10-CM

## 2021-06-26 DIAGNOSIS — C50212 Malignant neoplasm of upper-inner quadrant of left female breast: Secondary | ICD-10-CM
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
## 2021-06-27 ENCOUNTER — Inpatient Hospital Stay (HOSPITAL_BASED_OUTPATIENT_CLINIC_OR_DEPARTMENT_OTHER): Payer: Medicare Other | Admitting: Oncology

## 2021-06-27 ENCOUNTER — Inpatient Hospital Stay: Payer: Medicare Other

## 2021-06-27 ENCOUNTER — Encounter: Payer: Self-pay | Admitting: Oncology

## 2021-06-27 ENCOUNTER — Inpatient Hospital Stay: Payer: Medicare Other | Attending: Oncology

## 2021-06-27 DIAGNOSIS — C50212 Malignant neoplasm of upper-inner quadrant of left female breast: Secondary | ICD-10-CM | POA: Insufficient documentation

## 2021-06-27 DIAGNOSIS — Z170 Estrogen receptor positive status [ER+]: Secondary | ICD-10-CM

## 2021-06-27 DIAGNOSIS — C7800 Secondary malignant neoplasm of unspecified lung: Secondary | ICD-10-CM

## 2021-06-27 DIAGNOSIS — Z5112 Encounter for antineoplastic immunotherapy: Secondary | ICD-10-CM | POA: Insufficient documentation

## 2021-06-27 DIAGNOSIS — E1149 Type 2 diabetes mellitus with other diabetic neurological complication: Secondary | ICD-10-CM | POA: Diagnosis not present

## 2021-06-27 DIAGNOSIS — C50919 Malignant neoplasm of unspecified site of unspecified female breast: Secondary | ICD-10-CM

## 2021-07-01 ENCOUNTER — Telehealth: Payer: Self-pay

## 2021-07-01 NOTE — Telephone Encounter (Signed)
Pt called to r/s appts recently missed. Pt states she did not remember she had an appt and states it was her dementia getting the best of her. Pt asks for all appts to be given to her daughter, [name] going forward. Pt knows to expect call from scheduling.

## 2021-07-04 ENCOUNTER — Inpatient Hospital Stay: Payer: Medicare Other

## 2021-07-04 ENCOUNTER — Other Ambulatory Visit: Payer: Self-pay

## 2021-07-04 ENCOUNTER — Telehealth: Payer: Self-pay | Admitting: *Deleted

## 2021-07-04 ENCOUNTER — Inpatient Hospital Stay (HOSPITAL_BASED_OUTPATIENT_CLINIC_OR_DEPARTMENT_OTHER): Payer: Medicare Other | Admitting: Oncology

## 2021-07-04 VITALS — BP 173/83 | HR 70 | Temp 97.9°F | Resp 20 | Ht 64.0 in | Wt 319.6 lb

## 2021-07-04 DIAGNOSIS — C50919 Malignant neoplasm of unspecified site of unspecified female breast: Secondary | ICD-10-CM | POA: Diagnosis not present

## 2021-07-04 DIAGNOSIS — Z170 Estrogen receptor positive status [ER+]: Secondary | ICD-10-CM

## 2021-07-04 DIAGNOSIS — C50212 Malignant neoplasm of upper-inner quadrant of left female breast: Secondary | ICD-10-CM

## 2021-07-04 DIAGNOSIS — E1149 Type 2 diabetes mellitus with other diabetic neurological complication: Secondary | ICD-10-CM

## 2021-07-04 DIAGNOSIS — Z5112 Encounter for antineoplastic immunotherapy: Secondary | ICD-10-CM | POA: Diagnosis present

## 2021-07-04 DIAGNOSIS — C7800 Secondary malignant neoplasm of unspecified lung: Secondary | ICD-10-CM | POA: Diagnosis present

## 2021-07-04 LAB — CMP (CANCER CENTER ONLY)
ALT: 98 U/L — ABNORMAL HIGH (ref 0–44)
AST: 75 U/L — ABNORMAL HIGH (ref 15–41)
Albumin: 3.3 g/dL — ABNORMAL LOW (ref 3.5–5.0)
Alkaline Phosphatase: 111 U/L (ref 38–126)
Anion gap: 9 (ref 5–15)
BUN: 14 mg/dL (ref 8–23)
CO2: 29 mmol/L (ref 22–32)
Calcium: 9.3 mg/dL (ref 8.9–10.3)
Chloride: 102 mmol/L (ref 98–111)
Creatinine: 0.74 mg/dL (ref 0.44–1.00)
GFR, Estimated: >60 mL/min (ref >60)
Glucose, Bld: 149 mg/dL — ABNORMAL HIGH (ref 70–99)
Potassium: 4.2 mmol/L (ref 3.5–5.1)
Sodium: 140 mmol/L (ref 135–145)
Total Bilirubin: 0.4 mg/dL (ref 0.3–1.2)
Total Protein: 7.4 g/dL (ref 6.5–8.1)

## 2021-07-04 LAB — CBC WITH DIFFERENTIAL (CANCER CENTER ONLY)
Abs Immature Granulocytes: 0.03 10*3/uL (ref 0.00–0.07)
Basophils Absolute: 0 10*3/uL (ref 0.0–0.1)
Basophils Relative: 0 %
Eosinophils Absolute: 0.1 10*3/uL (ref 0.0–0.5)
Eosinophils Relative: 1 %
HCT: 44.5 % (ref 36.0–46.0)
Hemoglobin: 14.8 g/dL (ref 12.0–15.0)
Immature Granulocytes: 0 %
Lymphocytes Relative: 36 %
Lymphs Abs: 2.4 10*3/uL (ref 0.7–4.0)
MCH: 28.8 pg (ref 26.0–34.0)
MCHC: 33.3 g/dL (ref 30.0–36.0)
MCV: 86.6 fL (ref 80.0–100.0)
Monocytes Absolute: 0.5 10*3/uL (ref 0.1–1.0)
Monocytes Relative: 8 %
Neutro Abs: 3.6 10*3/uL (ref 1.7–7.7)
Neutrophils Relative %: 55 %
Platelet Count: 225 10*3/uL (ref 150–400)
RBC: 5.14 MIL/uL — ABNORMAL HIGH (ref 3.87–5.11)
RDW: 13.5 % (ref 11.5–15.5)
WBC Count: 6.7 10*3/uL (ref 4.0–10.5)
nRBC: 0 % (ref 0.0–0.2)

## 2021-07-04 MED ORDER — LORAZEPAM 2 MG/ML IJ SOLN
0.5000 mg | Freq: Once | INTRAMUSCULAR | Status: AC
  Administered 2021-07-04: 15:00:00 0.5 mg via INTRAVENOUS
  Filled 2021-07-04: qty 1

## 2021-07-04 MED ORDER — TRASTUZUMAB-ANNS CHEMO 150 MG IV SOLR
750.0000 mg | Freq: Once | INTRAVENOUS | Status: AC
  Administered 2021-07-04: 15:00:00 750 mg via INTRAVENOUS
  Filled 2021-07-04: qty 35.72

## 2021-07-04 MED ORDER — DIPHENHYDRAMINE HCL 25 MG PO CAPS
25.0000 mg | ORAL_CAPSULE | Freq: Once | ORAL | Status: AC
  Administered 2021-07-04: 15:00:00 25 mg via ORAL
  Filled 2021-07-04: qty 1

## 2021-07-04 MED ORDER — SODIUM CHLORIDE 0.9% FLUSH
10.0000 mL | Freq: Once | INTRAVENOUS | Status: AC
  Administered 2021-07-04: 16:00:00 10 mL via INTRAVENOUS

## 2021-07-04 MED ORDER — ACETAMINOPHEN 325 MG PO TABS
650.0000 mg | ORAL_TABLET | Freq: Once | ORAL | Status: AC
  Administered 2021-07-04: 15:00:00 650 mg via ORAL
  Filled 2021-07-04: qty 2

## 2021-07-04 MED ORDER — HEPARIN SOD (PORK) LOCK FLUSH 100 UNIT/ML IV SOLN
500.0000 [IU] | Freq: Once | INTRAVENOUS | Status: AC | PRN
  Administered 2021-07-04: 16:00:00 500 [IU]

## 2021-07-04 MED ORDER — SODIUM CHLORIDE 0.9 % IV SOLN: Freq: Once | INTRAVENOUS | Status: AC

## 2021-07-04 NOTE — Telephone Encounter (Signed)
Per MD may proceed with treatment today- ECHO in process of being scheduled before next treatment and ok to proceed with noted elevated AST and ALT.
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
## 2021-07-04 NOTE — Patient Instructions (Signed)
[facility] MEDICAL [facility]COLOG[facility]   ?Discharge Instructions: ?Thank you for choosing [facility] to provide your oncology and hematology care.  ? ?If you have a lab appointment with the Cancer Center, please go directly to the Cancer Center and check in at the registration area. ?  ?Wear comfortable clothing and clothing appropriate for easy access to any Portacath or PICC line.  ? ?We strive to give you quality time with your provider. You may need to reschedule your appointment if you arrive late (15 or more minutes).  Arriving late affects you and other patients whose appointments are after yours.  Also, if you miss three or more appointments without notifying the office, you may be dismissed from the clinic at the provider?s discretion.    ?  ?For prescription refill requests, have your pharmacy contact our office and allow 72 hours for refills to be completed.   ? ?Today you received the following chemotherapy and/or immunotherapy agents: trastuzumab-anns    ?  ?To help prevent nausea and vomiting after your treatment, we encourage you to take your nausea medication as directed. ? ?BELOW ARE SYMPTOMS THAT SHOULD BE REPORTED IMMEDIATELY: ?*FEVER GREATER THAN 100.4 F (38 ?C) OR HIGHER ?*CHILLS OR SWEATING ?*NAUSEA AND VOMITING THAT IS NOT CONTROLLED WITH YOUR NAUSEA MEDICATION ?*UNUSUAL SHORTNESS OF BREATH ?*UNUSUAL BRUISING OR BLEEDING ?*URINARY PROBLEMS (pain or burning when urinating, or frequent urination) ?*BOWEL PROBLEMS (unusual diarrhea, constipation, pain near the anus) ?TENDERNESS IN MOUTH AND THROAT WITH OR WITHOUT PRESENCE OF ULCERS (sore throat, sores in mouth, or a toothache) ?UNUSUAL RASH, SWELLING OR PAIN  ?UNUSUAL VAGINAL DISCHARGE OR ITCHING  ? ?Items with * indicate a potential emergency and should be followed up as soon as possible or go to the Emergency Department if any problems should occur. ? ?Please show the CHEMOTHERAPY ALERT CARD or IMMUNOTHERAPY ALERT CARD at  check-in to the Emergency Department and triage nurse. ? ?Should you have questions after your visit or need to cancel or reschedule your appointment, please contact [facility]COL[facility]  Dept: [phone]  and follow the prompts.  Office hours are 8:00 a.m. to 4:30 p.m. Monday - Friday. Please note that voicemails left after 4:00 p.m. may not be returned until the following business day.  We are closed weekends and major holidays. You have access to a nurse at all times for urgent questions. Please call the main number to the clinic Dept: [phone] and follow the prompts. ? ? ?For any non-urgent questions, you may also contact your provider using MyChart. We now offer e-Visits for anyone 18 and older to request care online for non-urgent symptoms. For details visit [email or web address]. ?  ?Also download the MyChart app! Go to the app store, search "MyChart", open the app, select , and log in with your MyChart username and password. ? ?Due to Covid, a mask is required upon entering the hospital/clinic. If you do not have a mask, one will be given to you upon arrival. For doctor visits, patients may have 1 support person aged 18 or older with them. For treatment visits, patients cannot have anyone with them due to current Covid guidelines and our immunocompromised population.  ? ?

## 2021-07-19 ENCOUNTER — Other Ambulatory Visit: Payer: Self-pay | Admitting: Family Medicine

## 2021-07-19 ENCOUNTER — Telehealth: Payer: Self-pay

## 2021-07-19 MED ORDER — ALBUTEROL SULFATE HFA 108 (90 BASE) MCG/ACT IN AERS: INHALATION_SPRAY | RESPIRATORY_TRACT | 1 refills | Status: DC

## 2021-07-19 NOTE — Telephone Encounter (Signed)
Patient calls nurse line requesting refill on albuterol inhaler. Reports that she has been having exacerbation of asthma over the last two days.   Patient seems out of breath during conversation. Advised that due to patient's inability to speak in complete sentences that she should be evaluated. Patient states that she will not be going to the doctor until scheduled appointment with Dr. Erin Hearing on 12/7.  Strongly advised evaluation due to breathing concern. Patient reports that she has laryngitis that is causing her to sound worse than she is.   Will forward message to Dr. Erin Hearing and speak with him directly.   Talbot Grumbling, RN

## 2021-07-19 NOTE — Telephone Encounter (Signed)
Mostly hoarse breathing ok but feels the albuterol helps Agrees to go to ER if worseing  Will see next [facility]

## 2021-07-24 ENCOUNTER — Other Ambulatory Visit: Payer: Self-pay

## 2021-07-24 ENCOUNTER — Encounter: Payer: Self-pay | Admitting: Family Medicine

## 2021-07-24 ENCOUNTER — Ambulatory Visit (INDEPENDENT_AMBULATORY_CARE_PROVIDER_SITE_OTHER): Payer: Medicare Other | Admitting: Family Medicine

## 2021-07-24 DIAGNOSIS — G4739 Other sleep apnea: Secondary | ICD-10-CM

## 2021-07-24 DIAGNOSIS — J989 Respiratory disorder, unspecified: Secondary | ICD-10-CM | POA: Insufficient documentation

## 2021-07-24 DIAGNOSIS — E1149 Type 2 diabetes mellitus with other diabetic neurological complication: Secondary | ICD-10-CM

## 2021-07-24 MED ORDER — AZITHROMYCIN 250 MG PO TABS: ORAL_TABLET | ORAL | 0 refills | Status: DC

## 2021-07-24 MED ORDER — SEMAGLUTIDE(0.25 OR 0.5MG/DOS) 2 MG/1.5ML ~~LOC~~ SOPN: 0.5000 mg | PEN_INJECTOR | SUBCUTANEOUS | 0 refills | Status: DC

## 2021-07-24 NOTE — Assessment & Plan Note (Signed)
Improving by her home readings. Follow up with [name]

## 2021-07-24 NOTE — Assessment & Plan Note (Signed)
Likely bronchitis with perhaps bacterial secondary infection.  Given her generally poor health will treat with course of antibiotics.  Not wheezing and she has difficult to control diabetes so will not use steroids. Need to see if can get her to use CPAP

## 2021-07-24 NOTE — Progress Notes (Signed)
    SUBJECTIVE:   CHIEF COMPLAINT / HPI:   Bronchitis Has been ill with cough and now sputum for about a week.  Thinks having mild fever.  Using her O2 as usual except increasing to 8 liters at night.  Had lost her voice but is better now  Diabetes Brings in her meter and note for [place].  Her four times a day blood sugar are all less than 240s for last week.  She is happy about her progress  PERTINENT  PMH / PSH: had CPAP but did not tolerate it and sent back - willing to try again  OBJECTIVE:   BP (!) 169/67   Pulse 69   Ht 5\' 4"  (1.626 m)   SpO2 99%   BMI 54.86 kg/m   Alert nad  Talking in full sentences Pulse ox noted Lungs - decreased breath sounds at base without wheeze Heart - Regular rate and rhythm.  No murmurs, gallops or rubs.    No unusual edema   ASSESSMENT/PLAN:   Respiratory illness Likely bronchitis with perhaps bacterial secondary infection.  Given her generally poor health will treat with course of antibiotics.  Not wheezing and she has difficult to control diabetes so will not use steroids. Need to see if can get her to use CPAP  Diabetes mellitus type 2 with neurological manifestations ([place]) Improving by her home readings. Follow up with Ms Georgina Peer   Sleep apnea Saw Dr Elsworth [facility]oho pulmonary in 2017.  Likely will need to refer back to him if she is interested.     [name], MD [facility]

## 2021-07-24 NOTE — Patient Instructions (Addendum)
Good to see you today - Thank you for coming in  Things we discussed today:  Bronchitis Take all the azithromycin Let me know if you are not getting better  Ask your daughter about the alternative to gabapentin And the name of the nebulizer medication you need And My Chart me  I will look into getting back Cpap  Do not use more than 4 liters when sleeping   Please always bring your medication bottles  Come back to see me in 2 months

## 2021-07-24 NOTE — Addendum Note (Signed)
Addended by: Talbert Cage L on: 07/24/2021 02:32 PM   Modules accepted: Orders

## 2021-07-24 NOTE — Assessment & Plan Note (Signed)
Saw Dr Elsworth Soho pulmonary in 2017.  Likely will need to refer back to him if she is interested.

## 2021-07-26 MED ORDER — PREGABALIN 25 MG PO CAPS: 25.0000 mg | ORAL_CAPSULE | Freq: Two times a day (BID) | ORAL | 1 refills | Status: DC

## 2021-07-29 ENCOUNTER — Other Ambulatory Visit: Payer: Self-pay | Admitting: Oncology

## 2021-07-29 ENCOUNTER — Other Ambulatory Visit: Payer: Self-pay | Admitting: Family Medicine

## 2021-08-01 ENCOUNTER — Inpatient Hospital Stay: Payer: Medicare Other

## 2021-08-01 ENCOUNTER — Telehealth: Payer: Self-pay | Admitting: *Deleted

## 2021-08-01 ENCOUNTER — Other Ambulatory Visit: Payer: Self-pay

## 2021-08-01 ENCOUNTER — Inpatient Hospital Stay: Payer: Medicare Other | Attending: Oncology

## 2021-08-01 ENCOUNTER — Ambulatory Visit (HOSPITAL_COMMUNITY)
Admission: RE | Admit: 2021-08-01 | Discharge: 2021-08-01 | Disposition: A | Discharge status: Home / Self Care | Payer: Medicare Other | Source: Ambulatory Visit | Attending: Oncology | Admitting: Oncology

## 2021-08-01 VITALS — BP 172/72 | HR 69 | Temp 98.6°F | Resp 20

## 2021-08-01 DIAGNOSIS — C7801 Secondary malignant neoplasm of right lung: Secondary | ICD-10-CM | POA: Insufficient documentation

## 2021-08-01 DIAGNOSIS — J449 Chronic obstructive pulmonary disease, unspecified: Secondary | ICD-10-CM | POA: Insufficient documentation

## 2021-08-01 DIAGNOSIS — Z170 Estrogen receptor positive status [ER+]: Secondary | ICD-10-CM | POA: Diagnosis not present

## 2021-08-01 DIAGNOSIS — C50919 Malignant neoplasm of unspecified site of unspecified female breast: Secondary | ICD-10-CM | POA: Diagnosis not present

## 2021-08-01 DIAGNOSIS — C50212 Malignant neoplasm of upper-inner quadrant of left female breast: Secondary | ICD-10-CM | POA: Diagnosis present

## 2021-08-01 DIAGNOSIS — Z0189 Encounter for other specified special examinations: Secondary | ICD-10-CM

## 2021-08-01 DIAGNOSIS — C7800 Secondary malignant neoplasm of unspecified lung: Secondary | ICD-10-CM | POA: Insufficient documentation

## 2021-08-01 DIAGNOSIS — Z01818 Encounter for other preprocedural examination: Secondary | ICD-10-CM | POA: Diagnosis not present

## 2021-08-01 DIAGNOSIS — Z171 Estrogen receptor negative status [ER-]: Secondary | ICD-10-CM | POA: Insufficient documentation

## 2021-08-01 DIAGNOSIS — G4730 Sleep apnea, unspecified: Secondary | ICD-10-CM | POA: Diagnosis not present

## 2021-08-01 DIAGNOSIS — Z95828 Presence of other vascular implants and grafts: Secondary | ICD-10-CM

## 2021-08-01 DIAGNOSIS — E119 Type 2 diabetes mellitus without complications: Secondary | ICD-10-CM | POA: Insufficient documentation

## 2021-08-01 DIAGNOSIS — I10 Essential (primary) hypertension: Secondary | ICD-10-CM | POA: Insufficient documentation

## 2021-08-01 DIAGNOSIS — Z5112 Encounter for antineoplastic immunotherapy: Secondary | ICD-10-CM | POA: Insufficient documentation

## 2021-08-01 DIAGNOSIS — C7802 Secondary malignant neoplasm of left lung: Secondary | ICD-10-CM | POA: Insufficient documentation

## 2021-08-01 LAB — CBC WITH DIFFERENTIAL (CANCER CENTER ONLY)
Abs Immature Granulocytes: 0.04 10*3/uL (ref 0.00–0.07)
Basophils Absolute: 0 10*3/uL (ref 0.0–0.1)
Basophils Relative: 0 %
Eosinophils Absolute: 0.1 10*3/uL (ref 0.0–0.5)
Eosinophils Relative: 1 %
HCT: 41.8 % (ref 36.0–46.0)
Hemoglobin: 13.8 g/dL (ref 12.0–15.0)
Immature Granulocytes: 1 %
Lymphocytes Relative: 32 %
Lymphs Abs: 2.3 10*3/uL (ref 0.7–4.0)
MCH: 29.1 pg (ref 26.0–34.0)
MCHC: 33 g/dL (ref 30.0–36.0)
MCV: 88 fL (ref 80.0–100.0)
Monocytes Absolute: 0.5 10*3/uL (ref 0.1–1.0)
Monocytes Relative: 6 %
Neutro Abs: 4.4 10*3/uL (ref 1.7–7.7)
Neutrophils Relative %: 60 %
Platelet Count: 209 10*3/uL (ref 150–400)
RBC: 4.75 MIL/uL (ref 3.87–5.11)
RDW: 14 % (ref 11.5–15.5)
WBC Count: 7.3 10*3/uL (ref 4.0–10.5)
nRBC: 0 % (ref 0.0–0.2)

## 2021-08-01 LAB — ECHOCARDIOGRAM COMPLETE
Area-P 1/2: 3.91 cm2
S' Lateral: 2.7 cm

## 2021-08-01 LAB — CMP (CANCER CENTER ONLY)
ALT: 85 U/L — ABNORMAL HIGH (ref 0–44)
AST: 57 U/L — ABNORMAL HIGH (ref 15–41)
Albumin: 3.1 g/dL — ABNORMAL LOW (ref 3.5–5.0)
Alkaline Phosphatase: 97 U/L (ref 38–126)
Anion gap: 7 (ref 5–15)
BUN: 15 mg/dL (ref 8–23)
CO2: 31 mmol/L (ref 22–32)
Calcium: 9.3 mg/dL (ref 8.9–10.3)
Chloride: 103 mmol/L (ref 98–111)
Creatinine: 0.71 mg/dL (ref 0.44–1.00)
GFR, Estimated: >60 mL/min (ref >60)
Glucose, Bld: 173 mg/dL — ABNORMAL HIGH (ref 70–99)
Potassium: 4 mmol/L (ref 3.5–5.1)
Sodium: 141 mmol/L (ref 135–145)
Total Bilirubin: 0.4 mg/dL (ref 0.3–1.2)
Total Protein: 7.1 g/dL (ref 6.5–8.1)

## 2021-08-01 MED ORDER — LORAZEPAM 2 MG/ML IJ SOLN
0.5000 mg | Freq: Once | INTRAMUSCULAR | Status: AC
  Administered 2021-08-01: 0.5 mg via INTRAVENOUS
  Filled 2021-08-01: qty 1

## 2021-08-01 MED ORDER — HEPARIN SOD (PORK) LOCK FLUSH 100 UNIT/ML IV SOLN
500.0000 [IU] | Freq: Once | INTRAVENOUS | Status: AC | PRN
  Administered 2021-08-01: 500 [IU]

## 2021-08-01 MED ORDER — SODIUM CHLORIDE 0.9 % IV SOLN: Freq: Once | INTRAVENOUS | Status: AC

## 2021-08-01 MED ORDER — DIPHENHYDRAMINE HCL 25 MG PO CAPS
25.0000 mg | ORAL_CAPSULE | Freq: Once | ORAL | Status: AC
  Administered 2021-08-01: 25 mg via ORAL
  Filled 2021-08-01: qty 1

## 2021-08-01 MED ORDER — PERFLUTREN LIPID MICROSPHERE
1.0000 mL | INTRAVENOUS | Status: AC | PRN
  Administered 2021-08-01: 2 mL via INTRAVENOUS
  Filled 2021-08-01: qty 10

## 2021-08-01 MED ORDER — ACETAMINOPHEN 325 MG PO TABS
650.0000 mg | ORAL_TABLET | Freq: Once | ORAL | Status: AC
  Administered 2021-08-01: 650 mg via ORAL
  Filled 2021-08-01: qty 2

## 2021-08-01 MED ORDER — SODIUM CHLORIDE 0.9% FLUSH
10.0000 mL | Freq: Once | INTRAVENOUS | Status: AC
Start: 2021-08-01 — End: 2021-08-01
  Administered 2021-08-01: 10 mL

## 2021-08-01 MED ORDER — SODIUM CHLORIDE 0.9% FLUSH
10.0000 mL | Freq: Once | INTRAVENOUS | Status: AC
  Administered 2021-08-01: 10 mL via INTRAVENOUS

## 2021-08-01 MED ORDER — TRASTUZUMAB-ANNS CHEMO 150 MG IV SOLR
750.0000 mg | Freq: Once | INTRAVENOUS | Status: AC
  Administered 2021-08-01: 750 mg via INTRAVENOUS
  Filled 2021-08-01: qty 35.72

## 2021-08-01 NOTE — Telephone Encounter (Signed)
May proceed with trastuzumab without results of ECHO being performed today per MD.

## 2021-08-01 NOTE — Progress Notes (Incomplete)
°  Echocardiogram 2D Echocardiogram has been performed.  Fidel Levy 08/01/2021, 4:04 PM

## 2021-08-01 NOTE — Patient Instructions (Signed)
[facility] ONCOLOG[facility]  Discharge Instructions: Thank you for choosing [facility] to provide your oncology and hematology care.   If you have a lab appointment with the [facility], please go directly to the [facility] and check in at the registration area.   Wear comfortable clothing and clothing appropriate for easy access to any Portacath or PICC line.   We strive to give you quality time with your provider. You may need to reschedule your appointment if you arrive late (15 or more minutes).  Arriving late affects you and other patients whose appointments are after yours.  Also, if you miss three or more appointments without notifying the office, you may be dismissed from the clinic at the providers discretion.      For prescription refill requests, have your pharmacy contact our office and allow 72 hours for refills to be completed.    Today you received the following chemotherapy and/or immunotherapy agent: Trastuzumab ([place]).   To help prevent nausea and vomiting after your treatment, we encourage you to take your nausea medication as directed.  BELOW ARE SYMPTOMS THAT SHOULD BE REPORTED IMMEDIATELY: *FEVER GREATER THAN 100.4 F (38 C) OR HIGHER *CHILLS OR SWEATING *NAUSEA AND VOMITING THAT IS NOT CONTROLLED WITH YOUR NAUSEA MEDICATION *UNUSUAL SHORTNESS OF BREATH *UNUSUAL BRUISING OR BLEEDING *URINARY PROBLEMS (pain or burning when urinating, or frequent urination) *BOWEL PROBLEMS (unusual diarrhea, constipation, pain near the anus) TENDERNESS IN MOUTH AND THROAT WITH OR WITHOUT PRESENCE OF ULCERS (sore throat, sores in mouth, or a toothache) UNUSUAL RASH, SWELLING OR PAIN  UNUSUAL VAGINAL DISCHARGE OR ITCHING   Items with * indicate a potential emergency and should be followed up as soon as possible or go to the Emergency Department if any problems should occur.  Please show the CHEMOTHERAPY ALERT CARD or IMMUNOTHERAPY ALERT CARD at  check-in to the Emergency Department and triage nurse.  Should you have questions after your visit or need to cancel or reschedule your appointment, please contact [facility]  Dept: [phone]  and follow the prompts.  Office hours are 8:00 a.m. to 4:30 p.m. Monday - Friday. Please note that voicemails left after 4:00 p.m. may not be returned until the following business day.  We are closed weekends and major holidays. You have access to a nurse at all times for urgent questions. Please call the main number to the clinic Dept: [phone] and follow the prompts.   For any non-urgent questions, you may also contact your provider using MyChart. We now offer e-Visits for anyone 36 and older to request care online for non-urgent symptoms. For details visit [email or web address].   Also download the MyChart app! Go to the app store, search "MyChart", open the app, select [facility], and log in with your MyChart username and password.  Due to Covid, a mask is required upon entering the hospital/clinic. If you do not have a mask, one will be given to you upon arrival. For doctor visits, patients may have 1 support person aged 71 or older with them. For treatment visits, patients cannot have anyone with them due to current Covid guidelines and our immunocompromised population.

## 2021-08-07 ENCOUNTER — Telehealth: Payer: Self-pay

## 2021-08-07 NOTE — Telephone Encounter (Signed)
Patient calls nurse line requesting pen needles for insulin. Patient reports she has been using ozempic needles, however they are too short for 36 units and "do not do well." I do not see any needles on patients medication list.   Please advise.

## 2021-08-09 ENCOUNTER — Encounter: Payer: Self-pay | Admitting: Family Medicine

## 2021-08-09 DIAGNOSIS — E1149 Type 2 diabetes mellitus with other diabetic neurological complication: Secondary | ICD-10-CM

## 2021-08-10 MED ORDER — INSULIN PEN NEEDLE 29G X 8MM MISC: 0 refills | Status: DC

## 2021-08-10 NOTE — Telephone Encounter (Signed)
Rx for 8 mm 29 gauge sent after MyChart message.   Dorris Singh, MD  Family Medicine Teaching Service

## 2021-08-14 MED ORDER — PEN NEEDLES 30G X 8 MM MISC: 3 refills | Status: DC

## 2021-08-14 NOTE — Telephone Encounter (Signed)
Called pharmacy. They do not stock insulin pen needles 29g x 88 mm. They only have 30g x 8 mm.   Will resend for 30g x 8 mm.   Talbot Grumbling, RN

## 2021-08-14 NOTE — Addendum Note (Signed)
Addended by: Talbot Grumbling on: 08/14/2021 09:05 AM   Modules accepted: Orders

## 2021-08-19 ENCOUNTER — Other Ambulatory Visit: Payer: Self-pay | Admitting: Family Medicine

## 2021-08-22 ENCOUNTER — Encounter: Payer: Self-pay | Admitting: Oncology

## 2021-08-26 DIAGNOSIS — E1149 Type 2 diabetes mellitus with other diabetic neurological complication: Secondary | ICD-10-CM | POA: Diagnosis not present

## 2021-08-29 ENCOUNTER — Encounter: Payer: Self-pay | Admitting: Hematology and Oncology

## 2021-08-29 ENCOUNTER — Other Ambulatory Visit: Payer: Self-pay

## 2021-08-29 ENCOUNTER — Encounter: Payer: Self-pay | Admitting: Oncology

## 2021-08-29 ENCOUNTER — Inpatient Hospital Stay (HOSPITAL_BASED_OUTPATIENT_CLINIC_OR_DEPARTMENT_OTHER): Payer: Medicare Other | Admitting: Hematology and Oncology

## 2021-08-29 ENCOUNTER — Inpatient Hospital Stay: Payer: Medicare Other | Attending: Oncology

## 2021-08-29 ENCOUNTER — Inpatient Hospital Stay: Payer: Medicare Other

## 2021-08-29 VITALS — BP 174/75 | HR 76 | Temp 97.7°F | Resp 18 | Ht 64.0 in | Wt 316.0 lb

## 2021-08-29 DIAGNOSIS — C50212 Malignant neoplasm of upper-inner quadrant of left female breast: Secondary | ICD-10-CM | POA: Diagnosis not present

## 2021-08-29 DIAGNOSIS — Z5112 Encounter for antineoplastic immunotherapy: Secondary | ICD-10-CM | POA: Insufficient documentation

## 2021-08-29 DIAGNOSIS — C7800 Secondary malignant neoplasm of unspecified lung: Secondary | ICD-10-CM

## 2021-08-29 DIAGNOSIS — Z170 Estrogen receptor positive status [ER+]: Secondary | ICD-10-CM | POA: Diagnosis not present

## 2021-08-29 DIAGNOSIS — C50919 Malignant neoplasm of unspecified site of unspecified female breast: Secondary | ICD-10-CM | POA: Diagnosis not present

## 2021-08-29 DIAGNOSIS — Z95828 Presence of other vascular implants and grafts: Secondary | ICD-10-CM

## 2021-08-29 LAB — CMP (CANCER CENTER ONLY)
ALT: 72 U/L — ABNORMAL HIGH (ref 0–44)
AST: 49 U/L — ABNORMAL HIGH (ref 15–41)
Albumin: 3.6 g/dL (ref 3.5–5.0)
Alkaline Phosphatase: 101 U/L (ref 38–126)
Anion gap: 5 (ref 5–15)
BUN: 12 mg/dL (ref 8–23)
CO2: 32 mmol/L (ref 22–32)
Calcium: 9.1 mg/dL (ref 8.9–10.3)
Chloride: 103 mmol/L (ref 98–111)
Creatinine: 0.61 mg/dL (ref 0.44–1.00)
GFR, Estimated: >60 mL/min (ref >60)
Glucose, Bld: 143 mg/dL — ABNORMAL HIGH (ref 70–99)
Potassium: 4.3 mmol/L (ref 3.5–5.1)
Sodium: 140 mmol/L (ref 135–145)
Total Bilirubin: 0.3 mg/dL (ref 0.3–1.2)
Total Protein: 7.2 g/dL (ref 6.5–8.1)

## 2021-08-29 LAB — CBC WITH DIFFERENTIAL (CANCER CENTER ONLY)
Abs Immature Granulocytes: 0.04 10*3/uL (ref 0.00–0.07)
Basophils Absolute: 0 10*3/uL (ref 0.0–0.1)
Basophils Relative: 1 %
Eosinophils Absolute: 0.1 10*3/uL (ref 0.0–0.5)
Eosinophils Relative: 1 %
HCT: 40.6 % (ref 36.0–46.0)
Hemoglobin: 13.9 g/dL (ref 12.0–15.0)
Immature Granulocytes: 1 %
Lymphocytes Relative: 34 %
Lymphs Abs: 2.3 10*3/uL (ref 0.7–4.0)
MCH: 29.8 pg (ref 26.0–34.0)
MCHC: 34.2 g/dL (ref 30.0–36.0)
MCV: 86.9 fL (ref 80.0–100.0)
Monocytes Absolute: 0.4 10*3/uL (ref 0.1–1.0)
Monocytes Relative: 7 %
Neutro Abs: 3.8 10*3/uL (ref 1.7–7.7)
Neutrophils Relative %: 56 %
Platelet Count: 226 10*3/uL (ref 150–400)
RBC: 4.67 MIL/uL (ref 3.87–5.11)
RDW: 14.6 % (ref 11.5–15.5)
WBC Count: 6.7 10*3/uL (ref 4.0–10.5)
nRBC: 0 % (ref 0.0–0.2)

## 2021-08-29 MED ORDER — DIPHENHYDRAMINE HCL 25 MG PO CAPS
25.0000 mg | ORAL_CAPSULE | Freq: Once | ORAL | Status: AC
  Administered 2021-08-29: 25 mg via ORAL
  Filled 2021-08-29: qty 1

## 2021-08-29 MED ORDER — LETROZOLE 2.5 MG PO TABS: 2.5000 mg | ORAL_TABLET | Freq: Every day | ORAL | 12 refills | Status: DC

## 2021-08-29 MED ORDER — SODIUM CHLORIDE 0.9% FLUSH
10.0000 mL | Freq: Once | INTRAVENOUS | Status: AC
  Administered 2021-08-29: 10 mL via INTRAVENOUS

## 2021-08-29 MED ORDER — TRASTUZUMAB-ANNS CHEMO 150 MG IV SOLR
750.0000 mg | Freq: Once | INTRAVENOUS | Status: AC
  Administered 2021-08-29: 750 mg via INTRAVENOUS
  Filled 2021-08-29: qty 35.72

## 2021-08-29 MED ORDER — HEPARIN SOD (PORK) LOCK FLUSH 100 UNIT/ML IV SOLN
500.0000 [IU] | Freq: Once | INTRAVENOUS | Status: AC | PRN
  Administered 2021-08-29: 500 [IU]

## 2021-08-29 MED ORDER — SODIUM CHLORIDE 0.9 % IV SOLN: Freq: Once | INTRAVENOUS | Status: AC

## 2021-08-29 MED ORDER — SODIUM CHLORIDE 0.9% FLUSH
10.0000 mL | Freq: Once | INTRAVENOUS | Status: AC
  Administered 2021-08-29: 10 mL

## 2021-08-29 MED ORDER — ACETAMINOPHEN 325 MG PO TABS
650.0000 mg | ORAL_TABLET | Freq: Once | ORAL | Status: AC
  Administered 2021-08-29: 650 mg via ORAL
  Filled 2021-08-29: qty 2

## 2021-08-29 MED ORDER — LORAZEPAM 2 MG/ML IJ SOLN
0.5000 mg | Freq: Once | INTRAMUSCULAR | Status: AC
  Administered 2021-08-29: 0.5 mg via INTRAVENOUS
  Filled 2021-08-29: qty 1

## 2021-08-29 NOTE — Patient Instructions (Signed)
[facility]  Discharge Instructions: Thank you for choosing [facility] to provide your oncology and hematology care.   If you have a lab appointment with the [facility], please go directly to the [facility] and check in at the registration area.   Wear comfortable clothing and clothing appropriate for easy access to any Portacath or PICC line.   We strive to give you quality time with your provider. You may need to reschedule your appointment if you arrive late (15 or more minutes).  Arriving late affects you and other patients whose appointments are after yours.  Also, if you miss three or more appointments without notifying the office, you may be dismissed from the clinic at the providers discretion.      For prescription refill requests, have your pharmacy contact our office and allow 72 hours for refills to be completed.    Today you received the following chemotherapy and/or immunotherapy agents: Kanjinti   To help prevent nausea and vomiting after your treatment, we encourage you to take your nausea medication as directed.  BELOW ARE SYMPTOMS THAT SHOULD BE REPORTED IMMEDIATELY: *FEVER GREATER THAN 100.4 F (38 C) OR HIGHER *CHILLS OR SWEATING *NAUSEA AND VOMITING THAT IS NOT CONTROLLED WITH YOUR NAUSEA MEDICATION *UNUSUAL SHORTNESS OF BREATH *UNUSUAL BRUISING OR BLEEDING *URINARY PROBLEMS (pain or burning when urinating, or frequent urination) *BOWEL PROBLEMS (unusual diarrhea, constipation, pain near the anus) TENDERNESS IN MOUTH AND THROAT WITH OR WITHOUT PRESENCE OF ULCERS (sore throat, sores in mouth, or a toothache) UNUSUAL RASH, SWELLING OR PAIN  UNUSUAL VAGINAL DISCHARGE OR ITCHING   Items with * indicate a potential emergency and should be followed up as soon as possible or go to the Emergency Department if any problems should occur.  Please show the CHEMOTHERAPY ALERT CARD or IMMUNOTHERAPY ALERT CARD at check-in to the  Emergency Department and triage nurse.  Should you have questions after your visit or need to cancel or reschedule your appointment, please contact [facility]  Dept: [phone]  and follow the prompts.  Office hours are 8:00 a.m. to 4:30 p.m. Monday - Friday. Please note that voicemails left after 4:00 p.m. may not be returned until the following business day.  We are closed weekends and major holidays. You have access to a nurse at all times for urgent questions. Please call the main number to the clinic Dept: [phone] and follow the prompts.   For any non-urgent questions, you may also contact your provider using MyChart. We now offer e-Visits for anyone 23 and older to request care online for non-urgent symptoms. For details visit [email or web address].   Also download the MyChart app! Go to the app store, search "MyChart", open the app, select [facility], and log in with your MyChart username and password.  Due to Covid, a mask is required upon entering the hospital/clinic. If you do not have a mask, one will be given to you upon arrival. For doctor visits, patients may have 1 support person aged 61 or older with them. For treatment visits, patients cannot have anyone with them due to current Covid guidelines and our immunocompromised population.
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
## 2021-09-04 ENCOUNTER — Ambulatory Visit (INDEPENDENT_AMBULATORY_CARE_PROVIDER_SITE_OTHER): Payer: Medicare Other | Admitting: Pharmacist

## 2021-09-04 ENCOUNTER — Other Ambulatory Visit: Payer: Self-pay

## 2021-09-04 DIAGNOSIS — E1149 Type 2 diabetes mellitus with other diabetic neurological complication: Secondary | ICD-10-CM | POA: Diagnosis not present

## 2021-09-04 LAB — POCT GLYCOSYLATED HEMOGLOBIN (HGB A1C): HbA1c, POC (controlled diabetic range): 6.7 % (ref 0.0–7.0)

## 2021-09-04 NOTE — Patient Instructions (Signed)
Miss Riehle it was a pleasure seeing you today.   Please do the following:  Increase your Ozempic to two injections of 0.5mg  once weekly on Mondays as directed today during your appointment. If you have any questions or if you believe something has occurred because of this change, call me or your doctor to let one of [place] know.  Continue checking blood sugars at home. It's really important that you record these and bring these in to your next doctor's appointment.  Continue making the lifestyle changes we've discussed together during our visit. Diet and exercise play a significant role in improving your blood sugars.  Follow-up with me in one month   Hypoglycemia or low blood sugar:   Low blood sugar can happen quickly and may become an emergency if not treated right away.   While this shouldn't happen often, it can be brought upon if you skip a meal or do not eat enough. Also, if your insulin or other diabetes medications are dosed too high, this can cause your blood sugar to go to low.   Warning signs of low blood sugar include: Feeling shaky or dizzy Feeling weak or tired  Excessive hunger Feeling anxious or upset  Sweating even when you aren't exercising  What to do if I experience low blood sugar? Follow the Rule of 15 Check your blood sugar with your meter. If lower than 70, proceed to step 2.  Treat with 15 grams of fast acting carbs which is found in 3-4 glucose tablets. If none are available you can try hard candy, 1 tablespoon of sugar or honey,4 ounces of fruit juice, or 6 ounces of REGULAR soda.  Re-check your sugar in 15 minutes. If it is still below 70, do what you did in step 2 again. If your blood sugar has come back up, go ahead and eat a snack or small meal made up of complex carbs (ex. Whole grains) and protein at this time to avoid recurrence of low blood sugar.

## 2021-09-04 NOTE — Progress Notes (Signed)
Subjective:    Patient ID: [name], female    DOB: [birth year], 71 y.o.   MRN: [id number]  HPI Patient is a 71 y.o. female who presents for diabetes management. She is in good spirits and presents with assistance of wheelchair. Patient was referred and last seen by Primary Care Provider on 05/21/21.  Patient reports diabetes was diagnosed around 10 years ago.   Insurance coverage/medication affordability: UHC Medicare/ Medicaid  Current diabetes medications include: lantus 38 units once daily, metformin XR 542m twice daily, Humalog 13 units TID after meals, Ozempic 0.555monce weekly on Mondays. Current hypertension medications include: N/a Current hyperlipidemia medications include: N./a Patient states that She is taking her medications as prescribed. Patient reports improvement in adherence and uses Alexa device to help set reminders.  Do you feel that your medications are working for you?  yes  Have you been experiencing any side effects to the medications prescribed? No; did not feel well after first dose Ozempic but states this has improved  Do you have any problems obtaining medications due to transportation or finances?  no    Patient reported dietary habits: ("I have to eat what I feed the kids") Eats 2 meals/day and 1 snacks/day Breakfast: skips Lunch: pasta or salisbury steak with bread Dinner: dinner similar to lunch Snacks: reports decreasing ice cream consumption Drinks:sweet tea (2-3 cups/week), water  Patient-reported exercise habits: denies   Patient denies hypoglycemic events. Patient denies polyuria (increased urination).  Patient reports polyphagia (increased appetite).  Patient reports polydipsia (increased thirst).  Patient reports neuropathy (nerve pain). Patient reports visual changes. Patient reports self foot exams.   Freestyle Libre 2.0 patient education Patient taking >500 mg Vitamin C: denies Reminded patient to not take Vitamin C with  Freestyle Libre.  Safety and Troubleshooting 1. Scan the sensor at least every 8 hours 2. When the "test BG" symbol appears, test fingerstick blood glucose prior to    making treatment decisions 3. Do a fingerstick blood glucose test if the sensor readings do not match how    you feel 4. Remove sensor prior for MRI or CT. Sensor may be damaged by exposure to    airport x-ray screening 5. Vitamin C may cause false high readings and aspirin may cause false low     readings 6. Store sensor kit between 39 and 77 degrees. Can be refrigerated at this temp.  Contact information provided for [employer] service and/or trainer.  Objective:   CGM report from last 14 days    Labs:   Physical Exam Constitutional:      Appearance: She is obese.  Neurological:     Mental Status: She is alert and oriented to person, place, and time.    Review of Systems  Gastrointestinal:  Negative for abdominal pain, nausea and vomiting.  Psychiatric/Behavioral:  Positive for memory loss.    Lab Results  Component Value Date   HGBA1C 6.7 09/04/2021   HGBA1C 9.6 (A) 05/21/2021   HGBA1C 9.6 (H) 05/04/2020    Lipid Panel     Component Value Date/Time   CHOL 150 11/08/2019 1344   TRIG 107 04/30/2020 1626   HDL 66 11/08/2019 1344   CHOLHDL 2.3 11/08/2019 1344   CHOLHDL 2.5 10/27/2012 1653   VLDL 30 10/27/2012 1653   LDLCALC 60 11/08/2019 1344    Clinical Atherosclerotic Cardiovascular Disease (ASCVD): No  The ASCVD Risk score (Arnett DK, et al., 2019) failed to calculate for the following reasons:   The  patient has a prior MI or stroke diagnosis    Assessment/Plan:   T2DM is controlled likely due to patient's improvement in adherence and dietary choices.  Will increase patient's Ozempic from 0.59m to 154min order to work towards maximum dose of 68m69mnce weekly in hopes to decrease insulin dosing. Following instruction patient verbalized understanding of treatment plan.    Continued  basal insulin lantus 38 units once daily Continued Humalog 13 units TID but reminded patient to take BEFORE meal Increased dose of GLP-1 Ozempic to 1mg768mce weekly  Continued metformin 500mg3mce daily Extensively discussed pathophysiology of diabetes, dietary effects on blood sugar control, and recommended lifestyle interventions Patient will adhere to dietary modifications Counseled on s/sx of and management of hypoglycemia Next A1C anticipated April 2023  Follow-up appointment one month to review sugar readings. Written patient instructions provided.  This appointment required 30 minutes of direct patient care.  Thank you for involving pharmacy to assist in providing this patient's care.  Patient seen with Eudelia Gala MurdochrmD Candidate.  The patient is currently using Continuous Glucose Monitoring. The patient is injecting insulin 4 times a day and was previously testing blood glucose 3-4 times a day. The patient is making adjustments to their diabetes regimen based on glucose readings.  readings.

## 2021-09-04 NOTE — Assessment & Plan Note (Signed)
T2DM is controlled likely due to patient's improvement in adherence and dietary choices.  Will increase patient's Ozempic from 0.5mg  to 1mg  in order to work towards maximum dose of 2mg  once weekly in hopes to decrease insulin dosing. Following instruction patient verbalized understanding of treatment plan.    1. Continued basal insulin lantus 38 units once daily 2. Continued Humalog 13 units TID but reminded patient to take BEFORE meal 3. Increased dose of GLP-1 Ozempic to 1mg  once weekly  4. Continued metformin 500mg  twice daily 5. Extensively discussed pathophysiology of diabetes, dietary effects on blood sugar control, and recommended lifestyle interventions 6. Patient will adhere to dietary modifications 7. Counseled on s/sx of and management of hypoglycemia 8. Next A1C anticipated April 2023  Follow-up appointment one month to review sugar readings.

## 2021-09-06 ENCOUNTER — Telehealth: Payer: Self-pay

## 2021-09-06 NOTE — Telephone Encounter (Signed)
Patient calls nurse line reporting she needs a Agricultural engineer Neo" sent to [employer].   Will forward to pharmacy team for direction.

## 2021-09-07 ENCOUNTER — Other Ambulatory Visit: Payer: Self-pay | Admitting: Family Medicine

## 2021-09-09 DIAGNOSIS — R062 Wheezing: Secondary | ICD-10-CM | POA: Diagnosis not present

## 2021-09-09 DIAGNOSIS — M25569 Pain in unspecified knee: Secondary | ICD-10-CM | POA: Diagnosis not present

## 2021-09-09 DIAGNOSIS — U071 COVID-19: Secondary | ICD-10-CM | POA: Diagnosis not present

## 2021-09-09 DIAGNOSIS — I5020 Unspecified systolic (congestive) heart failure: Secondary | ICD-10-CM | POA: Diagnosis not present

## 2021-09-09 DIAGNOSIS — C3490 Malignant neoplasm of unspecified part of unspecified bronchus or lung: Secondary | ICD-10-CM | POA: Diagnosis not present

## 2021-09-10 DIAGNOSIS — U071 COVID-19: Secondary | ICD-10-CM | POA: Diagnosis not present

## 2021-09-10 DIAGNOSIS — M25569 Pain in unspecified knee: Secondary | ICD-10-CM | POA: Diagnosis not present

## 2021-09-10 DIAGNOSIS — R062 Wheezing: Secondary | ICD-10-CM | POA: Diagnosis not present

## 2021-09-10 DIAGNOSIS — I5020 Unspecified systolic (congestive) heart failure: Secondary | ICD-10-CM | POA: Diagnosis not present

## 2021-09-10 DIAGNOSIS — C3490 Malignant neoplasm of unspecified part of unspecified bronchus or lung: Secondary | ICD-10-CM | POA: Diagnosis not present

## 2021-09-12 MED ORDER — FREESTYLE PRECISION NEO TEST VI STRP: ORAL_STRIP | 12 refills | Status: DC

## 2021-09-16 ENCOUNTER — Telehealth: Payer: Self-pay

## 2021-09-16 MED ORDER — OZEMPIC (1 MG/DOSE) 4 MG/3ML ~~LOC~~ SOPN: 1.0000 mg | PEN_INJECTOR | SUBCUTANEOUS | 0 refills | Status: DC

## 2021-09-16 NOTE — Telephone Encounter (Signed)
Sent in RX for Ozempic 1mg  once weekly

## 2021-09-16 NOTE — Telephone Encounter (Signed)
Patient calls nurse line requesting a new prescription for Ozempic 1mg . Patient reports she has been taking two shots to equal 1mg  from latest pen, however has now run out. Patient was increased to 1mg  at last visit on 1/18.  Please send in Ozempic 1mg .

## 2021-09-17 NOTE — Telephone Encounter (Signed)
Noted and agree. 

## 2021-09-19 ENCOUNTER — Ambulatory Visit: Payer: Medicare Other | Admitting: Hematology and Oncology

## 2021-09-19 ENCOUNTER — Encounter: Payer: Self-pay | Admitting: Oncology

## 2021-09-26 ENCOUNTER — Inpatient Hospital Stay: Payer: Medicare Other | Attending: Oncology

## 2021-09-26 ENCOUNTER — Other Ambulatory Visit: Payer: Self-pay

## 2021-09-26 ENCOUNTER — Inpatient Hospital Stay: Payer: Medicare Other

## 2021-09-26 VITALS — BP 109/88 | HR 64 | Temp 98.2°F | Resp 18 | Ht 64.0 in | Wt 309.0 lb

## 2021-09-26 DIAGNOSIS — Z5112 Encounter for antineoplastic immunotherapy: Secondary | ICD-10-CM | POA: Insufficient documentation

## 2021-09-26 DIAGNOSIS — Z171 Estrogen receptor negative status [ER-]: Secondary | ICD-10-CM | POA: Insufficient documentation

## 2021-09-26 DIAGNOSIS — E1149 Type 2 diabetes mellitus with other diabetic neurological complication: Secondary | ICD-10-CM | POA: Diagnosis not present

## 2021-09-26 DIAGNOSIS — C50212 Malignant neoplasm of upper-inner quadrant of left female breast: Secondary | ICD-10-CM | POA: Insufficient documentation

## 2021-09-26 DIAGNOSIS — Z95828 Presence of other vascular implants and grafts: Secondary | ICD-10-CM

## 2021-09-26 LAB — CBC WITH DIFFERENTIAL (CANCER CENTER ONLY)
Abs Immature Granulocytes: 0.03 10*3/uL (ref 0.00–0.07)
Basophils Absolute: 0 10*3/uL (ref 0.0–0.1)
Basophils Relative: 0 %
Eosinophils Absolute: 0.1 10*3/uL (ref 0.0–0.5)
Eosinophils Relative: 2 %
HCT: 42.7 % (ref 36.0–46.0)
Hemoglobin: 14.1 g/dL (ref 12.0–15.0)
Immature Granulocytes: 0 %
Lymphocytes Relative: 34 %
Lymphs Abs: 2.5 10*3/uL (ref 0.7–4.0)
MCH: 29.3 pg (ref 26.0–34.0)
MCHC: 33 g/dL (ref 30.0–36.0)
MCV: 88.8 fL (ref 80.0–100.0)
Monocytes Absolute: 0.5 10*3/uL (ref 0.1–1.0)
Monocytes Relative: 7 %
Neutro Abs: 4.1 10*3/uL (ref 1.7–7.7)
Neutrophils Relative %: 57 %
Platelet Count: 213 10*3/uL (ref 150–400)
RBC: 4.81 MIL/uL (ref 3.87–5.11)
RDW: 14.5 % (ref 11.5–15.5)
WBC Count: 7.3 10*3/uL (ref 4.0–10.5)
nRBC: 0 % (ref 0.0–0.2)

## 2021-09-26 LAB — CMP (CANCER CENTER ONLY)
ALT: 65 U/L — ABNORMAL HIGH (ref 0–44)
AST: 42 U/L — ABNORMAL HIGH (ref 15–41)
Albumin: 3.7 g/dL (ref 3.5–5.0)
Alkaline Phosphatase: 90 U/L (ref 38–126)
Anion gap: 5 (ref 5–15)
BUN: 13 mg/dL (ref 8–23)
CO2: 32 mmol/L (ref 22–32)
Calcium: 9.1 mg/dL (ref 8.9–10.3)
Chloride: 103 mmol/L (ref 98–111)
Creatinine: 0.62 mg/dL (ref 0.44–1.00)
GFR, Estimated: >60 mL/min (ref >60)
Glucose, Bld: 98 mg/dL (ref 70–99)
Potassium: 4.2 mmol/L (ref 3.5–5.1)
Sodium: 140 mmol/L (ref 135–145)
Total Bilirubin: 0.5 mg/dL (ref 0.3–1.2)
Total Protein: 7.3 g/dL (ref 6.5–8.1)

## 2021-09-26 MED ORDER — LORAZEPAM 2 MG/ML IJ SOLN
0.5000 mg | Freq: Once | INTRAMUSCULAR | Status: AC
  Administered 2021-09-26: 0.5 mg via INTRAVENOUS
  Filled 2021-09-26: qty 1

## 2021-09-26 MED ORDER — SODIUM CHLORIDE 0.9% FLUSH
10.0000 mL | Freq: Once | INTRAVENOUS | Status: AC
  Administered 2021-09-26: 10 mL

## 2021-09-26 MED ORDER — HEPARIN SOD (PORK) LOCK FLUSH 100 UNIT/ML IV SOLN
500.0000 [IU] | Freq: Once | INTRAVENOUS | Status: AC | PRN
  Administered 2021-09-26: 500 [IU]

## 2021-09-26 MED ORDER — SODIUM CHLORIDE 0.9 % IV SOLN: Freq: Once | INTRAVENOUS | Status: AC

## 2021-09-26 MED ORDER — TRASTUZUMAB-ANNS CHEMO 150 MG IV SOLR
750.0000 mg | Freq: Once | INTRAVENOUS | Status: AC
  Administered 2021-09-26: 750 mg via INTRAVENOUS
  Filled 2021-09-26: qty 35.72

## 2021-09-26 MED ORDER — SODIUM CHLORIDE 0.9% FLUSH
10.0000 mL | Freq: Once | INTRAVENOUS | Status: AC
  Administered 2021-09-26: 10 mL via INTRAVENOUS

## 2021-09-26 MED ORDER — ACETAMINOPHEN 325 MG PO TABS
650.0000 mg | ORAL_TABLET | Freq: Once | ORAL | Status: AC
  Administered 2021-09-26: 650 mg via ORAL
  Filled 2021-09-26: qty 2

## 2021-09-26 MED ORDER — DIPHENHYDRAMINE HCL 25 MG PO CAPS
25.0000 mg | ORAL_CAPSULE | Freq: Once | ORAL | Status: AC
  Administered 2021-09-26: 25 mg via ORAL
  Filled 2021-09-26: qty 1

## 2021-09-26 NOTE — Patient Instructions (Signed)
[facility]  Discharge Instructions: Thank you for choosing [facility] to provide your oncology and hematology care.   If you have a lab appointment with the [facility], please go directly to the [facility] and check in at the registration area.   Wear comfortable clothing and clothing appropriate for easy access to any Portacath or PICC line.   We strive to give you quality time with your provider. You may need to reschedule your appointment if you arrive late (15 or more minutes).  Arriving late affects you and other patients whose appointments are after yours.  Also, if you miss three or more appointments without notifying the office, you may be dismissed from the clinic at the providers discretion.      For prescription refill requests, have your pharmacy contact our office and allow 72 hours for refills to be completed.    Today you received the following chemotherapy and/or immunotherapy agents: Kanjinti   To help prevent nausea and vomiting after your treatment, we encourage you to take your nausea medication as directed.  BELOW ARE SYMPTOMS THAT SHOULD BE REPORTED IMMEDIATELY: *FEVER GREATER THAN 100.4 F (38 C) OR HIGHER *CHILLS OR SWEATING *NAUSEA AND VOMITING THAT IS NOT CONTROLLED WITH YOUR NAUSEA MEDICATION *UNUSUAL SHORTNESS OF BREATH *UNUSUAL BRUISING OR BLEEDING *URINARY PROBLEMS (pain or burning when urinating, or frequent urination) *BOWEL PROBLEMS (unusual diarrhea, constipation, pain near the anus) TENDERNESS IN MOUTH AND THROAT WITH OR WITHOUT PRESENCE OF ULCERS (sore throat, sores in mouth, or a toothache) UNUSUAL RASH, SWELLING OR PAIN  UNUSUAL VAGINAL DISCHARGE OR ITCHING   Items with * indicate a potential emergency and should be followed up as soon as possible or go to the Emergency Department if any problems should occur.  Please show the CHEMOTHERAPY ALERT CARD or IMMUNOTHERAPY ALERT CARD at check-in to the  Emergency Department and triage nurse.  Should you have questions after your visit or need to cancel or reschedule your appointment, please contact [facility]  Dept: [phone]  and follow the prompts.  Office hours are 8:00 a.m. to 4:30 p.m. Monday - Friday. Please note that voicemails left after 4:00 p.m. may not be returned until the following business day.  We are closed weekends and major holidays. You have access to a nurse at all times for urgent questions. Please call the main number to the clinic Dept: [phone] and follow the prompts.   For any non-urgent questions, you may also contact your provider using MyChart. We now offer e-Visits for anyone 4 and older to request care online for non-urgent symptoms. For details visit [email or web address].   Also download the MyChart app! Go to the app store, search "MyChart", open the app, select [facility], and log in with your MyChart username and password.  Due to Covid, a mask is required upon entering the hospital/clinic. If you do not have a mask, one will be given to you upon arrival. For doctor visits, patients may have 1 support person aged 72 or older with them. For treatment visits, patients cannot have anyone with them due to current Covid guidelines and our immunocompromised population.

## 2021-10-01 NOTE — Progress Notes (Signed)
[facility])                                            [place] Team                                        Statin Quality Measure Assessment    10/01/2021  [name] 1950/08/26 [id number]  Per review of chart and payor information, this patient has been flagged for non-adherence to the following CMS Quality Measure:   [x]  Statin Use in Persons with Diabetes             - A1c was below goal (6.7% on 09/04/2021).   [x]  Statin Use in Persons with Cardiovascular Disease  The ASCVD Risk score (Arnett DK, et al., 2019) failed to calculate for the following reasons:   The patient has a prior MI or stroke diagnosis  Patient is not currently on a statin and has a h/o persistently elevated LFTs likely in the setting of cancer treatment. In 2019, it was noted patient declined statin. If deemed appropriate, please consider assessing need for exclusion code (see options below) or evaluating need for anithyperlipidemic medication.    Please consider ONE of the following recommendations:   Initiate high intensity statin Atorvastatin 40mg  once daily, #90, 3 refills   Rosuvastatin 20mg  once daily, #90, 3 refills    Initiate moderate intensity          statin with reduced frequency if prior          statin intolerance 1x weekly, #13, 3 refills   2x weekly, #26, 3 refills   3x weekly, #39, 3 refills   Code for past statin intolerance or other exclusions (required annually)  Drug Induced Myopathy G72.0   Myositis, unspecified M60.9   Rhabdomyolysis M62.82   Cirrhosis of liver K74.69   Biliary cirrhosis, unspecified K74.5   Abnormal blood glucose - for SUPD ONLY R73.09   Prediabetes - for SUPD ONLY  R73.03   Polycystic ovarian syndrome E28.2   Adverse effect of antihyperlipidemic and antiarteriosclerotic drugs, initial encounter M76.1H1I   Thank you for your time,  Kristeen Miss, Anon [place] Cell: [phone]

## 2021-10-02 ENCOUNTER — Other Ambulatory Visit: Payer: Self-pay

## 2021-10-02 ENCOUNTER — Telehealth (INDEPENDENT_AMBULATORY_CARE_PROVIDER_SITE_OTHER): Payer: Medicare Other | Admitting: Family Medicine

## 2021-10-02 ENCOUNTER — Telehealth (INDEPENDENT_AMBULATORY_CARE_PROVIDER_SITE_OTHER): Payer: Medicare Other | Admitting: Pharmacist

## 2021-10-02 DIAGNOSIS — M48061 Spinal stenosis, lumbar region without neurogenic claudication: Secondary | ICD-10-CM

## 2021-10-02 DIAGNOSIS — E1149 Type 2 diabetes mellitus with other diabetic neurological complication: Secondary | ICD-10-CM

## 2021-10-02 NOTE — Patient Instructions (Signed)
[name] it was a pleasure seeing you today.   Please do the following:  Decrease your Lantus to 35 units once daily as directed today during your appointment. If you have any questions or if you believe something has occurred because of this change, call me or your doctor to let one of [place] know.  Continue checking blood sugars at home. It's really important that you record these and bring these in to your next doctor's appointment.  Continue making the lifestyle changes we've discussed together during our visit. Diet and exercise play a significant role in improving your blood sugars.  Follow-up with Dr. Erin Hearing in April   Hypoglycemia or low blood sugar:   Low blood sugar can happen quickly and may become an emergency if not treated right away.   While this shouldn't happen often, it can be brought upon if you skip a meal or do not eat enough. Also, if your insulin or other diabetes medications are dosed too high, this can cause your blood sugar to go to low.   Warning signs of low blood sugar include: Feeling shaky or dizzy Feeling weak or tired  Excessive hunger Feeling anxious or upset  Sweating even when you aren't exercising  What to do if I experience low blood sugar? Follow the Rule of 15 Check your blood sugar with your meter. If lower than 70, proceed to step 2.  Treat with 15 grams of fast acting carbs which is found in 3-4 glucose tablets. If none are available you can try hard candy, 1 tablespoon of sugar or honey,4 ounces of fruit juice, or 6 ounces of REGULAR soda.  Re-check your sugar in 15 minutes. If it is still below 70, do what you did in step 2 again. If your blood sugar has come back up, go ahead and eat a snack or small meal made up of complex carbs (ex. Whole grains) and protein at this time to avoid recurrence of low blood sugar.   Exercises to do While Sitting Exercises that you do while sitting (chair exercises) can give you many of the same benefits as  full exercise. Benefits include strengthening your heart, burning calories, and keeping muscles and joints healthy. Exercise can also improve your mood and help with depression and anxiety. You may benefit from chair exercises if you are unable to do standing exercises due to: Diabetic foot pain. Obesity. Illness. Arthritis. Recovery from surgery or injury. Breathing problems. Balance problems. Another type of disability. Before starting chair exercises, check with your health care provider or a physical therapist to find out how much exercise you can tolerate and which exercises are safe for you. If your health care provider approves: Start out slowly and build up over time. Aim to work up to about 10-20 minutes for each exercise session. Make exercise part of your daily routine. Drink water when you exercise. Do not wait until you are thirsty. Drink every 10-15 minutes. Stop exercising right away if you have pain, nausea, shortness of breath, or dizziness. If you are exercising in a wheelchair, make sure to lock the wheels. Ask your health care provider whether you can do tai chi or yoga. Many positions in these mind-body exercises can be modified to do while seated. Warm-up Before starting other exercises: Sit up as straight as you can. Have your knees bent at 90 degrees, which is the shape of the capital letter "L." Keep your feet flat on the floor. Sit at the front edge of  your chair, if you can. Pull in (tighten) the muscles in your abdomen and stretch your spine and neck as straight as you can. Hold this position for a few minutes. Breathe in and out evenly. Try to concentrate on your breathing, and relax your mind. Stretching Exercise A: Arm stretch Hold your arms out straight in front of your body. Bend your hands at the wrist with your fingers pointing up, as if signaling someone to stop. Notice the slight tension in your forearms as you hold the position. Keeping your arms out  and your hands bent, rotate your hands outward as far as you can and hold this stretch. Aim to have your thumbs pointing up and your pinkie fingers pointing down. Slowly repeat arm stretches for one minute as tolerated. Exercise B: Leg stretch If you can move your legs, try to "draw" letters on the floor with the toes of your foot. Write your name with one foot. Write your name with the toes of your other foot. Slowly repeat the movements for one minute as tolerated. Exercise C: Reach for the sky Reach your hands as far over your head as you can to stretch your spine. Move your hands and arms as if you are climbing a rope. Slowly repeat the movements for one minute as tolerated. Range of motion exercises Exercise A: Shoulder roll Let your arms hang loosely at your sides. Lift just your shoulders up toward your ears, then let them relax back down. When your shoulders feel loose, rotate your shoulders in backward and forward circles. Do shoulder rolls slowly for one minute as tolerated. Exercise B: March in place As if you are marching, pump your arms and lift your legs up and down. Lift your knees as high as you can. If you are unable to lift your knees, just pump your arms and move your ankles and feet up and down. March in place for one minute as tolerated. Exercise C: Seated jumping jacks Let your arms hang down straight. Keeping your arms straight, lift them up over your head. Aim to point your fingers to the ceiling. While you lift your arms, straighten your legs and slide your heels along the floor to your sides, as wide as you can. As you bring your arms back down to your sides, slide your legs back together. If you are unable to use your legs, just move your arms. Slowly repeat seated jumping jacks for one minute as tolerated. Strengthening exercises Exercise A: Shoulder squeeze Hold your arms straight out from your body to your sides, with your elbows bent and your fists pointed  at the ceiling. Keeping your arms in the bent position, move them forward so your elbows and forearms meet in front of your face. Open your arms back out as wide as you can with your elbows still bent, until you feel your shoulder blades squeezing together. Hold for 5 seconds. Slowly repeat the movements forward and backward for one minute as tolerated. Contact a health care provider if: You have to stop exercising due to any of the following: Pain. Nausea. Shortness of breath. Dizziness. Fatigue. You have significant pain or soreness after exercising. Get help right away if: You have chest pain. You have difficulty breathing. These symptoms may represent a serious problem that is an emergency. Do not wait to see if the symptoms will go away. Get medical help right away. Call your local emergency services (911 in the [place] Do not drive yourself to the hospital. Summary  Exercises that you do while sitting (chair exercises) can strengthen your heart, burn calories, and keep muscles and joints healthy. You may benefit from chair exercises if you are unable to do standing exercises due to diabetic foot pain, obesity, recovery from surgery or injury, or other conditions. Before starting chair exercises, check with your health care provider or a physical therapist to find out how much exercise you can tolerate and which exercises are safe for you. This information is not intended to replace advice given to you by your health care provider. Make sure you discuss any questions you have with your health care provider. Document Revised: 09/30/2020 Document Reviewed: 09/30/2020 Elsevier Patient Education  2022 [employer].

## 2021-10-02 NOTE — Assessment & Plan Note (Signed)
Stable.  Encouraged her that with weight loss maybe able to ambulate further than 23 steps which is her goal!

## 2021-10-02 NOTE — Addendum Note (Signed)
Addended by: Talbert Cage L on: 10/02/2021 03:54 PM   Modules accepted: Orders

## 2021-10-02 NOTE — Assessment & Plan Note (Signed)
Improving.  Continue her current regimen

## 2021-10-02 NOTE — Progress Notes (Signed)
[employer] [facility] Telemedicine Visit  Patient consented to have virtual visit and was identified by name and date of birth. Method of visit: Video  Encounter participants: Patient: [name] - located at home  Provider: [name] - located at office Others (if applicable): non  Chief Complaint: Diabetes follow up Med Check   HPI:  Diabetes Her blood sugars are almost all in range on CGM.  No low blood sugar  DJD Continues to have pain and only able to walk 23 steps.  Weight Has been losing weight.  She is happy about this although the ozempic does cause her mild stomach upset and occls diarrhea that she treats with imodium as needed   ROS: per HPI    Exam:  There were no vitals taken for this visit.  Respiratory: normal Good spirits   Assessment/Plan:  Diabetes mellitus type 2 with neurological manifestations ([place]) Improving.  Continue her current regimen   LUMBAR SPINAL STENOSIS Stable.  Encouraged her that with weight loss maybe able to ambulate further than 23 steps which is her goal!    Time spent during visit with patient: 22 minutes

## 2021-10-02 NOTE — Progress Notes (Signed)
Subjective:    Patient ID: [name], female    DOB: [birth year], 71 y.o.   MRN: [id number]  HPI Patient is a 71 y.o. female who presents for diabetes management. She is in good spirits and presents via telehealth. Patient was referred and last seen by Primary Care Provider on 05/21/21. Last seen in pharmacy clinic on 09/04/21.  Patient reports diabetes was diagnosed around 10 years ago.   Insurance coverage/medication affordability: UHC Medicare/ Medicaid  Current diabetes medications include: Lantus 38 units once daily, metformin XR 578m twice daily, Humalog 13 units TID after meals, Ozempic 142monce weekly on Mondays. Current hypertension medications include: N/a Current hyperlipidemia medications include: N./a Patient states that She is taking her medications as prescribed. Patient reports improvement in adherence and uses Alexa device to help set reminders.  Do you feel that your medications are working for you?  yes  Have you been experiencing any side effects to the medications prescribed? Yes; intermittent diarrhea from Ozempic  Do you have any problems obtaining medications due to transportation or finances?  no    Patient reported dietary habits: ("I have to eat what I feed the kids") Eats 2 meals/day and 1 snacks/day Breakfast: skips Lunch: pasta or salisbury steak with bread Dinner: dinner similar to lunch Snacks: reports decreasing ice cream consumption Drinks:sweet tea (2-3 cups/week), water  Patient-reported exercise habits: denies   Patient denies hypoglycemic events. Patient denies polyuria (increased urination).  Patient denies polyphagia (increased appetite).  Patient denies polydipsia (increased thirst).  Patient reports neuropathy (nerve pain). Patient denies visual changes. Patient reports self foot exams.   Freestyle Libre 2.0 patient education Patient taking >500 mg Vitamin C: denies Reminded patient to not take Vitamin C with Freestyle  Libre.  Safety and Troubleshooting 1. Scan the sensor at least every 8 hours 2. When the "test BG" symbol appears, test fingerstick blood glucose prior to    making treatment decisions 3. Do a fingerstick blood glucose test if the sensor readings do not match how    you feel 4. Remove sensor prior for MRI or CT. Sensor may be damaged by exposure to    airport x-ray screening 5. Vitamin C may cause false high readings and aspirin may cause false low     readings 6. Store sensor kit between 39 and 77 degrees. Can be refrigerated at this temp.  Contact information provided for [employer] service and/or trainer.  Objective:   CGM report from last 14 days    Labs:   Physical Exam Constitutional:      Appearance: She is obese.  Neurological:     Mental Status: She is alert and oriented to person, place, and time.    Review of Systems  Gastrointestinal:  Negative for abdominal pain, nausea and vomiting.  Psychiatric/Behavioral:  Positive for memory loss.    Lab Results  Component Value Date   HGBA1C 6.7 09/04/2021   HGBA1C 9.6 (A) 05/21/2021   HGBA1C 9.6 (H) 05/04/2020    Lipid Panel     Component Value Date/Time   CHOL 150 11/08/2019 1344   TRIG 107 04/30/2020 1626   HDL 66 11/08/2019 1344   CHOLHDL 2.3 11/08/2019 1344   CHOLHDL 2.5 10/27/2012 1653   VLDL 30 10/27/2012 1653   LDLCALC 60 11/08/2019 1344    Clinical Atherosclerotic Cardiovascular Disease (ASCVD): No  The ASCVD Risk score (Arnett DK, et al., 2019) failed to calculate for the following reasons:   The patient has a  prior MI or stroke diagnosis    Assessment/Plan:   T2DM is controlled likely due to patient's improvement in adherence and dietary choices. At this time will hold off on increasing Ozempic due to patient experiencing intermittent diarrhea. Patient's blood glucose readings are very well controlled and patient expressed interest in decreasing insulin therefore at this time feel  comfortable decreasing patient's Lantus to 35 units once daily. Following instruction patient verbalized understanding of treatment plan.    Decreased dose of basal insulin Lantus to 35 units once daily Continued Humalog 13 units TID but reminded patient to take BEFORE meal Increased dose of GLP-1 Ozempic to 10m once weekly  Continued metformin 5035mtwice daily Extensively discussed pathophysiology of diabetes, dietary effects on blood sugar control, and recommended lifestyle interventions Patient will adhere to dietary modifications Counseled on s/sx of and management of hypoglycemia Next A1C anticipated April 2023  Follow-up appointment two months to review sugar readings. Written patient instructions provided.  This appointment required 28 minutes of non-face-to-face direct patient care.  Thank you for involving pharmacy to assist in providing this patient's care.  The patient is currently using Continuous Glucose Monitoring. The patient is injecting insulin 4 times a day and was previously testing blood glucose 3-4 times a day. The patient is making adjustments to their diabetes regimen based on glucose readings.  readings.

## 2021-10-02 NOTE — Assessment & Plan Note (Signed)
T2DM is controlled likely due to patient's improvement in adherence and dietary choices. At this time will hold off on increasing Ozempic due to patient experiencing intermittent diarrhea. Patient's blood glucose readings are very well controlled and patient expressed interest in decreasing insulin therefore at this time feel comfortable decreasing patient's Lantus to 35 units once daily. Following instruction patient verbalized understanding of treatment plan.    1. Decreased dose of basal insulin Lantus to 35 units once daily 2. Continued Humalog 13 units TID but reminded patient to take BEFORE meal 3. Increased dose of GLP-1 Ozempic to 1mg  once weekly  4. Continued metformin 500mg  twice daily 5. Extensively discussed pathophysiology of diabetes, dietary effects on blood sugar control, and recommended lifestyle interventions 6. Patient will adhere to dietary modifications 7. Counseled on s/sx of and management of hypoglycemia 8. Next A1C anticipated April 2023

## 2021-10-04 ENCOUNTER — Other Ambulatory Visit: Payer: Self-pay | Admitting: Family Medicine

## 2021-10-05 ENCOUNTER — Other Ambulatory Visit: Payer: Self-pay | Admitting: Family Medicine

## 2021-10-07 ENCOUNTER — Telehealth: Payer: Self-pay

## 2021-10-07 NOTE — Telephone Encounter (Signed)
Receipt of order confirmed by [employer].   Talbot Grumbling, RN

## 2021-10-07 NOTE — Telephone Encounter (Signed)
Community message sent to [employer] for rolling walker with seat.   Will await response.   Talbot Grumbling, RN

## 2021-10-08 ENCOUNTER — Ambulatory Visit (HOSPITAL_COMMUNITY)
Admission: RE | Admit: 2021-10-08 | Discharge: 2021-10-08 | Disposition: A | Discharge status: Home / Self Care | Payer: Medicare Other | Source: Ambulatory Visit | Attending: Hematology and Oncology | Admitting: Hematology and Oncology

## 2021-10-08 ENCOUNTER — Encounter (HOSPITAL_COMMUNITY): Payer: Self-pay

## 2021-10-08 ENCOUNTER — Other Ambulatory Visit: Payer: Self-pay

## 2021-10-08 DIAGNOSIS — C50919 Malignant neoplasm of unspecified site of unspecified female breast: Secondary | ICD-10-CM | POA: Insufficient documentation

## 2021-10-08 DIAGNOSIS — Z853 Personal history of malignant neoplasm of breast: Secondary | ICD-10-CM | POA: Diagnosis not present

## 2021-10-08 DIAGNOSIS — N200 Calculus of kidney: Secondary | ICD-10-CM | POA: Diagnosis not present

## 2021-10-08 DIAGNOSIS — C7800 Secondary malignant neoplasm of unspecified lung: Secondary | ICD-10-CM | POA: Diagnosis not present

## 2021-10-08 DIAGNOSIS — C50212 Malignant neoplasm of upper-inner quadrant of left female breast: Secondary | ICD-10-CM | POA: Insufficient documentation

## 2021-10-08 DIAGNOSIS — Z85118 Personal history of other malignant neoplasm of bronchus and lung: Secondary | ICD-10-CM | POA: Diagnosis not present

## 2021-10-08 DIAGNOSIS — Z170 Estrogen receptor positive status [ER+]: Secondary | ICD-10-CM | POA: Diagnosis not present

## 2021-10-08 DIAGNOSIS — Z5111 Encounter for antineoplastic chemotherapy: Secondary | ICD-10-CM | POA: Diagnosis not present

## 2021-10-08 MED ORDER — IOHEXOL 300 MG/ML  SOLN
100.0000 mL | Freq: Once | INTRAMUSCULAR | Status: AC | PRN
  Administered 2021-10-08: 100 mL via INTRAVENOUS

## 2021-10-08 MED ORDER — HEPARIN SOD (PORK) LOCK FLUSH 100 UNIT/ML IV SOLN
500.0000 [IU] | Freq: Once | INTRAVENOUS | Status: AC
Start: 2021-10-08 — End: 2021-10-08

## 2021-10-08 MED ORDER — HEPARIN SOD (PORK) LOCK FLUSH 100 UNIT/ML IV SOLN
INTRAVENOUS | Status: AC
  Administered 2021-10-08: 500 [IU] via INTRAVENOUS
  Filled 2021-10-08: qty 5

## 2021-10-10 DIAGNOSIS — C3490 Malignant neoplasm of unspecified part of unspecified bronchus or lung: Secondary | ICD-10-CM | POA: Diagnosis not present

## 2021-10-10 DIAGNOSIS — U071 COVID-19: Secondary | ICD-10-CM | POA: Diagnosis not present

## 2021-10-10 DIAGNOSIS — I5020 Unspecified systolic (congestive) heart failure: Secondary | ICD-10-CM | POA: Diagnosis not present

## 2021-10-10 DIAGNOSIS — R062 Wheezing: Secondary | ICD-10-CM | POA: Diagnosis not present

## 2021-10-10 DIAGNOSIS — M25569 Pain in unspecified knee: Secondary | ICD-10-CM | POA: Diagnosis not present

## 2021-10-11 DIAGNOSIS — M25569 Pain in unspecified knee: Secondary | ICD-10-CM | POA: Diagnosis not present

## 2021-10-11 DIAGNOSIS — C3490 Malignant neoplasm of unspecified part of unspecified bronchus or lung: Secondary | ICD-10-CM | POA: Diagnosis not present

## 2021-10-11 DIAGNOSIS — R062 Wheezing: Secondary | ICD-10-CM | POA: Diagnosis not present

## 2021-10-11 DIAGNOSIS — I5020 Unspecified systolic (congestive) heart failure: Secondary | ICD-10-CM | POA: Diagnosis not present

## 2021-10-11 DIAGNOSIS — U071 COVID-19: Secondary | ICD-10-CM | POA: Diagnosis not present

## 2021-10-20 ENCOUNTER — Encounter (HOSPITAL_COMMUNITY): Payer: Self-pay

## 2021-10-20 ENCOUNTER — Other Ambulatory Visit: Payer: Self-pay

## 2021-10-20 ENCOUNTER — Emergency Department (HOSPITAL_COMMUNITY)
Admission: EM | Admit: 2021-10-20 | Discharge: 2021-10-20 | Disposition: A | Discharge status: Home / Self Care | Payer: Medicare Other | Attending: Emergency Medicine | Admitting: Emergency Medicine

## 2021-10-20 DIAGNOSIS — M25552 Pain in left hip: Secondary | ICD-10-CM | POA: Insufficient documentation

## 2021-10-20 DIAGNOSIS — Z7984 Long term (current) use of oral hypoglycemic drugs: Secondary | ICD-10-CM | POA: Insufficient documentation

## 2021-10-20 DIAGNOSIS — E1140 Type 2 diabetes mellitus with diabetic neuropathy, unspecified: Secondary | ICD-10-CM | POA: Insufficient documentation

## 2021-10-20 DIAGNOSIS — S81802A Unspecified open wound, left lower leg, initial encounter: Secondary | ICD-10-CM | POA: Insufficient documentation

## 2021-10-20 DIAGNOSIS — Z794 Long term (current) use of insulin: Secondary | ICD-10-CM | POA: Diagnosis not present

## 2021-10-20 DIAGNOSIS — X58XXXA Exposure to other specified factors, initial encounter: Secondary | ICD-10-CM | POA: Diagnosis not present

## 2021-10-20 DIAGNOSIS — S8992XA Unspecified injury of left lower leg, initial encounter: Secondary | ICD-10-CM | POA: Diagnosis present

## 2021-10-20 MED ORDER — DOXYCYCLINE HYCLATE 100 MG PO CAPS: 100.0000 mg | ORAL_CAPSULE | Freq: Two times a day (BID) | ORAL | 0 refills | Status: AC

## 2021-10-20 MED ORDER — ACETAMINOPHEN 325 MG PO TABS: 650.0000 mg | ORAL_TABLET | Freq: Four times a day (QID) | ORAL | 0 refills | Status: DC | PRN

## 2021-10-20 MED ORDER — ACETAMINOPHEN 325 MG PO TABS
650.0000 mg | ORAL_TABLET | Freq: Four times a day (QID) | ORAL | Status: DC | PRN
  Administered 2021-10-20: 650 mg via ORAL
  Filled 2021-10-20: qty 2

## 2021-10-20 MED ORDER — DOXYCYCLINE HYCLATE 100 MG PO TABS
100.0000 mg | ORAL_TABLET | Freq: Once | ORAL | Status: AC
Start: 2021-10-20 — End: 2021-10-20
  Administered 2021-10-20: 100 mg via ORAL
  Filled 2021-10-20: qty 1

## 2021-10-20 MED ORDER — IBUPROFEN 200 MG PO TABS
600.0000 mg | ORAL_TABLET | Freq: Once | ORAL | Status: AC
Start: 2021-10-20 — End: 2021-10-20
  Administered 2021-10-20: 600 mg via ORAL
  Filled 2021-10-20: qty 3

## 2021-10-20 MED ORDER — IBUPROFEN 600 MG PO TABS: 600.0000 mg | ORAL_TABLET | Freq: Three times a day (TID) | ORAL | 0 refills | Status: DC | PRN

## 2021-10-20 NOTE — ED Triage Notes (Addendum)
Pt reports with a wound to her left shin x 2 weeks. Pt states that she believes that it is a diabetic ulcer. Pt states that she feels like she has a blood clot due to have pain in her left groin x 1 day that radiates to her left thigh. Pt has swelling to her left leg. Pt is on 4 L O2 via [place] per baseline.  ?

## 2021-10-20 NOTE — ED Notes (Signed)
Pt request to remain in wheelchair and not be place in bed due to pain at the time ?

## 2021-10-20 NOTE — ED Provider Notes (Signed)
?[facility]-EMERGENCY DEPT ?Provider Note ? ? ?CSN: [id number] ?Arrival date & time: 10/20/21  2154 ? ?  ? ?History ? ?Chief Complaint  ?Patient presents with  ? leg wound  ? ? ?[name] is a 70 y.o. female with history of obesity, diabetes, presenting to the ED with an ulcer on her left leg and pain in her left hip.  Patient reports that she noted this ulceration on her left mid leg several days ago, she washed it with alcohol originally, and feels that it has been healing to slowly.  She does have some neuropathy in her feet.  The wound is on her left mid shin. ? ?She also reports for the past 2 days she has had a sharp, stabbing pain in her left hip that radiates from the edge of her hip to the front of her left thigh.  It is much worse with walking and bearing weight.  She reports a history of chronic back pain.  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  She is not on blood thinners ? ?HPI ? ?  ? ?Home Medications ?Prior to Admission medications   ?Medication Sig Start Date End Date Taking? Authorizing Provider  ?acetaminophen (TYLENOL) 325 MG tablet Take 2 tablets (650 mg total) by mouth every 6 (six) hours as needed for up to 21 doses for moderate pain or mild pain. 10/20/21  Yes Trifan, Matthew J, MD  ?doxycycline (VIBRAMYCIN) 100 MG capsule Take 1 capsule (100 mg total) by mouth 2 (two) times daily for 4 days. 10/21/21 10/25/21 Yes Trifan, Matthew J, MD  ?ibuprofen (ADVIL) 600 MG tablet Take 1 tablet (600 mg total) by mouth every 8 (eight) hours as needed for up to 21 doses for mild pain or moderate pain. 10/20/21  Yes Trifan, Matthew J, MD  ?insulin glargine (LANTUS SOLOSTAR) 100 UNIT/ML Solostar Pen INJECT 36 UNITS INTO THE SKIN DAILY. 10/04/21   Chambliss, Marshall L, MD  ?albuterol (VENTOLIN HFA) 108 (90 Base) MCG/ACT inhaler INHALE 2 PUFFS INTO THE LUNGS EVERY 6 HOURS AS NEEDED FOR WHEEZE 09/09/21   Chambliss, Marshall L, MD  ?Blood Glucose Monitoring Suppl (ONE TOUCH ULTRA  2) w/Device KIT 1 kit by Does not apply route QID. ICD 10-code: E11.49. 07/11/19   Chambliss, Marshall L, MD  ?fluconazole (DIFLUCAN) 100 MG tablet Take 1 tablet (100 mg total) by mouth daily. Take 2 tabs day 1 then 1 tablet daily 6 days ?Patient not taking: Reported on 06/17/2021 01/16/21   Magrinat, Gustav C, MD  ?glucose blood (FREESTYLE PRECISION NEO TEST) test strip Use as instructed to check once daily 09/12/21   Brown, Carina M, MD  ?insulin lispro (HUMALOG KWIKPEN) 100 UNIT/ML KwikPen INJECT 12 UNITS INTO THE SKIN 3 TIMES A DAY WITH MEALS- PLEASE SCHEDULE OFFICE VISIT ?Patient taking differently: INJECT 13 UNITS INTO THE SKIN 3 TIMES A DAY WITH MEALS- PLEASE SCHEDULE OFFICE VISIT 04/08/21   Chambliss, Marshall L, MD  ?Insulin Pen Needle (PEN NEEDLES) 30G X 8 MM MISC Please use to inject insulin 4 times daily. 08/14/21   Brown, Carina M, MD  ?ketoconazole (NIZORAL) 2 % cream APPLY 1 APPLICATION TOPICALLY DAILY ?Patient not taking: Reported on 09/04/2021 07/29/21   Magrinat, Gustav C, MD  ?letrozole (FEMARA) 2.5 MG tablet Take 1 tablet (2.5 mg total) by mouth daily. ?Patient not taking: Reported on 09/04/2021 08/29/21   Iruku, Praveena, MD  ?loperamide (IMODIUM) 2 MG capsule TAKE 1 CAPSULE BY MOUTH   AS NEEDED FOR DIARRHEA OR LOOSE STOOLS. MAX 6 PER DAY 02/06/21   Magrinat, Gustav C, MD  ?metFORMIN (GLUCOPHAGE-XR) 500 MG 24 hr tablet Take 1 tablet (500 mg total) by mouth in the morning and at bedtime. 07/19/21   Chambliss, Marshall L, MD  ?OXYGEN Inhale into the lungs.    [provider]  ?pregabalin (LYRICA) 25 MG capsule TAKE 1 CAPSULE BY MOUTH 2 TIMES DAILY. 10/07/21   Chambliss, Marshall L, MD  ?Semaglutide, 1 MG/DOSE, (OZEMPIC, 1 MG/DOSE,) 4 MG/3ML SOPN Inject 1 mg into the skin once a week. 09/16/21   Chambliss, Marshall L, MD  ?   ? ?Allergies    ?Meperidine hcl, Penicillins, Amoxicillin, Aspirin, and Percocet [oxycodone-acetaminophen]   ? ?Review of Systems   ?Review of Systems ? ?Physical Exam ?Updated  Vital Signs ?BP (!) 184/94   Pulse 94   Temp 98.7 ?F (37.1 ?C) (Oral)   Resp 20   SpO2 99%  ?Physical Exam ?Constitutional:   ?   General: She is not in acute distress. ?   Appearance: She is obese.  ?HENT:  ?   Head: Normocephalic and atraumatic.  ?Eyes:  ?   Conjunctiva/sclera: Conjunctivae normal.  ?   Pupils: Pupils are equal, round, and reactive to light.  ?Cardiovascular:  ?   Rate and Rhythm: Normal rate and regular rhythm.  ?Pulmonary:  ?   Effort: Pulmonary effort is normal. No respiratory distress.  ?Abdominal:  ?   General: There is no distension.  ?   Tenderness: There is no abdominal tenderness.  ?Skin: ?   General: Skin is warm and dry.  ?   Comments: 2 cm, scabbed wound in the left mid anterior shin, some mild surrounding erythema  ?Neurological:  ?   General: No focal deficit present.  ?   Mental Status: She is alert. Mental status is at baseline.  ?Psychiatric:     ?   Mood and Affect: Mood normal.     ?   Behavior: Behavior normal.  ? ? ?ED Results / Procedures / Treatments   ?Labs ?(all labs ordered are listed, but only abnormal results are displayed) ?Labs Reviewed - No data to display ? ?EKG ?None ? ?Radiology ?No results found. ? ?Procedures ?Procedures  ? ? ?Medications Ordered in ED ?Medications  ?acetaminophen (TYLENOL) tablet 650 mg (650 mg Oral Given 10/20/21 2243)  ?ibuprofen (ADVIL) tablet 600 mg (600 mg Oral Given 10/20/21 2243)  ?doxycycline (VIBRA-TABS) tablet 100 mg (100 mg Oral Given 10/20/21 2243)  ? ? ?ED Course/ Medical Decision Making/ A&P ?  ?                        ?Medical Decision Making ?Amount and/or Complexity of Data Reviewed ?ECG/medicine tests: ordered. ? ?Risk ?OTC drugs. ?Prescription drug management. ? ? ?Small wound noted in the left lower extremity, it is scabbed and appears to be healing but there is some surrounding erythema.  I think is reasonable to do 5 days of Doxycycline for staph coverage. ? ?Left hip pain is reproducible with movement.  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  May also be some referred pain from her lower back.  Overall have a lower suspicion for acute DVT, although it is difficult to exclude based on her habitus and her history of prior DVT in her upper extremity.  I would advise a follow-up image, DVT study ordered for tomorrow, patient given instructions to follow-up and have   imaging completed. ? ?Otherwise we have ordered ibuprofen and Tylenol for her pain.  I reviewed her prior records including her most recent creatinine level which was within normal limits.  She reports she has taken ibuprofen and Motrin for headaches before and does not have any adverse reactions to this.  Likewise for Tylenol - no true allergy. ? ? ? ? ? ? ? ?Final Clinical Impression(s) / ED Diagnoses ?Final diagnoses:  ?Wound of left lower extremity, initial encounter  ?Left hip pain  ? ? ?Rx / DC Orders ?ED Discharge Orders   ? ?      Ordered  ?  doxycycline (VIBRAMYCIN) 100 MG capsule  2 times daily       ? 10/20/21 2233  ?  ibuprofen (ADVIL) 600 MG tablet  Every 8 hours PRN       ? 10/20/21 2233  ?  acetaminophen (TYLENOL) 325 MG tablet  Every 6 hours PRN       ? 10/20/21 2233  ?  LE VENOUS       ? 10/20/21 2234  ? ?  ?  ? ?  ? ? ?  ?Trifan, Matthew J, MD ?10/20/21 2300 ? ?

## 2021-10-20 NOTE — Discharge Instructions (Addendum)
The wound in your leg will likely heal very slowly because of your diabetes.  I decided to prescribe you 4 more days of an antibiotic called doxycycline which will prevent further infection.  Please clean the wound only with clean soap and water, do not use peroxide, do not pull or pinch the scab. ? ?For your hip pain I would recommend that you consider using ibuprofen 600 mg every 8 hours as needed (with food whenever possible), and tylenol 650 mg every 6 hours for the next 5 days.  You should follow-up with your primary care doctor in the meantime.  Your pain is likely coming from a pinched nerve in your back of nerve near your thigh (meralgia paresthetica). ? ?We talked about the very small possibility that there may in fact be a blood clot in your left leg causing this pain.  I ordered you a blood clot ultrasound tomorrow.  Please review the instructions and have circled for where to go [facility] at 11 AM). ?

## 2021-10-21 ENCOUNTER — Other Ambulatory Visit: Payer: Self-pay | Admitting: Family Medicine

## 2021-10-21 ENCOUNTER — Ambulatory Visit (HOSPITAL_COMMUNITY): Admission: RE | Admit: 2021-10-21 | Payer: Medicare Other | Source: Ambulatory Visit

## 2021-10-22 ENCOUNTER — Encounter: Payer: Self-pay | Admitting: Oncology

## 2021-10-23 ENCOUNTER — Other Ambulatory Visit: Payer: Self-pay | Admitting: Oncology

## 2021-10-24 ENCOUNTER — Inpatient Hospital Stay (HOSPITAL_BASED_OUTPATIENT_CLINIC_OR_DEPARTMENT_OTHER): Payer: Medicare Other | Admitting: Adult Health

## 2021-10-24 ENCOUNTER — Inpatient Hospital Stay: Payer: Medicare Other

## 2021-10-24 ENCOUNTER — Encounter: Payer: Self-pay | Admitting: Adult Health

## 2021-10-24 ENCOUNTER — Inpatient Hospital Stay: Payer: Medicare Other | Attending: Oncology

## 2021-10-24 ENCOUNTER — Other Ambulatory Visit: Payer: Self-pay

## 2021-10-24 VITALS — BP 166/74 | HR 65 | Temp 97.7°F | Resp 15 | Wt 311.8 lb

## 2021-10-24 DIAGNOSIS — Z95828 Presence of other vascular implants and grafts: Secondary | ICD-10-CM

## 2021-10-24 DIAGNOSIS — Z170 Estrogen receptor positive status [ER+]: Secondary | ICD-10-CM | POA: Diagnosis not present

## 2021-10-24 DIAGNOSIS — Z5112 Encounter for antineoplastic immunotherapy: Secondary | ICD-10-CM | POA: Diagnosis not present

## 2021-10-24 DIAGNOSIS — E11622 Type 2 diabetes mellitus with other skin ulcer: Secondary | ICD-10-CM | POA: Diagnosis not present

## 2021-10-24 DIAGNOSIS — I10 Essential (primary) hypertension: Secondary | ICD-10-CM | POA: Diagnosis not present

## 2021-10-24 DIAGNOSIS — C50212 Malignant neoplasm of upper-inner quadrant of left female breast: Secondary | ICD-10-CM | POA: Insufficient documentation

## 2021-10-24 DIAGNOSIS — C7800 Secondary malignant neoplasm of unspecified lung: Secondary | ICD-10-CM | POA: Diagnosis not present

## 2021-10-24 DIAGNOSIS — Z8041 Family history of malignant neoplasm of ovary: Secondary | ICD-10-CM | POA: Insufficient documentation

## 2021-10-24 DIAGNOSIS — L97909 Non-pressure chronic ulcer of unspecified part of unspecified lower leg with unspecified severity: Secondary | ICD-10-CM | POA: Diagnosis not present

## 2021-10-24 DIAGNOSIS — Z803 Family history of malignant neoplasm of breast: Secondary | ICD-10-CM | POA: Insufficient documentation

## 2021-10-24 DIAGNOSIS — E1149 Type 2 diabetes mellitus with other diabetic neurological complication: Secondary | ICD-10-CM | POA: Diagnosis not present

## 2021-10-24 DIAGNOSIS — Z87891 Personal history of nicotine dependence: Secondary | ICD-10-CM | POA: Diagnosis not present

## 2021-10-24 DIAGNOSIS — C50919 Malignant neoplasm of unspecified site of unspecified female breast: Secondary | ICD-10-CM

## 2021-10-24 LAB — CMP (CANCER CENTER ONLY)
ALT: 58 U/L — ABNORMAL HIGH (ref 0–44)
AST: 41 U/L (ref 15–41)
Albumin: 3.6 g/dL (ref 3.5–5.0)
Alkaline Phosphatase: 90 U/L (ref 38–126)
Anion gap: 6 (ref 5–15)
BUN: 17 mg/dL (ref 8–23)
CO2: 32 mmol/L (ref 22–32)
Calcium: 9.6 mg/dL (ref 8.9–10.3)
Chloride: 101 mmol/L (ref 98–111)
Creatinine: 0.67 mg/dL (ref 0.44–1.00)
GFR, Estimated: >60 mL/min (ref >60)
Glucose, Bld: 158 mg/dL — ABNORMAL HIGH (ref 70–99)
Potassium: 4.2 mmol/L (ref 3.5–5.1)
Sodium: 139 mmol/L (ref 135–145)
Total Bilirubin: 0.4 mg/dL (ref 0.3–1.2)
Total Protein: 7.1 g/dL (ref 6.5–8.1)

## 2021-10-24 LAB — CBC WITH DIFFERENTIAL (CANCER CENTER ONLY)
Abs Immature Granulocytes: 0.03 10*3/uL (ref 0.00–0.07)
Basophils Absolute: 0 10*3/uL (ref 0.0–0.1)
Basophils Relative: 0 %
Eosinophils Absolute: 0.1 10*3/uL (ref 0.0–0.5)
Eosinophils Relative: 2 %
HCT: 41.6 % (ref 36.0–46.0)
Hemoglobin: 14 g/dL (ref 12.0–15.0)
Immature Granulocytes: 0 %
Lymphocytes Relative: 32 %
Lymphs Abs: 2.2 10*3/uL (ref 0.7–4.0)
MCH: 30 pg (ref 26.0–34.0)
MCHC: 33.7 g/dL (ref 30.0–36.0)
MCV: 89.3 fL (ref 80.0–100.0)
Monocytes Absolute: 0.5 10*3/uL (ref 0.1–1.0)
Monocytes Relative: 7 %
Neutro Abs: 4.1 10*3/uL (ref 1.7–7.7)
Neutrophils Relative %: 59 %
Platelet Count: 223 10*3/uL (ref 150–400)
RBC: 4.66 MIL/uL (ref 3.87–5.11)
RDW: 14 % (ref 11.5–15.5)
WBC Count: 6.9 10*3/uL (ref 4.0–10.5)
nRBC: 0 % (ref 0.0–0.2)

## 2021-10-24 MED ORDER — SODIUM CHLORIDE 0.9% FLUSH
10.0000 mL | Freq: Once | INTRAVENOUS | Status: AC
  Administered 2021-10-24: 13:00:00 10 mL

## 2021-10-24 MED ORDER — HEPARIN SOD (PORK) LOCK FLUSH 100 UNIT/ML IV SOLN
500.0000 [IU] | Freq: Once | INTRAVENOUS | Status: AC | PRN
  Administered 2021-10-24: 16:00:00 500 [IU]

## 2021-10-24 MED ORDER — SODIUM CHLORIDE 0.9 % IV SOLN: Freq: Once | INTRAVENOUS | Status: AC

## 2021-10-24 MED ORDER — LORAZEPAM 2 MG/ML IJ SOLN
0.5000 mg | Freq: Once | INTRAMUSCULAR | Status: AC
  Administered 2021-10-24: 14:00:00 0.5 mg via INTRAVENOUS
  Filled 2021-10-24: qty 1

## 2021-10-24 MED ORDER — DIPHENHYDRAMINE HCL 25 MG PO CAPS
25.0000 mg | ORAL_CAPSULE | Freq: Once | ORAL | Status: AC
  Administered 2021-10-24: 14:00:00 25 mg via ORAL
  Filled 2021-10-24: qty 1

## 2021-10-24 MED ORDER — ACETAMINOPHEN 325 MG PO TABS
650.0000 mg | ORAL_TABLET | Freq: Once | ORAL | Status: AC
  Administered 2021-10-24: 14:00:00 650 mg via ORAL
  Filled 2021-10-24: qty 2

## 2021-10-24 MED ORDER — SODIUM CHLORIDE 0.9% FLUSH
10.0000 mL | Freq: Once | INTRAVENOUS | Status: AC
  Administered 2021-10-24: 14:00:00 10 mL via INTRAVENOUS

## 2021-10-24 MED ORDER — TRASTUZUMAB-ANNS CHEMO 150 MG IV SOLR
750.0000 mg | Freq: Once | INTRAVENOUS | Status: AC
  Administered 2021-10-24: 15:00:00 750 mg via INTRAVENOUS
  Filled 2021-10-24: qty 35.72

## 2021-10-24 NOTE — Patient Instructions (Signed)
[facility]  Discharge Instructions: ?Thank you for choosing [facility] to provide your oncology and hematology care.  ? ?If you have a lab appointment with the [facility], please go directly to the [facility] and check in at the registration area. ?  ?Wear comfortable clothing and clothing appropriate for easy access to any Portacath or PICC line.  ? ?We strive to give you quality time with your provider. You may need to reschedule your appointment if you arrive late (15 or more minutes).  Arriving late affects you and other patients whose appointments are after yours.  Also, if you miss three or more appointments without notifying the office, you may be dismissed from the clinic at the provider?s discretion.    ?  ?For prescription refill requests, have your pharmacy contact our office and allow 72 hours for refills to be completed.   ? ?Today you received the following chemotherapy and/or immunotherapy agents: Kanjinti ?  ?To help prevent nausea and vomiting after your treatment, we encourage you to take your nausea medication as directed. ? ?BELOW ARE SYMPTOMS THAT SHOULD BE REPORTED IMMEDIATELY: ?*FEVER GREATER THAN 100.4 F (38 ?C) OR HIGHER ?*CHILLS OR SWEATING ?*NAUSEA AND VOMITING THAT IS NOT CONTROLLED WITH YOUR NAUSEA MEDICATION ?*UNUSUAL SHORTNESS OF BREATH ?*UNUSUAL BRUISING OR BLEEDING ?*URINARY PROBLEMS (pain or burning when urinating, or frequent urination) ?*BOWEL PROBLEMS (unusual diarrhea, constipation, pain near the anus) ?TENDERNESS IN MOUTH AND THROAT WITH OR WITHOUT PRESENCE OF ULCERS (sore throat, sores in mouth, or a toothache) ?UNUSUAL RASH, SWELLING OR PAIN  ?UNUSUAL VAGINAL DISCHARGE OR ITCHING  ? ?Items with * indicate a potential emergency and should be followed up as soon as possible or go to the Emergency Department if any problems should occur. ? ?Please show the CHEMOTHERAPY ALERT CARD or IMMUNOTHERAPY ALERT CARD at check-in to the  Emergency Department and triage nurse. ? ?Should you have questions after your visit or need to cancel or reschedule your appointment, please contact [facility]  Dept: [phone]  and follow the prompts.  Office hours are 8:00 a.m. to 4:30 p.m. Monday - Friday. Please note that voicemails left after 4:00 p.m. may not be returned until the following business day.  We are closed weekends and major holidays. You have access to a nurse at all times for urgent questions. Please call the main number to the clinic Dept: [phone] and follow the prompts. ? ? ?For any non-urgent questions, you may also contact your provider using MyChart. We now offer e-Visits for anyone 38 and older to request care online for non-urgent symptoms. For details visit [email or web address]. ?  ?Also download the MyChart app! Go to the app store, search "MyChart", open the app, select [facility], and log in with your MyChart username and password. ? ?Due to Covid, a mask is required upon entering the hospital/clinic. If you do not have a mask, one will be given to you upon arrival. For doctor visits, patients may have 1 support person aged 16 or older with them. For treatment visits, patients cannot have anyone with them due to current Covid guidelines and our immunocompromised population.  ? ?

## 2021-10-24 NOTE — Assessment & Plan Note (Signed)
[name] is a 71 year old woman with metastatic breast cancer to the lung noted in April 2014 (see oncologic history above). ? ?1.  Metastatic breast cancer: She has no clinical or radiographic signs of progression of her metastatic breast cancer.  We reviewed her scans from February 24 which confirm that there is no progression in her adenopathy and lung nodules are stable.  I reviewed this with him in detail and she was very grateful to hear this news because she was unsure when she was reading the test on MyChart what they meant.  She undergoes echocardiograms every 6 months to evaluate for cardiotoxicity from the Herceptin she receives every 4 weeks.  This will be due again in June.  I am unclear what the conversation was about the letrozole therefore I will leave it for Dr. Chryl Heck to discuss at [facility] next appointment with her. ? ?2.  Diabetic ulcer: I reviewed with [name] that she was supposed to have seen Dr. Erin Hearing to follow-up a few days after her ER visit.  That did not happen and she told me that it was unclear because she had felt so poorly.  The area is doing better however I suggested she reach out to his office to see if he needed to see her at this point. ? ?[name] will return in 4 weeks for labs, follow-up with Dr. Chryl Heck, and her next Herceptin.  I congratulated her on the improved control of her blood sugars. ?

## 2021-10-24 NOTE — Progress Notes (Signed)
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 [facility]) Staging form: Breast, AJCC 7th Edition - Clinical: Stage IV ([name], NX, M1) - Signed by Chauncey Cruel, MD on 04/17/2015  Malignant neoplasm of upper-inner quadrant of left breast in female, estrogen receptor positive ([name]) Staging form: Breast, AJCC 7th Edition - Clinical: Stage IA (T1c, N0, cM0) - Unsigned Specimen type: Core Needle Biopsy Histopathologic type: 9931 Laterality: Left Staging comments: Staged at breast conference 4.2.14  - Pathologic: Stage IV (M1) - Unsigned Specimen type: Core Needle Biopsy Histopathologic type: 9931 Laterality: Left   SUMMARY OF ONCOLOGIC HISTORY:  (1) s/p left breast upper inner quadrant biopsy 11/05/2012 for a clinical T1c NX M1, stage IV invasive ductal carcinoma, grade 3, estrogen and progesterone receptor negative, with an MIB-1 of 77%, and HER-2 amplified by CISH with a ratio of 4.39.             (a) mammography 04/16/2016 shows the left breast mass to have nearly completely resolved   (2) chest, abdomen and pelvis CT scans and PET scan April 2014 showed multiple bilateral pulmonaru nodules but no liver or bone involvement; biopsy of a pulmonary nodule on 11/30/2012 confirmed metastatic breast cancer.              (a) CT in GI obtained 09/28/2014 shows no measurable disease in the lungs             (b) chest CT 12/20/2015 showed stable small right lung pulmonary nodules and an area in the right lower lobe pleural parenchymal thickness requiring attention in future studies              (3) received docetaxel / trastuzumab/ pertuzumab x4, completed 02/07/2013, with a good response,    (4) trastuzumab/ pertuzumab continued every 21 days;             (a) Pertuzumab discontinued after 07/28/2016, and Trastuzumab given every 4 weeks starting 08/2016              (b) most recent echocardiogram 06/28/2018 shows EF of 60-65% (these will be every 6 months)   (5) anastrozole started 02/15/2013, discontinued October 2014 with poor tolerance   (6) 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   CURRENT THERAPY: Herceptin every 4 weeks, letrozole  INTERVAL HISTORY: [name] 71 y.o. female returns for follow-up of her metastatic breast cancer.  Her most recent CT scan was unchanged.  She tells me that she was taken off of letrozole.  She continues on Herceptin every 4 weeks and says she is tolerating it well.  She undergoes echocardiogram every 6 months.  She was recently seen in the ER for leg pain that turned out to be a diabetic ulcer.  This is improving.  She says her blood sugar and A1c have improved considerably.  She looks forward to her follow-up with Dr. Barbra Sarks this and she tells me its not for another month and she did not realize that she needed one following her ER visit.   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  MEDICAL HISTORY: Past Medical  History:  Diagnosis Date   Arthritis    Back pain    Breast cancer ([place]) dx'd 11/2012   left   Chest pain    COPD (chronic obstructive pulmonary disease) (HCC)    Diabetes mellitus without complication ([place]) 0/94/7096   Ear pain    Fatty liver 6/03   Hypertension    Lung disease    Lung metastases ([place]) dx'd 11/2012   Obesity, unspecified    Other abnormal glucose    Suicide attempt ([place]) 1996   Syncope and collapse    Unspecified sleep apnea     SURGICAL HISTORY: Past Surgical History:  Procedure Laterality Date   CARDIAC CATHETERIZATION     2007   CHOLECYSTECTOMY     TUBAL LIGATION      SOCIAL HISTORY: Social History   Socioeconomic History   Marital status: Divorced    Spouse name: Not on file   Number of children: 4   Years of education: some colle   Highest education level: Not on file  Occupational History   Occupation: Disability   Occupation: retired-daycare  Tobacco Use   Smoking status: Former    Packs/day: 3.00    Years: 5.00    Pack years: 15.00    Types: Cigarettes    Quit date: 08/18/1968    Years since quitting: 53.2   Smokeless tobacco: Never  Vaping Use   Vaping Use: Never used  Substance and Sexual Activity   Alcohol use: No    Alcohol/week: 0.0 standard drinks   Drug use: No   Sexual activity: Not Currently  Other Topics Concern   Not on file  Social History Narrative   Health Care POA:    Emergency Contact: Thomasenia Bottoms [phone]   End of Life Plan:    Who lives with you: two grandchildren, friend, daughter   Any pets: 3 poodles   Diet: Pt has a variety of protein, starch, vegetables.  Pt is currently working on cutting back on portions for weight loss.   Exercise: Pt has not regular exercise routine.  Occasionally walks around home.   Seatbelts: Pt reports wearing seatbelt occasionally.   Sun Exposure/Protection: Pt does not use sun protection   Hobbies: reading, playing on kindle, ebay         Currently in her home she keeps her  granddaughter [name], 67, who is the daughter of the patient's daughter [name] (the patient refers to [name] as "my adopted daughter"); grandson Denunzio "[name]" Segoviano, [place], who is [name]'s half-brother; [name], and an Dominica friend, [name], the patient's significant other. [name] is a Network engineer, currently unemployed. Son [name] works as an Clinical biochemist in [place]. Daughter [name] is currently in prison due to killing someone in a car accident. Daughter [name] died from aplastic anemia at the age of 66. The patient has a total of 4 grandchildren. She  is not a church attender   Social Determinants of Radio broadcast assistant Strain: Not on file  Food Insecurity: Not on file  Transportation Needs: Not on file  Physical Activity: Not on file  Stress: Not on file  Social Connections: Not on file  Intimate Partner Violence: Not on file    FAMILY HISTORY: Family History  Problem Relation Age of Onset   Coronary artery disease Father 63   Diabetes Father    Heart disease Father    Breast cancer Mother 56   Cancer Mother 19       breast   Aplastic anemia Daughter        died at age 38   Cancer Maternal Aunt 17       ovarian   Cancer Maternal Grandmother 75       ovarian   Cancer Paternal Aunt 62       ovarian/breast/breast   Coronary artery disease Sister 77   Coronary artery disease Brother 30    Review of Systems  Constitutional:  Negative for appetite change, chills, fatigue, fever and unexpected weight change.  HENT:   Negative for hearing loss, lump/mass and trouble swallowing.   Eyes:  Negative for eye problems and icterus.  Respiratory:  Negative for chest tightness, cough and shortness of breath.   Cardiovascular:  Negative for chest pain, leg swelling and palpitations.  Gastrointestinal:  Negative for abdominal distention, abdominal pain, constipation, diarrhea, nausea and vomiting.  Endocrine:  Negative for hot flashes.  Genitourinary:  Negative for difficulty urinating.   Musculoskeletal:  Negative for arthralgias.  Skin:  Negative for itching and rash.  Neurological:  Negative for dizziness, extremity weakness, headaches and numbness.  Hematological:  Negative for adenopathy. Does not bruise/bleed easily.  Psychiatric/Behavioral:  Negative for depression. The patient is not nervous/anxious.      PHYSICAL EXAMINATION  ECOG PERFORMANCE STATUS: 2 - Symptomatic, <50% confined to bed  Vitals:   10/24/21 1323  BP: (!) 166/74  Pulse: 65  Resp: 15  Temp: 97.7 F (36.5 C)  SpO2: 99%    Physical Exam Constitutional:      General: She is not in acute distress.    Appearance: Normal appearance. She is not toxic-appearing.  HENT:     Head: Normocephalic and atraumatic.  Eyes:     General: No scleral icterus. Cardiovascular:     Rate and Rhythm: Normal rate and regular rhythm.     Pulses: Normal pulses.     Heart sounds: Normal heart sounds.  Pulmonary:     Effort: Pulmonary effort is normal.     Breath sounds: Normal breath sounds.  Abdominal:     General: Abdomen is flat. Bowel sounds are normal. There is no distension.     Palpations: Abdomen is soft.     Tenderness: There is no abdominal tenderness.  Musculoskeletal:        General: No swelling.     Cervical back: Neck supple.  Lymphadenopathy:     Cervical: No cervical adenopathy.  Skin:    General: Skin is warm and dry.     Findings: No rash.  Neurological:     General: No focal deficit present.     Mental Status: She is alert.  Psychiatric:        Mood and Affect: Mood normal.        Behavior: Behavior normal.    LABORATORY DATA:  CBC    Component Value Date/Time   WBC 6.9 10/24/2021  1303   WBC 8.0 05/10/2020 0447   RBC 4.66 10/24/2021 1303   HGB 14.0 10/24/2021 1303   HGB 12.8 08/05/2017 0927   HCT 41.6 10/24/2021 1303   HCT 39.8 08/05/2017 0927   PLT 223 10/24/2021 1303   PLT 267  08/05/2017 0927   MCV 89.3 10/24/2021 1303   MCV 86.7 08/05/2017 0927   MCH 30.0 10/24/2021 1303   MCHC 33.7 10/24/2021 1303   RDW 14.0 10/24/2021 1303   RDW 14.3 08/05/2017 0927   LYMPHSABS 2.2 10/24/2021 1303   LYMPHSABS 2.3 08/05/2017 0927   MONOABS 0.5 10/24/2021 1303   MONOABS 0.4 08/05/2017 0927   EOSABS 0.1 10/24/2021 1303   EOSABS 0.1 08/05/2017 0927   BASOSABS 0.0 10/24/2021 1303   BASOSABS 0.0 08/05/2017 0927    CMP     Component Value Date/Time   NA 139 10/24/2021 1303   NA 141 08/05/2017 0927   K 4.2 10/24/2021 1303   K 4.0 08/05/2017 0927   CL 101 10/24/2021 1303   CL 101 02/07/2013 1125   CO2 32 10/24/2021 1303   CO2 31 (H) 08/05/2017 0927   GLUCOSE 158 (H) 10/24/2021 1303   GLUCOSE 125 08/05/2017 0927   GLUCOSE 193 (H) 02/07/2013 1125   BUN 17 10/24/2021 1303   BUN 22.7 08/05/2017 0927   CREATININE 0.67 10/24/2021 1303   CREATININE 0.8 08/05/2017 0927   CALCIUM 9.6 10/24/2021 1303   CALCIUM 9.7 08/05/2017 0927   PROT 7.1 10/24/2021 1303   PROT 7.3 08/05/2017 0927   ALBUMIN 3.6 10/24/2021 1303   ALBUMIN 3.2 (L) 08/05/2017 0927   AST 41 10/24/2021 1303   AST 19 08/05/2017 0927   ALT 58 (H) 10/24/2021 1303   ALT 28 08/05/2017 0927   ALKPHOS 90 10/24/2021 1303   ALKPHOS 108 08/05/2017 0927   BILITOT 0.4 10/24/2021 1303   BILITOT 0.34 08/05/2017 0927   GFRNONAA >60 10/24/2021 1303   GFRAA >60 05/10/2020 0447   GFRAA >60 04/18/2020 1023     ASSESSMENT and THERAPY PLAN:   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  1.  Metastatic breast cancer: She has no clinical or radiographic signs of progression of her metastatic breast cancer.  We reviewed her scans from February 24 which confirm that there is no progression in her adenopathy and lung nodules are stable.  I reviewed this with him in detail and she was very grateful to hear  this news because she was unsure when she was reading the test on MyChart what they meant.  She undergoes echocardiograms every 6 months to evaluate for cardiotoxicity from the Herceptin she receives every 4 weeks.  This will be due again in June.  I am unclear what the conversation was about the letrozole therefore I will leave it for Dr. Chryl Heck to discuss at [facility] next appointment with her.  2.  Diabetic ulcer: I reviewed with Kinze that she was supposed to have seen Dr. Erin Hearing to follow-up a few days after her ER visit.  That did not happen and she told me that it was unclear because she had felt so poorly.  The area is doing better however I suggested she reach out to his office to see if he needed to see her at this point.  Angelyn will return in 4 weeks for labs, follow-up with Dr. Chryl Heck, and her next Herceptin.  I congratulated  her on the improved control of her blood sugars.   All questions were answered. The patient knows to call the clinic with any problems, questions or concerns. We can certainly see the patient much sooner if necessary.  Total encounter time: 30 minutes in face-to-face visit time, chart review, lab review, care coordination, order entry, and documentation of the encounter.  [name], NP 10/24/21 3:39 PM Medical Oncology and Hematology [facility] [place],  [address] Tel. [phone]    Fax. [phone]  *Total Encounter Time as defined by the Centers for Medicare and Medicaid Services includes, in addition to the face-to-face time of a patient visit (documented in the note above) non-face-to-face time: obtaining and reviewing outside history, ordering and reviewing medications, tests or procedures, care coordination (communications with other health care professionals or caregivers) and documentation in the medical record.

## 2021-11-03 ENCOUNTER — Other Ambulatory Visit: Payer: Self-pay | Admitting: Family Medicine

## 2021-11-07 DIAGNOSIS — U071 COVID-19: Secondary | ICD-10-CM | POA: Diagnosis not present

## 2021-11-07 DIAGNOSIS — I5020 Unspecified systolic (congestive) heart failure: Secondary | ICD-10-CM | POA: Diagnosis not present

## 2021-11-07 DIAGNOSIS — M25569 Pain in unspecified knee: Secondary | ICD-10-CM | POA: Diagnosis not present

## 2021-11-07 DIAGNOSIS — R062 Wheezing: Secondary | ICD-10-CM | POA: Diagnosis not present

## 2021-11-08 DIAGNOSIS — M25569 Pain in unspecified knee: Secondary | ICD-10-CM | POA: Diagnosis not present

## 2021-11-08 DIAGNOSIS — U071 COVID-19: Secondary | ICD-10-CM | POA: Diagnosis not present

## 2021-11-08 DIAGNOSIS — I5020 Unspecified systolic (congestive) heart failure: Secondary | ICD-10-CM | POA: Diagnosis not present

## 2021-11-08 DIAGNOSIS — R062 Wheezing: Secondary | ICD-10-CM | POA: Diagnosis not present

## 2021-11-21 ENCOUNTER — Other Ambulatory Visit: Payer: Self-pay

## 2021-11-21 ENCOUNTER — Inpatient Hospital Stay: Payer: Medicare Other

## 2021-11-21 ENCOUNTER — Inpatient Hospital Stay: Payer: Medicare Other | Attending: Oncology

## 2021-11-21 VITALS — BP 164/66 | HR 66 | Temp 98.6°F | Resp 20 | Wt 312.0 lb

## 2021-11-21 DIAGNOSIS — Z170 Estrogen receptor positive status [ER+]: Secondary | ICD-10-CM | POA: Insufficient documentation

## 2021-11-21 DIAGNOSIS — C50212 Malignant neoplasm of upper-inner quadrant of left female breast: Secondary | ICD-10-CM | POA: Insufficient documentation

## 2021-11-21 DIAGNOSIS — Z95828 Presence of other vascular implants and grafts: Secondary | ICD-10-CM

## 2021-11-21 DIAGNOSIS — Z5112 Encounter for antineoplastic immunotherapy: Secondary | ICD-10-CM | POA: Insufficient documentation

## 2021-11-21 LAB — CMP (CANCER CENTER ONLY)
ALT: 60 U/L — ABNORMAL HIGH (ref 0–44)
AST: 49 U/L — ABNORMAL HIGH (ref 15–41)
Albumin: 3.5 g/dL (ref 3.5–5.0)
Alkaline Phosphatase: 93 U/L (ref 38–126)
Anion gap: 6 (ref 5–15)
BUN: 14 mg/dL (ref 8–23)
CO2: 32 mmol/L (ref 22–32)
Calcium: 9.5 mg/dL (ref 8.9–10.3)
Chloride: 102 mmol/L (ref 98–111)
Creatinine: 0.66 mg/dL (ref 0.44–1.00)
GFR, Estimated: >60 mL/min (ref >60)
Glucose, Bld: 123 mg/dL — ABNORMAL HIGH (ref 70–99)
Potassium: 4.1 mmol/L (ref 3.5–5.1)
Sodium: 140 mmol/L (ref 135–145)
Total Bilirubin: 0.4 mg/dL (ref 0.3–1.2)
Total Protein: 7.2 g/dL (ref 6.5–8.1)

## 2021-11-21 LAB — CBC WITH DIFFERENTIAL (CANCER CENTER ONLY)
Abs Immature Granulocytes: 0.01 10*3/uL (ref 0.00–0.07)
Basophils Absolute: 0 10*3/uL (ref 0.0–0.1)
Basophils Relative: 0 %
Eosinophils Absolute: 0.2 10*3/uL (ref 0.0–0.5)
Eosinophils Relative: 2 %
HCT: 40.9 % (ref 36.0–46.0)
Hemoglobin: 13.5 g/dL (ref 12.0–15.0)
Immature Granulocytes: 0 %
Lymphocytes Relative: 31 %
Lymphs Abs: 2.3 10*3/uL (ref 0.7–4.0)
MCH: 29.7 pg (ref 26.0–34.0)
MCHC: 33 g/dL (ref 30.0–36.0)
MCV: 89.9 fL (ref 80.0–100.0)
Monocytes Absolute: 0.5 10*3/uL (ref 0.1–1.0)
Monocytes Relative: 7 %
Neutro Abs: 4.3 10*3/uL (ref 1.7–7.7)
Neutrophils Relative %: 60 %
Platelet Count: 223 10*3/uL (ref 150–400)
RBC: 4.55 MIL/uL (ref 3.87–5.11)
RDW: 13.9 % (ref 11.5–15.5)
WBC Count: 7.3 10*3/uL (ref 4.0–10.5)
nRBC: 0 % (ref 0.0–0.2)

## 2021-11-21 MED ORDER — LORAZEPAM 2 MG/ML IJ SOLN
0.5000 mg | Freq: Once | INTRAMUSCULAR | Status: AC
  Administered 2021-11-21: 0.5 mg via INTRAVENOUS
  Filled 2021-11-21: qty 1

## 2021-11-21 MED ORDER — HEPARIN SOD (PORK) LOCK FLUSH 100 UNIT/ML IV SOLN
500.0000 [IU] | Freq: Once | INTRAVENOUS | Status: AC | PRN
  Administered 2021-11-21: 500 [IU]

## 2021-11-21 MED ORDER — SODIUM CHLORIDE 0.9% FLUSH
10.0000 mL | Freq: Once | INTRAVENOUS | Status: AC
  Administered 2021-11-21: 10 mL

## 2021-11-21 MED ORDER — ACETAMINOPHEN 325 MG PO TABS
650.0000 mg | ORAL_TABLET | Freq: Once | ORAL | Status: AC
  Administered 2021-11-21: 650 mg via ORAL
  Filled 2021-11-21: qty 2

## 2021-11-21 MED ORDER — DIPHENHYDRAMINE HCL 25 MG PO CAPS
25.0000 mg | ORAL_CAPSULE | Freq: Once | ORAL | Status: AC
  Administered 2021-11-21: 25 mg via ORAL
  Filled 2021-11-21: qty 1

## 2021-11-21 MED ORDER — SODIUM CHLORIDE 0.9% FLUSH
10.0000 mL | Freq: Once | INTRAVENOUS | Status: AC
  Administered 2021-11-21: 10 mL via INTRAVENOUS

## 2021-11-21 MED ORDER — SODIUM CHLORIDE 0.9 % IV SOLN: Freq: Once | INTRAVENOUS | Status: AC

## 2021-11-21 MED ORDER — TRASTUZUMAB-ANNS CHEMO 150 MG IV SOLR
750.0000 mg | Freq: Once | INTRAVENOUS | Status: AC
  Administered 2021-11-21: 750 mg via INTRAVENOUS
  Filled 2021-11-21: qty 35.72

## 2021-11-21 NOTE — Patient Instructions (Signed)
[facility]  Discharge Instructions: ?Thank you for choosing [facility] to provide your oncology and hematology care.  ? ?If you have a lab appointment with the [facility], please go directly to the [facility] and check in at the registration area. ?  ?Wear comfortable clothing and clothing appropriate for easy access to any Portacath or PICC line.  ? ?We strive to give you quality time with your provider. You may need to reschedule your appointment if you arrive late (15 or more minutes).  Arriving late affects you and other patients whose appointments are after yours.  Also, if you miss three or more appointments without notifying the office, you may be dismissed from the clinic at the provider?s discretion.    ?  ?For prescription refill requests, have your pharmacy contact our office and allow 72 hours for refills to be completed.   ? ?Today you received the following chemotherapy and/or immunotherapy agents: Kanjinti ?  ?To help prevent nausea and vomiting after your treatment, we encourage you to take your nausea medication as directed. ? ?BELOW ARE SYMPTOMS THAT SHOULD BE REPORTED IMMEDIATELY: ?*FEVER GREATER THAN 100.4 F (38 ?C) OR HIGHER ?*CHILLS OR SWEATING ?*NAUSEA AND VOMITING THAT IS NOT CONTROLLED WITH YOUR NAUSEA MEDICATION ?*UNUSUAL SHORTNESS OF BREATH ?*UNUSUAL BRUISING OR BLEEDING ?*URINARY PROBLEMS (pain or burning when urinating, or frequent urination) ?*BOWEL PROBLEMS (unusual diarrhea, constipation, pain near the anus) ?TENDERNESS IN MOUTH AND THROAT WITH OR WITHOUT PRESENCE OF ULCERS (sore throat, sores in mouth, or a toothache) ?UNUSUAL RASH, SWELLING OR PAIN  ?UNUSUAL VAGINAL DISCHARGE OR ITCHING  ? ?Items with * indicate a potential emergency and should be followed up as soon as possible or go to the Emergency Department if any problems should occur. ? ?Please show the CHEMOTHERAPY ALERT CARD or IMMUNOTHERAPY ALERT CARD at check-in to the  Emergency Department and triage nurse. ? ?Should you have questions after your visit or need to cancel or reschedule your appointment, please contact [facility]  Dept: [phone]  and follow the prompts.  Office hours are 8:00 a.m. to 4:30 p.m. Monday - Friday. Please note that voicemails left after 4:00 p.m. may not be returned until the following business day.  We are closed weekends and major holidays. You have access to a nurse at all times for urgent questions. Please call the main number to the clinic Dept: [phone] and follow the prompts. ? ? ?For any non-urgent questions, you may also contact your provider using MyChart. We now offer e-Visits for anyone 64 and older to request care online for non-urgent symptoms. For details visit [email or web address]. ?  ?Also download the MyChart app! Go to the app store, search "MyChart", open the app, select [facility], and log in with your MyChart username and password. ? ?Due to Covid, a mask is required upon entering the hospital/clinic. If you do not have a mask, one will be given to you upon arrival. For doctor visits, patients may have 1 support person aged 78 or older with them. For treatment visits, patients cannot have anyone with them due to current Covid guidelines and our immunocompromised population.  ? ?

## 2021-11-24 DIAGNOSIS — E1149 Type 2 diabetes mellitus with other diabetic neurological complication: Secondary | ICD-10-CM | POA: Diagnosis not present

## 2021-11-25 ENCOUNTER — Other Ambulatory Visit: Payer: Self-pay | Admitting: Family Medicine

## 2021-11-26 ENCOUNTER — Other Ambulatory Visit: Payer: Self-pay | Admitting: *Deleted

## 2021-11-26 MED ORDER — KETOCONAZOLE 2 % EX CREA: 1.0000 "application " | TOPICAL_CREAM | Freq: Every day | CUTANEOUS | 0 refills | Status: DC

## 2021-11-26 MED ORDER — LOPERAMIDE HCL 2 MG PO CAPS: 2.0000 mg | ORAL_CAPSULE | ORAL | 1 refills | Status: DC | PRN

## 2021-11-27 ENCOUNTER — Other Ambulatory Visit: Payer: Self-pay

## 2021-11-27 ENCOUNTER — Encounter: Payer: Self-pay | Admitting: Family Medicine

## 2021-11-27 ENCOUNTER — Telehealth: Payer: Self-pay | Admitting: *Deleted

## 2021-11-27 ENCOUNTER — Ambulatory Visit (INDEPENDENT_AMBULATORY_CARE_PROVIDER_SITE_OTHER): Payer: Medicare Other | Admitting: Family Medicine

## 2021-11-27 VITALS — BP 143/60 | HR 64 | Wt 312.0 lb

## 2021-11-27 DIAGNOSIS — I10 Essential (primary) hypertension: Secondary | ICD-10-CM | POA: Diagnosis not present

## 2021-11-27 DIAGNOSIS — M48061 Spinal stenosis, lumbar region without neurogenic claudication: Secondary | ICD-10-CM

## 2021-11-27 DIAGNOSIS — E1149 Type 2 diabetes mellitus with other diabetic neurological complication: Secondary | ICD-10-CM | POA: Diagnosis not present

## 2021-11-27 LAB — POCT GLYCOSYLATED HEMOGLOBIN (HGB A1C): HbA1c, POC (controlled diabetic range): 5.8 % (ref 0.0–7.0)

## 2021-11-27 MED ORDER — ONDANSETRON HCL 4 MG PO TABS: 4.0000 mg | ORAL_TABLET | Freq: Three times a day (TID) | ORAL | 1 refills | Status: DC | PRN

## 2021-11-27 NOTE — Patient Instructions (Addendum)
Good to see you today - Thank you for coming in ? ?Things we discussed today: ? ?Stop the metformin ? ?Cut down Lantus Long Acting - 32 units ? ?Keep ozempic at same dose ? ?Try to get to 32 steps ? ?Please always bring your medication bottles ? ?Come back to see me in 3 months  ?

## 2021-11-27 NOTE — Assessment & Plan Note (Signed)
A1c of 5.8.  Is eating frequent snacks when feels her blood sugar might be too low although not less than 70 on her CBGM.   Not losing weight on Ozempic which she feels is giving her a lot of GI side effects ?Decided to stop metformin and continue ozempic and decrease her lantus.  Perhaps is not losing weight because is taking frequent snacks.  Safer A1c might be mid 7s  ?

## 2021-11-27 NOTE — Addendum Note (Signed)
Addended by: Talbert Cage L on: 11/27/2021 12:25 PM ? ? Modules accepted: Orders ? ?

## 2021-11-27 NOTE — Progress Notes (Signed)
? ? ?  SUBJECTIVE:  ? ?CHIEF COMPLAINT / HPI:  ? ?Diabetes ?Lab Results  ?Component Value Date  ? HGBA1C 5.8 11/27/2021  ?Sometimes her blood sugar are around 70-80 and she eats a snack.  Other wise taking her medications as listed. She feels the ozempic and the metformin are causing her stomach upset  ? ?Diabetic ulcer:  ?Was on her L lower anterior leg.  Has healed in completely and is just slightly dark without any pain or discharge  ? ?Hypertension ?Not taking any medications.  Has her usual back pain today  ? ?Obesity  ? ?LUMBAR SPINAL STENOSIS ?Has been able to ambulate 28 steps prior was 23. No new weakness or pain  Takes lyrica regularly  ? ?PERTINENT  PMH / PSH: sees oncology regularly ? ?OBJECTIVE:  ? ?BP (!) 143/60   Pulse 64   Wt (!) 312 lb (141.5 kg)   SpO2 98%   BMI 53.55 kg/m?   ?Alert mild distress sittiing in WC ?Lungs:  Normal respiratory effort, chest expands symmetrically. Lungs are clear to auscultation, no crackles or wheezes. ?Heart - Regular rate and rhythm.  No murmurs, gallops or rubs.    ?L lower leg - well healed shin ulcer ? ? ?ASSESSMENT/PLAN:  ? ?HYPERTENSION, BENIGN SYSTEMIC ?BP Readings from Last 3 Encounters:  ?11/27/21 (!) 143/60  ?11/21/21 (!) 164/66  ?10/24/21 (!) 166/74  ? ?Elevated mildly today. Consider low dose ARB if persists.  Did not want to start today given her GI complaints with her diabetes medications  ? ?Diabetes mellitus type 2 with neurological manifestations ([facility] [facility]) ?A1c of 5.8.  Is eating frequent snacks when feels her blood sugar might be too low although not less than 70 on her CBGM.   Not losing weight on Ozempic which she feels is giving her a lot of GI side effects ?Decided to stop metformin and continue ozempic and decrease her lantus.  Perhaps is not losing weight because is taking frequent snacks.  Safer A1c might be mid 7s  ? ?LUMBAR SPINAL STENOSIS ?Stable Challenged to get to 32 steps by next visit in 3 months ?  ? ? ?[name], MD ?[facility]  ?

## 2021-11-27 NOTE — Telephone Encounter (Signed)
-----   Message from [name], MD sent at 11/27/2021 12:25 PM EDT ----- ?Regarding: DME rolling walker ?Put in order.  Hopefully correctly  ? ?Thanks ?[name]  ? ?

## 2021-11-27 NOTE — Assessment & Plan Note (Signed)
Stable Challenged to get to 32 steps by next visit in 3 months ?

## 2021-11-27 NOTE — Telephone Encounter (Signed)
Message sent to [employer] for DME order.  [name],CMA ? ?

## 2021-11-27 NOTE — Assessment & Plan Note (Signed)
BP Readings from Last 3 Encounters:  ?11/27/21 (!) 143/60  ?11/21/21 (!) 164/66  ?10/24/21 (!) 166/74  ? ?Elevated mildly today. Consider low dose ARB if persists.  Did not want to start today given her GI complaints with her diabetes medications  ?

## 2021-12-08 DIAGNOSIS — R062 Wheezing: Secondary | ICD-10-CM | POA: Diagnosis not present

## 2021-12-08 DIAGNOSIS — I5020 Unspecified systolic (congestive) heart failure: Secondary | ICD-10-CM | POA: Diagnosis not present

## 2021-12-08 DIAGNOSIS — U071 COVID-19: Secondary | ICD-10-CM | POA: Diagnosis not present

## 2021-12-08 DIAGNOSIS — M25569 Pain in unspecified knee: Secondary | ICD-10-CM | POA: Diagnosis not present

## 2021-12-09 DIAGNOSIS — M25569 Pain in unspecified knee: Secondary | ICD-10-CM | POA: Diagnosis not present

## 2021-12-09 DIAGNOSIS — I5020 Unspecified systolic (congestive) heart failure: Secondary | ICD-10-CM | POA: Diagnosis not present

## 2021-12-09 DIAGNOSIS — R062 Wheezing: Secondary | ICD-10-CM | POA: Diagnosis not present

## 2021-12-09 DIAGNOSIS — U071 COVID-19: Secondary | ICD-10-CM | POA: Diagnosis not present

## 2021-12-12 ENCOUNTER — Other Ambulatory Visit: Payer: Self-pay | Admitting: Family Medicine

## 2021-12-16 ENCOUNTER — Other Ambulatory Visit: Payer: Self-pay | Admitting: Family Medicine

## 2021-12-19 ENCOUNTER — Other Ambulatory Visit: Payer: Self-pay | Admitting: *Deleted

## 2021-12-19 ENCOUNTER — Inpatient Hospital Stay: Payer: Medicare Other | Attending: Oncology

## 2021-12-19 ENCOUNTER — Inpatient Hospital Stay: Payer: Medicare Other

## 2021-12-19 ENCOUNTER — Encounter: Payer: Self-pay | Admitting: Hematology and Oncology

## 2021-12-19 ENCOUNTER — Inpatient Hospital Stay (HOSPITAL_BASED_OUTPATIENT_CLINIC_OR_DEPARTMENT_OTHER): Payer: Medicare Other | Admitting: Hematology and Oncology

## 2021-12-19 VITALS — BP 152/62 | HR 54 | Temp 98.2°F | Resp 20 | Ht 64.0 in | Wt 275.0 lb

## 2021-12-19 DIAGNOSIS — C7800 Secondary malignant neoplasm of unspecified lung: Secondary | ICD-10-CM

## 2021-12-19 DIAGNOSIS — Z170 Estrogen receptor positive status [ER+]: Secondary | ICD-10-CM | POA: Diagnosis not present

## 2021-12-19 DIAGNOSIS — Z86718 Personal history of other venous thrombosis and embolism: Secondary | ICD-10-CM | POA: Diagnosis not present

## 2021-12-19 DIAGNOSIS — Z87891 Personal history of nicotine dependence: Secondary | ICD-10-CM | POA: Diagnosis not present

## 2021-12-19 DIAGNOSIS — C50212 Malignant neoplasm of upper-inner quadrant of left female breast: Secondary | ICD-10-CM

## 2021-12-19 DIAGNOSIS — Z5112 Encounter for antineoplastic immunotherapy: Secondary | ICD-10-CM | POA: Insufficient documentation

## 2021-12-19 DIAGNOSIS — I10 Essential (primary) hypertension: Secondary | ICD-10-CM | POA: Insufficient documentation

## 2021-12-19 DIAGNOSIS — C50919 Malignant neoplasm of unspecified site of unspecified female breast: Secondary | ICD-10-CM

## 2021-12-19 DIAGNOSIS — Z8041 Family history of malignant neoplasm of ovary: Secondary | ICD-10-CM | POA: Insufficient documentation

## 2021-12-19 DIAGNOSIS — Z803 Family history of malignant neoplasm of breast: Secondary | ICD-10-CM | POA: Insufficient documentation

## 2021-12-19 DIAGNOSIS — Z7901 Long term (current) use of anticoagulants: Secondary | ICD-10-CM | POA: Insufficient documentation

## 2021-12-19 DIAGNOSIS — Z95828 Presence of other vascular implants and grafts: Secondary | ICD-10-CM

## 2021-12-19 DIAGNOSIS — Z171 Estrogen receptor negative status [ER-]: Secondary | ICD-10-CM | POA: Insufficient documentation

## 2021-12-19 DIAGNOSIS — Z5181 Encounter for therapeutic drug level monitoring: Secondary | ICD-10-CM

## 2021-12-19 LAB — COMPREHENSIVE METABOLIC PANEL
ALT: 49 U/L — ABNORMAL HIGH (ref 0–44)
AST: 38 U/L (ref 15–41)
Albumin: 3.6 g/dL (ref 3.5–5.0)
Alkaline Phosphatase: 86 U/L (ref 38–126)
Anion gap: 4 — ABNORMAL LOW (ref 5–15)
BUN: 14 mg/dL (ref 8–23)
CO2: 32 mmol/L (ref 22–32)
Calcium: 9.1 mg/dL (ref 8.9–10.3)
Chloride: 105 mmol/L (ref 98–111)
Creatinine, Ser: 0.72 mg/dL (ref 0.44–1.00)
GFR, Estimated: >60 mL/min (ref >60)
Glucose, Bld: 112 mg/dL — ABNORMAL HIGH (ref 70–99)
Potassium: 4.2 mmol/L (ref 3.5–5.1)
Sodium: 141 mmol/L (ref 135–145)
Total Bilirubin: 0.3 mg/dL (ref 0.3–1.2)
Total Protein: 7.1 g/dL (ref 6.5–8.1)

## 2021-12-19 LAB — CBC WITH DIFFERENTIAL (CANCER CENTER ONLY)
Abs Immature Granulocytes: 0.01 10*3/uL (ref 0.00–0.07)
Basophils Absolute: 0 10*3/uL (ref 0.0–0.1)
Basophils Relative: 0 %
Eosinophils Absolute: 0.1 10*3/uL (ref 0.0–0.5)
Eosinophils Relative: 1 %
HCT: 41.4 % (ref 36.0–46.0)
Hemoglobin: 13.9 g/dL (ref 12.0–15.0)
Immature Granulocytes: 0 %
Lymphocytes Relative: 32 %
Lymphs Abs: 2.3 10*3/uL (ref 0.7–4.0)
MCH: 30 pg (ref 26.0–34.0)
MCHC: 33.6 g/dL (ref 30.0–36.0)
MCV: 89.2 fL (ref 80.0–100.0)
Monocytes Absolute: 0.5 10*3/uL (ref 0.1–1.0)
Monocytes Relative: 7 %
Neutro Abs: 4.1 10*3/uL (ref 1.7–7.7)
Neutrophils Relative %: 60 %
Platelet Count: 232 10*3/uL (ref 150–400)
RBC: 4.64 MIL/uL (ref 3.87–5.11)
RDW: 14.3 % (ref 11.5–15.5)
WBC Count: 7 10*3/uL (ref 4.0–10.5)
nRBC: 0 % (ref 0.0–0.2)

## 2021-12-19 MED ORDER — DIPHENHYDRAMINE HCL 25 MG PO CAPS
25.0000 mg | ORAL_CAPSULE | Freq: Once | ORAL | Status: AC
  Administered 2021-12-19: 25 mg via ORAL
  Filled 2021-12-19: qty 1

## 2021-12-19 MED ORDER — TRASTUZUMAB-ANNS CHEMO 150 MG IV SOLR
750.0000 mg | Freq: Once | INTRAVENOUS | Status: AC
  Administered 2021-12-19: 750 mg via INTRAVENOUS
  Filled 2021-12-19: qty 35.72

## 2021-12-19 MED ORDER — LORAZEPAM 2 MG/ML IJ SOLN
0.5000 mg | Freq: Once | INTRAMUSCULAR | Status: AC
  Administered 2021-12-19: 0.5 mg via INTRAVENOUS
  Filled 2021-12-19: qty 1

## 2021-12-19 MED ORDER — HEPARIN SOD (PORK) LOCK FLUSH 100 UNIT/ML IV SOLN
500.0000 [IU] | Freq: Once | INTRAVENOUS | Status: AC | PRN
  Administered 2021-12-19: 500 [IU]

## 2021-12-19 MED ORDER — SODIUM CHLORIDE 0.9% FLUSH
10.0000 mL | Freq: Once | INTRAVENOUS | Status: AC
  Administered 2021-12-19: 10 mL

## 2021-12-19 MED ORDER — ACETAMINOPHEN 325 MG PO TABS
650.0000 mg | ORAL_TABLET | Freq: Once | ORAL | Status: AC
  Administered 2021-12-19: 650 mg via ORAL
  Filled 2021-12-19: qty 2

## 2021-12-19 MED ORDER — SODIUM CHLORIDE 0.9 % IV SOLN: Freq: Once | INTRAVENOUS | Status: AC

## 2021-12-19 MED ORDER — SODIUM CHLORIDE 0.9% FLUSH
10.0000 mL | Freq: Once | INTRAVENOUS | Status: AC
  Administered 2021-12-19: 10 mL via INTRAVENOUS
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
## 2021-12-19 NOTE — Patient Instructions (Signed)
[facility]  Discharge Instructions: ?Thank you for choosing [facility] to provide your oncology and hematology care.  ? ?If you have a lab appointment with the [facility], please go directly to the [facility] and check in at the registration area. ?  ?Wear comfortable clothing and clothing appropriate for easy access to any Portacath or PICC line.  ? ?We strive to give you quality time with your provider. You may need to reschedule your appointment if you arrive late (15 or more minutes).  Arriving late affects you and other patients whose appointments are after yours.  Also, if you miss three or more appointments without notifying the office, you may be dismissed from the clinic at the provider?s discretion.    ?  ?For prescription refill requests, have your pharmacy contact our office and allow 72 hours for refills to be completed.   ? ?Today you received the following chemotherapy and/or immunotherapy agent: Kanjinti    ?  ?To help prevent nausea and vomiting after your treatment, we encourage you to take your nausea medication as directed. ? ?BELOW ARE SYMPTOMS THAT SHOULD BE REPORTED IMMEDIATELY: ?*FEVER GREATER THAN 100.4 F (38 ?C) OR HIGHER ?*CHILLS OR SWEATING ?*NAUSEA AND VOMITING THAT IS NOT CONTROLLED WITH YOUR NAUSEA MEDICATION ?*UNUSUAL SHORTNESS OF BREATH ?*UNUSUAL BRUISING OR BLEEDING ?*URINARY PROBLEMS (pain or burning when urinating, or frequent urination) ?*BOWEL PROBLEMS (unusual diarrhea, constipation, pain near the anus) ?TENDERNESS IN MOUTH AND THROAT WITH OR WITHOUT PRESENCE OF ULCERS (sore throat, sores in mouth, or a toothache) ?UNUSUAL RASH, SWELLING OR PAIN  ?UNUSUAL VAGINAL DISCHARGE OR ITCHING  ? ?Items with * indicate a potential emergency and should be followed up as soon as possible or go to the Emergency Department if any problems should occur. ? ?Please show the CHEMOTHERAPY ALERT CARD or IMMUNOTHERAPY ALERT CARD at check-in to  the Emergency Department and triage nurse. ? ?Should you have questions after your visit or need to cancel or reschedule your appointment, please contact [facility]  Dept: [phone]  and follow the prompts.  Office hours are 8:00 a.m. to 4:30 p.m. Monday - Friday. Please note that voicemails left after 4:00 p.m. may not be returned until the following business day.  We are closed weekends and major holidays. You have access to a nurse at all times for urgent questions. Please call the main number to the clinic Dept: [phone] and follow the prompts. ? ? ?For any non-urgent questions, you may also contact your provider using MyChart. We now offer e-Visits for anyone 49 and older to request care online for non-urgent symptoms. For details visit [email or web address]. ?  ?Also download the MyChart app! Go to the app store, search "MyChart", open the app, select [facility], and log in with your MyChart username and password. ? ?Due to Covid, a mask is required upon entering the hospital/clinic. If you do not have a mask, one will be given to you upon arrival. For doctor visits, patients may have 1 support person aged 48 or older with them. For treatment visits, patients cannot have anyone with them due to current Covid guidelines and our immunocompromised population.  ? ?

## 2021-12-24 DIAGNOSIS — E1149 Type 2 diabetes mellitus with other diabetic neurological complication: Secondary | ICD-10-CM | POA: Diagnosis not present

## 2021-12-26 ENCOUNTER — Other Ambulatory Visit: Payer: Self-pay | Admitting: *Deleted

## 2021-12-26 MED ORDER — FLUCONAZOLE 100 MG PO TABS: 100.0000 mg | ORAL_TABLET | Freq: Every day | ORAL | 1 refills | Status: DC

## 2022-01-07 DIAGNOSIS — I5020 Unspecified systolic (congestive) heart failure: Secondary | ICD-10-CM | POA: Diagnosis not present

## 2022-01-07 DIAGNOSIS — M25569 Pain in unspecified knee: Secondary | ICD-10-CM | POA: Diagnosis not present

## 2022-01-07 DIAGNOSIS — U071 COVID-19: Secondary | ICD-10-CM | POA: Diagnosis not present

## 2022-01-07 DIAGNOSIS — R062 Wheezing: Secondary | ICD-10-CM | POA: Diagnosis not present

## 2022-01-14 ENCOUNTER — Other Ambulatory Visit: Payer: Self-pay | Admitting: Family Medicine

## 2022-01-14 ENCOUNTER — Other Ambulatory Visit: Payer: Self-pay | Admitting: *Deleted

## 2022-01-14 DIAGNOSIS — C50919 Malignant neoplasm of unspecified site of unspecified female breast: Secondary | ICD-10-CM

## 2022-01-15 ENCOUNTER — Encounter: Payer: Self-pay | Admitting: Hematology and Oncology

## 2022-01-16 ENCOUNTER — Other Ambulatory Visit: Payer: Self-pay

## 2022-01-16 ENCOUNTER — Inpatient Hospital Stay: Payer: Medicare Other | Attending: Oncology

## 2022-01-16 ENCOUNTER — Inpatient Hospital Stay (HOSPITAL_BASED_OUTPATIENT_CLINIC_OR_DEPARTMENT_OTHER): Payer: Medicare Other | Admitting: Hematology and Oncology

## 2022-01-16 ENCOUNTER — Other Ambulatory Visit: Payer: Self-pay | Admitting: Hematology and Oncology

## 2022-01-16 ENCOUNTER — Inpatient Hospital Stay: Payer: Medicare Other

## 2022-01-16 ENCOUNTER — Encounter: Payer: Self-pay | Admitting: Hematology and Oncology

## 2022-01-16 ENCOUNTER — Ambulatory Visit (HOSPITAL_BASED_OUTPATIENT_CLINIC_OR_DEPARTMENT_OTHER)
Admission: RE | Admit: 2022-01-16 | Discharge: 2022-01-16 | Disposition: A | Discharge status: Home / Self Care | Payer: Medicare Other | Source: Ambulatory Visit | Attending: Hematology and Oncology | Admitting: Hematology and Oncology

## 2022-01-16 VITALS — BP 174/77 | HR 60 | Temp 98.0°F | Resp 20 | Wt 309.0 lb

## 2022-01-16 DIAGNOSIS — U071 COVID-19: Secondary | ICD-10-CM | POA: Diagnosis not present

## 2022-01-16 DIAGNOSIS — C50212 Malignant neoplasm of upper-inner quadrant of left female breast: Secondary | ICD-10-CM

## 2022-01-16 DIAGNOSIS — G4730 Sleep apnea, unspecified: Secondary | ICD-10-CM | POA: Insufficient documentation

## 2022-01-16 DIAGNOSIS — Z5181 Encounter for therapeutic drug level monitoring: Secondary | ICD-10-CM | POA: Insufficient documentation

## 2022-01-16 DIAGNOSIS — Z0189 Encounter for other specified special examinations: Secondary | ICD-10-CM

## 2022-01-16 DIAGNOSIS — C50919 Malignant neoplasm of unspecified site of unspecified female breast: Secondary | ICD-10-CM | POA: Insufficient documentation

## 2022-01-16 DIAGNOSIS — C7800 Secondary malignant neoplasm of unspecified lung: Secondary | ICD-10-CM

## 2022-01-16 DIAGNOSIS — E119 Type 2 diabetes mellitus without complications: Secondary | ICD-10-CM | POA: Insufficient documentation

## 2022-01-16 DIAGNOSIS — Z79899 Other long term (current) drug therapy: Secondary | ICD-10-CM | POA: Insufficient documentation

## 2022-01-16 DIAGNOSIS — Z171 Estrogen receptor negative status [ER-]: Secondary | ICD-10-CM | POA: Diagnosis not present

## 2022-01-16 DIAGNOSIS — I5020 Unspecified systolic (congestive) heart failure: Secondary | ICD-10-CM | POA: Diagnosis not present

## 2022-01-16 DIAGNOSIS — M25569 Pain in unspecified knee: Secondary | ICD-10-CM | POA: Diagnosis not present

## 2022-01-16 DIAGNOSIS — Z7901 Long term (current) use of anticoagulants: Secondary | ICD-10-CM | POA: Diagnosis not present

## 2022-01-16 DIAGNOSIS — I10 Essential (primary) hypertension: Secondary | ICD-10-CM | POA: Insufficient documentation

## 2022-01-16 DIAGNOSIS — Z79811 Long term (current) use of aromatase inhibitors: Secondary | ICD-10-CM | POA: Insufficient documentation

## 2022-01-16 DIAGNOSIS — Z5112 Encounter for antineoplastic immunotherapy: Secondary | ICD-10-CM | POA: Diagnosis not present

## 2022-01-16 DIAGNOSIS — C7801 Secondary malignant neoplasm of right lung: Secondary | ICD-10-CM | POA: Insufficient documentation

## 2022-01-16 DIAGNOSIS — Z86718 Personal history of other venous thrombosis and embolism: Secondary | ICD-10-CM | POA: Diagnosis not present

## 2022-01-16 DIAGNOSIS — Z95828 Presence of other vascular implants and grafts: Secondary | ICD-10-CM

## 2022-01-16 DIAGNOSIS — R062 Wheezing: Secondary | ICD-10-CM | POA: Diagnosis not present

## 2022-01-16 LAB — CBC WITH DIFFERENTIAL (CANCER CENTER ONLY)
Abs Immature Granulocytes: 0.02 10*3/uL (ref 0.00–0.07)
Basophils Absolute: 0 10*3/uL (ref 0.0–0.1)
Basophils Relative: 0 %
Eosinophils Absolute: 0.1 10*3/uL (ref 0.0–0.5)
Eosinophils Relative: 2 %
HCT: 41.9 % (ref 36.0–46.0)
Hemoglobin: 14.1 g/dL (ref 12.0–15.0)
Immature Granulocytes: 0 %
Lymphocytes Relative: 33 %
Lymphs Abs: 2.5 10*3/uL (ref 0.7–4.0)
MCH: 29.9 pg (ref 26.0–34.0)
MCHC: 33.7 g/dL (ref 30.0–36.0)
MCV: 88.8 fL (ref 80.0–100.0)
Monocytes Absolute: 0.6 10*3/uL (ref 0.1–1.0)
Monocytes Relative: 8 %
Neutro Abs: 4.2 10*3/uL (ref 1.7–7.7)
Neutrophils Relative %: 57 %
Platelet Count: 227 10*3/uL (ref 150–400)
RBC: 4.72 MIL/uL (ref 3.87–5.11)
RDW: 13.9 % (ref 11.5–15.5)
WBC Count: 7.4 10*3/uL (ref 4.0–10.5)
nRBC: 0 % (ref 0.0–0.2)

## 2022-01-16 LAB — ECHOCARDIOGRAM COMPLETE
AR max vel: 2.12 cm2
AV Peak grad: 5.9 mmHg
Ao pk vel: 1.21 m/s
Area-P 1/2: 3.06 cm2
Calc EF: 65.5 %
S' Lateral: 2.4 cm
Single Plane A2C EF: 64.5 %
Single Plane A4C EF: 66 %
Weight: 4944 oz

## 2022-01-16 LAB — CMP (CANCER CENTER ONLY)
ALT: 54 U/L — ABNORMAL HIGH (ref 0–44)
AST: 46 U/L — ABNORMAL HIGH (ref 15–41)
Albumin: 3.8 g/dL (ref 3.5–5.0)
Alkaline Phosphatase: 75 U/L (ref 38–126)
Anion gap: 7 (ref 5–15)
BUN: 16 mg/dL (ref 8–23)
CO2: 30 mmol/L (ref 22–32)
Calcium: 9.6 mg/dL (ref 8.9–10.3)
Chloride: 103 mmol/L (ref 98–111)
Creatinine: 0.67 mg/dL (ref 0.44–1.00)
GFR, Estimated: >60 mL/min (ref >60)
Glucose, Bld: 102 mg/dL — ABNORMAL HIGH (ref 70–99)
Potassium: 4.2 mmol/L (ref 3.5–5.1)
Sodium: 140 mmol/L (ref 135–145)
Total Bilirubin: 0.7 mg/dL (ref 0.3–1.2)
Total Protein: 7.3 g/dL (ref 6.5–8.1)

## 2022-01-16 MED ORDER — TRASTUZUMAB-ANNS CHEMO 150 MG IV SOLR
750.0000 mg | Freq: Once | INTRAVENOUS | Status: AC
  Administered 2022-01-16: 750 mg via INTRAVENOUS
  Filled 2022-01-16: qty 35.72

## 2022-01-16 MED ORDER — ACETAMINOPHEN 325 MG PO TABS
650.0000 mg | ORAL_TABLET | Freq: Once | ORAL | Status: AC
  Administered 2022-01-16: 650 mg via ORAL
  Filled 2022-01-16: qty 2

## 2022-01-16 MED ORDER — SODIUM CHLORIDE 0.9% FLUSH
10.0000 mL | Freq: Once | INTRAVENOUS | Status: AC
  Administered 2022-01-16: 10 mL via INTRAVENOUS

## 2022-01-16 MED ORDER — SODIUM CHLORIDE 0.9% FLUSH
10.0000 mL | Freq: Once | INTRAVENOUS | Status: AC
  Administered 2022-01-16: 10 mL

## 2022-01-16 MED ORDER — SODIUM CHLORIDE 0.9 % IV SOLN: Freq: Once | INTRAVENOUS | Status: AC

## 2022-01-16 MED ORDER — DIPHENHYDRAMINE HCL 25 MG PO CAPS
25.0000 mg | ORAL_CAPSULE | Freq: Once | ORAL | Status: AC
  Administered 2022-01-16: 25 mg via ORAL
  Filled 2022-01-16: qty 1

## 2022-01-16 MED ORDER — LORAZEPAM 2 MG/ML IJ SOLN
0.5000 mg | Freq: Once | INTRAMUSCULAR | Status: AC
  Administered 2022-01-16: 0.5 mg via INTRAVENOUS
  Filled 2022-01-16: qty 1

## 2022-01-16 MED ORDER — HEPARIN SOD (PORK) LOCK FLUSH 100 UNIT/ML IV SOLN
500.0000 [IU] | Freq: Once | INTRAVENOUS | Status: AC | PRN
  Administered 2022-01-16: 500 [IU]
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
## 2022-01-16 NOTE — Patient Instructions (Signed)
[facility]  Discharge Instructions: Thank you for choosing [facility] to provide your oncology and hematology care.   If you have a lab appointment with the [facility], please go directly to the [facility] and check in at the registration area.   Wear comfortable clothing and clothing appropriate for easy access to any Portacath or PICC line.   We strive to give you quality time with your provider. You may need to reschedule your appointment if you arrive late (15 or more minutes).  Arriving late affects you and other patients whose appointments are after yours.  Also, if you miss three or more appointments without notifying the office, you may be dismissed from the clinic at the provider's discretion.      For prescription refill requests, have your pharmacy contact our office and allow 72 hours for refills to be completed.    Today you received the following chemotherapy and/or immunotherapy agent: Kanjinti      To help prevent nausea and vomiting after your treatment, we encourage you to take your nausea medication as directed.  BELOW ARE SYMPTOMS THAT SHOULD BE REPORTED IMMEDIATELY: *FEVER GREATER THAN 100.4 F (38 C) OR HIGHER *CHILLS OR SWEATING *NAUSEA AND VOMITING THAT IS NOT CONTROLLED WITH YOUR NAUSEA MEDICATION *UNUSUAL SHORTNESS OF BREATH *UNUSUAL BRUISING OR BLEEDING *URINARY PROBLEMS (pain or burning when urinating, or frequent urination) *BOWEL PROBLEMS (unusual diarrhea, constipation, pain near the anus) TENDERNESS IN MOUTH AND THROAT WITH OR WITHOUT PRESENCE OF ULCERS (sore throat, sores in mouth, or a toothache) UNUSUAL RASH, SWELLING OR PAIN  UNUSUAL VAGINAL DISCHARGE OR ITCHING   Items with * indicate a potential emergency and should be followed up as soon as possible or go to the Emergency Department if any problems should occur.  Please show the CHEMOTHERAPY ALERT CARD or IMMUNOTHERAPY ALERT CARD at check-in to  the Emergency Department and triage nurse.  Should you have questions after your visit or need to cancel or reschedule your appointment, please contact [facility]  Dept: [phone]  and follow the prompts.  Office hours are 8:00 a.m. to 4:30 p.m. Monday - Friday. Please note that voicemails left after 4:00 p.m. may not be returned until the following business day.  We are closed weekends and major holidays. You have access to a nurse at all times for urgent questions. Please call the main number to the clinic Dept: [phone] and follow the prompts.   For any non-urgent questions, you may also contact your provider using MyChart. We now offer e-Visits for anyone 67 and older to request care online for non-urgent symptoms. For details visit [email or web address].   Also download the MyChart app! Go to the app store, search "MyChart", open the app, select [facility], and log in with your MyChart username and password.  Due to Covid, a mask is required upon entering the hospital/clinic. If you do not have a mask, one will be given to you upon arrival. For doctor visits, patients may have 1 support person aged 56 or older with them. For treatment visits, patients cannot have anyone with them due to current Covid guidelines and our immunocompromised population.

## 2022-01-21 ENCOUNTER — Encounter: Payer: Self-pay | Admitting: *Deleted

## 2022-01-24 DIAGNOSIS — E1149 Type 2 diabetes mellitus with other diabetic neurological complication: Secondary | ICD-10-CM | POA: Diagnosis not present

## 2022-02-05 ENCOUNTER — Ambulatory Visit (HOSPITAL_COMMUNITY): Payer: Medicare Other

## 2022-02-07 ENCOUNTER — Other Ambulatory Visit: Payer: Self-pay | Admitting: Family Medicine

## 2022-02-13 ENCOUNTER — Inpatient Hospital Stay (HOSPITAL_BASED_OUTPATIENT_CLINIC_OR_DEPARTMENT_OTHER): Payer: Medicare Other | Admitting: Hematology and Oncology

## 2022-02-13 ENCOUNTER — Other Ambulatory Visit: Payer: Self-pay

## 2022-02-13 ENCOUNTER — Inpatient Hospital Stay: Payer: Medicare Other

## 2022-02-13 ENCOUNTER — Encounter: Payer: Self-pay | Admitting: Hematology and Oncology

## 2022-02-13 VITALS — BP 134/70 | HR 64 | Temp 97.0°F | Resp 18 | Wt 310.3 lb

## 2022-02-13 DIAGNOSIS — Z5181 Encounter for therapeutic drug level monitoring: Secondary | ICD-10-CM | POA: Diagnosis not present

## 2022-02-13 DIAGNOSIS — C50919 Malignant neoplasm of unspecified site of unspecified female breast: Secondary | ICD-10-CM

## 2022-02-13 DIAGNOSIS — E119 Type 2 diabetes mellitus without complications: Secondary | ICD-10-CM | POA: Diagnosis not present

## 2022-02-13 DIAGNOSIS — C50912 Malignant neoplasm of unspecified site of left female breast: Secondary | ICD-10-CM

## 2022-02-13 DIAGNOSIS — C50212 Malignant neoplasm of upper-inner quadrant of left female breast: Secondary | ICD-10-CM

## 2022-02-13 DIAGNOSIS — I10 Essential (primary) hypertension: Secondary | ICD-10-CM | POA: Diagnosis not present

## 2022-02-13 DIAGNOSIS — Z171 Estrogen receptor negative status [ER-]: Secondary | ICD-10-CM | POA: Diagnosis not present

## 2022-02-13 DIAGNOSIS — M549 Dorsalgia, unspecified: Secondary | ICD-10-CM | POA: Diagnosis not present

## 2022-02-13 DIAGNOSIS — Z79899 Other long term (current) drug therapy: Secondary | ICD-10-CM

## 2022-02-13 DIAGNOSIS — Z5112 Encounter for antineoplastic immunotherapy: Secondary | ICD-10-CM | POA: Diagnosis not present

## 2022-02-13 DIAGNOSIS — G8929 Other chronic pain: Secondary | ICD-10-CM | POA: Diagnosis not present

## 2022-02-13 DIAGNOSIS — C7802 Secondary malignant neoplasm of left lung: Secondary | ICD-10-CM

## 2022-02-13 DIAGNOSIS — G4730 Sleep apnea, unspecified: Secondary | ICD-10-CM | POA: Diagnosis not present

## 2022-02-13 DIAGNOSIS — Z86718 Personal history of other venous thrombosis and embolism: Secondary | ICD-10-CM | POA: Diagnosis not present

## 2022-02-13 DIAGNOSIS — C7801 Secondary malignant neoplasm of right lung: Secondary | ICD-10-CM | POA: Diagnosis not present

## 2022-02-13 DIAGNOSIS — Z7901 Long term (current) use of anticoagulants: Secondary | ICD-10-CM | POA: Diagnosis not present

## 2022-02-13 DIAGNOSIS — Z95828 Presence of other vascular implants and grafts: Secondary | ICD-10-CM

## 2022-02-13 DIAGNOSIS — Z79811 Long term (current) use of aromatase inhibitors: Secondary | ICD-10-CM | POA: Diagnosis not present

## 2022-02-13 LAB — CBC WITH DIFFERENTIAL (CANCER CENTER ONLY)
Abs Immature Granulocytes: 0.02 10*3/uL (ref 0.00–0.07)
Basophils Absolute: 0 10*3/uL (ref 0.0–0.1)
Basophils Relative: 1 %
Eosinophils Absolute: 0.1 10*3/uL (ref 0.0–0.5)
Eosinophils Relative: 2 %
HCT: 41.1 % (ref 36.0–46.0)
Hemoglobin: 13.8 g/dL (ref 12.0–15.0)
Immature Granulocytes: 0 %
Lymphocytes Relative: 32 %
Lymphs Abs: 2 10*3/uL (ref 0.7–4.0)
MCH: 29.9 pg (ref 26.0–34.0)
MCHC: 33.6 g/dL (ref 30.0–36.0)
MCV: 89 fL (ref 80.0–100.0)
Monocytes Absolute: 0.5 10*3/uL (ref 0.1–1.0)
Monocytes Relative: 8 %
Neutro Abs: 3.7 10*3/uL (ref 1.7–7.7)
Neutrophils Relative %: 57 %
Platelet Count: 222 10*3/uL (ref 150–400)
RBC: 4.62 MIL/uL (ref 3.87–5.11)
RDW: 14.1 % (ref 11.5–15.5)
WBC Count: 6.3 10*3/uL (ref 4.0–10.5)
nRBC: 0 % (ref 0.0–0.2)

## 2022-02-13 LAB — CMP (CANCER CENTER ONLY)
ALT: 59 U/L — ABNORMAL HIGH (ref 0–44)
AST: 45 U/L — ABNORMAL HIGH (ref 15–41)
Albumin: 3.8 g/dL (ref 3.5–5.0)
Alkaline Phosphatase: 78 U/L (ref 38–126)
Anion gap: 4 — ABNORMAL LOW (ref 5–15)
BUN: 14 mg/dL (ref 8–23)
CO2: 34 mmol/L — ABNORMAL HIGH (ref 22–32)
Calcium: 9.7 mg/dL (ref 8.9–10.3)
Chloride: 103 mmol/L (ref 98–111)
Creatinine: 0.66 mg/dL (ref 0.44–1.00)
GFR, Estimated: >60 mL/min (ref >60)
Glucose, Bld: 130 mg/dL — ABNORMAL HIGH (ref 70–99)
Potassium: 4.2 mmol/L (ref 3.5–5.1)
Sodium: 141 mmol/L (ref 135–145)
Total Bilirubin: 0.5 mg/dL (ref 0.3–1.2)
Total Protein: 7.3 g/dL (ref 6.5–8.1)

## 2022-02-13 MED ORDER — ACETAMINOPHEN 325 MG PO TABS
650.0000 mg | ORAL_TABLET | Freq: Once | ORAL | Status: AC
  Administered 2022-02-13: 650 mg via ORAL
  Filled 2022-02-13: qty 2

## 2022-02-13 MED ORDER — SODIUM CHLORIDE 0.9% FLUSH
10.0000 mL | Freq: Once | INTRAVENOUS | Status: AC
  Administered 2022-02-13: 10 mL

## 2022-02-13 MED ORDER — TRASTUZUMAB-ANNS CHEMO 150 MG IV SOLR
750.0000 mg | Freq: Once | INTRAVENOUS | Status: AC
  Administered 2022-02-13: 750 mg via INTRAVENOUS
  Filled 2022-02-13: qty 35.72

## 2022-02-13 MED ORDER — DIPHENHYDRAMINE HCL 25 MG PO CAPS
25.0000 mg | ORAL_CAPSULE | Freq: Once | ORAL | Status: AC
  Administered 2022-02-13: 25 mg via ORAL
  Filled 2022-02-13: qty 1

## 2022-02-13 MED ORDER — LORAZEPAM 2 MG/ML IJ SOLN
0.5000 mg | Freq: Once | INTRAMUSCULAR | Status: AC
  Administered 2022-02-13: 0.5 mg via INTRAVENOUS
  Filled 2022-02-13: qty 1

## 2022-02-13 MED ORDER — SODIUM CHLORIDE 0.9% FLUSH
10.0000 mL | Freq: Once | INTRAVENOUS | Status: AC
  Administered 2022-02-13: 10 mL via INTRAVENOUS

## 2022-02-13 MED ORDER — HEPARIN SOD (PORK) LOCK FLUSH 100 UNIT/ML IV SOLN
500.0000 [IU] | Freq: Once | INTRAVENOUS | Status: AC | PRN
  Administered 2022-02-13: 500 [IU]

## 2022-02-13 MED ORDER — SODIUM CHLORIDE 0.9 % IV SOLN: Freq: Once | INTRAVENOUS | Status: AC

## 2022-02-13 NOTE — Patient Instructions (Signed)
[facility]  Discharge Instructions: Thank you for choosing [facility] to provide your oncology and hematology care.   If you have a lab appointment with the [facility], please go directly to the [facility] and check in at the registration area.   Wear comfortable clothing and clothing appropriate for easy access to any Portacath or PICC line.   We strive to give you quality time with your provider. You may need to reschedule your appointment if you arrive late (15 or more minutes).  Arriving late affects you and other patients whose appointments are after yours.  Also, if you miss three or more appointments without notifying the office, you may be dismissed from the clinic at the provider's discretion.      For prescription refill requests, have your pharmacy contact our office and allow 72 hours for refills to be completed.    Today you received the following chemotherapy and/or immunotherapy agent: Kanjinti      To help prevent nausea and vomiting after your treatment, we encourage you to take your nausea medication as directed.  BELOW ARE SYMPTOMS THAT SHOULD BE REPORTED IMMEDIATELY: *FEVER GREATER THAN 100.4 F (38 C) OR HIGHER *CHILLS OR SWEATING *NAUSEA AND VOMITING THAT IS NOT CONTROLLED WITH YOUR NAUSEA MEDICATION *UNUSUAL SHORTNESS OF BREATH *UNUSUAL BRUISING OR BLEEDING *URINARY PROBLEMS (pain or burning when urinating, or frequent urination) *BOWEL PROBLEMS (unusual diarrhea, constipation, pain near the anus) TENDERNESS IN MOUTH AND THROAT WITH OR WITHOUT PRESENCE OF ULCERS (sore throat, sores in mouth, or a toothache) UNUSUAL RASH, SWELLING OR PAIN  UNUSUAL VAGINAL DISCHARGE OR ITCHING   Items with * indicate a potential emergency and should be followed up as soon as possible or go to the Emergency Department if any problems should occur.  Please show the CHEMOTHERAPY ALERT CARD or IMMUNOTHERAPY ALERT CARD at check-in to  the Emergency Department and triage nurse.  Should you have questions after your visit or need to cancel or reschedule your appointment, please contact [facility]  Dept: [phone]  and follow the prompts.  Office hours are 8:00 a.m. to 4:30 p.m. Monday - Friday. Please note that voicemails left after 4:00 p.m. may not be returned until the following business day.  We are closed weekends and major holidays. You have access to a nurse at all times for urgent questions. Please call the main number to the clinic Dept: [phone] and follow the prompts.   For any non-urgent questions, you may also contact your provider using MyChart. We now offer e-Visits for anyone 73 and older to request care online for non-urgent symptoms. For details visit [email or web address].   Also download the MyChart app! Go to the app store, search "MyChart", open the app, select [facility], and log in with your MyChart username and password.  Masks are optional in the cancer centers. If you would like for your care team to wear a mask while they are taking care of you, please let them know. For doctor visits, patients may have with them one support person who is at least 72 years old. At this time, visitors are not allowed in the infusion area.
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
## 2022-02-15 ENCOUNTER — Other Ambulatory Visit: Payer: Self-pay | Admitting: Family Medicine

## 2022-02-15 ENCOUNTER — Other Ambulatory Visit: Payer: Self-pay | Admitting: Hematology and Oncology

## 2022-02-15 DIAGNOSIS — R062 Wheezing: Secondary | ICD-10-CM | POA: Diagnosis not present

## 2022-02-15 DIAGNOSIS — U071 COVID-19: Secondary | ICD-10-CM | POA: Diagnosis not present

## 2022-02-15 DIAGNOSIS — M25569 Pain in unspecified knee: Secondary | ICD-10-CM | POA: Diagnosis not present

## 2022-02-15 DIAGNOSIS — I5020 Unspecified systolic (congestive) heart failure: Secondary | ICD-10-CM | POA: Diagnosis not present

## 2022-02-17 ENCOUNTER — Other Ambulatory Visit: Payer: Self-pay | Admitting: Family Medicine

## 2022-02-17 ENCOUNTER — Encounter: Payer: Self-pay | Admitting: Hematology and Oncology

## 2022-02-17 ENCOUNTER — Inpatient Hospital Stay: Payer: Medicare Other | Attending: Oncology | Admitting: Nurse Practitioner

## 2022-02-17 ENCOUNTER — Other Ambulatory Visit (HOSPITAL_COMMUNITY): Payer: Self-pay

## 2022-02-17 ENCOUNTER — Encounter: Payer: Self-pay | Admitting: Nurse Practitioner

## 2022-02-17 ENCOUNTER — Other Ambulatory Visit: Payer: Self-pay | Admitting: *Deleted

## 2022-02-17 DIAGNOSIS — Z170 Estrogen receptor positive status [ER+]: Secondary | ICD-10-CM | POA: Insufficient documentation

## 2022-02-17 DIAGNOSIS — Z5112 Encounter for antineoplastic immunotherapy: Secondary | ICD-10-CM | POA: Insufficient documentation

## 2022-02-17 DIAGNOSIS — G893 Neoplasm related pain (acute) (chronic): Secondary | ICD-10-CM | POA: Diagnosis not present

## 2022-02-17 DIAGNOSIS — C50212 Malignant neoplasm of upper-inner quadrant of left female breast: Secondary | ICD-10-CM | POA: Insufficient documentation

## 2022-02-17 DIAGNOSIS — C50919 Malignant neoplasm of unspecified site of unspecified female breast: Secondary | ICD-10-CM

## 2022-02-17 DIAGNOSIS — K5900 Constipation, unspecified: Secondary | ICD-10-CM | POA: Diagnosis not present

## 2022-02-17 DIAGNOSIS — C7801 Secondary malignant neoplasm of right lung: Secondary | ICD-10-CM | POA: Diagnosis not present

## 2022-02-17 DIAGNOSIS — Z87891 Personal history of nicotine dependence: Secondary | ICD-10-CM | POA: Insufficient documentation

## 2022-02-17 DIAGNOSIS — E119 Type 2 diabetes mellitus without complications: Secondary | ICD-10-CM | POA: Insufficient documentation

## 2022-02-17 DIAGNOSIS — Z515 Encounter for palliative care: Secondary | ICD-10-CM

## 2022-02-17 DIAGNOSIS — F419 Anxiety disorder, unspecified: Secondary | ICD-10-CM | POA: Insufficient documentation

## 2022-02-17 DIAGNOSIS — C7802 Secondary malignant neoplasm of left lung: Secondary | ICD-10-CM | POA: Diagnosis not present

## 2022-02-17 DIAGNOSIS — G629 Polyneuropathy, unspecified: Secondary | ICD-10-CM | POA: Diagnosis not present

## 2022-02-17 DIAGNOSIS — I10 Essential (primary) hypertension: Secondary | ICD-10-CM | POA: Insufficient documentation

## 2022-02-17 DIAGNOSIS — M792 Neuralgia and neuritis, unspecified: Secondary | ICD-10-CM

## 2022-02-17 MED ORDER — LETROZOLE 2.5 MG PO TABS: 2.5000 mg | ORAL_TABLET | Freq: Every day | ORAL | 12 refills | Status: DC

## 2022-02-17 MED ORDER — PREGABALIN 25 MG PO CAPS: 50.0000 mg | ORAL_CAPSULE | Freq: Two times a day (BID) | ORAL | 1 refills | Status: DC

## 2022-02-17 MED ORDER — OXYCODONE HCL 5 MG PO TABS
5.0000 mg | ORAL_TABLET | Freq: Four times a day (QID) | ORAL | 0 refills | Status: DC | PRN
  Filled 2022-02-17: qty 60, 15d supply, fill #0

## 2022-02-17 MED ORDER — XTAMPZA ER 9 MG PO C12A
9.0000 mg | EXTENDED_RELEASE_CAPSULE | Freq: Two times a day (BID) | ORAL | 0 refills | Status: DC
Start: 2022-02-17 — End: 2022-03-13
  Filled 2022-02-17: qty 14, 7d supply, fill #0

## 2022-02-17 NOTE — Progress Notes (Signed)
[facility]  [phone] [phone]   Name: [name] Date: 02/17/2022 MRN: [id number]  DOB: 1951-01-12  Patient Care Team: [name], MD as PCP - General (Family Medicine) Larey Dresser, MD as Consulting Physician (Cardiology) Benay Pike, MD as Consulting Physician (Hematology and Oncology)   I connected with [name] on 02/19/22 at 10:00 AM EDT by video and verified that I am speaking with the correct person using two identifiers.   I discussed the limitations, risks, security and privacy concerns of performing an evaluation and management service by telemedicine and the availability of in-person appointments. I also discussed with the patient that there may be a patient responsible charge related to this service. The patient expressed understanding and agreed to proceed.   Other persons participating in the visit and their role in the encounter: Maygan, RN    Patient's location: Home  Provider's location: [place]    Chief Complaint: Symptom Management    REASON FOR CONSULTATION: [name] is a 71 y.o. female with medical history including metastatic breast cancer with numerous pulmonary nodules bilaterally s/p chemotherapy currently on herceptin for maintenance. Now with complaints of uncontrolled back pain.  Palliative ask to see for symptom management.    SOCIAL HISTORY:     reports that she quit smoking about 53 years ago. Her smoking use included cigarettes. She has a 15.00 pack-year smoking history. She has never used smokeless tobacco. She reports that she does not drink alcohol and does not use drugs.  ADVANCE DIRECTIVES:    CODE STATUS:   PAST MEDICAL HISTORY: Past Medical History:  Diagnosis Date   Arthritis    Back pain    Breast cancer ([place]) dx'd 11/2012   left   Chest pain    COPD (chronic obstructive pulmonary disease) ([place])    Diabetes mellitus without  complication ([place]) 2/64/1583   Ear pain    Fatty liver 6/03   Hypertension    Lung disease    Lung metastases dx'd 11/2012   Obesity, unspecified    Other abnormal glucose    Suicide attempt ([place]) 1996   Syncope and collapse    Unspecified sleep apnea     PAST SURGICAL HISTORY:  Past Surgical History:  Procedure Laterality Date   CARDIAC CATHETERIZATION     2007   CHOLECYSTECTOMY     TUBAL LIGATION      HEMATOLOGY/ONCOLOGY HISTORY:  Oncology History  Malignant neoplasm of upper-inner quadrant of left breast in female, estrogen receptor positive ([place])  12/06/2012 -  Chemotherapy   Patient is on Treatment Plan : BREAST Herceptin q28d per Dr. Jana Hakim     05/23/2013 Initial Diagnosis   Malignant neoplasm of upper-inner quadrant of left breast in female, estrogen receptor positive ([place])     ALLERGIES:  is allergic to meperidine hcl, penicillins, amoxicillin, aspirin, and percocet [oxycodone-acetaminophen].  MEDICATIONS:  Current Outpatient Medications  Medication Sig Dispense Refill   oxyCODONE (OXY IR/ROXICODONE) 5 MG immediate release tablet Take 1 tablet (5 mg total) by mouth every 6 (six) hours as needed for severe pain. 60 tablet 0   oxyCODONE ER (XTAMPZA ER) 9 MG C12A Take 9 mg by mouth every 12 (twelve) hours. 14 capsule 0   acetaminophen (TYLENOL) 325 MG tablet Take 2 tablets (650 mg total) by mouth every 6 (six) hours as needed for up to 21 doses for moderate pain or mild pain. 21 tablet 0  albuterol (VENTOLIN HFA) 108 (90 Base) MCG/ACT inhaler INHALE 2 PUFFS INTO THE LUNGS EVERY 6 HOURS AS NEEDED FOR WHEEZE 8.5 each 1   Blood Glucose Monitoring Suppl (ONE TOUCH ULTRA 2) w/Device KIT 1 kit by Does not apply route QID. ICD 10-code: E11.49. 1 kit 0   fluconazole (DIFLUCAN) 100 MG tablet Take 1 tablet (100 mg total) by mouth daily. 10 tablet 1   glucose blood (FREESTYLE PRECISION NEO TEST) test strip Use as instructed to check once daily 100 each 12   ibuprofen (ADVIL)  600 MG tablet Take 1 tablet (600 mg total) by mouth every 8 (eight) hours as needed for up to 21 doses for mild pain or moderate pain. 21 tablet 0   insulin glargine (LANTUS SOLOSTAR) 100 UNIT/ML Solostar Pen Inject 32 Units into the skin daily. 15 mL 6   insulin lispro (HUMALOG KWIKPEN) 100 UNIT/ML KwikPen INJECT 10 UNITS INTO THE SKIN 3 TIMES A DAY WITH MEALS- 3 mL 1   Insulin Pen Needle (PEN NEEDLES) 30G X 8 MM MISC Please use to inject insulin 4 times daily. 200 each 3   ketoconazole (NIZORAL) 2 % cream Apply 1 application. topically daily. 15 g 0   letrozole (FEMARA) 2.5 MG tablet Take 1 tablet (2.5 mg total) by mouth daily. (Patient not taking: Reported on 11/27/2021) 90 tablet 12   loperamide (IMODIUM) 2 MG capsule TAKE 1 CAPSULE (2 MG TOTAL) BY MOUTH AS NEEDED FOR DIARRHEA OR LOOSE STOOLS. 30 capsule 1   metFORMIN (GLUCOPHAGE-XR) 500 MG 24 hr tablet TAKE 1 TABLET (500 MG TOTAL) BY MOUTH IN THE MORNING AND AT BEDTIME. 180 tablet 1   ondansetron (ZOFRAN) 4 MG tablet Take 1 tablet (4 mg total) by mouth every 8 (eight) hours as needed for nausea or vomiting. 30 tablet 1   OXYGEN Inhale into the lungs.     pregabalin (LYRICA) 25 MG capsule Take 2 capsules (50 mg total) by mouth 2 (two) times daily. 180 capsule 1   Semaglutide, 1 MG/DOSE, (OZEMPIC, 1 MG/DOSE,) 4 MG/3ML SOPN INJECT 1 MG INTO THE SKIN ONE TIME PER WEEK 3 mL 3   No current facility-administered medications for this visit.    VITAL SIGNS: There were no vitals taken for this visit. There were no vitals filed for this visit.  Estimated body mass index is 53.27 kg/m as calculated from the following:   Height as of 12/19/21: _0  (1.626 m).   Weight as of 02/13/22: 140.8 kg.  LABS: CBC:    Component Value Date/Time   WBC 6.3 02/13/2022 1250   WBC 8.0 05/10/2020 0447   HGB 13.8 02/13/2022 1250   HGB 12.8 08/05/2017 0927   HCT 41.1 02/13/2022 1250   HCT 39.8 08/05/2017 0927   PLT 222 02/13/2022 1250   PLT 267 08/05/2017 0927    MCV 89.0 02/13/2022 1250   MCV 86.7 08/05/2017 0927   NEUTROABS 3.7 02/13/2022 1250   NEUTROABS 3.8 08/05/2017 0927   LYMPHSABS 2.0 02/13/2022 1250   LYMPHSABS 2.3 08/05/2017 0927   MONOABS 0.5 02/13/2022 1250   MONOABS 0.4 08/05/2017 0927   EOSABS 0.1 02/13/2022 1250   EOSABS 0.1 08/05/2017 0927   BASOSABS 0.0 02/13/2022 1250   BASOSABS 0.0 08/05/2017 0927   Comprehensive Metabolic Panel:    Component Value Date/Time   NA 141 02/13/2022 1250   NA 141 08/05/2017 0927   K 4.2 02/13/2022 1250   K 4.0 08/05/2017 0927   CL 103 02/13/2022 1250  CL 101 02/07/2013 1125   CO2 34 (H) 02/13/2022 1250   CO2 31 (H) 08/05/2017 0927   BUN 14 02/13/2022 1250   BUN 22.7 08/05/2017 0927   CREATININE 0.66 02/13/2022 1250   CREATININE 0.8 08/05/2017 0927   GLUCOSE 130 (H) 02/13/2022 1250   GLUCOSE 125 08/05/2017 0927   GLUCOSE 193 (H) 02/07/2013 1125   CALCIUM 9.7 02/13/2022 1250   CALCIUM 9.7 08/05/2017 0927   AST 45 (H) 02/13/2022 1250   AST 19 08/05/2017 0927   ALT 59 (H) 02/13/2022 1250   ALT 28 08/05/2017 0927   ALKPHOS 78 02/13/2022 1250   ALKPHOS 108 08/05/2017 0927   BILITOT 0.5 02/13/2022 1250   BILITOT 0.34 08/05/2017 0927   PROT 7.3 02/13/2022 1250   PROT 7.3 08/05/2017 0927   ALBUMIN 3.8 02/13/2022 1250   ALBUMIN 3.2 (L) 08/05/2017 0927    RADIOGRAPHIC STUDIES: No results found.  PERFORMANCE STATUS (ECOG) : 1 - Symptomatic but completely ambulatory  Review of Systems  Musculoskeletal:  Positive for arthralgias and back pain.  Unless otherwise noted, a complete review of systems is negative.   IMPRESSION: This is my initial visit with [name]. No acute distress noted.   I introduced myself, Maygan RN, and Palliative's role in collaboration with the oncology team. Concept of Palliative Care was introduced as specialized medical care for people and their families living with serious illness.  It focuses on providing relief from the symptoms and stress of a  serious illness.  The goal is to improve quality of life for both the patient and the family. Values and goals of care important to patient and family were attempted to be elicited.   [name] lives in the home with her son, daughter, and 53 year old grandson. Widowed. 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   Ambulatory and able to perform all ADLs independently. She does not drive. Appetite is good.   Chronic Pain/Neuropathy  [name] complains of severe back pain which at times radiates down her legs and across shoulders. She shares she has "dealt" with pain since her early 11s however over the past month pain has worsened and become intolerable. She has been on lyrica per her PCP however with not much of noticeable difference.   We discussed at length. She reports increased activity and lying down causes her pain to intensify. Nothing makes pain better. [name] states she was on Oxycodone ER and Oxy IR many years ago however was able to wean off. She does not like taking medications however her pain is now altering her quality of life and where she was once able to manage this is now not the case.   Education provided on pain management with a goal of decreasing severity and increasing her quality of life. Plan of care discussed at length. Patient verbalized understanding. Instructed to increase Lyrica to 57m twice daily, will start on Xtampza ER 953m with Oxy IR 63m109mvailable for breakthrough. She will increase her Lyrica with night dose tonight and begin Xtampza tomorrow morning. [name] to contact our office if not tolerating or pain worsens. She was provided with office contact information.   Constipation  Denies concerns with constipation. Education provided on use of Miralax daily to prevent constipation in the setting of opioid use.   I discussed the importance of continued conversation with family and their medical providers regarding  overall plan of care and treatment options,  ensuring decisions are within the context of the patients values and GOCs.  PLAN: Establish therapeutic relationship.  Education provided role in collaboration with her oncology team.   Increased Lyrica to 50 mg twice daily.  Patient was previously taking 25 mg twice daily.  She is aware to increase dosage starting tonight.   Oxycodone ER (Xtampza) 9 mg patient to take every 12 hours  Oxy IR 5 mg every 6 hours as needed for breakthrough pain.   Education provided on the use of opioids to manage pain.  She understands the goal is to decrease her levels of pain and increase her quality of life.  Understands realistically we would not be able to completely resolve her pain issues but hopefully get it to a point where it is manageable.   MiraLAX daily for bowel regimen in the setting of opioid use  I will plan to see patient back in 2-4 weeks in collaboration to other oncology appointments.    Patient expressed understanding and was in agreement with this plan. She also understands that She can call the clinic at any time with any questions, concerns, or complaints.   Thank you for your referral and allowing Palliative to assist in [name]'s care.   Number and complexity of problems addressed: 2 HIGH - 1 or more chronic illnesses with SEVERE exacerbation, progression, or side effects of treatment - advanced cancer, pain. Any controlled substances utilized were prescribed in the context of palliative care.  Time Total: 50 min   Visit consisted of counseling and education dealing with the complex and emotionally intense issues of symptom management and palliative care in the setting of serious and potentially life-threatening illness.Greater than 50%  of this time was spent counseling and coordinating care related to the above assessment and plan.  Signed by: Alda Lea, AGPCNP-BC Palliative Medicine Team/[facility] [place]

## 2022-02-19 ENCOUNTER — Other Ambulatory Visit (HOSPITAL_COMMUNITY): Payer: Self-pay

## 2022-02-19 ENCOUNTER — Telehealth: Payer: Self-pay

## 2022-02-19 ENCOUNTER — Encounter: Payer: Self-pay | Admitting: Hematology and Oncology

## 2022-02-19 MED ORDER — ONETOUCH ULTRA 2 W/DEVICE KIT: 1.0000 | PACK | Freq: Four times a day (QID) | 0 refills | Status: DC

## 2022-02-19 NOTE — Telephone Encounter (Signed)
Called pt to check in, pt reports that she is unable to take xtampza as it makes her incredibly nauseous and when she took it she experienced several episodes of vomiting. Per Lexine Baton, NP pt was instructed to stop taking xtapza and to take 2 of her oxycodone IR (10mg  total) every 4-6hours. Pt verbalized understanding. Coordinated a call to check in on this Friday, pt agreed. No further needs at this time.

## 2022-02-21 ENCOUNTER — Telehealth: Payer: Self-pay

## 2022-02-21 MED ORDER — DULOXETINE HCL 30 MG PO CPEP: 30.0000 mg | ORAL_CAPSULE | Freq: Every day | ORAL | 0 refills | Status: DC

## 2022-02-21 NOTE — Telephone Encounter (Signed)
Called to check in on pt, pain slightly improved, still having nausea. Per Jenna Baton, NP pt to take 2 Zofran (8mg  total) as needed. Pt also to start Cymbalta, and instructed to take Zofran before Cymbalta. Pt verbalized understanding, no further concerns at this time.

## 2022-02-23 DIAGNOSIS — E1149 Type 2 diabetes mellitus with other diabetic neurological complication: Secondary | ICD-10-CM | POA: Diagnosis not present

## 2022-02-24 ENCOUNTER — Telehealth: Payer: Self-pay

## 2022-02-24 NOTE — Telephone Encounter (Signed)
Patient calls nurse line stating that she no longer wants to take Ozempic due to side effects. Patient reports that she has nausea, diarrhea and back pain with medication.   Patient reports that she is due for weekly dose today and would like to speak with Dr. Erin Hearing before administering injection.   Forwarding to PCP.   Talbot Grumbling, RN

## 2022-02-27 NOTE — Telephone Encounter (Signed)
Called and left vm for her to come in for an office visit to check A1c and discuss therapies

## 2022-03-10 ENCOUNTER — Other Ambulatory Visit: Payer: Self-pay

## 2022-03-10 DIAGNOSIS — R062 Wheezing: Secondary | ICD-10-CM | POA: Diagnosis not present

## 2022-03-10 DIAGNOSIS — I5020 Unspecified systolic (congestive) heart failure: Secondary | ICD-10-CM | POA: Diagnosis not present

## 2022-03-10 DIAGNOSIS — U071 COVID-19: Secondary | ICD-10-CM | POA: Diagnosis not present

## 2022-03-10 DIAGNOSIS — M25569 Pain in unspecified knee: Secondary | ICD-10-CM | POA: Diagnosis not present

## 2022-03-13 ENCOUNTER — Inpatient Hospital Stay: Payer: Medicare Other

## 2022-03-13 ENCOUNTER — Inpatient Hospital Stay: Payer: Medicare Other | Admitting: Hematology and Oncology

## 2022-03-13 ENCOUNTER — Other Ambulatory Visit: Payer: Self-pay

## 2022-03-13 ENCOUNTER — Encounter: Payer: Self-pay | Admitting: Nurse Practitioner

## 2022-03-13 ENCOUNTER — Inpatient Hospital Stay (HOSPITAL_BASED_OUTPATIENT_CLINIC_OR_DEPARTMENT_OTHER): Payer: Medicare Other | Admitting: Nurse Practitioner

## 2022-03-13 VITALS — BP 138/59 | HR 65 | Temp 97.7°F | Resp 18 | Wt 310.0 lb

## 2022-03-13 DIAGNOSIS — C50919 Malignant neoplasm of unspecified site of unspecified female breast: Secondary | ICD-10-CM

## 2022-03-13 DIAGNOSIS — R5382 Chronic fatigue, unspecified: Secondary | ICD-10-CM | POA: Diagnosis not present

## 2022-03-13 DIAGNOSIS — Z170 Estrogen receptor positive status [ER+]: Secondary | ICD-10-CM

## 2022-03-13 DIAGNOSIS — F419 Anxiety disorder, unspecified: Secondary | ICD-10-CM | POA: Diagnosis not present

## 2022-03-13 DIAGNOSIS — K5900 Constipation, unspecified: Secondary | ICD-10-CM | POA: Diagnosis not present

## 2022-03-13 DIAGNOSIS — K5903 Drug induced constipation: Secondary | ICD-10-CM | POA: Diagnosis not present

## 2022-03-13 DIAGNOSIS — Z7189 Other specified counseling: Secondary | ICD-10-CM | POA: Diagnosis not present

## 2022-03-13 DIAGNOSIS — G893 Neoplasm related pain (acute) (chronic): Secondary | ICD-10-CM

## 2022-03-13 DIAGNOSIS — Z515 Encounter for palliative care: Secondary | ICD-10-CM

## 2022-03-13 DIAGNOSIS — Z5112 Encounter for antineoplastic immunotherapy: Secondary | ICD-10-CM | POA: Diagnosis not present

## 2022-03-13 DIAGNOSIS — C7800 Secondary malignant neoplasm of unspecified lung: Secondary | ICD-10-CM

## 2022-03-13 DIAGNOSIS — I10 Essential (primary) hypertension: Secondary | ICD-10-CM | POA: Diagnosis not present

## 2022-03-13 DIAGNOSIS — C50212 Malignant neoplasm of upper-inner quadrant of left female breast: Secondary | ICD-10-CM | POA: Diagnosis not present

## 2022-03-13 DIAGNOSIS — Z87891 Personal history of nicotine dependence: Secondary | ICD-10-CM | POA: Diagnosis not present

## 2022-03-13 DIAGNOSIS — E119 Type 2 diabetes mellitus without complications: Secondary | ICD-10-CM | POA: Diagnosis not present

## 2022-03-13 DIAGNOSIS — Z95828 Presence of other vascular implants and grafts: Secondary | ICD-10-CM

## 2022-03-13 LAB — CBC WITH DIFFERENTIAL (CANCER CENTER ONLY)
Abs Immature Granulocytes: 0.02 10*3/uL (ref 0.00–0.07)
Basophils Absolute: 0 10*3/uL (ref 0.0–0.1)
Basophils Relative: 1 %
Eosinophils Absolute: 0.1 10*3/uL (ref 0.0–0.5)
Eosinophils Relative: 2 %
HCT: 40 % (ref 36.0–46.0)
Hemoglobin: 13.6 g/dL (ref 12.0–15.0)
Immature Granulocytes: 0 %
Lymphocytes Relative: 33 %
Lymphs Abs: 1.9 10*3/uL (ref 0.7–4.0)
MCH: 30.4 pg (ref 26.0–34.0)
MCHC: 34 g/dL (ref 30.0–36.0)
MCV: 89.3 fL (ref 80.0–100.0)
Monocytes Absolute: 0.4 10*3/uL (ref 0.1–1.0)
Monocytes Relative: 6 %
Neutro Abs: 3.4 10*3/uL (ref 1.7–7.7)
Neutrophils Relative %: 58 %
Platelet Count: 222 10*3/uL (ref 150–400)
RBC: 4.48 MIL/uL (ref 3.87–5.11)
RDW: 14 % (ref 11.5–15.5)
WBC Count: 5.9 10*3/uL (ref 4.0–10.5)
nRBC: 0 % (ref 0.0–0.2)

## 2022-03-13 LAB — CMP (CANCER CENTER ONLY)
ALT: 56 U/L — ABNORMAL HIGH (ref 0–44)
AST: 39 U/L (ref 15–41)
Albumin: 3.7 g/dL (ref 3.5–5.0)
Alkaline Phosphatase: 79 U/L (ref 38–126)
Anion gap: 5 (ref 5–15)
BUN: 18 mg/dL (ref 8–23)
CO2: 34 mmol/L — ABNORMAL HIGH (ref 22–32)
Calcium: 9.2 mg/dL (ref 8.9–10.3)
Chloride: 103 mmol/L (ref 98–111)
Creatinine: 0.72 mg/dL (ref 0.44–1.00)
GFR, Estimated: >60 mL/min (ref >60)
Glucose, Bld: 247 mg/dL — ABNORMAL HIGH (ref 70–99)
Potassium: 4.3 mmol/L (ref 3.5–5.1)
Sodium: 142 mmol/L (ref 135–145)
Total Bilirubin: 0.4 mg/dL (ref 0.3–1.2)
Total Protein: 7.2 g/dL (ref 6.5–8.1)

## 2022-03-13 MED ORDER — DIPHENHYDRAMINE HCL 25 MG PO CAPS
25.0000 mg | ORAL_CAPSULE | Freq: Once | ORAL | Status: AC
  Administered 2022-03-13: 25 mg via ORAL
  Filled 2022-03-13: qty 1

## 2022-03-13 MED ORDER — TRASTUZUMAB-ANNS CHEMO 150 MG IV SOLR
750.0000 mg | Freq: Once | INTRAVENOUS | Status: AC
  Administered 2022-03-13: 750 mg via INTRAVENOUS
  Filled 2022-03-13: qty 35.72

## 2022-03-13 MED ORDER — ONDANSETRON HCL 8 MG PO TABS: 8.0000 mg | ORAL_TABLET | Freq: Three times a day (TID) | ORAL | 0 refills | Status: DC | PRN

## 2022-03-13 MED ORDER — SODIUM CHLORIDE 0.9% FLUSH
10.0000 mL | Freq: Once | INTRAVENOUS | Status: AC
  Administered 2022-03-13: 10 mL via INTRAVENOUS

## 2022-03-13 MED ORDER — OXYCODONE HCL 5 MG PO TABS: 5.0000 mg | ORAL_TABLET | Freq: Four times a day (QID) | ORAL | 0 refills | Status: DC | PRN

## 2022-03-13 MED ORDER — HEPARIN SOD (PORK) LOCK FLUSH 100 UNIT/ML IV SOLN
500.0000 [IU] | Freq: Once | INTRAVENOUS | Status: AC | PRN
  Administered 2022-03-13: 500 [IU]

## 2022-03-13 MED ORDER — ACETAMINOPHEN 325 MG PO TABS
650.0000 mg | ORAL_TABLET | Freq: Once | ORAL | Status: AC
  Administered 2022-03-13: 650 mg via ORAL
  Filled 2022-03-13: qty 2

## 2022-03-13 MED ORDER — SODIUM CHLORIDE 0.9% FLUSH
10.0000 mL | Freq: Once | INTRAVENOUS | Status: AC
  Administered 2022-03-13: 10 mL

## 2022-03-13 MED ORDER — PREGABALIN 50 MG PO CAPS: 50.0000 mg | ORAL_CAPSULE | Freq: Two times a day (BID) | ORAL | 1 refills | Status: DC

## 2022-03-13 MED ORDER — DIAZEPAM 5 MG PO TABS: 5.0000 mg | ORAL_TABLET | Freq: Two times a day (BID) | ORAL | 0 refills | Status: DC | PRN

## 2022-03-13 MED ORDER — SODIUM CHLORIDE 0.9 % IV SOLN: Freq: Once | INTRAVENOUS | Status: AC

## 2022-03-13 MED ORDER — LORAZEPAM 2 MG/ML IJ SOLN
0.5000 mg | Freq: Once | INTRAMUSCULAR | Status: AC
  Administered 2022-03-13: 0.5 mg via INTRAVENOUS
  Filled 2022-03-13: qty 1

## 2022-03-13 NOTE — Progress Notes (Signed)
[facility]  Telephone:(336) [phone] Fax:(336) [phone]   Name: [name] Date: 03/13/2022 MRN: [id number]  DOB: [birth year]  Patient Care Team: [name], MD as PCP - General (Family Medicine) Larey Dresser, MD as Consulting Physician (Cardiology) Benay Pike, MD as Consulting Physician (Hematology and Oncology)    INTERVAL HISTORY: [name] is a 71 y.o. female with  medical history including metastatic breast cancer with numerous pulmonary nodules bilaterally s/p chemotherapy currently on herceptin for maintenance. Now with complaints of uncontrolled back pain.  Palliative ask to see for symptom management.   SOCIAL HISTORY:     reports that she quit smoking about 53 years ago. Her smoking use included cigarettes. She has a 15.00 pack-year smoking history. She has never used smokeless tobacco. She reports that she does not drink alcohol and does not use drugs.  ADVANCE DIRECTIVES:    CODE STATUS:   PAST MEDICAL HISTORY: Past Medical History:  Diagnosis Date   Arthritis    Back pain    Breast cancer ([place]) dx'd 11/2012   left   Chest pain    COPD (chronic obstructive pulmonary disease) ([place])    Diabetes mellitus without complication ([place]) 03/20/2121   Ear pain    Fatty liver 6/03   Hypertension    Lung disease    Lung metastases dx'd 11/2012   Obesity, unspecified    Other abnormal glucose    Suicide attempt ([place]) 1996   Syncope and collapse    Unspecified sleep apnea     ALLERGIES:  is allergic to meperidine hcl, penicillins, amoxicillin, aspirin, and percocet [oxycodone-acetaminophen].  MEDICATIONS:  Current Outpatient Medications  Medication Sig Dispense Refill   diazepam (VALIUM) 5 MG tablet Take 1 tablet (5 mg total) by mouth every 12 (twelve) hours as needed for anxiety. 30 tablet 0   ondansetron (ZOFRAN) 8 MG tablet Take 1 tablet (8 mg total) by mouth every 8 (eight) hours as needed for nausea  or vomiting. 20 tablet 0   acetaminophen (TYLENOL) 325 MG tablet Take 2 tablets (650 mg total) by mouth every 6 (six) hours as needed for up to 21 doses for moderate pain or mild pain. 21 tablet 0   albuterol (VENTOLIN HFA) 108 (90 Base) MCG/ACT inhaler INHALE 2 PUFFS INTO THE LUNGS EVERY 6 HOURS AS NEEDED FOR WHEEZE 8.5 each 1   Blood Glucose Monitoring Suppl (ONE TOUCH ULTRA 2) w/Device KIT 1 kit by Does not apply route QID. ICD 10-code: E11.49. 1 kit 0   DULoxetine (CYMBALTA) 30 MG capsule Take 1 capsule (30 mg total) by mouth daily. 30 capsule 0   fluconazole (DIFLUCAN) 100 MG tablet Take 1 tablet (100 mg total) by mouth daily. 10 tablet 1   glucose blood (FREESTYLE PRECISION NEO TEST) test strip Use as instructed to check once daily 100 each 12   ibuprofen (ADVIL) 600 MG tablet Take 1 tablet (600 mg total) by mouth every 8 (eight) hours as needed for up to 21 doses for mild pain or moderate pain. 21 tablet 0   insulin glargine (LANTUS SOLOSTAR) 100 UNIT/ML Solostar Pen Inject 32 Units into the skin daily. 15 mL 6   insulin lispro (HUMALOG KWIKPEN) 100 UNIT/ML KwikPen INJECT 10 UNITS INTO THE SKIN 3 TIMES A DAY WITH MEALS- 3 mL 1   Insulin Pen Needle (PEN NEEDLES) 30G X 8 MM MISC Please use to inject insulin 4 times daily. 200 each 3   ketoconazole (  NIZORAL) 2 % cream Apply 1 application. topically daily. 15 g 0   letrozole (FEMARA) 2.5 MG tablet Take 1 tablet (2.5 mg total) by mouth daily. 90 tablet 12   loperamide (IMODIUM) 2 MG capsule TAKE 1 CAPSULE (2 MG TOTAL) BY MOUTH AS NEEDED FOR DIARRHEA OR LOOSE STOOLS. 30 capsule 1   metFORMIN (GLUCOPHAGE-XR) 500 MG 24 hr tablet TAKE 1 TABLET (500 MG TOTAL) BY MOUTH IN THE MORNING AND AT BEDTIME. 180 tablet 1   oxyCODONE (OXY IR/ROXICODONE) 5 MG immediate release tablet Take 1 tablet (5 mg total) by mouth every 6 (six) hours as needed for severe pain. 90 tablet 0   OXYGEN Inhale into the lungs.     pregabalin (LYRICA) 50 MG capsule Take 1 capsule (50  mg total) by mouth 2 (two) times daily. 60 capsule 1   Semaglutide, 1 MG/DOSE, (OZEMPIC, 1 MG/DOSE,) 4 MG/3ML SOPN INJECT 1 MG INTO THE SKIN ONE TIME PER WEEK 3 mL 3   No current facility-administered medications for this visit.    VITAL SIGNS: There were no vitals taken for this visit. There were no vitals filed for this visit.  Estimated body mass index is 53.21 kg/m as calculated from the following:   Height as of 12/19/21: _0  (1.626 m).   Weight as of an earlier encounter on 03/13/22: 310 lb (140.6 kg).   PERFORMANCE STATUS (ECOG) : 1 - Symptomatic but completely ambulatory   Physical Exam General: NAD, sitting in recliner Pulmonary: normal breathing pattern, 2L/ Extremities: no edema, no joint deformities Skin: no rashes Neurological: Weakness but otherwise nonfocal  IMPRESSION:  I saw [name] during her infusion today. She is sitting upright in recliner. Tolerating treatment. No acute distress noted. Oxygen in place.   She shares that she is feeling well. Ongoing back pain and generalized discomfort.   Neoplasm/Chronic/Neuropathic pain [name] states her pain is ongoing. She unfortunately did not feel as though she was tolerating Oxycodone ER and has discontinued use. She was experiencing nausea and vomiting although she also is relating this to her recent start on ozempic which she has also stopped taking. She has zofran on hand which was helpful with her symptoms. She is not interested in restartng the oxycodone ER.   We discussed her use of Oxy IR as needed for pain. She is tolerating well. Feels some improvement in neuropathic pain since increasing pregablin to 50 mg twice daily. Reports she also has stopped taking Cymbalta after reading potential side effects. We discussed at length all medications have the potential to cause side effects. She did take medication for over a week without concern. Extensive education provided on use of cymbalta in correlation with her  pregablin for pain management specifically given she does not wish to restart long-acting regimen. [name] verbalized understanding and is in agreement to restart.   We will continue to closely monitor and adjust medications accordingly.   Constipation Reports regular bowel movements. Ongoing education provided on the use of miralax daily to prevent constipation.   Anxiety  We discussed Her current illness and what it means in the larger context of Her on-going co-morbidities. Natural disease trajectory and expectations were discussed.  She becomes tearful expressing her disease progression over time. She reports her children are aware of her condition to an extent however she chooses not to "tell them" everything until she knows she is facing end-of-life. I created space and opportunity to allow her to express her thoughts and feelings. [name] states  she is having some anxiety to the point she feels as though it is hard to breath and some nervousness. This occurs as she thinks often about her cancer, not if but when will she have to face end-of-life, and how much longer will she make it. Emotional support provided.   Education provided on use of benzodiazepines short-term vs long-term. I discussed at length use of valium in addition to other non-pharmacological therapeutic methods. She verbalized understanding and appreciation.   We will continue to closely monitor and support.    I discussed the importance of continued conversation with family and their medical providers regarding overall plan of care and treatment options, ensuring decisions are within the context of the patients values and GOCs.  PLAN: Oxy IR as needed for breakthrough pain Cymbalta daily  Declines to restart or try different medication focused on long-acting pain relief due to potential symptoms.  Valium twice daily for anxiety  Miralax daily for bowel regimen Ongoing goals of care discussions and support I will plan to see  her back in the office in 3-4 weeks in collaboration with her other oncology appointments. Sooner if needed.    Patient expressed understanding and was in agreement with this plan. She also understands that She can call the clinic at any time with any questions, concerns, or complaints.     Time Total: 45 min   Visit consisted of counseling and education dealing with the complex and emotionally intense issues of symptom management and palliative care in the setting of serious and potentially life-threatening illness.Greater than 50%  of this time was spent counseling and coordinating care related to the above assessment and plan.  Signed by: Alda Lea, AGPCNP-BC [place]

## 2022-03-13 NOTE — Patient Instructions (Signed)
[facility]   Discharge Instructions: Thank you for choosing [facility] to provide your oncology and hematology care.   If you have a lab appointment with the [facility], please go directly to the [facility] and check in at the registration area.   Wear comfortable clothing and clothing appropriate for easy access to any Portacath or PICC line.   We strive to give you quality time with your provider. You may need to reschedule your appointment if you arrive late (15 or more minutes).  Arriving late affects you and other patients whose appointments are after yours.  Also, if you miss three or more appointments without notifying the office, you may be dismissed from the clinic at the provider's discretion.      For prescription refill requests, have your pharmacy contact our office and allow 72 hours for refills to be completed.    Today you received the following chemotherapy and/or immunotherapy agents: Trastuzumab (Herceptin)       To help prevent nausea and vomiting after your treatment, we encourage you to take your nausea medication as directed.  BELOW ARE SYMPTOMS THAT SHOULD BE REPORTED IMMEDIATELY: *FEVER GREATER THAN 100.4 F (38 C) OR HIGHER *CHILLS OR SWEATING *NAUSEA AND VOMITING THAT IS NOT CONTROLLED WITH YOUR NAUSEA MEDICATION *UNUSUAL SHORTNESS OF BREATH *UNUSUAL BRUISING OR BLEEDING *URINARY PROBLEMS (pain or burning when urinating, or frequent urination) *BOWEL PROBLEMS (unusual diarrhea, constipation, pain near the anus) TENDERNESS IN MOUTH AND THROAT WITH OR WITHOUT PRESENCE OF ULCERS (sore throat, sores in mouth, or a toothache) UNUSUAL RASH, SWELLING OR PAIN  UNUSUAL VAGINAL DISCHARGE OR ITCHING   Items with * indicate a potential emergency and should be followed up as soon as possible or go to the Emergency Department if any problems should occur.  Please show the CHEMOTHERAPY ALERT CARD or IMMUNOTHERAPY ALERT  CARD at check-in to the Emergency Department and triage nurse.  Should you have questions after your visit or need to cancel or reschedule your appointment, please contact [facility]  Dept: [phone]  and follow the prompts.  Office hours are 8:00 a.m. to 4:30 p.m. Monday - Friday. Please note that voicemails left after 4:00 p.m. may not be returned until the following business day.  We are closed weekends and major holidays. You have access to a nurse at all times for urgent questions. Please call the main number to the clinic Dept: [phone] and follow the prompts.   For any non-urgent questions, you may also contact your provider using MyChart. We now offer e-Visits for anyone 27 and older to request care online for non-urgent symptoms. For details visit [email or web address].   Also download the MyChart app! Go to the app store, search "MyChart", open the app, select [facility], and log in with your MyChart username and password.  Masks are optional in the cancer centers. If you would like for your care team to wear a mask while they are taking care of you, please let them know. For doctor visits, patients may have with them one support person who is at least 72 years old. At this time, visitors are not allowed in the infusion area.

## 2022-03-17 ENCOUNTER — Other Ambulatory Visit: Payer: Self-pay

## 2022-03-20 ENCOUNTER — Encounter: Payer: Self-pay | Admitting: Hematology and Oncology

## 2022-03-21 ENCOUNTER — Telehealth: Payer: Self-pay

## 2022-03-21 DIAGNOSIS — E1149 Type 2 diabetes mellitus with other diabetic neurological complication: Secondary | ICD-10-CM

## 2022-03-21 NOTE — Telephone Encounter (Signed)
Pls contact patient and let her know she can increase her lantus by one unit every morning her fasting blood sugar is greater than 150 up to 35 units  Pls ask her to bring in all her medications and meters for her visit  Thanks [facility]

## 2022-03-21 NOTE — Telephone Encounter (Signed)
Called patient and gave her message and she would like for Dr. Erin Hearing to call her.  Informed patient of increasing her lantus per PCP but patient is inquiring about humalog as well.  See previous note.  Same issues are persisting.  Ozella Almond, [place]

## 2022-03-21 NOTE — Telephone Encounter (Signed)
Patient calls nurse line requesting an increase in insulin.  Patient reports fasting cbgs are ~145 and after eating cbgs have been ~200. Patient reports average glucose on her meter is 154.  Patient reports she can not eat "diabetic food" due to circumstance, affordability and living with her grandson.  Patient is requesting an increase in units for lantus and humalog. Currently 32 units of lantus daily and 13 units of humalog TID.   Patient has stopped Ozempic all together due to undesired side effects.   Patient denies any symptoms at this time.   Patient has an upcoming apt with PCP on 8/22, however does not want to wait that long for possible increase. Patient reports, "I am afraid to eat."   Red flags discussed with patient.   Will forward to PCP.

## 2022-03-25 NOTE — Telephone Encounter (Signed)
Unable to tolerate metformin or ozempic so stopped these  Blood sugar ranging from 170-240 on just insulin   Using Lantus 35 units Humalog 15 three times a day   Bring readings at your next visit  Call if too low

## 2022-03-25 NOTE — Assessment & Plan Note (Signed)
Unable to tolerate metformin and ozempic

## 2022-03-26 DIAGNOSIS — E1149 Type 2 diabetes mellitus with other diabetic neurological complication: Secondary | ICD-10-CM | POA: Diagnosis not present

## 2022-04-01 ENCOUNTER — Other Ambulatory Visit: Payer: Self-pay | Admitting: Nurse Practitioner

## 2022-04-02 ENCOUNTER — Other Ambulatory Visit: Payer: Self-pay

## 2022-04-03 MED ORDER — LANTUS SOLOSTAR 100 UNIT/ML ~~LOC~~ SOPN
32.0000 [IU] | PEN_INJECTOR | Freq: Every day | SUBCUTANEOUS | 6 refills | Status: DC
Start: 2022-04-03 — End: 2022-04-15

## 2022-04-04 ENCOUNTER — Other Ambulatory Visit: Payer: Self-pay | Admitting: *Deleted

## 2022-04-07 MED ORDER — INSULIN LISPRO (1 UNIT DIAL) 100 UNIT/ML (KWIKPEN): PEN_INJECTOR | SUBCUTANEOUS | 1 refills | Status: DC

## 2022-04-08 ENCOUNTER — Other Ambulatory Visit: Payer: Self-pay

## 2022-04-08 ENCOUNTER — Ambulatory Visit (INDEPENDENT_AMBULATORY_CARE_PROVIDER_SITE_OTHER): Payer: Medicare Other | Admitting: Family Medicine

## 2022-04-08 ENCOUNTER — Encounter: Payer: Self-pay | Admitting: Family Medicine

## 2022-04-08 VITALS — BP 162/60 | HR 60 | Wt 315.6 lb

## 2022-04-08 DIAGNOSIS — F411 Generalized anxiety disorder: Secondary | ICD-10-CM | POA: Diagnosis not present

## 2022-04-08 DIAGNOSIS — E1149 Type 2 diabetes mellitus with other diabetic neurological complication: Secondary | ICD-10-CM | POA: Diagnosis not present

## 2022-04-08 LAB — POCT GLYCOSYLATED HEMOGLOBIN (HGB A1C): HbA1c, POC (controlled diabetic range): 6.3 % (ref 0.0–7.0)

## 2022-04-08 NOTE — Assessment & Plan Note (Signed)
Worsened.  Likely due to her increased insulin and sedentary life

## 2022-04-08 NOTE — Progress Notes (Signed)
    SUBJECTIVE:   CHIEF COMPLAINT / HPI:   Diabetes Unable to tolerate metformin or ozempic so stopped these  Blood sugar ranging from 120-240 on just insulin.  Here weekly average from her CBM is about 180  Using Lantus 35 units Humalog 16 three times a day - sometimes takes 4 x a day when forgets No severe low blood sugar but does eat sometimes when feels is getting low  Weight Has gained 5 lb since end of July   PERTINENT  PMH / PSH: mostly WC dependent and on oxygen   OBJECTIVE:   BP (!) 162/60   Pulse 60   Wt (!) 315 lb 9.6 oz (143.2 kg)   SpO2 95%   BMI 54.17 kg/m   Good spirits Her daughter is with her [facility] wearing oxygen Diabetic Foot Check -  Appearance - no lesions, ulcers or calluses Skin - no unusual pallor or redness are moist and has vaseline Diffuse lipidema   ASSESSMENT/PLAN:   Diabetes mellitus type 2 with neurological manifestations (HCC) Her blood sugar are at (or perhaps below) goal but I am concerned using only insulin will cause increased weight gain and that she is missing the benefits of GLP1s and perhaps SGLT2.  She stopped ozempic after a while relating it causes cramps and back pain.  Interestingly she seemed to tolerate it well when she was meeting with Ernst Bowler regularly.   It is unclear why she stopped Jardiance (she does not like medications in general)   We had long discussion about risks of just insulin and that her A1c and average blood sugar is good given her comorbidities.  She agrees to meet with pharmacy again and perhaps try another GLP1  Encouraged her not to continue to go up on her insulin   Obesity, morbid, BMI 50 or higher ([place]) Worsened.  Likely due to her increased insulin and sedentary life    She is not interested in any preventive items   Patient Instructions  Good to see you today - Thank you for coming in  Keep the same level of insulin even if your blood sugar run a little high  To Keep you Healthy You   Need - Mammogram - Colon Cancer screening  You need an diabetes eye exam every year.  Please see your eye doctor.  Ask them to fax [place] a report of your exam   Please always bring your medication bottles  Make an appointment to see Dr Valentina Lucks to discuss decreasing insulin by restarting a GLP1 and or a SGLT2    [name], MD [facility]

## 2022-04-08 NOTE — Patient Instructions (Addendum)
Good to see you today - Thank you for coming in  Keep the same level of insulin even if your blood sugar run a little high  To Keep you Healthy You  Need - Mammogram - Colon Cancer screening  You need an diabetes eye exam every year.  Please see your eye doctor.  Ask them to fax [place] a report of your exam   Please always bring your medication bottles  Make an appointment to see Dr Valentina Lucks to discuss decreasing insulin by restarting a GLP1 and or a SGLT2

## 2022-04-08 NOTE — Assessment & Plan Note (Signed)
Her blood sugar are at (or perhaps below) goal but I am concerned using only insulin will cause increased weight gain and that she is missing the benefits of GLP1s and perhaps SGLT2.  She stopped ozempic after a while relating it causes cramps and back pain.  Interestingly she seemed to tolerate it well when she was meeting with Ernst Bowler regularly.   It is unclear why she stopped Jardiance (she does not like medications in general)   We had long discussion about risks of just insulin and that her A1c and average blood sugar is good given her comorbidities.  She agrees to meet with pharmacy again and perhaps try another GLP1  Encouraged her not to continue to go up on her insulin

## 2022-04-09 DIAGNOSIS — R062 Wheezing: Secondary | ICD-10-CM | POA: Diagnosis not present

## 2022-04-09 DIAGNOSIS — M25569 Pain in unspecified knee: Secondary | ICD-10-CM | POA: Diagnosis not present

## 2022-04-09 DIAGNOSIS — I5020 Unspecified systolic (congestive) heart failure: Secondary | ICD-10-CM | POA: Diagnosis not present

## 2022-04-09 DIAGNOSIS — U071 COVID-19: Secondary | ICD-10-CM | POA: Diagnosis not present

## 2022-04-10 ENCOUNTER — Other Ambulatory Visit: Payer: Self-pay

## 2022-04-10 ENCOUNTER — Inpatient Hospital Stay (HOSPITAL_BASED_OUTPATIENT_CLINIC_OR_DEPARTMENT_OTHER): Payer: Medicare Other | Admitting: Hematology and Oncology

## 2022-04-10 ENCOUNTER — Inpatient Hospital Stay: Payer: Medicare Other

## 2022-04-10 ENCOUNTER — Encounter: Payer: Self-pay | Admitting: Hematology and Oncology

## 2022-04-10 ENCOUNTER — Inpatient Hospital Stay: Payer: Medicare Other | Attending: Oncology

## 2022-04-10 ENCOUNTER — Inpatient Hospital Stay (HOSPITAL_BASED_OUTPATIENT_CLINIC_OR_DEPARTMENT_OTHER): Payer: Medicare Other | Admitting: Nurse Practitioner

## 2022-04-10 VITALS — BP 141/57 | HR 57 | Temp 97.5°F | Resp 18 | Ht 64.0 in | Wt 317.4 lb

## 2022-04-10 DIAGNOSIS — Z171 Estrogen receptor negative status [ER-]: Secondary | ICD-10-CM | POA: Diagnosis not present

## 2022-04-10 DIAGNOSIS — C50212 Malignant neoplasm of upper-inner quadrant of left female breast: Secondary | ICD-10-CM | POA: Insufficient documentation

## 2022-04-10 DIAGNOSIS — I5020 Unspecified systolic (congestive) heart failure: Secondary | ICD-10-CM | POA: Diagnosis not present

## 2022-04-10 DIAGNOSIS — C50919 Malignant neoplasm of unspecified site of unspecified female breast: Secondary | ICD-10-CM

## 2022-04-10 DIAGNOSIS — Z5112 Encounter for antineoplastic immunotherapy: Secondary | ICD-10-CM | POA: Diagnosis not present

## 2022-04-10 DIAGNOSIS — Z86718 Personal history of other venous thrombosis and embolism: Secondary | ICD-10-CM | POA: Insufficient documentation

## 2022-04-10 DIAGNOSIS — M25569 Pain in unspecified knee: Secondary | ICD-10-CM | POA: Diagnosis not present

## 2022-04-10 DIAGNOSIS — C7800 Secondary malignant neoplasm of unspecified lung: Secondary | ICD-10-CM | POA: Diagnosis not present

## 2022-04-10 DIAGNOSIS — G893 Neoplasm related pain (acute) (chronic): Secondary | ICD-10-CM

## 2022-04-10 DIAGNOSIS — Z515 Encounter for palliative care: Secondary | ICD-10-CM | POA: Diagnosis not present

## 2022-04-10 DIAGNOSIS — R062 Wheezing: Secondary | ICD-10-CM | POA: Diagnosis not present

## 2022-04-10 DIAGNOSIS — Z7189 Other specified counseling: Secondary | ICD-10-CM

## 2022-04-10 DIAGNOSIS — Z170 Estrogen receptor positive status [ER+]: Secondary | ICD-10-CM

## 2022-04-10 DIAGNOSIS — U071 COVID-19: Secondary | ICD-10-CM | POA: Diagnosis not present

## 2022-04-10 DIAGNOSIS — Z95828 Presence of other vascular implants and grafts: Secondary | ICD-10-CM

## 2022-04-10 LAB — CMP (CANCER CENTER ONLY)
ALT: 58 U/L — ABNORMAL HIGH (ref 0–44)
AST: 40 U/L (ref 15–41)
Albumin: 3.7 g/dL (ref 3.5–5.0)
Alkaline Phosphatase: 92 U/L (ref 38–126)
Anion gap: 3 — ABNORMAL LOW (ref 5–15)
BUN: 16 mg/dL (ref 8–23)
CO2: 35 mmol/L — ABNORMAL HIGH (ref 22–32)
Calcium: 9.5 mg/dL (ref 8.9–10.3)
Chloride: 102 mmol/L (ref 98–111)
Creatinine: 0.51 mg/dL (ref 0.44–1.00)
GFR, Estimated: >60 mL/min (ref >60)
Glucose, Bld: 139 mg/dL — ABNORMAL HIGH (ref 70–99)
Potassium: 4.3 mmol/L (ref 3.5–5.1)
Sodium: 140 mmol/L (ref 135–145)
Total Bilirubin: 0.4 mg/dL (ref 0.3–1.2)
Total Protein: 7 g/dL (ref 6.5–8.1)

## 2022-04-10 LAB — CBC WITH DIFFERENTIAL (CANCER CENTER ONLY)
Abs Immature Granulocytes: 0.02 10*3/uL (ref 0.00–0.07)
Basophils Absolute: 0 10*3/uL (ref 0.0–0.1)
Basophils Relative: 1 %
Eosinophils Absolute: 0.2 10*3/uL (ref 0.0–0.5)
Eosinophils Relative: 3 %
HCT: 40.2 % (ref 36.0–46.0)
Hemoglobin: 13.2 g/dL (ref 12.0–15.0)
Immature Granulocytes: 0 %
Lymphocytes Relative: 35 %
Lymphs Abs: 2.2 10*3/uL (ref 0.7–4.0)
MCH: 29.4 pg (ref 26.0–34.0)
MCHC: 32.8 g/dL (ref 30.0–36.0)
MCV: 89.5 fL (ref 80.0–100.0)
Monocytes Absolute: 0.5 10*3/uL (ref 0.1–1.0)
Monocytes Relative: 7 %
Neutro Abs: 3.4 10*3/uL (ref 1.7–7.7)
Neutrophils Relative %: 54 %
Platelet Count: 197 10*3/uL (ref 150–400)
RBC: 4.49 MIL/uL (ref 3.87–5.11)
RDW: 14.2 % (ref 11.5–15.5)
WBC Count: 6.3 10*3/uL (ref 4.0–10.5)
nRBC: 0 % (ref 0.0–0.2)

## 2022-04-10 MED ORDER — ACETAMINOPHEN 325 MG PO TABS
650.0000 mg | ORAL_TABLET | Freq: Once | ORAL | Status: AC
  Administered 2022-04-10: 650 mg via ORAL
  Filled 2022-04-10: qty 2

## 2022-04-10 MED ORDER — SODIUM CHLORIDE 0.9% FLUSH
10.0000 mL | Freq: Once | INTRAVENOUS | Status: AC
  Administered 2022-04-10: 10 mL via INTRAVENOUS

## 2022-04-10 MED ORDER — SODIUM CHLORIDE 0.9 % IV SOLN: Freq: Once | INTRAVENOUS | Status: AC

## 2022-04-10 MED ORDER — LORAZEPAM 2 MG/ML IJ SOLN
0.5000 mg | Freq: Once | INTRAMUSCULAR | Status: AC
  Administered 2022-04-10: 0.5 mg via INTRAVENOUS
  Filled 2022-04-10: qty 1

## 2022-04-10 MED ORDER — SODIUM CHLORIDE 0.9% FLUSH
10.0000 mL | Freq: Once | INTRAVENOUS | Status: AC
  Administered 2022-04-10: 10 mL

## 2022-04-10 MED ORDER — TRASTUZUMAB-ANNS CHEMO 150 MG IV SOLR
750.0000 mg | Freq: Once | INTRAVENOUS | Status: AC
  Administered 2022-04-10: 750 mg via INTRAVENOUS
  Filled 2022-04-10: qty 35.72

## 2022-04-10 MED ORDER — HEPARIN SOD (PORK) LOCK FLUSH 100 UNIT/ML IV SOLN
500.0000 [IU] | Freq: Once | INTRAVENOUS | Status: AC | PRN
  Administered 2022-04-10: 500 [IU]

## 2022-04-10 MED ORDER — DIPHENHYDRAMINE HCL 25 MG PO CAPS
25.0000 mg | ORAL_CAPSULE | Freq: Once | ORAL | Status: AC
  Administered 2022-04-10: 25 mg via ORAL
  Filled 2022-04-10: qty 1

## 2022-04-10 NOTE — Addendum Note (Signed)
Addended by: Adaline Sill on: 04/10/2022 02:32 PM   Modules accepted: Orders
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
## 2022-04-10 NOTE — Patient Instructions (Signed)
[facility]   Discharge Instructions: Thank you for choosing [facility] to provide your oncology and hematology care.   If you have a lab appointment with the [facility], please go directly to the [facility] and check in at the registration area.   Wear comfortable clothing and clothing appropriate for easy access to any Portacath or PICC line.   We strive to give you quality time with your provider. You may need to reschedule your appointment if you arrive late (15 or more minutes).  Arriving late affects you and other patients whose appointments are after yours.  Also, if you miss three or more appointments without notifying the office, you may be dismissed from the clinic at the provider's discretion.      For prescription refill requests, have your pharmacy contact our office and allow 72 hours for refills to be completed.    Today you received the following chemotherapy and/or immunotherapy agents: Trastuzumab (Herceptin)       To help prevent nausea and vomiting after your treatment, we encourage you to take your nausea medication as directed.  BELOW ARE SYMPTOMS THAT SHOULD BE REPORTED IMMEDIATELY: *FEVER GREATER THAN 100.4 F (38 C) OR HIGHER *CHILLS OR SWEATING *NAUSEA AND VOMITING THAT IS NOT CONTROLLED WITH YOUR NAUSEA MEDICATION *UNUSUAL SHORTNESS OF BREATH *UNUSUAL BRUISING OR BLEEDING *URINARY PROBLEMS (pain or burning when urinating, or frequent urination) *BOWEL PROBLEMS (unusual diarrhea, constipation, pain near the anus) TENDERNESS IN MOUTH AND THROAT WITH OR WITHOUT PRESENCE OF ULCERS (sore throat, sores in mouth, or a toothache) UNUSUAL RASH, SWELLING OR PAIN  UNUSUAL VAGINAL DISCHARGE OR ITCHING   Items with * indicate a potential emergency and should be followed up as soon as possible or go to the Emergency Department if any problems should occur.  Please show the CHEMOTHERAPY ALERT CARD or IMMUNOTHERAPY ALERT  CARD at check-in to the Emergency Department and triage nurse.  Should you have questions after your visit or need to cancel or reschedule your appointment, please contact [facility]  Dept: [phone]  and follow the prompts.  Office hours are 8:00 a.m. to 4:30 p.m. Monday - Friday. Please note that voicemails left after 4:00 p.m. may not be returned until the following business day.  We are closed weekends and major holidays. You have access to a nurse at all times for urgent questions. Please call the main number to the clinic Dept: [phone] and follow the prompts.   For any non-urgent questions, you may also contact your provider using MyChart. We now offer e-Visits for anyone 62 and older to request care online for non-urgent symptoms. For details visit [email or web address].   Also download the MyChart app! Go to the app store, search "MyChart", open the app, select [facility], and log in with your MyChart username and password.  Masks are optional in the cancer centers. If you would like for your care team to wear a mask while they are taking care of you, please let them know. For doctor visits, patients may have with them one support person who is at least 72 years old. At this time, visitors are not allowed in the infusion area.

## 2022-04-11 ENCOUNTER — Encounter: Payer: Self-pay | Admitting: Hematology and Oncology

## 2022-04-11 ENCOUNTER — Encounter: Payer: Self-pay | Admitting: Nurse Practitioner

## 2022-04-11 NOTE — Progress Notes (Signed)
[facility]  [phone] [phone]   Name: [name] Date: 04/11/2022 MRN: [id number]  DOB: 1950-09-16  Patient Care Team: [name], MD as PCP - General (Family Medicine) Larey Dresser, MD as Consulting Physician (Cardiology) Benay Pike, MD as Consulting Physician (Hematology and Oncology)    INTERVAL HISTORY: [name] is a 71 y.o. female with  medical history including metastatic breast cancer with numerous pulmonary nodules bilaterally s/p chemotherapy currently on herceptin for maintenance. Now with complaints of uncontrolled back pain.  Palliative ask to see for symptom management.   SOCIAL HISTORY:     reports that she quit smoking about 53 years ago. Her smoking use included cigarettes. She has a 15.00 pack-year smoking history. She has never used smokeless tobacco. She reports that she does not drink alcohol and does not use drugs.  ADVANCE DIRECTIVES:    CODE STATUS:   PAST MEDICAL HISTORY: Past Medical History:  Diagnosis Date   Arthritis    Back pain    Breast cancer ([facility]) dx'd 11/2012   left   Chest pain    COPD (chronic obstructive pulmonary disease) ([facility])    Diabetes mellitus without complication ([facility]) 3/71/6967   Ear pain    Fatty liver 6/03   Hypertension    Lung disease    Lung metastases dx'd 11/2012   Obesity, unspecified    Other abnormal glucose    Suicide attempt ([facility]) 1996   Syncope and collapse    Unspecified sleep apnea     ALLERGIES:  is allergic to meperidine hcl, penicillins, amoxicillin, aspirin, and percocet [oxycodone-acetaminophen].  MEDICATIONS:  Current Outpatient Medications  Medication Sig Dispense Refill   albuterol (VENTOLIN HFA) 108 (90 Base) MCG/ACT inhaler INHALE 2 PUFFS INTO THE LUNGS EVERY 6 HOURS AS NEEDED FOR WHEEZE 8.5 each 1   Blood Glucose Monitoring Suppl (ONE TOUCH ULTRA 2) w/Device KIT 1 kit by Does not apply route QID. ICD  10-code: E11.49. 1 kit 0   diazepam (VALIUM) 5 MG tablet Take 1 tablet (5 mg total) by mouth every 12 (twelve) hours as needed for anxiety. 30 tablet 0   DULoxetine (CYMBALTA) 30 MG capsule TAKE 1 CAPSULE BY MOUTH EVERY DAY (Patient not taking: Reported on 04/08/2022) 30 capsule 0   fluconazole (DIFLUCAN) 100 MG tablet Take 1 tablet (100 mg total) by mouth daily. 10 tablet 1   glucose blood (FREESTYLE PRECISION NEO TEST) test strip Use as instructed to check once daily 100 each 12   insulin glargine (LANTUS SOLOSTAR) 100 UNIT/ML Solostar Pen Inject 32 Units into the skin daily. 15 mL 6   insulin lispro (HUMALOG KWIKPEN) 100 UNIT/ML KwikPen INJECT 10 UNITS INTO THE SKIN 3 TIMES A DAY WITH MEALS- 3 mL 1   Insulin Pen Needle (PEN NEEDLES) 30G X 8 MM MISC Please use to inject insulin 4 times daily. 200 each 3   ketoconazole (NIZORAL) 2 % cream Apply 1 application. topically daily. 15 g 0   letrozole (FEMARA) 2.5 MG tablet Take 1 tablet (2.5 mg total) by mouth daily. 90 tablet 12   loperamide (IMODIUM) 2 MG capsule TAKE 1 CAPSULE (2 MG TOTAL) BY MOUTH AS NEEDED FOR DIARRHEA OR LOOSE STOOLS. 30 capsule 1   ondansetron (ZOFRAN) 8 MG tablet Take 1 tablet (8 mg total) by mouth every 8 (eight) hours as needed for nausea or vomiting. 20 tablet 0   oxyCODONE (OXY IR/ROXICODONE) 5 MG immediate release tablet  Take 1 tablet (5 mg total) by mouth every 6 (six) hours as needed for severe pain. 90 tablet 0   OXYGEN Inhale into the lungs.     pregabalin (LYRICA) 50 MG capsule Take 1 capsule (50 mg total) by mouth 2 (two) times daily. 60 capsule 1   No current facility-administered medications for this visit.    VITAL SIGNS: There were no vitals taken for this visit. There were no vitals filed for this visit.  Estimated body mass index is 54.48 kg/m as calculated from the following:   Height as of an earlier encounter on 04/10/22: _0  (1.626 m).   Weight as of an earlier encounter on 04/10/22: 317 lb 6.4 oz (144  kg).   PERFORMANCE STATUS (ECOG) : 1 - Symptomatic but completely ambulatory   Physical Exam General: NAD, obese, sitting in recliner Pulmonary: normal breathing pattern, 2L/[place] Extremities: no edema, no joint deformities Skin: no rashes Neurological: AAOx3, mood appropriate   IMPRESSION:  I saw [name] during her infusion today. She is sitting upright in recliner. Tolerating treatment. No acute distress noted.   She shares that she is feeling well. Ongoing back pain and generalized discomfort.   Neoplasm/Chronic/Neuropathic pain [name] states her pain is ongoing. She unfortunately did not feel as though she was tolerating Oxycodone ER and has discontinued use. She was experiencing nausea and vomiting although she also is relating this to her recent start on ozempic which she has also stopped taking. She has zofran on hand which was helpful with her symptoms. She is not interested in restartng the oxycodone ER.   We discussed her use of Oxy IR as needed for pain. She is tolerating well. Feels some improvement in neuropathic pain since increasing pregablin to 50 mg twice daily. Self-discontinued the cymbalta despite discussions regarding potential improvement in pain.   We will continue to closely monitor and adjust medications accordingly.   Constipation Reports regular bowel movements. Ongoing education provided on the use of miralax daily to prevent constipation.   Anxiety  We discussed Her current illness and what it means in the larger context of Her on-going co-morbidities. Natural disease trajectory and expectations were discussed.  7/27: She becomes tearful expressing her disease progression over time. She reports her children are aware of her condition to an extent however she chooses not to "tell them" everything until she knows she is facing end-of-life. I created space and opportunity to allow her to express her thoughts and feelings. [name] states she is having some  anxiety to the point she feels as though it is hard to breath and some nervousness. This occurs as she thinks often about her cancer, not if but when will she have to face end-of-life, and how much longer will she make it. Emotional support provided.   Education provided on use of benzodiazepines short-term vs long-term. I discussed at length use of valium in addition to other non-pharmacological therapeutic methods. She verbalized understanding and appreciation.   We will continue to closely monitor and support.    I discussed the importance of continued conversation with family and their medical providers regarding overall plan of care and treatment options, ensuring decisions are within the context of the patients values and GOCs.  PLAN: Oxy IR as needed for breakthrough pain Declines to restart or try different medication focused on long-acting pain relief due to potential symptoms.  Valium twice daily for anxiety  Miralax daily for bowel regimen Ongoing goals of care discussions and support  I will plan to see her back in the office in 3-4 weeks in collaboration with her other oncology appointments. Sooner if needed.    Patient expressed understanding and was in agreement with this plan. She also understands that She can call the clinic at any time with any questions, concerns, or complaints.        Any controlled substances utilized were prescribed in the context of palliative care. PDMP has been reviewed.    Time Total: 30 min   Visit consisted of counseling and education dealing with the complex and emotionally intense issues of symptom management and palliative care in the setting of serious and potentially life-threatening illness.Greater than 50%  of this time was spent counseling and coordinating care related to the above assessment and plan.  Alda Lea, AGPCNP-BC  Palliative Medicine Team/ [facility]

## 2022-04-14 ENCOUNTER — Other Ambulatory Visit: Payer: Self-pay | Admitting: Nurse Practitioner

## 2022-04-15 ENCOUNTER — Telehealth: Payer: Self-pay

## 2022-04-15 ENCOUNTER — Encounter: Payer: Self-pay | Admitting: Hematology and Oncology

## 2022-04-15 ENCOUNTER — Ambulatory Visit (INDEPENDENT_AMBULATORY_CARE_PROVIDER_SITE_OTHER): Payer: Medicare Other | Admitting: Pharmacist

## 2022-04-15 DIAGNOSIS — E1149 Type 2 diabetes mellitus with other diabetic neurological complication: Secondary | ICD-10-CM | POA: Diagnosis not present

## 2022-04-15 MED ORDER — LANTUS SOLOSTAR 100 UNIT/ML ~~LOC~~ SOPN: 37.0000 [IU] | PEN_INJECTOR | Freq: Every day | SUBCUTANEOUS | 6 refills | Status: DC

## 2022-04-15 MED ORDER — SEMAGLUTIDE(0.25 OR 0.5MG/DOS) 2 MG/1.5ML ~~LOC~~ SOPN: 0.2500 mg | PEN_INJECTOR | SUBCUTANEOUS | 3 refills | Status: DC

## 2022-04-15 MED ORDER — INSULIN LISPRO (1 UNIT DIAL) 100 UNIT/ML (KWIKPEN): 18.0000 [IU] | PEN_INJECTOR | Freq: Three times a day (TID) | SUBCUTANEOUS | 1 refills | Status: DC

## 2022-04-15 NOTE — Telephone Encounter (Signed)
Left voicemail regarding patient's request to refill Cymbalta. Patient was advised to call palliative team back regarding refill since it is too early.

## 2022-04-15 NOTE — Progress Notes (Signed)
Reviewed: I agree with Dr. Koval's documentation and management. 

## 2022-04-15 NOTE — Assessment & Plan Note (Signed)
Diabetes longstanding currently somewhat controlled. Patient is able to verbalize appropriate hypoglycemia management plan. Medication adherence appears good. Control is suboptimal due to weight gain associated with insulin. -Increased dose of Lantus (insulin glargine) to 37 units daily.  -Increased dose of Humalog (insulin lispro) to 18 units daily.  -Started Ozempic (generic semaglutide) 0.25mg  (minus 4 clicks) weekly. Instructed patient to increase to 0.25mg  dose if able to tolerate GI symptoms.  - for neuropathy, instructed patient to talk to her oncologist about increasing pregabalin to TID. -Patient educated on purpose, proper use, and potential adverse effects of Ozempic.  -Extensively discussed pathophysiology of diabetes, recommended lifestyle interventions, dietary effects on blood sugar control.  -Counseled on s/sx of and management of hypoglycemia.   Patient brought in two pens that were "dysfunctional" - After contacting company it appears that lack of repriming and reuse of needles is likely causing a pressure issue in the pens and by repriming the pen needles this relieves the pressure and the pens were functional again.   Contacted patient by phone and determined that two things likely contributed to dysfunctional pens.  Pen storage may be colder (freezing) than ideal due to "old refrigerator on its last leg" and the patient has been using only 1 pen needle for the entire use of the pen.  I asked her to prime pen more often (at least every few days) to minimize this issue.

## 2022-04-15 NOTE — Patient Instructions (Addendum)
It was nice to see you today!  Your goal blood sugar is 80-130 before eating and less than 180 after eating.  Medication Changes: - Increase your Lantus to 37 units every morning - Increase your Humalog to 18 units three times daily with meals - Start taking Ozempic 0.25mg  minus 4 clicks weekly. After 2 weeks, if you are not feeling any stomach upset, increase to the full 0.25mg  dose.   Aim for a diet full of vegetables, fruit and lean meats (chicken, [place], fish). Try to limit salt intake by eating fresh or frozen vegetables (instead of canned), rinse canned vegetables prior to cooking and do not add any additional salt to meals.

## 2022-04-15 NOTE — Progress Notes (Addendum)
S:     Chief Complaint  Patient presents with   Medication Management    Diabetes   [name] is a 71 y.o. female who presents for diabetes evaluation, education, and management.  PMH is significant for T2DM and metastatic stage 4 breast cancer.  Patient was referred and last seen by Primary Care Provider, Dr. Erin Hearing, on 04/08/2022.  At last visit, blood sugars ranged from 120-240 on Lantus 35 units daily and humalog 16 units TID with meals. She has a history of GI intolerance with Ozempic, but was open to trying it again.   Today, patient arrives in good spirits and presents in a wheelchair, wearing oxygen and has portable oxygen concentrator with her. She feels that her blood sugar control is worse the week of chemo due to brain fog. Patient reports feeling weak and shaky due to her blood sugar at times but has difficulty differentiating between cancer, COPD and low blood sugar symptoms. No lows on CGM. She notes neuropathic pain today at a 7/10, she describes this as her baseline pain level. She expresses concern about infection with in person visits during flu season.   Family/Social History: Daughter cares for her at home, Is not able to cook for herself due to physical limitations  Current diabetes medications include: Lantus (Insulin glargine) 35 units QAM, Humalog (insulin lispro) 16 units TID with meals  Patient reports adherence to taking all medications as prescribed. She does not like taking excessive pills   Have you been experiencing any side effects to the medications prescribed? Yes, weight gain from insulin.  Insurance coverage: [employer]  Patient reported dietary habits: Eats 2-3 meals/day, lots of variation in her day to day diet. Eats meals based on her blood sugar levels - only eats if blood sugar is < 200 Breakfast: often skips Lunch: often skips Dinner: usually around 9PM, cooked meal from daughter, sometimes TV dinner Drinks: [employer] for  electrolytes, tomato juice  Patient-reported exercise habits: Wheelchair bound and chemotherapy cause difficulty getting physical activity. Unable to walk after meals  O:   Review of Systems  Neurological:  Positive for tingling.    Physical Exam Neurological:     Mental Status: She is alert.  Psychiatric:        Mood and Affect: Mood normal.        Behavior: Behavior normal.    8/29 CGM Download:  % Time CGM is active: 82% Average Glucose: 194 mg/dL Glucose Management Indicator: 8.0%  Glucose Variability: 25.5 (goal <36%) Time in Goal:  - Time in range 70-180: 48% - Time above range: 52% - Time below range: 0% Observed patterns: Blood sugar is most often high overnight and after dinner.   Lab Results  Component Value Date   HGBA1C 6.3 04/08/2022    Vitals:   04/15/22 1101 04/15/22 1103  BP: (!) 138/120 (!) 169/76  Pulse: 60   SpO2: 98%     Lipid Panel     Component Value Date/Time   CHOL 150 11/08/2019 1344   TRIG 107 04/30/2020 1626   HDL 66 11/08/2019 1344   CHOLHDL 2.3 11/08/2019 1344   CHOLHDL 2.5 10/27/2012 1653   VLDL 30 10/27/2012 1653   LDLCALC 60 11/08/2019 1344    Clinical Atherosclerotic Cardiovascular Disease (ASCVD): No  The 10-year ASCVD risk score (Arnett DK, et al., 2019) is: 31.8%   Values used to calculate the score:     Age: 23 years     Sex:  Female     Is Non-Hispanic African American: No     Diabetic: Yes     Tobacco smoker: No     Systolic Blood Pressure: 381 mmHg     Is BP treated: No     HDL Cholesterol: 66 mg/dL     Total Cholesterol: 150 mg/dL    A/P: Diabetes longstanding currently somewhat controlled. Patient is able to verbalize appropriate hypoglycemia management plan. Medication adherence appears good. Control is suboptimal due to weight gain associated with insulin. -Increased dose of Lantus (insulin glargine) to 37 units daily.  -Increased dose of Humalog (insulin lispro) to 18 units daily.  -Started Ozempic  (generic semaglutide) 0.25mg  (minus 4 clicks) weekly. Instructed patient to increase to 0.25mg  dose if able to tolerate GI symptoms.  - for neuropathy, instructed patient to talk to her oncologist about increasing pregabalin to TID. -Patient educated on purpose, proper use, and potential adverse effects of Ozempic.  -Extensively discussed pathophysiology of diabetes, recommended lifestyle interventions, dietary effects on blood sugar control.  -Counseled on s/sx of and management of hypoglycemia.   Patient brought in two pens that were "dysfunctional" - After contacting company it appears that lack of repriming and reuse of needles is likely causing a pressure issue in the pens and by repriming the pen needles this relieves the pressure and the pens were functional again.   Contacted patient by phone and determined that two things likely contributed to dysfunctional pens.  Pen storage may be colder (freezing) than ideal due to "old refrigerator on its last leg" and the patient has been using only 1 pen needle for the entire use of the pen.  I asked her to prime pen more often (at least every few days) to minimize this issue.    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  - currently taking pregablin 50mg  BID and oxycodone.  - encouraged patient to request titration to higher dose pregabalin from 50mg  BID to TID OR increase to 75mg  BID/TID  at next opportunity as this medication is currently at the lowest dose.    Written patient instructions provided. Patient verbalized understanding of treatment plan.  Total time in face to face counseling 48 minutes.    Follow-up:  Pharmacist virtual visit in 1 month. Patient seen with Titus Dubin, PharmD PGY-1 Resident.

## 2022-04-22 ENCOUNTER — Other Ambulatory Visit: Payer: Self-pay | Admitting: Family Medicine

## 2022-04-23 ENCOUNTER — Telehealth: Payer: Self-pay

## 2022-04-23 NOTE — Telephone Encounter (Signed)
Patient calls nurse line with multiple concerns.  She is out of test strips. She is requesting refill on One Touch Ultra strips. This is pended in separate encounter from pharmacy.  Patient is concerned that she received the wrong insulin. Patient states that she takes Humalog and received prescription for insulin lispro. Explained that these are the same medication and provided with dosage instructions per visit notes with Dr. Valentina Lucks.  Patient also expressed concerns for how to administer Ozempic. She states that she dialed pen to 0.5 mg and clicked back four times. Spoke with Dr. Valentina Lucks. Patient should dial pen to 0.25 mg and administer next dose next week. Explained to patient. Verbalizes understanding.  Patient is scheduled for follow up with Dr. Valentina Lucks on 9/28. Patient will return call to office if she has any concerns between now and appointment.   Talbot Grumbling, RN

## 2022-04-26 DIAGNOSIS — E1149 Type 2 diabetes mellitus with other diabetic neurological complication: Secondary | ICD-10-CM | POA: Diagnosis not present

## 2022-05-03 ENCOUNTER — Other Ambulatory Visit: Payer: Self-pay | Admitting: Hematology and Oncology

## 2022-05-03 ENCOUNTER — Other Ambulatory Visit: Payer: Self-pay | Admitting: Family Medicine

## 2022-05-03 DIAGNOSIS — E1149 Type 2 diabetes mellitus with other diabetic neurological complication: Secondary | ICD-10-CM

## 2022-05-04 ENCOUNTER — Encounter: Payer: Self-pay | Admitting: Hematology and Oncology

## 2022-05-05 ENCOUNTER — Other Ambulatory Visit: Payer: Self-pay

## 2022-05-05 ENCOUNTER — Other Ambulatory Visit: Payer: Self-pay | Admitting: Family Medicine

## 2022-05-05 DIAGNOSIS — E1149 Type 2 diabetes mellitus with other diabetic neurological complication: Secondary | ICD-10-CM

## 2022-05-05 MED ORDER — LANTUS SOLOSTAR 100 UNIT/ML ~~LOC~~ SOPN: 37.0000 [IU] | PEN_INJECTOR | Freq: Every day | SUBCUTANEOUS | 3 refills | Status: DC

## 2022-05-05 MED ORDER — OXYCODONE HCL 5 MG PO TABS: 5.0000 mg | ORAL_TABLET | Freq: Four times a day (QID) | ORAL | 0 refills | Status: DC | PRN

## 2022-05-05 NOTE — Telephone Encounter (Signed)
Pt called for a refill of oxycodone, see new orders.

## 2022-05-08 ENCOUNTER — Inpatient Hospital Stay: Payer: Medicare Other

## 2022-05-08 ENCOUNTER — Other Ambulatory Visit: Payer: Self-pay

## 2022-05-08 ENCOUNTER — Other Ambulatory Visit: Payer: Self-pay | Admitting: Hematology and Oncology

## 2022-05-08 ENCOUNTER — Inpatient Hospital Stay: Payer: Medicare Other | Admitting: Nurse Practitioner

## 2022-05-08 ENCOUNTER — Inpatient Hospital Stay: Payer: Medicare Other | Attending: Oncology

## 2022-05-08 VITALS — BP 155/51 | HR 63 | Temp 97.8°F | Resp 18 | Wt 317.0 lb

## 2022-05-08 DIAGNOSIS — C50919 Malignant neoplasm of unspecified site of unspecified female breast: Secondary | ICD-10-CM | POA: Diagnosis not present

## 2022-05-08 DIAGNOSIS — Z87891 Personal history of nicotine dependence: Secondary | ICD-10-CM | POA: Insufficient documentation

## 2022-05-08 DIAGNOSIS — Z95828 Presence of other vascular implants and grafts: Secondary | ICD-10-CM

## 2022-05-08 DIAGNOSIS — Z170 Estrogen receptor positive status [ER+]: Secondary | ICD-10-CM

## 2022-05-08 DIAGNOSIS — Z5112 Encounter for antineoplastic immunotherapy: Secondary | ICD-10-CM | POA: Insufficient documentation

## 2022-05-08 DIAGNOSIS — K5900 Constipation, unspecified: Secondary | ICD-10-CM | POA: Insufficient documentation

## 2022-05-08 DIAGNOSIS — R918 Other nonspecific abnormal finding of lung field: Secondary | ICD-10-CM | POA: Insufficient documentation

## 2022-05-08 DIAGNOSIS — F419 Anxiety disorder, unspecified: Secondary | ICD-10-CM | POA: Diagnosis not present

## 2022-05-08 LAB — CBC WITH DIFFERENTIAL (CANCER CENTER ONLY)
Abs Immature Granulocytes: 0.02 10*3/uL (ref 0.00–0.07)
Basophils Absolute: 0 10*3/uL (ref 0.0–0.1)
Basophils Relative: 0 %
Eosinophils Absolute: 0.1 10*3/uL (ref 0.0–0.5)
Eosinophils Relative: 2 %
HCT: 39 % (ref 36.0–46.0)
Hemoglobin: 12.9 g/dL (ref 12.0–15.0)
Immature Granulocytes: 0 %
Lymphocytes Relative: 31 %
Lymphs Abs: 2.1 10*3/uL (ref 0.7–4.0)
MCH: 29.7 pg (ref 26.0–34.0)
MCHC: 33.1 g/dL (ref 30.0–36.0)
MCV: 89.9 fL (ref 80.0–100.0)
Monocytes Absolute: 0.5 10*3/uL (ref 0.1–1.0)
Monocytes Relative: 7 %
Neutro Abs: 4 10*3/uL (ref 1.7–7.7)
Neutrophils Relative %: 60 %
Platelet Count: 208 10*3/uL (ref 150–400)
RBC: 4.34 MIL/uL (ref 3.87–5.11)
RDW: 13.6 % (ref 11.5–15.5)
WBC Count: 6.7 10*3/uL (ref 4.0–10.5)
nRBC: 0 % (ref 0.0–0.2)

## 2022-05-08 LAB — CMP (CANCER CENTER ONLY)
ALT: 52 U/L — ABNORMAL HIGH (ref 0–44)
AST: 39 U/L (ref 15–41)
Albumin: 3.5 g/dL (ref 3.5–5.0)
Alkaline Phosphatase: 81 U/L (ref 38–126)
Anion gap: 4 — ABNORMAL LOW (ref 5–15)
BUN: 18 mg/dL (ref 8–23)
CO2: 33 mmol/L — ABNORMAL HIGH (ref 22–32)
Calcium: 9.1 mg/dL (ref 8.9–10.3)
Chloride: 102 mmol/L (ref 98–111)
Creatinine: 0.7 mg/dL (ref 0.44–1.00)
GFR, Estimated: >60 mL/min (ref >60)
Glucose, Bld: 222 mg/dL — ABNORMAL HIGH (ref 70–99)
Potassium: 4.2 mmol/L (ref 3.5–5.1)
Sodium: 139 mmol/L (ref 135–145)
Total Bilirubin: 0.4 mg/dL (ref 0.3–1.2)
Total Protein: 6.9 g/dL (ref 6.5–8.1)

## 2022-05-08 MED ORDER — SODIUM CHLORIDE 0.9% FLUSH
10.0000 mL | Freq: Once | INTRAVENOUS | Status: AC
  Administered 2022-05-08: 10 mL

## 2022-05-08 MED ORDER — ACETAMINOPHEN 325 MG PO TABS
650.0000 mg | ORAL_TABLET | Freq: Once | ORAL | Status: AC
  Administered 2022-05-08: 650 mg via ORAL
  Filled 2022-05-08: qty 2

## 2022-05-08 MED ORDER — DIPHENHYDRAMINE HCL 25 MG PO CAPS
25.0000 mg | ORAL_CAPSULE | Freq: Once | ORAL | Status: AC
  Administered 2022-05-08: 25 mg via ORAL
  Filled 2022-05-08: qty 1

## 2022-05-08 MED ORDER — SODIUM CHLORIDE 0.9 % IV SOLN: Freq: Once | INTRAVENOUS | Status: AC

## 2022-05-08 MED ORDER — TRASTUZUMAB-ANNS CHEMO 150 MG IV SOLR
750.0000 mg | Freq: Once | INTRAVENOUS | Status: AC
  Administered 2022-05-08: 750 mg via INTRAVENOUS
  Filled 2022-05-08: qty 35.72

## 2022-05-08 MED ORDER — HEPARIN SOD (PORK) LOCK FLUSH 100 UNIT/ML IV SOLN
500.0000 [IU] | Freq: Once | INTRAVENOUS | Status: AC | PRN
  Administered 2022-05-08: 500 [IU]

## 2022-05-08 MED ORDER — LORAZEPAM 2 MG/ML IJ SOLN
0.5000 mg | Freq: Once | INTRAMUSCULAR | Status: AC
  Administered 2022-05-08: 0.5 mg via INTRAVENOUS
  Filled 2022-05-08: qty 1

## 2022-05-08 MED ORDER — SODIUM CHLORIDE 0.9% FLUSH
10.0000 mL | Freq: Once | INTRAVENOUS | Status: AC
  Administered 2022-05-08: 10 mL via INTRAVENOUS

## 2022-05-08 NOTE — Patient Instructions (Signed)
[facility]   Discharge Instructions: Thank you for choosing [facility] to provide your oncology and hematology care.   If you have a lab appointment with the [facility], please go directly to the [facility] and check in at the registration area.   Wear comfortable clothing and clothing appropriate for easy access to any Portacath or PICC line.   We strive to give you quality time with your provider. You may need to reschedule your appointment if you arrive late (15 or more minutes).  Arriving late affects you and other patients whose appointments are after yours.  Also, if you miss three or more appointments without notifying the office, you may be dismissed from the clinic at the provider's discretion.      For prescription refill requests, have your pharmacy contact our office and allow 72 hours for refills to be completed.    Today you received the following chemotherapy and/or immunotherapy agents: Trastuzumab (Herceptin)       To help prevent nausea and vomiting after your treatment, we encourage you to take your nausea medication as directed.  BELOW ARE SYMPTOMS THAT SHOULD BE REPORTED IMMEDIATELY: *FEVER GREATER THAN 100.4 F (38 C) OR HIGHER *CHILLS OR SWEATING *NAUSEA AND VOMITING THAT IS NOT CONTROLLED WITH YOUR NAUSEA MEDICATION *UNUSUAL SHORTNESS OF BREATH *UNUSUAL BRUISING OR BLEEDING *URINARY PROBLEMS (pain or burning when urinating, or frequent urination) *BOWEL PROBLEMS (unusual diarrhea, constipation, pain near the anus) TENDERNESS IN MOUTH AND THROAT WITH OR WITHOUT PRESENCE OF ULCERS (sore throat, sores in mouth, or a toothache) UNUSUAL RASH, SWELLING OR PAIN  UNUSUAL VAGINAL DISCHARGE OR ITCHING   Items with * indicate a potential emergency and should be followed up as soon as possible or go to the Emergency Department if any problems should occur.  Please show the CHEMOTHERAPY ALERT CARD or IMMUNOTHERAPY ALERT  CARD at check-in to the Emergency Department and triage nurse.  Should you have questions after your visit or need to cancel or reschedule your appointment, please contact [facility]  Dept: [phone]  and follow the prompts.  Office hours are 8:00 a.m. to 4:30 p.m. Monday - Friday. Please note that voicemails left after 4:00 p.m. may not be returned until the following business day.  We are closed weekends and major holidays. You have access to a nurse at all times for urgent questions. Please call the main number to the clinic Dept: [phone] and follow the prompts.   For any non-urgent questions, you may also contact your provider using MyChart. We now offer e-Visits for anyone 74 and older to request care online for non-urgent symptoms. For details visit [email or web address].   Also download the MyChart app! Go to the app store, search "MyChart", open the app, select [facility], and log in with your MyChart username and password.  Masks are optional in the cancer centers. If you would like for your care team to wear a mask while they are taking care of you, please let them know. For doctor visits, patients may have with them one support person who is at least 72 years old. At this time, visitors are not allowed in the infusion area.

## 2022-05-09 ENCOUNTER — Inpatient Hospital Stay (HOSPITAL_BASED_OUTPATIENT_CLINIC_OR_DEPARTMENT_OTHER): Payer: Medicare Other | Admitting: Nurse Practitioner

## 2022-05-09 DIAGNOSIS — K5900 Constipation, unspecified: Secondary | ICD-10-CM

## 2022-05-09 DIAGNOSIS — R530 Neoplastic (malignant) related fatigue: Secondary | ICD-10-CM | POA: Diagnosis not present

## 2022-05-09 DIAGNOSIS — C7800 Secondary malignant neoplasm of unspecified lung: Secondary | ICD-10-CM | POA: Diagnosis not present

## 2022-05-09 DIAGNOSIS — C50919 Malignant neoplasm of unspecified site of unspecified female breast: Secondary | ICD-10-CM | POA: Diagnosis not present

## 2022-05-09 DIAGNOSIS — G893 Neoplasm related pain (acute) (chronic): Secondary | ICD-10-CM | POA: Diagnosis not present

## 2022-05-09 DIAGNOSIS — Z515 Encounter for palliative care: Secondary | ICD-10-CM

## 2022-05-09 MED ORDER — DULOXETINE HCL 30 MG PO CPEP: 30.0000 mg | ORAL_CAPSULE | Freq: Every day | ORAL | 1 refills | Status: DC

## 2022-05-09 MED ORDER — PREGABALIN 50 MG PO CAPS: 50.0000 mg | ORAL_CAPSULE | Freq: Three times a day (TID) | ORAL | 1 refills | Status: DC

## 2022-05-09 NOTE — Progress Notes (Signed)
[facility]  [phone] [phone]   Name: [name] Date: 05/09/2022 MRN: [id number]  DOB: Feb 07, 1951  Patient Care Team: [name], MD as PCP - General (Family Medicine) Larey Dresser, MD as Consulting Physician (Cardiology) Benay Pike, MD as Consulting Physician (Hematology and Oncology)   I connected with [name] on 05/09/22 at 11:00 AM EDT by Phone and verified that I am speaking with the correct person using two identifiers.   I discussed the limitations, risks, security and privacy concerns of performing an evaluation and management service by telemedicine and the availability of in-person appointments. I also discussed with the patient that there may be a patient responsible charge related to this service. The patient expressed understanding and agreed to proceed.   Other persons participating in the visit and their role in the encounter: [place], RN    Patient's location: Home   Provider's location: [place]    INTERVAL HISTORY: [name] is a 71 y.o. female with  medical history including metastatic breast cancer with numerous pulmonary nodules bilaterally s/p chemotherapy currently on herceptin for maintenance. Now with complaints of uncontrolled back pain.  Palliative ask to see for symptom management.   SOCIAL HISTORY:     reports that she quit smoking about 53 years ago. Her smoking use included cigarettes. She has a 15.00 pack-year smoking history. She has never used smokeless tobacco. She reports that she does not drink alcohol and does not use drugs.  ADVANCE DIRECTIVES:    CODE STATUS:   PAST MEDICAL HISTORY: Past Medical History:  Diagnosis Date   Arthritis    Back pain    Breast cancer ([place]) dx'd 11/2012   left   Chest pain    COPD (chronic obstructive pulmonary disease) ([place])    Diabetes mellitus without complication ([place]) 5/32/9924   Ear pain    Fatty  liver 6/03   Hypertension    Lung disease    Lung metastases dx'd 11/2012   Obesity, unspecified    Other abnormal glucose    Suicide attempt ([place]) 1996   Syncope and collapse    Unspecified sleep apnea     ALLERGIES:  is allergic to meperidine hcl, penicillins, and aspirin.  MEDICATIONS:  Current Outpatient Medications  Medication Sig Dispense Refill   albuterol (VENTOLIN HFA) 108 (90 Base) MCG/ACT inhaler INHALE 2 PUFFS INTO THE LUNGS EVERY 6 HOURS AS NEEDED FOR WHEEZE 8.5 each 1   Blood Glucose Monitoring Suppl (ONE TOUCH ULTRA 2) w/Device KIT 1 kit by Does not apply route QID. ICD 10-code: E11.49. 1 kit 0   diazepam (VALIUM) 5 MG tablet Take 1 tablet (5 mg total) by mouth every 12 (twelve) hours as needed for anxiety. 30 tablet 0   DULoxetine (CYMBALTA) 30 MG capsule TAKE 1 CAPSULE BY MOUTH EVERY DAY (Patient not taking: Reported on 04/08/2022) 30 capsule 0   fluconazole (DIFLUCAN) 100 MG tablet Take 1 tablet (100 mg total) by mouth daily. 10 tablet 1   glucose blood (ONETOUCH ULTRA) test strip CHECK BLOOD SUGAR ONCE A DAY 100 strip 2   insulin glargine (LANTUS SOLOSTAR) 100 UNIT/ML Solostar Pen Inject 37 Units into the skin daily. 15 mL 3   insulin lispro (HUMALOG KWIKPEN) 100 UNIT/ML KwikPen Inject 18 Units into the skin 3 (three) times daily before meals. 3 mL 1   Insulin Pen Needle (PEN NEEDLES) 30G X 8 MM MISC Please use to inject  insulin 4 times daily. 200 each 3   ketoconazole (NIZORAL) 2 % cream Apply 1 application. topically daily. 15 g 0   letrozole (FEMARA) 2.5 MG tablet Take 1 tablet (2.5 mg total) by mouth daily. 90 tablet 12   loperamide (IMODIUM) 2 MG capsule TAKE 1 CAPSULE (2 MG TOTAL) BY MOUTH AS NEEDED FOR DIARRHEA OR LOOSE STOOLS. 30 capsule 1   ondansetron (ZOFRAN) 8 MG tablet Take 1 tablet (8 mg total) by mouth every 8 (eight) hours as needed for nausea or vomiting. 20 tablet 0   oxyCODONE (OXY IR/ROXICODONE) 5 MG immediate release tablet Take 1 tablet (5 mg total)  by mouth every 6 (six) hours as needed for severe pain. 90 tablet 0   OXYGEN Inhale into the lungs.     pregabalin (LYRICA) 50 MG capsule Take 1 capsule (50 mg total) by mouth 2 (two) times daily. 60 capsule 1   Semaglutide,0.25 or 0.5MG /DOS, 2 MG/1.5ML SOPN Inject 0.25 mg into the skin once a week. 0.25 mg once weekly for 4 weeks then increase to 0.5 mg weekly for at least 4 weeks,max 1 mg 3 mL 3   No current facility-administered medications for this visit.    VITAL SIGNS: There were no vitals taken for this visit. There were no vitals filed for this visit.  Estimated body mass index is 54.41 kg/m as calculated from the following:   Height as of 04/10/22: 5\' 4"  (1.626 m).   Weight as of 05/08/22: 317 lb (143.8 kg).   PERFORMANCE STATUS (ECOG) : 1 - Symptomatic but completely ambulatory    IMPRESSION:  I connected with Ms. Swindle by phone for symptom management follow-up. No acute distress identified.   Neoplasm/Chronic/Neuropathic pain Ms. Stangelo states her pain is ongoing. She unfortunately did not feel as though she was tolerating Oxycodone ER and has discontinued use. She was experiencing nausea and vomiting although she also related this to her recent start on ozempic.  She has zofran on hand which is helpful with her symptoms. She is not interested in restartng the oxycodone ER.   We discussed her use of Oxy IR as needed for pain. She is tolerating well. Feels some improvement in neuropathic pain. Her PCP has recommended to increase to three times daily. We discussed restarting Cymbalta which could also offer some improvement in her pain. She verbalized understanding.  We will continue to closely monitor and adjust medications accordingly.   Constipation Reports regular bowel movements. Ongoing education provided on the use of miralax daily to prevent constipation.   Anxiety  We discussed Her current illness and what it means in the larger context of Her on-going  co-morbidities. Natural disease trajectory and expectations were discussed.  7/27: She becomes tearful expressing her disease progression over time. She reports her children are aware of her condition to an extent however she chooses not to "tell them" everything until she knows she is facing end-of-life. I created space and opportunity to allow her to express her thoughts and feelings. Lealer states she is having some anxiety to the point she feels as though it is hard to breath and some nervousness. This occurs as she thinks often about her cancer, not if but when will she have to face end-of-life, and how much longer will she make it. Emotional support provided.   Education provided on use of benzodiazepines short-term vs long-term. I discussed at length use of valium in addition to other non-pharmacological therapeutic methods. She verbalized understanding and appreciation.  We will continue to closely monitor and support.    I discussed the importance of continued conversation with family and their medical providers regarding overall plan of care and treatment options, ensuring decisions are within the context of the patients values and GOCs.  PLAN: Oxy IR as needed for breakthrough pain Declines to restart or try different medication focused on long-acting pain relief due to potential symptoms.  Valium twice daily as needed for anxiety  Pregabalin $RemoveBef'50mg'FBVUVSVUUM$  three times daily  Miralax daily for bowel regimen Ongoing goals of care discussions and support I will plan to see her back in the office in 3-4 weeks in collaboration with her other oncology appointments. Sooner if needed.    Patient expressed understanding and was in agreement with this plan. She also understands that She can call the clinic at any time with any questions, concerns, or complaints.       Any controlled substances utilized were prescribed in the context of palliative care. PDMP has been reviewed.    Time Total: 45 min    Visit consisted of counseling and education dealing with the complex and emotionally intense issues of symptom management and palliative care in the setting of serious and potentially life-threatening illness.Greater than 50%  of this time was spent counseling and coordinating care related to the above assessment and plan.  Alda Lea, AGPCNP-BC  Palliative Medicine Team/[facility]

## 2022-05-10 DIAGNOSIS — U071 COVID-19: Secondary | ICD-10-CM | POA: Diagnosis not present

## 2022-05-10 DIAGNOSIS — I5020 Unspecified systolic (congestive) heart failure: Secondary | ICD-10-CM | POA: Diagnosis not present

## 2022-05-10 DIAGNOSIS — M25569 Pain in unspecified knee: Secondary | ICD-10-CM | POA: Diagnosis not present

## 2022-05-10 DIAGNOSIS — R062 Wheezing: Secondary | ICD-10-CM | POA: Diagnosis not present

## 2022-05-11 DIAGNOSIS — I5020 Unspecified systolic (congestive) heart failure: Secondary | ICD-10-CM | POA: Diagnosis not present

## 2022-05-11 DIAGNOSIS — R062 Wheezing: Secondary | ICD-10-CM | POA: Diagnosis not present

## 2022-05-11 DIAGNOSIS — M25569 Pain in unspecified knee: Secondary | ICD-10-CM | POA: Diagnosis not present

## 2022-05-11 DIAGNOSIS — U071 COVID-19: Secondary | ICD-10-CM | POA: Diagnosis not present

## 2022-05-15 ENCOUNTER — Telehealth (INDEPENDENT_AMBULATORY_CARE_PROVIDER_SITE_OTHER): Payer: Medicare Other | Admitting: Pharmacist

## 2022-05-15 DIAGNOSIS — E1149 Type 2 diabetes mellitus with other diabetic neurological complication: Secondary | ICD-10-CM | POA: Diagnosis not present

## 2022-05-15 MED ORDER — INSULIN LISPRO (1 UNIT DIAL) 100 UNIT/ML (KWIKPEN): 16.0000 [IU] | PEN_INJECTOR | Freq: Three times a day (TID) | SUBCUTANEOUS | Status: DC

## 2022-05-15 MED ORDER — LANTUS SOLOSTAR 100 UNIT/ML ~~LOC~~ SOPN: 35.0000 [IU] | PEN_INJECTOR | Freq: Every day | SUBCUTANEOUS | Status: DC

## 2022-05-15 NOTE — Progress Notes (Signed)
    S:     Chief Complaint  Patient presents with   Medication Management    CGM - Insulin / GLP adjustment   [name] is a 71 y.o. female who arrives for VIDEO VISIT for diabetes evaluation, education, and management. She is wearing Oxygen nasal canula during visit.  - Patient was at home -Visit conducted from [facility]  PMH is significant for ongoing treatment for breast with metastatic lung cancer. Patient was referred and last seen by Primary Care Provider, Dr. Erin Hearing, on 05/05/2022.   At last visit, GLP - Ozempic (semaglutide) was initiated and insulin was adjusted. .   Today, patient is in good spirits and reports her blood sugars are doing very well.  She has uploaded her Tony CGM for [place] to review.      Current diabetes medications include: Lantus 37 units once daily Humalog 18 units TID AC and Ozempic self titrated to 0.5mg  weekly and tolerating well. Patient reports adherence to taking all medications as prescribed.   Do you feel that your medications are working for you? yes Have you been experiencing any side effects to the medications prescribed? no  Patient denies hypoglycemic events.  States she knows to eat something when her sugar reaches. 90mg /dl.   No issues with insulin PENS since last visit Using more frequent pen needle tip exchanges has eliminated issues with malfunction of pens.    O:   [employer] and Reviewed [employer] sensor - active 87% of the last two weeks 14-day BG average 155, within target range 79% of the time Above 180 mg/dL 21% of the time Below 70 mg/dL 0% of the time Variability 25.2%  Patient reported dietary habits: Eats 3 meals/day  Review of Systems  All other systems reviewed and are negative.   Lab Results  Component Value Date   HGBA1C 6.3 04/08/2022    A/P: Diabetes longstanding Diabetes currently with excellent blood sugar control and tolerating recently  initiated Ozempic (semaglutide).  Patient is able to verbalize appropriate hypoglycemia management plan. Medication adherence appears good. -Decreased dose of basal insulin insulin glargine (Lantus)  from 37 to 35 units.  -Decreased dose of rapid insulin Humalog (insulin lispro) from 18 to 16 units.  -Continued GLP-1 Ozempic (semaglutide) at 0.5mg  once weekly.  Patient was satisfied with her care and thrilled with her current diabetes control and plan.   Reviewed patient instructions with teach-back, patient verbalized understanding of treatment plan.  Total time in face to face counseling 19 minutes.    Follow-up:  Pharmacist in 3-4 weeks with another video visit.  Consider dose escalation of Ozempic at that time.  PCP clinic visit not scheduled at this time.  Patient seen with Martina Sinner, PharmD Candidate, Jeneen Rinks,  PharmD PGY-1 Resident, and Joseph Art, PharmD, PGY2 Pharmacy Resident.  Marland Kitchen

## 2022-05-15 NOTE — Patient Instructions (Signed)
Decrease Lantus to 35 units daily  Decrease HUmalog to 16 units three times daily prior to meals  Continue Ozempic 0.5mg  once weekly.    Next visit via video 10/19 at 10:30 AM

## 2022-05-15 NOTE — Progress Notes (Signed)
Reviewed: agree with Dr. Graylin Shiver documentation and management.

## 2022-05-15 NOTE — Assessment & Plan Note (Signed)
Diabetes longstanding Diabetes currently with excellent blood sugar control and tolerating recently initiated Ozempic (semaglutide).  Patient is able to verbalize appropriate hypoglycemia management plan. Medication adherence appears good. -Decreased dose of basal insulin insulin glargine (Lantus)  from 37 to 35 units.  -Decreased dose of rapid insulin Humalog (insulin lispro) from 18 to 16 units.  -Continued GLP-1 Ozempic (semaglutide) at 0.5mg  once weekly.

## 2022-05-19 ENCOUNTER — Other Ambulatory Visit: Payer: Self-pay

## 2022-05-19 MED ORDER — DIAZEPAM 5 MG PO TABS: 5.0000 mg | ORAL_TABLET | Freq: Two times a day (BID) | ORAL | 0 refills | Status: DC | PRN

## 2022-05-19 NOTE — Telephone Encounter (Signed)
Pt called regarding a refill on valium, see new orders.

## 2022-05-26 ENCOUNTER — Telehealth: Payer: Self-pay | Admitting: *Deleted

## 2022-05-26 ENCOUNTER — Telehealth: Payer: Self-pay

## 2022-05-26 DIAGNOSIS — E1149 Type 2 diabetes mellitus with other diabetic neurological complication: Secondary | ICD-10-CM | POA: Diagnosis not present

## 2022-05-26 NOTE — Telephone Encounter (Signed)
Patient calls nurse line requesting Doxycycline for UTI symptoms.  Patient reports symptoms started on Friday. She reports urinary frequency with little out put. Patient denies hematuria, dysuria, flank pain, fevers or chills at this time.   Patient reports she has been drinking plenty of fluids and taking cranberry supplements with minimal relief.    Patient advised she would more than likely need to make an apt. Patient stated, "I have no way to get to the office." She reports no transportation.   Will forward to PCP for advisement.   Red flags discussed with patient.

## 2022-05-26 NOTE — Telephone Encounter (Signed)
This RN spoke with pt per her call stating she is having recurrent UTI symptoms that she was previously given ( remotely ) doxycycline with improvement.  " I seem to want to brew these infections- between my diabetes and getting chemo "  Per review for possible need for urine sample- Alya states she is unable to provide " just can't pee sometimes " as well as functional inability to " hold the cup to pee in ".  This RN stated possible collection by use of a "hat" container in the toilet with pt stating that does not seem to help either.  This RN informed her per review with covering provider concern is that the antibiotic she is requesting may not appropriate and there fore cause infection to worsen.  This RN inquired about her primary MD who also manages her diabetes - but she states "they want a urine sample as well".  This RN informed her above will be forwarded to Dr Chryl Heck who is currently not in the office presently.  Pt is scheduled for her next lab chemo and visit on 10/19.

## 2022-05-27 NOTE — Telephone Encounter (Signed)
Patient returns call to nurse line regarding request for abx. Patient states that she is not able to come into our office due to lack of transportation. She states that oncologist used to prescribe this for her regularly, however, he retired and new provider is requesting that she reach out to PCP for refill.   Talbot Grumbling, RN

## 2022-05-28 MED ORDER — NITROFURANTOIN MONOHYD MACRO 100 MG PO CAPS: 100.0000 mg | ORAL_CAPSULE | Freq: Two times a day (BID) | ORAL | 0 refills | Status: DC

## 2022-05-28 NOTE — Telephone Encounter (Signed)
Burning and frequency.   No fever or nausea and vomiting  Sent in macrobid twice a day for 5 days  If not better or any worening needs to be seen

## 2022-06-01 ENCOUNTER — Other Ambulatory Visit: Payer: Self-pay | Admitting: Nurse Practitioner

## 2022-06-01 DIAGNOSIS — Z515 Encounter for palliative care: Secondary | ICD-10-CM

## 2022-06-01 DIAGNOSIS — C7800 Secondary malignant neoplasm of unspecified lung: Secondary | ICD-10-CM

## 2022-06-01 DIAGNOSIS — G893 Neoplasm related pain (acute) (chronic): Secondary | ICD-10-CM

## 2022-06-05 ENCOUNTER — Encounter: Payer: Self-pay | Admitting: Hematology and Oncology

## 2022-06-05 ENCOUNTER — Inpatient Hospital Stay: Payer: Medicare Other

## 2022-06-05 ENCOUNTER — Inpatient Hospital Stay: Payer: Medicare Other | Attending: Oncology

## 2022-06-05 ENCOUNTER — Other Ambulatory Visit: Payer: Self-pay | Admitting: *Deleted

## 2022-06-05 ENCOUNTER — Telehealth (INDEPENDENT_AMBULATORY_CARE_PROVIDER_SITE_OTHER): Payer: Medicare Other | Admitting: Pharmacist

## 2022-06-05 ENCOUNTER — Inpatient Hospital Stay (HOSPITAL_BASED_OUTPATIENT_CLINIC_OR_DEPARTMENT_OTHER): Payer: Medicare Other | Admitting: Hematology and Oncology

## 2022-06-05 VITALS — BP 171/51 | HR 69 | Temp 97.0°F | Resp 18 | Wt 314.2 lb

## 2022-06-05 DIAGNOSIS — Z171 Estrogen receptor negative status [ER-]: Secondary | ICD-10-CM | POA: Insufficient documentation

## 2022-06-05 DIAGNOSIS — C50212 Malignant neoplasm of upper-inner quadrant of left female breast: Secondary | ICD-10-CM | POA: Insufficient documentation

## 2022-06-05 DIAGNOSIS — E1149 Type 2 diabetes mellitus with other diabetic neurological complication: Secondary | ICD-10-CM | POA: Diagnosis not present

## 2022-06-05 DIAGNOSIS — C7801 Secondary malignant neoplasm of right lung: Secondary | ICD-10-CM | POA: Insufficient documentation

## 2022-06-05 DIAGNOSIS — Z5112 Encounter for antineoplastic immunotherapy: Secondary | ICD-10-CM | POA: Insufficient documentation

## 2022-06-05 DIAGNOSIS — Z170 Estrogen receptor positive status [ER+]: Secondary | ICD-10-CM | POA: Diagnosis not present

## 2022-06-05 DIAGNOSIS — R110 Nausea: Secondary | ICD-10-CM

## 2022-06-05 DIAGNOSIS — Z794 Long term (current) use of insulin: Secondary | ICD-10-CM

## 2022-06-05 DIAGNOSIS — C50919 Malignant neoplasm of unspecified site of unspecified female breast: Secondary | ICD-10-CM

## 2022-06-05 LAB — CMP (CANCER CENTER ONLY)
ALT: 55 U/L — ABNORMAL HIGH (ref 0–44)
AST: 37 U/L (ref 15–41)
Albumin: 3.7 g/dL (ref 3.5–5.0)
Alkaline Phosphatase: 86 U/L (ref 38–126)
Anion gap: 8 (ref 5–15)
BUN: 14 mg/dL (ref 8–23)
CO2: 32 mmol/L (ref 22–32)
Calcium: 9.4 mg/dL (ref 8.9–10.3)
Chloride: 100 mmol/L (ref 98–111)
Creatinine: 0.75 mg/dL (ref 0.44–1.00)
GFR, Estimated: >60 mL/min (ref >60)
Glucose, Bld: 162 mg/dL — ABNORMAL HIGH (ref 70–99)
Potassium: 4.1 mmol/L (ref 3.5–5.1)
Sodium: 140 mmol/L (ref 135–145)
Total Bilirubin: 0.5 mg/dL (ref 0.3–1.2)
Total Protein: 7.3 g/dL (ref 6.5–8.1)

## 2022-06-05 LAB — CBC WITH DIFFERENTIAL (CANCER CENTER ONLY)
Abs Immature Granulocytes: 0.01 10*3/uL (ref 0.00–0.07)
Basophils Absolute: 0 10*3/uL (ref 0.0–0.1)
Basophils Relative: 0 %
Eosinophils Absolute: 0.1 10*3/uL (ref 0.0–0.5)
Eosinophils Relative: 2 %
HCT: 41 % (ref 36.0–46.0)
Hemoglobin: 13.6 g/dL (ref 12.0–15.0)
Immature Granulocytes: 0 %
Lymphocytes Relative: 37 %
Lymphs Abs: 3.2 10*3/uL (ref 0.7–4.0)
MCH: 29.2 pg (ref 26.0–34.0)
MCHC: 33.2 g/dL (ref 30.0–36.0)
MCV: 88.2 fL (ref 80.0–100.0)
Monocytes Absolute: 0.5 10*3/uL (ref 0.1–1.0)
Monocytes Relative: 6 %
Neutro Abs: 4.7 10*3/uL (ref 1.7–7.7)
Neutrophils Relative %: 55 %
Platelet Count: 247 10*3/uL (ref 150–400)
RBC: 4.65 MIL/uL (ref 3.87–5.11)
RDW: 13.5 % (ref 11.5–15.5)
WBC Count: 8.6 10*3/uL (ref 4.0–10.5)
nRBC: 0 % (ref 0.0–0.2)

## 2022-06-05 MED ORDER — ONDANSETRON HCL 4 MG PO TABS
4.0000 mg | ORAL_TABLET | Freq: Once | ORAL | Status: DC
  Filled 2022-06-05: qty 1

## 2022-06-05 MED ORDER — SEMAGLUTIDE (1 MG/DOSE) 4 MG/3ML ~~LOC~~ SOPN: 1.0000 mg | PEN_INJECTOR | SUBCUTANEOUS | 3 refills | Status: DC

## 2022-06-05 MED ORDER — DIPHENHYDRAMINE HCL 25 MG PO CAPS
25.0000 mg | ORAL_CAPSULE | Freq: Once | ORAL | Status: AC
  Administered 2022-06-05: 25 mg via ORAL
  Filled 2022-06-05: qty 1

## 2022-06-05 MED ORDER — SODIUM CHLORIDE 0.9 % IV SOLN: Freq: Once | INTRAVENOUS | Status: AC

## 2022-06-05 MED ORDER — INSULIN LISPRO (1 UNIT DIAL) 100 UNIT/ML (KWIKPEN): 14.0000 [IU] | PEN_INJECTOR | Freq: Three times a day (TID) | SUBCUTANEOUS | 3 refills | Status: DC

## 2022-06-05 MED ORDER — TRASTUZUMAB-ANNS CHEMO 150 MG IV SOLR
750.0000 mg | Freq: Once | INTRAVENOUS | Status: AC
  Administered 2022-06-05: 750 mg via INTRAVENOUS
  Filled 2022-06-05: qty 35.72

## 2022-06-05 MED ORDER — ONDANSETRON 4 MG PO TBDP
4.0000 mg | ORAL_TABLET | Freq: Once | ORAL | Status: DC
  Filled 2022-06-05: qty 1

## 2022-06-05 MED ORDER — SODIUM CHLORIDE 0.9% FLUSH
10.0000 mL | Freq: Once | INTRAVENOUS | Status: AC
  Administered 2022-06-05: 10 mL via INTRAVENOUS

## 2022-06-05 MED ORDER — ONDANSETRON 4 MG PO TBDP
4.0000 mg | ORAL_TABLET | Freq: Once | ORAL | Status: AC
  Administered 2022-06-05: 4 mg via ORAL
  Filled 2022-06-05: qty 1

## 2022-06-05 MED ORDER — LORAZEPAM 2 MG/ML IJ SOLN
0.5000 mg | Freq: Once | INTRAMUSCULAR | Status: AC
  Administered 2022-06-05: 0.5 mg via INTRAVENOUS
  Filled 2022-06-05: qty 1

## 2022-06-05 MED ORDER — LANTUS SOLOSTAR 100 UNIT/ML ~~LOC~~ SOPN: 30.0000 [IU] | PEN_INJECTOR | Freq: Every day | SUBCUTANEOUS | 3 refills | Status: DC

## 2022-06-05 MED ORDER — HEPARIN SOD (PORK) LOCK FLUSH 100 UNIT/ML IV SOLN
500.0000 [IU] | Freq: Once | INTRAVENOUS | Status: AC | PRN
  Administered 2022-06-05: 500 [IU]

## 2022-06-05 MED ORDER — ACETAMINOPHEN 325 MG PO TABS
650.0000 mg | ORAL_TABLET | Freq: Once | ORAL | Status: AC
  Administered 2022-06-05: 650 mg via ORAL
  Filled 2022-06-05: qty 2
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
## 2022-06-05 NOTE — Patient Instructions (Signed)
[facility]   Discharge Instructions: Thank you for choosing [facility] to provide your oncology and hematology care.   If you have a lab appointment with the [facility], please go directly to the [facility] and check in at the registration area.   Wear comfortable clothing and clothing appropriate for easy access to any Portacath or PICC line.   We strive to give you quality time with your provider. You may need to reschedule your appointment if you arrive late (15 or more minutes).  Arriving late affects you and other patients whose appointments are after yours.  Also, if you miss three or more appointments without notifying the office, you may be dismissed from the clinic at the provider's discretion.      For prescription refill requests, have your pharmacy contact our office and allow 72 hours for refills to be completed.    Today you received the following chemotherapy and/or immunotherapy agents: Trastuzumab (Herceptin)       To help prevent nausea and vomiting after your treatment, we encourage you to take your nausea medication as directed.  BELOW ARE SYMPTOMS THAT SHOULD BE REPORTED IMMEDIATELY: *FEVER GREATER THAN 100.4 F (38 C) OR HIGHER *CHILLS OR SWEATING *NAUSEA AND VOMITING THAT IS NOT CONTROLLED WITH YOUR NAUSEA MEDICATION *UNUSUAL SHORTNESS OF BREATH *UNUSUAL BRUISING OR BLEEDING *URINARY PROBLEMS (pain or burning when urinating, or frequent urination) *BOWEL PROBLEMS (unusual diarrhea, constipation, pain near the anus) TENDERNESS IN MOUTH AND THROAT WITH OR WITHOUT PRESENCE OF ULCERS (sore throat, sores in mouth, or a toothache) UNUSUAL RASH, SWELLING OR PAIN  UNUSUAL VAGINAL DISCHARGE OR ITCHING   Items with * indicate a potential emergency and should be followed up as soon as possible or go to the Emergency Department if any problems should occur.  Please show the CHEMOTHERAPY ALERT CARD or IMMUNOTHERAPY ALERT  CARD at check-in to the Emergency Department and triage nurse.  Should you have questions after your visit or need to cancel or reschedule your appointment, please contact [facility]  Dept: [phone]  and follow the prompts.  Office hours are 8:00 a.m. to 4:30 p.m. Monday - Friday. Please note that voicemails left after 4:00 p.m. may not be returned until the following business day.  We are closed weekends and major holidays. You have access to a nurse at all times for urgent questions. Please call the main number to the clinic Dept: [phone] and follow the prompts.   For any non-urgent questions, you may also contact your provider using MyChart. We now offer e-Visits for anyone 59 and older to request care online for non-urgent symptoms. For details visit [email or web address].   Also download the MyChart app! Go to the app store, search "MyChart", open the app, select [facility], and log in with your MyChart username and password.  Masks are optional in the cancer centers. If you would like for your care team to wear a mask while they are taking care of you, please let them know. For doctor visits, patients may have with them one support Ascencion Coye who is at least 72 years old. At this time, visitors are not allowed in the infusion area.

## 2022-06-05 NOTE — Progress Notes (Signed)
    S:   Visit was performed via Video visit due to patient transportation limitations.  Patient was located at home.  Visit was conducted from [facility].  Chief Complaint  Patient presents with   Medication Management    Diabetes Follow-Up   [name] is a 71 y.o. female who presents for diabetes evaluation, education, and management.  PMH is significant for breast cancer, hypertension, anxiety, and obesity.  Patient last seen in Pharmacy clinic on 05/15/22.  Patient was last seen by Primary Care Provider, Dr. Erin Hearing, on 04/08/22.  At last visit, Lantus (insulin glargine) was reduced from 37 units to 35 units and Humalog (insulin lispro) was reduced from 18 to 16 units.   Today, patient presents in good spirits and reports her blood sugars are doing well. Offered patient both flu and COVID-19 vaccine; however, patient denied. Reports UTI symptoms have resolved since discontinuing antibiotics.   Current diabetes medications include: Lantus (insulin glargine) 35 units once daily, Humalog (insulin lispro) 15 units TID AC (prescribed at 16 units TID AC) and Ozempic 0.5mg  weekly (Tuesday's)  Patient reports adherence to taking all medications as prescribed.   Insurance coverage: [facility] Medicare  Patient denies hypoglycemic events.  Patient reports nocturia (nighttime urination). ~2x/night Patient reports neuropathy (nerve pain). Patient denies visual changes.  Patient reported dietary habits: Eats 2 meals/day, typically lunch and dinner.  Breakfast: coffee Snacks: popcorn, salad  Patient-reported exercise habits: does chair exercises    O:  05/22/2022-06/04/2022 CGM Download:  % Time CGM is active: 83%% Average Glucose: 157 mg/dL Glucose Management Indicator: 7.1%  Glucose Variability: 22.4% (goal <36%) Time in Goal:  - Time in range 70-180: 77% - Time above range: 23% - Time below range: 0%  Lab Results  Component Value Date   HGBA1C 6.3 04/08/2022     Lipid Panel     Component Value Date/Time   CHOL 150 11/08/2019 1344   TRIG 107 04/30/2020 1626   HDL 66 11/08/2019 1344   CHOLHDL 2.3 11/08/2019 1344   CHOLHDL 2.5 10/27/2012 1653   VLDL 30 10/27/2012 1653   LDLCALC 60 11/08/2019 1344    A/P: Diabetes longstanding currently close to goal based on CGM data. Patient is able to verbalize appropriate hypoglycemia management plan. Medication adherence appears appropriate. -Decrease dose of basal Lantus (insulin glargine) from 35 units to 30 units each morning after increasing semaglutide dose.  -Decreased dose of rapid Humalog (insulin lispro) from 16 to 14 units TID before meals after increasing semaglutide dose.  -Increased dose of GLP-1 Ozempic (generic semaglutide) to 1 mg weekly after 4 doses of 0.5 mg weekly. -Patient educated on purpose, proper use, and potential adverse effects of GI upset following titration of semaglutide.  -Extensively discussed pathophysiology of diabetes, recommended lifestyle interventions, dietary effects on blood sugar control.  -Counseled on s/sx of and management of hypoglycemia.  -Next A1c anticipated February 2024.    Symptoms of Urinary Tract Infections recently treated with nitrofurantoin, now resolved.  No urinary complaint today.    Written patient instructions provided. Patient verbalized understanding of treatment plan.  Total time in face to face counseling 25 minutes.    Follow-up:  Pharmacist one month via video visit. Patient seen with Geraldo Docker, PharmD Candidate, Garrel Ridgel PharmD Candidate.and Joseph Art, PharmD, PGY2 Pharmacy Resident.

## 2022-06-05 NOTE — Patient Instructions (Signed)
Following last dose of 0.5mg  Ozempic (semaglutide) next Tuesday, please Increase your Ozempic (semaglutide) to 1mg  once weekly in two weeks.  At that time, Decrease Lantus to 30 units once daily and Decrease Humalog to 14 units once daily.

## 2022-06-05 NOTE — Addendum Note (Signed)
Addended by: Dicie Beam D on: 06/05/2022 02:24 PM   Modules accepted: Orders

## 2022-06-05 NOTE — Assessment & Plan Note (Signed)
Diabetes longstanding currently close to goal based on CGM data. Patient is able to verbalize appropriate hypoglycemia management plan. Medication adherence appears appropriate. -Decrease dose of basal Lantus (insulin glargine) from 35 units to 30 units each morning after increasing semaglutide dose.  -Decreased dose of rapid Humalog (insulin lispro) from 16 to 14 units TID before meals after increasing semaglutide dose.  -Increased dose of GLP-1 Ozempic (generic semaglutide) to 1 mg weekly after 4 doses of 0.5 mg weekly. -Patient educated on purpose, proper use, and potential adverse effects of GI upset following titration of semaglutide.  -Extensively discussed pathophysiology of diabetes, recommended lifestyle interventions, dietary effects on blood sugar control.  -Counseled on s/sx of and management of hypoglycemia.

## 2022-06-09 ENCOUNTER — Other Ambulatory Visit: Payer: Self-pay | Admitting: Family Medicine

## 2022-06-09 ENCOUNTER — Other Ambulatory Visit: Payer: Self-pay

## 2022-06-09 DIAGNOSIS — R062 Wheezing: Secondary | ICD-10-CM | POA: Diagnosis not present

## 2022-06-09 DIAGNOSIS — U071 COVID-19: Secondary | ICD-10-CM | POA: Diagnosis not present

## 2022-06-09 DIAGNOSIS — C50919 Malignant neoplasm of unspecified site of unspecified female breast: Secondary | ICD-10-CM

## 2022-06-09 DIAGNOSIS — M25569 Pain in unspecified knee: Secondary | ICD-10-CM | POA: Diagnosis not present

## 2022-06-09 DIAGNOSIS — Z515 Encounter for palliative care: Secondary | ICD-10-CM

## 2022-06-09 DIAGNOSIS — G893 Neoplasm related pain (acute) (chronic): Secondary | ICD-10-CM

## 2022-06-09 DIAGNOSIS — E1149 Type 2 diabetes mellitus with other diabetic neurological complication: Secondary | ICD-10-CM

## 2022-06-09 DIAGNOSIS — I5020 Unspecified systolic (congestive) heart failure: Secondary | ICD-10-CM | POA: Diagnosis not present

## 2022-06-09 MED ORDER — ONDANSETRON HCL 8 MG PO TABS: 8.0000 mg | ORAL_TABLET | Freq: Three times a day (TID) | ORAL | 0 refills | Status: DC | PRN

## 2022-06-09 MED ORDER — OXYCODONE HCL 5 MG PO TABS: 5.0000 mg | ORAL_TABLET | Freq: Four times a day (QID) | ORAL | 0 refills | Status: DC | PRN

## 2022-06-09 MED ORDER — PREGABALIN 50 MG PO CAPS: 50.0000 mg | ORAL_CAPSULE | Freq: Three times a day (TID) | ORAL | 1 refills | Status: DC

## 2022-06-09 NOTE — Telephone Encounter (Signed)
Pt called for med refills, see new orders

## 2022-06-09 NOTE — Progress Notes (Signed)
Reviewed: I agree with Dr. Koval's documentation and management. 

## 2022-06-10 DIAGNOSIS — R062 Wheezing: Secondary | ICD-10-CM | POA: Diagnosis not present

## 2022-06-10 DIAGNOSIS — M25569 Pain in unspecified knee: Secondary | ICD-10-CM | POA: Diagnosis not present

## 2022-06-10 DIAGNOSIS — U071 COVID-19: Secondary | ICD-10-CM | POA: Diagnosis not present

## 2022-06-10 DIAGNOSIS — I5020 Unspecified systolic (congestive) heart failure: Secondary | ICD-10-CM | POA: Diagnosis not present

## 2022-06-20 ENCOUNTER — Telehealth: Payer: Self-pay

## 2022-06-20 NOTE — Telephone Encounter (Signed)
Pt called reporting pain in her legs, per Lexine Baton, NP, pt to take 2 pregablain caps (100mg ) in the AM and PM and one in the afternoon (50mg ) pt informed and verbalized understanding. No further needs at this time.

## 2022-06-25 ENCOUNTER — Inpatient Hospital Stay: Payer: Medicare Other | Attending: Oncology | Admitting: Nurse Practitioner

## 2022-06-25 ENCOUNTER — Encounter: Payer: Self-pay | Admitting: Nurse Practitioner

## 2022-06-25 DIAGNOSIS — K5900 Constipation, unspecified: Secondary | ICD-10-CM | POA: Insufficient documentation

## 2022-06-25 DIAGNOSIS — M792 Neuralgia and neuritis, unspecified: Secondary | ICD-10-CM

## 2022-06-25 DIAGNOSIS — C50919 Malignant neoplasm of unspecified site of unspecified female breast: Secondary | ICD-10-CM | POA: Diagnosis not present

## 2022-06-25 DIAGNOSIS — F419 Anxiety disorder, unspecified: Secondary | ICD-10-CM | POA: Insufficient documentation

## 2022-06-25 DIAGNOSIS — G893 Neoplasm related pain (acute) (chronic): Secondary | ICD-10-CM | POA: Diagnosis not present

## 2022-06-25 DIAGNOSIS — Z515 Encounter for palliative care: Secondary | ICD-10-CM

## 2022-06-25 DIAGNOSIS — R530 Neoplastic (malignant) related fatigue: Secondary | ICD-10-CM | POA: Diagnosis not present

## 2022-06-25 DIAGNOSIS — Z79811 Long term (current) use of aromatase inhibitors: Secondary | ICD-10-CM | POA: Insufficient documentation

## 2022-06-25 DIAGNOSIS — C7802 Secondary malignant neoplasm of left lung: Secondary | ICD-10-CM | POA: Insufficient documentation

## 2022-06-25 DIAGNOSIS — Z87891 Personal history of nicotine dependence: Secondary | ICD-10-CM | POA: Insufficient documentation

## 2022-06-25 DIAGNOSIS — C7800 Secondary malignant neoplasm of unspecified lung: Secondary | ICD-10-CM | POA: Diagnosis not present

## 2022-06-25 DIAGNOSIS — K5903 Drug induced constipation: Secondary | ICD-10-CM

## 2022-06-25 DIAGNOSIS — Z5112 Encounter for antineoplastic immunotherapy: Secondary | ICD-10-CM | POA: Insufficient documentation

## 2022-06-25 DIAGNOSIS — C7801 Secondary malignant neoplasm of right lung: Secondary | ICD-10-CM | POA: Insufficient documentation

## 2022-06-25 MED ORDER — DIAZEPAM 5 MG PO TABS: 5.0000 mg | ORAL_TABLET | Freq: Two times a day (BID) | ORAL | 0 refills | Status: DC | PRN

## 2022-06-25 NOTE — Progress Notes (Signed)
[facility]  [phone]) [phone] Fax:(336) [phone]   Name: [name] Date: 06/25/2022 MRN: [id number]  DOB: 05-01-1951  Patient Care Team: Jenna Covert, MD as PCP - General (Family Medicine) Jenna Dresser, MD as Consulting Physician (Cardiology) Jenna Pike, MD as Consulting Physician (Hematology and Oncology)   I connected with [name] on 06/25/22 at  1:00 PM EST by Phone and verified that I am speaking with the correct person using two identifiers.   I discussed the limitations, risks, security and privacy concerns of performing an evaluation and management service by telemedicine and the availability of in-person appointments. I also discussed with the patient that there may be a patient responsible charge related to this service. The patient expressed understanding and agreed to proceed.   Other persons participating in the visit and their role in the encounter: [place], RN    Patient's location: Home   Provider's location: [place]    INTERVAL HISTORY: [name] is a 71 y.o. female with  medical history including metastatic breast cancer with numerous pulmonary nodules bilaterally s/p chemotherapy currently on herceptin for maintenance. Now with complaints of uncontrolled back pain.  Palliative ask to see for symptom management.   SOCIAL HISTORY:     reports that she quit smoking about 53 years ago. Her smoking use included cigarettes. She has a 15.00 pack-year smoking history. She has never used smokeless tobacco. She reports that she does not drink alcohol and does not use drugs.  ADVANCE DIRECTIVES:    CODE STATUS:   PAST MEDICAL HISTORY: Past Medical History:  Diagnosis Date   Arthritis    Back pain    Breast cancer ([place]) dx'd 11/2012   left   Chest pain    COPD (chronic obstructive pulmonary disease) ([place])    Diabetes mellitus without complication ([place]) 01/02/6159   Ear pain    Fatty  liver 6/03   Hypertension    Lung disease    Lung metastases dx'd 11/2012   Obesity, unspecified    Other abnormal glucose    Suicide attempt ([place]) 1996   Syncope and collapse    Unspecified sleep apnea     ALLERGIES:  is allergic to meperidine hcl, penicillins, and aspirin.  MEDICATIONS:  Current Outpatient Medications  Medication Sig Dispense Refill   albuterol (VENTOLIN HFA) 108 (90 Base) MCG/ACT inhaler INHALE 2 PUFFS INTO THE LUNGS EVERY 6 HOURS AS NEEDED FOR WHEEZE (Patient not taking: Reported on 06/05/2022) 8.5 each 1   Blood Glucose Monitoring Suppl (ONE TOUCH ULTRA 2) w/Device KIT 1 kit by Does not apply route QID. ICD 10-code: E11.49. 1 kit 0   diazepam (VALIUM) 5 MG tablet Take 1 tablet (5 mg total) by mouth every 12 (twelve) hours as needed for anxiety. 30 tablet 0   fluconazole (DIFLUCAN) 100 MG tablet Take 1 tablet (100 mg total) by mouth daily. (Patient not taking: Reported on 06/05/2022) 10 tablet 1   glucose blood (ONETOUCH ULTRA) test strip CHECK BLOOD SUGAR ONCE A DAY 100 strip 2   insulin glargine (LANTUS SOLOSTAR) 100 UNIT/ML Solostar Pen Inject 30 Units into the skin daily. 15 mL 3   insulin lispro (HUMALOG KWIKPEN) 100 UNIT/ML KwikPen Inject 14 u subq three times a day 15 mL 1   Insulin Pen Needle (PEN NEEDLES) 30G X 8 MM MISC Please use to inject insulin 4 times daily. 200 each 3   ketoconazole (NIZORAL) 2 % cream  Apply 1 application. topically daily. (Patient not taking: Reported on 06/05/2022) 15 g 0   letrozole (FEMARA) 2.5 MG tablet Take 1 tablet (2.5 mg total) by mouth daily. 90 tablet 12   loperamide (IMODIUM) 2 MG capsule TAKE 1 CAPSULE (2 MG TOTAL) BY MOUTH AS NEEDED FOR DIARRHEA OR LOOSE STOOLS. 30 capsule 1   ondansetron (ZOFRAN) 8 MG tablet Take 1 tablet (8 mg total) by mouth every 8 (eight) hours as needed for nausea or vomiting. 20 tablet 0   oxyCODONE (OXY IR/ROXICODONE) 5 MG immediate release tablet Take 1 tablet (5 mg total) by mouth every 6 (six)  hours as needed for severe pain. 90 tablet 0   OXYGEN Inhale into the lungs.     pregabalin (LYRICA) 50 MG capsule Take 1 capsule (50 mg total) by mouth 3 (three) times daily. 90 capsule 1   Semaglutide, 1 MG/DOSE, 4 MG/3ML SOPN Inject 1 mg into the skin once a week. 3 mL 3   No current facility-administered medications for this visit.    VITAL SIGNS: There were no vitals taken for this visit. There were no vitals filed for this visit.  Estimated body mass index is 53.93 kg/m as calculated from the following:   Height as of 04/10/22: _0  (1.626 m).   Weight as of 06/05/22: 314 lb 3 oz (142.5 kg).   PERFORMANCE STATUS (ECOG) : 1 - Symptomatic but completely ambulatory  IMPRESSION:  I connected with [name] by phone for symptom management follow-up. No acute distress identified. Endorses some generalized weakness. Shares she has been able to get in and out of the bathtub. Is trying to remain as active as possible. Xara is inquiring about a home health aid. Education provided that this would not be covered under her Medicare. Did offer to have PT/OT evaluation however she declines at this time. Thinks she may be approved for Medicaid, if this is the case she may can receive services through CAPs. She plans to call and speak with her case worker.   Neoplasm/Chronic/Neuropathic pain [name]'s pain is well controlled on oxycodone  We discussed her use of Oxy IR as needed for pain. She is tolerating well. Feels some improvement in neuropathic pain after changes to her Lyrica.  She is taking Lyrica three times daily.   We will continue to closely monitor and adjust medications accordingly.   Constipation Reports regular bowel movements. Ongoing education provided on the use of miralax daily to prevent constipation.   Anxiety Controlled with valium as needed. She is taking as prescribed. Does not require daily.   We discussed Her current illness and what it means in the larger context  of Her on-going co-morbidities. Natural disease trajectory and expectations were discussed.  7/27: She becomes tearful expressing her disease progression over time. She reports her children are aware of her condition to an extent however she chooses not to "tell them" everything until she knows she is facing end-of-life. I created space and opportunity to allow her to express her thoughts and feelings. Freddie states she is having some anxiety to the point she feels as though it is hard to breath and some nervousness. This occurs as she thinks often about her cancer, not if but when will she have to face end-of-life, and how much longer will she make it. Emotional support provided.   Education provided on use of benzodiazepines short-term vs long-term. I discussed at length use of valium in addition to other non-pharmacological therapeutic methods. She  verbalized understanding and appreciation.   We will continue to closely monitor and support.    I discussed the importance of continued conversation with family and their medical providers regarding overall plan of care and treatment options, ensuring decisions are within the context of the patients values and GOCs.  PLAN: Oxy IR as needed for breakthrough pain Declines to restart or try different medication focused on long-acting pain relief due to potential symptoms.  Valium twice daily as needed for anxiety  Pregabalin 142m three times daily  Miralax daily for bowel regimen Ongoing goals of care discussions and support I will plan to see her back in the office in 3-4 weeks in collaboration with her other oncology appointments. Sooner if needed.    Patient expressed understanding and was in agreement with this plan. She also understands that She can call the clinic at any time with any questions, concerns, or complaints.    Any controlled substances utilized were prescribed in the context of palliative care. PDMP has been reviewed.   Time  Total: 20 min   Visit consisted of counseling and education dealing with the complex and emotionally intense issues of symptom management and palliative care in the setting of serious and potentially life-threatening illness.Greater than 50%  of this time was spent counseling and coordinating care related to the above assessment and plan.  NAlda Lea AGPCNP-BC  Palliative Medicine Team/[facility]

## 2022-06-26 ENCOUNTER — Other Ambulatory Visit: Payer: Self-pay | Admitting: Nurse Practitioner

## 2022-06-26 ENCOUNTER — Telehealth: Payer: Self-pay

## 2022-06-26 DIAGNOSIS — Z515 Encounter for palliative care: Secondary | ICD-10-CM

## 2022-06-26 DIAGNOSIS — C50919 Malignant neoplasm of unspecified site of unspecified female breast: Secondary | ICD-10-CM

## 2022-06-26 DIAGNOSIS — E1149 Type 2 diabetes mellitus with other diabetic neurological complication: Secondary | ICD-10-CM | POA: Diagnosis not present

## 2022-06-26 DIAGNOSIS — M792 Neuralgia and neuritis, unspecified: Secondary | ICD-10-CM

## 2022-06-26 DIAGNOSIS — G893 Neoplasm related pain (acute) (chronic): Secondary | ICD-10-CM

## 2022-06-26 MED ORDER — PREGABALIN 100 MG PO CAPS: 100.0000 mg | ORAL_CAPSULE | Freq: Three times a day (TID) | ORAL | 1 refills | Status: DC

## 2022-06-26 NOTE — Telephone Encounter (Signed)
Pt called for a refill on lyrica, pt informed of lyrica refill already on file, no further needs at this time.

## 2022-07-03 ENCOUNTER — Inpatient Hospital Stay: Payer: Medicare Other

## 2022-07-03 ENCOUNTER — Other Ambulatory Visit: Payer: Self-pay

## 2022-07-03 ENCOUNTER — Telehealth (INDEPENDENT_AMBULATORY_CARE_PROVIDER_SITE_OTHER): Payer: Self-pay | Admitting: Pharmacist

## 2022-07-03 VITALS — BP 142/62 | HR 65 | Temp 97.6°F | Resp 19 | Wt 312.0 lb

## 2022-07-03 DIAGNOSIS — Z170 Estrogen receptor positive status [ER+]: Secondary | ICD-10-CM

## 2022-07-03 DIAGNOSIS — Z79811 Long term (current) use of aromatase inhibitors: Secondary | ICD-10-CM | POA: Diagnosis not present

## 2022-07-03 DIAGNOSIS — K5900 Constipation, unspecified: Secondary | ICD-10-CM | POA: Diagnosis not present

## 2022-07-03 DIAGNOSIS — F419 Anxiety disorder, unspecified: Secondary | ICD-10-CM | POA: Diagnosis not present

## 2022-07-03 DIAGNOSIS — G893 Neoplasm related pain (acute) (chronic): Secondary | ICD-10-CM | POA: Diagnosis not present

## 2022-07-03 DIAGNOSIS — Z5112 Encounter for antineoplastic immunotherapy: Secondary | ICD-10-CM | POA: Diagnosis not present

## 2022-07-03 DIAGNOSIS — C50919 Malignant neoplasm of unspecified site of unspecified female breast: Secondary | ICD-10-CM

## 2022-07-03 DIAGNOSIS — Z87891 Personal history of nicotine dependence: Secondary | ICD-10-CM | POA: Diagnosis not present

## 2022-07-03 DIAGNOSIS — E1149 Type 2 diabetes mellitus with other diabetic neurological complication: Secondary | ICD-10-CM

## 2022-07-03 DIAGNOSIS — C7802 Secondary malignant neoplasm of left lung: Secondary | ICD-10-CM | POA: Diagnosis not present

## 2022-07-03 DIAGNOSIS — C7801 Secondary malignant neoplasm of right lung: Secondary | ICD-10-CM | POA: Diagnosis not present

## 2022-07-03 DIAGNOSIS — Z95828 Presence of other vascular implants and grafts: Secondary | ICD-10-CM

## 2022-07-03 LAB — CBC WITH DIFFERENTIAL (CANCER CENTER ONLY)
Abs Immature Granulocytes: 0.02 10*3/uL (ref 0.00–0.07)
Basophils Absolute: 0 10*3/uL (ref 0.0–0.1)
Basophils Relative: 0 %
Eosinophils Absolute: 0.1 10*3/uL (ref 0.0–0.5)
Eosinophils Relative: 2 %
HCT: 39.8 % (ref 36.0–46.0)
Hemoglobin: 13.1 g/dL (ref 12.0–15.0)
Immature Granulocytes: 0 %
Lymphocytes Relative: 32 %
Lymphs Abs: 2.3 10*3/uL (ref 0.7–4.0)
MCH: 29.8 pg (ref 26.0–34.0)
MCHC: 32.9 g/dL (ref 30.0–36.0)
MCV: 90.7 fL (ref 80.0–100.0)
Monocytes Absolute: 0.5 10*3/uL (ref 0.1–1.0)
Monocytes Relative: 8 %
Neutro Abs: 4.2 10*3/uL (ref 1.7–7.7)
Neutrophils Relative %: 58 %
Platelet Count: 206 10*3/uL (ref 150–400)
RBC: 4.39 MIL/uL (ref 3.87–5.11)
RDW: 13.7 % (ref 11.5–15.5)
WBC Count: 7.2 10*3/uL (ref 4.0–10.5)
nRBC: 0 % (ref 0.0–0.2)

## 2022-07-03 LAB — CMP (CANCER CENTER ONLY)
ALT: 46 U/L — ABNORMAL HIGH (ref 0–44)
AST: 35 U/L (ref 15–41)
Albumin: 3.7 g/dL (ref 3.5–5.0)
Alkaline Phosphatase: 77 U/L (ref 38–126)
Anion gap: 6 (ref 5–15)
BUN: 19 mg/dL (ref 8–23)
CO2: 33 mmol/L — ABNORMAL HIGH (ref 22–32)
Calcium: 9.5 mg/dL (ref 8.9–10.3)
Chloride: 102 mmol/L (ref 98–111)
Creatinine: 0.76 mg/dL (ref 0.44–1.00)
GFR, Estimated: >60 mL/min (ref >60)
Glucose, Bld: 134 mg/dL — ABNORMAL HIGH (ref 70–99)
Potassium: 4 mmol/L (ref 3.5–5.1)
Sodium: 141 mmol/L (ref 135–145)
Total Bilirubin: 0.4 mg/dL (ref 0.3–1.2)
Total Protein: 7.4 g/dL (ref 6.5–8.1)

## 2022-07-03 MED ORDER — ACETAMINOPHEN 325 MG PO TABS
650.0000 mg | ORAL_TABLET | Freq: Once | ORAL | Status: AC
  Administered 2022-07-03: 650 mg via ORAL
  Filled 2022-07-03: qty 2

## 2022-07-03 MED ORDER — HEPARIN SOD (PORK) LOCK FLUSH 100 UNIT/ML IV SOLN
500.0000 [IU] | Freq: Once | INTRAVENOUS | Status: AC | PRN
  Administered 2022-07-03: 500 [IU]

## 2022-07-03 MED ORDER — OZEMPIC (2 MG/DOSE) 8 MG/3ML ~~LOC~~ SOPN: 2.0000 mg | PEN_INJECTOR | SUBCUTANEOUS | 6 refills | Status: DC

## 2022-07-03 MED ORDER — LANTUS SOLOSTAR 100 UNIT/ML ~~LOC~~ SOPN: 24.0000 [IU] | PEN_INJECTOR | Freq: Every day | SUBCUTANEOUS | 3 refills | Status: DC

## 2022-07-03 MED ORDER — TRASTUZUMAB-ANNS CHEMO 150 MG IV SOLR
750.0000 mg | Freq: Once | INTRAVENOUS | Status: AC
  Administered 2022-07-03: 750 mg via INTRAVENOUS
  Filled 2022-07-03: qty 35.72

## 2022-07-03 MED ORDER — DIPHENHYDRAMINE HCL 25 MG PO CAPS
25.0000 mg | ORAL_CAPSULE | Freq: Once | ORAL | Status: AC
  Administered 2022-07-03: 25 mg via ORAL
  Filled 2022-07-03: qty 1

## 2022-07-03 MED ORDER — INSULIN LISPRO (1 UNIT DIAL) 100 UNIT/ML (KWIKPEN): PEN_INJECTOR | SUBCUTANEOUS | 3 refills | Status: DC

## 2022-07-03 MED ORDER — SODIUM CHLORIDE 0.9% FLUSH
10.0000 mL | Freq: Once | INTRAVENOUS | Status: AC
  Administered 2022-07-03: 10 mL via INTRAVENOUS

## 2022-07-03 MED ORDER — SODIUM CHLORIDE 0.9% FLUSH
10.0000 mL | Freq: Once | INTRAVENOUS | Status: AC
  Administered 2022-07-03: 10 mL

## 2022-07-03 MED ORDER — SODIUM CHLORIDE 0.9 % IV SOLN: Freq: Once | INTRAVENOUS | Status: AC

## 2022-07-03 MED ORDER — LORAZEPAM 2 MG/ML IJ SOLN
0.5000 mg | Freq: Once | INTRAMUSCULAR | Status: AC
  Administered 2022-07-03: 0.5 mg via INTRAVENOUS
  Filled 2022-07-03: qty 1

## 2022-07-03 NOTE — Progress Notes (Signed)
Reviewed: I agree with Dr. Koval's documentation and management. 

## 2022-07-03 NOTE — Assessment & Plan Note (Signed)
Diabetes longstanding currently controlled below goal A1c <7% and TIR >70% without hypoglycemia based on last A1c and CGM data. Patient is able to verbalize appropriate hypoglycemia management plan. Medication adherence appears appropriate. Pt desires decrease in insulin and increase in GLP-1RA. Pt reports eating snacks when BG approaches 100 which is likely inhibiting weight loss. Decreasing insulin and increasing Ozempic should promote further weight loss and hopefully help to increase physical activity -Decrease dose of basal Lantus (insulin glargine) from 30 units to 24 units each morning after increasing semaglutide dose.  -Decreased dose of rapid Humalog (insulin lispro) from 14 to 10 units TID before meals after increasing semaglutide dose.  -Increased dose of GLP-1 Ozempic (generic semaglutide) to 2 mg weekly after 4 doses of 1 mg weekly. -Patient educated on purpose, proper use, and potential adverse effects of GI upset following titration of semaglutide.  -Extensively discussed pathophysiology of diabetes, recommended lifestyle interventions, dietary effects on blood sugar control. - Encourage pt to increase water intake to 64 oz per day and slowly work towards increasing step count.  -Counseled on s/sx of and management of hypoglycemia.

## 2022-07-03 NOTE — Patient Instructions (Signed)
[facility]  Discharge Instructions: Thank you for choosing [facility] to provide your oncology and hematology care.   If you have a lab appointment with the [facility], please go directly to the [facility] and check in at the registration area.   Wear comfortable clothing and clothing appropriate for easy access to any Portacath or PICC line.   We strive to give you quality time with your provider. You may need to reschedule your appointment if you arrive late (15 or more minutes).  Arriving late affects you and other patients whose appointments are after yours.  Also, if you miss three or more appointments without notifying the office, you may be dismissed from the clinic at the provider's discretion.      For prescription refill requests, have your pharmacy contact our office and allow 72 hours for refills to be completed.    Today you received the following chemotherapy and/or immunotherapy agents; trastuzumab      To help prevent nausea and vomiting after your treatment, we encourage you to take your nausea medication as directed.  BELOW ARE SYMPTOMS THAT SHOULD BE REPORTED IMMEDIATELY: *FEVER GREATER THAN 100.4 F (38 C) OR HIGHER *CHILLS OR SWEATING *NAUSEA AND VOMITING THAT IS NOT CONTROLLED WITH YOUR NAUSEA MEDICATION *UNUSUAL SHORTNESS OF BREATH *UNUSUAL BRUISING OR BLEEDING *URINARY PROBLEMS (pain or burning when urinating, or frequent urination) *BOWEL PROBLEMS (unusual diarrhea, constipation, pain near the anus) TENDERNESS IN MOUTH AND THROAT WITH OR WITHOUT PRESENCE OF ULCERS (sore throat, sores in mouth, or a toothache) UNUSUAL RASH, SWELLING OR PAIN  UNUSUAL VAGINAL DISCHARGE OR ITCHING   Items with * indicate a potential emergency and should be followed up as soon as possible or go to the Emergency Department if any problems should occur.  Please show the CHEMOTHERAPY ALERT CARD or IMMUNOTHERAPY ALERT CARD at check-in  to the Emergency Department and triage nurse.  Should you have questions after your visit or need to cancel or reschedule your appointment, please contact [facility]  Dept: [phone]  and follow the prompts.  Office hours are 8:00 a.m. to 4:30 p.m. Monday - Friday. Please note that voicemails left after 4:00 p.m. may not be returned until the following business day.  We are closed weekends and major holidays. You have access to a nurse at all times for urgent questions. Please call the main number to the clinic Dept: [phone] and follow the prompts.   For any non-urgent questions, you may also contact your provider using MyChart. We now offer e-Visits for anyone 85 and older to request care online for non-urgent symptoms. For details visit [email or web address].   Also download the MyChart app! Go to the app store, search "MyChart", open the app, select [facility], and log in with your MyChart username and password.  Masks are optional in the cancer centers. If you would like for your care team to wear a mask while they are taking care of you, please let them know. You may have one support person who is at least 72 years old accompany you for your appointments.

## 2022-07-03 NOTE — Patient Instructions (Addendum)
It was great to talk with you today!  Increase Ozempic to 2 mg once weekly on Tuesdays. We sent a new prescription to your pharmacy.  Decrease Lantus (insulin glargine) to 24 units daily  Decrease Humalog (insulin lispro) to 10 units with meals  Continue to work towards increasing your steps and making healthy food choices. Work towards increasing water intake to 64 oz per day. Great work!  We will see you again on January 4th at 11:15AM

## 2022-07-03 NOTE — Progress Notes (Signed)
S:   Visit was performed via Video visit due to patient transportation limitations.  Patient was located at home.  Visit was conducted from [place].  Chief Complaint  Patient presents with   Medication Management    DM f/u    [name] is a 71 y.o. female who presents for diabetes evaluation, education, and management.  PMH is significant for breast cancer, hypertension, anxiety, and obesity.  Patient last seen in Pharmacy clinic on 05/15/22.  Patient was last seen by Primary Care Provider, Dr. Erin Hearing, on 04/08/22, last seen by Rx team on 06/05/22.  At last visit, Lantus (insulin glargine) was reduced from 35 units to 30 units and Humalog (insulin lispro) was reduced from 16 to 14 units TID AC.   Today, patient presents in good spirits and reports her blood sugars are doing well. Does not report weight loss. Has not seen weight below 308 lbs (typically 312-314 lbs). Blood sugars are controlled, lowest noted 80, 90, 112. Eats a snack if BG ~100 or less. Denies AE with increased dose of Ozempic (has taken at least 4 doses) including constipation, bloating, diarrhea. Does have intermittent constipation from narcotics. Has some diarrhea after chemotherapy- but medication helps. Ozempic pen has run out. Having trouble increasing step count because she has frequent pain. Pain today is 8/10.  Current diabetes medications include: Lantus (insulin glargine) 30 units once daily, Humalog (insulin lispro) 14 units TID AC, and Ozempic 1mg  weekly (Tuesdays)  Patient reports adherence to taking all medications as prescribed.   Insurance coverage: [facility] Medicare  Patient denies hypoglycemic events.  Patient reports nocturia (nighttime urination). ~2x/night Patient reports neuropathy (nerve pain). Patient denies visual changes.  Patient reported dietary habits: Eats 2 meals/day, typically lunch and dinner.  Breakfast: coffee Lunch: trying to eat salads (protein: scrambled  egg) Dinner: hamburger, hot dog (reducing bun) Snacks: popcorn, salad, [place] [employer] roll Drinks: not drinking enough water (forgets), ~ 16oz water/day, propel   Patient-reported exercise habits: does chair exercises, trying to increase steps (average 28)  O:  05/22/2022-06/04/2022 CGM Download:  % Time CGM is active: 85% Average Glucose: 150 mg/dL Glucose Management Indicator: 6.9%  Glucose Variability: 19.3% (goal <36%) Time in Goal:  - Time in range 70-180: 85% - Time above range: 15% - Time below range: 0%  Lab Results  Component Value Date   HGBA1C 6.3 04/08/2022    Lipid Panel     Component Value Date/Time   CHOL 150 11/08/2019 1344   TRIG 107 04/30/2020 1626   HDL 66 11/08/2019 1344   CHOLHDL 2.3 11/08/2019 1344   CHOLHDL 2.5 10/27/2012 1653   VLDL 30 10/27/2012 1653   LDLCALC 60 11/08/2019 1344    A/P: Diabetes longstanding currently controlled below goal A1c <7% and TIR >70% without hypoglycemia based on last A1c and CGM data. Patient is able to verbalize appropriate hypoglycemia management plan. Medication adherence appears appropriate. Pt desires decrease in insulin and increase in GLP-1RA. Pt reports eating snacks when BG approaches 100 which is likely inhibiting weight loss. Decreasing insulin and increasing Ozempic should promote further weight loss and hopefully help to increase physical activity -Decrease dose of basal Lantus (insulin glargine) from 30 units to 24 units each morning after increasing semaglutide dose.  -Decreased dose of rapid Humalog (insulin lispro) from 14 to 10 units TID before meals after increasing semaglutide dose.  -Increased dose of GLP-1 Ozempic (generic semaglutide) to 2 mg weekly after 4 doses of 1 mg  weekly. -Patient educated on purpose, proper use, and potential adverse effects of GI upset following titration of semaglutide.  -Extensively discussed pathophysiology of diabetes, recommended lifestyle interventions, dietary effects  on blood sugar control. - Encourage pt to increase water intake to 64 oz per day and slowly work towards increasing step count.  -Counseled on s/sx of and management of hypoglycemia.  -Next A1c anticipated March 2024.   Written patient instructions provided. Patient verbalized understanding of treatment plan.  Total time in face to face counseling 25 minutes.    Follow-up:  Pharmacist ~2 month via video visit. Patient seen with Geraldo Docker, PharmD Candidate, Park Liter, PharmD Candidate and Joseph Art, PharmD, PGY2 Pharmacy Resident.

## 2022-07-07 ENCOUNTER — Other Ambulatory Visit (HOSPITAL_COMMUNITY): Payer: Self-pay

## 2022-07-07 ENCOUNTER — Other Ambulatory Visit: Payer: Self-pay

## 2022-07-07 ENCOUNTER — Telehealth: Payer: Self-pay

## 2022-07-07 DIAGNOSIS — E1149 Type 2 diabetes mellitus with other diabetic neurological complication: Secondary | ICD-10-CM

## 2022-07-07 MED ORDER — OZEMPIC (2 MG/DOSE) 8 MG/3ML ~~LOC~~ SOPN
2.0000 mg | PEN_INJECTOR | SUBCUTANEOUS | 6 refills | Status: DC
  Filled 2022-07-07: qty 3, fill #0
  Filled 2022-07-07: qty 3, 28d supply, fill #0
  Filled 2022-07-29: qty 3, 28d supply, fill #1
  Filled 2022-08-22 – 2022-08-25 (×2): qty 3, 28d supply, fill #2
  Filled 2022-09-23: qty 3, 28d supply, fill #3
  Filled 2022-10-17: qty 3, 28d supply, fill #4
  Filled 2022-11-13 – 2022-11-17 (×2): qty 3, 28d supply, fill #5
  Filled 2022-12-11: qty 3, 28d supply, fill #6

## 2022-07-07 NOTE — Telephone Encounter (Signed)
Patient calls nurse line in regards to Ozempic 2mg .   She reports [facility] has no stock, however [facility] does.   I did call and confirm stock and I have cancelled prescription at [facility].

## 2022-07-08 ENCOUNTER — Other Ambulatory Visit (HOSPITAL_COMMUNITY): Payer: Self-pay

## 2022-07-08 ENCOUNTER — Other Ambulatory Visit: Payer: Self-pay

## 2022-07-09 ENCOUNTER — Other Ambulatory Visit (HOSPITAL_COMMUNITY): Payer: Self-pay

## 2022-07-10 DIAGNOSIS — M25569 Pain in unspecified knee: Secondary | ICD-10-CM | POA: Diagnosis not present

## 2022-07-10 DIAGNOSIS — U071 COVID-19: Secondary | ICD-10-CM | POA: Diagnosis not present

## 2022-07-10 DIAGNOSIS — R062 Wheezing: Secondary | ICD-10-CM | POA: Diagnosis not present

## 2022-07-10 DIAGNOSIS — I5020 Unspecified systolic (congestive) heart failure: Secondary | ICD-10-CM | POA: Diagnosis not present

## 2022-07-11 DIAGNOSIS — I5020 Unspecified systolic (congestive) heart failure: Secondary | ICD-10-CM | POA: Diagnosis not present

## 2022-07-11 DIAGNOSIS — U071 COVID-19: Secondary | ICD-10-CM | POA: Diagnosis not present

## 2022-07-11 DIAGNOSIS — R062 Wheezing: Secondary | ICD-10-CM | POA: Diagnosis not present

## 2022-07-11 DIAGNOSIS — M25569 Pain in unspecified knee: Secondary | ICD-10-CM | POA: Diagnosis not present

## 2022-07-14 ENCOUNTER — Other Ambulatory Visit: Payer: Self-pay

## 2022-07-14 MED ORDER — DIAZEPAM 5 MG PO TABS: 5.0000 mg | ORAL_TABLET | Freq: Two times a day (BID) | ORAL | 0 refills | Status: DC | PRN

## 2022-07-14 MED ORDER — OXYCODONE HCL 5 MG PO TABS: 5.0000 mg | ORAL_TABLET | Freq: Four times a day (QID) | ORAL | 0 refills | Status: DC | PRN

## 2022-07-14 NOTE — Telephone Encounter (Signed)
Pt called for medication refill, see new orders.

## 2022-07-23 ENCOUNTER — Encounter: Payer: Self-pay | Admitting: Nurse Practitioner

## 2022-07-23 ENCOUNTER — Inpatient Hospital Stay: Payer: Medicare Other | Attending: Oncology | Admitting: Nurse Practitioner

## 2022-07-23 DIAGNOSIS — M792 Neuralgia and neuritis, unspecified: Secondary | ICD-10-CM

## 2022-07-23 DIAGNOSIS — K5900 Constipation, unspecified: Secondary | ICD-10-CM

## 2022-07-23 DIAGNOSIS — E119 Type 2 diabetes mellitus without complications: Secondary | ICD-10-CM | POA: Insufficient documentation

## 2022-07-23 DIAGNOSIS — G893 Neoplasm related pain (acute) (chronic): Secondary | ICD-10-CM | POA: Diagnosis not present

## 2022-07-23 DIAGNOSIS — Z170 Estrogen receptor positive status [ER+]: Secondary | ICD-10-CM | POA: Diagnosis not present

## 2022-07-23 DIAGNOSIS — Z86718 Personal history of other venous thrombosis and embolism: Secondary | ICD-10-CM | POA: Insufficient documentation

## 2022-07-23 DIAGNOSIS — Z171 Estrogen receptor negative status [ER-]: Secondary | ICD-10-CM | POA: Insufficient documentation

## 2022-07-23 DIAGNOSIS — Z79811 Long term (current) use of aromatase inhibitors: Secondary | ICD-10-CM | POA: Insufficient documentation

## 2022-07-23 DIAGNOSIS — C50212 Malignant neoplasm of upper-inner quadrant of left female breast: Secondary | ICD-10-CM

## 2022-07-23 DIAGNOSIS — Z515 Encounter for palliative care: Secondary | ICD-10-CM

## 2022-07-23 DIAGNOSIS — Z5112 Encounter for antineoplastic immunotherapy: Secondary | ICD-10-CM | POA: Insufficient documentation

## 2022-07-23 DIAGNOSIS — I10 Essential (primary) hypertension: Secondary | ICD-10-CM | POA: Insufficient documentation

## 2022-07-23 DIAGNOSIS — Z87891 Personal history of nicotine dependence: Secondary | ICD-10-CM | POA: Insufficient documentation

## 2022-07-23 NOTE — Progress Notes (Signed)
[name]  Telephone:([phone]) [phone] [phone]) [phone]   Name: [name] Date: 07/23/2022 MRN: [id number]  DOB: [birth year]  Patient Care Team: Jenna Covert, MD as PCP - General (Family Medicine) Jenna Dresser, MD as Consulting Physician (Cardiology) Jenna Pike, MD as Consulting Physician (Hematology and Oncology)   I connected with [name] on 07/23/22 at 11:00 AM EST by Phone and verified that I am speaking with the correct person using two identifiers.   I discussed the limitations, risks, security and privacy concerns of performing an evaluation and management service by telemedicine and the availability of in-person appointments. I also discussed with the patient that there may be a patient responsible charge related to this service. The patient expressed understanding and agreed to proceed.   Other persons participating in the visit and their role in the encounter: [place], RN    Patient's location: Home   Provider's location: [place]    INTERVAL HISTORY: [name] is a 71 y.o. female with  medical history including metastatic breast cancer with numerous pulmonary nodules bilaterally s/p chemotherapy currently on herceptin for maintenance. Now with complaints of uncontrolled back pain.  Palliative ask to see for symptom management.   SOCIAL HISTORY:     reports that she quit smoking about 53 years ago. Her smoking use included cigarettes. She has a 15.00 pack-year smoking history. She has never used smokeless tobacco. She reports that she does not drink alcohol and does not use drugs.  ADVANCE DIRECTIVES:    CODE STATUS:   PAST MEDICAL HISTORY: Past Medical History:  Diagnosis Date   Arthritis    Back pain    Breast cancer ([place]) dx'd 11/2012   left   Chest pain    COPD (chronic obstructive pulmonary disease) ([place])    Diabetes mellitus without complication ([place]) 6/81/1572   Ear pain    Fatty  liver 6/03   Hypertension    Lung disease    Lung metastases dx'd 11/2012   Obesity, unspecified    Other abnormal glucose    Suicide attempt ([place]) 1996   Syncope and collapse    Unspecified sleep apnea     ALLERGIES:  is allergic to meperidine hcl, penicillins, and aspirin.  MEDICATIONS:  Current Outpatient Medications  Medication Sig Dispense Refill   albuterol (VENTOLIN HFA) 108 (90 Base) MCG/ACT inhaler INHALE 2 PUFFS INTO THE LUNGS EVERY 6 HOURS AS NEEDED FOR WHEEZE (Patient not taking: Reported on 06/05/2022) 8.5 each 1   Blood Glucose Monitoring Suppl (ONE TOUCH ULTRA 2) w/Device KIT 1 kit by Does not apply route QID. ICD 10-code: E11.49. 1 kit 0   diazepam (VALIUM) 5 MG tablet Take 1 tablet (5 mg total) by mouth every 12 (twelve) hours as needed for anxiety. 30 tablet 0   fluconazole (DIFLUCAN) 100 MG tablet Take 1 tablet (100 mg total) by mouth daily. (Patient not taking: Reported on 06/05/2022) 10 tablet 1   glucose blood (ONETOUCH ULTRA) test strip CHECK BLOOD SUGAR ONCE A DAY 100 strip 2   insulin glargine (LANTUS SOLOSTAR) 100 UNIT/ML Solostar Pen Inject 24 Units into the skin daily. 15 mL 3   insulin lispro (HUMALOG KWIKPEN) 100 UNIT/ML KwikPen Inject 10 u subq three times a day with meals 15 mL 3   Insulin Pen Needle (PEN NEEDLES) 30G X 8 MM MISC Please use to inject insulin 4 times daily. 200 each 3   ketoconazole (NIZORAL) 2 %  cream Apply 1 application. topically daily. (Patient not taking: Reported on 06/05/2022) 15 g 0   letrozole (FEMARA) 2.5 MG tablet Take 1 tablet (2.5 mg total) by mouth daily. 90 tablet 12   loperamide (IMODIUM) 2 MG capsule TAKE 1 CAPSULE (2 MG TOTAL) BY MOUTH AS NEEDED FOR DIARRHEA OR LOOSE STOOLS. 30 capsule 1   ondansetron (ZOFRAN) 8 MG tablet Take 1 tablet (8 mg total) by mouth every 8 (eight) hours as needed for nausea or vomiting. 20 tablet 0   oxyCODONE (OXY IR/ROXICODONE) 5 MG immediate release tablet Take 1 tablet (5 mg total) by mouth  every 6 (six) hours as needed for severe pain. 90 tablet 0   OXYGEN Inhale into the lungs.     pregabalin (LYRICA) 100 MG capsule Take 1 capsule (100 mg total) by mouth 3 (three) times daily. 90 capsule 1   Semaglutide, 2 MG/DOSE, (OZEMPIC, 2 MG/DOSE,) 8 MG/3ML SOPN Inject 2 mg into the skin once a week. 3 mL 6   No current facility-administered medications for this visit.    VITAL SIGNS: There were no vitals taken for this visit. There were no vitals filed for this visit.  Estimated body mass index is 53.55 kg/m as calculated from the following:   Height as of 04/10/22: _0  (1.626 m).   Weight as of 07/03/22: 312 lb (141.5 kg).   PERFORMANCE STATUS (ECOG) : 1 - Symptomatic but completely ambulatory  IMPRESSION:  I connected with Ms. Dawe by phone for symptom management follow-up. No acute distress identified. Endorses some generalized weakness.  She is on to remain as active as possible but endorses limitations in performing ADLs independently such as bathing.  Her daughter recently moved out of town however her granddaughter is still with her and offering assistance.  Ms. Tolen is working with her insurance company to try to get approval for home health aide.  Neoplasm/Chronic/Neuropathic pain Ms. Matlin's pain is well controlled on oxycodone. She does have some breakthrough pain throughout the day however is tolerating.   We discussed her use of Oxy IR as needed for pain. She is tolerating well. Feels some improvement in neuropathic pain after changes to her Lyrica.  She is taking Lyrica 100 mg three times daily.   We will continue to closely monitor and adjust medications accordingly.   Constipation Reports regular bowel movements. Ongoing education provided on the use of miralax daily to prevent constipation.   Anxiety Controlled with valium as needed. She is taking as prescribed. Does not require daily.   We discussed Her current illness and what it means in the larger  context of Her on-going co-morbidities. Natural disease trajectory and expectations were discussed.  7/27: She becomes tearful expressing her disease progression over time. She reports her children are aware of her condition to an extent however she chooses not to "tell them" everything until she knows she is facing end-of-life. I created space and opportunity to allow her to express her thoughts and feelings. Kahley states she is having some anxiety to the point she feels as though it is hard to breath and some nervousness. This occurs as she thinks often about her cancer, not if but when will she have to face end-of-life, and how much longer will she make it. Emotional support provided.   Education provided on use of benzodiazepines short-term vs long-term. I discussed at length use of valium in addition to other non-pharmacological therapeutic methods. She verbalized understanding and appreciation.   We will continue  to closely monitor and support.    I discussed the importance of continued conversation with family and their medical providers regarding overall plan of care and treatment options, ensuring decisions are within the context of the patients values and GOCs.  PLAN: Oxy IR as needed for breakthrough pain Declines to restart or try different medication focused on long-acting pain relief due to potential symptoms.  Valium twice daily as needed for anxiety  Pregabalin 129m three times daily  Miralax daily for bowel regimen Ongoing goals of care discussions and support I will plan to see her back in the office in 3-4 weeks in collaboration with her other oncology appointments. Sooner if needed.    Patient expressed understanding and was in agreement with this plan. She also understands that She can call the clinic at any time with any questions, concerns, or complaints.    Any controlled substances utilized were prescribed in the context of palliative care. PDMP has been reviewed.    Time Total: 35 min   Visit consisted of counseling and education dealing with the complex and emotionally intense issues of symptom management and palliative care in the setting of serious and potentially life-threatening illness.Greater than 50%  of this time was spent counseling and coordinating care related to the above assessment and plan.  NAlda Lea AGPCNP-BC  Palliative Medicine Team/[place]

## 2022-07-26 DIAGNOSIS — E1149 Type 2 diabetes mellitus with other diabetic neurological complication: Secondary | ICD-10-CM | POA: Diagnosis not present

## 2022-07-28 ENCOUNTER — Inpatient Hospital Stay: Payer: Medicare Other | Admitting: Licensed Clinical Social Worker

## 2022-07-28 NOTE — Progress Notes (Signed)
[place] Work  Clinical Social Work was referred by  palliative care  for assessment of needs related to in-home aide.  Clinical Social Worker contacted patient by phone  to offer support and assess for needs.    Patient reports that she is trying to get aide services through [facility] since her daughter used to assist with ADLs but has moved out of state. Per pt, [facility] faxed paperwork and pt's daughter dropped off paperwork for [employer] for PCS services to Dr. Chryl Heck. CSW will follow-up to see if MD received paperwork. Pt also reports having Medicaid, but CSW does not see this on file. CSW asked pt to bring in Medicaid card at next visit as well.     [place], [place] Worker [employer]

## 2022-07-29 ENCOUNTER — Other Ambulatory Visit (HOSPITAL_COMMUNITY): Payer: Self-pay

## 2022-07-30 ENCOUNTER — Ambulatory Visit: Payer: Self-pay | Admitting: *Deleted

## 2022-07-30 ENCOUNTER — Encounter: Payer: Self-pay | Admitting: *Deleted

## 2022-07-30 NOTE — Patient Outreach (Signed)
Care Coordination   Initial Visit Note   07/30/2022  Name: [name] MRN: [id number] DOB: 1951/01/15  [name] is a 71 y.o. year old female who sees Chambliss, Jeb Levering, MD for primary care. I spoke with [name] by phone today.  What matters to the patients health and wellness today?  Assist with Completion of Personal Care Services Application through [facility].    Goals Addressed               This Visit's Progress     Assist with Completion of Application for Personal Care Services through [place]. (pt-stated)   On track     Care Coordination Interventions:  Assessed Social Determinant of Health Barriers. Discussed Plans for Ongoing Care Management Follow Up. Provided Tree surgeon Information for Care Management Team. Screened for Signs & Symptoms of Depression Related to Chronic Disease State.  RSW5/IOE7 Depression Screen Completed & Results Reviewed.  Suicidal Ideation/Homicidal Ideation Assessed - None Present.      Solution-Focused Strategies Developed. Active Listening/Reflection Utilized.  Emotional Support Provided. Verbalization of Feelings Encouraged.  Caregiver Stress Acknowledged. Caregiver Resources Provided. Caregiver Support Groups Offered & Self-Enrollment Encouraged. Provided Crisis Support Information, Agencies and Resources. Problem Solving Interventions Identified. Task-Centered Solutions Implemented.   Psychoeducation for Mental Health Concerns Initiated. Reviewed Mental Health Medications & Discussed Importance of Compliance. Quality of Sleep Assessed & Sleep Hygiene Techniques Promoted. Discussed Higher Level of Care Options ([place]). Verified No In-Home Care Services, [employer], Building control surveyor, Etc., Covered Under Current Conservation officer, nature [name]).  Reviewed Conservation officer, nature  Benefits with Patient to Ensure Understanding.   Verified Patient's Monthly Social Security Income Exceeds Poverty Guidelines for the [employer] [facility] [place], Making Patient Ineligible for [place]. Explained to Patient, If Ineligible for Medicaid, Will Not Qualify for [place], through [facility] [phone]). Verified No Long-Term Care Insurance Benefits, [employer], Plans, Etc.  Confirmed Patient Was Not a English as a second language teacher, Making Patient Ineligible for Aid & Attendance Benefits, Through [employer]. CSW Collaboration with Cherene Altes, Medicaid Case Worker with the [place] [phone]), to Inquire About Medicaid Status. ~ According to Cherene Altes, Medicaid Case Worker with the [place] (# [phone]), Medicaid Application is Still Pending. ~ Cherene Altes, Medicaid Case Worker with the [place] [phone]), Reported That She Is Trying to Get Patient Approved for Full Medicaid Benefits, Under New Medicaid Expansion Program Guidelines, Effective 07/18/2022. ~ Cherene Altes, Medicaid Case Worker with the [place] [phone]), Stated That She Requested All Outstanding Medical Expenses from [facility]. ~ Cherene Altes, Medicaid Case Worker with the [place] [phone]), Indicated That She Will Not Be Able to Process the New Medicaid Application, Until All Necessary Documentation is Submitted. CSW Environmental education officer from [facility], to [employer] of All Outstanding Medical Expenses to Cherene Altes, [place] Case Worker with the [place] [phone])         SDOH assessments and interventions completed:  Yes.  SDOH  Interventions Today    Flowsheet Row Most Recent Value  SDOH Interventions   Food Insecurity Interventions Intervention Not Indicated  Housing Interventions Intervention Not Indicated  Transportation Interventions Intervention Not Indicated, Patient Resources (Friends/Family), Payor Benefit  Utilities Interventions Intervention Not Indicated  Alcohol Usage Interventions Intervention Not Indicated (Score <7)  Financial Strain Interventions Development worker, community, Other (Comment)  [Assist with Determining Status of Medicaid Application.]  Physical Activity Interventions Patient Refused  Stress Interventions Intervention Not Indicated, Offered [employer] Resources  Social Connections Interventions Intervention Not Indicated     Care Coordination Interventions:  Yes, provided.   Follow up plan: Follow up call scheduled for 08/14/2022 at 12:30 pm.  Encounter Outcome:  Pt. Visit Completed.   Nat Christen, BSW, MSW, LCSW  Licensed Education officer, environmental Health System  [place]ailing [place] [address], [place], [place] [address] Physical Address-[address], [place], [place] [address] Toll Free Main # [phone] Fax # [phone] Cell # [phone] [email or web address]

## 2022-07-30 NOTE — Patient Instructions (Signed)
Visit Information  Thank you for taking time to visit with me today. Please don't hesitate to contact me if I can be of assistance to you.   Following are the goals we discussed today:   Goals Addressed               This Visit's Progress     Assist with Completion of Application for Personal Care Services through [place]. (pt-stated)   On track     Care Coordination Interventions:  Assessed Social Determinant of Health Barriers. Discussed Plans for Ongoing Care Management Follow Up. Provided Tree surgeon Information for Care Management Team. Screened for Signs & Symptoms of Depression Related to Chronic Disease State.  DGU4/QIH4 Depression Screen Completed & Results Reviewed.  Suicidal Ideation/Homicidal Ideation Assessed - None Present.      Solution-Focused Strategies Developed. Active Listening/Reflection Utilized.  Emotional Support Provided. Verbalization of Feelings Encouraged.  Caregiver Stress Acknowledged. Caregiver Resources Provided. Caregiver Support Groups Offered & Self-Enrollment Encouraged. Provided Crisis Support Information, Agencies and Resources. Problem Solving Interventions Identified. Task-Centered Solutions Implemented.   Psychoeducation for Mental Health Concerns Initiated. Reviewed Mental Health Medications & Discussed Importance of Compliance. Quality of Sleep Assessed & Sleep Hygiene Techniques Promoted. Discussed Higher Level of Care Options ([place]). Verified No In-Home Care Services, [employer], Building control surveyor, Etc., Covered Under Current Conservation officer, nature [employer]).  Reviewed Conservation officer, nature Benefits with Patient to Ensure Understanding.   Verified Patient's Monthly Social Security Income Exceeds Poverty Guidelines for the [employer] [facility] [place], Making Patient Ineligible for [place]. Explained to  Patient, If Ineligible for Medicaid, Will Not Qualify for [place], through [facility] [phone]). Verified No Long-Term Care Insurance Benefits, [employer], Plans, Etc.  Confirmed Patient Was Not a English as a second language teacher, Making Patient Ineligible for Aid & Attendance Benefits, Through [employer]. CSW Collaboration with Cherene Altes, Medicaid Case Worker with the [place] [phone]), to Inquire About Medicaid Status. ~ According to Cherene Altes, Medicaid Case Worker with the [place] (# [phone]), Medicaid Application is Still Pending. ~ Cherene Altes, Medicaid Case Worker with the [place] [phone]), Reported That She Is Trying to Get Patient Approved for Full Medicaid Benefits, Under New Medicaid Expansion Program Guidelines, Effective 07/18/2022. ~ Cherene Altes, Medicaid Case Worker with the [place] [phone]), Stated That She Requested All Outstanding Medical Expenses from [facility]. ~ Cherene Altes, Medicaid Case Worker with the [place] [phone]), Indicated That She Will Not Be Able to Process the New Medicaid Application, Until All Necessary Documentation is Submitted. CSW Environmental education officer from [facility], to [employer] of All Outstanding Medical Expenses to Cherene Altes, [place] Case Worker with the [place] [phone])       Our next appointment is by telephone on 08/14/2022 at 12:30 pm.  Please call the care guide team at [phone] if you need to cancel or reschedule your appointment.   If you are experiencing a Mental Health or [place] or need someone to talk to, please call the  Suicide and Crisis Lifeline: 988 call the [place] National Suicide Prevention Lifeline: 623-054-3470 or TTY: 360-421-0116 TTY (660)532-5080) to talk to a trained counselor call 1-800-273-TALK (toll free, 24 hour hotline) go to [employer]  [facility] [place] [phone]) call the [place]: [phone] call 911  Patient verbalizes understanding of instructions and care plan provided today and agrees to view in [place]. Active MyChart status and patient understanding of how to access instructions and care plan via MyChart confirmed with patient.     Telephone follow up appointment with care management team member scheduled for:  08/14/2022 at 12:30 pm.  Nat Christen, BSW, MSW, [place]  Licensed Clinical Social Worker  [place]  Mailing [place]. [address] [place], [place] [address] Physical Address-[address] [place],  [address] Toll Free Main # [phone] Fax # [phone] Cell # [phone] [email or web address]

## 2022-07-31 ENCOUNTER — Inpatient Hospital Stay: Payer: Medicare Other

## 2022-07-31 ENCOUNTER — Inpatient Hospital Stay (HOSPITAL_BASED_OUTPATIENT_CLINIC_OR_DEPARTMENT_OTHER): Payer: Medicare Other | Admitting: Hematology and Oncology

## 2022-07-31 ENCOUNTER — Ambulatory Visit (HOSPITAL_COMMUNITY)
Admission: RE | Admit: 2022-07-31 | Discharge: 2022-07-31 | Disposition: A | Discharge status: Home / Self Care | Payer: Medicare Other | Source: Ambulatory Visit | Attending: Hematology and Oncology | Admitting: Hematology and Oncology

## 2022-07-31 VITALS — BP 156/61 | HR 59 | Temp 97.8°F | Resp 16

## 2022-07-31 DIAGNOSIS — E119 Type 2 diabetes mellitus without complications: Secondary | ICD-10-CM | POA: Insufficient documentation

## 2022-07-31 DIAGNOSIS — Z170 Estrogen receptor positive status [ER+]: Secondary | ICD-10-CM

## 2022-07-31 DIAGNOSIS — I517 Cardiomegaly: Secondary | ICD-10-CM | POA: Diagnosis not present

## 2022-07-31 DIAGNOSIS — Z0189 Encounter for other specified special examinations: Secondary | ICD-10-CM | POA: Diagnosis not present

## 2022-07-31 DIAGNOSIS — C50212 Malignant neoplasm of upper-inner quadrant of left female breast: Secondary | ICD-10-CM

## 2022-07-31 DIAGNOSIS — Z86718 Personal history of other venous thrombosis and embolism: Secondary | ICD-10-CM | POA: Diagnosis not present

## 2022-07-31 DIAGNOSIS — Z95828 Presence of other vascular implants and grafts: Secondary | ICD-10-CM

## 2022-07-31 DIAGNOSIS — Z79811 Long term (current) use of aromatase inhibitors: Secondary | ICD-10-CM | POA: Diagnosis not present

## 2022-07-31 DIAGNOSIS — E669 Obesity, unspecified: Secondary | ICD-10-CM | POA: Insufficient documentation

## 2022-07-31 DIAGNOSIS — Z5112 Encounter for antineoplastic immunotherapy: Secondary | ICD-10-CM | POA: Diagnosis not present

## 2022-07-31 DIAGNOSIS — C50919 Malignant neoplasm of unspecified site of unspecified female breast: Secondary | ICD-10-CM

## 2022-07-31 DIAGNOSIS — Z171 Estrogen receptor negative status [ER-]: Secondary | ICD-10-CM | POA: Diagnosis not present

## 2022-07-31 DIAGNOSIS — Z87891 Personal history of nicotine dependence: Secondary | ICD-10-CM | POA: Diagnosis not present

## 2022-07-31 DIAGNOSIS — I10 Essential (primary) hypertension: Secondary | ICD-10-CM | POA: Diagnosis not present

## 2022-07-31 LAB — CBC WITH DIFFERENTIAL (CANCER CENTER ONLY)
Abs Immature Granulocytes: 0.02 10*3/uL (ref 0.00–0.07)
Basophils Absolute: 0.1 10*3/uL (ref 0.0–0.1)
Basophils Relative: 1 %
Eosinophils Absolute: 0.1 10*3/uL (ref 0.0–0.5)
Eosinophils Relative: 2 %
HCT: 41.5 % (ref 36.0–46.0)
Hemoglobin: 13.8 g/dL (ref 12.0–15.0)
Immature Granulocytes: 0 %
Lymphocytes Relative: 26 %
Lymphs Abs: 2.3 10*3/uL (ref 0.7–4.0)
MCH: 29.4 pg (ref 26.0–34.0)
MCHC: 33.3 g/dL (ref 30.0–36.0)
MCV: 88.3 fL (ref 80.0–100.0)
Monocytes Absolute: 0.6 10*3/uL (ref 0.1–1.0)
Monocytes Relative: 7 %
Neutro Abs: 6 10*3/uL (ref 1.7–7.7)
Neutrophils Relative %: 64 %
Platelet Count: 244 10*3/uL (ref 150–400)
RBC: 4.7 MIL/uL (ref 3.87–5.11)
RDW: 14.1 % (ref 11.5–15.5)
WBC Count: 9.1 10*3/uL (ref 4.0–10.5)
nRBC: 0 % (ref 0.0–0.2)

## 2022-07-31 LAB — CMP (CANCER CENTER ONLY)
ALT: 59 U/L — ABNORMAL HIGH (ref 0–44)
AST: 65 U/L — ABNORMAL HIGH (ref 15–41)
Albumin: 3.8 g/dL (ref 3.5–5.0)
Alkaline Phosphatase: 89 U/L (ref 38–126)
Anion gap: 7 (ref 5–15)
BUN: 14 mg/dL (ref 8–23)
CO2: 31 mmol/L (ref 22–32)
Calcium: 9.6 mg/dL (ref 8.9–10.3)
Chloride: 102 mmol/L (ref 98–111)
Creatinine: 0.77 mg/dL (ref 0.44–1.00)
GFR, Estimated: >60 mL/min (ref >60)
Glucose, Bld: 125 mg/dL — ABNORMAL HIGH (ref 70–99)
Potassium: 4 mmol/L (ref 3.5–5.1)
Sodium: 140 mmol/L (ref 135–145)
Total Bilirubin: 0.7 mg/dL (ref 0.3–1.2)
Total Protein: 7.2 g/dL (ref 6.5–8.1)

## 2022-07-31 LAB — ECHOCARDIOGRAM COMPLETE
AR max vel: 2.21 cm2
AV Area VTI: 2.19 cm2
AV Area mean vel: 2.13 cm2
AV Mean grad: 3 mmHg
AV Peak grad: 6.2 mmHg
Ao pk vel: 1.24 m/s
Area-P 1/2: 3.08 cm2
S' Lateral: 2.7 cm

## 2022-07-31 MED ORDER — HEPARIN SOD (PORK) LOCK FLUSH 100 UNIT/ML IV SOLN
500.0000 [IU] | Freq: Once | INTRAVENOUS | Status: AC | PRN
  Administered 2022-07-31: 500 [IU]

## 2022-07-31 MED ORDER — SODIUM CHLORIDE 0.9% FLUSH
10.0000 mL | Freq: Once | INTRAVENOUS | Status: AC
  Administered 2022-07-31: 10 mL via INTRAVENOUS

## 2022-07-31 MED ORDER — ACETAMINOPHEN 325 MG PO TABS
650.0000 mg | ORAL_TABLET | Freq: Once | ORAL | Status: AC
  Administered 2022-07-31: 650 mg via ORAL
  Filled 2022-07-31: qty 2

## 2022-07-31 MED ORDER — SODIUM CHLORIDE 0.9 % IV SOLN: Freq: Once | INTRAVENOUS | Status: AC

## 2022-07-31 MED ORDER — LORAZEPAM 2 MG/ML IJ SOLN
0.5000 mg | Freq: Once | INTRAMUSCULAR | Status: AC
  Administered 2022-07-31: 0.5 mg via INTRAVENOUS
  Filled 2022-07-31: qty 1

## 2022-07-31 MED ORDER — PERFLUTREN LIPID MICROSPHERE
1.0000 mL | INTRAVENOUS | Status: AC | PRN
  Administered 2022-07-31: 2 mL via INTRAVENOUS

## 2022-07-31 MED ORDER — TRASTUZUMAB-ANNS CHEMO 150 MG IV SOLR
750.0000 mg | Freq: Once | INTRAVENOUS | Status: AC
  Administered 2022-07-31: 750 mg via INTRAVENOUS
  Filled 2022-07-31: qty 35.72

## 2022-07-31 MED ORDER — SODIUM CHLORIDE 0.9% FLUSH
10.0000 mL | Freq: Once | INTRAVENOUS | Status: AC
  Administered 2022-07-31: 10 mL

## 2022-07-31 MED ORDER — DIPHENHYDRAMINE HCL 25 MG PO CAPS
25.0000 mg | ORAL_CAPSULE | Freq: Once | ORAL | Status: AC
  Administered 2022-07-31: 25 mg via ORAL
  Filled 2022-07-31: qty 1

## 2022-07-31 NOTE — Progress Notes (Signed)
Ok to use echo from June per Dr Chryl Heck

## 2022-07-31 NOTE — Progress Notes (Signed)
  Echocardiogram 2D Echocardiogram has been performed.  [name] 07/31/2022, 2:36 PM

## 2022-07-31 NOTE — Patient Instructions (Signed)
[facility] ONCOLOGY  Discharge Instructions: Thank you for choosing [facility] to provide your oncology and hematology care.   If you have a lab appointment with the [facility], please go directly to the [facility] and check in at the registration area.   Wear comfortable clothing and clothing appropriate for easy access to any Portacath or PICC line.   We strive to give you quality time with your provider. You may need to reschedule your appointment if you arrive late (15 or more minutes).  Arriving late affects you and other patients whose appointments are after yours.  Also, if you miss three or more appointments without notifying the office, you may be dismissed from the clinic at the provider's discretion.      For prescription refill requests, have your pharmacy contact our office and allow 72 hours for refills to be completed.    Today you received the following chemotherapy and/or immunotherapy agents: Trastuzumab      To help prevent nausea and vomiting after your treatment, we encourage you to take your nausea medication as directed.  BELOW ARE SYMPTOMS THAT SHOULD BE REPORTED IMMEDIATELY: *FEVER GREATER THAN 100.4 F (38 C) OR HIGHER *CHILLS OR SWEATING *NAUSEA AND VOMITING THAT IS NOT CONTROLLED WITH YOUR NAUSEA MEDICATION *UNUSUAL SHORTNESS OF BREATH *UNUSUAL BRUISING OR BLEEDING *URINARY PROBLEMS (pain or burning when urinating, or frequent urination) *BOWEL PROBLEMS (unusual diarrhea, constipation, pain near the anus) TENDERNESS IN MOUTH AND THROAT WITH OR WITHOUT PRESENCE OF ULCERS (sore throat, sores in mouth, or a toothache) UNUSUAL RASH, SWELLING OR PAIN  UNUSUAL VAGINAL DISCHARGE OR ITCHING   Items with * indicate a potential emergency and should be followed up as soon as possible or go to the Emergency Department if any problems should occur.  Please show the CHEMOTHERAPY ALERT CARD or IMMUNOTHERAPY ALERT CARD at check-in  to the Emergency Department and triage nurse.  Should you have questions after your visit or need to cancel or reschedule your appointment, please contact [facility]  Dept: [phone]  and follow the prompts.  Office hours are 8:00 a.m. to 4:30 p.m. Monday - Friday. Please note that voicemails left after 4:00 p.m. may not be returned until the following business day.  We are closed weekends and major holidays. You have access to a nurse at all times for urgent questions. Please call the main number to the clinic Dept: [phone] and follow the prompts.   For any non-urgent questions, you may also contact your provider using MyChart. We now offer e-Visits for anyone 7 and older to request care online for non-urgent symptoms. For details visit [email or web address].   Also download the MyChart app! Go to the app store, search "MyChart", open the app, select [facility], and log in with your MyChart username and password.  Masks are optional in the cancer centers. If you would like for your care team to wear a mask while they are taking care of you, please let them know. You may have one support person who is at least 72 years old accompany you for your appointments.
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
## 2022-08-02 ENCOUNTER — Encounter: Payer: Self-pay | Admitting: Hematology and Oncology

## 2022-08-04 ENCOUNTER — Other Ambulatory Visit: Payer: Self-pay | Admitting: Nurse Practitioner

## 2022-08-04 ENCOUNTER — Telehealth: Payer: Self-pay | Admitting: *Deleted

## 2022-08-04 NOTE — Telephone Encounter (Addendum)
Contacted patient with Dr. Rob Hickman message below. Patient verbalized understaqnding. She asked that Dr. Erin Hearing (PCP) and  Dr. Valentina Lucks be sent a message about the results. Both providers are within [facility] - staff message sent to inform that patient had labs completed 07/31/22.   ----- Message from Benay Pike, MD sent at 08/02/2022 12:23 PM EST ----- Liver enzymes are slightly elevated.  This is not related to the Herceptin.  She is on some medications for weight loss and diabetes.  She may want to talk to her PCP or endocrinologist regarding this.

## 2022-08-07 ENCOUNTER — Telehealth: Payer: Self-pay

## 2022-08-07 NOTE — Telephone Encounter (Signed)
Spoke with her feeling ok She does not drink alcohol or take tylenol  Discussed that her LFTs have been elevated mildly intermittently for years I do not think this is semaglutide.  In some studies of fatty liver it actually decreases LFTs and might with her if she can lose weight   We should continue to monitor monthly   She agrees

## 2022-08-07 NOTE — Telephone Encounter (Signed)
Patient calls nurse line requesting to speak to PCP in regards to [place].   She reports elevated liver enzymes and was told this was not related to Herceptin. She reports she was told this could be a side effect of Ozempic per Dr. Chryl Heck.   She reports a message was sent to PCP and [place] in regards to this.   Patient would like to discuss continuing this medication or not.   Will forward to PCP.

## 2022-08-09 DIAGNOSIS — R062 Wheezing: Secondary | ICD-10-CM | POA: Diagnosis not present

## 2022-08-09 DIAGNOSIS — M25569 Pain in unspecified knee: Secondary | ICD-10-CM | POA: Diagnosis not present

## 2022-08-09 DIAGNOSIS — U071 COVID-19: Secondary | ICD-10-CM | POA: Diagnosis not present

## 2022-08-09 DIAGNOSIS — I5020 Unspecified systolic (congestive) heart failure: Secondary | ICD-10-CM | POA: Diagnosis not present

## 2022-08-10 DIAGNOSIS — U071 COVID-19: Secondary | ICD-10-CM | POA: Diagnosis not present

## 2022-08-10 DIAGNOSIS — R062 Wheezing: Secondary | ICD-10-CM | POA: Diagnosis not present

## 2022-08-10 DIAGNOSIS — I5020 Unspecified systolic (congestive) heart failure: Secondary | ICD-10-CM | POA: Diagnosis not present

## 2022-08-10 DIAGNOSIS — M25569 Pain in unspecified knee: Secondary | ICD-10-CM | POA: Diagnosis not present

## 2022-08-14 ENCOUNTER — Ambulatory Visit: Payer: Self-pay | Admitting: *Deleted

## 2022-08-14 ENCOUNTER — Encounter: Payer: Self-pay | Admitting: *Deleted

## 2022-08-14 NOTE — Patient Instructions (Signed)
Visit Information  Thank you for taking time to visit with me today. Please don't hesitate to contact me if I can be of assistance to you.   Following are the goals we discussed today:   Goals Addressed               This Visit's Progress     Assist with Completion of Application for Personal Care Services through [facility]. (pt-stated)   On track     Care Coordination Interventions:  Solution-Focused Strategies Employed. Problem Solving Interventions Activated. Task-Centered Solutions Implemented.   Active Listening/Reflection Utilized.  Verbalization of Feelings Encouraged.  Emotional & Caregiver Support Provided. Caregiver Resources Reviewed. Self-Enrollment in Caregiver Support Group Emphasized. Continue to Receive Simplified Nutritional Assistance Program Services (SNAP), through [facility] [phone]). CSW Collaboration with Cherene Altes, Medicaid Case Worker with the [place] [phone]), to Inquire About Medicaid Status. ~ Cherene Altes, Medicaid Case Worker with the [place] [phone]), Reported Medicaid Application is Still Pending, As of 08/14/2022. ~ Cherene Altes, Medicaid Case Worker with the [place] [phone]), Reported Plan to Try & Get Patient Approved for Full Medicaid Benefits, Under New Medicaid Expansion Program Guidelines, Effective 07/18/2022. ~ Cherene Altes, Medicaid Case Worker with the [place] [phone]), Confirmed All Outstanding Medical Expenses Have Been Received from [facility]. Please Review the Following Set of Instructions & Application, Emailed on 08/14/2022:   ~ [place] Instructions   ~ [place] Application   ~ [place] Provider  List Please Complete Application for Personal Care Services and Submit to Primary Care Provider, Dr. Talbert Cage, for Review & Signature.         Our next appointment is by telephone on 08/28/2022 at 10:30 am.  Please call the care guide team at [phone] if you need to cancel or reschedule your appointment.   If you are experiencing a Mental Health or [place] or need someone to talk to, please call the Suicide and Crisis Lifeline: 988 call the [place] National Suicide Prevention Lifeline: (719) 019-0003 or TTY: (737) 810-6275 TTY 416-129-7896) to talk to a trained counselor call 1-800-273-TALK (toll free, 24 hour hotline) go to [facility] [address], [place] [phone]) call the [place]: [phone] call 911  Patient verbalizes understanding of instructions and care plan provided today and agrees to view in [place]. Active MyChart status and patient understanding of how to access instructions and care plan via MyChart confirmed with patient.     Telephone follow up appointment with care management team member scheduled for:  08/28/2022 at 10:30 am.    Nat Christen, BSW, MSW, [place]  Licensed Clinical Social Worker  [place]  Mailing [place]. [address] [place], [place] [address] Physical Address[address] [place], [place] [address] Toll Free Main # [phone] Fax # [phone] Cell # [phone] [email or web address]

## 2022-08-14 NOTE — Patient Outreach (Signed)
  Care Coordination   Follow Up Visit Note   08/14/2022  Name: [name] MRN: [id number] DOB: [birth year]  [name] is a 71 y.o. year old female who sees Chambliss, Jeb Levering, MD for primary care. I spoke with [name] by phone today.  What matters to the patients health and wellness today?  Assist with Completion of Personal Care Services Application through [facility].    Goals Addressed               This Visit's Progress     Assist with Completion of Application for Personal Care Services through [facility]. (pt-stated)   On track     Care Coordination Interventions:  Solution-Focused Strategies Employed. Problem Solving Interventions Activated. Task-Centered Solutions Implemented.   Active Listening/Reflection Utilized.  Verbalization of Feelings Encouraged.  Emotional & Caregiver Support Provided. Caregiver Resources Reviewed. Self-Enrollment in Caregiver Support Group Emphasized. Continue to Receive Simplified Nutritional Assistance Program Services (SNAP), through The [place] [phone]). CSW Collaboration with Cherene Altes, Medicaid Case Worker with the [place] [phone]), to Inquire About Medicaid Status. ~ Cherene Altes, Medicaid Case Worker with the [place] [phone]), Reported Medicaid Application is Still Pending, As of 08/14/2022. ~ Cherene Altes, Medicaid Case Worker with the [place] [phone]), Reported Plan to Try & Get Patient Approved for Full Medicaid Benefits, Under New Medicaid Expansion Program Guidelines, Effective 07/18/2022. ~ Cherene Altes, Medicaid Case Worker with the [place] [phone]), Confirmed All Outstanding Medical Expenses Have Been Received from The [facility]. Please Review the Following Set of Instructions & Application, Emailed on 08/14/2022:   ~ [place] Instructions   ~ [place] Application   ~ [place] Provider List Please Complete Application for Personal Care Services and Submit to Primary Care Provider, Dr. Talbert Cage, for Review & Signature.         SDOH assessments and interventions completed:  Yes.  Care Coordination Interventions:  Yes, provided.   Follow up plan: Follow up call scheduled for 08/28/2022 at 10:30 am.  Encounter Outcome:  Pt. Visit Completed.   Nat Christen, BSW, MSW, LCSW  Licensed Education officer, [facility]  Mailing [place] [address] [place], [place] [address] Physical Address-[address], [place], [place] [address] Toll Free Main # [phone] Fax # [phone] Cell # [phone] [email or web address]

## 2022-08-20 ENCOUNTER — Other Ambulatory Visit: Payer: Self-pay

## 2022-08-20 MED ORDER — OXYCODONE HCL 5 MG PO TABS: 5.0000 mg | ORAL_TABLET | Freq: Four times a day (QID) | ORAL | 0 refills | Status: DC | PRN

## 2022-08-20 MED ORDER — DIAZEPAM 5 MG PO TABS: 5.0000 mg | ORAL_TABLET | Freq: Two times a day (BID) | ORAL | 0 refills | Status: DC | PRN

## 2022-08-20 NOTE — Telephone Encounter (Signed)
Pt called for refill ,see new orders.

## 2022-08-21 ENCOUNTER — Telehealth (INDEPENDENT_AMBULATORY_CARE_PROVIDER_SITE_OTHER): Payer: Medicare Other | Admitting: Pharmacist

## 2022-08-21 DIAGNOSIS — E1149 Type 2 diabetes mellitus with other diabetic neurological complication: Secondary | ICD-10-CM | POA: Diagnosis not present

## 2022-08-21 NOTE — Patient Instructions (Addendum)
-  Decreased dose of basal insulin Lantus (insulin glargine) from 20 units to 18 units daily.  -Decreased dose of rapid insulin Humalog (insulin lispro) from 10 units TID to 10 units BID.  -Continued GLP-1 Ozempic (semaglutide) 2 mg weekly. Denies GI side effects.

## 2022-08-21 NOTE — Progress Notes (Signed)
S:   Visit was attempted via Video visit due to patient transportation limitations.  Unfortunately, patient was not able to connect via computer (video) and the visit was conducted entirely by phone. Patient was located at home.  Visit was conducted from [place].  Chief Complaint  Patient presents with   Medication Management    Diabetes - CGM    72 y.o. female who presents for diabetes evaluation, education, and management.  PMH is significant for breast cancer, hypertension, anxiety, and obesity.  Patient last seen in Pharmacy clinic on 05/15/22.  Patient was last seen by Primary Care Provider, Dr. Erin Hearing, on 04/08/22, last seen by Rx team 07/03/2022.  At last visit with pharmacy, Lantus (insulin glargine) was changed from 30 to 24 units daily and Humalog (insuline lispro) was reduced from 14 units to 10 units TID.   Today, patient presents in good spirits. Reports that diarrhea and nausea have improved after increasing Ozempic (semaglutide) to 2 mg. Patient also endorses a reduced appetite. She has lost ~20 lbs since starting Ozempic (current weight 303 lbs, baseline 325 lbs). She also endorses she has been snacking to raise BG when her sugar is the 80s.   Current diabetes medications include: Lantus (insulin glargine) 20 units once daily, Humalog (insulin lispro) 10 units TID AC, and Ozempic (semaglutide) 2 mg weekly (Tuesdays)   Patient reports adherence to taking all medications as prescribed.   Insurance coverage: [facility] Medicare  Patient reports hypoglycemic events. Occasionally dipping into the 80s which she corrects for with food.   Patient reported dietary habits: Eats 2 meals/day, eating more salads.   Patient-reported exercise habits: weight is down to 303 lbs, baseline 325 lbs. When she takes her pain medicine she is more mobile. She is able to walk ~90 steps.    O:  Unable to download CGM data due to lack of computer access today.  Verbal  self-reported blood glucose values 7 day average: 131 mg/dL  14 day average: 133 mg/dL 30 day average: 128 mg/dL 90 day average: 143 mg/dL    Lab Results  Component Value Date   HGBA1C 6.3 04/08/2022    Lipid Panel     Component Value Date/Time   CHOL 150 11/08/2019 1344   TRIG 107 04/30/2020 1626   HDL 66 11/08/2019 1344   CHOLHDL 2.3 11/08/2019 1344   CHOLHDL 2.5 10/27/2012 1653   VLDL 30 10/27/2012 1653   LDLCALC 60 11/08/2019 1344    Clinical Atherosclerotic Cardiovascular Disease (ASCVD): No  The 10-year ASCVD risk score (Arnett DK, et al., 2019) is: 27.9%   Values used to calculate the score:     Age: 45 years     Sex: Female     Is Non-Hispanic African American: No     Diabetic: Yes     Tobacco smoker: No     Systolic Blood Pressure: 419 mmHg     Is BP treated: No     HDL Cholesterol: 66 mg/dL     Total Cholesterol: 150 mg/dL   A/P: Diabetes longstanding, currently at goal based on CGM data. Weight loss reported. Patient is able to verbalize appropriate hypoglycemia management plan. Medication adherence appears appropriate. Occasionally eating to increase or maintain glucose in normal range.  -Decreased dose of basal insulin Lantus (insulin glargine) from 20 units to 18 units daily.  -Decreased dose of rapid insulin Humalog (insulin lispro) from 10 units TID to 10 units BID.  -Continued GLP-1 Ozempic (semaglutide) 2 mg  weekly. Denies GI side effects.  -Patient educated on purpose, proper use, and potential adverse effects of insulin and Ozempic.  -Extensively discussed pathophysiology of diabetes, recommended lifestyle interventions, dietary effects on blood sugar control.  -Counseled on s/sx of and management of hypoglycemia.  -Next A1c anticipated at next PCP visit.   Written patient instructions provided. Patient verbalized understanding of treatment plan.  Total time in telephone contact/counseling 23 minutes.    Follow-up:  Pharmacist PRN. PCP clinic  visit planned in March/April 2024.  Patient to schedule Patient seen with Dixon Boos,  PharmD Candidate and Joseph Art, PharmD, PGY2 Pharmacy Resident.

## 2022-08-21 NOTE — Assessment & Plan Note (Signed)
Diabetes longstanding, currently at goal based on CGM data. Weight loss reported. Patient is able to verbalize appropriate hypoglycemia management plan. Medication adherence appears appropriate. Occasionally eating to increase or maintain glucose in normal range.  -Decreased dose of basal insulin Lantus (insulin glargine) from 20 units to 18 units daily.  -Decreased dose of rapid insulin Humalog (insulin lispro) from 10 units TID to 10 units BID.  -Continued GLP-1 Ozempic (semaglutide) 2 mg weekly. Denies GI side effects.  -Patient educated on purpose, proper use, and potential adverse effects of insulin and Ozempic.  -Extensively discussed pathophysiology of diabetes, recommended lifestyle interventions, dietary effects on blood sugar control.  -Counseled on s/sx of and management of hypoglycemia.  -Next A1c anticipated at next PCP visit.

## 2022-08-22 ENCOUNTER — Other Ambulatory Visit: Payer: Self-pay | Admitting: Hematology and Oncology

## 2022-08-25 ENCOUNTER — Other Ambulatory Visit (HOSPITAL_COMMUNITY): Payer: Self-pay

## 2022-08-25 NOTE — Progress Notes (Signed)
Reviewed: I agree with Dr. Koval's documentation and management. 

## 2022-08-26 DIAGNOSIS — E1149 Type 2 diabetes mellitus with other diabetic neurological complication: Secondary | ICD-10-CM | POA: Diagnosis not present

## 2022-08-28 ENCOUNTER — Inpatient Hospital Stay (HOSPITAL_BASED_OUTPATIENT_CLINIC_OR_DEPARTMENT_OTHER): Payer: 59 | Admitting: Nurse Practitioner

## 2022-08-28 ENCOUNTER — Ambulatory Visit: Payer: Self-pay | Admitting: *Deleted

## 2022-08-28 ENCOUNTER — Encounter: Payer: Self-pay | Admitting: Nurse Practitioner

## 2022-08-28 ENCOUNTER — Encounter: Payer: Self-pay | Admitting: *Deleted

## 2022-08-28 ENCOUNTER — Inpatient Hospital Stay: Payer: 59 | Attending: Oncology

## 2022-08-28 ENCOUNTER — Inpatient Hospital Stay: Payer: 59

## 2022-08-28 VITALS — BP 170/72 | HR 67 | Temp 98.8°F | Resp 14 | Wt 304.4 lb

## 2022-08-28 DIAGNOSIS — G893 Neoplasm related pain (acute) (chronic): Secondary | ICD-10-CM | POA: Diagnosis not present

## 2022-08-28 DIAGNOSIS — Z170 Estrogen receptor positive status [ER+]: Secondary | ICD-10-CM | POA: Insufficient documentation

## 2022-08-28 DIAGNOSIS — F419 Anxiety disorder, unspecified: Secondary | ICD-10-CM

## 2022-08-28 DIAGNOSIS — C50212 Malignant neoplasm of upper-inner quadrant of left female breast: Secondary | ICD-10-CM | POA: Insufficient documentation

## 2022-08-28 DIAGNOSIS — Z95828 Presence of other vascular implants and grafts: Secondary | ICD-10-CM

## 2022-08-28 DIAGNOSIS — Z515 Encounter for palliative care: Secondary | ICD-10-CM

## 2022-08-28 DIAGNOSIS — R110 Nausea: Secondary | ICD-10-CM

## 2022-08-28 DIAGNOSIS — Z5112 Encounter for antineoplastic immunotherapy: Secondary | ICD-10-CM | POA: Insufficient documentation

## 2022-08-28 DIAGNOSIS — C50919 Malignant neoplasm of unspecified site of unspecified female breast: Secondary | ICD-10-CM

## 2022-08-28 LAB — CMP (CANCER CENTER ONLY)
ALT: 61 U/L — ABNORMAL HIGH (ref 0–44)
AST: 46 U/L — ABNORMAL HIGH (ref 15–41)
Albumin: 3.8 g/dL (ref 3.5–5.0)
Alkaline Phosphatase: 71 U/L (ref 38–126)
Anion gap: 5 (ref 5–15)
BUN: 15 mg/dL (ref 8–23)
CO2: 31 mmol/L (ref 22–32)
Calcium: 9.4 mg/dL (ref 8.9–10.3)
Chloride: 104 mmol/L (ref 98–111)
Creatinine: 0.63 mg/dL (ref 0.44–1.00)
GFR, Estimated: >60 mL/min (ref >60)
Glucose, Bld: 104 mg/dL — ABNORMAL HIGH (ref 70–99)
Potassium: 4.2 mmol/L (ref 3.5–5.1)
Sodium: 140 mmol/L (ref 135–145)
Total Bilirubin: 0.5 mg/dL (ref 0.3–1.2)
Total Protein: 7.5 g/dL (ref 6.5–8.1)

## 2022-08-28 LAB — CBC WITH DIFFERENTIAL (CANCER CENTER ONLY)
Abs Immature Granulocytes: 0.02 10*3/uL (ref 0.00–0.07)
Basophils Absolute: 0 10*3/uL (ref 0.0–0.1)
Basophils Relative: 0 %
Eosinophils Absolute: 0.1 10*3/uL (ref 0.0–0.5)
Eosinophils Relative: 2 %
HCT: 40.6 % (ref 36.0–46.0)
Hemoglobin: 13.6 g/dL (ref 12.0–15.0)
Immature Granulocytes: 0 %
Lymphocytes Relative: 33 %
Lymphs Abs: 2.3 10*3/uL (ref 0.7–4.0)
MCH: 29.4 pg (ref 26.0–34.0)
MCHC: 33.5 g/dL (ref 30.0–36.0)
MCV: 87.7 fL (ref 80.0–100.0)
Monocytes Absolute: 0.5 10*3/uL (ref 0.1–1.0)
Monocytes Relative: 7 %
Neutro Abs: 4.1 10*3/uL (ref 1.7–7.7)
Neutrophils Relative %: 58 %
Platelet Count: 250 10*3/uL (ref 150–400)
RBC: 4.63 MIL/uL (ref 3.87–5.11)
RDW: 14.1 % (ref 11.5–15.5)
WBC Count: 7 10*3/uL (ref 4.0–10.5)
nRBC: 0 % (ref 0.0–0.2)

## 2022-08-28 MED ORDER — SODIUM CHLORIDE 0.9% FLUSH
10.0000 mL | Freq: Once | INTRAVENOUS | Status: AC
  Administered 2022-08-28: 10 mL

## 2022-08-28 MED ORDER — HEPARIN SOD (PORK) LOCK FLUSH 100 UNIT/ML IV SOLN
500.0000 [IU] | Freq: Once | INTRAVENOUS | Status: AC | PRN
  Administered 2022-08-28: 500 [IU]

## 2022-08-28 MED ORDER — SODIUM CHLORIDE 0.9 % IV SOLN: Freq: Once | INTRAVENOUS | Status: AC

## 2022-08-28 MED ORDER — ACETAMINOPHEN 325 MG PO TABS
650.0000 mg | ORAL_TABLET | Freq: Once | ORAL | Status: AC
  Administered 2022-08-28: 650 mg via ORAL
  Filled 2022-08-28: qty 2

## 2022-08-28 MED ORDER — LORAZEPAM 2 MG/ML IJ SOLN
0.5000 mg | Freq: Once | INTRAMUSCULAR | Status: AC
  Administered 2022-08-28: 0.5 mg via INTRAVENOUS
  Filled 2022-08-28: qty 1

## 2022-08-28 MED ORDER — SODIUM CHLORIDE 0.9% FLUSH
10.0000 mL | Freq: Once | INTRAVENOUS | Status: AC
  Administered 2022-08-28: 10 mL via INTRAVENOUS

## 2022-08-28 MED ORDER — DIPHENHYDRAMINE HCL 25 MG PO CAPS
25.0000 mg | ORAL_CAPSULE | Freq: Once | ORAL | Status: AC
  Administered 2022-08-28: 25 mg via ORAL
  Filled 2022-08-28: qty 1

## 2022-08-28 MED ORDER — TRASTUZUMAB-ANNS CHEMO 150 MG IV SOLR
750.0000 mg | Freq: Once | INTRAVENOUS | Status: AC
  Administered 2022-08-28: 750 mg via INTRAVENOUS
  Filled 2022-08-28: qty 35.72

## 2022-08-28 NOTE — Progress Notes (Signed)
Palliative Medicine [facility]  [phone] Fax:([phone]) [phone]   Name: [name] Date: 08/28/2022 MRN: [id number]  DOB: March 18, 1951  Patient Care Team: [name], MD as PCP - General (Family Medicine) Laurey Morale, MD as Consulting Physician (Cardiology) Rachel Moulds, MD as Consulting Physician (Hematology and Oncology) Saporito, Fanny Dance, LCSW as Social Worker (Licensed Clinical Social Worker)     INTERVAL HISTORY: [name] is a 72 y.o. female with  medical history including metastatic breast cancer with numerous pulmonary nodules bilaterally s/p chemotherapy currently on herceptin for maintenance. Now with complaints of uncontrolled back pain.  Palliative ask to see for symptom management.   SOCIAL HISTORY:     reports that she quit smoking about 54 years ago. Her smoking use included cigarettes. She has a 15.00 pack-year smoking history. She has been exposed to tobacco smoke. She has never used smokeless tobacco. She reports that she does not drink alcohol and does not use drugs.  ADVANCE DIRECTIVES:    CODE STATUS:   PAST MEDICAL HISTORY: Past Medical History:  Diagnosis Date   Arthritis    Back pain    Breast cancer (HCC) dx'd 11/2012   left   Chest pain    COPD (chronic obstructive pulmonary disease) (HCC)    Diabetes mellitus without complication (HCC) 09/28/2014   Ear pain    Fatty liver 6/03   Hypertension    Lung disease    Lung metastases dx'd 11/2012   Obesity, unspecified    Other abnormal glucose    Suicide attempt (HCC) 1996   Syncope and collapse    Unspecified sleep apnea     ALLERGIES:  is allergic to meperidine hcl, penicillins, and aspirin.  MEDICATIONS:  Current Outpatient Medications  Medication Sig Dispense Refill   albuterol (VENTOLIN HFA) 108 (90 Base) MCG/ACT inhaler INHALE 2 PUFFS INTO THE LUNGS EVERY 6 HOURS AS NEEDED FOR WHEEZE 8.5 each 1   Blood Glucose Monitoring Suppl (ONE  TOUCH ULTRA 2) w/Device KIT 1 kit by Does not apply route QID. ICD 10-code: E11.49. 1 kit 0   diazepam (VALIUM) 5 MG tablet Take 1 tablet (5 mg total) by mouth every 12 (twelve) hours as needed for anxiety. 30 tablet 0   glucose blood (ONETOUCH ULTRA) test strip CHECK BLOOD SUGAR ONCE A DAY 100 strip 2   insulin glargine (LANTUS SOLOSTAR) 100 UNIT/ML Solostar Pen Inject 24 Units into the skin daily. (Patient taking differently: Inject 20 Units into the skin daily.) 15 mL 3   insulin lispro (HUMALOG KWIKPEN) 100 UNIT/ML KwikPen Inject 10 u subq three times a day with meals 15 mL 3   Insulin Pen Needle (PEN NEEDLES) 30G X 8 MM MISC Please use to inject insulin 4 times daily. 200 each 3   ketoconazole (NIZORAL) 2 % cream Apply 1 application. topically daily. (Patient not taking: Reported on 06/05/2022) 15 g 0   letrozole (FEMARA) 2.5 MG tablet Take 1 tablet (2.5 mg total) by mouth daily. 90 tablet 12   loperamide (IMODIUM) 2 MG capsule TAKE 1 CAPSULE (2 MG TOTAL) BY MOUTH AS NEEDED FOR DIARRHEA OR LOOSE STOOLS. 30 capsule 1   ondansetron (ZOFRAN) 8 MG tablet TAKE 1 TABLET BY MOUTH EVERY 8 HOURS AS NEEDED FOR NAUSEA OR VOMITING. 20 tablet 0   oxyCODONE (OXY IR/ROXICODONE) 5 MG immediate release tablet Take 1 tablet (5 mg total) by mouth every 6 (six) hours as needed for severe pain. 90 tablet  0   OXYGEN Inhale into the lungs.     Semaglutide, 2 MG/DOSE, (OZEMPIC, 2 MG/DOSE,) 8 MG/3ML SOPN Inject 2 mg into the skin once a week. 3 mL 6   No current facility-administered medications for this visit.    VITAL SIGNS: There were no vitals taken for this visit. There were no vitals filed for this visit.  Estimated body mass index is 53.55 kg/m as calculated from the following:   Height as of 04/10/22: 5\' 4"  (1.626 m).   Weight as of 07/03/22: 312 lb (141.5 kg).   PERFORMANCE STATUS (ECOG) : 1 - Symptomatic but completely ambulatory  Assessment NAD, in wheelchair Normal breathing pattern AAO  x3  IMPRESSION: [name] presents to clinic today for follow-up. No acute distress noted. Shares she is still in process of getting her medicaid and hopefully CNA support. Her daughter is in the home and offers some support.   Denies constipation or diarrhea. She endorses occasional nausea however controlled with medication.   Neoplasm/Chronic/Neuropathic pain Ms. Luttrull's pain is well controlled on oxycodone.   We discussed her use of Oxy IR as needed for pain. She is tolerating well. Taking medication as prescribed.   We will continue to closely monitor and adjust medications accordingly.    PLAN: Oxy IR as needed for breakthrough pain Declines to restart or try different medication focused on long-acting pain relief due to potential symptoms.  Valium twice daily as needed for anxiety   Miralax daily for bowel regimen Ongoing goals of care discussions and support I will plan to see her back in the office in 4-6 weeks in collaboration with her other oncology appointments. Sooner if needed.    Patient expressed understanding and was in agreement with this plan. She also understands that She can call the clinic at any time with any questions, concerns, or complaints.       Any controlled substances utilized were prescribed in the context of palliative care. PDMP has been reviewed.    Time Total: 30 min  Visit consisted of counseling and education dealing with the complex and emotionally intense issues of symptom management and palliative care in the setting of serious and potentially life-threatening illness.Greater than 50%  of this time was spent counseling and coordinating care related to the above assessment and plan.  Marvis Moeller, AGPCNP-BC  Palliative Medicine Team/[facility]

## 2022-08-28 NOTE — Patient Instructions (Signed)
Visit Information  Thank you for taking time to visit with me today. Please don't hesitate to contact me if I can be of assistance to you.   Following are the goals we discussed today:   Goals Addressed               This Visit's Progress     Assist with Completion of Application for Personal Care Services through [facility]. (pt-stated)   On track     Care Coordination Interventions:  Active Listening & Reflection Utilized.  Verbalization of Feelings Encouraged.  Emotional Support Provided. Client-Centered Therapy Initiated. Solution-Focused Strategies Activated. Problem Solving Interventions Employed. Task-Centered Solutions Revised.   Consider Self-Enrollment in A Support Group of Interest, Offered at The [facility] [phone]). Continue to Receive Simplified Nutritional Assistance Program Services (SNAP), through The [place] [phone]). CSW Collaboration with Cherene Altes, Medicaid Case Worker with the [place] [phone]), to Inquire About Medicaid Status. ~ Cherene Altes, Medicaid Case Worker with the [place] [phone]), Reported Medicaid Application is Still Pending, As of 08/28/2022. ~ Cherene Altes, Medicaid Case Worker with the [place] [phone]), Reported Plan to Try & Get Patient Approved for Full Medicaid Benefits, Under New Medicaid Expansion Program Guidelines, Effective 07/18/2022. Confirmed Receipt & Thoroughly Reviewed the Following Set of Instructions & Application, to Ensure Understanding: ~ [place] Instructions ~ [place] Application ~ [place] Provider List Hold Off on Completing Application for [place], Until Letter of Approval/Denial Received from The [place] [phone]), Regarding Medicaid Application Status.       Our next appointment is by telephone on 09/11/2022 at 10:15 am.  Please call the care guide team at [phone] if you need to cancel or reschedule your appointment.   If you are experiencing a Mental Health or [place] or need someone to talk to, please call the Suicide and Crisis Lifeline: 988 call the [place] National Suicide Prevention Lifeline: 514-007-7610 or TTY: (906)050-6187 TTY 904-557-7270) to talk to a trained counselor call 1-800-273-TALK (toll free, 24 hour hotline) go to [facility] [address], [place] [phone]) call the [place]: [phone] call 911  Patient verbalizes understanding of instructions and care plan provided today and agrees to view in [place]. Active MyChart status and patient understanding of how to access instructions and care plan via MyChart confirmed with patient.     Telephone follow up appointment with care management team member scheduled for:  09/11/2022 at 10:15 am.    Nat Christen, BSW, MSW, [place]  Licensed Clinical Social Worker  [place]  Mailing [place]. [address] [place], [place] [address] Physical Address-[address], [place], [place] [address] Toll Free Main # [phone] Fax # [phone] Cell # [phone] [email or web address]

## 2022-08-28 NOTE — Patient Outreach (Signed)
  Care Coordination   Follow Up Visit Note   08/28/2022  Name: [name] MRN: [id number] DOB: Dec 27, 1950  [name] is a 72 y.o. year old female who sees Chambliss, Jeb Levering, MD for primary care. I spoke with [name] by phone today.  What matters to the patients health and wellness today? Assist with Completion of Application for Personal Care Services through [facility].   Goals Addressed               This Visit's Progress     Assist with Completion of Application for Personal Care Services through [facility]. (pt-stated)   On track     Care Coordination Interventions:  Active Listening & Reflection Utilized.  Verbalization of Feelings Encouraged.  Emotional Support Provided. Client-Centered Therapy Initiated. Solution-Focused Strategies Activated. Problem Solving Interventions Employed. Task-Centered Solutions Revised.   Consider Self-Enrollment in A Support Group of Interest, Offered at The [facility] [phone]). Continue to Receive Simplified Nutritional Assistance Program Services (SNAP), through The [place] [phone]). CSW Collaboration with Cherene Altes, Medicaid Case Worker with the [place] [phone]), to Inquire About Medicaid Status. ~ Cherene Altes, Medicaid Case Worker with the [place] [phone]), Reported Medicaid Application is Still Pending, As of 08/28/2022. ~ Cherene Altes, Medicaid Case Worker with the [place] [phone]), Reported Plan to Try & Get Patient Approved for Full Medicaid Benefits, Under New Medicaid Expansion Program Guidelines, Effective 07/18/2022. Confirmed Receipt & Thoroughly Reviewed the Following Set of Instructions & Application, to Ensure Understanding: ~ [place] Instructions ~  [place] Application ~ [place] Provider List Hold Off on Completing Application for [place], Until Letter of Approval/Denial Received from The [place] [phone]), Regarding Medicaid Application Status.       SDOH assessments and interventions completed:  Yes.  Care Coordination Interventions:  Yes, provided   Follow up plan: Follow up call scheduled for 09/11/2022 at 10:15 am.  Encounter Outcome:  Pt. Visit Completed.   Nat Christen, BSW, MSW, LCSW  Licensed Education officer, environmental Health System  Mailing [place] [address]. [address], [place], [place] [address] Physical Address-[address] [place], [place] [address] Toll Free Main # [phone] Fax # [phone] Cell # [phone] [email or web address]

## 2022-08-29 ENCOUNTER — Encounter: Payer: Self-pay | Admitting: Hematology and Oncology

## 2022-09-04 ENCOUNTER — Encounter: Payer: Self-pay | Admitting: Hematology and Oncology

## 2022-09-09 DIAGNOSIS — R062 Wheezing: Secondary | ICD-10-CM | POA: Diagnosis not present

## 2022-09-09 DIAGNOSIS — U071 COVID-19: Secondary | ICD-10-CM | POA: Diagnosis not present

## 2022-09-09 DIAGNOSIS — M25569 Pain in unspecified knee: Secondary | ICD-10-CM | POA: Diagnosis not present

## 2022-09-09 DIAGNOSIS — I5020 Unspecified systolic (congestive) heart failure: Secondary | ICD-10-CM | POA: Diagnosis not present

## 2022-09-10 DIAGNOSIS — R062 Wheezing: Secondary | ICD-10-CM | POA: Diagnosis not present

## 2022-09-10 DIAGNOSIS — I5020 Unspecified systolic (congestive) heart failure: Secondary | ICD-10-CM | POA: Diagnosis not present

## 2022-09-10 DIAGNOSIS — M25569 Pain in unspecified knee: Secondary | ICD-10-CM | POA: Diagnosis not present

## 2022-09-10 DIAGNOSIS — U071 COVID-19: Secondary | ICD-10-CM | POA: Diagnosis not present

## 2022-09-11 ENCOUNTER — Ambulatory Visit: Payer: Self-pay | Admitting: *Deleted

## 2022-09-11 ENCOUNTER — Encounter: Payer: Self-pay | Admitting: *Deleted

## 2022-09-11 NOTE — Patient Instructions (Signed)
Visit Information  Thank you for taking time to visit with me today. Please don't hesitate to contact me if I can be of assistance to you.   Following are the goals we discussed today:   Goals Addressed               This Visit's Progress     Assist with Completion of Application for Personal Care Services through [facility]. (pt-stated)   On track     Care Coordination Interventions:  Active Listening & Reflection Utilized.  Verbalization of Feelings Encouraged.  Emotional Support Provided. Feelings of Loss of Independence Validated. Client-Centered Therapy Performed. Acceptance & Commitment Therapy Introduced. Brief Cognitive Behavioral Therapy Initiated. Solution-Focused Strategies Enacted. Problem Solving Interventions Employed. Task-Centered Solutions Implemented.  Continue to Consider Self-Enrollment in A Support Group of Interest, Offered at The [facility] [phone]), from List Provided. Continue to Receive Simplified Nutritional Assistance Program Services (SNAP), through The [place] [phone]). CSW Collaboration with Cherene Altes, Medicaid Case Worker with the [place] [phone]), to Inquire About Medicaid Status. ~ Cherene Altes, Medicaid Case Worker with the [place] [phone]), Reported Medicaid Application is Still Pending, As of 09/11/2022. ~ Cherene Altes, Medicaid Case Worker with the [place] [phone]), Reported Plan to Try & Get Patient Approved for Full Medicaid Benefits, Under New Medicaid Expansion Program, Effective 07/18/2022. Hold Off on Completing Application for [place] to [facility] [phone]) for Processing, Until Letter of Approval Received from The [place] [phone]), Regarding Medicaid Application Status.      Our next appointment is by telephone on 10/01/2022 at 10:30 am.  Please call the care guide team at [phone] if you need to cancel or reschedule your appointment.   If you are experiencing a Mental Health or [place] or need someone to talk to, please call the Suicide and Crisis Lifeline: 988 call the [place] National Suicide Prevention Lifeline: 531-126-8682 or TTY: 519-120-8414 TTY (640)326-9204) to talk to a trained counselor call 1-800-273-TALK (toll free, 24 hour hotline) go to [facility] [address] [place] [phone]) call the [place]: [phone] call 911  Patient verbalizes understanding of instructions and care plan provided today and agrees to view in [place]. Active MyChart status and patient understanding of how to access instructions and care plan via MyChart confirmed with patient.     Telephone follow up appointment with care management team member scheduled for:  10/01/2022 at 10:30 am.  Nat Christen, BSW, MSW, [place]  Licensed Clinical Social Worker  [place]  Mailing [place]. [address], [place], [place] [address] Physical Address-[address], [place], [place] [address] Toll Free Main # [phone] Fax # [phone] Cell # [phone] [email or web address]

## 2022-09-11 NOTE — Patient Outreach (Signed)
  Care Coordination   Follow Up Visit Note   09/11/2022  Name: DANYIEL CRESPIN MRN: [id number] DOB: 06/30/1951  Sheila Oats Dukeman is a 72 y.o. year old female who sees Chambliss, Jeb Levering, MD for primary care. I spoke with [name] by phone today.  What matters to the patients health and wellness today?   Assist with Completion of Application for Personal Care Services through [facility].   Goals Addressed               This Visit's Progress     Assist with Completion of Application for Personal Care Services through [facility]. (pt-stated)   On track     Care Coordination Interventions:  Active Listening & Reflection Utilized.  Verbalization of Feelings Encouraged.  Emotional Support Provided. Feelings of Loss of Independence Validated. Client-Centered Therapy Performed. Acceptance & Commitment Therapy Introduced. Brief Cognitive Behavioral Therapy Initiated. Solution-Focused Strategies Enacted. Problem Solving Interventions Employed. Task-Centered Solutions Implemented.  Continue to Consider Self-Enrollment in A Support Group of Interest, Offered at The [facility] [phone]), from List Provided. Continue to Receive Simplified Nutritional Assistance Program Services (SNAP), through The [place] [phone]). CSW Collaboration with Cherene Altes, Medicaid Case Worker with the [place] [phone]), to Inquire About Medicaid Status. ~ Cherene Altes, Medicaid Case Worker with the [place] [phone]), Reported Medicaid Application is Still Pending, As of 09/11/2022. ~ Cherene Altes, Medicaid Case Worker with the [place] [phone]), Reported Plan to Try & Get Patient Approved for Full Medicaid Benefits, Under New Medicaid Expansion Program, Effective  07/18/2022. Hold Off on Completing Application for [place] to [facility] [phone]) for Processing, Until Letter of Approval Received from The [place] [phone]), Regarding Medicaid Application Status.      SDOH assessments and interventions completed:  Yes.  Care Coordination Interventions:  Yes, provided.   Follow up plan: Follow up call scheduled for 10/01/2022 at 10:30 am.  Encounter Outcome:  Pt. Visit Completed.   Nat Christen, BSW, MSW, LCSW  Licensed Education officer, environmental Health System  Mailing [place]. [address] [place], [place] [address] Physical Address-[address] [place], [place] [address] Toll Free Main # [phone] Fax # [phone] Cell # [phone] [email or web address]

## 2022-09-18 ENCOUNTER — Encounter: Payer: Self-pay | Admitting: Hematology and Oncology

## 2022-09-18 ENCOUNTER — Telehealth: Payer: Self-pay

## 2022-09-18 NOTE — Telephone Encounter (Signed)
Contacted patient to have her upload her CGM data as she was not able to during our virtual visit on 08/21/2022.  Today, patient is still having difficulty uploading her information. I was able to see 08/08/22-08/21/22: % Time CGM is active: 81% Average Glucose: 133 mg/dL Glucose Management Indicator: 6.5  Glucose Variability: 17.6% (goal <36%) Time in Goal:  - Time in range 70-180: 95% - Time above range: 5% - Time below range: 0%  She was able to provide me her averages. 7d average: 134 14 day: 142 30 day: 139 90 day: 136  Patient denies hypoglycemia.   No changes to regimen recommended.   Joseph Art, Pharm.D. PGY-2 Ambulatory Care Pharmacy Resident 09/18/2022 9:53 AM

## 2022-09-22 ENCOUNTER — Telehealth: Payer: Self-pay | Admitting: Pharmacist

## 2022-09-22 DIAGNOSIS — E1149 Type 2 diabetes mellitus with other diabetic neurological complication: Secondary | ICD-10-CM

## 2022-09-22 MED ORDER — LANTUS SOLOSTAR 100 UNIT/ML ~~LOC~~ SOPN: 18.0000 [IU] | PEN_INJECTOR | Freq: Every day | SUBCUTANEOUS | Status: DC

## 2022-09-22 MED ORDER — INSULIN LISPRO (1 UNIT DIAL) 100 UNIT/ML (KWIKPEN): 6.0000 [IU] | PEN_INJECTOR | Freq: Three times a day (TID) | SUBCUTANEOUS | Status: DC

## 2022-09-22 NOTE — Telephone Encounter (Signed)
Phone call to patient to review recent blood sugar control.    Patient reports she is doing well overall.  Minimal GI discomfort / nausea.     Reports trying to AVOID use of "pills" States not taking Valium (diazepam) nor oxycodone unless pain is severe.  Also only using the imodium (loperamide) prior to oncology appointments to avoid stooling on her "chemo days"  Weight reported as stable - Cancer center appt - 304 lbs.   Date of Download: 09/18/2022 (date of download) % Time CGM is active: 80% Average Glucose: 141 mg/dL Glucose Management Indicator: 6.7  Glucose Variability: 18.2 (goal <36%) Time in Goal:  - Time in range 70-180: 93% - Time above range: 7% - Time below range: 0% Observed patterns: Minimal variation.    Regimen reported: - Ozempic (semaglutide) - 2mg  weekly on Tuesday  - Lantus (Insulin glargine) 18 units once daily in the AM  - Humalog  (insulin lispro) - 8-10 units with meals BID-TID NO CHANGE today to her diabetes regimen today.  Encouraged patient to consider 6, 8 or 10 units of mealtime insulin (use six for a smaller - low carb) Also asked patient to track if any GI symptoms are more common on days 2-4 after her Ozemic (semglutide) injection.  (Consider decreasing a few clicks lower than 2mg  if that could be cause).  We also discussed multiple episodes of "vaso-vagal" (term used - by patient) when she is post- toileting. The two episodes over the last two weeks required several minutes to resolve and required assistance from another person to mover her to the bed.

## 2022-09-22 NOTE — Telephone Encounter (Signed)
Noted and agree. 

## 2022-09-23 ENCOUNTER — Other Ambulatory Visit (HOSPITAL_COMMUNITY): Payer: Self-pay

## 2022-09-25 ENCOUNTER — Inpatient Hospital Stay (HOSPITAL_BASED_OUTPATIENT_CLINIC_OR_DEPARTMENT_OTHER): Payer: 59 | Admitting: Adult Health

## 2022-09-25 ENCOUNTER — Encounter: Payer: Self-pay | Admitting: Adult Health

## 2022-09-25 ENCOUNTER — Inpatient Hospital Stay: Payer: 59

## 2022-09-25 ENCOUNTER — Inpatient Hospital Stay: Payer: 59 | Attending: Oncology

## 2022-09-25 VITALS — BP 162/69 | HR 61 | Temp 97.3°F | Resp 18 | Wt 303.2 lb

## 2022-09-25 DIAGNOSIS — Z5112 Encounter for antineoplastic immunotherapy: Secondary | ICD-10-CM | POA: Diagnosis not present

## 2022-09-25 DIAGNOSIS — Z86718 Personal history of other venous thrombosis and embolism: Secondary | ICD-10-CM | POA: Insufficient documentation

## 2022-09-25 DIAGNOSIS — E119 Type 2 diabetes mellitus without complications: Secondary | ICD-10-CM | POA: Insufficient documentation

## 2022-09-25 DIAGNOSIS — C50919 Malignant neoplasm of unspecified site of unspecified female breast: Secondary | ICD-10-CM | POA: Diagnosis not present

## 2022-09-25 DIAGNOSIS — Z170 Estrogen receptor positive status [ER+]: Secondary | ICD-10-CM

## 2022-09-25 DIAGNOSIS — C50212 Malignant neoplasm of upper-inner quadrant of left female breast: Secondary | ICD-10-CM | POA: Diagnosis not present

## 2022-09-25 DIAGNOSIS — C7800 Secondary malignant neoplasm of unspecified lung: Secondary | ICD-10-CM

## 2022-09-25 DIAGNOSIS — Z87891 Personal history of nicotine dependence: Secondary | ICD-10-CM | POA: Insufficient documentation

## 2022-09-25 DIAGNOSIS — Z79811 Long term (current) use of aromatase inhibitors: Secondary | ICD-10-CM | POA: Diagnosis not present

## 2022-09-25 DIAGNOSIS — I10 Essential (primary) hypertension: Secondary | ICD-10-CM | POA: Diagnosis not present

## 2022-09-25 DIAGNOSIS — Z95828 Presence of other vascular implants and grafts: Secondary | ICD-10-CM

## 2022-09-25 LAB — CMP (CANCER CENTER ONLY)
ALT: 57 U/L — ABNORMAL HIGH (ref 0–44)
AST: 41 U/L (ref 15–41)
Albumin: 3.6 g/dL (ref 3.5–5.0)
Alkaline Phosphatase: 73 U/L (ref 38–126)
Anion gap: 6 (ref 5–15)
BUN: 16 mg/dL (ref 8–23)
CO2: 31 mmol/L (ref 22–32)
Calcium: 9.4 mg/dL (ref 8.9–10.3)
Chloride: 103 mmol/L (ref 98–111)
Creatinine: 0.66 mg/dL (ref 0.44–1.00)
GFR, Estimated: >60 mL/min (ref >60)
Glucose, Bld: 115 mg/dL — ABNORMAL HIGH (ref 70–99)
Potassium: 4 mmol/L (ref 3.5–5.1)
Sodium: 140 mmol/L (ref 135–145)
Total Bilirubin: 0.4 mg/dL (ref 0.3–1.2)
Total Protein: 7 g/dL (ref 6.5–8.1)

## 2022-09-25 LAB — CBC WITH DIFFERENTIAL (CANCER CENTER ONLY)
Abs Immature Granulocytes: 0.03 10*3/uL (ref 0.00–0.07)
Basophils Absolute: 0 10*3/uL (ref 0.0–0.1)
Basophils Relative: 0 %
Eosinophils Absolute: 0.1 10*3/uL (ref 0.0–0.5)
Eosinophils Relative: 1 %
HCT: 39.6 % (ref 36.0–46.0)
Hemoglobin: 13.7 g/dL (ref 12.0–15.0)
Immature Granulocytes: 0 %
Lymphocytes Relative: 34 %
Lymphs Abs: 2.3 10*3/uL (ref 0.7–4.0)
MCH: 30.2 pg (ref 26.0–34.0)
MCHC: 34.6 g/dL (ref 30.0–36.0)
MCV: 87.2 fL (ref 80.0–100.0)
Monocytes Absolute: 0.5 10*3/uL (ref 0.1–1.0)
Monocytes Relative: 7 %
Neutro Abs: 4 10*3/uL (ref 1.7–7.7)
Neutrophils Relative %: 58 %
Platelet Count: 224 10*3/uL (ref 150–400)
RBC: 4.54 MIL/uL (ref 3.87–5.11)
RDW: 14 % (ref 11.5–15.5)
WBC Count: 6.9 10*3/uL (ref 4.0–10.5)
nRBC: 0 % (ref 0.0–0.2)

## 2022-09-25 MED ORDER — TRASTUZUMAB-ANNS CHEMO 150 MG IV SOLR
750.0000 mg | Freq: Once | INTRAVENOUS | Status: AC
  Administered 2022-09-25: 750 mg via INTRAVENOUS
  Filled 2022-09-25: qty 35.71

## 2022-09-25 MED ORDER — SODIUM CHLORIDE 0.9% FLUSH
10.0000 mL | Freq: Once | INTRAVENOUS | Status: AC
  Administered 2022-09-25: 10 mL

## 2022-09-25 MED ORDER — SODIUM CHLORIDE 0.9 % IV SOLN: Freq: Once | INTRAVENOUS | Status: AC

## 2022-09-25 MED ORDER — LORAZEPAM 2 MG/ML IJ SOLN
0.5000 mg | Freq: Once | INTRAMUSCULAR | Status: AC
  Administered 2022-09-25: 0.5 mg via INTRAVENOUS
  Filled 2022-09-25: qty 1

## 2022-09-25 MED ORDER — DIPHENHYDRAMINE HCL 25 MG PO CAPS
25.0000 mg | ORAL_CAPSULE | Freq: Once | ORAL | Status: AC
  Administered 2022-09-25: 25 mg via ORAL
  Filled 2022-09-25: qty 1

## 2022-09-25 MED ORDER — ACETAMINOPHEN 325 MG PO TABS
650.0000 mg | ORAL_TABLET | Freq: Once | ORAL | Status: AC
  Administered 2022-09-25: 650 mg via ORAL
  Filled 2022-09-25: qty 2

## 2022-09-25 NOTE — Patient Instructions (Signed)
[facility]  Discharge Instructions: Thank you for choosing [facility] to provide your oncology and hematology care.   If you have a lab appointment with the [facility], please go directly to the [facility] and check in at the registration area.   Wear comfortable clothing and clothing appropriate for easy access to any Portacath or PICC line.   We strive to give you quality time with your provider. You may need to reschedule your appointment if you arrive late (15 or more minutes).  Arriving late affects you and other patients whose appointments are after yours.  Also, if you miss three or more appointments without notifying the office, you may be dismissed from the clinic at the provider's discretion.      For prescription refill requests, have your pharmacy contact our office and allow 72 hours for refills to be completed.    Today you received the following chemotherapy and/or immunotherapy agents: Trastuzumab      To help prevent nausea and vomiting after your treatment, we encourage you to take your nausea medication as directed.  BELOW ARE SYMPTOMS THAT SHOULD BE REPORTED IMMEDIATELY: *FEVER GREATER THAN 100.4 F (38 C) OR HIGHER *CHILLS OR SWEATING *NAUSEA AND VOMITING THAT IS NOT CONTROLLED WITH YOUR NAUSEA MEDICATION *UNUSUAL SHORTNESS OF BREATH *UNUSUAL BRUISING OR BLEEDING *URINARY PROBLEMS (pain or burning when urinating, or frequent urination) *BOWEL PROBLEMS (unusual diarrhea, constipation, pain near the anus) TENDERNESS IN MOUTH AND THROAT WITH OR WITHOUT PRESENCE OF ULCERS (sore throat, sores in mouth, or a toothache) UNUSUAL RASH, SWELLING OR PAIN  UNUSUAL VAGINAL DISCHARGE OR ITCHING   Items with * indicate a potential emergency and should be followed up as soon as possible or go to the Emergency Department if any problems should occur.  Please show the CHEMOTHERAPY ALERT CARD or IMMUNOTHERAPY ALERT CARD at  check-in to the Emergency Department and triage nurse.  Should you have questions after your visit or need to cancel or reschedule your appointment, please contact [facility]  Dept: [phone]  and follow the prompts.  Office hours are 8:00 a.m. to 4:30 p.m. Monday - Friday. Please note that voicemails left after 4:00 p.m. may not be returned until the following business day.  We are closed weekends and major holidays. You have access to a nurse at all times for urgent questions. Please call the main number to the clinic Dept: [phone] and follow the prompts.   For any non-urgent questions, you may also contact your provider using MyChart. We now offer e-Visits for anyone 47 and older to request care online for non-urgent symptoms. For details visit [email or web address].   Also download the MyChart app! Go to the app store, search "MyChart", open the app, select [facility], and log in with your MyChart username and password.  Masks are optional in the cancer centers. If you would like for your care team to wear a mask while they are taking care of you, please let them know. You may have one support person who is at least 73 years old accompany you for your appointments.

## 2022-09-25 NOTE — Progress Notes (Signed)
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 [facility]) Staging form: Breast, AJCC 7th Edition - Clinical: Stage IV ([place], NX, M1) - Signed by Chauncey Cruel, MD on 04/17/2015  Malignant neoplasm of upper-inner quadrant of left breast in female, estrogen receptor positive ([facility]) Staging form: Breast, AJCC 7th Edition - Clinical: Stage IA (T1c, N0, cM0) - Unsigned Specimen type: Core Needle Biopsy Histopathologic type: 9931 Laterality: Left Staging comments: Staged at breast conference 4.2.14  - Pathologic: Stage IV (M1) - Unsigned Specimen type: Core Needle Biopsy Histopathologic type: 9931 Laterality: Left   SUMMARY OF ONCOLOGIC HISTORY: 72 y.o. [name] with stage IV breast cancer initially diagnosed March 2014    (1) s/p left breast upper inner quadrant biopsy 11/05/2012 for a clinical T1c NX M1, stage IV invasive ductal carcinoma, grade 3, estrogen and progesterone receptor negative, with an MIB-1 of 77%, and HER-2 amplified by CISH with a ratio of 4.39.             (a) mammography 04/16/2016 shows the left breast mass to have nearly completely resolved   (2) chest, abdomen and pelvis CT scans and PET scan April 2014 showed multiple bilateral pulmonaru nodules but no liver or bone involvement; biopsy of a pulmonary nodule on 11/30/2012 confirmed metastatic breast cancer.              (a) CT in GI obtained 09/28/2014 shows no measurable disease in the lungs             (b) chest CT 12/20/2015 showed stable small right lung pulmonary nodules and an area in the right lower lobe pleural parenchymal thickness requiring attention in future studies              (3) received docetaxel / trastuzumab/ pertuzumab x4, completed 02/07/2013, with a good response,    (4) trastuzumab/ pertuzumab continued every 21 days;             (a) Pertuzumab  discontinued after 07/28/2016, and Trastuzumab given every 4 weeks starting 08/2016             (b) most recent echocardiogram 06/28/2018 shows EF of 60-65% (these will be every 6 months)   (5) anastrozole started 02/15/2013, discontinued October 2014 with poor tolerance   (6) 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   CURRENT THERAPY: Letrozole and Trastuzumab given every 4 weeks  INTERVAL HISTORY: [name] 72 y.o. female returns for f/u of her metastatic breast cancer on treatment with Letrozole daily and Trastuzumab every 4 weeks.  She is tolerating this well.  She has been struggling with chronic pain and side effects from pain medications.  She is working to lose weight and notes that the medication helps keep her functional but the side effects including constipation and drowsiness have led her to discontinue all of them.  She is working with her PCP to look into using THC products from the [place].    Her most recent echo occurred on July 31, 2022 and demonstrated a left ventricular ejection fraction of 60 to 65%.  He undergoes echocardiograms every 6 months.  Her  most recent breast cancer restaging occurred on October 10, 2021 and demonstrated no evidence of metastatic breast cancer progression.  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  MEDICAL HISTORY: Past Medical History:  Diagnosis Date   Arthritis    Back pain    Breast cancer ([facility]) dx'd 11/2012   left   Chest pain    COPD (chronic obstructive pulmonary disease) (HCC)    Diabetes mellitus without complication ([facility]) 7/86/7672   Ear pain    Fatty liver 6/03   Hypertension    Lung disease    Lung metastases dx'd 11/2012   Obesity, unspecified    Other abnormal glucose    Suicide attempt ([facility]) 1996   Syncope and collapse    Unspecified sleep apnea     SURGICAL HISTORY: Past Surgical History:  Procedure Laterality Date   CARDIAC CATHETERIZATION     2007   CHOLECYSTECTOMY     TUBAL LIGATION      SOCIAL HISTORY: Social History   Socioeconomic History   Marital status: Divorced    Spouse name: Not on file   Number of children: 4   Years of education: 12   Highest education level: 12th grade  Occupational History   Occupation: Disability   Occupation: retired-daycare  Tobacco Use   Smoking status: Former    Packs/day: 3.00    Years: 5.00    Total pack years: 15.00    Types: Cigarettes    Quit date: 08/18/1968    Years since quitting: 54.1    Passive exposure: Past   Smokeless tobacco: Never  Vaping Use   Vaping Use: Never used  Substance and Sexual Activity   Alcohol use: No    Alcohol/week: 0.0 standard drinks of alcohol   Drug use: No   Sexual activity: Not Currently    Partners: Male  Other Topics Concern   Not on file  Social History Narrative   Health Care POA:    Emergency Contact: Thomasenia Bottoms [phone]   End of Life Plan:    Who lives with you: two grandchildren, friend, daughter   Any pets: 3 poodles   Diet: Pt has a variety of protein, starch, vegetables.  Pt is currently working on cutting back on portions for weight loss.    Exercise: Pt has not regular exercise routine.  Occasionally walks around home.   Seatbelts: Pt reports wearing seatbelt occasionally.   Sun Exposure/Protection: Pt does not use sun protection   Hobbies: reading, playing on kindle, ebay         Currently in her home she keeps her granddaughter [name], 70, who is the daughter of the patient's daughter [name] (the patient refers to [name] as "my adopted daughter"); grandson Depace "[name]" Chirco, [place], who is [name]'s half-brother; [name], and an Dominica friend, [name], the patient's significant other. [name] is a Network engineer, currently unemployed. Son  Richard Brewing technologist works as an Clinical biochemist in [place]. Daughter [name] is currently in prison due to killing someone in a car accident. Daughter [name] died from aplastic anemia at the age of 58. The patient has a total of 4 grandchildren. She is not a church attender   Social Determinants of Health   Financial Resource Strain: Medium Risk (07/30/2022)   Overall Financial Resource Strain (CARDIA)    Difficulty of Paying Living Expenses: Somewhat hard  Food Insecurity: No Food Insecurity (07/30/2022)   Hunger Vital Sign    Worried About Running Out of Food in the Last Year: Never true    [place] in the Last Year: Never true  Transportation Needs: No Transportation Needs (07/30/2022)   PRAPARE - Hydrologist (Medical): No    Lack of Transportation (Non-Medical): No  Physical Activity: Inactive (07/30/2022)   Exercise Vital Sign    Days of Exercise per Week: 0 days    Minutes of Exercise per Session: 0 min  Stress: No Stress Concern Present (07/30/2022)   [place]    Feeling of Stress : Only a little  Social Connections: Moderately Integrated (07/30/2022)   Social Connection and Isolation Panel [NHANES]    Frequency of  Communication with Friends and Family: More than three times a week    Frequency of Social Gatherings with Friends and Family: More than three times a week    Attends Religious Services: More than 4 times per year    Active Member of [employer] or Organizations: Yes    Attends Music therapist: More than 4 times per year    Marital Status: Divorced  Intimate Partner Violence: Not At Risk (07/30/2022)   Humiliation, Afraid, Rape, and Kick questionnaire    Fear of Current or Ex-Partner: No    Emotionally Abused: No    Physically Abused: No    Sexually Abused: No    FAMILY HISTORY: Family History  Problem Relation Age of Onset   Coronary artery disease Father 63   Diabetes Father    Heart disease Father    Breast cancer Mother 19   Cancer Mother 77       breast   Aplastic anemia Daughter        died at age 63   Cancer Maternal Aunt 16       ovarian   Cancer Maternal Grandmother 66       ovarian   Cancer Paternal Aunt 27       ovarian/breast/breast   Coronary artery disease Sister 76   Coronary artery disease Brother 45    Review of Systems  Constitutional:  Positive for fatigue. Negative for appetite change, chills, fever and unexpected weight change.  HENT:   Negative for hearing loss, lump/mass and trouble swallowing.   Eyes:  Negative for eye problems and icterus.  Respiratory:  Negative for chest tightness, cough and shortness of breath.   Cardiovascular:  Negative for chest pain, leg swelling and palpitations.  Gastrointestinal:  Negative for abdominal distention, abdominal pain, constipation, diarrhea, nausea and vomiting.  Endocrine: Negative for hot flashes.  Genitourinary:  Negative for difficulty urinating.   Musculoskeletal:  Positive for arthralgias and back pain.  Skin:  Negative for itching and rash.  Neurological:  Negative for dizziness, extremity weakness, headaches and numbness.  Hematological:  Negative for adenopathy. Does not bruise/bleed  easily.  Psychiatric/Behavioral:  Negative for depression. The patient is  not nervous/anxious.       PHYSICAL EXAMINATION  ECOG PERFORMANCE STATUS: 3 - Symptomatic, >50% confined to bed  Vitals:   09/25/22 1507  BP: (!) 162/69  Pulse: 61  Resp: 18  Temp: (!) 97.3 F (36.3 C)  SpO2: 100%    Physical Exam Constitutional:      General: She is not in acute distress.    Appearance: Normal appearance. She is obese. She is not toxic-appearing.     Comments: Examined in wheelchair  HENT:     Head: Normocephalic and atraumatic.  Eyes:     General: No scleral icterus. Cardiovascular:     Rate and Rhythm: Normal rate and regular rhythm.     Pulses: Normal pulses.     Heart sounds: Normal heart sounds.  Pulmonary:     Effort: Pulmonary effort is normal.     Breath sounds: Normal breath sounds.  Abdominal:     General: Abdomen is flat. Bowel sounds are normal. There is no distension.     Palpations: Abdomen is soft.     Tenderness: There is no abdominal tenderness.  Musculoskeletal:        General: No swelling.     Cervical back: Neck supple.  Lymphadenopathy:     Cervical: No cervical adenopathy.  Skin:    General: Skin is warm and dry.     Findings: No rash.  Neurological:     General: No focal deficit present.     Mental Status: She is alert.  Psychiatric:        Mood and Affect: Mood normal.        Behavior: Behavior normal.     LABORATORY DATA:  CBC    Component Value Date/Time   WBC 6.9 09/25/2022 1437   WBC 8.0 05/10/2020 0447   RBC 4.54 09/25/2022 1437   HGB 13.7 09/25/2022 1437   HGB 12.8 08/05/2017 0927   HCT 39.6 09/25/2022 1437   HCT 39.8 08/05/2017 0927   PLT 224 09/25/2022 1437   PLT 267 08/05/2017 0927   MCV 87.2 09/25/2022 1437   MCV 86.7 08/05/2017 0927   MCH 30.2 09/25/2022 1437   MCHC 34.6 09/25/2022 1437   RDW 14.0 09/25/2022 1437   RDW 14.3 08/05/2017 0927   LYMPHSABS 2.3 09/25/2022 1437   LYMPHSABS 2.3 08/05/2017 0927   MONOABS  0.5 09/25/2022 1437   MONOABS 0.4 08/05/2017 0927   EOSABS 0.1 09/25/2022 1437   EOSABS 0.1 08/05/2017 0927   BASOSABS 0.0 09/25/2022 1437   BASOSABS 0.0 08/05/2017 0927    CMP     Component Value Date/Time   NA 140 09/25/2022 1437   NA 141 08/05/2017 0927   K 4.0 09/25/2022 1437   K 4.0 08/05/2017 0927   CL 103 09/25/2022 1437   CL 101 02/07/2013 1125   CO2 31 09/25/2022 1437   CO2 31 (H) 08/05/2017 0927   GLUCOSE 115 (H) 09/25/2022 1437   GLUCOSE 125 08/05/2017 0927   GLUCOSE 193 (H) 02/07/2013 1125   BUN 16 09/25/2022 1437   BUN 22.7 08/05/2017 0927   CREATININE 0.66 09/25/2022 1437   CREATININE 0.8 08/05/2017 0927   CALCIUM 9.4 09/25/2022 1437   CALCIUM 9.7 08/05/2017 0927   PROT 7.0 09/25/2022 1437   PROT 7.3 08/05/2017 0927   ALBUMIN 3.6 09/25/2022 1437   ALBUMIN 3.2 (L) 08/05/2017 0927   AST 41 09/25/2022 1437   AST 19 08/05/2017 0927   ALT 57 (H) 09/25/2022 1437   ALT 28  08/05/2017 0927   ALKPHOS 73 09/25/2022 1437   ALKPHOS 108 08/05/2017 0927   BILITOT 0.4 09/25/2022 1437   BILITOT 0.34 08/05/2017 0927   GFRNONAA >60 09/25/2022 1437   GFRAA >60 05/10/2020 0447   GFRAA >60 04/18/2020 1023        ASSESSMENT and THERAPY PLAN:   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  1.  Metastatic breast cancer: She has no clinical or radiographic signs of progression of her metastatic breast cancer.  Her echocardiograms occur every 6 months and she will be due for repeat echo in June, 2024.  She is due for restaging.  I let her know that we need to do this every year to continue to assess her treatment response for her metastatic breast cancer.  She will continue Letrozole daily and Herceptin every 4 weeks.    2. Chronic pain: She let me know that she is following up with her PCP about this and is exploring alternate treatment options.    [place]  will return in 4 weeks for labs, f/u (to review scans), and her next Herceptin.    All questions were answered. The patient knows to call the clinic with any problems, questions or concerns. We can certainly see the patient much sooner if necessary.  Total encounter time:40 minutes*in face-to-face visit time, chart review, lab review, care coordination, order entry, and documentation of the encounter time.    [name], NP 09/29/22 9:40 AM Medical Oncology and Hematology [facility] [place], [place] [address] Tel. [phone]    Fax. [phone]  *Total Encounter Time as defined by the Centers for Medicare and Medicaid Services includes, in addition to the face-to-face time of a patient visit (documented in the note above) non-face-to-face time: obtaining and reviewing outside history, ordering and reviewing medications, tests or procedures, care coordination (communications with other health care professionals or caregivers) and documentation in the medical record.

## 2022-09-26 DIAGNOSIS — E1149 Type 2 diabetes mellitus with other diabetic neurological complication: Secondary | ICD-10-CM | POA: Diagnosis not present

## 2022-09-29 ENCOUNTER — Encounter: Payer: Self-pay | Admitting: Hematology and Oncology

## 2022-09-29 NOTE — Assessment & Plan Note (Signed)
[name] is a 72 year old woman with metastatic breast cancer to the lung noted in April 2014 (see oncologic history above).  1.  Metastatic breast cancer: She has no clinical or radiographic signs of progression of her metastatic breast cancer.  Her echocardiograms occur every 6 months and she will be due for repeat echo in June, 2024.  She is due for restaging.  I let her know that we need to do this every year to continue to assess her treatment response for her metastatic breast cancer.  She will continue Letrozole daily and Herceptin every 4 weeks.    2. Chronic pain: She let me know that she is following up with her PCP about this and is exploring alternate treatment options.    [name] will return in 4 weeks for labs, f/u (to review scans), and her next Herceptin.

## 2022-10-01 ENCOUNTER — Encounter: Payer: Self-pay | Admitting: *Deleted

## 2022-10-01 ENCOUNTER — Ambulatory Visit: Payer: Self-pay | Admitting: *Deleted

## 2022-10-01 NOTE — Patient Outreach (Signed)
Care Coordination   Follow Up Visit Note   10/01/2022  Name: [name] MRN: [id number] DOB: 1951/02/19  [name] is a 72 y.o. year old female who sees Chambliss, Jeb Levering, MD for primary care. I spoke with [name] by phone today.  What matters to the patients health and wellness today?   Assist with Completion of Application for Personal Care Services through [facility].    Goals Addressed               This Visit's Progress     COMPLETED: Assist with Completion of Application for Personal Care Services through [facility]. (pt-stated)   On track     Care Coordination Interventions:  Interventions Today    Flowsheet Row Most Recent Value  Chronic Disease   Chronic disease during today's visit Diabetes, Other  [Anxiety State & Financial Insecurity]  General Interventions   General Interventions Discussed/Reviewed General Interventions Discussed, General Interventions Reviewed, Durable Medical Equipment (DME), [employer], Doctor Visits, Health Screening, Annual Eye Exam  Doctor Visits Discussed/Reviewed Doctor Visits Discussed, Doctor Visits Reviewed, Annual Wellness Visits, PCP  Health Screening Colonoscopy, Mammogram  Museum/gallery conservator (DME) Glucomoter, Insulin Pump, Environmental consultant, Community education officer  PCP/Specialist Visits Compliance with follow-up visit  Exercise Interventions   Exercise Discussed/Reviewed Exercise Discussed, Exercise Reviewed, Physical Activity, Assistive device use and maintanence  Physical Activity Discussed/Reviewed Physical Activity Discussed, Physical Activity Reviewed, Types of exercise  Education Interventions   Education Provided Provided Engineer, site, Provided Education  Provided Verbal Education On Nutrition, Eye Care, Foot Care, Labs, Blood Sugar Monitoring, Mental Health/Coping with Illness, Applications, Exercise, Medication, When to see the doctor, [employer], [employer]   Labs Reviewed Hgb A1c  [place] Discussed, Mental Health Reviewed, Coping Strategies, Crisis, Anxiety, Depression, Grief and Loss  Nutrition Interventions   Nutrition Discussed/Reviewed Nutrition Discussed, Nutrition Reviewed, Decreasing sugar intake, Portion sizes, Fluid intake, Increaing proteins, Decreasing fats, Decreasing salt  Pharmacy Interventions   Pharmacy Dicussed/Reviewed Pharmacy Topics Discussed, Pharmacy Topics Reviewed, Medication Adherence, Affording Medications  Safety Interventions   Safety Discussed/Reviewed Safety Discussed, Safety Reviewed, Fall Risk  Advanced Directive Interventions   Advanced Directives Discussed/Reviewed Advanced Directives Discussed, Advanced Directives Reviewed     Active Listening & Reflection Utilized.  Verbalization of Feelings Encouraged.  Emotional Support Provided. Feelings of Loss of Independence Validated. Client-Centered Therapy Performed. Acceptance & Commitment Therapy Implemented. Cognitive Behavioral Therapy Initiated. Solution-Focused Strategies Enacted. Problem Solving Interventions Employed. Task-Centered Solutions Revised. Self-Enrollment in A Support Group of Interest Emphasized, from List Provided. Continue to Receive Simplified Nutritional Assistance Program Services (SNAP), through [facility] [phone]). Continue to Await Approval/Denial Letter from Cherene Altes, Medicaid Case Worker with the [facility] [phone]), Regarding Status of Medicaid Application. Contact CSW Directly (# [phone]), If Approved for Medicaid, through [facility] [phone]) & CSW Will Assist with Completion of Application for [place] to [facility] [phone]), for Processing.      SDOH  assessments and interventions completed:  Yes.  Care Coordination Interventions:  Yes, provided.   Follow up plan: No further intervention required.   Encounter Outcome:  Pt. Visit Completed.   Nat Christen, BSW, MSW, LCSW  Licensed Education officer, environmental Health System  Mailing [place] [address], [place], [place]  [place]. [address], [place], [place] [address] Toll Free Main # [phone] Fax # [phone] Cell # [phone] [email or web address]

## 2022-10-01 NOTE — Patient Instructions (Signed)
Visit Information  Thank you for taking time to visit with me today. Please don't hesitate to contact me if I can be of assistance to you.   Following are the goals we discussed today:   Goals Addressed               This Visit's Progress     COMPLETED: Assist with Completion of Application for Personal Care Services through [place]. (pt-stated)   On track     Care Coordination Interventions:  Interventions Today    Flowsheet Row Most Recent Value  Chronic Disease   Chronic disease during today's visit Diabetes, Other  [Anxiety State & Financial Insecurity]  General Interventions   General Interventions Discussed/Reviewed General Interventions Discussed, General Interventions Reviewed, Durable Medical Equipment (DME), [employer], Doctor Visits, Health Screening, Annual Eye Exam  Doctor Visits Discussed/Reviewed Doctor Visits Discussed, Doctor Visits Reviewed, Annual Wellness Visits, PCP  Health Screening Colonoscopy, Mammogram  Museum/gallery conservator (DME) Glucomoter, Insulin Pump, Environmental consultant, Community education officer  PCP/Specialist Visits Compliance with follow-up visit  Exercise Interventions   Exercise Discussed/Reviewed Exercise Discussed, Exercise Reviewed, Physical Activity, Assistive device use and maintanence  Physical Activity Discussed/Reviewed Physical Activity Discussed, Physical Activity Reviewed, Types of exercise  Education Interventions   Education Provided Provided Engineer, site, Provided Education  Provided Verbal Education On Nutrition, Eye Care, Foot Care, Labs, Blood Sugar Monitoring, Mental Health/Coping with Illness, Applications, Exercise, Medication, When to see the doctor, [employer], [employer]  Labs Reviewed Hgb A1c  Mental Health Interventions   [place] Discussed, Mental Health Reviewed, Coping Strategies, Crisis, Anxiety, Depression, Grief and Loss  Nutrition  Interventions   Nutrition Discussed/Reviewed Nutrition Discussed, Nutrition Reviewed, Decreasing sugar intake, Portion sizes, Fluid intake, Increaing proteins, Decreasing fats, Decreasing salt  Pharmacy Interventions   Pharmacy Dicussed/Reviewed Pharmacy Topics Discussed, Pharmacy Topics Reviewed, Medication Adherence, Affording Medications  Safety Interventions   Safety Discussed/Reviewed Safety Discussed, Safety Reviewed, Fall Risk  Advanced Directive Interventions   Advanced Directives Discussed/Reviewed Advanced Directives Discussed, Advanced Directives Reviewed     Active Listening & Reflection Utilized.  Verbalization of Feelings Encouraged.  Emotional Support Provided. Feelings of Loss of Independence Validated. Client-Centered Therapy Performed. Acceptance & Commitment Therapy Implemented. Cognitive Behavioral Therapy Initiated. Solution-Focused Strategies Enacted. Problem Solving Interventions Employed. Task-Centered Solutions Revised. Self-Enrollment in A Support Group of Interest Emphasized, from List Provided. Continue to Receive Simplified Nutritional Assistance Program Services (SNAP), through [facility] [phone]). Continue to Await Approval/Denial Letter from Cherene Altes, Medicaid Case Worker with the [place] [phone]), Regarding Status of Medicaid Application. Contact CSW Directly (# [phone]), If Approved for Medicaid, through [facility] [place] [phone]) & CSW Will Assist with Completion of Application for [place] [facility] [phone]), for Processing.      Please call the care guide team at [phone] if you need to cancel or reschedule your appointment.   If you are experiencing a Mental Health or [place] or need someone to talk to, please call the Suicide  and Crisis Lifeline: 988 call the [place] National Suicide Prevention Lifeline: 859 558 4790 or TTY: 609-098-1941 TTY (236)668-7468) to talk to a trained counselor call 1-800-273-TALK (toll free, 24 hour hotline) go to [facility] [address] [place] [phone]) call the [place]: [phone] call 911  Patient verbalizes understanding of instructions  and care plan provided today and agrees to view in [place]. Active MyChart status and patient understanding of how to access instructions and care plan via MyChart confirmed with patient.     No further follow up required.  [name], BSW, MSW, LCSW  Licensed Education officer, [facility]  [address] [place] [address], [place], [place] [address] Physical Address-[address] [place], [place] [address] Toll Free Main # [phone] Fax # [phone] Cell # [phone] [email or web address]

## 2022-10-05 ENCOUNTER — Other Ambulatory Visit: Payer: Self-pay | Admitting: Family Medicine

## 2022-10-08 NOTE — Progress Notes (Unsigned)
[facility]  Telephone:([phone]) [phone] Fax:(336) [phone]   Name: [name] Date: 10/08/2022 MRN: [id number]  DOB: December 10, 1950  Patient Care Team: [name], MD as PCP - General (Family Medicine) Larey Dresser, MD as Consulting Physician (Cardiology) Benay Pike, MD as Consulting Physician (Hematology and Oncology)    I connected with [name] on 10/08/22 at 10:00 AM EST by phone and verified that I am speaking with the correct person using two identifiers.   I discussed the limitations, risks, security and privacy concerns of performing an evaluation and management service by telemedicine and the availability of in-person appointments. I also discussed with the patient that there may be a patient responsible charge related to this service. The patient expressed understanding and agreed to proceed.   Other persons participating in the visit and their role in the encounter: N/A   Patient's location: home  Provider's location: [facility]   Chief Complaint: f/u of symptom management    INTERVAL HISTORY: [name] is a 72 y.o. female with  medical history including metastatic breast cancer with numerous pulmonary nodules bilaterally s/p chemotherapy currently on herceptin for maintenance. Now with complaints of uncontrolled back pain.  Palliative ask to see for symptom management.   SOCIAL HISTORY:     reports that she quit smoking about 54 years ago. Her smoking use included cigarettes. She has a 15.00 pack-year smoking history. She has been exposed to tobacco smoke. She has never used smokeless tobacco. She reports that she does not drink alcohol and does not use drugs.  ADVANCE DIRECTIVES:    CODE STATUS:   PAST MEDICAL HISTORY: Past Medical History:  Diagnosis Date   Arthritis    Back pain    Breast cancer ([place]) dx'd 11/2012   left   Chest pain    COPD (chronic obstructive pulmonary disease) ([place])    Diabetes  mellitus without complication ([place]) 5/63/8756   Ear pain    Fatty liver 6/03   Hypertension    Lung disease    Lung metastases dx'd 11/2012   Obesity, unspecified    Other abnormal glucose    Suicide attempt ([place]) 1996   Syncope and collapse    Unspecified sleep apnea     ALLERGIES:  is allergic to meperidine hcl, penicillins, and aspirin.  MEDICATIONS:  Current Outpatient Medications  Medication Sig Dispense Refill   albuterol (VENTOLIN HFA) 108 (90 Base) MCG/ACT inhaler INHALE 2 PUFFS INTO THE LUNGS EVERY 6 HOURS AS NEEDED FOR WHEEZE 8.5 each 1   Blood Glucose Monitoring Suppl (ONE TOUCH ULTRA 2) w/Device KIT 1 kit by Does not apply route QID. ICD 10-code: E11.49. 1 kit 0   insulin glargine (LANTUS SOLOSTAR) 100 UNIT/ML Solostar Pen Inject 18 Units into the skin daily.     insulin lispro (HUMALOG KWIKPEN) 100 UNIT/ML KwikPen Inject 6-10 Units into the skin 3 (three) times daily. Inject 10 u subq three times a day with meals     Insulin Pen Needle (PEN NEEDLES) 30G X 8 MM MISC Please use to inject insulin 4 times daily. 200 each 3   ketoconazole (NIZORAL) 2 % cream Apply 1 application. topically daily. (Patient not taking: Reported on 06/05/2022) 15 g 0   letrozole (FEMARA) 2.5 MG tablet Take 1 tablet (2.5 mg total) by mouth daily. 90 tablet 12   loperamide (IMODIUM) 2 MG capsule TAKE 1 CAPSULE (2 MG TOTAL) BY MOUTH AS NEEDED FOR DIARRHEA OR LOOSE  STOOLS. 30 capsule 1   ondansetron (ZOFRAN) 8 MG tablet TAKE 1 TABLET BY MOUTH EVERY 8 HOURS AS NEEDED FOR NAUSEA OR VOMITING. 20 tablet 0   ONETOUCH ULTRA test strip USE AS INSTRUCTED TO CHECK ONCE DAILY 100 strip 12   OXYGEN Inhale into the lungs.     Semaglutide, 2 MG/DOSE, (OZEMPIC, 2 MG/DOSE,) 8 MG/3ML SOPN Inject 2 mg into the skin once a week. 3 mL 6   No current facility-administered medications for this visit.    VITAL SIGNS: There were no vitals taken for this visit. There were no vitals filed for this visit.  Estimated body  mass index is 52.05 kg/m as calculated from the following:   Height as of 04/10/22: 5\' 4"  (1.626 m).   Weight as of 09/25/22: 303 lb 4 oz (137.6 kg).   PERFORMANCE STATUS (ECOG) : 1 - Symptomatic but completely ambulatory  IMPRESSION: I connected by phone with Ms. Kohut for symptom follow-up. No acute distress noted.   Neoplasm/Chronic/Neuropathic pain Ms. Brar's pain is well controlled on oxycodone.   We discussed her use of Oxy IR as needed for pain. She is tolerating well. Taking medication as prescribed.   We will continue to closely monitor and adjust medications accordingly.    PLAN: Oxy IR as needed for breakthrough pain Declines to restart or try different medication focused on long-acting pain relief due to potential symptoms.  Valium twice daily as needed for anxiety   Miralax daily for bowel regimen Ongoing goals of care discussions and support I will plan to see her back in the office in 4-6 weeks in collaboration with her other oncology appointments. Sooner if needed.    Patient expressed understanding and was in agreement with this plan. She also understands that She can call the clinic at any time with any questions, concerns, or complaints.       Any controlled substances utilized were prescribed in the context of palliative care. PDMP has been reviewed.    Time Total: 30 min  Visit consisted of counseling and education dealing with the complex and emotionally intense issues of symptom management and palliative care in the setting of serious and potentially life-threatening illness.Greater than 50%  of this time was spent counseling and coordinating care related to the above assessment and plan.  Alda Lea, AGPCNP-BC  Palliative Medicine Team/ [place]

## 2022-10-09 ENCOUNTER — Encounter: Payer: Self-pay | Admitting: Nurse Practitioner

## 2022-10-09 ENCOUNTER — Inpatient Hospital Stay: Payer: 59 | Admitting: Nurse Practitioner

## 2022-10-09 NOTE — Progress Notes (Signed)
Pt did not return call for phone visit. Appt canceled

## 2022-10-10 ENCOUNTER — Encounter: Payer: Self-pay | Admitting: Hematology and Oncology

## 2022-10-10 DIAGNOSIS — I5020 Unspecified systolic (congestive) heart failure: Secondary | ICD-10-CM | POA: Diagnosis not present

## 2022-10-10 DIAGNOSIS — U071 COVID-19: Secondary | ICD-10-CM | POA: Diagnosis not present

## 2022-10-10 DIAGNOSIS — M25569 Pain in unspecified knee: Secondary | ICD-10-CM | POA: Diagnosis not present

## 2022-10-10 DIAGNOSIS — R062 Wheezing: Secondary | ICD-10-CM | POA: Diagnosis not present

## 2022-10-11 DIAGNOSIS — I5020 Unspecified systolic (congestive) heart failure: Secondary | ICD-10-CM | POA: Diagnosis not present

## 2022-10-11 DIAGNOSIS — R062 Wheezing: Secondary | ICD-10-CM | POA: Diagnosis not present

## 2022-10-11 DIAGNOSIS — U071 COVID-19: Secondary | ICD-10-CM | POA: Diagnosis not present

## 2022-10-11 DIAGNOSIS — M25569 Pain in unspecified knee: Secondary | ICD-10-CM | POA: Diagnosis not present

## 2022-10-14 ENCOUNTER — Telehealth: Payer: Self-pay | Admitting: Family Medicine

## 2022-10-14 NOTE — Telephone Encounter (Signed)
Jonni Sanger a Pharmacist with [facility] called reporting that he thinks the patient should be on a statin.   Thanks!

## 2022-10-17 ENCOUNTER — Other Ambulatory Visit (HOSPITAL_COMMUNITY): Payer: Self-pay

## 2022-10-21 ENCOUNTER — Telehealth (INDEPENDENT_AMBULATORY_CARE_PROVIDER_SITE_OTHER): Payer: 59 | Admitting: Pharmacist

## 2022-10-21 DIAGNOSIS — E1149 Type 2 diabetes mellitus with other diabetic neurological complication: Secondary | ICD-10-CM

## 2022-10-21 DIAGNOSIS — I10 Essential (primary) hypertension: Secondary | ICD-10-CM

## 2022-10-21 NOTE — Assessment & Plan Note (Signed)
Dizziness and Weakness in patient not taking any blood pressure lowering agent.  Reports low diastolic readings with wrist blood pressure monitor and symptoms of worsening weakness and dizziness. - Scheduled visit with PCP Dr. Erin Hearing with first available opening - 3/19

## 2022-10-21 NOTE — Patient Instructions (Signed)
Diabetes well controlled based on CGM with no low blood sugar readings or symptoms.   Continue current regimen with no adjustment. - Lantus (insulin glargine) 18 units daily.  - Humalog Kwikpen (insulin lispro) 8-10units BID-TID with meals. - Ozempic (semaglutide) '2mg'$  once weekly.    - Scheduled visit with PCP Dr. Erin Hearing with first available opening - 3/19

## 2022-10-21 NOTE — Progress Notes (Signed)
    S:     Chief Complaint  Patient presents with   Medication Management    Diabetes - Dizziness/weakness   72 y.o. female who presents via virtual video visit for diabetes evaluation, education, and management.  PMH is significant for T2DM cancer, immobility, pain, and weakness. Patient was referred and last seen by Primary Care Provider, Dr. Erin Hearing, on 04/08/2022.  Last video visit with pharmacist: 08/21/2022 and last telephone visit 09/22/2022  At last visit, patient was found to have well controlled glucose readings based on CGM. Importantly, she had NO low readings or symptomatic low symptoms.   Today, patient presents on video in good spirits and appears comfortable during interaction.    Current diabetes medications include:  - Lantus (insulin glargine) 18 units daily.  - Humalog Kwikpen (insulin lispro) 8-10units BID-TID with meals. - Ozempic (semaglutide) '2mg'$  once weekly.   Patient reports adherence to taking above medications as prescribed.   Do you feel that your medications are working for you? yes Have you been experiencing any side effects to the medications prescribed? no Do you have any problems obtaining medications due to transportation or finances? no Insurance coverage: [employer]  Patient denies hypoglycemic events.  Reported home fasting blood sugars: Pt uses CGM to monitor blood sugar levels  Patient-reported exercise habits: minimal other then ADLs  O:   Review of Systems  Musculoskeletal:  Positive for back pain and joint pain.  Neurological:  Positive for dizziness and weakness.  Reported Weakness as "Weakest ever been"  Physical Exam Pulmonary:     Effort: Pulmonary effort is normal.  Neurological:     Mental Status: She is alert.  Psychiatric:        Mood and Affect: Mood normal.        Thought Content: Thought content normal.   Wearing Oxygen throughout entire video visit.  CGM Download:  % Time CGM is active:  90% Average Glucose: 149 mg/dL Glucose Management Indicator: 6.9%  Glucose Variability: 17.5% (goal <36%) Time in Goal:  - Time in range 70-180: 88% - Time above range: 12% - Time below range: 0%    Lab Results  Component Value Date   HGBA1C 6.3 04/08/2022    Home wrist cuff blood pressure readings:  systolic readings [id number] and diastolic readings [id number].   A/P: Diabetes well controlled based on CGM with no low blood sugar readings or symptoms. Patient is able to verbalize appropriate hypoglycemia management plan. Medication adherence appears good.  Continue current regimen with no adjustment. - Lantus (insulin glargine) 18 units daily.  - Humalog Kwikpen (insulin lispro) 8-10units BID-TID with meals. - Ozempic (semaglutide) '2mg'$  once weekly.  -Counseled on s/sx of and management of hypoglycemia.  -Next A1c anticipated at upcoming in-person visit with Dr. Erin Hearing - PCP.   Dizziness and Weakness in patient not taking any blood pressure lowering agent.  Reports low diastolic readings with wrist blood pressure monitor and symptoms of worsening weakness and dizziness. - Scheduled visit with PCP Dr. Erin Hearing with first available opening - 3/19  Written patient instructions provided. Patient verbalized understanding of treatment plan.  Total time in face to face counseling 30 minutes.    Follow-up:  Pharmacist None planned at this time. TBD by PCP - Dr. Erin Hearing. PCP clinic visit 3/19 at 11:30 AM.  Patient seen with Louanne Belton PharmD PGY-1 Pharmacy Resident and Estelle June, PharmD Candidate.

## 2022-10-21 NOTE — Assessment & Plan Note (Signed)
Diabetes well controlled based on CGM with no low blood sugar readings or symptoms. Patient is able to verbalize appropriate hypoglycemia management plan. Medication adherence appears good.  Continue current regimen with no adjustment. - Lantus (insulin glargine) 18 units daily.  - Humalog Kwikpen (insulin lispro) 8-10units BID-TID with meals. - Ozempic (semaglutide) '2mg'$  once weekly.  -Counseled on s/sx of and management of hypoglycemia.  -Next A1c anticipated at upcoming in-person visit with Dr. Erin Hearing - PCP.

## 2022-10-22 NOTE — Progress Notes (Signed)
Reviewed and agree with Dr Graylin Shiver plan.

## 2022-10-23 ENCOUNTER — Inpatient Hospital Stay: Payer: 59

## 2022-10-23 ENCOUNTER — Inpatient Hospital Stay (HOSPITAL_BASED_OUTPATIENT_CLINIC_OR_DEPARTMENT_OTHER): Payer: 59 | Admitting: Adult Health

## 2022-10-23 ENCOUNTER — Inpatient Hospital Stay: Payer: 59 | Admitting: Nurse Practitioner

## 2022-10-23 ENCOUNTER — Encounter: Payer: Self-pay | Admitting: Adult Health

## 2022-10-23 ENCOUNTER — Inpatient Hospital Stay: Payer: 59 | Attending: Oncology

## 2022-10-23 VITALS — BP 146/63 | HR 66 | Temp 97.8°F | Resp 18 | Wt 305.4 lb

## 2022-10-23 DIAGNOSIS — Z79811 Long term (current) use of aromatase inhibitors: Secondary | ICD-10-CM | POA: Diagnosis not present

## 2022-10-23 DIAGNOSIS — M549 Dorsalgia, unspecified: Secondary | ICD-10-CM | POA: Insufficient documentation

## 2022-10-23 DIAGNOSIS — E119 Type 2 diabetes mellitus without complications: Secondary | ICD-10-CM | POA: Insufficient documentation

## 2022-10-23 DIAGNOSIS — Z5112 Encounter for antineoplastic immunotherapy: Secondary | ICD-10-CM | POA: Insufficient documentation

## 2022-10-23 DIAGNOSIS — C7800 Secondary malignant neoplasm of unspecified lung: Secondary | ICD-10-CM | POA: Insufficient documentation

## 2022-10-23 DIAGNOSIS — C50919 Malignant neoplasm of unspecified site of unspecified female breast: Secondary | ICD-10-CM

## 2022-10-23 DIAGNOSIS — Z170 Estrogen receptor positive status [ER+]: Secondary | ICD-10-CM

## 2022-10-23 DIAGNOSIS — Z87891 Personal history of nicotine dependence: Secondary | ICD-10-CM | POA: Insufficient documentation

## 2022-10-23 DIAGNOSIS — C50212 Malignant neoplasm of upper-inner quadrant of left female breast: Secondary | ICD-10-CM | POA: Diagnosis not present

## 2022-10-23 DIAGNOSIS — G8929 Other chronic pain: Secondary | ICD-10-CM | POA: Diagnosis not present

## 2022-10-23 DIAGNOSIS — Z86718 Personal history of other venous thrombosis and embolism: Secondary | ICD-10-CM | POA: Insufficient documentation

## 2022-10-23 DIAGNOSIS — I10 Essential (primary) hypertension: Secondary | ICD-10-CM | POA: Insufficient documentation

## 2022-10-23 DIAGNOSIS — Z95828 Presence of other vascular implants and grafts: Secondary | ICD-10-CM

## 2022-10-23 LAB — CMP (CANCER CENTER ONLY)
ALT: 61 U/L — ABNORMAL HIGH (ref 0–44)
AST: 52 U/L — ABNORMAL HIGH (ref 15–41)
Albumin: 3.7 g/dL (ref 3.5–5.0)
Alkaline Phosphatase: 79 U/L (ref 38–126)
Anion gap: 5 (ref 5–15)
BUN: 15 mg/dL (ref 8–23)
CO2: 32 mmol/L (ref 22–32)
Calcium: 9.4 mg/dL (ref 8.9–10.3)
Chloride: 103 mmol/L (ref 98–111)
Creatinine: 0.75 mg/dL (ref 0.44–1.00)
GFR, Estimated: >60 mL/min (ref >60)
Glucose, Bld: 150 mg/dL — ABNORMAL HIGH (ref 70–99)
Potassium: 4.2 mmol/L (ref 3.5–5.1)
Sodium: 140 mmol/L (ref 135–145)
Total Bilirubin: 0.5 mg/dL (ref 0.3–1.2)
Total Protein: 7.2 g/dL (ref 6.5–8.1)

## 2022-10-23 LAB — CBC WITH DIFFERENTIAL (CANCER CENTER ONLY)
Abs Immature Granulocytes: 0.03 10*3/uL (ref 0.00–0.07)
Basophils Absolute: 0 10*3/uL (ref 0.0–0.1)
Basophils Relative: 0 %
Eosinophils Absolute: 0.1 10*3/uL (ref 0.0–0.5)
Eosinophils Relative: 1 %
HCT: 39.1 % (ref 36.0–46.0)
Hemoglobin: 13.1 g/dL (ref 12.0–15.0)
Immature Granulocytes: 0 %
Lymphocytes Relative: 31 %
Lymphs Abs: 2.1 10*3/uL (ref 0.7–4.0)
MCH: 29.7 pg (ref 26.0–34.0)
MCHC: 33.5 g/dL (ref 30.0–36.0)
MCV: 88.7 fL (ref 80.0–100.0)
Monocytes Absolute: 0.4 10*3/uL (ref 0.1–1.0)
Monocytes Relative: 6 %
Neutro Abs: 4 10*3/uL (ref 1.7–7.7)
Neutrophils Relative %: 62 %
Platelet Count: 237 10*3/uL (ref 150–400)
RBC: 4.41 MIL/uL (ref 3.87–5.11)
RDW: 13.6 % (ref 11.5–15.5)
WBC Count: 6.7 10*3/uL (ref 4.0–10.5)
nRBC: 0 % (ref 0.0–0.2)

## 2022-10-23 MED ORDER — LORAZEPAM 2 MG/ML IJ SOLN
0.5000 mg | Freq: Once | INTRAMUSCULAR | Status: AC
  Administered 2022-10-23: 0.5 mg via INTRAVENOUS
  Filled 2022-10-23: qty 1

## 2022-10-23 MED ORDER — HEPARIN SOD (PORK) LOCK FLUSH 100 UNIT/ML IV SOLN
500.0000 [IU] | Freq: Once | INTRAVENOUS | Status: AC | PRN
  Administered 2022-10-23: 500 [IU]

## 2022-10-23 MED ORDER — SODIUM CHLORIDE 0.9% FLUSH
10.0000 mL | Freq: Once | INTRAVENOUS | Status: AC
  Administered 2022-10-23: 10 mL via INTRAVENOUS

## 2022-10-23 MED ORDER — ACETAMINOPHEN 325 MG PO TABS
650.0000 mg | ORAL_TABLET | Freq: Once | ORAL | Status: AC
  Administered 2022-10-23: 650 mg via ORAL
  Filled 2022-10-23: qty 2

## 2022-10-23 MED ORDER — SODIUM CHLORIDE 0.9% FLUSH
10.0000 mL | Freq: Once | INTRAVENOUS | Status: AC
  Administered 2022-10-23: 10 mL

## 2022-10-23 MED ORDER — DIPHENHYDRAMINE HCL 25 MG PO CAPS
25.0000 mg | ORAL_CAPSULE | Freq: Once | ORAL | Status: AC
  Administered 2022-10-23: 25 mg via ORAL
  Filled 2022-10-23: qty 1

## 2022-10-23 MED ORDER — TRASTUZUMAB-ANNS CHEMO 150 MG IV SOLR
750.0000 mg | Freq: Once | INTRAVENOUS | Status: AC
  Administered 2022-10-23: 750 mg via INTRAVENOUS
  Filled 2022-10-23: qty 35.71

## 2022-10-23 MED ORDER — SODIUM CHLORIDE 0.9 % IV SOLN: Freq: Once | INTRAVENOUS | Status: AC

## 2022-10-23 NOTE — Progress Notes (Signed)
Received secure chat from [name], NP stating that Pt was never scheduled for CT chest/abd/pelvis ordered on 02/08. Called central scheduling and scheduled Pt for 03/25 at 1530. Relayed message to Pt who states she is unavailable at that time. Gave Pt number for central scheduling for her to reschedule. Instructed Pt that when she is rescheduled, she has to be NPO 4 hours prior to appt time and to arrive 30 min prior to appt time. Pt verbalized understanding.

## 2022-10-23 NOTE — Progress Notes (Signed)
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 [facility]) Staging form: Breast, AJCC 7th Edition - Clinical: Stage IV ([place], NX, M1) - Signed by Jenna Cruel, MD on 04/17/2015  Malignant neoplasm of upper-inner quadrant of left breast in female, estrogen receptor positive ([name]) Staging form: Breast, AJCC 7th Edition - Clinical: Stage IA (T1c, N0, cM0) - Unsigned Specimen type: Core Needle Biopsy Histopathologic type: 9931 Laterality: Left Staging comments: Staged at breast conference 4.2.14  - Pathologic: Stage IV (M1) - Unsigned Specimen type: Core Needle Biopsy Histopathologic type: 9931 Laterality: Left   SUMMARY OF ONCOLOGIC HISTORY: 72 y.o. [name] woman with stage IV breast cancer initially diagnosed March 2014    (1) s/p left breast upper inner quadrant biopsy 11/05/2012 for a clinical T1c NX M1, stage IV invasive ductal carcinoma, grade 3, estrogen and progesterone receptor negative, with an MIB-1 of 77%, and HER-2 amplified by CISH with a ratio of 4.39.             (a) mammography 04/16/2016 shows the left breast mass to have nearly completely resolved   (2) chest, abdomen and pelvis CT scans and PET scan April 2014 showed multiple bilateral pulmonaru nodules but no liver or bone involvement; biopsy of a pulmonary nodule on 11/30/2012 confirmed metastatic breast cancer.              (a) CT in GI obtained 09/28/2014 shows no measurable disease in the lungs             (b) chest CT 12/20/2015 showed stable small right lung pulmonary nodules and an area in the right lower lobe pleural parenchymal thickness requiring attention in future studies              (3) received docetaxel / trastuzumab/ pertuzumab x4, completed 02/07/2013, with a good response,    (4) trastuzumab/ pertuzumab continued every 21 days;             (a) Pertuzumab  discontinued after 07/28/2016, and Trastuzumab given every 4 weeks starting 08/2016             (b) most recent echocardiogram 06/28/2018 shows EF of 60-65% (these will be every 6 months)   (5) anastrozole started 02/15/2013, discontinued October 2014 with poor tolerance   (6) 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   CURRENT THERAPY: Letrozole, Herceptin  INTERVAL HISTORY: [name] 72 y.o. female returns for follow-up prior to receiving Herceptin therapy.  She is tolerating it well and her most recent echo occurred in December 2023 demonstrating a left ventricular ejection fraction of 60 to 65%.  She is taking letrozole daily with good tolerance.  She continues to struggle with her chronic back pain and tells me that she is following up with Dr. Erin Hearing for help with this.  She tells me that the Lyrica/Pregabalin and the Oxycodone palliative care prescribed did not help her.   We ordered CT chest/abd/pelvis to be completed prior to today's visit, however this was not yet scheduled.   Patient Active Problem List   Diagnosis Date Noted   Sialadenitis    Hypoxia  Port-A-Cath in place 03/21/2020   Obesity, morbid, BMI 50 or higher ([place]) 10/02/2015   Diabetes mellitus type 2 with neurological manifestations ([facility]) 09/28/2014   Malignant neoplasm of upper-inner quadrant of left breast in female, estrogen receptor positive ([name]) 05/23/2013   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 ([name]) 12/14/2012   Anxiety state 10/15/2006   HYPERTENSION, BENIGN SYSTEMIC 10/15/2006   Osteoarthritis of spine 10/15/2006   LUMBAR SPINAL STENOSIS 10/15/2006   Sleep apnea 10/15/2006    is allergic to meperidine hcl, penicillins, and aspirin.  MEDICAL HISTORY: Past Medical  History:  Diagnosis Date   Arthritis    Back pain    Breast cancer ([facility]) dx'd 11/2012   left   Chest pain    COPD (chronic obstructive pulmonary disease) (HCC)    Diabetes mellitus without complication ([name]) [id number]   Ear pain    Fatty liver 6/03   Hypertension    Lung disease    Lung metastases dx'd 11/2012   Obesity, unspecified    Other abnormal glucose    Suicide attempt ([name]) 1996   Syncope and collapse    Unspecified sleep apnea     SURGICAL HISTORY: Past Surgical History:  Procedure Laterality Date   CARDIAC CATHETERIZATION     2007   CHOLECYSTECTOMY     TUBAL LIGATION      SOCIAL HISTORY: Social History   Socioeconomic History   Marital status: Divorced    Spouse name: Not on file   Number of children: 4   Years of education: 12   Highest education level: 12th grade  Occupational History   Occupation: Disability   Occupation: retired-daycare  Tobacco Use   Smoking status: Former    Packs/day: 3.00    Years: 5.00    Total pack years: 15.00    Types: Cigarettes    Quit date: 08/18/1968    Years since quitting: 54.2    Passive exposure: Past   Smokeless tobacco: Never  Vaping Use   Vaping Use: Never used  Substance and Sexual Activity   Alcohol use: No    Alcohol/week: 0.0 standard drinks of alcohol   Drug use: No   Sexual activity: Not Currently    Partners: Male  Other Topics Concern   Not on file  Social History Narrative   Health Care POA:    Emergency Contact: [name] [phone]   End of Life Plan:    Who lives with you: two grandchildren, friend, daughter   Any pets: 3 poodles   Diet: Pt has a variety of protein, starch, vegetables.  Pt is currently working on cutting back on portions for weight loss.   Exercise: Pt has not regular exercise routine.  Occasionally walks around home.   Seatbelts: Pt reports wearing seatbelt occasionally.   Sun Exposure/Protection: Pt does not use sun protection   Hobbies: reading, playing on kindle,  ebay         Currently in her home she keeps her granddaughter [name], 46, who is the daughter of the patient's daughter [name] (the patient refers to [name] as "my adopted daughter"); grandson Jenna "[name]" Reichwein, [place], who is [name]'s half-brother; [name], and an [place] friend, [name], the patient's significant other. [name] is a Network engineer, currently unemployed. Son [name] works as an Clinical biochemist in [place]. Daughter [name] is currently in prison due to killing someone in a car accident. Daughter [name] died from aplastic anemia at the age of 62. The patient has a total  of 4 grandchildren. She is not a church attender   Social Determinants of Health   Financial Resource Strain: Medium Risk (07/30/2022)   Overall Financial Resource Strain (CARDIA)    Difficulty of Paying Living Expenses: Somewhat hard  Food Insecurity: No Food Insecurity (07/30/2022)   Hunger Vital Sign    Worried About Running Out of Food in the Last Year: Never true    [place] in the Last Year: Never true  Transportation Needs: No Transportation Needs (07/30/2022)   PRAPARE - Hydrologist (Medical): No    Lack of Transportation (Non-Medical): No  Physical Activity: Inactive (07/30/2022)   Exercise Vital Sign    Days of Exercise per Week: 0 days    Minutes of Exercise per Session: 0 min  Stress: No Stress Concern Present (07/30/2022)   [place]    Feeling of Stress : Only a little  Social Connections: Moderately Integrated (07/30/2022)   Social Connection and Isolation Panel [NHANES]    Frequency of Communication with Friends and Family: More than three times a week    Frequency of Social Gatherings with Friends and Family: More than three times a week    Attends Religious Services: More than 4 times per year    Active Member of [employer] or  Organizations: Yes    Attends Music therapist: More than 4 times per year    Marital Status: Divorced  Intimate Partner Violence: Not At Risk (07/30/2022)   Humiliation, Afraid, Rape, and Kick questionnaire    Fear of Current or Ex-Partner: No    Emotionally Abused: No    Physically Abused: No    Sexually Abused: No    FAMILY HISTORY: Family History  Problem Relation Age of Onset   Coronary artery disease Father 66   Diabetes Father    Heart disease Father    Breast cancer Mother 3   Cancer Mother 45       breast   Aplastic anemia Daughter        died at age 43   Cancer Maternal Aunt 49       ovarian   Cancer Maternal Grandmother 65       ovarian   Cancer Paternal Aunt 24       ovarian/breast/breast   Coronary artery disease Sister 58   Coronary artery disease Brother 61    Review of Systems  Constitutional:  Negative for appetite change, chills, fatigue, fever and unexpected weight change.  HENT:   Negative for hearing loss, lump/mass and trouble swallowing.   Eyes:  Negative for eye problems and icterus.  Respiratory:  Negative for chest tightness, cough and shortness of breath.   Cardiovascular:  Negative for chest pain, leg swelling and palpitations.  Gastrointestinal:  Negative for abdominal distention, abdominal pain, constipation, diarrhea, nausea and vomiting.  Endocrine: Negative for hot flashes.  Genitourinary:  Negative for difficulty urinating.   Musculoskeletal:  Positive for back pain. Negative for arthralgias.  Skin:  Negative for itching and rash.  Neurological:  Negative for dizziness, extremity weakness, headaches and numbness.  Hematological:  Negative for adenopathy. Does not bruise/bleed easily.  Psychiatric/Behavioral:  Negative for depression. The patient is not nervous/anxious.       PHYSICAL EXAMINATION  ECOG PERFORMANCE STATUS: 3 - Symptomatic, >50% confined to bed  Vitals:   10/23/22 1225 10/23/22 1244  BP: (!) 168/89  (!) 146/63  Pulse: 66  Resp: 18   Temp: 97.8 F (36.6 C)   SpO2: 98%     Physical Exam Constitutional:      General: She is not in acute distress.    Appearance: Normal appearance. She is not toxic-appearing.  HENT:     Head: Normocephalic and atraumatic.  Eyes:     General: No scleral icterus. Cardiovascular:     Rate and Rhythm: Normal rate and regular rhythm.     Pulses: Normal pulses.     Heart sounds: Normal heart sounds.  Pulmonary:     Effort: Pulmonary effort is normal.     Breath sounds: Normal breath sounds.  Abdominal:     General: Abdomen is flat. Bowel sounds are normal. There is no distension.     Palpations: Abdomen is soft.     Tenderness: There is no abdominal tenderness.     Comments: Difficult to examine due to body habitus   Musculoskeletal:        General: No swelling.     Cervical back: Neck supple.  Lymphadenopathy:     Cervical: No cervical adenopathy.  Skin:    General: Skin is warm and dry.     Findings: No rash.  Neurological:     General: No focal deficit present.     Mental Status: She is alert.  Psychiatric:        Mood and Affect: Mood normal.        Behavior: Behavior normal.     LABORATORY DATA:  CBC    Component Value Date/Time   WBC 6.7 10/23/2022 1201   WBC 8.0 05/10/2020 0447   RBC 4.41 10/23/2022 1201   HGB 13.1 10/23/2022 1201   HGB 12.8 08/05/2017 0927   HCT 39.1 10/23/2022 1201   HCT 39.8 08/05/2017 0927   PLT 237 10/23/2022 1201   PLT 267 08/05/2017 0927   MCV 88.7 10/23/2022 1201   MCV 86.7 08/05/2017 0927   MCH 29.7 10/23/2022 1201   MCHC 33.5 10/23/2022 1201   RDW 13.6 10/23/2022 1201   RDW 14.3 08/05/2017 0927   LYMPHSABS 2.1 10/23/2022 1201   LYMPHSABS 2.3 08/05/2017 0927   MONOABS 0.4 10/23/2022 1201   MONOABS 0.4 08/05/2017 0927   EOSABS 0.1 10/23/2022 1201   EOSABS 0.1 08/05/2017 0927   BASOSABS 0.0 10/23/2022 1201   BASOSABS 0.0 08/05/2017 0927    CMP     Component Value Date/Time   NA  140 10/23/2022 1201   NA 141 08/05/2017 0927   K 4.2 10/23/2022 1201   K 4.0 08/05/2017 0927   CL 103 10/23/2022 1201   CL 101 02/07/2013 1125   CO2 32 10/23/2022 1201   CO2 31 (H) 08/05/2017 0927   GLUCOSE 150 (H) 10/23/2022 1201   GLUCOSE 125 08/05/2017 0927   GLUCOSE 193 (H) 02/07/2013 1125   BUN 15 10/23/2022 1201   BUN 22.7 08/05/2017 0927   CREATININE 0.75 10/23/2022 1201   CREATININE 0.8 08/05/2017 0927   CALCIUM 9.4 10/23/2022 1201   CALCIUM 9.7 08/05/2017 0927   PROT 7.2 10/23/2022 1201   PROT 7.3 08/05/2017 0927   ALBUMIN 3.7 10/23/2022 1201   ALBUMIN 3.2 (L) 08/05/2017 0927   AST 52 (H) 10/23/2022 1201   AST 19 08/05/2017 0927   ALT 61 (H) 10/23/2022 1201   ALT 28 08/05/2017 0927   ALKPHOS 79 10/23/2022 1201   ALKPHOS 108 08/05/2017 0927   BILITOT 0.5 10/23/2022 1201   BILITOT 0.34 08/05/2017 0927   GFRNONAA >60  10/23/2022 1201   GFRAA >60 05/10/2020 0447   GFRAA >60 04/18/2020 1023       ASSESSMENT and THERAPY PLAN:   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  1.  Metastatic breast cancer: She has no clinical or radiographic signs of progression of her metastatic breast cancer.  Her echocardiograms occur every 6 months and she will be due for repeat echo in June, 2024.  I placed orders for this today.  I was hoping that she would have scans for [place] to review at today's visit however we called radiology scheduling and they attempted calling [place] 4 times to get her appointment scheduled--without any response.  2.  Chronic back pain: She will follow-up with her primary care provider about this moving forward.  She tells me that she is not taking any of the medications prescribed by palliative care.  I recommended that she continue on letrozole and receive Herceptin every 4 weeks.  I am hopeful she will answer the phone when scheduling calls her  to get her scan scheduled.  We will repeat her echocardiogram in June and we will see her during that month as well.    All questions were answered. The patient knows to call the clinic with any problems, questions or concerns. We can certainly see the patient much sooner if necessary.  Total encounter time:30 minutes*in face-to-face visit time, chart review, lab review, care coordination, order entry, and documentation of the encounter time.    [name], NP 10/23/22 2:15 PM Medical Oncology and Hematology [facility] [place], [place] [address] Tel. [phone]    Fax. [phone]  *Total Encounter Time as defined by the Centers for Medicare and Medicaid Services includes, in addition to the face-to-face time of a patient visit (documented in the note above) non-face-to-face time: obtaining and reviewing outside history, ordering and reviewing medications, tests or procedures, care coordination (communications with other health care professionals or caregivers) and documentation in the medical record.

## 2022-10-23 NOTE — Patient Instructions (Signed)
[name]  Discharge Instructions: Thank you for choosing [facility] to provide your oncology and hematology care.   If you have a lab appointment with the [facility], please go directly to the [facility] and check in at the registration area.   Wear comfortable clothing and clothing appropriate for easy access to any Portacath or PICC line.   We strive to give you quality time with your provider. You may need to reschedule your appointment if you arrive late (15 or more minutes).  Arriving late affects you and other patients whose appointments are after yours.  Also, if you miss three or more appointments without notifying the office, you may be dismissed from the clinic at the provider's discretion.      For prescription refill requests, have your pharmacy contact our office and allow 72 hours for refills to be completed.    Today you received the following chemotherapy and/or immunotherapy agents: Trastuzumab      To help prevent nausea and vomiting after your treatment, we encourage you to take your nausea medication as directed.  BELOW ARE SYMPTOMS THAT SHOULD BE REPORTED IMMEDIATELY: *FEVER GREATER THAN 100.4 F (38 C) OR HIGHER *CHILLS OR SWEATING *NAUSEA AND VOMITING THAT IS NOT CONTROLLED WITH YOUR NAUSEA MEDICATION *UNUSUAL SHORTNESS OF BREATH *UNUSUAL BRUISING OR BLEEDING *URINARY PROBLEMS (pain or burning when urinating, or frequent urination) *BOWEL PROBLEMS (unusual diarrhea, constipation, pain near the anus) TENDERNESS IN MOUTH AND THROAT WITH OR WITHOUT PRESENCE OF ULCERS (sore throat, sores in mouth, or a toothache) UNUSUAL RASH, SWELLING OR PAIN  UNUSUAL VAGINAL DISCHARGE OR ITCHING   Items with * indicate a potential emergency and should be followed up as soon as possible or go to the Emergency Department if any problems should occur.  Please show the CHEMOTHERAPY ALERT CARD or IMMUNOTHERAPY ALERT CARD at  check-in to the Emergency Department and triage nurse.  Should you have questions after your visit or need to cancel or reschedule your appointment, please contact [facility]  Dept: [phone]  and follow the prompts.  Office hours are 8:00 a.m. to 4:30 p.m. Monday - Friday. Please note that voicemails left after 4:00 p.m. may not be returned until the following business day.  We are closed weekends and major holidays. You have access to a nurse at all times for urgent questions. Please call the main number to the clinic Dept: [phone] and follow the prompts.   For any non-urgent questions, you may also contact your provider using MyChart. We now offer e-Visits for anyone 47 and older to request care online for non-urgent symptoms. For details visit [email or web address].   Also download the MyChart app! Go to the app store, search "MyChart", open the app, select [facility], and log in with your MyChart username and password.  Masks are optional in the cancer centers. If you would like for your care team to wear a mask while they are taking care of you, please let them know. You may have one support person who is at least 73 years old accompany you for your appointments.

## 2022-10-23 NOTE — Progress Notes (Deleted)
[facility]  Telephone:(336) [phone] Fax:(336) [phone]   Name: ROMAN SENGER Date: 10/23/2022 MRN: [id number]  DOB: [birth year]  Patient Care Team: [name], MD as PCP - General (Family Medicine) Larey Dresser, MD as Consulting Physician (Cardiology) Benay Pike, MD as Consulting Physician (Hematology and Oncology)     INTERVAL HISTORY: NATAJAH RYBINSKI is a 72 y.o. female with  medical history including metastatic breast cancer with numerous pulmonary nodules bilaterally s/p chemotherapy currently on herceptin for maintenance. Now with complaints of uncontrolled back pain.  Palliative ask to see for symptom management.   SOCIAL HISTORY:     reports that she quit smoking about 54 years ago. Her smoking use included cigarettes. She has a 15.00 pack-year smoking history. She has been exposed to tobacco smoke. She has never used smokeless tobacco. She reports that she does not drink alcohol and does not use drugs.  ADVANCE DIRECTIVES:    CODE STATUS:   PAST MEDICAL HISTORY: Past Medical History:  Diagnosis Date   Arthritis    Back pain    Breast cancer ([facility]) dx'd 11/2012   left   Chest pain    COPD (chronic obstructive pulmonary disease) ([facility])    Diabetes mellitus without complication ([facility]) [id number]   Ear pain    Fatty liver 6/03   Hypertension    Lung disease    Lung metastases dx'd 11/2012   Obesity, unspecified    Other abnormal glucose    Suicide attempt ([facility]) 1996   Syncope and collapse    Unspecified sleep apnea     ALLERGIES:  is allergic to meperidine hcl, penicillins, and aspirin.  MEDICATIONS:  Current Outpatient Medications  Medication Sig Dispense Refill   albuterol (VENTOLIN HFA) 108 (90 Base) MCG/ACT inhaler INHALE 2 PUFFS INTO THE LUNGS EVERY 6 HOURS AS NEEDED FOR WHEEZE (Patient not taking: Reported on 10/21/2022) 8.5 each 1   Blood Glucose Monitoring Suppl (ONE TOUCH ULTRA 2) w/Device KIT 1 kit by  Does not apply route QID. ICD 10-code: E11.49. (Patient not taking: Reported on 10/21/2022) 1 kit 0   insulin glargine (LANTUS SOLOSTAR) 100 UNIT/ML Solostar Pen Inject 18 Units into the skin daily.     insulin lispro (HUMALOG KWIKPEN) 100 UNIT/ML KwikPen Inject 6-10 Units into the skin 3 (three) times daily. Inject 10 u subq three times a day with meals     Insulin Pen Needle (PEN NEEDLES) 30G X 8 MM MISC Please use to inject insulin 4 times daily. 200 each 3   letrozole (FEMARA) 2.5 MG tablet Take 1 tablet (2.5 mg total) by mouth daily. 90 tablet 12   loperamide (IMODIUM) 2 MG capsule TAKE 1 CAPSULE (2 MG TOTAL) BY MOUTH AS NEEDED FOR DIARRHEA OR LOOSE STOOLS. (Patient not taking: Reported on 10/21/2022) 30 capsule 1   ondansetron (ZOFRAN) 8 MG tablet TAKE 1 TABLET BY MOUTH EVERY 8 HOURS AS NEEDED FOR NAUSEA OR VOMITING. (Patient not taking: Reported on 10/21/2022) 20 tablet 0   ONETOUCH ULTRA test strip USE AS INSTRUCTED TO CHECK ONCE DAILY (Patient not taking: Reported on 10/21/2022) 100 strip 12   OXYGEN Inhale into the lungs.     Semaglutide, 2 MG/DOSE, (OZEMPIC, 2 MG/DOSE,) 8 MG/3ML SOPN Inject 2 mg into the skin once a week. 3 mL 6   No current facility-administered medications for this visit.    VITAL SIGNS: There were no vitals taken for this visit. There were no vitals filed for  this visit.  Estimated body mass index is 52.05 kg/m as calculated from the following:   Height as of 04/10/22: '5\' 4"'$  (1.626 m).   Weight as of 09/25/22: 303 lb 4 oz (137.6 kg).   PERFORMANCE STATUS (ECOG) : 1 - Symptomatic but completely ambulatory  Assessment NAD, in wheelchair Normal breathing pattern AAO x3  IMPRESSION:   Neoplasm/Chronic/Neuropathic pain Ms. Hoyt's pain is well controlled on oxycodone.   We discussed her use of Oxy IR as needed for pain. She is tolerating well. Taking medication as prescribed.   We will continue to closely monitor and adjust medications accordingly.     PLAN: Oxy IR as needed for breakthrough pain Declines to restart or try different medication focused on long-acting pain relief due to potential symptoms.  Valium twice daily as needed for anxiety   Miralax daily for bowel regimen Ongoing goals of care discussions and support I will plan to see her back in the office in 4-6 weeks in collaboration with her other oncology appointments. Sooner if needed.    Patient expressed understanding and was in agreement with this plan. She also understands that She can call the clinic at any time with any questions, concerns, or complaints.       Any controlled substances utilized were prescribed in the context of palliative care. PDMP has been reviewed.    Time Total: 30 min  Visit consisted of counseling and education dealing with the complex and emotionally intense issues of symptom management and palliative care in the setting of serious and potentially life-threatening illness.Greater than 50%  of this time was spent counseling and coordinating care related to the above assessment and plan.  Alda Lea, AGPCNP-BC  Palliative Medicine Team/[facility]

## 2022-10-23 NOTE — Assessment & Plan Note (Signed)
[name] is a 72 year old woman with metastatic breast cancer to the lung noted in April 2014 (see oncologic history above).  1.  Metastatic breast cancer: She has no clinical or radiographic signs of progression of her metastatic breast cancer.  Her echocardiograms occur every 6 months and she will be due for repeat echo in June, 2024.  I placed orders for this today.  I was hoping that she would have scans for [place] to review at today's visit however we called radiology scheduling and they attempted calling [name] 4 times to get her appointment scheduled--without any response.  2.  Chronic back pain: She will follow-up with her primary care provider about this moving forward.  She tells me that she is not taking any of the medications prescribed by palliative care.  I recommended that she continue on letrozole and receive Herceptin every 4 weeks.  I am hopeful she will answer the phone when scheduling calls her to get her scan scheduled.  We will repeat her echocardiogram in June and we will see her during that month as well.

## 2022-10-24 ENCOUNTER — Other Ambulatory Visit: Payer: Self-pay | Admitting: Pharmacist

## 2022-10-24 ENCOUNTER — Telehealth: Payer: Self-pay | Admitting: Hematology and Oncology

## 2022-10-24 ENCOUNTER — Telehealth: Payer: Self-pay | Admitting: Family Medicine

## 2022-10-24 DIAGNOSIS — C50919 Malignant neoplasm of unspecified site of unspecified female breast: Secondary | ICD-10-CM

## 2022-10-24 NOTE — Telephone Encounter (Signed)
Per 3/7 LOS reached out to patient to schedule, patient aware of date and time of appointment.

## 2022-10-24 NOTE — Telephone Encounter (Signed)
Spoke with patient confirming upcoming appointment  

## 2022-10-25 DIAGNOSIS — E1149 Type 2 diabetes mellitus with other diabetic neurological complication: Secondary | ICD-10-CM | POA: Diagnosis not present

## 2022-10-28 ENCOUNTER — Other Ambulatory Visit: Payer: Self-pay | Admitting: Hematology and Oncology

## 2022-11-01 ENCOUNTER — Other Ambulatory Visit: Payer: Self-pay | Admitting: Hematology and Oncology

## 2022-11-04 ENCOUNTER — Other Ambulatory Visit: Payer: Self-pay

## 2022-11-04 ENCOUNTER — Ambulatory Visit (INDEPENDENT_AMBULATORY_CARE_PROVIDER_SITE_OTHER): Payer: 59 | Admitting: Family Medicine

## 2022-11-04 VITALS — BP 181/65 | HR 62 | Ht 64.0 in

## 2022-11-04 DIAGNOSIS — E1149 Type 2 diabetes mellitus with other diabetic neurological complication: Secondary | ICD-10-CM

## 2022-11-04 DIAGNOSIS — I10 Essential (primary) hypertension: Secondary | ICD-10-CM

## 2022-11-04 DIAGNOSIS — R42 Dizziness and giddiness: Secondary | ICD-10-CM | POA: Insufficient documentation

## 2022-11-04 LAB — POCT GLYCOSYLATED HEMOGLOBIN (HGB A1C): HbA1c, POC (controlled diabetic range): 5.8 % (ref 0.0–7.0)

## 2022-11-04 NOTE — Assessment & Plan Note (Signed)
Her home readings are good for the most part although her wrist cuff seems to run low compared to ours in the office.  Would not add any medications until the lightheadness episodes resolve

## 2022-11-04 NOTE — Patient Instructions (Signed)
Good to see you today - Thank you for coming in  Things we discussed today:  With the next episode -take your blood pressure and pulse and let me know - if does not improve after 5-10 minutes go to the ER   Nice work with the diabetes   You need an diabetes eye exam every year.  Please see your eye doctor.  Ask them to fax [place] a report of your exam  You need a mammogram to prevent breast cancer.  Please schedule an appointment.  You can call [phone].    You need a colonoscopy to prevent colon cancer.  I have placed a referral to the gastroenterologist's office.  They should call you within two weeks.  If they do not please let me know   Please always bring your medication bottles  Come back to see me in 3 months

## 2022-11-04 NOTE — Progress Notes (Signed)
    SUBJECTIVE:   CHIEF COMPLAINT / HPI:   Dizziness Weakness About 4 times in the last month when she returned from having a bowel movement she felt suddenly dizzy with black spots and very weak.  This resolved after a few minutes rest.  Never took pulse or blood pressure during episode Only happens when has bowel movement not when otherwise up walking.  No new chest pain or shortness of breath  No problems speaking during episodes Can normally only walk a few feet due to pain and obesity  10/2022 normal CBC   Diabetes No low blood sugar on CBG.      OBJECTIVE:   BP (!) 181/65   Pulse 62   Ht 5\' 4"  (1.626 m)   SpO2 100%   BMI 52.42 kg/m   Alert interactive Sitting in wheel chair  Heart - Regular rate and rhythm.  No murmurs, gallops or rubs.    Lungs:  Normal respiratory effort, chest expands symmetrically. Lungs are clear to auscultation, no crackles or wheezes.   ASSESSMENT/PLAN:   Diabetes mellitus type 2 with neurological manifestations [facility]) Assessment & Plan: Very well controlled.  Continue current regimen   Orders: -     POCT glycosylated hemoglobin (Hb A1C)  Lightheadedness Assessment & Plan: Sounds like she could be bradycardic or hypotensive perhaps due to vagal tone after bowel movement.  Will have her check her pulse and blood pressure with her home monitor.  Comparing her home wrist monitor with our blood pressure cuff her runs about 10 mm lower systolic.     HYPERTENSION, BENIGN SYSTEMIC Assessment & Plan: Her home readings are good for the most part although her wrist cuff seems to run low compared to ours in the office.  Would not add any medications until the lightheadness episodes resolve       Patient Instructions  Good to see you today - Thank you for coming in  Things we discussed today:  With the next episode -take your blood pressure and pulse and let me know - if does not improve after 5-10 minutes go to the ER   Nice work with  the diabetes   You need an diabetes eye exam every year.  Please see your eye doctor.  Ask them to fax [place] a report of your exam  You need a mammogram to prevent breast cancer.  Please schedule an appointment.  You can call [phone].    You need a colonoscopy to prevent colon cancer.  I have placed a referral to the gastroenterologist's office.  They should call you within two weeks.  If they do not please let me know   Please always bring your medication bottles  Come back to see me in 3 months   [name], [place]

## 2022-11-04 NOTE — Assessment & Plan Note (Signed)
Very well controlled. Continue current regimen. 

## 2022-11-04 NOTE — Assessment & Plan Note (Signed)
Sounds like she could be bradycardic or hypotensive perhaps due to vagal tone after bowel movement.  Will have her check her pulse and blood pressure with her home monitor.  Comparing her home wrist monitor with our blood pressure cuff her runs about 10 mm lower systolic.

## 2022-11-08 DIAGNOSIS — U071 COVID-19: Secondary | ICD-10-CM | POA: Diagnosis not present

## 2022-11-08 DIAGNOSIS — R062 Wheezing: Secondary | ICD-10-CM | POA: Diagnosis not present

## 2022-11-08 DIAGNOSIS — M25569 Pain in unspecified knee: Secondary | ICD-10-CM | POA: Diagnosis not present

## 2022-11-08 DIAGNOSIS — I5020 Unspecified systolic (congestive) heart failure: Secondary | ICD-10-CM | POA: Diagnosis not present

## 2022-11-09 DIAGNOSIS — M25569 Pain in unspecified knee: Secondary | ICD-10-CM | POA: Diagnosis not present

## 2022-11-09 DIAGNOSIS — U071 COVID-19: Secondary | ICD-10-CM | POA: Diagnosis not present

## 2022-11-09 DIAGNOSIS — I5020 Unspecified systolic (congestive) heart failure: Secondary | ICD-10-CM | POA: Diagnosis not present

## 2022-11-09 DIAGNOSIS — R062 Wheezing: Secondary | ICD-10-CM | POA: Diagnosis not present

## 2022-11-10 ENCOUNTER — Ambulatory Visit (HOSPITAL_COMMUNITY)
Admission: RE | Admit: 2022-11-10 | Discharge: 2022-11-10 | Disposition: A | Discharge status: Home / Self Care | Payer: 59 | Source: Ambulatory Visit | Attending: Adult Health | Admitting: Adult Health

## 2022-11-10 DIAGNOSIS — C50212 Malignant neoplasm of upper-inner quadrant of left female breast: Secondary | ICD-10-CM | POA: Diagnosis not present

## 2022-11-10 DIAGNOSIS — R918 Other nonspecific abnormal finding of lung field: Secondary | ICD-10-CM | POA: Diagnosis not present

## 2022-11-10 DIAGNOSIS — J449 Chronic obstructive pulmonary disease, unspecified: Secondary | ICD-10-CM | POA: Diagnosis not present

## 2022-11-10 DIAGNOSIS — R21 Rash and other nonspecific skin eruption: Secondary | ICD-10-CM | POA: Diagnosis not present

## 2022-11-10 DIAGNOSIS — Z170 Estrogen receptor positive status [ER+]: Secondary | ICD-10-CM | POA: Insufficient documentation

## 2022-11-10 DIAGNOSIS — K7460 Unspecified cirrhosis of liver: Secondary | ICD-10-CM | POA: Diagnosis not present

## 2022-11-10 DIAGNOSIS — G2581 Restless legs syndrome: Secondary | ICD-10-CM | POA: Diagnosis not present

## 2022-11-10 MED ORDER — HEPARIN SOD (PORK) LOCK FLUSH 100 UNIT/ML IV SOLN
500.0000 [IU] | Freq: Once | INTRAVENOUS | Status: AC
  Administered 2022-11-10: 500 [IU] via INTRAVENOUS

## 2022-11-10 MED ORDER — IOHEXOL 300 MG/ML  SOLN
100.0000 mL | Freq: Once | INTRAMUSCULAR | Status: AC | PRN
  Administered 2022-11-10: 100 mL via INTRAVENOUS

## 2022-11-13 ENCOUNTER — Other Ambulatory Visit (HOSPITAL_BASED_OUTPATIENT_CLINIC_OR_DEPARTMENT_OTHER): Payer: Self-pay

## 2022-11-13 ENCOUNTER — Other Ambulatory Visit: Payer: Self-pay | Admitting: Nurse Practitioner

## 2022-11-13 MED ORDER — ONDANSETRON HCL 8 MG PO TABS: 8.0000 mg | ORAL_TABLET | Freq: Three times a day (TID) | ORAL | 0 refills | Status: DC | PRN

## 2022-11-13 NOTE — Progress Notes (Signed)
Pharmacist Chemotherapy Monitoring - Initial Assessment    Anticipated start date: 11/20/22   The following has been reviewed per standard work regarding the patient's treatment regimen: The patient's diagnosis, treatment plan and drug doses, and organ/hematologic function Lab orders and baseline tests specific to treatment regimen  The treatment plan start date, drug sequencing, and pre-medications Prior authorization status  Patient's documented medication list, including drug-drug interaction screen and prescriptions for anti-emetics and supportive care specific to the treatment regimen The drug concentrations, fluid compatibility, administration routes, and timing of the medications to be used The patient's access for treatment and lifetime cumulative dose history, if applicable  The patient's medication allergies and previous infusion related reactions, if applicable   Changes made to treatment plan:  N/A  Follow up needed:  N/A   Larene Beach, RPH, 11/13/2022  2:58 PM

## 2022-11-13 NOTE — Telephone Encounter (Signed)
From: [name] To: Jobe Gibbon, NP Sent: 11/13/2022 12:17 PM EDT Subject: Medication Renewal Request  Refills have been requested for the following medications:   ondansetron (ZOFRAN) 8 MG tablet Chesley Noon [facility]]  Preferred pharmacy: [id number] - [place], [place] - [address] Delivery method: Arlyss Gandy

## 2022-11-14 ENCOUNTER — Encounter: Payer: Self-pay | Admitting: Family Medicine

## 2022-11-17 ENCOUNTER — Other Ambulatory Visit (HOSPITAL_BASED_OUTPATIENT_CLINIC_OR_DEPARTMENT_OTHER): Payer: Self-pay

## 2022-11-17 ENCOUNTER — Other Ambulatory Visit (HOSPITAL_COMMUNITY): Payer: Self-pay

## 2022-11-17 NOTE — Telephone Encounter (Signed)
I contacted [employer] for their best fax number for CGM supplies.   Office notes and the last 3 a1cs faxed to [phone].

## 2022-11-18 ENCOUNTER — Telehealth: Payer: Self-pay | Admitting: Family Medicine

## 2022-11-18 NOTE — Telephone Encounter (Signed)
Contacted [name] to schedule their annual wellness visit. Patient declined to schedule AWV at this time.  Patient said she sees a doctor every month and she's not interested.  Thank you,  [place] Direct dial  [phone]

## 2022-11-20 ENCOUNTER — Inpatient Hospital Stay: Payer: 59 | Attending: Oncology

## 2022-11-20 ENCOUNTER — Inpatient Hospital Stay (HOSPITAL_BASED_OUTPATIENT_CLINIC_OR_DEPARTMENT_OTHER): Payer: 59 | Admitting: Hematology and Oncology

## 2022-11-20 DIAGNOSIS — Z170 Estrogen receptor positive status [ER+]: Secondary | ICD-10-CM | POA: Diagnosis not present

## 2022-11-20 DIAGNOSIS — C50212 Malignant neoplasm of upper-inner quadrant of left female breast: Secondary | ICD-10-CM | POA: Diagnosis not present

## 2022-11-20 DIAGNOSIS — Z7901 Long term (current) use of anticoagulants: Secondary | ICD-10-CM | POA: Diagnosis not present

## 2022-11-20 DIAGNOSIS — Z79811 Long term (current) use of aromatase inhibitors: Secondary | ICD-10-CM | POA: Insufficient documentation

## 2022-11-20 DIAGNOSIS — E119 Type 2 diabetes mellitus without complications: Secondary | ICD-10-CM | POA: Diagnosis not present

## 2022-11-20 DIAGNOSIS — C50919 Malignant neoplasm of unspecified site of unspecified female breast: Secondary | ICD-10-CM

## 2022-11-20 DIAGNOSIS — C7800 Secondary malignant neoplasm of unspecified lung: Secondary | ICD-10-CM | POA: Diagnosis not present

## 2022-11-20 DIAGNOSIS — Z803 Family history of malignant neoplasm of breast: Secondary | ICD-10-CM | POA: Insufficient documentation

## 2022-11-20 DIAGNOSIS — Z87891 Personal history of nicotine dependence: Secondary | ICD-10-CM | POA: Insufficient documentation

## 2022-11-20 DIAGNOSIS — K8689 Other specified diseases of pancreas: Secondary | ICD-10-CM | POA: Insufficient documentation

## 2022-11-20 DIAGNOSIS — Z86718 Personal history of other venous thrombosis and embolism: Secondary | ICD-10-CM | POA: Diagnosis not present

## 2022-11-20 DIAGNOSIS — Z5112 Encounter for antineoplastic immunotherapy: Secondary | ICD-10-CM | POA: Insufficient documentation

## 2022-11-20 DIAGNOSIS — Z8041 Family history of malignant neoplasm of ovary: Secondary | ICD-10-CM | POA: Insufficient documentation

## 2022-11-20 MED ORDER — SODIUM CHLORIDE 0.9 % IV SOLN: Freq: Once | INTRAVENOUS | Status: AC

## 2022-11-20 MED ORDER — DIPHENHYDRAMINE HCL 25 MG PO CAPS
50.0000 mg | ORAL_CAPSULE | Freq: Once | ORAL | Status: AC
  Administered 2022-11-20: 50 mg via ORAL
  Filled 2022-11-20: qty 2

## 2022-11-20 MED ORDER — LORAZEPAM 2 MG/ML IJ SOLN
0.5000 mg | Freq: Once | INTRAMUSCULAR | Status: AC
  Administered 2022-11-20: 0.5 mg via INTRAVENOUS
  Filled 2022-11-20: qty 1

## 2022-11-20 MED ORDER — TRASTUZUMAB-ANNS CHEMO 150 MG IV SOLR
750.0000 mg | Freq: Once | INTRAVENOUS | Status: AC
  Administered 2022-11-20: 750 mg via INTRAVENOUS
  Filled 2022-11-20: qty 35.71

## 2022-11-20 MED ORDER — ACETAMINOPHEN 325 MG PO TABS
650.0000 mg | ORAL_TABLET | Freq: Once | ORAL | Status: AC
  Administered 2022-11-20: 650 mg via ORAL
  Filled 2022-11-20: qty 2

## 2022-11-20 NOTE — Progress Notes (Signed)
[facility] Cancer Follow up:    Jenna Living, MD [address] 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 [name] woman with stage IV breast cancer initially diagnosed Moss 2014    (1) s/p left breast upper inner quadrant biopsy 11/05/2012 for a clinical T1c NX M1, stage IV invasive ductal carcinoma, grade 3, estrogen and progesterone receptor negative, with an MIB-1 of 77%, and HER-2 amplified by CISH with a ratio of 4.39.             (a) mammography 04/16/2016 shows the left breast mass to have nearly completely resolved   (2) chest, abdomen and pelvis CT scans and PET scan April 2014 showed multiple bilateral pulmonaru nodules but no liver or bone involvement; biopsy of a pulmonary nodule on 11/30/2012 confirmed metastatic breast cancer.              (a) CT in GI obtained 09/28/2014 shows no measurable disease in the lungs             (b) chest CT 12/20/2015 showed stable small right lung pulmonary nodules and an area in the right lower lobe pleural parenchymal thickness requiring attention in future studies              (3) received docetaxel / trastuzumab/ pertuzumab x4, completed 02/07/2013, with a good response,    (4) trastuzumab/ pertuzumab continued every 21 days;             (a) Pertuzumab discontinued  after 07/28/2016, and Trastuzumab given every 4 weeks starting 08/2016             (b) most recent echocardiogram 06/28/2018 shows EF of 60-65% (these will be every 6 months)   (5) anastrozole started 02/15/2013, discontinued October 2014 with poor tolerance   (6) Left ulnar vein DVT documented Moss 2014, on Xarelto Moss 2014 to May 2015   (7) letrozole started 01/06/2014, interrupted mid 2019, resumed October 2019   (8)  if and when we documented disease progression we will change the letrozole to fulvestrant and Palbociclib.   CURRENT THERAPY: Letrozole, Herceptin  INTERVAL HISTORY:  [name] 72 y.o. female returns for follow-up prior to receiving Herceptin therapy.  She is here with her daughter and daughter in law. She today once more is mostly focused on her back pain, bone pains, how devastating the pain is. She apparently [place]. She also went through her imaging and is worried about the results.  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  MEDICAL HISTORY: Past Medical History:  Diagnosis Date   Arthritis    Back pain    Breast cancer (HCC) dx'd 11/2012   left   Chest pain    COPD (chronic obstructive pulmonary disease) (HCC)    Diabetes mellitus without complication (HCC) 09/28/2014   Ear pain    Fatty liver 6/03    Hypertension    Lung disease    Lung metastases dx'd 11/2012   Obesity, unspecified    Other abnormal glucose    Suicide attempt (HCC) 1996   Syncope and collapse    Unspecified sleep apnea     SURGICAL HISTORY: Past Surgical History:  Procedure Laterality Date   CARDIAC CATHETERIZATION     2007   CHOLECYSTECTOMY     TUBAL LIGATION      SOCIAL HISTORY: Social History   Socioeconomic History   Marital status: Divorced    Spouse name: Not on file   Number of children: 4   Years of education: 12   Highest education level: 12th grade  Occupational History   Occupation: Disability   Occupation: retired-daycare  Tobacco Use   Smoking status: Former    Packs/day: 3.00    Years: 5.00    Additional pack years: 0.00    Total pack years: 15.00    Types: Cigarettes    Quit date: 08/18/1968    Years since quitting: 54.2    Passive exposure: Past   Smokeless tobacco: Never  Vaping Use   Vaping Use: Never used  Substance and Sexual Activity   Alcohol use: No    Alcohol/week: 0.0 standard drinks of alcohol   Drug use: No   Sexual activity: Not Currently    Partners: Male  Other Topics Concern   Not on file  Social History Narrative   Health Care POA:    Emergency Contact: [name] [phone]   End of Life Plan:    Who lives with you: two grandchildren, friend, daughter   Any pets: 3 poodles   Diet: Pt has a variety of protein, starch, vegetables.  Pt is currently working on cutting back on portions for weight loss.   Exercise: Pt has not regular exercise routine.  Occasionally walks around home.   Seatbelts: Pt reports wearing seatbelt occasionally.   Sun Exposure/Protection: Pt does not use sun protection   Hobbies: reading, playing on kindle, ebay         Currently in her home she keeps her granddaughter [name], 16, who is the daughter of the patient's daughter [name] (the patient refers to [name] as "my adopted daughter"); grandson [name],  [place], who is [name]'s half-brother; daughter [name], and an [place] friend, [name], the patient's significant other. Daughter [name] is a Public librarian, currently unemployed. Son [name] "[employer] Moss works as an Personnel officer in [place]. Daughter [name] is currently in prison due to killing someone in a car accident. Daughter [name] died from aplastic anemia at the age of 54. The patient has a total of 4 grandchildren. She is not a church attender   Social Determinants of Health   Moss Resource Strain: Medium Risk (07/30/2022)   Overall Moss Resource Strain (CARDIA)    Difficulty of Paying Moss Expenses: Somewhat hard  Food  Insecurity: No Food Insecurity (07/30/2022)   Hunger Vital Sign    Worried About Running Out of Food in the Last Year: Never true    Ran Out of Food in the Last Year: Never true  Transportation Needs: No Transportation Needs (07/30/2022)   PRAPARE - Administrator, Civil Service (Medical): No    Lack of Transportation (Non-Medical): No  Physical Activity: Inactive (07/30/2022)   Exercise Vital Sign    Days of Exercise per Week: 0 days    Minutes of Exercise per Session: 0 min  Stress: No Stress Concern Present (07/30/2022)   [employer] of Occupational Health - Occupational Stress Questionnaire    Feeling of Stress : Only a little  Social Connections: Moderately Integrated (07/30/2022)   Social Connection and Isolation Panel [NHANES]    Frequency of Communication with Friends and Family: More than three times a week    Frequency of Social Gatherings with Friends and Family: More than three times a week    Attends Religious Services: More than 4 times per year    Active Member of [place] or Organizations: Yes    Attends Engineer, structural: More than 4 times per year    Marital Status: Divorced  Intimate Partner Violence: Not At Risk (07/30/2022)   Humiliation, Afraid, Rape, and Kick questionnaire    Fear  of Current or Ex-Partner: No    Emotionally Abused: No    Physically Abused: No    Sexually Abused: No    FAMILY HISTORY: Family History  Problem Relation Age of Onset   Coronary artery disease Father 63   Diabetes Father    Heart disease Father    Breast cancer Mother 11   Cancer Mother 53       breast   Aplastic anemia Daughter        died at age 8   Cancer Maternal Aunt 62       ovarian   Cancer Maternal Grandmother 70       ovarian   Cancer Paternal Aunt 27       ovarian/breast/breast   Coronary artery disease Sister 36   Coronary artery disease Brother 54    Review of Systems  Constitutional:  Negative for appetite change, chills, fatigue, fever and unexpected weight change.  HENT:   Negative for hearing loss, lump/mass and trouble swallowing.   Eyes:  Negative for eye problems and icterus.  Respiratory:  Negative for chest tightness, cough and shortness of breath.   Cardiovascular:  Negative for chest pain, leg swelling and palpitations.  Gastrointestinal:  Negative for abdominal distention, abdominal pain, constipation, diarrhea, nausea and vomiting.  Endocrine: Negative for hot flashes.  Genitourinary:  Negative for difficulty urinating.   Musculoskeletal:  Positive for back pain. Negative for arthralgias.  Skin:  Negative for itching and rash.  Neurological:  Negative for dizziness, extremity weakness, headaches and numbness.  Hematological:  Negative for adenopathy. Does not bruise/bleed easily.  Psychiatric/Behavioral:  Negative for depression. The patient is not nervous/anxious.       PHYSICAL EXAMINATION  ECOG PERFORMANCE STATUS: 3 - Symptomatic, >50% confined to bed  Vitals:   11/20/22 1454  BP: (!) 172/67  Pulse: 83  Resp: 16  Temp: 98.1 F (36.7 C)  SpO2: 99%    Physical Exam Constitutional:      General: She is not in acute distress.    Appearance: Normal appearance. She is not toxic-appearing.  HENT:     Head: Normocephalic and  atraumatic.  Eyes:     General: No scleral icterus. Cardiovascular:     Rate and Rhythm: Normal rate and regular rhythm.     Pulses: Normal pulses.     Heart sounds: Normal heart sounds.  Pulmonary:     Effort: Pulmonary effort is normal.     Breath sounds: Normal breath sounds.  Abdominal:     General: Abdomen is flat. Bowel sounds are normal. There is no distension.     Palpations: Abdomen is soft.     Tenderness: There is no abdominal tenderness.  Musculoskeletal:        General: No swelling.     Cervical back: Neck supple.  Lymphadenopathy:     Cervical: No cervical adenopathy.  Skin:    General: Skin is warm and dry.     Findings: No rash.  Neurological:     General: No focal deficit present.     Mental Status: She is alert.  Psychiatric:        Mood and Affect: Mood normal.        Behavior: Behavior normal.    LABORATORY DATA:  CBC    Component Value Date/Time   WBC 6.7 10/23/2022 1201   WBC 8.0 05/10/2020 0447   RBC 4.41 10/23/2022 1201   HGB 13.1 10/23/2022 1201   HGB 12.8 08/05/2017 0927   HCT 39.1 10/23/2022 1201   HCT 39.8 08/05/2017 0927   PLT 237 10/23/2022 1201   PLT 267 08/05/2017 0927   MCV 88.7 10/23/2022 1201   MCV 86.7 08/05/2017 0927   MCH 29.7 10/23/2022 1201   MCHC 33.5 10/23/2022 1201   RDW 13.6 10/23/2022 1201   RDW 14.3 08/05/2017 0927   LYMPHSABS 2.1 10/23/2022 1201   LYMPHSABS 2.3 08/05/2017 0927   MONOABS 0.4 10/23/2022 1201   MONOABS 0.4 08/05/2017 0927   EOSABS 0.1 10/23/2022 1201   EOSABS 0.1 08/05/2017 0927   BASOSABS 0.0 10/23/2022 1201   BASOSABS 0.0 08/05/2017 0927    CMP     Component Value Date/Time   NA 140 10/23/2022 1201   NA 141 08/05/2017 0927   K 4.2 10/23/2022 1201   K 4.0 08/05/2017 0927   CL 103 10/23/2022 1201   CL 101 02/07/2013 1125   CO2 32 10/23/2022 1201   CO2 31 (H) 08/05/2017 0927   GLUCOSE 150 (H) 10/23/2022 1201   GLUCOSE 125 08/05/2017 0927   GLUCOSE 193 (H) 02/07/2013 1125   BUN 15  10/23/2022 1201   BUN 22.7 08/05/2017 0927   CREATININE 0.75 10/23/2022 1201   CREATININE 0.8 08/05/2017 0927   CALCIUM 9.4 10/23/2022 1201   CALCIUM 9.7 08/05/2017 0927   PROT 7.2 10/23/2022 1201   PROT 7.3 08/05/2017 0927   ALBUMIN 3.7 10/23/2022 1201   ALBUMIN 3.2 (L) 08/05/2017 0927   AST 52 (H) 10/23/2022 1201   AST 19 08/05/2017 0927   ALT 61 (H) 10/23/2022 1201   ALT 28 08/05/2017 0927   ALKPHOS 79 10/23/2022 1201   ALKPHOS 108 08/05/2017 0927   BILITOT 0.5 10/23/2022 1201   BILITOT 0.34 08/05/2017 0927   GFRNONAA >60 10/23/2022 1201   GFRAA >60 05/10/2020 0447   GFRAA >60 04/18/2020 1023   ASSESSMENT and THERAPY PLAN:   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   1.  Metastatic breast cancer: Most recent scan with pre vascular adenopathy increasing in size  suggestive of progression. Since thi appears to be the only area for progression, we discussed that she could consider palliative radiation and continue herceptin maintenance.   2.  Chronic back pain: She will follow-up with her primary care provider about this moving forward. [place]  3. Low attenuation lesion in uncinate process of pancreas, possibly new. We discussed about MR abdomen. She refused it, she can't stay still with her back pain. We agreed to follow up on repeat CT imaging in about 4 months.   I recommended that she continue on letrozole and receive Herceptin every 4 weeks.    All questions were answered. The patient knows to call the clinic with any problems, questions or concerns. We can certainly see the patient much sooner if necessary.  Total encounter time:30 minutes*in face-to-face visit time, chart review, lab review, care coordination, order entry, and documentation of the encounter time.    *Total Encounter Time as  defined by the Centers for Medicare and Medicaid Services includes, in addition to the face-to-face time of a patient visit (documented in the note above) non-face-to-face time: obtaining and reviewing outside history, ordering and reviewing medications, tests or procedures, care coordination (communications with other health care professionals or caregivers) and documentation in the medical record.

## 2022-11-20 NOTE — Patient Instructions (Signed)
[facility]  Discharge Instructions: Thank you for choosing [facility] to provide your oncology and hematology care.   If you have a lab appointment with the Cancer Center, please go directly to the Cancer Center and check in at the registration area.   Wear comfortable clothing and clothing appropriate for easy access to any Portacath or PICC line.   We strive to give you quality time with your provider. You may need to reschedule your appointment if you arrive late (15 or more minutes).  Arriving late affects you and other patients whose appointments are after yours.  Also, if you miss three or more appointments without notifying the office, you may be dismissed from the clinic at the provider's discretion.      For prescription refill requests, have your pharmacy contact our office and allow 72 hours for refills to be completed.    Today you received the following chemotherapy and/or immunotherapy agents: Trastuzumab      To help prevent nausea and vomiting after your treatment, we encourage you to take your nausea medication as directed.  BELOW ARE SYMPTOMS THAT SHOULD BE REPORTED IMMEDIATELY: *FEVER GREATER THAN 100.4 F (38 C) OR HIGHER *CHILLS OR SWEATING *NAUSEA AND VOMITING THAT IS NOT CONTROLLED WITH YOUR NAUSEA MEDICATION *UNUSUAL SHORTNESS OF BREATH *UNUSUAL BRUISING OR BLEEDING *URINARY PROBLEMS (pain or burning when urinating, or frequent urination) *BOWEL PROBLEMS (unusual diarrhea, constipation, pain near the anus) TENDERNESS IN MOUTH AND THROAT WITH OR WITHOUT PRESENCE OF ULCERS (sore throat, sores in mouth, or a toothache) UNUSUAL RASH, SWELLING OR PAIN  UNUSUAL VAGINAL DISCHARGE OR ITCHING   Items with * indicate a potential emergency and should be followed up as soon as possible or go to the Emergency Department if any problems should occur.  Please show the CHEMOTHERAPY ALERT CARD or IMMUNOTHERAPY ALERT CARD at  check-in to the Emergency Department and triage nurse.  Should you have questions after your visit or need to cancel or reschedule your appointment, please contact [facility]  Dept: [phone]  and follow the prompts.  Office hours are 8:00 a.m. to 4:30 p.m. Monday - Friday. Please note that voicemails left after 4:00 p.m. may not be returned until the following business day.  We are closed weekends and major holidays. You have access to a nurse at all times for urgent questions. Please call the main number to the clinic Dept: [phone] and follow the prompts.   For any non-urgent questions, you may also contact your provider using MyChart. We now offer e-Visits for anyone 18 and older to request care online for non-urgent symptoms. For details visit [email or web address].   Also download the MyChart app! Go to the app store, search "MyChart", open the app, select [facility], and log in with your MyChart username and password.  Masks are optional in the cancer centers. If you would like for your care team to wear a mask while they are taking care of you, please let them know. You may have one support person who is at least 73 years old accompany you for your appointments. 

## 2022-11-21 ENCOUNTER — Ambulatory Visit: Payer: 59 | Admitting: Hematology and Oncology

## 2022-11-21 ENCOUNTER — Telehealth: Payer: Self-pay | Admitting: Radiation Oncology

## 2022-11-21 ENCOUNTER — Ambulatory Visit: Payer: 59

## 2022-11-21 ENCOUNTER — Encounter: Payer: Self-pay | Admitting: Hematology and Oncology

## 2022-11-21 NOTE — Telephone Encounter (Signed)
Pt returned call asking for 4/10 appt. Appt changed to 4/10 @ 1:30pm per pt request.

## 2022-11-21 NOTE — Telephone Encounter (Signed)
Contacted pt to schedule CON with Dr. Mitzi Hansen. Pt initially requested appt Monday or Tuesday for daughter to bring her. Pt was offered 4/16 as next available Tuesday. Pt inquired about sooner appt and was advised of sooner appt 4/10. Pt asked to c/b and confirm after she is able to secure transport for appt. I await c/b confirming which appt pt would like.

## 2022-11-24 NOTE — Progress Notes (Signed)
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   HISTORY OF PRESENT ILLNESS: [name] is a 72 y.o. female seen at the request of Dr. Al Pimple for a diagnosis of Stage IV breast cancer. Her breast cancer originally diagnosed as grade 3, ER/PR negative, HER2 amplified disease in March 2014.  Her disease that was metastatic was noted in the lungs bilaterally but no liver or pulmonary involvement was noted at that time.  She received Taxotere and anti HER2 based therapy which she completed in July 2014. She has continued anti Her2 therapy. She also had received antiestrogen therapy between July and October 2014, then started again in May 2015-mid 2019. Antiestrogen therapy was restarted in October 2019 which she continues.   She was recently found on CT scan on 11/10/22 that showed an increase in a prevascular lymph node measuring 2.9 cm. there has also been stable lesion in the uncinate process that has been followed over the years and overall unchanged. she had stable small upper lobe nodules, and interstitial and subpleural coarsened ground glass changes. Given the new disease in her prevascular nodal station, she's seen to consider SBRT as she is not felt to be a good candidate for more aggressive therapies.     PREVIOUS RADIATION THERAPY: No   PAST MEDICAL HISTORY:  Past Medical History:  Diagnosis Date   Arthritis    Back pain    Breast cancer dx'd 11/2012   left   Chest pain    COPD (chronic obstructive pulmonary disease)    Diabetes mellitus without complication 09/28/2014   Ear pain    Fatty liver 6/03   Hypertension    Lung disease    Lung metastases dx'd 11/2012    Obesity, unspecified    Other abnormal glucose    Suicide attempt 1996   Syncope and collapse    Unspecified sleep apnea        PAST SURGICAL HISTORY: Past Surgical History:  Procedure Laterality Date   CARDIAC CATHETERIZATION     2007   CHOLECYSTECTOMY     TUBAL LIGATION       FAMILY HISTORY:  Family History  Problem Relation Age of Onset   Coronary artery disease Father 37   Diabetes Father    Heart disease Father    Breast cancer Mother 77   Cancer Mother 44       breast   Aplastic anemia Daughter        died at age 74   Cancer Maternal Aunt 56       ovarian   Cancer Maternal Grandmother 27       ovarian   Cancer Paternal Aunt 108       ovarian/breast/breast   Coronary artery disease Sister 51   Coronary artery disease Brother 72     SOCIAL HISTORY:  reports that she quit smoking about 54 years ago. Her smoking use included cigarettes. She has a 15.00 pack-year smoking history. She has been exposed to tobacco smoke. She has never used smokeless tobacco. She reports current drug use. Drug: Marijuana. She reports that she does not drink alcohol. The patient is divorced and [place]    ALLERGIES: Meperidine hcl, Penicillins, and Aspirin   MEDICATIONS:  Current Outpatient Medications  Medication Sig Dispense Refill   Blood Glucose Monitoring Suppl (ONE TOUCH ULTRA 2) w/Device KIT 1 kit by Does not apply route QID. ICD 10-code: E11.49. 1 kit 0   insulin glargine (LANTUS SOLOSTAR) 100 UNIT/ML Solostar Pen Inject 18 Units into the skin daily.     insulin lispro (HUMALOG KWIKPEN) 100 UNIT/ML KwikPen Inject 6-10 Units into the skin 3 (three) times daily. Inject 10 u subq three times a day with meals     Insulin Pen Needle (PEN NEEDLES) 30G X 8 MM MISC Please use to inject insulin 4 times daily. 200 each 3   letrozole (FEMARA) 2.5 MG tablet Take 1 tablet (2.5 mg total) by mouth daily. 90 tablet 12   ondansetron (ZOFRAN) 8 MG tablet Take 1 tablet (8 mg total)  by mouth every 8 (eight) hours as needed. 20 tablet 0   OXYGEN Inhale into the lungs.     Semaglutide, 2 MG/DOSE, (OZEMPIC, 2 MG/DOSE,) 8 MG/3ML SOPN Inject 2 mg into the skin once a week. 3 mL 6   albuterol (VENTOLIN HFA) 108 (90 Base) MCG/ACT inhaler INHALE 2 PUFFS INTO THE LUNGS EVERY 6 HOURS AS NEEDED FOR WHEEZE (Patient not taking: Reported on 10/21/2022) 8.5 each 1   loperamide (IMODIUM) 2 MG capsule TAKE 1 CAPSULE (2 MG TOTAL) BY MOUTH AS NEEDED FOR DIARRHEA OR LOOSE STOOLS. (Patient not taking: Reported on 11/04/2022) 30 capsule 1   ONETOUCH ULTRA test strip USE AS INSTRUCTED TO CHECK ONCE DAILY (Patient not taking: Reported on 10/21/2022) 100 strip 12   No current facility-administered medications for this encounter.     REVIEW OF SYSTEMS: On review of systems, the patient reports that she is doing well. She has O2 at baseline  and uses 4 L [place] at home. She has chronic low back pain and has recently started using medical marijuana she has [place] She denies any unintended weight changes, productive cough or hemoptysis. No other complaints are verbalized.      PHYSICAL EXAM:  Wt Readings from Last 3 Encounters:  11/26/22 (!) 302 lb 6.4 oz (137.2 kg)  11/20/22 (!) 302 lb 9.6 oz (137.3 kg)  10/23/22 (!) 305 lb 6.4 oz (138.5 kg)   Temp Readings from Last 3 Encounters:  11/26/22 97.7 F (36.5 C)  11/20/22 98.1 F (36.7 C) (Oral)  10/23/22 97.8 F (36.6 C)   BP Readings from Last 3 Encounters:  11/26/22 (!) 158/74  11/20/22 (!) 172/67  11/04/22 (!) 181/65   Pulse Readings from Last 3 Encounters:  11/26/22 74  11/20/22 83  11/04/22 62   Pain Assessment Pain Score: 10-Worst pain ever Pain Loc: Back (Lower Back)/10  In general this is a well appearing obese caucasian female in no acute distress. She's alert and oriented x4 and appropriate throughout the examination. Cardiopulmonary assessment is negative for acute  distress and she exhibits normal effort.     ECOG = 1  0 - Asymptomatic (Fully active, able to carry on all predisease activities without restriction)  1 - Symptomatic but completely ambulatory (Restricted in physically strenuous activity but ambulatory and able to carry out work of a light or sedentary nature. For example, light housework, office work)  2 - Symptomatic, <50% in bed during the day (Ambulatory and capable of all self care but unable to carry out any  work activities. Up and about more than 50% of waking hours)  3 - Symptomatic, >50% in bed, but not bedbound (Capable of only limited self-care, confined to bed or chair 50% or more of waking hours)  4 - Bedbound (Completely disabled. Cannot carry on any self-care. Totally confined to bed or chair)  5 - Death   Santiago Glad MM, Creech RH, Tormey DC, et al. [phone]). "Toxicity and response criteria of the [facility]". Am. Evlyn Clines. Oncol. 5 (6): 649-55    LABORATORY DATA:  Lab Results  Component Value Date   WBC 6.7 10/23/2022   HGB 13.1 10/23/2022   HCT 39.1 10/23/2022   MCV 88.7 10/23/2022   PLT 237 10/23/2022   Lab Results  Component Value Date   NA 140 10/23/2022   K 4.2 10/23/2022   CL 103 10/23/2022   CO2 32 10/23/2022   Lab Results  Component Value Date   ALT 61 (H) 10/23/2022   AST 52 (H) 10/23/2022   ALKPHOS 79 10/23/2022   BILITOT 0.5 10/23/2022      RADIOGRAPHY: CT CHEST ABDOMEN PELVIS W CONTRAST  Result Date: 11/12/2022 CLINICAL DATA:  Stage IV breast cancer, ongoing therapy. * Tracking Code: BO * EXAM: CT CHEST, ABDOMEN, AND PELVIS WITH CONTRAST TECHNIQUE: Multidetector CT imaging of the chest, abdomen and pelvis was performed following the standard protocol during bolus administration of intravenous contrast. RADIATION DOSE REDUCTION: This exam was performed according to the departmental dose-optimization program which includes automated exposure control, adjustment of the mA  and/or kV according to patient size and/or use of iterative reconstruction technique. CONTRAST:  OMNIPAQUE IOHEXOL 300 MG/ML  SOLN COMPARISON:  10/08/2021. FINDINGS: CT CHEST FINDINGS Cardiovascular: Right IJ Port-A-Cath terminates in the low SVC. Atherosclerotic calcification of the aorta. Enlarged pulmonic trunk and heart. No pericardial effusion. Mediastinum/Nodes: Prevascular adenopathy has enlarged, measuring 1.7 x 2.9 cm, previously 1.3 x 1.8 cm on 10/08/2021. No hilar, axillary or internal mammary adenopathy. Esophagus is grossly unremarkable. Lungs/Pleura: Image quality is degraded by respiratory motion. Upper lobe pulmonary nodules measure up to 6 mm, stable. Mild interstitial and subpleural coarsened ground-glass and linear densities, unchanged. No pleural fluid. Airway is unremarkable. Musculoskeletal: Degenerative changes in the spine. No worrisome lytic or sclerotic lesions. CT ABDOMEN PELVIS FINDINGS Hepatobiliary: Liver measures at the upper limits of normal, 17.7 cm and the margin is slightly irregular. Cholecystectomy. No biliary ductal dilatation. Pancreas: 1.5 cm low-attenuation lesion in the uncinate process of the pancreas, possibly new from 10/08/2021. No ductal dilatation or gland atrophy. Spleen: Negative. Adrenals/Urinary Tract: Adrenal glands are unremarkable. Subcentimeter low-attenuation lesion in the right kidney, too small to characterize. No specific follow-up necessary. Tiny left renal stone. Ureters are decompressed. Bladder is low in volume. Stomach/Bowel: Stomach, small bowel, appendix and colon are unremarkable. Vascular/Lymphatic: Vascular structures are unremarkable. No pathologically enlarged lymph nodes. 13 mm portacaval lymph node, unchanged. Reproductive: Uterus is visualized.  No adnexal mass. Other: No free fluid.  Mesenteries and peritoneum are unremarkable. Musculoskeletal: Degenerative changes in the spine. IMPRESSION: 1. Enlarging prevascular adenopathy,  indicative of metastatic disease. 2. 1.5 cm low-attenuation lesion in the uncinate process of the pancreas, possibly new. Recommend attention on follow-up as malignancy cannot be excluded. If a more aggressive approach is desired, MR abdomen without and with contrast could be performed. 3. Liver appears cirrhotic. 4. Stable small upper lobe pulmonary nodules. These can be reassessed on routine follow-up. 5. Interstitial and subpleural coarsened ground-glass, basilar predominant, as before. 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 6.  Aortic atherosclerosis (ICD10-I70.0). 7. Enlarged pulmonic trunk, indicative of pulmonary arterial hypertension. Electronically Signed   By: Leanna Battles M.D.   On: 11/12/2022 09:09       IMPRESSION/PLAN: 1. Progressive, Stage IV, HER2 amplified invasive breast cancer involving a prevascular node. Dr. Mitzi Hansen discusses the pathology findings and reviews the nature of oligometastatic disease. He recommends a course of ultrahypofractionated radiotherapy to the prevascular lymph node seen on CT.  We discussed the risks, benefits, short, and long term effects of radiotherapy, as well as the curative intent locally, and the patient is interested in proceeding. Dr. Mitzi Hansen discusses the delivery and logistics of radiotherapy and anticipates a course of 8-10 fractions  of radiotherapy. Written consent is obtained and placed in the chart, a copy was provided to the patient. The patient will be contacted to coordinate treatment planning by our simulation department. She will also continue antiHER2 therapy and antiestrogen therapy with Dr. Al Pimple.   In a visit lasting 60 minutes, greater than 50% of the time was spent face to face discussing the patient's condition, in preparation for the discussion, and coordinating the patient's care.   The above documentation reflects my direct findings during this shared patient visit. Please see the separate note by Dr. Mitzi Hansen on  this date for the remainder of the patient's plan of care.    Osker Mason, [facility]   **Disclaimer: This note was dictated with voice recognition software. Similar sounding words can inadvertently be transcribed and this note may contain transcription errors which may not have been corrected upon publication of note.**

## 2022-11-25 DIAGNOSIS — E1149 Type 2 diabetes mellitus with other diabetic neurological complication: Secondary | ICD-10-CM | POA: Diagnosis not present

## 2022-11-25 NOTE — Progress Notes (Signed)
Thoracic Location of Tumor / Histology: Breast Cancer metastasized to lung  Patient presented   CT CAP 11/10/2022: Prevascular adenopathy has enlarged, measuring 1.7 x 2.9 cm, previously 1.3 x 1.8 cm on 10/08/2021. No hilar, axillary or internal mammary adenopathy.  Upper lobe pulmonary nodules measure up to 6 mm, stable. Mild interstitial and subpleural coarsened ground-glass and linear densities, unchanged.  Biopsies of   Tobacco/Marijuana/Snuff/ETOH use:   Past/Anticipated interventions by cardiothoracic surgery, if any:   Past/Anticipated interventions by medical oncology, if any:    Signs/Symptoms Weight changes, if any:  Respiratory complaints, if any:  Hemoptysis, if any:  Pain issues, if any:    SAFETY ISSUES: Prior radiation?  Pacemaker/ICD?   Possible current pregnancy? Postmenopausal Is the patient on methotrexate?   Current Complaints / other details:

## 2022-11-26 ENCOUNTER — Ambulatory Visit
Admission: RE | Admit: 2022-11-26 | Discharge: 2022-11-26 | Disposition: A | Discharge status: Home / Self Care | Payer: 59 | Source: Ambulatory Visit | Attending: Radiation Oncology | Admitting: Radiation Oncology

## 2022-11-26 ENCOUNTER — Encounter: Payer: Self-pay | Admitting: Radiation Oncology

## 2022-11-26 VITALS — BP 158/74 | HR 74 | Temp 97.7°F | Resp 20 | Ht 64.0 in | Wt 302.4 lb

## 2022-11-26 DIAGNOSIS — I10 Essential (primary) hypertension: Secondary | ICD-10-CM | POA: Insufficient documentation

## 2022-11-26 DIAGNOSIS — K760 Fatty (change of) liver, not elsewhere classified: Secondary | ICD-10-CM | POA: Diagnosis not present

## 2022-11-26 DIAGNOSIS — Z794 Long term (current) use of insulin: Secondary | ICD-10-CM | POA: Diagnosis not present

## 2022-11-26 DIAGNOSIS — J449 Chronic obstructive pulmonary disease, unspecified: Secondary | ICD-10-CM | POA: Diagnosis not present

## 2022-11-26 DIAGNOSIS — Z170 Estrogen receptor positive status [ER+]: Secondary | ICD-10-CM | POA: Diagnosis not present

## 2022-11-26 DIAGNOSIS — E119 Type 2 diabetes mellitus without complications: Secondary | ICD-10-CM | POA: Diagnosis not present

## 2022-11-26 DIAGNOSIS — C7800 Secondary malignant neoplasm of unspecified lung: Secondary | ICD-10-CM | POA: Insufficient documentation

## 2022-11-26 DIAGNOSIS — E669 Obesity, unspecified: Secondary | ICD-10-CM | POA: Insufficient documentation

## 2022-11-26 DIAGNOSIS — C50412 Malignant neoplasm of upper-outer quadrant of left female breast: Secondary | ICD-10-CM | POA: Insufficient documentation

## 2022-11-26 DIAGNOSIS — Z79811 Long term (current) use of aromatase inhibitors: Secondary | ICD-10-CM | POA: Insufficient documentation

## 2022-11-26 DIAGNOSIS — M129 Arthropathy, unspecified: Secondary | ICD-10-CM | POA: Insufficient documentation

## 2022-11-26 DIAGNOSIS — G4730 Sleep apnea, unspecified: Secondary | ICD-10-CM | POA: Diagnosis not present

## 2022-11-26 DIAGNOSIS — Z923 Personal history of irradiation: Secondary | ICD-10-CM | POA: Insufficient documentation

## 2022-11-26 DIAGNOSIS — Z8041 Family history of malignant neoplasm of ovary: Secondary | ICD-10-CM | POA: Insufficient documentation

## 2022-11-26 DIAGNOSIS — C50919 Malignant neoplasm of unspecified site of unspecified female breast: Secondary | ICD-10-CM

## 2022-11-26 DIAGNOSIS — Z9151 Personal history of suicidal behavior: Secondary | ICD-10-CM | POA: Insufficient documentation

## 2022-11-26 DIAGNOSIS — Z803 Family history of malignant neoplasm of breast: Secondary | ICD-10-CM | POA: Insufficient documentation

## 2022-11-26 DIAGNOSIS — Z87891 Personal history of nicotine dependence: Secondary | ICD-10-CM | POA: Insufficient documentation

## 2022-11-27 DIAGNOSIS — C50912 Malignant neoplasm of unspecified site of left female breast: Secondary | ICD-10-CM | POA: Diagnosis not present

## 2022-11-27 DIAGNOSIS — Z171 Estrogen receptor negative status [ER-]: Secondary | ICD-10-CM | POA: Diagnosis not present

## 2022-11-27 DIAGNOSIS — C7802 Secondary malignant neoplasm of left lung: Secondary | ICD-10-CM | POA: Diagnosis not present

## 2022-12-02 ENCOUNTER — Institutional Professional Consult (permissible substitution): Payer: 59 | Admitting: Radiation Oncology

## 2022-12-02 ENCOUNTER — Ambulatory Visit: Payer: 59

## 2022-12-05 ENCOUNTER — Ambulatory Visit
Admission: RE | Admit: 2022-12-05 | Discharge: 2022-12-05 | Disposition: A | Discharge status: Home / Self Care | Payer: 59 | Source: Ambulatory Visit | Attending: Radiation Oncology | Admitting: Radiation Oncology

## 2022-12-05 DIAGNOSIS — C50212 Malignant neoplasm of upper-inner quadrant of left female breast: Secondary | ICD-10-CM | POA: Diagnosis not present

## 2022-12-05 DIAGNOSIS — C50912 Malignant neoplasm of unspecified site of left female breast: Secondary | ICD-10-CM | POA: Diagnosis not present

## 2022-12-05 DIAGNOSIS — Z170 Estrogen receptor positive status [ER+]: Secondary | ICD-10-CM | POA: Diagnosis not present

## 2022-12-05 DIAGNOSIS — C7802 Secondary malignant neoplasm of left lung: Secondary | ICD-10-CM | POA: Insufficient documentation

## 2022-12-05 DIAGNOSIS — C7801 Secondary malignant neoplasm of right lung: Secondary | ICD-10-CM | POA: Diagnosis not present

## 2022-12-05 DIAGNOSIS — Z171 Estrogen receptor negative status [ER-]: Secondary | ICD-10-CM | POA: Diagnosis not present

## 2022-12-05 DIAGNOSIS — C779 Secondary and unspecified malignant neoplasm of lymph node, unspecified: Secondary | ICD-10-CM | POA: Diagnosis not present

## 2022-12-09 DIAGNOSIS — R062 Wheezing: Secondary | ICD-10-CM | POA: Diagnosis not present

## 2022-12-09 DIAGNOSIS — I5020 Unspecified systolic (congestive) heart failure: Secondary | ICD-10-CM | POA: Diagnosis not present

## 2022-12-09 DIAGNOSIS — M25569 Pain in unspecified knee: Secondary | ICD-10-CM | POA: Diagnosis not present

## 2022-12-09 DIAGNOSIS — U071 COVID-19: Secondary | ICD-10-CM | POA: Diagnosis not present

## 2022-12-10 DIAGNOSIS — R062 Wheezing: Secondary | ICD-10-CM | POA: Diagnosis not present

## 2022-12-10 DIAGNOSIS — I5020 Unspecified systolic (congestive) heart failure: Secondary | ICD-10-CM | POA: Diagnosis not present

## 2022-12-10 DIAGNOSIS — U071 COVID-19: Secondary | ICD-10-CM | POA: Diagnosis not present

## 2022-12-10 DIAGNOSIS — M25569 Pain in unspecified knee: Secondary | ICD-10-CM | POA: Diagnosis not present

## 2022-12-11 ENCOUNTER — Other Ambulatory Visit: Payer: Self-pay

## 2022-12-11 ENCOUNTER — Other Ambulatory Visit (HOSPITAL_COMMUNITY): Payer: Self-pay

## 2022-12-12 DIAGNOSIS — C7801 Secondary malignant neoplasm of right lung: Secondary | ICD-10-CM | POA: Diagnosis not present

## 2022-12-12 DIAGNOSIS — C7802 Secondary malignant neoplasm of left lung: Secondary | ICD-10-CM | POA: Diagnosis not present

## 2022-12-12 DIAGNOSIS — C50212 Malignant neoplasm of upper-inner quadrant of left female breast: Secondary | ICD-10-CM | POA: Diagnosis not present

## 2022-12-12 DIAGNOSIS — Z170 Estrogen receptor positive status [ER+]: Secondary | ICD-10-CM | POA: Diagnosis not present

## 2022-12-12 DIAGNOSIS — C779 Secondary and unspecified malignant neoplasm of lymph node, unspecified: Secondary | ICD-10-CM | POA: Diagnosis not present

## 2022-12-15 ENCOUNTER — Encounter: Payer: Self-pay | Admitting: Hematology and Oncology

## 2022-12-17 NOTE — Progress Notes (Unsigned)
Palliative Medicine [facility]  Telephone:([phone]) [phone] [phone]) [phone]   Name: [name] Date: 12/17/2022 MRN: [id number]  DOB: [birth year]  Patient Care Team: [name], MD as PCP - General (Family Medicine) Laurey Morale, MD as Consulting Physician (Cardiology) Rachel Moulds, MD as Consulting Physician (Hematology and Oncology)    INTERVAL HISTORY: [name] is a 72 y.o. female with  medical history including metastatic breast cancer with numerous pulmonary nodules bilaterally s/p chemotherapy currently on herceptin for maintenance. Now with complaints of uncontrolled back pain.  Palliative ask to see for symptom management.   SOCIAL HISTORY:     reports that she quit smoking about 54 years ago. Her smoking use included cigarettes. She has a 15.00 pack-year smoking history. She has been exposed to tobacco smoke. She has never used smokeless tobacco. She reports current drug use. Drug: Marijuana. She reports that she does not drink alcohol.  ADVANCE DIRECTIVES:    CODE STATUS:   PAST MEDICAL HISTORY: Past Medical History:  Diagnosis Date   Arthritis    Back pain    Breast cancer (HCC) dx'd 11/2012   left   Chest pain    COPD (chronic obstructive pulmonary disease) (HCC)    Diabetes mellitus without complication (HCC) 09/28/2014   Ear pain    Fatty liver 6/03   Hypertension    Lung disease    Lung metastases dx'd 11/2012   Obesity, unspecified    Other abnormal glucose    Suicide attempt (HCC) 1996   Syncope and collapse    Unspecified sleep apnea     ALLERGIES:  is allergic to meperidine hcl, penicillins, and aspirin.  MEDICATIONS:  Current Outpatient Medications  Medication Sig Dispense Refill   albuterol (VENTOLIN HFA) 108 (90 Base) MCG/ACT inhaler INHALE 2 PUFFS INTO THE LUNGS EVERY 6 HOURS AS NEEDED FOR WHEEZE (Patient not taking: Reported on 10/21/2022) 8.5 each 1   Blood Glucose Monitoring Suppl (ONE TOUCH ULTRA 2)  w/Device KIT 1 kit by Does not apply route QID. ICD 10-code: E11.49. 1 kit 0   insulin glargine (LANTUS SOLOSTAR) 100 UNIT/ML Solostar Pen Inject 18 Units into the skin daily.     insulin lispro (HUMALOG KWIKPEN) 100 UNIT/ML KwikPen Inject 6-10 Units into the skin 3 (three) times daily. Inject 10 u subq three times a day with meals     Insulin Pen Needle (PEN NEEDLES) 30G X 8 MM MISC Please use to inject insulin 4 times daily. 200 each 3   letrozole (FEMARA) 2.5 MG tablet Take 1 tablet (2.5 mg total) by mouth daily. 90 tablet 12   loperamide (IMODIUM) 2 MG capsule TAKE 1 CAPSULE (2 MG TOTAL) BY MOUTH AS NEEDED FOR DIARRHEA OR LOOSE STOOLS. (Patient not taking: Reported on 11/04/2022) 30 capsule 1   ondansetron (ZOFRAN) 8 MG tablet Take 1 tablet (8 mg total) by mouth every 8 (eight) hours as needed. 20 tablet 0   ONETOUCH ULTRA test strip USE AS INSTRUCTED TO CHECK ONCE DAILY (Patient not taking: Reported on 10/21/2022) 100 strip 12   OXYGEN Inhale into the lungs.     Semaglutide, 2 MG/DOSE, (OZEMPIC, 2 MG/DOSE,) 8 MG/3ML SOPN Inject 2 mg into the skin once a week. 3 mL 6   No current facility-administered medications for this visit.    VITAL SIGNS: There were no vitals taken for this visit. There were no vitals filed for this visit.  Estimated body mass index is 51.91 kg/m as  calculated from the following:   Height as of 11/26/22: 5\' 4"  (1.626 m).   Weight as of 11/26/22: 302 lb 6.4 oz (137.2 kg).   PERFORMANCE STATUS (ECOG) : 1 - Symptomatic but completely ambulatory  IMPRESSION:   Neoplasm/Chronic/Neuropathic pain [name]'s pain is well controlled on oxycodone. She does have some breakthrough pain throughout the day however is tolerating.   We discussed her use of Oxy IR as needed for pain. She is tolerating well. Feels some improvement in neuropathic pain after changes to her Lyrica.  She is taking Lyrica 100 mg three times daily.   We will continue to closely monitor and adjust  medications accordingly.   Constipation   Anxiety   7/27: She becomes tearful expressing her disease progression over time. She reports her children are aware of her condition to an extent however she chooses not to "tell them" everything until she knows she is facing end-of-life. I created space and opportunity to allow her to express her thoughts and feelings. [name] states she is having some anxiety to the point she feels as though it is hard to breath and some nervousness. This occurs as she thinks often about her cancer, not if but when will she have to face end-of-life, and how much longer will she make it. Emotional support provided.   Education provided on use of benzodiazepines short-term vs long-term. I discussed at length use of valium in addition to other non-pharmacological therapeutic methods. She verbalized understanding and appreciation.   We will continue to closely monitor and support.    I discussed the importance of continued conversation with family and their medical providers regarding overall plan of care and treatment options, ensuring decisions are within the context of the patients values and GOCs.  PLAN: Oxy IR as needed for breakthrough pain Declines to restart or try different medication focused on long-acting pain relief due to potential symptoms.  Valium twice daily as needed for anxiety  Pregabalin 100mg  three times daily  Miralax daily for bowel regimen Ongoing goals of care discussions and support I will plan to see her back in the office in 3-4 weeks in collaboration with her other oncology appointments. Sooner if needed.    Patient expressed understanding and was in agreement with this plan. She also understands that She can call the clinic at any time with any questions, concerns, or complaints.    Any controlled substances utilized were prescribed in the context of palliative care. PDMP has been reviewed.   Time Total: 35 min   Visit consisted of  counseling and education dealing with the complex and emotionally intense issues of symptom management and palliative care in the setting of serious and potentially life-threatening illness.Greater than 50%  of this time was spent counseling and coordinating care related to the above assessment and plan.  [name], AGPCNP-BC  Palliative Medicine Team/[facility]

## 2022-12-18 ENCOUNTER — Inpatient Hospital Stay (HOSPITAL_BASED_OUTPATIENT_CLINIC_OR_DEPARTMENT_OTHER): Payer: 59 | Admitting: Nurse Practitioner

## 2022-12-18 ENCOUNTER — Encounter: Payer: Self-pay | Admitting: Nurse Practitioner

## 2022-12-18 ENCOUNTER — Inpatient Hospital Stay: Payer: 59

## 2022-12-18 ENCOUNTER — Inpatient Hospital Stay: Payer: 59 | Attending: Oncology

## 2022-12-18 ENCOUNTER — Inpatient Hospital Stay (HOSPITAL_BASED_OUTPATIENT_CLINIC_OR_DEPARTMENT_OTHER): Payer: 59 | Admitting: Hematology and Oncology

## 2022-12-18 VITALS — BP 170/62 | HR 63 | Temp 97.5°F | Resp 18 | Wt 302.7 lb

## 2022-12-18 VITALS — BP 139/55 | HR 71 | Resp 18

## 2022-12-18 DIAGNOSIS — Z86718 Personal history of other venous thrombosis and embolism: Secondary | ICD-10-CM | POA: Diagnosis not present

## 2022-12-18 DIAGNOSIS — M549 Dorsalgia, unspecified: Secondary | ICD-10-CM | POA: Insufficient documentation

## 2022-12-18 DIAGNOSIS — K5903 Drug induced constipation: Secondary | ICD-10-CM

## 2022-12-18 DIAGNOSIS — C7802 Secondary malignant neoplasm of left lung: Secondary | ICD-10-CM | POA: Diagnosis not present

## 2022-12-18 DIAGNOSIS — Z95828 Presence of other vascular implants and grafts: Secondary | ICD-10-CM

## 2022-12-18 DIAGNOSIS — Z803 Family history of malignant neoplasm of breast: Secondary | ICD-10-CM | POA: Diagnosis not present

## 2022-12-18 DIAGNOSIS — C7800 Secondary malignant neoplasm of unspecified lung: Secondary | ICD-10-CM

## 2022-12-18 DIAGNOSIS — Z5112 Encounter for antineoplastic immunotherapy: Secondary | ICD-10-CM | POA: Diagnosis not present

## 2022-12-18 DIAGNOSIS — Z87891 Personal history of nicotine dependence: Secondary | ICD-10-CM | POA: Insufficient documentation

## 2022-12-18 DIAGNOSIS — G8929 Other chronic pain: Secondary | ICD-10-CM | POA: Diagnosis not present

## 2022-12-18 DIAGNOSIS — C50919 Malignant neoplasm of unspecified site of unspecified female breast: Secondary | ICD-10-CM

## 2022-12-18 DIAGNOSIS — G893 Neoplasm related pain (acute) (chronic): Secondary | ICD-10-CM

## 2022-12-18 DIAGNOSIS — Z515 Encounter for palliative care: Secondary | ICD-10-CM

## 2022-12-18 DIAGNOSIS — C7801 Secondary malignant neoplasm of right lung: Secondary | ICD-10-CM | POA: Insufficient documentation

## 2022-12-18 DIAGNOSIS — Z170 Estrogen receptor positive status [ER+]: Secondary | ICD-10-CM | POA: Diagnosis not present

## 2022-12-18 DIAGNOSIS — C50212 Malignant neoplasm of upper-inner quadrant of left female breast: Secondary | ICD-10-CM | POA: Insufficient documentation

## 2022-12-18 DIAGNOSIS — Z79811 Long term (current) use of aromatase inhibitors: Secondary | ICD-10-CM | POA: Diagnosis not present

## 2022-12-18 DIAGNOSIS — Z8041 Family history of malignant neoplasm of ovary: Secondary | ICD-10-CM | POA: Diagnosis not present

## 2022-12-18 DIAGNOSIS — K869 Disease of pancreas, unspecified: Secondary | ICD-10-CM | POA: Diagnosis not present

## 2022-12-18 LAB — CBC WITH DIFFERENTIAL (CANCER CENTER ONLY)
Abs Immature Granulocytes: 0.02 10*3/uL (ref 0.00–0.07)
Basophils Absolute: 0 10*3/uL (ref 0.0–0.1)
Basophils Relative: 1 %
Eosinophils Absolute: 0.1 10*3/uL (ref 0.0–0.5)
Eosinophils Relative: 1 %
HCT: 40.9 % (ref 36.0–46.0)
Hemoglobin: 14 g/dL (ref 12.0–15.0)
Immature Granulocytes: 0 %
Lymphocytes Relative: 34 %
Lymphs Abs: 2.3 10*3/uL (ref 0.7–4.0)
MCH: 30.2 pg (ref 26.0–34.0)
MCHC: 34.2 g/dL (ref 30.0–36.0)
MCV: 88.3 fL (ref 80.0–100.0)
Monocytes Absolute: 0.5 10*3/uL (ref 0.1–1.0)
Monocytes Relative: 7 %
Neutro Abs: 3.9 10*3/uL (ref 1.7–7.7)
Neutrophils Relative %: 57 %
Platelet Count: 213 10*3/uL (ref 150–400)
RBC: 4.63 MIL/uL (ref 3.87–5.11)
RDW: 13.6 % (ref 11.5–15.5)
WBC Count: 6.8 10*3/uL (ref 4.0–10.5)
nRBC: 0 % (ref 0.0–0.2)

## 2022-12-18 LAB — CMP (CANCER CENTER ONLY)
ALT: 57 U/L — ABNORMAL HIGH (ref 0–44)
AST: 44 U/L — ABNORMAL HIGH (ref 15–41)
Albumin: 3.9 g/dL (ref 3.5–5.0)
Alkaline Phosphatase: 79 U/L (ref 38–126)
Anion gap: 6 (ref 5–15)
BUN: 12 mg/dL (ref 8–23)
CO2: 32 mmol/L (ref 22–32)
Calcium: 9.6 mg/dL (ref 8.9–10.3)
Chloride: 102 mmol/L (ref 98–111)
Creatinine: 0.68 mg/dL (ref 0.44–1.00)
GFR, Estimated: >60 mL/min (ref >60)
Glucose, Bld: 119 mg/dL — ABNORMAL HIGH (ref 70–99)
Potassium: 4.2 mmol/L (ref 3.5–5.1)
Sodium: 140 mmol/L (ref 135–145)
Total Bilirubin: 0.6 mg/dL (ref 0.3–1.2)
Total Protein: 7.6 g/dL (ref 6.5–8.1)

## 2022-12-18 MED ORDER — LORAZEPAM 2 MG/ML IJ SOLN
0.5000 mg | Freq: Once | INTRAMUSCULAR | Status: AC
  Administered 2022-12-18: 0.5 mg via INTRAVENOUS
  Filled 2022-12-18: qty 1

## 2022-12-18 MED ORDER — TRASTUZUMAB-ANNS CHEMO 150 MG IV SOLR
750.0000 mg | Freq: Once | INTRAVENOUS | Status: AC
  Administered 2022-12-18: 750 mg via INTRAVENOUS
  Filled 2022-12-18: qty 35.7

## 2022-12-18 MED ORDER — HEPARIN SOD (PORK) LOCK FLUSH 100 UNIT/ML IV SOLN
500.0000 [IU] | Freq: Once | INTRAVENOUS | Status: AC | PRN
  Administered 2022-12-18: 500 [IU]

## 2022-12-18 MED ORDER — OXYCODONE HCL 10 MG PO TABS
10.0000 mg | ORAL_TABLET | Freq: Four times a day (QID) | ORAL | 0 refills | Status: DC | PRN
Start: 2022-12-18 — End: 2023-01-29

## 2022-12-18 MED ORDER — SODIUM CHLORIDE 0.9% FLUSH
10.0000 mL | INTRAVENOUS | Status: DC | PRN
  Administered 2022-12-18: 10 mL

## 2022-12-18 MED ORDER — ACETAMINOPHEN 325 MG PO TABS
650.0000 mg | ORAL_TABLET | Freq: Once | ORAL | Status: AC
  Administered 2022-12-18: 650 mg via ORAL
  Filled 2022-12-18: qty 2

## 2022-12-18 MED ORDER — SODIUM CHLORIDE 0.9 % IV SOLN: Freq: Once | INTRAVENOUS | Status: AC

## 2022-12-18 MED ORDER — DIPHENHYDRAMINE HCL 25 MG PO CAPS
50.0000 mg | ORAL_CAPSULE | Freq: Once | ORAL | Status: AC
  Administered 2022-12-18: 50 mg via ORAL
  Filled 2022-12-18: qty 2

## 2022-12-18 MED ORDER — SODIUM CHLORIDE 0.9% FLUSH
10.0000 mL | Freq: Once | INTRAVENOUS | Status: AC
  Administered 2022-12-18: 10 mL

## 2022-12-18 NOTE — Progress Notes (Signed)
[facility] Cancer Follow up:    [name], MD 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 [facility]) Staging form: Breast, AJCC 7th Edition - Clinical: Stage IV (TX, NX, M1) - Signed by Lowella Dell, MD on 04/17/2015  Malignant neoplasm of upper-inner quadrant of left breast in female, estrogen receptor positive (HCC) Staging form: Breast, AJCC 7th Edition - Clinical: Stage IA (T1c, N0, cM0) - Unsigned Specimen type: Core Needle Biopsy Histopathologic type: 9931 Laterality: Left Staging comments: Staged at breast conference 4.2.14  - Pathologic: Stage IV (M1) - Unsigned Specimen type: Core Needle Biopsy Histopathologic type: 9931 Laterality: Left   SUMMARY OF ONCOLOGIC HISTORY: 72 y.o. [name] with stage IV breast cancer initially diagnosed March 2014    (1) s/p left breast upper inner quadrant biopsy 11/05/2012 for a clinical T1c NX M1, stage IV invasive ductal carcinoma, grade 3, estrogen and progesterone receptor negative, with an MIB-1 of 77%, and HER-2 amplified by CISH with a ratio of 4.39.             (a) mammography 04/16/2016 shows the left breast mass to have nearly completely resolved   (2) chest, abdomen and pelvis CT scans and PET scan April 2014 showed multiple bilateral pulmonaru nodules but no liver or bone involvement; biopsy of a pulmonary nodule on 11/30/2012 confirmed metastatic breast cancer.              (a) CT in GI obtained 09/28/2014 shows no measurable disease in the lungs             (b) chest CT 12/20/2015 showed stable small right lung pulmonary nodules and an area in the right lower lobe pleural parenchymal thickness requiring attention in future studies              (3) received docetaxel / trastuzumab/ pertuzumab x4, completed 02/07/2013, with a good response,    (4) trastuzumab/ pertuzumab continued every 21 days;             (a) Pertuzumab  discontinued after 07/28/2016, and Trastuzumab given every 4 weeks starting 08/2016             (b) most recent echocardiogram 06/28/2018 shows EF of 60-65% (these will be every 6 months)   (5) anastrozole started 02/15/2013, discontinued October 2014 with poor tolerance   (6) 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   CURRENT THERAPY: Letrozole, Herceptin  INTERVAL HISTORY:  [name] 72 y.o. female returns for follow-up prior to receiving Herceptin therapy.  She once again mentions that the back pain is unbearable especially when she was getting simulated for radiation.  She had concerns about the pancreatic mass, wondering if this is concerning for cancer. Besides the back pain, she denies any other complaints.  Rest of the pertinent 10 point ROS reviewed and neg.  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  MEDICAL HISTORY: Past Medical History:  Diagnosis Date   Arthritis    Back pain    Breast cancer (HCC) dx'd 11/2012   left   Chest pain    COPD (chronic obstructive pulmonary disease) (HCC)    Diabetes mellitus without complication  (HCC) 09/28/2014   Ear pain    Fatty liver 6/03   Hypertension    Lung disease    Lung metastases dx'd 11/2012   Obesity, unspecified    Other abnormal glucose    Suicide attempt (HCC) 1996   Syncope and collapse    Unspecified sleep apnea     SURGICAL HISTORY: Past Surgical History:  Procedure Laterality Date   CARDIAC CATHETERIZATION     2007   CHOLECYSTECTOMY     TUBAL LIGATION      SOCIAL HISTORY: Social History   Socioeconomic History   Marital status: Divorced    Spouse name: Not on file   Number of children: 4   Years of education: 12   Highest education level: 12th grade  Occupational History   Occupation: Disability   Occupation: retired-daycare  Tobacco Use   Smoking status: Former    Packs/day: 3.00    Years: 5.00    Additional pack years: 0.00    Total pack years: 15.00    Types: Cigarettes    Quit date: 08/18/1968    Years since quitting: 54.3    Passive exposure: Past   Smokeless tobacco: Never  Vaping Use   Vaping Use: Never used  Substance and Sexual Activity   Alcohol use: No    Alcohol/week: 0.0 standard drinks of alcohol   Drug use: Yes    Types: Marijuana    Comment: Medical Marijuana   Sexual activity: Not Currently    Partners: Male  Other Topics Concern   Not on file  Social History Narrative   Health Care POA:    Emergency Contact: Amador Cunas [phone]   End of Life Plan:    Who lives with you: two grandchildren, friend, daughter   Any pets: 3 poodles   Diet: Pt has a variety of protein, starch, vegetables.  Pt is currently working on cutting back on portions for weight loss.   Exercise: Pt has not regular exercise routine.  Occasionally walks around home.   Seatbelts: Pt reports wearing seatbelt occasionally.   Sun Exposure/Protection: Pt does not use sun protection   Hobbies: reading, playing on kindle, ebay         Currently in her home she keeps her granddaughter [name], 16, who is the daughter of the patient's  daughter Para March (the patient refers to [name] as "my adopted daughter"); grandson Tesoro "[name]" Henery, [place], who is [name]'s half-brother; daughter [name], and an [place] friend, [name], the patient's significant other. Daughter [name] is a Public librarian, currently unemployed. Son [name] works as an Personnel officer in [place]. Daughter Para March is currently in prison due to killing someone in a car accident. Daughter [name] died from aplastic anemia at the age of 65. The patient has a total of 4 grandchildren. She is not a church attender   Social Determinants of Health   Financial Resource Strain: Medium Risk (07/30/2022)  Overall Financial Resource Strain (CARDIA)    Difficulty of Paying Living Expenses: Somewhat hard  Food Insecurity: No Food Insecurity (07/30/2022)   Hunger Vital Sign    Worried About Running Out of Food in the Last Year: Never true    Ran Out of Food in the Last Year: Never true  Transportation Needs: No Transportation Needs (07/30/2022)   PRAPARE - Administrator, Civil Service (Medical): No    Lack of Transportation (Non-Medical): No  Physical Activity: Inactive (07/30/2022)   Exercise Vital Sign    Days of Exercise per Week: 0 days    Minutes of Exercise per Session: 0 min  Stress: No Stress Concern Present (07/30/2022)   [employer] of Occupational Health - Occupational Stress Questionnaire    Feeling of Stress : Only a little  Social Connections: Moderately Integrated (07/30/2022)   Social Connection and Isolation Panel [NHANES]    Frequency of Communication with Friends and Family: More than three times a week    Frequency of Social Gatherings with Friends and Family: More than three times a week    Attends Religious Services: More than 4 times per year    Active Member of [employer] or Organizations: Yes    Attends Engineer, structural: More than 4 times per year    Marital Status: Divorced   Intimate Partner Violence: Not At Risk (07/30/2022)   Humiliation, Afraid, Rape, and Kick questionnaire    Fear of Current or Ex-Partner: No    Emotionally Abused: No    Physically Abused: No    Sexually Abused: No    FAMILY HISTORY: Family History  Problem Relation Age of Onset   Coronary artery disease Father 29   Diabetes Father    Heart disease Father    Breast cancer Mother 73   Cancer Mother 44       breast   Aplastic anemia Daughter        died at age 22   Cancer Maternal Aunt 33       ovarian   Cancer Maternal Grandmother 76       ovarian   Cancer Paternal Aunt 78       ovarian/breast/breast   Coronary artery disease Sister 91   Coronary artery disease Brother 83    Review of Systems  Constitutional:  Negative for appetite change, chills, fatigue, fever and unexpected weight change.  HENT:   Negative for hearing loss, lump/mass and trouble swallowing.   Eyes:  Negative for eye problems and icterus.  Respiratory:  Negative for chest tightness, cough and shortness of breath.   Cardiovascular:  Negative for chest pain, leg swelling and palpitations.  Gastrointestinal:  Negative for abdominal distention, abdominal pain, constipation, diarrhea, nausea and vomiting.  Endocrine: Negative for hot flashes.  Genitourinary:  Negative for difficulty urinating.   Musculoskeletal:  Positive for back pain. Negative for arthralgias.  Skin:  Negative for itching and rash.  Neurological:  Negative for dizziness, extremity weakness, headaches and numbness.  Hematological:  Negative for adenopathy. Does not bruise/bleed easily.  Psychiatric/Behavioral:  Negative for depression. The patient is not nervous/anxious.       PHYSICAL EXAMINATION  ECOG PERFORMANCE STATUS: 3 - Symptomatic, >50% confined to bed  Vitals:   12/18/22 1250  BP: (!) 170/62  Pulse: 63  Resp: 18  Temp: (!) 97.5 F (36.4 C)  SpO2: 96%    Physical Exam Constitutional:      General: She is not in  acute distress.  Appearance: Normal appearance. She is not toxic-appearing.  HENT:     Head: Normocephalic and atraumatic.  Eyes:     General: No scleral icterus. Cardiovascular:     Rate and Rhythm: Normal rate and regular rhythm.     Pulses: Normal pulses.     Heart sounds: Normal heart sounds.  Pulmonary:     Effort: Pulmonary effort is normal.     Breath sounds: Normal breath sounds.  Abdominal:     General: Abdomen is flat. Bowel sounds are normal. There is no distension.     Palpations: Abdomen is soft.     Tenderness: There is no abdominal tenderness.  Musculoskeletal:        General: No swelling.     Cervical back: Neck supple.  Lymphadenopathy:     Cervical: No cervical adenopathy.  Skin:    General: Skin is warm and dry.     Findings: No rash.  Neurological:     General: No focal deficit present.     Mental Status: She is alert.  Psychiatric:        Mood and Affect: Mood normal.        Behavior: Behavior normal.     LABORATORY DATA:  CBC    Component Value Date/Time   WBC 6.8 12/18/2022 1229   WBC 8.0 05/10/2020 0447   RBC 4.63 12/18/2022 1229   HGB 14.0 12/18/2022 1229   HGB 12.8 08/05/2017 0927   HCT 40.9 12/18/2022 1229   HCT 39.8 08/05/2017 0927   PLT 213 12/18/2022 1229   PLT 267 08/05/2017 0927   MCV 88.3 12/18/2022 1229   MCV 86.7 08/05/2017 0927   MCH 30.2 12/18/2022 1229   MCHC 34.2 12/18/2022 1229   RDW 13.6 12/18/2022 1229   RDW 14.3 08/05/2017 0927   LYMPHSABS 2.3 12/18/2022 1229   LYMPHSABS 2.3 08/05/2017 0927   MONOABS 0.5 12/18/2022 1229   MONOABS 0.4 08/05/2017 0927   EOSABS 0.1 12/18/2022 1229   EOSABS 0.1 08/05/2017 0927   BASOSABS 0.0 12/18/2022 1229   BASOSABS 0.0 08/05/2017 0927    CMP     Component Value Date/Time   NA 140 12/18/2022 1229   NA 141 08/05/2017 0927   K 4.2 12/18/2022 1229   K 4.0 08/05/2017 0927   CL 102 12/18/2022 1229   CL 101 02/07/2013 1125   CO2 32 12/18/2022 1229   CO2 31 (H) 08/05/2017  0927   GLUCOSE 119 (H) 12/18/2022 1229   GLUCOSE 125 08/05/2017 0927   GLUCOSE 193 (H) 02/07/2013 1125   BUN 12 12/18/2022 1229   BUN 22.7 08/05/2017 0927   CREATININE 0.68 12/18/2022 1229   CREATININE 0.8 08/05/2017 0927   CALCIUM 9.6 12/18/2022 1229   CALCIUM 9.7 08/05/2017 0927   PROT 7.6 12/18/2022 1229   PROT 7.3 08/05/2017 0927   ALBUMIN 3.9 12/18/2022 1229   ALBUMIN 3.2 (L) 08/05/2017 0927   AST 44 (H) 12/18/2022 1229   AST 19 08/05/2017 0927   ALT 57 (H) 12/18/2022 1229   ALT 28 08/05/2017 0927   ALKPHOS 79 12/18/2022 1229   ALKPHOS 108 08/05/2017 0927   BILITOT 0.6 12/18/2022 1229   BILITOT 0.34 08/05/2017 0927   GFRNONAA >60 12/18/2022 1229   GFRAA >60 05/10/2020 0447   GFRAA >60 04/18/2020 1023   ASSESSMENT and THERAPY PLAN:   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  1.  Metastatic breast cancer: Most recent scan with pre vascular adenopathy increasing in size suggestive of progression. Since thi appears to be the only area for progression, we discussed that she could consider palliative radiation and continue herceptin maintenance.  She followed up with radiation oncology, she had terrible time with her position, severe back pain when she was getting simulated and she was hoping we can adjust her pain medication to allow for palliative radiation.  I discussed with Nikki our palliative care nurse practitioner to see if they can assist with some pain medication titration during the process and she expressed understanding.  I once again discussed with the patient that this may be the least invasive option and if she cannot tolerate radiation, then we can try switching the medication to Kadcyla.  2.  Chronic back pain: Continue follow-up with palliative care.  3. Low attenuation lesion in uncinate process of pancreas, possibly new. We discussed about MR  abdomen. She refused it, she can't stay still with her back pain. We agreed to follow up on repeat CT imaging in about 3-4 months.   I recommended that she continue on letrozole and receive Herceptin every 4 weeks.    All questions were answered. The patient knows to call the clinic with any problems, questions or concerns. We can certainly see the patient much sooner if necessary.  Total encounter time:30 minutes*in face-to-face visit time, chart review, lab review, care coordination, order entry, and documentation of the encounter time.    *Total Encounter Time as defined by the Centers for Medicare and Medicaid Services includes, in addition to the face-to-face time of a patient visit (documented in the note above) non-face-to-face time: obtaining and reviewing outside history, ordering and reviewing medications, tests or procedures, care coordination (communications with other health care professionals or caregivers) and documentation in the medical record.

## 2022-12-18 NOTE — Patient Instructions (Signed)
[facility]  Discharge Instructions: Thank you for choosing [facility] to provide your oncology and hematology care.   If you have a lab appointment with the Cancer Center, please go directly to the Cancer Center and check in at the registration area.   Wear comfortable clothing and clothing appropriate for easy access to any Portacath or PICC line.   We strive to give you quality time with your provider. You may need to reschedule your appointment if you arrive late (15 or more minutes).  Arriving late affects you and other patients whose appointments are after yours.  Also, if you miss three or more appointments without notifying the office, you may be dismissed from the clinic at the provider's discretion.      For prescription refill requests, have your pharmacy contact our office and allow 72 hours for refills to be completed.    Today you received the following chemotherapy and/or immunotherapy agents: Kanjinti.      To help prevent nausea and vomiting after your treatment, we encourage you to take your nausea medication as directed.  BELOW ARE SYMPTOMS THAT SHOULD BE REPORTED IMMEDIATELY: *FEVER GREATER THAN 100.4 F (38 C) OR HIGHER *CHILLS OR SWEATING *NAUSEA AND VOMITING THAT IS NOT CONTROLLED WITH YOUR NAUSEA MEDICATION *UNUSUAL SHORTNESS OF BREATH *UNUSUAL BRUISING OR BLEEDING *URINARY PROBLEMS (pain or burning when urinating, or frequent urination) *BOWEL PROBLEMS (unusual diarrhea, constipation, pain near the anus) TENDERNESS IN MOUTH AND THROAT WITH OR WITHOUT PRESENCE OF ULCERS (sore throat, sores in mouth, or a toothache) UNUSUAL RASH, SWELLING OR PAIN  UNUSUAL VAGINAL DISCHARGE OR ITCHING   Items with * indicate a potential emergency and should be followed up as soon as possible or go to the Emergency Department if any problems should occur.  Please show the CHEMOTHERAPY ALERT CARD or IMMUNOTHERAPY ALERT CARD at  check-in to the Emergency Department and triage nurse.  Should you have questions after your visit or need to cancel or reschedule your appointment, please contact [facility]  Dept: [phone]  and follow the prompts.  Office hours are 8:00 a.m. to 4:30 p.m. Monday - Friday. Please note that voicemails left after 4:00 p.m. may not be returned until the following business day.  We are closed weekends and major holidays. You have access to a nurse at all times for urgent questions. Please call the main number to the clinic Dept: [phone] and follow the prompts.   For any non-urgent questions, you may also contact your provider using MyChart. We now offer e-Visits for anyone 18 and older to request care online for non-urgent symptoms. For details visit [email or web address].   Also download the MyChart app! Go to the app store, search "MyChart", open the app, select [facility], and log in with your MyChart username and password.   

## 2022-12-19 ENCOUNTER — Encounter: Payer: Self-pay | Admitting: Hematology and Oncology

## 2022-12-19 NOTE — Telephone Encounter (Signed)
Received returned call from [facility] regarding statin prescription in patient with Type II Diabetes. Please advise.   Veronda Prude, RN

## 2022-12-22 ENCOUNTER — Encounter: Payer: Self-pay | Admitting: Radiation Oncology

## 2022-12-23 ENCOUNTER — Ambulatory Visit: Payer: 59

## 2022-12-23 NOTE — Progress Notes (Deleted)
Palliative Medicine [facility]  [phone] [phone]   Name: [name] Date: 12/23/2022 MRN: [id number]  DOB: Jul 20, 1951  Patient Care Team: [name], MD as PCP - General (Family Medicine) Laurey Morale, MD as Consulting Physician (Cardiology) Rachel Moulds, MD as Consulting Physician (Hematology and Oncology)    INTERVAL HISTORY: [name] is a 72 y.o. female with  medical history including metastatic breast cancer with numerous pulmonary nodules bilaterally s/p chemotherapy currently on herceptin for maintenance. Now with complaints of uncontrolled back pain.  Palliative ask to see for symptom management.   SOCIAL HISTORY:     reports that she quit smoking about 54 years ago. Her smoking use included cigarettes. She has a 15.00 pack-year smoking history. She has been exposed to tobacco smoke. She has never used smokeless tobacco. She reports current drug use. Drug: Marijuana. She reports that she does not drink alcohol.  ADVANCE DIRECTIVES:    CODE STATUS:   PAST MEDICAL HISTORY: Past Medical History:  Diagnosis Date   Arthritis    Back pain    Breast cancer (HCC) dx'd 11/2012   left   Chest pain    COPD (chronic obstructive pulmonary disease) (HCC)    Diabetes mellitus without complication (HCC) 09/28/2014   Ear pain    Fatty liver 6/03   Hypertension    Lung disease    Lung metastases dx'd 11/2012   Obesity, unspecified    Other abnormal glucose    Suicide attempt (HCC) 1996   Syncope and collapse    Unspecified sleep apnea     ALLERGIES:  is allergic to meperidine hcl, penicillins, and aspirin.  MEDICATIONS:  Current Outpatient Medications  Medication Sig Dispense Refill   albuterol (VENTOLIN HFA) 108 (90 Base) MCG/ACT inhaler INHALE 2 PUFFS INTO THE LUNGS EVERY 6 HOURS AS NEEDED FOR WHEEZE (Patient not taking: Reported on 10/21/2022) 8.5 each 1   Blood Glucose Monitoring Suppl (ONE TOUCH ULTRA 2)  w/Device KIT 1 kit by Does not apply route QID. ICD 10-code: E11.49. 1 kit 0   insulin glargine (LANTUS SOLOSTAR) 100 UNIT/ML Solostar Pen Inject 18 Units into the skin daily.     insulin lispro (HUMALOG KWIKPEN) 100 UNIT/ML KwikPen Inject 6-10 Units into the skin 3 (three) times daily. Inject 10 u subq three times a day with meals     Insulin Pen Needle (PEN NEEDLES) 30G X 8 MM MISC Please use to inject insulin 4 times daily. 200 each 3   letrozole (FEMARA) 2.5 MG tablet Take 1 tablet (2.5 mg total) by mouth daily. 90 tablet 12   loperamide (IMODIUM) 2 MG capsule TAKE 1 CAPSULE (2 MG TOTAL) BY MOUTH AS NEEDED FOR DIARRHEA OR LOOSE STOOLS. (Patient not taking: Reported on 11/04/2022) 30 capsule 1   ondansetron (ZOFRAN) 8 MG tablet Take 1 tablet (8 mg total) by mouth every 8 (eight) hours as needed. 20 tablet 0   ONETOUCH ULTRA test strip USE AS INSTRUCTED TO CHECK ONCE DAILY (Patient not taking: Reported on 10/21/2022) 100 strip 12   oxyCODONE 10 MG TABS Take 1 tablet (10 mg total) by mouth every 6 (six) hours as needed for severe pain. 60 tablet 0   OXYGEN Inhale into the lungs.     Semaglutide, 2 MG/DOSE, (OZEMPIC, 2 MG/DOSE,) 8 MG/3ML SOPN Inject 2 mg into the skin once a week. 3 mL 6   No current facility-administered medications for this visit.    VITAL  SIGNS: There were no vitals taken for this visit. There were no vitals filed for this visit.  Estimated body mass index is 51.96 kg/m as calculated from the following:   Height as of 11/26/22: 5\' 4"  (1.626 m).   Weight as of 12/18/22: 302 lb 11.2 oz (137.3 kg).   PERFORMANCE STATUS (ECOG) : 1 - Symptomatic but completely ambulatory  Assessment NAD, in wheelchair Normal breathing pattern with home oxygen RRR AAO x 4   IMPRESSION:   Neoplasm/Chronic/Neuropathic pain [name] is complaining of back and lower extremity pain. Nothing improves pain. Shares she has been using muscle rub which provides minimal relief. Describes pain as  sharp, stabbing, and throbbing. Pain is constant. Some days worst than others. She is concerned with her level of pain and ability to tolerate radiation.  We discussed pre-medicating 30-45 min prior to radiation for pain. She verbalized understanding.   Patient previously taking Oxy IR without difficulty. We have attempted long-acting medications and Lyrica/Gabapentin in the past however she was not able to tolerate due to nausea and vomiting. Education provided on use of Oxy given no previous intolerance. She verbalized understanding.   We will continue to closely monitor and adjust medications accordingly.   Constipation   I discussed the importance of continued conversation with family and their medical providers regarding overall plan of care and treatment options, ensuring decisions are within the context of the patients values and GOCs.  PLAN: Oxy IR 10 mg as needed  Miralax daily for bowel regimen Ongoing goals of care discussions and support I will plan to see her back in the office in 1-2 weeks in collaboration with her other oncology appointments. Sooner if needed.    Patient expressed understanding and was in agreement with this plan. She also understands that She can call the clinic at any time with any questions, concerns, or complaints.    Any controlled substances utilized were prescribed in the context of palliative care. PDMP has been reviewed.    Visit consisted of counseling and education dealing with the complex and emotionally intense issues of symptom management and palliative care in the setting of serious and potentially life-threatening illness.Greater than 50%  of this time was spent counseling and coordinating care related to the above assessment and plan.  [name], AGPCNP-BC  Palliative Medicine Team/[facility]  *Please note that this is a verbal dictation therefore any spelling or grammatical errors are due to the "Dragon [employer]  One" system interpretation.

## 2022-12-25 ENCOUNTER — Ambulatory Visit: Payer: 59

## 2022-12-25 DIAGNOSIS — C50212 Malignant neoplasm of upper-inner quadrant of left female breast: Secondary | ICD-10-CM | POA: Insufficient documentation

## 2022-12-25 DIAGNOSIS — C7801 Secondary malignant neoplasm of right lung: Secondary | ICD-10-CM | POA: Diagnosis not present

## 2022-12-25 DIAGNOSIS — C50912 Malignant neoplasm of unspecified site of left female breast: Secondary | ICD-10-CM | POA: Diagnosis not present

## 2022-12-25 DIAGNOSIS — C779 Secondary and unspecified malignant neoplasm of lymph node, unspecified: Secondary | ICD-10-CM | POA: Diagnosis not present

## 2022-12-25 DIAGNOSIS — Z170 Estrogen receptor positive status [ER+]: Secondary | ICD-10-CM | POA: Diagnosis not present

## 2022-12-25 DIAGNOSIS — C7802 Secondary malignant neoplasm of left lung: Secondary | ICD-10-CM | POA: Insufficient documentation

## 2022-12-25 DIAGNOSIS — Z171 Estrogen receptor negative status [ER-]: Secondary | ICD-10-CM | POA: Diagnosis not present

## 2022-12-25 DIAGNOSIS — E1149 Type 2 diabetes mellitus with other diabetic neurological complication: Secondary | ICD-10-CM | POA: Diagnosis not present

## 2022-12-26 ENCOUNTER — Encounter: Payer: Self-pay | Admitting: Hematology and Oncology

## 2022-12-29 ENCOUNTER — Ambulatory Visit
Admission: RE | Admit: 2022-12-29 | Discharge: 2022-12-29 | Disposition: A | Discharge status: Home / Self Care | Payer: 59 | Source: Ambulatory Visit | Attending: Radiation Oncology | Admitting: Radiation Oncology

## 2022-12-29 ENCOUNTER — Other Ambulatory Visit: Payer: Self-pay

## 2022-12-29 DIAGNOSIS — Z171 Estrogen receptor negative status [ER-]: Secondary | ICD-10-CM | POA: Diagnosis not present

## 2022-12-29 DIAGNOSIS — C50912 Malignant neoplasm of unspecified site of left female breast: Secondary | ICD-10-CM | POA: Diagnosis not present

## 2022-12-29 DIAGNOSIS — Z510 Encounter for antineoplastic radiation therapy: Secondary | ICD-10-CM | POA: Diagnosis not present

## 2022-12-29 DIAGNOSIS — C7802 Secondary malignant neoplasm of left lung: Secondary | ICD-10-CM | POA: Diagnosis not present

## 2022-12-29 DIAGNOSIS — C7801 Secondary malignant neoplasm of right lung: Secondary | ICD-10-CM | POA: Diagnosis not present

## 2022-12-29 DIAGNOSIS — C779 Secondary and unspecified malignant neoplasm of lymph node, unspecified: Secondary | ICD-10-CM | POA: Diagnosis not present

## 2022-12-29 DIAGNOSIS — Z170 Estrogen receptor positive status [ER+]: Secondary | ICD-10-CM | POA: Diagnosis not present

## 2022-12-29 DIAGNOSIS — C50212 Malignant neoplasm of upper-inner quadrant of left female breast: Secondary | ICD-10-CM | POA: Diagnosis not present

## 2022-12-29 LAB — RAD ONC ARIA SESSION SUMMARY
Course Elapsed Days: 0
Plan Fractions Treated to Date: 1
Plan Prescribed Dose Per Fraction: 7.5 Gy
Plan Total Fractions Prescribed: 8
Plan Total Prescribed Dose: 60 Gy
Reference Point Dosage Given to Date: 7.5 Gy
Reference Point Session Dosage Given: 7.5 Gy
Session Number: 1

## 2022-12-30 ENCOUNTER — Other Ambulatory Visit: Payer: Self-pay

## 2022-12-30 ENCOUNTER — Ambulatory Visit
Admission: RE | Admit: 2022-12-30 | Discharge: 2022-12-30 | Disposition: A | Discharge status: Home / Self Care | Payer: 59 | Source: Ambulatory Visit | Attending: Radiation Oncology | Admitting: Radiation Oncology

## 2022-12-30 ENCOUNTER — Inpatient Hospital Stay: Payer: 59 | Admitting: Nurse Practitioner

## 2022-12-30 DIAGNOSIS — Z170 Estrogen receptor positive status [ER+]: Secondary | ICD-10-CM | POA: Diagnosis not present

## 2022-12-30 DIAGNOSIS — C50912 Malignant neoplasm of unspecified site of left female breast: Secondary | ICD-10-CM | POA: Diagnosis not present

## 2022-12-30 DIAGNOSIS — Z171 Estrogen receptor negative status [ER-]: Secondary | ICD-10-CM | POA: Diagnosis not present

## 2022-12-30 DIAGNOSIS — C779 Secondary and unspecified malignant neoplasm of lymph node, unspecified: Secondary | ICD-10-CM | POA: Diagnosis not present

## 2022-12-30 DIAGNOSIS — C50212 Malignant neoplasm of upper-inner quadrant of left female breast: Secondary | ICD-10-CM | POA: Diagnosis not present

## 2022-12-30 DIAGNOSIS — C7802 Secondary malignant neoplasm of left lung: Secondary | ICD-10-CM | POA: Diagnosis not present

## 2022-12-30 DIAGNOSIS — Z510 Encounter for antineoplastic radiation therapy: Secondary | ICD-10-CM | POA: Diagnosis not present

## 2022-12-30 DIAGNOSIS — C7801 Secondary malignant neoplasm of right lung: Secondary | ICD-10-CM | POA: Diagnosis not present

## 2022-12-30 LAB — RAD ONC ARIA SESSION SUMMARY
Course Elapsed Days: 1
Plan Fractions Treated to Date: 2
Plan Prescribed Dose Per Fraction: 7.5 Gy
Plan Total Fractions Prescribed: 8
Plan Total Prescribed Dose: 60 Gy
Reference Point Dosage Given to Date: 15 Gy
Reference Point Session Dosage Given: 7.5 Gy
Session Number: 2

## 2022-12-31 ENCOUNTER — Other Ambulatory Visit: Payer: Self-pay

## 2022-12-31 ENCOUNTER — Ambulatory Visit
Admission: RE | Admit: 2022-12-31 | Discharge: 2022-12-31 | Disposition: A | Discharge status: Home / Self Care | Payer: 59 | Source: Ambulatory Visit | Attending: Radiation Oncology | Admitting: Radiation Oncology

## 2022-12-31 DIAGNOSIS — C779 Secondary and unspecified malignant neoplasm of lymph node, unspecified: Secondary | ICD-10-CM | POA: Diagnosis not present

## 2022-12-31 DIAGNOSIS — C50912 Malignant neoplasm of unspecified site of left female breast: Secondary | ICD-10-CM | POA: Diagnosis not present

## 2022-12-31 DIAGNOSIS — Z170 Estrogen receptor positive status [ER+]: Secondary | ICD-10-CM | POA: Diagnosis not present

## 2022-12-31 DIAGNOSIS — C50212 Malignant neoplasm of upper-inner quadrant of left female breast: Secondary | ICD-10-CM | POA: Diagnosis not present

## 2022-12-31 DIAGNOSIS — Z510 Encounter for antineoplastic radiation therapy: Secondary | ICD-10-CM | POA: Diagnosis not present

## 2022-12-31 DIAGNOSIS — C7802 Secondary malignant neoplasm of left lung: Secondary | ICD-10-CM | POA: Diagnosis not present

## 2022-12-31 DIAGNOSIS — Z171 Estrogen receptor negative status [ER-]: Secondary | ICD-10-CM | POA: Diagnosis not present

## 2022-12-31 DIAGNOSIS — C7801 Secondary malignant neoplasm of right lung: Secondary | ICD-10-CM | POA: Diagnosis not present

## 2022-12-31 LAB — RAD ONC ARIA SESSION SUMMARY
Course Elapsed Days: 2
Plan Fractions Treated to Date: 3
Plan Prescribed Dose Per Fraction: 7.5 Gy
Plan Total Fractions Prescribed: 8
Plan Total Prescribed Dose: 60 Gy
Reference Point Dosage Given to Date: 22.5 Gy
Reference Point Session Dosage Given: 7.5 Gy
Session Number: 3

## 2023-01-01 ENCOUNTER — Ambulatory Visit
Admission: RE | Admit: 2023-01-01 | Discharge: 2023-01-01 | Disposition: A | Discharge status: Home / Self Care | Payer: 59 | Source: Ambulatory Visit | Attending: Radiation Oncology | Admitting: Radiation Oncology

## 2023-01-01 ENCOUNTER — Other Ambulatory Visit: Payer: Self-pay

## 2023-01-01 DIAGNOSIS — C7802 Secondary malignant neoplasm of left lung: Secondary | ICD-10-CM | POA: Diagnosis not present

## 2023-01-01 DIAGNOSIS — Z510 Encounter for antineoplastic radiation therapy: Secondary | ICD-10-CM | POA: Diagnosis not present

## 2023-01-01 DIAGNOSIS — Z171 Estrogen receptor negative status [ER-]: Secondary | ICD-10-CM | POA: Diagnosis not present

## 2023-01-01 DIAGNOSIS — C50212 Malignant neoplasm of upper-inner quadrant of left female breast: Secondary | ICD-10-CM | POA: Diagnosis not present

## 2023-01-01 DIAGNOSIS — Z170 Estrogen receptor positive status [ER+]: Secondary | ICD-10-CM | POA: Diagnosis not present

## 2023-01-01 DIAGNOSIS — C7801 Secondary malignant neoplasm of right lung: Secondary | ICD-10-CM | POA: Diagnosis not present

## 2023-01-01 DIAGNOSIS — C779 Secondary and unspecified malignant neoplasm of lymph node, unspecified: Secondary | ICD-10-CM | POA: Diagnosis not present

## 2023-01-01 DIAGNOSIS — C50912 Malignant neoplasm of unspecified site of left female breast: Secondary | ICD-10-CM | POA: Diagnosis not present

## 2023-01-01 LAB — RAD ONC ARIA SESSION SUMMARY
Course Elapsed Days: 3
Plan Fractions Treated to Date: 4
Plan Prescribed Dose Per Fraction: 7.5 Gy
Plan Total Fractions Prescribed: 8
Plan Total Prescribed Dose: 60 Gy
Reference Point Dosage Given to Date: 30 Gy
Reference Point Session Dosage Given: 7.5 Gy
Session Number: 4

## 2023-01-02 ENCOUNTER — Other Ambulatory Visit: Payer: Self-pay | Admitting: Radiation Oncology

## 2023-01-02 ENCOUNTER — Ambulatory Visit
Admission: RE | Admit: 2023-01-02 | Discharge: 2023-01-02 | Disposition: A | Discharge status: Home / Self Care | Payer: 59 | Source: Ambulatory Visit | Attending: Radiation Oncology | Admitting: Radiation Oncology

## 2023-01-02 ENCOUNTER — Other Ambulatory Visit: Payer: Self-pay

## 2023-01-02 DIAGNOSIS — Z171 Estrogen receptor negative status [ER-]: Secondary | ICD-10-CM | POA: Diagnosis not present

## 2023-01-02 DIAGNOSIS — Z510 Encounter for antineoplastic radiation therapy: Secondary | ICD-10-CM | POA: Diagnosis not present

## 2023-01-02 DIAGNOSIS — C7801 Secondary malignant neoplasm of right lung: Secondary | ICD-10-CM | POA: Diagnosis not present

## 2023-01-02 DIAGNOSIS — C50912 Malignant neoplasm of unspecified site of left female breast: Secondary | ICD-10-CM | POA: Diagnosis not present

## 2023-01-02 DIAGNOSIS — C7802 Secondary malignant neoplasm of left lung: Secondary | ICD-10-CM | POA: Diagnosis not present

## 2023-01-02 DIAGNOSIS — C50212 Malignant neoplasm of upper-inner quadrant of left female breast: Secondary | ICD-10-CM | POA: Diagnosis not present

## 2023-01-02 DIAGNOSIS — C779 Secondary and unspecified malignant neoplasm of lymph node, unspecified: Secondary | ICD-10-CM | POA: Diagnosis not present

## 2023-01-02 DIAGNOSIS — Z170 Estrogen receptor positive status [ER+]: Secondary | ICD-10-CM | POA: Diagnosis not present

## 2023-01-02 LAB — RAD ONC ARIA SESSION SUMMARY
Course Elapsed Days: 4
Plan Fractions Treated to Date: 5
Plan Prescribed Dose Per Fraction: 7.5 Gy
Plan Total Fractions Prescribed: 8
Plan Total Prescribed Dose: 60 Gy
Reference Point Dosage Given to Date: 37.5 Gy
Reference Point Session Dosage Given: 7.5 Gy
Session Number: 5

## 2023-01-02 MED ORDER — PROCHLORPERAZINE MALEATE 10 MG PO TABS: 10.0000 mg | ORAL_TABLET | Freq: Three times a day (TID) | ORAL | 1 refills | Status: DC | PRN

## 2023-01-05 ENCOUNTER — Other Ambulatory Visit: Payer: Self-pay

## 2023-01-05 ENCOUNTER — Ambulatory Visit
Admission: RE | Admit: 2023-01-05 | Discharge: 2023-01-05 | Disposition: A | Discharge status: Home / Self Care | Payer: 59 | Source: Ambulatory Visit | Attending: Radiation Oncology | Admitting: Radiation Oncology

## 2023-01-05 DIAGNOSIS — Z170 Estrogen receptor positive status [ER+]: Secondary | ICD-10-CM | POA: Diagnosis not present

## 2023-01-05 DIAGNOSIS — C7801 Secondary malignant neoplasm of right lung: Secondary | ICD-10-CM | POA: Diagnosis not present

## 2023-01-05 DIAGNOSIS — Z510 Encounter for antineoplastic radiation therapy: Secondary | ICD-10-CM | POA: Diagnosis not present

## 2023-01-05 DIAGNOSIS — C779 Secondary and unspecified malignant neoplasm of lymph node, unspecified: Secondary | ICD-10-CM | POA: Diagnosis not present

## 2023-01-05 DIAGNOSIS — C50212 Malignant neoplasm of upper-inner quadrant of left female breast: Secondary | ICD-10-CM | POA: Diagnosis not present

## 2023-01-05 DIAGNOSIS — C50912 Malignant neoplasm of unspecified site of left female breast: Secondary | ICD-10-CM | POA: Diagnosis not present

## 2023-01-05 DIAGNOSIS — Z171 Estrogen receptor negative status [ER-]: Secondary | ICD-10-CM | POA: Diagnosis not present

## 2023-01-05 DIAGNOSIS — C7802 Secondary malignant neoplasm of left lung: Secondary | ICD-10-CM | POA: Diagnosis not present

## 2023-01-05 LAB — RAD ONC ARIA SESSION SUMMARY
Course Elapsed Days: 7
Plan Fractions Treated to Date: 6
Plan Prescribed Dose Per Fraction: 7.5 Gy
Plan Total Fractions Prescribed: 8
Plan Total Prescribed Dose: 60 Gy
Reference Point Dosage Given to Date: 45 Gy
Reference Point Session Dosage Given: 7.5 Gy
Session Number: 6

## 2023-01-06 ENCOUNTER — Other Ambulatory Visit: Payer: Self-pay

## 2023-01-06 ENCOUNTER — Ambulatory Visit
Admission: RE | Admit: 2023-01-06 | Discharge: 2023-01-06 | Disposition: A | Discharge status: Home / Self Care | Payer: 59 | Source: Ambulatory Visit | Attending: Radiation Oncology | Admitting: Radiation Oncology

## 2023-01-06 DIAGNOSIS — C779 Secondary and unspecified malignant neoplasm of lymph node, unspecified: Secondary | ICD-10-CM | POA: Diagnosis not present

## 2023-01-06 DIAGNOSIS — Z171 Estrogen receptor negative status [ER-]: Secondary | ICD-10-CM | POA: Diagnosis not present

## 2023-01-06 DIAGNOSIS — C7801 Secondary malignant neoplasm of right lung: Secondary | ICD-10-CM | POA: Diagnosis not present

## 2023-01-06 DIAGNOSIS — C7802 Secondary malignant neoplasm of left lung: Secondary | ICD-10-CM | POA: Diagnosis not present

## 2023-01-06 DIAGNOSIS — Z170 Estrogen receptor positive status [ER+]: Secondary | ICD-10-CM | POA: Diagnosis not present

## 2023-01-06 DIAGNOSIS — C50212 Malignant neoplasm of upper-inner quadrant of left female breast: Secondary | ICD-10-CM | POA: Diagnosis not present

## 2023-01-06 DIAGNOSIS — C50912 Malignant neoplasm of unspecified site of left female breast: Secondary | ICD-10-CM | POA: Diagnosis not present

## 2023-01-06 DIAGNOSIS — Z510 Encounter for antineoplastic radiation therapy: Secondary | ICD-10-CM | POA: Diagnosis not present

## 2023-01-06 LAB — RAD ONC ARIA SESSION SUMMARY
Course Elapsed Days: 8
Plan Fractions Treated to Date: 7
Plan Prescribed Dose Per Fraction: 7.5 Gy
Plan Total Fractions Prescribed: 8
Plan Total Prescribed Dose: 60 Gy
Reference Point Dosage Given to Date: 52.5 Gy
Reference Point Session Dosage Given: 7.5 Gy
Session Number: 7

## 2023-01-07 ENCOUNTER — Other Ambulatory Visit: Payer: Self-pay

## 2023-01-07 ENCOUNTER — Ambulatory Visit: Payer: 59

## 2023-01-07 ENCOUNTER — Ambulatory Visit
Admission: RE | Admit: 2023-01-07 | Discharge: 2023-01-07 | Disposition: A | Discharge status: Home / Self Care | Payer: 59 | Source: Ambulatory Visit | Attending: Radiation Oncology | Admitting: Radiation Oncology

## 2023-01-07 DIAGNOSIS — C7802 Secondary malignant neoplasm of left lung: Secondary | ICD-10-CM | POA: Diagnosis not present

## 2023-01-07 DIAGNOSIS — C779 Secondary and unspecified malignant neoplasm of lymph node, unspecified: Secondary | ICD-10-CM | POA: Diagnosis not present

## 2023-01-07 DIAGNOSIS — Z171 Estrogen receptor negative status [ER-]: Secondary | ICD-10-CM | POA: Diagnosis not present

## 2023-01-07 DIAGNOSIS — Z510 Encounter for antineoplastic radiation therapy: Secondary | ICD-10-CM | POA: Diagnosis not present

## 2023-01-07 DIAGNOSIS — Z170 Estrogen receptor positive status [ER+]: Secondary | ICD-10-CM | POA: Diagnosis not present

## 2023-01-07 DIAGNOSIS — C50912 Malignant neoplasm of unspecified site of left female breast: Secondary | ICD-10-CM | POA: Diagnosis not present

## 2023-01-07 DIAGNOSIS — C50212 Malignant neoplasm of upper-inner quadrant of left female breast: Secondary | ICD-10-CM | POA: Diagnosis not present

## 2023-01-07 DIAGNOSIS — C7801 Secondary malignant neoplasm of right lung: Secondary | ICD-10-CM | POA: Diagnosis not present

## 2023-01-07 LAB — RAD ONC ARIA SESSION SUMMARY
Course Elapsed Days: 9
Plan Fractions Treated to Date: 8
Plan Prescribed Dose Per Fraction: 7.5 Gy
Plan Total Fractions Prescribed: 8
Plan Total Prescribed Dose: 60 Gy
Reference Point Dosage Given to Date: 60 Gy
Reference Point Session Dosage Given: 7.5 Gy
Session Number: 8

## 2023-01-08 ENCOUNTER — Other Ambulatory Visit: Payer: Self-pay | Admitting: Nurse Practitioner

## 2023-01-08 DIAGNOSIS — C50919 Malignant neoplasm of unspecified site of unspecified female breast: Secondary | ICD-10-CM

## 2023-01-08 DIAGNOSIS — Z515 Encounter for palliative care: Secondary | ICD-10-CM

## 2023-01-08 DIAGNOSIS — R110 Nausea: Secondary | ICD-10-CM

## 2023-01-08 DIAGNOSIS — I5020 Unspecified systolic (congestive) heart failure: Secondary | ICD-10-CM | POA: Diagnosis not present

## 2023-01-08 DIAGNOSIS — M25569 Pain in unspecified knee: Secondary | ICD-10-CM | POA: Diagnosis not present

## 2023-01-08 DIAGNOSIS — R062 Wheezing: Secondary | ICD-10-CM | POA: Diagnosis not present

## 2023-01-08 DIAGNOSIS — U071 COVID-19: Secondary | ICD-10-CM | POA: Diagnosis not present

## 2023-01-08 MED ORDER — ONDANSETRON HCL 8 MG PO TABS
8.0000 mg | ORAL_TABLET | Freq: Three times a day (TID) | ORAL | 2 refills | Status: DC | PRN
Start: 2023-01-08 — End: 2023-09-21

## 2023-01-08 NOTE — Telephone Encounter (Signed)
From: [name] To: Mayra Reel, NP Sent: 01/08/2023 1:37 PM EDT Subject: Medication Renewal Request  Refills have been requested for the following medications:   ondansetron (ZOFRAN) 8 MG tablet [name]]  Preferred pharmacy: [id number] [place], [place] - [address] Delivery method: Daryll Drown

## 2023-01-09 ENCOUNTER — Telehealth: Payer: Self-pay | Admitting: Hematology and Oncology

## 2023-01-09 DIAGNOSIS — U071 COVID-19: Secondary | ICD-10-CM | POA: Diagnosis not present

## 2023-01-09 DIAGNOSIS — I5020 Unspecified systolic (congestive) heart failure: Secondary | ICD-10-CM | POA: Diagnosis not present

## 2023-01-09 DIAGNOSIS — M25569 Pain in unspecified knee: Secondary | ICD-10-CM | POA: Diagnosis not present

## 2023-01-09 DIAGNOSIS — R062 Wheezing: Secondary | ICD-10-CM | POA: Diagnosis not present

## 2023-01-09 NOTE — Telephone Encounter (Signed)
Rescheduled and scheduled appointments per incominf call and WQ. Patient is aware of the made appointments.

## 2023-01-14 ENCOUNTER — Other Ambulatory Visit: Payer: Self-pay | Admitting: Family Medicine

## 2023-01-14 DIAGNOSIS — E1149 Type 2 diabetes mellitus with other diabetic neurological complication: Secondary | ICD-10-CM

## 2023-01-15 ENCOUNTER — Inpatient Hospital Stay: Payer: 59 | Admitting: Nurse Practitioner

## 2023-01-15 ENCOUNTER — Inpatient Hospital Stay: Payer: 59

## 2023-01-15 ENCOUNTER — Ambulatory Visit (HOSPITAL_COMMUNITY)
Admission: RE | Admit: 2023-01-15 | Discharge: 2023-01-15 | Disposition: A | Discharge status: Home / Self Care | Payer: 59 | Source: Ambulatory Visit | Attending: Adult Health | Admitting: Adult Health

## 2023-01-15 ENCOUNTER — Other Ambulatory Visit: Payer: Self-pay

## 2023-01-15 ENCOUNTER — Inpatient Hospital Stay (HOSPITAL_BASED_OUTPATIENT_CLINIC_OR_DEPARTMENT_OTHER): Payer: 59 | Admitting: Hematology and Oncology

## 2023-01-15 VITALS — BP 153/88 | HR 66 | Resp 20

## 2023-01-15 DIAGNOSIS — C7801 Secondary malignant neoplasm of right lung: Secondary | ICD-10-CM | POA: Diagnosis not present

## 2023-01-15 DIAGNOSIS — C50919 Malignant neoplasm of unspecified site of unspecified female breast: Secondary | ICD-10-CM

## 2023-01-15 DIAGNOSIS — Z170 Estrogen receptor positive status [ER+]: Secondary | ICD-10-CM | POA: Diagnosis not present

## 2023-01-15 DIAGNOSIS — Z87891 Personal history of nicotine dependence: Secondary | ICD-10-CM | POA: Diagnosis not present

## 2023-01-15 DIAGNOSIS — Z86718 Personal history of other venous thrombosis and embolism: Secondary | ICD-10-CM | POA: Diagnosis not present

## 2023-01-15 DIAGNOSIS — G4730 Sleep apnea, unspecified: Secondary | ICD-10-CM | POA: Diagnosis not present

## 2023-01-15 DIAGNOSIS — M549 Dorsalgia, unspecified: Secondary | ICD-10-CM | POA: Diagnosis not present

## 2023-01-15 DIAGNOSIS — C50212 Malignant neoplasm of upper-inner quadrant of left female breast: Secondary | ICD-10-CM | POA: Insufficient documentation

## 2023-01-15 DIAGNOSIS — Z79811 Long term (current) use of aromatase inhibitors: Secondary | ICD-10-CM | POA: Diagnosis not present

## 2023-01-15 DIAGNOSIS — K869 Disease of pancreas, unspecified: Secondary | ICD-10-CM | POA: Diagnosis not present

## 2023-01-15 DIAGNOSIS — C7802 Secondary malignant neoplasm of left lung: Secondary | ICD-10-CM | POA: Diagnosis not present

## 2023-01-15 DIAGNOSIS — Z0189 Encounter for other specified special examinations: Secondary | ICD-10-CM

## 2023-01-15 DIAGNOSIS — C7800 Secondary malignant neoplasm of unspecified lung: Secondary | ICD-10-CM | POA: Diagnosis not present

## 2023-01-15 DIAGNOSIS — Z803 Family history of malignant neoplasm of breast: Secondary | ICD-10-CM | POA: Diagnosis not present

## 2023-01-15 DIAGNOSIS — G8929 Other chronic pain: Secondary | ICD-10-CM | POA: Diagnosis not present

## 2023-01-15 DIAGNOSIS — Z95828 Presence of other vascular implants and grafts: Secondary | ICD-10-CM

## 2023-01-15 DIAGNOSIS — Z01818 Encounter for other preprocedural examination: Secondary | ICD-10-CM | POA: Insufficient documentation

## 2023-01-15 DIAGNOSIS — I10 Essential (primary) hypertension: Secondary | ICD-10-CM | POA: Insufficient documentation

## 2023-01-15 DIAGNOSIS — Z5112 Encounter for antineoplastic immunotherapy: Secondary | ICD-10-CM | POA: Diagnosis not present

## 2023-01-15 DIAGNOSIS — E119 Type 2 diabetes mellitus without complications: Secondary | ICD-10-CM | POA: Insufficient documentation

## 2023-01-15 LAB — CBC WITH DIFFERENTIAL (CANCER CENTER ONLY)
Abs Immature Granulocytes: 0.01 10*3/uL (ref 0.00–0.07)
Basophils Absolute: 0 10*3/uL (ref 0.0–0.1)
Basophils Relative: 0 %
Eosinophils Absolute: 0.1 10*3/uL (ref 0.0–0.5)
Eosinophils Relative: 2 %
HCT: 41.8 % (ref 36.0–46.0)
Hemoglobin: 13.9 g/dL (ref 12.0–15.0)
Immature Granulocytes: 0 %
Lymphocytes Relative: 32 %
Lymphs Abs: 1.6 10*3/uL (ref 0.7–4.0)
MCH: 29.4 pg (ref 26.0–34.0)
MCHC: 33.3 g/dL (ref 30.0–36.0)
MCV: 88.6 fL (ref 80.0–100.0)
Monocytes Absolute: 0.4 10*3/uL (ref 0.1–1.0)
Monocytes Relative: 8 %
Neutro Abs: 2.9 10*3/uL (ref 1.7–7.7)
Neutrophils Relative %: 58 %
Platelet Count: 196 10*3/uL (ref 150–400)
RBC: 4.72 MIL/uL (ref 3.87–5.11)
RDW: 13.9 % (ref 11.5–15.5)
WBC Count: 5.2 10*3/uL (ref 4.0–10.5)
nRBC: 0 % (ref 0.0–0.2)

## 2023-01-15 LAB — ECHOCARDIOGRAM COMPLETE
AR max vel: 2.21 cm2
AV Area VTI: 2.16 cm2
AV Area mean vel: 2.03 cm2
AV Mean grad: 4 mmHg
AV Peak grad: 7.6 mmHg
Ao pk vel: 1.38 m/s
Area-P 1/2: 2.93 cm2
Calc EF: 60.4 %
Height: 64 in
MV VTI: 2.41 cm2
S' Lateral: 3 cm
Single Plane A2C EF: 63.9 %
Single Plane A4C EF: 63.1 %
Weight: 4834 oz

## 2023-01-15 LAB — CMP (CANCER CENTER ONLY)
ALT: 60 U/L — ABNORMAL HIGH (ref 0–44)
AST: 44 U/L — ABNORMAL HIGH (ref 15–41)
Albumin: 3.9 g/dL (ref 3.5–5.0)
Alkaline Phosphatase: 77 U/L (ref 38–126)
Anion gap: 5 (ref 5–15)
BUN: 16 mg/dL (ref 8–23)
CO2: 31 mmol/L (ref 22–32)
Calcium: 9.4 mg/dL (ref 8.9–10.3)
Chloride: 105 mmol/L (ref 98–111)
Creatinine: 0.64 mg/dL (ref 0.44–1.00)
GFR, Estimated: >60 mL/min (ref >60)
Glucose, Bld: 130 mg/dL — ABNORMAL HIGH (ref 70–99)
Potassium: 4.3 mmol/L (ref 3.5–5.1)
Sodium: 141 mmol/L (ref 135–145)
Total Bilirubin: 0.5 mg/dL (ref 0.3–1.2)
Total Protein: 7.7 g/dL (ref 6.5–8.1)

## 2023-01-15 MED ORDER — ACETAMINOPHEN 325 MG PO TABS
650.0000 mg | ORAL_TABLET | Freq: Once | ORAL | Status: AC
  Administered 2023-01-15: 650 mg via ORAL
  Filled 2023-01-15: qty 2

## 2023-01-15 MED ORDER — OZEMPIC (2 MG/DOSE) 8 MG/3ML ~~LOC~~ SOPN
2.0000 mg | PEN_INJECTOR | SUBCUTANEOUS | 6 refills | Status: DC
Start: 2023-01-15 — End: 2023-07-29
  Filled 2023-01-15: qty 3, 28d supply, fill #0
  Filled 2023-01-15 – 2023-02-07 (×2): qty 3, 28d supply, fill #1
  Filled 2023-03-09: qty 3, 28d supply, fill #2
  Filled 2023-04-06: qty 3, 28d supply, fill #3
  Filled 2023-05-05: qty 3, 28d supply, fill #4
  Filled 2023-05-29: qty 3, 28d supply, fill #5
  Filled 2023-06-29: qty 3, 28d supply, fill #6

## 2023-01-15 MED ORDER — DIPHENHYDRAMINE HCL 25 MG PO CAPS
50.0000 mg | ORAL_CAPSULE | Freq: Once | ORAL | Status: AC
  Administered 2023-01-15: 50 mg via ORAL
  Filled 2023-01-15: qty 2

## 2023-01-15 MED ORDER — TRASTUZUMAB-ANNS CHEMO 150 MG IV SOLR
750.0000 mg | Freq: Once | INTRAVENOUS | Status: AC
  Administered 2023-01-15: 750 mg via INTRAVENOUS
  Filled 2023-01-15: qty 35.71

## 2023-01-15 MED ORDER — HEPARIN SOD (PORK) LOCK FLUSH 100 UNIT/ML IV SOLN
500.0000 [IU] | Freq: Once | INTRAVENOUS | Status: AC | PRN
  Administered 2023-01-15: 500 [IU]

## 2023-01-15 MED ORDER — SODIUM CHLORIDE 0.9% FLUSH
10.0000 mL | Freq: Once | INTRAVENOUS | Status: AC
  Administered 2023-01-15: 10 mL

## 2023-01-15 MED ORDER — SODIUM CHLORIDE 0.9% FLUSH
10.0000 mL | INTRAVENOUS | Status: DC | PRN
  Administered 2023-01-15: 10 mL

## 2023-01-15 MED ORDER — SODIUM CHLORIDE 0.9 % IV SOLN: Freq: Once | INTRAVENOUS | Status: AC

## 2023-01-15 MED ORDER — LORAZEPAM 2 MG/ML IJ SOLN
0.5000 mg | Freq: Once | INTRAMUSCULAR | Status: AC
  Administered 2023-01-15: 0.5 mg via INTRAVENOUS
  Filled 2023-01-15: qty 1

## 2023-01-15 NOTE — Progress Notes (Signed)
Per Dr.Iruku OK to proceed with ECHO from 08/01/2023, left EF 60-65%. OK to proceed with premedications for tx prior to ECHO being obtained in infusion today.

## 2023-01-15 NOTE — Progress Notes (Signed)
[facility] Cancer Follow up:    Jenna Living, MD [address] 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 [facility]) Staging form: Breast, AJCC 7th Edition - Clinical: Stage IV (TX, NX, M1) - Signed by Lowella Dell, MD on 04/17/2015  Malignant neoplasm of upper-inner quadrant of left breast in female, estrogen receptor positive (HCC) Staging form: Breast, AJCC 7th Edition - Clinical: Stage IA (T1c, N0, cM0) - Unsigned Specimen type: Core Needle Biopsy Histopathologic type: 9931 Laterality: Left Staging comments: Staged at breast conference 4.2.14  - Pathologic: Stage IV (M1) - Unsigned Specimen type: Core Needle Biopsy Histopathologic type: 9931 Laterality: Left   SUMMARY OF ONCOLOGIC HISTORY: 72 y.o. [name] with stage IV breast cancer initially diagnosed March 2014    (1) s/p left breast upper inner quadrant biopsy 11/05/2012 for a clinical T1c NX M1, stage IV invasive ductal carcinoma, grade 3, estrogen and progesterone receptor negative, with an MIB-1 of 77%, and HER-2 amplified by CISH with a ratio of 4.39.             (a) mammography 04/16/2016 shows the left breast mass to have nearly completely resolved   (2) chest, abdomen and pelvis CT scans and PET scan April 2014 showed multiple bilateral pulmonaru nodules but no liver or bone involvement; biopsy of a pulmonary nodule on 11/30/2012 confirmed metastatic breast cancer.              (a) CT in GI obtained 09/28/2014 shows no measurable disease in the lungs             (b) chest CT 12/20/2015 showed stable small right lung pulmonary nodules and an area in the right lower lobe pleural parenchymal thickness requiring attention in future studies              (3) received docetaxel / trastuzumab/ pertuzumab x4, completed 02/07/2013, with a good response,    (4) trastuzumab/ pertuzumab continued every 21 days;             (a) Pertuzumab  discontinued after 07/28/2016, and Trastuzumab given every 4 weeks starting 08/2016             (b) most recent echocardiogram 06/28/2018 shows EF of 60-65% (these will be every 6 months)   (5) anastrozole started 02/15/2013, discontinued October 2014 with poor tolerance   (6) 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   CURRENT THERAPY: Letrozole, Herceptin  INTERVAL HISTORY:  [name] 72 y.o. female returns for follow-up prior to receiving Herceptin therapy.  She completed palliative radiation last week. She tells me that she would never ever want to do that again.  From radiation, she felt a bit nauseated but she is reluctant to take any nausea medication.  She states this is already getting better.  She also felt fatigued from the radiation, overall making a good recovery. She once again has severe back pain which is her main complaint on most of her visits.  She is here today with her daughter [name] and her grandson Guttierrez. Rest of the pertinent 10 point ROS reviewed and neg.  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  MEDICAL  HISTORY: Past Medical History:  Diagnosis Date   Arthritis    Back pain    Breast cancer (HCC) dx'd 11/2012   left   Chest pain    COPD (chronic obstructive pulmonary disease) (HCC)    Diabetes mellitus without complication (HCC) 09/28/2014   Ear pain    Fatty liver 6/03   Hypertension    Lung disease    Lung metastases dx'd 11/2012   Obesity, unspecified    Other abnormal glucose    Suicide attempt (HCC) 1996   Syncope and collapse    Unspecified sleep apnea     SURGICAL HISTORY: Past Surgical History:  Procedure Laterality Date   CARDIAC CATHETERIZATION     2007   CHOLECYSTECTOMY     TUBAL LIGATION      SOCIAL HISTORY: Social History   Socioeconomic History   Marital status: Divorced    Spouse name: Not on file   Number of children: 4   Years of education: 12   Highest education level: 12th grade  Occupational History   Occupation: Disability   Occupation: retired-daycare  Tobacco Use   Smoking status: Former    Packs/day: 3.00    Years: 5.00    Additional pack years: 0.00    Total pack years: 15.00    Types: Cigarettes    Quit date: 08/18/1968    Years since quitting: 54.4    Passive exposure: Past   Smokeless tobacco: Never  Vaping Use   Vaping Use: Never used  Substance and Sexual Activity   Alcohol use: No    Alcohol/week: 0.0 standard drinks of alcohol   Drug use: Yes    Types: Marijuana    Comment: Medical Marijuana   Sexual activity: Not Currently    Partners: Male  Other Topics Concern   Not on file  Social History Narrative   Health Care POA:    Emergency Contact: Amador Cunas [phone]   End of Life Plan:    Who lives with you: two grandchildren, friend, daughter   Any pets: 3 poodles   Diet: Pt has a variety of protein, starch, vegetables.  Pt is currently working on cutting back on portions for weight loss.   Exercise: Pt has not regular exercise routine.  Occasionally walks around home.   Seatbelts: Pt reports wearing seatbelt  occasionally.   Sun Exposure/Protection: Pt does not use sun protection   Hobbies: reading, playing on kindle, ebay         Currently in her home she keeps her granddaughter [name], 16, who is the daughter of the patient's daughter Para March (the patient refers to [name] as "my adopted daughter"); grandson Polega "[name]" Rim, [place], who is [name]'s half-brother; daughter [name], and an [place] friend, [name], the patient's significant other. Daughter [name] is a Public librarian, currently unemployed. Son [name] works as an Personnel officer in [place]. Daughter Para March is currently in prison due to killing someone in a  car accident. Daughter [name] died from aplastic anemia at the age of 73. The patient has a total of 4 grandchildren. She is not a church attender   Social Determinants of Health   Financial Resource Strain: Medium Risk (07/30/2022)   Overall Financial Resource Strain (CARDIA)    Difficulty of Paying Moss Expenses: Somewhat hard  Food Insecurity: No Food Insecurity (07/30/2022)   Hunger Vital Sign    Worried About Running Out of Food in the Last Year: Never true    Ran Out of Food in the Last Year: Never true  Transportation Needs: No Transportation Needs (07/30/2022)   PRAPARE - Administrator, Civil Service (Medical): No    Lack of Transportation (Non-Medical): No  Physical Activity: Inactive (07/30/2022)   Exercise Vital Sign    Days of Exercise per Week: 0 days    Minutes of Exercise per Session: 0 min  Stress: No Stress Concern Present (07/30/2022)   [employer] of Occupational Health - Occupational Stress Questionnaire    Feeling of Stress : Only a little  Social Connections: Moderately Integrated (07/30/2022)   Social Connection and Isolation Panel [NHANES]    Frequency of Communication with Friends and Family: More than three times a week    Frequency of Social Gatherings with Friends and Family: More  than three times a week    Attends Religious Services: More than 4 times per year    Active Member of [employer] or Organizations: Yes    Attends Engineer, structural: More than 4 times per year    Marital Status: Divorced  Intimate Partner Violence: Not At Risk (07/30/2022)   Humiliation, Afraid, Rape, and Kick questionnaire    Fear of Current or Ex-Partner: No    Emotionally Abused: No    Physically Abused: No    Sexually Abused: No    FAMILY HISTORY: Family History  Problem Relation Age of Onset   Coronary artery disease Father 67   Diabetes Father    Heart disease Father    Breast cancer Mother 36   Cancer Mother 109       breast   Aplastic anemia Daughter        died at age 73   Cancer Maternal Aunt 63       ovarian   Cancer Maternal Grandmother 14       ovarian   Cancer Paternal Aunt 51       ovarian/breast/breast   Coronary artery disease Sister 71   Coronary artery disease Brother 16    Review of Systems  Constitutional:  Negative for appetite change, chills, fatigue, fever and unexpected weight change.  HENT:   Negative for hearing loss, lump/mass and trouble swallowing.   Eyes:  Negative for eye problems and icterus.  Respiratory:  Negative for chest tightness, cough and shortness of breath.   Cardiovascular:  Negative for chest pain, leg swelling and palpitations.  Gastrointestinal:  Negative for abdominal distention, abdominal pain, constipation, diarrhea, nausea and vomiting.  Endocrine: Negative for hot flashes.  Genitourinary:  Negative for difficulty urinating.   Musculoskeletal:  Positive for back pain. Negative for arthralgias.  Skin:  Negative for itching and rash.  Neurological:  Negative for dizziness, extremity weakness, headaches and numbness.  Hematological:  Negative for adenopathy. Does not bruise/bleed easily.  Psychiatric/Behavioral:  Negative for depression. The patient is not nervous/anxious.       PHYSICAL EXAMINATION  ECOG  PERFORMANCE STATUS: 3 - Symptomatic, >50% confined to bed  Vitals:   01/15/23 1326  BP: (!) 153/85  Pulse: 67  Resp: 19  Temp: 97.7 F (36.5 C)  SpO2: 98%    Physical Exam Constitutional:      General: She is not in acute distress.    Appearance: Normal appearance. She is not toxic-appearing.  HENT:     Head: Normocephalic and atraumatic.  Eyes:     General: No scleral icterus. Cardiovascular:     Rate and Rhythm: Normal rate and regular rhythm.     Pulses: Normal pulses.     Heart sounds: Normal heart sounds.  Pulmonary:     Effort: Pulmonary effort is normal.     Breath sounds: Normal breath sounds.  Abdominal:     General: Abdomen is flat. Bowel sounds are normal. There is no distension.     Palpations: Abdomen is soft.     Tenderness: There is no abdominal tenderness.  Musculoskeletal:        General: No swelling.     Cervical back: Neck supple.  Lymphadenopathy:     Cervical: No cervical adenopathy.  Skin:    General: Skin is warm and dry.     Findings: No rash.  Neurological:     General: No focal deficit present.     Mental Status: She is alert.  Psychiatric:        Mood and Affect: Mood normal.        Behavior: Behavior normal.     LABORATORY DATA:  CBC    Component Value Date/Time   WBC 5.2 01/15/2023 1306   WBC 8.0 05/10/2020 0447   RBC 4.72 01/15/2023 1306   HGB 13.9 01/15/2023 1306   HGB 12.8 08/05/2017 0927   HCT 41.8 01/15/2023 1306   HCT 39.8 08/05/2017 0927   PLT 196 01/15/2023 1306   PLT 267 08/05/2017 0927   MCV 88.6 01/15/2023 1306   MCV 86.7 08/05/2017 0927   MCH 29.4 01/15/2023 1306   MCHC 33.3 01/15/2023 1306   RDW 13.9 01/15/2023 1306   RDW 14.3 08/05/2017 0927   LYMPHSABS 1.6 01/15/2023 1306   LYMPHSABS 2.3 08/05/2017 0927   MONOABS 0.4 01/15/2023 1306   MONOABS 0.4 08/05/2017 0927   EOSABS 0.1 01/15/2023 1306   EOSABS 0.1 08/05/2017 0927   BASOSABS 0.0 01/15/2023 1306   BASOSABS 0.0 08/05/2017 0927    CMP      Component Value Date/Time   NA 140 12/18/2022 1229   NA 141 08/05/2017 0927   K 4.2 12/18/2022 1229   K 4.0 08/05/2017 0927   CL 102 12/18/2022 1229   CL 101 02/07/2013 1125   CO2 32 12/18/2022 1229   CO2 31 (H) 08/05/2017 0927   GLUCOSE 119 (H) 12/18/2022 1229   GLUCOSE 125 08/05/2017 0927   GLUCOSE 193 (H) 02/07/2013 1125   BUN 12 12/18/2022 1229   BUN 22.7 08/05/2017 0927   CREATININE 0.68 12/18/2022 1229   CREATININE 0.8 08/05/2017 0927   CALCIUM 9.6 12/18/2022 1229   CALCIUM 9.7 08/05/2017 0927   PROT 7.6 12/18/2022 1229   PROT 7.3 08/05/2017 0927   ALBUMIN 3.9 12/18/2022 1229   ALBUMIN 3.2 (L) 08/05/2017 0927   AST 44 (H) 12/18/2022 1229   AST 19 08/05/2017 0927   ALT 57 (H) 12/18/2022 1229   ALT 28 08/05/2017 0927   ALKPHOS 79 12/18/2022 1229   ALKPHOS 108 08/05/2017 0927   BILITOT 0.6 12/18/2022 1229   BILITOT 0.34 08/05/2017 0927   GFRNONAA >60 12/18/2022 1229  GFRAA >60 05/10/2020 0447   GFRAA >60 04/18/2020 1023   ASSESSMENT and THERAPY PLAN:   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   1.  Metastatic breast cancer: Most recent scan with pre vascular adenopathy increasing in size suggestive of progression. Since thi appears to be the only area for progression, we discussed that she could consider palliative radiation and continue herceptin maintenance.  She is now status post radiation.  Will plan to repeat imaging in first week of July to follow-up on the prevascular adenopathy as well as the pancreatic area.  We offered her an MRI but she refused this given the severe back pain.    2.  Chronic back pain: Continue follow-up with palliative care.  3. Low attenuation lesion in uncinate process of pancreas, possibly new. We discussed about MR abdomen. She refused it, she can't stay still with her back pain. We agreed to follow up on repeat CT  imaging in about 3-4 months.   I recommended that she continue on letrozole and receive Herceptin every 4 weeks.  No concerns today.  I have offered her antinausea medication but she does not want to take any new medication at this time.  She can return to clinic every 28 days for Herceptin.  All questions were answered. The patient knows to call the clinic with any problems, questions or concerns. We can certainly see the patient much sooner if necessary.  Total encounter time:30 minutes*in face-to-face visit time, chart review, lab review, care coordination, order entry, and documentation of the encounter time.    *Total Encounter Time as defined by the Centers for Medicare and Medicaid Services includes, in addition to the face-to-face time of a patient visit (documented in the note above) non-face-to-face time: obtaining and reviewing outside history, ordering and reviewing medications, tests or procedures, care coordination (communications with other health care professionals or caregivers) and documentation in the medical record.

## 2023-01-15 NOTE — Progress Notes (Signed)
[name] Cancer Follow up:    Jenna Living, MD [address] 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 [facility]) Staging form: Breast, AJCC 7th Edition - Clinical: Stage IV (TX, NX, M1) - Signed by Jenna Dell, MD on 04/17/2015  Malignant neoplasm of upper-inner quadrant of left breast in female, estrogen receptor positive (HCC) Staging form: Breast, AJCC 7th Edition - Clinical: Stage IA (T1c, N0, cM0) - Unsigned Specimen type: Core Needle Biopsy Histopathologic type: 9931 Laterality: Left Staging comments: Staged at breast conference 4.2.14  - Pathologic: Stage IV (M1) - Unsigned Specimen type: Core Needle Biopsy Histopathologic type: 9931 Laterality: Left   SUMMARY OF ONCOLOGIC HISTORY: 72 y.o. [name] woman with stage IV breast cancer initially diagnosed March 2014    (1) s/p left breast upper inner quadrant biopsy 11/05/2012 for a clinical T1c NX M1, stage IV invasive ductal carcinoma, grade 3, estrogen and progesterone receptor negative, with an MIB-1 of 77%, and HER-2 amplified by C[facility] with a ratio of 4.39.             (a) mammography 04/16/2016 shows the left breast mass to have nearly completely resolved   (2) chest, abdomen and pelvis CT scans and PET scan April 2014 showed multiple bilateral pulmonaru nodules but no liver or bone involvement; biopsy of a pulmonary nodule on 11/30/2012 confirmed metastatic breast cancer.              (a) CT in GI obtained 09/28/2014 shows no measurable disease in the lungs             (b) chest CT 12/20/2015 showed stable small right lung pulmonary nodules and an area in the right lower lobe pleural parenchymal thickness requiring attention in future studies              (3) received docetaxel / trastuzumab/ pertuzumab x4, completed 02/07/2013, with a good response,    (4) trastuzumab/ pertuzumab continued every 21 days;             (a) Pertuzumab  discontinued after 07/28/2016, and Trastuzumab given every 4 weeks starting 08/2016             (b) most recent echocardiogram 06/28/2018 shows EF of 60-65% (these will be every 6 months)   (5) anastrozole started 02/15/2013, discontinued October 2014 with poor tolerance   (6) 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   CURRENT THERAPY: Letrozole, Herceptin  INTERVAL HISTORY:  [name] 72 y.o. female returns for follow-up prior to receiving Herceptin therapy.  She once again mentions that the back pain is unbearable especially when she was getting simulated for radiation.  She had concerns about the pancreatic mass, wondering if this is concerning for cancer. Besides the back pain, she denies any other complaints.  Rest of the pertinent 10 point ROS reviewed and neg.  Patient Active Problem List   Diagnosis Date Noted  . Lightheadedness 11/04/2022  . Sialadenitis   . Port-A-Cath in place 03/21/2020  . Obesity, morbid, BMI 50 or higher (HCC) 10/02/2015  . Diabetes mellitus type 2 with neurological manifestations (HCC) 09/28/2014  . Malignant neoplasm of upper-inner quadrant of left breast in female, estrogen receptor positive (HCC)  05/23/2013  . 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 Anxiety state 10/15/2006  . HYPERTENSION, BENIGN SYSTEMIC 10/15/2006  . Osteoarthritis of spine 10/15/2006  . LUMBAR SPINAL STENOSIS 10/15/2006  . Sleep apnea 10/15/2006    is allergic to meperidine hcl, penicillins, and aspirin.  MEDICAL HISTORY: Past Medical History:  Diagnosis Date  . Arthritis   . Back pain   . Breast cancer (HCC) dx'd 11/2012   left  . Chest pain   . COPD (chronic obstructive pulmonary disease) (HCC)   . Diabetes mellitus  without complication (HCC) 09/28/2014  . Ear pain   . Fatty liver 6/03  . Hypertension   . Lung disease   . Lung metastases dx'd 11/2012  . Obesity, unspecified   . Other abnormal glucose   . Suicide attempt (HCC) 1996  . Syncope and collapse   . Unspecified sleep apnea     SURGICAL HISTORY: Past Surgical History:  Procedure Laterality Date  . CARDIAC CATHETERIZATION     2007  . CHOLECYSTECTOMY    . TUBAL LIGATION      SOCIAL HISTORY: Social History   Socioeconomic History  . Marital status: Divorced    Spouse name: Not on file  . Number of children: 4  . Years of education: 74  . Highest education level: 12th grade  Occupational History  . Occupation: Disability  . Occupation: retired-daycare  Tobacco Use  . Smoking status: Former    Packs/day: 3.00    Years: 5.00    Additional pack years: 0.00    Total pack years: 15.00    Types: Cigarettes    Quit date: 08/18/1968    Years since quitting: 54.4    Passive exposure: Past  . Smokeless tobacco: Never  Vaping Use  . Vaping Use: Never used  Substance and Sexual Activity  . Alcohol use: No    Alcohol/week: 0.0 standard drinks of alcohol  . Drug use: Yes    Types: Marijuana    Comment: Medical Marijuana  . Sexual activity: Not Currently    Partners: Male  Other Topics Concern  . Not on file  Social History Narrative   Health Care POA:    Emergency Contact: Amador Cunas [phone]   End of Life Plan:    Who lives with you: two grandchildren, friend, daughter   Any pets: 3 poodles   Diet: Pt has a variety of protein, starch, vegetables.  Pt is currently working on cutting back on portions for weight loss.   Exercise: Pt has not regular exercise routine.  Occasionally walks around home.   Seatbelts: Pt reports wearing seatbelt occasionally.   Sun Exposure/Protection: Pt does not use sun protection   Hobbies: reading, playing on kindle, ebay         Currently in her home she keeps her granddaughter [name], 16, who is the daughter of the patient's daughter Para March (the patient refers to [name] as "my adopted daughter"); grandson Laur "[name]" Losi, [place], who is [name]'s half-brother; daughter [name], and an [place] friend, [name], the patient's significant other. Daughter [name] is a Public librarian, currently unemployed. Son [name] works as an Personnel officer in [place]. Daughter Para March is currently in prison due to killing someone in a car accident. Daughter [name] died from aplastic anemia at the age of 54. The patient has a total of 4 grandchildren. She is not a church attender   Social Determinants of Health   Financial Resource Strain: Medium Risk (07/30/2022)  Overall Physicist, medical Strain (CARDIA)   . Difficulty of Paying Moss Expenses: Somewhat hard  Food Insecurity: No Food Insecurity (07/30/2022)   Hunger Vital Sign   . Worried About Programme researcher, broadcasting/film/video in the Last Year: Never true   . Ran Out of Food in the Last Year: Never true  Transportation Needs: No Transportation Needs (07/30/2022)   PRAPARE - Transportation   . Lack of Transportation (Medical): No   . Lack of Transportation (Non-Medical): No  Physical Activity: Inactive (07/30/2022)   Exercise Vital Sign   . Days of Exercise per Week: 0 days   . Minutes of Exercise per Session: 0 min  Stress: No Stress Concern Present (07/30/2022)   [employer] of Occupational Health - Occupational Stress Questionnaire   . Feeling of Stress : Only a little  Social Connections: Moderately Integrated (07/30/2022)   Social Connection and Isolation Panel [NHANES]   . Frequency of Communication with Friends and Family: More than three times a week   . Frequency of Social Gatherings with Friends and Family: More than three times a week   . Attends Religious Services: More than 4 times per year   . Active Member of Clubs or Organizations: Yes   . Attends Banker Meetings:  More than 4 times per year   . Marital Status: Divorced  Catering manager Violence: Not At Risk (07/30/2022)   Humiliation, Afraid, Rape, and Kick questionnaire   . Fear of Current or Ex-Partner: No   . Emotionally Abused: No   . Physically Abused: No   . Sexually Abused: No    FAMILY HISTORY: Family History  Problem Relation Age of Onset  . Coronary artery disease Father 75  . Diabetes Father   . Heart disease Father   . Breast cancer Mother 16  . Cancer Mother 75       breast  . Aplastic anemia Daughter        died at age 39  . Cancer Maternal Aunt 40       ovarian  . Cancer Maternal Grandmother 55       ovarian  . Cancer Paternal Aunt 21       ovarian/breast/breast  . Coronary artery disease Sister 50  . Coronary artery disease Brother 60    Review of Systems  Constitutional:  Negative for appetite change, chills, fatigue, fever and unexpected weight change.  HENT:   Negative for hearing loss, lump/mass and trouble swallowing.   Eyes:  Negative for eye problems and icterus.  Respiratory:  Negative for chest tightness, cough and shortness of breath.   Cardiovascular:  Negative for chest pain, leg swelling and palpitations.  Gastrointestinal:  Negative for abdominal distention, abdominal pain, constipation, diarrhea, nausea and vomiting.  Endocrine: Negative for hot flashes.  Genitourinary:  Negative for difficulty urinating.   Musculoskeletal:  Positive for back pain. Negative for arthralgias.  Skin:  Negative for itching and rash.  Neurological:  Negative for dizziness, extremity weakness, headaches and numbness.  Hematological:  Negative for adenopathy. Does not bruise/bleed easily.  Psychiatric/Behavioral:  Negative for depression. The patient is not nervous/anxious.       PHYSICAL EXAMINATION  ECOG PERFORMANCE STATUS: 3 - Symptomatic, >50% confined to bed  Vitals:   01/15/23 1326  BP: (!) 153/85  Pulse: 67  Resp: 19  Temp: 97.7 F (36.5 C)  SpO2: 98%     Physical Exam Constitutional:      General: She is not in acute distress.  Appearance: Normal appearance. She is not toxic-appearing.  HENT:     Head: Normocephalic and atraumatic.  Eyes:     General: No scleral icterus. Cardiovascular:     Rate and Rhythm: Normal rate and regular rhythm.     Pulses: Normal pulses.     Heart sounds: Normal heart sounds.  Pulmonary:     Effort: Pulmonary effort is normal.     Breath sounds: Normal breath sounds.  Abdominal:     General: Abdomen is flat. Bowel sounds are normal. There is no distension.     Palpations: Abdomen is soft.     Tenderness: There is no abdominal tenderness.  Musculoskeletal:        General: No swelling.     Cervical back: Neck supple.  Lymphadenopathy:     Cervical: No cervical adenopathy.  Skin:    General: Skin is warm and dry.     Findings: No rash.  Neurological:     General: No focal deficit present.     Mental Status: She is alert.  Psychiatric:        Mood and Affect: Mood normal.        Behavior: Behavior normal.    LABORATORY DATA:  CBC    Component Value Date/Time   WBC 5.2 01/15/2023 1306   WBC 8.0 05/10/2020 0447   RBC 4.72 01/15/2023 1306   HGB 13.9 01/15/2023 1306   HGB 12.8 08/05/2017 0927   HCT 41.8 01/15/2023 1306   HCT 39.8 08/05/2017 0927   PLT 196 01/15/2023 1306   PLT 267 08/05/2017 0927   MCV 88.6 01/15/2023 1306   MCV 86.7 08/05/2017 0927   MCH 29.4 01/15/2023 1306   MCHC 33.3 01/15/2023 1306   RDW 13.9 01/15/2023 1306   RDW 14.3 08/05/2017 0927   LYMPHSABS 1.6 01/15/2023 1306   LYMPHSABS 2.3 08/05/2017 0927   MONOABS 0.4 01/15/2023 1306   MONOABS 0.4 08/05/2017 0927   EOSABS 0.1 01/15/2023 1306   EOSABS 0.1 08/05/2017 0927   BASOSABS 0.0 01/15/2023 1306   BASOSABS 0.0 08/05/2017 0927    CMP     Component Value Date/Time   NA 140 12/18/2022 1229   NA 141 08/05/2017 0927   K 4.2 12/18/2022 1229   K 4.0 08/05/2017 0927   CL 102 12/18/2022 1229   CL 101  02/07/2013 1125   CO2 32 12/18/2022 1229   CO2 31 (H) 08/05/2017 0927   GLUCOSE 119 (H) 12/18/2022 1229   GLUCOSE 125 08/05/2017 0927   GLUCOSE 193 (H) 02/07/2013 1125   BUN 12 12/18/2022 1229   BUN 22.7 08/05/2017 0927   CREATININE 0.68 12/18/2022 1229   CREATININE 0.8 08/05/2017 0927   CALCIUM 9.6 12/18/2022 1229   CALCIUM 9.7 08/05/2017 0927   PROT 7.6 12/18/2022 1229   PROT 7.3 08/05/2017 0927   ALBUMIN 3.9 12/18/2022 1229   ALBUMIN 3.2 (L) 08/05/2017 0927   AST 44 (H) 12/18/2022 1229   AST 19 08/05/2017 0927   ALT 57 (H) 12/18/2022 1229   ALT 28 08/05/2017 0927   ALKPHOS 79 12/18/2022 1229   ALKPHOS 108 08/05/2017 0927   BILITOT 0.6 12/18/2022 1229   BILITOT 0.34 08/05/2017 0927   GFRNONAA >60 12/18/2022 1229   GFRAA >60 05/10/2020 0447   GFRAA >60 04/18/2020 1023   ASSESSMENT and THERAPY PLAN:   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  1.  Metastatic breast cancer: Most recent scan with pre vascular adenopathy increasing in size suggestive of progression. Since thi appears to be the only area for progression, we discussed that she could consider palliative radiation and continue herceptin maintenance.  She followed up with radiation oncology, she had terrible time with her position, severe back pain when she was getting simulated and she was hoping we can adjust her pain medication to allow for palliative radiation.  I discussed with Nikki our palliative care nurse practitioner to see if they can assist with some pain medication titration during the process and she expressed understanding.  I once again discussed with the patient that this may be the least invasive option and if she cannot tolerate radiation, then we can try switching the medication to Kadcyla.  2.  Chronic back pain: Continue follow-up with palliative care.  3. Low attenuation lesion in  uncinate process of pancreas, possibly new. We discussed about MR abdomen. She refused it, she can't stay still with her back pain. We agreed to follow up on repeat CT imaging in about 3-4 months.   I recommended that she continue on letrozole and receive Herceptin every 4 weeks.    All questions were answered. The patient knows to call the clinic with any problems, questions or concerns. We can certainly see the patient much sooner if necessary.  Total encounter time:30 minutes*in face-to-face visit time, chart review, lab review, care coordination, order entry, and documentation of the encounter time.    *Total Encounter Time as defined by the Centers for Medicare and Medicaid Services includes, in addition to the face-to-face time of a patient visit (documented in the note above) non-face-to-face time: obtaining and reviewing outside history, ordering and reviewing medications, tests or procedures, care coordination (communications with other health care professionals or caregivers) and documentation in the medical record.

## 2023-01-15 NOTE — Progress Notes (Deleted)
patient in building reported that she did not want to see palliative care today, appt canceled

## 2023-01-15 NOTE — Patient Instructions (Signed)
[facility]  Discharge Instructions: Thank you for choosing [facility] to provide your oncology and hematology care.   If you have a lab appointment with the Cancer Center, please go directly to the Cancer Center and check in at the registration area.   Wear comfortable clothing and clothing appropriate for easy access to any Portacath or PICC line.   We strive to give you quality time with your provider. You may need to reschedule your appointment if you arrive late (15 or more minutes).  Arriving late affects you and other patients whose appointments are after yours.  Also, if you miss three or more appointments without notifying the office, you may be dismissed from the clinic at the provider's discretion.      For prescription refill requests, have your pharmacy contact our office and allow 72 hours for refills to be completed.    Today you received the following chemotherapy and/or immunotherapy agents: Kanjinti.      To help prevent nausea and vomiting after your treatment, we encourage you to take your nausea medication as directed.  BELOW ARE SYMPTOMS THAT SHOULD BE REPORTED IMMEDIATELY: *FEVER GREATER THAN 100.4 F (38 C) OR HIGHER *CHILLS OR SWEATING *NAUSEA AND VOMITING THAT IS NOT CONTROLLED WITH YOUR NAUSEA MEDICATION *UNUSUAL SHORTNESS OF BREATH *UNUSUAL BRUISING OR BLEEDING *URINARY PROBLEMS (pain or burning when urinating, or frequent urination) *BOWEL PROBLEMS (unusual diarrhea, constipation, pain near the anus) TENDERNESS IN MOUTH AND THROAT WITH OR WITHOUT PRESENCE OF ULCERS (sore throat, sores in mouth, or a toothache) UNUSUAL RASH, SWELLING OR PAIN  UNUSUAL VAGINAL DISCHARGE OR ITCHING   Items with * indicate a potential emergency and should be followed up as soon as possible or go to the Emergency Department if any problems should occur.  Please show the CHEMOTHERAPY ALERT CARD or IMMUNOTHERAPY ALERT CARD at  check-in to the Emergency Department and triage nurse.  Should you have questions after your visit or need to cancel or reschedule your appointment, please contact [facility]  Dept: [phone]  and follow the prompts.  Office hours are 8:00 a.m. to 4:30 p.m. Monday - Friday. Please note that voicemails left after 4:00 p.m. may not be returned until the following business day.  We are closed weekends and major holidays. You have access to a nurse at all times for urgent questions. Please call the main number to the clinic Dept: [phone] and follow the prompts.   For any non-urgent questions, you may also contact your provider using MyChart. We now offer e-Visits for anyone 18 and older to request care online for non-urgent symptoms. For details visit [email or web address].   Also download the MyChart app! Go to the app store, search "MyChart", open the app, select , and log in with your MyChart username and password.   

## 2023-01-16 ENCOUNTER — Other Ambulatory Visit (HOSPITAL_COMMUNITY): Payer: Self-pay

## 2023-01-20 NOTE — Radiation Completion Notes (Signed)
  Radiation Oncology         [phone] ________________________________  Name: [name] MRN: [id number]  Date of Service: 01/07/2023  DOB: [birth year]  End of Treatment Note:   Diagnosis:    Progressive, Stage IV, HER2 amplified invasive breast cancer involving a prevascular node.   Intent: Curative     ==========DELIVERED PLANS==========  First Treatment Date: 2022-12-29 - Last Treatment Date: 2023-01-07   Plan Name: [id number] Site: Mediastinum Technique: 3D Mode: Photon Dose Per Fraction: 7.5 Gy Prescribed Dose (Delivered / Prescribed): 60 Gy / 60 Gy Prescribed Fxs (Delivered / Prescribed): 8 / 8     ==========ON TREATMENT VISIT DATES========== 2023-01-02   See weekly On Treatment Notes in Epic for details. The patient tolerated radiation. She did have some complaints of nausea during treatment which was not attributed to her treatment field. She remained oxygen dependant as well.   The patient will receive a call in about one month from the radiation oncology department. She will continue follow up with Dr. Al Pimple as well.      Osker Mason, PAC

## 2023-01-21 ENCOUNTER — Telehealth: Payer: Self-pay | Admitting: Nurse Practitioner

## 2023-01-21 ENCOUNTER — Other Ambulatory Visit (HOSPITAL_COMMUNITY): Payer: 59

## 2023-01-21 NOTE — Telephone Encounter (Signed)
Scheduled appointment per scheduling message. Patient is aware of the made appointment. 

## 2023-01-22 ENCOUNTER — Telehealth: Payer: Self-pay | Admitting: Adult Health

## 2023-01-22 NOTE — Telephone Encounter (Signed)
Left patient a vm regarding upcoming appointment  

## 2023-01-23 ENCOUNTER — Telehealth: Payer: Self-pay | Admitting: Hematology and Oncology

## 2023-01-25 DIAGNOSIS — E1149 Type 2 diabetes mellitus with other diabetic neurological complication: Secondary | ICD-10-CM | POA: Diagnosis not present

## 2023-01-27 ENCOUNTER — Encounter: Payer: Self-pay | Admitting: Hematology and Oncology

## 2023-01-28 NOTE — Progress Notes (Signed)
Palliative Medicine [facility]  Telephone:(336) [phone] Fax:(336) [phone]   Name: [name] Date: 01/28/2023 MRN: [id number]  DOB: [birth year]  Patient Care Team: [name], MD as PCP - General (Family Medicine) Laurey Morale, MD as Consulting Physician (Cardiology) Rachel Moulds, MD as Consulting Physician (Hematology and Oncology)   I connected with [name] on 01/28/23 at  2:30 PM EDT by video and verified that I am speaking with the correct person using two identifiers.   I discussed the limitations, risks, security and privacy concerns of performing an evaluation and management service by telemedicine and the availability of in-person appointments. I also discussed with the patient that there may be a patient responsible charge related to this service. The patient expressed understanding and agreed to proceed.   Other persons participating in the visit and their role in the encounter: n/a   Patient's location: home  Provider's location: [facility]   Chief Complaint: f/u of symptom management    INTERVAL HISTORY: [name] is a 72 y.o. female with  medical history including metastatic breast cancer with numerous pulmonary nodules bilaterally s/p chemotherapy currently on herceptin for maintenance. Now with complaints of uncontrolled back pain.  Palliative ask to see for symptom management.   SOCIAL HISTORY:     reports that she quit smoking about 54 years ago. Her smoking use included cigarettes. She has a 15.00 pack-year smoking history. She has been exposed to tobacco smoke. She has never used smokeless tobacco. She reports current drug use. Drug: Marijuana. She reports that she does not drink alcohol.  ADVANCE DIRECTIVES:    CODE STATUS:   PAST MEDICAL HISTORY: Past Medical History:  Diagnosis Date   Arthritis    Back pain    Breast cancer (HCC) dx'd 11/2012   left   Chest pain    COPD (chronic obstructive pulmonary disease)  (HCC)    Diabetes mellitus without complication (HCC) 09/28/2014   Ear pain    Fatty liver 6/03   Hypertension    Lung disease    Lung metastases dx'd 11/2012   Obesity, unspecified    Other abnormal glucose    Suicide attempt (HCC) 1996   Syncope and collapse    Unspecified sleep apnea     ALLERGIES:  is allergic to meperidine hcl, penicillins, and aspirin.  MEDICATIONS:  Current Outpatient Medications  Medication Sig Dispense Refill   albuterol (VENTOLIN HFA) 108 (90 Base) MCG/ACT inhaler INHALE 2 PUFFS INTO THE LUNGS EVERY 6 HOURS AS NEEDED FOR WHEEZE (Patient not taking: Reported on 10/21/2022) 8.5 each 1   Blood Glucose Monitoring Suppl (ONE TOUCH ULTRA 2) w/Device KIT 1 kit by Does not apply route QID. ICD 10-code: E11.49. 1 kit 0   insulin glargine (LANTUS SOLOSTAR) 100 UNIT/ML Solostar Pen Inject 18 Units into the skin daily.     insulin lispro (HUMALOG KWIKPEN) 100 UNIT/ML KwikPen Inject 6-10 Units into the skin 3 (three) times daily. Inject 10 u subq three times a day with meals     Insulin Pen Needle (PEN NEEDLES) 30G X 8 MM MISC Please use to inject insulin 4 times daily. 200 each 3   letrozole (FEMARA) 2.5 MG tablet Take 1 tablet (2.5 mg total) by mouth daily. 90 tablet 12   loperamide (IMODIUM) 2 MG capsule TAKE 1 CAPSULE (2 MG TOTAL) BY MOUTH AS NEEDED FOR DIARRHEA OR LOOSE STOOLS. (Patient not taking: Reported on 11/04/2022) 30 capsule 1   ondansetron (  ZOFRAN) 8 MG tablet Take 1 tablet (8 mg total) by mouth every 8 (eight) hours as needed. 20 tablet 2   ONETOUCH ULTRA test strip USE AS INSTRUCTED TO CHECK ONCE DAILY (Patient not taking: Reported on 10/21/2022) 100 strip 12   oxyCODONE 10 MG TABS Take 1 tablet (10 mg total) by mouth every 6 (six) hours as needed for severe pain. 60 tablet 0   OXYGEN Inhale into the lungs.     prochlorperazine (COMPAZINE) 10 MG tablet Take 1 tablet (10 mg total) by mouth every 8 (eight) hours as needed for nausea or vomiting. 30 tablet 1    Semaglutide, 2 MG/DOSE, (OZEMPIC, 2 MG/DOSE,) 8 MG/3ML SOPN Inject 2 mg into the skin once a week. 3 mL 6   No current facility-administered medications for this visit.    VITAL SIGNS: There were no vitals taken for this visit. There were no vitals filed for this visit.  Estimated body mass index is 51.86 kg/m as calculated from the following:   Height as of 01/15/23: 5\' 4"  (1.626 m).   Weight as of 01/15/23: 302 lb 2 oz (137 kg).   PERFORMANCE STATUS (ECOG) : 1 - Symptomatic but completely ambulatory  Assessment NAD, in wheelchair Normal breathing pattern with home oxygen RRR AAO x 4   IMPRESSION:  I connected with [name] by video. No acute distress. States overall she is doing ok. Occasional nausea which is controlled. Is complaining of insomnia and anxiousness. This resolved however since radiation and health changes feels it has heightened again.   Neoplasm/Chronic/Neuropathic pain [name] reports pain is ongoing. Some days are better than others. She unfortunately has run out of her pain medication. Pain is constant in her back and lower extremities.   Will send in refill for her Oxy IR.  We have attempted long-acting medications and Lyrica/Gabapentin in the past however she was not able to tolerate due to nausea and vomiting. We discussed this again today as she is interested in retrying pregablin. On medication review she was on gabapentin from 2017-2021 and then transitioned to pregablin. We discussed plans to restart in the upcoming weeks if no improvement and she is in agreement.   We will continue to closely monitor and adjust medications accordingly.   Constipation She is challenged with constipation in the setting of diarrhea related to therapies. States she will take Imodium for loose stools which as a result causes her to become constipated. We discussed finding a balance and dietary habits.   I discussed the importance of continued conversation with family and  their medical providers regarding overall plan of care and treatment options, ensuring decisions are within the context of the patients values and GOCs.  PLAN: Oxy IR 10 mg as needed Valium 5mg  as needed   Miralax daily for bowel regimen Ongoing goals of care discussions and support I will plan to see her back in the office in 3-4 weeks in collaboration with her other oncology appointments. Sooner if needed.    Patient expressed understanding and was in agreement with this plan. She also understands that She can call the clinic at any time with any questions, concerns, or complaints.    Any controlled substances utilized were prescribed in the context of palliative care. PDMP has been reviewed.    Visit consisted of counseling and education dealing with the complex and emotionally intense issues of symptom management and palliative care in the setting of serious and potentially Moss-threatening illness.Greater than 50%  of  this time was spent counseling and coordinating care related to the above assessment and plan.  [name], AGPCNP-BC  Palliative Medicine Team/[facility]  *Please note that this is a verbal dictation therefore any spelling or grammatical errors are due to the "Dragon Medical One" system interpretation.

## 2023-01-29 ENCOUNTER — Inpatient Hospital Stay: Payer: 59 | Attending: Oncology | Admitting: Nurse Practitioner

## 2023-01-29 DIAGNOSIS — G893 Neoplasm related pain (acute) (chronic): Secondary | ICD-10-CM

## 2023-01-29 DIAGNOSIS — G4709 Other insomnia: Secondary | ICD-10-CM | POA: Diagnosis not present

## 2023-01-29 DIAGNOSIS — C50919 Malignant neoplasm of unspecified site of unspecified female breast: Secondary | ICD-10-CM

## 2023-01-29 DIAGNOSIS — F419 Anxiety disorder, unspecified: Secondary | ICD-10-CM

## 2023-01-29 DIAGNOSIS — Z515 Encounter for palliative care: Secondary | ICD-10-CM

## 2023-01-29 DIAGNOSIS — C7800 Secondary malignant neoplasm of unspecified lung: Secondary | ICD-10-CM

## 2023-01-29 MED ORDER — OXYCODONE HCL 10 MG PO TABS
10.0000 mg | ORAL_TABLET | Freq: Four times a day (QID) | ORAL | 0 refills | Status: DC | PRN
Start: 2023-01-29 — End: 2023-03-09

## 2023-01-29 MED ORDER — DIAZEPAM 5 MG PO TABS
5.0000 mg | ORAL_TABLET | Freq: Two times a day (BID) | ORAL | 0 refills | Status: DC | PRN
Start: 2023-01-29 — End: 2023-05-04

## 2023-02-07 ENCOUNTER — Other Ambulatory Visit (HOSPITAL_COMMUNITY): Payer: Self-pay

## 2023-02-08 DIAGNOSIS — M25569 Pain in unspecified knee: Secondary | ICD-10-CM | POA: Diagnosis not present

## 2023-02-08 DIAGNOSIS — U071 COVID-19: Secondary | ICD-10-CM | POA: Diagnosis not present

## 2023-02-08 DIAGNOSIS — I5020 Unspecified systolic (congestive) heart failure: Secondary | ICD-10-CM | POA: Diagnosis not present

## 2023-02-08 DIAGNOSIS — R062 Wheezing: Secondary | ICD-10-CM | POA: Diagnosis not present

## 2023-02-09 DIAGNOSIS — R062 Wheezing: Secondary | ICD-10-CM | POA: Diagnosis not present

## 2023-02-09 DIAGNOSIS — I5020 Unspecified systolic (congestive) heart failure: Secondary | ICD-10-CM | POA: Diagnosis not present

## 2023-02-09 DIAGNOSIS — U071 COVID-19: Secondary | ICD-10-CM | POA: Diagnosis not present

## 2023-02-09 DIAGNOSIS — M25569 Pain in unspecified knee: Secondary | ICD-10-CM | POA: Diagnosis not present

## 2023-02-12 ENCOUNTER — Ambulatory Visit: Payer: 59 | Admitting: Hematology and Oncology

## 2023-02-12 ENCOUNTER — Ambulatory Visit: Payer: 59

## 2023-02-16 ENCOUNTER — Inpatient Hospital Stay (HOSPITAL_BASED_OUTPATIENT_CLINIC_OR_DEPARTMENT_OTHER): Payer: 59 | Admitting: Adult Health

## 2023-02-16 ENCOUNTER — Inpatient Hospital Stay: Payer: 59 | Attending: Oncology

## 2023-02-16 ENCOUNTER — Encounter: Payer: Self-pay | Admitting: Adult Health

## 2023-02-16 ENCOUNTER — Other Ambulatory Visit: Payer: Self-pay

## 2023-02-16 VITALS — BP 158/60 | HR 65 | Temp 97.9°F | Resp 18

## 2023-02-16 DIAGNOSIS — C50212 Malignant neoplasm of upper-inner quadrant of left female breast: Secondary | ICD-10-CM | POA: Diagnosis not present

## 2023-02-16 DIAGNOSIS — Z5112 Encounter for antineoplastic immunotherapy: Secondary | ICD-10-CM | POA: Diagnosis not present

## 2023-02-16 DIAGNOSIS — C7800 Secondary malignant neoplasm of unspecified lung: Secondary | ICD-10-CM

## 2023-02-16 DIAGNOSIS — G8929 Other chronic pain: Secondary | ICD-10-CM | POA: Diagnosis not present

## 2023-02-16 DIAGNOSIS — Z803 Family history of malignant neoplasm of breast: Secondary | ICD-10-CM | POA: Diagnosis not present

## 2023-02-16 DIAGNOSIS — Z87891 Personal history of nicotine dependence: Secondary | ICD-10-CM | POA: Diagnosis not present

## 2023-02-16 DIAGNOSIS — Z79811 Long term (current) use of aromatase inhibitors: Secondary | ICD-10-CM

## 2023-02-16 DIAGNOSIS — M549 Dorsalgia, unspecified: Secondary | ICD-10-CM | POA: Insufficient documentation

## 2023-02-16 DIAGNOSIS — Z86718 Personal history of other venous thrombosis and embolism: Secondary | ICD-10-CM | POA: Diagnosis not present

## 2023-02-16 DIAGNOSIS — Z8041 Family history of malignant neoplasm of ovary: Secondary | ICD-10-CM

## 2023-02-16 DIAGNOSIS — E1149 Type 2 diabetes mellitus with other diabetic neurological complication: Secondary | ICD-10-CM

## 2023-02-16 DIAGNOSIS — Z170 Estrogen receptor positive status [ER+]: Secondary | ICD-10-CM

## 2023-02-16 DIAGNOSIS — C50919 Malignant neoplasm of unspecified site of unspecified female breast: Secondary | ICD-10-CM

## 2023-02-16 MED ORDER — DIPHENHYDRAMINE HCL 25 MG PO CAPS
50.0000 mg | ORAL_CAPSULE | Freq: Once | ORAL | Status: AC
  Administered 2023-02-16: 50 mg via ORAL
  Filled 2023-02-16: qty 2

## 2023-02-16 MED ORDER — TRASTUZUMAB-ANNS CHEMO 150 MG IV SOLR
750.0000 mg | Freq: Once | INTRAVENOUS | Status: AC
  Administered 2023-02-16: 750 mg via INTRAVENOUS
  Filled 2023-02-16: qty 35.71

## 2023-02-16 MED ORDER — ACETAMINOPHEN 325 MG PO TABS
650.0000 mg | ORAL_TABLET | Freq: Once | ORAL | Status: AC
  Administered 2023-02-16: 650 mg via ORAL
  Filled 2023-02-16: qty 2

## 2023-02-16 MED ORDER — SODIUM CHLORIDE 0.9% FLUSH
10.0000 mL | INTRAVENOUS | Status: DC | PRN
  Administered 2023-02-16: 10 mL

## 2023-02-16 MED ORDER — LORAZEPAM 2 MG/ML IJ SOLN
0.5000 mg | Freq: Once | INTRAMUSCULAR | Status: DC
  Filled 2023-02-16: qty 1

## 2023-02-16 MED ORDER — HEPARIN SOD (PORK) LOCK FLUSH 100 UNIT/ML IV SOLN
500.0000 [IU] | Freq: Once | INTRAVENOUS | Status: AC | PRN
  Administered 2023-02-16: 500 [IU]

## 2023-02-16 MED ORDER — SODIUM CHLORIDE 0.9 % IV SOLN: Freq: Once | INTRAVENOUS | Status: AC

## 2023-02-16 MED ORDER — INSULIN LISPRO (1 UNIT DIAL) 100 UNIT/ML (KWIKPEN)
6.0000 [IU] | PEN_INJECTOR | Freq: Three times a day (TID) | SUBCUTANEOUS | 3 refills | Status: DC
Start: 2023-02-16 — End: 2023-11-15

## 2023-02-16 NOTE — Assessment & Plan Note (Signed)
[name] is a 72 year old woman with metastatic breast cancer to the lung noted in April 2014 (see oncologic history above).  1.  Metastatic breast cancer: She will proceed with Herceptin today which she continues to tolerate well.  Her most recent echocardiogram in May was normal and will be repeated in November.  She will also continue on letrozole daily.  She is due for repeat imaging with CT chest abdomen pelvis at the end of this month.  I placed orders for this today.  2.  Chronic back pain: She sees palliative care regularly.  She is taking oxycodone as needed.  She is not taking the Valium at night.  She will see Lowella Bandy again in 4 weeks.  [name] will return in 4 weeks for follow-up and her next treatment.  She knows to call for any questions or concerns that may arise between now and her next visit.

## 2023-02-16 NOTE — Progress Notes (Signed)
[facility] Cancer Follow up:    [name], MD [address] [place] [address]   DIAGNOSIS:  Cancer Staging  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 [facility]) Staging form: Breast, AJCC 7th Edition - Clinical: Stage IV (TX, NX, M1) - Signed by Lowella Dell, MD on 04/17/2015  Malignant neoplasm of upper-inner quadrant of left breast in female, estrogen receptor positive (HCC) Staging form: Breast, AJCC 7th Edition - Clinical: Stage IA (T1c, N0, cM0) - Unsigned Specimen type: Core Needle Biopsy Histopathologic type: 9931 Laterality: Left Staging comments: Staged at breast conference 4.2.14  - Pathologic: Stage IV (M1) - Unsigned Specimen type: Core Needle Biopsy Histopathologic type: 9931 Laterality: Left   SUMMARY OF ONCOLOGIC HISTORY: [name] with stage IV breast cancer initially diagnosed March 2014    (1) s/p left breast upper inner quadrant biopsy 11/05/2012 for a clinical T1c NX M1, stage IV invasive ductal carcinoma, grade 3, estrogen and progesterone receptor negative, with an MIB-1 of 77%, and HER-2 amplified by CISH with a ratio of 4.39.             (a) mammography 04/16/2016 shows the left breast mass to have nearly completely resolved   (2) chest, abdomen and pelvis CT scans and PET scan April 2014 showed multiple bilateral pulmonaru nodules but no liver or bone involvement; biopsy of a pulmonary nodule on 11/30/2012 confirmed metastatic breast cancer.              (a) CT in GI obtained 09/28/2014 shows no measurable disease in the lungs             (b) chest CT 12/20/2015 showed stable small right lung pulmonary nodules and an area in the right lower lobe pleural parenchymal thickness requiring attention in future studies              (3) received docetaxel / trastuzumab/ pertuzumab x4, completed 02/07/2013, with a good response,    (4) trastuzumab/ pertuzumab continued every 21 days;             (a) Pertuzumab  discontinued after 07/28/2016, and Trastuzumab given every 4 weeks starting 08/2016             (b) most recent echocardiogram 06/28/2018 shows EF of 60-65% (these will be every 6 months)   (5) anastrozole started 02/15/2013, discontinued October 2014 with poor tolerance   (6) 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     (8)  if and when we documented disease progression we will change the letrozole to fulvestrant and Palbociclib.   CURRENT THERAPY: Letrozole, Herceptin  INTERVAL HISTORY: [name] 72 y.o. female returns for her history of metastatic breast cancer.  She continues on letrozole daily and Herceptin every 4 weeks which she tolerates well.  Her most recent echocardiogram occurred on 01/15/2023 which showed a normal EF of 60-65%.  She undergoes echocardiogram every 6 months.    Her most recent restaging scans occurred on November 12, 2022 demonstrating prevascular adenopathy increasing in size which was suggestive of progression.  This was treated with radiation from May 13 through May 22.  She also had a low-attenuation lesion in her pancreas.  Dr. Al Pimple had recommended further evaluation with MRI however Breleigh declined therefore she should repeat CT at  the end of this month.  She continues to see our palliative NP for her pain control  She is prescribed Oxycodone 10mg  PO PRN, Valium 5mg  po PRN.  She is also on a bowel regimen.  She continues to struggle with her sleep and pain.  She said that radiation was such a challenging ordeal and she hopes she never has to do it again.   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  MEDICAL HISTORY: Past Medical History:  Diagnosis Date   Arthritis    Back pain    Breast cancer (HCC) dx'd 11/2012   left   Chest pain    COPD (chronic obstructive pulmonary disease) (HCC)    Diabetes mellitus without complication (HCC) 09/28/2014   Ear pain    Fatty liver 6/03   Hypertension    Lung disease    Lung metastases dx'd 11/2012   Obesity, unspecified    Other abnormal glucose    Suicide attempt (HCC) 1996   Syncope and collapse    Unspecified sleep apnea     SURGICAL HISTORY: Past Surgical History:  Procedure Laterality Date   CARDIAC CATHETERIZATION     2007   CHOLECYSTECTOMY     TUBAL LIGATION      SOCIAL HISTORY: Social History   Socioeconomic History   Marital status: Divorced    Spouse name: Not on file   Number of children: 4   Years of education: 12   Highest education level: 12th grade  Occupational History   Occupation: Disability   Occupation: retired-daycare  Tobacco Use   Smoking status: Former    Packs/day: 3.00    Years: 5.00    Additional pack years: 0.00    Total pack years: 15.00    Types: Cigarettes    Quit date: 08/18/1968    Years since quitting: 54.5    Passive exposure: Past   Smokeless tobacco: Never  Vaping Use   Vaping Use: Never used  Substance and Sexual Activity   Alcohol use: No    Alcohol/week: 0.0 standard drinks of alcohol   Drug use: Yes    Types: Marijuana    Comment: Medical Marijuana   Sexual activity: Not Currently    Partners: Male   Other Topics Concern   Not on file  Social History Narrative   Health Care POA:    Emergency Contact: Amador Cunas [phone]   End of Life Plan:    Who lives with you: two grandchildren, friend, daughter   Any pets: 3 poodles   Diet: Pt has a variety of protein, starch, vegetables.  Pt is currently working on cutting back on portions for weight loss.   Exercise: Pt has not regular exercise routine.  Occasionally walks around home.   Seatbelts: Pt reports wearing seatbelt occasionally.   Sun Exposure/Protection: Pt does not use sun protection   Hobbies: reading, playing on kindle, ebay  Currently in her home she keeps her granddaughter [name] Trach, [place], who is the daughter of the patient's daughter Para March (the patient refers to [name] as "my adopted daughter"); grandson Vanbibber "[name]" Maguire, [place], who is [name]'s half-brother; daughter [name], and an [place] friend, [name], the patient's significant other. Daughter [name] is a Public librarian, currently unemployed. Son [name] works as an Personnel officer in [place]. Daughter Para March is currently in prison due to killing someone in a car accident. Daughter [name] died from aplastic anemia at the age of 28. The patient has a total of 4 grandchildren. She is not a church attender   Social Determinants of Health   Financial Resource Strain: Medium Risk (07/30/2022)   Overall Financial Resource Strain (CARDIA)    Difficulty of Paying Living Expenses: Somewhat hard  Food Insecurity: No Food Insecurity (07/30/2022)   Hunger Vital Sign    Worried About Running Out of Food in the Last Year: Never true    Ran Out of Food in the Last Year: Never true  Transportation Needs: No Transportation Needs (07/30/2022)   PRAPARE - Administrator, Civil Service (Medical): No    Lack of Transportation (Non-Medical): No  Physical Activity: Inactive (07/30/2022)   Exercise Vital Sign    Days of  Exercise per Week: 0 days    Minutes of Exercise per Session: 0 min  Stress: No Stress Concern Present (07/30/2022)   [employer] of Occupational Health - Occupational Stress Questionnaire    Feeling of Stress : Only a little  Social Connections: Moderately Integrated (07/30/2022)   Social Connection and Isolation Panel [NHANES]    Frequency of Communication with Friends and Family: More than three times a week    Frequency of Social Gatherings with Friends and Family: More than three times a week    Attends Religious Services: More than 4 times per year    Active Member of [employer] or Organizations: Yes    Attends Engineer, structural: More than 4 times per year    Marital Status: Divorced  Intimate Partner Violence: Not At Risk (07/30/2022)   Humiliation, Afraid, Rape, and Kick questionnaire    Fear of Current or Ex-Partner: No    Emotionally Abused: No    Physically Abused: No    Sexually Abused: No    FAMILY HISTORY: Family History  Problem Relation Age of Onset   Coronary artery disease Father 62   Diabetes Father    Heart disease Father    Breast cancer Mother 84   Cancer Mother 73       breast   Aplastic anemia Daughter        died at age 1   Cancer Maternal Aunt 87       ovarian   Cancer Maternal Grandmother 2       ovarian   Cancer Paternal Aunt 49       ovarian/breast/breast   Coronary artery disease Sister 58   Coronary artery disease Brother 105    Review of Systems  Constitutional:  Positive for fatigue. Negative for appetite change, chills, fever and unexpected weight change.  HENT:   Negative for hearing loss, lump/mass and trouble swallowing.   Eyes:  Negative for eye problems and icterus.  Respiratory:  Negative for chest tightness, cough and shortness of breath.   Cardiovascular:  Negative for chest pain, leg swelling and palpitations.  Gastrointestinal:  Negative for abdominal distention, abdominal pain, constipation, diarrhea, nausea  and vomiting.  Endocrine: Negative for hot flashes.  Genitourinary:  Negative for difficulty urinating.   Musculoskeletal:  Negative for arthralgias.  Skin:  Negative for itching and rash.  Neurological:  Negative for dizziness, extremity weakness, headaches and numbness.  Hematological:  Negative for adenopathy. Does not bruise/bleed easily.  Psychiatric/Behavioral:  Positive for sleep disturbance. Negative for depression. The patient is not nervous/anxious.       PHYSICAL EXAMINATION   Onc Performance Status - 02/16/23 1412       ECOG Perf Status   ECOG Perf Status Capable of only limited selfcare, confined to bed or chair more than 50% of waking hours      KPS SCALE   KPS % SCORE Cares for self, unable to carry on normal activity or to do active work             Vitals:   02/16/23 1403  BP: (!) 170/75  Pulse: 72  Resp: 18  Temp: (!) 97.3 F (36.3 C)  SpO2: 95%    Physical Exam Constitutional:      General: She is not in acute distress.    Appearance: Normal appearance. She is ill-appearing (chronically ill, examined in wheelchair). She is not toxic-appearing.  HENT:     Head: Normocephalic and atraumatic.     Mouth/Throat:     Mouth: Mucous membranes are moist.     Pharynx: Oropharynx is clear. No oropharyngeal exudate or posterior oropharyngeal erythema.  Eyes:     General: No scleral icterus. Cardiovascular:     Rate and Rhythm: Normal rate and regular rhythm.     Pulses: Normal pulses.     Heart sounds: Normal heart sounds.  Pulmonary:     Effort: Pulmonary effort is normal.     Breath sounds: Normal breath sounds.  Abdominal:     General: Abdomen is flat. Bowel sounds are normal. There is no distension.     Palpations: Abdomen is soft.     Tenderness: There is no abdominal tenderness.  Musculoskeletal:        General: No swelling.     Cervical back: Neck supple.  Lymphadenopathy:     Cervical: No cervical adenopathy.  Skin:    General: Skin is  warm and dry.     Findings: No rash.  Neurological:     General: No focal deficit present.     Mental Status: She is alert.  Psychiatric:        Mood and Affect: Mood normal.        Behavior: Behavior normal.        ASSESSMENT and THERAPY PLAN:   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 [facility]) Antina is a 72 year old woman with metastatic breast cancer to the lung noted in April 2014 (see oncologic history above).  1.  Metastatic breast cancer: She will proceed with Herceptin today which she continues to tolerate well.  Her most recent echocardiogram in May was normal and will be repeated in November.  She will also continue on letrozole daily.  She is due for repeat imaging with CT chest abdomen pelvis at the end of this month.  I placed orders for this today.  2.  Chronic back pain: She sees palliative care regularly.  She is taking oxycodone as needed.  She is not taking the Valium at night.  She will see Lowella Bandy again in 4 weeks.  Iremide will return in 4 weeks for follow-up and her next treatment.  She knows to call for any questions or concerns that  may arise between now and her next visit.   All questions were answered. The patient knows to call the clinic with any problems, questions or concerns. We can certainly see the patient much sooner if necessary.  Total encounter time:20 minutes*in face-to-face visit time, chart review, lab review, care coordination, order entry, and documentation of the encounter time.    Lillard Anes, NP 02/16/23 2:31 PM Medical Oncology and Hematology [facility] [address] [place], [place] [address] Tel. [phone]    Fax. [phone]  *Total Encounter Time as defined by the Centers for Medicare and Medicaid Services includes, in addition to the face-to-face time of a patient visit (documented in the note above) non-face-to-face time: obtaining and reviewing outside history, ordering and reviewing medications, tests or procedures,  care coordination (communications with other health care professionals or caregivers) and documentation in the medical record.

## 2023-02-16 NOTE — Patient Instructions (Signed)
[facility]  Discharge Instructions: Thank you for choosing [facility] to provide your oncology and hematology care.   If you have a lab appointment with the Cancer Center, please go directly to the Cancer Center and check in at the registration area.   Wear comfortable clothing and clothing appropriate for easy access to any Portacath or PICC line.   We strive to give you quality time with your provider. You may need to reschedule your appointment if you arrive late (15 or more minutes).  Arriving late affects you and other patients whose appointments are after yours.  Also, if you miss three or more appointments without notifying the office, you may be dismissed from the clinic at the provider's discretion.      For prescription refill requests, have your pharmacy contact our office and allow 72 hours for refills to be completed.    Today you received the following chemotherapy and/or immunotherapy agents: Kanjinti.      To help prevent nausea and vomiting after your treatment, we encourage you to take your nausea medication as directed.  BELOW ARE SYMPTOMS THAT SHOULD BE REPORTED IMMEDIATELY: *FEVER GREATER THAN 100.4 F (38 C) OR HIGHER *CHILLS OR SWEATING *NAUSEA AND VOMITING THAT IS NOT CONTROLLED WITH YOUR NAUSEA MEDICATION *UNUSUAL SHORTNESS OF BREATH *UNUSUAL BRUISING OR BLEEDING *URINARY PROBLEMS (pain or burning when urinating, or frequent urination) *BOWEL PROBLEMS (unusual diarrhea, constipation, pain near the anus) TENDERNESS IN MOUTH AND THROAT WITH OR WITHOUT PRESENCE OF ULCERS (sore throat, sores in mouth, or a toothache) UNUSUAL RASH, SWELLING OR PAIN  UNUSUAL VAGINAL DISCHARGE OR ITCHING   Items with * indicate a potential emergency and should be followed up as soon as possible or go to the Emergency Department if any problems should occur.  Please show the CHEMOTHERAPY ALERT CARD or IMMUNOTHERAPY ALERT CARD at  check-in to the Emergency Department and triage nurse.  Should you have questions after your visit or need to cancel or reschedule your appointment, please contact [facility]  Dept: [phone]  and follow the prompts.  Office hours are 8:00 a.m. to 4:30 p.m. Monday - Friday. Please note that voicemails left after 4:00 p.m. may not be returned until the following business day.  We are closed weekends and major holidays. You have access to a nurse at all times for urgent questions. Please call the main number to the clinic Dept: [phone] and follow the prompts.   For any non-urgent questions, you may also contact your provider using MyChart. We now offer e-Visits for anyone 18 and older to request care online for non-urgent symptoms. For details visit [email or web address].   Also download the MyChart app! Go to the app store, search "MyChart", open the app, select [facility], and log in with your MyChart username and password.   

## 2023-02-24 DIAGNOSIS — E1149 Type 2 diabetes mellitus with other diabetic neurological complication: Secondary | ICD-10-CM | POA: Diagnosis not present

## 2023-03-09 ENCOUNTER — Other Ambulatory Visit: Payer: Self-pay

## 2023-03-09 ENCOUNTER — Other Ambulatory Visit: Payer: Self-pay | Admitting: Nurse Practitioner

## 2023-03-09 DIAGNOSIS — G893 Neoplasm related pain (acute) (chronic): Secondary | ICD-10-CM

## 2023-03-09 DIAGNOSIS — C50919 Malignant neoplasm of unspecified site of unspecified female breast: Secondary | ICD-10-CM

## 2023-03-09 DIAGNOSIS — Z515 Encounter for palliative care: Secondary | ICD-10-CM

## 2023-03-09 MED ORDER — OXYCODONE HCL 10 MG PO TABS
10.0000 mg | ORAL_TABLET | Freq: Four times a day (QID) | ORAL | 0 refills | Status: AC | PRN
Start: 2023-03-09 — End: ?

## 2023-03-10 DIAGNOSIS — M25569 Pain in unspecified knee: Secondary | ICD-10-CM | POA: Diagnosis not present

## 2023-03-10 DIAGNOSIS — R062 Wheezing: Secondary | ICD-10-CM | POA: Diagnosis not present

## 2023-03-10 DIAGNOSIS — U071 COVID-19: Secondary | ICD-10-CM | POA: Diagnosis not present

## 2023-03-10 DIAGNOSIS — I5020 Unspecified systolic (congestive) heart failure: Secondary | ICD-10-CM | POA: Diagnosis not present

## 2023-03-11 DIAGNOSIS — R062 Wheezing: Secondary | ICD-10-CM | POA: Diagnosis not present

## 2023-03-11 DIAGNOSIS — I5020 Unspecified systolic (congestive) heart failure: Secondary | ICD-10-CM | POA: Diagnosis not present

## 2023-03-11 DIAGNOSIS — U071 COVID-19: Secondary | ICD-10-CM | POA: Diagnosis not present

## 2023-03-11 DIAGNOSIS — M25569 Pain in unspecified knee: Secondary | ICD-10-CM | POA: Diagnosis not present

## 2023-03-12 ENCOUNTER — Ambulatory Visit: Payer: 59 | Admitting: Adult Health

## 2023-03-12 ENCOUNTER — Ambulatory Visit: Payer: 59

## 2023-03-16 ENCOUNTER — Other Ambulatory Visit: Payer: Self-pay

## 2023-03-16 ENCOUNTER — Inpatient Hospital Stay (HOSPITAL_BASED_OUTPATIENT_CLINIC_OR_DEPARTMENT_OTHER): Payer: 59 | Admitting: Nurse Practitioner

## 2023-03-16 ENCOUNTER — Inpatient Hospital Stay (HOSPITAL_BASED_OUTPATIENT_CLINIC_OR_DEPARTMENT_OTHER): Payer: 59 | Admitting: Adult Health

## 2023-03-16 ENCOUNTER — Encounter: Payer: Self-pay | Admitting: Nurse Practitioner

## 2023-03-16 ENCOUNTER — Inpatient Hospital Stay: Payer: 59

## 2023-03-16 ENCOUNTER — Encounter: Payer: Self-pay | Admitting: Adult Health

## 2023-03-16 VITALS — BP 127/71 | HR 65 | Temp 97.6°F | Resp 16

## 2023-03-16 DIAGNOSIS — C7800 Secondary malignant neoplasm of unspecified lung: Secondary | ICD-10-CM

## 2023-03-16 DIAGNOSIS — M792 Neuralgia and neuritis, unspecified: Secondary | ICD-10-CM | POA: Diagnosis not present

## 2023-03-16 DIAGNOSIS — Z170 Estrogen receptor positive status [ER+]: Secondary | ICD-10-CM

## 2023-03-16 DIAGNOSIS — M549 Dorsalgia, unspecified: Secondary | ICD-10-CM | POA: Diagnosis not present

## 2023-03-16 DIAGNOSIS — Z5112 Encounter for antineoplastic immunotherapy: Secondary | ICD-10-CM | POA: Diagnosis not present

## 2023-03-16 DIAGNOSIS — C50919 Malignant neoplasm of unspecified site of unspecified female breast: Secondary | ICD-10-CM

## 2023-03-16 DIAGNOSIS — G893 Neoplasm related pain (acute) (chronic): Secondary | ICD-10-CM

## 2023-03-16 DIAGNOSIS — C50212 Malignant neoplasm of upper-inner quadrant of left female breast: Secondary | ICD-10-CM | POA: Diagnosis not present

## 2023-03-16 DIAGNOSIS — Z515 Encounter for palliative care: Secondary | ICD-10-CM

## 2023-03-16 DIAGNOSIS — Z79811 Long term (current) use of aromatase inhibitors: Secondary | ICD-10-CM | POA: Diagnosis not present

## 2023-03-16 DIAGNOSIS — G8929 Other chronic pain: Secondary | ICD-10-CM | POA: Diagnosis not present

## 2023-03-16 DIAGNOSIS — Z87891 Personal history of nicotine dependence: Secondary | ICD-10-CM | POA: Diagnosis not present

## 2023-03-16 DIAGNOSIS — Z86718 Personal history of other venous thrombosis and embolism: Secondary | ICD-10-CM | POA: Diagnosis not present

## 2023-03-16 MED ORDER — TRASTUZUMAB-ANNS CHEMO 150 MG IV SOLR
750.0000 mg | Freq: Once | INTRAVENOUS | Status: AC
  Administered 2023-03-16: 750 mg via INTRAVENOUS
  Filled 2023-03-16: qty 35.71

## 2023-03-16 MED ORDER — HEPARIN SOD (PORK) LOCK FLUSH 100 UNIT/ML IV SOLN
500.0000 [IU] | Freq: Once | INTRAVENOUS | Status: AC | PRN
  Administered 2023-03-16: 500 [IU]

## 2023-03-16 MED ORDER — DIPHENHYDRAMINE HCL 25 MG PO CAPS
50.0000 mg | ORAL_CAPSULE | Freq: Once | ORAL | Status: AC
  Administered 2023-03-16: 50 mg via ORAL
  Filled 2023-03-16: qty 2

## 2023-03-16 MED ORDER — SODIUM CHLORIDE 0.9 % IV SOLN: Freq: Once | INTRAVENOUS | Status: AC

## 2023-03-16 MED ORDER — PREGABALIN 75 MG PO CAPS: 75.0000 mg | ORAL_CAPSULE | Freq: Two times a day (BID) | ORAL | 0 refills | Status: DC

## 2023-03-16 MED ORDER — LORAZEPAM 2 MG/ML IJ SOLN
0.5000 mg | Freq: Once | INTRAMUSCULAR | Status: AC
  Administered 2023-03-16: 0.5 mg via INTRAVENOUS
  Filled 2023-03-16: qty 1

## 2023-03-16 MED ORDER — SODIUM CHLORIDE 0.9% FLUSH
10.0000 mL | INTRAVENOUS | Status: DC | PRN
  Administered 2023-03-16: 10 mL

## 2023-03-16 MED ORDER — ACETAMINOPHEN 325 MG PO TABS
650.0000 mg | ORAL_TABLET | Freq: Once | ORAL | Status: AC
  Administered 2023-03-16: 650 mg via ORAL
  Filled 2023-03-16: qty 2

## 2023-03-16 NOTE — Patient Instructions (Signed)
[facility]  Discharge Instructions: Thank you for choosing [facility] to provide your oncology and hematology care.   If you have a lab appointment with the Cancer Center, please go directly to the Cancer Center and check in at the registration area.   Wear comfortable clothing and clothing appropriate for easy access to any Portacath or PICC line.   We strive to give you quality time with your provider. You may need to reschedule your appointment if you arrive late (15 or more minutes).  Arriving late affects you and other patients whose appointments are after yours.  Also, if you miss three or more appointments without notifying the office, you may be dismissed from the clinic at the provider's discretion.      For prescription refill requests, have your pharmacy contact our office and allow 72 hours for refills to be completed.    Today you received the following chemotherapy and/or immunotherapy agents: Kanjinti.      To help prevent nausea and vomiting after your treatment, we encourage you to take your nausea medication as directed.  BELOW ARE SYMPTOMS THAT SHOULD BE REPORTED IMMEDIATELY: *FEVER GREATER THAN 100.4 F (38 C) OR HIGHER *CHILLS OR SWEATING *NAUSEA AND VOMITING THAT IS NOT CONTROLLED WITH YOUR NAUSEA MEDICATION *UNUSUAL SHORTNESS OF BREATH *UNUSUAL BRUISING OR BLEEDING *URINARY PROBLEMS (pain or burning when urinating, or frequent urination) *BOWEL PROBLEMS (unusual diarrhea, constipation, pain near the anus) TENDERNESS IN MOUTH AND THROAT WITH OR WITHOUT PRESENCE OF ULCERS (sore throat, sores in mouth, or a toothache) UNUSUAL RASH, SWELLING OR PAIN  UNUSUAL VAGINAL DISCHARGE OR ITCHING   Items with * indicate a potential emergency and should be followed up as soon as possible or go to the Emergency Department if any problems should occur.  Please show the CHEMOTHERAPY ALERT CARD or IMMUNOTHERAPY ALERT CARD at  check-in to the Emergency Department and triage nurse.  Should you have questions after your visit or need to cancel or reschedule your appointment, please contact [facility]  Dept: [phone]  and follow the prompts.  Office hours are 8:00 a.m. to 4:30 p.m. Monday - Friday. Please note that voicemails left after 4:00 p.m. may not be returned until the following business day.  We are closed weekends and major holidays. You have access to a nurse at all times for urgent questions. Please call the main number to the clinic Dept: [phone] and follow the prompts.   For any non-urgent questions, you may also contact your provider using MyChart. We now offer e-Visits for anyone 18 and older to request care online for non-urgent symptoms. For details visit [email or web address].   Also download the MyChart app! Go to the app store, search "MyChart", open the app, select [facility], and log in with your MyChart username and password.   

## 2023-03-16 NOTE — Assessment & Plan Note (Signed)
[name] is a 72 year old woman with metastatic breast cancer to the lung noted in April 2014 (see oncologic history above).  1.  Metastatic breast cancer: She will proceed with Herceptin today which she continues to tolerate well.  Her most recent echocardiogram in May was normal.  Next due in November.  Her repeat scans have not yet been scheduled and I gave her the phone number to call and get it scheduled.    2.  Chronic back pain: She sees palliative care regularly.  She is taking oxycodone as needed.  She is seeing [name] today.  [name] will return in 4 weeks for follow-up and her next treatment.  She knows to call for any questions or concerns that may arise between now and her next visit.

## 2023-03-16 NOTE — Progress Notes (Unsigned)
Palliative Medicine [facility]  [phone]) [phone] [phone]) [phone]   Name: [name] Date: 03/16/2023 MRN: [id number]  DOB: Jul 09, 1951  Patient Care Team: [name], MD as PCP - General (Family Medicine) Laurey Morale, MD as Consulting Physician (Cardiology) Rachel Moulds, MD as Consulting Physician (Hematology and Oncology)   INTERVAL HISTORY: [name] is a 72 y.o. female with  medical history including metastatic breast cancer with numerous pulmonary nodules bilaterally s/p chemotherapy currently on herceptin for maintenance. Now with complaints of uncontrolled back pain.  Palliative ask to see for symptom management.   SOCIAL HISTORY:     reports that she quit smoking about 54 years ago. Her smoking use included cigarettes. She started smoking about 59 years ago. She has a 15 pack-year smoking history. She has been exposed to tobacco smoke. She has never used smokeless tobacco. She reports current drug use. Drug: Marijuana. She reports that she does not drink alcohol.  ADVANCE DIRECTIVES:    CODE STATUS:   PAST MEDICAL HISTORY: Past Medical History:  Diagnosis Date   Arthritis    Back pain    Breast cancer (HCC) dx'd 11/2012   left   Chest pain    COPD (chronic obstructive pulmonary disease) (HCC)    Diabetes mellitus without complication (HCC) 09/28/2014   Ear pain    Fatty liver 6/03   Hypertension    Lung disease    Lung metastases dx'd 11/2012   Obesity, unspecified    Other abnormal glucose    Suicide attempt (HCC) 1996   Syncope and collapse    Unspecified sleep apnea     ALLERGIES:  is allergic to meperidine hcl, penicillins, and aspirin.  MEDICATIONS:  Current Outpatient Medications  Medication Sig Dispense Refill   albuterol (VENTOLIN HFA) 108 (90 Base) MCG/ACT inhaler INHALE 2 PUFFS INTO THE LUNGS EVERY 6 HOURS AS NEEDED FOR WHEEZE 8.5 each 1   Blood Glucose Monitoring Suppl (ONE TOUCH ULTRA 2)  w/Device KIT 1 kit by Does not apply route QID. ICD 10-code: E11.49. 1 kit 0   diazepam (VALIUM) 5 MG tablet Take 1 tablet (5 mg total) by mouth every 12 (twelve) hours as needed for anxiety. 45 tablet 0   insulin glargine (LANTUS SOLOSTAR) 100 UNIT/ML Solostar Pen Inject 18 Units into the skin daily.     insulin lispro (HUMALOG KWIKPEN) 100 UNIT/ML KwikPen Inject 6-10 Units into the skin 3 (three) times daily. Inject 10 u subq three times a day with meals 15 mL 3   Insulin Pen Needle (PEN NEEDLES) 30G X 8 MM MISC Please use to inject insulin 4 times daily. 200 each 3   letrozole (FEMARA) 2.5 MG tablet Take 1 tablet (2.5 mg total) by mouth daily. 90 tablet 12   loperamide (IMODIUM) 2 MG capsule TAKE 1 CAPSULE (2 MG TOTAL) BY MOUTH AS NEEDED FOR DIARRHEA OR LOOSE STOOLS. 30 capsule 1   ondansetron (ZOFRAN) 8 MG tablet Take 1 tablet (8 mg total) by mouth every 8 (eight) hours as needed. 20 tablet 2   ONETOUCH ULTRA test strip USE AS INSTRUCTED TO CHECK ONCE DAILY 100 strip 12   Oxycodone HCl 10 MG TABS Take 1 tablet (10 mg total) by mouth every 6 (six) hours as needed. 60 tablet 0   OXYGEN Inhale into the lungs.     Semaglutide, 2 MG/DOSE, (OZEMPIC, 2 MG/DOSE,) 8 MG/3ML SOPN Inject 2 mg into the skin once a week. 3 mL 6  No current facility-administered medications for this visit.    VITAL SIGNS: There were no vitals taken for this visit. There were no vitals filed for this visit.  Estimated body mass index is 51.75 kg/m as calculated from the following:   Height as of 02/16/23: 5\' 4"  (1.626 m).   Weight as of 02/16/23: 301 lb 8 oz (136.8 kg).   PERFORMANCE STATUS (ECOG) : 1 - Symptomatic but completely ambulatory  Assessment NAD, in wheelchair Normal breathing pattern with home oxygen RRR AAO x 4   IMPRESSION:   Neoplasm/Chronic/Neuropathic pain [name] reports pain is ongoing. Some days are better than others. She unfortunately has run out of her pain medication. Pain is constant  in her back and lower extremities.   Will send in refill for her Oxy IR.  We have attempted long-acting medications and Lyrica/Gabapentin in the past however she was not able to tolerate due to nausea and vomiting. We discussed this again today as she is interested in retrying pregablin. On medication review she was on gabapentin from 2017-2021 and then transitioned to pregablin. We discussed plans to restart in the upcoming weeks if no improvement and she is in agreement.   We will continue to closely monitor and adjust medications accordingly.   Constipation   I discussed the importance of continued conversation with family and their medical providers regarding overall plan of care and treatment options, ensuring decisions are within the context of the patients values and GOCs.  PLAN: Oxy IR 10 mg as needed Valium 5mg  as needed   Miralax daily for bowel regimen Ongoing goals of care discussions and support I will plan to see her back in the office in 3-4 weeks in collaboration with her other oncology appointments. Sooner if needed.    Patient expressed understanding and was in agreement with this plan. She also understands that She can call the clinic at any time with any questions, concerns, or complaints.    Any controlled substances utilized were prescribed in the context of palliative care. PDMP has been reviewed.    Visit consisted of counseling and education dealing with the complex and emotionally intense issues of symptom management and palliative care in the setting of serious and potentially life-threatening illness.Greater than 50%  of this time was spent counseling and coordinating care related to the above assessment and plan.  [name], AGPCNP-BC  Palliative Medicine Team/[facility]  *Please note that this is a verbal dictation therefore any spelling or grammatical errors are due to the "[employer] One" system interpretation.

## 2023-03-16 NOTE — Progress Notes (Signed)
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 [facility]) Staging form: Breast, AJCC 7th Edition - Clinical: Stage IV (TX, NX, M1) - Signed by Lowella Dell, MD on 04/17/2015  Malignant neoplasm of upper-inner quadrant of left breast in female, estrogen receptor positive (HCC) Staging form: Breast, AJCC 7th Edition - Clinical: Stage IA (T1c, N0, cM0) - Unsigned Specimen type: Core Needle Biopsy Histopathologic type: 9931 Laterality: Left Staging comments: Staged at breast conference 4.2.14  - Pathologic: Stage IV (M1) - Unsigned Specimen type: Core Needle Biopsy Histopathologic type: 9931 Laterality: Left   SUMMARY OF ONCOLOGIC HISTORY: [name] woman with stage IV breast cancer initially diagnosed March 2014    (1) s/p left breast upper inner quadrant biopsy 11/05/2012 for a clinical T1c NX M1, stage IV invasive ductal carcinoma, grade 3, estrogen and progesterone receptor negative, with an MIB-1 of 77%, and HER-2 amplified by CISH with a ratio of 4.39.             (a) mammography 04/16/2016 shows the left breast mass to have nearly completely resolved   (2) chest, abdomen and pelvis CT scans and PET scan April 2014 showed multiple bilateral pulmonaru nodules but no liver or bone involvement; biopsy of a pulmonary nodule on 11/30/2012 confirmed metastatic breast cancer.              (a) CT in GI obtained 09/28/2014 shows no measurable disease in the lungs             (b) chest CT 12/20/2015 showed stable small right lung pulmonary nodules and an area in the right lower lobe pleural parenchymal thickness requiring attention in future studies              (3) received docetaxel / trastuzumab/ pertuzumab x4, completed 02/07/2013, with a good response,    (4) trastuzumab/ pertuzumab continued every 21 days;             (a) Pertuzumab  discontinued after 07/28/2016, and Trastuzumab given every 4 weeks starting 08/2016             (b) most recent echocardiogram 06/28/2018 shows EF of 60-65% (these will be every 6 months)   (5) anastrozole started 02/15/2013, discontinued October 2014 with poor tolerance   (6) 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     (9)  if and when we documented disease progression we will change the letrozole to fulvestrant and Palbociclib.     CURRENT THERAPY: Letrozole, Herceptin  INTERVAL HISTORY: [name] 72 y.o. female returns for follow-up of her metastatic breast cancer.    Her most recent CT chest abdomen pelvis on November 10, 2022.  This demonstrated enlarging prevascular adenopathy, a 1.5 cm lesion in the pancreas, follow-up of these areas was recommended.  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    [name] underwent radiation for her prevascular adenopathy in May.  Repeat scans were recommended to be completed around this time however they have not  been scheduled.  She continues to take oxycodone IR for her pain.  She sees [place] and palliative care about this.   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  MEDICAL HISTORY: Past Medical History:  Diagnosis Date   Arthritis    Back pain    Breast cancer (HCC) dx'd 11/2012   left   Chest pain    COPD (chronic obstructive pulmonary disease) (HCC)    Diabetes mellitus without complication (HCC) 09/28/2014   Ear pain    Fatty liver 6/03   Hypertension    Lung disease    Lung metastases dx'd 11/2012   Obesity, unspecified    Other abnormal glucose    Suicide attempt (HCC) 1996   Syncope and collapse    Unspecified sleep apnea     SURGICAL HISTORY: Past Surgical History:  Procedure Laterality Date   CARDIAC CATHETERIZATION     2007   CHOLECYSTECTOMY     TUBAL LIGATION      SOCIAL HISTORY: Social History   Socioeconomic History   Marital status: Divorced    Spouse name: Not on file   Number of children: 4   Years of education: 12   Highest education level: 12th grade  Occupational History   Occupation: Disability   Occupation: retired-daycare  Tobacco Use   Smoking status: Former    Current packs/day: 0.00    Average packs/day: 3.0 packs/day for 5.0 years (15.0 ttl pk-yrs)    Types: Cigarettes    Start date: 08/19/1963    Quit date: 08/18/1968    Years since quitting: 54.6    Passive exposure: Past   Smokeless tobacco: Never  Vaping Use   Vaping status: Never Used  Substance and Sexual Activity   Alcohol use: No    Alcohol/week: 0.0 standard drinks of alcohol   Drug use: Yes    Types: Marijuana    Comment: Medical Marijuana   Sexual activity: Not Currently    Partners: Male  Other Topics Concern   Not on file  Social History Narrative   Health Care POA:    Emergency Contact: Amador Cunas [phone]   End of Life Plan:    Who lives  with you: two grandchildren, friend, daughter   Any pets: 3 poodles   Diet: Pt has a variety of protein, starch, vegetables.  Pt is currently working on cutting back on portions for weight loss.   Exercise: Pt has not regular exercise routine.  Occasionally walks around home.   Seatbelts: Pt reports wearing seatbelt occasionally.   Sun Exposure/Protection: Pt does not use sun protection   Hobbies: reading, playing on kindle, ebay         Currently in her home she keeps her granddaughter [name], 16, who is the daughter of the patient's daughter Para March (the patient refers to [name] as "my adopted daughter"); grandson Crownover "[name]" [place], [place], who is [name]'s half-brother;  daughter [name], and an [place] friend, [name], the patient's significant other. Daughter [name] is a Public librarian, currently unemployed. Son [name] works as an Personnel officer in [place]. Daughter Para March is currently in prison due to killing someone in a car accident. Daughter [name] died from aplastic anemia at the age of 53. The patient has a total of 4 grandchildren. She is not a church attender   Social Determinants of Health   Financial Resource Strain: Medium Risk (07/30/2022)   Overall Financial Resource Strain (CARDIA)    Difficulty of Paying Moss Expenses: Somewhat hard  Food Insecurity: No Food Insecurity (07/30/2022)   Hunger Vital Sign    Worried About Running Out of Food in the Last Year: Never true    Ran Out of Food in the Last Year: Never true  Transportation Needs: No Transportation Needs (07/30/2022)   PRAPARE - Administrator, Civil Service (Medical): No    Lack of Transportation (Non-Medical): No  Physical Activity: Inactive (07/30/2022)   Exercise Vital Sign    Days of Exercise per Week: 0 days    Minutes of Exercise per Session: 0 min  Stress: No Stress Concern Present (07/30/2022)   [employer] of Occupational Health -  Occupational Stress Questionnaire    Feeling of Stress : Only a little  Social Connections: Moderately Integrated (07/30/2022)   Social Connection and Isolation Panel [NHANES]    Frequency of Communication with Friends and Family: More than three times a week    Frequency of Social Gatherings with Friends and Family: More than three times a week    Attends Religious Services: More than 4 times per year    Active Member of [employer] or Organizations: Yes    Attends Engineer, structural: More than 4 times per year    Marital Status: Divorced  Intimate Partner Violence: Not At Risk (07/30/2022)   Humiliation, Afraid, Rape, and Kick questionnaire    Fear of Current or Ex-Partner: No    Emotionally Abused: No    Physically Abused: No    Sexually Abused: No    FAMILY HISTORY: Family History  Problem Relation Age of Onset   Coronary artery disease Father 68   Diabetes Father    Heart disease Father    Breast cancer Mother 51   Cancer Mother 55       breast   Aplastic anemia Daughter        died at age 13   Cancer Maternal Aunt 3       ovarian   Cancer Maternal Grandmother 60       ovarian   Cancer Paternal Aunt 45       ovarian/breast/breast   Coronary artery disease Sister 70   Coronary artery disease Brother 67    Review of Systems  Constitutional:  Negative for appetite change, chills, fatigue, fever and unexpected weight change.  HENT:   Negative for hearing loss, lump/mass and trouble swallowing.   Eyes:  Negative for eye problems and icterus.  Respiratory:  Negative for chest tightness, cough and shortness of breath.   Cardiovascular:  Negative for chest pain, leg swelling and palpitations.  Gastrointestinal:  Negative for abdominal distention, abdominal pain, constipation, diarrhea, nausea and vomiting.  Endocrine: Negative for hot flashes.  Genitourinary:  Negative for difficulty urinating.   Musculoskeletal:  Negative for arthralgias.  Skin:  Negative for  itching and rash.  Neurological:  Negative for dizziness, extremity weakness, headaches and numbness.  Hematological:  Negative for adenopathy. Does not bruise/bleed easily.  Psychiatric/Behavioral:  Negative for depression. The patient is not nervous/anxious.       PHYSICAL EXAMINATION   Onc Performance Status - 03/16/23 1437       ECOG Perf Status   ECOG Perf Status Capable of only limited selfcare, confined to bed or chair more than 50% of waking hours      KPS SCALE   KPS % SCORE Cares for self, unable to carry on normal activity or to do active work             Vitals:   03/16/23 1432  BP: (!) 147/88  Pulse: 71  Resp: 17  Temp: 98.1 F (36.7 C)  SpO2: 94%    Physical Exam Constitutional:      General: She is not in acute distress.    Appearance: Normal appearance. She is not toxic-appearing.  HENT:     Head: Normocephalic and atraumatic.     Mouth/Throat:     Mouth: Mucous membranes are moist.     Pharynx: Oropharynx is clear. No oropharyngeal exudate or posterior oropharyngeal erythema.  Eyes:     General: No scleral icterus. Cardiovascular:     Rate and Rhythm: Normal rate and regular rhythm.     Pulses: Normal pulses.     Heart sounds: Normal heart sounds.  Pulmonary:     Effort: Pulmonary effort is normal.     Breath sounds: Normal breath sounds.  Abdominal:     General: Abdomen is flat. Bowel sounds are normal. There is no distension.     Palpations: Abdomen is soft.     Tenderness: There is no abdominal tenderness.  Musculoskeletal:        General: No swelling.     Cervical back: Neck supple.  Lymphadenopathy:     Cervical: No cervical adenopathy.  Skin:    General: Skin is warm and dry.     Findings: No rash.  Neurological:     General: No focal deficit present.     Mental Status: She is alert.  Psychiatric:        Mood and Affect: Mood normal.        Behavior: Behavior normal.     LABORATORY DATA:  CBC    Component Value  Date/Time   WBC 5.2 01/15/2023 1306   WBC 8.0 05/10/2020 0447   RBC 4.72 01/15/2023 1306   HGB 13.9 01/15/2023 1306   HGB 12.8 08/05/2017 0927   HCT 41.8 01/15/2023 1306   HCT 39.8 08/05/2017 0927   PLT 196 01/15/2023 1306   PLT 267 08/05/2017 0927   MCV 88.6 01/15/2023 1306   MCV 86.7 08/05/2017 0927   MCH 29.4 01/15/2023 1306   MCHC 33.3 01/15/2023 1306   RDW 13.9 01/15/2023 1306   RDW 14.3 08/05/2017 0927   LYMPHSABS 1.6 01/15/2023 1306   LYMPHSABS 2.3 08/05/2017 0927   MONOABS 0.4 01/15/2023 1306   MONOABS 0.4 08/05/2017 0927   EOSABS 0.1 01/15/2023 1306   EOSABS 0.1 08/05/2017 0927   BASOSABS 0.0 01/15/2023 1306   BASOSABS 0.0 08/05/2017 0927    CMP     Component Value Date/Time   NA 141 01/15/2023 1306   NA 141 08/05/2017 0927   K 4.3 01/15/2023 1306   K 4.0 08/05/2017 0927   CL 105 01/15/2023 1306   CL 101 02/07/2013 1125   CO2 31 01/15/2023 1306   CO2 31 (H) 08/05/2017 0927   GLUCOSE 130 (H) 01/15/2023 1306  GLUCOSE 125 08/05/2017 0927   GLUCOSE 193 (H) 02/07/2013 1125   BUN 16 01/15/2023 1306   BUN 22.7 08/05/2017 0927   CREATININE 0.64 01/15/2023 1306   CREATININE 0.8 08/05/2017 0927   CALCIUM 9.4 01/15/2023 1306   CALCIUM 9.7 08/05/2017 0927   PROT 7.7 01/15/2023 1306   PROT 7.3 08/05/2017 0927   ALBUMIN 3.9 01/15/2023 1306   ALBUMIN 3.2 (L) 08/05/2017 0927   AST 44 (H) 01/15/2023 1306   AST 19 08/05/2017 0927   ALT 60 (H) 01/15/2023 1306   ALT 28 08/05/2017 0927   ALKPHOS 77 01/15/2023 1306   ALKPHOS 108 08/05/2017 0927   BILITOT 0.5 01/15/2023 1306   BILITOT 0.34 08/05/2017 0927   GFRNONAA >60 01/15/2023 1306   GFRAA >60 05/10/2020 0447   GFRAA >60 04/18/2020 1023      ASSESSMENT and THERAPY PLAN:   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  1.  Metastatic breast cancer: She will proceed with Herceptin today which  she continues to tolerate well.  Her most recent echocardiogram in May was normal.  Next due in November.  Her repeat scans have not yet been scheduled and I gave her the phone number to call and get it scheduled.    2.  Chronic back pain: She sees palliative care regularly.  She is taking oxycodone as needed.  She is seeing [place] today.  [name] will return in 4 weeks for follow-up and her next treatment.  She knows to call for any questions or concerns that may arise between now and her next visit.    All questions were answered. The patient knows to call the clinic with any problems, questions or concerns. We can certainly see the patient much sooner if necessary.  Total encounter time:20 minutes*in face-to-face visit time, chart review, lab review, care coordination, order entry, and documentation of the encounter time.    Lillard Anes, NP 03/16/23 3:34 PM Medical Oncology and Hematology [facility] [address] [place], [place] [address] Tel. [phone]    Fax. [phone]  *Total Encounter Time as defined by the Centers for Medicare and Medicaid Services includes, in addition to the face-to-face time of a patient visit (documented in the note above) non-face-to-face time: obtaining and reviewing outside history, ordering and reviewing medications, tests or procedures, care coordination (communications with other health care professionals or caregivers) and documentation in the medical record.

## 2023-03-17 ENCOUNTER — Encounter: Payer: Self-pay | Admitting: Hematology and Oncology

## 2023-03-27 DIAGNOSIS — E1149 Type 2 diabetes mellitus with other diabetic neurological complication: Secondary | ICD-10-CM | POA: Diagnosis not present

## 2023-03-31 ENCOUNTER — Ambulatory Visit (HOSPITAL_COMMUNITY)
Admission: RE | Admit: 2023-03-31 | Discharge: 2023-03-31 | Disposition: A | Discharge status: Home / Self Care | Payer: 59 | Source: Ambulatory Visit | Attending: Adult Health | Admitting: Adult Health

## 2023-03-31 ENCOUNTER — Encounter (HOSPITAL_COMMUNITY): Payer: Self-pay

## 2023-03-31 DIAGNOSIS — C50919 Malignant neoplasm of unspecified site of unspecified female breast: Secondary | ICD-10-CM | POA: Diagnosis not present

## 2023-03-31 DIAGNOSIS — I517 Cardiomegaly: Secondary | ICD-10-CM | POA: Diagnosis not present

## 2023-03-31 DIAGNOSIS — I700 Atherosclerosis of aorta: Secondary | ICD-10-CM | POA: Insufficient documentation

## 2023-03-31 DIAGNOSIS — E1149 Type 2 diabetes mellitus with other diabetic neurological complication: Secondary | ICD-10-CM | POA: Insufficient documentation

## 2023-03-31 DIAGNOSIS — N200 Calculus of kidney: Secondary | ICD-10-CM | POA: Insufficient documentation

## 2023-03-31 DIAGNOSIS — G8929 Other chronic pain: Secondary | ICD-10-CM | POA: Diagnosis not present

## 2023-03-31 DIAGNOSIS — C771 Secondary and unspecified malignant neoplasm of intrathoracic lymph nodes: Secondary | ICD-10-CM | POA: Diagnosis not present

## 2023-03-31 DIAGNOSIS — M549 Dorsalgia, unspecified: Secondary | ICD-10-CM | POA: Diagnosis not present

## 2023-03-31 DIAGNOSIS — C7800 Secondary malignant neoplasm of unspecified lung: Secondary | ICD-10-CM | POA: Insufficient documentation

## 2023-03-31 DIAGNOSIS — K7460 Unspecified cirrhosis of liver: Secondary | ICD-10-CM | POA: Insufficient documentation

## 2023-03-31 LAB — POCT I-STAT CREATININE: Creatinine, Ser: 0.8 mg/dL (ref 0.44–1.00)

## 2023-03-31 MED ORDER — HEPARIN SOD (PORK) LOCK FLUSH 100 UNIT/ML IV SOLN
500.0000 [IU] | Freq: Once | INTRAVENOUS | Status: AC
  Administered 2023-03-31: 500 [IU] via INTRAVENOUS

## 2023-03-31 MED ORDER — SODIUM CHLORIDE (PF) 0.9 % IJ SOLN
INTRAMUSCULAR | Status: AC
  Filled 2023-03-31: qty 50

## 2023-03-31 MED ORDER — HEPARIN SOD (PORK) LOCK FLUSH 100 UNIT/ML IV SOLN
INTRAVENOUS | Status: AC
  Filled 2023-03-31: qty 5

## 2023-03-31 MED ORDER — IOHEXOL 300 MG/ML  SOLN
100.0000 mL | Freq: Once | INTRAMUSCULAR | Status: AC | PRN
  Administered 2023-03-31: 100 mL via INTRAVENOUS

## 2023-04-06 ENCOUNTER — Other Ambulatory Visit: Payer: Self-pay | Admitting: Nurse Practitioner

## 2023-04-06 ENCOUNTER — Other Ambulatory Visit: Payer: Self-pay

## 2023-04-06 DIAGNOSIS — M792 Neuralgia and neuritis, unspecified: Secondary | ICD-10-CM

## 2023-04-06 DIAGNOSIS — C50212 Malignant neoplasm of upper-inner quadrant of left female breast: Secondary | ICD-10-CM

## 2023-04-10 DIAGNOSIS — M25569 Pain in unspecified knee: Secondary | ICD-10-CM | POA: Diagnosis not present

## 2023-04-10 DIAGNOSIS — U071 COVID-19: Secondary | ICD-10-CM | POA: Diagnosis not present

## 2023-04-10 DIAGNOSIS — I5020 Unspecified systolic (congestive) heart failure: Secondary | ICD-10-CM | POA: Diagnosis not present

## 2023-04-10 DIAGNOSIS — R062 Wheezing: Secondary | ICD-10-CM | POA: Diagnosis not present

## 2023-04-11 DIAGNOSIS — I5020 Unspecified systolic (congestive) heart failure: Secondary | ICD-10-CM | POA: Diagnosis not present

## 2023-04-11 DIAGNOSIS — R062 Wheezing: Secondary | ICD-10-CM | POA: Diagnosis not present

## 2023-04-11 DIAGNOSIS — U071 COVID-19: Secondary | ICD-10-CM | POA: Diagnosis not present

## 2023-04-11 DIAGNOSIS — M25569 Pain in unspecified knee: Secondary | ICD-10-CM | POA: Diagnosis not present

## 2023-04-13 ENCOUNTER — Inpatient Hospital Stay: Payer: 59 | Attending: Oncology

## 2023-04-13 ENCOUNTER — Inpatient Hospital Stay (HOSPITAL_BASED_OUTPATIENT_CLINIC_OR_DEPARTMENT_OTHER): Payer: 59 | Admitting: Nurse Practitioner

## 2023-04-13 ENCOUNTER — Inpatient Hospital Stay (HOSPITAL_BASED_OUTPATIENT_CLINIC_OR_DEPARTMENT_OTHER): Payer: 59 | Admitting: Hematology and Oncology

## 2023-04-13 ENCOUNTER — Encounter: Payer: Self-pay | Admitting: Nurse Practitioner

## 2023-04-13 ENCOUNTER — Other Ambulatory Visit: Payer: Self-pay

## 2023-04-13 VITALS — BP 130/79 | HR 65 | Temp 97.7°F | Resp 16 | Wt 306.4 lb

## 2023-04-13 DIAGNOSIS — C50212 Malignant neoplasm of upper-inner quadrant of left female breast: Secondary | ICD-10-CM

## 2023-04-13 DIAGNOSIS — M792 Neuralgia and neuritis, unspecified: Secondary | ICD-10-CM

## 2023-04-13 DIAGNOSIS — Z5112 Encounter for antineoplastic immunotherapy: Secondary | ICD-10-CM | POA: Insufficient documentation

## 2023-04-13 DIAGNOSIS — Z803 Family history of malignant neoplasm of breast: Secondary | ICD-10-CM | POA: Insufficient documentation

## 2023-04-13 DIAGNOSIS — C7800 Secondary malignant neoplasm of unspecified lung: Secondary | ICD-10-CM | POA: Diagnosis not present

## 2023-04-13 DIAGNOSIS — G8929 Other chronic pain: Secondary | ICD-10-CM | POA: Diagnosis not present

## 2023-04-13 DIAGNOSIS — Z87891 Personal history of nicotine dependence: Secondary | ICD-10-CM | POA: Diagnosis not present

## 2023-04-13 DIAGNOSIS — G893 Neoplasm related pain (acute) (chronic): Secondary | ICD-10-CM

## 2023-04-13 DIAGNOSIS — Z79811 Long term (current) use of aromatase inhibitors: Secondary | ICD-10-CM | POA: Diagnosis not present

## 2023-04-13 DIAGNOSIS — K5903 Drug induced constipation: Secondary | ICD-10-CM

## 2023-04-13 DIAGNOSIS — Z8041 Family history of malignant neoplasm of ovary: Secondary | ICD-10-CM | POA: Diagnosis not present

## 2023-04-13 DIAGNOSIS — Z170 Estrogen receptor positive status [ER+]: Secondary | ICD-10-CM | POA: Diagnosis not present

## 2023-04-13 DIAGNOSIS — M549 Dorsalgia, unspecified: Secondary | ICD-10-CM | POA: Insufficient documentation

## 2023-04-13 DIAGNOSIS — C50919 Malignant neoplasm of unspecified site of unspecified female breast: Secondary | ICD-10-CM | POA: Diagnosis not present

## 2023-04-13 DIAGNOSIS — Z515 Encounter for palliative care: Secondary | ICD-10-CM

## 2023-04-13 DIAGNOSIS — Z86718 Personal history of other venous thrombosis and embolism: Secondary | ICD-10-CM | POA: Diagnosis not present

## 2023-04-13 MED ORDER — TRASTUZUMAB-ANNS CHEMO 150 MG IV SOLR
750.0000 mg | Freq: Once | INTRAVENOUS | Status: AC
  Administered 2023-04-13: 750 mg via INTRAVENOUS
  Filled 2023-04-13: qty 35.71

## 2023-04-13 MED ORDER — SODIUM CHLORIDE 0.9 % IV SOLN: Freq: Once | INTRAVENOUS | Status: AC

## 2023-04-13 MED ORDER — ACETAMINOPHEN 325 MG PO TABS
650.0000 mg | ORAL_TABLET | Freq: Once | ORAL | Status: AC
  Administered 2023-04-13: 650 mg via ORAL
  Filled 2023-04-13: qty 2

## 2023-04-13 MED ORDER — PREGABALIN 75 MG PO CAPS: 75.0000 mg | ORAL_CAPSULE | Freq: Two times a day (BID) | ORAL | 0 refills | Status: DC

## 2023-04-13 MED ORDER — DIPHENHYDRAMINE HCL 25 MG PO CAPS
50.0000 mg | ORAL_CAPSULE | Freq: Once | ORAL | Status: AC
  Administered 2023-04-13: 50 mg via ORAL
  Filled 2023-04-13: qty 2

## 2023-04-13 NOTE — Patient Instructions (Signed)
[facility]  Discharge Instructions: Thank you for choosing [facility] to provide your oncology and hematology care.   If you have a lab appointment with the Cancer Center, please go directly to the Cancer Center and check in at the registration area.   Wear comfortable clothing and clothing appropriate for easy access to any Portacath or PICC line.   We strive to give you quality time with your provider. You may need to reschedule your appointment if you arrive late (15 or more minutes).  Arriving late affects you and other patients whose appointments are after yours.  Also, if you miss three or more appointments without notifying the office, you may be dismissed from the clinic at the provider's discretion.      For prescription refill requests, have your pharmacy contact our office and allow 72 hours for refills to be completed.    Today you received the following chemotherapy and/or immunotherapy agents herceptin      To help prevent nausea and vomiting after your treatment, we encourage you to take your nausea medication as directed.  BELOW ARE SYMPTOMS THAT SHOULD BE REPORTED IMMEDIATELY: *FEVER GREATER THAN 100.4 F (38 C) OR HIGHER *CHILLS OR SWEATING *NAUSEA AND VOMITING THAT IS NOT CONTROLLED WITH YOUR NAUSEA MEDICATION *UNUSUAL SHORTNESS OF BREATH *UNUSUAL BRUISING OR BLEEDING *URINARY PROBLEMS (pain or burning when urinating, or frequent urination) *BOWEL PROBLEMS (unusual diarrhea, constipation, pain near the anus) TENDERNESS IN MOUTH AND THROAT WITH OR WITHOUT PRESENCE OF ULCERS (sore throat, sores in mouth, or a toothache) UNUSUAL RASH, SWELLING OR PAIN  UNUSUAL VAGINAL DISCHARGE OR ITCHING   Items with * indicate a potential emergency and should be followed up as soon as possible or go to the Emergency Department if any problems should occur.  Please show the CHEMOTHERAPY ALERT CARD or IMMUNOTHERAPY ALERT CARD at  check-in to the Emergency Department and triage nurse.  Should you have questions after your visit or need to cancel or reschedule your appointment, please contact [facility]  Dept: [phone]  and follow the prompts.  Office hours are 8:00 a.m. to 4:30 p.m. Monday - Friday. Please note that voicemails left after 4:00 p.m. may not be returned until the following business day.  We are closed weekends and major holidays. You have access to a nurse at all times for urgent questions. Please call the main number to the clinic Dept: [phone] and follow the prompts.   For any non-urgent questions, you may also contact your provider using MyChart. We now offer e-Visits for anyone 18 and older to request care online for non-urgent symptoms. For details visit [email or web address].   Also download the MyChart app! Go to the app store, search "MyChart", open the app, select [facility], and log in with your MyChart username and password.   

## 2023-04-13 NOTE — Progress Notes (Signed)
Palliative Medicine [facility]  [phone] [phone]   Name: [name] Date: 04/13/2023 MRN: [id number]  DOB: August 14, 1951  Patient Care Team: [name], MD as PCP - General (Family Medicine) Laurey Morale, MD as Consulting Physician (Cardiology) Rachel Moulds, MD as Consulting Physician (Hematology and Oncology)   INTERVAL HISTORY: [name] is a 72 y.o. female with  medical history including metastatic breast cancer with numerous pulmonary nodules bilaterally s/p chemotherapy currently on herceptin for maintenance. Now with complaints of uncontrolled back pain.  Palliative ask to see for symptom management.   SOCIAL HISTORY:     reports that she quit smoking about 54 years ago. Her smoking use included cigarettes. She started smoking about 59 years ago. She has a 15 pack-year smoking history. She has been exposed to tobacco smoke. She has never used smokeless tobacco. She reports current drug use. Drug: Marijuana. She reports that she does not drink alcohol.  ADVANCE DIRECTIVES:    CODE STATUS:   PAST MEDICAL HISTORY: Past Medical History:  Diagnosis Date   Arthritis    Back pain    Breast cancer (HCC) dx'd 11/2012   left   Chest pain    COPD (chronic obstructive pulmonary disease) (HCC)    Diabetes mellitus without complication (HCC) 09/28/2014   Ear pain    Fatty liver 6/03   Hypertension    Lung disease    Lung metastases dx'd 11/2012   Obesity, unspecified    Other abnormal glucose    Suicide attempt (HCC) 1996   Syncope and collapse    Unspecified sleep apnea     ALLERGIES:  is allergic to meperidine hcl, penicillins, and aspirin.  MEDICATIONS:  Current Outpatient Medications  Medication Sig Dispense Refill   albuterol (VENTOLIN HFA) 108 (90 Base) MCG/ACT inhaler INHALE 2 PUFFS INTO THE LUNGS EVERY 6 HOURS AS NEEDED FOR WHEEZE 8.5 each 1   Blood Glucose Monitoring Suppl (ONE TOUCH ULTRA 2)  w/Device KIT 1 kit by Does not apply route QID. ICD 10-code: E11.49. 1 kit 0   diazepam (VALIUM) 5 MG tablet Take 1 tablet (5 mg total) by mouth every 12 (twelve) hours as needed for anxiety. 45 tablet 0   insulin glargine (LANTUS SOLOSTAR) 100 UNIT/ML Solostar Pen Inject 18 Units into the skin daily.     insulin lispro (HUMALOG KWIKPEN) 100 UNIT/ML KwikPen Inject 6-10 Units into the skin 3 (three) times daily. Inject 10 u subq three times a day with meals 15 mL 3   Insulin Pen Needle (PEN NEEDLES) 30G X 8 MM MISC Please use to inject insulin 4 times daily. 200 each 3   letrozole (FEMARA) 2.5 MG tablet Take 1 tablet (2.5 mg total) by mouth daily. 90 tablet 12   loperamide (IMODIUM) 2 MG capsule TAKE 1 CAPSULE (2 MG TOTAL) BY MOUTH AS NEEDED FOR DIARRHEA OR LOOSE STOOLS. 30 capsule 1   ondansetron (ZOFRAN) 8 MG tablet Take 1 tablet (8 mg total) by mouth every 8 (eight) hours as needed. 20 tablet 2   ONETOUCH ULTRA test strip USE AS INSTRUCTED TO CHECK ONCE DAILY 100 strip 12   Oxycodone HCl 10 MG TABS Take 1 tablet (10 mg total) by mouth every 6 (six) hours as needed. 60 tablet 0   OXYGEN Inhale into the lungs.     pregabalin (LYRICA) 75 MG capsule Take 1 capsule (75 mg total) by mouth 2 (two) times daily. 60 capsule 0  Semaglutide, 2 MG/DOSE, (OZEMPIC, 2 MG/DOSE,) 8 MG/3ML SOPN Inject 2 mg into the skin once a week. 3 mL 6   No current facility-administered medications for this visit.    VITAL SIGNS: There were no vitals taken for this visit. There were no vitals filed for this visit.  Estimated body mass index is 52.27 kg/m as calculated from the following:   Height as of 02/16/23: 5\' 4"  (1.626 m).   Weight as of 03/16/23: 304 lb 8 oz (138.1 kg).   PERFORMANCE STATUS (ECOG) : 1 - Symptomatic but completely ambulatory  Assessment NAD Normal breathing pattern with home oxygen RRR AAO x 4   IMPRESSION: I saw [name] during her infusion.  No acute distress noted.  Denies nausea,  vomiting, diarrhea.  Endorses ongoing fatigue.  Is taking things one day at a time.  Shares pictures of her daughter who recently celebrated a birthday.  Also speaks to some home maintenance challenges.  Neoplasm/Chronic/Neuropathic pain [name] reports pain is controlled on current regimen. Some days are better than others.  Tolerating Lyrica twice daily.  Has weaned down use of oxycodone.    We will continue to closely monitor and adjust medications accordingly.   Constipation Has been using Dulcolax with minimal response.  Education provided on using  MiraLAX twice daily.  She verbalized understanding.  I discussed the importance of continued conversation with family and their medical providers regarding overall plan of care and treatment options, ensuring decisions are within the context of the patients values and GOCs.  PLAN: Oxy IR 10 mg as needed not requiring daily.  Last refill was in July. Lyrica 75mg  twice daily  Miralax twice daily for bowel regimen Ongoing goals of care discussions and support I will plan to see her back in the office in 3-4 weeks in collaboration with her other oncology appointments. Sooner if needed.    Patient expressed understanding and was in agreement with this plan. She also understands that She can call the clinic at any time with any questions, concerns, or complaints.    Any controlled substances utilized were prescribed in the context of palliative care. PDMP has been reviewed.    Visit consisted of counseling and education dealing with the complex and emotionally intense issues of symptom management and palliative care in the setting of serious and potentially life-threatening illness.Greater than 50%  of this time was spent counseling and coordinating care related to the above assessment and plan.  [name], AGPCNP-BC  Palliative Medicine Team/ Cancer Center  *Please note that this is a verbal dictation therefore any  spelling or grammatical errors are due to the "Dragon [employer] One" system interpretation.

## 2023-04-13 NOTE — Progress Notes (Signed)
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 [facility]) Staging form: Breast, AJCC 7th Edition - Clinical: Stage IV (TX, NX, M1) - Signed by Lowella Dell, MD on 04/17/2015  Malignant neoplasm of upper-inner quadrant of left breast in female, estrogen receptor positive (HCC) Staging form: Breast, AJCC 7th Edition - Clinical: Stage IA (T1c, N0, cM0) - Unsigned Specimen type: Core Needle Biopsy Histopathologic type: 9931 Laterality: Left Staging comments: Staged at breast conference 4.2.14  - Pathologic: Stage IV (M1) - Unsigned Specimen type: Core Needle Biopsy Histopathologic type: 9931 Laterality: Left   SUMMARY OF ONCOLOGIC HISTORY: 72 y.o. [name] with stage IV breast cancer initially diagnosed March 2014    (1) s/p left breast upper inner quadrant biopsy 11/05/2012 for a clinical T1c NX M1, stage IV invasive ductal carcinoma, grade 3, estrogen and progesterone receptor negative, with an MIB-1 of 77%, and HER-2 amplified by CISH with a ratio of 4.39.             (a) mammography 04/16/2016 shows the left breast mass to have nearly completely resolved   (2) chest, abdomen and pelvis CT scans and PET scan April 2014 showed multiple bilateral pulmonaru nodules but no liver or bone involvement; biopsy of a pulmonary nodule on 11/30/2012 confirmed metastatic breast cancer.              (a) CT in GI obtained 09/28/2014 shows no measurable disease in the lungs             (b) chest CT 12/20/2015 showed stable small right lung pulmonary nodules and an area in the right lower lobe pleural parenchymal thickness requiring attention in future studies              (3) received docetaxel / trastuzumab/ pertuzumab x4, completed 02/07/2013, with a good response,    (4) trastuzumab/ pertuzumab continued every 21 days;             (a) Pertuzumab  discontinued after 07/28/2016, and Trastuzumab given every 4 weeks starting 08/2016             (b) most recent echocardiogram 06/28/2018 shows EF of 60-65% (these will be every 6 months)   (5) anastrozole started 02/15/2013, discontinued October 2014 with poor tolerance   (6) 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   CURRENT THERAPY: Letrozole, Herceptin  INTERVAL HISTORY:  [name] 72 y.o. female returns for follow-up prior to receiving Herceptin therapy.  Since her last visit here, she is doing quite well.  Back pain is well-controlled, seeing pain management for medication.  She has some constipation secondary to narcotics but otherwise overall she is doing okay.  No change in breathing.  She is very worried about the COVID infections that are happening once again.  She was curious about the scan results.  No side effects from Herceptin or letrozole.  She tells me that she does not want to do scans often because it is unbearable for her to get on the table with her underlying back issues. Rest of the pertinent 10 point ROS reviewed and neg.  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  MEDICAL HISTORY: Past Medical History:  Diagnosis Date   Arthritis    Back pain    Breast cancer (HCC) dx'd 11/2012   left   Chest pain    COPD (chronic obstructive pulmonary disease) (HCC)    Diabetes mellitus without complication (HCC) 09/28/2014   Ear pain    Fatty liver 6/03   Hypertension    Lung disease    Lung metastases dx'd 11/2012   Obesity, unspecified    Other abnormal glucose    Suicide attempt (HCC) 1996   Syncope and collapse    Unspecified sleep apnea     SURGICAL HISTORY: Past Surgical History:  Procedure Laterality Date   CARDIAC CATHETERIZATION     2007   CHOLECYSTECTOMY     TUBAL LIGATION      SOCIAL HISTORY: Social History   Socioeconomic History   Marital status: Divorced    Spouse name: Not on file   Number of children: 4   Years of education: 12   Highest education level: 12th grade  Occupational History   Occupation: Disability   Occupation: retired-daycare  Tobacco Use   Smoking status: Former    Current packs/day: 0.00    Average packs/day: 3.0 packs/day for 5.0 years (15.0 ttl pk-yrs)    Types: Cigarettes    Start date: 08/19/1963    Quit date: 08/18/1968    Years since quitting: 54.6    Passive exposure: Past   Smokeless tobacco: Never  Vaping Use   Vaping status: Never Used  Substance and Sexual Activity   Alcohol use: No    Alcohol/week: 0.0 standard drinks of alcohol   Drug use: Yes    Types: Marijuana    Comment: Medical Marijuana   Sexual activity: Not Currently    Partners: Male  Other Topics Concern   Not on file  Social History Narrative   Health Care POA:    Emergency Contact: Amador Cunas [phone]   End of Life Plan:    Who lives with you: two grandchildren, friend, daughter   Any pets: 3 poodles   Diet: Pt has a variety of protein, starch, vegetables.  Pt is currently working on cutting back on portions for weight loss.   Exercise: Pt has not regular exercise routine.   Occasionally walks around home.   Seatbelts: Pt reports wearing seatbelt occasionally.   Sun Exposure/Protection: Pt does not use sun protection   Hobbies: reading, playing on kindle, ebay         Currently in her home she keeps her granddaughter [name], 16, who is the daughter of the patient's daughter Para March (the patient refers to [name] as "my adopted daughter"); grandson Juniper "[name]" Ebsen, [place], who is [name]'s half-brother; daughter [name], and an [place] friend, [name], the patient's significant other. Daughter [name] is a Public librarian, currently unemployed. Son [name] works as an Personnel officer  in [place]. Daughter Para March is currently in prison due to killing someone in a car accident. Daughter [name] died from aplastic anemia at the age of 65. The patient has a total of 4 grandchildren. She is not a church attender   Social Determinants of Health   Financial Resource Strain: Medium Risk (07/30/2022)   Overall Financial Resource Strain (CARDIA)    Difficulty of Paying Living Expenses: Somewhat hard  Food Insecurity: No Food Insecurity (07/30/2022)   Hunger Vital Sign    Worried About Running Out of Food in the Last Year: Never true    Ran Out of Food in the Last Year: Never true  Transportation Needs: No Transportation Needs (07/30/2022)   PRAPARE - Administrator, Civil Service (Medical): No    Lack of Transportation (Non-Medical): No  Physical Activity: Inactive (07/30/2022)   Exercise Vital Sign    Days of Exercise per Week: 0 days    Minutes of Exercise per Session: 0 min  Stress: No Stress Concern Present (07/30/2022)   [employer] of Occupational Health - Occupational Stress Questionnaire    Feeling of Stress : Only a little  Social Connections: Moderately Integrated (07/30/2022)   Social Connection and Isolation Panel [NHANES]    Frequency of Communication with Friends and Family: More than three  times a week    Frequency of Social Gatherings with Friends and Family: More than three times a week    Attends Religious Services: More than 4 times per year    Active Member of [employer] or Organizations: Yes    Attends Engineer, structural: More than 4 times per year    Marital Status: Divorced  Intimate Partner Violence: Not At Risk (07/30/2022)   Humiliation, Afraid, Rape, and Kick questionnaire    Fear of Current or Ex-Partner: No    Emotionally Abused: No    Physically Abused: No    Sexually Abused: No    FAMILY HISTORY: Family History  Problem Relation Age of Onset   Coronary artery disease Father 57   Diabetes Father    Heart disease Father    Breast cancer Mother 12   Cancer Mother 28       breast   Aplastic anemia Daughter        died at age 74   Cancer Maternal Aunt 75       ovarian   Cancer Maternal Grandmother 55       ovarian   Cancer Paternal Aunt 28       ovarian/breast/breast   Coronary artery disease Sister 52   Coronary artery disease Brother 71    Review of Systems  Constitutional:  Negative for appetite change, chills, fatigue, fever and unexpected weight change.  HENT:   Negative for hearing loss, lump/mass and trouble swallowing.   Eyes:  Negative for eye problems and icterus.  Respiratory:  Negative for chest tightness, cough and shortness of breath.   Cardiovascular:  Negative for chest pain, leg swelling and palpitations.  Gastrointestinal:  Negative for abdominal distention, abdominal pain, constipation, diarrhea, nausea and vomiting.  Endocrine: Negative for hot flashes.  Genitourinary:  Negative for difficulty urinating.   Musculoskeletal:  Positive for back pain. Negative for arthralgias.  Skin:  Negative for itching and rash.  Neurological:  Negative for dizziness, extremity weakness, headaches and numbness.  Hematological:  Negative for adenopathy. Does not bruise/bleed easily.  Psychiatric/Behavioral:  Negative for depression. The  patient is not nervous/anxious.  PHYSICAL EXAMINATION  ECOG PERFORMANCE STATUS: 3 - Symptomatic, >50% confined to bed  Vitals:   04/13/23 1257  BP: 130/79  Pulse: 65  Resp: 16  Temp: 97.7 F (36.5 C)  SpO2: 98%    Physical Exam Constitutional:      General: She is not in acute distress.    Appearance: Normal appearance. She is not toxic-appearing.  HENT:     Head: Normocephalic and atraumatic.  Eyes:     General: No scleral icterus. Cardiovascular:     Rate and Rhythm: Normal rate and regular rhythm.     Pulses: Normal pulses.     Heart sounds: Normal heart sounds.  Pulmonary:     Effort: Pulmonary effort is normal.     Breath sounds: Normal breath sounds.  Abdominal:     General: Abdomen is flat. Bowel sounds are normal. There is no distension.     Palpations: Abdomen is soft.     Tenderness: There is no abdominal tenderness.  Musculoskeletal:        General: No swelling.     Cervical back: Neck supple.  Lymphadenopathy:     Cervical: No cervical adenopathy.  Skin:    General: Skin is warm and dry.     Findings: No rash.  Neurological:     General: No focal deficit present.     Mental Status: She is alert.  Psychiatric:        Mood and Affect: Mood normal.        Behavior: Behavior normal.     LABORATORY DATA:  CBC    Component Value Date/Time   WBC 5.2 01/15/2023 1306   WBC 8.0 05/10/2020 0447   RBC 4.72 01/15/2023 1306   HGB 13.9 01/15/2023 1306   HGB 12.8 08/05/2017 0927   HCT 41.8 01/15/2023 1306   HCT 39.8 08/05/2017 0927   PLT 196 01/15/2023 1306   PLT 267 08/05/2017 0927   MCV 88.6 01/15/2023 1306   MCV 86.7 08/05/2017 0927   MCH 29.4 01/15/2023 1306   MCHC 33.3 01/15/2023 1306   RDW 13.9 01/15/2023 1306   RDW 14.3 08/05/2017 0927   LYMPHSABS 1.6 01/15/2023 1306   LYMPHSABS 2.3 08/05/2017 0927   MONOABS 0.4 01/15/2023 1306   MONOABS 0.4 08/05/2017 0927   EOSABS 0.1 01/15/2023 1306   EOSABS 0.1 08/05/2017 0927   BASOSABS 0.0  01/15/2023 1306   BASOSABS 0.0 08/05/2017 0927    CMP     Component Value Date/Time   NA 141 01/15/2023 1306   NA 141 08/05/2017 0927   K 4.3 01/15/2023 1306   K 4.0 08/05/2017 0927   CL 105 01/15/2023 1306   CL 101 02/07/2013 1125   CO2 31 01/15/2023 1306   CO2 31 (H) 08/05/2017 0927   GLUCOSE 130 (H) 01/15/2023 1306   GLUCOSE 125 08/05/2017 0927   GLUCOSE 193 (H) 02/07/2013 1125   BUN 16 01/15/2023 1306   BUN 22.7 08/05/2017 0927   CREATININE 0.80 03/31/2023 1510   CREATININE 0.64 01/15/2023 1306   CREATININE 0.8 08/05/2017 0927   CALCIUM 9.4 01/15/2023 1306   CALCIUM 9.7 08/05/2017 0927   PROT 7.7 01/15/2023 1306   PROT 7.3 08/05/2017 0927   ALBUMIN 3.9 01/15/2023 1306   ALBUMIN 3.2 (L) 08/05/2017 0927   AST 44 (H) 01/15/2023 1306   AST 19 08/05/2017 0927   ALT 60 (H) 01/15/2023 1306   ALT 28 08/05/2017 0927   ALKPHOS 77 01/15/2023 1306   ALKPHOS 108 08/05/2017 0927  BILITOT 0.5 01/15/2023 1306   BILITOT 0.34 08/05/2017 0927   GFRNONAA >60 01/15/2023 1306   GFRAA >60 05/10/2020 0447   GFRAA >60 04/18/2020 1023   ASSESSMENT and THERAPY PLAN:   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   1.  Metastatic breast cancer: Most recent scan with pre vascular adenopathy increasing in size suggestive of progression. Since thi appears to be the only area for progression, we discussed that she could consider palliative radiation and continue herceptin maintenance.  She had palliative radiation, did not tolerate this well given the back pain and her positioning.  She most recently had imaging which showed response to therapy.  There appears to be no other disease or evidence of progression.  Once again she tells me that she is not very keen to do frequent scans, she says she does not want to do any aggressive treatments and if the disease continues to get worse  she is okay with staying comfortable.   At this time there appears to be no active disease hence I think she will do well in the long run.  Will continue Herceptin and letrozole for now.  If she has any evidence of disease progression we will plan to switch to Kadcyla.  2.  Chronic back pain: Continue follow-up with palliative care.  3. Low attenuation lesion in uncinate process of pancreas, possibly new.  This has appeared stable compared to the scan from March.  We once again discussed about MRI but she does not want to proceed with this.  We will continue to monitor this on periodic scans I recommended that she continue on letrozole and receive Herceptin every 4 weeks.    All questions were answered. The patient knows to call the clinic with any problems, questions or concerns. We can certainly see the patient much sooner if necessary.  Total encounter time:30 minutes*in face-to-face visit time, chart review, lab review, care coordination, order entry, and documentation of the encounter time.    *Total Encounter Time as defined by the Centers for Medicare and Medicaid Services includes, in addition to the face-to-face time of a patient visit (documented in the note above) non-face-to-face time: obtaining and reviewing outside history, ordering and reviewing medications, tests or procedures, care coordination (communications with other health care professionals or caregivers) and documentation in the medical record.

## 2023-04-27 DIAGNOSIS — E1149 Type 2 diabetes mellitus with other diabetic neurological complication: Secondary | ICD-10-CM | POA: Diagnosis not present

## 2023-04-29 ENCOUNTER — Other Ambulatory Visit: Payer: Self-pay | Admitting: Hematology and Oncology

## 2023-05-04 ENCOUNTER — Encounter: Payer: Self-pay | Admitting: Adult Health

## 2023-05-04 ENCOUNTER — Inpatient Hospital Stay: Payer: 59 | Attending: Oncology | Admitting: Adult Health

## 2023-05-04 ENCOUNTER — Encounter: Payer: Self-pay | Admitting: Hematology and Oncology

## 2023-05-04 ENCOUNTER — Inpatient Hospital Stay: Payer: 59

## 2023-05-04 VITALS — BP 168/72 | HR 62 | Temp 97.7°F | Resp 18 | Ht 64.0 in

## 2023-05-04 DIAGNOSIS — C50919 Malignant neoplasm of unspecified site of unspecified female breast: Secondary | ICD-10-CM | POA: Diagnosis not present

## 2023-05-04 DIAGNOSIS — Z79811 Long term (current) use of aromatase inhibitors: Secondary | ICD-10-CM | POA: Insufficient documentation

## 2023-05-04 DIAGNOSIS — C50212 Malignant neoplasm of upper-inner quadrant of left female breast: Secondary | ICD-10-CM | POA: Insufficient documentation

## 2023-05-04 DIAGNOSIS — Z170 Estrogen receptor positive status [ER+]: Secondary | ICD-10-CM

## 2023-05-04 DIAGNOSIS — G8929 Other chronic pain: Secondary | ICD-10-CM | POA: Diagnosis not present

## 2023-05-04 DIAGNOSIS — M549 Dorsalgia, unspecified: Secondary | ICD-10-CM | POA: Insufficient documentation

## 2023-05-04 DIAGNOSIS — Z87891 Personal history of nicotine dependence: Secondary | ICD-10-CM | POA: Insufficient documentation

## 2023-05-04 DIAGNOSIS — C7800 Secondary malignant neoplasm of unspecified lung: Secondary | ICD-10-CM

## 2023-05-04 DIAGNOSIS — Z5112 Encounter for antineoplastic immunotherapy: Secondary | ICD-10-CM | POA: Diagnosis not present

## 2023-05-04 MED ORDER — DIPHENHYDRAMINE HCL 25 MG PO CAPS
50.0000 mg | ORAL_CAPSULE | Freq: Once | ORAL | Status: AC
  Administered 2023-05-04: 50 mg via ORAL
  Filled 2023-05-04: qty 2

## 2023-05-04 MED ORDER — ACETAMINOPHEN 325 MG PO TABS
650.0000 mg | ORAL_TABLET | Freq: Once | ORAL | Status: AC
  Administered 2023-05-04: 650 mg via ORAL
  Filled 2023-05-04: qty 2

## 2023-05-04 MED ORDER — TRASTUZUMAB-ANNS CHEMO 150 MG IV SOLR
750.0000 mg | Freq: Once | INTRAVENOUS | Status: AC
  Administered 2023-05-04: 750 mg via INTRAVENOUS
  Filled 2023-05-04: qty 35.71

## 2023-05-04 MED ORDER — SODIUM CHLORIDE 0.9 % IV SOLN: Freq: Once | INTRAVENOUS | Status: AC

## 2023-05-04 NOTE — Assessment & Plan Note (Signed)
[name] is a 72 year old woman with metastatic breast cancer to the lung noted in April 2014 (see oncologic history above).  1.  Metastatic breast cancer: She will proceed with Herceptin today which she continues to tolerate well.  She will continue letrozole daily.  Her scans show no progression of her metastatic disease.   Her most recent echocardiogram in May was normal.  Next due in November.   2.  Chronic back pain: She sees palliative care regularly.  She is taking oxycodone as needed and has discontinued lyrica.  I recommended she f/u with Lowella Bandy and let her know.    Vema will return in 4 weeks for follow-up and her next treatment.  She knows to call for any questions or concerns that may arise between now and her next visit.

## 2023-05-04 NOTE — Progress Notes (Signed)
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 [facility]) Staging form: Breast, AJCC 7th Edition - Clinical: Stage IV (TX, NX, M1) - Signed by Lowella Dell, MD on 04/17/2015  Malignant neoplasm of upper-inner quadrant of left breast in female, estrogen receptor positive (HCC) Staging form: Breast, AJCC 7th Edition - Clinical: Stage IA (T1c, N0, cM0) - Unsigned Specimen type: Core Needle Biopsy Histopathologic type: 9931 Laterality: Left Staging comments: Staged at breast conference 4.2.14  - Pathologic: Stage IV (M1) - Unsigned Specimen type: Core Needle Biopsy Histopathologic type: 9931 Laterality: Left   SUMMARY OF ONCOLOGIC HISTORY: 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 Darnelle Catalan     05/23/2013 Initial Diagnosis   Malignant neoplasm of upper-inner quadrant of left breast in female, estrogen receptor positive (HCC)     CURRENT THERAPY: Herceptin, letrozole  INTERVAL HISTORY: ANVITHA LYBROOK 72 y.o. female returns for the static breast cancer.  She has back pain and is seeing palliative care for this.  She is no longer taking Lyrica.  She says that she had difficulty with picking it up from the pharmacy so she decided to stop taking the Lyrica.  She says she only takes the oxycodone when she feels like she is going to pass out.  We have Herceptin every 3 weeks and takes letrozole daily with good tolerance.  She denies any  significant issues with these.  Her most recent restaging scans occurred in August 2024 demonstrating improvement in the prevascular lymphadenopathy that we were concerned about.   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  MEDICAL HISTORY: Past Medical History:  Diagnosis Date   Arthritis    Back pain    Breast cancer (HCC) dx'd 11/2012   left   Chest pain    COPD (chronic obstructive pulmonary disease) (HCC)    Diabetes mellitus without complication (HCC) 09/28/2014   Ear pain    Fatty liver 6/03   Hypertension    Lung disease    Lung metastases dx'd 11/2012   Obesity, unspecified    Other abnormal glucose    Suicide attempt (HCC) 1996   Syncope and collapse    Unspecified sleep apnea     SURGICAL HISTORY: Past Surgical History:  Procedure Laterality Date   CARDIAC CATHETERIZATION     2007   CHOLECYSTECTOMY  TUBAL LIGATION      SOCIAL HISTORY: Social History   Socioeconomic History   Marital status: Divorced    Spouse name: Not on file   Number of children: 4   Years of education: 12   Highest education level: 12th grade  Occupational History   Occupation: Disability   Occupation: retired-daycare  Tobacco Use   Smoking status: Former    Current packs/day: 0.00    Average packs/day: 3.0 packs/day for 5.0 years (15.0 ttl pk-yrs)    Types:  Cigarettes    Start date: 08/19/1963    Quit date: 08/18/1968    Years since quitting: 54.7    Passive exposure: Past   Smokeless tobacco: Never  Vaping Use   Vaping status: Never Used  Substance and Sexual Activity   Alcohol use: No    Alcohol/week: 0.0 standard drinks of alcohol   Drug use: Yes    Types: Marijuana    Comment: Medical Marijuana   Sexual activity: Not Currently    Partners: Male  Other Topics Concern   Not on file  Social History Narrative   Health Care POA:    Emergency Contact: Amador Cunas [phone]   End of Life Plan:    Who lives with you: two grandchildren, friend, daughter   Any pets: 3 poodles   Diet: Pt has a variety of protein, starch, vegetables.  Pt is currently working on cutting back on portions for weight loss.   Exercise: Pt has not regular exercise routine.  Occasionally walks around home.   Seatbelts: Pt reports wearing seatbelt occasionally.   Sun Exposure/Protection: Pt does not use sun protection   Hobbies: reading, playing on kindle, ebay         Currently in her home she keeps her granddaughter [name], 16, who is the daughter of the patient's daughter Para March (the patient refers to [name] as "my adopted daughter"); grandson Ritchison "[name]" Derflinger, [place], who is [name]'s half-brother; daughter [name], and an [place] friend, [name], the patient's significant other. Daughter [name] is a Public librarian, currently unemployed. Son [name] works as an Personnel officer in [place]. Daughter Para March is currently in prison due to killing someone in a car accident. Daughter [name] died from aplastic anemia at the age of 21. The patient has a total of 4 grandchildren. She is not a church attender   Social Determinants of Health   Financial Resource Strain: High Risk (05/02/2023)   Overall Financial Resource Strain (CARDIA)    Difficulty of Paying Living Expenses: Very hard  Food Insecurity: Food Insecurity Present  (05/02/2023)   Hunger Vital Sign    Worried About Running Out of Food in the Last Year: Often true    Ran Out of Food in the Last Year: Often true  Transportation Needs: Unmet Transportation Needs (05/02/2023)   PRAPARE - Administrator, Civil Service (Medical): Yes    Lack of Transportation (Non-Medical): Yes  Physical Activity: Unknown (05/02/2023)   Exercise Vital Sign    Days of Exercise per Week: Patient declined    Minutes of Exercise per Session: 0 min  Stress: Stress Concern Present (05/02/2023)   [employer] of Occupational Health - Occupational Stress Questionnaire    Feeling of Stress : To some extent  Social Connections: Socially Isolated (05/02/2023)   Social Connection and Isolation Panel [NHANES]    Frequency of Communication with Friends and Family: Twice a week    Frequency of Social Gatherings with Friends and Family:  Never    Attends Religious Services: Never    Active Member of Clubs or Organizations: No    Attends Banker Meetings: More than 4 times per year    Marital Status: Widowed  Intimate Partner Violence: Not At Risk (07/30/2022)   Humiliation, Afraid, Rape, and Kick questionnaire    Fear of Current or Ex-Partner: No    Emotionally Abused: No    Physically Abused: No    Sexually Abused: No    FAMILY HISTORY: Family History  Problem Relation Age of Onset   Coronary artery disease Father 11   Diabetes Father    Heart disease Father    Breast cancer Mother 32   Cancer Mother 61       breast   Aplastic anemia Daughter        died at age 52   Cancer Maternal Aunt 68       ovarian   Cancer Maternal Grandmother 42       ovarian   Cancer Paternal Aunt 39       ovarian/breast/breast   Coronary artery disease Sister 24   Coronary artery disease Brother 26    Review of Systems  Constitutional:  Negative for appetite change, chills, fatigue, fever and unexpected weight change.  HENT:   Negative for hearing loss, lump/mass  and trouble swallowing.   Eyes:  Negative for eye problems and icterus.  Respiratory:  Negative for chest tightness, cough and shortness of breath.   Cardiovascular:  Negative for chest pain, leg swelling and palpitations.  Gastrointestinal:  Negative for abdominal distention, abdominal pain, constipation, diarrhea, nausea and vomiting.  Endocrine: Negative for hot flashes.  Genitourinary:  Negative for difficulty urinating.   Musculoskeletal:  Positive for back pain. Negative for arthralgias.  Skin:  Negative for itching and rash.  Neurological:  Negative for dizziness, extremity weakness, headaches and numbness.  Hematological:  Negative for adenopathy. Does not bruise/bleed easily.  Psychiatric/Behavioral:  Negative for depression. The patient is not nervous/anxious.       PHYSICAL EXAMINATION    Vitals:   05/04/23 1405  BP: (!) 168/72  Pulse: 62  Resp: 18  Temp: 97.7 F (36.5 C)  SpO2: 95%    Physical Exam Constitutional:      General: She is not in acute distress.    Appearance: Normal appearance. She is not toxic-appearing.     Comments: Obese, examined in wheelchair, difficult exam secondary to body habitus  HENT:     Head: Normocephalic and atraumatic.     Mouth/Throat:     Mouth: Mucous membranes are moist.     Pharynx: Oropharynx is clear. No oropharyngeal exudate or posterior oropharyngeal erythema.  Eyes:     General: No scleral icterus. Cardiovascular:     Rate and Rhythm: Normal rate and regular rhythm.     Pulses: Normal pulses.     Heart sounds: Normal heart sounds.  Pulmonary:     Effort: Pulmonary effort is normal.     Breath sounds: Normal breath sounds.  Abdominal:     General: Abdomen is flat. Bowel sounds are normal.     Palpations: Abdomen is soft.  Musculoskeletal:        General: No swelling.     Cervical back: Neck supple.  Lymphadenopathy:     Cervical: No cervical adenopathy.  Skin:    General: Skin is warm and dry.     Findings:  No rash.  Neurological:     General: No focal  deficit present.     Mental Status: She is alert.  Psychiatric:        Mood and Affect: Mood normal.        Behavior: Behavior normal.     LABORATORY DATA:  CBC    Component Value Date/Time   WBC 5.2 01/15/2023 1306   WBC 8.0 05/10/2020 0447   RBC 4.72 01/15/2023 1306   HGB 13.9 01/15/2023 1306   HGB 12.8 08/05/2017 0927   HCT 41.8 01/15/2023 1306   HCT 39.8 08/05/2017 0927   PLT 196 01/15/2023 1306   PLT 267 08/05/2017 0927   MCV 88.6 01/15/2023 1306   MCV 86.7 08/05/2017 0927   MCH 29.4 01/15/2023 1306   MCHC 33.3 01/15/2023 1306   RDW 13.9 01/15/2023 1306   RDW 14.3 08/05/2017 0927   LYMPHSABS 1.6 01/15/2023 1306   LYMPHSABS 2.3 08/05/2017 0927   MONOABS 0.4 01/15/2023 1306   MONOABS 0.4 08/05/2017 0927   EOSABS 0.1 01/15/2023 1306   EOSABS 0.1 08/05/2017 0927   BASOSABS 0.0 01/15/2023 1306   BASOSABS 0.0 08/05/2017 0927    CMP     Component Value Date/Time   NA 141 01/15/2023 1306   NA 141 08/05/2017 0927   K 4.3 01/15/2023 1306   K 4.0 08/05/2017 0927   CL 105 01/15/2023 1306   CL 101 02/07/2013 1125   CO2 31 01/15/2023 1306   CO2 31 (H) 08/05/2017 0927   GLUCOSE 130 (H) 01/15/2023 1306   GLUCOSE 125 08/05/2017 0927   GLUCOSE 193 (H) 02/07/2013 1125   BUN 16 01/15/2023 1306   BUN 22.7 08/05/2017 0927   CREATININE 0.80 03/31/2023 1510   CREATININE 0.64 01/15/2023 1306   CREATININE 0.8 08/05/2017 0927   CALCIUM 9.4 01/15/2023 1306   CALCIUM 9.7 08/05/2017 0927   PROT 7.7 01/15/2023 1306   PROT 7.3 08/05/2017 0927   ALBUMIN 3.9 01/15/2023 1306   ALBUMIN 3.2 (L) 08/05/2017 0927   AST 44 (H) 01/15/2023 1306   AST 19 08/05/2017 0927   ALT 60 (H) 01/15/2023 1306   ALT 28 08/05/2017 0927   ALKPHOS 77 01/15/2023 1306   ALKPHOS 108 08/05/2017 0927   BILITOT 0.5 01/15/2023 1306   BILITOT 0.34 08/05/2017 0927   GFRNONAA >60 01/15/2023 1306   GFRAA >60 05/10/2020 0447   GFRAA >60 04/18/2020 1023     ASSESSMENT and THERAPY PLAN:   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  1.  Metastatic breast cancer: She will proceed with Herceptin today which she continues to tolerate well.  She will continue letrozole daily.  Her scans show no progression of her metastatic disease.   Her most recent echocardiogram in May was normal.  Next due in November.   2.  Chronic back pain: She sees palliative care regularly.  She is taking oxycodone as needed and has discontinued lyrica.  I recommended she f/u with Lowella Bandy and let her know.    Tauriel will return in 4 weeks for follow-up and her next treatment.  She knows to call for any questions or concerns that may arise between now and her next visit.     All questions were answered. The patient knows to call the clinic with any problems, questions or concerns. We can certainly see the patient much sooner if necessary.  Total encounter time:20 minutes*in face-to-face visit time, chart review, lab review, care coordination,  order entry, and documentation of the encounter time.    Lillard Anes, NP 05/04/23 3:38 PM Medical Oncology and Hematology [facility] [address] [place], [place] [address] Tel. [phone]    Fax. [phone]  *Total Encounter Time as defined by the Centers for Medicare and Medicaid Services includes, in addition to the face-to-face time of a patient visit (documented in the note above) non-face-to-face time: obtaining and reviewing outside history, ordering and reviewing medications, tests or procedures, care coordination (communications with other health care professionals or caregivers) and documentation in the medical record.

## 2023-05-04 NOTE — Patient Instructions (Signed)
[place] [facility]  Discharge Instructions: Thank you for choosing [facility] to provide your oncology and hematology care.   If you have a lab appointment with the Cancer Center, please go directly to the Cancer Center and check in at the registration area.   Wear comfortable clothing and clothing appropriate for easy access to any Portacath or PICC line.   We strive to give you quality time with your provider. You may need to reschedule your appointment if you arrive late (15 or more minutes).  Arriving late affects you and other patients whose appointments are after yours.  Also, if you miss three or more appointments without notifying the office, you may be dismissed from the clinic at the provider's discretion.      For prescription refill requests, have your pharmacy contact our office and allow 72 hours for refills to be completed.    Today you received the following chemotherapy and/or immunotherapy agents herceptin      To help prevent nausea and vomiting after your treatment, we encourage you to take your nausea medication as directed.  BELOW ARE SYMPTOMS THAT SHOULD BE REPORTED IMMEDIATELY: *FEVER GREATER THAN 100.4 F (38 C) OR HIGHER *CHILLS OR SWEATING *NAUSEA AND VOMITING THAT IS NOT CONTROLLED WITH YOUR NAUSEA MEDICATION *UNUSUAL SHORTNESS OF BREATH *UNUSUAL BRUISING OR BLEEDING *URINARY PROBLEMS (pain or burning when urinating, or frequent urination) *BOWEL PROBLEMS (unusual diarrhea, constipation, pain near the anus) TENDERNESS IN MOUTH AND THROAT WITH OR WITHOUT PRESENCE OF ULCERS (sore throat, sores in mouth, or a toothache) UNUSUAL RASH, SWELLING OR PAIN  UNUSUAL VAGINAL DISCHARGE OR ITCHING   Items with * indicate a potential emergency and should be followed up as soon as possible or go to the Emergency Department if any problems should occur.  Please show the CHEMOTHERAPY ALERT CARD or IMMUNOTHERAPY ALERT CARD at  check-in to the Emergency Department and triage nurse.  Should you have questions after your visit or need to cancel or reschedule your appointment, please contact [place] [facility]  Dept: [phone]  and follow the prompts.  Office hours are 8:00 a.m. to 4:30 p.m. Monday - Friday. Please note that voicemails left after 4:00 p.m. may not be returned until the following business day.  We are closed weekends and major holidays. You have access to a nurse at all times for urgent questions. Please call the main number to the clinic Dept: [phone] and follow the prompts.   For any non-urgent questions, you may also contact your provider using MyChart. We now offer e-Visits for anyone 68 and older to request care online for non-urgent symptoms. For details visit [email or web address].   Also download the MyChart app! Go to the app store, search "MyChart", open the app, select [place], and log in with your MyChart username and password.

## 2023-05-05 ENCOUNTER — Ambulatory Visit (INDEPENDENT_AMBULATORY_CARE_PROVIDER_SITE_OTHER): Payer: 59 | Admitting: Family Medicine

## 2023-05-05 ENCOUNTER — Other Ambulatory Visit: Payer: Self-pay

## 2023-05-05 ENCOUNTER — Encounter: Payer: Self-pay | Admitting: Family Medicine

## 2023-05-05 ENCOUNTER — Other Ambulatory Visit (HOSPITAL_COMMUNITY): Payer: Self-pay

## 2023-05-05 VITALS — BP 157/63 | HR 76 | Ht 64.0 in

## 2023-05-05 DIAGNOSIS — I10 Essential (primary) hypertension: Secondary | ICD-10-CM

## 2023-05-05 DIAGNOSIS — E1149 Type 2 diabetes mellitus with other diabetic neurological complication: Secondary | ICD-10-CM

## 2023-05-05 LAB — POCT GLYCOSYLATED HEMOGLOBIN (HGB A1C): HbA1c, POC (controlled diabetic range): 6.4 % (ref 0.0–7.0)

## 2023-05-05 NOTE — Assessment & Plan Note (Signed)
Worry she is too tightly controlled given low blood sugars, symptoms and metastatic cancer.  Suggest decrease insulin and monitor blood sugar and symptoms

## 2023-05-05 NOTE — Assessment & Plan Note (Addendum)
Elevated in office but home readings are controlled.  Also having episode of lightheadness.  Given other comorbidities and cancer would not initiate medication treatment now but monitor

## 2023-05-05 NOTE — Patient Instructions (Signed)
Good to see you today - Thank you for coming in  Things we discussed today:  Diabetes  You have very good control probably too good I would consider cutting down on your lantus to 14-16 units a day Let me know if you blood sugars are regularly in the 60s that is too low  Your goal blood pressure is less than 135/85  Check your blood pressure several times a week.  If regularly higher than this please let me know - either with MyChart or leaving a phone message. Next visit please bring in your blood pressure cuff.     Please always bring your medication bottles  Come back to see me in 3-6 months

## 2023-05-05 NOTE — Progress Notes (Signed)
    SUBJECTIVE:   CHIEF COMPLAINT / HPI:   Diabetes On insulin Lantus 18 u and Lispro 8-10 u three times a day and Ozempic  Using CGM.  Sometimes is in the 50s-60s,   Often feels tired and sometimes like she is about to pass out  Hypertension No antihypertensives  Home readings - 130s/50-70s   PERTINENT  PMH / PSH:  Dr Al Pimple August 2024.   "Metastatic breast cancer: Most recent scan with pre vascular adenopathy increasing in size suggestive of progression. Since thi appears to be the only area for progression, we discussed that she could consider palliative radiation and continue herceptin maintenance.  She had palliative radiation, did not tolerate this well given the back pain and her positioning.  She most recently had imaging which showed response to therapy.  There appears to be no other disease or evidence of progression.  Once again she tells me that she is not very keen to do frequent scans, she says she does not want to do any aggressive treatments and if the disease continues to get worse she is okay with staying comfortable.   At this time there appears to be no active disease hence I think she will do well in the long run.  Will continue Herceptin and letrozole for now.  If she has any evidence of disease progression we will plan to switch to Kadcyla.  2.  Chronic back pain: Continue follow-up with palliative care."  OBJECTIVE:   BP (!) 157/63   Pulse 76   Ht 5\' 4"  (1.626 m)   SpO2 97%   BMI 52.59 kg/m   Alert interactive in [facility] Heart - Regular rate and rhythm.  No murmurs, gallops or rubs.    Lungs:  Normal respiratory effort, chest expands symmetrically. Lungs are clear to auscultation, no crackles or wheezes.   ASSESSMENT/PLAN:   Diabetes mellitus type 2 with neurological manifestations [facility]) Assessment & Plan: Worry she is too tightly controlled given low blood sugars, symptoms and metastatic cancer.  Suggest decrease insulin and monitor blood sugar and symptoms    Orders: -     POCT glycosylated hemoglobin (Hb A1C)  HYPERTENSION, BENIGN SYSTEMIC Assessment & Plan: Elevated in office but home readings are controlled.  Also having episode of lightheadness.  Given other comorbidities and cancer would not initiate medication treatment now but monitor      Patient Instructions  Good to see you today - Thank you for coming in  Things we discussed today:  Diabetes  You have very good control probably too good I would consider cutting down on your lantus to 14-16 units a day Let me know if you blood sugars are regularly in the 60s that is too low  Your goal blood pressure is less than 135/85  Check your blood pressure several times a week.  If regularly higher than this please let me know - either with MyChart or leaving a phone message. Next visit please bring in your blood pressure cuff.     Please always bring your medication bottles  Come back to see me in 3-6 months    [name], MD [facility]

## 2023-05-11 DIAGNOSIS — M25569 Pain in unspecified knee: Secondary | ICD-10-CM | POA: Diagnosis not present

## 2023-05-11 DIAGNOSIS — I5020 Unspecified systolic (congestive) heart failure: Secondary | ICD-10-CM | POA: Diagnosis not present

## 2023-05-11 DIAGNOSIS — U071 COVID-19: Secondary | ICD-10-CM | POA: Diagnosis not present

## 2023-05-11 DIAGNOSIS — R062 Wheezing: Secondary | ICD-10-CM | POA: Diagnosis not present

## 2023-05-12 ENCOUNTER — Other Ambulatory Visit: Payer: Self-pay | Admitting: Hematology and Oncology

## 2023-05-12 DIAGNOSIS — I5020 Unspecified systolic (congestive) heart failure: Secondary | ICD-10-CM | POA: Diagnosis not present

## 2023-05-12 DIAGNOSIS — U071 COVID-19: Secondary | ICD-10-CM | POA: Diagnosis not present

## 2023-05-12 DIAGNOSIS — R062 Wheezing: Secondary | ICD-10-CM | POA: Diagnosis not present

## 2023-05-12 DIAGNOSIS — M25569 Pain in unspecified knee: Secondary | ICD-10-CM | POA: Diagnosis not present

## 2023-05-21 ENCOUNTER — Other Ambulatory Visit: Payer: Self-pay

## 2023-05-21 DIAGNOSIS — G893 Neoplasm related pain (acute) (chronic): Secondary | ICD-10-CM

## 2023-05-21 DIAGNOSIS — Z515 Encounter for palliative care: Secondary | ICD-10-CM

## 2023-05-21 DIAGNOSIS — C50919 Malignant neoplasm of unspecified site of unspecified female breast: Secondary | ICD-10-CM

## 2023-05-21 MED ORDER — OXYCODONE HCL 10 MG PO TABS: 10.0000 mg | ORAL_TABLET | Freq: Four times a day (QID) | ORAL | 0 refills | Status: DC | PRN

## 2023-05-21 NOTE — Telephone Encounter (Signed)
Pt called for refills.

## 2023-05-22 ENCOUNTER — Other Ambulatory Visit: Payer: Self-pay | Admitting: Family Medicine

## 2023-05-22 DIAGNOSIS — Z1211 Encounter for screening for malignant neoplasm of colon: Secondary | ICD-10-CM

## 2023-05-22 DIAGNOSIS — Z1212 Encounter for screening for malignant neoplasm of rectum: Secondary | ICD-10-CM

## 2023-05-27 DIAGNOSIS — E1149 Type 2 diabetes mellitus with other diabetic neurological complication: Secondary | ICD-10-CM | POA: Diagnosis not present

## 2023-06-01 ENCOUNTER — Inpatient Hospital Stay: Payer: 59

## 2023-06-01 ENCOUNTER — Inpatient Hospital Stay: Payer: 59 | Admitting: Nurse Practitioner

## 2023-06-01 ENCOUNTER — Other Ambulatory Visit: Payer: Self-pay

## 2023-06-01 ENCOUNTER — Inpatient Hospital Stay: Payer: 59 | Admitting: Hematology and Oncology

## 2023-06-01 ENCOUNTER — Telehealth: Payer: Self-pay

## 2023-06-01 MED ORDER — DOXYCYCLINE HYCLATE 100 MG PO TABS: 100.0000 mg | ORAL_TABLET | Freq: Two times a day (BID) | ORAL | 0 refills | Status: DC

## 2023-06-01 NOTE — Progress Notes (Deleted)
Palliative Medicine [facility]  [phone]) [phone] [phone]   Name: [name] Date: 06/01/2023 MRN: [id number]  DOB: Jun 24, 1951  Patient Care Team: [name], MD as PCP - General (Family Medicine) Laurey Morale, MD as Consulting Physician (Cardiology) Rachel Moulds, MD as Consulting Physician (Hematology and Oncology)   INTERVAL HISTORY: [name] is a 72 y.o. female with  medical history including metastatic breast cancer with numerous pulmonary nodules bilaterally s/p chemotherapy currently on herceptin for maintenance. Now with complaints of uncontrolled back pain.  Palliative ask to see for symptom management.   SOCIAL HISTORY:     reports that she quit smoking about 54 years ago. Her smoking use included cigarettes. She started smoking about 59 years ago. She has a 15 pack-year smoking history. She has been exposed to tobacco smoke. She has never used smokeless tobacco. She reports current drug use. Drug: Marijuana. She reports that she does not drink alcohol.  ADVANCE DIRECTIVES:    CODE STATUS:   PAST MEDICAL HISTORY: Past Medical History:  Diagnosis Date   Arthritis    Back pain    Breast cancer (HCC) dx'd 11/2012   left   Chest pain    COPD (chronic obstructive pulmonary disease) (HCC)    Diabetes mellitus without complication (HCC) 09/28/2014   Ear pain    Fatty liver 6/03   Hypertension    Lung disease    Lung metastases dx'd 11/2012   Obesity, unspecified    Other abnormal glucose    Suicide attempt (HCC) 1996   Syncope and collapse    Unspecified sleep apnea     ALLERGIES:  is allergic to meperidine hcl, penicillins, and aspirin.  MEDICATIONS:  Current Outpatient Medications  Medication Sig Dispense Refill   albuterol (VENTOLIN HFA) 108 (90 Base) MCG/ACT inhaler INHALE 2 PUFFS INTO THE LUNGS EVERY 6 HOURS AS NEEDED FOR WHEEZE 8.5 each 1   Blood Glucose Monitoring Suppl (ONE TOUCH ULTRA 2)  w/Device KIT 1 kit by Does not apply route QID. ICD 10-code: E11.49. 1 kit 0   insulin glargine (LANTUS SOLOSTAR) 100 UNIT/ML Solostar Pen Inject 18 Units into the skin daily.     insulin lispro (HUMALOG KWIKPEN) 100 UNIT/ML KwikPen Inject 6-10 Units into the skin 3 (three) times daily. Inject 10 u subq three times a day with meals 15 mL 3   Insulin Pen Needle (PEN NEEDLES) 30G X 8 MM MISC Please use to inject insulin 4 times daily. 200 each 3   letrozole (FEMARA) 2.5 MG tablet TAKE 1 TABLET BY MOUTH EVERY DAY 90 tablet 12   loperamide (IMODIUM) 2 MG capsule TAKE 1 CAPSULE (2 MG TOTAL) BY MOUTH AS NEEDED FOR DIARRHEA OR LOOSE STOOLS. 30 capsule 1   ondansetron (ZOFRAN) 8 MG tablet Take 1 tablet (8 mg total) by mouth every 8 (eight) hours as needed. 20 tablet 2   ONETOUCH ULTRA test strip USE AS INSTRUCTED TO CHECK ONCE DAILY 100 strip 12   Oxycodone HCl 10 MG TABS Take 1 tablet (10 mg total) by mouth every 6 (six) hours as needed. 60 tablet 0   OXYGEN Inhale into the lungs.     Semaglutide, 2 MG/DOSE, (OZEMPIC, 2 MG/DOSE,) 8 MG/3ML SOPN Inject 2 mg into the skin once a week. 3 mL 6   No current facility-administered medications for this visit.    VITAL SIGNS: There were no vitals taken for this visit. There were no vitals filed for  this visit.  Estimated body mass index is 52.59 kg/m as calculated from the following:   Height as of 05/05/23: 5\' 4"  (1.626 m).   Weight as of 04/13/23: 306 lb 6.4 oz (139 kg).   PERFORMANCE STATUS (ECOG) : 1 - Symptomatic but completely ambulatory  Assessment NAD Normal breathing pattern with home oxygen RRR AAO x 4   IMPRESSION:   Neoplasm/Chronic/Neuropathic pain [name] reports pain is controlled on current regimen. Some days are better than others.  Tolerating Lyrica twice daily.  Has weaned down use of oxycodone.    We will continue to closely monitor and adjust medications accordingly.   Constipation Has been using Dulcolax with minimal  response.  Education provided on using  MiraLAX twice daily.  She verbalized understanding.  I discussed the importance of continued conversation with family and their medical providers regarding overall plan of care and treatment options, ensuring decisions are within the context of the patients values and GOCs.  PLAN: Oxy IR 10 mg as needed not requiring daily.  Last refill was in July. Lyrica 75mg  twice daily  Miralax twice daily for bowel regimen Ongoing goals of care discussions and support I will plan to see her back in the office in 3-4 weeks in collaboration with her other oncology appointments. Sooner if needed.    Patient expressed understanding and was in agreement with this plan. She also understands that She can call the clinic at any time with any questions, concerns, or complaints.    Any controlled substances utilized were prescribed in the context of palliative care. PDMP has been reviewed.    Visit consisted of counseling and education dealing with the complex and emotionally intense issues of symptom management and palliative care in the setting of serious and potentially life-threatening illness.Greater than 50%  of this time was spent counseling and coordinating care related to the above assessment and plan.  [name], AGPCNP-BC  Palliative Medicine Team/[facility]  *Please note that this is a verbal dictation therefore any spelling or grammatical errors are due to the "Dragon Medical One" system interpretation.

## 2023-06-01 NOTE — Telephone Encounter (Signed)
Pt called and states she needs to cancel her appts for today because she is unwell, stating she has a UTI and needs abx.  Pt reports fever of 101.0, chills and states she is taking tylenol to help control fever. She has also increased PO fluid intake.   Per MD, doxycycline 100 mg tab 1 tab BID x 5 days sent to preferred phx. Pt was encouraged to take medication with food and increase PO fluids. She verbalized thanks and understanding and knows to call if her fever does not subside.

## 2023-06-04 ENCOUNTER — Telehealth: Payer: Self-pay | Admitting: Hematology and Oncology

## 2023-06-04 NOTE — Telephone Encounter (Signed)
Pt wanted reschedule due to transportation issues. Advised pt we can provide transportation. Pt declined stating she does not have medicare to pay. Pt will f/u as scheduled.

## 2023-06-10 DIAGNOSIS — R062 Wheezing: Secondary | ICD-10-CM | POA: Diagnosis not present

## 2023-06-10 DIAGNOSIS — M25569 Pain in unspecified knee: Secondary | ICD-10-CM | POA: Diagnosis not present

## 2023-06-10 DIAGNOSIS — U071 COVID-19: Secondary | ICD-10-CM | POA: Diagnosis not present

## 2023-06-10 DIAGNOSIS — I5020 Unspecified systolic (congestive) heart failure: Secondary | ICD-10-CM | POA: Diagnosis not present

## 2023-06-11 DIAGNOSIS — M25569 Pain in unspecified knee: Secondary | ICD-10-CM | POA: Diagnosis not present

## 2023-06-11 DIAGNOSIS — R062 Wheezing: Secondary | ICD-10-CM | POA: Diagnosis not present

## 2023-06-11 DIAGNOSIS — I5020 Unspecified systolic (congestive) heart failure: Secondary | ICD-10-CM | POA: Diagnosis not present

## 2023-06-11 DIAGNOSIS — U071 COVID-19: Secondary | ICD-10-CM | POA: Diagnosis not present

## 2023-06-17 ENCOUNTER — Other Ambulatory Visit: Payer: Self-pay | Admitting: Family Medicine

## 2023-06-26 ENCOUNTER — Other Ambulatory Visit: Payer: Self-pay | Admitting: Nurse Practitioner

## 2023-06-26 DIAGNOSIS — C50919 Malignant neoplasm of unspecified site of unspecified female breast: Secondary | ICD-10-CM

## 2023-06-26 DIAGNOSIS — Z515 Encounter for palliative care: Secondary | ICD-10-CM

## 2023-06-26 DIAGNOSIS — G893 Neoplasm related pain (acute) (chronic): Secondary | ICD-10-CM

## 2023-06-27 DIAGNOSIS — E1149 Type 2 diabetes mellitus with other diabetic neurological complication: Secondary | ICD-10-CM | POA: Diagnosis not present

## 2023-06-29 ENCOUNTER — Encounter: Payer: Self-pay | Admitting: Adult Health

## 2023-06-29 ENCOUNTER — Ambulatory Visit (HOSPITAL_COMMUNITY)
Admission: RE | Admit: 2023-06-29 | Discharge: 2023-06-29 | Disposition: A | Discharge status: Home / Self Care | Payer: 59 | Source: Ambulatory Visit | Attending: Adult Health | Admitting: Adult Health

## 2023-06-29 ENCOUNTER — Encounter: Payer: Self-pay | Admitting: Nurse Practitioner

## 2023-06-29 ENCOUNTER — Inpatient Hospital Stay: Payer: 59

## 2023-06-29 ENCOUNTER — Telehealth: Payer: Self-pay | Admitting: *Deleted

## 2023-06-29 ENCOUNTER — Other Ambulatory Visit: Payer: Self-pay

## 2023-06-29 ENCOUNTER — Inpatient Hospital Stay: Payer: 59 | Attending: Oncology | Admitting: Adult Health

## 2023-06-29 ENCOUNTER — Inpatient Hospital Stay (HOSPITAL_BASED_OUTPATIENT_CLINIC_OR_DEPARTMENT_OTHER): Payer: 59 | Admitting: Nurse Practitioner

## 2023-06-29 VITALS — BP 149/66 | HR 69 | Temp 97.9°F | Resp 19 | Wt 302.8 lb

## 2023-06-29 DIAGNOSIS — Z87891 Personal history of nicotine dependence: Secondary | ICD-10-CM | POA: Insufficient documentation

## 2023-06-29 DIAGNOSIS — R530 Neoplastic (malignant) related fatigue: Secondary | ICD-10-CM

## 2023-06-29 DIAGNOSIS — R232 Flushing: Secondary | ICD-10-CM

## 2023-06-29 DIAGNOSIS — Z5112 Encounter for antineoplastic immunotherapy: Secondary | ICD-10-CM | POA: Diagnosis not present

## 2023-06-29 DIAGNOSIS — Z8719 Personal history of other diseases of the digestive system: Secondary | ICD-10-CM | POA: Diagnosis not present

## 2023-06-29 DIAGNOSIS — C50919 Malignant neoplasm of unspecified site of unspecified female breast: Secondary | ICD-10-CM | POA: Diagnosis not present

## 2023-06-29 DIAGNOSIS — Z170 Estrogen receptor positive status [ER+]: Secondary | ICD-10-CM | POA: Diagnosis not present

## 2023-06-29 DIAGNOSIS — K5900 Constipation, unspecified: Secondary | ICD-10-CM

## 2023-06-29 DIAGNOSIS — M549 Dorsalgia, unspecified: Secondary | ICD-10-CM | POA: Diagnosis not present

## 2023-06-29 DIAGNOSIS — K5903 Drug induced constipation: Secondary | ICD-10-CM | POA: Insufficient documentation

## 2023-06-29 DIAGNOSIS — Z8041 Family history of malignant neoplasm of ovary: Secondary | ICD-10-CM

## 2023-06-29 DIAGNOSIS — Z515 Encounter for palliative care: Secondary | ICD-10-CM | POA: Diagnosis not present

## 2023-06-29 DIAGNOSIS — Z79811 Long term (current) use of aromatase inhibitors: Secondary | ICD-10-CM | POA: Diagnosis not present

## 2023-06-29 DIAGNOSIS — I10 Essential (primary) hypertension: Secondary | ICD-10-CM | POA: Diagnosis not present

## 2023-06-29 DIAGNOSIS — C50212 Malignant neoplasm of upper-inner quadrant of left female breast: Secondary | ICD-10-CM | POA: Insufficient documentation

## 2023-06-29 DIAGNOSIS — C7800 Secondary malignant neoplasm of unspecified lung: Secondary | ICD-10-CM

## 2023-06-29 DIAGNOSIS — Z803 Family history of malignant neoplasm of breast: Secondary | ICD-10-CM | POA: Diagnosis not present

## 2023-06-29 DIAGNOSIS — F419 Anxiety disorder, unspecified: Secondary | ICD-10-CM

## 2023-06-29 DIAGNOSIS — G893 Neoplasm related pain (acute) (chronic): Secondary | ICD-10-CM | POA: Diagnosis not present

## 2023-06-29 MED ORDER — TRASTUZUMAB-ANNS CHEMO 150 MG IV SOLR
750.0000 mg | Freq: Once | INTRAVENOUS | Status: AC
Start: 2023-06-29 — End: 2023-06-29
  Administered 2023-06-29: 750 mg via INTRAVENOUS
  Filled 2023-06-29: qty 35.71

## 2023-06-29 MED ORDER — ACETAMINOPHEN 325 MG PO TABS
650.0000 mg | ORAL_TABLET | Freq: Once | ORAL | Status: AC
  Administered 2023-06-29: 650 mg via ORAL
  Filled 2023-06-29: qty 2

## 2023-06-29 MED ORDER — SODIUM CHLORIDE 0.9 % IV SOLN: Freq: Once | INTRAVENOUS | Status: AC

## 2023-06-29 MED ORDER — DIPHENHYDRAMINE HCL 25 MG PO CAPS
50.0000 mg | ORAL_CAPSULE | Freq: Once | ORAL | Status: AC
  Administered 2023-06-29: 50 mg via ORAL
  Filled 2023-06-29: qty 2

## 2023-06-29 NOTE — Progress Notes (Unsigned)
Palliative Medicine [facility]  Telephone:(336) [phone] Fax:(336) [phone]   Name: [name] Date: 06/29/2023 MRN: [id number]  DOB: July 23, 1951  Patient Care Team: [name], MD as PCP - General (Family Medicine) Laurey Morale, MD as Consulting Physician (Cardiology) Rachel Moulds, MD as Consulting Physician (Hematology and Oncology)   INTERVAL HISTORY: [name] is a 72 y.o. female with  medical history including metastatic breast cancer with numerous pulmonary nodules bilaterally s/p chemotherapy currently on herceptin for maintenance. Now with complaints of uncontrolled back pain.  Palliative ask to see for symptom management.   SOCIAL HISTORY:     reports that she quit smoking about 54 years ago. Her smoking use included cigarettes. She started smoking about 59 years ago. She has a 15 pack-year smoking history. She has been exposed to tobacco smoke. She has never used smokeless tobacco. She reports current drug use. Drug: Marijuana. She reports that she does not drink alcohol.  ADVANCE DIRECTIVES:    CODE STATUS:   PAST MEDICAL HISTORY: Past Medical History:  Diagnosis Date   Arthritis    Back pain    Breast cancer (HCC) dx'd 11/2012   left   Chest pain    COPD (chronic obstructive pulmonary disease) (HCC)    Diabetes mellitus without complication (HCC) 09/28/2014   Ear pain    Fatty liver 6/03   Hypertension    Lung disease    Lung metastases dx'd 11/2012   Obesity, unspecified    Other abnormal glucose    Suicide attempt (HCC) 1996   Syncope and collapse    Unspecified sleep apnea     ALLERGIES:  is allergic to meperidine hcl, penicillins, and aspirin.  MEDICATIONS:  Current Outpatient Medications  Medication Sig Dispense Refill   albuterol (VENTOLIN HFA) 108 (90 Base) MCG/ACT inhaler INHALE 2 PUFFS INTO THE LUNGS EVERY 6 HOURS AS NEEDED FOR WHEEZE 8.5 each 1   B-D ULTRAFINE III SHORT PEN 31G X 8 MM MISC PLEASE USE TO  INJECT INSULIN 4 TIMES DAILY. 200 each 3   Blood Glucose Monitoring Suppl (ONE TOUCH ULTRA 2) w/Device KIT 1 kit by Does not apply route QID. ICD 10-code: E11.49. 1 kit 0   doxycycline (VIBRA-TABS) 100 MG tablet Take 1 tablet (100 mg total) by mouth 2 (two) times daily. 10 tablet 0   insulin glargine (LANTUS SOLOSTAR) 100 UNIT/ML Solostar Pen Inject 18 Units into the skin daily.     insulin lispro (HUMALOG KWIKPEN) 100 UNIT/ML KwikPen Inject 6-10 Units into the skin 3 (three) times daily. Inject 10 u subq three times a day with meals 15 mL 3   Insulin Pen Needle (PEN NEEDLES) 30G X 8 MM MISC Please use to inject insulin 4 times daily. 200 each 3   letrozole (FEMARA) 2.5 MG tablet TAKE 1 TABLET BY MOUTH EVERY DAY 90 tablet 12   loperamide (IMODIUM) 2 MG capsule TAKE 1 CAPSULE (2 MG TOTAL) BY MOUTH AS NEEDED FOR DIARRHEA OR LOOSE STOOLS. 30 capsule 1   ondansetron (ZOFRAN) 8 MG tablet Take 1 tablet (8 mg total) by mouth every 8 (eight) hours as needed. 20 tablet 2   ONETOUCH ULTRA test strip USE AS INSTRUCTED TO CHECK ONCE DAILY 100 strip 12   Oxycodone HCl 10 MG TABS Take 1 tablet (10 mg total) by mouth every 6 (six) hours as needed. 60 tablet 0   OXYGEN Inhale into the lungs.     Semaglutide, 2 MG/DOSE, (OZEMPIC,  2 MG/DOSE,) 8 MG/3ML SOPN Inject 2 mg into the skin once a week. 3 mL 6   No current facility-administered medications for this visit.    VITAL SIGNS: There were no vitals taken for this visit. There were no vitals filed for this visit.  Estimated body mass index is 52.59 kg/m as calculated from the following:   Height as of 05/05/23: 5\' 4"  (1.626 m).   Weight as of 04/13/23: 306 lb 6.4 oz (139 kg).   PERFORMANCE STATUS (ECOG) : 1 - Symptomatic but completely ambulatory  Assessment NAD Normal breathing pattern with home oxygen RRR AAO x 4   IMPRESSION:   Neoplasm/Chronic/Neuropathic pain [name] reports pain is controlled on current regimen. Some days are better than  others.  Tolerating Lyrica twice daily.  Has weaned down use of oxycodone.    We will continue to closely monitor and adjust medications accordingly.   Constipation Has been using Dulcolax with minimal response.  Education provided on using  MiraLAX twice daily.  She verbalized understanding.  I discussed the importance of continued conversation with family and their medical providers regarding overall plan of care and treatment options, ensuring decisions are within the context of the patients values and GOCs.  PLAN: Oxy IR 10 mg as needed not requiring daily.  Last refill was in July. Lyrica 75mg  twice daily  Miralax twice daily for bowel regimen Ongoing goals of care discussions and support I will plan to see her back in the office in 3-4 weeks in collaboration with her other oncology appointments. Sooner if needed.    Patient expressed understanding and was in agreement with this plan. She also understands that She can call the clinic at any time with any questions, concerns, or complaints.    Any controlled substances utilized were prescribed in the context of palliative care. PDMP has been reviewed.    Visit consisted of counseling and education dealing with the complex and emotionally intense issues of symptom management and palliative care in the setting of serious and potentially life-threatening illness.Greater than 50%  of this time was spent counseling and coordinating care related to the above assessment and plan.  [name], AGPCNP-BC  Palliative Medicine Team/[facility]  *Please note that this is a verbal dictation therefore any spelling or grammatical errors are due to the "Dragon Medical One" system interpretation.

## 2023-06-29 NOTE — Patient Instructions (Signed)
[facility] - A DEPT OF [name]  Discharge Instructions: Thank you for choosing [facility] to provide your oncology and hematology care.   If you have a lab appointment with the Cancer Center, please go directly to the Cancer Center and check in at the registration area.   Wear comfortable clothing and clothing appropriate for easy access to any Portacath or PICC line.   We strive to give you quality time with your provider. You may need to reschedule your appointment if you arrive late (15 or more minutes).  Arriving late affects you and other patients whose appointments are after yours.  Also, if you miss three or more appointments without notifying the office, you may be dismissed from the clinic at the provider's discretion.      For prescription refill requests, have your pharmacy contact our office and allow 72 hours for refills to be completed.    Today you received the following chemotherapy and/or immunotherapy agents herceptin      To help prevent nausea and vomiting after your treatment, we encourage you to take your nausea medication as directed.  BELOW ARE SYMPTOMS THAT SHOULD BE REPORTED IMMEDIATELY: *FEVER GREATER THAN 100.4 F (38 C) OR HIGHER *CHILLS OR SWEATING *NAUSEA AND VOMITING THAT IS NOT CONTROLLED WITH YOUR NAUSEA MEDICATION *UNUSUAL SHORTNESS OF BREATH *UNUSUAL BRUISING OR BLEEDING *URINARY PROBLEMS (pain or burning when urinating, or frequent urination) *BOWEL PROBLEMS (unusual diarrhea, constipation, pain near the anus) TENDERNESS IN MOUTH AND THROAT WITH OR WITHOUT PRESENCE OF ULCERS (sore throat, sores in mouth, or a toothache) UNUSUAL RASH, SWELLING OR PAIN  UNUSUAL VAGINAL DISCHARGE OR ITCHING   Items with * indicate a potential emergency and should be followed up as soon as possible or go to the Emergency Department if any problems should occur.  Please show the CHEMOTHERAPY ALERT CARD or IMMUNOTHERAPY  ALERT CARD at check-in to the Emergency Department and triage nurse.  Should you have questions after your visit or need to cancel or reschedule your appointment, please contact [facility] - A DEPT OF [facility]  Dept: [phone]  and follow the prompts.  Office hours are 8:00 a.m. to 4:30 p.m. Monday - Friday. Please note that voicemails left after 4:00 p.m. may not be returned until the following business day.  We are closed weekends and major holidays. You have access to a nurse at all times for urgent questions. Please call the main number to the clinic Dept: [phone] and follow the prompts.   For any non-urgent questions, you may also contact your provider using MyChart. We now offer e-Visits for anyone 29 and older to request care online for non-urgent symptoms. For details visit [email or web address].   Also download the MyChart app! Go to the app store, search "MyChart", open the app, select [place], and log in with your MyChart username and password.

## 2023-06-29 NOTE — Assessment & Plan Note (Addendum)
[name] is a 72 year old woman with metastatic breast cancer to the lung noted in April 2014 (see oncologic history above).   Breast Cancer Patient is on Letrozole and Herceptin for breast cancer management. No new complaints related to this condition. -Recent CT 03/2023 shows no progression of her cancer. -Continue Letrozole/Herceptin as prescribed.  Constipation Severe constipation with straining and associated pain. Patient has been using Miralax inconsistently with variable results. Also taking oxycodone and Ozempic which can contribute to constipation. -Order abdominal x-ray to evaluate further. -Consider adjusting Miralax use and managing oxycodone use. -Discuss with palliative care NP today as well.   Hot Flashes Patient reports severe hot flashes, sweating, and associated nausea. Not clearly related to Letrozole use. -Advise patient to check blood sugar during hot flash episodes to assess for possible correlation. -Establish with new PCP at her PCP office -Consider further evaluation if symptoms persist.  Hypertension Patient reports high blood pressure readings at home, possibly related to pain. -Continue current management and monitor blood pressure.  General Health Maintenance / Followup Plans -Schedule echocardiogram in the infusion room as done previously. -Follow up in 3-4 weeks for next treatment.

## 2023-06-29 NOTE — Telephone Encounter (Signed)
This RN called to [employer] due to order for ECHO and need to obtain at next appt in bedroom in treatment area.  Obtained VM - detailed message left per above including this RN's name and returned.

## 2023-06-29 NOTE — Progress Notes (Signed)
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 [facility]) Staging form: Breast, AJCC 7th Edition - Clinical: Stage IV (TX, NX, M1) - Signed by Lowella Dell, MD on 04/17/2015  Malignant neoplasm of upper-inner quadrant of left breast in female, estrogen receptor positive (HCC) Staging form: Breast, AJCC 7th Edition - Clinical: Stage IA (T1c, N0, cM0) - Unsigned Specimen type: Core Needle Biopsy Histopathologic type: 9931 Laterality: Left Staging comments: Staged at breast conference 4.2.14  - Pathologic: Stage IV (M1) - Unsigned Specimen type: Core Needle Biopsy Histopathologic type: 9931 Laterality: Left   SUMMARY OF ONCOLOGIC HISTORY: 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 Darnelle Catalan     05/23/2013 Initial Diagnosis   Malignant neoplasm of upper-inner quadrant of left breast in female, estrogen receptor positive (HCC)     CURRENT THERAPY: herceptin/Letrozole  INTERVAL HISTORY:  Discussed the use of AI scribe software for clinical note transcription with the patient, who gave verbal consent to proceed.  [name] 72 y.o. female returns for f/u of her breast cancer on treatment with Letrozole.     She reports a history of chronic constipation, presents with worsening bowel irregularity and abdominal pain. She reports a pattern of hard stools followed by regular and then loose stools, which has now progressed to severe  constipation despite the use of Miralax. The patient describes significant straining during bowel movements, leading to abdominal pain and profuse sweating. She also reports a history of diverticulitis about 50 years ago.    The patient is on oxycodone for pain management, which she takes up to three times a day depending on the severity of her pain. She has noticed an increase in back pain associated with her abdominal discomfort. Despite increased water intake and use of Propel for hydration, she reports symptoms of dehydration such as dry, cracked lips.  In addition to her gastrointestinal symptoms, the patient reports severe hot flashes, which she describes as being different from the menopausal hot flashes she experienced in her late forties and fifties. These episodes are associated with profuse sweating, difficulty cooling down, and nausea to the point of feeling like she might pass out.  The patient is also on letrozole for breast cancer, which she reports is going well. She has been experiencing some fluctuations in her blood pressure, which she monitors at home.  She also reports that her blood sugar sometimes rises to around 200 after eating.  She has not checked her sugar during one of her recent hot flashes.    The patient expresses a feeling of being overwhelmed by her symptoms and a sense of not feeling well overall. She is currently without a primary care provider following the retirement of her previous doctor.  She has not called her PCP office about these issues.  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  MEDICAL HISTORY: Past Medical History:  Diagnosis Date   Arthritis    Back pain    Breast cancer (HCC) dx'd 11/2012   left   Chest pain    COPD (chronic obstructive pulmonary disease) (HCC)    Diabetes mellitus without complication (HCC) 09/28/2014   Ear pain    Fatty liver 6/03   Hypertension    Lung disease    Lung metastases dx'd 11/2012   Obesity, unspecified    Other abnormal glucose    Suicide attempt (HCC) 1996   Syncope and collapse    Unspecified sleep apnea     SURGICAL HISTORY: Past Surgical History:  Procedure Laterality Date   CARDIAC CATHETERIZATION     2007   CHOLECYSTECTOMY     TUBAL LIGATION      SOCIAL HISTORY: Social History   Socioeconomic History   Marital status: Divorced    Spouse name: Not on file   Number of children: 4   Years of education: 12   Highest education level: 12th grade  Occupational History   Occupation: Disability   Occupation: retired-daycare  Tobacco Use   Smoking status: Former    Current packs/day: 0.00    Average packs/day: 3.0 packs/day for 5.0 years (15.0 ttl pk-yrs)    Types: Cigarettes    Start date: 08/19/1963    Quit date: 08/18/1968    Years since quitting: 54.8    Passive exposure: Past   Smokeless tobacco: Never  Vaping Use   Vaping status: Never Used  Substance and Sexual Activity   Alcohol use: No    Alcohol/week: 0.0 standard drinks of alcohol   Drug use: Yes    Types: Marijuana    Comment: Medical Marijuana   Sexual activity: Not Currently    Partners: Male  Other Topics Concern   Not on file  Social History Narrative   Health Care POA:    Emergency Contact: Amador Cunas [phone]   End of Life Plan:    Who lives  with you: two grandchildren, friend, daughter   Any pets: 3 poodles   Diet: Pt has a variety of protein, starch, vegetables.  Pt is currently working on cutting back on portions for weight loss.   Exercise: Pt has not regular exercise routine.  Occasionally walks around home.   Seatbelts: Pt reports wearing seatbelt occasionally.   Sun Exposure/Protection: Pt does not use sun protection   Hobbies: reading, playing on kindle, ebay         Currently in her home she keeps her granddaughter [name], 16, who is the daughter of the patient's daughter Para March (the patient refers to [name] as "my adopted daughter"); grandson Nutile "[name]" [place], [place], who is [name]'s half-brother; daughter [name], and an [place] friend, [name], the patient's significant other. Daughter [name] is a Public librarian, currently unemployed. Son [name] works as an Personnel officer in [place]. Daughter Para March is currently in prison due  to killing someone in a car accident. Daughter [name] died from aplastic anemia at the age of 48. The patient has a total of 4 grandchildren. She is not a church attender   Social Determinants of Health   Financial Resource Strain: High Risk (05/02/2023)   Overall Financial Resource Strain (CARDIA)    Difficulty of Paying Living Expenses: Very hard  Food Insecurity: Food Insecurity Present (05/02/2023)   Hunger Vital Sign    Worried About Running Out of Food in the Last Year: Often true    Ran Out of Food in the Last Year: Often true  Transportation Needs: Unmet Transportation Needs (05/02/2023)   PRAPARE - Administrator, Civil Service (Medical): Yes    Lack of Transportation (Non-Medical): Yes  Physical Activity: Unknown (05/02/2023)   Exercise Vital Sign    Days of Exercise per Week: Patient declined    Minutes of Exercise per Session: 0 min  Stress: Stress Concern Present (05/02/2023)   [employer] of Occupational Health -  Occupational Stress Questionnaire    Feeling of Stress : To some extent  Social Connections: Socially Isolated (05/02/2023)   Social Connection and Isolation Panel [NHANES]    Frequency of Communication with Friends and Family: Twice a week    Frequency of Social Gatherings with Friends and Family: Never    Attends Religious Services: Never    Database administrator or Organizations: No    Attends Engineer, structural: More than 4 times per year    Marital Status: Widowed  Intimate Partner Violence: Not At Risk (07/30/2022)   Humiliation, Afraid, Rape, and Kick questionnaire    Fear of Current or Ex-Partner: No    Emotionally Abused: No    Physically Abused: No    Sexually Abused: No    FAMILY HISTORY: Family History  Problem Relation Age of Onset   Coronary artery disease Father 50   Diabetes Father    Heart disease Father    Breast cancer Mother 53   Cancer Mother 47       breast   Aplastic anemia Daughter        died at age 80   Cancer Maternal Aunt 90       ovarian   Cancer Maternal Grandmother 60       ovarian   Cancer Paternal Aunt 76       ovarian/breast/breast   Coronary artery disease Sister 42   Coronary artery disease Brother 16    Review of Systems  Constitutional:  Negative for appetite change, chills, fatigue, fever and unexpected weight change.  HENT:   Negative for hearing loss, lump/mass and trouble swallowing.   Eyes:  Negative for eye problems and icterus.  Respiratory:  Negative for chest tightness, cough and shortness of breath.   Cardiovascular:  Negative for chest pain, leg swelling and palpitations.  Gastrointestinal:  Positive for constipation. Negative for abdominal distention, abdominal pain, blood in stool, diarrhea, nausea, rectal pain and vomiting.  Endocrine: Positive for hot flashes.  Genitourinary:  Negative for difficulty urinating.   Musculoskeletal:  Negative for arthralgias.  Skin:  Negative for itching and rash.   Neurological:  Negative for dizziness, extremity weakness, headaches and numbness.  Hematological:  Negative for adenopathy. Does not bruise/bleed easily.  Psychiatric/Behavioral:  Negative for depression. The patient is not nervous/anxious.       PHYSICAL EXAMINATION    Vitals:   06/29/23 1406 06/29/23 1410  BP: (!) 148/108 (!) 149/66  Pulse:  75 69  Resp: 19   Temp: 97.9 F (36.6 C)   SpO2: 96%     Physical Exam Constitutional:      General: She is not in acute distress.    Appearance: Normal appearance. She is not toxic-appearing.     Comments: Examined in wheelchair  HENT:     Head: Normocephalic and atraumatic.     Mouth/Throat:     Mouth: Mucous membranes are moist.     Pharynx: Oropharynx is clear. No oropharyngeal exudate or posterior oropharyngeal erythema.  Eyes:     General: No scleral icterus. Cardiovascular:     Rate and Rhythm: Normal rate and regular rhythm.     Pulses: Normal pulses.     Heart sounds: Normal heart sounds.  Pulmonary:     Effort: Pulmonary effort is normal.     Breath sounds: Normal breath sounds.  Abdominal:     General: Abdomen is flat.     Palpations: Abdomen is soft.     Tenderness: There is no abdominal tenderness.     Comments: Limited exam due to body habitus and exam in wheelchair  Musculoskeletal:        General: No swelling.     Cervical back: Neck supple.  Lymphadenopathy:     Cervical: No cervical adenopathy.  Skin:    General: Skin is warm and dry.     Findings: No rash.  Neurological:     General: No focal deficit present.     Mental Status: She is alert.  Psychiatric:        Mood and Affect: Mood normal.        Behavior: Behavior normal.       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   Breast Cancer Patient is on Letrozole and Herceptin for breast  cancer management. No new complaints related to this condition. -Recent CT 03/2023 shows no progression of her cancer. -Continue Letrozole/Herceptin as prescribed.  Constipation Severe constipation with straining and associated pain. Patient has been using Miralax inconsistently with variable results. Also taking oxycodone and Ozempic which can contribute to constipation. -Order abdominal x-ray to evaluate further. -Consider adjusting Miralax use and managing oxycodone use. -Discuss with palliative care NP today as well.   Hot Flashes Patient reports severe hot flashes, sweating, and associated nausea. Not clearly related to Letrozole use. -Advise patient to check blood sugar during hot flash episodes to assess for possible correlation. -Establish with new PCP at her PCP office -Consider further evaluation if symptoms persist.  Hypertension Patient reports high blood pressure readings at home, possibly related to pain. -Continue current management and monitor blood pressure.  General Health Maintenance / Followup Plans -Schedule echocardiogram in the infusion room as done previously. -Follow up in 3-4 weeks for next treatment.   All questions were answered. The patient knows to call the clinic with any problems, questions or concerns. We can certainly see the patient much sooner if necessary.  Total encounter time:30 minutes*in face-to-face visit time, chart review, lab review, care coordination, order entry, and documentation of the encounter time.    Lillard Anes, NP 06/29/23 2:34 PM Medical Oncology and Hematology [facility] [address] [place], [place] [address] Tel. [phone]    Fax. [phone]  *Total Encounter Time as defined by the Centers for Medicare and Medicaid Services includes, in  addition to the face-to-face time of a patient visit (documented in the note above) non-face-to-face time: obtaining and reviewing outside history, ordering and  reviewing medications, tests or procedures, care coordination (communications with other health care professionals or caregivers) and documentation in the medical record.

## 2023-06-30 ENCOUNTER — Encounter: Payer: Self-pay | Admitting: Hematology and Oncology

## 2023-06-30 MED ORDER — POLYETHYLENE GLYCOL 3350 17 GM/SCOOP PO POWD: ORAL | Status: DC

## 2023-06-30 MED ORDER — DIAZEPAM 5 MG PO TABS: 5.0000 mg | ORAL_TABLET | Freq: Two times a day (BID) | ORAL | 0 refills | Status: DC | PRN

## 2023-06-30 MED ORDER — OXYCODONE HCL 10 MG PO TABS: 10.0000 mg | ORAL_TABLET | Freq: Four times a day (QID) | ORAL | 0 refills | Status: DC | PRN

## 2023-07-23 ENCOUNTER — Encounter: Payer: Self-pay | Admitting: Hematology and Oncology

## 2023-07-23 ENCOUNTER — Other Ambulatory Visit: Payer: Self-pay | Admitting: Family Medicine

## 2023-07-27 ENCOUNTER — Inpatient Hospital Stay: Payer: 59

## 2023-07-27 ENCOUNTER — Ambulatory Visit (HOSPITAL_BASED_OUTPATIENT_CLINIC_OR_DEPARTMENT_OTHER)
Admission: RE | Admit: 2023-07-27 | Discharge: 2023-07-27 | Disposition: A | Discharge status: Home / Self Care | Payer: 59 | Source: Ambulatory Visit | Attending: Adult Health | Admitting: Adult Health

## 2023-07-27 ENCOUNTER — Inpatient Hospital Stay: Payer: 59 | Attending: Oncology | Admitting: Hematology and Oncology

## 2023-07-27 VITALS — BP 163/77 | HR 88 | Resp 18

## 2023-07-27 VITALS — BP 152/52 | HR 71 | Temp 97.9°F | Resp 18 | Wt 303.4 lb

## 2023-07-27 DIAGNOSIS — E669 Obesity, unspecified: Secondary | ICD-10-CM | POA: Insufficient documentation

## 2023-07-27 DIAGNOSIS — C7800 Secondary malignant neoplasm of unspecified lung: Secondary | ICD-10-CM | POA: Diagnosis not present

## 2023-07-27 DIAGNOSIS — C50212 Malignant neoplasm of upper-inner quadrant of left female breast: Secondary | ICD-10-CM | POA: Insufficient documentation

## 2023-07-27 DIAGNOSIS — R079 Chest pain, unspecified: Secondary | ICD-10-CM | POA: Diagnosis not present

## 2023-07-27 DIAGNOSIS — R55 Syncope and collapse: Secondary | ICD-10-CM | POA: Diagnosis not present

## 2023-07-27 DIAGNOSIS — C50919 Malignant neoplasm of unspecified site of unspecified female breast: Secondary | ICD-10-CM

## 2023-07-27 DIAGNOSIS — I082 Rheumatic disorders of both aortic and tricuspid valves: Secondary | ICD-10-CM | POA: Insufficient documentation

## 2023-07-27 DIAGNOSIS — Z5112 Encounter for antineoplastic immunotherapy: Secondary | ICD-10-CM | POA: Diagnosis not present

## 2023-07-27 DIAGNOSIS — Z87891 Personal history of nicotine dependence: Secondary | ICD-10-CM | POA: Insufficient documentation

## 2023-07-27 DIAGNOSIS — Z79811 Long term (current) use of aromatase inhibitors: Secondary | ICD-10-CM | POA: Diagnosis not present

## 2023-07-27 DIAGNOSIS — K5900 Constipation, unspecified: Secondary | ICD-10-CM | POA: Diagnosis not present

## 2023-07-27 DIAGNOSIS — Z170 Estrogen receptor positive status [ER+]: Secondary | ICD-10-CM | POA: Insufficient documentation

## 2023-07-27 DIAGNOSIS — Z803 Family history of malignant neoplasm of breast: Secondary | ICD-10-CM | POA: Insufficient documentation

## 2023-07-27 DIAGNOSIS — Z8041 Family history of malignant neoplasm of ovary: Secondary | ICD-10-CM | POA: Diagnosis not present

## 2023-07-27 DIAGNOSIS — E1149 Type 2 diabetes mellitus with other diabetic neurological complication: Secondary | ICD-10-CM | POA: Diagnosis not present

## 2023-07-27 DIAGNOSIS — Z0189 Encounter for other specified special examinations: Secondary | ICD-10-CM

## 2023-07-27 DIAGNOSIS — J449 Chronic obstructive pulmonary disease, unspecified: Secondary | ICD-10-CM | POA: Insufficient documentation

## 2023-07-27 LAB — ECHOCARDIOGRAM COMPLETE
AR max vel: 2.46 cm2
AV Area VTI: 2.45 cm2
AV Area mean vel: 2.33 cm2
AV Mean grad: 4 mm[Hg]
AV Peak grad: 7.6 mm[Hg]
Ao pk vel: 1.38 m/s
Area-P 1/2: 3.3 cm2
Calc EF: 55.3 %
S' Lateral: 3.2 cm
Single Plane A2C EF: 57.2 %
Single Plane A4C EF: 59.2 %
Weight: 4854.4 [oz_av]

## 2023-07-27 MED ORDER — HEPARIN SOD (PORK) LOCK FLUSH 100 UNIT/ML IV SOLN
500.0000 [IU] | Freq: Once | INTRAVENOUS | Status: AC | PRN
  Administered 2023-07-27: 500 [IU]

## 2023-07-27 MED ORDER — TRASTUZUMAB-ANNS CHEMO 150 MG IV SOLR
750.0000 mg | Freq: Once | INTRAVENOUS | Status: AC
  Administered 2023-07-27: 750 mg via INTRAVENOUS
  Filled 2023-07-27: qty 35.71

## 2023-07-27 MED ORDER — SODIUM CHLORIDE 0.9% FLUSH
10.0000 mL | INTRAVENOUS | Status: DC | PRN
  Administered 2023-07-27: 10 mL

## 2023-07-27 MED ORDER — DIPHENHYDRAMINE HCL 25 MG PO CAPS
50.0000 mg | ORAL_CAPSULE | Freq: Once | ORAL | Status: AC
  Administered 2023-07-27: 50 mg via ORAL
  Filled 2023-07-27: qty 2

## 2023-07-27 MED ORDER — ACETAMINOPHEN 325 MG PO TABS
650.0000 mg | ORAL_TABLET | Freq: Once | ORAL | Status: AC
  Administered 2023-07-27: 650 mg via ORAL
  Filled 2023-07-27: qty 2

## 2023-07-27 MED ORDER — SODIUM CHLORIDE 0.9 % IV SOLN: Freq: Once | INTRAVENOUS | Status: AC

## 2023-07-27 MED ORDER — LORAZEPAM 2 MG/ML IJ SOLN
0.5000 mg | Freq: Once | INTRAMUSCULAR | Status: AC
  Administered 2023-07-27: 0.5 mg via INTRAVENOUS
  Filled 2023-07-27: qty 1

## 2023-07-27 NOTE — Patient Instructions (Signed)
[facility] - A DEPT OF [facility]  Discharge Instructions: Thank you for choosing [facility] to provide your oncology and hematology care.   If you have a lab appointment with the Cancer Center, please go directly to the Cancer Center and check in at the registration area.   Wear comfortable clothing and clothing appropriate for easy access to any Portacath or PICC line.   We strive to give you quality time with your provider. You may need to reschedule your appointment if you arrive late (15 or more minutes).  Arriving late affects you and other patients whose appointments are after yours.  Also, if you miss three or more appointments without notifying the office, you may be dismissed from the clinic at the provider's discretion.      For prescription refill requests, have your pharmacy contact our office and allow 72 hours for refills to be completed.    Today you received the following chemotherapy and/or immunotherapy agents kanjinti      To help prevent nausea and vomiting after your treatment, we encourage you to take your nausea medication as directed.  BELOW ARE SYMPTOMS THAT SHOULD BE REPORTED IMMEDIATELY: *FEVER GREATER THAN 100.4 F (38 C) OR HIGHER *CHILLS OR SWEATING *NAUSEA AND VOMITING THAT IS NOT CONTROLLED WITH YOUR NAUSEA MEDICATION *UNUSUAL SHORTNESS OF BREATH *UNUSUAL BRUISING OR BLEEDING *URINARY PROBLEMS (pain or burning when urinating, or frequent urination) *BOWEL PROBLEMS (unusual diarrhea, constipation, pain near the anus) TENDERNESS IN MOUTH AND THROAT WITH OR WITHOUT PRESENCE OF ULCERS (sore throat, sores in mouth, or a toothache) UNUSUAL RASH, SWELLING OR PAIN  UNUSUAL VAGINAL DISCHARGE OR ITCHING   Items with * indicate a potential emergency and should be followed up as soon as possible or go to the Emergency Department if any problems should occur.  Please show the CHEMOTHERAPY ALERT CARD or IMMUNOTHERAPY  ALERT CARD at check-in to the Emergency Department and triage nurse.  Should you have questions after your visit or need to cancel or reschedule your appointment, please contact [facility] - A DEPT OF [facility]  Dept: [phone]  and follow the prompts.  Office hours are 8:00 a.m. to 4:30 p.m. Monday - Friday. Please note that voicemails left after 4:00 p.m. may not be returned until the following business day.  We are closed weekends and major holidays. You have access to a nurse at all times for urgent questions. Please call the main number to the clinic Dept: [phone] and follow the prompts.   For any non-urgent questions, you may also contact your provider using MyChart. We now offer e-Visits for anyone 55 and older to request care online for non-urgent symptoms. For details visit [email or web address].   Also download the MyChart app! Go to the app store, search "MyChart", open the app, select [facility], and log in with your MyChart username and password.

## 2023-07-27 NOTE — Assessment & Plan Note (Signed)
[name] is a 72 year old woman with metastatic breast cancer to the lung noted in April 2014 (see oncologic history above).  Breast Cancer Patient is on Letrozole and Herceptin for breast cancer management. No new complaints related to this condition. -Recent CT 03/2023 shows no progression of her cancer. -Continue Letrozole/Herceptin as prescribed. -ECHO due today. No overt concern for cardiac compromise.  Constipation Severe constipation with straining and associated pain Improved.  Syncope Episode of syncope during a bowel movement last week. No recurrence since then. No other associated symptoms reported. -Continue Miralax as needed for constipation.  Chronic Obstructive Pulmonary Disease (COPD) Reports increased shortness of breath and decreased oxygen saturation levels. No change in cough. -Continue current inhaler regimen and follow up with respective teams.  Submandibular Salivary Gland Recurrent swelling and pain, possibly due to stone. -Continue current management with ice and lemon sucking.  Follow-up in 3 months. Continue monthly Herceptin infusions. Given her very limited mobility, she struggles to get imaging and requests [place] to consider imaging once a yr.

## 2023-07-27 NOTE — Progress Notes (Signed)
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 [facility]) Staging form: Breast, AJCC 7th Edition - Clinical: Stage IV (TX, NX, M1) - Signed by Lowella Dell, MD on 04/17/2015  Malignant neoplasm of upper-inner quadrant of left breast in female, estrogen receptor positive (HCC) Staging form: Breast, AJCC 7th Edition - Clinical: Stage IA (T1c, N0, cM0) - Unsigned Specimen type: Core Needle Biopsy Histopathologic type: 9931 Laterality: Left Staging comments: Staged at breast conference 4.2.14  - Pathologic: Stage IV (M1) - Unsigned Specimen type: Core Needle Biopsy Histopathologic type: 9931 Laterality: Left   SUMMARY OF ONCOLOGIC HISTORY: 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 Darnelle Catalan     05/23/2013 Initial Diagnosis   Malignant neoplasm of upper-inner quadrant of left breast in female, estrogen receptor positive (HCC)     CURRENT THERAPY: herceptin/Letrozole  INTERVAL HISTORY:  Discussed the use of AI scribe software for clinical note transcription with the patient, who gave verbal consent to proceed.  [name] 72 y.o. female returns for f/u of her breast cancer on treatment with Letrozole.     The patient, with a history of breast cancer, COPD, and constipation, presents after a syncopal episode last week. She was straining to have a bowel movement and passed out, waking up leaning against the bathroom shelf. She was  weak and slept for the remainder of the day. She has been taking Miralax for constipation, but has to stop when she develops diarrhea. She reports difficulty with bowel movements, feeling like her lower muscles are weak or gone.  She also reports chest pain that comes and goes, and constant shortness of breath. She has noticed that her oxygen saturation has been dropping to around 89-90, down from her usual 94-95. She has been using her asthma inhaler more frequently due to increased shortness of breath, especially when talking.  Additionally, she has a swollen submandibular salivary gland, which she has been managing with ice. She reports that it gets infected frequently and causes pain when she tries to chew.  She denies any overt chest pain, chest pressure. Rest of the pertinent 10 point ROS reviewed and neg.  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  MEDICAL HISTORY: Past Medical History:  Diagnosis Date   Arthritis    Back pain    Breast cancer (HCC) dx'd 11/2012  left   Chest pain    COPD (chronic obstructive pulmonary disease) (HCC)    Diabetes mellitus without complication (HCC) 09/28/2014   Ear pain    Fatty liver 6/03   Hypertension    Lung disease    Lung metastases dx'd 11/2012   Obesity, unspecified    Other abnormal glucose    Suicide attempt (HCC) 1996    Syncope and collapse    Unspecified sleep apnea     SURGICAL HISTORY: Past Surgical History:  Procedure Laterality Date   CARDIAC CATHETERIZATION     2007   CHOLECYSTECTOMY     TUBAL LIGATION      SOCIAL HISTORY: Social History   Socioeconomic History   Marital status: Divorced    Spouse name: Not on file   Number of children: 4   Years of education: 12   Highest education level: 12th grade  Occupational History   Occupation: Disability   Occupation: retired-daycare  Tobacco Use   Smoking status: Former    Current packs/day: 0.00    Average packs/day: 3.0 packs/day for 5.0 years (15.0 ttl pk-yrs)    Types: Cigarettes    Start date: 08/19/1963    Quit date: 08/18/1968    Years since quitting: 54.9    Passive exposure: Past   Smokeless tobacco: Never  Vaping Use   Vaping status: Never Used  Substance and Sexual Activity   Alcohol use: No    Alcohol/week: 0.0 standard drinks of alcohol   Drug use: Yes    Types: Marijuana    Comment: Medical Marijuana   Sexual activity: Not Currently    Partners: Male  Other Topics Concern   Not on file  Social History Narrative   Health Care POA:    Emergency Contact: Amador Cunas [phone]   End of Life Plan:    Who lives with you: two grandchildren, friend, daughter   Any pets: 3 poodles   Diet: Pt has a variety of protein, starch, vegetables.  Pt is currently working on cutting back on portions for weight loss.   Exercise: Pt has not regular exercise routine.  Occasionally walks around home.   Seatbelts: Pt reports wearing seatbelt occasionally.   Sun Exposure/Protection: Pt does not use sun protection   Hobbies: reading, playing on kindle, ebay         Currently in her home she keeps her granddaughter [name], 16, who is the daughter of the patient's daughter Para March (the patient refers to [name] as "my adopted daughter"); grandson Banter "[name]" [place], [place], who is [name]'s half-brother; daughter [name], and an  [place] friend, [name], the patient's significant other. Daughter [name] is a Public librarian, currently unemployed. Son [name] works as an Personnel officer in [place]. Daughter Para March is currently in prison due to killing someone in a car accident. Daughter [name] died from aplastic anemia at the age of 95. The patient has a total of 4 grandchildren. She is not a church attender   Social Determinants of Health   Financial Resource Strain: High Risk (05/02/2023)   Overall Financial Resource Strain (CARDIA)    Difficulty of Paying Moss Expenses: Very hard  Food Insecurity: Food Insecurity Present (05/02/2023)   Hunger Vital Sign    Worried About Running Out of Food in the Last Year: Often true    Ran Out of Food in the Last Year: Often true  Transportation Needs: Unmet Transportation Needs (05/02/2023)   PRAPARE - Administrator, Civil Service (  Medical): Yes    Lack of Transportation (Non-Medical): Yes  Physical Activity: Unknown (05/02/2023)   Exercise Vital Sign    Days of Exercise per Week: Patient declined    Minutes of Exercise per Session: 0 min  Stress: Stress Concern Present (05/02/2023)   [employer] of Occupational Health - Occupational Stress Questionnaire    Feeling of Stress : To some extent  Social Connections: Socially Isolated (05/02/2023)   Social Connection and Isolation Panel [NHANES]    Frequency of Communication with Friends and Family: Twice a week    Frequency of Social Gatherings with Friends and Family: Never    Attends Religious Services: Never    Database administrator or Organizations: No    Attends Engineer, structural: More than 4 times per year    Marital Status: Widowed  Intimate Partner Violence: Not At Risk (07/30/2022)   Humiliation, Afraid, Rape, and Kick questionnaire    Fear of Current or Ex-Partner: No    Emotionally Abused: No    Physically Abused: No    Sexually Abused: No    FAMILY  HISTORY: Family History  Problem Relation Age of Onset   Coronary artery disease Father 72   Diabetes Father    Heart disease Father    Breast cancer Mother 41   Cancer Mother 48       breast   Aplastic anemia Daughter        died at age 1   Cancer Maternal Aunt 29       ovarian   Cancer Maternal Grandmother 51       ovarian   Cancer Paternal Aunt 24       ovarian/breast/breast   Coronary artery disease Sister 4   Coronary artery disease Brother 3    Review of Systems  Constitutional:  Negative for appetite change, chills, fatigue, fever and unexpected weight change.  HENT:   Negative for hearing loss, lump/mass and trouble swallowing.   Eyes:  Negative for eye problems and icterus.  Respiratory:  Negative for chest tightness, cough and shortness of breath.   Cardiovascular:  Negative for chest pain, leg swelling and palpitations.  Gastrointestinal:  Positive for constipation. Negative for abdominal distention, abdominal pain, blood in stool, diarrhea, nausea, rectal pain and vomiting.  Endocrine: Positive for hot flashes.  Genitourinary:  Negative for difficulty urinating.   Musculoskeletal:  Negative for arthralgias.  Skin:  Negative for itching and rash.  Neurological:  Negative for dizziness, extremity weakness, headaches and numbness.  Hematological:  Negative for adenopathy. Does not bruise/bleed easily.  Psychiatric/Behavioral:  Negative for depression. The patient is not nervous/anxious.      PHYSICAL EXAMINATION    Vitals:   07/27/23 1334  BP: (!) 152/52  Pulse: 71  Resp: 18  Temp: 97.9 F (36.6 C)  SpO2: 99%    Physical Exam Constitutional:      General: She is not in acute distress.    Appearance: Normal appearance. She is not toxic-appearing.     Comments: Examined in wheelchair  HENT:     Head: Normocephalic and atraumatic.     Mouth/Throat:     Mouth: Mucous membranes are moist.     Pharynx: Oropharynx is clear. No oropharyngeal exudate or  posterior oropharyngeal erythema.  Eyes:     General: No scleral icterus. Cardiovascular:     Rate and Rhythm: Normal rate and regular rhythm.     Pulses: Normal pulses.     Heart sounds: Normal heart  sounds.  Pulmonary:     Effort: Pulmonary effort is normal.     Breath sounds: Normal breath sounds.  Abdominal:     General: Abdomen is flat.     Palpations: Abdomen is soft.     Tenderness: There is no abdominal tenderness.     Comments: Limited exam due to body habitus and exam in wheelchair  Musculoskeletal:        General: Swelling (Bilateral LE swelling symmetric) present.     Cervical back: Neck supple.  Lymphadenopathy:     Cervical: No cervical adenopathy.  Skin:    General: Skin is warm and dry.     Findings: No rash.  Neurological:     General: No focal deficit present.     Mental Status: She is alert.  Psychiatric:        Mood and Affect: Mood normal.        Behavior: Behavior normal.       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  Breast Cancer Patient is on Letrozole and Herceptin for breast cancer management. No new complaints related to this condition. -Recent CT 03/2023 shows no progression of her cancer. -Continue Letrozole/Herceptin as prescribed. -ECHO due today. No overt concern for cardiac compromise.  Constipation Severe constipation with straining and associated pain Improved.  Syncope Episode of syncope during a bowel movement last week. No recurrence since then. No other associated symptoms reported. -Continue Miralax as needed for constipation.  Chronic Obstructive Pulmonary Disease (COPD) Reports increased shortness of breath and decreased oxygen saturation levels. No change in cough. -Continue current inhaler regimen and follow up with respective teams.  Submandibular Salivary  Gland Recurrent swelling and pain, possibly due to stone. -Continue current management with ice and lemon sucking.  Follow-up in 3 months. Continue monthly Herceptin infusions. Given her very limited mobility, she struggles to get imaging and requests [place] to consider imaging once a yr.    All questions were answered. The patient knows to call the clinic with any problems, questions or concerns. We can certainly see the patient much sooner if necessary.  Total encounter time:30 minutes*in face-to-face visit time, chart review, lab review, care coordination, order entry, and documentation of the encounter time.  *Total Encounter Time as defined by the Centers for Medicare and Medicaid Services includes, in addition to the face-to-face time of a patient visit (documented in the note above) non-face-to-face time: obtaining and reviewing outside history, ordering and reviewing medications, tests or procedures, care coordination (communications with other health care professionals or caregivers) and documentation in the medical record.

## 2023-07-29 ENCOUNTER — Other Ambulatory Visit: Payer: Self-pay | Admitting: Family Medicine

## 2023-07-29 DIAGNOSIS — E1149 Type 2 diabetes mellitus with other diabetic neurological complication: Secondary | ICD-10-CM

## 2023-07-30 ENCOUNTER — Other Ambulatory Visit: Payer: Self-pay

## 2023-07-30 ENCOUNTER — Other Ambulatory Visit (HOSPITAL_COMMUNITY): Payer: Self-pay

## 2023-07-30 ENCOUNTER — Other Ambulatory Visit: Payer: Self-pay | Admitting: Nurse Practitioner

## 2023-07-30 ENCOUNTER — Inpatient Hospital Stay (HOSPITAL_BASED_OUTPATIENT_CLINIC_OR_DEPARTMENT_OTHER): Payer: 59 | Admitting: Nurse Practitioner

## 2023-07-30 ENCOUNTER — Encounter: Payer: Self-pay | Admitting: Nurse Practitioner

## 2023-07-30 DIAGNOSIS — G893 Neoplasm related pain (acute) (chronic): Secondary | ICD-10-CM

## 2023-07-30 DIAGNOSIS — K5903 Drug induced constipation: Secondary | ICD-10-CM

## 2023-07-30 DIAGNOSIS — Z515 Encounter for palliative care: Secondary | ICD-10-CM

## 2023-07-30 DIAGNOSIS — F419 Anxiety disorder, unspecified: Secondary | ICD-10-CM | POA: Diagnosis not present

## 2023-07-30 DIAGNOSIS — C7800 Secondary malignant neoplasm of unspecified lung: Secondary | ICD-10-CM

## 2023-07-30 DIAGNOSIS — C50919 Malignant neoplasm of unspecified site of unspecified female breast: Secondary | ICD-10-CM | POA: Diagnosis not present

## 2023-07-30 MED ORDER — POLYETHYLENE GLYCOL 3350 17 GM/SCOOP PO POWD: ORAL | Status: DC

## 2023-07-30 MED ORDER — OXYCODONE HCL 10 MG PO TABS: 10.0000 mg | ORAL_TABLET | Freq: Four times a day (QID) | ORAL | 0 refills | Status: DC | PRN

## 2023-07-30 MED ORDER — OZEMPIC (2 MG/DOSE) 8 MG/3ML ~~LOC~~ SOPN
2.0000 mg | PEN_INJECTOR | SUBCUTANEOUS | 1 refills | Status: DC
  Filled 2023-07-30: qty 3, 28d supply, fill #0
  Filled 2023-07-30 – 2023-08-22 (×2): qty 3, 28d supply, fill #1

## 2023-07-30 MED ORDER — DIAZEPAM 5 MG PO TABS: 5.0000 mg | ORAL_TABLET | Freq: Two times a day (BID) | ORAL | 0 refills | Status: DC | PRN

## 2023-07-30 NOTE — Progress Notes (Signed)
Palliative Medicine [facility]  Telephone:([phone] Fax:([phone]) [phone]   Name: [name] Date: 07/30/2023 MRN: [id number]  DOB: 14-May-1951  Patient Care Team: [name], MD as PCP - General (Family Medicine) Laurey Morale, MD as Consulting Physician (Cardiology) Rachel Moulds, MD as Consulting Physician (Hematology and Oncology)   I connected with [name] on 07/30/23 at  2:30 PM EST by phone and verified that I am speaking with the correct person using two identifiers.   I discussed the limitations, risks, security and privacy concerns of performing an evaluation and management service by telemedicine and the availability of in-person appointments. I also discussed with the patient that there may be a patient responsible charge related to this service. The patient expressed understanding and agreed to proceed.   Other persons participating in the visit and their role in the encounter: n/a   Patient's location: home  Provider's location: [facility]   Chief Complaint: f/u of symptom management   INTERVAL HISTORY: [name] is a 72 y.o. female with  medical history including metastatic breast cancer with numerous pulmonary nodules bilaterally s/p chemotherapy currently on herceptin for maintenance. Now with complaints of uncontrolled back pain.  Palliative ask to see for symptom management.   SOCIAL HISTORY:     reports that she quit smoking about 54 years ago. Her smoking use included cigarettes. She started smoking about 59 years ago. She has a 15 pack-year smoking history. She has been exposed to tobacco smoke. She has never used smokeless tobacco. She reports current drug use. Drug: Marijuana. She reports that she does not drink alcohol.  ADVANCE DIRECTIVES:    CODE STATUS:   PAST MEDICAL HISTORY: Past Medical History:  Diagnosis Date   Arthritis    Back pain    Breast cancer (HCC) dx'd 11/2012   left   Chest pain    COPD  (chronic obstructive pulmonary disease) (HCC)    Diabetes mellitus without complication (HCC) 09/28/2014   Ear pain    Fatty liver 6/03   Hypertension    Lung disease    Lung metastases dx'd 11/2012   Obesity, unspecified    Other abnormal glucose    Suicide attempt (HCC) 1996   Syncope and collapse    Unspecified sleep apnea     ALLERGIES:  is allergic to meperidine hcl, penicillins, and aspirin.  MEDICATIONS:  Current Outpatient Medications  Medication Sig Dispense Refill   albuterol (VENTOLIN HFA) 108 (90 Base) MCG/ACT inhaler INHALE 2 PUFFS INTO THE LUNGS EVERY 6 HOURS AS NEEDED FOR WHEEZE 8.5 each 1   B-D ULTRAFINE III SHORT PEN 31G X 8 MM MISC PLEASE USE TO INJECT INSULIN 4 TIMES DAILY. 200 each 3   Blood Glucose Monitoring Suppl (ONE TOUCH ULTRA 2) w/Device KIT 1 kit by Does not apply route QID. ICD 10-code: E11.49. 1 kit 0   diazepam (VALIUM) 5 MG tablet Take 1 tablet (5 mg total) by mouth every 12 (twelve) hours as needed for anxiety. 30 tablet 0   insulin glargine (LANTUS SOLOSTAR) 100 UNIT/ML Solostar Pen Inject 18 Units into the skin daily.     insulin lispro (HUMALOG KWIKPEN) 100 UNIT/ML KwikPen Inject 6-10 Units into the skin 3 (three) times daily. Inject 10 u subq three times a day with meals 15 mL 3   Insulin Pen Needle (PEN NEEDLES) 30G X 8 MM MISC Please use to inject insulin 4 times daily. 200 each 3   letrozole (  FEMARA) 2.5 MG tablet TAKE 1 TABLET BY MOUTH EVERY DAY 90 tablet 12   loperamide (IMODIUM) 2 MG capsule TAKE 1 CAPSULE (2 MG TOTAL) BY MOUTH AS NEEDED FOR DIARRHEA OR LOOSE STOOLS. 30 capsule 1   ondansetron (ZOFRAN) 8 MG tablet Take 1 tablet (8 mg total) by mouth every 8 (eight) hours as needed. 20 tablet 2   ONETOUCH ULTRA test strip USE AS INSTRUCTED TO CHECK ONCE DAILY 100 strip 12   Oxycodone HCl 10 MG TABS Take 1 tablet (10 mg total) by mouth every 6 (six) hours as needed. 60 tablet 0   OXYGEN Inhale into the lungs.     polyethylene glycol powder  (MIRALAX) 17 GM/SCOOP powder Take 1 capful twice daily. 238 g    Semaglutide, 2 MG/DOSE, (OZEMPIC, 2 MG/DOSE,) 8 MG/3ML SOPN Inject 2 mg into the skin once a week. 3 mL 6   No current facility-administered medications for this visit.    VITAL SIGNS: There were no vitals taken for this visit. There were no vitals filed for this visit.  Estimated body mass index is 52.08 kg/m as calculated from the following:   Height as of 05/05/23: 5\' 4"  (1.626 m).   Weight as of 07/27/23: 303 lb 6.4 oz (137.6 kg).   PERFORMANCE STATUS (ECOG) : 1 - Symptomatic but completely ambulatory    IMPRESSION: I connected by phone with [name]. No acute distress noted. Continues to take things one day at a time. Denies nausea or vomiting. Appetite fluctuates. Some days are better than others. She is planning to follow-up with PCP for follow-up on her diabetic medications, specifically her Ozempic.    Neoplasm/Chronic/Neuropathic pain Patient reports her pain is well controlled on current regimen. Tolerating medications without difficulty. Taking Oxycodone 10mg  every 6 hours as needed as prescribed. Refills are appropriate. No adjustments to medications at this time. Valium as needed for anxiety/sleep.    We will continue to closely monitor and adjust medications accordingly.   Constipation [name] endorses ongoing constipation. States she has been taking the Miralax daily with some success however some days still encounters constipation. Emphasize taking medication daily and if still concerns with constipation to increase to twice daily. Refill sent to pharmacy as requested. If she increases to twice daily without improvement she will need to continue and also add in senna-S 2 tablets at bedtime.   I discussed the importance of continued conversation with family and their medical providers regarding overall plan of care and treatment options, ensuring decisions are within the context of the patients values and  GOCs.  PLAN: Oxy IR 10 mg as needed not requiring daily.    Miralax twice daily for bowel regimen Ongoing symptom management support as needed.  I will plan to see her back in the office or telephone visit in 4-6 weeks in collaboration with her other oncology appointments. Sooner if needed.    Patient expressed understanding and was in agreement with this plan. She also understands that She can call the clinic at any time with any questions, concerns, or complaints.    Any controlled substances utilized were prescribed in the context of palliative care. PDMP has been reviewed.    I provided 30 minutes of non face-to-face telephone visit time during this encounter, and > 50% was spent counseling as documented under my assessment & plan. Visit consisted of counseling and education dealing with the complex and emotionally intense issues of symptom management and palliative care in the setting of serious  and potentially life-threatening illness.  [name], AGPCNP-BC  Palliative Medicine Team/[facility]

## 2023-07-31 ENCOUNTER — Encounter: Payer: Self-pay | Admitting: Hematology and Oncology

## 2023-08-04 ENCOUNTER — Telehealth: Payer: Self-pay | Admitting: *Deleted

## 2023-08-04 ENCOUNTER — Encounter: Payer: Self-pay | Admitting: *Deleted

## 2023-08-04 NOTE — Telephone Encounter (Signed)
This RN was transferred call from Palliative left by the pt.  Fox states continued pain in right ear- note at last visit it was more in the salivary gland.   " But now in my ear and quite painful- I have this thing you put on your phone that lets you look in your ear - and it is quite red"  She is asking for ear drops.  This RN attempted to call pt per above to discuss further including need to see or discuss with primary MD- Other choice would be a virtual visit with the [facility].  This RN attempted to call pt to discuss - obtained VM verifying number but stating mail box is full.  Pt is My Chart active and will note per above for pt to hopefully be assessed per virtual visit.

## 2023-08-14 ENCOUNTER — Encounter: Payer: Self-pay | Admitting: Hematology and Oncology

## 2023-08-22 ENCOUNTER — Other Ambulatory Visit (HOSPITAL_COMMUNITY): Payer: Self-pay

## 2023-08-24 ENCOUNTER — Other Ambulatory Visit: Payer: Self-pay | Admitting: Nurse Practitioner

## 2023-08-24 ENCOUNTER — Other Ambulatory Visit: Payer: Self-pay | Admitting: *Deleted

## 2023-08-24 DIAGNOSIS — C50919 Malignant neoplasm of unspecified site of unspecified female breast: Secondary | ICD-10-CM

## 2023-08-24 DIAGNOSIS — K5903 Drug induced constipation: Secondary | ICD-10-CM

## 2023-08-24 MED ORDER — POLYETHYLENE GLYCOL 3350 17 GM/SCOOP PO POWD
ORAL | 6 refills | Status: DC
  Filled 2023-08-24: qty 238, 8d supply, fill #0

## 2023-08-25 ENCOUNTER — Encounter: Payer: Self-pay | Admitting: Pharmacist

## 2023-08-25 ENCOUNTER — Inpatient Hospital Stay: Payer: 59 | Admitting: Hematology and Oncology

## 2023-08-25 ENCOUNTER — Encounter: Payer: Self-pay | Admitting: Hematology and Oncology

## 2023-08-25 ENCOUNTER — Inpatient Hospital Stay: Payer: 59

## 2023-08-25 ENCOUNTER — Other Ambulatory Visit: Payer: Self-pay

## 2023-08-25 ENCOUNTER — Other Ambulatory Visit (HOSPITAL_COMMUNITY): Payer: Self-pay

## 2023-08-25 ENCOUNTER — Inpatient Hospital Stay: Payer: 59 | Attending: Oncology

## 2023-08-25 VITALS — BP 174/56 | HR 74 | Temp 98.3°F | Resp 16 | Wt 304.9 lb

## 2023-08-25 VITALS — BP 151/62 | HR 61 | Resp 16

## 2023-08-25 DIAGNOSIS — C50919 Malignant neoplasm of unspecified site of unspecified female breast: Secondary | ICD-10-CM

## 2023-08-25 DIAGNOSIS — Z170 Estrogen receptor positive status [ER+]: Secondary | ICD-10-CM | POA: Insufficient documentation

## 2023-08-25 DIAGNOSIS — C7800 Secondary malignant neoplasm of unspecified lung: Secondary | ICD-10-CM | POA: Diagnosis not present

## 2023-08-25 DIAGNOSIS — Z5112 Encounter for antineoplastic immunotherapy: Secondary | ICD-10-CM | POA: Insufficient documentation

## 2023-08-25 DIAGNOSIS — Z79811 Long term (current) use of aromatase inhibitors: Secondary | ICD-10-CM | POA: Insufficient documentation

## 2023-08-25 DIAGNOSIS — C50212 Malignant neoplasm of upper-inner quadrant of left female breast: Secondary | ICD-10-CM | POA: Insufficient documentation

## 2023-08-25 DIAGNOSIS — Z95828 Presence of other vascular implants and grafts: Secondary | ICD-10-CM

## 2023-08-25 DIAGNOSIS — Z87891 Personal history of nicotine dependence: Secondary | ICD-10-CM | POA: Insufficient documentation

## 2023-08-25 LAB — CBC WITH DIFFERENTIAL (CANCER CENTER ONLY)
Abs Immature Granulocytes: 0.05 10*3/uL (ref 0.00–0.07)
Basophils Absolute: 0 10*3/uL (ref 0.0–0.1)
Basophils Relative: 1 %
Eosinophils Absolute: 0.1 10*3/uL (ref 0.0–0.5)
Eosinophils Relative: 2 %
HCT: 41.8 % (ref 36.0–46.0)
Hemoglobin: 14 g/dL (ref 12.0–15.0)
Immature Granulocytes: 1 %
Lymphocytes Relative: 28 %
Lymphs Abs: 1.8 10*3/uL (ref 0.7–4.0)
MCH: 29.4 pg (ref 26.0–34.0)
MCHC: 33.5 g/dL (ref 30.0–36.0)
MCV: 87.6 fL (ref 80.0–100.0)
Monocytes Absolute: 0.5 10*3/uL (ref 0.1–1.0)
Monocytes Relative: 8 %
Neutro Abs: 4.1 10*3/uL (ref 1.7–7.7)
Neutrophils Relative %: 60 %
Platelet Count: 209 10*3/uL (ref 150–400)
RBC: 4.77 MIL/uL (ref 3.87–5.11)
RDW: 13.8 % (ref 11.5–15.5)
WBC Count: 6.7 10*3/uL (ref 4.0–10.5)
nRBC: 0 % (ref 0.0–0.2)

## 2023-08-25 LAB — CMP (CANCER CENTER ONLY)
ALT: 58 U/L — ABNORMAL HIGH (ref 0–44)
AST: 47 U/L — ABNORMAL HIGH (ref 15–41)
Albumin: 3.7 g/dL (ref 3.5–5.0)
Alkaline Phosphatase: 84 U/L (ref 38–126)
Anion gap: 6 (ref 5–15)
BUN: 18 mg/dL (ref 8–23)
CO2: 31 mmol/L (ref 22–32)
Calcium: 9.6 mg/dL (ref 8.9–10.3)
Chloride: 102 mmol/L (ref 98–111)
Creatinine: 0.69 mg/dL (ref 0.44–1.00)
GFR, Estimated: >60 mL/min (ref >60)
Glucose, Bld: 236 mg/dL — ABNORMAL HIGH (ref 70–99)
Potassium: 4.1 mmol/L (ref 3.5–5.1)
Sodium: 139 mmol/L (ref 135–145)
Total Bilirubin: 0.4 mg/dL (ref 0.0–1.2)
Total Protein: 7.4 g/dL (ref 6.5–8.1)

## 2023-08-25 MED ORDER — HEPARIN SOD (PORK) LOCK FLUSH 100 UNIT/ML IV SOLN
500.0000 [IU] | Freq: Once | INTRAVENOUS | Status: AC | PRN
  Administered 2023-08-25: 500 [IU]

## 2023-08-25 MED ORDER — DIPHENHYDRAMINE HCL 25 MG PO CAPS
50.0000 mg | ORAL_CAPSULE | Freq: Once | ORAL | Status: AC
  Administered 2023-08-25: 50 mg via ORAL
  Filled 2023-08-25: qty 2

## 2023-08-25 MED ORDER — LORAZEPAM 2 MG/ML IJ SOLN
0.5000 mg | Freq: Once | INTRAMUSCULAR | Status: AC
  Administered 2023-08-25: 0.5 mg via INTRAVENOUS
  Filled 2023-08-25: qty 1

## 2023-08-25 MED ORDER — SODIUM CHLORIDE 0.9 % IV SOLN: Freq: Once | INTRAVENOUS | Status: AC

## 2023-08-25 MED ORDER — SODIUM CHLORIDE 0.9% FLUSH
10.0000 mL | INTRAVENOUS | Status: DC | PRN
  Administered 2023-08-25: 10 mL

## 2023-08-25 MED ORDER — SODIUM CHLORIDE 0.9% FLUSH
10.0000 mL | Freq: Once | INTRAVENOUS | Status: AC
  Administered 2023-08-25: 10 mL

## 2023-08-25 MED ORDER — TRASTUZUMAB-ANNS CHEMO 150 MG IV SOLR
750.0000 mg | Freq: Once | INTRAVENOUS | Status: AC
  Administered 2023-08-25: 750 mg via INTRAVENOUS
  Filled 2023-08-25: qty 35.71

## 2023-08-25 MED ORDER — ACETAMINOPHEN 325 MG PO TABS
650.0000 mg | ORAL_TABLET | Freq: Once | ORAL | Status: AC
  Administered 2023-08-25: 650 mg via ORAL
  Filled 2023-08-25: qty 2

## 2023-08-25 NOTE — Progress Notes (Signed)
 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 [facility]) Staging form: Breast, AJCC 7th Edition - Clinical: Stage IV (TX, NX, M1) - Signed by Layla Sandria BROCKS, MD on 04/17/2015  Malignant neoplasm of upper-inner quadrant of left breast in female, estrogen receptor positive (HCC) Staging form: Breast, AJCC 7th Edition - Clinical: Stage IA (T1c, N0, cM0) - Unsigned Specimen type: Core Needle Biopsy Histopathologic type: 9931 Laterality: Left Staging comments: Staged at breast conference 4.2.14  - Pathologic: Stage IV (M1) - Unsigned Specimen type: Core Needle Biopsy Histopathologic type: 9931 Laterality: Left   SUMMARY OF ONCOLOGIC HISTORY: 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 Layla     05/23/2013 Initial Diagnosis   Malignant neoplasm of upper-inner quadrant of left breast in female, estrogen receptor positive (HCC)     CURRENT THERAPY: herceptin /Letrozole   INTERVAL HISTORY:  Discussed the use of AI scribe software for clinical note transcription with the patient, who gave verbal consent to proceed.  [name] 73 y.o. female returns for f/u of her breast cancer on treatment with Letrozole .    The patient, with a history of breast cancer, presents after a recent ultrasound. She reports excruciating pain during the procedure, which was performed on a bed. The pain was so severe that it caused a significant increase in  blood pressure and led to a near fainting episode. The patient also reports feeling unwell for a few days after the procedure, experiencing a general sense of weakness. She requests that future ultrasounds be performed in a different setting, preferably in a corner with a garden view, to avoid the pain associated with being moved onto the bed.  The patient also discusses her personal life, including a recent holiday visit to her daughter's house and meeting her granddaughter's fianc for the first time. She expresses concern for her granddaughter, who recently moved to a big city and is struggling with bipolar disorder and the challenges of adapting to a new environment.  She also does not want to do any CT scans for a while unless there is clinical progression because it is extremely painful for her to get up on the bed for CT imaging. Rest of the pertinent 10 point ROS reviewed and neg.  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  MEDICAL HISTORY: Past Medical History:  Diagnosis Date  Arthritis    Back pain    Breast cancer (HCC) dx'd 11/2012   left   Chest pain    COPD (chronic obstructive pulmonary disease) (HCC)    Diabetes mellitus without complication (HCC) 09/28/2014   Ear pain    Fatty liver 6/03   Hypertension    Lung disease    Lung metastases dx'd 11/2012   Obesity,  unspecified    Other abnormal glucose    Suicide attempt (HCC) 1996   Syncope and collapse    Unspecified sleep apnea     SURGICAL HISTORY: Past Surgical History:  Procedure Laterality Date   CARDIAC CATHETERIZATION     2007   CHOLECYSTECTOMY     TUBAL LIGATION      SOCIAL HISTORY: Social History   Socioeconomic History   Marital status: Divorced    Spouse name: Not on file   Number of children: 4   Years of education: 12   Highest education level: 12th grade  Occupational History   Occupation: Disability   Occupation: retired-daycare  Tobacco Use   Smoking status: Former    Current packs/day: 0.00    Average packs/day: 3.0 packs/day for 5.0 years (15.0 ttl pk-yrs)    Types: Cigarettes    Start date: 08/19/1963    Quit date: 08/18/1968    Years since quitting: 55.0    Passive exposure: Past   Smokeless tobacco: Never  Vaping Use   Vaping status: Never Used  Substance and Sexual Activity   Alcohol use: No    Alcohol/week: 0.0 standard drinks of alcohol   Drug use: Yes    Types: Marijuana    Comment: Medical Marijuana   Sexual activity: Not Currently    Partners: Male  Other Topics Concern   Not on file  Social History Narrative   Health Care POA:    Emergency Contact: Alyse Ruth [phone]   End of Life Plan:    Who lives with you: two grandchildren, friend, daughter   Any pets: 3 poodles   Diet: Pt has a variety of protein, starch, vegetables.  Pt is currently working on cutting back on portions for weight loss.   Exercise: Pt has not regular exercise routine.  Occasionally walks around home.   Seatbelts: Pt reports wearing seatbelt occasionally.   Sun Exposure/Protection: Pt does not use sun protection   Hobbies: reading, playing on kindle, ebay         Currently in her home she keeps her granddaughter Chuck Moss, 16, who is the daughter of the patient's daughter Rilla (the patient refers to [name] as my adopted daughter); grandson Ione [place], [place], who is [name]'s half-brother; daughter Butler, and an Algerian friend, Bonner Ruth Forbes, the patient's significant other. Daughter Butler is a Public Librarian, currently unemployed. Son Charlie Dillon Junior works as an personnel officer in [place]. Daughter Rilla is currently in prison due to killing someone in a car accident. Daughter [name] died from aplastic anemia at the age of 1. The patient has a total of 4 grandchildren. She is not a church attender   Social Drivers of Corporate Investment Banker Strain: High Risk (05/02/2023)   Overall Financial Resource Strain (CARDIA)    Difficulty of Paying Living Expenses: Very hard  Food Insecurity: Food Insecurity Present (05/02/2023)   Hunger Vital Sign    Worried About Running Out of Food in the Last Year: Often true    Ran Out of Food in the Last Year: Often true  Transportation  Needs: Unmet Transportation Needs (05/02/2023)   PRAPARE - Transportation    Lack of Transportation (Medical): Yes    Lack of Transportation (Non-Medical): Yes  Physical Activity: Unknown (05/02/2023)   Exercise Vital Sign    Days of Exercise per Week: Patient declined    Minutes of Exercise per Session: Not on file  Stress: Stress Concern Present (05/02/2023)   [employer] of Occupational Health - Occupational Stress Questionnaire    Feeling of Stress : To some extent  Social Connections: Socially Isolated (05/02/2023)   Social Connection and Isolation Panel [NHANES]    Frequency of Communication with Friends and Family: Twice a week    Frequency of Social Gatherings with Friends and Family: Never    Attends Religious Services: Never    Database Administrator or Organizations: No    Attends Engineer, Structural: Not on file    Marital Status: Widowed  Intimate Partner Violence: Not At Risk (07/30/2022)   Humiliation, Afraid, Rape, and Kick questionnaire    Fear of Current or Ex-Partner: No    Emotionally Abused: No     Physically Abused: No    Sexually Abused: No    FAMILY HISTORY: Family History  Problem Relation Age of Onset   Coronary artery disease Father 23   Diabetes Father    Heart disease Father    Breast cancer Mother 12   Cancer Mother 67       breast   Aplastic anemia Daughter        died at age 72   Cancer Maternal Aunt 56       ovarian   Cancer Maternal Grandmother 79       ovarian   Cancer Paternal Aunt 42       ovarian/breast/breast   Coronary artery disease Sister 10   Coronary artery disease Brother 20    PHYSICAL EXAMINATION    Vitals:   08/25/23 1444  BP: (!) 174/56  Pulse: 74  Resp: 16  Temp: 98.3 F (36.8 C)  SpO2: 92%    Physical Exam Constitutional:      General: She is not in acute distress.    Appearance: Normal appearance. She is not toxic-appearing.     Comments: Examined in wheelchair  HENT:     Head: Normocephalic and atraumatic.     Mouth/Throat:     Mouth: Mucous membranes are moist.     Pharynx: Oropharynx is clear. No oropharyngeal exudate or posterior oropharyngeal erythema.  Eyes:     General: No scleral icterus. Cardiovascular:     Rate and Rhythm: Normal rate and regular rhythm.     Pulses: Normal pulses.     Heart sounds: Normal heart sounds.  Pulmonary:     Effort: Pulmonary effort is normal.     Breath sounds: Normal breath sounds.  Abdominal:     General: Abdomen is flat.     Palpations: Abdomen is soft.     Tenderness: There is no abdominal tenderness.     Comments: Limited exam due to body habitus and exam in wheelchair  Musculoskeletal:        General: Swelling (Bilateral LE swelling symmetric) present.     Cervical back: Neck supple.  Lymphadenopathy:     Cervical: No cervical adenopathy.  Skin:    General: Skin is warm and dry.     Findings: No rash.  Neurological:     General: No focal deficit present.     Mental Status: She is alert.  Psychiatric:        Mood and Affect: Mood normal.        Behavior: Behavior  normal.       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  Breast Cancer Stable on Letrozole  and Herceptin . No new symptoms or physical exam findings suggestive of disease progression. -Continue Letrozole  and Herceptin  as prescribed. -Defer CT scan until summer unless new symptoms or clinical findings suggest disease progression. -Patient has extreme difficulty getting up on the CT scanner, she has severe underlying arthralgias.  Asthma Reports using asthma machine and no current respiratory complaints. -Continue current asthma management.  Constipation Managed with Miralax . -Continue Miralax  as needed.  Pain Management Reports significant pain during previous ultrasound procedure. -For future ultrasounds, avoid bed positioning and use corner setup to minimize pain.  General Health Maintenance -Continue current medications and treatments. -Follow-up as scheduled.     All questions were answered. The patient knows to call the clinic with any problems, questions or concerns. We can certainly see the patient much sooner if necessary.  Total encounter time:30 minutes*in face-to-face visit time, chart review, lab review, care coordination, order entry, and documentation of the encounter time.  *Total Encounter Time as defined by the Centers for Medicare and Medicaid Services includes, in addition to the face-to-face time of a patient visit (documented in the note above) non-face-to-face time: obtaining and reviewing outside history, ordering and reviewing medications, tests or procedures, care coordination (communications with other health care professionals or caregivers) and documentation in the medical record.

## 2023-08-25 NOTE — Patient Instructions (Signed)
 [facility] - A DEPT OF [facility]  Discharge Instructions: Thank you for choosing [facility] to provide your oncology and hematology care.   If you have a lab appointment with the Cancer Center, please go directly to the Cancer Center and check in at the registration area.   Wear comfortable clothing and clothing appropriate for easy access to any Portacath or PICC line.   We strive to give you quality time with your provider. You may need to reschedule your appointment if you arrive late (15 or more minutes).  Arriving late affects you and other patients whose appointments are after yours.  Also, if you miss three or more appointments without notifying the office, you may be dismissed from the clinic at the provider's discretion.      For prescription refill requests, have your pharmacy contact our office and allow 72 hours for refills to be completed.    Today you received the following chemotherapy and/or immunotherapy agents kanjinti      To help prevent nausea and vomiting after your treatment, we encourage you to take your nausea medication as directed.  BELOW ARE SYMPTOMS THAT SHOULD BE REPORTED IMMEDIATELY: *FEVER GREATER THAN 100.4 F (38 C) OR HIGHER *CHILLS OR SWEATING *NAUSEA AND VOMITING THAT IS NOT CONTROLLED WITH YOUR NAUSEA MEDICATION *UNUSUAL SHORTNESS OF BREATH *UNUSUAL BRUISING OR BLEEDING *URINARY PROBLEMS (pain or burning when urinating, or frequent urination) *BOWEL PROBLEMS (unusual diarrhea, constipation, pain near the anus) TENDERNESS IN MOUTH AND THROAT WITH OR WITHOUT PRESENCE OF ULCERS (sore throat, sores in mouth, or a toothache) UNUSUAL RASH, SWELLING OR PAIN  UNUSUAL VAGINAL DISCHARGE OR ITCHING   Items with * indicate a potential emergency and should be followed up as soon as possible or go to the Emergency Department if any problems should occur.  Please show the CHEMOTHERAPY ALERT CARD or IMMUNOTHERAPY  ALERT CARD at check-in to the Emergency Department and triage nurse.  Should you have questions after your visit or need to cancel or reschedule your appointment, please contact [facility] - A DEPT OF [facility]  Dept: [phone]  and follow the prompts.  Office hours are 8:00 a.m. to 4:30 p.m. Monday - Friday. Please note that voicemails left after 4:00 p.m. may not be returned until the following business day.  We are closed weekends and major holidays. You have access to a nurse at all times for urgent questions. Please call the main number to the clinic Dept: [phone] and follow the prompts.   For any non-urgent questions, you may also contact your provider using MyChart. We now offer e-Visits for anyone 81 and older to request care online for non-urgent symptoms. For details visit [email or web address].   Also download the MyChart app! Go to the app store, search "MyChart", open the app, select [facility], and log in with your MyChart username and password.

## 2023-08-25 NOTE — Assessment & Plan Note (Signed)
 [name] is a 73 year old woman with metastatic breast cancer to the lung noted in April 2014 (see oncologic history above).  Breast Cancer Stable on Letrozole  and Herceptin . No new symptoms or physical exam findings suggestive of disease progression. -Continue Letrozole  and Herceptin  as prescribed. -Defer CT scan until summer unless new symptoms or clinical findings suggest disease progression. -Patient has extreme difficulty getting up on the CT scanner, she has severe underlying arthralgias.  Asthma Reports using asthma machine and no current respiratory complaints. -Continue current asthma management.  Constipation Managed with Miralax . -Continue Miralax  as needed.  Pain Management Reports significant pain during previous ultrasound procedure. -For future ultrasounds, avoid bed positioning and use corner setup to minimize pain.  General Health Maintenance -Continue current medications and treatments. -Follow-up as scheduled.

## 2023-08-26 ENCOUNTER — Other Ambulatory Visit: Payer: Self-pay

## 2023-08-26 DIAGNOSIS — K5903 Drug induced constipation: Secondary | ICD-10-CM

## 2023-08-26 DIAGNOSIS — G893 Neoplasm related pain (acute) (chronic): Secondary | ICD-10-CM

## 2023-08-26 DIAGNOSIS — Z515 Encounter for palliative care: Secondary | ICD-10-CM

## 2023-08-26 DIAGNOSIS — C50919 Malignant neoplasm of unspecified site of unspecified female breast: Secondary | ICD-10-CM

## 2023-08-26 MED ORDER — OXYCODONE HCL 10 MG PO TABS: 10.0000 mg | ORAL_TABLET | Freq: Four times a day (QID) | ORAL | 0 refills | Status: DC | PRN

## 2023-08-26 MED ORDER — DIAZEPAM 5 MG PO TABS: 5.0000 mg | ORAL_TABLET | Freq: Two times a day (BID) | ORAL | 0 refills | Status: DC | PRN

## 2023-08-26 MED ORDER — POLYETHYLENE GLYCOL 3350 17 GM/SCOOP PO POWD: ORAL | 6 refills | Status: DC

## 2023-08-26 NOTE — Telephone Encounter (Signed)
 Pt called for a medication refill, see associated orders.

## 2023-08-27 ENCOUNTER — Other Ambulatory Visit: Payer: Self-pay

## 2023-08-27 DIAGNOSIS — E1149 Type 2 diabetes mellitus with other diabetic neurological complication: Secondary | ICD-10-CM | POA: Diagnosis not present

## 2023-09-18 NOTE — Progress Notes (Signed)
 Palliative Medicine [facility]  [phone] [phone]   Name: [name] Date: 09/18/2023 MRN: [id number]  DOB: Apr 06, 1951  Patient Care Team: [name], MD as PCP - General (Family Medicine) Rolan Ezra RAMAN, MD as Consulting Physician (Cardiology) Loretha Ash, MD as Consulting Physician (Hematology and Oncology)    INTERVAL HISTORY: [name] is a 73 y.o. female with  medical history including metastatic breast cancer with numerous pulmonary nodules bilaterally s/p chemotherapy currently on herceptin  for maintenance. Now with complaints of uncontrolled back pain.  Palliative ask to see for symptom management.   SOCIAL HISTORY:     reports that she quit smoking about 55 years ago. Her smoking use included cigarettes. She started smoking about 60 years ago. She has a 15 pack-year smoking history. She has been exposed to tobacco smoke. She has never used smokeless tobacco. She reports current drug use. Drug: Marijuana. She reports that she does not drink alcohol.  ADVANCE DIRECTIVES:    CODE STATUS:   PAST MEDICAL HISTORY: Past Medical History:  Diagnosis Date   Arthritis    Back pain    Breast cancer (HCC) dx'd 11/2012   left   Chest pain    COPD (chronic obstructive pulmonary disease) (HCC)    Diabetes mellitus without complication (HCC) 09/28/2014   Ear pain    Fatty liver 6/03   Hypertension    Lung disease    Lung metastases dx'd 11/2012   Obesity, unspecified    Other abnormal glucose    Suicide attempt (HCC) 1996   Syncope and collapse    Unspecified sleep apnea     ALLERGIES:  is allergic to meperidine hcl, penicillins, and aspirin .  MEDICATIONS:  Current Outpatient Medications  Medication Sig Dispense Refill   albuterol  (VENTOLIN  HFA) 108 (90 Base) MCG/ACT inhaler INHALE 2 PUFFS INTO THE LUNGS EVERY 6 HOURS AS NEEDED FOR WHEEZE 8.5 each 1   B-D ULTRAFINE III SHORT PEN 31G X 8 MM MISC PLEASE USE TO  INJECT INSULIN  4 TIMES DAILY. 200 each 3   Blood Glucose Monitoring Suppl (ONE TOUCH ULTRA 2) w/Device KIT 1 kit by Does not apply route QID. ICD 10-code: E11.49. 1 kit 0   diazepam  (VALIUM ) 5 MG tablet Take 1 tablet (5 mg total) by mouth every 12 (twelve) hours as needed for anxiety. 30 tablet 0   insulin  glargine (LANTUS  SOLOSTAR) 100 UNIT/ML Solostar Pen Inject 18 Units into the skin daily.     insulin  lispro (HUMALOG  KWIKPEN) 100 UNIT/ML KwikPen Inject 6-10 Units into the skin 3 (three) times daily. Inject 10 u subq three times a day with meals 15 mL 3   Insulin  Pen Needle (PEN NEEDLES) 30G X 8 MM MISC Please use to inject insulin  4 times daily. 200 each 3   letrozole  (FEMARA ) 2.5 MG tablet TAKE 1 TABLET BY MOUTH EVERY DAY 90 tablet 12   loperamide  (IMODIUM ) 2 MG capsule TAKE 1 CAPSULE (2 MG TOTAL) BY MOUTH AS NEEDED FOR DIARRHEA OR LOOSE STOOLS. 30 capsule 1   ondansetron  (ZOFRAN ) 8 MG tablet Take 1 tablet (8 mg total) by mouth every 8 (eight) hours as needed. 20 tablet 2   ONETOUCH ULTRA test strip USE AS INSTRUCTED TO CHECK ONCE DAILY 100 strip 12   Oxycodone  HCl 10 MG TABS Take 1 tablet (10 mg total) by mouth every 6 (six) hours as needed. 60 tablet 0   OXYGEN  Inhale into the lungs.  polyethylene glycol powder (MIRALAX ) 17 GM/SCOOP powder Take 1 capful twice daily. 238 g 6   Semaglutide , 2 MG/DOSE, (OZEMPIC , 2 MG/DOSE,) 8 MG/3ML SOPN Inject 2 mg into the skin once a week. 3 mL 1   No current facility-administered medications for this visit.    VITAL SIGNS: There were no vitals taken for this visit. There were no vitals filed for this visit.  Estimated body mass index is 52.34 kg/m as calculated from the following:   Height as of 05/05/23: 5' 4 (1.626 m).   Weight as of 08/25/23: 304 lb 14.4 oz (138.3 kg).   PERFORMANCE STATUS (ECOG) : 1 - Symptomatic but completely ambulatory  Assessment NAD Normal breathing pattern, oxygen  in place RRR AAO x3  Discussed the use of AI  scribe software for clinical note transcription with the patient, who gave verbal consent to proceed.   IMPRESSION:  I saw [name] during her infusion. No acute changes at this time. Continues to take things one day at a time.   She is concerned about her recent blood work results, which she received last month and were reported as abnormal. Her cirrhosis of the liver was noted in the results, but she did not receive specific information about the abnormalities. She had another blood test today and is awaiting those results. Her blood sugar was noted to be 230 in January, which has since improved to 208 per labs today. We discussed management of her diabetes. Patient also express increase in weight due to holidays. She is trying to drink plenty of water to manage her condition.  [name] discusses her sleep issues, noting that Ativan  helps her relax and sleep well when administered in a clinical setting. She has previously been prescribed Valium , which she finds ineffective. Has been using Benadryl  to aid sleep, but it often results in her being awake for several hours doing the opposite of intended. She describes feeling exhausted and falling asleep with the TV on due to ongoing insomnia.   Her constipation has improved with the use of MiraLAX , which she purchases from [employer] due to cost considerations. She notes that it is cheaper to buy a large family-sized container from [employer] compared to local pharmacies.  Patient reports persistent back pain however controlled with oxycodone . No changes to regimen at this time.   All questions answered and support provided.  Assessment and Plan  Hyperglycemia Blood sugar levels remain high (230 last month, 208 today). -Continue current management and monitor blood glucose levels. -Follow-up with PCP as needed.   Insomnia Difficulty sleeping, previous trial of Valium  was ineffective. -Prescribe Ativan  for nighttime use to aid  sleep.  Constipation Improved with use of MiraLax . -Continue MiraLax  as needed.  Pain Pain well controlled on current regimen of as needed oxycodone . No adjustments at this time.  -Continue Oxycodone  10mg  as needed every 6 hours for moderate to severe pain.   Social Determinants of Health Difficulty maintaining heat in-home, high-energy costs. -Recommended patient to look into LIEP (Low Income Energy Assistance Program) for potential assistance with energy costs.  I will plan to follow-up with patient in 4-6 weeks. Sooner if needed.  Patient expressed understanding and was in agreement with this plan. She also understands that She can call the clinic at any time with any questions, concerns, or complaints.    Any controlled substances utilized were prescribed in the context of palliative care. PDMP has been reviewed. Visit consisted of counseling and education dealing with the complex  and emotionally intense issues of symptom management and palliative care in the setting of serious and potentially life-threatening illness.  Levon Borer, AGPCNP-BC  Palliative Medicine Team/[facility]

## 2023-09-20 ENCOUNTER — Other Ambulatory Visit: Payer: Self-pay | Admitting: Family Medicine

## 2023-09-20 DIAGNOSIS — E1149 Type 2 diabetes mellitus with other diabetic neurological complication: Secondary | ICD-10-CM

## 2023-09-21 ENCOUNTER — Other Ambulatory Visit: Payer: Self-pay | Admitting: Nurse Practitioner

## 2023-09-21 ENCOUNTER — Other Ambulatory Visit: Payer: Self-pay | Admitting: *Deleted

## 2023-09-21 ENCOUNTER — Other Ambulatory Visit: Payer: Self-pay | Admitting: Family Medicine

## 2023-09-21 ENCOUNTER — Other Ambulatory Visit (HOSPITAL_COMMUNITY): Payer: Self-pay

## 2023-09-21 DIAGNOSIS — C50919 Malignant neoplasm of unspecified site of unspecified female breast: Secondary | ICD-10-CM

## 2023-09-21 DIAGNOSIS — Z515 Encounter for palliative care: Secondary | ICD-10-CM

## 2023-09-21 DIAGNOSIS — R110 Nausea: Secondary | ICD-10-CM

## 2023-09-21 DIAGNOSIS — E1149 Type 2 diabetes mellitus with other diabetic neurological complication: Secondary | ICD-10-CM

## 2023-09-21 MED ORDER — OZEMPIC (2 MG/DOSE) 8 MG/3ML ~~LOC~~ SOPN
2.0000 mg | PEN_INJECTOR | SUBCUTANEOUS | 1 refills | Status: DC
  Filled 2023-09-21: qty 3, 28d supply, fill #0
  Filled 2023-10-17: qty 3, 28d supply, fill #1

## 2023-09-22 ENCOUNTER — Encounter: Payer: Self-pay | Admitting: Nurse Practitioner

## 2023-09-22 ENCOUNTER — Inpatient Hospital Stay: Payer: 59 | Attending: Oncology

## 2023-09-22 ENCOUNTER — Inpatient Hospital Stay (HOSPITAL_BASED_OUTPATIENT_CLINIC_OR_DEPARTMENT_OTHER): Payer: 59 | Admitting: Nurse Practitioner

## 2023-09-22 ENCOUNTER — Inpatient Hospital Stay: Payer: 59

## 2023-09-22 ENCOUNTER — Other Ambulatory Visit (HOSPITAL_COMMUNITY): Payer: Self-pay

## 2023-09-22 VITALS — BP 126/61 | HR 66 | Temp 98.5°F | Resp 16 | Wt 306.2 lb

## 2023-09-22 DIAGNOSIS — C50212 Malignant neoplasm of upper-inner quadrant of left female breast: Secondary | ICD-10-CM | POA: Diagnosis not present

## 2023-09-22 DIAGNOSIS — Z95828 Presence of other vascular implants and grafts: Secondary | ICD-10-CM

## 2023-09-22 DIAGNOSIS — G4700 Insomnia, unspecified: Secondary | ICD-10-CM

## 2023-09-22 DIAGNOSIS — C7800 Secondary malignant neoplasm of unspecified lung: Secondary | ICD-10-CM

## 2023-09-22 DIAGNOSIS — G893 Neoplasm related pain (acute) (chronic): Secondary | ICD-10-CM | POA: Diagnosis not present

## 2023-09-22 DIAGNOSIS — C50919 Malignant neoplasm of unspecified site of unspecified female breast: Secondary | ICD-10-CM

## 2023-09-22 DIAGNOSIS — Z5112 Encounter for antineoplastic immunotherapy: Secondary | ICD-10-CM | POA: Insufficient documentation

## 2023-09-22 DIAGNOSIS — R530 Neoplastic (malignant) related fatigue: Secondary | ICD-10-CM

## 2023-09-22 DIAGNOSIS — Z515 Encounter for palliative care: Secondary | ICD-10-CM

## 2023-09-22 DIAGNOSIS — K5903 Drug induced constipation: Secondary | ICD-10-CM | POA: Diagnosis not present

## 2023-09-22 DIAGNOSIS — Z170 Estrogen receptor positive status [ER+]: Secondary | ICD-10-CM | POA: Insufficient documentation

## 2023-09-22 LAB — CMP (CANCER CENTER ONLY)
ALT: 56 U/L — ABNORMAL HIGH (ref 0–44)
AST: 39 U/L (ref 15–41)
Albumin: 3.7 g/dL (ref 3.5–5.0)
Alkaline Phosphatase: 84 U/L (ref 38–126)
Anion gap: 5 (ref 5–15)
BUN: 18 mg/dL (ref 8–23)
CO2: 31 mmol/L (ref 22–32)
Calcium: 9.5 mg/dL (ref 8.9–10.3)
Chloride: 101 mmol/L (ref 98–111)
Creatinine: 0.7 mg/dL (ref 0.44–1.00)
GFR, Estimated: >60 mL/min (ref >60)
Glucose, Bld: 208 mg/dL — ABNORMAL HIGH (ref 70–99)
Potassium: 4 mmol/L (ref 3.5–5.1)
Sodium: 137 mmol/L (ref 135–145)
Total Bilirubin: 0.4 mg/dL (ref 0.0–1.2)
Total Protein: 7.3 g/dL (ref 6.5–8.1)

## 2023-09-22 LAB — CBC WITH DIFFERENTIAL (CANCER CENTER ONLY)
Abs Immature Granulocytes: 0.03 10*3/uL (ref 0.00–0.07)
Basophils Absolute: 0 10*3/uL (ref 0.0–0.1)
Basophils Relative: 0 %
Eosinophils Absolute: 0.1 10*3/uL (ref 0.0–0.5)
Eosinophils Relative: 2 %
HCT: 42.2 % (ref 36.0–46.0)
Hemoglobin: 13.8 g/dL (ref 12.0–15.0)
Immature Granulocytes: 0 %
Lymphocytes Relative: 25 %
Lymphs Abs: 1.7 10*3/uL (ref 0.7–4.0)
MCH: 28.8 pg (ref 26.0–34.0)
MCHC: 32.7 g/dL (ref 30.0–36.0)
MCV: 88.1 fL (ref 80.0–100.0)
Monocytes Absolute: 0.4 10*3/uL (ref 0.1–1.0)
Monocytes Relative: 7 %
Neutro Abs: 4.5 10*3/uL (ref 1.7–7.7)
Neutrophils Relative %: 66 %
Platelet Count: 227 10*3/uL (ref 150–400)
RBC: 4.79 MIL/uL (ref 3.87–5.11)
RDW: 13.6 % (ref 11.5–15.5)
WBC Count: 6.8 10*3/uL (ref 4.0–10.5)
nRBC: 0 % (ref 0.0–0.2)

## 2023-09-22 MED ORDER — HEPARIN SOD (PORK) LOCK FLUSH 100 UNIT/ML IV SOLN: 500.0000 [IU] | Freq: Once | INTRAVENOUS | Status: DC | PRN

## 2023-09-22 MED ORDER — OXYCODONE HCL 10 MG PO TABS: 10.0000 mg | ORAL_TABLET | Freq: Four times a day (QID) | ORAL | 0 refills | Status: DC | PRN

## 2023-09-22 MED ORDER — DIPHENHYDRAMINE HCL 25 MG PO CAPS
50.0000 mg | ORAL_CAPSULE | Freq: Once | ORAL | Status: AC
  Administered 2023-09-22: 50 mg via ORAL
  Filled 2023-09-22: qty 2

## 2023-09-22 MED ORDER — TRASTUZUMAB-ANNS CHEMO 150 MG IV SOLR
750.0000 mg | Freq: Once | INTRAVENOUS | Status: AC
  Administered 2023-09-22: 750 mg via INTRAVENOUS
  Filled 2023-09-22: qty 35.71

## 2023-09-22 MED ORDER — LORAZEPAM 2 MG/ML IJ SOLN
0.5000 mg | Freq: Once | INTRAMUSCULAR | Status: AC
  Administered 2023-09-22: 0.5 mg via INTRAVENOUS
  Filled 2023-09-22: qty 1

## 2023-09-22 MED ORDER — ACETAMINOPHEN 325 MG PO TABS
650.0000 mg | ORAL_TABLET | Freq: Once | ORAL | Status: AC
Start: 2023-09-22 — End: 2023-09-22
  Administered 2023-09-22: 650 mg via ORAL
  Filled 2023-09-22: qty 2

## 2023-09-22 MED ORDER — SODIUM CHLORIDE 0.9% FLUSH: 10.0000 mL | INTRAVENOUS | Status: DC | PRN

## 2023-09-22 MED ORDER — LORAZEPAM 0.5 MG PO TABS: 0.5000 mg | ORAL_TABLET | Freq: Every evening | ORAL | 0 refills | Status: DC | PRN

## 2023-09-22 MED ORDER — SODIUM CHLORIDE 0.9% FLUSH
10.0000 mL | Freq: Once | INTRAVENOUS | Status: AC
Start: 2023-09-22 — End: 2023-09-22
  Administered 2023-09-22: 10 mL

## 2023-09-22 MED ORDER — SODIUM CHLORIDE 0.9 % IV SOLN
Freq: Once | INTRAVENOUS | Status: AC
Start: 2023-09-22 — End: 2023-09-22

## 2023-09-22 NOTE — Patient Instructions (Signed)
 [facility] - A DEPT OF [facility]  Discharge Instructions: Thank you for choosing [facility] to provide your oncology and hematology care.   If you have a lab appointment with the Cancer Center, please go directly to the Cancer Center and check in at the registration area.   Wear comfortable clothing and clothing appropriate for easy access to any Portacath or PICC line.   We strive to give you quality time with your provider. You may need to reschedule your appointment if you arrive late (15 or more minutes).  Arriving late affects you and other patients whose appointments are after yours.  Also, if you miss three or more appointments without notifying the office, you may be dismissed from the clinic at the provider's discretion.      For prescription refill requests, have your pharmacy contact our office and allow 72 hours for refills to be completed.    Today you received the following chemotherapy and/or immunotherapy agents kanjinti      To help prevent nausea and vomiting after your treatment, we encourage you to take your nausea medication as directed.  BELOW ARE SYMPTOMS THAT SHOULD BE REPORTED IMMEDIATELY: *FEVER GREATER THAN 100.4 F (38 C) OR HIGHER *CHILLS OR SWEATING *NAUSEA AND VOMITING THAT IS NOT CONTROLLED WITH YOUR NAUSEA MEDICATION *UNUSUAL SHORTNESS OF BREATH *UNUSUAL BRUISING OR BLEEDING *URINARY PROBLEMS (pain or burning when urinating, or frequent urination) *BOWEL PROBLEMS (unusual diarrhea, constipation, pain near the anus) TENDERNESS IN MOUTH AND THROAT WITH OR WITHOUT PRESENCE OF ULCERS (sore throat, sores in mouth, or a toothache) UNUSUAL RASH, SWELLING OR PAIN  UNUSUAL VAGINAL DISCHARGE OR ITCHING   Items with * indicate a potential emergency and should be followed up as soon as possible or go to the Emergency Department if any problems should occur.  Please show the CHEMOTHERAPY ALERT CARD or IMMUNOTHERAPY  ALERT CARD at check-in to the Emergency Department and triage nurse.  Should you have questions after your visit or need to cancel or reschedule your appointment, please contact [facility] - A DEPT OF [facility]  Dept: [phone]  and follow the prompts.  Office hours are 8:00 a.m. to 4:30 p.m. Monday - Friday. Please note that voicemails left after 4:00 p.m. may not be returned until the following business day.  We are closed weekends and major holidays. You have access to a nurse at all times for urgent questions. Please call the main number to the clinic Dept: [phone] and follow the prompts.   For any non-urgent questions, you may also contact your provider using MyChart. We now offer e-Visits for anyone 81 and older to request care online for non-urgent symptoms. For details visit [email or web address].   Also download the MyChart app! Go to the app store, search "MyChart", open the app, select [facility], and log in with your MyChart username and password.

## 2023-09-27 DIAGNOSIS — E1149 Type 2 diabetes mellitus with other diabetic neurological complication: Secondary | ICD-10-CM | POA: Diagnosis not present

## 2023-10-02 ENCOUNTER — Other Ambulatory Visit: Payer: Self-pay | Admitting: Family Medicine

## 2023-10-02 DIAGNOSIS — E1149 Type 2 diabetes mellitus with other diabetic neurological complication: Secondary | ICD-10-CM

## 2023-10-16 ENCOUNTER — Telehealth: Payer: Self-pay | Admitting: Pharmacist

## 2023-10-16 ENCOUNTER — Telehealth: Payer: Self-pay | Admitting: *Deleted

## 2023-10-16 NOTE — Telephone Encounter (Signed)
 Pt called with c/o possible sinus infection and having clear mucus and now it has come back. Recommended pt to call PCP or do virtual to be evaluated for rhinitis or any other infection. Did let pt know that Dr.Iruku will be made aware of symptoms and when she comes in to appt next week she can evaluate her further. Pt verbalized understanding.

## 2023-10-16 NOTE — Telephone Encounter (Signed)
 Patient contacts office and requests solution for required diabetes visit to continue use/access to [place] sensors through [employer].   Since last contact patient reports her glucose readings are slightly worse.  Glucose averages 190 recently.   We scheduled video visit on 3/5 next week.  Patient will upload Libre 3 CGM results to allow [place] to review prior to the visit.    Total time with patient call and documentation of interaction: 14 minutes.

## 2023-10-19 ENCOUNTER — Other Ambulatory Visit: Payer: Self-pay

## 2023-10-19 ENCOUNTER — Other Ambulatory Visit: Payer: Self-pay | Admitting: *Deleted

## 2023-10-19 DIAGNOSIS — C50919 Malignant neoplasm of unspecified site of unspecified female breast: Secondary | ICD-10-CM

## 2023-10-19 NOTE — Telephone Encounter (Signed)
 Reviewed and agree with Dr Macky Lower plan.

## 2023-10-19 NOTE — Progress Notes (Unsigned)
 Palliative Medicine [facility]  [phone] [phone]   Name: [name] Date: 10/19/2023 MRN: [id number]  DOB: 02/03/1951  Patient Care Team: Elberta Fortis, MD as PCP - General (Family Medicine) Laurey Morale, MD as Consulting Physician (Cardiology) Rachel Moulds, MD as Consulting Physician (Hematology and Oncology)    INTERVAL HISTORY: [name] is a 73 y.o. female with  medical history including metastatic breast cancer with numerous pulmonary nodules bilaterally s/p chemotherapy currently on herceptin for maintenance. Now with complaints of uncontrolled back pain.  Palliative ask to see for symptom management.   SOCIAL HISTORY:     reports that she quit smoking about 55 years ago. Her smoking use included cigarettes. She started smoking about 60 years ago. She has a 15 pack-year smoking history. She has been exposed to tobacco smoke. She has never used smokeless tobacco. She reports current drug use. Drug: Marijuana. She reports that she does not drink alcohol.  ADVANCE DIRECTIVES:    CODE STATUS:   PAST MEDICAL HISTORY: Past Medical History:  Diagnosis Date   Arthritis    Back pain    Breast cancer (HCC) dx'd 11/2012   left   Chest pain    COPD (chronic obstructive pulmonary disease) (HCC)    Diabetes mellitus without complication (HCC) 09/28/2014   Ear pain    Fatty liver 6/03   Hypertension    Lung disease    Lung metastases dx'd 11/2012   Obesity, unspecified    Other abnormal glucose    Suicide attempt (HCC) 1996   Syncope and collapse    Unspecified sleep apnea     ALLERGIES:  is allergic to meperidine hcl, penicillins, and aspirin.  MEDICATIONS:  Current Outpatient Medications  Medication Sig Dispense Refill   albuterol (VENTOLIN HFA) 108 (90 Base) MCG/ACT inhaler INHALE 2 PUFFS INTO THE LUNGS EVERY 6 HOURS AS NEEDED FOR WHEEZE 8.5 each 1   B-D ULTRAFINE III SHORT PEN 31G X 8 MM MISC PLEASE USE TO  INJECT INSULIN 4 TIMES DAILY. 200 each 3   Blood Glucose Monitoring Suppl (ONE TOUCH ULTRA 2) w/Device KIT 1 kit by Does not apply route QID. ICD 10-code: E11.49. 1 kit 0   insulin glargine (LANTUS SOLOSTAR) 100 UNIT/ML Solostar Pen Inject 14 Units into the skin daily. 3 mL 0   insulin lispro (HUMALOG KWIKPEN) 100 UNIT/ML KwikPen Inject 6-10 Units into the skin 3 (three) times daily. Inject 10 u subq three times a day with meals 15 mL 3   Insulin Pen Needle (PEN NEEDLES) 30G X 8 MM MISC Please use to inject insulin 4 times daily. 200 each 3   letrozole (FEMARA) 2.5 MG tablet TAKE 1 TABLET BY MOUTH EVERY DAY 90 tablet 12   loperamide (IMODIUM) 2 MG capsule TAKE 1 CAPSULE (2 MG TOTAL) BY MOUTH AS NEEDED FOR DIARRHEA OR LOOSE STOOLS. 30 capsule 1   LORazepam (ATIVAN) 0.5 MG tablet Take 1 tablet (0.5 mg total) by mouth at bedtime as needed for anxiety. 20 tablet 0   ondansetron (ZOFRAN) 8 MG tablet TAKE 1 TABLET BY MOUTH EVERY 8 HOURS AS NEEDED 20 tablet 2   ONETOUCH ULTRA test strip USE AS INSTRUCTED TO CHECK ONCE DAILY 100 strip 12   Oxycodone HCl 10 MG TABS Take 1 tablet (10 mg total) by mouth every 6 (six) hours as needed. 60 tablet 0   OXYGEN Inhale into the lungs.     polyethylene glycol powder (MIRALAX) 17  GM/SCOOP powder Take 1 capful twice daily. 238 g 6   Semaglutide, 2 MG/DOSE, (OZEMPIC, 2 MG/DOSE,) 8 MG/3ML SOPN Inject 2 mg into the skin once a week. 3 mL 1   No current facility-administered medications for this visit.    VITAL SIGNS: There were no vitals taken for this visit. There were no vitals filed for this visit.  Estimated body mass index is 52.57 kg/m as calculated from the following:   Height as of 05/05/23: 5\' 4"  (1.626 m).   Weight as of 09/22/23: 306 lb 4 oz (138.9 kg).   PERFORMANCE STATUS (ECOG) : 1 - Symptomatic but completely ambulatory  Discussed the use of AI scribe software for clinical note transcription with the patient, who gave verbal consent to proceed.    IMPRESSION:  I connected by phone with [name] for symptom management follow-up. She shares she is 'doing all right' overall after recently recovering from bronchitis. Symptoms are improving. Reports occasional nausea which is controlled with medication. Denies diarrhea. Constipation which she is controlling with Miralax and other stool softeners.   Pain is controlled with current regimen. No adjustments at this time. She is currently taking Ativan every night for anxiety but is uncertain about its effects, stating that she 'didn't like that feeling.' She is considering switching back to diazepam but continues with Ativan for now.  We will plan to continue close monitoring and support.  Assessment and Plan Bronchitis Recovery phase. No new symptoms or complications reported. -Continue current management plan.  Anxiety Currently on Ativan. Patient is considering switching back to Diazepam due to discomfort with Ativan. -Continue Ativan for now. -Patient to inform provider if decision is made to switch back to Diazepam.  Follow-up in a 4-6 weeks. Sooner if needed.  Patient expressed understanding and was in agreement with this plan. She also understands that She can call the clinic at any time with any questions, concerns, or complaints.    I provided 20 minutes of non face-to-face telephone visit time during this encounter, and > 50% was spent counseling as documented under my assessment & plan. Any controlled substances utilized were prescribed in the context of palliative care. PDMP has been reviewed. Visit consisted of counseling and education dealing with the complex and emotionally intense issues of symptom management and palliative care in the setting of serious and potentially life-threatening illness.  [name], AGPCNP-BC  Palliative Medicine Team/[facility]

## 2023-10-20 ENCOUNTER — Inpatient Hospital Stay: Payer: 59

## 2023-10-20 ENCOUNTER — Inpatient Hospital Stay: Payer: 59 | Attending: Oncology

## 2023-10-20 ENCOUNTER — Encounter: Payer: Self-pay | Admitting: Hematology and Oncology

## 2023-10-20 ENCOUNTER — Inpatient Hospital Stay (HOSPITAL_BASED_OUTPATIENT_CLINIC_OR_DEPARTMENT_OTHER): Payer: 59 | Admitting: Hematology and Oncology

## 2023-10-20 ENCOUNTER — Inpatient Hospital Stay (HOSPITAL_BASED_OUTPATIENT_CLINIC_OR_DEPARTMENT_OTHER): Payer: 59 | Admitting: Nurse Practitioner

## 2023-10-20 VITALS — BP 164/71 | HR 74 | Temp 98.0°F | Resp 20 | Ht 64.0 in | Wt 301.6 lb

## 2023-10-20 DIAGNOSIS — Z170 Estrogen receptor positive status [ER+]: Secondary | ICD-10-CM | POA: Diagnosis not present

## 2023-10-20 DIAGNOSIS — Z515 Encounter for palliative care: Secondary | ICD-10-CM | POA: Diagnosis not present

## 2023-10-20 DIAGNOSIS — C50919 Malignant neoplasm of unspecified site of unspecified female breast: Secondary | ICD-10-CM

## 2023-10-20 DIAGNOSIS — Z803 Family history of malignant neoplasm of breast: Secondary | ICD-10-CM | POA: Insufficient documentation

## 2023-10-20 DIAGNOSIS — C7800 Secondary malignant neoplasm of unspecified lung: Secondary | ICD-10-CM

## 2023-10-20 DIAGNOSIS — Z95828 Presence of other vascular implants and grafts: Secondary | ICD-10-CM

## 2023-10-20 DIAGNOSIS — Z8041 Family history of malignant neoplasm of ovary: Secondary | ICD-10-CM | POA: Diagnosis not present

## 2023-10-20 DIAGNOSIS — R530 Neoplastic (malignant) related fatigue: Secondary | ICD-10-CM

## 2023-10-20 DIAGNOSIS — Z79811 Long term (current) use of aromatase inhibitors: Secondary | ICD-10-CM | POA: Diagnosis not present

## 2023-10-20 DIAGNOSIS — Z5112 Encounter for antineoplastic immunotherapy: Secondary | ICD-10-CM | POA: Insufficient documentation

## 2023-10-20 DIAGNOSIS — F419 Anxiety disorder, unspecified: Secondary | ICD-10-CM | POA: Diagnosis not present

## 2023-10-20 DIAGNOSIS — Z87891 Personal history of nicotine dependence: Secondary | ICD-10-CM | POA: Diagnosis not present

## 2023-10-20 DIAGNOSIS — G893 Neoplasm related pain (acute) (chronic): Secondary | ICD-10-CM | POA: Diagnosis not present

## 2023-10-20 DIAGNOSIS — C50212 Malignant neoplasm of upper-inner quadrant of left female breast: Secondary | ICD-10-CM | POA: Diagnosis not present

## 2023-10-20 LAB — CMP (CANCER CENTER ONLY)
ALT: 116 U/L — ABNORMAL HIGH (ref 0–44)
AST: 107 U/L — ABNORMAL HIGH (ref 15–41)
Albumin: 3.8 g/dL (ref 3.5–5.0)
Alkaline Phosphatase: 123 U/L (ref 38–126)
Anion gap: 6 (ref 5–15)
BUN: 14 mg/dL (ref 8–23)
CO2: 32 mmol/L (ref 22–32)
Calcium: 9.7 mg/dL (ref 8.9–10.3)
Chloride: 102 mmol/L (ref 98–111)
Creatinine: 0.72 mg/dL (ref 0.44–1.00)
GFR, Estimated: >60 mL/min (ref >60)
Glucose, Bld: 164 mg/dL — ABNORMAL HIGH (ref 70–99)
Potassium: 4.3 mmol/L (ref 3.5–5.1)
Sodium: 140 mmol/L (ref 135–145)
Total Bilirubin: 0.6 mg/dL (ref 0.0–1.2)
Total Protein: 7.6 g/dL (ref 6.5–8.1)

## 2023-10-20 LAB — CBC WITH DIFFERENTIAL (CANCER CENTER ONLY)
Abs Immature Granulocytes: 0.03 10*3/uL (ref 0.00–0.07)
Basophils Absolute: 0 10*3/uL (ref 0.0–0.1)
Basophils Relative: 0 %
Eosinophils Absolute: 0.1 10*3/uL (ref 0.0–0.5)
Eosinophils Relative: 1 %
HCT: 44 % (ref 36.0–46.0)
Hemoglobin: 14.5 g/dL (ref 12.0–15.0)
Immature Granulocytes: 1 %
Lymphocytes Relative: 26 %
Lymphs Abs: 1.5 10*3/uL (ref 0.7–4.0)
MCH: 29 pg (ref 26.0–34.0)
MCHC: 33 g/dL (ref 30.0–36.0)
MCV: 88 fL (ref 80.0–100.0)
Monocytes Absolute: 0.4 10*3/uL (ref 0.1–1.0)
Monocytes Relative: 8 %
Neutro Abs: 3.7 10*3/uL (ref 1.7–7.7)
Neutrophils Relative %: 64 %
Platelet Count: 246 10*3/uL (ref 150–400)
RBC: 5 MIL/uL (ref 3.87–5.11)
RDW: 14 % (ref 11.5–15.5)
WBC Count: 5.7 10*3/uL (ref 4.0–10.5)
nRBC: 0 % (ref 0.0–0.2)

## 2023-10-20 MED ORDER — ACETAMINOPHEN 325 MG PO TABS
650.0000 mg | ORAL_TABLET | Freq: Once | ORAL | Status: AC
  Administered 2023-10-20: 650 mg via ORAL
  Filled 2023-10-20: qty 2

## 2023-10-20 MED ORDER — HEPARIN SOD (PORK) LOCK FLUSH 100 UNIT/ML IV SOLN
500.0000 [IU] | Freq: Once | INTRAVENOUS | Status: AC | PRN
  Administered 2023-10-20: 500 [IU]

## 2023-10-20 MED ORDER — SODIUM CHLORIDE 0.9% FLUSH
10.0000 mL | INTRAVENOUS | Status: DC | PRN
  Administered 2023-10-20: 10 mL

## 2023-10-20 MED ORDER — TRASTUZUMAB-ANNS CHEMO 150 MG IV SOLR
750.0000 mg | Freq: Once | INTRAVENOUS | Status: AC
  Administered 2023-10-20: 750 mg via INTRAVENOUS
  Filled 2023-10-20: qty 35.71

## 2023-10-20 MED ORDER — LORAZEPAM 2 MG/ML IJ SOLN
0.5000 mg | Freq: Once | INTRAMUSCULAR | Status: AC
  Administered 2023-10-20: 0.5 mg via INTRAVENOUS
  Filled 2023-10-20: qty 1

## 2023-10-20 MED ORDER — DIPHENHYDRAMINE HCL 25 MG PO CAPS
50.0000 mg | ORAL_CAPSULE | Freq: Once | ORAL | Status: AC
  Administered 2023-10-20: 50 mg via ORAL
  Filled 2023-10-20: qty 2

## 2023-10-20 MED ORDER — SODIUM CHLORIDE 0.9% FLUSH
10.0000 mL | Freq: Once | INTRAVENOUS | Status: AC
  Administered 2023-10-20: 10 mL

## 2023-10-20 MED ORDER — SODIUM CHLORIDE 0.9 % IV SOLN: Freq: Once | INTRAVENOUS | Status: AC

## 2023-10-20 NOTE — Progress Notes (Signed)
 [facility] Cancer Follow up:    Jenna Fortis, MD [address] [place] [address]   DIAGNOSIS:  Cancer Staging  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 [facility]) Staging form: Breast, AJCC 7th Edition - Clinical: Stage IV (TX, NX, M1) - Signed by Lowella Dell, MD on 04/17/2015  Malignant neoplasm of upper-inner quadrant of left breast in female, estrogen receptor positive (HCC) Staging form: Breast, AJCC 7th Edition - Clinical: Stage IA (T1c, N0, cM0) - Unsigned Specimen type: Core Needle Biopsy Histopathologic type: 9931 Laterality: Left Staging comments: Staged at breast conference 4.2.14  - Pathologic: Stage IV (M1) - Unsigned Specimen type: Core Needle Biopsy Histopathologic type: 9931 Laterality: Left   SUMMARY OF ONCOLOGIC HISTORY: 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 Darnelle Catalan     05/23/2013 Initial Diagnosis   Malignant neoplasm of upper-inner quadrant of left breast in female, estrogen receptor positive (HCC)     CURRENT THERAPY: herceptin/Letrozole  INTERVAL HISTORY:  Discussed the use of AI scribe software for clinical note transcription with the patient, who gave verbal consent to proceed.  [name] 73 y.o. female returns for f/u of her breast cancer on treatment with Letrozole and herceptin  She is undergoing follow-up for her cancer treatment and is due for a surveillance scan in March, which is performed every six months. She is concerned about the copay for the scan, which is $102, as her income slightly exceeds the  Medicaid limit. She recalls a previous arrangement where the hospital covered the copay under a specific act and hopes to utilize this again.  She experiences severe pain related to her condition, describing it as 'way beyond a ten' and causing her to pass out. Oxycontin, which she has taken for pain, does not alleviate her symptoms and induces nausea. She has tried taking nausea medication with it, but it was ineffective.  She has a history of fluctuating bowel habits, alternating between diarrhea and constipation, but denies any blood in her stool. No recent falls or episodes of passing out.  Her social history includes raising her grandson since he was three years old. Her daughter, the grandson's mother, is currently serving a 30-year prison sentence. Her grandson is in high school and has shown academic potential, although his grades have recently declined. Rest of the pertinent 10 point ROS reviewed and neg.  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  MEDICAL HISTORY: Past Medical History:  Diagnosis Date  Arthritis    Back pain    Breast cancer (HCC) dx'd 11/2012   left   Chest pain    COPD (chronic obstructive pulmonary disease) (HCC)    Diabetes mellitus without complication (HCC) 09/28/2014   Ear pain    Fatty liver 6/03   Hypertension    Lung disease    Lung  metastases dx'd 11/2012   Obesity, unspecified    Other abnormal glucose    Suicide attempt (HCC) 1996   Syncope and collapse    Unspecified sleep apnea     SURGICAL HISTORY: Past Surgical History:  Procedure Laterality Date   CARDIAC CATHETERIZATION     2007   CHOLECYSTECTOMY     TUBAL LIGATION      SOCIAL HISTORY: Social History   Socioeconomic History   Marital status: Divorced    Spouse name: Not on file   Number of children: 4   Years of education: 12   Highest education level: 12th grade  Occupational History   Occupation: Disability   Occupation: retired-daycare  Tobacco Use   Smoking status: Former    Current packs/day: 0.00    Average packs/day: 3.0 packs/day for 5.0 years (15.0 ttl pk-yrs)    Types: Cigarettes    Start date: 08/19/1963    Quit date: 08/18/1968    Years since quitting: 55.2    Passive exposure: Past   Smokeless tobacco: Never  Vaping Use   Vaping status: Never Used  Substance and Sexual Activity   Alcohol use: No    Alcohol/week: 0.0 standard drinks of alcohol   Drug use: Yes    Types: Marijuana    Comment: Medical Marijuana   Sexual activity: Not Currently    Partners: Male  Other Topics Concern   Not on file  Social History Narrative   Health Care POA:    Emergency Contact: Amador Cunas [phone]   End of Life Plan:    Who lives with you: two grandchildren, friend, daughter   Any pets: 3 poodles   Diet: Pt has a variety of protein, starch, vegetables.  Pt is currently working on cutting back on portions for weight loss.   Exercise: Pt has not regular exercise routine.  Occasionally walks around home.   Seatbelts: Pt reports wearing seatbelt occasionally.   Sun Exposure/Protection: Pt does not use sun protection   Hobbies: reading, playing on kindle, ebay         Currently in her home she keeps her granddaughter [name], 16, who is the daughter of the patient's daughter Para March (the patient refers to [name] as "my adopted  daughter"); grandson Reveles "[name]" Shv[place], [place], who is [name]'s half-brother; daughter [name], and an [place] friend, [name], the patient's significant other. Daughter [name] is a Public librarian, currently unemployed. Son [name] works as an Personnel officer in [place]. Daughter Para March is currently in prison due to killing someone in a car accident. Daughter [name] died from aplastic anemia at the age of 57. The patient has a total of 4 grandchildren. She is not a church attender   Social Drivers of Corporate investment banker Strain: High Risk (05/02/2023)   Overall Financial Resource Strain (CARDIA)    Difficulty of Paying Living Expenses: Very hard  Food Insecurity: Food Insecurity Present (05/02/2023)   Hunger Vital Sign    Worried About Running Out of Food in the Last Year: Often true    Ran Out of Food in the Last Year: Often true  Transportation  Needs: Unmet Transportation Needs (05/02/2023)   PRAPARE - Transportation    Lack of Transportation (Medical): Yes    Lack of Transportation (Non-Medical): Yes  Physical Activity: Unknown (05/02/2023)   Exercise Vital Sign    Days of Exercise per Week: Patient declined    Minutes of Exercise per Session: Not on file  Stress: Stress Concern Present (05/02/2023)   [employer] of Occupational Health - Occupational Stress Questionnaire    Feeling of Stress : To some extent  Social Connections: Socially Isolated (05/02/2023)   Social Connection and Isolation Panel [NHANES]    Frequency of Communication with Friends and Family: Twice a week    Frequency of Social Gatherings with Friends and Family: Never    Attends Religious Services: Never    Database administrator or Organizations: No    Attends Engineer, structural: Not on file    Marital Status: Widowed  Intimate Partner Violence: Not At Risk (07/30/2022)   Humiliation, Afraid, Rape, and Kick questionnaire    Fear of Current or Ex-Partner: No     Emotionally Abused: No    Physically Abused: No    Sexually Abused: No    FAMILY HISTORY: Family History  Problem Relation Age of Onset   Coronary artery disease Father 14   Diabetes Father    Heart disease Father    Breast cancer Mother 57   Cancer Mother 20       breast   Aplastic anemia Daughter        died at age 89   Cancer Maternal Aunt 32       ovarian   Cancer Maternal Grandmother 71       ovarian   Cancer Paternal Aunt 19       ovarian/breast/breast   Coronary artery disease Sister 54   Coronary artery disease Brother 49    PHYSICAL EXAMINATION    Vitals:   10/20/23 1411 10/20/23 1413  BP: (!) 160/63 (!) 164/71  Pulse: 74   Resp: 20   Temp: 98 F (36.7 C)   SpO2: (!) 74%      Physical Exam Constitutional:      General: She is not in acute distress.    Appearance: Normal appearance. She is not toxic-appearing.     Comments: Examined in wheelchair  HENT:     Head: Normocephalic and atraumatic.     Mouth/Throat:     Mouth: Mucous membranes are moist.     Pharynx: Oropharynx is clear. No oropharyngeal exudate or posterior oropharyngeal erythema.  Eyes:     General: No scleral icterus. Cardiovascular:     Rate and Rhythm: Normal rate and regular rhythm.     Pulses: Normal pulses.     Heart sounds: Normal heart sounds.  Pulmonary:     Effort: Pulmonary effort is normal.     Breath sounds: Normal breath sounds.  Abdominal:     General: Abdomen is flat.     Palpations: Abdomen is soft.     Tenderness: There is no abdominal tenderness.     Comments: Limited exam due to body habitus and exam in wheelchair  Musculoskeletal:        General: Swelling (Bilateral LE swelling symmetric) present.     Cervical back: Neck supple.  Lymphadenopathy:     Cervical: No cervical adenopathy.  Skin:    General: Skin is warm and dry.     Findings: No rash.  Neurological:     General: No focal deficit  present.     Mental Status: She is alert.  Psychiatric:         Mood and Affect: Mood normal.        Behavior: Behavior normal.       ASSESSMENT and THERAPY PLAN:   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 [name]) Myrella is a 73 year old woman with metastatic breast cancer to the lung noted in April 2014 (see oncologic history above).  Breast Cancer Stable on Letrozole and Herceptin. No new symptoms or physical exam findings suggestive of disease progression. -Continue Letrozole and Herceptin as prescribed. -Biannual scan required for metastasis monitoring.  -Discussed financial and pain concerns. No viable scan alternatives. -Consult Child psychotherapist for copay assistance. -Proceed with scan if assistance obtained.  Echocardiogram monitoring Biannual echocardiograms necessary for Herceptin-related cardiotoxicity monitoring. Stability noted, but continued monitoring essential. - Schedule echocardiogram for June.  Financial concerns Financial constraints due to social security income affecting Medicaid eligibility and medical expense coverage. - Consult social worker for financial assistance options.   All questions were answered. The patient knows to call the clinic with any problems, questions or concerns. We can certainly see the patient much sooner if necessary.  Total encounter time:30 minutes*in face-to-face visit time, chart review, lab review, care coordination, order entry, and documentation of the encounter time.  *Total Encounter Time as defined by the Centers for Medicare and Medicaid Services includes, in addition to the face-to-face time of a patient visit (documented in the note above) non-face-to-face time: obtaining and reviewing outside history, ordering and reviewing medications, tests or procedures, care coordination (communications with other health care professionals or caregivers) and documentation in the medical record.

## 2023-10-20 NOTE — Patient Instructions (Signed)
 [facility] - A DEPT OF [facility]  Discharge Instructions: Thank you for choosing [facility] to provide your oncology and hematology care.   If you have a lab appointment with the Cancer Center, please go directly to the Cancer Center and check in at the registration area.   Wear comfortable clothing and clothing appropriate for easy access to any Portacath or PICC line.   We strive to give you quality time with your provider. You may need to reschedule your appointment if you arrive late (15 or more minutes).  Arriving late affects you and other patients whose appointments are after yours.  Also, if you miss three or more appointments without notifying the office, you may be dismissed from the clinic at the provider's discretion.      For prescription refill requests, have your pharmacy contact our office and allow 72 hours for refills to be completed.    Today you received the following chemotherapy and/or immunotherapy agents: Kanjinti      To help prevent nausea and vomiting after your treatment, we encourage you to take your nausea medication as directed.  BELOW ARE SYMPTOMS THAT SHOULD BE REPORTED IMMEDIATELY: *FEVER GREATER THAN 100.4 F (38 C) OR HIGHER *CHILLS OR SWEATING *NAUSEA AND VOMITING THAT IS NOT CONTROLLED WITH YOUR NAUSEA MEDICATION *UNUSUAL SHORTNESS OF BREATH *UNUSUAL BRUISING OR BLEEDING *URINARY PROBLEMS (pain or burning when urinating, or frequent urination) *BOWEL PROBLEMS (unusual diarrhea, constipation, pain near the anus) TENDERNESS IN MOUTH AND THROAT WITH OR WITHOUT PRESENCE OF ULCERS (sore throat, sores in mouth, or a toothache) UNUSUAL RASH, SWELLING OR PAIN  UNUSUAL VAGINAL DISCHARGE OR ITCHING   Items with * indicate a potential emergency and should be followed up as soon as possible or go to the Emergency Department if any problems should occur.  Please show the CHEMOTHERAPY ALERT CARD or IMMUNOTHERAPY  ALERT CARD at check-in to the Emergency Department and triage nurse.  Should you have questions after your visit or need to cancel or reschedule your appointment, please contact [facility] - A DEPT OF [facility]  Dept: [phone]  and follow the prompts.  Office hours are 8:00 a.m. to 4:30 p.m. Monday - Friday. Please note that voicemails left after 4:00 p.m. may not be returned until the following business day.  We are closed weekends and major holidays. You have access to a nurse at all times for urgent questions. Please call the main number to the clinic Dept: [phone] and follow the prompts.   For any non-urgent questions, you may also contact your provider using MyChart. We now offer e-Visits for anyone 60 and older to request care online for non-urgent symptoms. For details visit [email or web address].   Also download the MyChart app! Go to the app store, search "MyChart", open the app, select [address], and log in with your MyChart username and password.

## 2023-10-20 NOTE — Assessment & Plan Note (Signed)
 [name] is a 73 year old woman with metastatic breast cancer to the lung noted in April 2014 (see oncologic history above).  Breast Cancer Stable on Letrozole and Herceptin. No new symptoms or physical exam findings suggestive of disease progression. -Continue Letrozole and Herceptin as prescribed. -Biannual scan required for metastasis monitoring.  -Discussed financial and pain concerns. No viable scan alternatives. -Consult Child psychotherapist for copay assistance. -Proceed with scan if assistance obtained.  Echocardiogram monitoring Biannual echocardiograms necessary for Herceptin-related cardiotoxicity monitoring. Stability noted, but continued monitoring essential. - Schedule echocardiogram for June.  Financial concerns Financial constraints due to social security income affecting Medicaid eligibility and medical expense coverage. - Consult social worker for financial assistance options.

## 2023-10-21 ENCOUNTER — Inpatient Hospital Stay: Admitting: Licensed Clinical Social Worker

## 2023-10-21 ENCOUNTER — Encounter: Payer: Self-pay | Admitting: Nurse Practitioner

## 2023-10-21 ENCOUNTER — Telehealth (INDEPENDENT_AMBULATORY_CARE_PROVIDER_SITE_OTHER): Payer: 59 | Admitting: Pharmacist

## 2023-10-21 ENCOUNTER — Telehealth: Payer: Self-pay | Admitting: *Deleted

## 2023-10-21 ENCOUNTER — Encounter: Payer: Self-pay | Admitting: *Deleted

## 2023-10-21 ENCOUNTER — Encounter: Payer: Self-pay | Admitting: Hematology and Oncology

## 2023-10-21 DIAGNOSIS — E1149 Type 2 diabetes mellitus with other diabetic neurological complication: Secondary | ICD-10-CM

## 2023-10-21 DIAGNOSIS — Z7985 Long-term (current) use of injectable non-insulin antidiabetic drugs: Secondary | ICD-10-CM | POA: Diagnosis not present

## 2023-10-21 DIAGNOSIS — E119 Type 2 diabetes mellitus without complications: Secondary | ICD-10-CM

## 2023-10-21 NOTE — Telephone Encounter (Signed)
 Message left by pt stating she reviewed her labs and noted elevating liver enzymes " want to know if this is concerning and do we need to stop the chemotherapy ?"  Return phone number given as [phone].  Attempted to call- obtain VM with noted mailbox is full.  This RN noted pt is My Chart active and sent a message to her per concerns,

## 2023-10-21 NOTE — Progress Notes (Unsigned)
 S:     Chief Complaint  Patient presents with   Medication Management    Diabetes - Follow-up   73 y.o. female contacted for virtual video visit at her request for diabetes evaluation, education, and management. Patient is in good spirits and was delights to see her previous PCP, Dr. Deirdre Priest who was in our office today and able to say hello prior to my visit from my office.  Patient joined from her home.   Patient was referred and last seen by Primary Care Provider, Dr. Deirdre Priest, on 05/05/2023.   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  At last visit, patient was experiencing low readings and her insulin regimen was reduced.   Current diabetes medications include:  - Lantus (insulin glargine) 14 units once daily.  - Humalog Kwikpen (insulin lispro) 8- and rarely 10units, mostly twice daily with meals (rare 3rd meal). - Ozempic (semaglutide) 2mg  once weekly.    Patient reports adherence to taking above medications as prescribed.    Do you feel that your medications are working for you? yes Have you been experiencing any side effects to the medications prescribed? no Do you have any problems obtaining medications due to transportation or finances? no Insurance coverage: Administrator   O:   Review of Systems  Respiratory:  Positive for shortness of breath (currently reports 4L oxygen at home).   All other systems reviewed and are negative.   Physical Exam Neurological:     Mental Status: She is alert.  Psychiatric:        Mood and Affect: Mood normal.        Behavior: Behavior normal.        Thought Content: Thought content normal.        Judgment: Judgment normal.     Libre3 CGM Download today 10/21/2023 % Time CGM is active: 72% Average Glucose: 185 mg/dL Glucose Management Indicator: 7.7  Glucose Variability: 18.7% (goal <36%) Time in Goal:  - Time in range 70-180: 54% - Time above range: 46% - Time below range: 0% Observed  patterns:   Lab Results  Component Value Date   HGBA1C 6.4 05/05/2023     Lipid Panel     Component Value Date/Time   CHOL 150 11/08/2019 1344   TRIG 107 04/30/2020 1626   HDL 66 11/08/2019 1344   CHOLHDL 2.3 11/08/2019 1344   CHOLHDL 2.5 10/27/2012 1653   VLDL 30 10/27/2012 1653   LDLCALC 60 11/08/2019 1344      A/P: Diabetes fairly well controlled based on CGM with no low blood sugar readings or hypoglycemic symptoms however higher readings than previous and patient is requesting adjustment to allow her to eat more and not wait as long for her glucose to return to a level where she is comfortable with consuming more calories. Patient is able to verbalize appropriate hypoglycemia management plan. Medication adherence appears good.  Continue current regimen with no adjustment.  CGM is helping to control diet and medication dosing.  - Increase Lantus (insulin glargine) from 14 to 16 units daily.  - Recommended more use of Humalog Kwikpen (insulin lispro) 8-10units with meals, encouraged the higher dose with high carbohydrate meals - Continue Ozempic (semaglutide) 2mg  once weekly.  -Counseled on s/sx of and management of hypoglycemia.  -Next A1c anticipated at upcoming in-person visit with new PCP, Dr. Ardyth Harps in April or early May - A1C planned for that in-person visit.    Written patient instructions provided. Patient  verbalized understanding of treatment plan.  Total time in face to face counseling 22 minutes.    Follow-up:  Pharmacist PRN PCP clinic visit in 1-2 months after cold/flu season due to her preference to avoid infection

## 2023-10-21 NOTE — Patient Instructions (Signed)
 GREAT SEEING YOU TODAY.  I am happy you could see Dr. Salena Saner as well.   - Increase Lantus (insulin glargine) from 14 to 16 units daily  - Recommended more use of Humalog Kwikpen (insulin lispro) 8-10units with meals, encouraged the higher dose with high carbohydrate meals - Continue Ozempic (semaglutide) 2mg  once weekly

## 2023-10-21 NOTE — Progress Notes (Signed)
 [facility] CSW Progress Note  Clinical Child psychotherapist contacted patient by phone per referral from Dr. Al Pimple regarding difficulty paying co-pay for scans. Per pt, money is tight as she is on Tree [employer] and still raising her grandson. Daughter in the home works a little, but it is limited.  They receive food stamps, but pt cannot qualify for Medicaid.  CSW discussed options through cancer foundations.  Submitted application to [employer] today.  Mailed [place] [employer] [place] application to patient for her to complete (signatures, social security income information) and mail or bring back to CSW.   CSW offered information on food pantries, but patient stated she can't get to them and daughter works nights so won't be able to go. CSW will provide bag from [facility] food pantry when pt comes to next appt.    Jhamal Plucinski E Terilyn Sano, LCSW Clinical Social Worker [facility]

## 2023-10-21 NOTE — Assessment & Plan Note (Signed)
 Diabetes fairly well controlled based on CGM with no low blood sugar readings or hypoglycemic symptoms however higher readings than previous and patient is requesting adjustment to allow her to eat more and not wait as long for her glucose to return to a level where she is comfortable with consuming more calories. Patient is able to verbalize appropriate hypoglycemia management plan. Medication adherence appears good.  Continue current regimen with no adjustment.  CGM is helping to control diet and medication dosing.  - Increase Lantus (insulin glargine) from 14 to 16 units daily.  - Recommended more use of Humalog Kwikpen (insulin lispro) 8-10units with meals, encouraged the higher dose with high carbohydrate meals - Continue Ozempic (semaglutide) 2mg  once weekly.

## 2023-10-25 DIAGNOSIS — E1149 Type 2 diabetes mellitus with other diabetic neurological complication: Secondary | ICD-10-CM | POA: Diagnosis not present

## 2023-10-26 NOTE — Progress Notes (Signed)
 Reviewed and agree with Dr Macky Lower plan.

## 2023-10-27 ENCOUNTER — Ambulatory Visit: Payer: 59 | Admitting: Hematology and Oncology

## 2023-10-27 ENCOUNTER — Other Ambulatory Visit: Payer: 59

## 2023-10-27 ENCOUNTER — Ambulatory Visit: Payer: 59

## 2023-11-09 ENCOUNTER — Encounter: Payer: Self-pay | Admitting: Licensed Clinical Social Worker

## 2023-11-09 NOTE — Progress Notes (Signed)
 [facility] CSW Progress Note  Clinical Child psychotherapist received notification that pt was approved for grant assistance through [employer] and that payment is in progress.    Gideon Burstein E Baylin Gamblin, LCSW Clinical Social Worker [facility]

## 2023-11-13 ENCOUNTER — Other Ambulatory Visit: Payer: Self-pay | Admitting: Family Medicine

## 2023-11-13 DIAGNOSIS — E1149 Type 2 diabetes mellitus with other diabetic neurological complication: Secondary | ICD-10-CM

## 2023-11-17 ENCOUNTER — Inpatient Hospital Stay (HOSPITAL_BASED_OUTPATIENT_CLINIC_OR_DEPARTMENT_OTHER): Payer: 59 | Admitting: Hematology and Oncology

## 2023-11-17 ENCOUNTER — Encounter: Payer: Self-pay | Admitting: Hematology and Oncology

## 2023-11-17 ENCOUNTER — Encounter: Payer: Self-pay | Admitting: Licensed Clinical Social Worker

## 2023-11-17 ENCOUNTER — Encounter: Payer: Self-pay | Admitting: Nurse Practitioner

## 2023-11-17 ENCOUNTER — Inpatient Hospital Stay: Payer: 59 | Attending: Oncology

## 2023-11-17 ENCOUNTER — Inpatient Hospital Stay (HOSPITAL_BASED_OUTPATIENT_CLINIC_OR_DEPARTMENT_OTHER): Admitting: Nurse Practitioner

## 2023-11-17 VITALS — BP 157/75 | HR 75 | Temp 97.6°F | Resp 18 | Wt 301.1 lb

## 2023-11-17 DIAGNOSIS — Z923 Personal history of irradiation: Secondary | ICD-10-CM | POA: Diagnosis not present

## 2023-11-17 DIAGNOSIS — Z8041 Family history of malignant neoplasm of ovary: Secondary | ICD-10-CM | POA: Insufficient documentation

## 2023-11-17 DIAGNOSIS — C7800 Secondary malignant neoplasm of unspecified lung: Secondary | ICD-10-CM

## 2023-11-17 DIAGNOSIS — C7802 Secondary malignant neoplasm of left lung: Secondary | ICD-10-CM | POA: Diagnosis not present

## 2023-11-17 DIAGNOSIS — Z170 Estrogen receptor positive status [ER+]: Secondary | ICD-10-CM | POA: Diagnosis not present

## 2023-11-17 DIAGNOSIS — Z87891 Personal history of nicotine dependence: Secondary | ICD-10-CM | POA: Diagnosis not present

## 2023-11-17 DIAGNOSIS — Z79811 Long term (current) use of aromatase inhibitors: Secondary | ICD-10-CM | POA: Diagnosis not present

## 2023-11-17 DIAGNOSIS — C50212 Malignant neoplasm of upper-inner quadrant of left female breast: Secondary | ICD-10-CM

## 2023-11-17 DIAGNOSIS — F419 Anxiety disorder, unspecified: Secondary | ICD-10-CM

## 2023-11-17 DIAGNOSIS — E1140 Type 2 diabetes mellitus with diabetic neuropathy, unspecified: Secondary | ICD-10-CM | POA: Insufficient documentation

## 2023-11-17 DIAGNOSIS — C50919 Malignant neoplasm of unspecified site of unspecified female breast: Secondary | ICD-10-CM

## 2023-11-17 DIAGNOSIS — G893 Neoplasm related pain (acute) (chronic): Secondary | ICD-10-CM | POA: Diagnosis not present

## 2023-11-17 DIAGNOSIS — R112 Nausea with vomiting, unspecified: Secondary | ICD-10-CM | POA: Insufficient documentation

## 2023-11-17 DIAGNOSIS — Z5112 Encounter for antineoplastic immunotherapy: Secondary | ICD-10-CM | POA: Diagnosis not present

## 2023-11-17 DIAGNOSIS — Z515 Encounter for palliative care: Secondary | ICD-10-CM

## 2023-11-17 DIAGNOSIS — M792 Neuralgia and neuritis, unspecified: Secondary | ICD-10-CM

## 2023-11-17 DIAGNOSIS — K5903 Drug induced constipation: Secondary | ICD-10-CM

## 2023-11-17 DIAGNOSIS — G8929 Other chronic pain: Secondary | ICD-10-CM | POA: Insufficient documentation

## 2023-11-17 DIAGNOSIS — Z803 Family history of malignant neoplasm of breast: Secondary | ICD-10-CM | POA: Insufficient documentation

## 2023-11-17 DIAGNOSIS — C7801 Secondary malignant neoplasm of right lung: Secondary | ICD-10-CM | POA: Insufficient documentation

## 2023-11-17 DIAGNOSIS — M549 Dorsalgia, unspecified: Secondary | ICD-10-CM | POA: Diagnosis not present

## 2023-11-17 MED ORDER — DIAZEPAM 5 MG PO TABS: 5.0000 mg | ORAL_TABLET | Freq: Two times a day (BID) | ORAL | 0 refills | Status: DC | PRN

## 2023-11-17 MED ORDER — LORAZEPAM 2 MG/ML IJ SOLN
0.5000 mg | Freq: Once | INTRAMUSCULAR | Status: AC
  Administered 2023-11-17: 0.5 mg via INTRAVENOUS
  Filled 2023-11-17: qty 1

## 2023-11-17 MED ORDER — SODIUM CHLORIDE 0.9 % IV SOLN: Freq: Once | INTRAVENOUS | Status: AC

## 2023-11-17 MED ORDER — ACETAMINOPHEN 325 MG PO TABS
650.0000 mg | ORAL_TABLET | Freq: Once | ORAL | Status: AC
  Administered 2023-11-17: 650 mg via ORAL
  Filled 2023-11-17: qty 2

## 2023-11-17 MED ORDER — GABAPENTIN 300 MG PO CAPS: 300.0000 mg | ORAL_CAPSULE | Freq: Every day | ORAL | 3 refills | Status: DC

## 2023-11-17 MED ORDER — TRASTUZUMAB-ANNS CHEMO 150 MG IV SOLR
750.0000 mg | Freq: Once | INTRAVENOUS | Status: AC
  Administered 2023-11-17: 750 mg via INTRAVENOUS
  Filled 2023-11-17: qty 35.71

## 2023-11-17 MED ORDER — SODIUM CHLORIDE 0.9% FLUSH
10.0000 mL | INTRAVENOUS | Status: DC | PRN
Start: 2023-11-17 — End: 2023-11-17
  Administered 2023-11-17: 10 mL

## 2023-11-17 MED ORDER — HEPARIN SOD (PORK) LOCK FLUSH 100 UNIT/ML IV SOLN
500.0000 [IU] | Freq: Once | INTRAVENOUS | Status: AC | PRN
  Administered 2023-11-17: 500 [IU]

## 2023-11-17 MED ORDER — DIPHENHYDRAMINE HCL 25 MG PO CAPS
50.0000 mg | ORAL_CAPSULE | Freq: Once | ORAL | Status: AC
Start: 2023-11-17 — End: 2023-11-17
  Administered 2023-11-17: 50 mg via ORAL
  Filled 2023-11-17: qty 2

## 2023-11-17 NOTE — Progress Notes (Signed)
 Palliative Medicine [facility]  [phone] [phone]   Name: [name] Date: 11/17/2023 MRN: [id number]  DOB: 1950-11-26  Patient Care Team: Elberta Fortis, MD as PCP - General (Family Medicine) Laurey Morale, MD as Consulting Physician (Cardiology) Rachel Moulds, MD as Consulting Physician (Hematology and Oncology)    INTERVAL HISTORY: [name] is a 73 y.o. female with medical history including metastatic breast cancer with numerous pulmonary nodules bilaterally s/p chemotherapy currently on herceptin for maintenance. Now with complaints of uncontrolled back pain.  Palliative ask to see for symptom management.    SOCIAL HISTORY:     reports that she quit smoking about 55 years ago. Her smoking use included cigarettes. She started smoking about 60 years ago. She has a 15 pack-year smoking history. She has been exposed to tobacco smoke. She has never used smokeless tobacco. She reports current drug use. Drug: Marijuana. She reports that she does not drink alcohol.  ADVANCE DIRECTIVES:  None on file  CODE STATUS: Full code  PAST MEDICAL HISTORY: Past Medical History:  Diagnosis Date   Arthritis    Back pain    Breast cancer (HCC) dx'd 11/2012   left   Chest pain    COPD (chronic obstructive pulmonary disease) (HCC)    Diabetes mellitus without complication (HCC) 09/28/2014   Ear pain    Fatty liver 6/03   Hypertension    Lung disease    Lung metastases dx'd 11/2012   Obesity, unspecified    Other abnormal glucose    Suicide attempt (HCC) 1996   Syncope and collapse    Unspecified sleep apnea     ALLERGIES:  is allergic to meperidine hcl, penicillins, and aspirin.  MEDICATIONS:  Current Outpatient Medications  Medication Sig Dispense Refill   albuterol (VENTOLIN HFA) 108 (90 Base) MCG/ACT inhaler INHALE 2 PUFFS INTO THE LUNGS EVERY 6 HOURS AS NEEDED FOR WHEEZE 8.5 each 1   B-D ULTRAFINE III SHORT PEN 31G X 8 MM  MISC PLEASE USE TO INJECT INSULIN 4 TIMES DAILY. 200 each 3   Blood Glucose Monitoring Suppl (ONE TOUCH ULTRA 2) w/Device KIT 1 kit by Does not apply route QID. ICD 10-code: E11.49. 1 kit 0   gabapentin (NEURONTIN) 300 MG capsule Take 1 capsule (300 mg total) by mouth at bedtime. 30 capsule 3   insulin glargine (LANTUS SOLOSTAR) 100 UNIT/ML Solostar Pen Inject 14 Units into the skin daily. 3 mL 0   insulin lispro (HUMALOG) 100 UNIT/ML KwikPen Inject 8-10 Units into the skin 3 (three) times daily. 3 mL 3   Insulin Pen Needle (PEN NEEDLES) 30G X 8 MM MISC Please use to inject insulin 4 times daily. 200 each 3   letrozole (FEMARA) 2.5 MG tablet TAKE 1 TABLET BY MOUTH EVERY DAY 90 tablet 12   loperamide (IMODIUM) 2 MG capsule TAKE 1 CAPSULE (2 MG TOTAL) BY MOUTH AS NEEDED FOR DIARRHEA OR LOOSE STOOLS. 30 capsule 1   LORazepam (ATIVAN) 0.5 MG tablet Take 1 tablet (0.5 mg total) by mouth at bedtime as needed for anxiety. 20 tablet 0   ondansetron (ZOFRAN) 8 MG tablet TAKE 1 TABLET BY MOUTH EVERY 8 HOURS AS NEEDED 20 tablet 2   ONETOUCH ULTRA test strip USE AS INSTRUCTED TO CHECK ONCE DAILY 100 strip 12   Oxycodone HCl 10 MG TABS Take 1 tablet (10 mg total) by mouth every 6 (six) hours as needed. 60 tablet 0   OXYGEN Inhale  into the lungs.     polyethylene glycol powder (MIRALAX) 17 GM/SCOOP powder Take 1 capful twice daily. 238 g 6   Semaglutide, 2 MG/DOSE, (OZEMPIC, 2 MG/DOSE,) 8 MG/3ML SOPN Inject 2 mg into the skin once a week. 3 mL 1   No current facility-administered medications for this visit.   Facility-Administered Medications Ordered in Other Visits  Medication Dose Route Frequency Provider Last Rate Last Admin   heparin lock flush 100 unit/mL  500 Units Intracatheter Once PRN Iruku, Praveena, MD       sodium chloride flush (NS) 0.9 % injection 10 mL  10 mL Intracatheter PRN Iruku, Praveena, MD        VITAL SIGNS: There were no vitals taken for this visit. There were no vitals filed for  this visit.  Estimated body mass index is 51.68 kg/m as calculated from the following:   Height as of 10/20/23: 5\' 4"  (1.626 m).   Weight as of an earlier encounter on 11/17/23: 301 lb 1.6 oz (136.6 kg).   PERFORMANCE STATUS (ECOG) : 1 - Symptomatic but completely ambulatory  Physical Exam General: NAD Cardiovascular: regular rate and rhythm Pulmonary: normal breathing pattern Extremities: no edema, no joint deformities Skin: no rashes Neurological: AAO x3  IMPRESSION: Discussed the use of AI scribe software for clinical note transcription with the patient, who gave verbal consent to proceed.  History of Present Illness I saw [name] during her infusion.  Tolerating well.  No family present.  Expresses concerns for ongoing back pain and anxiety denies concerns of uncontrolled nausea, vomiting, constipation, or diarrhea.  Feels her nausea is controlled with medication as needed in addition to her constipation which has also been a challenge.  She experiences episodes of dizziness and near syncope when lying down and getting up, describing it as 'almost passing out'. She also reports a sensation of her heart beating when she gets up and walks.  Reports several falls due to weakness and back pain which comes out of nowhere.  [name] states she is experiencing severe, "unbearable" back pain with a history of sciatic nerve pain. The pain has worsened over the past several weeks.  She was previously taking oxycodone however has discontinued due to minimal relief and contributing to her constipation challenges.  She reports movement, such as getting up and walking, exacerbates the pain, while applying heat to her spine provides some relief.  In the past patient previously took pregabalin and gabapentin for her pain.  After taking pregabalin for several years she felt no improvement pain began to escalate resulting in discontinuation of medication.  Patient seen by Dr. Al Pimple today and started  on gabapentin 300 mg at bedtime.  She plans to start medication over the next 24 hours with understanding dose can be titrated as needed depending on ability to tolerate and effectiveness.  Patient also with some level of insomnia and anxiety.  Over the past year she has taken Ativan and Valium.  She reports today that she feels Valium is the most effective.  Will continue Valium at 5 mg twice daily as needed with no intentions of deferring back to Ativan in the near future.  All questions answered and support provided.   I discussed the importance of continued conversation with family and their medical providers regarding overall plan of care and treatment options, ensuring decisions are within the context of the patients values and GOCs. Assessment & Plan Chronic back pain Experiences severe, unbearable spinal pain, likely  neuropathic, possibly sciatica. Pain worsened after discontinuing oxycodone which she reported minimal relief, increase constipation.  Patient previously on oxycodone/OxyContin for several years previously. Current medications are ineffective, impacting quality of life. Discussed with her oncologist about restarting gabapentin.  Gabapentin will be titrated based on tolerance to enhance pain management. - Initiate gabapentin at 300 mg, one capsule daily per Dr. Al Pimple. - Discontinue oxycodone.  Last refill in February. - Follow-up call in one week to assess pain control and medication tolerance.  Nausea and Vomiting Experiences nausea and vomiting, potentially medication-related. Reports running out of Zofran due to insurance limitations. - Notify clinic when in need of Zofran tablets for reauthorization to prevent depletion.  Anxiety/insomnia Patient reports anxiety is better controlled with the Valium.  Discontinue Ativan use.  Valium may also assist with patient's lower back pain. -Valium 5 mg twice daily as needed.  Follow-up - Follow-up call in one week to check on  progress and medication tolerance. -See patient back in the clinic in 4-6 weeks.  Sooner if needed.  Patient expressed understanding and was in agreement with this plan. She also understands that She can call the clinic at any time with any questions, concerns, or complaints.   Any controlled substances utilized were prescribed in the context of palliative care. PDMP has been reviewed.   Visit consisted of counseling and education dealing with the complex and emotionally intense issues of symptom management and palliative care in the setting of serious and potentially life-threatening illness.  [name], AGPCNP-BC  Palliative Medicine Team/[facility]

## 2023-11-17 NOTE — Progress Notes (Signed)
  Cancer Center Cancer Follow up:    Jenna Fortis, MD [address] 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 (HCC)  12/14/2012 Initial Diagnosis   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 (HCC)   11/20/2022 -  Chemotherapy   Patient is on Treatment Plan : BREAST MAINTENANCE Trastuzumab flat dose 750 mg every 28 days     Malignant neoplasm of upper-inner quadrant of left breast in female, estrogen receptor positive (HCC)  11/17/2012 Cancer Staging   Staging form: Breast, AJCC 7th Edition - Pathologic: Stage IV (M1) - Signed by Rachel Moulds, MD on 11/17/2023 Specimen type: Core Needle Biopsy Histopathologic type: 9931 Laterality: Left   12/06/2012 - 10/23/2022 Chemotherapy   Patient is on Treatment Plan : BREAST Herceptin q28d per Dr. Darnelle Catalan     05/23/2013 Initial Diagnosis   Malignant neoplasm of upper-inner quadrant of left breast in female, estrogen receptor positive (HCC)     CURRENT THERAPY: herceptin/Letrozole  INTERVAL HISTORY:  Discussed the use of AI scribe software for clinical note transcription with the patient, who gave verbal consent to proceed.  [name] 73 y.o. female returns for f/u of her breast cancer on treatment with Letrozole and herceptin  [name] is a 73 year old female with chronic back pain who presents with severe pain and nausea.   She experiences severe back pain radiating from her  lower back to her neck, similar to pain felt during previous radiation treatments. The pain is intense, causing her to pass out twice, once while returning from the bathroom. Her grandson and daughter assisted her during these episodes. She uses a lift chair to manage the pain and avoid strain on her spine.  She has a long history of back pain since her twenties. Oxycontin was previously effective but discontinued due to liver issues. 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 She can only walk about 26 steps before needing to sit, and standing for more than one to two minutes exacerbates her pain. She has tried various pain management strategies, including back injections and physical therapy, without success.  She is unable to tolerate Oxycontin due to severe nausea and vomiting, which also exacerbates her back pain. She has tried morphine in the past, which helped with the pain but she is concerned it might make her sick. She is allergic to Percocet, which causes severe gastrointestinal distress. 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  She experiences neuropathy in her feet and legs, likely related to diabetes. She requests a refill for neurontin.  She has tried cannabis products, including syrup, which helps her sleep but does not alleviate her pain. She experiences fluctuations in bowel movements, with both constipation and diarrhea exacerbating her back pain.   Rest of the pertinent 10 point ROS reviewed and neg.  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  MEDICAL HISTORY: Past Medical History:  Diagnosis Date   Arthritis    Back pain    Breast cancer (HCC) dx'd 11/2012   left   Chest pain    COPD (chronic obstructive pulmonary disease) (HCC)    Diabetes mellitus without complication (HCC) 09/28/2014   Ear pain    Fatty liver 6/03   Hypertension    Lung disease    Lung metastases dx'd 11/2012   Obesity, unspecified    Other abnormal glucose    Suicide attempt (HCC) 1996   Syncope and collapse    Unspecified sleep apnea     SURGICAL HISTORY: Past Surgical History:  Procedure Laterality Date   CARDIAC CATHETERIZATION     2007   CHOLECYSTECTOMY     TUBAL LIGATION      SOCIAL HISTORY: Social History   Socioeconomic History   Marital status: Divorced    Spouse name: Not on file   Number of children: 4   Years of education: 12   Highest education level: 12th grade  Occupational History   Occupation: Disability   Occupation: retired-daycare  Tobacco Use   Smoking status: Former    Current packs/day: 0.00    Average packs/day: 3.0 packs/day for 5.0 years (15.0 ttl pk-yrs)    Types: Cigarettes    Start date: 08/19/1963    Quit date: 08/18/1968    Years since quitting: 55.2    Passive exposure: Past   Smokeless tobacco: Never  Vaping Use   Vaping status: Never Used  Substance and Sexual Activity   Alcohol use: No    Alcohol/week: 0.0 standard drinks of alcohol   Drug use: Yes    Types: Marijuana    Comment: Medical Marijuana   Sexual activity: Not Currently    Partners: Male  Other Topics Concern   Not on file  Social History  Narrative   Health Care POA:    Emergency Contact: Amador Cunas [phone]   End of Life Plan:    Who lives with you: two grandchildren, friend, daughter   Any pets: 3 poodles   Diet: Pt has a variety of protein, starch, vegetables.  Pt is currently working on cutting back on portions for weight loss.   Exercise: Pt has not regular exercise routine.  Occasionally walks around home.   Seatbelts: Pt reports wearing seatbelt occasionally.   Sun Exposure/Protection: Pt does not use sun protection   Hobbies: reading, playing on kindle, ebay         Currently in her home she keeps her granddaughter [name], 16, who is the daughter of the patient's daughter Para March (the patient refers to [name] as "my adopted daughter"); grandson Leonardo "[name]" Strack, [place], who is [name]'s half-brother; daughter [name], and an [place] friend, [name], the patient's significant other. Daughter [name] is a Public librarian, currently unemployed. Son [name] "  Physiological scientist works as an Personnel officer in [place]. Daughter Para March is currently in prison due to killing someone in a car accident. Daughter [name] died from aplastic anemia at the age of 34. The patient has a total of 4 grandchildren. She is not a church attender   Social Drivers of Health   Financial Resource Strain: High Risk (05/02/2023)   Overall Financial Resource Strain (CARDIA)    Difficulty of Paying Living Expenses: Very hard  Food Insecurity: Food Insecurity Present (11/17/2023)   Hunger Vital Sign    Worried About Running Out of Food in the Last Year: Often true    Ran Out of Food in the Last Year: Sometimes true  Transportation Needs: Unmet Transportation Needs (05/02/2023)   PRAPARE - Administrator, Civil Service (Medical): Yes    Lack of Transportation (Non-Medical): Yes  Physical Activity: Unknown (05/02/2023)   Exercise Vital Sign    Days of Exercise per Week: Patient declined    Minutes of Exercise per  Session: Not on file  Stress: Stress Concern Present (05/02/2023)   [employer] of Occupational Health - Occupational Stress Questionnaire    Feeling of Stress : To some extent  Social Connections: Socially Isolated (05/02/2023)   Social Connection and Isolation Panel [NHANES]    Frequency of Communication with Friends and Family: Twice a week    Frequency of Social Gatherings with Friends and Family: Never    Attends Religious Services: Never    Database administrator or Organizations: No    Attends Engineer, structural: Not on file    Marital Status: Widowed  Intimate Partner Violence: Not At Risk (07/30/2022)   Humiliation, Afraid, Rape, and Kick questionnaire    Fear of Current or Ex-Partner: No    Emotionally Abused: No    Physically Abused: No    Sexually Abused: No    FAMILY HISTORY: Family History  Problem Relation Age of Onset   Coronary artery disease Father 73   Diabetes Father    Heart disease Father    Breast cancer Mother 90   Cancer Mother 65       breast   Aplastic anemia Daughter        died at age 66   Cancer Maternal Aunt 47       ovarian   Cancer Maternal Grandmother 67       ovarian   Cancer Paternal Aunt 55       ovarian/breast/breast   Coronary artery disease Sister 56   Coronary artery disease Brother 56    PHYSICAL EXAMINATION    Vitals:   11/17/23 1428  BP: (!) 157/75  Pulse: 75  Resp: 18  Temp: 97.6 F (36.4 C)  SpO2: 90%     Physical Exam Constitutional:      General: She is not in acute distress.    Appearance: Normal appearance. She is not toxic-appearing.     Comments: Examined in wheelchair  HENT:     Head: Normocephalic and atraumatic.     Mouth/Throat:     Mouth: Mucous membranes are moist.     Pharynx: Oropharynx is clear. No oropharyngeal exudate or posterior oropharyngeal erythema.  Eyes:     General: No scleral icterus. Cardiovascular:     Rate and Rhythm: Normal rate and regular rhythm.      Pulses: Normal pulses.     Heart sounds: Normal heart sounds.  Pulmonary:     Effort: Pulmonary effort is normal.  Breath sounds: Normal breath sounds.  Abdominal:     General: Abdomen is flat.     Palpations: Abdomen is soft.     Tenderness: There is no abdominal tenderness.     Comments: Limited exam due to body habitus and exam in wheelchair  Musculoskeletal:        General: Swelling (Bilateral LE swelling symmetric) present.     Cervical back: Neck supple.  Lymphadenopathy:     Cervical: No cervical adenopathy.  Skin:    General: Skin is warm and dry.     Findings: No rash.  Neurological:     General: No focal deficit present.     Mental Status: She is alert.  Psychiatric:        Mood and Affect: Mood normal.        Behavior: Behavior normal.       ASSESSMENT and THERAPY PLAN:   Malignant neoplasm of upper-inner quadrant of left breast in female, estrogen receptor positive (HCC) Breast cancer Breast cancer treated with radiation therapy, currently on letrozole.  Pain in the center of the back up to the neck , she has chronic pain but may have been exacerbated by positioning during radiation. Scheduled for scans in August to monitor cancer status. - Continue herceptin and  letrozole as prescribed. - Schedule scans in August to monitor cancer status.  Chronic back pain Chronic back pain exacerbated by movement and certain pain medications. Pain is severe, rated 7-9/10, persistent for over a month. Experiences nausea and vomiting with Oxycontin and other opioids, exacerbating the pain. Previous treatments, including back injections and physical therapy, have been ineffective or intolerable. Unable to tolerate opioids due to severe nausea and vomiting. Prefers to avoid opioids and invasive treatments due to past negative experiences and concerns about exacerbating her condition. She requests a refill for gabapentin - Prescribe gabapentin 300 mg at night for neuropathic  pain. Increase dose as tolerated.  Follow-up Follow-up plans for ongoing management and monitoring of conditions. Heart ultrasound scheduled for May 27th, needs to be performed in a comfortable setting to avoid exacerbating back pain. -continue monthly herceptin. -We have on several occasions discussed about scans and she is reluctant. She understands the cancer could be progressing and not responding to current regimen but once again her severe pain is limiting her ability to do scans. - Follow up in a couple of months for reassessment.    All questions were answered. The patient knows to call the clinic with any problems, questions or concerns. We can certainly see the patient much sooner if necessary.  Total encounter time:30 minutes*in face-to-face visit time, chart review, lab review, care coordination, order entry, and documentation of the encounter time.  *Total Encounter Time as defined by the Centers for Medicare and Medicaid Services includes, in addition to the face-to-face time of a patient visit (documented in the note above) non-face-to-face time: obtaining and reviewing outside history, ordering and reviewing medications, tests or procedures, care coordination (communications with other health care professionals or caregivers) and documentation in the medical record.

## 2023-11-17 NOTE — Patient Instructions (Signed)

## 2023-11-17 NOTE — Assessment & Plan Note (Signed)
 Breast cancer Breast cancer treated with radiation therapy, currently on letrozole.  Pain in the center of the back up to the neck , she has chronic pain but may have been exacerbated by positioning during radiation. Scheduled for scans in August to monitor cancer status. - Continue herceptin and  letrozole as prescribed. - Schedule scans in August to monitor cancer status.  Chronic back pain Chronic back pain exacerbated by movement and certain pain medications. Pain is severe, rated 7-9/10, persistent for over a month. Experiences nausea and vomiting with Oxycontin and other opioids, exacerbating the pain. Previous treatments, including back injections and physical therapy, have been ineffective or intolerable. Unable to tolerate opioids due to severe nausea and vomiting. Prefers to avoid opioids and invasive treatments due to past negative experiences and concerns about exacerbating her condition. She requests a refill for gabapentin - Prescribe gabapentin 300 mg at night for neuropathic pain. Increase dose as tolerated.  Follow-up Follow-up plans for ongoing management and monitoring of conditions. Heart ultrasound scheduled for May 27th, needs to be performed in a comfortable setting to avoid exacerbating back pain. -continue monthly herceptin. -We have on several occasions discussed about scans and she is reluctant. She understands the cancer could be progressing and not responding to current regimen but once again her severe pain is limiting her ability to do scans. - Follow up in a couple of months for reassessment.

## 2023-11-18 ENCOUNTER — Other Ambulatory Visit: Payer: Self-pay | Admitting: Family Medicine

## 2023-11-18 ENCOUNTER — Inpatient Hospital Stay: Admitting: Licensed Clinical Social Worker

## 2023-11-18 DIAGNOSIS — E1149 Type 2 diabetes mellitus with other diabetic neurological complication: Secondary | ICD-10-CM

## 2023-11-18 NOTE — Progress Notes (Signed)
 [facility] CSW Progress Note  Patient brought in [employer] in [place] application. CSW submitted application and supporting documents. [employer] in [place] will notify pt if approved for assistance.    [name] E Jenna Chestnutt, LCSW Clinical Social Worker [facility]

## 2023-11-19 ENCOUNTER — Other Ambulatory Visit (HOSPITAL_COMMUNITY): Payer: Self-pay

## 2023-11-19 ENCOUNTER — Encounter: Payer: Self-pay | Admitting: Hematology and Oncology

## 2023-11-19 ENCOUNTER — Other Ambulatory Visit: Payer: Self-pay

## 2023-11-19 MED ORDER — OZEMPIC (2 MG/DOSE) 8 MG/3ML ~~LOC~~ SOPN
2.0000 mg | PEN_INJECTOR | SUBCUTANEOUS | 1 refills | Status: DC
  Filled 2023-11-19: qty 3, 28d supply, fill #0
  Filled 2023-12-15: qty 3, 28d supply, fill #1

## 2023-11-24 ENCOUNTER — Encounter: Payer: Self-pay | Admitting: Hematology and Oncology

## 2023-11-25 ENCOUNTER — Inpatient Hospital Stay: Admitting: Nurse Practitioner

## 2023-11-25 DIAGNOSIS — E1149 Type 2 diabetes mellitus with other diabetic neurological complication: Secondary | ICD-10-CM | POA: Diagnosis not present

## 2023-12-02 ENCOUNTER — Encounter: Payer: Self-pay | Admitting: Licensed Clinical Social Worker

## 2023-12-02 NOTE — Progress Notes (Signed)
 [facility] CSW Progress Note  Visual merchandiser received notification that pt was approved for assistance from [employer] in [place]. They have notified pt directly.    Trinh Sanjose E Shontae Rosiles, LCSW Clinical Social Worker [facility]

## 2023-12-14 ENCOUNTER — Other Ambulatory Visit: Payer: Self-pay | Admitting: *Deleted

## 2023-12-14 DIAGNOSIS — C50919 Malignant neoplasm of unspecified site of unspecified female breast: Secondary | ICD-10-CM

## 2023-12-15 ENCOUNTER — Encounter: Payer: Self-pay | Admitting: Nurse Practitioner

## 2023-12-15 ENCOUNTER — Inpatient Hospital Stay

## 2023-12-15 ENCOUNTER — Other Ambulatory Visit (HOSPITAL_COMMUNITY): Payer: Self-pay

## 2023-12-15 ENCOUNTER — Inpatient Hospital Stay (HOSPITAL_BASED_OUTPATIENT_CLINIC_OR_DEPARTMENT_OTHER): Admitting: Nurse Practitioner

## 2023-12-15 VITALS — BP 140/57 | HR 60 | Temp 98.3°F | Resp 18

## 2023-12-15 DIAGNOSIS — M792 Neuralgia and neuritis, unspecified: Secondary | ICD-10-CM

## 2023-12-15 DIAGNOSIS — Z87891 Personal history of nicotine dependence: Secondary | ICD-10-CM | POA: Diagnosis not present

## 2023-12-15 DIAGNOSIS — Z803 Family history of malignant neoplasm of breast: Secondary | ICD-10-CM | POA: Diagnosis not present

## 2023-12-15 DIAGNOSIS — C50919 Malignant neoplasm of unspecified site of unspecified female breast: Secondary | ICD-10-CM

## 2023-12-15 DIAGNOSIS — M549 Dorsalgia, unspecified: Secondary | ICD-10-CM | POA: Diagnosis not present

## 2023-12-15 DIAGNOSIS — C7802 Secondary malignant neoplasm of left lung: Secondary | ICD-10-CM | POA: Diagnosis not present

## 2023-12-15 DIAGNOSIS — G893 Neoplasm related pain (acute) (chronic): Secondary | ICD-10-CM | POA: Diagnosis not present

## 2023-12-15 DIAGNOSIS — E1140 Type 2 diabetes mellitus with diabetic neuropathy, unspecified: Secondary | ICD-10-CM | POA: Diagnosis not present

## 2023-12-15 DIAGNOSIS — G8929 Other chronic pain: Secondary | ICD-10-CM | POA: Diagnosis not present

## 2023-12-15 DIAGNOSIS — Z5112 Encounter for antineoplastic immunotherapy: Secondary | ICD-10-CM | POA: Diagnosis not present

## 2023-12-15 DIAGNOSIS — C7800 Secondary malignant neoplasm of unspecified lung: Secondary | ICD-10-CM | POA: Diagnosis not present

## 2023-12-15 DIAGNOSIS — Z79811 Long term (current) use of aromatase inhibitors: Secondary | ICD-10-CM | POA: Diagnosis not present

## 2023-12-15 DIAGNOSIS — Z170 Estrogen receptor positive status [ER+]: Secondary | ICD-10-CM | POA: Diagnosis not present

## 2023-12-15 DIAGNOSIS — Z515 Encounter for palliative care: Secondary | ICD-10-CM

## 2023-12-15 DIAGNOSIS — Z923 Personal history of irradiation: Secondary | ICD-10-CM | POA: Diagnosis not present

## 2023-12-15 DIAGNOSIS — C50212 Malignant neoplasm of upper-inner quadrant of left female breast: Secondary | ICD-10-CM | POA: Diagnosis not present

## 2023-12-15 DIAGNOSIS — R112 Nausea with vomiting, unspecified: Secondary | ICD-10-CM | POA: Diagnosis not present

## 2023-12-15 DIAGNOSIS — F419 Anxiety disorder, unspecified: Secondary | ICD-10-CM

## 2023-12-15 DIAGNOSIS — C7801 Secondary malignant neoplasm of right lung: Secondary | ICD-10-CM | POA: Diagnosis not present

## 2023-12-15 DIAGNOSIS — Z95828 Presence of other vascular implants and grafts: Secondary | ICD-10-CM

## 2023-12-15 LAB — CBC WITH DIFFERENTIAL (CANCER CENTER ONLY)
Abs Immature Granulocytes: 0.02 10*3/uL (ref 0.00–0.07)
Basophils Absolute: 0 10*3/uL (ref 0.0–0.1)
Basophils Relative: 0 %
Eosinophils Absolute: 0.1 10*3/uL (ref 0.0–0.5)
Eosinophils Relative: 2 %
HCT: 42.8 % (ref 36.0–46.0)
Hemoglobin: 14.3 g/dL (ref 12.0–15.0)
Immature Granulocytes: 0 %
Lymphocytes Relative: 35 %
Lymphs Abs: 1.9 10*3/uL (ref 0.7–4.0)
MCH: 28.8 pg (ref 26.0–34.0)
MCHC: 33.4 g/dL (ref 30.0–36.0)
MCV: 86.3 fL (ref 80.0–100.0)
Monocytes Absolute: 0.4 10*3/uL (ref 0.1–1.0)
Monocytes Relative: 8 %
Neutro Abs: 3 10*3/uL (ref 1.7–7.7)
Neutrophils Relative %: 55 %
Platelet Count: 235 10*3/uL (ref 150–400)
RBC: 4.96 MIL/uL (ref 3.87–5.11)
RDW: 14.1 % (ref 11.5–15.5)
WBC Count: 5.5 10*3/uL (ref 4.0–10.5)
nRBC: 0 % (ref 0.0–0.2)

## 2023-12-15 LAB — CMP (CANCER CENTER ONLY)
ALT: 62 U/L — ABNORMAL HIGH (ref 0–44)
AST: 53 U/L — ABNORMAL HIGH (ref 15–41)
Albumin: 3.9 g/dL (ref 3.5–5.0)
Alkaline Phosphatase: 76 U/L (ref 38–126)
Anion gap: 7 (ref 5–15)
BUN: 14 mg/dL (ref 8–23)
CO2: 31 mmol/L (ref 22–32)
Calcium: 9.6 mg/dL (ref 8.9–10.3)
Chloride: 104 mmol/L (ref 98–111)
Creatinine: 0.76 mg/dL (ref 0.44–1.00)
GFR, Estimated: >60 mL/min (ref >60)
Glucose, Bld: 117 mg/dL — ABNORMAL HIGH (ref 70–99)
Potassium: 4.3 mmol/L (ref 3.5–5.1)
Sodium: 142 mmol/L (ref 135–145)
Total Bilirubin: 0.6 mg/dL (ref 0.0–1.2)
Total Protein: 7.5 g/dL (ref 6.5–8.1)

## 2023-12-15 MED ORDER — GABAPENTIN 300 MG PO CAPS: 300.0000 mg | ORAL_CAPSULE | Freq: Two times a day (BID) | ORAL | 3 refills | Status: DC

## 2023-12-15 MED ORDER — LORAZEPAM 2 MG/ML IJ SOLN
0.5000 mg | Freq: Once | INTRAMUSCULAR | Status: AC
  Administered 2023-12-15: 0.5 mg via INTRAVENOUS
  Filled 2023-12-15: qty 1

## 2023-12-15 MED ORDER — SODIUM CHLORIDE 0.9 % IV SOLN: Freq: Once | INTRAVENOUS | Status: AC

## 2023-12-15 MED ORDER — DIAZEPAM 5 MG PO TABS: 5.0000 mg | ORAL_TABLET | Freq: Two times a day (BID) | ORAL | 0 refills | Status: DC | PRN

## 2023-12-15 MED ORDER — ACETAMINOPHEN 325 MG PO TABS
650.0000 mg | ORAL_TABLET | Freq: Once | ORAL | Status: AC
  Administered 2023-12-15: 650 mg via ORAL
  Filled 2023-12-15: qty 2

## 2023-12-15 MED ORDER — TRASTUZUMAB-ANNS CHEMO 150 MG IV SOLR
750.0000 mg | Freq: Once | INTRAVENOUS | Status: AC
  Administered 2023-12-15: 750 mg via INTRAVENOUS
  Filled 2023-12-15: qty 35.71

## 2023-12-15 MED ORDER — DIPHENHYDRAMINE HCL 25 MG PO CAPS
50.0000 mg | ORAL_CAPSULE | Freq: Once | ORAL | Status: AC
  Administered 2023-12-15: 50 mg via ORAL
  Filled 2023-12-15: qty 2

## 2023-12-15 MED ORDER — SODIUM CHLORIDE 0.9% FLUSH
10.0000 mL | Freq: Once | INTRAVENOUS | Status: AC
  Administered 2023-12-15: 10 mL

## 2023-12-15 NOTE — Patient Instructions (Signed)
 [facility] - A DEPT OF [facility]  Discharge Instructions: Thank you for choosing [facility] to provide your oncology and hematology care.   If you have a lab appointment with the Cancer Center, please go directly to the Cancer Center and check in at the registration area.   Wear comfortable clothing and clothing appropriate for easy access to any Portacath or PICC line.   We strive to give you quality time with your provider. You may need to reschedule your appointment if you arrive late (15 or more minutes).  Arriving late affects you and other patients whose appointments are after yours.  Also, if you miss three or more appointments without notifying the office, you may be dismissed from the clinic at the provider's discretion.      For prescription refill requests, have your pharmacy contact our office and allow 72 hours for refills to be completed.    Today you received the following chemotherapy and/or immunotherapy agents kanjinti      To help prevent nausea and vomiting after your treatment, we encourage you to take your nausea medication as directed.  BELOW ARE SYMPTOMS THAT SHOULD BE REPORTED IMMEDIATELY: *FEVER GREATER THAN 100.4 F (38 C) OR HIGHER *CHILLS OR SWEATING *NAUSEA AND VOMITING THAT IS NOT CONTROLLED WITH YOUR NAUSEA MEDICATION *UNUSUAL SHORTNESS OF BREATH *UNUSUAL BRUISING OR BLEEDING *URINARY PROBLEMS (pain or burning when urinating, or frequent urination) *BOWEL PROBLEMS (unusual diarrhea, constipation, pain near the anus) TENDERNESS IN MOUTH AND THROAT WITH OR WITHOUT PRESENCE OF ULCERS (sore throat, sores in mouth, or a toothache) UNUSUAL RASH, SWELLING OR PAIN  UNUSUAL VAGINAL DISCHARGE OR ITCHING   Items with * indicate a potential emergency and should be followed up as soon as possible or go to the Emergency Department if any problems should occur.  Please show the CHEMOTHERAPY ALERT CARD or IMMUNOTHERAPY  ALERT CARD at check-in to the Emergency Department and triage nurse.  Should you have questions after your visit or need to cancel or reschedule your appointment, please contact [facility] - A DEPT OF [facility]  Dept: [phone]  and follow the prompts.  Office hours are 8:00 a.m. to 4:30 p.m. Monday - Friday. Please note that voicemails left after 4:00 p.m. may not be returned until the following business day.  We are closed weekends and major holidays. You have access to a nurse at all times for urgent questions. Please call the main number to the clinic Dept: [phone] and follow the prompts.   For any non-urgent questions, you may also contact your provider using MyChart. We now offer e-Visits for anyone 81 and older to request care online for non-urgent symptoms. For details visit [email or web address].   Also download the MyChart app! Go to the app store, search "MyChart", open the app, select [facility], and log in with your MyChart username and password.

## 2023-12-16 ENCOUNTER — Encounter: Payer: Self-pay | Admitting: Hematology and Oncology

## 2023-12-16 NOTE — Progress Notes (Addendum)
 Palliative Medicine [facility]  [phone] [phone]   Name: [name] Date: 12/16/2023 MRN: [id number]  DOB: July 17, 1951  Patient Care Team: Jonne Netters, MD as PCP - General (Family Medicine) Darlis Eisenmenger, MD as Consulting Physician (Cardiology) Murleen Arms, MD as Consulting Physician (Hematology and Oncology)    INTERVAL HISTORY: [name] is a 73 y.o. female with medical history including metastatic breast cancer with numerous pulmonary nodules bilaterally s/p chemotherapy currently on herceptin  for maintenance. Now with complaints of uncontrolled back pain.  Palliative ask to see for symptom management.    SOCIAL HISTORY:     reports that she quit smoking about 55 years ago. Her smoking use included cigarettes. She started smoking about 60 years ago. She has a 15 pack-year smoking history. She has been exposed to tobacco smoke. She has never used smokeless tobacco. She reports current drug use. Drug: Marijuana. She reports that she does not drink alcohol.  ADVANCE DIRECTIVES:  None on file  CODE STATUS: Full code  PAST MEDICAL HISTORY: Past Medical History:  Diagnosis Date   Arthritis    Back pain    Breast cancer (HCC) dx'd 11/2012   left   Chest pain    COPD (chronic obstructive pulmonary disease) (HCC)    Diabetes mellitus without complication (HCC) 09/28/2014   Ear pain    Fatty liver 6/03   Hypertension    Lung disease    Lung metastases dx'd 11/2012   Obesity, unspecified    Other abnormal glucose    Suicide attempt (HCC) 1996   Syncope and collapse    Unspecified sleep apnea     ALLERGIES:  is allergic to meperidine hcl, penicillins, and aspirin .  MEDICATIONS:  Current Outpatient Medications  Medication Sig Dispense Refill   albuterol  (VENTOLIN  HFA) 108 (90 Base) MCG/ACT inhaler INHALE 2 PUFFS INTO THE LUNGS EVERY 6 HOURS AS NEEDED FOR WHEEZE 8.5 each 1   B-D ULTRAFINE III SHORT PEN 31G X 8 MM  MISC PLEASE USE TO INJECT INSULIN  4 TIMES DAILY. 200 each 3   Blood Glucose Monitoring Suppl (ONE TOUCH ULTRA 2) w/Device KIT 1 kit by Does not apply route QID. ICD 10-code: E11.49. 1 kit 0   diazepam  (VALIUM ) 5 MG tablet Take 1 tablet (5 mg total) by mouth every 12 (twelve) hours as needed for anxiety. 30 tablet 0   gabapentin  (NEURONTIN ) 300 MG capsule Take 1 capsule (300 mg total) by mouth 2 (two) times daily. 60 capsule 3   insulin  glargine (LANTUS  SOLOSTAR) 100 UNIT/ML Solostar Pen Inject 14 Units into the skin daily. 3 mL 0   insulin  lispro (HUMALOG ) 100 UNIT/ML KwikPen Inject 8-10 Units into the skin 3 (three) times daily. 3 mL 3   Insulin  Pen Needle (PEN NEEDLES) 30G X 8 MM MISC Please use to inject insulin  4 times daily. 200 each 3   letrozole  (FEMARA ) 2.5 MG tablet TAKE 1 TABLET BY MOUTH EVERY DAY 90 tablet 12   loperamide  (IMODIUM ) 2 MG capsule TAKE 1 CAPSULE (2 MG TOTAL) BY MOUTH AS NEEDED FOR DIARRHEA OR LOOSE STOOLS. 30 capsule 1   ondansetron  (ZOFRAN ) 8 MG tablet TAKE 1 TABLET BY MOUTH EVERY 8 HOURS AS NEEDED 20 tablet 2   ONETOUCH ULTRA test strip USE AS INSTRUCTED TO CHECK ONCE DAILY 100 strip 12   OXYGEN  Inhale into the lungs.     polyethylene glycol powder (MIRALAX ) 17 GM/SCOOP powder Take 1 capful twice daily. 238  g 6   Semaglutide , 2 MG/DOSE, (OZEMPIC , 2 MG/DOSE,) 8 MG/3ML SOPN Inject 2 mg into the skin once a week. 3 mL 1   No current facility-administered medications for this visit.    VITAL SIGNS: There were no vitals taken for this visit. There were no vitals filed for this visit.  Estimated body mass index is 51.68 kg/m as calculated from the following:   Height as of 10/20/23: 5\' 4"  (1.626 m).   Weight as of 11/17/23: 301 lb 1.6 oz (136.6 kg).   PERFORMANCE STATUS (ECOG) : 1 - Symptomatic but completely ambulatory  Physical Exam General: NAD Cardiovascular: regular rate and rhythm Pulmonary: normal breathing pattern Extremities: no edema, no joint  deformities Skin: no rashes Neurological: AAO x3  IMPRESSION: Discussed the use of AI scribe software for clinical note transcription with the patient, who gave verbal consent to proceed.  History of Present Illness [name] is a 73 year old female with chronic pain who was seen during infusion for symptom management. Patient is tolerating infusion. No acute distress.   We discussed her pain and discomfort at length. Patient is tolerating gabapentin  without difficulty. Recommended patient to increase to twice daily. She is requesting to consider use of Roxicodone  versus "time-released" I discussed with patient that she previously attempted use of OxyContin , which caused significant nausea and was discontinued immediately. She verbalized understanding. Patient previously taking oxycodone  for pain. States pain has increased and disrupts her sleep, leading to stress and fatigue. She uses Ativan  and Benadryl  to manage symptoms but occasionally struggles with sleep deprivation and pain. No adjustments to regimen at this time. She expresses desires to consider other pain regimen in the future to offer relief.   She deals with gastrointestinal issues, specifically diarrhea, and uses Imodium  to manage these symptoms. She is frustrated with changes in her body's response to medications and the impact on her quality of life.   I discussed the importance of continued conversation with family and their medical providers regarding overall plan of care and treatment options, ensuring decisions are within the context of the patients values and GOCs. Assessment & Plan Pain management issues Chronic pain with intermittent flares. Previous oxycodone . Has not refilled since February 2025. Gabapentin  effective at higher doses. Consider restarting opioids if pain worsens. - Consider increasing gabapentin  dosage to 300mg  twice daily.  -Advised against starting and restarting opioids and adjustments. Will continue  to closely monitor.   Sleep disturbance due to pain Sleep disturbance secondary to pain flares causing stress and fatigue. Current Ativan  and Benadryl  use insufficient. - Continue Ativan  and Benadryl  for sleep support. - Evaluate pain management effectiveness to improve sleep quality.  Diarrhea management Intermittent diarrhea managed with Lomotil. Discussed potential alternatives. - Continue Lomotil for diarrhea management. - Consider Imodium  or Limodium if Lomotil is unavailable.  Nausea and Vomiting Experiences nausea and vomiting, potentially medication-related. Reports running out of Zofran  due to insurance limitations. - Notify clinic when in need of Zofran  tablets for reauthorization to prevent depletion.  Anxiety/insomnia Patient reports anxiety is better controlled with the Valium .  Discontinue Ativan  use.  Valium  may also assist with patient's lower back pain. -Valium  5 mg twice daily as needed.  Follow-up. -See patient back in the clinic in 4-6 weeks.  Sooner if needed.  Patient expressed understanding and was in agreement with this plan. She also understands that She can call the clinic at any time with any questions, concerns, or complaints.   Any controlled substances utilized were  prescribed in the context of palliative care. PDMP has been reviewed.   Visit consisted of counseling and education dealing with the complex and emotionally intense issues of symptom management and palliative care in the setting of serious and potentially life-threatening illness.  Dellia Ferguson, AGPCNP-BC  Palliative Medicine Team/[facility]

## 2023-12-25 DIAGNOSIS — E1149 Type 2 diabetes mellitus with other diabetic neurological complication: Secondary | ICD-10-CM | POA: Diagnosis not present

## 2024-01-11 ENCOUNTER — Other Ambulatory Visit: Payer: Self-pay | Admitting: Family Medicine

## 2024-01-11 DIAGNOSIS — E1149 Type 2 diabetes mellitus with other diabetic neurological complication: Secondary | ICD-10-CM

## 2024-01-12 ENCOUNTER — Other Ambulatory Visit: Payer: Self-pay | Admitting: *Deleted

## 2024-01-12 ENCOUNTER — Other Ambulatory Visit (HOSPITAL_COMMUNITY)

## 2024-01-12 DIAGNOSIS — C50919 Malignant neoplasm of unspecified site of unspecified female breast: Secondary | ICD-10-CM

## 2024-01-13 ENCOUNTER — Inpatient Hospital Stay

## 2024-01-13 ENCOUNTER — Ambulatory Visit (HOSPITAL_COMMUNITY)
Admission: RE | Admit: 2024-01-13 | Discharge: 2024-01-13 | Disposition: A | Discharge status: Home / Self Care | Source: Ambulatory Visit | Attending: Hematology and Oncology | Admitting: Hematology and Oncology

## 2024-01-13 ENCOUNTER — Inpatient Hospital Stay: Attending: Oncology

## 2024-01-13 ENCOUNTER — Encounter: Payer: Self-pay | Admitting: Nurse Practitioner

## 2024-01-13 ENCOUNTER — Inpatient Hospital Stay (HOSPITAL_BASED_OUTPATIENT_CLINIC_OR_DEPARTMENT_OTHER): Admitting: Nurse Practitioner

## 2024-01-13 VITALS — BP 155/88 | HR 73 | Temp 98.3°F | Resp 18 | Ht 64.0 in | Wt 303.9 lb

## 2024-01-13 DIAGNOSIS — C7800 Secondary malignant neoplasm of unspecified lung: Secondary | ICD-10-CM | POA: Insufficient documentation

## 2024-01-13 DIAGNOSIS — Z7189 Other specified counseling: Secondary | ICD-10-CM

## 2024-01-13 DIAGNOSIS — Z95828 Presence of other vascular implants and grafts: Secondary | ICD-10-CM

## 2024-01-13 DIAGNOSIS — Z87891 Personal history of nicotine dependence: Secondary | ICD-10-CM | POA: Insufficient documentation

## 2024-01-13 DIAGNOSIS — E119 Type 2 diabetes mellitus without complications: Secondary | ICD-10-CM | POA: Insufficient documentation

## 2024-01-13 DIAGNOSIS — R530 Neoplastic (malignant) related fatigue: Secondary | ICD-10-CM | POA: Diagnosis not present

## 2024-01-13 DIAGNOSIS — Z170 Estrogen receptor positive status [ER+]: Secondary | ICD-10-CM | POA: Diagnosis not present

## 2024-01-13 DIAGNOSIS — C50212 Malignant neoplasm of upper-inner quadrant of left female breast: Secondary | ICD-10-CM | POA: Diagnosis not present

## 2024-01-13 DIAGNOSIS — F419 Anxiety disorder, unspecified: Secondary | ICD-10-CM | POA: Insufficient documentation

## 2024-01-13 DIAGNOSIS — Z0189 Encounter for other specified special examinations: Secondary | ICD-10-CM

## 2024-01-13 DIAGNOSIS — R531 Weakness: Secondary | ICD-10-CM | POA: Diagnosis not present

## 2024-01-13 DIAGNOSIS — G8929 Other chronic pain: Secondary | ICD-10-CM | POA: Diagnosis not present

## 2024-01-13 DIAGNOSIS — I10 Essential (primary) hypertension: Secondary | ICD-10-CM | POA: Insufficient documentation

## 2024-01-13 DIAGNOSIS — M792 Neuralgia and neuritis, unspecified: Secondary | ICD-10-CM

## 2024-01-13 DIAGNOSIS — Z5112 Encounter for antineoplastic immunotherapy: Secondary | ICD-10-CM | POA: Diagnosis not present

## 2024-01-13 DIAGNOSIS — C50919 Malignant neoplasm of unspecified site of unspecified female breast: Secondary | ICD-10-CM | POA: Insufficient documentation

## 2024-01-13 DIAGNOSIS — G4700 Insomnia, unspecified: Secondary | ICD-10-CM | POA: Diagnosis not present

## 2024-01-13 DIAGNOSIS — Z515 Encounter for palliative care: Secondary | ICD-10-CM

## 2024-01-13 DIAGNOSIS — G4730 Sleep apnea, unspecified: Secondary | ICD-10-CM | POA: Insufficient documentation

## 2024-01-13 DIAGNOSIS — G893 Neoplasm related pain (acute) (chronic): Secondary | ICD-10-CM | POA: Diagnosis not present

## 2024-01-13 LAB — CMP (CANCER CENTER ONLY)
ALT: 62 U/L — ABNORMAL HIGH (ref 0–44)
AST: 52 U/L — ABNORMAL HIGH (ref 15–41)
Albumin: 3.7 g/dL (ref 3.5–5.0)
Alkaline Phosphatase: 91 U/L (ref 38–126)
Anion gap: 7 (ref 5–15)
BUN: 17 mg/dL (ref 8–23)
CO2: 30 mmol/L (ref 22–32)
Calcium: 9.6 mg/dL (ref 8.9–10.3)
Chloride: 102 mmol/L (ref 98–111)
Creatinine: 0.81 mg/dL (ref 0.44–1.00)
GFR, Estimated: >60 mL/min (ref >60)
Glucose, Bld: 238 mg/dL — ABNORMAL HIGH (ref 70–99)
Potassium: 4.2 mmol/L (ref 3.5–5.1)
Sodium: 139 mmol/L (ref 135–145)
Total Bilirubin: 0.5 mg/dL (ref 0.0–1.2)
Total Protein: 7.4 g/dL (ref 6.5–8.1)

## 2024-01-13 LAB — CBC WITH DIFFERENTIAL (CANCER CENTER ONLY)
Abs Immature Granulocytes: 0.02 10*3/uL (ref 0.00–0.07)
Basophils Absolute: 0 10*3/uL (ref 0.0–0.1)
Basophils Relative: 0 %
Eosinophils Absolute: 0.1 10*3/uL (ref 0.0–0.5)
Eosinophils Relative: 2 %
HCT: 43.1 % (ref 36.0–46.0)
Hemoglobin: 14.3 g/dL (ref 12.0–15.0)
Immature Granulocytes: 0 %
Lymphocytes Relative: 24 %
Lymphs Abs: 1.5 10*3/uL (ref 0.7–4.0)
MCH: 28.9 pg (ref 26.0–34.0)
MCHC: 33.2 g/dL (ref 30.0–36.0)
MCV: 87.2 fL (ref 80.0–100.0)
Monocytes Absolute: 0.4 10*3/uL (ref 0.1–1.0)
Monocytes Relative: 6 %
Neutro Abs: 4.3 10*3/uL (ref 1.7–7.7)
Neutrophils Relative %: 68 %
Platelet Count: 211 10*3/uL (ref 150–400)
RBC: 4.94 MIL/uL (ref 3.87–5.11)
RDW: 13.9 % (ref 11.5–15.5)
WBC Count: 6.3 10*3/uL (ref 4.0–10.5)
nRBC: 0 % (ref 0.0–0.2)

## 2024-01-13 LAB — ECHOCARDIOGRAM COMPLETE
Area-P 1/2: 3.06 cm2
Calc EF: 65.1 %
Est EF: 65
Height: 64 in
S' Lateral: 2.7 cm
Single Plane A2C EF: 60.2 %
Single Plane A4C EF: 70.2 %
Weight: 4862.4 [oz_av]

## 2024-01-13 MED ORDER — LORAZEPAM 2 MG/ML IJ SOLN: 0.5000 mg | Freq: Once | INTRAMUSCULAR | Status: DC

## 2024-01-13 MED ORDER — SODIUM CHLORIDE 0.9 % IV SOLN: Freq: Once | INTRAVENOUS | Status: AC

## 2024-01-13 MED ORDER — ACETAMINOPHEN 325 MG PO TABS
650.0000 mg | ORAL_TABLET | Freq: Once | ORAL | Status: AC
  Administered 2024-01-13: 650 mg via ORAL
  Filled 2024-01-13: qty 2

## 2024-01-13 MED ORDER — HEPARIN SOD (PORK) LOCK FLUSH 100 UNIT/ML IV SOLN
500.0000 [IU] | Freq: Once | INTRAVENOUS | Status: DC | PRN
Start: 2024-01-13 — End: 2024-01-13

## 2024-01-13 MED ORDER — SODIUM CHLORIDE 0.9% FLUSH
10.0000 mL | Freq: Once | INTRAVENOUS | Status: AC
  Administered 2024-01-13: 10 mL

## 2024-01-13 MED ORDER — TRASTUZUMAB-ANNS CHEMO 150 MG IV SOLR
750.0000 mg | Freq: Once | INTRAVENOUS | Status: AC
  Administered 2024-01-13: 750 mg via INTRAVENOUS
  Filled 2024-01-13: qty 35.71

## 2024-01-13 MED ORDER — DIAZEPAM 5 MG PO TABS: 5.0000 mg | ORAL_TABLET | Freq: Two times a day (BID) | ORAL | 0 refills | Status: DC | PRN

## 2024-01-13 MED ORDER — DIPHENHYDRAMINE HCL 25 MG PO CAPS
50.0000 mg | ORAL_CAPSULE | Freq: Once | ORAL | Status: AC
  Administered 2024-01-13: 50 mg via ORAL
  Filled 2024-01-13: qty 2

## 2024-01-13 MED ORDER — GABAPENTIN 300 MG PO CAPS
600.0000 mg | ORAL_CAPSULE | Freq: Three times a day (TID) | ORAL | 3 refills | Status: DC
Start: 2024-01-13 — End: 2024-02-09

## 2024-01-13 MED ORDER — SODIUM CHLORIDE 0.9% FLUSH: 10.0000 mL | INTRAVENOUS | Status: DC | PRN

## 2024-01-13 NOTE — Progress Notes (Signed)
  Echocardiogram 2D Echocardiogram has been performed.  [name] 01/13/2024, 4:07 PM

## 2024-01-13 NOTE — Progress Notes (Signed)
 Palliative Medicine [facility]  [phone] [phone]   Name: [name] Date: 01/13/2024 MRN: [id number]  DOB: 30-Sep-1950  Patient Care Team: Jonne Netters, MD as PCP - General (Family Medicine) Darlis Eisenmenger, MD as Consulting Physician (Cardiology) Murleen Arms, MD as Consulting Physician (Hematology and Oncology)    INTERVAL HISTORY: [name] is a 73 y.o. female with medical history including metastatic breast cancer with numerous pulmonary nodules bilaterally s/p chemotherapy currently on herceptin  for maintenance. Now with complaints of uncontrolled back pain.  Palliative ask to see for symptom management.    SOCIAL HISTORY:     reports that she quit smoking about 55 years ago. Her smoking use included cigarettes. She started smoking about 60 years ago. She has a 15 pack-year smoking history. She has been exposed to tobacco smoke. She has never used smokeless tobacco. She reports current drug use. Drug: Marijuana. She reports that she does not drink alcohol.  ADVANCE DIRECTIVES:  None on file  CODE STATUS: Full code  PAST MEDICAL HISTORY: Past Medical History:  Diagnosis Date   Arthritis    Back pain    Breast cancer (HCC) dx'd 11/2012   left   Chest pain    COPD (chronic obstructive pulmonary disease) (HCC)    Diabetes mellitus without complication (HCC) 09/28/2014   Ear pain    Fatty liver 6/03   Hypertension    Lung disease    Lung metastases dx'd 11/2012   Obesity, unspecified    Other abnormal glucose    Suicide attempt (HCC) 1996   Syncope and collapse    Unspecified sleep apnea     ALLERGIES:  is allergic to meperidine hcl, penicillins, and aspirin .  MEDICATIONS:  Current Outpatient Medications  Medication Sig Dispense Refill   albuterol  (VENTOLIN  HFA) 108 (90 Base) MCG/ACT inhaler INHALE 2 PUFFS INTO THE LUNGS EVERY 6 HOURS AS NEEDED FOR WHEEZE 8.5 each 1   B-D ULTRAFINE III SHORT PEN 31G X 8 MM  MISC PLEASE USE TO INJECT INSULIN  4 TIMES DAILY. 200 each 3   Blood Glucose Monitoring Suppl (ONE TOUCH ULTRA 2) w/Device KIT 1 kit by Does not apply route QID. ICD 10-code: E11.49. 1 kit 0   diazepam  (VALIUM ) 5 MG tablet Take 1 tablet (5 mg total) by mouth every 12 (twelve) hours as needed for anxiety. 30 tablet 0   gabapentin  (NEURONTIN ) 300 MG capsule Take 1 capsule (300 mg total) by mouth 2 (two) times daily. 60 capsule 3   insulin  glargine (LANTUS  SOLOSTAR) 100 UNIT/ML Solostar Pen Inject 16 Units into the skin daily. 15 mL 6   insulin  lispro (HUMALOG ) 100 UNIT/ML KwikPen Inject 8-10 Units into the skin 3 (three) times daily. 3 mL 3   Insulin  Pen Needle (PEN NEEDLES) 30G X 8 MM MISC Please use to inject insulin  4 times daily. 200 each 3   letrozole  (FEMARA ) 2.5 MG tablet TAKE 1 TABLET BY MOUTH EVERY DAY 90 tablet 12   loperamide  (IMODIUM ) 2 MG capsule TAKE 1 CAPSULE (2 MG TOTAL) BY MOUTH AS NEEDED FOR DIARRHEA OR LOOSE STOOLS. 30 capsule 1   ondansetron  (ZOFRAN ) 8 MG tablet TAKE 1 TABLET BY MOUTH EVERY 8 HOURS AS NEEDED 20 tablet 2   ONETOUCH ULTRA test strip USE AS INSTRUCTED TO CHECK ONCE DAILY 100 strip 12   OXYGEN  Inhale into the lungs.     polyethylene glycol powder (MIRALAX ) 17 GM/SCOOP powder Take 1 capful twice daily. 238  g 6   Semaglutide , 2 MG/DOSE, (OZEMPIC , 2 MG/DOSE,) 8 MG/3ML SOPN Inject 2 mg into the skin once a week. 3 mL 1   No current facility-administered medications for this visit.   Facility-Administered Medications Ordered in Other Visits  Medication Dose Route Frequency Provider Last Rate Last Admin   heparin  lock flush 100 unit/mL  500 Units Intracatheter Once PRN Iruku, Praveena, MD       LORazepam  (ATIVAN ) injection 0.5 mg  0.5 mg Intravenous Once Iruku, Praveena, MD       sodium chloride  flush (NS) 0.9 % injection 10 mL  10 mL Intracatheter PRN Iruku, Praveena, MD        VITAL SIGNS: BP (!) 155/88 (BP Location: Right Wrist, Patient Position: Sitting)   Pulse  73   Temp 98.3 F (36.8 C) (Temporal)   Resp 18   Ht 5\' 4"  (1.626 m)   Wt (!) 303 lb 14.4 oz (137.8 kg)   SpO2 97%   BMI 52.16 kg/m  Filed Weights   01/13/24 1347  Weight: (!) 303 lb 14.4 oz (137.8 kg)    Estimated body mass index is 52.16 kg/m as calculated from the following:   Height as of this encounter: 5\' 4"  (1.626 m).   Weight as of this encounter: 303 lb 14.4 oz (137.8 kg).   PERFORMANCE STATUS (ECOG) : 1 - Symptomatic but completely ambulatory  Physical Exam General: NAD Cardiovascular: regular rate and rhythm Pulmonary: normal breathing pattern Extremities: no edema, no joint deformities Skin: no rashes Neurological: AAO x3  IMPRESSION: Discussed the use of AI scribe software for clinical note transcription with the patient, who gave verbal consent to proceed.  History of Present Illness [name] is a 73 year old female with manage stated breast cancer who presents to clinic for symptom management follow-up.  No acute distress noted.  Patient continues to take things one day at a time.  Reports ongoing fatigue.  Denies concerns with nausea, vomiting, or diarrhea.  Constipation controlled with twice daily MiraLAX .  Appetite is fair.  Some days are better than others.  [name] reports concerns for her ongoing fatigue.  States over the past several weeks she has noticed increasing fatigue and weakness. She reports at times she can barely walk to the bathroom and sometimes falls on the commode. She uses a cane for support and drops objects due to weakness, such as pots and glasses of milk. She has been living with cancer for over ten years and believes it is contributing to her increasing weakness. Despite normal blood work, she feels her body is not well.  Patient feels she needs additional home assistance however she has had issues with insurance coverage for home health services.  Previous trials have been denied.  She has difficulty sleeping with occasional  anxiety.  She is tolerating Valium  at night, which helps her fall asleep by 11:30 PM or 12:00 AM, but she wakes up by 3:00 AM and cannot return to sleep. She sleeps in a recliner due to difficulty breathing and back issues, as she cannot get her hips up to sleep in a bed.  We discussed her pain and discomfort at length. Patient is tolerating gabapentin  without difficulty. Recommended patient to increase to 2 capsules twice daily.   Goals of care [name] she has concerns that her body may be starting to physically show decline and that she will no longer be able to tolerate significant treatments in the future.  States she  is questioning the need for further scans as she does not feel she will do anything differently based on the imaging results.  We discussed the purpose of echocardiogram and CT scans to assist the oncologist with directing further treatment paths.  Danyeal states she is not a candidate for any surgical procedures and I am sure that she will consider any additional options outside of her current regimen specifically stating she is not interested in pursuing radiation if ever cancer will still progress.  She empathetically approach discussions regarding her healthcare limitations and wishes.  Patient is clear and expressed wishes to continue to treat the treatable allow her every opportunity to continue to thrive as she has no desire to "give up at this time".  She does however states in the event of further health decline her desire would be for natural death with no life-sustaining measures.   I discussed the importance of continued conversation with family and their medical providers regarding overall plan of care and treatment options, ensuring decisions are within the context of the patients values and GOCs. Assessment & Plan Anxiety/insomnia Patient reports anxiety is better controlled with the Valium .   -Valium  5 mg twice daily as needed.  Pain management issues Chronic pain  with intermittent flares. Previous oxycodone . Has not refilled since February 2025. Gabapentin  effective at higher doses. Consider restarting opioids if pain worsens. - Increase gabapentin  600 mg 3 times daily.   -Advised against starting and restarting opioids and adjustments. Will continue to closely monitor.  Cancer-related fatigue and weakness Chronic fatigue and weakness from prolonged cancer treatment, causing mobility issues and falls. Insurance limits home health access.   Goals of Care She prefers natural disease progression without aggressive interventions, avoiding unnecessary tests or treatments.  - Clear and expressed wishes to continue to treat the treatable allow her every opportunity to continue to thrive.  Patient's speech specifically if cancer was to progress she would not be interested in pursuing further radiation therapy or more extensive treatments. - Expresses wishes for natural death with no life-sustaining measures when her time comes  Follow-up -See patient back in the clinic in 4-6 weeks.  Sooner if needed.  Patient expressed understanding and was in agreement with this plan. She also understands that She can call the clinic at any time with any questions, concerns, or complaints.   Any controlled substances utilized were prescribed in the context of palliative care. PDMP has been reviewed.   Visit consisted of counseling and education dealing with the complex and emotionally intense issues of symptom management and palliative care in the setting of serious and potentially life-threatening illness.  Dellia Ferguson, AGPCNP-BC  Palliative Medicine Team/[facility]

## 2024-01-15 ENCOUNTER — Other Ambulatory Visit: Payer: Self-pay | Admitting: Nurse Practitioner

## 2024-01-15 DIAGNOSIS — C50919 Malignant neoplasm of unspecified site of unspecified female breast: Secondary | ICD-10-CM

## 2024-01-15 DIAGNOSIS — Z515 Encounter for palliative care: Secondary | ICD-10-CM

## 2024-01-15 DIAGNOSIS — R110 Nausea: Secondary | ICD-10-CM

## 2024-01-25 DIAGNOSIS — E1149 Type 2 diabetes mellitus with other diabetic neurological complication: Secondary | ICD-10-CM | POA: Diagnosis not present

## 2024-01-26 ENCOUNTER — Other Ambulatory Visit: Payer: Self-pay | Admitting: Family Medicine

## 2024-01-26 ENCOUNTER — Other Ambulatory Visit: Payer: Self-pay

## 2024-01-26 DIAGNOSIS — E1149 Type 2 diabetes mellitus with other diabetic neurological complication: Secondary | ICD-10-CM

## 2024-01-26 MED ORDER — OZEMPIC (2 MG/DOSE) 8 MG/3ML ~~LOC~~ SOPN
2.0000 mg | PEN_INJECTOR | SUBCUTANEOUS | 3 refills | Status: AC
Start: 2024-01-26 — End: ?
  Filled 2024-01-26: qty 3, 28d supply, fill #0
  Filled 2024-02-19: qty 3, 28d supply, fill #1
  Filled 2024-03-18: qty 3, 28d supply, fill #2
  Filled 2024-04-14: qty 3, 28d supply, fill #3

## 2024-02-01 ENCOUNTER — Other Ambulatory Visit: Payer: Self-pay

## 2024-02-01 MED ORDER — LOPERAMIDE HCL 2 MG PO CAPS: 2.0000 mg | ORAL_CAPSULE | ORAL | 6 refills | Status: DC | PRN

## 2024-02-01 NOTE — Telephone Encounter (Signed)
 Pt called for imodium  refill, see associated orders. Allergy noted in chart, pt reports taking imodium  2 days ago and had no adverse reactions to it. No further needs at this time.

## 2024-02-08 ENCOUNTER — Other Ambulatory Visit: Payer: Self-pay | Admitting: *Deleted

## 2024-02-08 DIAGNOSIS — R062 Wheezing: Secondary | ICD-10-CM | POA: Diagnosis not present

## 2024-02-08 DIAGNOSIS — I5020 Unspecified systolic (congestive) heart failure: Secondary | ICD-10-CM | POA: Diagnosis not present

## 2024-02-08 DIAGNOSIS — C50919 Malignant neoplasm of unspecified site of unspecified female breast: Secondary | ICD-10-CM

## 2024-02-08 DIAGNOSIS — M25569 Pain in unspecified knee: Secondary | ICD-10-CM | POA: Diagnosis not present

## 2024-02-09 ENCOUNTER — Inpatient Hospital Stay

## 2024-02-09 ENCOUNTER — Other Ambulatory Visit: Payer: Self-pay

## 2024-02-09 ENCOUNTER — Inpatient Hospital Stay: Attending: Oncology | Admitting: Hematology and Oncology

## 2024-02-09 ENCOUNTER — Encounter: Payer: Self-pay | Admitting: Nurse Practitioner

## 2024-02-09 ENCOUNTER — Inpatient Hospital Stay (HOSPITAL_BASED_OUTPATIENT_CLINIC_OR_DEPARTMENT_OTHER): Admitting: Nurse Practitioner

## 2024-02-09 VITALS — BP 164/72 | HR 70 | Temp 98.2°F | Resp 20 | Wt 300.0 lb

## 2024-02-09 DIAGNOSIS — C50919 Malignant neoplasm of unspecified site of unspecified female breast: Secondary | ICD-10-CM

## 2024-02-09 DIAGNOSIS — Z87891 Personal history of nicotine dependence: Secondary | ICD-10-CM | POA: Diagnosis not present

## 2024-02-09 DIAGNOSIS — R062 Wheezing: Secondary | ICD-10-CM | POA: Diagnosis not present

## 2024-02-09 DIAGNOSIS — Z803 Family history of malignant neoplasm of breast: Secondary | ICD-10-CM | POA: Insufficient documentation

## 2024-02-09 DIAGNOSIS — C7800 Secondary malignant neoplasm of unspecified lung: Secondary | ICD-10-CM

## 2024-02-09 DIAGNOSIS — Z5112 Encounter for antineoplastic immunotherapy: Secondary | ICD-10-CM | POA: Diagnosis not present

## 2024-02-09 DIAGNOSIS — R531 Weakness: Secondary | ICD-10-CM | POA: Insufficient documentation

## 2024-02-09 DIAGNOSIS — R530 Neoplastic (malignant) related fatigue: Secondary | ICD-10-CM

## 2024-02-09 DIAGNOSIS — G4709 Other insomnia: Secondary | ICD-10-CM

## 2024-02-09 DIAGNOSIS — Z515 Encounter for palliative care: Secondary | ICD-10-CM | POA: Diagnosis not present

## 2024-02-09 DIAGNOSIS — Z8041 Family history of malignant neoplasm of ovary: Secondary | ICD-10-CM | POA: Insufficient documentation

## 2024-02-09 DIAGNOSIS — F419 Anxiety disorder, unspecified: Secondary | ICD-10-CM

## 2024-02-09 DIAGNOSIS — Z79811 Long term (current) use of aromatase inhibitors: Secondary | ICD-10-CM | POA: Diagnosis not present

## 2024-02-09 DIAGNOSIS — G8929 Other chronic pain: Secondary | ICD-10-CM | POA: Diagnosis not present

## 2024-02-09 DIAGNOSIS — G4700 Insomnia, unspecified: Secondary | ICD-10-CM | POA: Diagnosis not present

## 2024-02-09 DIAGNOSIS — Z170 Estrogen receptor positive status [ER+]: Secondary | ICD-10-CM | POA: Insufficient documentation

## 2024-02-09 DIAGNOSIS — I5020 Unspecified systolic (congestive) heart failure: Secondary | ICD-10-CM | POA: Diagnosis not present

## 2024-02-09 DIAGNOSIS — Z95828 Presence of other vascular implants and grafts: Secondary | ICD-10-CM

## 2024-02-09 DIAGNOSIS — M79659 Pain in unspecified thigh: Secondary | ICD-10-CM | POA: Diagnosis not present

## 2024-02-09 DIAGNOSIS — C50212 Malignant neoplasm of upper-inner quadrant of left female breast: Secondary | ICD-10-CM | POA: Insufficient documentation

## 2024-02-09 DIAGNOSIS — M25569 Pain in unspecified knee: Secondary | ICD-10-CM | POA: Diagnosis not present

## 2024-02-09 LAB — CBC WITH DIFFERENTIAL (CANCER CENTER ONLY)
Abs Immature Granulocytes: 0.02 10*3/uL (ref 0.00–0.07)
Basophils Absolute: 0 10*3/uL (ref 0.0–0.1)
Basophils Relative: 1 %
Eosinophils Absolute: 0.1 10*3/uL (ref 0.0–0.5)
Eosinophils Relative: 2 %
HCT: 41.9 % (ref 36.0–46.0)
Hemoglobin: 14.1 g/dL (ref 12.0–15.0)
Immature Granulocytes: 0 %
Lymphocytes Relative: 23 %
Lymphs Abs: 1.4 10*3/uL (ref 0.7–4.0)
MCH: 29.3 pg (ref 26.0–34.0)
MCHC: 33.7 g/dL (ref 30.0–36.0)
MCV: 86.9 fL (ref 80.0–100.0)
Monocytes Absolute: 0.4 10*3/uL (ref 0.1–1.0)
Monocytes Relative: 7 %
Neutro Abs: 3.9 10*3/uL (ref 1.7–7.7)
Neutrophils Relative %: 67 %
Platelet Count: 204 10*3/uL (ref 150–400)
RBC: 4.82 MIL/uL (ref 3.87–5.11)
RDW: 14.2 % (ref 11.5–15.5)
WBC Count: 5.8 10*3/uL (ref 4.0–10.5)
nRBC: 0 % (ref 0.0–0.2)

## 2024-02-09 LAB — CMP (CANCER CENTER ONLY)
ALT: 63 U/L — ABNORMAL HIGH (ref 0–44)
AST: 57 U/L — ABNORMAL HIGH (ref 15–41)
Albumin: 3.8 g/dL (ref 3.5–5.0)
Alkaline Phosphatase: 82 U/L (ref 38–126)
Anion gap: 6 (ref 5–15)
BUN: 20 mg/dL (ref 8–23)
CO2: 31 mmol/L (ref 22–32)
Calcium: 9.8 mg/dL (ref 8.9–10.3)
Chloride: 103 mmol/L (ref 98–111)
Creatinine: 0.8 mg/dL (ref 0.44–1.00)
GFR, Estimated: >60 mL/min (ref >60)
Glucose, Bld: 207 mg/dL — ABNORMAL HIGH (ref 70–99)
Potassium: 4.1 mmol/L (ref 3.5–5.1)
Sodium: 140 mmol/L (ref 135–145)
Total Bilirubin: 0.4 mg/dL (ref 0.0–1.2)
Total Protein: 7.5 g/dL (ref 6.5–8.1)

## 2024-02-09 MED ORDER — SODIUM CHLORIDE 0.9 % IV SOLN: Freq: Once | INTRAVENOUS | Status: AC

## 2024-02-09 MED ORDER — SODIUM CHLORIDE 0.9% FLUSH
10.0000 mL | Freq: Once | INTRAVENOUS | Status: AC
  Administered 2024-02-09: 10 mL

## 2024-02-09 MED ORDER — LORAZEPAM 2 MG/ML IJ SOLN
0.5000 mg | Freq: Once | INTRAMUSCULAR | Status: AC
  Administered 2024-02-09: 0.5 mg via INTRAVENOUS
  Filled 2024-02-09: qty 1

## 2024-02-09 MED ORDER — DIPHENHYDRAMINE HCL 25 MG PO CAPS
50.0000 mg | ORAL_CAPSULE | Freq: Once | ORAL | Status: AC
  Administered 2024-02-09: 50 mg via ORAL
  Filled 2024-02-09: qty 2

## 2024-02-09 MED ORDER — ACETAMINOPHEN 325 MG PO TABS
650.0000 mg | ORAL_TABLET | Freq: Once | ORAL | Status: AC
  Administered 2024-02-09: 650 mg via ORAL
  Filled 2024-02-09: qty 2

## 2024-02-09 MED ORDER — SODIUM CHLORIDE 0.9% FLUSH
10.0000 mL | INTRAVENOUS | Status: DC | PRN
  Administered 2024-02-09: 10 mL

## 2024-02-09 MED ORDER — TRASTUZUMAB-ANNS CHEMO 150 MG IV SOLR
750.0000 mg | Freq: Once | INTRAVENOUS | Status: AC
  Administered 2024-02-09: 750 mg via INTRAVENOUS
  Filled 2024-02-09: qty 35.72

## 2024-02-09 MED ORDER — HEPARIN SOD (PORK) LOCK FLUSH 100 UNIT/ML IV SOLN
500.0000 [IU] | Freq: Once | INTRAVENOUS | Status: AC | PRN
  Administered 2024-02-09: 500 [IU]

## 2024-02-09 NOTE — Progress Notes (Signed)
 [facility] Cancer Follow up:    Jenna Pagan, MD [address] [place] [address]   DIAGNOSIS:  Cancer Staging  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 [facility]) Staging form: Breast, AJCC 7th Edition - Clinical: Stage IV (TX, NX, M1) - Signed by Layla Sandria BROCKS, MD on 04/17/2015  Malignant neoplasm of upper-inner quadrant of left breast in female, estrogen receptor positive (HCC) Staging form: Breast, AJCC 7th Edition - Pathologic: Stage IV (M1) - Signed by Loretha Ash, MD on 11/17/2023 Specimen type: Core Needle Biopsy Histopathologic type: 9931 Laterality: Left   SUMMARY OF ONCOLOGIC HISTORY: 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 (HCC)  12/14/2012 Initial Diagnosis   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 Magrinat     05/23/2013 Initial Diagnosis   Malignant neoplasm of upper-inner quadrant of left breast in female, estrogen receptor positive (HCC)     CURRENT THERAPY: herceptin /Letrozole   INTERVAL HISTORY:  [name] 73 y.o. female returns for f/u of her breast cancer on treatment with Letrozole  and herceptin   Discussed the use of AI scribe software for clinical note transcription with the patient, who gave verbal consent to proceed.  History of Present Illness [name] is a 73 year old female with a history of cancer who presents with severe fatigue and weakness.  She has been  experiencing severe fatigue and weakness for the past three months, with symptoms progressively worsening. She feels extremely weak and tired, often needing to rest after short distances, such as moving from the living room to the bedroom. Some days, she can hardly walk to the bathroom.  She is currently taking letrozole , Valium , gabapentin , and Ozempic . She is considering discontinuing gabapentin  due to severe calf and thigh muscle pains, which she attributes to the medication, as her brother experienced similar symptoms. No recent discontinuation of medications, but she is contemplating stopping gabapentin  due to the pain.  Significant pain interferes with her sleep, contributing to her fatigue. She does not get much sleep due to the pain and sometimes falls asleep unintentionally while watching TV. This pain has been severe enough to prevent her from sleeping for the past two to three days.  She recalls her mother's experience with cancer, noting that her mother also became progressively weaker over time. She is concerned that her own cancer, although inactive, might be contributing to her weakness.  She reports frequent bowel movements, requiring the use of Imodium , and states that she urinates frequently. She experiences sharp pain when breathing deeply and describes her hands as feeling cold.  Rest of the pertinent 10 point ROS reviewed and neg.  Patient Active Problem List   Diagnosis Date Noted   Lightheadedness 11/04/2022   Sialadenitis    Port-A-Cath in place 03/21/2020   Obesity, morbid, BMI 50 or higher (HCC) 10/02/2015   Diabetes mellitus type 2 with neurological manifestations (HCC) 09/28/2014   Malignant neoplasm of upper-inner quadrant of left  breast in female, estrogen receptor positive (HCC) 05/23/2013   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 (HCC) 12/14/2012   Anxiety state 10/15/2006   HYPERTENSION, BENIGN SYSTEMIC 10/15/2006   Osteoarthritis of spine 10/15/2006   LUMBAR  SPINAL STENOSIS 10/15/2006   Sleep apnea 10/15/2006    is allergic to meperidine hcl, penicillins, and aspirin .  MEDICAL HISTORY: Past Medical History:  Diagnosis Date   Arthritis    Back pain    Breast cancer (HCC) dx'd 11/2012   left   Chest pain    COPD (chronic obstructive pulmonary disease) (HCC)    Diabetes mellitus without complication (HCC) 09/28/2014   Ear pain    Fatty liver 6/03   Hypertension    Lung disease    Lung metastases dx'd 11/2012   Obesity, unspecified    Other abnormal glucose    Suicide attempt (HCC) 1996   Syncope and collapse    Unspecified sleep apnea     SURGICAL HISTORY: Past Surgical History:  Procedure Laterality Date   CARDIAC CATHETERIZATION     2007   CHOLECYSTECTOMY     TUBAL LIGATION      SOCIAL HISTORY: Social History   Socioeconomic History   Marital status: Divorced    Spouse name: Not on file   Number of children: 4   Years of education: 12   Highest education level: 12th grade  Occupational History   Occupation: Disability   Occupation: retired-daycare  Tobacco Use   Smoking status: Former    Current packs/day: 0.00    Average packs/day: 3.0 packs/day for 5.0 years (15.0 ttl pk-yrs)    Types: Cigarettes    Start date: 08/19/1963    Quit date: 08/18/1968    Years since quitting: 55.5    Passive exposure: Past   Smokeless tobacco: Never  Vaping Use   Vaping status: Never Used  Substance and Sexual Activity   Alcohol use: No    Alcohol/week: 0.0 standard drinks of alcohol   Drug use: Yes    Types: Marijuana    Comment: Medical Marijuana   Sexual activity: Not Currently    Partners: Male  Other Topics Concern   Not on file  Social History Narrative   Health Care POA:    Emergency Contact: [name] [phone]   End of Life Plan:    Who lives with you: two grandchildren, friend, daughter   Any pets: 3 poodles   Diet: Pt has a variety of protein, starch, vegetables.  Pt is currently working on cutting back on  portions for weight loss.   Exercise: Pt has not regular exercise routine.  Occasionally walks around home.   Seatbelts: Pt reports wearing seatbelt occasionally.   Sun Exposure/Protection: Pt does not use sun protection   Hobbies: reading, playing on kindle, ebay         Currently in her home she keeps her granddaughter [name], 16, who is the daughter of the patient's daughter [name] (the patient refers to [name] as my adopted daughter); grandson [name], [place], who is [name]'s half-brother; daughter [name], and an [place] friend, [name], the patient's significant other. Daughter [name] is a Public Librarian, currently unemployed. Son [name] works as an personnel officer in [place]. Daughter [name] is currently in prison due to killing someone in a car accident. Daughter [name] died from aplastic anemia at the age of 40. The patient has a total of 4 grandchildren. She is not a church attender   Social Drivers of Health  Financial Resource Strain: High Risk (05/02/2023)   Overall Financial Resource Strain (CARDIA)    Difficulty of Paying Living Expenses: Very hard  Food Insecurity: Food Insecurity Present (11/17/2023)   Hunger Vital Sign    Worried About Running Out of Food in the Last Year: Often true    Ran Out of Food in the Last Year: Sometimes true  Transportation Needs: Unmet Transportation Needs (05/02/2023)   PRAPARE - Administrator, Civil Service (Medical): Yes    Lack of Transportation (Non-Medical): Yes  Physical Activity: Unknown (05/02/2023)   Exercise Vital Sign    Days of Exercise per Week: Patient declined    Minutes of Exercise per Session: Not on file  Stress: Stress Concern Present (05/02/2023)   [employer] of Occupational Health - Occupational Stress Questionnaire    Feeling of Stress : To some extent  Social Connections: Socially Isolated (05/02/2023)   Social Connection and Isolation Panel     Frequency of Communication with Friends and Family: Twice a week    Frequency of Social Gatherings with Friends and Family: Never    Attends Religious Services: Never    Database Administrator or Organizations: No    Attends Engineer, Structural: Not on file    Marital Status: Widowed  Intimate Partner Violence: Not At Risk (07/30/2022)   Humiliation, Afraid, Rape, and Kick questionnaire    Fear of Current or Ex-Partner: No    Emotionally Abused: No    Physically Abused: No    Sexually Abused: No    FAMILY HISTORY: Family History  Problem Relation Age of Onset   Coronary artery disease Father 52   Diabetes Father    Heart disease Father    Breast cancer Mother 22   Cancer Mother 76       breast   Aplastic anemia Daughter        died at age 47   Cancer Maternal Aunt 63       ovarian   Cancer Maternal Grandmother 84       ovarian   Cancer Paternal Aunt 34       ovarian/breast/breast   Coronary artery disease Sister 51   Coronary artery disease Brother 1    PHYSICAL EXAMINATION    Vitals:   02/09/24 1427  BP: (!) 164/72  Pulse: 70  Resp: 20  Temp: 98.2 F (36.8 C)  SpO2: 99%     Physical Exam Constitutional:      General: She is not in acute distress.    Appearance: Normal appearance. She is not toxic-appearing.     Comments: Examined in wheelchair  HENT:     Head: Normocephalic and atraumatic.     Mouth/Throat:     Mouth: Mucous membranes are moist.     Pharynx: Oropharynx is clear. No oropharyngeal exudate or posterior oropharyngeal erythema.   Eyes:     General: No scleral icterus.   Cardiovascular:     Rate and Rhythm: Normal rate and regular rhythm.     Pulses: Normal pulses.     Heart sounds: Normal heart sounds.  Pulmonary:     Effort: Pulmonary effort is normal.     Breath sounds: Normal breath sounds.  Abdominal:     General: Abdomen is flat.     Palpations: Abdomen is soft.     Tenderness: There is no abdominal tenderness.      Comments: Limited exam due to body habitus and exam in wheelchair   Musculoskeletal:  General: Swelling (Bilateral LE swelling symmetric) present.     Cervical back: Neck supple.  Lymphadenopathy:     Cervical: No cervical adenopathy.   Skin:    General: Skin is warm and dry.     Findings: No rash.   Neurological:     General: No focal deficit present.     Mental Status: She is alert.   Psychiatric:        Mood and Affect: Mood normal.        Behavior: Behavior normal.       ASSESSMENT and THERAPY PLAN:   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 [facility]) Audrina is a 73 year old woman with metastatic breast cancer to the lung noted in April 2014 (see oncologic history above).  Assessment and Plan Assessment & Plan Stable on Letrozole  and Herceptin . No new symptoms or physical exam findings suggestive of disease progression. - She is very reluctant to do scans. She has this intractable pain everywhere that makes it very hard to get on the table, stay still on the table. - Perform Guardant Reveal blood test to assess for active cancer. - Continue Herceptin  and letrozole  if Guardant Reveal is negative. - If positive, unfortunately we dont recommend using guardant reveal to monitor. - She says she wants to continue herceptin  and letrozole  without any monitoring, She understands this is not ideal. She wants to wait until there is clinical evidence of progression  Fatigue and weakness Chronic fatigue and weakness, worsening over three months. Not due to medication drowsiness. Possibly related to sleep issues.  Pain in limbs Severe calf and thigh pain. Muscle cramps interfere with sleep. Not cancer-related. - She doesn't want to continue gabapentin ,   Insomnia Chronic insomnia, possibly worsened by pain and fatigue. - Discuss insomnia management with palliative care provider, Levon, regarding Valium  dosage.        All questions were answered. The patient knows to call the  clinic with any problems, questions or concerns. We can certainly see the patient much sooner if necessary.  Total encounter time:30 minutes*in face-to-face visit time, chart review, lab review, care coordination, order entry, and documentation of the encounter time.  *Total Encounter Time as defined by the Centers for Medicare and Medicaid Services includes, in addition to the face-to-face time of a patient visit (documented in the note above) non-face-to-face time: obtaining and reviewing outside history, ordering and reviewing medications, tests or procedures, care coordination (communications with other health care professionals or caregivers) and documentation in the medical record.

## 2024-02-09 NOTE — Assessment & Plan Note (Signed)
 [name] is a 73 year old woman with metastatic breast cancer to the lung noted in April 2014 (see oncologic history above).  Breast Cancer Stable on Letrozole  and Herceptin . No new symptoms or physical exam findings suggestive of disease progression. -Continue Letrozole  and Herceptin  as prescribed. -Biannual scan required for metastasis monitoring.  -Discussed financial and pain concerns. No viable scan alternatives. -Consult Child psychotherapist for copay assistance. -Proceed with scan if assistance obtained.  Echocardiogram monitoring Biannual echocardiograms necessary for Herceptin -related cardiotoxicity monitoring. Stability noted, but continued monitoring essential. - Schedule echocardiogram for June.

## 2024-02-09 NOTE — Progress Notes (Unsigned)
 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 [facility]) Staging form: Breast, AJCC 7th Edition - Clinical: Stage IV (TX, NX, M1) - Signed by Layla Sandria BROCKS, MD on 04/17/2015  Malignant neoplasm of upper-inner quadrant of left breast in female, estrogen receptor positive (HCC) Staging form: Breast, AJCC 7th Edition - Pathologic: Stage IV (M1) - Signed by Loretha Ash, MD on 11/17/2023 Specimen type: Core Needle Biopsy Histopathologic type: 9931 Laterality: Left   SUMMARY OF ONCOLOGIC HISTORY: 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 (HCC)  12/14/2012 Initial Diagnosis   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 Magrinat     05/23/2013 Initial Diagnosis   Malignant neoplasm of upper-inner quadrant of left breast in female, estrogen receptor positive (HCC)     CURRENT THERAPY: herceptin /Letrozole   INTERVAL HISTORY:  Discussed the use of AI scribe software for clinical note transcription with the patient, who gave verbal consent to proceed.  [name] 73 y.o. female returns for f/u of her breast cancer on treatment with Letrozole  and herceptin    Rest of the pertinent 10 point ROS reviewed and neg.  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  MEDICAL HISTORY: Past Medical History:  Diagnosis Date   Arthritis    Back pain    Breast cancer (HCC) dx'd 11/2012   left   Chest pain    COPD (chronic obstructive pulmonary disease) (HCC)    Diabetes mellitus without complication (HCC) 09/28/2014   Ear pain    Fatty liver 6/03   Hypertension    Lung disease    Lung metastases dx'd 11/2012   Obesity, unspecified    Other abnormal glucose    Suicide attempt (HCC) 1996   Syncope and collapse    Unspecified sleep apnea     SURGICAL HISTORY: Past Surgical History:  Procedure Laterality Date   CARDIAC CATHETERIZATION     2007   CHOLECYSTECTOMY     TUBAL LIGATION      SOCIAL HISTORY: Social History   Socioeconomic History   Marital status: Divorced    Spouse name: Not on file   Number of children: 4   Years of education: 12   Highest education level: 12th grade  Occupational History   Occupation: Disability   Occupation: retired-daycare  Tobacco Use   Smoking status: Former    Current packs/day: 0.00    Average packs/day: 3.0 packs/day for 5.0 years (15.0 ttl pk-yrs)    Types: Cigarettes    Start date: 08/19/1963    Quit date: 08/18/1968    Years since quitting: 55.5    Passive exposure: Past   Smokeless tobacco: Never  Vaping Use   Vaping status: Never Used  Substance and Sexual Activity   Alcohol use: No    Alcohol/week: 0.0 standard drinks of alcohol    Drug use: Yes    Types: Marijuana    Comment: Medical Marijuana   Sexual activity: Not Currently    Partners: Male  Other Topics Concern   Not on file  Social History Narrative   Health Care POA:    Emergency Contact: Alyse Ruth [phone]   End of Life Plan:    Who lives with you: two grandchildren, friend, daughter   Any pets: 3 poodles   Diet: Pt has a variety of protein, starch, vegetables.  Pt is currently working on cutting back on portions for weight loss.   Exercise: Pt has not regular exercise routine.  Occasionally walks around home.   Seatbelts: Pt reports wearing seatbelt occasionally.   Sun Exposure/Protection: Pt does not use sun protection   Hobbies: reading, playing on kindle, ebay         Currently in her home she keeps her granddaughter Chuck Moss, 16, who is the daughter of the patient's daughter Rilla (the patient refers to [name] as my adopted daughter); grandson Deon Ivey, [place], who is [name]'s half-brother; daughter Butler, and an [place] friend, Bonner Ruth Forbes, the patient's significant other. Daughter Butler is a Public librarian, currently unemployed. Son Charlie Dillon Junior works as an Personnel officer in [place]. Daughter Rilla is currently in prison due to killing someone in a car accident. Daughter [name] died from aplastic anemia at the age of 7. The patient has a total of 4 grandchildren. She is not a church attender   Social Drivers of Health   Financial Resource Strain: High Risk (05/02/2023)   Overall Financial Resource Strain (CARDIA)    Difficulty of Paying Living Expenses: Very hard  Food Insecurity: Food Insecurity Present (11/17/2023)   Hunger Vital Sign    Worried About Running Out of Food in the Last Year: Often true    Ran Out of Food in the Last Year: Sometimes true  Transportation Needs: Unmet Transportation Needs (05/02/2023)   PRAPARE - Administrator, Civil Service (Medical): Yes    Lack of  Transportation (Non-Medical): Yes  Physical Activity: Unknown (05/02/2023)   Exercise Vital Sign    Days of Exercise per Week: Patient declined    Minutes of Exercise per Session: Not on file  Stress: Stress Concern Present (05/02/2023)   [employer] of Occupational Health - Occupational Stress Questionnaire    Feeling of Stress : To some extent  Social Connections: Socially Isolated (05/02/2023)   Social Connection and Isolation Panel    Frequency of Communication with Friends and Family: Twice a week    Frequency of Social Gatherings with Friends and Family: Never    Attends Religious Services: Never    Database administrator or Organizations: No    Attends Engineer, structural: Not on file    Marital Status: Widowed  Intimate Partner Violence: Not At Risk (07/30/2022)   Humiliation, Afraid, Rape,  and Kick questionnaire    Fear of Current or Ex-Partner: No    Emotionally Abused: No    Physically Abused: No    Sexually Abused: No    FAMILY HISTORY: Family History  Problem Relation Age of Onset   Coronary artery disease Father 31   Diabetes Father    Heart disease Father    Breast cancer Mother 39   Cancer Mother 70       breast   Aplastic anemia Daughter        died at age 23   Cancer Maternal Aunt 3       ovarian   Cancer Maternal Grandmother 47       ovarian   Cancer Paternal Aunt 75       ovarian/breast/breast   Coronary artery disease Sister 80   Coronary artery disease Brother 62    PHYSICAL EXAMINATION    Vitals:   02/09/24 1427  BP: (!) 164/72  Pulse: 70  Resp: 20  Temp: 98.2 F (36.8 C)  SpO2: 99%     Physical Exam Constitutional:      General: She is not in acute distress.    Appearance: Normal appearance. She is not toxic-appearing.     Comments: Examined in wheelchair  HENT:     Head: Normocephalic and atraumatic.     Mouth/Throat:     Mouth: Mucous membranes are moist.     Pharynx: Oropharynx is clear. No oropharyngeal  exudate or posterior oropharyngeal erythema.   Eyes:     General: No scleral icterus.   Cardiovascular:     Rate and Rhythm: Normal rate and regular rhythm.     Pulses: Normal pulses.     Heart sounds: Normal heart sounds.  Pulmonary:     Effort: Pulmonary effort is normal.     Breath sounds: Normal breath sounds.  Abdominal:     General: Abdomen is flat.     Palpations: Abdomen is soft.     Tenderness: There is no abdominal tenderness.     Comments: Limited exam due to body habitus and exam in wheelchair   Musculoskeletal:        General: Swelling (Bilateral LE swelling symmetric) present.     Cervical back: Neck supple.  Lymphadenopathy:     Cervical: No cervical adenopathy.   Skin:    General: Skin is warm and dry.     Findings: No rash.   Neurological:     General: No focal deficit present.     Mental Status: She is alert.   Psychiatric:        Mood and Affect: Mood normal.        Behavior: Behavior normal.       ASSESSMENT and THERAPY PLAN:   No problem-specific Assessment & Plan notes found for this encounter.     All questions were answered. The patient knows to call the clinic with any problems, questions or concerns. We can certainly see the patient much sooner if necessary.  Total encounter time:30 minutes*in face-to-face visit time, chart review, lab review, care coordination, order entry, and documentation of the encounter time.  *Total Encounter Time as defined by the Centers for Medicare and Medicaid Services includes, in addition to the face-to-face time of a patient visit (documented in the note above) non-face-to-face time: obtaining and reviewing outside history, ordering and reviewing medications, tests or procedures, care coordination (communications with other health care professionals or caregivers) and documentation in the medical record.

## 2024-02-09 NOTE — Progress Notes (Signed)
 Palliative Medicine [facility]  [phone] [phone]   Name: [name] Date: 02/09/2024 MRN: [id number]  DOB: 1951-01-26  Patient Care Team: Jenna Pagan, MD as PCP - General (Family Medicine) Jenna Ezra RAMAN, MD as Consulting Physician (Cardiology) Jenna Ash, MD as Consulting Physician (Hematology and Oncology)    INTERVAL HISTORY: [name] is a 73 y.o. female with medical history including metastatic breast cancer with numerous pulmonary nodules bilaterally s/p chemotherapy currently on herceptin  for maintenance. Now with complaints of uncontrolled back pain.  Palliative ask to see for symptom management.    SOCIAL HISTORY:     reports that she quit smoking about 55 years ago. Her smoking use included cigarettes. She started smoking about 60 years ago. She has a 15 pack-year smoking history. She has been exposed to tobacco smoke. She has never used smokeless tobacco. She reports current drug use. Drug: Marijuana. She reports that she does not drink alcohol.  ADVANCE DIRECTIVES:  None on file  CODE STATUS: Full code  PAST MEDICAL HISTORY: Past Medical History:  Diagnosis Date   Arthritis    Back pain    Breast cancer (HCC) dx'd 11/2012   left   Chest pain    COPD (chronic obstructive pulmonary disease) (HCC)    Diabetes mellitus without complication (HCC) 09/28/2014   Ear pain    Fatty liver 6/03   Hypertension    Lung disease    Lung metastases dx'd 11/2012   Obesity, unspecified    Other abnormal glucose    Suicide attempt (HCC) 1996   Syncope and collapse    Unspecified sleep apnea     ALLERGIES:  is allergic to meperidine hcl, penicillins, and aspirin .  MEDICATIONS:  Current Outpatient Medications  Medication Sig Dispense Refill   albuterol  (VENTOLIN  HFA) 108 (90 Base) MCG/ACT inhaler INHALE 2 PUFFS INTO THE LUNGS EVERY 6 HOURS AS NEEDED FOR WHEEZE 8.5 each 1   B-D ULTRAFINE III SHORT PEN 31G X 8 MM  MISC PLEASE USE TO INJECT INSULIN  4 TIMES DAILY. 200 each 3   Blood Glucose Monitoring Suppl (ONE TOUCH ULTRA 2) w/Device KIT 1 kit by Does not apply route QID. ICD 10-code: E11.49. 1 kit 0   diazepam  (VALIUM ) 5 MG tablet Take 1 tablet (5 mg total) by mouth every 12 (twelve) hours as needed for anxiety. 30 tablet 0   insulin  glargine (LANTUS  SOLOSTAR) 100 UNIT/ML Solostar Pen Inject 16 Units into the skin daily. 15 mL 6   insulin  lispro (HUMALOG ) 100 UNIT/ML KwikPen Inject 8-10 Units into the skin 3 (three) times daily. 3 mL 3   Insulin  Pen Needle (PEN NEEDLES) 30G X 8 MM MISC Please use to inject insulin  4 times daily. 200 each 3   letrozole  (FEMARA ) 2.5 MG tablet TAKE 1 TABLET BY MOUTH EVERY DAY 90 tablet 12   loperamide  (IMODIUM ) 2 MG capsule Take 1 capsule (2 mg total) by mouth as needed for diarrhea or loose stools. 30 capsule 6   ondansetron  (ZOFRAN ) 8 MG tablet TAKE 1 TABLET BY MOUTH EVERY 8 HOURS AS NEEDED 20 tablet 2   ONETOUCH ULTRA test strip USE AS INSTRUCTED TO CHECK ONCE DAILY 100 strip 12   OXYGEN  Inhale into the lungs.     polyethylene glycol powder (MIRALAX ) 17 GM/SCOOP powder Take 1 capful twice daily. 238 g 6   Semaglutide , 2 MG/DOSE, (OZEMPIC , 2 MG/DOSE,) 8 MG/3ML SOPN Inject 2 mg into the skin once a week.  3 mL 3   No current facility-administered medications for this visit.   Facility-Administered Medications Ordered in Other Visits  Medication Dose Route Frequency Provider Last Rate Last Admin   sodium chloride  flush (NS) 0.9 % injection 10 mL  10 mL Intracatheter PRN Iruku, Praveena, MD   10 mL at 02/09/24 1651    VITAL SIGNS: There were no vitals taken for this visit. There were no vitals filed for this visit.   Estimated body mass index is 51.49 kg/m as calculated from the following:   Height as of 01/13/24: 5' 4 (1.626 m).   Weight as of an earlier encounter on 02/09/24: 300 lb (136.1 kg).   PERFORMANCE STATUS (ECOG) : 1 - Symptomatic but completely  ambulatory  Physical Exam General: NAD Cardiovascular: regular rate and rhythm Pulmonary: normal breathing pattern Extremities: no edema, no joint deformities Skin: no rashes Neurological: AAO x3  IMPRESSION: Discussed the use of AI scribe software for clinical note transcription with the patient, who gave verbal consent to proceed.  History of Present Illness [name] is a 73 year old female with manage stated breast cancer who presents to clinic for symptom management follow-up.  No acute distress noted.  Patient continues to take things one day at a time.  Reports ongoing fatigue.  Denies concerns with nausea, vomiting, or diarrhea.  Constipation controlled per patient. Does not require daily use of Miralax .  Appetite is fair.  Some days are better than others.  We discussed her pain and discomfort at length. She experiences severe muscle cramps in her calves and the backs of her thighs. These cramps are particularly severe, causing significant discomfort, but are not accompanied by redness. She associates the onset of these cramps with the use of gabapentin , which she has since discontinued.  [name] struggles with sleep disturbances, finding it difficult to sleep through the night. She has tried various medications, including Ativan  and Valium , but finds that they affect her differently. Ativan  allows her to sleep well until about 10 or 11 PM, after which she is wide awake for the rest of the night. With Valium , she might sleep for two hours straight but remains a light sleeper, easily disturbed by noise. She has also tried melatonin and Benadryl  without significant improvement in her sleep quality. She is requesting an increase in valium  however education provided on concern for use to aid in sleep in addition to her current compromised respiratory state. he has not seen a pulmonologist since 2018 and primarily receives care from her primary care provider however has not fully  established with Dr. Theophilus since previous provider left practice. Encouraged patient on importance of follow-ups.   No changes to current regimen at this time. We will continue to closely support and follow as needed.   Goals of care Ms. Pore she has concerns that her body may be starting to physically show decline and that she will no longer be able to tolerate significant treatments in the future.  States she is questioning the need for further scans as she does not feel she will do anything differently based on the imaging results.  We discussed the purpose of echocardiogram and CT scans to assist the oncologist with directing further treatment paths.  Cyniah states she is not a candidate for any surgical procedures and I am sure that she will consider any additional options outside of her current regimen specifically stating she is not interested in pursuing radiation if ever cancer will still progress.  She empathetically approach discussions regarding her healthcare limitations and wishes.  Patient is clear and expressed wishes to continue to treat the treatable allow her every opportunity to continue to thrive as she has no desire to give up at this time.  She does however states in the event of further health decline her desire would be for natural death with no life-sustaining measures.   I discussed the importance of continued conversation with family and their medical providers regarding overall plan of care and treatment options, ensuring decisions are within the context of the patients values and GOCs. Assessment & Plan Anxiety/insomnia Patient reports anxiety is controlled with the Valium  however she is still challenged with insomnia. Previous melatonin and Tylenol  PM trials ineffective or adverse. - Consider trial of Unisom. - Evaluate medication regimen for sleep improvement. -Valium  5 mg twice daily as needed.  Pain management issues Chronic pain with intermittent flares.  Previous oxycodone . Has not refilled since February 2025. Gabapentin  effective at higher doses however due to patient reporting severe cramps in calves and thighs, which she attributes to gabapentin  she chose to self discontinue.  - Discontinue gabapentin . Consider restarting opioids if pain worsens. -Advised against starting and restarting opioids and adjustments. Will continue to closely monitor.  Cancer-related fatigue and weakness Chronic fatigue and weakness from prolonged cancer treatment, causing mobility issues and falls. Insurance limits home health access.   Goals of Care She prefers natural disease progression without aggressive interventions, avoiding unnecessary tests or treatments.  - Clear and expressed wishes to continue to treat the treatable allow her every opportunity to continue to thrive.  Patient's speech specifically if cancer was to progress she would not be interested in pursuing further radiation therapy or more extensive treatments. - Expresses wishes for natural death with no life-sustaining measures when her time comes  Follow-up -See patient back in the clinic in 4-6 weeks.  Sooner if needed.  Patient expressed understanding and was in agreement with this plan. She also understands that She can call the clinic at any time with any questions, concerns, or complaints.   Any controlled substances utilized were prescribed in the context of palliative care. PDMP has been reviewed.   Visit consisted of counseling and education dealing with the complex and emotionally intense issues of symptom management and palliative care in the setting of serious and potentially life-threatening illness.  Levon Borer, AGPCNP-BC  Palliative Medicine Team/[facility]

## 2024-02-10 ENCOUNTER — Encounter: Payer: Self-pay | Admitting: Hematology and Oncology

## 2024-02-15 ENCOUNTER — Encounter: Payer: Self-pay | Admitting: *Deleted

## 2024-02-15 ENCOUNTER — Other Ambulatory Visit: Payer: Self-pay | Admitting: *Deleted

## 2024-02-15 MED ORDER — CIPROFLOXACIN HCL 500 MG PO TABS: 500.0000 mg | ORAL_TABLET | Freq: Two times a day (BID) | ORAL | 0 refills | Status: DC

## 2024-02-17 DIAGNOSIS — C50112 Malignant neoplasm of central portion of left female breast: Secondary | ICD-10-CM | POA: Diagnosis not present

## 2024-02-20 ENCOUNTER — Other Ambulatory Visit (HOSPITAL_COMMUNITY): Payer: Self-pay

## 2024-02-24 DIAGNOSIS — E1149 Type 2 diabetes mellitus with other diabetic neurological complication: Secondary | ICD-10-CM | POA: Diagnosis not present

## 2024-02-29 ENCOUNTER — Encounter: Payer: Self-pay | Admitting: Hematology and Oncology

## 2024-03-02 LAB — GUARDANT REVEAL

## 2024-03-04 ENCOUNTER — Other Ambulatory Visit: Payer: Self-pay

## 2024-03-04 DIAGNOSIS — C50919 Malignant neoplasm of unspecified site of unspecified female breast: Secondary | ICD-10-CM

## 2024-03-07 ENCOUNTER — Inpatient Hospital Stay: Admitting: Nurse Practitioner

## 2024-03-07 ENCOUNTER — Inpatient Hospital Stay

## 2024-03-07 ENCOUNTER — Inpatient Hospital Stay: Attending: Oncology

## 2024-03-07 VITALS — BP 152/66 | HR 66 | Temp 97.6°F | Resp 20

## 2024-03-07 DIAGNOSIS — C7800 Secondary malignant neoplasm of unspecified lung: Secondary | ICD-10-CM | POA: Diagnosis not present

## 2024-03-07 DIAGNOSIS — C50919 Malignant neoplasm of unspecified site of unspecified female breast: Secondary | ICD-10-CM

## 2024-03-07 DIAGNOSIS — Z170 Estrogen receptor positive status [ER+]: Secondary | ICD-10-CM | POA: Diagnosis not present

## 2024-03-07 DIAGNOSIS — Z79811 Long term (current) use of aromatase inhibitors: Secondary | ICD-10-CM | POA: Insufficient documentation

## 2024-03-07 DIAGNOSIS — Z5112 Encounter for antineoplastic immunotherapy: Secondary | ICD-10-CM | POA: Diagnosis not present

## 2024-03-07 DIAGNOSIS — C50212 Malignant neoplasm of upper-inner quadrant of left female breast: Secondary | ICD-10-CM | POA: Insufficient documentation

## 2024-03-07 LAB — CBC WITH DIFFERENTIAL (CANCER CENTER ONLY)
Abs Immature Granulocytes: 0.01 K/uL (ref 0.00–0.07)
Basophils Absolute: 0 K/uL (ref 0.0–0.1)
Basophils Relative: 0 %
Eosinophils Absolute: 0.1 K/uL (ref 0.0–0.5)
Eosinophils Relative: 2 %
HCT: 41.4 % (ref 36.0–46.0)
Hemoglobin: 13.6 g/dL (ref 12.0–15.0)
Immature Granulocytes: 0 %
Lymphocytes Relative: 25 %
Lymphs Abs: 1.6 K/uL (ref 0.7–4.0)
MCH: 28.5 pg (ref 26.0–34.0)
MCHC: 32.9 g/dL (ref 30.0–36.0)
MCV: 86.8 fL (ref 80.0–100.0)
Monocytes Absolute: 0.5 K/uL (ref 0.1–1.0)
Monocytes Relative: 9 %
Neutro Abs: 4 K/uL (ref 1.7–7.7)
Neutrophils Relative %: 64 %
Platelet Count: 215 K/uL (ref 150–400)
RBC: 4.77 MIL/uL (ref 3.87–5.11)
RDW: 14.1 % (ref 11.5–15.5)
WBC Count: 6.3 K/uL (ref 4.0–10.5)
nRBC: 0 % (ref 0.0–0.2)

## 2024-03-07 LAB — CMP (CANCER CENTER ONLY)
ALT: 62 U/L — ABNORMAL HIGH (ref 0–44)
AST: 50 U/L — ABNORMAL HIGH (ref 15–41)
Albumin: 3.6 g/dL (ref 3.5–5.0)
Alkaline Phosphatase: 88 U/L (ref 38–126)
Anion gap: 6 (ref 5–15)
BUN: 16 mg/dL (ref 8–23)
CO2: 32 mmol/L (ref 22–32)
Calcium: 9.2 mg/dL (ref 8.9–10.3)
Chloride: 102 mmol/L (ref 98–111)
Creatinine: 0.72 mg/dL (ref 0.44–1.00)
GFR, Estimated: >60 mL/min (ref >60)
Glucose, Bld: 192 mg/dL — ABNORMAL HIGH (ref 70–99)
Potassium: 4.2 mmol/L (ref 3.5–5.1)
Sodium: 140 mmol/L (ref 135–145)
Total Bilirubin: 0.3 mg/dL (ref 0.0–1.2)
Total Protein: 7.4 g/dL (ref 6.5–8.1)

## 2024-03-07 MED ORDER — SODIUM CHLORIDE 0.9 % IV SOLN: Freq: Once | INTRAVENOUS | Status: AC

## 2024-03-07 MED ORDER — HEPARIN SOD (PORK) LOCK FLUSH 100 UNIT/ML IV SOLN
500.0000 [IU] | Freq: Once | INTRAVENOUS | Status: AC | PRN
Start: 2024-03-07 — End: 2024-03-07
  Administered 2024-03-07: 500 [IU]

## 2024-03-07 MED ORDER — SODIUM CHLORIDE 0.9% FLUSH
10.0000 mL | INTRAVENOUS | Status: DC | PRN
  Administered 2024-03-07: 10 mL

## 2024-03-07 MED ORDER — TRASTUZUMAB-ANNS CHEMO 150 MG IV SOLR
750.0000 mg | Freq: Once | INTRAVENOUS | Status: AC
  Administered 2024-03-07: 750 mg via INTRAVENOUS
  Filled 2024-03-07: qty 35.71

## 2024-03-07 MED ORDER — LORAZEPAM 2 MG/ML IJ SOLN
0.5000 mg | Freq: Once | INTRAMUSCULAR | Status: AC
  Administered 2024-03-07: 0.5 mg via INTRAVENOUS
  Filled 2024-03-07: qty 1

## 2024-03-07 MED ORDER — ACETAMINOPHEN 325 MG PO TABS
650.0000 mg | ORAL_TABLET | Freq: Once | ORAL | Status: AC
Start: 2024-03-07 — End: 2024-03-07
  Administered 2024-03-07: 650 mg via ORAL
  Filled 2024-03-07: qty 2

## 2024-03-07 MED ORDER — DIPHENHYDRAMINE HCL 25 MG PO CAPS
50.0000 mg | ORAL_CAPSULE | Freq: Once | ORAL | Status: AC
Start: 2024-03-07 — End: 2024-03-07
  Administered 2024-03-07: 50 mg via ORAL
  Filled 2024-03-07: qty 2

## 2024-03-07 NOTE — Patient Instructions (Signed)
 [facility] - A DEPT OF [facility]  Discharge Instructions: Thank you for choosing [facility] to provide your oncology and hematology care.   If you have a lab appointment with the Cancer Center, please go directly to the Cancer Center and check in at the registration area.   Wear comfortable clothing and clothing appropriate for easy access to any Portacath or PICC line.   We strive to give you quality time with your provider. You may need to reschedule your appointment if you arrive late (15 or more minutes).  Arriving late affects you and other patients whose appointments are after yours.  Also, if you miss three or more appointments without notifying the office, you may be dismissed from the clinic at the provider's discretion.      For prescription refill requests, have your pharmacy contact our office and allow 72 hours for refills to be completed.    Today you received the following chemotherapy and/or immunotherapy agents kanjinti      To help prevent nausea and vomiting after your treatment, we encourage you to take your nausea medication as directed.  BELOW ARE SYMPTOMS THAT SHOULD BE REPORTED IMMEDIATELY: *FEVER GREATER THAN 100.4 F (38 C) OR HIGHER *CHILLS OR SWEATING *NAUSEA AND VOMITING THAT IS NOT CONTROLLED WITH YOUR NAUSEA MEDICATION *UNUSUAL SHORTNESS OF BREATH *UNUSUAL BRUISING OR BLEEDING *URINARY PROBLEMS (pain or burning when urinating, or frequent urination) *BOWEL PROBLEMS (unusual diarrhea, constipation, pain near the anus) TENDERNESS IN MOUTH AND THROAT WITH OR WITHOUT PRESENCE OF ULCERS (sore throat, sores in mouth, or a toothache) UNUSUAL RASH, SWELLING OR PAIN  UNUSUAL VAGINAL DISCHARGE OR ITCHING   Items with * indicate a potential emergency and should be followed up as soon as possible or go to the Emergency Department if any problems should occur.  Please show the CHEMOTHERAPY ALERT CARD or IMMUNOTHERAPY  ALERT CARD at check-in to the Emergency Department and triage nurse.  Should you have questions after your visit or need to cancel or reschedule your appointment, please contact [facility] - A DEPT OF [facility]  Dept: [phone]  and follow the prompts.  Office hours are 8:00 a.m. to 4:30 p.m. Monday - Friday. Please note that voicemails left after 4:00 p.m. may not be returned until the following business day.  We are closed weekends and major holidays. You have access to a nurse at all times for urgent questions. Please call the main number to the clinic Dept: [phone] and follow the prompts.   For any non-urgent questions, you may also contact your provider using MyChart. We now offer e-Visits for anyone 81 and older to request care online for non-urgent symptoms. For details visit [email or web address].   Also download the MyChart app! Go to the app store, search "MyChart", open the app, select [facility], and log in with your MyChart username and password.

## 2024-03-08 ENCOUNTER — Encounter: Payer: Self-pay | Admitting: Hematology and Oncology

## 2024-03-08 NOTE — Progress Notes (Signed)
 erroneous

## 2024-03-09 DIAGNOSIS — I5020 Unspecified systolic (congestive) heart failure: Secondary | ICD-10-CM | POA: Diagnosis not present

## 2024-03-09 DIAGNOSIS — M25569 Pain in unspecified knee: Secondary | ICD-10-CM | POA: Diagnosis not present

## 2024-03-09 DIAGNOSIS — R062 Wheezing: Secondary | ICD-10-CM | POA: Diagnosis not present

## 2024-03-10 DIAGNOSIS — M25569 Pain in unspecified knee: Secondary | ICD-10-CM | POA: Diagnosis not present

## 2024-03-10 DIAGNOSIS — R062 Wheezing: Secondary | ICD-10-CM | POA: Diagnosis not present

## 2024-03-10 DIAGNOSIS — I5020 Unspecified systolic (congestive) heart failure: Secondary | ICD-10-CM | POA: Diagnosis not present

## 2024-03-18 ENCOUNTER — Other Ambulatory Visit (HOSPITAL_COMMUNITY): Payer: Self-pay

## 2024-03-26 DIAGNOSIS — E1149 Type 2 diabetes mellitus with other diabetic neurological complication: Secondary | ICD-10-CM | POA: Diagnosis not present

## 2024-03-31 ENCOUNTER — Other Ambulatory Visit: Payer: Self-pay

## 2024-03-31 DIAGNOSIS — C50919 Malignant neoplasm of unspecified site of unspecified female breast: Secondary | ICD-10-CM

## 2024-04-04 ENCOUNTER — Inpatient Hospital Stay: Admitting: Nurse Practitioner

## 2024-04-04 ENCOUNTER — Inpatient Hospital Stay: Attending: Oncology

## 2024-04-04 ENCOUNTER — Inpatient Hospital Stay

## 2024-04-04 ENCOUNTER — Encounter: Payer: Self-pay | Admitting: Hematology and Oncology

## 2024-04-04 VITALS — BP 141/67 | HR 72 | Temp 98.2°F | Resp 18 | Wt 300.0 lb

## 2024-04-04 DIAGNOSIS — C7801 Secondary malignant neoplasm of right lung: Secondary | ICD-10-CM | POA: Insufficient documentation

## 2024-04-04 DIAGNOSIS — Z5112 Encounter for antineoplastic immunotherapy: Secondary | ICD-10-CM | POA: Diagnosis not present

## 2024-04-04 DIAGNOSIS — F419 Anxiety disorder, unspecified: Secondary | ICD-10-CM | POA: Diagnosis not present

## 2024-04-04 DIAGNOSIS — R531 Weakness: Secondary | ICD-10-CM | POA: Diagnosis not present

## 2024-04-04 DIAGNOSIS — Z515 Encounter for palliative care: Secondary | ICD-10-CM | POA: Diagnosis not present

## 2024-04-04 DIAGNOSIS — C50919 Malignant neoplasm of unspecified site of unspecified female breast: Secondary | ICD-10-CM

## 2024-04-04 DIAGNOSIS — M549 Dorsalgia, unspecified: Secondary | ICD-10-CM | POA: Diagnosis not present

## 2024-04-04 DIAGNOSIS — G8929 Other chronic pain: Secondary | ICD-10-CM | POA: Insufficient documentation

## 2024-04-04 DIAGNOSIS — K5909 Other constipation: Secondary | ICD-10-CM | POA: Insufficient documentation

## 2024-04-04 DIAGNOSIS — K5900 Constipation, unspecified: Secondary | ICD-10-CM

## 2024-04-04 DIAGNOSIS — R252 Cramp and spasm: Secondary | ICD-10-CM | POA: Diagnosis not present

## 2024-04-04 DIAGNOSIS — Z79811 Long term (current) use of aromatase inhibitors: Secondary | ICD-10-CM | POA: Insufficient documentation

## 2024-04-04 DIAGNOSIS — R530 Neoplastic (malignant) related fatigue: Secondary | ICD-10-CM

## 2024-04-04 DIAGNOSIS — G4700 Insomnia, unspecified: Secondary | ICD-10-CM | POA: Diagnosis not present

## 2024-04-04 DIAGNOSIS — C50912 Malignant neoplasm of unspecified site of left female breast: Secondary | ICD-10-CM | POA: Diagnosis not present

## 2024-04-04 DIAGNOSIS — C7802 Secondary malignant neoplasm of left lung: Secondary | ICD-10-CM | POA: Insufficient documentation

## 2024-04-04 DIAGNOSIS — G4709 Other insomnia: Secondary | ICD-10-CM

## 2024-04-04 DIAGNOSIS — R197 Diarrhea, unspecified: Secondary | ICD-10-CM | POA: Insufficient documentation

## 2024-04-04 DIAGNOSIS — Z87891 Personal history of nicotine dependence: Secondary | ICD-10-CM | POA: Diagnosis not present

## 2024-04-04 DIAGNOSIS — Z95828 Presence of other vascular implants and grafts: Secondary | ICD-10-CM

## 2024-04-04 DIAGNOSIS — C7800 Secondary malignant neoplasm of unspecified lung: Secondary | ICD-10-CM

## 2024-04-04 LAB — CMP (CANCER CENTER ONLY)
ALT: 51 U/L — ABNORMAL HIGH (ref 0–44)
AST: 37 U/L (ref 15–41)
Albumin: 3.7 g/dL (ref 3.5–5.0)
Alkaline Phosphatase: 78 U/L (ref 38–126)
Anion gap: 7 (ref 5–15)
BUN: 15 mg/dL (ref 8–23)
CO2: 30 mmol/L (ref 22–32)
Calcium: 9.1 mg/dL (ref 8.9–10.3)
Chloride: 103 mmol/L (ref 98–111)
Creatinine: 0.74 mg/dL (ref 0.44–1.00)
GFR, Estimated: >60 mL/min (ref >60)
Glucose, Bld: 197 mg/dL — ABNORMAL HIGH (ref 70–99)
Potassium: 4 mmol/L (ref 3.5–5.1)
Sodium: 140 mmol/L (ref 135–145)
Total Bilirubin: 0.4 mg/dL (ref 0.0–1.2)
Total Protein: 7.2 g/dL (ref 6.5–8.1)

## 2024-04-04 LAB — CBC WITH DIFFERENTIAL (CANCER CENTER ONLY)
Abs Immature Granulocytes: 0.02 K/uL (ref 0.00–0.07)
Basophils Absolute: 0 K/uL (ref 0.0–0.1)
Basophils Relative: 1 %
Eosinophils Absolute: 0.1 K/uL (ref 0.0–0.5)
Eosinophils Relative: 2 %
HCT: 41 % (ref 36.0–46.0)
Hemoglobin: 13.7 g/dL (ref 12.0–15.0)
Immature Granulocytes: 0 %
Lymphocytes Relative: 28 %
Lymphs Abs: 1.8 K/uL (ref 0.7–4.0)
MCH: 29.2 pg (ref 26.0–34.0)
MCHC: 33.4 g/dL (ref 30.0–36.0)
MCV: 87.4 fL (ref 80.0–100.0)
Monocytes Absolute: 0.5 K/uL (ref 0.1–1.0)
Monocytes Relative: 8 %
Neutro Abs: 3.8 K/uL (ref 1.7–7.7)
Neutrophils Relative %: 61 %
Platelet Count: 225 K/uL (ref 150–400)
RBC: 4.69 MIL/uL (ref 3.87–5.11)
RDW: 14.3 % (ref 11.5–15.5)
WBC Count: 6.2 K/uL (ref 4.0–10.5)
nRBC: 0 % (ref 0.0–0.2)

## 2024-04-04 MED ORDER — LORAZEPAM 2 MG/ML IJ SOLN: 0.5000 mg | Freq: Once | INTRAMUSCULAR | Status: DC

## 2024-04-04 MED ORDER — HEPARIN SOD (PORK) LOCK FLUSH 100 UNIT/ML IV SOLN: 500.0000 [IU] | Freq: Once | INTRAVENOUS | Status: DC | PRN

## 2024-04-04 MED ORDER — TRASTUZUMAB-ANNS CHEMO 150 MG IV SOLR
750.0000 mg | Freq: Once | INTRAVENOUS | Status: AC
  Administered 2024-04-04: 750 mg via INTRAVENOUS
  Filled 2024-04-04: qty 35.71

## 2024-04-04 MED ORDER — SODIUM CHLORIDE 0.9% FLUSH: 10.0000 mL | INTRAVENOUS | Status: DC | PRN

## 2024-04-04 MED ORDER — SODIUM CHLORIDE 0.9 % IV SOLN: Freq: Once | INTRAVENOUS | Status: AC

## 2024-04-04 MED ORDER — DIPHENHYDRAMINE HCL 25 MG PO CAPS
50.0000 mg | ORAL_CAPSULE | Freq: Once | ORAL | Status: AC
  Administered 2024-04-04: 50 mg via ORAL
  Filled 2024-04-04: qty 2

## 2024-04-04 MED ORDER — SODIUM CHLORIDE 0.9% FLUSH
10.0000 mL | Freq: Once | INTRAVENOUS | Status: AC
  Administered 2024-04-04: 10 mL

## 2024-04-04 MED ORDER — ACETAMINOPHEN 325 MG PO TABS
650.0000 mg | ORAL_TABLET | Freq: Once | ORAL | Status: AC
  Administered 2024-04-04: 650 mg via ORAL
  Filled 2024-04-04: qty 2

## 2024-04-04 NOTE — Patient Instructions (Signed)

## 2024-04-05 NOTE — Progress Notes (Signed)
 Palliative Medicine [facility]  [phone] [phone]   Name: [name] Date: 04/05/2024 MRN: [id number]  DOB: 02-Jan-1951  Patient Care Team: [name], MD as PCP - General (Family Medicine) Rolan Ezra RAMAN, MD as Consulting Physician (Cardiology) Loretha Ash, MD as Consulting Physician (Hematology and Oncology)    INTERVAL HISTORY: [name] is a 73 y.o. female with medical history including metastatic breast cancer with numerous pulmonary nodules bilaterally s/p chemotherapy currently on herceptin  for maintenance. Now with complaints of uncontrolled back pain.  Palliative ask to see for symptom management.    SOCIAL HISTORY:     reports that she quit smoking about 55 years ago. Her smoking use included cigarettes. She started smoking about 60 years ago. She has a 15 pack-year smoking history. She has been exposed to tobacco smoke. She has never used smokeless tobacco. She reports current drug use. Drug: Marijuana. She reports that she does not drink alcohol.  ADVANCE DIRECTIVES:  None on file  CODE STATUS: Full code  PAST MEDICAL HISTORY: Past Medical History:  Diagnosis Date   Arthritis    Back pain    Breast cancer (HCC) dx'd 11/2012   left   Chest pain    COPD (chronic obstructive pulmonary disease) (HCC)    Diabetes mellitus without complication (HCC) 09/28/2014   Ear pain    Fatty liver 6/03   Hypertension    Lung disease    Lung metastases dx'd 11/2012   Obesity, unspecified    Other abnormal glucose    Suicide attempt (HCC) 1996   Syncope and collapse    Unspecified sleep apnea     ALLERGIES:  is allergic to meperidine hcl, penicillins, and aspirin .  MEDICATIONS:  Current Outpatient Medications  Medication Sig Dispense Refill   ciprofloxacin  (CIPRO ) 500 MG tablet Take 1 tablet (500 mg total) by mouth 2 (two) times daily. 14 tablet 0   albuterol  (VENTOLIN  HFA) 108 (90 Base) MCG/ACT inhaler INHALE 2  PUFFS INTO THE LUNGS EVERY 6 HOURS AS NEEDED FOR WHEEZE 8.5 each 1   B-D ULTRAFINE III SHORT PEN 31G X 8 MM MISC PLEASE USE TO INJECT INSULIN  4 TIMES DAILY. 200 each 3   Blood Glucose Monitoring Suppl (ONE TOUCH ULTRA 2) w/Device KIT 1 kit by Does not apply route QID. ICD 10-code: E11.49. 1 kit 0   diazepam  (VALIUM ) 5 MG tablet Take 1 tablet (5 mg total) by mouth every 12 (twelve) hours as needed for anxiety. 30 tablet 0   insulin  glargine (LANTUS  SOLOSTAR) 100 UNIT/ML Solostar Pen Inject 16 Units into the skin daily. 15 mL 6   insulin  lispro (HUMALOG ) 100 UNIT/ML KwikPen Inject 8-10 Units into the skin 3 (three) times daily. 3 mL 3   Insulin  Pen Needle (PEN NEEDLES) 30G X 8 MM MISC Please use to inject insulin  4 times daily. 200 each 3   letrozole  (FEMARA ) 2.5 MG tablet TAKE 1 TABLET BY MOUTH EVERY DAY 90 tablet 12   loperamide  (IMODIUM ) 2 MG capsule Take 1 capsule (2 mg total) by mouth as needed for diarrhea or loose stools. 30 capsule 6   ondansetron  (ZOFRAN ) 8 MG tablet TAKE 1 TABLET BY MOUTH EVERY 8 HOURS AS NEEDED 20 tablet 2   ONETOUCH ULTRA test strip USE AS INSTRUCTED TO CHECK ONCE DAILY 100 strip 12   OXYGEN  Inhale into the lungs.     polyethylene glycol powder (MIRALAX ) 17 GM/SCOOP powder Take 1 capful twice daily. 238  g 6   Semaglutide , 2 MG/DOSE, (OZEMPIC , 2 MG/DOSE,) 8 MG/3ML SOPN Inject 2 mg into the skin once a week. 3 mL 3   No current facility-administered medications for this visit.    VITAL SIGNS: There were no vitals taken for this visit. There were no vitals filed for this visit.   Estimated body mass index is 51.49 kg/m as calculated from the following:   Height as of 01/13/24: 5' 4 (1.626 m).   Weight as of an earlier encounter on 04/04/24: 300 lb (136.1 kg).   PERFORMANCE STATUS (ECOG) : 1 - Symptomatic but completely ambulatory  Physical Exam General: NAD Cardiovascular: regular rate and rhythm Pulmonary: normal breathing pattern Extremities: no edema, no  joint deformities Skin: no rashes Neurological: AAO x3  IMPRESSION: Discussed the use of AI scribe software for clinical note transcription with the patient, who gave verbal consent to proceed.  History of Present Illness [name] is a 73 year old female with manage stated breast cancer who presents to clinic for symptom management follow-up.  No acute distress noted.  Patient continues to take things one day at a time.  Reports ongoing fatigue.  Denies concerns with nausea. Does not require daily use of Miralax .  Appetite is fair.  Some days are better than others. She has a new pet dog, which she rescued, and is attentive to its needs, ensuring it does not gain too much weight due to its three-legged condition.  She has issues with alternating constipation and diarrhea. She experiences diarrhea for a week, then takes medication to stop it, which leads to constipation. If she does not have a bowel movement for two days, she uses Miralax . Both constipation and diarrhea exacerbate her back pain.  We discussed her pain and discomfort at length. She experiences severe muscle cramps in her calves and the backs of her thighs. These cramps are particularly severe, causing significant discomfort, but are not accompanied by redness. She associates the onset of these cramps with the use of gabapentin , which she has since discontinued. Last refill was on 01/24/24.  [name] struggles with sleep disturbances, finding it difficult to sleep through the night. She has tried various medications, including Ativan  and Valium , but finds that they affect her differently. She recently restarted Valium  picking up a prescription on 7/22 from a May's prescription. Some noticeable improvement in her anxiety and sleep pattern. She is requesting to increase frequency of Valium . At this time will continue at current dosing and closely monitor.    No changes to current regimen at this time. We will continue to closely  support and follow as needed.   Goals of care [name] she has concerns that her body may be starting to physically show decline and that she will no longer be able to tolerate significant treatments in the future.  States she is questioning the need for further scans as she does not feel she will do anything differently based on the imaging results.  We discussed the purpose of echocardiogram and CT scans to assist the oncologist with directing further treatment paths.  [name] states she is not a candidate for any surgical procedures and I am sure that she will consider any additional options outside of her current regimen specifically stating she is not interested in pursuing radiation if ever cancer will still progress.  She empathetically approach discussions regarding her healthcare limitations and wishes.  Patient is clear and expressed wishes to continue to treat the treatable allow  her every opportunity to continue to thrive as she has no desire to give up at this time.  She does however states in the event of further health decline her desire would be for natural death with no life-sustaining measures.   I discussed the importance of continued conversation with family and their medical providers regarding overall plan of care and treatment options, ensuring decisions are within the context of the patients values and GOCs. Assessment & Plan Anxiety/insomnia Patient reports anxiety is controlled with the Valium  however she is still challenged with insomnia. Previous melatonin and Tylenol  PM trials ineffective or adverse. - Consider trial of Unisom. - Evaluate medication regimen for sleep improvement. -Valium  5 mg twice daily as needed.  Pain management issues Chronic pain with intermittent flares. Previous oxycodone . Has not refilled since February 2025. Gabapentin  effective at higher doses however due to patient reporting severe cramps in calves and thighs, which she attributes to gabapentin   she chose to self discontinue.  - Discontinue gabapentin . Consider restarting opioids if pain worsens. -Advised against starting and restarting opioids and adjustments. Will continue to closely monitor.  Cancer-related fatigue and weakness Chronic fatigue and weakness from prolonged cancer treatment, causing mobility issues and falls. Insurance limits home health access.   Goals of Care She prefers natural disease progression without aggressive interventions, avoiding unnecessary tests or treatments.  - Clear and expressed wishes to continue to treat the treatable allow her every opportunity to continue to thrive.  Patient's speech specifically if cancer was to progress she would not be interested in pursuing further radiation therapy or more extensive treatments. - Expresses wishes for natural death with no life-sustaining measures when her time comes  Chronic diarrhea and constipation Chronic diarrhea and constipation with alternating symptoms. Diarrhea persists throughout the week, and constipation occurs with medication use. Stress exacerbates symptoms, and there is associated back pain. - Monitor bowel movements and adjust medication as needed. - Consider dietary modifications such as bananas and cheese to manage symptoms. - Use Miralax  if constipation persists beyond two days.  Follow-up -See patient back in the clinic in 4-6 weeks.  Sooner if needed.  Patient expressed understanding and was in agreement with this plan. She also understands that She can call the clinic at any time with any questions, concerns, or complaints.   Any controlled substances utilized were prescribed in the context of palliative care. PDMP has been reviewed.   Visit consisted of counseling and education dealing with the complex and emotionally intense issues of symptom management and palliative care in the setting of serious and potentially life-threatening illness.  Levon Borer, AGPCNP-BC   Palliative Medicine Team/[facility]

## 2024-04-06 ENCOUNTER — Encounter: Payer: Self-pay | Admitting: Nurse Practitioner

## 2024-04-06 ENCOUNTER — Encounter: Payer: Self-pay | Admitting: Hematology and Oncology

## 2024-04-09 DIAGNOSIS — I5020 Unspecified systolic (congestive) heart failure: Secondary | ICD-10-CM | POA: Diagnosis not present

## 2024-04-09 DIAGNOSIS — M25569 Pain in unspecified knee: Secondary | ICD-10-CM | POA: Diagnosis not present

## 2024-04-09 DIAGNOSIS — R062 Wheezing: Secondary | ICD-10-CM | POA: Diagnosis not present

## 2024-04-10 DIAGNOSIS — M25569 Pain in unspecified knee: Secondary | ICD-10-CM | POA: Diagnosis not present

## 2024-04-10 DIAGNOSIS — R062 Wheezing: Secondary | ICD-10-CM | POA: Diagnosis not present

## 2024-04-10 DIAGNOSIS — I5020 Unspecified systolic (congestive) heart failure: Secondary | ICD-10-CM | POA: Diagnosis not present

## 2024-04-11 ENCOUNTER — Telehealth: Payer: Self-pay | Admitting: Hematology and Oncology

## 2024-04-11 ENCOUNTER — Encounter: Payer: Self-pay | Admitting: Family Medicine

## 2024-04-11 ENCOUNTER — Ambulatory Visit (INDEPENDENT_AMBULATORY_CARE_PROVIDER_SITE_OTHER): Admitting: Family Medicine

## 2024-04-11 ENCOUNTER — Other Ambulatory Visit: Payer: Self-pay

## 2024-04-11 VITALS — BP 147/68 | HR 69

## 2024-04-11 DIAGNOSIS — E1149 Type 2 diabetes mellitus with other diabetic neurological complication: Secondary | ICD-10-CM

## 2024-04-11 DIAGNOSIS — C50919 Malignant neoplasm of unspecified site of unspecified female breast: Secondary | ICD-10-CM

## 2024-04-11 DIAGNOSIS — I10 Essential (primary) hypertension: Secondary | ICD-10-CM

## 2024-04-11 DIAGNOSIS — F419 Anxiety disorder, unspecified: Secondary | ICD-10-CM

## 2024-04-11 LAB — POCT GLYCOSYLATED HEMOGLOBIN (HGB A1C): HbA1c, POC (controlled diabetic range): 6.6 % (ref 0.0–7.0)

## 2024-04-11 MED ORDER — LANTUS SOLOSTAR 100 UNIT/ML ~~LOC~~ SOPN: 20.0000 [IU] | PEN_INJECTOR | Freq: Every day | SUBCUTANEOUS | 6 refills | Status: AC

## 2024-04-11 MED ORDER — FREESTYLE LIBRE 2 PLUS SENSOR MISC: 12 refills | Status: DC

## 2024-04-11 MED ORDER — DIAZEPAM 5 MG PO TABS: 5.0000 mg | ORAL_TABLET | Freq: Two times a day (BID) | ORAL | 0 refills | Status: DC | PRN

## 2024-04-11 MED ORDER — ONETOUCH ULTRA VI STRP: ORAL_STRIP | 12 refills | Status: DC

## 2024-04-11 NOTE — Assessment & Plan Note (Signed)
 Reasonable as I do not want diastolic to drop any lower, CMP at cancer center with normal GFR

## 2024-04-11 NOTE — Assessment & Plan Note (Signed)
 A1c 6.6, discussed she is at max dose of Ozempic  Will message [facility] to see if Mounjaro could be covered by insurance, if so could consider switching and eventually be able to decrease insulin 

## 2024-04-11 NOTE — Progress Notes (Signed)
    SUBJECTIVE:   CHIEF COMPLAINT / HPI:   T2DM- comes in yearly for an A1c, needs new libre monitor as well as test strips. Taking lantus  20 units daily and Humalog  10 units TIDWM. Lowest BG in the past year was 95, usually runnings 180s, sometimes up to 240s. Interested in going up on Ozempic . Does have some nausea with it but is tolerable. Needs refill on libre 2 PLUS sensors as well as glucose test strips for her glucometer.  HTN- doesn't check at home. Not on any medication  Metastatic breast cancer- on letrozole , as well as once month chemo infusion. Follows with oncology. Has loperamide  and Zofran  for symptom control.   PERTINENT  PMH / PSH: breast cancer with metastases to the lung, HTN, T2DM, lumbar spinal stenosis  OBJECTIVE:   BP (!) 147/68   Pulse 69   SpO2 97%   Physical Exam General: A&O, NAD HEENT: No sign of trauma, EOM grossly intact Cardiac: RRR, no m/r/g Respiratory: CTAB, normal WOB, no w/c/r, , on 3L O2 Extremities: NTTP, no peripheral edema. Neuro: in wheelchair Psych: Appropriate mood and affect    ASSESSMENT/PLAN:   Assessment & Plan Diabetes mellitus type 2 with neurological manifestations (HCC) A1c 6.6, discussed she is at max dose of Ozempic  Will message [facility] to see if Pat could be covered by insurance, if so could consider switching and eventually be able to decrease insulin  HYPERTENSION, BENIGN SYSTEMIC Reasonable as I do not want diastolic to drop any lower, CMP at cancer center with normal GFR  Assessment & Plan    Rollene FORBES Keeling, MD [facility]

## 2024-04-11 NOTE — Telephone Encounter (Signed)
 I attempted to re-schedule [name]. Roderick stated that she can only come into the office on Monday's due to her daughter's schedule.

## 2024-04-11 NOTE — Patient Instructions (Addendum)
 It was wonderful to see you today.  Please bring ALL of your medications with you to every visit.   Today we talked about:  - We will refill your libre 2 sensors and one touch test strips, your A1c was 6.6 today  Thank you for choosing [facility].   Please call [phone] with any questions about today's appointment.  Please arrive at least 15 minutes prior to your scheduled appointments.   If you had blood work today, I will send you a MyChart message or a letter if results are normal. Otherwise, I will give you a call.   If you had a referral placed, they will call you to set up an appointment. Please give us  a call if you don't hear back in the next 2 weeks.   If you need additional refills before your next appointment, please call your pharmacy first.  Don't forget to check out the [facility] in [facility] at [address] [phone] Affordable prices on prescriptions and over-the-counter items, as well as services like vaccinations and medication home delivery.   Rollene Keeling, MD  Family Medicine

## 2024-04-12 ENCOUNTER — Telehealth: Payer: Self-pay | Admitting: Hematology and Oncology

## 2024-04-12 ENCOUNTER — Other Ambulatory Visit (HOSPITAL_COMMUNITY): Payer: Self-pay

## 2024-04-12 NOTE — Telephone Encounter (Signed)
 I attempted to inform [name] that her appointments are staying scheduled as is for 9/15 as Dr. Loretha has agreed to see her during her Admin time.

## 2024-04-13 ENCOUNTER — Other Ambulatory Visit: Payer: Self-pay | Admitting: Nurse Practitioner

## 2024-04-13 DIAGNOSIS — R110 Nausea: Secondary | ICD-10-CM

## 2024-04-13 DIAGNOSIS — C50919 Malignant neoplasm of unspecified site of unspecified female breast: Secondary | ICD-10-CM

## 2024-04-13 DIAGNOSIS — Z515 Encounter for palliative care: Secondary | ICD-10-CM

## 2024-04-14 ENCOUNTER — Other Ambulatory Visit (HOSPITAL_COMMUNITY): Payer: Self-pay

## 2024-04-15 ENCOUNTER — Telehealth: Payer: Self-pay | Admitting: Family Medicine

## 2024-04-15 DIAGNOSIS — E1149 Type 2 diabetes mellitus with other diabetic neurological complication: Secondary | ICD-10-CM

## 2024-04-15 MED ORDER — SEMAGLUTIDE (1 MG/DOSE) 4 MG/3ML ~~LOC~~ SOPN
1.0000 mg | PEN_INJECTOR | SUBCUTANEOUS | 0 refills | Status: DC
Start: 2024-04-15 — End: 2024-05-19

## 2024-04-15 NOTE — Telephone Encounter (Signed)
 Called pt and discussed $0 co pay with Mounjaro. However, also discussed GI side effects similar to ozempic  with nausea,diarrhea, GI symptoms. She notes she has these symptoms already on her 2 mg Ozempic  weekly and thought it was just due to her chemo and didn't realize it could also be her Ozempic . She has them all month, not just during the week after her chemo. We agreed to go down to the 1 mg Ozempic  weekly from her current 2mg  weekly to see if symptoms improve. She will let us  know in a few weeks if her symptoms are improved and which dose she would like tocontinue.  All questions answered.  Rollene Keeling MD

## 2024-04-26 DIAGNOSIS — E1149 Type 2 diabetes mellitus with other diabetic neurological complication: Secondary | ICD-10-CM | POA: Diagnosis not present

## 2024-04-28 ENCOUNTER — Other Ambulatory Visit: Payer: Self-pay | Admitting: *Deleted

## 2024-04-28 DIAGNOSIS — C50919 Malignant neoplasm of unspecified site of unspecified female breast: Secondary | ICD-10-CM

## 2024-05-02 ENCOUNTER — Inpatient Hospital Stay (HOSPITAL_BASED_OUTPATIENT_CLINIC_OR_DEPARTMENT_OTHER): Attending: Oncology | Admitting: Hematology and Oncology

## 2024-05-02 ENCOUNTER — Inpatient Hospital Stay: Attending: Oncology

## 2024-05-02 ENCOUNTER — Inpatient Hospital Stay

## 2024-05-02 ENCOUNTER — Encounter: Payer: Self-pay | Admitting: Hematology and Oncology

## 2024-05-02 VITALS — BP 157/64 | HR 77 | Temp 98.1°F | Resp 18 | Wt 296.0 lb

## 2024-05-02 DIAGNOSIS — Z87891 Personal history of nicotine dependence: Secondary | ICD-10-CM | POA: Insufficient documentation

## 2024-05-02 DIAGNOSIS — C7800 Secondary malignant neoplasm of unspecified lung: Secondary | ICD-10-CM | POA: Diagnosis not present

## 2024-05-02 DIAGNOSIS — Z79811 Long term (current) use of aromatase inhibitors: Secondary | ICD-10-CM | POA: Diagnosis not present

## 2024-05-02 DIAGNOSIS — Z170 Estrogen receptor positive status [ER+]: Secondary | ICD-10-CM | POA: Insufficient documentation

## 2024-05-02 DIAGNOSIS — C50212 Malignant neoplasm of upper-inner quadrant of left female breast: Secondary | ICD-10-CM | POA: Insufficient documentation

## 2024-05-02 DIAGNOSIS — C50919 Malignant neoplasm of unspecified site of unspecified female breast: Secondary | ICD-10-CM

## 2024-05-02 DIAGNOSIS — Z5112 Encounter for antineoplastic immunotherapy: Secondary | ICD-10-CM | POA: Diagnosis not present

## 2024-05-02 LAB — CMP (CANCER CENTER ONLY)
ALT: 61 U/L — ABNORMAL HIGH (ref 0–44)
AST: 56 U/L — ABNORMAL HIGH (ref 15–41)
Albumin: 3.7 g/dL (ref 3.5–5.0)
Alkaline Phosphatase: 86 U/L (ref 38–126)
Anion gap: 6 (ref 5–15)
BUN: 15 mg/dL (ref 8–23)
CO2: 31 mmol/L (ref 22–32)
Calcium: 9.4 mg/dL (ref 8.9–10.3)
Chloride: 103 mmol/L (ref 98–111)
Creatinine: 0.72 mg/dL (ref 0.44–1.00)
GFR, Estimated: >60 mL/min (ref >60)
Glucose, Bld: 182 mg/dL — ABNORMAL HIGH (ref 70–99)
Potassium: 4.2 mmol/L (ref 3.5–5.1)
Sodium: 140 mmol/L (ref 135–145)
Total Bilirubin: 0.5 mg/dL (ref 0.0–1.2)
Total Protein: 7.5 g/dL (ref 6.5–8.1)

## 2024-05-02 LAB — CBC WITH DIFFERENTIAL (CANCER CENTER ONLY)
Abs Immature Granulocytes: 0.02 K/uL (ref 0.00–0.07)
Basophils Absolute: 0 K/uL (ref 0.0–0.1)
Basophils Relative: 1 %
Eosinophils Absolute: 0.1 K/uL (ref 0.0–0.5)
Eosinophils Relative: 2 %
HCT: 42 % (ref 36.0–46.0)
Hemoglobin: 14.2 g/dL (ref 12.0–15.0)
Immature Granulocytes: 0 %
Lymphocytes Relative: 28 %
Lymphs Abs: 1.8 K/uL (ref 0.7–4.0)
MCH: 28.9 pg (ref 26.0–34.0)
MCHC: 33.8 g/dL (ref 30.0–36.0)
MCV: 85.5 fL (ref 80.0–100.0)
Monocytes Absolute: 0.5 K/uL (ref 0.1–1.0)
Monocytes Relative: 8 %
Neutro Abs: 4 K/uL (ref 1.7–7.7)
Neutrophils Relative %: 61 %
Platelet Count: 203 K/uL (ref 150–400)
RBC: 4.91 MIL/uL (ref 3.87–5.11)
RDW: 13.9 % (ref 11.5–15.5)
WBC Count: 6.5 K/uL (ref 4.0–10.5)
nRBC: 0 % (ref 0.0–0.2)

## 2024-05-02 MED ORDER — TRASTUZUMAB-ANNS CHEMO 150 MG IV SOLR
750.0000 mg | Freq: Once | INTRAVENOUS | Status: AC
  Administered 2024-05-02: 750 mg via INTRAVENOUS
  Filled 2024-05-02: qty 35.71

## 2024-05-02 MED ORDER — DIPHENHYDRAMINE HCL 25 MG PO CAPS
50.0000 mg | ORAL_CAPSULE | Freq: Once | ORAL | Status: AC
  Administered 2024-05-02: 50 mg via ORAL
  Filled 2024-05-02: qty 2

## 2024-05-02 MED ORDER — LORAZEPAM 2 MG/ML IJ SOLN: 0.5000 mg | Freq: Once | INTRAMUSCULAR | Status: DC

## 2024-05-02 MED ORDER — ACETAMINOPHEN 325 MG PO TABS
650.0000 mg | ORAL_TABLET | Freq: Once | ORAL | Status: AC
  Administered 2024-05-02: 650 mg via ORAL
  Filled 2024-05-02: qty 2

## 2024-05-02 MED ORDER — SODIUM CHLORIDE 0.9 % IV SOLN: Freq: Once | INTRAVENOUS | Status: AC

## 2024-05-02 NOTE — Progress Notes (Signed)
 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 [facility]) Staging form: Breast, AJCC 7th Edition - Clinical: Stage IV (TX, NX, M1) - Signed by Layla Sandria BROCKS, MD on 04/17/2015  Malignant neoplasm of upper-inner quadrant of left breast in female, estrogen receptor positive (HCC) Staging form: Breast, AJCC 7th Edition - Pathologic: Stage IV (M1) - Signed by Loretha Ash, MD on 11/17/2023 Specimen type: Core Needle Biopsy Histopathologic type: 9931 Laterality: Left   SUMMARY OF ONCOLOGIC HISTORY: 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 (HCC)  12/14/2012 Initial Diagnosis   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 Magrinat     05/23/2013 Initial Diagnosis   Malignant neoplasm of upper-inner quadrant of left breast in female, estrogen receptor positive (HCC)     CURRENT THERAPY: herceptin /Letrozole   INTERVAL HISTORY:  [name] 73 y.o. female returns for f/u of her breast cancer on treatment with Letrozole  and herceptin   Discussed the use of AI scribe software for clinical note transcription with the patient, who gave verbal consent to proceed.  History of Present Illness  [name] is a 73 year old female who presents for routine follow-up.  She is currently on letrozole  and receives monthly  Herceptin  as part of her cancer treatment. She experiences persistent diarrhea and stomach issues, which she manages by monitoring her symptoms. She feels tired but is coping with her condition. No new cough, chest pain, shortness of breath, or changes in bowel habits.  She expresses concern about undergoing scans due to severe pain experienced during a previous hospital stay, which led to passing out from pain and elevated blood pressure. She reports that she is not currently worried about her cancer status and feels reassured. She says she is at peace if the cancer were to progress.  Socially, she is involved in the care of her grandson, whom she has raised since he was seven. She supports her daughter's educational pursuits in the medical field. Her grandson has faced challenges in school, particularly during the COVID pandemic, and she is actively seeking better educational opportunities for him.  Rest of the pertinent 10 point ROS reviewed and neg.  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  MEDICAL HISTORY:  Past Medical History:  Diagnosis Date   Arthritis    Back pain    Breast cancer (HCC) dx'd 11/2012   left   Chest pain    COPD (chronic obstructive pulmonary disease) (HCC)    Diabetes mellitus without complication (HCC) 09/28/2014   Ear pain    Fatty liver 6/03    Hypertension    Lung disease    Lung metastases dx'd 11/2012   Obesity, unspecified    Other abnormal glucose    Suicide attempt (HCC) 1996   Syncope and collapse    Unspecified sleep apnea     SURGICAL HISTORY: Past Surgical History:  Procedure Laterality Date   CARDIAC CATHETERIZATION     2007   CHOLECYSTECTOMY     TUBAL LIGATION      SOCIAL HISTORY: Social History   Socioeconomic History   Marital status: Divorced    Spouse name: Not on file   Number of children: 4   Years of education: 12   Highest education level: 12th grade  Occupational History   Occupation: Disability   Occupation: retired-daycare  Tobacco Use   Smoking status: Former    Current packs/day: 0.00    Average packs/day: 3.0 packs/day for 5.0 years (15.0 ttl pk-yrs)    Types: Cigarettes    Start date: 08/19/1963    Quit date: 08/18/1968    Years since quitting: 55.7    Passive exposure: Past   Smokeless tobacco: Never  Vaping Use   Vaping status: Never Used  Substance and Sexual Activity   Alcohol use: No    Alcohol/week: 0.0 standard drinks of alcohol   Drug use: Yes    Types: Marijuana    Comment: Medical Marijuana   Sexual activity: Not Currently    Partners: Male  Other Topics Concern   Not on file  Social History Narrative   Health Care POA:    Emergency Contact: Alyse Ruth [phone]   End of Life Plan:    Who lives with you: two grandchildren, friend, daughter   Any pets: 3 poodles   Diet: Pt has a variety of protein, starch, vegetables.  Pt is currently working on cutting back on portions for weight loss.   Exercise: Pt has not regular exercise routine.  Occasionally walks around home.   Seatbelts: Pt reports wearing seatbelt occasionally.   Sun Exposure/Protection: Pt does not use sun protection   Hobbies: reading, playing on kindle, ebay         Currently in her home she keeps her granddaughter Chuck Pounds, 16, who is the daughter of the patient's daughter Rilla (the  patient refers to [name] as my adopted daughter); grandson Cherese Lozano, [place], who is [name]'s half-brother; daughter Butler, and an [place] friend, Bonner Ruth Forbes, the patient's significant other. Daughter Butler is a Public librarian, currently unemployed. Son Charlie Dillon Junior works as an Personnel officer in [place]. Daughter Rilla is currently in prison due to killing someone in a car accident. Daughter [name] died from aplastic anemia at the age of 93. The patient has a total of 4 grandchildren. She is not a church attender   Social Drivers of Health   Financial Resource Strain: High Risk (05/02/2023)   Overall Financial Resource Strain (CARDIA)    Difficulty of Paying Living Expenses: Very hard  Food Insecurity: Food Insecurity Present (11/17/2023)   Hunger Vital Sign    Worried About Running Out of Food in the Last Year: Often true    Ran Out of Food  in the Last Year: Sometimes true  Transportation Needs: Unmet Transportation Needs (05/02/2023)   PRAPARE - Transportation    Lack of Transportation (Medical): Yes    Lack of Transportation (Non-Medical): Yes  Physical Activity: Unknown (05/02/2023)   Exercise Vital Sign    Days of Exercise per Week: Patient declined    Minutes of Exercise per Session: Not on file  Stress: Stress Concern Present (05/02/2023)   [employer] of Occupational Health - Occupational Stress Questionnaire    Feeling of Stress : To some extent  Social Connections: Socially Isolated (05/02/2023)   Social Connection and Isolation Panel    Frequency of Communication with Friends and Family: Twice a week    Frequency of Social Gatherings with Friends and Family: Never    Attends Religious Services: Never    Database administrator or Organizations: No    Attends Engineer, structural: Not on file    Marital Status: Widowed  Intimate Partner Violence: Not At Risk (07/30/2022)   Humiliation, Afraid, Rape, and Kick questionnaire     Fear of Current or Ex-Partner: No    Emotionally Abused: No    Physically Abused: No    Sexually Abused: No    FAMILY HISTORY: Family History  Problem Relation Age of Onset   Coronary artery disease Father 44   Diabetes Father    Heart disease Father    Breast cancer Mother 74   Cancer Mother 74       breast   Aplastic anemia Daughter        died at age 22   Cancer Maternal Aunt 66       ovarian   Cancer Maternal Grandmother 26       ovarian   Cancer Paternal Aunt 62       ovarian/breast/breast   Coronary artery disease Sister 58   Coronary artery disease Brother 33    PHYSICAL EXAMINATION   Onc Performance Status - 05/02/24 1300       KPS SCALE   KPS % SCORE Cares for self, unable to carry on normal activity or to do active work          Vitals:   05/02/24 1325  BP: (!) 157/64  Pulse: 77  Resp: 18  Temp: 98.1 F (36.7 C)  SpO2: 92%     Physical Exam Constitutional:      General: She is not in acute distress.    Appearance: Normal appearance. She is not toxic-appearing.     Comments: Examined in wheelchair  HENT:     Head: Normocephalic and atraumatic.     Mouth/Throat:     Mouth: Mucous membranes are moist.     Pharynx: Oropharynx is clear. No oropharyngeal exudate or posterior oropharyngeal erythema.  Eyes:     General: No scleral icterus. Cardiovascular:     Rate and Rhythm: Normal rate and regular rhythm.     Pulses: Normal pulses.     Heart sounds: Normal heart sounds.  Pulmonary:     Effort: Pulmonary effort is normal.     Breath sounds: Normal breath sounds.  Abdominal:     General: Abdomen is flat.     Palpations: Abdomen is soft.     Tenderness: There is no abdominal tenderness.     Comments: Limited exam due to body habitus and exam in wheelchair  Musculoskeletal:        General: Swelling (Bilateral LE swelling symmetric) present.     Cervical back: Neck  supple.  Lymphadenopathy:     Cervical: No cervical adenopathy.  Skin:     General: Skin is warm and dry.     Findings: No rash.  Neurological:     General: No focal deficit present.     Mental Status: She is alert.  Psychiatric:        Mood and Affect: Mood normal.        Behavior: Behavior normal.       ASSESSMENT and THERAPY PLAN:   Assessment and Plan Assessment & Plan Metastatic breast cancer Managed with letrozole  and monthly Herceptin . Blood counts stable, cardiac function maintained. She declined scans due to previous pain . Her last scan was almost a yr ago. She understands that we are sub optimally treating her if she is indeed progressing. She says if the cancer were to progress, she is at peace with this. - Continue letrozole  therapy. - Administer Herceptin  monthly. - Monitor blood counts regularly. - Reassess in three months unless symptoms necessitate earlier evaluation.  All questions were answered. The patient knows to call the clinic with any problems, questions or concerns. We can certainly see the patient much sooner if necessary.  Total encounter time:30 minutes*in face-to-face visit time, chart review, lab review, care coordination, order entry, and documentation of the encounter time.  *Total Encounter Time as defined by the Centers for Medicare and Medicaid Services includes, in addition to the face-to-face time of a patient visit (documented in the note above) non-face-to-face time: obtaining and reviewing outside history, ordering and reviewing medications, tests or procedures, care coordination (communications with other health care professionals or caregivers) and documentation in the medical record.

## 2024-05-02 NOTE — Patient Instructions (Signed)

## 2024-05-10 DIAGNOSIS — R062 Wheezing: Secondary | ICD-10-CM | POA: Diagnosis not present

## 2024-05-10 DIAGNOSIS — I5020 Unspecified systolic (congestive) heart failure: Secondary | ICD-10-CM | POA: Diagnosis not present

## 2024-05-10 DIAGNOSIS — M25569 Pain in unspecified knee: Secondary | ICD-10-CM | POA: Diagnosis not present

## 2024-05-11 DIAGNOSIS — M25569 Pain in unspecified knee: Secondary | ICD-10-CM | POA: Diagnosis not present

## 2024-05-11 DIAGNOSIS — I5020 Unspecified systolic (congestive) heart failure: Secondary | ICD-10-CM | POA: Diagnosis not present

## 2024-05-11 DIAGNOSIS — R062 Wheezing: Secondary | ICD-10-CM | POA: Diagnosis not present

## 2024-05-16 ENCOUNTER — Ambulatory Visit: Admitting: Hematology and Oncology

## 2024-05-19 ENCOUNTER — Other Ambulatory Visit: Payer: Self-pay

## 2024-05-19 ENCOUNTER — Other Ambulatory Visit (HOSPITAL_BASED_OUTPATIENT_CLINIC_OR_DEPARTMENT_OTHER): Payer: Self-pay

## 2024-05-19 ENCOUNTER — Other Ambulatory Visit: Payer: Self-pay | Admitting: Family Medicine

## 2024-05-19 ENCOUNTER — Inpatient Hospital Stay

## 2024-05-19 ENCOUNTER — Other Ambulatory Visit (HOSPITAL_COMMUNITY): Payer: Self-pay

## 2024-05-19 DIAGNOSIS — E1149 Type 2 diabetes mellitus with other diabetic neurological complication: Secondary | ICD-10-CM

## 2024-05-19 MED ORDER — OZEMPIC (2 MG/DOSE) 8 MG/3ML ~~LOC~~ SOPN
2.0000 mg | PEN_INJECTOR | SUBCUTANEOUS | 3 refills | Status: DC
  Filled 2024-05-19: qty 3, 28d supply, fill #0
  Filled 2024-06-15: qty 3, 28d supply, fill #1

## 2024-05-23 ENCOUNTER — Other Ambulatory Visit: Payer: Self-pay

## 2024-05-23 DIAGNOSIS — F419 Anxiety disorder, unspecified: Secondary | ICD-10-CM

## 2024-05-23 DIAGNOSIS — C50919 Malignant neoplasm of unspecified site of unspecified female breast: Secondary | ICD-10-CM

## 2024-05-23 MED ORDER — DIAZEPAM 5 MG PO TABS: 5.0000 mg | ORAL_TABLET | Freq: Two times a day (BID) | ORAL | 0 refills | Status: DC | PRN

## 2024-05-26 DIAGNOSIS — E1149 Type 2 diabetes mellitus with other diabetic neurological complication: Secondary | ICD-10-CM | POA: Diagnosis not present

## 2024-05-30 ENCOUNTER — Inpatient Hospital Stay: Attending: Oncology

## 2024-05-30 ENCOUNTER — Inpatient Hospital Stay

## 2024-05-30 ENCOUNTER — Encounter: Payer: Self-pay | Admitting: Nurse Practitioner

## 2024-05-30 ENCOUNTER — Inpatient Hospital Stay: Admitting: Nurse Practitioner

## 2024-05-30 ENCOUNTER — Encounter: Payer: Self-pay | Admitting: Hematology and Oncology

## 2024-05-30 ENCOUNTER — Encounter: Payer: Self-pay | Admitting: Adult Health

## 2024-05-30 ENCOUNTER — Inpatient Hospital Stay (HOSPITAL_BASED_OUTPATIENT_CLINIC_OR_DEPARTMENT_OTHER): Admitting: Adult Health

## 2024-05-30 VITALS — BP 155/93 | HR 80 | Temp 97.9°F | Resp 18 | Wt 294.9 lb

## 2024-05-30 DIAGNOSIS — R0789 Other chest pain: Secondary | ICD-10-CM | POA: Diagnosis not present

## 2024-05-30 DIAGNOSIS — C7800 Secondary malignant neoplasm of unspecified lung: Secondary | ICD-10-CM | POA: Insufficient documentation

## 2024-05-30 DIAGNOSIS — Z515 Encounter for palliative care: Secondary | ICD-10-CM | POA: Diagnosis not present

## 2024-05-30 DIAGNOSIS — R197 Diarrhea, unspecified: Secondary | ICD-10-CM | POA: Insufficient documentation

## 2024-05-30 DIAGNOSIS — Z803 Family history of malignant neoplasm of breast: Secondary | ICD-10-CM | POA: Insufficient documentation

## 2024-05-30 DIAGNOSIS — Z170 Estrogen receptor positive status [ER+]: Secondary | ICD-10-CM | POA: Diagnosis not present

## 2024-05-30 DIAGNOSIS — G4709 Other insomnia: Secondary | ICD-10-CM | POA: Diagnosis not present

## 2024-05-30 DIAGNOSIS — G4733 Obstructive sleep apnea (adult) (pediatric): Secondary | ICD-10-CM | POA: Insufficient documentation

## 2024-05-30 DIAGNOSIS — G893 Neoplasm related pain (acute) (chronic): Secondary | ICD-10-CM

## 2024-05-30 DIAGNOSIS — C50919 Malignant neoplasm of unspecified site of unspecified female breast: Secondary | ICD-10-CM

## 2024-05-30 DIAGNOSIS — G8929 Other chronic pain: Secondary | ICD-10-CM | POA: Insufficient documentation

## 2024-05-30 DIAGNOSIS — Z8041 Family history of malignant neoplasm of ovary: Secondary | ICD-10-CM | POA: Diagnosis not present

## 2024-05-30 DIAGNOSIS — C50212 Malignant neoplasm of upper-inner quadrant of left female breast: Secondary | ICD-10-CM | POA: Diagnosis not present

## 2024-05-30 DIAGNOSIS — R530 Neoplastic (malignant) related fatigue: Secondary | ICD-10-CM | POA: Diagnosis not present

## 2024-05-30 DIAGNOSIS — K5909 Other constipation: Secondary | ICD-10-CM | POA: Diagnosis not present

## 2024-05-30 DIAGNOSIS — Z5112 Encounter for antineoplastic immunotherapy: Secondary | ICD-10-CM | POA: Insufficient documentation

## 2024-05-30 DIAGNOSIS — J449 Chronic obstructive pulmonary disease, unspecified: Secondary | ICD-10-CM | POA: Insufficient documentation

## 2024-05-30 DIAGNOSIS — F5104 Psychophysiologic insomnia: Secondary | ICD-10-CM | POA: Insufficient documentation

## 2024-05-30 DIAGNOSIS — Z87891 Personal history of nicotine dependence: Secondary | ICD-10-CM | POA: Insufficient documentation

## 2024-05-30 DIAGNOSIS — Z79811 Long term (current) use of aromatase inhibitors: Secondary | ICD-10-CM | POA: Diagnosis not present

## 2024-05-30 LAB — CMP (CANCER CENTER ONLY)
ALT: 41 U/L (ref 0–44)
AST: 27 U/L (ref 15–41)
Albumin: 3.6 g/dL (ref 3.5–5.0)
Alkaline Phosphatase: 86 U/L (ref 38–126)
Anion gap: 6 (ref 5–15)
BUN: 15 mg/dL (ref 8–23)
CO2: 33 mmol/L — ABNORMAL HIGH (ref 22–32)
Calcium: 9.8 mg/dL (ref 8.9–10.3)
Chloride: 101 mmol/L (ref 98–111)
Creatinine: 0.68 mg/dL (ref 0.44–1.00)
GFR, Estimated: >60 mL/min (ref >60)
Glucose, Bld: 188 mg/dL — ABNORMAL HIGH (ref 70–99)
Potassium: 4.2 mmol/L (ref 3.5–5.1)
Sodium: 140 mmol/L (ref 135–145)
Total Bilirubin: 0.5 mg/dL (ref 0.0–1.2)
Total Protein: 7.3 g/dL (ref 6.5–8.1)

## 2024-05-30 LAB — CBC WITH DIFFERENTIAL (CANCER CENTER ONLY)
Abs Immature Granulocytes: 0.02 K/uL (ref 0.00–0.07)
Basophils Absolute: 0 K/uL (ref 0.0–0.1)
Basophils Relative: 1 %
Eosinophils Absolute: 0.1 K/uL (ref 0.0–0.5)
Eosinophils Relative: 1 %
HCT: 41.1 % (ref 36.0–46.0)
Hemoglobin: 13.7 g/dL (ref 12.0–15.0)
Immature Granulocytes: 0 %
Lymphocytes Relative: 25 %
Lymphs Abs: 1.5 K/uL (ref 0.7–4.0)
MCH: 28.8 pg (ref 26.0–34.0)
MCHC: 33.3 g/dL (ref 30.0–36.0)
MCV: 86.3 fL (ref 80.0–100.0)
Monocytes Absolute: 0.5 K/uL (ref 0.1–1.0)
Monocytes Relative: 9 %
Neutro Abs: 3.9 K/uL (ref 1.7–7.7)
Neutrophils Relative %: 64 %
Platelet Count: 240 K/uL (ref 150–400)
RBC: 4.76 MIL/uL (ref 3.87–5.11)
RDW: 14 % (ref 11.5–15.5)
WBC Count: 6.1 K/uL (ref 4.0–10.5)
nRBC: 0 % (ref 0.0–0.2)

## 2024-05-30 MED ORDER — TRASTUZUMAB-ANNS CHEMO 150 MG IV SOLR
750.0000 mg | Freq: Once | INTRAVENOUS | Status: AC
  Administered 2024-05-30: 750 mg via INTRAVENOUS
  Filled 2024-05-30: qty 35.71

## 2024-05-30 MED ORDER — ACETAMINOPHEN 325 MG PO TABS
650.0000 mg | ORAL_TABLET | Freq: Once | ORAL | Status: AC
  Administered 2024-05-30: 650 mg via ORAL
  Filled 2024-05-30: qty 2

## 2024-05-30 MED ORDER — LORAZEPAM 2 MG/ML IJ SOLN
0.5000 mg | Freq: Once | INTRAMUSCULAR | Status: AC
  Administered 2024-05-30: 0.5 mg via INTRAVENOUS
  Filled 2024-05-30: qty 1

## 2024-05-30 MED ORDER — SODIUM CHLORIDE 0.9 % IV SOLN: Freq: Once | INTRAVENOUS | Status: AC

## 2024-05-30 MED ORDER — DIPHENHYDRAMINE HCL 25 MG PO CAPS
50.0000 mg | ORAL_CAPSULE | Freq: Once | ORAL | Status: AC
  Administered 2024-05-30: 50 mg via ORAL
  Filled 2024-05-30: qty 2

## 2024-05-30 NOTE — Progress Notes (Signed)
 Palliative Medicine [facility]  [phone] [phone]   Name: [name] Date: 05/30/2024 MRN: [id number]  DOB: 1951/06/10  Patient Care Team: [name], MD as PCP - General (Family Medicine) Rolan Ezra RAMAN, MD as Consulting Physician (Cardiology) Loretha Ash, MD as Consulting Physician (Hematology and Oncology)    INTERVAL HISTORY: [name] is a 73 y.o. female with medical history including metastatic breast cancer with numerous pulmonary nodules bilaterally s/p chemotherapy currently on herceptin  for maintenance. Now with complaints of uncontrolled back pain.  Palliative ask to see for symptom management.    SOCIAL HISTORY:     reports that she quit smoking about 55 years ago. Her smoking use included cigarettes. She started smoking about 60 years ago. She has a 15 pack-year smoking history. She has been exposed to tobacco smoke. She has never used smokeless tobacco. She reports current drug use. Drug: Marijuana. She reports that she does not drink alcohol.  ADVANCE DIRECTIVES:  None on file  CODE STATUS: Full code  PAST MEDICAL HISTORY: Past Medical History:  Diagnosis Date   Arthritis    Back pain    Breast cancer (HCC) dx'd 11/2012   left   Chest pain    COPD (chronic obstructive pulmonary disease) (HCC)    Diabetes mellitus without complication (HCC) 09/28/2014   Ear pain    Fatty liver 6/03   Hypertension    Lung disease    Lung metastases dx'd 11/2012   Obesity, unspecified    Other abnormal glucose    Suicide attempt (HCC) 1996   Syncope and collapse    Unspecified sleep apnea     ALLERGIES:  is allergic to meperidine hcl, penicillins, and aspirin .  MEDICATIONS:  Current Outpatient Medications  Medication Sig Dispense Refill   albuterol  (VENTOLIN  HFA) 108 (90 Base) MCG/ACT inhaler INHALE 2 PUFFS INTO THE LUNGS EVERY 6 HOURS AS NEEDED FOR WHEEZE 8.5 each 1   B-D ULTRAFINE III SHORT PEN 31G X 8 MM  MISC PLEASE USE TO INJECT INSULIN  4 TIMES DAILY. 200 each 3   Blood Glucose Monitoring Suppl (ONE TOUCH ULTRA 2) w/Device KIT 1 kit by Does not apply route QID. ICD 10-code: E11.49. 1 kit 0   Continuous Glucose Sensor (FREESTYLE LIBRE 2 PLUS SENSOR) MISC Change sensor every 15 days. 1 each 12   diazepam  (VALIUM ) 5 MG tablet Take 1 tablet (5 mg total) by mouth every 12 (twelve) hours as needed for anxiety. 45 tablet 0   glucose blood (ONETOUCH ULTRA) test strip Use as instructed 100 strip 12   insulin  glargine (LANTUS  SOLOSTAR) 100 UNIT/ML Solostar Pen Inject 20 Units into the skin daily. 15 mL 6   insulin  lispro (HUMALOG ) 100 UNIT/ML KwikPen Inject 8-10 Units into the skin 3 (three) times daily. 3 mL 3   Insulin  Pen Needle (PEN NEEDLES) 30G X 8 MM MISC Please use to inject insulin  4 times daily. 200 each 3   letrozole  (FEMARA ) 2.5 MG tablet TAKE 1 TABLET BY MOUTH EVERY DAY 90 tablet 12   loperamide  (IMODIUM ) 2 MG capsule Take 1 capsule (2 mg total) by mouth as needed for diarrhea or loose stools. 30 capsule 6   ondansetron  (ZOFRAN ) 8 MG tablet TAKE 1 TABLET BY MOUTH EVERY 8 HOURS AS NEEDED 20 tablet 2   OXYGEN  Inhale into the lungs.     Semaglutide , 2 MG/DOSE, (OZEMPIC , 2 MG/DOSE,) 8 MG/3ML SOPN Inject 2 mg into the skin once a week.  3 mL 3   No current facility-administered medications for this visit.    VITAL SIGNS: There were no vitals taken for this visit. There were no vitals filed for this visit.   Estimated body mass index is 50.62 kg/m as calculated from the following:   Height as of 01/13/24: 5' 4 (1.626 m).   Weight as of an earlier encounter on 05/30/24: 294 lb 14.4 oz (133.8 kg).   PERFORMANCE STATUS (ECOG) : 1 - Symptomatic but completely ambulatory  Physical Exam General: NAD Cardiovascular: regular rate and rhythm Pulmonary: normal breathing pattern Extremities: no edema, no joint deformities Skin: no rashes Neurological: AAO x3  IMPRESSION: Discussed the use of  AI scribe software for clinical note transcription with the patient, who gave verbal consent to proceed.  History of Present Illness [name] is a 73 year old female with manage stated breast cancer who presents to clinic for symptom management follow-up.  No acute distress noted.  Patient continues to take things one day at a time.  Reports ongoing fatigue.  Denies concerns with nausea.   Appetite is fair.  Some days are better than others.   She has issues with alternating constipation and diarrhea. She experiences diarrhea for a week, then takes medication to stop it, which leads to constipation. If she does not have a bowel movement for two days, she uses Miralax . Both constipation and diarrhea exacerbate her back pain.Her diet includes bananas and peanut butter several times a week, and she avoids bread. She describes a routine of slicing bananas and eating them without bread.  She has been experiencing chest pain for the past two months, with worsening symptoms over the last month. The pain is localized to the side of her heart and does not radiate to her arm, jaw, or neck. She attributes the exacerbation to stress related to her grandson changing from public to private school, which she found stressful.  She has been using Valium , which she picked up on October 6th, but it does not help her sleep. She previously used Ativan  and is requesting to restart versus her valium . She also mentions diabetes as a contributing factor to her sleep issues. Education provided on use in addition to multiple notations of switching between ativan  and valium . Advised patient at this time we will not make any adjustments at this time.   No changes to current regimen at this time. We will continue to closely support and follow as needed.   Goals of care [name] she has concerns that her body may be starting to physically show decline and that she will no longer be able to tolerate significant treatments in the  future.  States she is questioning the need for further scans as she does not feel she will do anything differently based on the imaging results.  We discussed the purpose of echocardiogram and CT scans to assist the oncologist with directing further treatment paths.  [name] states she is not a candidate for any surgical procedures and I am sure that she will consider any additional options outside of her current regimen specifically stating she is not interested in pursuing radiation if ever cancer will still progress.  She empathetically approach discussions regarding her healthcare limitations and wishes.  Patient is clear and expressed wishes to continue to treat the treatable allow her every opportunity to continue to thrive as she has no desire to give up at this time.  She does however states in the event of further  health decline her desire would be for natural death with no life-sustaining measures.   I discussed the importance of continued conversation with family and their medical providers regarding overall plan of care and treatment options, ensuring decisions are within the context of the patients values and GOCs. Assessment & Plan Anxiety/insomnia Patient reports anxiety is controlled with the Valium  however she is still challenged with insomnia. Previous melatonin and Tylenol  PM trials ineffective or adverse. - Consider trial of Unisom. - Evaluate medication regimen for sleep improvement. -Valium  5 mg twice daily as needed.  Pain management issues Chronic pain with intermittent flares. Previous oxycodone . Has not refilled since February 2025. Gabapentin  effective at higher doses however due to patient reporting severe cramps in calves and thighs, which she attributes to gabapentin  she chose to self discontinue.  - Discontinue gabapentin . -Consider restarting opioids if pain worsens. -Advised against starting and restarting opioids and adjustments. Will continue to closely  monitor.  Cancer-related fatigue and weakness Chronic fatigue and weakness from prolonged cancer treatment, causing mobility issues and falls. Insurance limits home health access.   Goals of Care She prefers natural disease progression without aggressive interventions, avoiding unnecessary tests or treatments.  - Clear and expressed wishes to continue to treat the treatable allow her every opportunity to continue to thrive.  Patient's speech specifically if cancer was to progress she would not be interested in pursuing further radiation therapy or more extensive treatments. - Expresses wishes for natural death with no life-sustaining measures when her time comes  Chronic diarrhea and constipation Chronic diarrhea and constipation with alternating symptoms. Diarrhea persists throughout the week, and constipation occurs with medication use. Stress exacerbates symptoms, and there is associated back pain. - Monitor bowel movements and adjust medication as needed. - Consider dietary modifications such as bananas and cheese to manage symptoms. - Use Miralax  if constipation persists beyond two days.  Chest pain Intermittent chest pain for two months, worsening in the past month, likely stress-related. Pain localized to the side of the heart, not radiating to the arm, jaw, or neck. Potential cardiac effects from chemotherapy. - Schedule cardiologist appointment on Mondays only per patient.  Follow-up -See patient back in the clinic in 4-6 weeks.  Sooner if needed.  Patient expressed understanding and was in agreement with this plan. She also understands that She can call the clinic at any time with any questions, concerns, or complaints.   Any controlled substances utilized were prescribed in the context of palliative care. PDMP has been reviewed.   Visit consisted of counseling and education dealing with the complex and emotionally intense issues of symptom management and palliative care in the  setting of serious and potentially life-threatening illness.  Levon Borer, AGPCNP-BC  Palliative Medicine Team/[facility]

## 2024-05-30 NOTE — Progress Notes (Signed)
 [facility] Cancer Follow up:    [name], MD [address] 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 (HCC)  12/14/2012 Initial Diagnosis   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 Magrinat     05/23/2013 Initial Diagnosis   Malignant neoplasm of upper-inner quadrant of left breast in female, estrogen receptor positive (HCC)     CURRENT THERAPY: Herceptin  and letrozole   INTERVAL HISTORY:  Discussed the use of AI scribe software for clinical note transcription with the patient, who gave verbal consent to proceed.  History of Present Illness [name] is a 73 year old female with stage four breast cancer who presents for treatment follow-up.  She is undergoing treatment with Herceptin  and letrozole . She experiences significant fatigue.  She has localized left sided  chest discomfort that she notes occur when she doesn't get sleep.  She says these are brief and limited.  The discomfort does not radiate and is not related to indigestion. She undergoes regular echocardiograms every six months.  She wears oxygen  related to COPD, and has sleep apnea for which she does not use her prescribed CPAP.    She has significant sleep disturbances, sleeping only a couple of hours before waking up and staying awake throughout the night. She takes Valium  but finds it insufficient for relaxation. Due to her COPD and sleep apnea, she cannot take sleeping pills and no longer uses her CPAP machine. Ativan  helps her sleep well when administered in the clinic.     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  MEDICAL HISTORY: Past Medical History:  Diagnosis Date   Arthritis    Back pain    Breast cancer (HCC) dx'd 11/2012   left   Chest pain    COPD (chronic obstructive pulmonary disease) (HCC)    Diabetes  mellitus without complication (HCC) 09/28/2014   Ear pain    Fatty liver 6/03   Hypertension    Lung disease    Lung metastases dx'd 11/2012   Obesity, unspecified    Other abnormal glucose    Suicide attempt (HCC) 1996   Syncope and collapse    Unspecified sleep apnea     SURGICAL HISTORY: Past Surgical History:  Procedure Laterality Date   CARDIAC  CATHETERIZATION     2007   CHOLECYSTECTOMY     TUBAL LIGATION      SOCIAL HISTORY: Social History   Socioeconomic History   Marital status: Divorced    Spouse name: Not on file   Number of children: 4   Years of education: 12   Highest education level: 12th grade  Occupational History   Occupation: Disability   Occupation: retired-daycare  Tobacco Use   Smoking status: Former    Current packs/day: 0.00    Average packs/day: 3.0 packs/day for 5.0 years (15.0 ttl pk-yrs)    Types: Cigarettes    Start date: 08/19/1963    Quit date: 08/18/1968    Years since quitting: 55.8    Passive exposure: Past   Smokeless tobacco: Never  Vaping Use   Vaping status: Never Used  Substance and Sexual Activity   Alcohol use: No    Alcohol/week: 0.0 standard drinks of alcohol   Drug use: Yes    Types: Marijuana    Comment: Medical Marijuana   Sexual activity: Not Currently    Partners: Male  Other Topics Concern   Not on file  Social History Narrative   Health Care POA:    Emergency Contact: Alyse Ruth [phone]   End of Life Plan:    Who lives with you: two grandchildren, friend, daughter   Any pets: 3 poodles   Diet: Pt has a variety of protein, starch, vegetables.  Pt is currently working on cutting back on portions for weight loss.   Exercise: Pt has not regular exercise routine.  Occasionally walks around home.   Seatbelts: Pt reports wearing seatbelt occasionally.   Sun Exposure/Protection: Pt does not use sun protection   Hobbies: reading, playing on kindle, ebay         Currently in her home she keeps her granddaughter Chuck Pounds, 16, who is the daughter of the patient's daughter Rilla (the patient refers to [name] as my adopted daughter); grandson Khandi Kernes, [place], who is [name]'s half-brother; daughter Butler, and an [place] friend, Bonner Ruth Forbes, the patient's significant other. Daughter Butler is a Public librarian, currently unemployed. Son  Charlie Dillon Junior works as an Personnel officer in [place]. Daughter Rilla is currently in prison due to killing someone in a car accident. Daughter [name] died from aplastic anemia at the age of 35. The patient has a total of 4 grandchildren. She is not a church attender   Social Drivers of Health   Financial Resource Strain: High Risk (05/02/2023)   Overall Financial Resource Strain (CARDIA)    Difficulty of Paying Living Expenses: Very hard  Food Insecurity: Food Insecurity Present (11/17/2023)   Hunger Vital Sign    Worried About Running Out of Food in the Last Year: Often true    Ran Out of Food in the Last Year: Sometimes true  Transportation Needs: Unmet Transportation Needs (05/02/2023)   PRAPARE - Administrator, Civil Service (Medical): Yes    Lack of Transportation (Non-Medical): Yes  Physical Activity: Unknown (05/02/2023)   Exercise  Vital Sign    Days of Exercise per Week: Patient declined    Minutes of Exercise per Session: Not on file  Stress: Stress Concern Present (05/02/2023)   [employer] of Occupational Health - Occupational Stress Questionnaire    Feeling of Stress : To some extent  Social Connections: Socially Isolated (05/02/2023)   Social Connection and Isolation Panel    Frequency of Communication with Friends and Family: Twice a week    Frequency of Social Gatherings with Friends and Family: Never    Attends Religious Services: Never    Database administrator or Organizations: No    Attends Engineer, structural: Not on file    Marital Status: Widowed  Intimate Partner Violence: Not At Risk (07/30/2022)   Humiliation, Afraid, Rape, and Kick questionnaire    Fear of Current or Ex-Partner: No    Emotionally Abused: No    Physically Abused: No    Sexually Abused: No    FAMILY HISTORY: Family History  Problem Relation Age of Onset   Coronary artery disease Father 63   Diabetes Father    Heart disease Father    Breast cancer  Mother 63   Cancer Mother 18       breast   Aplastic anemia Daughter        died at age 95   Cancer Maternal Aunt 65       ovarian   Cancer Maternal Grandmother 55       ovarian   Cancer Paternal Aunt 51       ovarian/breast/breast   Coronary artery disease Sister 8   Coronary artery disease Brother 44    Review of Systems  Constitutional:  Positive for fatigue. Negative for appetite change, chills, fever and unexpected weight change.  HENT:   Negative for hearing loss, lump/mass and trouble swallowing.   Eyes:  Negative for eye problems and icterus.  Respiratory:  Positive for shortness of breath (chronic). Negative for chest tightness and cough.   Cardiovascular:  Negative for chest pain, leg swelling and palpitations.  Gastrointestinal:  Negative for abdominal distention, abdominal pain, constipation, diarrhea, nausea and vomiting.  Endocrine: Negative for hot flashes.  Genitourinary:  Negative for difficulty urinating.   Musculoskeletal:  Negative for arthralgias.  Skin:  Negative for itching and rash.  Neurological:  Negative for dizziness, extremity weakness, headaches and numbness.  Hematological:  Negative for adenopathy. Does not bruise/bleed easily.  Psychiatric/Behavioral:  Negative for depression. The patient is not nervous/anxious.       PHYSICAL EXAMINATION   Onc Performance Status - 05/30/24 1404       ECOG Perf Status   ECOG Perf Status Ambulatory and capable of all selfcare but unable to carry out any work activities.  Up and about more than 50% of waking hours (P)       KPS SCALE   KPS % SCORE Cares for self, unable to carry on normal activity or to do active work (P)           Vitals:   05/30/24 1401  BP: (!) 155/93  Pulse: 80  Resp: 18  Temp: 97.9 F (36.6 C)  SpO2: 94%    Physical Exam Constitutional:      General: She is not in acute distress.    Appearance: Normal appearance. She is ill-appearing (chronically ill wearing oxygen  and  examined in wheelchair). She is not toxic-appearing.  HENT:     Head: Normocephalic and atraumatic.  Mouth/Throat:     Mouth: Mucous membranes are moist.     Pharynx: Oropharynx is clear. No oropharyngeal exudate or posterior oropharyngeal erythema.  Eyes:     General: No scleral icterus. Cardiovascular:     Rate and Rhythm: Normal rate and regular rhythm.     Pulses: Normal pulses.     Heart sounds: Normal heart sounds.  Pulmonary:     Effort: Pulmonary effort is normal.     Breath sounds: Normal breath sounds.  Abdominal:     General: Abdomen is flat. Bowel sounds are normal. There is no distension.     Palpations: Abdomen is soft.     Tenderness: There is no abdominal tenderness.     Comments: Exam limited by body habitus   Musculoskeletal:        General: No swelling.     Cervical back: Neck supple.  Lymphadenopathy:     Cervical: No cervical adenopathy.  Skin:    General: Skin is warm and dry.     Findings: No rash.  Neurological:     General: No focal deficit present.     Mental Status: She is alert.  Psychiatric:        Mood and Affect: Mood normal.        Behavior: Behavior normal.     LABORATORY DATA:  CBC    Component Value Date/Time   WBC 6.5 05/02/2024 1240   WBC 8.0 05/10/2020 0447   RBC 4.91 05/02/2024 1240   HGB 14.2 05/02/2024 1240   HGB 12.8 08/05/2017 0927   HCT 42.0 05/02/2024 1240   HCT 39.8 08/05/2017 0927   PLT 203 05/02/2024 1240   PLT 267 08/05/2017 0927   MCV 85.5 05/02/2024 1240   MCV 86.7 08/05/2017 0927   MCH 28.9 05/02/2024 1240   MCHC 33.8 05/02/2024 1240   RDW 13.9 05/02/2024 1240   RDW 14.3 08/05/2017 0927   LYMPHSABS 1.8 05/02/2024 1240   LYMPHSABS 2.3 08/05/2017 0927   MONOABS 0.5 05/02/2024 1240   MONOABS 0.4 08/05/2017 0927   EOSABS 0.1 05/02/2024 1240   EOSABS 0.1 08/05/2017 0927   BASOSABS 0.0 05/02/2024 1240   BASOSABS 0.0 08/05/2017 0927    CMP     Component Value Date/Time   NA 140 05/02/2024 1240   NA  141 08/05/2017 0927   K 4.2 05/02/2024 1240   K 4.0 08/05/2017 0927   CL 103 05/02/2024 1240   CL 101 02/07/2013 1125   CO2 31 05/02/2024 1240   CO2 31 (H) 08/05/2017 0927   GLUCOSE 182 (H) 05/02/2024 1240   GLUCOSE 125 08/05/2017 0927   GLUCOSE 193 (H) 02/07/2013 1125   BUN 15 05/02/2024 1240   BUN 22.7 08/05/2017 0927   CREATININE 0.72 05/02/2024 1240   CREATININE 0.8 08/05/2017 0927   CALCIUM 9.4 05/02/2024 1240   CALCIUM 9.7 08/05/2017 0927   PROT 7.5 05/02/2024 1240   PROT 7.3 08/05/2017 0927   ALBUMIN 3.7 05/02/2024 1240   ALBUMIN 3.2 (L) 08/05/2017 0927   AST 56 (H) 05/02/2024 1240   AST 19 08/05/2017 0927   ALT 61 (H) 05/02/2024 1240   ALT 28 08/05/2017 0927   ALKPHOS 86 05/02/2024 1240   ALKPHOS 108 08/05/2017 0927   BILITOT 0.5 05/02/2024 1240   BILITOT 0.34 08/05/2017 0927   GFRNONAA >60 05/02/2024 1240   GFRAA >60 05/10/2020 0447   GFRAA >60 04/18/2020 1023     ASSESSMENT and THERAPY PLAN:   Assessment and Plan Assessment &  Plan Stage IV breast cancer Stage IV breast cancer treated with Herceptin  and letrozole . Fatigue and chest pain noted.  - Administer Herceptin  today. - Most recent echo normal, cardiac exam normal today.  - Continue Letrozole  daily and RTC in 4 weeks for next Herceptin .   Chest discomfort. Intermittent chest pain, possibly stress-related.Has seen Dr. Mclean in the past.  Discussed potential cardiac issues and need for cardiology evaluation. - Recommend cardiology referral for further evaluation of chest pain. - Red flags reviewed - Sent message to Dr. Rolan to see about getting patient in for evaluation.   Chronic obstructive pulmonary disease (COPD) COPD present, unable to use CPAP, contributing to sleep disturbances and fatigue.  Obstructive sleep apnea Obstructive sleep apnea present, not using CPAP therapy, exacerbating insomnia and fatigue.  Chronic insomnia Chronic insomnia likely exacerbated by COPD and obstructive sleep  apnea. Limited sleep, reliance on Ativan , Valium  ineffective. - Follows up with palliative care today about this.      All questions were answered. The patient knows to call the clinic with any problems, questions or concerns. We can certainly see the patient much sooner if necessary.  Total encounter time:30 minutes*in face-to-face visit time, chart review, lab review, care coordination, order entry, and documentation of the encounter time.    Morna Kendall, NP 05/30/24 2:12 PM Medical Oncology and Hematology [facility] [address] [place], [place] [address] Tel. [phone]    Fax. [phone]  *Total Encounter Time as defined by the Centers for Medicare and Medicaid Services includes, in addition to the face-to-face time of a patient visit (documented in the note above) non-face-to-face time: obtaining and reviewing outside history, ordering and reviewing medications, tests or procedures, care coordination (communications with other health care professionals or caregivers) and documentation in the medical record.

## 2024-05-31 ENCOUNTER — Other Ambulatory Visit: Payer: Self-pay | Admitting: Adult Health

## 2024-05-31 DIAGNOSIS — C50212 Malignant neoplasm of upper-inner quadrant of left female breast: Secondary | ICD-10-CM

## 2024-05-31 DIAGNOSIS — C50919 Malignant neoplasm of unspecified site of unspecified female breast: Secondary | ICD-10-CM

## 2024-06-03 ENCOUNTER — Telehealth (HOSPITAL_COMMUNITY): Payer: Self-pay | Admitting: Cardiology

## 2024-06-03 NOTE — Telephone Encounter (Signed)
 Called to confirm/remind patient of their appointment at the [facility] on 05/24/24 .   Appointment:   [] Confirmed  [] Left mess   [] No answer/No voice mail  [x] VM Full/unable to leave message  [] Phone not in service  Patient reminded to bring all medications and/or complete list.  Confirmed patient has transportation. Gave directions, instructed to utilize valet parking.

## 2024-06-06 ENCOUNTER — Ambulatory Visit (HOSPITAL_COMMUNITY)
Admission: RE | Admit: 2024-06-06 | Discharge: 2024-06-06 | Disposition: A | Discharge status: Home / Self Care | Source: Ambulatory Visit | Attending: Cardiology | Admitting: Cardiology

## 2024-06-06 ENCOUNTER — Encounter (HOSPITAL_COMMUNITY): Payer: Self-pay | Admitting: Cardiology

## 2024-06-06 VITALS — BP 142/88 | HR 72 | Ht 64.0 in | Wt 294.0 lb

## 2024-06-06 DIAGNOSIS — G8929 Other chronic pain: Secondary | ICD-10-CM | POA: Insufficient documentation

## 2024-06-06 DIAGNOSIS — C50212 Malignant neoplasm of upper-inner quadrant of left female breast: Secondary | ICD-10-CM | POA: Insufficient documentation

## 2024-06-06 DIAGNOSIS — C50919 Malignant neoplasm of unspecified site of unspecified female breast: Secondary | ICD-10-CM | POA: Diagnosis not present

## 2024-06-06 DIAGNOSIS — I25708 Atherosclerosis of coronary artery bypass graft(s), unspecified, with other forms of angina pectoris: Secondary | ICD-10-CM

## 2024-06-06 DIAGNOSIS — M549 Dorsalgia, unspecified: Secondary | ICD-10-CM | POA: Insufficient documentation

## 2024-06-06 DIAGNOSIS — C7800 Secondary malignant neoplasm of unspecified lung: Secondary | ICD-10-CM

## 2024-06-06 DIAGNOSIS — I25119 Atherosclerotic heart disease of native coronary artery with unspecified angina pectoris: Secondary | ICD-10-CM | POA: Insufficient documentation

## 2024-06-06 DIAGNOSIS — R0789 Other chest pain: Secondary | ICD-10-CM | POA: Diagnosis present

## 2024-06-06 MED ORDER — BISOPROLOL FUMARATE 5 MG PO TABS: 2.5000 mg | ORAL_TABLET | Freq: Every day | ORAL | 3 refills | Status: DC

## 2024-06-06 MED ORDER — NITROGLYCERIN 0.4 MG SL SUBL
0.4000 mg | SUBLINGUAL_TABLET | SUBLINGUAL | 3 refills | Status: DC | PRN
Start: 2024-06-06 — End: 2024-07-08

## 2024-06-06 NOTE — Patient Instructions (Signed)
 START Bisoprolol 2.5 mg ( 1/2 Tab) daily.  TAKE Nitroglycerin  as directed on the bottle.  Your physician recommends that you schedule a follow-up appointment in: 4 months ( February 2026) ** PLEASE CALL THE OFFICE IN DECEMBER TO ARRANGE YOUR FOLLOW UP APPOINTMENT.**  If you have any questions or concerns before your next appointment please send us  a message through [place] or call our office at [phone].    TO LEAVE A MESSAGE FOR THE NURSE SELECT OPTION 2, PLEASE LEAVE A MESSAGE INCLUDING: YOUR NAME DATE OF BIRTH CALL BACK NUMBER REASON FOR CALL**this is important as we prioritize the call backs  YOU WILL RECEIVE A CALL BACK THE SAME DAY AS LONG AS YOU CALL BEFORE 4:00 PM  At the [facility], you and your health needs are our priority. As part of our continuing mission to provide you with exceptional heart care, we have created designated Provider Care Teams. These Care Teams include your primary Cardiologist (physician) and Advanced Practice Providers (APPs- Physician Assistants and Nurse Practitioners) who all work together to provide you with the care you need, when you need it.   You may see any of the following providers on your designated Care Team at your next follow up: Dr Toribio Fuel Dr Ezra Shuck Dr. Ria Commander Dr. Morene Brownie Amy Lenetta, NP Caffie Shed, [place] [facility], [place] Beckey Coe, NP [place] Lee, NP Ellouise Class, NP Tinnie Redman, PharmD Jaun Bash, PharmD   Please be sure to bring in all your medications bottles to every appointment.    Thank you for choosing [facility]

## 2024-06-09 DIAGNOSIS — I5020 Unspecified systolic (congestive) heart failure: Secondary | ICD-10-CM | POA: Diagnosis not present

## 2024-06-09 DIAGNOSIS — R062 Wheezing: Secondary | ICD-10-CM | POA: Diagnosis not present

## 2024-06-09 DIAGNOSIS — M25569 Pain in unspecified knee: Secondary | ICD-10-CM | POA: Diagnosis not present

## 2024-06-10 DIAGNOSIS — I5020 Unspecified systolic (congestive) heart failure: Secondary | ICD-10-CM | POA: Diagnosis not present

## 2024-06-10 DIAGNOSIS — R062 Wheezing: Secondary | ICD-10-CM | POA: Diagnosis not present

## 2024-06-10 DIAGNOSIS — M25569 Pain in unspecified knee: Secondary | ICD-10-CM | POA: Diagnosis not present

## 2024-06-10 NOTE — Progress Notes (Signed)
 Cardio-Oncology Clinic Consult Note   Referring Physician: Morna Kendall Primary Care: Primary Cardiologist:  HPI:  [name] is a 73 y.o. female with past medical history of stage IV breast cancer who has been referred to establish in the cardio-oncology clinic for monitoring of cardio-toxicity while undergoing chemotherapy.        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 Magrinat     05/23/2013 Initial Diagnosis   Malignant neoplasm of upper-inner quadrant of left breast in female, estrogen receptor positive (HCC)         Referred for intermittent chest discomfort. Patient with significant chronic pain issues. She has uncontrolled back pain to the point where she is only able to walk 15 or so steps around the house before she has to stop or pass out from the  pain. She has some intermittent chest pain as well, usually with exertion and improves with her sitting down and resting. She notes that she is not interested in any further scans or invasive procedures, especially if they will not bring her improved quality of life. Her main concern is her weakness and pain, which limits her ability to care of her family.    Medical history and current medications were reviewed in Epic as part of clinic visit.   PHYSICAL EXAM: Vitals:   06/06/24 1438   BP: (!) 142/88  Pulse: 72  SpO2: 94%   GENERAL: NAD, chronically ill appearing PULM:  Normal work of breathing, CTAB CARDIAC:  JVP: flat         Normal rate with regular rhythm. No murmurs, rubs or gallops. Warm and well perfused extremities. ABDOMEN: Soft, non-tender, non-distended. NEUROLOGIC: Patient is oriented x3 with no focal or lateralizing neurologic deficits.      ASSESSMENT & PLAN:  1. Anginal chest pain/CAD: Patient certainly has chest pain that appears consistent with cardiac angina, coming on with exertion or stress. The pain is not what limits her most, that is her chronic back pain, and would be reasonable to treat medically as she is not interested in further noninvasive workup. Even if she would be willing to undergo cardiac catheterization, it would be mainly for symptom relief and she has not yet trialed medical therapy. Discussed this with the patient and she was in agreement. - Start bisoprolol 2.5mg  daily for anginal relief - Provided nitroglycerin  prn for unrelenting chest pain - No further invasive or noninvasive workup indicated or desired - Agree with continued palliative care visits for hopeful quality of life improvements - Not interested in statin therapy, reasonable -  Continue intermittent cardio onc follow up and titration of medical therapy as needed - Recent normal echo reviewed  I spent 50 minutes caring for this patient today including face to face time, ordering and reviewing labs, reviewing records from multiple oncology visits, discussing natural course and management of CAD  and angina, seeing the patient, documenting in the record, and arranging follow ups.    Morene Brownie, MD [facility] Mechanical Circulatory Support Cardio-Oncology 06/10/24

## 2024-06-13 ENCOUNTER — Other Ambulatory Visit: Payer: Self-pay | Admitting: Hematology and Oncology

## 2024-06-13 DIAGNOSIS — C50919 Malignant neoplasm of unspecified site of unspecified female breast: Secondary | ICD-10-CM

## 2024-06-15 ENCOUNTER — Other Ambulatory Visit (HOSPITAL_COMMUNITY): Payer: Self-pay

## 2024-06-15 ENCOUNTER — Telehealth: Payer: Self-pay

## 2024-06-15 NOTE — Telephone Encounter (Signed)
-----   Message from Nurse Leita B sent at 06/15/2024  4:14 PM EDT ----- Regarding: schedule Hi,  Pt called and was concerned about why she hasn't been scheduled for her November infusion. Could you please call her to schedule? She received clearance from Dr Zenaida.   [place], LPN

## 2024-06-23 ENCOUNTER — Other Ambulatory Visit: Payer: Self-pay

## 2024-06-23 DIAGNOSIS — C50919 Malignant neoplasm of unspecified site of unspecified female breast: Secondary | ICD-10-CM

## 2024-06-23 DIAGNOSIS — F419 Anxiety disorder, unspecified: Secondary | ICD-10-CM

## 2024-06-23 MED ORDER — DIAZEPAM 5 MG PO TABS: 5.0000 mg | ORAL_TABLET | Freq: Two times a day (BID) | ORAL | 0 refills | Status: DC | PRN

## 2024-06-27 ENCOUNTER — Encounter: Payer: Self-pay | Admitting: Hematology and Oncology

## 2024-06-27 ENCOUNTER — Inpatient Hospital Stay: Attending: Oncology

## 2024-06-27 VITALS — BP 165/65 | HR 70 | Temp 97.9°F | Resp 16

## 2024-06-27 DIAGNOSIS — C50212 Malignant neoplasm of upper-inner quadrant of left female breast: Secondary | ICD-10-CM | POA: Diagnosis not present

## 2024-06-27 DIAGNOSIS — C7800 Secondary malignant neoplasm of unspecified lung: Secondary | ICD-10-CM | POA: Insufficient documentation

## 2024-06-27 DIAGNOSIS — C50919 Malignant neoplasm of unspecified site of unspecified female breast: Secondary | ICD-10-CM

## 2024-06-27 DIAGNOSIS — Z170 Estrogen receptor positive status [ER+]: Secondary | ICD-10-CM | POA: Diagnosis not present

## 2024-06-27 DIAGNOSIS — Z5112 Encounter for antineoplastic immunotherapy: Secondary | ICD-10-CM | POA: Diagnosis present

## 2024-06-27 MED ORDER — SODIUM CHLORIDE 0.9 % IV SOLN: Freq: Once | INTRAVENOUS | Status: AC

## 2024-06-27 MED ORDER — DIPHENHYDRAMINE HCL 25 MG PO CAPS
50.0000 mg | ORAL_CAPSULE | Freq: Once | ORAL | Status: AC
  Administered 2024-06-27: 50 mg via ORAL
  Filled 2024-06-27: qty 2

## 2024-06-27 MED ORDER — TRASTUZUMAB-ANNS CHEMO 150 MG IV SOLR
750.0000 mg | Freq: Once | INTRAVENOUS | Status: AC
  Administered 2024-06-27: 750 mg via INTRAVENOUS
  Filled 2024-06-27: qty 35.71

## 2024-06-27 MED ORDER — LORAZEPAM 2 MG/ML IJ SOLN
0.5000 mg | Freq: Once | INTRAMUSCULAR | Status: AC
  Administered 2024-06-27: 0.5 mg via INTRAVENOUS
  Filled 2024-06-27: qty 1

## 2024-06-27 MED ORDER — ACETAMINOPHEN 325 MG PO TABS
650.0000 mg | ORAL_TABLET | Freq: Once | ORAL | Status: AC
  Administered 2024-06-27: 650 mg via ORAL
  Filled 2024-06-27: qty 2

## 2024-06-27 NOTE — Patient Instructions (Signed)
 [facility] - A DEPT OF [facility]  Discharge Instructions: Thank you for choosing [facility] to provide your oncology and hematology care.   If you have a lab appointment with the Cancer Center, please go directly to the Cancer Center and check in at the registration area.   Wear comfortable clothing and clothing appropriate for easy access to any Portacath or PICC line.   We strive to give you quality time with your provider. You may need to reschedule your appointment if you arrive late (15 or more minutes).  Arriving late affects you and other patients whose appointments are after yours.  Also, if you miss three or more appointments without notifying the office, you may be dismissed from the clinic at the provider's discretion.      For prescription refill requests, have your pharmacy contact our office and allow 72 hours for refills to be completed.    Today you received the following chemotherapy and/or immunotherapy agents: trastuzumab-anns      To help prevent nausea and vomiting after your treatment, we encourage you to take your nausea medication as directed.  BELOW ARE SYMPTOMS THAT SHOULD BE REPORTED IMMEDIATELY: *FEVER GREATER THAN 100.4 F (38 C) OR HIGHER *CHILLS OR SWEATING *NAUSEA AND VOMITING THAT IS NOT CONTROLLED WITH YOUR NAUSEA MEDICATION *UNUSUAL SHORTNESS OF BREATH *UNUSUAL BRUISING OR BLEEDING *URINARY PROBLEMS (pain or burning when urinating, or frequent urination) *BOWEL PROBLEMS (unusual diarrhea, constipation, pain near the anus) TENDERNESS IN MOUTH AND THROAT WITH OR WITHOUT PRESENCE OF ULCERS (sore throat, sores in mouth, or a toothache) UNUSUAL RASH, SWELLING OR PAIN  UNUSUAL VAGINAL DISCHARGE OR ITCHING   Items with * indicate a potential emergency and should be followed up as soon as possible or go to the Emergency Department if any problems should occur.  Please show the CHEMOTHERAPY ALERT CARD or  IMMUNOTHERAPY ALERT CARD at check-in to the Emergency Department and triage nurse.  Should you have questions after your visit or need to cancel or reschedule your appointment, please contact [facility] - A DEPT OF [facility]  Dept: [phone]  and follow the prompts.  Office hours are 8:00 a.m. to 4:30 p.m. Monday - Friday. Please note that voicemails left after 4:00 p.m. may not be returned until the following business day.  We are closed weekends and major holidays. You have access to a nurse at all times for urgent questions. Please call the main number to the clinic Dept: [phone] and follow the prompts.   For any non-urgent questions, you may also contact your provider using MyChart. We now offer e-Visits for anyone 73 and older to request care online for non-urgent symptoms. For details visit [email or web address].   Also download the MyChart app! Go to the app store, search "MyChart", open the app, select [facility], and log in with your MyChart username and password.

## 2024-06-29 NOTE — Progress Notes (Signed)
 [name]                                          MRN: [id number]   06/29/2024   The [facility] Quality Team Specialist reviewed this patient medical record for the purposes of chart review for care gap closure. The following were reviewed: abstraction for care gap closure-glycemic status assessment.    [facility]BCI Quality Team

## 2024-07-05 ENCOUNTER — Telehealth: Payer: Self-pay

## 2024-07-05 NOTE — Telephone Encounter (Signed)
 Patient calls nurse line requesting assistance with getting re-certified with portable oxygen .  While speaking with patient, she reports that she has been having issues with her oxygen  levels dropping down to 88 and heart rate dropping into the 30's. She reports that this has been occurring for the last month.   She also reports concerns with waking up and feeling dizzy. She states that she has diagnosed sleep apnea, however, is unable to tolerate a CPAP machine. She reports that dizziness improves shortly after taking a few deep breaths and eating breakfast.    Patient denies current chest pain, SHOB, dizziness, she checked her measurements while on the phone. SpO2: 98%, HR: 38. She reports that she is feeling great right now. She is speaking in complete sentences and is alert and oriented. She has had O2 monitor for several years and has just replaced the batteries. She is unsure if heart rate reading is completley accurate. I asked patient if she was still taking bisoprolol. She states that she only took a few doses of this because medication caused her heart rate to drop even lower.   Advised that patient receive evaluation for heart rate and oxygen  saturations. She does not want to come into our office due to our office seeing sick patients. Spoke with attending, Dr. Orie regarding patient. Agreed with cardiology visit for follow up and to provide patient with ED precautions.   Called placed to cardiology office and scheduled patient for follow up on 11/25. Called patient and informed of appointment time.   Patient appreciative. Strict ED precautions discussed. Patient voices understanding.   Chiquita JAYSON English, RN

## 2024-07-08 ENCOUNTER — Other Ambulatory Visit: Payer: Self-pay

## 2024-07-08 ENCOUNTER — Encounter (HOSPITAL_COMMUNITY): Payer: Self-pay | Admitting: *Deleted

## 2024-07-08 ENCOUNTER — Observation Stay (HOSPITAL_COMMUNITY)
Admission: EM | Admit: 2024-07-08 | Discharge: 2024-07-09 | Disposition: A | Discharge status: Home / Self Care | Attending: Emergency Medicine | Admitting: Emergency Medicine

## 2024-07-08 ENCOUNTER — Encounter (HOSPITAL_COMMUNITY): Payer: Self-pay

## 2024-07-08 ENCOUNTER — Emergency Department (HOSPITAL_COMMUNITY)

## 2024-07-08 DIAGNOSIS — G4733 Obstructive sleep apnea (adult) (pediatric): Secondary | ICD-10-CM | POA: Insufficient documentation

## 2024-07-08 DIAGNOSIS — R079 Chest pain, unspecified: Secondary | ICD-10-CM | POA: Diagnosis present

## 2024-07-08 DIAGNOSIS — R0602 Shortness of breath: Secondary | ICD-10-CM | POA: Diagnosis present

## 2024-07-08 DIAGNOSIS — T451X5A Adverse effect of antineoplastic and immunosuppressive drugs, initial encounter: Secondary | ICD-10-CM | POA: Diagnosis not present

## 2024-07-08 DIAGNOSIS — I442 Atrioventricular block, complete: Secondary | ICD-10-CM

## 2024-07-08 DIAGNOSIS — Z79899 Other long term (current) drug therapy: Secondary | ICD-10-CM | POA: Insufficient documentation

## 2024-07-08 DIAGNOSIS — E1149 Type 2 diabetes mellitus with other diabetic neurological complication: Secondary | ICD-10-CM | POA: Diagnosis present

## 2024-07-08 DIAGNOSIS — Z87891 Personal history of nicotine dependence: Secondary | ICD-10-CM | POA: Diagnosis not present

## 2024-07-08 DIAGNOSIS — E119 Type 2 diabetes mellitus without complications: Secondary | ICD-10-CM

## 2024-07-08 DIAGNOSIS — Z853 Personal history of malignant neoplasm of breast: Secondary | ICD-10-CM | POA: Diagnosis not present

## 2024-07-08 DIAGNOSIS — J449 Chronic obstructive pulmonary disease, unspecified: Secondary | ICD-10-CM | POA: Insufficient documentation

## 2024-07-08 DIAGNOSIS — R6889 Other general symptoms and signs: Secondary | ICD-10-CM | POA: Diagnosis not present

## 2024-07-08 DIAGNOSIS — R4181 Age-related cognitive decline: Secondary | ICD-10-CM | POA: Insufficient documentation

## 2024-07-08 DIAGNOSIS — I10 Essential (primary) hypertension: Secondary | ICD-10-CM | POA: Diagnosis present

## 2024-07-08 DIAGNOSIS — R001 Bradycardia, unspecified: Secondary | ICD-10-CM | POA: Diagnosis not present

## 2024-07-08 DIAGNOSIS — Z794 Long term (current) use of insulin: Secondary | ICD-10-CM | POA: Insufficient documentation

## 2024-07-08 DIAGNOSIS — J9611 Chronic respiratory failure with hypoxia: Secondary | ICD-10-CM | POA: Insufficient documentation

## 2024-07-08 DIAGNOSIS — I499 Cardiac arrhythmia, unspecified: Secondary | ICD-10-CM | POA: Diagnosis not present

## 2024-07-08 DIAGNOSIS — C50919 Malignant neoplasm of unspecified site of unspecified female breast: Secondary | ICD-10-CM | POA: Diagnosis present

## 2024-07-08 DIAGNOSIS — I5032 Chronic diastolic (congestive) heart failure: Secondary | ICD-10-CM | POA: Diagnosis not present

## 2024-07-08 DIAGNOSIS — Z6841 Body Mass Index (BMI) 40.0 and over, adult: Secondary | ICD-10-CM | POA: Diagnosis not present

## 2024-07-08 DIAGNOSIS — Z7985 Long-term (current) use of injectable non-insulin antidiabetic drugs: Secondary | ICD-10-CM | POA: Insufficient documentation

## 2024-07-08 DIAGNOSIS — Z9981 Dependence on supplemental oxygen: Secondary | ICD-10-CM | POA: Diagnosis not present

## 2024-07-08 DIAGNOSIS — Z743 Need for continuous supervision: Secondary | ICD-10-CM | POA: Diagnosis not present

## 2024-07-08 LAB — COMPREHENSIVE METABOLIC PANEL WITH GFR
ALT: 41 U/L (ref 0–44)
ALT: 38 U/L (ref 0–44)
AST: 38 U/L (ref 15–41)
AST: 34 U/L (ref 15–41)
Albumin: 3.5 g/dL (ref 3.5–5.0)
Albumin: 2.9 g/dL — ABNORMAL LOW (ref 3.5–5.0)
Alkaline Phosphatase: 91 U/L (ref 38–126)
Alkaline Phosphatase: 73 U/L (ref 38–126)
Anion gap: 10 (ref 5–15)
Anion gap: 11 (ref 5–15)
BUN: 21 mg/dL (ref 8–23)
BUN: 19 mg/dL (ref 8–23)
CO2: 28 mmol/L (ref 22–32)
CO2: 29 mmol/L (ref 22–32)
Calcium: 9.3 mg/dL (ref 8.9–10.3)
Calcium: 9 mg/dL (ref 8.9–10.3)
Chloride: 103 mmol/L (ref 98–111)
Chloride: 101 mmol/L (ref 98–111)
Creatinine, Ser: 0.76 mg/dL (ref 0.44–1.00)
Creatinine, Ser: 0.83 mg/dL (ref 0.44–1.00)
GFR, Estimated: >60 mL/min (ref >60)
GFR, Estimated: >60 mL/min (ref >60)
Glucose, Bld: 190 mg/dL — ABNORMAL HIGH (ref 70–99)
Glucose, Bld: 149 mg/dL — ABNORMAL HIGH (ref 70–99)
Potassium: 3.8 mmol/L (ref 3.5–5.1)
Potassium: 3.9 mmol/L (ref 3.5–5.1)
Sodium: 140 mmol/L (ref 135–145)
Sodium: 141 mmol/L (ref 135–145)
Total Bilirubin: 0.5 mg/dL (ref 0.0–1.2)
Total Bilirubin: 0.9 mg/dL (ref 0.0–1.2)
Total Protein: 7 g/dL (ref 6.5–8.1)
Total Protein: 6.5 g/dL (ref 6.5–8.1)

## 2024-07-08 LAB — TROPONIN T, HIGH SENSITIVITY: Troponin T High Sensitivity: 17 ng/L (ref 0–19)

## 2024-07-08 LAB — CBC WITH DIFFERENTIAL/PLATELET
Abs Immature Granulocytes: 0.02 K/uL (ref 0.00–0.07)
Abs Immature Granulocytes: 0.01 K/uL (ref 0.00–0.07)
Basophils Absolute: 0 K/uL (ref 0.0–0.1)
Basophils Absolute: 0 K/uL (ref 0.0–0.1)
Basophils Relative: 1 %
Basophils Relative: 0 %
Eosinophils Absolute: 0.1 K/uL (ref 0.0–0.5)
Eosinophils Absolute: 0.1 K/uL (ref 0.0–0.5)
Eosinophils Relative: 2 %
Eosinophils Relative: 2 %
HCT: 39 % (ref 36.0–46.0)
HCT: 37.3 % (ref 36.0–46.0)
Hemoglobin: 12.6 g/dL (ref 12.0–15.0)
Hemoglobin: 12.2 g/dL (ref 12.0–15.0)
Immature Granulocytes: 0 %
Immature Granulocytes: 0 %
Lymphocytes Relative: 24 %
Lymphocytes Relative: 22 %
Lymphs Abs: 1.6 K/uL (ref 0.7–4.0)
Lymphs Abs: 1.5 K/uL (ref 0.7–4.0)
MCH: 29.3 pg (ref 26.0–34.0)
MCH: 28.9 pg (ref 26.0–34.0)
MCHC: 32.3 g/dL (ref 30.0–36.0)
MCHC: 32.7 g/dL (ref 30.0–36.0)
MCV: 90.7 fL (ref 80.0–100.0)
MCV: 88.4 fL (ref 80.0–100.0)
Monocytes Absolute: 0.4 K/uL (ref 0.1–1.0)
Monocytes Absolute: 0.5 K/uL (ref 0.1–1.0)
Monocytes Relative: 6 %
Monocytes Relative: 8 %
Neutro Abs: 4.5 K/uL (ref 1.7–7.7)
Neutro Abs: 4.4 K/uL (ref 1.7–7.7)
Neutrophils Relative %: 67 %
Neutrophils Relative %: 68 %
Platelets: 197 K/uL (ref 150–400)
Platelets: 197 K/uL (ref 150–400)
RBC: 4.3 MIL/uL (ref 3.87–5.11)
RBC: 4.22 MIL/uL (ref 3.87–5.11)
RDW: 14.4 % (ref 11.5–15.5)
RDW: 14.3 % (ref 11.5–15.5)
WBC: 6.6 K/uL (ref 4.0–10.5)
WBC: 6.5 K/uL (ref 4.0–10.5)
nRBC: 0 % (ref 0.0–0.2)
nRBC: 0 % (ref 0.0–0.2)

## 2024-07-08 LAB — TYPE AND SCREEN
ABO/RH(D): A POS
Antibody Screen: NEGATIVE

## 2024-07-08 LAB — PROTIME-INR
INR: 1.1 (ref 0.8–1.2)
Prothrombin Time: 14.9 s (ref 11.4–15.2)

## 2024-07-08 LAB — MAGNESIUM: Magnesium: 1.9 mg/dL (ref 1.7–2.4)

## 2024-07-08 LAB — GLUCOSE, CAPILLARY: Glucose-Capillary: 130 mg/dL — ABNORMAL HIGH (ref 70–99)

## 2024-07-08 LAB — MRSA NEXT GEN BY PCR, NASAL: MRSA by PCR Next Gen: NOT DETECTED

## 2024-07-08 LAB — PRO BRAIN NATRIURETIC PEPTIDE: Pro Brain Natriuretic Peptide: 366 pg/mL — ABNORMAL HIGH (ref <300.0)

## 2024-07-08 LAB — TROPONIN I (HIGH SENSITIVITY): Troponin I (High Sensitivity): 28 ng/L — ABNORMAL HIGH (ref <18)

## 2024-07-08 MED ORDER — INSULIN GLARGINE-YFGN 100 UNIT/ML ~~LOC~~ SOLN
10.0000 [IU] | Freq: Every day | SUBCUTANEOUS | Status: DC
  Administered 2024-07-09: 10 [IU] via SUBCUTANEOUS
  Filled 2024-07-08: qty 0.1

## 2024-07-08 MED ORDER — SODIUM CHLORIDE 0.9% FLUSH
10.0000 mL | Freq: Two times a day (BID) | INTRAVENOUS | Status: DC
  Administered 2024-07-08 – 2024-07-09 (×2): 10 mL

## 2024-07-08 MED ORDER — ALBUTEROL SULFATE (2.5 MG/3ML) 0.083% IN NEBU: 3.0000 mL | INHALATION_SOLUTION | Freq: Four times a day (QID) | RESPIRATORY_TRACT | Status: DC | PRN

## 2024-07-08 MED ORDER — ORAL CARE MOUTH RINSE: 15.0000 mL | OROMUCOSAL | Status: DC | PRN

## 2024-07-08 MED ORDER — DOCUSATE SODIUM 100 MG PO CAPS: 100.0000 mg | ORAL_CAPSULE | Freq: Two times a day (BID) | ORAL | Status: DC | PRN

## 2024-07-08 MED ORDER — INSULIN ASPART 100 UNIT/ML IJ SOLN
0.0000 [IU] | INTRAMUSCULAR | Status: DC
  Administered 2024-07-08: 2 [IU] via SUBCUTANEOUS
  Administered 2024-07-09: 3 [IU] via SUBCUTANEOUS
  Administered 2024-07-09 (×2): 2 [IU] via SUBCUTANEOUS
  Filled 2024-07-08 (×3): qty 2
  Filled 2024-07-08: qty 3

## 2024-07-08 MED ORDER — FUROSEMIDE 10 MG/ML IJ SOLN: 40.0000 mg | Freq: Once | INTRAMUSCULAR | Status: DC

## 2024-07-08 MED ORDER — CHLORHEXIDINE GLUCONATE CLOTH 2 % EX PADS
6.0000 | MEDICATED_PAD | Freq: Every day | CUTANEOUS | Status: DC
  Administered 2024-07-08 – 2024-07-09 (×2): 6 via TOPICAL

## 2024-07-08 MED ORDER — POLYETHYLENE GLYCOL 3350 17 G PO PACK: 17.0000 g | PACK | Freq: Every day | ORAL | Status: DC | PRN

## 2024-07-08 MED ORDER — LETROZOLE 2.5 MG PO TABS
2.5000 mg | ORAL_TABLET | Freq: Every day | ORAL | Status: DC
  Administered 2024-07-08 – 2024-07-09 (×2): 2.5 mg via ORAL
  Filled 2024-07-08 (×2): qty 1

## 2024-07-08 MED ORDER — SODIUM CHLORIDE 0.9% FLUSH: 10.0000 mL | INTRAVENOUS | Status: DC | PRN

## 2024-07-08 MED ORDER — ACETAMINOPHEN 325 MG PO TABS
650.0000 mg | ORAL_TABLET | Freq: Once | ORAL | Status: AC
  Administered 2024-07-08: 650 mg via ORAL
  Filled 2024-07-08: qty 2

## 2024-07-08 MED ORDER — ONDANSETRON HCL 4 MG/2ML IJ SOLN
4.0000 mg | Freq: Four times a day (QID) | INTRAMUSCULAR | Status: DC | PRN
Start: 2024-07-08 — End: 2024-07-09
  Administered 2024-07-09: 4 mg via INTRAVENOUS
  Filled 2024-07-08: qty 2

## 2024-07-08 NOTE — ED Triage Notes (Addendum)
 Here by POV from home for intermittent CP with HR in 30s. Sx onset weeks ago. PCP scheduled appt with cardiology. Pt presents d/t progressive worsening sx. States, I do not feel well. Endorses CP, sob, light headed, dizziness, fatigue, weakness, and some confusion. Wearing O2 [place] at baseline. Pt of [facility]. Card appt scheduled for 11/25. Alert, NAD, calm, skin W&D, HR 41.

## 2024-07-08 NOTE — ED Provider Notes (Signed)
 [place] EMERGENCY DEPARTMENT AT [facility] Provider Note   CSN: [id number] Arrival date & time: 07/08/24  1706     Patient presents with: Chest Pain   [name] is a 73 y.o. female.   PMH includes stage IV breast cancer with metasteses to the lung, chemo induced HFpEF, htn, type 2 diabetes and chronic back pain presenting with 3 weeks of worsening chest pain. She reports that she has been having intermittent chest pain since October, and recently saw cardiology who started her on bisoprolol  and nitroglycerin .  She reports that in the last 3 weeks since she was seen there her chest pain has gotten worse and she has been experiencing difficulty with activity as well as orthopnea at night. Today she felt like she couldn't catch her breath and her pain was the worst it has been so she called her PCP office and the nurse in triage recommended she come to the ED for evaluation. She also reports brain fog and difficulty concentrating along with the above symptoms.    Chest Pain      Prior to Admission medications   Medication Sig Start Date End Date Taking? Authorizing Provider  albuterol  (VENTOLIN  HFA) 108 (90 Base) MCG/ACT inhaler INHALE 2 PUFFS INTO THE LUNGS EVERY 6 HOURS AS NEEDED FOR WHEEZE 11/15/23   [name], MD  B-D ULTRAFINE III SHORT PEN 31G X 8 MM MISC PLEASE USE TO INJECT INSULIN  4 TIMES DAILY. 06/17/23   Jeanelle Layman CROME, MD  bisoprolol  (ZEBETA ) 5 MG tablet Take 0.5 tablets (2.5 mg total) by mouth daily. 06/06/24   Zenaida Morene PARAS, MD  Blood Glucose Monitoring Suppl (ONE TOUCH ULTRA 2) w/Device KIT 1 kit by Does not apply route QID. ICD 10-code: E11.49. 02/19/22   Jeanelle Layman CROME, MD  Continuous Glucose Sensor (FREESTYLE LIBRE 2 PLUS SENSOR) MISC Change sensor every 15 days. 04/11/24   Donzetta Rollene BRAVO, MD  diazepam  (VALIUM ) 5 MG tablet Take 1 tablet (5 mg total) by mouth every 12 (twelve) hours as needed for anxiety. 06/23/24    Pickenpack-Cousar, Fannie SAILOR, NP  glucose blood (ONETOUCH ULTRA) test strip Use as instructed 04/11/24   Donzetta Rollene BRAVO, MD  insulin  glargine (LANTUS  SOLOSTAR) 100 UNIT/ML Solostar Pen Inject 20 Units into the skin daily. 04/11/24   Donzetta Rollene BRAVO, MD  insulin  lispro (HUMALOG ) 100 UNIT/ML KwikPen Inject 8-10 Units into the skin 3 (three) times daily. 11/15/23   [name], MD  Insulin  Pen Needle (PEN NEEDLES) 30G X 8 MM MISC Please use to inject insulin  4 times daily. 08/14/21   Delores Suzann HERO, MD  letrozole  (FEMARA ) 2.5 MG tablet TAKE 1 TABLET BY MOUTH EVERY DAY 05/12/23   Iruku, Praveena, MD  loperamide  (IMODIUM ) 2 MG capsule Take 1 capsule (2 mg total) by mouth as needed for diarrhea or loose stools. 02/01/24   Pickenpack-Cousar, Fannie SAILOR, NP  nitroGLYCERIN  (NITROSTAT ) 0.4 MG SL tablet Place 1 tablet (0.4 mg total) under the tongue every 5 (five) minutes as needed for chest pain. 06/06/24 09/04/24  Zenaida Morene PARAS, MD  ondansetron  (ZOFRAN ) 8 MG tablet TAKE 1 TABLET BY MOUTH EVERY 8 HOURS AS NEEDED 04/13/24   Pickenpack-Cousar, Fannie SAILOR, NP  OXYGEN  Inhale 3 L into the lungs.    [provider]  Semaglutide , 2 MG/DOSE, (OZEMPIC , 2 MG/DOSE,) 8 MG/3ML SOPN Inject 2 mg into the skin once a week. 05/19/24   [name], MD    Allergies: Meperidine hcl, Penicillins, and Aspirin   Review of Systems  Cardiovascular:  Positive for chest pain.    Updated Vital Signs BP (!) 188/156   Pulse (!) 38   Temp 98.4 F (36.9 C) (Oral)   Resp 20   Wt 133.4 kg   SpO2 98%   BMI 50.46 kg/m   Physical Exam Constitutional:      Appearance: She is well-developed.  HENT:     Head: Normocephalic and atraumatic.  Eyes:     Extraocular Movements: Extraocular movements intact.     Pupils: Pupils are equal, round, and reactive to light.  Neck:     Vascular: JVD present.  Cardiovascular:     Rate and Rhythm: Regular rhythm. Bradycardia present.     Pulses:          Carotid pulses are  1+ on the right side and 1+ on the left side.      Radial pulses are 1+ on the right side and 1+ on the left side.       Dorsalis pedis pulses are 1+ on the right side and 1+ on the left side.       Posterior tibial pulses are 1+ on the right side and 1+ on the left side.     Heart sounds: Normal heart sounds.  Pulmonary:     Effort: Pulmonary effort is normal.     Breath sounds: Decreased breath sounds present. No wheezing, rhonchi or rales.  Abdominal:     General: Bowel sounds are normal.     Palpations: Abdomen is soft.  Musculoskeletal:        General: Normal range of motion.     Right lower leg: Edema present.     Left lower leg: Edema present.  Skin:    General: Skin is warm and dry.     Capillary Refill: Capillary refill takes less than 2 seconds.  Neurological:     Mental Status: She is alert.     (all labs ordered are listed, but only abnormal results are displayed) Labs Reviewed  CBC WITH DIFFERENTIAL/PLATELET  COMPREHENSIVE METABOLIC PANEL WITH GFR  PRO BRAIN NATRIURETIC PEPTIDE  TROPONIN T, HIGH SENSITIVITY  TROPONIN T, HIGH SENSITIVITY    EKG: EKG Interpretation Date/Time:  Friday July 08 2024 17:48:34 EST Ventricular Rate:  41 PR Interval:    QRS Duration:  154 QT Interval:  461 QTC Calculation: 381 R Axis:   40  Text Interpretation: new  Complete AV block with wide QRS complex Right bundle branch block Confirmed by Doretha Folks ([id number]) on 07/08/2024 6:29:26 PM  Radiology: DG Chest Portable 1 View Result Date: 07/08/2024 CLINICAL DATA:  Chest pain EXAM: PORTABLE CHEST 1 VIEW COMPARISON:  CXR 05/03/2020 FINDINGS: Left chest port is stable in position. The heart is enlarged. The lungs are clear. No pneumothorax or pleural effusion. No acute fractures. IMPRESSION: No active disease. Cardiomegaly. Electronically Signed   By: Greig Pique M.D.   On: 07/08/2024 17:48     Procedures   Medications Ordered in the ED - No data to display                                   Medical Decision Making This is an elderly female patient with a complex medical history presenting with 3 weeks of worsening chest pain, shortness of breath and orthopnea.  Initial EKG concerning for heart block, repeat EKG shows complete heart block.  Cardiology contacted and will  arrange for transfer to [facility] for further evaluation.  Initial lab work obtained while waiting in the ED includes CBC, CMP, troponins, BNP  Amount and/or Complexity of Data Reviewed Labs: ordered. Radiology: ordered.    Final diagnoses:  Complete heart block [facility])    ED Discharge Orders     None          Cleotilde Lukes, DO 07/08/24 1907    Doretha Folks, MD 07/08/24 2311

## 2024-07-08 NOTE — H&P (Signed)
 Cardiology Admission History and Physical   Patient ID: [name] MRN: [id number]; DOB: 1951/04/01   Admission date: 07/08/2024  PCP:  [name], MD   [facility] Providers Cardiologist:  None  Chief Complaint: Complete heart block  HPI: [name] is a 73 y.o. female with past medical history notable stage IV breast cancer with mets to the lungs, reported chemo induced HFpEF, hypertension, dyslipidemia, diabetes mellitus type 2, obesity BMI 50, COPD, and chronic back pain admitted to [facility] CICU on 07/08/2024 for complete heart block.  She has known history of intermittent anginal chest pain since 05/2024 for which she saw cardiology 06/06/2024 and was started on bisoprolol  and nitroglycerin  as needed.  At that time she described chest discomfort brought on by exertion and relieved with rest.  Notes from the visit documented discussion with the patient regarding various options including coronary angiography, inpatient not being interested in any further scans or invasive procedures especially if they will not bring improve quality of life.  The patient's main concern at that time was weakness and pain.  Patient was also not interested in statin therapy.  Today she presented to [facility] ED with progressive chest pain over the last 3 weeks as well as orthopnea.  He felt like she could not catch her breath and was told to come to the ED by her PCP.  In the ED, she was afebrile, blood pressure 188/156, heart rate 39, satting appropriately on room air.  Initial labs showed reassuring CBC with hemoglobin 12.6, WBC 6.6, platelets 197, CMP with sodium 140, potassium 3.8, creatinine 0.76, glucose 190, normal AST, ALT, and ALP.  First HST was 17.  proBNP was 366.  Twelve-lead ECG showed complete heart block with right bundle branch block ventricular rate 41. CT C/A/P was ordered and the plan was made to transfer to [facility] CICU for further diagnostic  workup and management.  On arrival to [facility] CICU, she complained of no chest pain, dyspnea or palpitations.  She was resting comfortably in her recliner.  She had some questions about indications for permanent pacemaker and procedure itself.  We went over the procedure details and she expressed desire to proceed.  She also noted she would like to be full code.  Past Medical History:  Diagnosis Date   Arthritis    Back pain    Breast cancer (HCC) dx'd 11/2012   left   Chest pain    COPD (chronic obstructive pulmonary disease) (HCC)    Diabetes mellitus without complication (HCC) 09/28/2014   Ear pain    Fatty liver 6/03   Hypertension    Lung disease    Lung metastases dx'd 11/2012   Obesity, unspecified    Other abnormal glucose    Suicide attempt (HCC) 1996   Syncope and collapse    Unspecified sleep apnea    Past Surgical History:  Procedure Laterality Date   CARDIAC CATHETERIZATION     2007   CHOLECYSTECTOMY     TUBAL LIGATION       Medications Prior to Admission: Prior to Admission medications   Medication Sig Start Date End Date Taking? Authorizing Provider  albuterol  (VENTOLIN  HFA) 108 (90 Base) MCG/ACT inhaler INHALE 2 PUFFS INTO THE LUNGS EVERY 6 HOURS AS NEEDED FOR WHEEZE 11/15/23   [name], MD  B-D ULTRAFINE III SHORT PEN 31G X 8 MM MISC PLEASE USE TO INJECT INSULIN  4 TIMES DAILY. 06/17/23   Jeanelle Na  L, MD  bisoprolol  (ZEBETA ) 5 MG tablet Take 0.5 tablets (2.5 mg total) by mouth daily. 06/06/24   Zenaida Morene PARAS, MD  Blood Glucose Monitoring Suppl (ONE TOUCH ULTRA 2) w/Device KIT 1 kit by Does not apply route QID. ICD 10-code: E11.49. 02/19/22   Jeanelle Layman CROME, MD  Continuous Glucose Sensor (FREESTYLE LIBRE 2 PLUS SENSOR) MISC Change sensor every 15 days. 04/11/24   Donzetta Rollene BRAVO, MD  diazepam  (VALIUM ) 5 MG tablet Take 1 tablet (5 mg total) by mouth every 12 (twelve) hours as needed for anxiety. 06/23/24   Pickenpack-Cousar,  Fannie SAILOR, NP  glucose blood (ONETOUCH ULTRA) test strip Use as instructed 04/11/24   Donzetta Rollene BRAVO, MD  insulin  glargine (LANTUS  SOLOSTAR) 100 UNIT/ML Solostar Pen Inject 20 Units into the skin daily. 04/11/24   Donzetta Rollene BRAVO, MD  insulin  lispro (HUMALOG ) 100 UNIT/ML KwikPen Inject 8-10 Units into the skin 3 (three) times daily. 11/15/23   [name], MD  Insulin  Pen Needle (PEN NEEDLES) 30G X 8 MM MISC Please use to inject insulin  4 times daily. 08/14/21   Delores Suzann HERO, MD  letrozole  (FEMARA ) 2.5 MG tablet TAKE 1 TABLET BY MOUTH EVERY DAY 05/12/23   Iruku, Praveena, MD  loperamide  (IMODIUM ) 2 MG capsule Take 1 capsule (2 mg total) by mouth as needed for diarrhea or loose stools. 02/01/24   Pickenpack-Cousar, Athena N, NP  nitroGLYCERIN  (NITROSTAT ) 0.4 MG SL tablet Place 1 tablet (0.4 mg total) under the tongue every 5 (five) minutes as needed for chest pain. 06/06/24 09/04/24  Zenaida Morene PARAS, MD  ondansetron  (ZOFRAN ) 8 MG tablet TAKE 1 TABLET BY MOUTH EVERY 8 HOURS AS NEEDED 04/13/24   Pickenpack-Cousar, Fannie SAILOR, NP  OXYGEN  Inhale 3 L into the lungs.    [provider]  Semaglutide , 2 MG/DOSE, (OZEMPIC , 2 MG/DOSE,) 8 MG/3ML SOPN Inject 2 mg into the skin once a week. 05/19/24   [name], MD     Allergies:    Allergies  Allergen Reactions   Meperidine Hcl Anaphylaxis   Penicillins Anaphylaxis    Has patient had a PCN reaction causing immediate rash, facial/tongue/throat swelling, SOB or lightheadedness with hypotension: yes Has patient had a PCN reaction causing severe rash involving mucus membranes or skin necrosis: no Has patient had a PCN reaction that required hospitalization yes Has patient had a PCN reaction occurring within the last 10 years: no If all of the above answers are NO, then may proceed with Cephalosporin use.    Aspirin  Nausea And Vomiting    Social History:   Social History   Socioeconomic History   Marital status: Divorced    Spouse  name: Not on file   Number of children: 4   Years of education: 12   Highest education level: 12th grade  Occupational History   Occupation: Disability   Occupation: retired-daycare  Tobacco Use   Smoking status: Former    Current packs/day: 0.00    Average packs/day: 3.0 packs/day for 5.0 years (15.0 ttl pk-yrs)    Types: Cigarettes    Start date: 08/19/1963    Quit date: 08/18/1968    Years since quitting: 55.9    Passive exposure: Past   Smokeless tobacco: Never  Vaping Use   Vaping status: Never Used  Substance and Sexual Activity   Alcohol use: No    Alcohol/week: 0.0 standard drinks of alcohol   Drug use: Yes    Types: Marijuana    Comment: Medical Marijuana  Sexual activity: Not Currently    Partners: Male  Other Topics Concern   Not on file  Social History Narrative   Health Care POA:    Emergency Contact: Alyse Ruth [phone]   End of Life Plan:    Who lives with you: two grandchildren, friend, daughter   Any pets: 3 poodles   Diet: Pt has a variety of protein, starch, vegetables.  Pt is currently working on cutting back on portions for weight loss.   Exercise: Pt has not regular exercise routine.  Occasionally walks around home.   Seatbelts: Pt reports wearing seatbelt occasionally.   Sun Exposure/Protection: Pt does not use sun protection   Hobbies: reading, playing on kindle, ebay         Currently in her home she keeps her granddaughter Chuck Pounds, 16, who is the daughter of the patient's daughter Rilla (the patient refers to [name] as my adopted daughter); grandson Zarinah Oviatt, [place], who is [name]'s half-brother; daughter Butler, and an Algerian friend, Bonner Ruth Forbes, the patient's significant other. Daughter Butler is a Public Librarian, currently unemployed. Son Charlie Dillon Junior works as an personnel officer in [place]. Daughter Rilla is currently in prison due to killing someone in a car accident. Daughter [name] died from  aplastic anemia at the age of 57. The patient has a total of 4 grandchildren. She is not a church attender   Social Drivers of Health   Financial Resource Strain: High Risk (05/02/2023)   Overall Financial Resource Strain (CARDIA)    Difficulty of Paying Living Expenses: Very hard  Food Insecurity: Food Insecurity Present (11/17/2023)   Hunger Vital Sign    Worried About Running Out of Food in the Last Year: Often true    Ran Out of Food in the Last Year: Sometimes true  Transportation Needs: Unmet Transportation Needs (05/02/2023)   PRAPARE - Administrator, Civil Service (Medical): Yes    Lack of Transportation (Non-Medical): Yes  Physical Activity: Unknown (05/02/2023)   Exercise Vital Sign    Days of Exercise per Week: Patient declined    Minutes of Exercise per Session: Not on file  Stress: Stress Concern Present (05/02/2023)   [employer] of Occupational Health - Occupational Stress Questionnaire    Feeling of Stress : To some extent  Social Connections: Socially Isolated (05/02/2023)   Social Connection and Isolation Panel    Frequency of Communication with Friends and Family: Twice a week    Frequency of Social Gatherings with Friends and Family: Never    Attends Religious Services: Never    Database Administrator or Organizations: No    Attends Engineer, Structural: Not on file    Marital Status: Widowed  Intimate Partner Violence: Not At Risk (07/30/2022)   Humiliation, Afraid, Rape, and Kick questionnaire    Fear of Current or Ex-Partner: No    Emotionally Abused: No    Physically Abused: No    Sexually Abused: No     Family History:  The patient's family history includes Aplastic anemia in her daughter; Breast cancer (age of onset: 38) in her mother; Cancer (age of onset: 73) in her maternal aunt; Cancer (age of onset: 80) in her mother; Cancer (age of onset: 69) in her maternal grandmother; Cancer (age of onset: 39) in her paternal aunt; Coronary  artery disease (age of onset: 47) in her brother, father, and sister; Diabetes in her father; Heart disease in her father.    ROS:  ROS was otherwise negative  Physical Exam/Data: Vitals:   07/08/24 1721 07/08/24 1800 07/08/24 1828 07/08/24 1930  BP:    (!) 180/134  Pulse:  (!) 39 (!) 38 (!) 37  Resp:  20  19  Temp:    98.4 F (36.9 C)  TempSrc:    Oral  SpO2:  99% 98% 99%  Weight: 133.4 kg      No intake or output data in the 24 hours ending 07/08/24 2015    07/08/2024    5:21 PM 06/06/2024    2:38 PM 05/30/2024    2:01 PM  Last 3 Weights  Weight (lbs) 294 lb 294 lb 294 lb 14.4 oz  Weight (kg) 133.358 kg 133.358 kg 133.766 kg     Body mass index is 50.46 kg/m.  General:  Well nourished, well developed, in no acute distress HEENT: normal Neck: no JVD Vascular: No carotid bruits; Distal pulses 2+ bilaterally   Cardiac:  normal S1, S2; RRR; no murmur Lungs:  clear to auscultation bilaterally, no wheezing, rhonchi or rales  Abd: soft, nontender, no hepatomegaly  Ext: no edema Musculoskeletal:  No deformities, BUE and BLE strength normal and equal Skin: warm and dry  Neuro:  CNs 2-12 intact, no focal abnormalities noted Psych:  Normal affect   EKG:  The ECG demonstrates complete heart block A-V dissociation and ventricular rate 41.  There is right bundle branch block noted.    Laboratory Data: High Sensitivity Troponin:  No results for input(s): TROPONINIHS in the last 720 hours.    Chemistry Recent Labs  Lab 07/08/24 1813  NA 140  K 3.8  CL 103  CO2 28  GLUCOSE 190*  BUN 21  CREATININE 0.76  CALCIUM 9.3  GFRNONAA >60  ANIONGAP 10    Recent Labs  Lab 07/08/24 1813  PROT 7.0  ALBUMIN 3.5  AST 38  ALT 41  ALKPHOS 91  BILITOT 0.5   Lipids No results for input(s): CHOL, TRIG, HDL, LABVLDL, LDLCALC, CHOLHDL in the last 168 hours. Hematology Recent Labs  Lab 07/08/24 1813  WBC 6.6  RBC 4.30  HGB 12.6  HCT 39.0  MCV 90.7  MCH 29.3   MCHC 32.3  RDW 14.4  PLT 197   Thyroid No results for input(s): TSH, FREET4 in the last 168 hours. BNP Recent Labs  Lab 07/08/24 1813  PROBNP 366.0*    DDimer No results for input(s): DDIMER in the last 168 hours.  Radiology/Studies:  DG Chest Portable 1 View Result Date: 07/08/2024 CLINICAL DATA:  Chest pain EXAM: PORTABLE CHEST 1 VIEW COMPARISON:  CXR 05/03/2020 FINDINGS: Left chest port is stable in position. The heart is enlarged. The lungs are clear. No pneumothorax or pleural effusion. No acute fractures. IMPRESSION: No active disease. Cardiomegaly. Electronically Signed   By: Greig Pique M.D.   On: 07/08/2024 17:48     Assessment and Plan: This is a pleasant 73 year old woman with stage IV breast cancer with known mets to the lungs, presenting to [facility] CICU as a transfer for CHB.  Prior to the patient's arrival I had a discussion with the EP attending who plans to the patient tomorrow morning and likely proceed with permanent pacemaker.  # CHB - On 4 L of oxygen  - Pads placed in case we need transcutaneous pacing overnight - Atropine available on the floor - EP team aware of the patient, current plan is PPM on 07/09/2024 - Holding anticoagulation preparation for procedure - Patient is n.p.o. at  midnight  # DM2 - Insulin  glargine 10 units daily (normally on 20 units at home) - Sliding scale insulin   # Breast Cancer stage IV with mets to lungs # Cancer associated pain - Continue home letrozole  2.5 mg daily  Risk Assessment/Risk Scores:    Code Status: Full Code  Severity of Illness: The appropriate patient status for this patient is INPATIENT. Inpatient status is judged to be reasonable and necessary in order to provide the required intensity of service to ensure the patient's safety. The patient's presenting symptoms, physical exam findings, and initial radiographic and laboratory data in the context of their chronic comorbidities is felt to place  them at high risk for further clinical deterioration. Furthermore, it is not anticipated that the patient will be medically stable for discharge from the hospital within 2 midnights of admission.   * I certify that at the point of admission it is my clinical judgment that the patient will require inpatient hospital care spanning beyond 2 midnights from the point of admission due to high intensity of service, high risk for further deterioration and high frequency of surveillance required.*  For questions or updates, please contact [facility] Please consult [email or web address] for contact info under     Signed, Donley LOISE Devonshire, MD  07/08/2024 8:15 PM

## 2024-07-08 NOTE — Telephone Encounter (Signed)
 Patient returns call to nurse line. She states that she strongly feels that her sleep apnea is contributing to her lowered HR and O2 saturations. Reports that at 0200 this morning, HR was still in the 30's. Sp02 was in the low 90's.   She would like to receive oral medication for sleep apnea. She has heard about this on TV.   She reiterates that she does not want to come into the office due to sick season.   Patient would likely benefit from pulmonology referral, given sleep apnea and POC needs.   Will forward to provider to see if referral can be placed to pulmonology without office visit.   Patient also reports that she plans to keep her follow up visit with cardiology next week.   Chiquita JAYSON English, RN

## 2024-07-09 ENCOUNTER — Ambulatory Visit (HOSPITAL_COMMUNITY)
Admission: EM | Disposition: A | Discharge status: Home / Self Care | Payer: Self-pay | Source: Home / Self Care | Attending: Emergency Medicine

## 2024-07-09 ENCOUNTER — Encounter (HOSPITAL_COMMUNITY): Payer: Self-pay | Admitting: Student in an Organized Health Care Education/Training Program

## 2024-07-09 ENCOUNTER — Observation Stay (HOSPITAL_COMMUNITY)

## 2024-07-09 DIAGNOSIS — I10 Essential (primary) hypertension: Secondary | ICD-10-CM | POA: Diagnosis not present

## 2024-07-09 DIAGNOSIS — R079 Chest pain, unspecified: Secondary | ICD-10-CM | POA: Diagnosis not present

## 2024-07-09 DIAGNOSIS — I442 Atrioventricular block, complete: Principal | ICD-10-CM

## 2024-07-09 DIAGNOSIS — E119 Type 2 diabetes mellitus without complications: Secondary | ICD-10-CM | POA: Diagnosis not present

## 2024-07-09 HISTORY — PX: PACEMAKER IMPLANT: EP1218

## 2024-07-09 LAB — GLUCOSE, CAPILLARY
Glucose-Capillary: 106 mg/dL — ABNORMAL HIGH (ref 70–99)
Glucose-Capillary: 136 mg/dL — ABNORMAL HIGH (ref 70–99)
Glucose-Capillary: 138 mg/dL — ABNORMAL HIGH (ref 70–99)
Glucose-Capillary: 151 mg/dL — ABNORMAL HIGH (ref 70–99)
Glucose-Capillary: 180 mg/dL — ABNORMAL HIGH (ref 70–99)

## 2024-07-09 LAB — CBC
HCT: 37.1 % (ref 36.0–46.0)
Hemoglobin: 12.1 g/dL (ref 12.0–15.0)
MCH: 28.6 pg (ref 26.0–34.0)
MCHC: 32.6 g/dL (ref 30.0–36.0)
MCV: 87.7 fL (ref 80.0–100.0)
Platelets: 192 K/uL (ref 150–400)
RBC: 4.23 MIL/uL (ref 3.87–5.11)
RDW: 14.4 % (ref 11.5–15.5)
WBC: 7.2 K/uL (ref 4.0–10.5)
nRBC: 0 % (ref 0.0–0.2)

## 2024-07-09 LAB — MAGNESIUM: Magnesium: 2.1 mg/dL (ref 1.7–2.4)

## 2024-07-09 LAB — BASIC METABOLIC PANEL WITH GFR
Anion gap: 16 — ABNORMAL HIGH (ref 5–15)
BUN: 19 mg/dL (ref 8–23)
CO2: 22 mmol/L (ref 22–32)
Calcium: 9.1 mg/dL (ref 8.9–10.3)
Chloride: 105 mmol/L (ref 98–111)
Creatinine, Ser: 0.8 mg/dL (ref 0.44–1.00)
GFR, Estimated: >60 mL/min (ref >60)
Glucose, Bld: 126 mg/dL — ABNORMAL HIGH (ref 70–99)
Potassium: 4.4 mmol/L (ref 3.5–5.1)
Sodium: 143 mmol/L (ref 135–145)

## 2024-07-09 LAB — SURGICAL PCR SCREEN
MRSA, PCR: NEGATIVE
Staphylococcus aureus: POSITIVE — AB

## 2024-07-09 LAB — TROPONIN I (HIGH SENSITIVITY): Troponin I (High Sensitivity): 25 ng/L — ABNORMAL HIGH (ref <18)

## 2024-07-09 LAB — ABO/RH: ABO/RH(D): A POS

## 2024-07-09 SURGERY — PACEMAKER IMPLANT
Anesthesia: LOCAL

## 2024-07-09 MED ORDER — SODIUM CHLORIDE 0.9 % IV SOLN: INTRAVENOUS | Status: DC

## 2024-07-09 MED ORDER — LIDOCAINE HCL (PF) 1 % IJ SOLN
INTRAMUSCULAR | Status: AC
  Filled 2024-07-09: qty 60

## 2024-07-09 MED ORDER — VANCOMYCIN HCL 2000 MG/400ML IV SOLN
2000.0000 mg | INTRAVENOUS | Status: AC
  Administered 2024-07-09: 2000 mg via INTRAVENOUS
  Filled 2024-07-09: qty 400

## 2024-07-09 MED ORDER — FENTANYL CITRATE (PF) 100 MCG/2ML IJ SOLN
INTRAMUSCULAR | Status: DC | PRN
  Administered 2024-07-09 (×2): 25 ug via INTRAVENOUS

## 2024-07-09 MED ORDER — ONDANSETRON HCL 4 MG/2ML IJ SOLN
INTRAMUSCULAR | Status: AC
Start: 2024-07-09 — End: 2024-07-09
  Filled 2024-07-09: qty 2

## 2024-07-09 MED ORDER — LIDOCAINE HCL (PF) 1 % IJ SOLN
INTRAMUSCULAR | Status: DC | PRN
  Administered 2024-07-09: 70 mL

## 2024-07-09 MED ORDER — ONDANSETRON HCL 4 MG/2ML IJ SOLN
INTRAMUSCULAR | Status: DC | PRN
  Administered 2024-07-09: 4 mg via INTRAVENOUS

## 2024-07-09 MED ORDER — FENTANYL CITRATE (PF) 100 MCG/2ML IJ SOLN
INTRAMUSCULAR | Status: AC
  Filled 2024-07-09: qty 2

## 2024-07-09 MED ORDER — VANCOMYCIN HCL 1500 MG/300ML IV SOLN: 1500.0000 mg | INTRAVENOUS | Status: DC

## 2024-07-09 MED ORDER — MIDAZOLAM HCL 5 MG/5ML IJ SOLN
INTRAMUSCULAR | Status: DC | PRN
  Administered 2024-07-09: 1 mg via INTRAVENOUS

## 2024-07-09 MED ORDER — LIDOCAINE HCL (PF) 1 % IJ SOLN
INTRAMUSCULAR | Status: AC
  Filled 2024-07-09: qty 30

## 2024-07-09 MED ORDER — HEPARIN (PORCINE) IN NACL 1000-0.9 UT/500ML-% IV SOLN
INTRAVENOUS | Status: DC | PRN
Start: 2024-07-09 — End: 2024-07-09
  Administered 2024-07-09: 500 mL

## 2024-07-09 MED ORDER — MUPIROCIN 2 % EX OINT
TOPICAL_OINTMENT | Freq: Once | CUTANEOUS | Status: AC
  Filled 2024-07-09: qty 22

## 2024-07-09 MED ORDER — SODIUM CHLORIDE 0.9 % IV SOLN
INTRAVENOUS | Status: AC
  Filled 2024-07-09: qty 2

## 2024-07-09 MED ORDER — HEPARIN SOD (PORK) LOCK FLUSH 100 UNIT/ML IV SOLN
500.0000 [IU] | INTRAVENOUS | Status: AC | PRN
  Administered 2024-07-09: 500 [IU]

## 2024-07-09 MED ORDER — IBUPROFEN 800 MG PO TABS: 800.0000 mg | ORAL_TABLET | Freq: Three times a day (TID) | ORAL | Status: AC

## 2024-07-09 MED ORDER — MUPIROCIN 2 % EX OINT: 1.0000 | TOPICAL_OINTMENT | Freq: Two times a day (BID) | CUTANEOUS | Status: DC

## 2024-07-09 MED ORDER — MIDAZOLAM HCL 2 MG/2ML IJ SOLN
INTRAMUSCULAR | Status: AC
Start: 2024-07-09 — End: 2024-07-09
  Filled 2024-07-09: qty 2

## 2024-07-09 MED ORDER — SODIUM CHLORIDE 0.9 % IV SOLN
80.0000 mg | INTRAVENOUS | Status: AC
  Administered 2024-07-09: 80 mg
  Filled 2024-07-09: qty 2

## 2024-07-09 SURGICAL SUPPLY — 15 items
CABLE SURGICAL S-101-97-12 (CABLE) ×2 IMPLANT
CATH CPS LOCATOR 3D LRG XLNG (CATHETERS) IMPLANT
CATH CPS LOCATOR 3D MED (CATHETERS) IMPLANT
GUIDEWIRE VASC J-TIP .035X150 (WIRE) IMPLANT
LEAD ULTIPACE 58 LPA1231/58 (Lead) IMPLANT
LEAD ULTIPACE 65 LPA1231/65 (Lead) IMPLANT
PACEMAKER ASSURITY DR-RF (Pacemaker) IMPLANT
PAD DEFIB RADIO PHYSIO CONN (PAD) ×2 IMPLANT
SHEATH 7FR PRELUDE SNAP 13 (SHEATH) IMPLANT
SHEATH 7FR PRELUDE SNAP 25 (SHEATH) IMPLANT
SHEATH 9FR PRELUDE SNAP 13 (SHEATH) IMPLANT
SHEATH PROBE COVER 6X72 (BAG) IMPLANT
SLITTER AGILIS HISPRO (INSTRUMENTS) IMPLANT
TOOL HELIX LOCKING (MISCELLANEOUS) IMPLANT
TRAY PACEMAKER INSERTION (PACKS) ×2 IMPLANT

## 2024-07-09 NOTE — Op Note (Signed)
 Procedure:  LEFT sided Abbott Dual Chamber PPM Implant  Pre-op  Diagnosis: CHB    Post-op Diagnosis: same  Procedure Date: 07/09/24  Attending: Adina Primus, MD   Anesthesia: MAC  Procedure Report:  The LEFT shoulder was prepped with chlorhexidine  scrub. 70 cc lidocaine  was injected at the planned incision site. A pocket was created with electrocautery and blunt dissection.    No venogram was performed. The LEFT axillary vein was accessed using standard access needle under ultrasound guidance. A wire was passed into the IVC. The access process was repeated once and a second wire was advanced into the IVC.  A 7 Fr sheath was placed over the second wire. The RA lead was subsequently advanced over a curved stylet to the RV for temporary backup pacing.   A 9 Fr sheath was advanced and the dilator removed. Through this short sheath, an Abbott Lg curve LbaP sheath was advanced to the basal RV septum.   The RV lead was inserted into the sheath and the lead was then advanced to the basal RV septum.   The site for deployment was identified based on RAO and LAO fluoroscopy and response to pacing. With RV endocardial pacing, we observed a W pattern in V1. The helix was extended and the UltiPace lead was advanced into the RV septum with several clockwise turns..   The paced morphology demonstrated qR in V1.  An isoelectric segment was present.  The above findings were most consistent with selective LB capture  The sheath was split and removed and the RV lead was firmly attached to the pre-pectoralis fascia with three 0-silk sutures.   The RA lead was subsequently withdrawn from the RV and advanced into the RAA. A curved stylet was then used to affix the RA lead in the right atrial appendage. The active fixation screw was deployed, and interrogation revealed a current of injury with acceptable lead parameters (see below). The peel away sheath was removed and the lead was affixed to the fascia  using two 0-silk sutures.   The device was then connected to the leads. The pocket was irrigated with gentamicin  solution. The device was affixed to the underlying fascia with a single 0-silk suture. The pocket was closed with continuous 2-0 Vicryl, 2-0 V-Loc and 3-0 Stratafix. Dermabond was used to close the superficial layer. A vaso-occlusive dressing was placed.    The device was evaluated by the representative of the cardiac device manufacturer under the supervision of the attending physician with implant parameters listed below.   Complications: none Estimated blood loss: less than 50 mL  Implanted Hardware: Implant Name Type Inv. Item Serial No. Manufacturer Lot No. LRB No. Used Action  LEAD ULTIPACE 58 OEJ8768/41 - [id number] Lead LEAD ULTIPACE 58 [id number]/58 [id number] [employer]  N/A 1 Implanted  LEAD ULTIPACE 65 LPA1231/65 - [id number] Lead LEAD ULTIPACE 65 [id number]/65 [id number] [employer]  N/A 1 Implanted  PACEMAKER ASSURITY DR-RF - [id number] Pacemaker PACEMAKER ASSURITY DR-RF [id number] [employer]  N/A 1 Implanted   Device Information: PPM: Abbott Assurity MRI 2272, SN [id number], DOI 07/09/24 RA:  Dail Lowes OEJ8768, SN [id number], DOI 07/09/24 RV/LBaP: Dail Lowes [id number], SN [id number], DOI 07/09/24  Lead Interrogation Data: RA: 2.3 mV / 468 ohms / 0.6 V @ 0.5 ms RV/LBaP: 23.3 mV / 658 ohms / 1.9 V @ 0.5 ms  Summary: 1. Successful implant of a LEFT sided Abbott DC/LBBAP PPM with selective LB capture  Recommendations: Routine post-procedure care with bedrest for  3 hours No heparin  (IV or subcutaneous) for 48 hours. No enoxaparin  (IV or subcutaneous) for 7 days.  PA/lateral CXR post procedure  Wound check prior to discharge Device interrogation post CXR Possible same day discharge if no acute complications   [name], MD [facility]  Cardiac Electrophysiology

## 2024-07-09 NOTE — Discharge Summary (Signed)
 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 Successful implant of a LEFT sided Abbott DC/LBBAP PPM with selective LB capture   Recommendations: 1.Routine post-procedure care with bedrest for 3 hours 2.         No heparin  (IV or subcutaneous) for 48 hours. No enoxaparin  (IV or subcutaneous) for 7 days.  3.         PA/lateral CXR post procedure  4.Wound check prior to discharge 5.Device interrogation post CXR 6.         Possible same day discharge if no acute complications  _____________   History of Present Illness   [name] is a 73 y.o. 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   [name] was evaluated by Dr. Zenaida on 06/06/2024 in the cardio oncology clinic for monitoring of cardiotoxicity while undergoing treatment for metastatic breast cancer.  She did report intermittent chest discomfort then along with significant chronic back pain.  The pain was so severe that she felt sometimes she had syncope related to pain.  Her main concern at that time was weakness and her goals of care were living until her grandson graduated from high school.  She was not interested in invasive testing to determine whether she had  obstructive coronary disease and she wanted to manage her symptoms with medications if possible to avoid invasive testing.  She was started on a low-dose of bisoprolol  2.5 mg daily but otherwise no medication changes.   Over the past several weeks she noticed an acute change in her memory along with worsening fatigue and generalized malaise.  She thought that it may have been related to her blood pressure so she ordered a new blood pressure cuff as her heart rate was in the 30s with what she had.  After receiving her new blood pressure cuff it said the same thing that her heart rate was stuck in the 30s at which time she presented to the ED as she felt worsening dyspnea and generalized fatigue.  She has had chest pain both at rest and exertion however this does not seem to be significantly different than it was when she saw Dr. Zenaida last month.   On evaluation emergency department she was hypertensive with BP 188/56, bradycardic with heart rate 30s and on her normal 4L [place].  Lab work was overall unremarkable other than an elevated proBNP of 366. hsT with minimal elevation but overall flat trend (28->25).    During my assessment this morning she was sitting comfortably in a chair although reporting some back pain.  Heart rate remains in the 30s.  Her family helps her to take her medications.   Hospital Course   Consultants: none  Complete heart block Tolerated PPM  insertion well. Follow up CXR negative for PTX.  BB was held prior to admission, will remove from med list.    IDDM Breast cancer Chronic respiratory failure on home O2 Cognitive decline - continue home medications and home O2 - on baseline oxygen    Pt seen and examined by Dr. Almetta and deemed stable for discharge. Device interrogated with appropriate parameters.  Holding AV nodal agents.        Did the patient have an acute coronary syndrome (MI, NSTEMI, STEMI, etc) this admission?:  No                               Did  the patient have a percutaneous coronary intervention (stent / angioplasty)?:  No.        The patient will be scheduled for a TOC follow up appointment in 7-14 days.  A message has been sent to the [facility] and Scheduling Pool at the office where the patient should be seen for follow up.  _____________  Discharge Vitals Blood pressure 106/80, pulse 61, temperature 97.9 F (36.6 C), temperature source Axillary, resp. rate (!) 21, height 5' 4 (1.626 m), weight (!) 136.1 kg, SpO2 98%.  Filed Weights   07/08/24 1721 07/09/24 0429  Weight: 133.4 kg (!) 136.1 kg    Labs & Radiologic Studies  CBC Recent Labs    07/08/24 1813 07/08/24 2232 07/09/24 0051  WBC 6.6 6.5 7.2  NEUTROABS 4.5 4.4  --   HGB 12.6 12.2 12.1  HCT 39.0 37.3 37.1  MCV 90.7 88.4 87.7  PLT 197 197 192   Basic Metabolic Panel Recent Labs    88/78/74 2232 07/09/24 0051  NA 141 143  K 3.9 4.4  CL 101 105  CO2 29 22  GLUCOSE 149* 126*  BUN 19 19  CREATININE 0.83 0.80  CALCIUM 9.0 9.1  MG 1.9 2.1   Liver Function Tests Recent Labs    07/08/24 1813 07/08/24 2232  AST 38 34  ALT 41 38  ALKPHOS 91 73  BILITOT 0.5 0.9  PROT 7.0 6.5  ALBUMIN 3.5 2.9*   No results for input(s): LIPASE, AMYLASE in the last 72 hours. High Sensitivity Troponin:   Recent Labs  Lab 07/08/24 2232 07/09/24 0051  TROPONINIHS 28* 25*    Recent Labs  Lab 07/08/24 1813  TRNPT 17    BNP Invalid input(s): POCBNP Recent Labs    07/08/24 1813  PROBNP 366.0*    No results for input(s): BNP in the last 72 hours.  D-Dimer No results for input(s): DDIMER in the last 72 hours. Hemoglobin A1C No results for input(s): HGBA1C in the last 72 hours. Fasting Lipid Panel No results for input(s): CHOL, HDL, LDLCALC, TRIG, CHOLHDL, LDLDIRECT in the last 72 hours. No results found for: LIPOA  Thyroid Function Tests No results for input(s): TSH, T4TOTAL, T3FREE, THYROIDAB in the last 72  hours.  Invalid input(s): FREET3 _____________  DG Chest 2 View Result Date: 07/09/2024 EXAM: 2 VIEW(S) XRAY OF THE CHEST 07/09/2024 01:09:00 PM COMPARISON: 07/08/2024 CLINICAL HISTORY: Pacemaker. FINDINGS: LINES, TUBES AND DEVICES: Indwelling power port type central venous catheter with tip over the cavoatrial junction region. LUNGS AND PLEURA: Shallow inspiration. Pneumothorax. No focal pulmonary opacity. No pleural effusion. HEART AND MEDIASTINUM: Interval placement of a cardiac pacemaker with generator pack on the left. Lead tips project over the RA and RV regions. Mild cardiac enlargement. BONES AND SOFT TISSUES: No  acute osseous abnormality. IMPRESSION: 1. Interval placement of a cardiac pacemaker with generator pack on the left, with lead tips over the RA and RV regions. 2. No pneumothorax identified. 3. Indwelling power port type central venous catheter with tip over the cavoatrial junction region. 4. Mild cardiomegaly. Electronically signed by: Elsie Gravely MD 07/09/2024 01:34 PM EST RP Workstation: [id number]   EP PPM/ICD IMPLANT Result Date: 07/09/2024 Summary: 1. Successful implant of a LEFT sided Abbott DC/LBBAP PPM with selective LB capture Recommendations: 1. Routine post-procedure care with bedrest for 3 hours 2. No heparin  (IV or subcutaneous) for 48 hours. No enoxaparin  (IV or subcutaneous) for 7 days. 3. PA/lateral CXR post procedure 4. Wound check prior to discharge 5. Device interrogation post CXR 6. Possible same day discharge if no acute complications [name], MD [facility] Cardiac Electrophysiology    DG Chest Portable 1 View Result Date: 07/08/2024 CLINICAL DATA:  Chest pain EXAM: PORTABLE CHEST 1 VIEW COMPARISON:  CXR 05/03/2020 FINDINGS: Left chest port is stable in position. The heart is enlarged. The lungs are clear. No pneumothorax or pleural effusion. No acute fractures. IMPRESSION: No active disease. Cardiomegaly. Electronically Signed   By:  Greig Pique M.D.   On: 07/08/2024 17:48    Disposition Pt is being discharged home today in good condition.  Follow-up Plans & Appointments    Discharge Medications Allergies as of 07/09/2024       Reactions   Meperidine Hcl Anaphylaxis   Penicillins Anaphylaxis   Aspirin  Nausea And Vomiting        Medication List     STOP taking these medications    bisoprolol  5 MG tablet Commonly known as: ZEBETA        TAKE these medications    acetaminophen  500 MG tablet Commonly known as: TYLENOL  Take 500 mg by mouth every 6 (six) hours as needed for moderate pain (pain score 4-6).   albuterol  108 (90 Base) MCG/ACT inhaler Commonly known as: VENTOLIN  HFA INHALE 2 PUFFS INTO THE LUNGS EVERY 6 HOURS AS NEEDED FOR WHEEZE   diazepam  5 MG tablet Commonly known as: VALIUM  Take 5 mg by mouth 2 (two) times daily as needed for anxiety.   insulin  lispro 100 UNIT/ML injection Commonly known as: HUMALOG  Inject 8-10 Units into the skin 3 (three) times daily before meals.   Lantus  SoloStar 100 UNIT/ML Solostar Pen Generic drug: insulin  glargine Inject 20 Units into the skin daily. What changed: how much to take   letrozole  2.5 MG tablet Commonly known as: FEMARA  TAKE 1 TABLET BY MOUTH EVERY DAY   loperamide  2 MG tablet Commonly known as: IMODIUM  A-D Take 2 mg by mouth daily as needed for diarrhea or loose stools.   nitroGLYCERIN  0.4 MG SL tablet Commonly known as: NITROSTAT  Place 0.4 mg under the tongue every 5 (five) minutes as needed for chest pain.   ondansetron  8 MG tablet Commonly known as: ZOFRAN  Take 8 mg by mouth every 8 (eight) hours as needed for nausea or vomiting.   Ozempic  (2 MG/DOSE) 8 MG/3ML Sopn Generic drug: Semaglutide  (2 MG/DOSE) Inject 2 mg into the skin every 7 (seven) days.         Outstanding Labs/Studies   Duration of Discharge Encounter: APP Time: 25 minutes   Signed, Jon Nat Hails, PA 07/09/2024, 2:02 PM

## 2024-07-09 NOTE — Plan of Care (Signed)
  Problem: Coping: Goal: Level of anxiety will decrease Outcome: Progressing   Problem: Elimination: Goal: Will not experience complications related to bowel motility Outcome: Progressing Goal: Will not experience complications related to urinary retention Outcome: Progressing   Problem: Safety: Goal: Ability to remain free from injury will improve Outcome: Progressing   Problem: Coping: Goal: Ability to adjust to condition or change in health will improve Outcome: Progressing   Problem: Metabolic: Goal: Ability to maintain appropriate glucose levels will improve Outcome: Progressing   Problem: Skin Integrity: Goal: Risk for impaired skin integrity will decrease Outcome: Progressing   Problem: Tissue Perfusion: Goal: Adequacy of tissue perfusion will improve Outcome: Progressing

## 2024-07-09 NOTE — Care Management CC44 (Signed)
 Condition Code 44 Documentation Completed  Patient Details  Name: LANIE SCHELLING MRN: [id number] Date of Birth: [birth year]   Condition Code 44 given:  Yes Patient signature on Condition Code 44 notice:  Yes Documentation of 2 MD's agreement:  Yes Code 44 added to claim:  Yes    Marval Gell, RN 07/09/2024, 1:35 PM

## 2024-07-09 NOTE — H&P (View-Only) (Signed)
 Cardiology Consultation:   Patient ID: [name] MRN: [id number]; DOB: 11/12/1950  Admit date: 07/08/2024 Date of Consult: 07/09/2024  Primary Care Provider: [name], MD [facility] Cardiologist: Odis Brownie, MD  [facility] Electrophysiologist: Adina Primus, MD   Patient Profile:   [name] is a 73 y.o. 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  History of Present Illness:   [name] was evaluated by Dr. Brownie on 06/06/2024 in the cardio oncology clinic for monitoring of cardiotoxicity while undergoing treatment for metastatic breast cancer.  She did report intermittent chest discomfort then along with significant chronic back pain.  The pain was so severe that she felt sometimes she had syncope related to pain.  Her main concern at that time was weakness and her goals of care were living until her grandson graduated from high school.  She was not interested in invasive testing to determine whether she had obstructive coronary disease and she wanted to manage her symptoms with medications if possible to avoid invasive testing.  She was started on a low-dose of bisoprolol  2.5 mg daily but otherwise no medication changes.  Over the past several weeks she noticed an acute change in her memory along with worsening fatigue and generalized malaise.  She thought that it may have been related to her blood pressure so she ordered a new blood pressure cuff as her heart rate was in the 30s with what she had.  After receiving her new blood pressure cuff it said the same thing that her heart rate was stuck in the 30s at which time she presented to the ED as she felt worsening dyspnea and generalized fatigue.  She has had chest pain both at rest and exertion however this does  not seem to be significantly different than it was when she saw Dr. Brownie last month.  On evaluation emergency department she was hypertensive with BP 188/56, bradycardic with heart rate 30s and on her normal 4L [place].  Lab work was overall unremarkable other than an elevated proBNP of 366. hsT with minimal elevation but overall flat trend (28->25).   During my assessment this morning she was sitting comfortably in a chair although reporting some back pain.  Heart rate remains in the 30s.  Her family helps her to take her medications.  Past Medical History:  Diagnosis Date   Arthritis    Back pain    Breast cancer (HCC) dx'd 11/2012   left   Chest pain    COPD (chronic obstructive pulmonary disease) (HCC)    Diabetes mellitus without complication (HCC) 09/28/2014   Ear pain    Fatty liver 6/03   Hypertension    Lung disease    Lung metastases dx'd 11/2012   Obesity, unspecified    Other abnormal glucose    Suicide attempt (HCC) 1996   Syncope and collapse    Unspecified sleep apnea    Past Surgical History:  Procedure Laterality Date   CARDIAC CATHETERIZATION     2007   CHOLECYSTECTOMY     TUBAL LIGATION       Home Medications:  Prior to Admission medications   Medication Sig Start Date End Date Taking? Authorizing Provider  albuterol  (VENTOLIN  HFA) 108 (90 Base) MCG/ACT inhaler INHALE 2 PUFFS INTO THE LUNGS EVERY  6 HOURS AS NEEDED FOR WHEEZE 11/15/23   [name], MD  insulin  glargine (LANTUS  SOLOSTAR) 100 UNIT/ML Solostar Pen Inject 20 Units into the skin daily. 04/11/24   Donzetta Rollene BRAVO, MD  letrozole  (FEMARA ) 2.5 MG tablet TAKE 1 TABLET BY MOUTH EVERY DAY 05/12/23   Iruku, Praveena, MD    Inpatient Medications: Scheduled Meds:  Chlorhexidine  Gluconate Cloth  6 each Topical Daily   insulin  aspart  0-15 Units Subcutaneous Q4H   insulin  glargine-yfgn  10 Units Subcutaneous Daily   letrozole   2.5 mg Oral Daily   sodium chloride  flush  10-40 mL Intracatheter Q12H    Continuous Infusions:  PRN Meds: albuterol , docusate sodium , ondansetron  (ZOFRAN ) IV, mouth rinse, polyethylene glycol, sodium chloride  flush  Allergies:    Allergies  Allergen Reactions   Meperidine Hcl Anaphylaxis   Penicillins Anaphylaxis    Has patient had a PCN reaction causing immediate rash, facial/tongue/throat swelling, SOB or lightheadedness with hypotension: yes Has patient had a PCN reaction causing severe rash involving mucus membranes or skin necrosis: no Has patient had a PCN reaction that required hospitalization yes Has patient had a PCN reaction occurring within the last 10 years: no If all of the above answers are NO, then may proceed with Cephalosporin use.    Aspirin  Nausea And Vomiting    Social History:   Social History   Socioeconomic History   Marital status: Divorced    Spouse name: Not on file   Number of children: 4   Years of education: 12   Highest education level: 12th grade  Occupational History   Occupation: Disability   Occupation: retired-daycare  Tobacco Use   Smoking status: Former    Current packs/day: 0.00    Average packs/day: 3.0 packs/day for 5.0 years (15.0 ttl pk-yrs)    Types: Cigarettes    Start date: 08/19/1963    Quit date: 08/18/1968    Years since quitting: 55.9    Passive exposure: Past   Smokeless tobacco: Never  Vaping Use   Vaping status: Never Used  Substance and Sexual Activity   Alcohol use: No    Alcohol/week: 0.0 standard drinks of alcohol   Drug use: Yes    Types: Marijuana    Comment: Medical Marijuana   Sexual activity: Not Currently    Partners: Male  Other Topics Concern   Not on file  Social History Narrative   Health Care POA:    Emergency Contact: Alyse Ruth [phone]   End of Life Plan:    Who lives with you: two grandchildren, friend, daughter   Any pets: 3 poodles   Diet: Pt has a variety of protein, starch, vegetables.  Pt is currently working on cutting back on portions for weight  loss.   Exercise: Pt has not regular exercise routine.  Occasionally walks around home.   Seatbelts: Pt reports wearing seatbelt occasionally.   Sun Exposure/Protection: Pt does not use sun protection   Hobbies: reading, playing on kindle, ebay         Currently in her home she keeps her granddaughter Chuck Pounds, 16, who is the daughter of the patient's daughter Rilla (the patient refers to [name] as my adopted daughter); grandson Brunilda Eble, [place], who is [name]'s half-brother; daughter Butler, and an [place]ian friend, Bonner Ruth Forbes, the patient's significant other. Daughter Butler is a Public Librarian, currently unemployed. Son Charlie Dillon Junior works as an personnel officer in [place]. Daughter Rilla is currently in prison due to killing someone in  a car accident. Daughter [name] died from aplastic anemia at the age of 28. The patient has a total of 4 grandchildren. She is not a church attender   Social Drivers of Health   Financial Resource Strain: High Risk (05/02/2023)   Overall Financial Resource Strain (CARDIA)    Difficulty of Paying Living Expenses: Very hard  Food Insecurity: Food Insecurity Present (07/08/2024)   Hunger Vital Sign    Worried About Running Out of Food in the Last Year: Often true    Ran Out of Food in the Last Year: Often true  Transportation Needs: Unmet Transportation Needs (07/08/2024)   PRAPARE - Administrator, Civil Service (Medical): Yes    Lack of Transportation (Non-Medical): Yes  Physical Activity: Unknown (05/02/2023)   Exercise Vital Sign    Days of Exercise per Week: Patient declined    Minutes of Exercise per Session: Not on file  Stress: Stress Concern Present (05/02/2023)   [employer] of Occupational Health - Occupational Stress Questionnaire    Feeling of Stress : To some extent  Social Connections: Moderately Isolated (07/08/2024)   Social Connection and Isolation Panel    Frequency of  Communication with Friends and Family: More than three times a week    Frequency of Social Gatherings with Friends and Family: Never    Attends Religious Services: Never    Database Administrator or Organizations: No    Attends Banker Meetings: Never    Marital Status: Living with partner  Intimate Partner Violence: Not At Risk (07/08/2024)   Humiliation, Afraid, Rape, and Kick questionnaire    Fear of Current or Ex-Partner: No    Emotionally Abused: No    Physically Abused: No    Sexually Abused: No    Family History:    Family History  Problem Relation Age of Onset   Coronary artery disease Father 46   Diabetes Father    Heart disease Father    Breast cancer Mother 36   Cancer Mother 30       breast   Aplastic anemia Daughter        died at age 20   Cancer Maternal Aunt 1       ovarian   Cancer Maternal Grandmother 50       ovarian   Cancer Paternal Aunt 53       ovarian/breast/breast   Coronary artery disease Sister 15   Coronary artery disease Brother 50    ROS:  Review of Systems: [y] = yes, [ ]  = no      General: Weight gain [ ] ; Weight loss [ ] ; Anorexia [ ] ; Fatigue [ ] ; Fever [ ] ; Chills [ ] ; Weakness [ ]    Cardiac: Chest pain/pressure [y]; Resting SOB [y]; Exertional SOB [y]; Orthopnea [ ] ; Pedal Edema [ ] ; Palpitations [ ] ; Syncope [ ] ; Presyncope [ ] ; Paroxysmal nocturnal dyspnea [ ]    Pulmonary: Cough [ ] ; Wheezing [ ] ; Hemoptysis [ ] ; Sputum [ ] ; Snoring [ ]    GI: Vomiting [ ] ; Dysphagia [ ] ; Melena [ ] ; Hematochezia [ ] ; Heartburn [ ] ; Abdominal pain [ ] ; Constipation [ ] ; Diarrhea [ ] ; BRBPR [ ]    GU: Hematuria [ ] ; Dysuria [ ] ; Nocturia [ ]  Vascular: Pain in legs with walking [ ] ; Pain in feet with lying flat [ ] ; Non-healing sores [ ] ; Stroke [ ] ; TIA [ ] ; Slurred speech [ ] ;   Neuro: Headaches [ ] ; Vertigo [ ] ;  Seizures [ ] ; Paresthesias [ ] ;Blurred vision [ ] ; Diplopia [ ] ; Vision changes [ ]    Ortho/Skin: Arthritis [ ] ; Joint pain [ ] ;  Muscle pain [ ] ; Joint swelling [ ] ; Back Pain [ ] ; Rash [ ]    Psych: Depression [ ] ; Anxiety [ ]    Heme: Bleeding problems [ ] ; Clotting disorders [ ] ; Anemia [ ]    Endocrine: Diabetes [ ] ; Thyroid dysfunction [ ]    Physical Exam/Data:   Vitals:   07/09/24 0500 07/09/24 0530 07/09/24 0600 07/09/24 0630  BP: (!) 151/45 (!) 140/51 (!) 146/54 (!) 145/51  Pulse: (!) 31 (!) 32 (!) 31 (!) 31  Resp: 19 11 18 15   Temp:      TempSrc:      SpO2: 100% 100% 100% 100%  Weight:       No intake or output data in the 24 hours ending 07/09/24 0750    07/09/2024    4:29 AM 07/08/2024    5:21 PM 06/06/2024    2:38 PM  Last 3 Weights  Weight (lbs) 300 lb 1.6 oz 294 lb 294 lb  Weight (kg) 136.124 kg 133.358 kg 133.358 kg     Body mass index is 51.51 kg/m.  General:  Well nourished, well developed, in no acute distress Cardiac:  normal S1, S2; slow rate, regular rhythm; no murmur  Lungs:  clear to auscultation bilaterally, no wheezing, rhonchi or rales  Abd: soft, nontender, no hepatomegaly   EKG:  The EKG (07/08/24, 22:06:48) was personally reviewed and demonstrates: CHB/VE 36, QRS 146, QT/c 540/417  Telemetry:  Telemetry was personally reviewed and demonstrates: VE 30s, CHB  Relevant CV Studies:  TTE Result date: 01/13/24  1. Left ventricular ejection fraction, by estimation, is 65%. Left  ventricular ejection fraction by 3D volume is 61 %. Left ventricular  ejection fraction by 2D MOD biplane is 65.1 %. The left ventricle has  normal function. The left ventricle has no  regional wall motion abnormalities. There is mild concentric left  ventricular hypertrophy. Left ventricular diastolic parameters are  consistent with Grade I diastolic dysfunction (impaired relaxation). The  average left ventricular global longitudinal  strain is -21.4 %. The global longitudinal strain is normal.   2. Right ventricular systolic function is normal. The right ventricular  size is not well visualized.  Tricuspid regurgitation signal is inadequate  for assessing PA pressure.   3. The mitral valve is normal in structure. No evidence of mitral valve  regurgitation. No evidence of mitral stenosis.   4. The aortic valve is tricuspid. Aortic valve regurgitation is not  visualized. No aortic stenosis is present.   5. There is dilatation of the ascending aorta, measuring 41 mm.   6. The inferior vena cava is normal in size with greater than 50%  respiratory variability, suggesting right atrial pressure of 3 mmHg.   Laboratory Data:  High Sensitivity Troponin:   Recent Labs  Lab 07/08/24 2232 07/09/24 0051  TROPONINIHS 28* 25*     Chemistry Recent Labs  Lab 07/08/24 1813 07/08/24 2232 07/09/24 0051  NA 140 141 143  K 3.8 3.9 4.4  CL 103 101 105  CO2 28 29 22   GLUCOSE 190* 149* 126*  BUN 21 19 19   CREATININE 0.76 0.83 0.80  CALCIUM 9.3 9.0 9.1  GFRNONAA >60 >60 >60  ANIONGAP 10 11 16*    Recent Labs  Lab 07/08/24 1813 07/08/24 2232  PROT 7.0 6.5  ALBUMIN 3.5 2.9*  AST 38 34  ALT 41 38  ALKPHOS 91 73  BILITOT 0.5 0.9   Hematology Recent Labs  Lab 07/08/24 1813 07/08/24 2232 07/09/24 0051  WBC 6.6 6.5 7.2  RBC 4.30 4.22 4.23  HGB 12.6 12.2 12.1  HCT 39.0 37.3 37.1  MCV 90.7 88.4 87.7  MCH 29.3 28.9 28.6  MCHC 32.3 32.7 32.6  RDW 14.4 14.3 14.4  PLT 197 197 192   BNP Recent Labs  Lab 07/08/24 1813  PROBNP 366.0*    Radiology/Studies:  DG Chest Portable 1 View Result Date: 07/08/2024 CLINICAL DATA:  Chest pain EXAM: PORTABLE CHEST 1 VIEW COMPARISON:  CXR 05/03/2020 FINDINGS: Left chest port is stable in position. The heart is enlarged. The lungs are clear. No pneumothorax or pleural effusion. No acute fractures. IMPRESSION: No active disease. Cardiomegaly. Electronically Signed   By: Greig Pique M.D.   On: 07/08/2024 17:48   Assessment and Plan:  [name] is a 73 y.o. female with stage IV breast cancer with metastatic disease to the lungs, COPD on  4L home O2, HTN, OSA, DM2, HFpEF, HLD, morbid obesity, and chronic back pain who presented with sx bradycardia in the setting of CHB.   CHB Chest pain  Stage IV breast CA Review of prior ECG with bifascicular block (RBBB/LAFB) and intermittent first-degree AV block which has been present for years.  She does have chest pain that is potentially anginal in nature however she was wanting to manage this with medications as opposed to invasive measures if possible.  She also has severe back pain which has been chronic.  I think the likelihood of obstructive CAD leading to CHB with known prior conduction disease and without acute MI is very low.  She understands that fixing her conduction disease will not alleviate her chest discomfort if the chest pain was present when her heart rate was normal prior to several weeks ago.  Her LVEF was preserved on recent TTE.  We reviewed the risk, benefits and alternatives of PPM implant including but not limited to incisional pain, bleeding, hematoma, lead perforation or dislodgment, damage to the heart and/or lung requiring emergency drain and/or median sternotomy, arrhythmias, and death.  Her goals of care are to live long enough to see her grandson graduate high school who is currently 74 years old.  She has been fairly stable on trastuzumab .  She has had breast cancer for years (dx 11/17/12).  She does have some memory issues that she thinks led to the progression of her breast cancer however she has been fairly good with family supportive taking herceptin /Letrozole .  She does not recall taking any additional bisoprolol  recently and has been on a low dose.  I do not think there is any reversible cause for her progressive electrical delay and would plan for PPM implant today.  She will have a high degree of pacing burden so would plan for LEFT DC/LBaP PPM implant.  She is agreeable to proceed.  Reviewed allergies, only relevant penicillin. sCr 0.8 today.   For questions or  updates, please contact [facility] Please consult [email or web address] for contact info under   Signed, [name], MD  07/09/2024 7:50 AM

## 2024-07-09 NOTE — Discharge Instructions (Signed)
 After Your Pacemaker   Do not lift your arm above shoulder height for 1 week after your procedure. After 7 days, you may progress as below.     Saturday July 16, 2024  Sunday July 17, 2024 Monday July 18, 2024 Tuesday July 19, 2024   Do not lift, push, pull, or carry anything over 10 pounds with the affected arm until 6 weeks after your procedure.   Monitor your pacemaker site for redness, swelling, and drainage. Call the device clinic at [phone] if you experience these symptoms or fever/chills.  If your incision is closed with Dermabond:  You may shower 1 day after your pacemaker implant and wash your incision with soap and water. Avoid lotions, ointments, or perfumes over your incision until it is well-healed.  You may use a hot tub or a pool AFTER your wound check appointment if the incision is completely closed.  You may drive, unless driving has been restricted by your healthcare providers.  Remote monitoring is used to monitor your pacemaker from home. This monitoring is scheduled every 91 days by our office. It allows us  to keep an eye on the functioning of your device to ensure it is working properly. You will routinely see your Electrophysiologist annually (more often if necessary).

## 2024-07-09 NOTE — Consult Note (Signed)
 Cardiology Consultation:   Patient ID: [name] MRN: [id number]; DOB: 11/12/1950  Admit date: 07/08/2024 Date of Consult: 07/09/2024  Primary Care Provider: [name], MD [facility] Cardiologist: Odis Brownie, MD  [facility] Electrophysiologist: Adina Primus, MD   Patient Profile:   [name] is a 73 y.o. 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  History of Present Illness:   [name] was evaluated by Dr. Brownie on 06/06/2024 in the cardio oncology clinic for monitoring of cardiotoxicity while undergoing treatment for metastatic breast cancer.  She did report intermittent chest discomfort then along with significant chronic back pain.  The pain was so severe that she felt sometimes she had syncope related to pain.  Her main concern at that time was weakness and her goals of care were living until her grandson graduated from high school.  She was not interested in invasive testing to determine whether she had obstructive coronary disease and she wanted to manage her symptoms with medications if possible to avoid invasive testing.  She was started on a low-dose of bisoprolol  2.5 mg daily but otherwise no medication changes.  Over the past several weeks she noticed an acute change in her memory along with worsening fatigue and generalized malaise.  She thought that it may have been related to her blood pressure so she ordered a new blood pressure cuff as her heart rate was in the 30s with what she had.  After receiving her new blood pressure cuff it said the same thing that her heart rate was stuck in the 30s at which time she presented to the ED as she felt worsening dyspnea and generalized fatigue.  She has had chest pain both at rest and exertion however this does  not seem to be significantly different than it was when she saw Dr. Brownie last month.  On evaluation emergency department she was hypertensive with BP 188/56, bradycardic with heart rate 30s and on her normal 4L [place].  Lab work was overall unremarkable other than an elevated proBNP of 366. hsT with minimal elevation but overall flat trend (28->25).   During my assessment this morning she was sitting comfortably in a chair although reporting some back pain.  Heart rate remains in the 30s.  Her family helps her to take her medications.  Past Medical History:  Diagnosis Date   Arthritis    Back pain    Breast cancer (HCC) dx'd 11/2012   left   Chest pain    COPD (chronic obstructive pulmonary disease) (HCC)    Diabetes mellitus without complication (HCC) 09/28/2014   Ear pain    Fatty liver 6/03   Hypertension    Lung disease    Lung metastases dx'd 11/2012   Obesity, unspecified    Other abnormal glucose    Suicide attempt (HCC) 1996   Syncope and collapse    Unspecified sleep apnea    Past Surgical History:  Procedure Laterality Date   CARDIAC CATHETERIZATION     2007   CHOLECYSTECTOMY     TUBAL LIGATION       Home Medications:  Prior to Admission medications   Medication Sig Start Date End Date Taking? Authorizing Provider  albuterol  (VENTOLIN  HFA) 108 (90 Base) MCG/ACT inhaler INHALE 2 PUFFS INTO THE LUNGS EVERY  6 HOURS AS NEEDED FOR WHEEZE 11/15/23   [name], MD  insulin  glargine (LANTUS  SOLOSTAR) 100 UNIT/ML Solostar Pen Inject 20 Units into the skin daily. 04/11/24   Donzetta Rollene BRAVO, MD  letrozole  (FEMARA ) 2.5 MG tablet TAKE 1 TABLET BY MOUTH EVERY DAY 05/12/23   Iruku, Praveena, MD    Inpatient Medications: Scheduled Meds:  Chlorhexidine  Gluconate Cloth  6 each Topical Daily   insulin  aspart  0-15 Units Subcutaneous Q4H   insulin  glargine-yfgn  10 Units Subcutaneous Daily   letrozole   2.5 mg Oral Daily   sodium chloride  flush  10-40 mL Intracatheter Q12H    Continuous Infusions:  PRN Meds: albuterol , docusate sodium , ondansetron  (ZOFRAN ) IV, mouth rinse, polyethylene glycol, sodium chloride  flush  Allergies:    Allergies  Allergen Reactions   Meperidine Hcl Anaphylaxis   Penicillins Anaphylaxis    Has patient had a PCN reaction causing immediate rash, facial/tongue/throat swelling, SOB or lightheadedness with hypotension: yes Has patient had a PCN reaction causing severe rash involving mucus membranes or skin necrosis: no Has patient had a PCN reaction that required hospitalization yes Has patient had a PCN reaction occurring within the last 10 years: no If all of the above answers are NO, then may proceed with Cephalosporin use.    Aspirin  Nausea And Vomiting    Social History:   Social History   Socioeconomic History   Marital status: Divorced    Spouse name: Not on file   Number of children: 4   Years of education: 12   Highest education level: 12th grade  Occupational History   Occupation: Disability   Occupation: retired-daycare  Tobacco Use   Smoking status: Former    Current packs/day: 0.00    Average packs/day: 3.0 packs/day for 5.0 years (15.0 ttl pk-yrs)    Types: Cigarettes    Start date: 08/19/1963    Quit date: 08/18/1968    Years since quitting: 55.9    Passive exposure: Past   Smokeless tobacco: Never  Vaping Use   Vaping status: Never Used  Substance and Sexual Activity   Alcohol use: No    Alcohol/week: 0.0 standard drinks of alcohol   Drug use: Yes    Types: Marijuana    Comment: Medical Marijuana   Sexual activity: Not Currently    Partners: Male  Other Topics Concern   Not on file  Social History Narrative   Health Care POA:    Emergency Contact: Alyse Ruth [phone]   End of Life Plan:    Who lives with you: two grandchildren, friend, daughter   Any pets: 3 poodles   Diet: Pt has a variety of protein, starch, vegetables.  Pt is currently working on cutting back on portions for weight  loss.   Exercise: Pt has not regular exercise routine.  Occasionally walks around home.   Seatbelts: Pt reports wearing seatbelt occasionally.   Sun Exposure/Protection: Pt does not use sun protection   Hobbies: reading, playing on kindle, ebay         Currently in her home she keeps her granddaughter Chuck Pounds, 16, who is the daughter of the patient's daughter Rilla (the patient refers to [name] as my adopted daughter); grandson Brunilda Eble, [place], who is [name]'s half-brother; daughter Butler, and an Algerian friend, Bonner Ruth Forbes, the patient's significant other. Daughter Butler is a Public Librarian, currently unemployed. Son Charlie Dillon Junior works as an personnel officer in [place]. Daughter Rilla is currently in prison due to killing someone in  a car accident. Daughter [name] died from aplastic anemia at the age of 28. The patient has a total of 4 grandchildren. She is not a church attender   Social Drivers of Health   Financial Resource Strain: High Risk (05/02/2023)   Overall Financial Resource Strain (CARDIA)    Difficulty of Paying Living Expenses: Very hard  Food Insecurity: Food Insecurity Present (07/08/2024)   Hunger Vital Sign    Worried About Running Out of Food in the Last Year: Often true    Ran Out of Food in the Last Year: Often true  Transportation Needs: Unmet Transportation Needs (07/08/2024)   PRAPARE - Administrator, Civil Service (Medical): Yes    Lack of Transportation (Non-Medical): Yes  Physical Activity: Unknown (05/02/2023)   Exercise Vital Sign    Days of Exercise per Week: Patient declined    Minutes of Exercise per Session: Not on file  Stress: Stress Concern Present (05/02/2023)   [employer] of Occupational Health - Occupational Stress Questionnaire    Feeling of Stress : To some extent  Social Connections: Moderately Isolated (07/08/2024)   Social Connection and Isolation Panel    Frequency of  Communication with Friends and Family: More than three times a week    Frequency of Social Gatherings with Friends and Family: Never    Attends Religious Services: Never    Database Administrator or Organizations: No    Attends Banker Meetings: Never    Marital Status: Living with partner  Intimate Partner Violence: Not At Risk (07/08/2024)   Humiliation, Afraid, Rape, and Kick questionnaire    Fear of Current or Ex-Partner: No    Emotionally Abused: No    Physically Abused: No    Sexually Abused: No    Family History:    Family History  Problem Relation Age of Onset   Coronary artery disease Father 46   Diabetes Father    Heart disease Father    Breast cancer Mother 36   Cancer Mother 30       breast   Aplastic anemia Daughter        died at age 20   Cancer Maternal Aunt 1       ovarian   Cancer Maternal Grandmother 50       ovarian   Cancer Paternal Aunt 53       ovarian/breast/breast   Coronary artery disease Sister 15   Coronary artery disease Brother 50    ROS:  Review of Systems: [y] = yes, [ ]  = no      General: Weight gain [ ] ; Weight loss [ ] ; Anorexia [ ] ; Fatigue [ ] ; Fever [ ] ; Chills [ ] ; Weakness [ ]    Cardiac: Chest pain/pressure [y]; Resting SOB [y]; Exertional SOB [y]; Orthopnea [ ] ; Pedal Edema [ ] ; Palpitations [ ] ; Syncope [ ] ; Presyncope [ ] ; Paroxysmal nocturnal dyspnea [ ]    Pulmonary: Cough [ ] ; Wheezing [ ] ; Hemoptysis [ ] ; Sputum [ ] ; Snoring [ ]    GI: Vomiting [ ] ; Dysphagia [ ] ; Melena [ ] ; Hematochezia [ ] ; Heartburn [ ] ; Abdominal pain [ ] ; Constipation [ ] ; Diarrhea [ ] ; BRBPR [ ]    GU: Hematuria [ ] ; Dysuria [ ] ; Nocturia [ ]  Vascular: Pain in legs with walking [ ] ; Pain in feet with lying flat [ ] ; Non-healing sores [ ] ; Stroke [ ] ; TIA [ ] ; Slurred speech [ ] ;   Neuro: Headaches [ ] ; Vertigo [ ] ;  Seizures [ ] ; Paresthesias [ ] ;Blurred vision [ ] ; Diplopia [ ] ; Vision changes [ ]    Ortho/Skin: Arthritis [ ] ; Joint pain [ ] ;  Muscle pain [ ] ; Joint swelling [ ] ; Back Pain [ ] ; Rash [ ]    Psych: Depression [ ] ; Anxiety [ ]    Heme: Bleeding problems [ ] ; Clotting disorders [ ] ; Anemia [ ]    Endocrine: Diabetes [ ] ; Thyroid dysfunction [ ]    Physical Exam/Data:   Vitals:   07/09/24 0500 07/09/24 0530 07/09/24 0600 07/09/24 0630  BP: (!) 151/45 (!) 140/51 (!) 146/54 (!) 145/51  Pulse: (!) 31 (!) 32 (!) 31 (!) 31  Resp: 19 11 18 15   Temp:      TempSrc:      SpO2: 100% 100% 100% 100%  Weight:       No intake or output data in the 24 hours ending 07/09/24 0750    07/09/2024    4:29 AM 07/08/2024    5:21 PM 06/06/2024    2:38 PM  Last 3 Weights  Weight (lbs) 300 lb 1.6 oz 294 lb 294 lb  Weight (kg) 136.124 kg 133.358 kg 133.358 kg     Body mass index is 51.51 kg/m.  General:  Well nourished, well developed, in no acute distress Cardiac:  normal S1, S2; slow rate, regular rhythm; no murmur  Lungs:  clear to auscultation bilaterally, no wheezing, rhonchi or rales  Abd: soft, nontender, no hepatomegaly   EKG:  The EKG (07/08/24, 22:06:48) was personally reviewed and demonstrates: CHB/VE 36, QRS 146, QT/c 540/417  Telemetry:  Telemetry was personally reviewed and demonstrates: VE 30s, CHB  Relevant CV Studies:  TTE Result date: 01/13/24  1. Left ventricular ejection fraction, by estimation, is 65%. Left  ventricular ejection fraction by 3D volume is 61 %. Left ventricular  ejection fraction by 2D MOD biplane is 65.1 %. The left ventricle has  normal function. The left ventricle has no  regional wall motion abnormalities. There is mild concentric left  ventricular hypertrophy. Left ventricular diastolic parameters are  consistent with Grade I diastolic dysfunction (impaired relaxation). The  average left ventricular global longitudinal  strain is -21.4 %. The global longitudinal strain is normal.   2. Right ventricular systolic function is normal. The right ventricular  size is not well visualized.  Tricuspid regurgitation signal is inadequate  for assessing PA pressure.   3. The mitral valve is normal in structure. No evidence of mitral valve  regurgitation. No evidence of mitral stenosis.   4. The aortic valve is tricuspid. Aortic valve regurgitation is not  visualized. No aortic stenosis is present.   5. There is dilatation of the ascending aorta, measuring 41 mm.   6. The inferior vena cava is normal in size with greater than 50%  respiratory variability, suggesting right atrial pressure of 3 mmHg.   Laboratory Data:  High Sensitivity Troponin:   Recent Labs  Lab 07/08/24 2232 07/09/24 0051  TROPONINIHS 28* 25*     Chemistry Recent Labs  Lab 07/08/24 1813 07/08/24 2232 07/09/24 0051  NA 140 141 143  K 3.8 3.9 4.4  CL 103 101 105  CO2 28 29 22   GLUCOSE 190* 149* 126*  BUN 21 19 19   CREATININE 0.76 0.83 0.80  CALCIUM 9.3 9.0 9.1  GFRNONAA >60 >60 >60  ANIONGAP 10 11 16*    Recent Labs  Lab 07/08/24 1813 07/08/24 2232  PROT 7.0 6.5  ALBUMIN 3.5 2.9*  AST 38 34  ALT 41 38  ALKPHOS 91 73  BILITOT 0.5 0.9   Hematology Recent Labs  Lab 07/08/24 1813 07/08/24 2232 07/09/24 0051  WBC 6.6 6.5 7.2  RBC 4.30 4.22 4.23  HGB 12.6 12.2 12.1  HCT 39.0 37.3 37.1  MCV 90.7 88.4 87.7  MCH 29.3 28.9 28.6  MCHC 32.3 32.7 32.6  RDW 14.4 14.3 14.4  PLT 197 197 192   BNP Recent Labs  Lab 07/08/24 1813  PROBNP 366.0*    Radiology/Studies:  DG Chest Portable 1 View Result Date: 07/08/2024 CLINICAL DATA:  Chest pain EXAM: PORTABLE CHEST 1 VIEW COMPARISON:  CXR 05/03/2020 FINDINGS: Left chest port is stable in position. The heart is enlarged. The lungs are clear. No pneumothorax or pleural effusion. No acute fractures. IMPRESSION: No active disease. Cardiomegaly. Electronically Signed   By: Greig Pique M.D.   On: 07/08/2024 17:48   Assessment and Plan:  JHANA GIARRATANO is a 73 y.o. female with stage IV breast cancer with metastatic disease to the lungs, COPD on  4L home O2, HTN, OSA, DM2, HFpEF, HLD, morbid obesity, and chronic back pain who presented with sx bradycardia in the setting of CHB.   CHB Chest pain  Stage IV breast CA Review of prior ECG with bifascicular block (RBBB/LAFB) and intermittent first-degree AV block which has been present for years.  She does have chest pain that is potentially anginal in nature however she was wanting to manage this with medications as opposed to invasive measures if possible.  She also has severe back pain which has been chronic.  I think the likelihood of obstructive CAD leading to CHB with known prior conduction disease and without acute MI is very low.  She understands that fixing her conduction disease will not alleviate her chest discomfort if the chest pain was present when her heart rate was normal prior to several weeks ago.  Her LVEF was preserved on recent TTE.  We reviewed the risk, benefits and alternatives of PPM implant including but not limited to incisional pain, bleeding, hematoma, lead perforation or dislodgment, damage to the heart and/or lung requiring emergency drain and/or median sternotomy, arrhythmias, and death.  Her goals of care are to live long enough to see her grandson graduate high school who is currently 74 years old.  She has been fairly stable on trastuzumab .  She has had breast cancer for years (dx 11/17/12).  She does have some memory issues that she thinks led to the progression of her breast cancer however she has been fairly good with family supportive taking herceptin /Letrozole .  She does not recall taking any additional bisoprolol  recently and has been on a low dose.  I do not think there is any reversible cause for her progressive electrical delay and would plan for PPM implant today.  She will have a high degree of pacing burden so would plan for LEFT DC/LBaP PPM implant.  She is agreeable to proceed.  Reviewed allergies, only relevant penicillin. sCr 0.8 today.   For questions or  updates, please contact [facility] Please consult [email or web address] for contact info under   Signed, [name], MD  07/09/2024 7:50 AM

## 2024-07-09 NOTE — Inpatient Diabetes Management (Signed)
 Inpatient Diabetes Program Recommendations  AACE/ADA: New Consensus Statement on Inpatient Glycemic Control (2015)  Target Ranges:  Prepandial:   less than 140 mg/dL      Peak postprandial:   less than 180 mg/dL (1-2 hours)      Critically ill patients:  140 - 180 mg/dL   Lab Results  Component Value Date   GLUCAP 151 (H) 07/09/2024   HGBA1C 6.6 04/11/2024    Review of Glycemic Control  Latest Reference Range & Units 07/09/24 04:06 07/09/24 07:55  Glucose-Capillary 70 - 99 mg/dL 893 (H) 848 (H)   Diabetes history: DM 2 Outpatient Diabetes medications:  Ozempic  2 mg daily, Humalog  8-10 units tid with meals, Lantus  16-18 units daily Current orders for Inpatient glycemic control:  Novolog  0-15 units q 4 hours Semglee  10 units daily  Inpatient Diabetes Program Recommendations:    Agree with current orders.  Patient is wearing home CGM.  Will still need to check CBG's with hospital meter to verify CBG's.    Thanks,  Randall Bullocks, RN, BC-ADM Inpatient Diabetes Coordinator Pager [phone]  (8a-5p)

## 2024-07-09 NOTE — Interval H&P Note (Signed)
 History and Physical Interval Note:  07/09/2024 8:08 AM  [name]  has presented today for surgery, with the diagnosis of heart block.  The various methods of treatment have been discussed with the patient and family. After consideration of risks, benefits and other options for treatment, the patient has consented to  Procedure(s): PACEMAKER IMPLANT (N/A) as a surgical intervention.  The patient's history has been reviewed, patient examined, no change in status, stable for surgery.  I have reviewed the patient's chart and labs.  Questions were answered to the patient's satisfaction.     [name]

## 2024-07-09 NOTE — Care Management Obs Status (Signed)
 MEDICARE OBSERVATION STATUS NOTIFICATION   Patient Details  Name: [name] MRN: [id number] Date of Birth: [birth year]   Medicare Observation Status Notification Given:  Yes    Marval Gell, RN 07/09/2024, 1:33 PM

## 2024-07-09 NOTE — Care Management (Signed)
 Spoke w patient and at her request reached outto [employer] to assess her home portable oxygen  concentrator. She states it is not always working properly. She confirms that her home unit is working fine. I have left a message for [name] and [name] to follow up with her Monday

## 2024-07-11 ENCOUNTER — Telehealth: Payer: Self-pay

## 2024-07-11 ENCOUNTER — Other Ambulatory Visit (HOSPITAL_COMMUNITY): Payer: Self-pay

## 2024-07-11 DIAGNOSIS — J449 Chronic obstructive pulmonary disease, unspecified: Secondary | ICD-10-CM

## 2024-07-11 MED FILL — Midazolam HCl Inj 2 MG/2ML (Base Equivalent): INTRAMUSCULAR | Qty: 1 | Status: AC

## 2024-07-11 NOTE — Progress Notes (Signed)
 [name]                                          MRN: [id number]   07/11/2024   The [facility] Quality Team Specialist reviewed this patient medical record for the purposes of chart review for care gap closure. The following were reviewed: erroneous encounter.    [facility]BCI Quality Team

## 2024-07-11 NOTE — Telephone Encounter (Signed)
 Spoke with patient. She informed me that her Cardiologist told her to reach out to her PCP. For them to order her an portable oxygen  tank because it will be easier for her to carry around since she has limited motion in her left arm. She stated that he said to make sure that the  Portable Concentrators Plus Dose Sitting oxygen  tank needs to be ordered. She also wanted to know who stopped her Ozempic  medication and why? Nelson Land, CMA

## 2024-07-11 NOTE — Telephone Encounter (Signed)
 Patient calls nurse line requesting a DME order.   She reports she needs an order for portable oxygen . She reports the tanks she has are two big and she reports they aren't working well.   She reports she discussed with [employer] and reports they will come and perform her walk test at home.   I do think a referral to pulmonology will be beneficial for O2 equipment in the future.  She is also requesting a refill on Ozempic .  Encouraged PCP followup. She declined at this time.

## 2024-07-12 ENCOUNTER — Ambulatory Visit (HOSPITAL_COMMUNITY)
Admission: RE | Admit: 2024-07-12 | Discharge: 2024-07-12 | Disposition: A | Discharge status: Home / Self Care | Source: Ambulatory Visit | Attending: Family Medicine | Admitting: Family Medicine

## 2024-07-12 ENCOUNTER — Telehealth: Payer: Self-pay

## 2024-07-12 ENCOUNTER — Encounter (HOSPITAL_COMMUNITY): Payer: Self-pay

## 2024-07-12 ENCOUNTER — Ambulatory Visit (HOSPITAL_COMMUNITY): Payer: Self-pay | Admitting: Family Medicine

## 2024-07-12 VITALS — BP 161/58 | HR 60 | Ht 65.0 in | Wt 300.4 lb

## 2024-07-12 DIAGNOSIS — R0602 Shortness of breath: Secondary | ICD-10-CM | POA: Diagnosis not present

## 2024-07-12 DIAGNOSIS — R079 Chest pain, unspecified: Secondary | ICD-10-CM | POA: Diagnosis not present

## 2024-07-12 DIAGNOSIS — M549 Dorsalgia, unspecified: Secondary | ICD-10-CM | POA: Diagnosis not present

## 2024-07-12 DIAGNOSIS — I442 Atrioventricular block, complete: Secondary | ICD-10-CM | POA: Diagnosis not present

## 2024-07-12 DIAGNOSIS — G8929 Other chronic pain: Secondary | ICD-10-CM | POA: Diagnosis not present

## 2024-07-12 DIAGNOSIS — C50919 Malignant neoplasm of unspecified site of unspecified female breast: Secondary | ICD-10-CM | POA: Diagnosis not present

## 2024-07-12 DIAGNOSIS — I25119 Atherosclerotic heart disease of native coronary artery with unspecified angina pectoris: Secondary | ICD-10-CM | POA: Diagnosis not present

## 2024-07-12 DIAGNOSIS — Z950 Presence of cardiac pacemaker: Secondary | ICD-10-CM | POA: Diagnosis not present

## 2024-07-12 DIAGNOSIS — C7800 Secondary malignant neoplasm of unspecified lung: Secondary | ICD-10-CM | POA: Diagnosis not present

## 2024-07-12 DIAGNOSIS — Z853 Personal history of malignant neoplasm of breast: Secondary | ICD-10-CM | POA: Insufficient documentation

## 2024-07-12 NOTE — Patient Instructions (Addendum)
 Thank you for coming in today  If you had labs drawn today, any labs that are abnormal the clinic will call you No news is good news  EKG was done today   Pacemaker wound check appointment was made for you today scheduled for:  Tuesday 07/19/2024 at 11:20 am  Medications: No changes  Follow up appointments:  Your physician recommends that you schedule a follow-up appointment in:  6 months with Dr. Zenaida Please call our office to schedule the follow-up appointment in March 2026 for May 2026   Do the following things EVERYDAY: Weigh yourself in the morning before breakfast. Write it down and keep it in a log. Take your medicines as prescribed Eat low salt foods--Limit salt (sodium) to 2000 mg per day.  Stay as active as you can everyday Limit all fluids for the day to less than 2 liters   At the [facility], you and your health needs are our priority. As part of our continuing mission to provide you with exceptional heart care, we have created designated Provider Care Teams. These Care Teams include your primary Cardiologist (physician) and Advanced Practice Providers (APPs- Physician Assistants and Nurse Practitioners) who all work together to provide you with the care you need, when you need it.   You may see any of the following providers on your designated Care Team at your next follow up: Dr Toribio Fuel Dr Ezra Shuck Dr. Ria Gardenia Greig Lenetta, NP Caffie Shed, [place] [facility] [place], [place] Beckey Coe, NP Tinnie Redman, PharmD   Please be sure to bring in all your medications bottles to every appointment.    Thank you for choosing [facility]  If you have any questions or concerns before your next appointment please send us  a message through [place] or call our office at [phone].    TO LEAVE A MESSAGE FOR THE NURSE SELECT OPTION 2, PLEASE LEAVE A MESSAGE INCLUDING: YOUR  NAME DATE OF BIRTH CALL BACK NUMBER REASON FOR CALL**this is important as we prioritize the call backs  YOU WILL RECEIVE A CALL BACK THE SAME DAY AS LONG AS YOU CALL BEFORE 4:00 PM

## 2024-07-12 NOTE — Telephone Encounter (Signed)
 Follow-up after same day discharge: Implant date: 07/09/2024 MD: MCA Device: PPM Location: L chest    Wound check visit: 07/19/2024 90 day MD follow-up: 10/11/2024  Remote Transmission received:yes  Dressing/sling removed: n/a  Confirm OAC restart on: yes   Please continue to monitor your cardiac device site for redness, swelling, and drainage. Call the device clinic at [phone] if you experience these symptoms, fever/chills, or have questions about your device.   Remote monitoring is used to monitor your cardiac device from home. This monitoring is scheduled every 91 days by our office. It allows us  to keep an eye on the functioning of your device to ensure it is working properly.

## 2024-07-12 NOTE — Progress Notes (Signed)
 Cardio-Oncology Clinic Consult Note    Primary Care: [name], MD Primary Cardiologist: Dr. Zenaida  HPI:  [name] is a 73 y.o. female with past medical history of stage IV breast cancer who was referred to establish in the cardio-oncology clinic for monitoring of cardio-toxicity while undergoing chemotherapy.        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 Magrinat     05/23/2013 Initial Diagnosis   Malignant neoplasm of upper-inner quadrant of left breast in female, estrogen receptor positive (HCC)         Initial Cardio-Oncology visit with Dr. Zenaida 05/2024 she endorsed chronic pain, as well as intermittent chest pain. She was not interested in invasive work up or procedures, main focus is QOL. She was started on low dose beta blocker and given PRN nitroglycerin .   Admitted 11/25 with symptomatic bradycardia and CHB, HR 30's. She underwent PPM and was discharged home.  Today she returns for post hospital follow up.  Overall feeling fair. She has tenderness over PPM implant site, but no CP. She has not required nitroglycerin  use. She is chronically SOB, lives with daughter and grandson who help with ADLs. She wears 4 L oxygen  at home. Had sleep study years ago but could not tolerate CPAP.  Denies palpitations, abnormal bleeding, CP,  or dizziness. She has waxing and waning LEE.  She chronically sleeps in recliner due to chronic pain. Appetite ok. Weight at home 299-300 pounds. Taking all medications.  Medical history and current medications were reviewed in Epic as part of clinic visit.  Wt Readings from Last 3 Encounters:  07/12/24 (!) 136.3 kg (300 lb 6.4 oz)  07/09/24 (!) 136.1 kg (300 lb 1.6 oz)  06/06/24 133.4 kg (294 lb)   BP (!) 161/58   Pulse 60   Ht 5' 5 (1.651 m)   Wt (!) 136.3 kg (300 lb 6.4 oz)   SpO2 94% Comment: 4 Ls o2  BMI 49.99 kg/m   PHYSICAL EXAM: General:  NAD. No resp difficulty, arrived in [facility] on oxygen , chronically-ill appearing. HEENT: Normal Neck: Supple. No JVD. Thick neck Cor: Regular rate & rhythm. No rubs, gallops or murmurs. PPM site looks ok, no steri strips, edges approx with skin glue Lungs: Clear Abdomen: Obese, nontender, nondistended.  Extremities: No cyanosis, clubbing, rash, edema Neuro: Alert & oriented x 3, moves all 4 extremities w/o difficulty. Affect pleasant.  Device interrogation (personally reviewed): SR 1AVB, PR 228 msec  ECG (personally reviewed): VP >99%, <1% AT/AF burden  ASSESSMENT & PLAN: Anginal chest pain/CAD: Patient certainly had prior chest pain that appeared consistent with cardiac angina, coming on with exertion or stress. The pain is not what limited her most, that is her chronic back pain, and would be reasonable to treat medically as she is not interested in further noninvasive workup. Even if she would be  willing to undergo cardiac catheterization, it would be mainly for symptom relief and she has not yet trialed medical therapy. Dr Zenaida discussed this with the patient at previous visit and she was in agreement. She has had no further chest pain - Off bisoprolol  with CHB, no s/p PPM - Continue PRN nitroglycerin  for unrelenting chest pain - No further invasive or noninvasive workup indicated or desired - Agree with continued palliative care visits  for hopeful quality of life improvements - Not interested in statin therapy, this is reasonable - Continue intermittent cardio onc follow up and titration of medical therapy as needed - Recent normal echo   CHB - off beta blocker - s/p PPM - Does not have EP follow, will call and arrange  Follow up in 4-6 months with Dr. Zenaida.  Harlene Gainer, FNP-BC 07/12/24

## 2024-07-13 ENCOUNTER — Telehealth: Admitting: Family Medicine

## 2024-07-13 ENCOUNTER — Other Ambulatory Visit (HOSPITAL_COMMUNITY): Payer: Self-pay

## 2024-07-13 ENCOUNTER — Other Ambulatory Visit: Payer: Self-pay

## 2024-07-13 ENCOUNTER — Encounter: Payer: Self-pay | Admitting: Family Medicine

## 2024-07-13 DIAGNOSIS — J449 Chronic obstructive pulmonary disease, unspecified: Secondary | ICD-10-CM | POA: Diagnosis not present

## 2024-07-13 DIAGNOSIS — E1149 Type 2 diabetes mellitus with other diabetic neurological complication: Secondary | ICD-10-CM

## 2024-07-13 MED ORDER — OZEMPIC (2 MG/DOSE) 8 MG/3ML ~~LOC~~ SOPN
2.0000 mg | PEN_INJECTOR | SUBCUTANEOUS | 1 refills | Status: AC
  Filled 2024-07-13: qty 9, 84d supply, fill #0

## 2024-07-13 NOTE — Progress Notes (Signed)
 DME order and patient demographics faxed to [employer], per patient preference.

## 2024-07-13 NOTE — Patient Instructions (Signed)
 It was wonderful to see you today.  Please bring ALL of your medications with you to every visit.   Today we talked about:  **  Please follow up in 3 months   Thank you for choosing [facility].   Please call [phone] with any questions about today's appointment.  Please be sure to schedule follow up at the front desk before you leave today.   Areta Saliva, MD  Family Medicine

## 2024-07-13 NOTE — Telephone Encounter (Signed)
 Community message sent to [employer]. See below response.   For one [employer] does not do walk test at patients home they have to come in the local branch to be tested.........and we would need a new order for a poc evaluation and it would need to say Please evaluate patient for a poc 1-6 pulse dose and if patient qualifies please dispense that would cover order for the evaluation and for us  to dispense the poc.   Chiquita JAYSON English, RN

## 2024-07-13 NOTE — Progress Notes (Signed)
  Family Medicine Center Telemedicine Visit  Patient consented to have virtual visit and was identified by name and date of birth. Method of visit: Video  Encounter participants: Patient: [name] - located at home Provider: Areta Saliva - located at [facility]   Chief Complaint:  HPI: Wants portable oxygen . Her large oxygen  tank is not working. Goes from 4 to 1 L and patient has to keep increasing oxygen .  Patient needs portable oxygen  to travel to doctors office or the store.  Has 4L consistent oxygen  use at home.  Has been on oxygen  for COPD for many years.  Has stage 4 cancer and wants to limit visits to multiple different doctors.   ROS: per HPI  Pertinent PMHx: COPD on 4L home O2, OSA  Exam:  Respiratory: On 4 L oxygen  talking comfortably in full sentences.   Assessment/Plan:  Assessment & Plan Chronic obstructive pulmonary disease, unspecified COPD type (HCC) Patient stable on 4 L O2. Would benefit greatly from portable oxygen  given she has many medical complex diagnoses and regularly needs to leave the house to go to the doctor.  - DME order for portable oxygen  to [employer] [facility]   Patient due to physical and preventative tasks, wants to limit preventative visits given her stage 4 cancer.    Time spent during visit with patient: 15 minutes

## 2024-07-13 NOTE — Assessment & Plan Note (Signed)
 Patient stable on 4 L O2. Would benefit greatly from portable oxygen  given she has many medical complex diagnoses and regularly needs to leave the house to go to the doctor.  - DME order for portable oxygen  to [employer]

## 2024-07-19 ENCOUNTER — Ambulatory Visit: Attending: Cardiology

## 2024-07-19 DIAGNOSIS — I442 Atrioventricular block, complete: Secondary | ICD-10-CM

## 2024-07-19 NOTE — Patient Instructions (Signed)
? ?  After Your Pacemaker ? ? ?Monitor your pacemaker site for redness, swelling, and drainage. Call the device clinic at [phone] if you experience these symptoms or fever/chills. ? ?Your incision was closed with Dermabond:  You may shower 1 day after your defibrillator implant and wash your incision with soap and water. Avoid lotions, ointments, or perfumes over your incision until it is well-healed. and Your incision was closed with Steri-strips or staples:  You may shower 7 days after your procedure and wash your incision with soap and water. Avoid lotions, ointments, or perfumes over your incision until it is well-healed. ? ?You may use a hot tub or a pool after your wound check appointment if the incision is completely closed. ? ?Do not lift, push or pull greater than 10 pounds with the affected arm until 6 weeks after your procedure. There are no other restrictions in arm movement after your wound check appointment. ? ?You may drive, unless driving has been restricted by your healthcare providers. ? ? ?Remote monitoring is used to monitor your pacemaker from home. This monitoring is scheduled every 91 days by our office. It allows [place] to keep an eye on the functioning of your device to ensure it is working properly. You will routinely see your Electrophysiologist annually (more often if necessary).  ?

## 2024-07-20 NOTE — Progress Notes (Signed)
 Normal dual chamber pacemaker wound check. Presenting rhythm: AP-AS/VP. Wound well healed. Routine testing performed. Thresholds, sensing, and impedance consistent with implant measurements and at 3.5V safety margin/auto capture until 3 month visit. No episodes. Reviewed arm restrictions to continue for 6 weeks total post op.  Pt enrolled in remote follow-up.

## 2024-07-20 NOTE — Assessment & Plan Note (Signed)
 12/15/23: Metastatic breast cancer with lung mets Current treatment: Herceptin  and letrozole   Toxicities:  COPD Sleep Apnea Chest discomfort: seeing cardiology

## 2024-07-21 ENCOUNTER — Telehealth: Payer: Self-pay

## 2024-07-21 ENCOUNTER — Other Ambulatory Visit: Payer: Self-pay | Admitting: Nurse Practitioner

## 2024-07-21 DIAGNOSIS — C50919 Malignant neoplasm of unspecified site of unspecified female breast: Secondary | ICD-10-CM

## 2024-07-21 DIAGNOSIS — R110 Nausea: Secondary | ICD-10-CM

## 2024-07-21 NOTE — Telephone Encounter (Signed)
 Orders entered for Guardant Reveal per MD. Requisition and all supporting documents faxed with confirmation.

## 2024-07-22 NOTE — Telephone Encounter (Signed)
Community message sent to [employer] with updated order.   Hannah C Pipkin, RN  

## 2024-07-25 ENCOUNTER — Other Ambulatory Visit: Payer: Self-pay | Admitting: Hematology and Oncology

## 2024-07-25 ENCOUNTER — Inpatient Hospital Stay: Attending: Oncology | Admitting: Hematology and Oncology

## 2024-07-25 ENCOUNTER — Encounter: Payer: Self-pay | Admitting: Hematology and Oncology

## 2024-07-25 ENCOUNTER — Encounter: Payer: Self-pay | Admitting: Nurse Practitioner

## 2024-07-25 ENCOUNTER — Inpatient Hospital Stay: Attending: Oncology

## 2024-07-25 ENCOUNTER — Inpatient Hospital Stay (HOSPITAL_BASED_OUTPATIENT_CLINIC_OR_DEPARTMENT_OTHER): Admitting: Nurse Practitioner

## 2024-07-25 VITALS — BP 130/86 | HR 92 | Temp 98.0°F | Resp 12 | Ht 65.0 in | Wt 289.0 lb

## 2024-07-25 DIAGNOSIS — C50919 Malignant neoplasm of unspecified site of unspecified female breast: Secondary | ICD-10-CM

## 2024-07-25 DIAGNOSIS — M545 Low back pain, unspecified: Secondary | ICD-10-CM | POA: Insufficient documentation

## 2024-07-25 DIAGNOSIS — Z950 Presence of cardiac pacemaker: Secondary | ICD-10-CM

## 2024-07-25 DIAGNOSIS — G4700 Insomnia, unspecified: Secondary | ICD-10-CM

## 2024-07-25 DIAGNOSIS — Z79811 Long term (current) use of aromatase inhibitors: Secondary | ICD-10-CM | POA: Insufficient documentation

## 2024-07-25 DIAGNOSIS — G8929 Other chronic pain: Secondary | ICD-10-CM | POA: Insufficient documentation

## 2024-07-25 DIAGNOSIS — C7800 Secondary malignant neoplasm of unspecified lung: Secondary | ICD-10-CM | POA: Diagnosis not present

## 2024-07-25 DIAGNOSIS — R531 Weakness: Secondary | ICD-10-CM | POA: Diagnosis not present

## 2024-07-25 DIAGNOSIS — R530 Neoplastic (malignant) related fatigue: Secondary | ICD-10-CM

## 2024-07-25 DIAGNOSIS — G893 Neoplasm related pain (acute) (chronic): Secondary | ICD-10-CM

## 2024-07-25 DIAGNOSIS — Z515 Encounter for palliative care: Secondary | ICD-10-CM | POA: Diagnosis not present

## 2024-07-25 DIAGNOSIS — Z87891 Personal history of nicotine dependence: Secondary | ICD-10-CM | POA: Diagnosis not present

## 2024-07-25 DIAGNOSIS — C50212 Malignant neoplasm of upper-inner quadrant of left female breast: Secondary | ICD-10-CM

## 2024-07-25 DIAGNOSIS — Z170 Estrogen receptor positive status [ER+]: Secondary | ICD-10-CM | POA: Insufficient documentation

## 2024-07-25 DIAGNOSIS — Z5112 Encounter for antineoplastic immunotherapy: Secondary | ICD-10-CM | POA: Insufficient documentation

## 2024-07-25 DIAGNOSIS — F419 Anxiety disorder, unspecified: Secondary | ICD-10-CM

## 2024-07-25 MED ORDER — DIAZEPAM 5 MG PO TABS: 5.0000 mg | ORAL_TABLET | Freq: Two times a day (BID) | ORAL | 0 refills | Status: DC | PRN

## 2024-07-25 MED ORDER — TRASTUZUMAB-ANNS CHEMO 150 MG IV SOLR
750.0000 mg | Freq: Once | INTRAVENOUS | Status: AC
  Administered 2024-07-25: 750 mg via INTRAVENOUS
  Filled 2024-07-25: qty 35.71

## 2024-07-25 MED ORDER — LORAZEPAM 2 MG/ML IJ SOLN
0.5000 mg | Freq: Once | INTRAMUSCULAR | Status: AC
  Administered 2024-07-25: 0.5 mg via INTRAVENOUS
  Filled 2024-07-25: qty 1

## 2024-07-25 MED ORDER — SODIUM CHLORIDE 0.9 % IV SOLN: Freq: Once | INTRAVENOUS | Status: AC

## 2024-07-25 MED ORDER — ACETAMINOPHEN 325 MG PO TABS
650.0000 mg | ORAL_TABLET | Freq: Once | ORAL | Status: AC
  Administered 2024-07-25: 650 mg via ORAL
  Filled 2024-07-25: qty 2

## 2024-07-25 MED ORDER — DIPHENHYDRAMINE HCL 25 MG PO CAPS
50.0000 mg | ORAL_CAPSULE | Freq: Once | ORAL | Status: AC
  Administered 2024-07-25: 50 mg via ORAL
  Filled 2024-07-25: qty 2

## 2024-07-25 NOTE — Progress Notes (Signed)
 Patient Care Team: [name], MD as PCP - General (Family Medicine) Almetta Donnice LABOR, MD as PCP - Cardiology (Cardiology) Rolan Ezra RAMAN, MD as Consulting Physician (Cardiology) Loretha Ash, MD as Consulting Physician (Hematology and Oncology)  DIAGNOSIS:  Encounter Diagnosis  Name Primary?   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 Magrinat     05/23/2013 Initial Diagnosis   Malignant neoplasm of upper-inner quadrant of left breast in female, estrogen receptor positive (HCC)     CHIEF COMPLIANT:   HISTORY OF PRESENT ILLNESS:  History of Present Illness  obesity and chronic back pain.[name] is a 73 year old female with estrogen receptor positive breast cancer metastatic to the lung who presents for oncology follow-up to assess ongoing management and address recent cardiac and musculoskeletal complications.  She is on Herceptin  and letrozole  for metastatic breast cancer involving the left breast and lung. She has not had recent whole body imaging and prefers to avoid further diagnostic studies or detailed updates on her cancer status, prioritizing comfort-focused care over aggressive  monitoring.  During the week of Thanksgiving she underwent pacemaker placement for cardiac complications and had an intra-procedural myocardial infarction, similar to one 35 years ago. Since then her energy has improved with less fatigue, weakness, and oxygen  requirement. She is followed by cardiology, uses a home rhythm monitoring device, and notes elevated blood pressure, particularly during severe back pain.  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 She uses heat and cold therapy for relief but limits heat exposure because of her pacemaker. She also notes hoarseness and ongoing musculoskeletal discomfort, which she associates with episodes of elevated blood pressure.  She prefers limited information about cancer progression and expresses a goal of peaceful end-of-life without suffering from cancer or cardiac events. Her daughter assists with medical visits and care coordination.     ALLERGIES:  is allergic to meperidine hcl, penicillins, and aspirin .  MEDICATIONS:  Current Outpatient Medications  Medication Sig Dispense Refill   acetaminophen  (TYLENOL ) 500 MG tablet Take 500 mg by mouth every 6 (six) hours as needed for moderate pain (pain score 4-6).     diazepam  (VALIUM ) 5 MG tablet Take 5 mg by mouth 2 (two) times daily as needed for anxiety.     insulin  glargine (LANTUS  SOLOSTAR) 100 UNIT/ML Solostar Pen Inject 20 Units into the skin daily. 15 mL 6   loperamide  (IMODIUM  A-D) 2 MG tablet Take 2 mg by mouth daily as needed for diarrhea or loose stools.     nitroGLYCERIN  (NITROSTAT ) 0.4 MG SL tablet Place 0.4 mg under the tongue every 5 (five) minutes as needed for chest pain.     ondansetron  (ZOFRAN ) 8 MG tablet TAKE 1 TABLET BY  MOUTH EVERY 8 HOURS AS NEEDED 20 tablet 2   albuterol  (VENTOLIN  HFA) 108 (90 Base) MCG/ACT inhaler INHALE 2 PUFFS INTO THE LUNGS EVERY 6 HOURS AS NEEDED FOR WHEEZE 8.5 each 1   insulin  lispro (HUMALOG ) 100 UNIT/ML injection Inject 8-10  Units into the skin 3 (three) times daily before meals.     letrozole  (FEMARA ) 2.5 MG tablet TAKE 1 TABLET BY MOUTH EVERY DAY 90 tablet 12   Semaglutide , 2 MG/DOSE, (OZEMPIC , 2 MG/DOSE,) 8 MG/3ML SOPN Inject 2 mg into the skin every 7 (seven) days. 9 mL 1   No current facility-administered medications for this visit.    PHYSICAL EXAMINATION: ECOG PERFORMANCE STATUS: 1 - Symptomatic but completely ambulatory  Vitals:   07/25/24 1430  BP: 130/86  Pulse: 92  Resp: 12  Temp: 98 F (36.7 C)  SpO2: 100%   Filed Weights   07/25/24 1430  Weight: 289 lb (131.1 kg)    Physical Exam   (exam performed in the presence of a chaperone)  LABORATORY DATA:  I have reviewed the data as listed    Latest Ref Rng & Units 07/09/2024   12:51 AM 07/08/2024   10:32 PM 07/08/2024    6:13 PM  CMP  Glucose 70 - 99 mg/dL 873  850  809   BUN 8 - 23 mg/dL 19  19  21    Creatinine 0.44 - 1.00 mg/dL 9.19  9.16  9.23   Sodium 135 - 145 mmol/L 143  141  140   Potassium 3.5 - 5.1 mmol/L 4.4  3.9  3.8   Chloride 98 - 111 mmol/L 105  101  103   CO2 22 - 32 mmol/L 22  29  28    Calcium 8.9 - 10.3 mg/dL 9.1  9.0  9.3   Total Protein 6.5 - 8.1 g/dL  6.5  7.0   Total Bilirubin 0.0 - 1.2 mg/dL  0.9  0.5   Alkaline Phos 38 - 126 U/L  73  91   AST 15 - 41 U/L  34  38   ALT 0 - 44 U/L  38  41     Lab Results  Component Value Date   WBC 7.2 07/09/2024   HGB 12.1 07/09/2024   HCT 37.1 07/09/2024   MCV 87.7 07/09/2024   PLT 192 07/09/2024   NEUTROABS 4.4 07/08/2024    ASSESSMENT & PLAN:  Malignant neoplasm of upper-inner quadrant of left breast in female, estrogen receptor positive (HCC) 12/15/23: Metastatic breast cancer with lung mets Current treatment: Herceptin  cycle 22 and letrozole   Toxicities: Other than the cardiac issues she is tolerating the treatment fairly well.  She had a pacemaker implantation around Thanksgiving 2025.  We discussed about staging scans.  She tells me that she does not  want to do any more CT scans if the treatment is not working then she is going to die rather than doing routine scans for evaluation.  The reason why she does not want to do the scans is because of her obesity and chronic back pain.  COPD Sleep Apnea Chest discomfort: seeing cardiology: Had a recent pacemaker implantation   No orders of the defined types were placed in this encounter.  The patient has a good understanding of the overall plan. she agrees with it. she will call with any problems that may develop before the next visit here.  I personally spent a total of 30 minutes in the care of the patient today including preparing to see  the patient, getting/reviewing separately obtained history, performing a medically appropriate exam/evaluation, counseling and educating, placing orders, referring and communicating with other health care professionals, documenting clinical information in the EHR, independently interpreting results, communicating results, and coordinating care.   Viinay K Denice Cardon, MD 07/25/24

## 2024-07-25 NOTE — Progress Notes (Signed)
 Per Dr. Lonn, okay for patient to proceed with treatment today with last echo 5/28. Next echo has been scheduled for patient before next treatment.

## 2024-07-25 NOTE — Progress Notes (Signed)
 Palliative Medicine [facility]  [phone] [phone]   Name: [name] Date: 07/25/2024 MRN: [id number]  DOB: May 20, 1951  Patient Care Team: [name], MD as PCP - General (Family Medicine) Almetta Donnice LABOR, MD as PCP - Cardiology (Cardiology) Rolan Ezra RAMAN, MD as Consulting Physician (Cardiology) Loretha Ash, MD as Consulting Physician (Hematology and Oncology)    INTERVAL HISTORY: [name] is a 73 y.o. female with medical history including metastatic breast cancer with numerous pulmonary nodules bilaterally s/p chemotherapy currently on herceptin  for maintenance. Now with complaints of uncontrolled back pain.  Palliative ask to see for symptom management.    SOCIAL HISTORY:     reports that she quit smoking about 55 years ago. Her smoking use included cigarettes. She started smoking about 60 years ago. She has a 15 pack-year smoking history. She has been exposed to tobacco smoke. She has never used smokeless tobacco. She reports current drug use. Drug: Marijuana. She reports that she does not drink alcohol.  ADVANCE DIRECTIVES:  None on file  CODE STATUS: Full code  PAST MEDICAL HISTORY: Past Medical History:  Diagnosis Date   Arthritis    Back pain    Breast cancer (HCC) dx'd 11/2012   left   Chest pain    COPD (chronic obstructive pulmonary disease) (HCC)    Diabetes mellitus without complication (HCC) 09/28/2014   Ear pain    Fatty liver 6/03   Hypertension    Lung disease    Lung metastases dx'd 11/2012   Obesity, unspecified    Other abnormal glucose    Suicide attempt (HCC) 1996   Syncope and collapse    Unspecified sleep apnea     ALLERGIES:  is allergic to meperidine hcl, penicillins, and aspirin .  MEDICATIONS:  Current Outpatient Medications  Medication Sig Dispense Refill   acetaminophen  (TYLENOL ) 500 MG tablet Take 500 mg by mouth every 6 (six) hours as needed for moderate pain (pain  score 4-6).     albuterol  (VENTOLIN  HFA) 108 (90 Base) MCG/ACT inhaler INHALE 2 PUFFS INTO THE LUNGS EVERY 6 HOURS AS NEEDED FOR WHEEZE 8.5 each 1   diazepam  (VALIUM ) 5 MG tablet Take 5 mg by mouth 2 (two) times daily as needed for anxiety.     insulin  glargine (LANTUS  SOLOSTAR) 100 UNIT/ML Solostar Pen Inject 20 Units into the skin daily. 15 mL 6   insulin  lispro (HUMALOG ) 100 UNIT/ML injection Inject 8-10 Units into the skin 3 (three) times daily before meals.     letrozole  (FEMARA ) 2.5 MG tablet TAKE 1 TABLET BY MOUTH EVERY DAY 90 tablet 12   loperamide  (IMODIUM  A-D) 2 MG tablet Take 2 mg by mouth daily as needed for diarrhea or loose stools.     nitroGLYCERIN  (NITROSTAT ) 0.4 MG SL tablet Place 0.4 mg under the tongue every 5 (five) minutes as needed for chest pain.     ondansetron  (ZOFRAN ) 8 MG tablet TAKE 1 TABLET BY MOUTH EVERY 8 HOURS AS NEEDED 20 tablet 2   Semaglutide , 2 MG/DOSE, (OZEMPIC , 2 MG/DOSE,) 8 MG/3ML SOPN Inject 2 mg into the skin every 7 (seven) days. 9 mL 1   No current facility-administered medications for this visit.   Facility-Administered Medications Ordered in Other Visits  Medication Dose Route Frequency Provider Last Rate Last Admin   trastuzumab -anns (KANJINTI ) 750 mg in sodium chloride  0.9 % 250 mL chemo infusion  750 mg Intravenous Once Iruku, Praveena, MD  VITAL SIGNS: There were no vitals taken for this visit. There were no vitals filed for this visit.   Estimated body mass index is 48.09 kg/m as calculated from the following:   Height as of an earlier encounter on 07/25/24: 5' 5 (1.651 m).   Weight as of an earlier encounter on 07/25/24: 289 lb (131.1 kg).   PERFORMANCE STATUS (ECOG) : 1 - Symptomatic but completely ambulatory  Physical Exam General: NAD Cardiovascular: regular rate and rhythm Pulmonary: normal breathing pattern Extremities: no edema, no joint deformities Skin: no rashes Neurological: AAO x3  IMPRESSION: Discussed the use  of AI scribe software for clinical note transcription with the patient, who gave verbal consent to proceed.  History of Present Illness [name] is a 73 year old female with metastatic breast cancer who was seen during infusion for symptom management follow-up.  No acute distress noted.  Patient continues to take things one day at a time.  Reports ongoing fatigue.  Denies concerns with nausea.   Appetite is fair.  Some days are better than others.   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 States she experienced a period when her heart rate was consistently in the thirties for over a week, during which she felt weak and tired. She described an episode where her heart rate dropped to twenty, which was associated with sharp chest pain and a sensation of passing out. She is doing well post discharge on 07/09/2024.  No uncontrolled symptoms at this time. Continues on diazepam  as needed for anxiety and sleep. No changes to current regimen at this time. We will continue to closely support and follow as needed.   Goals of care [name] she has concerns that her body may be starting to physically show decline and that she will no longer be able to tolerate significant treatments in the future.  States she is questioning the need for further scans as she does not feel she will do anything differently based on the imaging results.  We discussed the purpose of echocardiogram and CT scans to assist the oncologist with directing further treatment paths.  [name] states she is not a candidate for any surgical procedures and I am sure that she will consider any additional options outside of her current regimen specifically stating she is not interested in pursuing radiation if ever cancer will still progress.  She empathetically approach discussions regarding her healthcare limitations and wishes.  Patient is clear and expressed wishes to continue to treat the  treatable allow her every opportunity to continue to thrive as she has no desire to give up at this time.  She does however states in the event of further health decline her desire would be for natural death with no life-sustaining measures.   I discussed the importance of continued conversation with family and their medical providers regarding overall plan of care and treatment options, ensuring decisions are within the context of the patients values and GOCs. Assessment & Plan Anxiety/insomnia Patient reports anxiety is controlled with the Valium  however she is still challenged with insomnia. Previous melatonin and Tylenol  PM trials ineffective or adverse. - Consider trial of Unisom. - Evaluate medication regimen for sleep improvement. -Valium  5 mg twice daily as needed.  Cancer-related fatigue and weakness Chronic fatigue and weakness from prolonged cancer treatment, causing mobility issues and falls. Insurance limits home health access.   Goals of Care She prefers natural disease progression without aggressive interventions, avoiding unnecessary tests or  treatments.  - Clear and expressed wishes to continue to treat the treatable allow her every opportunity to continue to thrive.  Patient's speech specifically if cancer was to progress she would not be interested in pursuing further radiation therapy or more extensive treatments. - Expresses wishes for natural death with no life-sustaining measures when her time comes.  Status post pacemaker placement for bradycardia Recent pacemaker placement due to bradycardia with heart rate dropping to 20 bpm. Pacemaker monitor indicates only one alert, suggesting stable cardiac function.  Follow-up -See patient back in the clinic in 4-6 weeks.  Sooner if needed.  Patient expressed understanding and was in agreement with this plan. She also understands that She can call the clinic at any time with any questions, concerns, or complaints.   Any  controlled substances utilized were prescribed in the context of palliative care. PDMP has been reviewed.   Visit consisted of counseling and education dealing with the complex and emotionally intense issues of symptom management and palliative care in the setting of serious and potentially life-threatening illness.  Levon Borer, AGPCNP-BC  Palliative Medicine Team/[facility]

## 2024-07-25 NOTE — Patient Instructions (Signed)
 [facility] - A DEPT OF [facility]  Discharge Instructions: Thank you for choosing [facility] to provide your oncology and hematology care.   If you have a lab appointment with the Cancer Center, please go directly to the Cancer Center and check in at the registration area.   Wear comfortable clothing and clothing appropriate for easy access to any Portacath or PICC line.   We strive to give you quality time with your provider. You may need to reschedule your appointment if you arrive late (15 or more minutes).  Arriving late affects you and other patients whose appointments are after yours.  Also, if you miss three or more appointments without notifying the office, you may be dismissed from the clinic at the provider's discretion.      For prescription refill requests, have your pharmacy contact our office and allow 72 hours for refills to be completed.    Today you received the following chemotherapy and/or immunotherapy agents: trastuzumab-anns      To help prevent nausea and vomiting after your treatment, we encourage you to take your nausea medication as directed.  BELOW ARE SYMPTOMS THAT SHOULD BE REPORTED IMMEDIATELY: *FEVER GREATER THAN 100.4 F (38 C) OR HIGHER *CHILLS OR SWEATING *NAUSEA AND VOMITING THAT IS NOT CONTROLLED WITH YOUR NAUSEA MEDICATION *UNUSUAL SHORTNESS OF BREATH *UNUSUAL BRUISING OR BLEEDING *URINARY PROBLEMS (pain or burning when urinating, or frequent urination) *BOWEL PROBLEMS (unusual diarrhea, constipation, pain near the anus) TENDERNESS IN MOUTH AND THROAT WITH OR WITHOUT PRESENCE OF ULCERS (sore throat, sores in mouth, or a toothache) UNUSUAL RASH, SWELLING OR PAIN  UNUSUAL VAGINAL DISCHARGE OR ITCHING   Items with * indicate a potential emergency and should be followed up as soon as possible or go to the Emergency Department if any problems should occur.  Please show the CHEMOTHERAPY ALERT CARD or  IMMUNOTHERAPY ALERT CARD at check-in to the Emergency Department and triage nurse.  Should you have questions after your visit or need to cancel or reschedule your appointment, please contact [facility] - A DEPT OF [facility]  Dept: [phone]  and follow the prompts.  Office hours are 8:00 a.m. to 4:30 p.m. Monday - Friday. Please note that voicemails left after 4:00 p.m. may not be returned until the following business day.  We are closed weekends and major holidays. You have access to a nurse at all times for urgent questions. Please call the main number to the clinic Dept: [phone] and follow the prompts.   For any non-urgent questions, you may also contact your provider using MyChart. We now offer e-Visits for anyone 73 and older to request care online for non-urgent symptoms. For details visit [email or web address].   Also download the MyChart app! Go to the app store, search "MyChart", open the app, select [facility], and log in with your MyChart username and password.

## 2024-08-09 ENCOUNTER — Encounter: Payer: Self-pay | Admitting: Cardiology

## 2024-08-15 ENCOUNTER — Ambulatory Visit: Attending: Student in an Organized Health Care Education/Training Program

## 2024-08-15 DIAGNOSIS — I442 Atrioventricular block, complete: Secondary | ICD-10-CM | POA: Diagnosis not present

## 2024-08-16 LAB — CUP PACEART REMOTE DEVICE CHECK
Battery Remaining Longevity: 95 mo
Battery Remaining Percentage: 95.5 %
Battery Voltage: 3.02 V
Brady Statistic AP VP Percent: 32 %
Brady Statistic AP VS Percent: 1 %
Brady Statistic AS VP Percent: 68 %
Brady Statistic AS VS Percent: 1 %
Brady Statistic RA Percent Paced: 31 %
Brady Statistic RV Percent Paced: 99 %
Date Time Interrogation Session: 20251229040028
Implantable Lead Connection Status: 753985
Implantable Lead Connection Status: 753985
Implantable Lead Implant Date: 20251122
Implantable Lead Implant Date: 20251122
Implantable Lead Location: 753859
Implantable Lead Location: 753860
Implantable Pulse Generator Implant Date: 20251122
Lead Channel Impedance Value: 440 Ohm
Lead Channel Impedance Value: 510 Ohm
Lead Channel Pacing Threshold Amplitude: 0.5 V
Lead Channel Pacing Threshold Amplitude: 0.75 V
Lead Channel Pacing Threshold Pulse Width: 0.5 ms
Lead Channel Pacing Threshold Pulse Width: 0.5 ms
Lead Channel Sensing Intrinsic Amplitude: 2.7 mV
Lead Channel Sensing Intrinsic Amplitude: 8.7 mV
Lead Channel Setting Pacing Amplitude: 0.75 V
Lead Channel Setting Pacing Amplitude: 3.5 V
Lead Channel Setting Pacing Pulse Width: 0.5 ms
Lead Channel Setting Sensing Sensitivity: 4 mV
Pulse Gen Model: 2272
Pulse Gen Serial Number: 8327492

## 2024-08-20 ENCOUNTER — Ambulatory Visit: Payer: Self-pay | Admitting: Student in an Organized Health Care Education/Training Program

## 2024-08-22 ENCOUNTER — Other Ambulatory Visit (HOSPITAL_COMMUNITY)

## 2024-08-22 ENCOUNTER — Inpatient Hospital Stay: Admitting: Adult Health

## 2024-08-22 ENCOUNTER — Inpatient Hospital Stay

## 2024-08-23 ENCOUNTER — Inpatient Hospital Stay (HOSPITAL_BASED_OUTPATIENT_CLINIC_OR_DEPARTMENT_OTHER): Admitting: Hematology and Oncology

## 2024-08-23 ENCOUNTER — Inpatient Hospital Stay: Attending: Oncology

## 2024-08-23 ENCOUNTER — Inpatient Hospital Stay: Admitting: Nurse Practitioner

## 2024-08-23 ENCOUNTER — Encounter: Payer: Self-pay | Admitting: Nurse Practitioner

## 2024-08-23 ENCOUNTER — Ambulatory Visit (HOSPITAL_BASED_OUTPATIENT_CLINIC_OR_DEPARTMENT_OTHER)
Admission: RE | Admit: 2024-08-23 | Discharge: 2024-08-23 | Disposition: A | Discharge status: Home / Self Care | Source: Ambulatory Visit | Attending: Adult Health | Admitting: Adult Health

## 2024-08-23 VITALS — BP 146/84 | HR 62 | Temp 98.3°F | Resp 18

## 2024-08-23 VITALS — BP 150/81 | HR 72 | Temp 98.2°F | Resp 17 | Wt 291.3 lb

## 2024-08-23 DIAGNOSIS — I10 Essential (primary) hypertension: Secondary | ICD-10-CM | POA: Insufficient documentation

## 2024-08-23 DIAGNOSIS — C50212 Malignant neoplasm of upper-inner quadrant of left female breast: Secondary | ICD-10-CM | POA: Diagnosis not present

## 2024-08-23 DIAGNOSIS — Z01818 Encounter for other preprocedural examination: Secondary | ICD-10-CM | POA: Insufficient documentation

## 2024-08-23 DIAGNOSIS — R530 Neoplastic (malignant) related fatigue: Secondary | ICD-10-CM | POA: Diagnosis not present

## 2024-08-23 DIAGNOSIS — G4730 Sleep apnea, unspecified: Secondary | ICD-10-CM | POA: Insufficient documentation

## 2024-08-23 DIAGNOSIS — E119 Type 2 diabetes mellitus without complications: Secondary | ICD-10-CM | POA: Insufficient documentation

## 2024-08-23 DIAGNOSIS — C7800 Secondary malignant neoplasm of unspecified lung: Secondary | ICD-10-CM

## 2024-08-23 DIAGNOSIS — G4700 Insomnia, unspecified: Secondary | ICD-10-CM | POA: Diagnosis not present

## 2024-08-23 DIAGNOSIS — C50919 Malignant neoplasm of unspecified site of unspecified female breast: Secondary | ICD-10-CM

## 2024-08-23 DIAGNOSIS — Z79811 Long term (current) use of aromatase inhibitors: Secondary | ICD-10-CM | POA: Diagnosis not present

## 2024-08-23 DIAGNOSIS — I358 Other nonrheumatic aortic valve disorders: Secondary | ICD-10-CM | POA: Insufficient documentation

## 2024-08-23 DIAGNOSIS — I77810 Thoracic aortic ectasia: Secondary | ICD-10-CM | POA: Insufficient documentation

## 2024-08-23 DIAGNOSIS — Z515 Encounter for palliative care: Secondary | ICD-10-CM | POA: Diagnosis not present

## 2024-08-23 DIAGNOSIS — Z170 Estrogen receptor positive status [ER+]: Secondary | ICD-10-CM | POA: Insufficient documentation

## 2024-08-23 DIAGNOSIS — Z0189 Encounter for other specified special examinations: Secondary | ICD-10-CM

## 2024-08-23 DIAGNOSIS — Z87891 Personal history of nicotine dependence: Secondary | ICD-10-CM | POA: Diagnosis not present

## 2024-08-23 DIAGNOSIS — R531 Weakness: Secondary | ICD-10-CM

## 2024-08-23 DIAGNOSIS — F419 Anxiety disorder, unspecified: Secondary | ICD-10-CM | POA: Diagnosis not present

## 2024-08-23 DIAGNOSIS — Z5112 Encounter for antineoplastic immunotherapy: Secondary | ICD-10-CM | POA: Diagnosis present

## 2024-08-23 DIAGNOSIS — Z950 Presence of cardiac pacemaker: Secondary | ICD-10-CM | POA: Insufficient documentation

## 2024-08-23 DIAGNOSIS — J449 Chronic obstructive pulmonary disease, unspecified: Secondary | ICD-10-CM | POA: Insufficient documentation

## 2024-08-23 DIAGNOSIS — K5900 Constipation, unspecified: Secondary | ICD-10-CM

## 2024-08-23 DIAGNOSIS — M549 Dorsalgia, unspecified: Secondary | ICD-10-CM | POA: Diagnosis not present

## 2024-08-23 LAB — ECHOCARDIOGRAM COMPLETE
Area-P 1/2: 3.02 cm2
Calc EF: 64.2 %
S' Lateral: 3.3 cm
Single Plane A2C EF: 65.2 %
Single Plane A4C EF: 63.8 %
Weight: 4660.8 [oz_av]

## 2024-08-23 MED ORDER — ACETAMINOPHEN 325 MG PO TABS
650.0000 mg | ORAL_TABLET | Freq: Once | ORAL | Status: AC
  Administered 2024-08-23: 650 mg via ORAL
  Filled 2024-08-23: qty 2

## 2024-08-23 MED ORDER — LORAZEPAM 2 MG/ML IJ SOLN
0.5000 mg | Freq: Once | INTRAMUSCULAR | Status: AC
  Administered 2024-08-23: 0.5 mg via INTRAVENOUS
  Filled 2024-08-23: qty 1

## 2024-08-23 MED ORDER — SODIUM CHLORIDE 0.9 % IV SOLN: Freq: Once | INTRAVENOUS | Status: AC

## 2024-08-23 MED ORDER — SODIUM CHLORIDE 0.9% FLUSH: 10.0000 mL | INTRAVENOUS | Status: DC | PRN

## 2024-08-23 MED ORDER — DIPHENHYDRAMINE HCL 25 MG PO CAPS
50.0000 mg | ORAL_CAPSULE | Freq: Once | ORAL | Status: AC
  Administered 2024-08-23: 50 mg via ORAL
  Filled 2024-08-23: qty 2

## 2024-08-23 MED ORDER — DIAZEPAM 5 MG PO TABS: 5.0000 mg | ORAL_TABLET | Freq: Two times a day (BID) | ORAL | 0 refills | Status: AC | PRN

## 2024-08-23 MED ORDER — TRASTUZUMAB-ANNS CHEMO 150 MG IV SOLR
750.0000 mg | Freq: Once | INTRAVENOUS | Status: AC
  Administered 2024-08-23: 750 mg via INTRAVENOUS
  Filled 2024-08-23: qty 35.71

## 2024-08-23 NOTE — Progress Notes (Signed)
 "    Palliative Medicine [facility]  Telephone:([phone] Fax:(336) [phone]   Name: [name] Date: 08/23/2024 MRN: [id number]  DOB: 1950/11/28  Patient Care Team: [name], MD as PCP - General (Family Medicine) Almetta Donnice LABOR, MD as PCP - Cardiology (Cardiology) Rolan Ezra RAMAN, MD as Consulting Physician (Cardiology) Loretha Ash, MD as Consulting Physician (Hematology and Oncology)    INTERVAL HISTORY: [name] is a 74 y.o. female with medical history including metastatic breast cancer with numerous pulmonary nodules bilaterally s/p chemotherapy currently on herceptin  for maintenance. Now with complaints of uncontrolled back pain.  Palliative ask to see for symptom management.    SOCIAL HISTORY:     reports that she quit smoking about 56 years ago. Her smoking use included cigarettes. She started smoking about 61 years ago. She has a 15 pack-year smoking history. She has been exposed to tobacco smoke. She has never used smokeless tobacco. She reports current drug use. Drug: Marijuana. She reports that she does not drink alcohol.  ADVANCE DIRECTIVES:  None on file  CODE STATUS: Full code  PAST MEDICAL HISTORY: Past Medical History:  Diagnosis Date   Arthritis    Back pain    Breast cancer (HCC) dx'd 11/2012   left   Chest pain    COPD (chronic obstructive pulmonary disease) (HCC)    Diabetes mellitus without complication (HCC) 09/28/2014   Ear pain    Fatty liver 6/03   Hypertension    Lung disease    Lung metastases dx'd 11/2012   Obesity, unspecified    Other abnormal glucose    Suicide attempt (HCC) 1996   Syncope and collapse    Unspecified sleep apnea     ALLERGIES:  is allergic to meperidine hcl, penicillins, and aspirin .  MEDICATIONS:  Current Outpatient Medications  Medication Sig Dispense Refill   acetaminophen  (TYLENOL ) 500 MG tablet Take 500 mg by mouth every 6 (six) hours as needed for moderate pain (pain  score 4-6).     albuterol  (VENTOLIN  HFA) 108 (90 Base) MCG/ACT inhaler INHALE 2 PUFFS INTO THE LUNGS EVERY 6 HOURS AS NEEDED FOR WHEEZE 8.5 each 1   diazepam  (VALIUM ) 5 MG tablet Take 1 tablet (5 mg total) by mouth every 12 (twelve) hours as needed for anxiety. 30 tablet 0   insulin  glargine (LANTUS  SOLOSTAR) 100 UNIT/ML Solostar Pen Inject 20 Units into the skin daily. 15 mL 6   insulin  lispro (HUMALOG ) 100 UNIT/ML injection Inject 8-10 Units into the skin 3 (three) times daily before meals.     letrozole  (FEMARA ) 2.5 MG tablet TAKE 1 TABLET BY MOUTH EVERY DAY 90 tablet 12   loperamide  (IMODIUM  A-D) 2 MG tablet Take 2 mg by mouth daily as needed for diarrhea or loose stools.     nitroGLYCERIN  (NITROSTAT ) 0.4 MG SL tablet Place 0.4 mg under the tongue every 5 (five) minutes as needed for chest pain.     ondansetron  (ZOFRAN ) 8 MG tablet TAKE 1 TABLET BY MOUTH EVERY 8 HOURS AS NEEDED 20 tablet 2   Semaglutide , 2 MG/DOSE, (OZEMPIC , 2 MG/DOSE,) 8 MG/3ML SOPN Inject 2 mg into the skin every 7 (seven) days. 9 mL 1   No current facility-administered medications for this visit.   Facility-Administered Medications Ordered in Other Visits  Medication Dose Route Frequency Provider Last Rate Last Admin   sodium chloride  flush (NS) 0.9 % injection 10 mL  10 mL Intracatheter PRN Iruku, Praveena, MD  VITAL SIGNS: There were no vitals taken for this visit. There were no vitals filed for this visit.   Estimated body mass index is 48.47 kg/m as calculated from the following:   Height as of 07/25/24: 5' 5 (1.651 m).   Weight as of an earlier encounter on 08/23/24: 291 lb 4.8 oz (132.1 kg).   PERFORMANCE STATUS (ECOG) : 1 - Symptomatic but completely ambulatory  Physical Exam General: NAD Cardiovascular: regular rate and rhythm Pulmonary: normal breathing pattern Extremities: no edema, no joint deformities Skin: no rashes Neurological: AAO x3  IMPRESSION: Discussed the use of AI scribe  software for clinical note transcription with the patient, who gave verbal consent to proceed.  History of Present Illness [name] is a 74 year old female with metastatic breast cancer who was seen during infusion for symptom management follow-up.  No acute distress noted.  Patient continues to take things one day at a time.  Reports ongoing fatigue.  Denies concerns with nausea.   Appetite is fair.  Some days are better than others.   Patient shares recent challenges with constipation flaring up her hemorrhoids. States at times she is challenged with finding a balance with use of Miralax  that causes loose stools at times.    No uncontrolled symptoms at this time. Continues on diazepam  as needed for anxiety and sleep. No changes to current regimen at this time. We will continue to closely support and follow as needed.   Goals of care Ms. Wahba she has concerns that her body may be starting to physically show decline and that she will no longer be able to tolerate significant treatments in the future.  States she is questioning the need for further scans as she does not feel she will do anything differently based on the imaging results.  We discussed the purpose of echocardiogram and CT scans to assist the oncologist with directing further treatment paths.  Criss states she is not a candidate for any surgical procedures and I am sure that she will consider any additional options outside of her current regimen specifically stating she is not interested in pursuing radiation if ever cancer will still progress.  She empathetically approach discussions regarding her healthcare limitations and wishes.  Patient is clear and expressed wishes to continue to treat the treatable allow her every opportunity to continue to thrive as she has no desire to give up at this time.  She does however states in the event of further health decline her desire would be for natural death with no life-sustaining measures.    I discussed the importance of continued conversation with family and their medical providers regarding overall plan of care and treatment options, ensuring decisions are within the context of the patients values and GOCs. Assessment & Plan Anxiety/insomnia Patient reports anxiety is controlled with the Valium  however she is still challenged with insomnia. Previous melatonin and Tylenol  PM trials ineffective or adverse. - Consider trial of Unisom. - Evaluate medication regimen for sleep improvement. -Valium  5 mg twice daily as needed.  Cancer-related fatigue and weakness Chronic fatigue and weakness from prolonged cancer treatment, causing mobility issues and falls. Insurance limits home health access.   Goals of Care She prefers natural disease progression without aggressive interventions, avoiding unnecessary tests or treatments.  - Clear and expressed wishes to continue to treat the treatable allow her every opportunity to continue to thrive.  Patient's speech specifically if cancer was to progress she would not be interested in pursuing further  radiation therapy or more extensive treatments. - Expresses wishes for natural death with no life-sustaining measures when her time comes.  Follow-up -See patient back in the clinic in 4-6 weeks.  Sooner if needed.  Patient expressed understanding and was in agreement with this plan. She also understands that She can call the clinic at any time with any questions, concerns, or complaints.   Any controlled substances utilized were prescribed in the context of palliative care. PDMP has been reviewed.   Visit consisted of counseling and education dealing with the complex and emotionally intense issues of symptom management and palliative care in the setting of serious and potentially life-threatening illness.  Levon Borer, AGPCNP-BC  Palliative Medicine Team/[facility]    "

## 2024-08-23 NOTE — Progress Notes (Signed)
 Remote PPM Transmission

## 2024-08-23 NOTE — Progress Notes (Unsigned)
 "  Patient Care Team: [name], MD as PCP - General (Family Medicine) Almetta Donnice LABOR, MD as PCP - Cardiology (Cardiology) Rolan Ezra RAMAN, MD as Consulting Physician (Cardiology) Loretha Ash, MD as Consulting Physician (Hematology and Oncology)  DIAGNOSIS:  No diagnosis found.   SUMMARY OF ONCOLOGIC HISTORY: 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 (HCC)  12/14/2012 Initial Diagnosis   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 Magrinat     05/23/2013 Initial Diagnosis   Malignant neoplasm of upper-inner quadrant of left breast in female, estrogen receptor positive (HCC)     CHIEF COMPLIANT:   HISTORY OF PRESENT ILLNESS:  History of Present Illness  [name] is a 74 year old female with estrogen receptor positive breast cancer metastatic to the lung who presents for oncology follow-up to assess ongoing management and address recent cardiac and musculoskeletal complications.     TONICA BRASINGTON is a 74 year old female with metastatic breast cancer to the lung who presents for hematology/oncology follow-up after recent hospitalization for acute hypoxemia.  Since her hospitalization, she has not experienced any recurrent hypoxemia or new symptoms. She currently feels well and denies respiratory or cardiovascular complaints. Discussed the use of AI scribe software for clinical note transcription with the  patient, who gave verbal consent to proceed.  History of Present Illness      ALLERGIES:  is allergic to meperidine hcl, penicillins, and aspirin .  MEDICATIONS:  Current Outpatient Medications  Medication Sig Dispense Refill   acetaminophen  (TYLENOL ) 500 MG tablet Take 500 mg by mouth every 6 (six) hours as needed for moderate pain (pain score 4-6).     albuterol  (VENTOLIN  HFA) 108 (90 Base) MCG/ACT inhaler INHALE 2 PUFFS INTO THE LUNGS EVERY 6 HOURS AS NEEDED FOR WHEEZE 8.5 each 1   diazepam  (VALIUM ) 5 MG tablet Take 1 tablet (5 mg total) by mouth every 12 (twelve) hours as needed for anxiety. 30 tablet 0   insulin  glargine (LANTUS  SOLOSTAR) 100 UNIT/ML Solostar Pen Inject 20 Units into the skin daily. 15 mL 6   insulin  lispro (HUMALOG ) 100 UNIT/ML injection Inject 8-10 Units into the skin 3 (three) times daily before meals.     letrozole  (FEMARA ) 2.5 MG tablet TAKE 1 TABLET BY MOUTH EVERY DAY 90 tablet 12   loperamide  (IMODIUM  A-D) 2 MG tablet Take 2 mg by mouth daily as needed for diarrhea or loose stools.     nitroGLYCERIN  (NITROSTAT ) 0.4 MG SL tablet Place 0.4 mg under the tongue every 5 (five) minutes as needed for chest pain.     ondansetron  (ZOFRAN ) 8 MG tablet TAKE 1 TABLET BY MOUTH EVERY 8 HOURS AS NEEDED 20 tablet 2   Semaglutide , 2 MG/DOSE, (OZEMPIC , 2 MG/DOSE,) 8 MG/3ML SOPN Inject 2 mg into the skin every 7 (seven) days. 9 mL 1   No current facility-administered medications for this visit.    PHYSICAL EXAMINATION: ECOG PERFORMANCE STATUS: 1 - Symptomatic but completely  ambulatory  Vitals:   08/23/24 1311  BP: (!) 155/65  Pulse: 72  Resp: 17  Temp: 98.2 F (36.8 C)  SpO2: 94%   Filed Weights   08/23/24 1311  Weight: 291 lb 4.8 oz (132.1 kg)    Physical Exam GENERAL: Alert, cooperative, well developed, no acute distress HEENT: Normocephalic, normal oropharynx, moist mucous membranes CHEST: Clear to auscultation bilaterally, No wheezes, rhonchi, or  crackles CARDIOVASCULAR: Normal heart rate and rhythm, S1 and S2 normal without murmurs ABDOMEN: Soft, non-tender, non-distended, without organomegaly, Normal bowel sounds EXTREMITIES: No cyanosis or edema NEUROLOGICAL: Cranial nerves grossly intact, Moves all extremities without gross motor or sensory deficit  (exam performed in the presence of a chaperone)  LABORATORY DATA:  I have reviewed the data as listed    Latest Ref Rng & Units 07/09/2024   12:51 AM 07/08/2024   10:32 PM 07/08/2024    6:13 PM  CMP  Glucose 70 - 99 mg/dL 873  850  809   BUN 8 - 23 mg/dL 19  19  21    Creatinine 0.44 - 1.00 mg/dL 9.19  9.16  9.23   Sodium 135 - 145 mmol/L 143  141  140   Potassium 3.5 - 5.1 mmol/L 4.4  3.9  3.8   Chloride 98 - 111 mmol/L 105  101  103   CO2 22 - 32 mmol/L 22  29  28    Calcium 8.9 - 10.3 mg/dL 9.1  9.0  9.3   Total Protein 6.5 - 8.1 g/dL  6.5  7.0   Total Bilirubin 0.0 - 1.2 mg/dL  0.9  0.5   Alkaline Phos 38 - 126 U/L  73  91   AST 15 - 41 U/L  34  38   ALT 0 - 44 U/L  38  41     Lab Results  Component Value Date   WBC 7.2 07/09/2024   HGB 12.1 07/09/2024   HCT 37.1 07/09/2024   MCV 87.7 07/09/2024   PLT 192 07/09/2024   NEUTROABS 4.4 07/08/2024    ASSESSMENT & PLAN:  Malignant neoplasm of upper-inner quadrant of left breast in female, estrogen receptor positive (HCC) 12/15/23: Metastatic breast cancer with lung mets Current treatment: Herceptin  cycle 22 and letrozole   Toxicities: Other than the cardiac issues she is tolerating the treatment fairly well.  She had a pacemaker implantation around Thanksgiving 2025.     No orders of the defined types were placed in this encounter.     Amber Stalls, MD 08/23/2024    "

## 2024-08-24 ENCOUNTER — Encounter: Payer: Self-pay | Admitting: Hematology and Oncology

## 2024-09-20 ENCOUNTER — Inpatient Hospital Stay: Admitting: Nurse Practitioner

## 2024-09-20 ENCOUNTER — Inpatient Hospital Stay

## 2024-09-20 ENCOUNTER — Telehealth: Payer: Self-pay | Admitting: Hematology and Oncology

## 2024-09-20 ENCOUNTER — Inpatient Hospital Stay: Admitting: Hematology and Oncology

## 2024-09-20 NOTE — Telephone Encounter (Signed)
 I spoke with patient and she requested to reschedule MD, infusion, and palliative care from 09/20/2024 to 09/27/2024 due to inclement weather.

## 2024-09-27 ENCOUNTER — Inpatient Hospital Stay: Attending: Oncology | Admitting: Hematology and Oncology

## 2024-09-27 ENCOUNTER — Inpatient Hospital Stay

## 2024-10-11 ENCOUNTER — Ambulatory Visit: Admitting: Physician Assistant

## 2024-10-18 ENCOUNTER — Inpatient Hospital Stay

## 2024-10-18 ENCOUNTER — Inpatient Hospital Stay: Admitting: Hematology and Oncology

## 2024-11-14 ENCOUNTER — Ambulatory Visit

## 2025-02-13 ENCOUNTER — Ambulatory Visit

## 2025-05-15 ENCOUNTER — Ambulatory Visit
